# Patient Record
Sex: Female | Born: 1960 | Race: White | Hispanic: No | Marital: Married | Smoking: Former smoker
Health system: Southern US, Community
[De-identification: ages and names within clinical notes are randomized; demographics above are authoritative.]

## PROBLEM LIST (undated history)

## (undated) DIAGNOSIS — R739 Hyperglycemia, unspecified: Secondary | ICD-10-CM

## (undated) DIAGNOSIS — I10 Essential (primary) hypertension: Secondary | ICD-10-CM

## (undated) DIAGNOSIS — I429 Cardiomyopathy, unspecified: Secondary | ICD-10-CM

## (undated) DIAGNOSIS — K631 Perforation of intestine (nontraumatic): Secondary | ICD-10-CM

## (undated) DIAGNOSIS — Z8742 Personal history of other diseases of the female genital tract: Secondary | ICD-10-CM

## (undated) DIAGNOSIS — K219 Gastro-esophageal reflux disease without esophagitis: Secondary | ICD-10-CM

## (undated) DIAGNOSIS — K5909 Other constipation: Secondary | ICD-10-CM

## (undated) DIAGNOSIS — M7989 Other specified soft tissue disorders: Secondary | ICD-10-CM

## (undated) DIAGNOSIS — R200 Anesthesia of skin: Secondary | ICD-10-CM

## (undated) DIAGNOSIS — M317 Microscopic polyangiitis: Secondary | ICD-10-CM

## (undated) DIAGNOSIS — E119 Type 2 diabetes mellitus without complications: Secondary | ICD-10-CM

## (undated) DIAGNOSIS — J9691 Respiratory failure, unspecified with hypoxia: Secondary | ICD-10-CM

## (undated) DIAGNOSIS — I7782 Antineutrophilic cytoplasmic antibody (ANCA) vasculitis: Secondary | ICD-10-CM

## (undated) DIAGNOSIS — E669 Obesity, unspecified: Secondary | ICD-10-CM

## (undated) DIAGNOSIS — R202 Paresthesia of skin: Secondary | ICD-10-CM

## (undated) DIAGNOSIS — G8929 Other chronic pain: Secondary | ICD-10-CM

## (undated) DIAGNOSIS — F418 Other specified anxiety disorders: Secondary | ICD-10-CM

## (undated) DIAGNOSIS — Z9181 History of falling: Secondary | ICD-10-CM

## (undated) DIAGNOSIS — J849 Interstitial pulmonary disease, unspecified: Secondary | ICD-10-CM

## (undated) DIAGNOSIS — K529 Noninfective gastroenteritis and colitis, unspecified: Secondary | ICD-10-CM

## (undated) DIAGNOSIS — K5720 Diverticulitis of large intestine with perforation and abscess without bleeding: Secondary | ICD-10-CM

## (undated) DIAGNOSIS — D72829 Elevated white blood cell count, unspecified: Secondary | ICD-10-CM

## (undated) DIAGNOSIS — R0489 Hemorrhage from other sites in respiratory passages: Secondary | ICD-10-CM

## (undated) DIAGNOSIS — M5441 Lumbago with sciatica, right side: Secondary | ICD-10-CM

## (undated) DIAGNOSIS — R5382 Chronic fatigue, unspecified: Secondary | ICD-10-CM

## (undated) DIAGNOSIS — R5381 Other malaise: Secondary | ICD-10-CM

## (undated) DIAGNOSIS — R042 Hemoptysis: Secondary | ICD-10-CM

## (undated) DIAGNOSIS — E876 Hypokalemia: Secondary | ICD-10-CM

## (undated) DIAGNOSIS — R0600 Dyspnea, unspecified: Secondary | ICD-10-CM

## (undated) DIAGNOSIS — I776 Arteritis, unspecified: Secondary | ICD-10-CM

## (undated) DIAGNOSIS — Z87891 Personal history of nicotine dependence: Secondary | ICD-10-CM

## (undated) DIAGNOSIS — D62 Acute posthemorrhagic anemia: Secondary | ICD-10-CM

## (undated) HISTORY — DX: Respiratory failure, unspecified with hypoxia: J96.91

## (undated) HISTORY — DX: Anesthesia of skin: R20.0

## (undated) HISTORY — DX: Dyspnea, unspecified: R06.00

## (undated) HISTORY — DX: Other constipation: K59.09

## (undated) HISTORY — DX: Hypokalemia: E87.6

## (undated) HISTORY — DX: Paresthesia of skin: R20.2

## (undated) HISTORY — DX: Hyperglycemia, unspecified: R73.9

## (undated) HISTORY — DX: Interstitial pulmonary disease, unspecified: J84.9

## (undated) HISTORY — DX: Perforation of intestine (nontraumatic): K63.1

## (undated) HISTORY — DX: Other specified soft tissue disorders: M79.89

## (undated) HISTORY — DX: Other malaise: R53.81

## (undated) HISTORY — DX: Arteritis, unspecified: I77.6

## (undated) HISTORY — DX: Lumbago with sciatica, right side: M54.41

## (undated) HISTORY — DX: Other chronic pain: G89.29

## (undated) HISTORY — DX: Cardiomyopathy, unspecified: I42.9

## (undated) HISTORY — DX: Personal history of other diseases of the female genital tract: Z87.42

## (undated) HISTORY — DX: Hemoptysis: R04.2

## (undated) HISTORY — DX: Acute posthemorrhagic anemia: D62

## (undated) HISTORY — PX: APPENDECTOMY: SHX54

## (undated) HISTORY — DX: Gastro-esophageal reflux disease without esophagitis: K21.9

## (undated) HISTORY — PX: BLADDER REPAIR: SHX76

## (undated) HISTORY — DX: Chronic fatigue, unspecified: R53.82

## (undated) HISTORY — DX: Antineutrophilic cytoplasmic antibody (ANCA) vasculitis: I77.82

## (undated) HISTORY — PX: TONSILLECTOMY: SUR1361

## (undated) HISTORY — DX: Hemorrhage from other sites in respiratory passages: R04.89

## (undated) HISTORY — DX: Elevated white blood cell count, unspecified: D72.829

---

## 1995-01-10 DIAGNOSIS — Z8742 Personal history of other diseases of the female genital tract: Secondary | ICD-10-CM

## 1995-01-10 HISTORY — PX: LAPAROSCOPY: SHX197

## 1995-01-10 HISTORY — DX: Personal history of other diseases of the female genital tract: Z87.42

## 1997-12-01 ENCOUNTER — Other Ambulatory Visit: Admission: RE | Admit: 1997-12-01 | Discharge: 1997-12-01 | Payer: Self-pay | Admitting: Obstetrics and Gynecology

## 1997-12-11 ENCOUNTER — Emergency Department (HOSPITAL_COMMUNITY): Admission: EM | Admit: 1997-12-11 | Discharge: 1997-12-11 | Payer: Self-pay | Admitting: Emergency Medicine

## 1997-12-12 ENCOUNTER — Encounter: Payer: Self-pay | Admitting: Emergency Medicine

## 1998-04-02 ENCOUNTER — Encounter: Payer: Self-pay | Admitting: *Deleted

## 1998-04-02 ENCOUNTER — Ambulatory Visit (HOSPITAL_COMMUNITY): Admission: RE | Admit: 1998-04-02 | Discharge: 1998-04-02 | Payer: Self-pay | Admitting: *Deleted

## 1998-04-26 ENCOUNTER — Other Ambulatory Visit: Admission: RE | Admit: 1998-04-26 | Discharge: 1998-04-26 | Payer: Self-pay | Admitting: Obstetrics and Gynecology

## 1998-05-11 ENCOUNTER — Ambulatory Visit: Admission: RE | Admit: 1998-05-11 | Discharge: 1998-05-11 | Payer: Self-pay | Admitting: Gynecologic Oncology

## 1998-06-01 ENCOUNTER — Ambulatory Visit: Admission: RE | Admit: 1998-06-01 | Discharge: 1998-06-01 | Payer: Self-pay | Admitting: Gynecologic Oncology

## 1998-07-06 ENCOUNTER — Encounter (INDEPENDENT_AMBULATORY_CARE_PROVIDER_SITE_OTHER): Payer: Self-pay | Admitting: Specialist

## 1998-07-06 ENCOUNTER — Ambulatory Visit (HOSPITAL_COMMUNITY): Admission: RE | Admit: 1998-07-06 | Discharge: 1998-07-06 | Payer: Self-pay | Admitting: Gynecologic Oncology

## 1998-08-17 ENCOUNTER — Ambulatory Visit: Admission: RE | Admit: 1998-08-17 | Discharge: 1998-08-17 | Payer: Self-pay | Admitting: Gynecologic Oncology

## 1998-09-09 ENCOUNTER — Observation Stay (HOSPITAL_COMMUNITY): Admission: RE | Admit: 1998-09-09 | Discharge: 1998-09-10 | Payer: Self-pay | Admitting: Urology

## 1999-10-11 ENCOUNTER — Other Ambulatory Visit: Admission: RE | Admit: 1999-10-11 | Discharge: 1999-10-11 | Payer: Self-pay | Admitting: Obstetrics and Gynecology

## 1999-11-07 ENCOUNTER — Ambulatory Visit (HOSPITAL_COMMUNITY): Admission: RE | Admit: 1999-11-07 | Discharge: 1999-11-07 | Payer: Self-pay | Admitting: Urology

## 2000-04-28 ENCOUNTER — Encounter (INDEPENDENT_AMBULATORY_CARE_PROVIDER_SITE_OTHER): Payer: Self-pay

## 2000-04-28 ENCOUNTER — Ambulatory Visit (HOSPITAL_COMMUNITY): Admission: RE | Admit: 2000-04-28 | Discharge: 2000-04-28 | Payer: Self-pay | Admitting: Obstetrics and Gynecology

## 2000-04-28 HISTORY — PX: LAPAROSCOPY: SHX197

## 2000-07-27 ENCOUNTER — Ambulatory Visit: Admission: RE | Admit: 2000-07-27 | Discharge: 2000-07-27 | Payer: Self-pay | Admitting: Internal Medicine

## 2000-07-27 ENCOUNTER — Encounter (INDEPENDENT_AMBULATORY_CARE_PROVIDER_SITE_OTHER): Payer: Self-pay | Admitting: Specialist

## 2000-12-17 ENCOUNTER — Encounter: Payer: Self-pay | Admitting: Otolaryngology

## 2000-12-17 ENCOUNTER — Ambulatory Visit (HOSPITAL_COMMUNITY): Admission: RE | Admit: 2000-12-17 | Discharge: 2000-12-17 | Payer: Self-pay | Admitting: Otolaryngology

## 2001-01-03 ENCOUNTER — Other Ambulatory Visit: Admission: RE | Admit: 2001-01-03 | Discharge: 2001-01-03 | Payer: Self-pay | Admitting: Obstetrics and Gynecology

## 2008-10-27 ENCOUNTER — Emergency Department (HOSPITAL_COMMUNITY): Admission: EM | Admit: 2008-10-27 | Discharge: 2008-10-27 | Payer: Self-pay | Admitting: Emergency Medicine

## 2010-05-27 NOTE — Op Note (Signed)
[facility]  Patient:    [name], [name]                      MRN: [id number] Proc. Date: 11/07/99 Adm. Date:  [id number] Attending:  Veverly Fells                           Operative Report  PREOPERATIVE DIAGNOSIS:  Bilateral vaginal suture eversion .  POSTOPERATIVE DIAGNOSIS:  Bilateral vaginal suture eversion.  PROCEDURE:  Excision, bilateral vaginal Prolene sutures.  SURGEON:  Sigmund I. Gaynelle Arabian, M.D.  ANESTHESIA:  General (LMA).  PREOPERATIVE PREPARATION:  After appropriate pre-anesthesia, the patient was brought to the operating room and placed on the operating table in a dorsal supine position.  She was then given general anesthesia, placed in the dorsolithotomy position where the pubis was prepped with Betadine solution and draped in the usual fashion.  DESCRIPTION OF PROCEDURE:  The identification of two Prolene sutures were identified, and the two portions of Prolene suture were noted to eject through the previous vaginal incision, approximately 1 cm.  These were grasped, extended, and cut at the base.  There was no evidence of any bleeding or suture.  The right side was cut in the same fashion.  There was minimal bleeding from the right side.  This was washed and was felt to not be worthy of needing to have a suture placed.  The patient was awakened and taken to recovery in good condition. DD:  11/07/99 TD:  11/07/99 Job: [id number]

## 2010-05-27 NOTE — Op Note (Signed)
[facility]  Patient:    [name], [name]                      MRN: [id number] Proc. Date: 07/27/00 Adm. Date:  [id number] Attending:  Arturo Morton CC:         Tammy R. Modena Morrow, M.D.   Operative Report  PROCEDURE:  Fiberoptic bronchoscopy, diagnostic, with lavage.  INDICATIONS FOR PROCEDURE:  [name] is a 50 year old white female smoker with refractory cough that has been unresponsive to outpatient measures. Because of the barking upper airway component to the cough, I thought initially it might be reflux related but she has not responded to high dose PPI therapy as well as heavy suppression. She has not been able, however, to quit smoking. Her chest x-ray and sinus CT scans have failed to reveal a cause for the cough. The patient therefore agreed to the procedure after a full discussion of the risks, benefits, and alternatives ______ in the office this past week. Please see dictated notes for further information.  DESCRIPTION OF PROCEDURE:  The right naris and oropharynx were liberally anesthetized with 1% lidocaine aerosol and the right naris was additionally prepared with 2% lidocaine jelly. Using a standard flexible fiberoptic bronchoscope, the right naris was easily cannulated with good visualization of her entire oropharynx and larynx. The cords moved normally. There was definite abnormality of the vocal cord with mild diffuse swelling and possibility early leukoplakia as well as the anterior portion of the left vocal cord. However, the airway was otherwise unremarkable.  Using an additional 1% lidocaine as needed, the entire tracheobronchial tree was explored bilaterally to the subsegmental level with the following findings:  There were no endobronchial lesions. There was no evidence of significant inflammation nor any excessive secretions.  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 I  suspect that this is related to both coughing and smoking. It is critical therefore that this patient commit to quit smoking before further pulmonary workup of her cough is considered and that she receive ENT follow-up within the next 6 to 12 weeks. The patient tolerated the procedure well. I did turn in samples for cytology to be completed but I did not feel any secretions were purulent and therefore I did not turn in any studies to the micro lab. DD:  07/27/00 TD:  07/27/00 Job: [id number]

## 2010-05-27 NOTE — Op Note (Signed)
[facility]  Patient:    [name], [name]                      MRN: [id number] Proc. Date: 04/28/00 Adm. Date:  [id number] Disc. Date: [id number] Attending:  Eber Hong Ii                           Operative Report  PREOPERATIVE DIAGNOSIS:       Menorrhagia.  Pelvic pain.  POSTOPERATIVE DIAGNOSIS:      Menorrhagia.  Pelvic adhesions.  OPERATION:                    Laparoscopy with lysis of adhesions, hysteroscopy, D&C.  SURGEON:                      Daleen Bo. Lyn Hollingshead, M.D.  ASSISTANT:  ANESTHESIA:                   General with endotracheal intubation, Loren Racer. Charlean Merl, M.D.  ESTIMATED BLOOD LOSS:         50 cc.  SORBITOL:                     I&Os even.  INDICATIONS:                  The patient is a 50 year old married white female, gravida 0, para 0, with increasing pelvic pain and menorrhagia. Details are dictated in the history and physical.  Laparoscopy, hysteroscopy, D&C is discussed with the patient.  Possible risks and complications are discussed including, but not limited to infection, bowel, bladder, or ureteral damage, bleeding requiring transfusion of blood products with possible transfusion reaction, HIV, and hepatitis acquisition, DVT, PE, pneumonia, referred shoulder pain, bruising, vaginal bleeding.  All questions are answered and consent is signed and on the chart.  FINDINGS:                     Upper abdomen appears grossly normal.  There are multiple adhesions of the omentum to the right anterior abdominal wall.  The uterus is normal in size and contour.  The left tube and ovary are normal. There are adhesions of the epiploica to the right ovary.  The right fallopian tube is densely adherent to the right ovary.  There is a single shallow pseudowindow formation medial to the right uterosacral ligament with no obvious endometriosis implants.  The uterine cavity is normal in contour with no abnormal vessels or  structures.  DESCRIPTION OF PROCEDURE:     The patient is taken to the operating room and placed in the dorsal supine position where general anesthesia is induced via endotracheal intubation.  She is then placed in the dorsal lithotomy position where she is prepped abdominally and vaginally.  The bladder is straight catheterized and she is draped in a sterile fashion.  Bivalve speculum was placed in the vagina.  The anterior cervical lip was grasped with a single tooth tenaculum and the cervix was gently and progressively dilated up to a 29 Pratt dilator.  Diagnostic hysteroscope was placed in the cervix and advanced under direct visualization using Sorbitol distending media.  The above findings were noted.  The hysteroscope was removed and sharp curettage is carried out.  The single tooth tenaculum is replaced with the Hulka tenaculum. Attention is then turned to the abdomen.  A  small infraumbilical incision is made.  The abdominal wall is grasped lateral to the umbilicus on either side with towel clips to properly elevate the abdomen.  A #12 disposable long laparoscopic trocar is placed on the first attempt without difficulty. Placement is verified with the laparoscope and no damage to the surrounding structures is noted.  The pneumoperitoneum is induced.  A small suprapubic incision is made and a 5 mm nondisposable trocar sleeve is placed under direct visualization without difficulty.  The above findings are noted.  The adhesions to the right anterior abdominal wall are taken down sharply without difficulty.  Minimal bipolar cautery is used to assure hemostasis on the anterior abdominal wall.  The adhesions to the right ovary are taken down sharply and bluntly without difficulty.  The base of the pseudowindow in the posterior cul-de-sac is ablated with bipolar cautery.  Copious irrigation is carried out and all returns as clear.  Interceed is backloaded through the laparoscope and  placed around the right ovary as well as on the site of adhesion dissection on the right anterior abdominal wall.  These are slightly moistened.  Excess fluid is removed.  The suprapubic trocar sleeve is removed. The pneumoperitoneum is reduced and no bleeding is noted from any site. The pneumoperitoneum is then completely removed and the umbilical trocar sleeve is removed.  The umbilical incision is closed with a 0 Vicryl in the deeper underlying layers with care being taken not to pick up any underlying structures.  The skin incisions are closed with subcuticular 3-0 Vicryl suture.  The incisions are injected with 0.5% plain Marcaine.  Hulka tenaculum is removed and no bleeding is noted.  All counts are correct.  The patient is awakened and taken to the recovery room in stable condition. DD:  04/28/00 TD:  04/30/00 Job: [id number]

## 2010-05-27 NOTE — H&P (Signed)
[facility]  Patient:    [name], [name]                        MRN: [id number] Adm. Date:  04/28/00 Attending:  Daleen Bo. Lyn Hollingshead, M.D.                         History and Physical  CHIEF COMPLAINT:              Heavy vaginal bleeding and pelvic pain.  HISTORY OF PRESENT ILLNESS:   This patient is a 50 year old married white female, G0, P0, with a known history of endometriosis diagnosed on laparoscopy in 1997.  She has progressively worsening pelvic pain and dysmenorrhea.  She also has increasingly heavy irregular vaginal bleeding.  Evaluation has included pelvic ultrasound on March 23, 2000, revealing a normal size uterus. Ovaries are normal as well.  A 6 mm endometrial stripe was noted.  The patients pain is becoming quite severe and is interfering with her work.  She has required narcotics for management.  After a discussion of the options of management, she is being admitted for laparoscopy, hysteroscopy, and D&C.  PAST MEDICAL HISTORY:         1. Recurrent vulvar intraepithelial neoplasia.                               2. Endometriosis.                               3. Depression with anxiety disorder.                               4. History of stress urinary incontinence.                               5. History of cervical spine trauma.                               6. History of constipation.                               7. History of hypertension.  PAST SURGICAL HISTORY:        1. Pubovaginal sling in 2000.                               2. Local excision of anterior vulvar lesion for                                  recurrent vulvar neoplasia in 2000.                               3. Laparoscopy with lysis of adhesions and                                  ablation of endometriosis in 1997.  4. Appendectomy and right ovarian cystectomy in                                  1991.  MEDICATIONS:                   1. Provera 20 mg daily.                               2. Effexor XR 75 mg daily.                               3. Ambien 10 mg q.h.s. p.r.n.  ALLERGIES:                    SULFA DRUGS.  SOCIAL HISTORY:               The patient smokes one to one and one-half packages of cigarettes a day, denies alcohol or drug abuse.  OBSTETRICAL HISTORY:          Negative.  FAMILY HISTORY:               Positive for adult onset diabetes mellitus in father, birth defect in cousin, chronic hypertension in father.  REVIEW OF SYSTEMS:            Negative except as above.  PHYSICAL EXAMINATION:  VITAL SIGNS:                  Height 5 feet 8 inches.  Weight 307 pounds. Blood pressure 136/80.  NECK:                         Without thyromegaly.  LUNGS:                        Clear to auscultation.  HEART:                        Regular rate and rhythm.  BACK:                         Without CVA tenderness.  BREASTS:                      Without mass, retraction, or discharge.  ABDOMEN:                      Obese, soft, nontender, without masses.  PELVIC:                       Vulva, vagina, cervix without lesion.  Uterus normal size.  Adnexa mildly tender without masses bilaterally.  Exam compromised by patient habitus.  EXTREMITIES:                  Grossly within normal limits.  NEUROLOGIC:                   Grossly within normal limits.  ASSESSMENT:                   Endometriosis with pelvic pain and dysmenorrhea and menometrorrhagia.  PLAN:  Hysteroscopy, D&C, and laparoscopy. DD:  04/26/00 TD:  04/26/00 Job: [id number]

## 2012-09-17 ENCOUNTER — Emergency Department (HOSPITAL_COMMUNITY)
Admission: EM | Admit: 2012-09-17 | Discharge: 2012-09-17 | Disposition: A | Discharge status: Home / Self Care | Payer: 59 | Attending: Emergency Medicine | Admitting: Emergency Medicine

## 2012-09-17 ENCOUNTER — Emergency Department (HOSPITAL_COMMUNITY): Payer: 59

## 2012-09-17 ENCOUNTER — Encounter (HOSPITAL_COMMUNITY): Payer: Self-pay | Admitting: Emergency Medicine

## 2012-09-17 DIAGNOSIS — K529 Noninfective gastroenteritis and colitis, unspecified: Secondary | ICD-10-CM

## 2012-09-17 DIAGNOSIS — F411 Generalized anxiety disorder: Secondary | ICD-10-CM | POA: Insufficient documentation

## 2012-09-17 DIAGNOSIS — R61 Generalized hyperhidrosis: Secondary | ICD-10-CM | POA: Insufficient documentation

## 2012-09-17 DIAGNOSIS — E669 Obesity, unspecified: Secondary | ICD-10-CM | POA: Insufficient documentation

## 2012-09-17 DIAGNOSIS — R630 Anorexia: Secondary | ICD-10-CM | POA: Insufficient documentation

## 2012-09-17 DIAGNOSIS — R000 Tachycardia, unspecified: Secondary | ICD-10-CM | POA: Insufficient documentation

## 2012-09-17 DIAGNOSIS — Z79899 Other long term (current) drug therapy: Secondary | ICD-10-CM | POA: Insufficient documentation

## 2012-09-17 DIAGNOSIS — K5289 Other specified noninfective gastroenteritis and colitis: Secondary | ICD-10-CM | POA: Insufficient documentation

## 2012-09-17 DIAGNOSIS — F3289 Other specified depressive episodes: Secondary | ICD-10-CM | POA: Insufficient documentation

## 2012-09-17 DIAGNOSIS — K5900 Constipation, unspecified: Secondary | ICD-10-CM

## 2012-09-17 DIAGNOSIS — F329 Major depressive disorder, single episode, unspecified: Secondary | ICD-10-CM | POA: Insufficient documentation

## 2012-09-17 DIAGNOSIS — R112 Nausea with vomiting, unspecified: Secondary | ICD-10-CM | POA: Insufficient documentation

## 2012-09-17 DIAGNOSIS — R109 Unspecified abdominal pain: Secondary | ICD-10-CM | POA: Insufficient documentation

## 2012-09-17 LAB — CBC WITH DIFFERENTIAL/PLATELET
Eosinophils Absolute: 0.1 10*3/uL (ref 0.0–0.7)
HCT: 41 % (ref 36.0–46.0)
Lymphs Abs: 1.6 10*3/uL (ref 0.7–4.0)
MCHC: 32.9 g/dL (ref 30.0–36.0)
MCV: 78.1 fL (ref 78.0–100.0)
Monocytes Relative: 4 % (ref 3–12)
Platelets: 421 10*3/uL — ABNORMAL HIGH (ref 150–400)
RDW: 14 % (ref 11.5–15.5)
WBC: 19.5 10*3/uL — ABNORMAL HIGH (ref 4.0–10.5)

## 2012-09-17 LAB — COMPREHENSIVE METABOLIC PANEL
Alkaline Phosphatase: 134 U/L — ABNORMAL HIGH (ref 39–117)
BUN: 12 mg/dL (ref 6–23)
CO2: 22 mEq/L (ref 19–32)
Chloride: 94 mEq/L — ABNORMAL LOW (ref 96–112)
GFR calc Af Amer: >90 mL/min (ref >90)
GFR calc non Af Amer: 79 mL/min — ABNORMAL LOW (ref >90)
Glucose, Bld: 123 mg/dL — ABNORMAL HIGH (ref 70–99)
Potassium: 4.3 mEq/L (ref 3.5–5.1)
Total Bilirubin: 0.7 mg/dL (ref 0.3–1.2)
Total Protein: 8.4 g/dL — ABNORMAL HIGH (ref 6.0–8.3)

## 2012-09-17 LAB — LIPASE, BLOOD: Lipase: 11 U/L (ref 11–59)

## 2012-09-17 MED ORDER — IOHEXOL 300 MG/ML  SOLN
125.0000 mL | Freq: Once | INTRAMUSCULAR | Status: AC | PRN
  Administered 2012-09-17: 125 mL via INTRAVENOUS

## 2012-09-17 MED ORDER — LORAZEPAM 2 MG/ML IJ SOLN
1.0000 mg | Freq: Once | INTRAMUSCULAR | Status: AC
  Administered 2012-09-17: 1 mg via INTRAVENOUS
  Filled 2012-09-17: qty 1

## 2012-09-17 MED ORDER — CIPROFLOXACIN HCL 500 MG PO TABS: 500.0000 mg | ORAL_TABLET | Freq: Two times a day (BID) | ORAL | Status: DC

## 2012-09-17 MED ORDER — SODIUM CHLORIDE 0.9 % IV BOLUS (SEPSIS)
1000.0000 mL | Freq: Once | INTRAVENOUS | Status: AC
  Administered 2012-09-17: 1000 mL via INTRAVENOUS

## 2012-09-17 MED ORDER — HYDROMORPHONE HCL PF 1 MG/ML IJ SOLN
1.0000 mg | Freq: Once | INTRAMUSCULAR | Status: AC
  Administered 2012-09-17: 1 mg via INTRAVENOUS
  Filled 2012-09-17: qty 1

## 2012-09-17 MED ORDER — METRONIDAZOLE 500 MG PO TABS: 500.0000 mg | ORAL_TABLET | Freq: Two times a day (BID) | ORAL | Status: DC

## 2012-09-17 MED ORDER — MILK AND MOLASSES ENEMA
Freq: Once | RECTAL | Status: AC
  Administered 2012-09-17: 15:00:00 via RECTAL
  Filled 2012-09-17: qty 250

## 2012-09-17 MED ORDER — IOHEXOL 300 MG/ML  SOLN
50.0000 mL | Freq: Once | INTRAMUSCULAR | Status: AC | PRN
  Administered 2012-09-17: 50 mL via ORAL

## 2012-09-17 NOTE — ED Notes (Signed)
Pt c/o constipation since Aug 29.  Has been taking miralax with no relief.  Gen abd pain.  Pt has very detailed notes explaining her symptoms.  Very.  Detailed.

## 2012-09-17 NOTE — ED Notes (Signed)
Patient is menstruating heavily, urine sample must be collected through straight catheter.

## 2012-09-17 NOTE — ED Notes (Signed)
Patient transported to CT 

## 2012-09-17 NOTE — ED Provider Notes (Signed)
Medical screening examination/treatment/procedure(s) were performed by non-physician practitioner and as supervising physician I was immediately available for consultation/collaboration. Rolland Porter, MD, Abram Sander   Janice Norrie, MD 09/17/12 [id number]

## 2012-09-17 NOTE — Progress Notes (Signed)
Patient confirms her pcp is Dr. [name] of [facility].

## 2012-09-17 NOTE — ED Notes (Signed)
Patient transported to X-ray 

## 2012-09-17 NOTE — ED Notes (Signed)
IV insertion attempted without success. IV therapy paged.

## 2012-09-17 NOTE — ED Provider Notes (Signed)
CSN: [id number]     Arrival date & time 09/17/12  [id number] History   First MD Initiated Contact with Patient 09/17/12 1049     Chief Complaint  Patient presents with  . Constipation   (Consider location/radiation/quality/duration/timing/severity/associated sxs/prior Treatment) HPI Comments: 52 year old female the past history of anxiety and depression presents to the emergency department complaining of constipation times one and half weeks since August 29. She has tried multiple over-the-counter laxatives, stool softeners and enemas without relief. She went to her PCP who suggested laxatives. She's been trying to change her diet to eat more salad. Admits to associated generalized abdominal pain worsening over the past week and half. One week ago she changed her diet to a clear liquid diet, a few days ago became nauseous and began vomiting, has been unable to keep anything down. States she is very anxious and sweating. No recent abdominal surgeries. Also states she's had blood which she believes is from her rectum, however is on her menstrual period and states it is heavy.  Patient is a 52 y.o. female presenting with constipation. The history is provided by the patient.  Constipation Associated symptoms: abdominal pain, nausea and vomiting   Associated symptoms: no back pain and no fever     History reviewed. No pertinent past medical history. History reviewed. No pertinent past surgical history. History reviewed. No pertinent family history. History  Substance Use Topics  . Smoking status: Never Smoker   . Smokeless tobacco: Not on file  . Alcohol Use: No   OB History   Grav Para Term Preterm Abortions TAB SAB Ect Mult Living                 Review of Systems  Constitutional: Positive for diaphoresis and appetite change. Negative for fever and chills.  Gastrointestinal: Positive for nausea, vomiting, abdominal pain, constipation and blood in stool.  Musculoskeletal: Negative for back  pain.  All other systems reviewed and are negative.    Allergies  Sulfa antibiotics  Home Medications   Current Outpatient Rx  Name  Route  Sig  Dispense  Refill  . ALPRAZolam (XANAX) 1 MG tablet   Oral   Take 1 mg by mouth 3 (three) times daily as needed.          . bisacodyl (DULCOLAX) 5 MG EC tablet   Oral   Take 5 mg by mouth daily as needed for constipation.         Marland Kitchen buPROPion (WELLBUTRIN XL) 300 MG 24 hr tablet   Oral   Take 300 mg by mouth daily.          . citalopram (CELEXA) 20 MG tablet   Oral   Take 40 mg by mouth daily.         Marland Kitchen docusate sodium (COLACE) 100 MG capsule   Oral   Take 100 mg by mouth 2 (two) times daily as needed for constipation.         . ORTHO-CYCLEN, 28, 0.25-35 MG-MCG tablet   Oral   Take 1 tablet by mouth daily.          . phentermine 37.5 MG capsule   Oral   Take 37.5 mg by mouth every morning.         . polyethylene glycol (MIRALAX / GLYCOLAX) packet   Oral   Take 17 g by mouth daily as needed (constipation).         . Probiotic Product (ALIGN) 4 MG CAPS   Oral  Take 4 mg by mouth daily as needed (constipation).         . sodium phosphate (FLEET) enema   Rectal   Place 1 enema rectally once. follow package directions          BP 137/69  Pulse 111  Temp(Src) 98.7 F (37.1 C) (Oral)  Resp 16  SpO2 98%  LMP 09/04/2012 Physical Exam  Nursing note and vitals reviewed. Constitutional: She is oriented to person, place, and time. She appears well-developed. No distress.  Obese   HENT:  Head: Normocephalic and atraumatic.  Eyes: Conjunctivae are normal.  Neck: Normal range of motion. Neck supple.  Cardiovascular: Regular rhythm and normal heart sounds.  Tachycardia present.   Pulmonary/Chest: Effort normal and breath sounds normal. No respiratory distress.  Abdominal: Soft. Bowel sounds are normal. There is tenderness. There is guarding. There is no rigidity and no rebound.  Obese. Generalized  tenderness throughout abdomen, worse lower with guarding.  Genitourinary:  Menstrual blood noted around rectum. No gross blood on exam glove from rectal, however specimen contaminated by menstrual blood.  Musculoskeletal: Normal range of motion. She exhibits no edema.  Neurological: She is alert and oriented to person, place, and time.  Skin: Skin is warm. She is not diaphoretic.  Psychiatric: Her mood appears anxious.  Tearful    ED Course  Procedures (including critical care time) Labs Review Labs Reviewed  CBC WITH DIFFERENTIAL - Abnormal; Notable for the following:    WBC 19.5 (*)    RBC 5.25 (*)    MCH 25.7 (*)    Platelets 421 (*)    Neutrophils Relative % 87 (*)    Neutro Abs 17.0 (*)    Lymphocytes Relative 8 (*)    All other components within normal limits  COMPREHENSIVE METABOLIC PANEL - Abnormal; Notable for the following:    Sodium 131 (*)    Chloride 94 (*)    Glucose, Bld 123 (*)    Total Protein 8.4 (*)    Alkaline Phosphatase 134 (*)    GFR calc non Af Amer 79 (*)    All other components within normal limits  LIPASE, BLOOD   Imaging Review Ct Abdomen Pelvis W Contrast  09/17/2012   *RADIOLOGY REPORT*  Clinical Data: Abdominal pain, constipation, dysmenorrhea, prior appendectomy  CT ABDOMEN AND PELVIS WITH CONTRAST  Technique:  Multidetector CT imaging of the abdomen and pelvis was performed following the standard protocol during bolus administration of intravenous contrast.  Contrast: 125 ml Isovue 300 IV  Comparison: None.  Findings: Bases are clear.  Liver, spleen, pancreas, and adrenal glands within normal limits.  Gallbladder is mildly distended but otherwise unremarkable.  No intrahepatic or extrahepatic ductal dilatation.  Kidneys within normal limits.  No hydronephrosis.  No evidence of bowel obstruction.  Prior appendectomy.  Moderate colonic stool burden, predominately in the right colon and sigmoid. Mild mesenteric inflammatory stranding along the sigmoid  colon, worrisome for superimposed colitis.  No drainable fluid collection/abscess.  No free air.  No evidence of abdominal aortic aneurysm.  Small retroperitoneal lymph nodes which do not meet pathologic CT size criteria. Additional small nodes along the sigmoid mesocolon measuring up to 7 mm short axis.  Trace pelvic ascites.  Uterus and bilateral ovaries are unremarkable.  Bladder is within normal limits.  Mild degenerative changes of the visualized thoracolumbar spine.  IMPRESSION: Moderate colonic stool burden, suggesting constipation.  Mild pericolonic inflammatory stranding along the sigmoid colon, worrisome for superimposed colitis.  No drainable fluid  collection/abscess.  No free air.   Original Report Authenticated By: Julian Hy, M.D.   Dg Abd Acute W/chest  09/17/2012   *RADIOLOGY REPORT*  Clinical Data: Abdominal pain, constipation  ACUTE ABDOMEN SERIES (ABDOMEN 2 VIEW & CHEST 1 VIEW)  Comparison: None  Findings: Cardiomediastinal silhouette is unremarkable.  No acute infiltrate or pleural effusion.  No pulmonary edema.  There is nonobstructive bowel gas pattern.  Significant  stool noted in the right colon and transverse colon. Mild gaseous distended small bowel loops left abdomen probable mild ileus. No free abdominal air.  IMPRESSION: No acute disease within chest. No free abdominal air.  Significant stool is noted within the right colon and transverse colon.  Mild gaseous distended bowel loops left abdomen probable mild ileus.   Original Report Authenticated By: Lahoma Crocker, M.D.    MDM   1. Colitis   2. Constipation     Vision presenting with constipation, nausea and abdominal pain. She is afebrile, appears anxious, she presented with a long list of events over the past week. Labs obtained in triage prior to patient being seen, CBC, CMP and lipase. Leukocytosis of 19.5. Alkaline phosphatase 134. Generalized tenderness on exam. Acute abdomen series obtained due to concern for all  traction, significant stool noted, mild gaseous distended bowel loops in the left abdomen with a probable mild ileus. CT scan obtained, results showing moderate colonic stool burden, inflammatory stranding along sigmoid colon, consistent with colitis, no abscess or free air. After receiving pain medication, patient is no longer in distress, very talkative. No longer tachycardic.  Milk of molasses enema given with mild relief of her constipation. Patient is stable for discharge, and reevaluation of her abdomen, abdomen is soft, nontender nondistended. Discharged with Cipro and Flagyl for colitis, advised MiraLax and stool softeners for constipation. High fiber diet discussed. Return precautions discussed. Patient states understanding of plan and is agreeable  Illene Labrador, PA-C 09/17/12 1556

## 2012-09-23 ENCOUNTER — Emergency Department (HOSPITAL_COMMUNITY): Payer: 59

## 2012-09-23 ENCOUNTER — Inpatient Hospital Stay (HOSPITAL_COMMUNITY)
Admission: EM | Admit: 2012-09-23 | Discharge: 2012-10-05 | DRG: 394 | Disposition: A | Discharge status: Home / Self Care | Payer: 59 | Attending: General Surgery | Admitting: General Surgery

## 2012-09-23 ENCOUNTER — Encounter (HOSPITAL_COMMUNITY): Payer: Self-pay

## 2012-09-23 DIAGNOSIS — Z9181 History of falling: Secondary | ICD-10-CM

## 2012-09-23 DIAGNOSIS — Z6841 Body Mass Index (BMI) 40.0 and over, adult: Secondary | ICD-10-CM

## 2012-09-23 DIAGNOSIS — F418 Other specified anxiety disorders: Secondary | ICD-10-CM | POA: Diagnosis present

## 2012-09-23 DIAGNOSIS — R1032 Left lower quadrant pain: Secondary | ICD-10-CM | POA: Diagnosis present

## 2012-09-23 DIAGNOSIS — K219 Gastro-esophageal reflux disease without esophagitis: Secondary | ICD-10-CM | POA: Diagnosis present

## 2012-09-23 DIAGNOSIS — E46 Unspecified protein-calorie malnutrition: Secondary | ICD-10-CM | POA: Diagnosis present

## 2012-09-23 DIAGNOSIS — K631 Perforation of intestine (nontraumatic): Principal | ICD-10-CM | POA: Diagnosis present

## 2012-09-23 DIAGNOSIS — K5900 Constipation, unspecified: Secondary | ICD-10-CM

## 2012-09-23 DIAGNOSIS — F329 Major depressive disorder, single episode, unspecified: Secondary | ICD-10-CM | POA: Diagnosis present

## 2012-09-23 DIAGNOSIS — F411 Generalized anxiety disorder: Secondary | ICD-10-CM | POA: Diagnosis present

## 2012-09-23 DIAGNOSIS — K5909 Other constipation: Secondary | ICD-10-CM | POA: Diagnosis present

## 2012-09-23 DIAGNOSIS — Z8719 Personal history of other diseases of the digestive system: Secondary | ICD-10-CM

## 2012-09-23 DIAGNOSIS — R109 Unspecified abdominal pain: Secondary | ICD-10-CM

## 2012-09-23 DIAGNOSIS — K5732 Diverticulitis of large intestine without perforation or abscess without bleeding: Secondary | ICD-10-CM

## 2012-09-23 DIAGNOSIS — F3289 Other specified depressive episodes: Secondary | ICD-10-CM | POA: Diagnosis present

## 2012-09-23 DIAGNOSIS — E669 Obesity, unspecified: Secondary | ICD-10-CM | POA: Diagnosis present

## 2012-09-23 DIAGNOSIS — K529 Noninfective gastroenteritis and colitis, unspecified: Secondary | ICD-10-CM

## 2012-09-23 DIAGNOSIS — Z87891 Personal history of nicotine dependence: Secondary | ICD-10-CM

## 2012-09-23 DIAGNOSIS — R112 Nausea with vomiting, unspecified: Secondary | ICD-10-CM | POA: Diagnosis present

## 2012-09-23 DIAGNOSIS — K5720 Diverticulitis of large intestine with perforation and abscess without bleeding: Secondary | ICD-10-CM

## 2012-09-23 DIAGNOSIS — Z803 Family history of malignant neoplasm of breast: Secondary | ICD-10-CM

## 2012-09-23 HISTORY — DX: Noninfective gastroenteritis and colitis, unspecified: K52.9

## 2012-09-23 HISTORY — DX: Personal history of nicotine dependence: Z87.891

## 2012-09-23 HISTORY — DX: History of falling: Z91.81

## 2012-09-23 HISTORY — DX: Diverticulitis of large intestine with perforation and abscess without bleeding: K57.20

## 2012-09-23 HISTORY — DX: Obesity, unspecified: E66.9

## 2012-09-23 HISTORY — DX: Other specified anxiety disorders: F41.8

## 2012-09-23 LAB — URINALYSIS, ROUTINE W REFLEX MICROSCOPIC
Glucose, UA: NEGATIVE mg/dL
Protein, ur: NEGATIVE mg/dL
pH: 6 (ref 5.0–8.0)

## 2012-09-23 LAB — CBC WITH DIFFERENTIAL/PLATELET
Basophils Absolute: 0.1 10*3/uL (ref 0.0–0.1)
Basophils Relative: 0 % (ref 0–1)
Eosinophils Absolute: 0.2 10*3/uL (ref 0.0–0.7)
MCHC: 32.6 g/dL (ref 30.0–36.0)
Neutro Abs: 10.3 10*3/uL — ABNORMAL HIGH (ref 1.7–7.7)
Neutrophils Relative %: 78 % — ABNORMAL HIGH (ref 43–77)
Platelets: 419 10*3/uL — ABNORMAL HIGH (ref 150–400)
RDW: 13.7 % (ref 11.5–15.5)

## 2012-09-23 LAB — COMPREHENSIVE METABOLIC PANEL
AST: 15 U/L (ref 0–37)
Albumin: 3 g/dL — ABNORMAL LOW (ref 3.5–5.2)
Alkaline Phosphatase: 111 U/L (ref 39–117)
Chloride: 101 mEq/L (ref 96–112)
Creatinine, Ser: 0.71 mg/dL (ref 0.50–1.10)
Potassium: 4.1 mEq/L (ref 3.5–5.1)
Total Bilirubin: 0.2 mg/dL — ABNORMAL LOW (ref 0.3–1.2)
Total Protein: 7.8 g/dL (ref 6.0–8.3)

## 2012-09-23 MED ORDER — IOHEXOL 300 MG/ML  SOLN
50.0000 mL | Freq: Once | INTRAMUSCULAR | Status: AC | PRN
  Administered 2012-09-23: 50 mL via ORAL

## 2012-09-23 MED ORDER — KCL-LACTATED RINGERS-D5W 20 MEQ/L IV SOLN
INTRAVENOUS | Status: DC
  Administered 2012-09-23 – 2012-09-24 (×2): via INTRAVENOUS
  Administered 2012-09-24: 125 mL via INTRAVENOUS
  Administered 2012-09-25: 100 mL/h via INTRAVENOUS
  Administered 2012-09-25 – 2012-09-27 (×3): via INTRAVENOUS
  Filled 2012-09-23 (×12): qty 1000

## 2012-09-23 MED ORDER — IOHEXOL 300 MG/ML  SOLN
100.0000 mL | Freq: Once | INTRAMUSCULAR | Status: AC | PRN
  Administered 2012-09-23: 100 mL via INTRAVENOUS

## 2012-09-23 MED ORDER — CITALOPRAM HYDROBROMIDE 40 MG PO TABS
40.0000 mg | ORAL_TABLET | Freq: Every day | ORAL | Status: DC
  Administered 2012-09-24 – 2012-10-05 (×12): 40 mg via ORAL
  Filled 2012-09-23 (×13): qty 1

## 2012-09-23 MED ORDER — MORPHINE SULFATE 2 MG/ML IJ SOLN
2.0000 mg | INTRAMUSCULAR | Status: DC | PRN
  Administered 2012-09-23: 2 mg via INTRAVENOUS
  Administered 2012-09-24 – 2012-09-25 (×8): 4 mg via INTRAVENOUS
  Administered 2012-09-25: 2 mg via INTRAVENOUS
  Administered 2012-09-25 – 2012-09-26 (×11): 4 mg via INTRAVENOUS
  Administered 2012-09-27: 2 mg via INTRAVENOUS
  Administered 2012-09-27: 4 mg via INTRAVENOUS
  Administered 2012-09-27: 2 mg via INTRAVENOUS
  Administered 2012-09-27 (×3): 4 mg via INTRAVENOUS
  Administered 2012-09-27: 2 mg via INTRAVENOUS
  Administered 2012-09-27 – 2012-09-29 (×10): 4 mg via INTRAVENOUS
  Administered 2012-10-01: 2 mg via INTRAVENOUS
  Filled 2012-09-23 (×6): qty 2
  Filled 2012-09-23: qty 1
  Filled 2012-09-23 (×3): qty 2
  Filled 2012-09-23: qty 1
  Filled 2012-09-23 (×8): qty 2
  Filled 2012-09-23: qty 1
  Filled 2012-09-23 (×2): qty 2
  Filled 2012-09-23: qty 1
  Filled 2012-09-23 (×2): qty 2
  Filled 2012-09-23: qty 1
  Filled 2012-09-23 (×5): qty 2
  Filled 2012-09-23: qty 1
  Filled 2012-09-23: qty 2
  Filled 2012-09-23: qty 1
  Filled 2012-09-23: qty 2
  Filled 2012-09-23: qty 1
  Filled 2012-09-23 (×5): qty 2

## 2012-09-23 MED ORDER — ONDANSETRON HCL 4 MG/2ML IJ SOLN
4.0000 mg | Freq: Four times a day (QID) | INTRAMUSCULAR | Status: DC | PRN
  Administered 2012-09-24 – 2012-09-27 (×7): 4 mg via INTRAVENOUS
  Filled 2012-09-23 (×10): qty 2

## 2012-09-23 MED ORDER — BUPROPION HCL ER (XL) 300 MG PO TB24
300.0000 mg | ORAL_TABLET | Freq: Every day | ORAL | Status: DC
  Administered 2012-09-24 – 2012-10-05 (×12): 300 mg via ORAL
  Filled 2012-09-23 (×13): qty 1

## 2012-09-23 MED ORDER — PANTOPRAZOLE SODIUM 40 MG IV SOLR
40.0000 mg | Freq: Every day | INTRAVENOUS | Status: DC
  Administered 2012-09-23 – 2012-10-04 (×12): 40 mg via INTRAVENOUS
  Filled 2012-09-23 (×14): qty 40

## 2012-09-23 MED ORDER — SODIUM CHLORIDE 0.9 % IV BOLUS (SEPSIS)
1000.0000 mL | Freq: Once | INTRAVENOUS | Status: AC
  Administered 2012-09-23: 1000 mL via INTRAVENOUS

## 2012-09-23 MED ORDER — FENTANYL CITRATE 0.05 MG/ML IJ SOLN
50.0000 ug | Freq: Once | INTRAMUSCULAR | Status: AC
  Administered 2012-09-23: 50 ug via INTRAVENOUS
  Filled 2012-09-23: qty 2

## 2012-09-23 MED ORDER — ONDANSETRON HCL 4 MG/2ML IJ SOLN
4.0000 mg | Freq: Once | INTRAMUSCULAR | Status: AC
  Administered 2012-09-23: 4 mg via INTRAVENOUS
  Filled 2012-09-23: qty 2

## 2012-09-23 MED ORDER — SODIUM CHLORIDE 0.9 % IV SOLN
1.0000 g | INTRAVENOUS | Status: DC
  Administered 2012-09-23 – 2012-10-02 (×10): 1 g via INTRAVENOUS
  Filled 2012-09-23 (×11): qty 1

## 2012-09-23 MED ORDER — DOCUSATE SODIUM 100 MG PO CAPS
300.0000 mg | ORAL_CAPSULE | Freq: Two times a day (BID) | ORAL | Status: DC
  Administered 2012-09-23 – 2012-09-24 (×2): 300 mg via ORAL
  Filled 2012-09-23 (×3): qty 3

## 2012-09-23 MED ORDER — METRONIDAZOLE IN NACL 5-0.79 MG/ML-% IV SOLN
500.0000 mg | Freq: Once | INTRAVENOUS | Status: AC
  Administered 2012-09-23: 500 mg via INTRAVENOUS
  Filled 2012-09-23: qty 100

## 2012-09-23 MED ORDER — ENOXAPARIN SODIUM 40 MG/0.4ML ~~LOC~~ SOLN
40.0000 mg | SUBCUTANEOUS | Status: DC
  Administered 2012-09-23 – 2012-10-04 (×12): 40 mg via SUBCUTANEOUS
  Filled 2012-09-23 (×13): qty 0.4

## 2012-09-23 MED ORDER — CIPROFLOXACIN IN D5W 400 MG/200ML IV SOLN
400.0000 mg | Freq: Once | INTRAVENOUS | Status: AC
  Administered 2012-09-23: 400 mg via INTRAVENOUS
  Filled 2012-09-23: qty 200

## 2012-09-23 MED ORDER — HYDROMORPHONE HCL PF 1 MG/ML IJ SOLN
1.0000 mg | Freq: Once | INTRAMUSCULAR | Status: AC
  Administered 2012-09-23: 1 mg via INTRAVENOUS
  Filled 2012-09-23: qty 1

## 2012-09-23 MED ORDER — ALPRAZOLAM 1 MG PO TABS
1.0000 mg | ORAL_TABLET | Freq: Three times a day (TID) | ORAL | Status: DC | PRN
  Administered 2012-09-23 – 2012-10-04 (×12): 1 mg via ORAL
  Filled 2012-09-23 (×12): qty 1

## 2012-09-23 NOTE — Progress Notes (Signed)
Utilization Review completed.  Zykera Abella RN CM  

## 2012-09-23 NOTE — H&P (Signed)
Patient seen and examined.  This could be acute diverticulitis with contained microperforation, stercoral ulceration with contained microperforation or a possible partially obstructing neoplasm.   She has a significant colonic stool burden.  Will treat with bowel rest and broad spectrum antibiotics.  If this fails, would likely need a Hartmann procedure.  This has all been explained to her.

## 2012-09-23 NOTE — ED Notes (Signed)
Pt c/o abdominal pain, distention and constipation x "over 2 weeks," dysuria x 4 days and vaginal discharge since last night.  Pain score 10/10.  Pt was seen at [facility] 9/9 for same complaint.  Sts "I did everything they told me to do and I'm not any better.  I still havent had a bowel movement."  Vitals are stable.

## 2012-09-23 NOTE — ED Provider Notes (Signed)
CSN: [id number]     Arrival date & time 09/23/12  1038 History   First MD Initiated Contact with Patient 09/23/12 1259     Chief Complaint  Patient presents with  . Constipation  . Abdominal Pain   (Consider location/radiation/quality/duration/timing/severity/associated sxs/prior Treatment) Patient is a 52 y.o. female presenting with constipation and abdominal pain.  Constipation Associated symptoms: abdominal pain   Abdominal Pain Associated symptoms: constipation    Pt reports about 2 weeks of worsening diffuse abdominal pain and constipation. Has tried numerous OTC stool softeners and enemas with no relief. She was seen in the ED about a week ago for same and had a CT showing R sided stool burden and sigmoid inflammatory changes. She was given oral Abx which she has been trying to take but still having occasional vomiting. Now also having worsening pain, abdominal distention and difficulty urinating. She has spoken to staff at PCP office several times but has not been to his office yet. Denies fever. No blood in vomit. No blood in enema returns.   Past Medical History  Diagnosis Date  . Colitis    Past Surgical History  Procedure Laterality Date  . Appendectomy    . Tonsillectomy    . Bladder repair     History reviewed. No pertinent family history. History  Substance Use Topics  . Smoking status: Never Smoker   . Smokeless tobacco: Not on file  . Alcohol Use: No   OB History   Grav Para Term Preterm Abortions TAB SAB Ect Mult Living                 Review of Systems  Gastrointestinal: Positive for abdominal pain and constipation.   All other systems reviewed and are negative except as noted in HPI.   Allergies  Sulfa antibiotics  Home Medications   Current Outpatient Rx  Name  Route  Sig  Dispense  Refill  . ALPRAZolam (XANAX) 1 MG tablet   Oral   Take 1 mg by mouth 3 (three) times daily as needed for anxiety.          . bisacodyl (DULCOLAX) 5 MG EC  tablet   Oral   Take 5 mg by mouth daily as needed for constipation.         Marland Kitchen buPROPion (WELLBUTRIN XL) 300 MG 24 hr tablet   Oral   Take 300 mg by mouth daily.          . ciprofloxacin (CIPRO) 500 MG tablet   Oral   Take 1 tablet (500 mg total) by mouth 2 (two) times daily. One po bid x 7 days   14 tablet   0   . citalopram (CELEXA) 20 MG tablet   Oral   Take 40 mg by mouth daily.         Marland Kitchen docusate sodium (COLACE) 100 MG capsule   Oral   Take 300 mg by mouth 2 (two) times daily as needed for constipation.          . metroNIDAZOLE (FLAGYL) 500 MG tablet   Oral   Take 1 tablet (500 mg total) by mouth 2 (two) times daily. One po bid x 7 days   14 tablet   0   . ORTHO-CYCLEN, 28, 0.25-35 MG-MCG tablet   Oral   Take 1 tablet by mouth daily.          . phentermine 37.5 MG capsule   Oral   Take 37.5 mg by mouth every  morning.         . polyethylene glycol (MIRALAX / GLYCOLAX) packet   Oral   Take 17 g by mouth 2 (two) times daily as needed (constipation).          . sodium phosphate (FLEET) enema   Rectal   Place 1 enema rectally 2 (two) times daily as needed (for constipation).           BP 146/74  Pulse 104  Temp(Src) 98.4 F (36.9 C) (Oral)  Resp 18  SpO2 97%  LMP 09/17/2012 Physical Exam  Nursing note and vitals reviewed. Constitutional: She is oriented to person, place, and time. She appears well-developed and well-nourished.  Morbidly obese  HENT:  Head: Normocephalic and atraumatic.  Eyes: EOM are normal. Pupils are equal, round, and reactive to light.  Neck: Normal range of motion. Neck supple.  Cardiovascular: Normal rate, normal heart sounds and intact distal pulses.   Pulmonary/Chest: Effort normal and breath sounds normal.  Abdominal: Bowel sounds are normal. She exhibits no distension. There is tenderness (diffuse tenderness, worse in lower abdomen). There is guarding (LLQ). There is no rebound.  Musculoskeletal: Normal range of  motion. She exhibits no edema and no tenderness.  Neurological: She is alert and oriented to person, place, and time. She has normal strength. No cranial nerve deficit or sensory deficit.  Skin: Skin is warm and dry. No rash noted.  Psychiatric: She has a normal mood and affect.    ED Course  Procedures (including critical care time) Labs Review Labs Reviewed  CBC WITH DIFFERENTIAL - Abnormal; Notable for the following:    WBC 13.3 (*)    MCH 25.7 (*)    Platelets 419 (*)    Neutrophils Relative % 78 (*)    Neutro Abs 10.3 (*)    All other components within normal limits  COMPREHENSIVE METABOLIC PANEL - Abnormal; Notable for the following:    Glucose, Bld 143 (*)    Albumin 3.0 (*)    Total Bilirubin 0.2 (*)    All other components within normal limits  URINALYSIS, ROUTINE W REFLEX MICROSCOPIC   Imaging Review Ct Abdomen Pelvis W Contrast  09/23/2012   *RADIOLOGY REPORT*  Clinical Data: Colitis  CT ABDOMEN AND PELVIS WITH CONTRAST  Technique:  Multidetector CT imaging of the abdomen and pelvis was performed following the standard protocol during bolus administration of intravenous contrast.  Contrast: 29mL OMNIPAQUE IOHEXOL 300 MG/ML  SOLN, 168mL OMNIPAQUE IOHEXOL 300 MG/ML  SOLN  Comparison: 09/17/2012  Findings: Inflammatory changes of the sigmoid colon have worsened with wall thickening and stranding in the adjacent fat.  There is now an abnormal extraluminal collection of primarily gas and inflamed tissue posterior to the sigmoid colon measuring 4.8 x 4.3 cm on image 70.  There is no defined fluid collection for drainage.  At the mid to distal sigmoid colon on image 74, the wall is irregular.  Underlying lesion of the colon cannot be excluded.  Stable intra-abdominal organs.  Adenopathy in the mesentery of the sigmoid colon is more pronounced.  IMPRESSION: The inflammatory process has worsened.  There is now a collection of primarily gas and soft tissue density posterior to the inflamed  sigmoid colon measuring 4.3 x 4.8 cm.  This may represent sequelae of perforation or a developing abscess.  Beyond the inflamed colon, in the distal sigmoid colon, the wall is irregular and underlying adenocarcinoma is not excluded.  Correlate clinically as for the need for follow-up colonoscopy.  Original Report Authenticated By: Marybelle Killings, M.D.   Dg Abd Acute W/chest  09/23/2012   *RADIOLOGY REPORT*  Clinical Data: Constipation, abdominal pain and nausea  ACUTE ABDOMEN SERIES (ABDOMEN 2 VIEW & CHEST 1 VIEW)  Comparison: 09/17/2012 CT  Findings: Cardiomediastinal silhouette is within normal limits. The lungs are clear. No pleural effusion.  No pneumothorax.  No acute osseous abnormality.  No free air beneath the diaphragms.  Moderate volume of stool noted throughout nondilated colon with residual contrast from previous exam.  No dilated loop of bowel or abnormal air-fluid level.  No abnormal calcific opacity.  No acute osseous finding.  IMPRESSION: No acute cardiopulmonary process.  Nonobstructive bowel gas pattern.   Original Report Authenticated By: Conchita Paris, M.D.    MDM  No diagnosis found.  Pt with recent colitis causing what sounds like an adynamic ileus has failed outpatient treatment. Will recheck labs and CT to check for abscess formation but plan on admission for IV Abx and pain control.   4:00 PM Reviewed CT images with radiologist and discussed results with Dr. Barry Dienes on call for General Surgery. She will send her PA down to see the patient and decide if she needs surgery or not. Care signed out to Dr. Darl Householder at change of shift pending disposition.   Charles B. Karle Starch, MD 09/23/12 [phone]

## 2012-09-23 NOTE — H&P (Signed)
[name] is an 52 y.o. female.   PCP: DR. [name] Prior GI:   She thinks it was Dr. Velora Heckler,  has not seen for perhaps 20 years.  Chief Complaint: Abdominal pain, constipation since 09/06/12, some nausea and some vomiting.  HPI: Pt has a long history of chronic constipation and will normally go 1-3 days between bowel movements. In August she started having trouble near the end of August.  She reports the last BM on 8/29, she had some abdominal pain then and was concerned with lack of bowel movement.  She contacted Dr. Dois Davenport office.  She was at his office and placed on some Miralax, but another person suggested she try some dulcolax and stool softnesr also.   She has been doing both since 09/06/12.  She was seen in the ER on 09/17/12 with constipation, nausea and some vomiting.  WBC was elevated 19.5, CT scan showed some stranding, possible colitis. She was reportedly treated by the ER with Milk and Molasses enema.She was reported to have relief from her abdominal pain and constipation. She was discharged on Cipro and flagyl for her possible colitis.   Pt says she never got and enema and reports still no BM since 8/29.  She is having episodic pain, nausea and vomiting.  She has not taken her home medicines since 8/29, and has not been able to work since that time.  She eats some on and off, but cannot eat much before she has pain, then some nausea, with some occasional vomiting.  She says she does have some bleeding with constipation, and says she has had some recently without the constipation, she has just completed her period.  She returns today with nausea, some vomiting, abdominal pain, and ongoing constipation.   Labs show her WBC is 13.3, H/H is down slightly since her last on 9/9.  Her CMP is essentially normal aside from her low albumin.  CT scan today shows:  The inflammatory process has worsened. There is now a collection  of primarily gas and soft tissue density posterior to the inflamed  sigmoid colon measuring 4.3 x 4.8 cm. This may represent sequelae of perforation or a developing abscess.  Beyond the inflamed colon, in the distal sigmoid colon, the wall is irregular and underlying adenocarcinoma is not excluded.  We have been ask to see and admit.        Past Medical History  Diagnosis Date  . Colitis   . Obesity (BMI 30-39.9) 09/23/2012  . Anxiety associated with depression  She says she can have 2-4 panic attacks per week depending on stressor. 09/23/2012  . Hx of tobacco use, presenting hazards to health 09/23/2012  . H/O gastroesophageal reflux (GERD) 09/23/2012  . At high risk for falls 09/23/2012    She has had a fractured ankle and tendon tear with falls over the last couple years.  . Diverticulitis of large intestine with perforation 09/23/2012    Past Surgical History  Procedure Laterality Date  . Appendectomy    . Tonsillectomy    . Bladder repair      Family History:   Father with severe obesity, now bed ridden, AODM, Hypertension Mother living with AODM; caring for her husband. . 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 Sulfa Antibiotics  she does not seem  to be sure but said she may have had a hard time swelling.   Prior to Admission medications   Medication Sig Start Date End Date Taking? Authorizing Provider  ALPRAZolam Duanne Moron) 1 MG tablet Take 1 mg by mouth 3 (three) times daily as needed for anxiety.  08/30/12  Yes Historical Provider, MD  bisacodyl (DULCOLAX) 5 MG EC tablet Take 5 mg by mouth daily as needed for constipation.   Yes Historical Provider, MD  buPROPion (WELLBUTRIN XL) 300 MG 24 hr tablet Take 300 mg by mouth daily.  08/30/12  Yes Historical Provider, MD  ciprofloxacin (CIPRO) 500 MG tablet Take 1 tablet (500 mg total) by mouth 2 (two) times daily. One po  bid x 7 days 09/17/12  Yes Robyn Barrie Lyme, PA-C  citalopram (CELEXA) 20 MG tablet Take 40 mg by mouth daily.   Yes Historical Provider, MD  docusate sodium (COLACE) 100 MG capsule Take 300 mg by mouth 2 (two) times daily as needed for constipation.    Yes Historical Provider, MD  metroNIDAZOLE (FLAGYL) 500 MG tablet Take 1 tablet (500 mg total) by mouth 2 (two) times daily. One po bid x 7 days 09/17/12  Yes Robyn Barrie Lyme, PA-C  ORTHO-CYCLEN, 28, 0.25-35 MG-MCG tablet Take 1 tablet by mouth daily.  08/30/12  Yes Historical Provider, MD  phentermine 37.5 MG capsule Take 37.5 mg by mouth every morning.   Yes Historical Provider, MD  polyethylene glycol (MIRALAX / GLYCOLAX) packet Take 17 g by mouth 2 (two) times daily as needed (constipation).    Yes Historical Provider, MD  sodium phosphate (FLEET) enema Place 1 enema rectally 2 (two) times daily as needed (for constipation).     Historical Provider, MD     (Not in a hospital admission)  Results for orders placed during the hospital encounter of 09/23/12 (from the past 48 hour(s))  CBC WITH DIFFERENTIAL     Status: Abnormal   Collection Time    09/23/12  1:45 PM      Result Value Range   WBC 13.3 (*) 4.0 - 10.5 K/uL   RBC 4.70  3.87 - 5.11 MIL/uL   Hemoglobin 12.1  12.0 - 15.0 g/dL   HCT 37.1  36.0 - 46.0 %   MCV 78.9  78.0 - 100.0 fL   MCH 25.7 (*) 26.0 - 34.0 pg   MCHC 32.6  30.0 - 36.0 g/dL   RDW 13.7  11.5 - 15.5 %   Platelets 419 (*) 150 - 400 K/uL   Neutrophils Relative % 78 (*) 43 - 77 %   Neutro Abs 10.3 (*) 1.7 - 7.7 K/uL   Lymphocytes Relative 14  12 - 46 %   Lymphs Abs 1.9  0.7 - 4.0 K/uL   Monocytes Relative 6  3 - 12 %   Monocytes Absolute 0.8  0.1 - 1.0 K/uL   Eosinophils Relative 2  0 - 5 %   Eosinophils Absolute 0.2  0.0 - 0.7 K/uL   Basophils Relative 0  0 - 1 %   Basophils Absolute 0.1  0.0 - 0.1 K/uL  COMPREHENSIVE METABOLIC PANEL     Status: Abnormal   Collection Time    09/23/12  1:45 PM      Result Value Range    Sodium 138  135 - 145 mEq/L   Potassium 4.1  3.5 - 5.1 mEq/L   Chloride 101  96 - 112 mEq/L   CO2 23  19 - 32 mEq/L  Glucose, Bld 143 (*) 70 - 99 mg/dL   BUN 13  6 - 23 mg/dL   Creatinine, Ser 0.71  0.50 - 1.10 mg/dL   Calcium 9.3  8.4 - 10.5 mg/dL   Total Protein 7.8  6.0 - 8.3 g/dL   Albumin 3.0 (*) 3.5 - 5.2 g/dL   AST 15  0 - 37 U/L   ALT 9  0 - 35 U/L   Alkaline Phosphatase 111  39 - 117 U/L   Total Bilirubin 0.2 (*) 0.3 - 1.2 mg/dL   GFR calc non Af Amer >90  >90 mL/min   GFR calc Af Amer >90  >90 mL/min   Comment: (NOTE)     The eGFR has been calculated using the CKD EPI equation.     This calculation has not been validated in all clinical situations.     eGFR's persistently <90 mL/min signify possible Chronic Kidney     Disease.   Ct Abdomen Pelvis W Contrast  09/23/2012   *RADIOLOGY REPORT*  Clinical Data: Colitis  CT ABDOMEN AND PELVIS WITH CONTRAST  Technique:  Multidetector CT imaging of the abdomen and pelvis was performed following the standard protocol during bolus administration of intravenous contrast.  Contrast: 42mL OMNIPAQUE IOHEXOL 300 MG/ML  SOLN, 154mL OMNIPAQUE IOHEXOL 300 MG/ML  SOLN  Comparison: 09/17/2012  Findings: Inflammatory changes of the sigmoid colon have worsened with wall thickening and stranding in the adjacent fat.  There is now an abnormal extraluminal collection of primarily gas and inflamed tissue posterior to the sigmoid colon measuring 4.8 x 4.3 cm on image 70.  There is no defined fluid collection for drainage.  At the mid to distal sigmoid colon on image 74, the wall is irregular.  Underlying lesion of the colon cannot be excluded.  Stable intra-abdominal organs.  Adenopathy in the mesentery of the sigmoid colon is more pronounced.  IMPRESSION: The inflammatory process has worsened.  There is now a collection of primarily gas and soft tissue density posterior to the inflamed sigmoid colon measuring 4.3 x 4.8 cm.  This may represent sequelae of  perforation or a developing abscess.  Beyond the inflamed colon, in the distal sigmoid colon, the wall is irregular and underlying adenocarcinoma is not excluded.  Correlate clinically as for the need for follow-up colonoscopy.   Original Report Authenticated By: Marybelle Killings, M.D.   Dg Abd Acute W/chest  09/23/2012   *RADIOLOGY REPORT*  Clinical Data: Constipation, abdominal pain and nausea  ACUTE ABDOMEN SERIES (ABDOMEN 2 VIEW & CHEST 1 VIEW)  Comparison: 09/17/2012 CT  Findings: Cardiomediastinal silhouette is within normal limits. The lungs are clear. No pleural effusion.  No pneumothorax.  No acute osseous abnormality.  No free air beneath the diaphragms.  Moderate volume of stool noted throughout nondilated colon with residual contrast from previous exam.  No dilated loop of bowel or abnormal air-fluid level.  No abnormal calcific opacity.  No acute osseous finding.  IMPRESSION: No acute cardiopulmonary process.  Nonobstructive bowel gas pattern.   Original Report Authenticated By: Conchita Paris, M.D.    Review of Systems  Constitutional: Positive for chills (she says air conditioning may have just been to high) and weight loss. Negative for fever.  HENT: Negative.   Eyes: Eye discharge: She was over 350 at one time,  she was doing better, the last time she weighted she was up around 330's.       She wears glasses  Respiratory: Negative.   Cardiovascular: Positive for  chest pain (she had chest pain 2 years ago seen at [facility], but it was a panic attack) and leg swelling. Negative for palpitations, orthopnea, claudication and PND.  Gastrointestinal: Positive for heartburn (had allot when she was smoking, but since she quit it is much better.), nausea, abdominal pain (She complains of severe abdominal pain, all over, but worst in LLQ), constipation (chronic issue since she was a child.) and blood in stool (with constipation.  ). Negative for diarrhea. Vomiting: some on and off.       She  tried to eat yesterday, some wings friends brought her, but her abdominal pain got worse. She had some ice cream this AM.  Last BM was 09/06/12.  Genitourinary: Negative for dysuria, urgency, frequency, hematuria and flank pain.       She complains of abdominal pain with voiding now.  She has recently completed her menstrual cycle, and is on BCP to help with periods.  Musculoskeletal:       It chronically hurts to walk she attributes to weight.  She tolerates it.    Skin: Negative.   Neurological: Negative.   Endo/Heme/Allergies: Negative.   Psychiatric/Behavioral: Positive for depression. Hallucinations: .hmed. The patient is nervous/anxious.        She says she has some problems with panic attacks and it varies with her stressors.    Blood pressure 114/66, pulse 84, temperature 98.7 F (37.1 C), temperature source Oral, resp. rate 16, last menstrual period 09/17/2012, SpO2 98.00%. Physical Exam  Constitutional: She is oriented to person, place, and time. No distress.  Extremely anxious and frighten woman, but no acute distress.  Tearful, and already concerned about a colostomy. BMI 48.7 (pt. Reported height and weight) BP 128/70  Pulse 84  Temp(Src) 98.4 F (36.9 C) (Oral)  Resp 16  SpO2 99%  LMP 09/17/2012   HENT:  Head: Normocephalic and atraumatic.  Nose: Nose normal.  Eyes: Conjunctivae and EOM are normal. Pupils are equal, round, and reactive to light. Right eye exhibits no discharge. Left eye exhibits no discharge. No scleral icterus.  Neck: Normal range of motion. Neck supple. No JVD present. No tracheal deviation present. No thyromegaly present.  Cardiovascular: Normal rate, regular rhythm, normal heart sounds and intact distal pulses.  Exam reveals no gallop.   No murmur heard. Respiratory: Effort normal and breath sounds normal. No respiratory distress. She has no wheezes. She has no rales. She exhibits no tenderness.  GI: Soft. Bowel sounds are normal.  Morbidly  obese, she is tender all over, more in the LLQ than any place else. She describes tenderness  all over the abdomen and has some spasms of pain, that come and go, she points to lower abdomen for this.  Genitourinary:  I did a rectal exam, I did not feel any stool in the vault, there was a good deal of tenderness, but no blood.  I obtained and requested guacic.  Nothing on the finger that looked like new or old blood.  Musculoskeletal: She exhibits edema (trace lower legs).  Lymphadenopathy:    She has no cervical adenopathy.  Neurological: She is alert and oriented to person, place, and time. No cranial nerve deficit.  Skin: Skin is warm and dry. No rash noted. She is not diaphoretic. No erythema. No pallor.  Psychiatric:  She is tearful, extremely frighten and chronically anxious.  She is desperately afraid she will have to have a colostomy.   She has a hx of panic attacks, anxiety and depression.  Assessment/Plan 1.  Probable diverticulitis with perforation and possible abscess 2.  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 3. Chronic and ongoing constipation since 09/06/12.  She reports GI work-up and colonoscopy dating back to her teens. 4.  Severe anxiety with depression/panic attacks, off her medicines since 09/06/12. 5.  Long history of obesity, with weight loss in the past 6.  Family history of breast cancer  Plan:  We will admit and start her on antibiotics, hydration and bowel rest.  We will ask GI to see tomorrow, I will call [facility] and see if she was seen there in the past. Tienna Bienkowski 09/23/2012, 6:14 PM

## 2012-09-24 LAB — CBC
MCH: 25.5 pg — ABNORMAL LOW (ref 26.0–34.0)
MCHC: 32 g/dL (ref 30.0–36.0)
MCV: 79.7 fL (ref 78.0–100.0)
Platelets: 362 10*3/uL (ref 150–400)
RDW: 13.9 % (ref 11.5–15.5)

## 2012-09-24 LAB — BASIC METABOLIC PANEL
CO2: 25 mEq/L (ref 19–32)
Calcium: 8.8 mg/dL (ref 8.4–10.5)
Creatinine, Ser: 0.75 mg/dL (ref 0.50–1.10)
Glucose, Bld: 138 mg/dL — ABNORMAL HIGH (ref 70–99)

## 2012-09-24 NOTE — Progress Notes (Signed)
Chaplain provided emotional support with pt's mother.  Mother tearful and anxious about pt's surgery.  Mother also caring for pt's father who has multiple medical issues.   Will continue to follow and make contact with pt for support.   [place], [place]

## 2012-09-24 NOTE — Progress Notes (Signed)
Assisted patient out of bed to bathroom, voided qs, and walked the full length of [place] hallway.  Tolerated ambulation well, returned to room and sat in recliner.

## 2012-09-24 NOTE — Progress Notes (Signed)
Patient extremely stressed and upset regarding the possibility of having surgery tomorrow that will require her to have a colostomy.  She expresses her many concerns regarding the outcome of the surgery and what to expect long term post operative.  Discussed her concerns, attempted to answer her questions, and offered emotional support.

## 2012-09-24 NOTE — Progress Notes (Signed)
Subjective: She remains extremely tender, she has been seen by Dr. Barry Dienes.  She is also very anxious.   Objective: Vital signs in last 24 hours: Temp:  [97.7 F (36.5 C)-99.1 F (37.3 C)] 98.3 F (36.8 C) (09/16 0508) Pulse Rate:  [84-104] 87 (09/16 0508) Resp:  [16-20] 20 (09/16 0508) BP: (107-155)/(66-101) 107/72 mmHg (09/16 0508) SpO2:  [96 %-100 %] 96 % (09/16 0508) Weight:  [148.8 kg (328 lb 0.7 oz)] 148.8 kg (328 lb 0.7 oz) (09/15 2003) Last BM Date: 09/06/12 I/o=? Afebrile, VSS Labs OK this Am  Intake/Output from previous day: 09/15 0701 - 09/16 0700 In: 1337.5 [I.V.:1287.5; IV Piggyback:50] Out: -  Intake/Output this shift:    General appearance: alert, cooperative and very anxious and distressed emotionally.   GI: she continues to complain of tenderness all over, still worse in lower abdomen. I do not see any significant distension.  Lab Results:   Recent Labs  09/23/12 1345 09/24/12 0333  WBC 13.3* 10.5  HGB 12.1 10.7*  HCT 37.1 33.4*  PLT 419* 362    BMET  Recent Labs  09/23/12 1345 09/24/12 0333  NA 138 139  K 4.1 3.5  CL 101 104  CO2 23 25  GLUCOSE 143* 138*  BUN 13 9  CREATININE 0.71 0.75  CALCIUM 9.3 8.8   PT/INR No results found for this basename: LABPROT, INR,  in the last 72 hours   Recent Labs Lab 09/17/12 1011 09/23/12 1345  AST 17 15  ALT 10 9  ALKPHOS 134* 111  BILITOT 0.7 0.2*  PROT 8.4* 7.8  ALBUMIN 3.5 3.0*     Lipase     Component Value Date/Time   LIPASE 11 09/17/2012 1011     Studies/Results: Ct Abdomen Pelvis W Contrast  09/23/2012   *RADIOLOGY REPORT*  Clinical Data: Colitis  CT ABDOMEN AND PELVIS WITH CONTRAST  Technique:  Multidetector CT imaging of the abdomen and pelvis was performed following the standard protocol during bolus administration of intravenous contrast.  Contrast: 21mL OMNIPAQUE IOHEXOL 300 MG/ML  SOLN, 140mL OMNIPAQUE IOHEXOL 300 MG/ML  SOLN  Comparison: 09/17/2012  Findings:  Inflammatory changes of the sigmoid colon have worsened with wall thickening and stranding in the adjacent fat.  There is now an abnormal extraluminal collection of primarily gas and inflamed tissue posterior to the sigmoid colon measuring 4.8 x 4.3 cm on image 70.  There is no defined fluid collection for drainage.  At the mid to distal sigmoid colon on image 74, the wall is irregular.  Underlying lesion of the colon cannot be excluded.  Stable intra-abdominal organs.  Adenopathy in the mesentery of the sigmoid colon is more pronounced.  IMPRESSION: The inflammatory process has worsened.  There is now a collection of primarily gas and soft tissue density posterior to the inflamed sigmoid colon measuring 4.3 x 4.8 cm.  This may represent sequelae of perforation or a developing abscess.  Beyond the inflamed colon, in the distal sigmoid colon, the wall is irregular and underlying adenocarcinoma is not excluded.  Correlate clinically as for the need for follow-up colonoscopy.   Original Report Authenticated By: Marybelle Killings, M.D.   Dg Abd Acute W/chest  09/23/2012   *RADIOLOGY REPORT*  Clinical Data: Constipation, abdominal pain and nausea  ACUTE ABDOMEN SERIES (ABDOMEN 2 VIEW & CHEST 1 VIEW)  Comparison: 09/17/2012 CT  Findings: Cardiomediastinal silhouette is within normal limits. The lungs are clear. No pleural effusion.  No pneumothorax.  No acute osseous abnormality.  No  free air beneath the diaphragms.  Moderate volume of stool noted throughout nondilated colon with residual contrast from previous exam.  No dilated loop of bowel or abnormal air-fluid level.  No abnormal calcific opacity.  No acute osseous finding.  IMPRESSION: No acute cardiopulmonary process.  Nonobstructive bowel gas pattern.   Original Report Authenticated By: Conchita Paris, M.D.    Medications: . buPROPion  300 mg Oral Daily  . citalopram  40 mg Oral Daily  . docusate sodium  300 mg Oral BID  . enoxaparin (LOVENOX) injection  40 mg  Subcutaneous Q24H  . ertapenem (INVANZ) IV  1 g Intravenous Q24H  . pantoprazole (PROTONIX) IV  40 mg Intravenous QHS    Assessment/Plan 1. Diverticulitis with perforation verses stercoral ulceration and possible abscess  2. 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  3. Chronic and ongoing constipation since 09/06/12. She reports GI work-up and colonoscopy dating back to her teens.  4. Severe anxiety with depression/panic attacks, off her medicines since 09/06/12.  5. Long history of obesity, with weight loss in the past  6. Family history of breast cancer   Plan;  Her WBC is better, Dr. Barry Dienes will ask IR to look at her and see if this is something they could drain.  If she becomes more symptomatic or clinically worse she may need surgery. If she improves we may be able to get her thru this infection and work on clearing her constipation with medical management.  [facility] does not have her under the current name, but she is sure she was seen there .  Her Elwin Sleight name is [name].  At this point will hold off on asking GI to see till we see how she is progressing. I will recheck labs in AM.  LOS: 1 day    [name] 09/24/2012

## 2012-09-24 NOTE — Progress Notes (Signed)
INITIAL NUTRITION ASSESSMENT  DOCUMENTATION CODES Per approved criteria  -Morbid Obesity   INTERVENTION: - Diet advancement per MD - Will continue to monitor   NUTRITION DIAGNOSIS: Inadequate oral intake related to inability to eat as evidenced by NPO.   Goal: Advance diet as tolerated to low fiber diet  Monitor:  Weights, labs, diet advancement, BM, surgery  Reason for Assessment: Nutrition risk   52 y.o. female  Admitting Dx: Diverticulitis of large intestine with perforation  ASSESSMENT: Pt with abdominal pain, constipation since 09/06/12, and some nausea/vomiting. This has been going on for the past 2 weeks. Pt seen by surgery today and possible colostomy was discussed which pt was tearful about. Pt reports minimal PO intake for the past 2 weeks, taking in only enough food to take with medications.   Height: 5' 9'' per pt report  Weight: Wt Readings from Last 1 Encounters:  09/23/12 328 lb 0.7 oz (148.8 kg)    Ideal Body Weight: 145 lb   % Ideal Body Weight: 226%  Wt Readings from Last 10 Encounters:  09/23/12 328 lb 0.7 oz (148.8 kg)    Usual Body Weight: 328 lb   % Usual Body Weight: 100%  BMI:  48.5 kg/(m^2) Extreme class III obesity  Estimated Nutritional Needs: Kcal: 1600-1700 Protein: 65-80g Fluid: 1.6-1.7Lda/y  Skin: Intact  Diet Order: NPO  EDUCATION NEEDS: -No education needs identified at this time   Intake/Output Summary (Last 24 hours) at 09/24/12 1205 Last data filed at 09/24/12 0900  Gross per 24 hour  Intake 1337.5 ml  Output      0 ml  Net 1337.5 ml    Last BM: 8/29  Labs:   Recent Labs Lab 09/23/12 1345 09/24/12 0333  NA 138 139  K 4.1 3.5  CL 101 104  CO2 23 25  BUN 13 9  CREATININE 0.71 0.75  CALCIUM 9.3 8.8  GLUCOSE 143* 138*    CBG (last 3)  No results found for this basename: GLUCAP,  in the last 72 hours  Scheduled Meds: . buPROPion  300 mg Oral Daily  . citalopram  40 mg Oral Daily  . enoxaparin  (LOVENOX) injection  40 mg Subcutaneous Q24H  . ertapenem (INVANZ) IV  1 g Intravenous Q24H  . pantoprazole (PROTONIX) IV  40 mg Intravenous QHS    Continuous Infusions: . dextrose 5% lactated ringers with KCl 20 mEq/L 125 mL/hr at 09/24/12 0533    Past Medical History  Diagnosis Date  . Colitis   . Obesity (BMI 30-39.9) 09/23/2012  . Anxiety associated with depression 09/23/2012  . Hx of tobacco use, presenting hazards to health 09/23/2012  . H/O gastroesophageal reflux (GERD) 09/23/2012  . At high risk for falls 09/23/2012    She has had a fractured ankle and tendon tear with falls over the last couple years.  . Diverticulitis of large intestine with perforation 09/23/2012    Past Surgical History  Procedure Laterality Date  . Appendectomy    . Tonsillectomy    . Bladder repair      Mikey College MS, [place], LDN [phone] Pager [phone] After Hours Pager

## 2012-09-25 ENCOUNTER — Inpatient Hospital Stay (HOSPITAL_COMMUNITY): Payer: 59

## 2012-09-25 LAB — TYPE AND SCREEN
ABO/RH(D): O POS
Antibody Screen: NEGATIVE

## 2012-09-25 LAB — CBC
MCH: 25 pg — ABNORMAL LOW (ref 26.0–34.0)
Platelets: 335 10*3/uL (ref 150–400)
RBC: 4.08 MIL/uL (ref 3.87–5.11)
WBC: 9.3 10*3/uL (ref 4.0–10.5)

## 2012-09-25 LAB — BASIC METABOLIC PANEL
Calcium: 9 mg/dL (ref 8.4–10.5)
GFR calc Af Amer: >90 mL/min (ref >90)
GFR calc non Af Amer: >90 mL/min (ref >90)
Glucose, Bld: 127 mg/dL — ABNORMAL HIGH (ref 70–99)
Sodium: 139 mEq/L (ref 135–145)

## 2012-09-25 LAB — PROTIME-INR: INR: 1.09 (ref 0.00–1.49)

## 2012-09-25 NOTE — Progress Notes (Signed)
Chaplain consulted with pt and pt's mother at bedside.  Provided support around possibility of surgery.   Support around anxiety and uncertainty, support with family system.  Will continue to follow as hospitalization progresses.  Please page as needs arise.

## 2012-09-25 NOTE — Progress Notes (Signed)
Seen, examined, agree with above.    Tender, but localized to lower abdomen, and WBCs down.  Will keep npo and recheck exam and WBCs tomorrow.

## 2012-09-25 NOTE — Progress Notes (Signed)
Seen, examined, agree with above. Significant clinical improvement.    Continue NPO today. Follow up exam tomorrow.  Ice chips today ok.

## 2012-09-25 NOTE — Progress Notes (Signed)
Subjective: She seems better, still complains she wants to have BM, but cannot.  Complains pain goes down to her vagina now.  Objective: Vital signs in last 24 hours: Temp:  [98 F (36.7 C)-98.5 F (36.9 C)] 98 F (36.7 C) (09/17 0600) Pulse Rate:  [77-87] 87 (09/17 0600) Resp:  [18-20] 20 (09/17 0600) BP: (134-144)/(66-82) 134/71 mmHg (09/17 0600) SpO2:  [95 %-99 %] 95 % (09/17 0600) Last BM Date: 09/06/12 Afebrile, VSS Labs are fine, glucose up minimally, WBC continues to improve. Film show no SBO or free air, ongoing constipation. Intake/Output from previous day: 09/16 0701 - 09/17 0700 In: 3077.1 [I.V.:3027.1; IV Piggyback:50] Out: -  Intake/Output this shift:    General appearance: alert, cooperative and no distress GI: abdominal exam is about the same, she complains of pain all quadrants, lower more than uppper.  On exam there isn't any localized tenderness.  she thinks she's distended, I see no difference from admit exam on.  she is somewhat less tender today.  Lab Results:   Recent Labs  09/24/12 0333 09/25/12 0445  WBC 10.5 9.3  HGB 10.7* 10.2*  HCT 33.4* 32.8*  PLT 362 335    BMET  Recent Labs  09/24/12 0333 09/25/12 0445  NA 139 139  K 3.5 3.9  CL 104 104  CO2 25 26  GLUCOSE 138* 127*  BUN 9 6  CREATININE 0.75 0.78  CALCIUM 8.8 9.0   PT/INR  Recent Labs  09/25/12 0445  LABPROT 13.9  INR 1.09     Recent Labs Lab 09/23/12 1345  AST 15  ALT 9  ALKPHOS 111  BILITOT 0.2*  PROT 7.8  ALBUMIN 3.0*     Lipase     Component Value Date/Time   LIPASE 11 09/17/2012 1011     Studies/Results: Ct Abdomen Pelvis W Contrast  09/23/2012   *RADIOLOGY REPORT*  Clinical Data: Colitis  CT ABDOMEN AND PELVIS WITH CONTRAST  Technique:  Multidetector CT imaging of the abdomen and pelvis was performed following the standard protocol during bolus administration of intravenous contrast.  Contrast: 32mL OMNIPAQUE IOHEXOL 300 MG/ML  SOLN, 168mL  OMNIPAQUE IOHEXOL 300 MG/ML  SOLN  Comparison: 09/17/2012  Findings: Inflammatory changes of the sigmoid colon have worsened with wall thickening and stranding in the adjacent fat.  There is now an abnormal extraluminal collection of primarily gas and inflamed tissue posterior to the sigmoid colon measuring 4.8 x 4.3 cm on image 70.  There is no defined fluid collection for drainage.  At the mid to distal sigmoid colon on image 74, the wall is irregular.  Underlying lesion of the colon cannot be excluded.  Stable intra-abdominal organs.  Adenopathy in the mesentery of the sigmoid colon is more pronounced.  IMPRESSION: The inflammatory process has worsened.  There is now a collection of primarily gas and soft tissue density posterior to the inflamed sigmoid colon measuring 4.3 x 4.8 cm.  This may represent sequelae of perforation or a developing abscess.  Beyond the inflamed colon, in the distal sigmoid colon, the wall is irregular and underlying adenocarcinoma is not excluded.  Correlate clinically as for the need for follow-up colonoscopy.   Original Report Authenticated By: Marybelle Killings, M.D.   Dg Abd 2 Views  09/25/2012   CLINICAL DATA:  Distended abdomen. Rule out small bowel obstruction  EXAM: ABDOMEN - 2 VIEW  COMPARISON:  CT 09/23/2012  FINDINGS: Retained stool in the colon which is nondilated. Contrast is present in the colon from CT.  Negative for small bowel obstruction. There is gas in the rectum.  Negative for pneumoperitoneum  IMPRESSION: Negative for bowel obstruction or pneumoperitoneum.   Electronically Signed   By: Franchot Gallo M.D.   On: 09/25/2012 08:46   Dg Abd Acute W/chest  09/23/2012   *RADIOLOGY REPORT*  Clinical Data: Constipation, abdominal pain and nausea  ACUTE ABDOMEN SERIES (ABDOMEN 2 VIEW & CHEST 1 VIEW)  Comparison: 09/17/2012 CT  Findings: Cardiomediastinal silhouette is within normal limits. The lungs are clear. No pleural effusion.  No pneumothorax.  No acute osseous  abnormality.  No free air beneath the diaphragms.  Moderate volume of stool noted throughout nondilated colon with residual contrast from previous exam.  No dilated loop of bowel or abnormal air-fluid level.  No abnormal calcific opacity.  No acute osseous finding.  IMPRESSION: No acute cardiopulmonary process.  Nonobstructive bowel gas pattern.   Original Report Authenticated By: Conchita Paris, M.D.    Medications: . buPROPion  300 mg Oral Daily  . citalopram  40 mg Oral Daily  . enoxaparin (LOVENOX) injection  40 mg Subcutaneous Q24H  . ertapenem (INVANZ) IV  1 g Intravenous Q24H  . pantoprazole (PROTONIX) IV  40 mg Intravenous QHS    Assessment/Plan 1. Diverticulitis with perforation verses stercoral ulceration and possible abscess  2. 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  3. Chronic and ongoing constipation since 09/06/12. She reports GI work-up and colonoscopy dating back to her teens.  4. Severe anxiety with depression/panic attacks, off her medicines since 09/06/12.  5. Long history of obesity, with weight loss in the past  6. Family history of breast cancer   Plan:  Continue bowel rest, IV fluids and antibiotics.  Recheck labs tomorrow.  If she continues to do well, start oral bowel prep tomorrow. Start some ice chips for today. She has been seen and examined by Dr. Barry Dienes.  NPO after MN.     LOS: 2 days    Cami Delawder 09/25/2012

## 2012-09-26 ENCOUNTER — Inpatient Hospital Stay (HOSPITAL_COMMUNITY): Payer: 59

## 2012-09-26 ENCOUNTER — Encounter (HOSPITAL_COMMUNITY): Payer: Self-pay | Admitting: Radiology

## 2012-09-26 LAB — CBC
Hemoglobin: 10.5 g/dL — ABNORMAL LOW (ref 12.0–15.0)
MCH: 25.1 pg — ABNORMAL LOW (ref 26.0–34.0)
MCHC: 31.4 g/dL (ref 30.0–36.0)
MCV: 79.7 fL (ref 78.0–100.0)
Platelets: 345 10*3/uL (ref 150–400)
RBC: 4.19 MIL/uL (ref 3.87–5.11)

## 2012-09-26 MED ORDER — IOHEXOL 300 MG/ML  SOLN
25.0000 mL | INTRAMUSCULAR | Status: AC
  Administered 2012-09-26: 25 mL via ORAL

## 2012-09-26 MED ORDER — IOHEXOL 300 MG/ML  SOLN
125.0000 mL | Freq: Once | INTRAMUSCULAR | Status: AC | PRN
  Administered 2012-09-26: 125 mL via INTRAVENOUS

## 2012-09-26 NOTE — Progress Notes (Signed)
  Subjective: Pt refusing pain medication, but complaining of 10/10 pain.  No BM   Objective: Vital signs in last 24 hours: Temp:  [98 F (36.7 C)-98.4 F (36.9 C)] 98.2 F (36.8 C) (09/18 0539) Pulse Rate:  [78-88] 78 (09/18 0539) Resp:  [18-20] 18 (09/18 0539) BP: (130-143)/(70-80) 136/74 mmHg (09/18 0539) SpO2:  [94 %-95 %] 94 % (09/18 0539) Last BM Date: 09/06/12 Afebrile, VSS WBC up some this AM Intake/Output from previous day: 2022-10-06 0701 - 09/18 0700 In: 2303.3 [I.V.:2253.3; IV Piggyback:50] Out: -  Intake/Output this shift:    General appearance: alert, cooperative and mild distress GI: abdomen is soft, but she complains of pain all over, pain voiding also.  Exam by Dr. Barry Dienes  Lab Results:   Recent Labs  10-05-12 0445 09/26/12 0505  WBC 9.3 10.8*  HGB 10.2* 10.5*  HCT 32.8* 33.4*  PLT 335 345    BMET  Recent Labs  09/24/12 0333 10/05/12 0445  NA 139 139  K 3.5 3.9  CL 104 104  CO2 25 26  GLUCOSE 138* 127*  BUN 9 6  CREATININE 0.75 0.78  CALCIUM 8.8 9.0   PT/INR  Recent Labs  October 05, 2012 0445  LABPROT 13.9  INR 1.09     Recent Labs Lab 09/23/12 1345  AST 15  ALT 9  ALKPHOS 111  BILITOT 0.2*  PROT 7.8  ALBUMIN 3.0*     Lipase     Component Value Date/Time   LIPASE 11 09/17/2012 1011     Studies/Results: Dg Abd 2 Views  10-05-2012   CLINICAL DATA:  Distended abdomen. Rule out small bowel obstruction  EXAM: ABDOMEN - 2 VIEW  COMPARISON:  CT 09/23/2012  FINDINGS: Retained stool in the colon which is nondilated. Contrast is present in the colon from CT. Negative for small bowel obstruction. There is gas in the rectum.  Negative for pneumoperitoneum  IMPRESSION: Negative for bowel obstruction or pneumoperitoneum.   Electronically Signed   By: Franchot Gallo M.D.   On: 05-Oct-2012 08:46    Medications: . buPROPion  300 mg Oral Daily  . citalopram  40 mg Oral Daily  . enoxaparin (LOVENOX) injection  40 mg Subcutaneous Q24H  .  ertapenem (INVANZ) IV  1 g Intravenous Q24H  . pantoprazole (PROTONIX) IV  40 mg Intravenous QHS    Assessment/Plan 1. Diverticulitis with perforation verses stercoral ulceration and possible abscess  2. 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  3. Chronic and ongoing constipation since 09/06/12. She reports GI work-up and colonoscopy dating back to her teens.  4. Severe anxiety with depression/panic attacks, off her medicines since 09/06/12.  5. Long history of obesity, with weight loss in the past  6. Family history of breast cancer  Plan:  WBC is up some, so we will repeat the CT scan today.        LOS: 3 days    Nil Bolser 09/26/2012

## 2012-09-26 NOTE — Progress Notes (Signed)
Seen, agree with above. Seems less tender, but still complaining of a lot of pain.    Repeat Ct scan.  See if abscess area improving or larger.

## 2012-09-27 DIAGNOSIS — K631 Perforation of intestine (nontraumatic): Secondary | ICD-10-CM

## 2012-09-27 DIAGNOSIS — E46 Unspecified protein-calorie malnutrition: Secondary | ICD-10-CM

## 2012-09-27 DIAGNOSIS — K5909 Other constipation: Secondary | ICD-10-CM | POA: Diagnosis present

## 2012-09-27 HISTORY — DX: Other constipation: K59.09

## 2012-09-27 HISTORY — DX: Perforation of intestine (nontraumatic): K63.1

## 2012-09-27 LAB — BASIC METABOLIC PANEL
BUN: 6 mg/dL (ref 6–23)
Creatinine, Ser: 0.97 mg/dL (ref 0.50–1.10)
GFR calc Af Amer: 77 mL/min — ABNORMAL LOW (ref >90)
GFR calc non Af Amer: 66 mL/min — ABNORMAL LOW (ref >90)

## 2012-09-27 LAB — CBC
HCT: 33.6 % — ABNORMAL LOW (ref 36.0–46.0)
MCHC: 31 g/dL (ref 30.0–36.0)
MCV: 80.4 fL (ref 78.0–100.0)
RDW: 14 % (ref 11.5–15.5)

## 2012-09-27 LAB — PREALBUMIN: Prealbumin: 10.7 mg/dL — ABNORMAL LOW (ref 17.0–34.0)

## 2012-09-27 MED ORDER — INFLUENZA VAC SPLIT QUAD 0.5 ML IM SUSP
0.5000 mL | INTRAMUSCULAR | Status: AC
  Filled 2012-09-27 (×2): qty 0.5

## 2012-09-27 MED ORDER — KCL-LACTATED RINGERS-D5W 20 MEQ/L IV SOLN
INTRAVENOUS | Status: AC
  Administered 2012-09-28: 03:00:00 via INTRAVENOUS
  Filled 2012-09-27 (×2): qty 1000

## 2012-09-27 MED ORDER — SODIUM CHLORIDE 0.9 % IJ SOLN
10.0000 mL | Freq: Two times a day (BID) | INTRAMUSCULAR | Status: DC
  Administered 2012-09-27 – 2012-10-05 (×5): 10 mL

## 2012-09-27 MED ORDER — TRACE MINERALS CR-CU-F-FE-I-MN-MO-SE-ZN IV SOLN
INTRAVENOUS | Status: AC
  Administered 2012-09-27: 20:00:00 via INTRAVENOUS
  Filled 2012-09-27: qty 1000

## 2012-09-27 MED ORDER — SODIUM CHLORIDE 0.9 % IJ SOLN
10.0000 mL | INTRAMUSCULAR | Status: DC | PRN
  Administered 2012-09-29 – 2012-10-05 (×5): 10 mL

## 2012-09-27 MED ORDER — KCL-LACTATED RINGERS-D5W 20 MEQ/L IV SOLN
INTRAVENOUS | Status: DC
  Filled 2012-09-27: qty 1000

## 2012-09-27 MED ORDER — FAT EMULSION 20 % IV EMUL
250.0000 mL | INTRAVENOUS | Status: AC
  Administered 2012-09-27: 250 mL via INTRAVENOUS
  Filled 2012-09-27: qty 250

## 2012-09-27 MED ORDER — KCL-LACTATED RINGERS-D5W 20 MEQ/L IV SOLN
INTRAVENOUS | Status: DC
  Filled 2012-09-27 (×2): qty 1000

## 2012-09-27 NOTE — Progress Notes (Signed)
Subjective: Still complains of abdominal pain kind of diffuse, pain voiding, no BM.   Objective: Vital signs in last 24 hours: Temp:  [97.9 F (36.6 C)-98.6 F (37 C)] 98 F (36.7 C) (09/19 0527) Pulse Rate:  [72-80] 77 (09/19 0527) Resp:  [16-18] 18 (09/19 0527) BP: (100-108)/(59-72) 101/72 mmHg (09/19 0527) SpO2:  [95 %-100 %] 95 % (09/19 0527) Last BM Date: 09/06/12 Afebrile, VSS Labs OK WBC is back down to 9.7  Intake/Output from previous day: 09/18 0701 - 09/19 0700 In: 2748.3 [I.V.:2698.3; IV Piggyback:50] Out: -  Intake/Output this shift:    General appearance: alert, cooperative and no distress GI: soft she complains of pain, but nothing focal on her exam.    Lab Results:   Recent Labs  09/26/12 0505 09/27/12 0426  WBC 10.8* 9.7  HGB 10.5* 10.4*  HCT 33.4* 33.6*  PLT 345 420*    BMET  Recent Labs  09/25/12 0445 09/27/12 0426  NA 139 139  K 3.9 3.8  CL 104 103  CO2 26 27  GLUCOSE 127* 100*  BUN 6 6  CREATININE 0.78 0.97  CALCIUM 9.0 9.2   PT/INR  Recent Labs  09/25/12 0445  LABPROT 13.9  INR 1.09     Recent Labs Lab 09/23/12 1345  AST 15  ALT 9  ALKPHOS 111  BILITOT 0.2*  PROT 7.8  ALBUMIN 3.0*     Lipase     Component Value Date/Time   LIPASE 11 09/17/2012 1011     Studies/Results: Ct Abdomen Pelvis W Contrast  09/26/2012   CLINICAL DATA:  Abdominal abscess. Constipation. Colitis.  EXAM: CT ABDOMEN AND PELVIS WITH CONTRAST  TECHNIQUE: Multidetector CT imaging of the abdomen and pelvis was performed using the standard protocol following bolus administration of intravenous contrast.  CONTRAST:  1 OMNIPAQUE IOHEXOL 300 MG/ML SOLN, 182mL OMNIPAQUE IOHEXOL 300 MG/ML SOLN  COMPARISON:  09/23/2012  FINDINGS: Linear subsegmental atelectasis noted in the lower lobes. Trace right pleural effusion.  Mildly distended gallbladder. Prominent segmental wall thickening in the sigmoid colon with surrounding edema and sigmoid diverticulosis.   Scattered foci of extraluminal gas noted in the left lower quadrant. My suspicion is that there is a posterior perforated diverticulum from the proximal sigmoid colon in the vicinity of images 72 through 75 of series 2. Thickened segments of sigmoid colon distal to this point are noted. Some of the locules of gas are isolated, without associated fluid density on without a thick marginal wall. The more caudad portions as on image 71 of series 2 appear to have enhancing wall and adjacent phlegmon, compatible with small abscesses. There is also some adjacent free fluid. A small abscess on image 71 of series 2 posteriorly measures approximately 1.8 cm, including the marginal wall.  Formed stool is present in the rectum. Scattered reactive retroperitoneal lymph nodes are present.  Appendix surgically absent.  Urinary bladder unremarkable.  Sclerosis along the iliac side of the sacroiliac joints noted. Uterine and adnexal contours unremarkable.  IMPRESSION: 1. Minimal improvement in the confluent inflammatory process with extraluminal gas in the left lower quadrant. I suspect that there is been perforation of a proximal sigmoid colon posterior diverticulum leading to the extraluminal gas. There is a 1.8 cm abscess in this vicinity along with some loculations of extraluminal gas which otherwise did not demonstrate thick marginal walls or associated fluid. 2. Wall thickening in the sigmoid colon could be due to inflammation or tumor. 3. Trace right pleural effusion. 4. Mildly distended gallbladder.  5. Osteitis condensans ilii.   Electronically Signed   By: Sherryl Barters   On: 09/26/2012 13:49    Medications: . buPROPion  300 mg Oral Daily  . citalopram  40 mg Oral Daily  . enoxaparin (LOVENOX) injection  40 mg Subcutaneous Q24H  . ertapenem (INVANZ) IV  1 g Intravenous Q24H  . [START ON 09/28/2012] influenza vac split quadrivalent PF  0.5 mL Intramuscular Tomorrow-1000  . pantoprazole (PROTONIX) IV  40 mg  Intravenous QHS    Assessment/Plan 1. Diverticulitis with perforation verses stercoral ulceration and possible abscess  2. 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  3. Chronic and ongoing constipation since 09/06/12. She reports GI work-up and colonoscopy dating back to her teens.  4. Severe anxiety with depression/panic attacks, off her medicines since 09/06/12.  5. Long history of obesity, with weight loss in the past  6. Family history of breast cancer   Plan:  Dr. Barry Dienes has seen pt and wants to continue medical management, ice chips and start TNA.  If she shows some improvement with this we would then work on her constipation issue.  If she develops a more acute abdomen she would require surgery.  We plan to place a PICC line and TNA during the interm period.     LOS: 4 days    [name] 09/27/2012

## 2012-09-27 NOTE — Progress Notes (Addendum)
PARENTERAL NUTRITION CONSULT NOTE - INITIAL  Pharmacy Consult for: TNA Indication: Bowel obstruction   Allergies  Allergen Reactions  . Sulfa Antibiotics     Patient Measurements: Height: 5\' 9"  (175.3 cm) Weight: 328 lb 0.7 oz (148.8 kg) IBW/kg (Calculated) : 66.2 Adjusted Body Weight: 99.2 kg Usual Weight: 148 kg  Vital Signs: Temp: 98 F (36.7 C) (09/19 0527) Temp src: Oral (09/19 0527) BP: 101/72 mmHg (09/19 0527) Pulse Rate: 77 (09/19 0527) Intake/Output from previous day: 09/18 0701 - 09/19 0700 In: 2748.3 [I.V.:2698.3; IV Piggyback:50] Out: -  Intake/Output from this shift:    Labs:  Recent Labs  09/25/12 0445 09/26/12 0505 09/27/12 0426  WBC 9.3 10.8* 9.7  HGB 10.2* 10.5* 10.4*  HCT 32.8* 33.4* 33.6*  PLT 335 345 420*  APTT 35  --   --   INR 1.09  --   --      Recent Labs  09/25/12 0445 09/27/12 0426  NA 139 139  K 3.9 3.8  CL 104 103  CO2 26 27  GLUCOSE 127* 100*  BUN 6 6  CREATININE 0.78 0.97  CALCIUM 9.0 9.2   Estimated Creatinine Clearance: 107.5 ml/min (by C-G formula based on Cr of 0.97).   No results found for this basename: GLUCAP,  in the last 72 hours  Medical History: Past Medical History  Diagnosis Date  . Colitis   . Obesity (BMI 30-39.9) 09/23/2012  . Anxiety associated with depression 09/23/2012  . Hx of tobacco use, presenting hazards to health 09/23/2012  . H/O gastroesophageal reflux (GERD) 09/23/2012  . At high risk for falls 09/23/2012    She has had a fractured ankle and tendon tear with falls over the last couple years.  . Diverticulitis of large intestine with perforation 09/23/2012    Medications:  Scheduled:  . buPROPion  300 mg Oral Daily  . citalopram  40 mg Oral Daily  . enoxaparin (LOVENOX) injection  40 mg Subcutaneous Q24H  . ertapenem (INVANZ) IV  1 g Intravenous Q24H  . [START ON 09/28/2012] influenza vac split quadrivalent PF  0.5 mL Intramuscular Tomorrow-1000  . pantoprazole (PROTONIX) IV  40 mg  Intravenous QHS   Infusions:  . dextrose 5% lactated ringers with KCl 20 mEq/L 100 mL/hr at 09/27/12 0528   PRN: ALPRAZolam, morphine injection, ondansetron  Insulin Requirements in the past 24 hours:  0 - No SSI currently ordered.    Current Nutrition:  NPO  Assessment: 52 yo F with long history of chronic constipation and currently reports no BM since 8/29, now with bowel obstruction and starting TNA per pharmacy.  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   Dr Barry Dienes wants to continue medical management with ice chips and start TNA.  PICC line planned for today.    Nutritional Goals:  RD recommendation from 9/16 Kcal: 1600-1700  Protein: 65-80g  Fluid: 1.6-1.7Lda/y  Clinimix E5/15 at a goal rate of 70 ml/hr + IVFE 20% at 10  ml/hr daily will provide: 84 g/day protein, 1672 Kcal/day.   Current Nutrition:   IVF: D5 in LR with KCL 20 mEw/L @ 100 ml/hr    Glucose - No hx DM; CBG's 100-138  Electrolytes -Electrolytes WNL  LFTs - WNL  Renal: Scr is WNL, CrC > 100 ml/min   TGs - ordered   Prealbumin - ordered   Plan:  At 1800 today:  Start Clinimix E 5/15 at 40 ml/hr, advance to goal rate of 70 ml/hr  Fat emulsion at 10 ml/hr daily   Plan to advance as tolerated to the goal rate.  TNA to contain standard multivitamins and trace elements daily   Reduce IVF to 60 ml/hr once TNA starts   Start SSI Q6h  TNA lab panels on Mondays & Thursdays.  Santino Kinsella, Gaye Alken PharmD Pager #: [phone] 1:11 PM 09/27/2012   Antibiotic consideration  Day #5 Ertapenem, started 9/15 - Consider changing IV abx regimen to Ciprofloxacin 400 mg IV and flagyl 500 mg IV q8h in order to reserve ertapenem use.   Marvis Saefong, Gaye Alken PharmD Pager #: [phone] 2:42 PM 09/27/2012

## 2012-09-27 NOTE — Progress Notes (Addendum)
PARENTERAL NUTRITION CONSULT NOTE  - Follow Up  IV Team placed PICC line at ~1800 tonight.  Will start TNA at [place].  See today's note for details.  Thank you.  Hershal Coria, PharmD, BCPS Pager: [phone] 09/27/2012 5:19 PM

## 2012-09-27 NOTE — Progress Notes (Signed)
NUTRITION FOLLOW UP  Intervention:   - TPN per pharmacy - Diet advancement per MD - Will continue to monitor   Nutrition Dx:   Inadequate oral intake related to inability to eat as evidenced by NPO.    Goal:   Advance diet as tolerated to low fiber diet - not met, pt NPO  New goal: TPN to meet >90% of estimated nutritional needs   Monitor:   Weights, labs, TPN, BM, ? surgery  Assessment:   Pt found to have diverticulitis with perforation versus stercoral ulceration and possible abscess. Still constipated. TPN scheduled to start today. Met with pt who reports some improvement in abdominal pain. Answered nutritional questions about TPN.   Goal rate TPN: Clinimix E5/15 at a goal rate of 70 ml/hr + IVFE 20% at 10 ml/hr daily will provide: 84 g/day protein, 1672 Kcal/day - will meet 104% estimated calorie needs, 105% estimated protein needs  Height: Ht Readings from Last 1 Encounters:  09/24/12 5\' 9"  (1.753 m)    Weight Status:   Wt Readings from Last 1 Encounters:  09/23/12 328 lb 0.7 oz (148.8 kg)    Re-estimated needs:  Kcal: 1600-1700 Protein: 80-90g Fluid: 1.6-1.7L/day  Skin: +1 RLE, LLE edema  Diet Order: NPO   Intake/Output Summary (Last 24 hours) at 09/27/12 1559 Last data filed at 09/27/12 0659  Gross per 24 hour  Intake 2748.33 ml  Output      0 ml  Net 2748.33 ml    Last BM: 8/29   Labs:   Recent Labs Lab 09/24/12 0333 09/25/12 0445 09/27/12 0426  NA 139 139 139  K 3.5 3.9 3.8  CL 104 104 103  CO2 25 26 27   BUN 9 6 6   CREATININE 0.75 0.78 0.97  CALCIUM 8.8 9.0 9.2  GLUCOSE 138* 127* 100*    CBG (last 3)  No results found for this basename: GLUCAP,  in the last 72 hours  Scheduled Meds: . buPROPion  300 mg Oral Daily  . citalopram  40 mg Oral Daily  . enoxaparin (LOVENOX) injection  40 mg Subcutaneous Q24H  . ertapenem (INVANZ) IV  1 g Intravenous Q24H  . [START ON 09/28/2012] influenza vac split quadrivalent PF  0.5 mL  Intramuscular Tomorrow-1000  . pantoprazole (PROTONIX) IV  40 mg Intravenous QHS    Continuous Infusions: . dextrose 5% lactated ringers with KCl 20 mEq/L 100 mL/hr at 09/27/12 0528  . dextrose 5% lactated ringers with KCl 20 mEq/L    . Marland KitchenTPN (CLINIMIX-E) Adult     And  . fat emulsion       Mikey College MS, RD, LDN [phone] Pager [phone] After Hours Pager

## 2012-09-27 NOTE — Progress Notes (Signed)
Seen, agree with above.   I think her pain is from the constipation as much as from the area of contained perforation.  Hopefully she will continue to have some clinical improvement and work on clean out from above.   Will start TNA. She would do poorly with colostomy due to her size, so we will try to avoid.  She is at high risk for ostomy retraction and/or necrosis and of parastomal hernia.

## 2012-09-27 NOTE — Progress Notes (Signed)
Peripherally Inserted Central Catheter/Midline Placement  The IV Nurse has discussed with the patient and/or persons authorized to consent for the patient, the purpose of this procedure and the potential benefits and risks involved with this procedure.  The benefits include less needle sticks, lab draws from the catheter and patient may be discharged home with the catheter.  Risks include, but not limited to, infection, bleeding, blood clot (thrombus formation), and puncture of an artery; nerve damage and irregular heat beat.  Alternatives to this procedure were also discussed.  PICC/Midline Placement Documentation  PICC / Midline Double Lumen 09/27/12 PICC Right Cephalic (Active)    Consent explained by Claretha Cooper RN   Gordan Payment 09/27/2012, 6:17 PM

## 2012-09-28 LAB — COMPREHENSIVE METABOLIC PANEL
AST: 17 U/L (ref 0–37)
Alkaline Phosphatase: 104 U/L (ref 39–117)
CO2: 28 mEq/L (ref 19–32)
Chloride: 102 mEq/L (ref 96–112)
Creatinine, Ser: 0.95 mg/dL (ref 0.50–1.10)
GFR calc non Af Amer: 68 mL/min — ABNORMAL LOW (ref >90)
Potassium: 4.4 mEq/L (ref 3.5–5.1)
Total Bilirubin: 0.2 mg/dL — ABNORMAL LOW (ref 0.3–1.2)

## 2012-09-28 LAB — DIFFERENTIAL
Eosinophils Absolute: 0.4 10*3/uL (ref 0.0–0.7)
Eosinophils Relative: 4 % (ref 0–5)
Lymphocytes Relative: 20 % (ref 12–46)
Lymphs Abs: 2.1 10*3/uL (ref 0.7–4.0)
Monocytes Absolute: 0.7 10*3/uL (ref 0.1–1.0)
Monocytes Relative: 7 % (ref 3–12)

## 2012-09-28 LAB — MAGNESIUM: Magnesium: 2.1 mg/dL (ref 1.5–2.5)

## 2012-09-28 LAB — CBC
HCT: 33.2 % — ABNORMAL LOW (ref 36.0–46.0)
Hemoglobin: 10.3 g/dL — ABNORMAL LOW (ref 12.0–15.0)
MCH: 25.1 pg — ABNORMAL LOW (ref 26.0–34.0)
MCV: 80.8 fL (ref 78.0–100.0)
RBC: 4.11 MIL/uL (ref 3.87–5.11)

## 2012-09-28 LAB — GLUCOSE, CAPILLARY
Glucose-Capillary: 106 mg/dL — ABNORMAL HIGH (ref 70–99)
Glucose-Capillary: 110 mg/dL — ABNORMAL HIGH (ref 70–99)

## 2012-09-28 MED ORDER — FAT EMULSION 20 % IV EMUL
250.0000 mL | INTRAVENOUS | Status: AC
  Administered 2012-09-28: 250 mL via INTRAVENOUS
  Filled 2012-09-28: qty 250

## 2012-09-28 MED ORDER — TRACE MINERALS CR-CU-F-FE-I-MN-MO-SE-ZN IV SOLN
INTRAVENOUS | Status: AC
  Administered 2012-09-28: 17:00:00 via INTRAVENOUS
  Filled 2012-09-28: qty 2000

## 2012-09-28 MED ORDER — KCL-LACTATED RINGERS-D5W 20 MEQ/L IV SOLN
INTRAVENOUS | Status: AC
  Administered 2012-09-28: 18:00:00 via INTRAVENOUS
  Filled 2012-09-28: qty 1000

## 2012-09-28 NOTE — Progress Notes (Signed)
  Subjective: Says that she feels a little better.  Still no BM  Objective: Vital signs in last 24 hours: Temp:  [97.1 F (36.2 C)-98.8 F (37.1 C)] 97.1 F (36.2 C) (09/20 0600) Pulse Rate:  [78-81] 78 (09/20 0600) Resp:  [16-18] 18 (09/20 0600) BP: (90-131)/(53-81) 117/81 mmHg (09/20 0600) SpO2:  [91 %-96 %] 91 % (09/20 0600) Last BM Date: 09/06/12  Intake/Output from previous day: 09/19 0701 - 09/20 0700 In: 748.8 [I.V.:592.3; IV Piggyback:50; TPN:106.5] Out: -  Intake/Output this shift:    General appearance: alert, cooperative and no distress GI: soft, moderate LLQ tenderness, ND, no peritoneal signs  Lab Results:   Recent Labs  09/27/12 0426 09/28/12 0545  WBC 9.7 10.5  HGB 10.4* 10.3*  HCT 33.6* 33.2*  PLT 420* 370   BMET  Recent Labs  09/27/12 0426 09/28/12 0545  NA 139 138  K 3.8 4.4  CL 103 102  CO2 27 28  GLUCOSE 100* 114*  BUN 6 7  CREATININE 0.97 0.95  CALCIUM 9.2 9.2   PT/INR No results found for this basename: LABPROT, INR,  in the last 72 hours ABG No results found for this basename: PHART, PCO2, PO2, HCO3,  in the last 72 hours  Studies/Results: No results found.  Anti-infectives: Anti-infectives   Start     Dose/Rate Route Frequency Ordered Stop   09/23/12 2000  ertapenem (INVANZ) 1 g in sodium chloride 0.9 % 50 mL IVPB     1 g 100 mL/hr over 30 Minutes Intravenous Every 24 hours 09/23/12 1836     09/23/12 1500  ciprofloxacin (CIPRO) IVPB 400 mg     400 mg 200 mL/hr over 60 Minutes Intravenous  Once 09/23/12 1445 09/23/12 1759   09/23/12 1500  metroNIDAZOLE (FLAGYL) IVPB 500 mg     500 mg 100 mL/hr over 60 Minutes Intravenous  Once 09/23/12 1445 09/23/12 1635      Assessment/Plan: s/p * No surgery found * focal sigmoid perforation of uncertain etiology.  feels better today.  continue abx and hopefully able to manage nonoperatively.    LOS: 5 days    Madilyn Hook DAVID 09/28/2012

## 2012-09-28 NOTE — Progress Notes (Signed)
PARENTERAL NUTRITION CONSULT NOTE - FOLLOW UP  Pharmacy Consult for TPN Indication: Bowel Obstruction  Allergies  Allergen Reactions  . Sulfa Antibiotics     Patient Measurements: Height: 5\' 9"  (175.3 cm) Weight: 328 lb 0.7 oz (148.8 kg) IBW/kg (Calculated) : 66.2 Adjusted Body Weight: 99.2kg Usual Weight: 148kg  Vital Signs: Temp: 97.1 F (36.2 C) (09/20 0600) Temp src: Oral (09/20 0600) BP: 117/81 mmHg (09/20 0600) Pulse Rate: 78 (09/20 0600) Intake/Output from previous day: 09/19 0701 - 09/20 0700 In: 748.8 [I.V.:592.3; IV Piggyback:50; TPN:106.5] Out: -   Labs:  Recent Labs  09/26/12 0505 09/27/12 0426 09/28/12 0545  WBC 10.8* 9.7 10.5  HGB 10.5* 10.4* 10.3*  HCT 33.4* 33.6* 33.2*  PLT 345 420* 370     Recent Labs  09/27/12 0426 09/27/12 1230 09/28/12 0545  NA 139  --  138  K 3.8  --  4.4  CL 103  --  102  CO2 27  --  28  GLUCOSE 100*  --  114*  BUN 6  --  7  CREATININE 0.97  --  0.95  CALCIUM 9.2  --  9.2  MG  --   --  2.1  PHOS  --   --  4.2  PROT  --   --  7.1  ALBUMIN  --   --  2.8*  AST  --   --  17  ALT  --   --  7  ALKPHOS  --   --  104  BILITOT  --   --  0.2*  PREALBUMIN  --  10.7*  --    Estimated Creatinine Clearance: 109.7 ml/min (by C-G formula based on Cr of 0.95).    Recent Labs  09/28/12 0623  GLUCAP 117*    Medications:  Scheduled:  . buPROPion  300 mg Oral Daily  . citalopram  40 mg Oral Daily  . enoxaparin (LOVENOX) injection  40 mg Subcutaneous Q24H  . ertapenem (INVANZ) IV  1 g Intravenous Q24H  . influenza vac split quadrivalent PF  0.5 mL Intramuscular Tomorrow-1000  . pantoprazole (PROTONIX) IV  40 mg Intravenous QHS  . sodium chloride  10-40 mL Intracatheter Q12H   Infusions:  . dextrose 5% lactated ringers with KCl 20 mEq/L 60 mL/hr at 09/28/12 0314  . Marland KitchenTPN (CLINIMIX-E) Adult 40 mL/hr at 09/27/12 1943   And  . fat emulsion 250 mL (09/27/12 1944)    Nutritional Goals:   RD recommendations (9/16 ):   1600-1700 Kcal, 65-80g Protein, 1.6-1.7 L fluid/day  Clinimix E5/15 at a goal rate of 70 ml/hr + IVFE 20% at 10 ml/hr daily will provide: 84 g/day protein, 1672 Kcal/day.  Current nutrition:   Diet: NPO  IVF: D5LR + 20KCl @ 60 ml/hr  Clinimix @ 12ml/hr + Lipids @ 66ml/hr  CBGs & Insulin requirements past 24 hours:  CBGs: < 150 No insulin currently ordered.  Assessment:  52 yo F with long history of chronic constipation and currently reports no BM since 8/29, now with bowel obstruction and starting TNA per pharmacy. 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 Dr Barry Dienes wants to continue medical management with ice chips and start TNA 9/19.  Labs  Renal,Electrolytes: SCr and electrolytes are wnl.   CrCl > 100 ml/min.  Hepatic function: AST/ALT wnl (9/20)  Pre-Albumin: 10.7 (9/19)  TG:   133 (9/20)  Glucose: <  150  TPN Access: PICC line placed 9/19  TPN day#:  2  Plan:  At 1800 today:  Increase to Clinimix E 5/15 at 60 ml/hr.  20% fat emulsion at 10 ml/hr.  TNA to contain standard multivitamins and trace elements daily.  Reduce IVF to 40 ml/hr.  Continue CBGs q6h.  TNA lab panels on Mondays & Thursdays.  BMET, mag and phos tomorrow AM.  F/u daily.  Gretta Arab PharmD, BCPS Pager [phone] 09/28/2012 10:32 AM

## 2012-09-29 LAB — PHOSPHORUS: Phosphorus: 4.5 mg/dL (ref 2.3–4.6)

## 2012-09-29 LAB — BASIC METABOLIC PANEL
BUN: 9 mg/dL (ref 6–23)
Calcium: 9.3 mg/dL (ref 8.4–10.5)
Chloride: 101 mEq/L (ref 96–112)
Creatinine, Ser: 0.87 mg/dL (ref 0.50–1.10)
GFR calc Af Amer: 88 mL/min — ABNORMAL LOW (ref >90)
GFR calc non Af Amer: 76 mL/min — ABNORMAL LOW (ref >90)
Sodium: 136 mEq/L (ref 135–145)

## 2012-09-29 LAB — GLUCOSE, CAPILLARY
Glucose-Capillary: 113 mg/dL — ABNORMAL HIGH (ref 70–99)
Glucose-Capillary: 119 mg/dL — ABNORMAL HIGH (ref 70–99)

## 2012-09-29 LAB — MAGNESIUM: Magnesium: 2.1 mg/dL (ref 1.5–2.5)

## 2012-09-29 MED ORDER — TRACE MINERALS CR-CU-F-FE-I-MN-MO-SE-ZN IV SOLN
INTRAVENOUS | Status: AC
  Administered 2012-09-29: 17:00:00 via INTRAVENOUS
  Filled 2012-09-29: qty 2000

## 2012-09-29 MED ORDER — MAGNESIUM CITRATE PO SOLN
60.0000 mL | Freq: Two times a day (BID) | ORAL | Status: DC
  Administered 2012-09-29 – 2012-09-30 (×3): 60 mL via ORAL

## 2012-09-29 MED ORDER — FAT EMULSION 20 % IV EMUL
250.0000 mL | INTRAVENOUS | Status: AC
  Administered 2012-09-29: 250 mL via INTRAVENOUS
  Filled 2012-09-29: qty 250

## 2012-09-29 MED ORDER — KCL-LACTATED RINGERS-D5W 20 MEQ/L IV SOLN
INTRAVENOUS | Status: DC
  Administered 2012-09-29 – 2012-10-01 (×2): via INTRAVENOUS
  Filled 2012-09-29 (×3): qty 1000

## 2012-09-29 NOTE — Progress Notes (Signed)
Patient had a small bowel movement.  Durwin Nora RN

## 2012-09-29 NOTE — Progress Notes (Signed)
  Subjective: Her pain continues to improve though still has some LLQ discomfort.  No bowel movements  Objective: Vital signs in last 24 hours: Temp:  [97.5 F (36.4 C)-97.9 F (36.6 C)] 97.9 F (36.6 C) (09/21 0818) Pulse Rate:  [73-85] 75 (09/21 0818) Resp:  [18-20] 20 (09/21 0818) BP: (109-140)/(74-92) 140/92 mmHg (09/21 0818) SpO2:  [95 %-98 %] 96 % (09/21 0818) Last BM Date: 10/07/12  Intake/Output from previous day: 09/20 0701 - 09/21 0700 In: 480 [P.O.:480] Out: -  Intake/Output this shift:    General appearance: alert, cooperative and no distress Resp: nonlabored Cardio: normal rate, regular GI: abdomen is soft and ND, mild LLQ tenderness to deep palpation, no peritoneal signs  Lab Results:   Recent Labs  09/27/12 0426 09/28/12 0545  WBC 9.7 10.5  HGB 10.4* 10.3*  HCT 33.6* 33.2*  PLT 420* 370   BMET  Recent Labs  09/28/12 0545 09/29/12 0615  NA 138 136  K 4.4 3.9  CL 102 101  CO2 28 29  GLUCOSE 114* 111*  BUN 7 9  CREATININE 0.95 0.87  CALCIUM 9.2 9.3   PT/INR No results found for this basename: LABPROT, INR,  in the last 72 hours ABG No results found for this basename: PHART, PCO2, PO2, HCO3,  in the last 72 hours  Studies/Results: No results found.  Anti-infectives: Anti-infectives   Start     Dose/Rate Route Frequency Ordered Stop   09/23/12 2000  ertapenem (INVANZ) 1 g in sodium chloride 0.9 % 50 mL IVPB     1 g 100 mL/hr over 30 Minutes Intravenous Every 24 hours 09/23/12 1836     09/23/12 1500  ciprofloxacin (CIPRO) IVPB 400 mg     400 mg 200 mL/hr over 60 Minutes Intravenous  Once 09/23/12 1445 09/23/12 1759   09/23/12 1500  metroNIDAZOLE (FLAGYL) IVPB 500 mg     500 mg 100 mL/hr over 60 Minutes Intravenous  Once 09/23/12 1445 09/23/12 1635      Assessment/Plan: s/p * No surgery found * she seems to be improving subjectively but still no bowel movements.  consider gastrograffin enema for both dx and therapeutic benefit  vs. bowel regimen from above  LOS: 6 days    [place], [place] 09/29/2012

## 2012-09-29 NOTE — Progress Notes (Signed)
PARENTERAL NUTRITION CONSULT NOTE - FOLLOW UP  Pharmacy Consult for TPN Indication: Bowel Obstruction  Allergies  Allergen Reactions  . Sulfa Antibiotics     Patient Measurements: Height: 5\' 9"  (175.3 cm) Weight: 328 lb 0.7 oz (148.8 kg) IBW/kg (Calculated) : 66.2 Adjusted Body Weight: 99.2kg Usual Weight: 148kg  Vital Signs: Temp: 97.5 F (36.4 C) (09/20 2215) Temp src: Oral (09/20 2215) BP: 129/74 mmHg (09/20 2215) Pulse Rate: 85 (09/20 2215) Intake/Output from previous day: 09/20 0701 - 09/21 0700 In: 480 [P.O.:480] Out: -   Labs:  Recent Labs  09/27/12 0426 09/28/12 0545  WBC 9.7 10.5  HGB 10.4* 10.3*  HCT 33.6* 33.2*  PLT 420* 370     Recent Labs  09/27/12 0426 09/27/12 1230 09/28/12 0545  NA 139  --  138  K 3.8  --  4.4  CL 103  --  102  CO2 27  --  28  GLUCOSE 100*  --  114*  BUN 6  --  7  CREATININE 0.97  --  0.95  CALCIUM 9.2  --  9.2  MG  --   --  2.1  PHOS  --   --  4.2  PROT  --   --  7.1  ALBUMIN  --   --  2.8*  AST  --   --  17  ALT  --   --  7  ALKPHOS  --   --  104  BILITOT  --   --  0.2*  PREALBUMIN  --  10.7* 10.9*  TRIG  --   --  133   Estimated Creatinine Clearance: 109.7 ml/min (by C-G formula based on Cr of 0.95).    Recent Labs  09/28/12 1656 09/29/12 0013 09/29/12 0555  GLUCAP 110* 94 113*    Medications:  Scheduled:  . buPROPion  300 mg Oral Daily  . citalopram  40 mg Oral Daily  . enoxaparin (LOVENOX) injection  40 mg Subcutaneous Q24H  . ertapenem (INVANZ) IV  1 g Intravenous Q24H  . influenza vac split quadrivalent PF  0.5 mL Intramuscular Tomorrow-1000  . pantoprazole (PROTONIX) IV  40 mg Intravenous QHS  . sodium chloride  10-40 mL Intracatheter Q12H   Infusions:  . dextrose 5% lactated ringers with KCl 20 mEq/L 40 mL/hr at 09/28/12 1742  . Marland KitchenTPN (CLINIMIX-E) Adult 60 mL/hr at 09/28/12 1726   And  . fat emulsion 250 mL (09/28/12 1727)    Nutritional Goals:   RD recommendations (9/16 ):   1600-1700 Kcal, 65-80g Protein, 1.6-1.7 L fluid/day  Clinimix E5/15 at a goal rate of 70 ml/hr + IVFE 20% at 10 ml/hr daily will provide: 84 g/day protein, 1672 Kcal/day.  Current nutrition:   Diet: NPO  IVF: D5LR + 20KCl @ 40 ml/hr  Clinimix @ 45ml/hr + Lipids @ 61ml/hr  CBGs & Insulin requirements past 24 hours:  CBGs: < 150 No insulin currently ordered.  Assessment:  52 yo F with long history of chronic constipation and currently reports no BM since 8/29, now with bowel obstruction.  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 Dr Barry Dienes wants to continue medical management with ice chips and start TNA 9/19.  Labs  Renal, Electrolytes: SCr and electrolytes are wnl.   CrCl > 100 ml/min.  Hepatic function: AST/ALT wnl (9/20)  Pre-Albumin: 10.7 (9/19)  TG:   133 (9/20)  Glucose: <150  TPN Access: PICC line placed 9/19  TPN day#:  3  Plan:  At 1800 today:  Increase to Clinimix E 5/15 at 70 ml/hr.  20% fat emulsion at 10 ml/hr.  TNA to contain standard multivitamins and trace elements daily.  Reduce IVF to 30 ml/hr.  Continue CBGs q6h.  TNA lab panels on Mondays & Thursdays.  Gretta Arab PharmD, BCPS Pager [phone] 09/29/2012 7:13 AM

## 2012-09-30 LAB — DIFFERENTIAL
Basophils Absolute: 0 10*3/uL (ref 0.0–0.1)
Eosinophils Absolute: 0.4 10*3/uL (ref 0.0–0.7)
Lymphocytes Relative: 22 % (ref 12–46)
Lymphs Abs: 2.2 10*3/uL (ref 0.7–4.0)
Monocytes Relative: 5 % (ref 3–12)
Neutro Abs: 7 10*3/uL (ref 1.7–7.7)
Neutrophils Relative %: 69 % (ref 43–77)

## 2012-09-30 LAB — COMPREHENSIVE METABOLIC PANEL
AST: 16 U/L (ref 0–37)
Albumin: 2.8 g/dL — ABNORMAL LOW (ref 3.5–5.2)
Alkaline Phosphatase: 104 U/L (ref 39–117)
BUN: 12 mg/dL (ref 6–23)
Calcium: 9.3 mg/dL (ref 8.4–10.5)
GFR calc Af Amer: 88 mL/min — ABNORMAL LOW (ref >90)
Glucose, Bld: 101 mg/dL — ABNORMAL HIGH (ref 70–99)
Potassium: 4.1 mEq/L (ref 3.5–5.1)
Total Protein: 7.5 g/dL (ref 6.0–8.3)

## 2012-09-30 LAB — CBC
Hemoglobin: 10.7 g/dL — ABNORMAL LOW (ref 12.0–15.0)
MCH: 24.8 pg — ABNORMAL LOW (ref 26.0–34.0)
Platelets: 358 10*3/uL (ref 150–400)
RBC: 4.31 MIL/uL (ref 3.87–5.11)
WBC: 10.2 10*3/uL (ref 4.0–10.5)

## 2012-09-30 LAB — MAGNESIUM: Magnesium: 2.5 mg/dL (ref 1.5–2.5)

## 2012-09-30 LAB — PREALBUMIN: Prealbumin: 14.1 mg/dL — ABNORMAL LOW (ref 17.0–34.0)

## 2012-09-30 LAB — GLUCOSE, CAPILLARY

## 2012-09-30 LAB — PHOSPHORUS: Phosphorus: 4.4 mg/dL (ref 2.3–4.6)

## 2012-09-30 LAB — TRIGLYCERIDES: Triglycerides: 159 mg/dL — ABNORMAL HIGH (ref <150)

## 2012-09-30 MED ORDER — TRACE MINERALS CR-CU-F-FE-I-MN-MO-SE-ZN IV SOLN
INTRAVENOUS | Status: AC
  Administered 2012-09-30: 18:00:00 via INTRAVENOUS
  Filled 2012-09-30: qty 2000

## 2012-09-30 MED ORDER — FAT EMULSION 20 % IV EMUL
240.0000 mL | INTRAVENOUS | Status: AC
  Administered 2012-09-30: 240 mL via INTRAVENOUS
  Filled 2012-09-30: qty 250

## 2012-09-30 MED ORDER — GLYCERIN (LAXATIVE) 2.1 G RE SUPP
3.0000 | Freq: Once | RECTAL | Status: AC
  Administered 2012-09-30: 3 via RECTAL
  Filled 2012-09-30: qty 3

## 2012-09-30 NOTE — Progress Notes (Signed)
  Subjective: She was started on some Mag Citrate yesterday and had a large BM just now.  Dr. Hassell Done wanted to try some glycerine suppositories to help with her BM evacuation. She wants to eat and go home.    Objective: Vital signs in last 24 hours: Temp:  [97.9 F (36.6 C)-98.5 F (36.9 C)] 97.9 F (36.6 C) (09/22 0606) Pulse Rate:  [76-86] 86 (09/22 0606) Resp:  [20] 20 (09/22 0606) BP: (103-132)/(72-85) 123/85 mmHg (09/22 0606) SpO2:  [94 %-96 %] 94 % (09/22 0606) Last BM Date: 09/06/12 Couple pellets reported yesterday, as BM, first since 8/29 Afebrile, VSS Labs ok glucose up some NPO except ice chips and TNA Day 8 of Invanz today Last CT on 09/26/12 Intake/Output from previous day: 09/21 0701 - 09/22 0700 In: 10 [I.V.:10] Out: -  Intake/Output this shift:    General appearance: alert, cooperative and no distress GI: soft, non-tender; bowel sounds normal; no masses,  no organomegaly  Lab Results:   Recent Labs  09/28/12 0545 09/30/12 0545  WBC 10.5 10.2  HGB 10.3* 10.7*  HCT 33.2* 34.6*  PLT 370 358    BMET  Recent Labs  09/29/12 0615 09/30/12 0545  NA 136 138  K 3.9 4.1  CL 101 100  CO2 29 29  GLUCOSE 111* 101*  BUN 9 12  CREATININE 0.87 0.87  CALCIUM 9.3 9.3   PT/INR No results found for this basename: LABPROT, INR,  in the last 72 hours   Recent Labs Lab 09/23/12 1345 09/28/12 0545 09/30/12 0545  AST 15 17 16   ALT 9 7 10   ALKPHOS 111 104 104  BILITOT 0.2* 0.2* 0.2*  PROT 7.8 7.1 7.5  ALBUMIN 3.0* 2.8* 2.8*     Lipase     Component Value Date/Time   LIPASE 11 09/17/2012 1011     Studies/Results: No results found.  Medications: . buPROPion  300 mg Oral Daily  . citalopram  40 mg Oral Daily  . enoxaparin (LOVENOX) injection  40 mg Subcutaneous Q24H  . ertapenem (INVANZ) IV  1 g Intravenous Q24H  . Glycerin (Adult)  3 suppository Rectal Once  . magnesium citrate  60 mL Oral BID  . pantoprazole (PROTONIX) IV  40 mg  Intravenous QHS  . sodium chloride  10-40 mL Intracatheter Q12H    Assessment/Plan 1. Diverticulitis with perforation verses stercoral ulceration and possible abscess      Day 8 today of  2. 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  3. Chronic and ongoing constipation since 09/06/12. She reports GI work-up and colonoscopy dating back to her teens.  4. Severe anxiety with depression/panic attacks, off her medicines since 09/06/12.  5. Long history of obesity, with weight loss in the past  6. Family history of breast cancer  Plan: Dr. Lilyan Punt started her on Mag citrate BID yesterday, and we are trying some glycerin suppositories in addition to that.  She feels much better and really wants to get out of here.  I will check on when we are going to repeat CT, last CT was 9/18.   In the meantime she is having some stool evacuation without any further pain or discomfort.    LOS: 7 days    Ethyl Vila 09/30/2012

## 2012-09-30 NOTE — Progress Notes (Signed)
PARENTERAL NUTRITION CONSULT NOTE - FOLLOW UP  Pharmacy Consult for TPN Indication: Bowel Obstruction  Allergies  Allergen Reactions  . Sulfa Antibiotics     Patient Measurements: Height: 5\' 9"  (175.3 cm) Weight: 328 lb 0.7 oz (148.8 kg) IBW/kg (Calculated) : 66.2 Adjusted Body Weight: 99.2kg Usual Weight: 148kg  Vital Signs: Temp: 97.9 [name] (36.6 C) (09/22 0606) Temp src: Oral (09/22 0606) BP: 123/85 mmHg (09/22 0606) Pulse Rate: 86 (09/22 0606) Intake/Output from previous day: 09/21 0701 - 09/22 0700 In: 10 [I.V.:10] Out: -   Labs:  Recent Labs  09/28/12 0545 09/30/12 0545  WBC 10.5 10.2  HGB 10.3* 10.7*  HCT 33.2* 34.6*  PLT 370 358     Recent Labs  09/27/12 1230 09/28/12 0545 09/29/12 0615 09/30/12 0545  NA  --  138 136 138  K  --  4.4 3.9 4.1  CL  --  102 101 100  CO2  --  28 29 29   GLUCOSE  --  114* 111* 101*  BUN  --  7 9 12   CREATININE  --  0.95 0.87 0.87  CALCIUM  --  9.2 9.3 9.3  MG  --  2.1 2.1 2.5  PHOS  --  4.2 4.5 4.4  PROT  --  7.1  --  7.5  ALBUMIN  --  2.8*  --  2.8*  AST  --  17  --  16  ALT  --  7  --  10  ALKPHOS  --  104  --  104  BILITOT  --  0.2*  --  0.2*  PREALBUMIN 10.7* 10.9*  --   --   TRIG  --  133  --  159*   Estimated Creatinine Clearance: 119.8 ml/min (by C-G formula based on Cr of 0.87).    Recent Labs  09/29/12 1743 09/30/12 0053 09/30/12 0601  GLUCAP 119* 112* 116*    Medications:  Scheduled:  . buPROPion  300 mg Oral Daily  . citalopram  40 mg Oral Daily  . enoxaparin (LOVENOX) injection  40 mg Subcutaneous Q24H  . ertapenem (INVANZ) IV  1 g Intravenous Q24H  . magnesium citrate  60 mL Oral BID  . pantoprazole (PROTONIX) IV  40 mg Intravenous QHS  . sodium chloride  10-40 mL Intracatheter Q12H   Infusions:  . dextrose 5% lactated ringers with KCl 20 mEq/L 30 mL/hr at 09/29/12 1753  . Marland KitchenTPN (CLINIMIX-E) Adult 70 mL/hr at 09/29/12 1702   And  . fat emulsion 250 mL (09/29/12 1702)     Nutritional Goals:   RD recommendations (9/16 ):  1600-1700 Kcal, 65-80g Protein, 1.6-1.7 L fluid/day  Clinimix E5/15 at a goal rate of 70 ml/hr + IVFE 20% at 10 ml/hr daily will provide: 84 g/day protein, 1672 Kcal/day.  Current nutrition:   Diet: NPO  IVF: D5LR + 20KCl @ 40 ml/hr  Clinimix @ 32ml/hr + Lipids @ 62ml/hr  CBGs & Insulin requirements past 24 hours:  CBGs: < 150 No insulin currently ordered.  Assessment:  52 yo [name] with long history of chronic constipation and currently reports no BM since 8/29, now with bowel obstruction.  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 Dr Barry Dienes wants to continue medical management with ice chips and start TNA 9/19.  Labs  Renal, Electrolytes: SCr and electrolytes are wnl.   CrCl > 100 ml/min.  Hepatic  function: AST/ALT wnl   Pre-Albumin: 10.7 (9/19), 10.9 (9/20)  TG:   133 (9/20), 159 (9/22)  Glucose: <150  TPN Access: PICC line placed 9/19  TPN day#:  4  Plan:  At 1800 today:  Continue Clinimix E 5/15 at 70 ml/hr.  20% fat emulsion at 10 ml/hr.  TNA to contain standard multivitamins and trace elements daily.  Reduce IVF to 30 ml/hr.  Since CBGs controlled, reduce checks to q8h.    TNA lab panels on Mondays & Thursdays.  BMET in AM.   Hershal Coria, PharmD, BCPS Pager: [phone] 09/30/2012 10:19 AM

## 2012-09-30 NOTE — Progress Notes (Signed)
NUTRITION FOLLOW UP  Intervention:   - TPN per pharmacy - Diet advancement per MD - Will continue to monitor   Nutrition Dx:   Inadequate oral intake related to inability to eat as evidenced by NPO - ongoing   New goal: TPN to meet >90% of estimated nutritional needs - met   Monitor:   Weights, labs, TPN, BM, ? surgery  Assessment:   Me with pt who reports having 2 small BMs yesterday. Remains NPO getting TPN. No new weights. Reports she feels like she is getting stronger, especially when getting up and going to the bathroom.   Current TPN: Clinimix E5/15 at goal rate of 70 ml/hr + IVFE 20% at 10 ml/hr daily will provide: 84 g/day protein, 1672 Kcal/day - meets 104% estimated calorie needs, 105% estimated protein needs  - PALB low - CBGs < 150 mg/dL - GFR low but improving slightly - Triglycerides slightly elevated  Height: Ht Readings from Last 1 Encounters:  09/24/12 5\' 9"  (1.753 m)    Weight Status:   Wt Readings from Last 1 Encounters:  09/23/12 328 lb 0.7 oz (148.8 kg)    Re-estimated needs:  Kcal: 1600-1700 Protein: 80-90g Fluid: 1.6-1.7L/day  Skin: Non-pitting RLE, LLE edema  Diet Order: NPO   Intake/Output Summary (Last 24 hours) at 09/30/12 1231 Last data filed at 09/29/12 2200  Gross per 24 hour  Intake     10 ml  Output      0 ml  Net     10 ml    Last BM: 8/29   Labs:   Recent Labs Lab 09/28/12 0545 09/29/12 0615 09/30/12 0545  NA 138 136 138  K 4.4 3.9 4.1  CL 102 101 100  CO2 28 29 29   BUN 7 9 12   CREATININE 0.95 0.87 0.87  CALCIUM 9.2 9.3 9.3  MG 2.1 2.1 2.5  PHOS 4.2 4.5 4.4  GLUCOSE 114* 111* 101*    CBG (last 3)   Recent Labs  09/29/12 1743 09/30/12 0053 09/30/12 0601  GLUCAP 119* 112* 116*    Scheduled Meds: . buPROPion  300 mg Oral Daily  . citalopram  40 mg Oral Daily  . enoxaparin (LOVENOX) injection  40 mg Subcutaneous Q24H  . ertapenem (INVANZ) IV  1 g Intravenous Q24H  . magnesium citrate  60 mL Oral  BID  . pantoprazole (PROTONIX) IV  40 mg Intravenous QHS  . sodium chloride  10-40 mL Intracatheter Q12H    Continuous Infusions: . dextrose 5% lactated ringers with KCl 20 mEq/L 30 mL/hr at 09/29/12 1753  . Marland KitchenTPN (CLINIMIX-E) Adult     And  . fat emulsion    . Marland KitchenTPN (CLINIMIX-E) Adult 70 mL/hr at 09/29/12 1702   And  . fat emulsion 250 mL (09/29/12 1702)     Mikey College MS, RD, LDN [phone] Pager [phone] After Hours Pager

## 2012-10-01 LAB — BASIC METABOLIC PANEL
BUN: 14 mg/dL (ref 6–23)
CO2: 29 mEq/L (ref 19–32)
Chloride: 99 mEq/L (ref 96–112)
Creatinine, Ser: 0.85 mg/dL (ref 0.50–1.10)
GFR calc Af Amer: >90 mL/min (ref >90)

## 2012-10-01 LAB — GLUCOSE, CAPILLARY
Glucose-Capillary: 101 mg/dL — ABNORMAL HIGH (ref 70–99)
Glucose-Capillary: 105 mg/dL — ABNORMAL HIGH (ref 70–99)
Glucose-Capillary: 106 mg/dL — ABNORMAL HIGH (ref 70–99)

## 2012-10-01 MED ORDER — FLEET ENEMA 7-19 GM/118ML RE ENEM
1.0000 | ENEMA | Freq: Once | RECTAL | Status: AC
  Administered 2012-10-01: 1 via RECTAL
  Filled 2012-10-01: qty 1

## 2012-10-01 MED ORDER — FAT EMULSION 20 % IV EMUL
240.0000 mL | INTRAVENOUS | Status: AC
  Administered 2012-10-01: 240 mL via INTRAVENOUS
  Filled 2012-10-01: qty 250

## 2012-10-01 MED ORDER — KCL-LACTATED RINGERS-D5W 20 MEQ/L IV SOLN
INTRAVENOUS | Status: DC
  Administered 2012-10-02: 20:00:00 via INTRAVENOUS
  Administered 2012-10-04: 20 mL/h via INTRAVENOUS
  Filled 2012-10-01 (×4): qty 1000

## 2012-10-01 MED ORDER — TRACE MINERALS CR-CU-F-FE-I-MN-MO-SE-ZN IV SOLN
INTRAVENOUS | Status: AC
  Administered 2012-10-01: 18:00:00 via INTRAVENOUS
  Filled 2012-10-01: qty 2000

## 2012-10-01 NOTE — Progress Notes (Signed)
PARENTERAL NUTRITION CONSULT NOTE - FOLLOW UP  Pharmacy Consult for TPN Indication: Bowel Obstruction  Allergies  Allergen Reactions  . Sulfa Antibiotics     Patient Measurements: Height: 5\' 9"  (175.3 cm) Weight: 328 lb 0.7 oz (148.8 kg) IBW/kg (Calculated) : 66.2 Adjusted Body Weight: 99.2kg Usual Weight: 148kg  Vital Signs: Temp: 97.7 F (36.5 C) (09/23 0700) Temp src: Oral (09/23 0700) BP: 162/82 mmHg (09/23 0700) Pulse Rate: 76 (09/23 0700) Intake/Output from previous day: 09/22 0701 - 09/23 0700 In: 410 [I.V.:360; IV Piggyback:50] Out: -   Labs:  Recent Labs  09/30/12 0545  WBC 10.2  HGB 10.7*  HCT 34.6*  PLT 358     Recent Labs  09/29/12 0615 09/30/12 0545 10/01/12 0405  NA 136 138 138  K 3.9 4.1 3.5  CL 101 100 99  CO2 29 29 29   GLUCOSE 111* 101* 108*  BUN 9 12 14   CREATININE 0.87 0.87 0.85  CALCIUM 9.3 9.3 9.3  MG 2.1 2.5  --   PHOS 4.5 4.4  --   PROT  --  7.5  --   ALBUMIN  --  2.8*  --   AST  --  16  --   ALT  --  10  --   ALKPHOS  --  104  --   BILITOT  --  0.2*  --   PREALBUMIN  --  14.1*  --   TRIG  --  159*  --    Estimated Creatinine Clearance: 122.6 ml/min (by C-G formula based on Cr of 0.85).    Recent Labs  09/30/12 0601 09/30/12 1822 10/01/12 0058  GLUCAP 116* 115* 105*    Medications:  Scheduled:  . buPROPion  300 mg Oral Daily  . citalopram  40 mg Oral Daily  . enoxaparin (LOVENOX) injection  40 mg Subcutaneous Q24H  . ertapenem (INVANZ) IV  1 g Intravenous Q24H  . pantoprazole (PROTONIX) IV  40 mg Intravenous QHS  . sodium chloride  10-40 mL Intracatheter Q12H  . sodium phosphate  1 enema Rectal Once   Infusions:  . dextrose 5% lactated ringers with KCl 20 mEq/L 30 mL/hr at 10/01/12 0351  . Marland KitchenTPN (CLINIMIX-E) Adult 70 mL/hr at 09/30/12 1741   And  . fat emulsion 240 mL (09/30/12 1741)    Nutritional Goals:   RD recommendations (9/22):  1600-1700 Kcal, 80-90g Protein, 1.6-1.7 L fluid/day  Clinimix  E5/15 at a goal rate of 70 ml/hr + IVFE 20% at 10 ml/hr daily will provide: 84 g/day protein, 1672 Kcal/day.  Current nutrition:   Diet: NPO  IVF: D5LR + 20KCl @ 30 ml/hr  Clinimix @ 37ml/hr + Lipids @ 71ml/hr  CBGs & Insulin requirements past 24 hours:  CBGs: < 150 No insulin currently ordered.  Assessment:  52 yo F with long history of chronic constipation presents with bowel obstruction.  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 Dr Barry Dienes wants to continue medical management with ice chips and start TNA 9/19.  Labs  Renal, Electrolytes: SCr and electrolytes are wnl.   CrCl > 100 ml/min.  Hepatic function: AST/ALT wnl   Pre-Albumin: 10.7 (9/19), 10.9 (9/20)  TG:   133 (9/20), 159 (9/22)  Glucose: <150  TPN Access: PICC line placed 9/19  TPN day#: 5.  Patient remains NPO with possibility of surgery if pain does not improve.  Plan:  At 1800 today:  Continue Clinimix E 5/15 at 70 ml/hr and 20% fat emulsion at 10 ml/hr.  TNA to contain standard multivitamins and trace elements daily.  Reduce IVF to [facility] with TNA at goal rate providing 1920 ml/day.  Continue CBGs q8h.    TNA lab panels on Mondays & Thursdays.  Labs stable, will f/u TNA lab panel on Thursday.  Hershal Coria, PharmD, BCPS Pager: [phone] 10/01/2012 10:54 AM

## 2012-10-01 NOTE — Progress Notes (Signed)
Subjective: Pt had some acute pain last night.  4+BM's yesterday and flatus.  Pending fleet enema today.  Pain well controlled now.  No N/V.  Ambulating OOB.  NPO.  Objective: Vital signs in last 24 hours: Temp:  [97.2 F (36.2 C)-98.4 F (36.9 C)] 97.7 F (36.5 C) (09/23 0700) Pulse Rate:  [76-81] 76 (09/23 0700) Resp:  [18] 18 (09/23 0700) BP: (127-162)/(78-82) 162/82 mmHg (09/23 0700) SpO2:  [93 %-96 %] 95 % (09/23 0700) Last BM Date: 10/01/12  Intake/Output from previous day: 09/22 0701 - 09/23 0700 In: 410 [I.V.:360; IV Piggyback:50] Out: -  Intake/Output this shift:    PE: Gen:  Alert, NAD, pleasant Abd: Soft, mild distension, moderate tenderness in LLQ, +BS, no HSM   Lab Results:   Recent Labs  09/30/12 0545  WBC 10.2  HGB 10.7*  HCT 34.6*  PLT 358   BMET  Recent Labs  09/30/12 0545 10/01/12 0405  NA 138 138  K 4.1 3.5  CL 100 99  CO2 29 29  GLUCOSE 101* 108*  BUN 12 14  CREATININE 0.87 0.85  CALCIUM 9.3 9.3   PT/INR No results found for this basename: LABPROT, INR,  in the last 72 hours CMP     Component Value Date/Time   NA 138 10/01/2012 0405   K 3.5 10/01/2012 0405   CL 99 10/01/2012 0405   CO2 29 10/01/2012 0405   GLUCOSE 108* 10/01/2012 0405   BUN 14 10/01/2012 0405   CREATININE 0.85 10/01/2012 0405   CALCIUM 9.3 10/01/2012 0405   PROT 7.5 09/30/2012 0545   ALBUMIN 2.8* 09/30/2012 0545   AST 16 09/30/2012 0545   ALT 10 09/30/2012 0545   ALKPHOS 104 09/30/2012 0545   BILITOT 0.2* 09/30/2012 0545   GFRNONAA 78* 10/01/2012 0405   GFRAA >90 10/01/2012 0405   Lipase     Component Value Date/Time   LIPASE 11 09/17/2012 1011       Studies/Results: No results found.  Anti-infectives: Anti-infectives   Start     Dose/Rate Route Frequency Ordered Stop   09/23/12 2000  ertapenem (INVANZ) 1 g in sodium chloride 0.9 % 50 mL IVPB     1 g 100 mL/hr over 30 Minutes Intravenous Every 24 hours 09/23/12 1836     09/23/12 1500  ciprofloxacin  (CIPRO) IVPB 400 mg     400 mg 200 mL/hr over 60 Minutes Intravenous  Once 09/23/12 1445 09/23/12 1759   09/23/12 1500  metroNIDAZOLE (FLAGYL) IVPB 500 mg     500 mg 100 mL/hr over 60 Minutes Intravenous  Once 09/23/12 1445 09/23/12 1635       Assessment/Plan LLQ abdominal pain Diverticulitis with perforation verses stercoral ulceration and possible abscess Day 9  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  Chronic and ongoing constipation since 09/06/12. She reports GI work-up and colonoscopy dating back to her teens.  Severe anxiety with depression/panic attacks, off her medicines since 09/06/12.  Long history of obesity, with weight loss in the past  Family history of breast cancer   Plan:   1.  Low volume fleet enema, need to encourage slow prep 2.  Continue conservative treatment for now NPO, IVF, pain control, antiemetics, antibiotics (Invanz) 3.  SCD's and lovenox 4.  Ambulate and IS 5.  Consider repeat CT on Thursday 9/25, last CT was 9/18.  6.  In the meantime she is having some stool evacuation with enema's/suppositories 7.  Discussed need for surgical  intervention if pain doesn't improve and possibility of an ostomy       LOS: 8 days    DORT, [name] 10/01/2012, 8:48 AM Pager: [phone]

## 2012-10-01 NOTE — Progress Notes (Signed)
Will hold more mag citrate for now and make sure she opens up from below

## 2012-10-02 DIAGNOSIS — K56609 Unspecified intestinal obstruction, unspecified as to partial versus complete obstruction: Secondary | ICD-10-CM

## 2012-10-02 DIAGNOSIS — K5900 Constipation, unspecified: Secondary | ICD-10-CM

## 2012-10-02 LAB — GLUCOSE, CAPILLARY
Glucose-Capillary: 119 mg/dL — ABNORMAL HIGH (ref 70–99)
Glucose-Capillary: 128 mg/dL — ABNORMAL HIGH (ref 70–99)
Glucose-Capillary: 116 mg/dL — ABNORMAL HIGH (ref 70–99)

## 2012-10-02 MED ORDER — FAT EMULSION 20 % IV EMUL
240.0000 mL | INTRAVENOUS | Status: DC
  Administered 2012-10-02: 240 mL via INTRAVENOUS
  Filled 2012-10-02: qty 250

## 2012-10-02 MED ORDER — POLYETHYLENE GLYCOL 3350 17 G PO PACK
17.0000 g | PACK | Freq: Two times a day (BID) | ORAL | Status: DC
  Administered 2012-10-02 – 2012-10-05 (×7): 17 g via ORAL
  Filled 2012-10-02 (×8): qty 1

## 2012-10-02 MED ORDER — TRACE MINERALS CR-CU-F-FE-I-MN-MO-SE-ZN IV SOLN
INTRAVENOUS | Status: DC
  Administered 2012-10-02: 17:00:00 via INTRAVENOUS
  Filled 2012-10-02: qty 2000

## 2012-10-02 NOTE — Progress Notes (Signed)
Nutrition Education Note  Received consult for education on obesity, diverticulitis, and significant constipation. Met with pt and provided handouts on these topics. Encouraged gradually increasing fiber intake to help with constipation however discouraged high fiber intake when pt having acute diverticulitis. Healthy diet discussed and healthy sample meal plans reviewed. Teach back method used. Pt expressed understanding and appreciation.   Mikey College MS, [place], [place] Pager [phone] After Hours Pager

## 2012-10-02 NOTE — Progress Notes (Signed)
PARENTERAL NUTRITION CONSULT NOTE - FOLLOW UP  Pharmacy Consult for TPN Indication: Bowel Obstruction  Allergies  Allergen Reactions  . Sulfa Antibiotics     Patient Measurements: Height: 5\' 9"  (175.3 cm) Weight: 328 lb 0.7 oz (148.8 kg) IBW/kg (Calculated) : 66.2 Adjusted Body Weight: 99.2kg Usual Weight: 148kg  Vital Signs: Temp: 97.6 F (36.4 C) (09/24 0527) Temp src: Oral (09/24 0527) BP: 128/73 mmHg (09/24 0527) Pulse Rate: 78 (09/24 0527) Intake/Output from previous day: 09/23 0701 - 09/24 0700 In: 7031.5 [P.O.:60; I.V.:59.7; IV Piggyback:50; [id number] Out: -   Labs:  Recent Labs  09/30/12 0545  WBC 10.2  HGB 10.7*  HCT 34.6*  PLT 358     Recent Labs  09/30/12 0545 10/01/12 0405  NA 138 138  K 4.1 3.5  CL 100 99  CO2 29 29  GLUCOSE 101* 108*  BUN 12 14  CREATININE 0.87 0.85  CALCIUM 9.3 9.3  MG 2.5  --   PHOS 4.4  --   PROT 7.5  --   ALBUMIN 2.8*  --   AST 16  --   ALT 10  --   ALKPHOS 104  --   BILITOT 0.2*  --   PREALBUMIN 14.1*  --   TRIG 159*  --    Estimated Creatinine Clearance: 122.6 ml/min (by C-G formula based on Cr of 0.85).    Recent Labs  10/01/12 0058 10/01/12 1611 10/01/12 2310  GLUCAP 105* 106* 101*    Medications:  Scheduled:  . buPROPion  300 mg Oral Daily  . citalopram  40 mg Oral Daily  . enoxaparin (LOVENOX) injection  40 mg Subcutaneous Q24H  . ertapenem (INVANZ) IV  1 g Intravenous Q24H  . pantoprazole (PROTONIX) IV  40 mg Intravenous QHS  . polyethylene glycol  17 g Oral BID  . sodium chloride  10-40 mL Intracatheter Q12H   Infusions:  . dextrose 5% lactated ringers with KCl 20 mEq/L 20 mL/hr at 10/01/12 1856  . Marland KitchenTPN (CLINIMIX-E) Adult 70 mL/hr at 10/01/12 1749   And  . fat emulsion 240 mL (10/01/12 1749)    Nutritional Goals:   RD recommendations (9/22):  1600-1700 Kcal, 80-90g Protein, 1.6-1.7 L fluid/day  Clinimix E5/15 at a goal rate of 70 ml/hr + IVFE 20% at 10 ml/hr daily will provide:  84 g/day protein, 1672 Kcal/day.  Current nutrition:   Diet: NPO  IVF: D5LR + 20KCl @ KVO  Clinimix @ 45ml/hr + Lipids @ 34ml/hr  CBGs & Insulin requirements past 24 hours:  CBGs: < 150 No insulin currently ordered.  Assessment:  52 yo F with long history of chronic constipation presents with bowel obstruction.  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 TNA was started 9/19.  Labs  Renal, Electrolytes: SCr and electrolytes wnl (9/23).   CrCl > 100 ml/min.  Hepatic function: AST/ALT below ULN (9/22)  Pre-Albumin: 10.7 (9/19), 10.9 (9/20)  TG:   133 (9/20), 159 (9/22)  Glucose: <150 (9/23)  TPN Access: PICC line placed 9/19  TPN day#: 6.  Patient remains NPO with possibility of surgery if pain does not improve.  Plan:  At 1800 today:  Continue Clinimix E 5/15 at 70 ml/hr and 20% fat emulsion at 10 ml/hr.  TNA to contain standard multivitamins and trace elements daily.  Continue CBGs q8h.    TNA lab panels on Mondays & Thursdays.  Clayburn Pert, PharmD, BCPS Pager: [phone] 10/02/2012  7:46 AM

## 2012-10-02 NOTE — Progress Notes (Signed)
Subjective: Pt feels good, more BMs yesterday after fleet enema.  Pt denies any pain, N/V.  Urinating well.  Ambulating some OOB.    Objective: Vital signs in last 24 hours: Temp:  [97.4 F (36.3 C)-98.2 F (36.8 C)] 97.6 F (36.4 C) (09/24 0527) Pulse Rate:  [76-88] 78 (09/24 0527) Resp:  [20] 20 (09/24 0527) BP: (105-130)/(52-84) 128/73 mmHg (09/24 0527) SpO2:  [94 %-99 %] 94 % (09/24 0527) Last BM Date: 10/01/12  Intake/Output from previous day: 09/23 0701 - 09/24 0700 In: 7999.8 [P.O.:120; I.V.:241.3; IV Piggyback:50; TPN:7588.5] Out: -  Intake/Output this shift:    PE: Gen:  Alert, NAD, pleasant Abd: Soft, NT/ND, +BS, no HSM  Lab Results:   Recent Labs  09/30/12 0545  WBC 10.2  HGB 10.7*  HCT 34.6*  PLT 358   BMET  Recent Labs  09/30/12 0545 10/01/12 0405  NA 138 138  K 4.1 3.5  CL 100 99  CO2 29 29  GLUCOSE 101* 108*  BUN 12 14  CREATININE 0.87 0.85  CALCIUM 9.3 9.3   PT/INR No results found for this basename: LABPROT, INR,  in the last 72 hours CMP     Component Value Date/Time   NA 138 10/01/2012 0405   K 3.5 10/01/2012 0405   CL 99 10/01/2012 0405   CO2 29 10/01/2012 0405   GLUCOSE 108* 10/01/2012 0405   BUN 14 10/01/2012 0405   CREATININE 0.85 10/01/2012 0405   CALCIUM 9.3 10/01/2012 0405   PROT 7.5 09/30/2012 0545   ALBUMIN 2.8* 09/30/2012 0545   AST 16 09/30/2012 0545   ALT 10 09/30/2012 0545   ALKPHOS 104 09/30/2012 0545   BILITOT 0.2* 09/30/2012 0545   GFRNONAA 78* 10/01/2012 0405   GFRAA >90 10/01/2012 0405   Lipase     Component Value Date/Time   LIPASE 11 09/17/2012 1011       Studies/Results: No results found.  Anti-infectives: Anti-infectives   Start     Dose/Rate Route Frequency Ordered Stop   09/23/12 2000  ertapenem (INVANZ) 1 g in sodium chloride 0.9 % 50 mL IVPB     1 g 100 mL/hr over 30 Minutes Intravenous Every 24 hours 09/23/12 1836     09/23/12 1500  ciprofloxacin (CIPRO) IVPB 400 mg     400 mg 200 mL/hr over  60 Minutes Intravenous  Once 09/23/12 1445 09/23/12 1759   09/23/12 1500  metroNIDAZOLE (FLAGYL) IVPB 500 mg     500 mg 100 mL/hr over 60 Minutes Intravenous  Once 09/23/12 1445 09/23/12 1635       Assessment/Plan LLQ abdominal pain  Diverticulitis with perforation verses stercoral ulceration and possible abscess Day 10 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  Chronic and ongoing constipation since 09/06/12. She reports GI work-up and colonoscopy dating back to her teens.  Severe anxiety with depression/panic attacks, off her medicines since 09/06/12.  Long history of obesity, with weight loss in the past  Family history of breast cancer   Plan:  1. Fleet enema had good result yesterday, Start Miralax BID, may need golytely tomorrow 2. Continue conservative treatment for now NPO, IVF, pain control, antiemetics, antibiotics (Invanz)  3. SCD's and lovenox  4. Ambulate and IS  5. Consider repeat CT on Thursday or Friday (when sufficiently cleaned out), last CT was 9/18.  6. In the meantime she is having some stool evacuation with enema's/suppositories  7. Discussed need for surgical intervention if pain doesn't improve  and possibility of an ostomy  8. Plan would be to get repeat CT and if no leak, start advancing diet and taper TPN, maybe home by the weekend or Monday 9.  Ordered dietitian consult    LOS: 9 days    DORT, Nadalie Laughner 10/02/2012, 9:13 AM Pager: [phone]

## 2012-10-02 NOTE — Plan of Care (Signed)
Problem: Phase II Progression Outcomes Goal: Progress activity as tolerated unless otherwise ordered Outcome: Completed/Met Date Met:  10/02/12 Ambulated in hallway this pm 10/01/12

## 2012-10-02 NOTE — Plan of Care (Signed)
Problem: Phase II Progression Outcomes Goal: Other Phase II Outcomes/Goals Outcome: Completed/Met Date Met:  10/02/12 Patient reports had another bowel movement tonight 10/01/12

## 2012-10-03 ENCOUNTER — Inpatient Hospital Stay (HOSPITAL_COMMUNITY): Payer: 59

## 2012-10-03 ENCOUNTER — Encounter (HOSPITAL_COMMUNITY): Payer: Self-pay | Admitting: Radiology

## 2012-10-03 LAB — COMPREHENSIVE METABOLIC PANEL
ALT: 24 U/L (ref 0–35)
AST: 25 U/L (ref 0–37)
Albumin: 2.8 g/dL — ABNORMAL LOW (ref 3.5–5.2)
Alkaline Phosphatase: 106 U/L (ref 39–117)
BUN: 17 mg/dL (ref 6–23)
Calcium: 9.3 mg/dL (ref 8.4–10.5)
Chloride: 99 mEq/L (ref 96–112)
Glucose, Bld: 110 mg/dL — ABNORMAL HIGH (ref 70–99)
Potassium: 3.7 mEq/L (ref 3.5–5.1)
Sodium: 137 mEq/L (ref 135–145)
Total Bilirubin: 0.2 mg/dL — ABNORMAL LOW (ref 0.3–1.2)

## 2012-10-03 LAB — CBC
Hemoglobin: 10.7 g/dL — ABNORMAL LOW (ref 12.0–15.0)
MCH: 25.4 pg — ABNORMAL LOW (ref 26.0–34.0)
MCV: 80 fL (ref 78.0–100.0)
Platelets: 316 10*3/uL (ref 150–400)
RBC: 4.21 MIL/uL (ref 3.87–5.11)
WBC: 9.5 10*3/uL (ref 4.0–10.5)

## 2012-10-03 LAB — GLUCOSE, CAPILLARY
Glucose-Capillary: 130 mg/dL — ABNORMAL HIGH (ref 70–99)
Glucose-Capillary: 86 mg/dL (ref 70–99)

## 2012-10-03 LAB — MAGNESIUM: Magnesium: 2.1 mg/dL (ref 1.5–2.5)

## 2012-10-03 MED ORDER — CIPROFLOXACIN HCL 500 MG PO TABS
500.0000 mg | ORAL_TABLET | Freq: Two times a day (BID) | ORAL | Status: DC
  Administered 2012-10-03 – 2012-10-05 (×4): 500 mg via ORAL
  Filled 2012-10-03 (×6): qty 1

## 2012-10-03 MED ORDER — IOHEXOL 300 MG/ML  SOLN
100.0000 mL | Freq: Once | INTRAMUSCULAR | Status: AC | PRN
  Administered 2012-10-03: 100 mL via INTRAVENOUS

## 2012-10-03 MED ORDER — FAT EMULSION 20 % IV EMUL
240.0000 mL | INTRAVENOUS | Status: DC
  Filled 2012-10-03: qty 250

## 2012-10-03 MED ORDER — METRONIDAZOLE 500 MG PO TABS
500.0000 mg | ORAL_TABLET | Freq: Three times a day (TID) | ORAL | Status: DC
  Administered 2012-10-03 – 2012-10-05 (×6): 500 mg via ORAL
  Filled 2012-10-03 (×9): qty 1

## 2012-10-03 MED ORDER — TRACE MINERALS CR-CU-F-FE-I-MN-MO-SE-ZN IV SOLN
INTRAVENOUS | Status: DC
  Filled 2012-10-03: qty 2000

## 2012-10-03 MED ORDER — IOHEXOL 300 MG/ML  SOLN: 25.0000 mL | Freq: Once | INTRAMUSCULAR | Status: AC | PRN

## 2012-10-03 NOTE — Progress Notes (Signed)
CT reviewed and 2 cm abscess still present.  Segmental "colitis" noted.  Certainly not worse.  Will give oral cipro and flagyl and start clear liquids PO.  TNA stopped.  Matt B. Hassell Done, MD, [facility] [phone] beeper [phone]  10/03/2012 2:16 PM

## 2012-10-03 NOTE — Progress Notes (Signed)
PARENTERAL NUTRITION CONSULT NOTE - FOLLOW UP  Pharmacy Consult for TPN Indication: Bowel Obstruction  Allergies  Allergen Reactions  . Sulfa Antibiotics    Patient Measurements: Height: 5\' 9"  (175.3 cm) Weight: 328 lb 0.7 oz (148.8 kg) IBW/kg (Calculated) : 66.2 Adjusted Body Weight: 99.2kg Usual Weight: 148kg  Vital Signs: Temp: 97.5 F (36.4 C) (09/25 0654) Temp src: Oral (09/25 0654) BP: 144/68 mmHg (09/25 0654) Pulse Rate: 77 (09/25 0654) Intake/Output from previous day: 09/24 0701 - 09/25 0700 In: 2300 [I.V.:460; TPN:1840] Out: -   Labs:  Recent Labs  10/03/12 0500  WBC 9.5  HGB 10.7*  HCT 33.7*  PLT 316    Recent Labs  10/01/12 0405 10/03/12 0500  NA 138 137  K 3.5 3.7  CL 99 99  CO2 29 26  GLUCOSE 108* 110*  BUN 14 17  CREATININE 0.85 0.82  CALCIUM 9.3 9.3  MG  --  2.1  PHOS  --  4.6  PROT  --  7.3  ALBUMIN  --  2.8*  AST  --  25  ALT  --  24  ALKPHOS  --  106  BILITOT  --  0.2*   Estimated Creatinine Clearance: 127.1 ml/min (by C-G formula based on Cr of 0.82).   Recent Labs  10/02/12 0400 10/02/12 1136 10/02/12 2253  GLUCAP 119* 128* 116*   Medications:  Scheduled:  . buPROPion  300 mg Oral Daily  . citalopram  40 mg Oral Daily  . enoxaparin (LOVENOX) injection  40 mg Subcutaneous Q24H  . ertapenem (INVANZ) IV  1 g Intravenous Q24H  . pantoprazole (PROTONIX) IV  40 mg Intravenous QHS  . polyethylene glycol  17 g Oral BID  . sodium chloride  10-40 mL Intracatheter Q12H   Infusions:  . dextrose 5% lactated ringers with KCl 20 mEq/L 20 mL/hr at 10/02/12 2029  . Marland KitchenTPN (CLINIMIX-E) Adult 70 mL/hr at 10/02/12 1714   And  . fat emulsion 240 mL (10/02/12 1830)    Nutritional Goals:   RD recommendations (9/22):  1600-1700 Kcal, 80-90g Protein, 1.6-1.7 L fluid/day  Clinimix E5/15 at a goal rate of 70 ml/hr + IVFE 20% at 10 ml/hr daily will provide: 84 g/day protein, 1672 Kcal/day.  Current nutrition:   Diet: NPO  IVF: D5LR  + 20KCl @ KVO  Clinimix E 5/15 @ 80ml/hr + Lipids @ 76ml/hr  CBGs & Insulin requirements past 24 hours:  CBGs: < 130 (avg 110's) No insulin currently ordered.  Assessment:  52 yo F with long history of chronic constipation presents with bowel obstruction.  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 TNA was started 9/19.  Labs  Renal, Electrolytes: SCr and electrolytes wnl (9/25).   CrCl > 100 ml/min.  Hepatic function: AST/ALT below ULN (9/22)  Pre-Albumin: 10.7 (9/19), 10.9 (9/20), 14.1 (9/22)  TG:   133 (9/20), 159 (9/22)  Glucose: <150 (9/23)  TPN Access: PICC line placed 9/19  TPN day#: 7.  Patient remains NPO with possibility of surgery if pain does not improve ( no Morphine since 9/23). Plan rpt CT: if no leak, advance diet, taper TNA  Day 11 of Invanz, UOP not measured  Plan:  At 1800 today:  Continue Clinimix E 5/15 at 70 ml/hr and 20% fat emulsion at 10 ml/hr.  Await repeat CT scan, results, plan.  TNA to contain standard multivitamins and trace  elements daily.  Continue CBGs q8h.    TNA lab panels on Mondays & Thursdays.  Minda Ditto PharmD Pager [phone] 10/03/2012, 8:04 AM

## 2012-10-03 NOTE — Progress Notes (Signed)
Patient ID: [name], female   DOB: 1960/12/12, 52 y.o.   MRN: [id number]    Subjective: Pt feeling much better today.  Pain is essentially gone.  Hungry.  Objective: Vital signs in last 24 hours: Temp:  [97.4 F (36.3 C)-98.5 F (36.9 C)] 98.5 F (36.9 C) (09/25 0824) Pulse Rate:  [72-86] 72 (09/25 0824) Resp:  [18-20] 20 (09/25 0654) BP: (105-144)/(68-93) 124/72 mmHg (09/25 0824) SpO2:  [94 %-98 %] 97 % (09/25 0824) Last BM Date: 10/02/12  Intake/Output from previous day: 09/24 0701 - 09/25 0700 In: 2300 [I.V.:460; TPN:1840] Out: -  Intake/Output this shift:    PE: Abd: soft, essentially NT, Nd, obese, +BS Heart: regular  Lab Results:   Recent Labs  10/03/12 0500  WBC 9.5  HGB 10.7*  HCT 33.7*  PLT 316   BMET  Recent Labs  10/01/12 0405 10/03/12 0500  NA 138 137  K 3.5 3.7  CL 99 99  CO2 29 26  GLUCOSE 108* 110*  BUN 14 17  CREATININE 0.85 0.82  CALCIUM 9.3 9.3   PT/INR No results found for this basename: LABPROT, INR,  in the last 72 hours CMP     Component Value Date/Time   NA 137 10/03/2012 0500   K 3.7 10/03/2012 0500   CL 99 10/03/2012 0500   CO2 26 10/03/2012 0500   GLUCOSE 110* 10/03/2012 0500   BUN 17 10/03/2012 0500   CREATININE 0.82 10/03/2012 0500   CALCIUM 9.3 10/03/2012 0500   PROT 7.3 10/03/2012 0500   ALBUMIN 2.8* 10/03/2012 0500   AST 25 10/03/2012 0500   ALT 24 10/03/2012 0500   ALKPHOS 106 10/03/2012 0500   BILITOT 0.2* 10/03/2012 0500   GFRNONAA 81* 10/03/2012 0500   GFRAA >90 10/03/2012 0500   Lipase     Component Value Date/Time   LIPASE 11 09/17/2012 1011       Studies/Results: No results found.  Anti-infectives: Anti-infectives   Start     Dose/Rate Route Frequency Ordered Stop   09/23/12 2000  ertapenem (INVANZ) 1 g in sodium chloride 0.9 % 50 mL IVPB     1 g 100 mL/hr over 30 Minutes Intravenous Every 24 hours 09/23/12 1836     09/23/12 1500  ciprofloxacin (CIPRO) IVPB 400 mg     400 mg 200 mL/hr over 60  Minutes Intravenous  Once 09/23/12 1445 09/23/12 1759   09/23/12 1500  metroNIDAZOLE (FLAGYL) IVPB 500 mg     500 mg 100 mL/hr over 60 Minutes Intravenous  Once 09/23/12 1445 09/23/12 1635       Assessment/Plan  LLQ abdominal pain  Diverticulitis with perforation verses stercoral ulceration and possible abscess Day 11 Irregular stricture of the distal sigmoid colon on CT scan; uncertain etiology.  Chronic and ongoing constipation  Severe anxiety with depression/panic attacks PCM/TNA  Plan: 1. Patient feels better today.  Will we will repeat a CT scan today and see what type of progress she has made.  If findings are improved, hopefully she can be started on clear liquids. 2. Cont TNA, hopefully if we can advance diet, then we can start weaning TNA 3. Cont IV abx therapy, but suspect this can be converted to oral soon. 4. Will need daily bowel regimen with miralax for chronic constipation.   LOS: 10 days    Lejend Dalby E 10/03/2012, 10:43 AM Pager: [phone]

## 2012-10-04 LAB — GLUCOSE, CAPILLARY
Glucose-Capillary: 99 mg/dL (ref 70–99)
Glucose-Capillary: 117 mg/dL — ABNORMAL HIGH (ref 70–99)

## 2012-10-04 MED ORDER — DOCUSATE SODIUM 100 MG PO CAPS
200.0000 mg | ORAL_CAPSULE | Freq: Two times a day (BID) | ORAL | Status: DC
  Administered 2012-10-04 – 2012-10-05 (×3): 200 mg via ORAL
  Filled 2012-10-04 (×4): qty 2

## 2012-10-04 NOTE — Progress Notes (Signed)
Subjective: She is feeling better and happy to have clears, TNA has been discontinued.  +BM, and less pain.  Worries her abdomen is still "swollen."  Objective: Vital signs in last 24 hours: Temp:  [97.3 F (36.3 C)-97.8 F (36.6 C)] 97.6 F (36.4 C) (09/26 0648) Pulse Rate:  [65-81] 76 (09/26 0648) Resp:  [17-20] 17 (09/26 0648) BP: (113-147)/(59-80) 113/59 mmHg (09/26 0648) SpO2:  [95 %-98 %] 95 % (09/26 0648) Last BM Date: 10/03/12 3 stools yesterday Diet: clears Afebrile, VSS Labs OK, yesterday  Intake/Output from previous day: 09/25 0701 - 09/26 0700 In: 1597 [P.O.:420; I.V.:495.7; TPN:681.3] Out: -  Intake/Output this shift:    General appearance: alert, cooperative and no distress GI: soft, non-tender; bowel sounds normal; no masses,  no organomegaly  Lab Results:   Recent Labs  10/03/12 0500  WBC 9.5  HGB 10.7*  HCT 33.7*  PLT 316    BMET  Recent Labs  10/03/12 0500  NA 137  K 3.7  CL 99  CO2 26  GLUCOSE 110*  BUN 17  CREATININE 0.82  CALCIUM 9.3   PT/INR No results found for this basename: LABPROT, INR,  in the last 72 hours   Recent Labs Lab 09/28/12 0545 09/30/12 0545 10/03/12 0500  AST 17 16 25   ALT 7 10 24   ALKPHOS 104 104 106  BILITOT 0.2* 0.2* 0.2*  PROT 7.1 7.5 7.3  ALBUMIN 2.8* 2.8* 2.8*     Lipase     Component Value Date/Time   LIPASE 11 09/17/2012 1011     Studies/Results: Ct Abdomen Pelvis W Contrast  10/03/2012   CLINICAL DATA:  Intra-abdominal abscess with prior CT suspicious for perforation. Assess for progression.  EXAM: CT ABDOMEN AND PELVIS WITH CONTRAST  TECHNIQUE: Multidetector CT imaging of the abdomen and pelvis was performed using the standard protocol following bolus administration of intravenous contrast.  CONTRAST:  156mL OMNIPAQUE IOHEXOL 300 MG/ML  SOLN  COMPARISON:  September 26, 2012  FINDINGS: The previously suspected extraluminal air surrounding the sigmoid colon persists but is slightly  decreased. There is diffuse bowel wall thickening of the sigmoid colon consistent with colitis. The previously noted abscess in the left pelvis persists and measures 2.3 cm. The small bowel is normal.  The liver, spleen, pancreas, gallbladder, adrenal glands and kidneys are normal. There is no hydronephrosis bilaterally. Aorta is normal. There is no abdominal lymphadenopathy.  Fluid-filled bladder is normal. The uterus is normal. A rectal tube is identified. The visualized lung bases are clear. No acute abnormalities identified within the visualized bones.  IMPRESSION: The previously suspected extraluminal air surrounding the sigmoid colon persists but is slightly decreased. There is diffuse bowel wall thickening of the sigmoid colon consistent with colitis. The previously noted abscess in the left pelvis persists and measures 2.3 cm.   Electronically Signed   By: Abelardo Diesel   On: 10/03/2012 11:09    Medications: . buPROPion  300 mg Oral Daily  . ciprofloxacin  500 mg Oral BID  . citalopram  40 mg Oral Daily  . enoxaparin (LOVENOX) injection  40 mg Subcutaneous Q24H  . metroNIDAZOLE  500 mg Oral Q8H  . pantoprazole (PROTONIX) IV  40 mg Intravenous QHS  . polyethylene glycol  17 g Oral BID  . sodium chloride  10-40 mL Intracatheter Q12H    Assessment/Plan 1. Diverticulitis with perforation verses stercoral ulceration and possible abscess   (2cm abscess still present, with segmental colitis) on CT yesterday 2. Irregular stricture of  the distal sigmoid colon on CT scan; uncertain etiology.  3. Chronic and ongoing constipation since 09/06/12. She reports GI work-up and colonoscopy dating back to her teens.  4. Severe anxiety with depression/panic attacks, off her medicines since 09/06/12.  5. Long history of obesity, with weight loss in the past  6.PCM on TNA   PLan she has had one day of clears, 10 days of invanz, and started on PO flagl and Cipo yesterday.   Continue clears for now, I will  find out when he wants to advance to fulls, add stool softner, continue Miralax, recheck labs in AM     LOS: 11 days    Bryden Darden 10/04/2012

## 2012-10-04 NOTE — Progress Notes (Signed)
NUTRITION FOLLOW UP  Intervention:   - Diet advancement per MD - Will continue to monitor   Nutrition Dx:   Inadequate oral intake related to inability to eat as evidenced by NPO - ongoing but now related to clear liquid diet as evidenced by diet order  Goal: TPN to meet >90% of estimated nutritional needs - not met, TPN d/c  New goal: Advance diet as tolerated to regular diet   Monitor:   Weights, labs, diet advancement  Assessment:   TPN d/c yesterday. Pt doing well on clear liquid diet, requesting diet advancement. Only c/o is bloating at the top of her stomach.    Height: Ht Readings from Last 1 Encounters:  09/24/12 5\' 9"  (1.753 m)    Weight Status:   Wt Readings from Last 1 Encounters:  09/23/12 328 lb 0.7 oz (148.8 kg)    Re-estimated needs:  Kcal: 1600-1700 Protein: 80-90g Fluid: 1.6-1.7L/day  Skin: Non-pitting RLE, LLE edema  Diet Order: Clear Liquid   Intake/Output Summary (Last 24 hours) at 10/04/12 1246 Last data filed at 10/04/12 0647  Gross per 24 hour  Intake   1597 ml  Output      0 ml  Net   1597 ml    Last BM: 9/26   Labs:   Recent Labs Lab 09/29/12 0615 09/30/12 0545 10/01/12 0405 10/03/12 0500  NA 136 138 138 137  K 3.9 4.1 3.5 3.7  CL 101 100 99 99  CO2 29 29 29 26   BUN 9 12 14 17   CREATININE 0.87 0.87 0.85 0.82  CALCIUM 9.3 9.3 9.3 9.3  MG 2.1 2.5  --  2.1  PHOS 4.5 4.4  --  4.6  GLUCOSE 111* 101* 108* 110*    CBG (last 3)   Recent Labs  10/03/12 1653 10/03/12 2033 10/04/12 0739  GLUCAP 130* 86 99    Scheduled Meds: . buPROPion  300 mg Oral Daily  . ciprofloxacin  500 mg Oral BID  . citalopram  40 mg Oral Daily  . docusate sodium  200 mg Oral BID  . enoxaparin (LOVENOX) injection  40 mg Subcutaneous Q24H  . metroNIDAZOLE  500 mg Oral Q8H  . pantoprazole (PROTONIX) IV  40 mg Intravenous QHS  . polyethylene glycol  17 g Oral BID  . sodium chloride  10-40 mL Intracatheter Q12H    Continuous  Infusions: . dextrose 5% lactated ringers with KCl 20 mEq/L 20 mL/hr at 10/02/12 2029     Mikey College [place], [place], [place] Pager [phone] After Hours Pager

## 2012-10-05 LAB — BASIC METABOLIC PANEL
BUN: 10 mg/dL (ref 6–23)
CO2: 24 mEq/L (ref 19–32)
Calcium: 9 mg/dL (ref 8.4–10.5)
Creatinine, Ser: 0.83 mg/dL (ref 0.50–1.10)
GFR calc Af Amer: >90 mL/min (ref >90)
GFR calc non Af Amer: 80 mL/min — ABNORMAL LOW (ref >90)
Sodium: 136 mEq/L (ref 135–145)

## 2012-10-05 LAB — CBC
Hemoglobin: 10.6 g/dL — ABNORMAL LOW (ref 12.0–15.0)
MCH: 24.8 pg — ABNORMAL LOW (ref 26.0–34.0)
MCV: 79.6 fL (ref 78.0–100.0)
Platelets: 281 10*3/uL (ref 150–400)
RBC: 4.27 MIL/uL (ref 3.87–5.11)
RDW: 14.1 % (ref 11.5–15.5)
WBC: 8.3 10*3/uL (ref 4.0–10.5)

## 2012-10-05 MED ORDER — CIPROFLOXACIN HCL 500 MG PO TABS: 500.0000 mg | ORAL_TABLET | Freq: Two times a day (BID) | ORAL | Status: DC

## 2012-10-05 MED ORDER — METRONIDAZOLE 500 MG PO TABS: 500.0000 mg | ORAL_TABLET | Freq: Three times a day (TID) | ORAL | Status: DC

## 2012-10-05 NOTE — Discharge Summary (Signed)
Physician Discharge Summary  Patient ID: LACI PFENDER MRN: [id number] DOB/AGE: [birth year] 52 y.o.  Admit date: 09/23/2012 Discharge date: 10/05/2012  Admission Diagnoses:  Abdominal pain; stercoral perforation of the sigmoid colon  Discharge Diagnoses:  Same; improved without surgery  Principal Problem:   Perforation of sigmoid colon - stercoral Active Problems:   Obesity (BMI 30-39.9)   Anxiety associated with depression   Hx of tobacco use, presenting hazards to health   H/O gastroesophageal reflux (GERD)   At high risk for falls   Constipation, chronic   Surgery:  none  Discharged Condition: improved   Hospital Course:   Admitted and treated with IV antibiotics and TNA.  Pain improved.  Repeat CT still showed small collection but inflammation was decreased.  Clinically better.  Wanting to avoid surgery.  Begun on po Cipro and Flagyl and sent home on liquids to be followed up in the office later this week after repeat CT scan.    Consults: none  Significant Diagnostic Studies: CT scan    Discharge Exam: Blood pressure 120/68, pulse 78, temperature 97.6 F (36.4 C), temperature source Oral, resp. rate 16, height 5\' 9"  (1.753 m), weight 328 lb 0.7 oz (148.8 kg), last menstrual period 09/17/2012, SpO2 99.00%. Obese and less tender to palpation  Disposition: 01-Home or Self Care  Discharge Orders   Future Orders Complete By Expires   Call MD for:  severe uncontrolled pain  As directed    Call MD for:  temperature >100.4  As directed    Discharge instructions  As directed    Comments:     Stay on liquids by mouth-no solid food Take antibiotics as directed Followup CT to be scheduled for later this week   Increase activity slowly  As directed        Medication List    STOP taking these medications       bisacodyl 5 MG EC tablet  Commonly known as:  DULCOLAX     docusate sodium 100 MG capsule  Commonly known as:  COLACE     phentermine 37.5 MG capsule     sodium phosphate enema  Commonly known as:  FLEET      TAKE these medications       ALPRAZolam 1 MG tablet  Commonly known as:  XANAX  Take 1 mg by mouth 3 (three) times daily as needed for anxiety.     buPROPion 300 MG 24 hr tablet  Commonly known as:  WELLBUTRIN XL  Take 300 mg by mouth daily.     ciprofloxacin 500 MG tablet  Commonly known as:  CIPRO  Take 1 tablet (500 mg total) by mouth 2 (two) times daily.     citalopram 20 MG tablet  Commonly known as:  CELEXA  Take 40 mg by mouth daily.     metroNIDAZOLE 500 MG tablet  Commonly known as:  FLAGYL  Take 1 tablet (500 mg total) by mouth every 8 (eight) hours.     ORTHO-CYCLEN (28) 0.25-35 MG-MCG tablet  Generic drug:  norgestimate-ethinyl estradiol  Take 1 tablet by mouth daily.     polyethylene glycol packet  Commonly known as:  MIRALAX / GLYCOLAX  Take 17 g by mouth 2 (two) times daily as needed (constipation).           Follow-up Information   Follow up with [facility], MD In 1 week. (call Jeralyn Ruths to confirm appointment on Monday afternoon)    Specialty:  General Surgery  Contact information:   [address] [place] [address] [phone]       Signed: Pedro Earls 10/05/2012, 9:47 AM

## 2012-10-05 NOTE — Progress Notes (Signed)
Patient discharge home discharge instruction given understanding verbalized no additional questions at this time.

## 2012-10-07 ENCOUNTER — Telehealth (INDEPENDENT_AMBULATORY_CARE_PROVIDER_SITE_OTHER): Payer: Self-pay | Admitting: *Deleted

## 2012-10-07 ENCOUNTER — Other Ambulatory Visit (INDEPENDENT_AMBULATORY_CARE_PROVIDER_SITE_OTHER): Payer: Self-pay | Admitting: Surgery

## 2012-10-07 DIAGNOSIS — K529 Noninfective gastroenteritis and colitis, unspecified: Secondary | ICD-10-CM

## 2012-10-07 NOTE — Telephone Encounter (Signed)
I called pt to inform her of her appt at GI located at [address] on 10/09/12 and to arrive at 3:15.  Pt instructed to pick up contrast by tomorrow (9/29) evening.

## 2012-10-07 NOTE — Telephone Encounter (Signed)
LMOM for pt to return my call.  I was calling to inform her of her CT scan appt at GI at [address] on 10/08/12 with an arrival time of 1:45.  Pt also needs to pick up her contrast this evening.

## 2012-10-08 ENCOUNTER — Other Ambulatory Visit: Payer: 59

## 2012-10-08 ENCOUNTER — Encounter (INDEPENDENT_AMBULATORY_CARE_PROVIDER_SITE_OTHER): Payer: Self-pay

## 2012-10-09 ENCOUNTER — Ambulatory Visit
Admission: RE | Admit: 2012-10-09 | Discharge: 2012-10-09 | Disposition: A | Discharge status: Home / Self Care | Payer: 59 | Source: Ambulatory Visit | Attending: Surgery | Admitting: Surgery

## 2012-10-09 DIAGNOSIS — K529 Noninfective gastroenteritis and colitis, unspecified: Secondary | ICD-10-CM

## 2012-10-09 MED ORDER — IOHEXOL 300 MG/ML  SOLN
150.0000 mL | Freq: Once | INTRAMUSCULAR | Status: AC | PRN
  Administered 2012-10-09: 150 mL via INTRAVENOUS

## 2012-10-10 ENCOUNTER — Encounter (INDEPENDENT_AMBULATORY_CARE_PROVIDER_SITE_OTHER): Payer: Self-pay | Admitting: Surgery

## 2012-10-10 ENCOUNTER — Ambulatory Visit (INDEPENDENT_AMBULATORY_CARE_PROVIDER_SITE_OTHER): Payer: 59 | Admitting: Surgery

## 2012-10-10 VITALS — BP 128/76 | HR 72 | Temp 98.2°F | Resp 14 | Ht 69.0 in | Wt 311.0 lb

## 2012-10-10 DIAGNOSIS — K529 Noninfective gastroenteritis and colitis, unspecified: Secondary | ICD-10-CM

## 2012-10-10 DIAGNOSIS — K5289 Other specified noninfective gastroenteritis and colitis: Secondary | ICD-10-CM

## 2012-10-10 NOTE — Patient Instructions (Addendum)
Low-Fiber Diet Fiber is found in fruits, vegetables, and grains. A low-fiber diet restricts fibrous foods that are not digested in the small intestine. A diet containing about 10 grams of fiber is considered low fiber.  PURPOSE  To prevent blockage of a partially obstructed or narrowed gastrointestinal tract.  To reduce fecal weight and volume.  To slow the movement of feces. WHEN IS THIS DIET USED?  It may be used during the acute phase of Crohn disease, ulcerative colitis, regional enteritis, or diverticulitis.  It may be used if your intestinal or esophageal tubes are narrowing (stenosis).  It may be used as a transitional diet following surgery, injury (trauma), or illness. CHOOSING FOODS Check labels, especially on foods from the starch list. Often times, dietary fiber content is listed on the nutrition facts panel. Please ask your Registered Dietitian if you have questions about specific foods that are related to your condition, especially if the food is not listed on this handout. Breads and Starches  Allowed: White, French, and pita breads, plain rolls, buns, or sweet rolls, doughnuts, waffles, pancakes, bagels. Plain muffins, biscuits, matzoth. Soda, saltine, graham crackers. Pretzels, rusks, melba toast, zwieback. Cooked cereals: cornmeal, farina, or cream cereals. Dry cereals: refined corn, wheat, rice, and oat cereals (check label). Potatoes prepared any way without skins, refined macaroni, spaghetti, noodles, refined rice.  Avoid: Whole-wheat bread, rolls, and crackers. Multigrains, rye, bran seeds, nuts, or coconut. Cereals containing whole grains, multigrains, bran, coconut, nuts, raisins. Cooked or dry oatmeal. Coarse wheat cereals, granola. Cereals advertised as "high fiber." Potato skins. Whole-grain pasta, wild or brown rice. Popcorn. Vegetables  Allowed: Strained tomato and vegetable juices. Fresh lettuce, cucumber, spinach. Well-cooked or canned: asparagus, bean sprouts,  broccoli, cut green beans, cauliflower, pumpkin, beets, mushrooms, olives, yellow squash, tomato, tomato sauce, zucchini, turnips.Keep servings limited to  cup.  Avoid: Fresh, cooked, or canned: artichokes, baked beans, beet greens, Brussels sprouts, corn, kale, legumes, peas, sweet potatoes. Avoid large servings of any vegetables. Fruit  Allowed: All fruit juices except prune juice. Cooked or canned fruits without skin and seeds: apricots, applesauce, cantaloupe, cherries, grapefruit, grapes, kiwi, mandarin oranges, peaches, pears, fruit cocktail, pineapple, plums, watermelon. Fresh without skin: banana, grapes, cantaloupe, avocado, cherries, pineapple, kiwi, nectarines, peaches, blueberries. Keep servings limited to  cup or 1 piece.  Avoid: Fresh: apples with or without skin, apricots, mangoes, pears, raspberries, strawberries. Prune juice and juices with pulp, stewed or dried prunes. Dried fruits, raisins, dates. Avoid large servings of all fresh fruits. Meat and Protein Substitutes  Allowed: Ground or well-cooked tender beef, ham, veal, lamb, pork, poultry. Eggs, plain cheese. Fish, oysters, shrimp, lobster, other seafood. Liver, organ meats. Smooth nut butters.  Avoid: Tough, fibrous meats with gristle. Chunky nut butter.Cheese with seeds, nuts, or other foods not allowed. Nuts, seeds, legumes, dried peas, beans, lentils. Dairy  Allowed: All milk products except those not allowed.  Avoid: Yogurt or cheese that contains nuts, seeds, or added fruit. Soups and Combination Foods  Allowed: Bouillon, broth, or cream soups made from allowed foods. Any strained soup. Casseroles or mixed dishes made with allowed foods.  Avoid: Soups made from vegetables that are not allowed or that contain other foods not allowed. Desserts and Sweets  Allowed:Plain cakes and cookies, pie made with allowed fruit, pudding, custard, cream pie. Gelatin, fruit, ice, sherbet, frozen ice pops. Ice cream, ice milk  without nuts. Plain hard candy, honey, jelly, molasses, syrup, sugar, chocolate syrup, gumdrops, marshmallows.  Avoid: Desserts, cookies, or candies that   contain nuts, peanut butter, dried fruits. Jams, preserves with seeds, marmalade. Fats and Oils  Allowed:Margarine, butter, cream, mayonnaise, salad oils, plain salad dressings made from allowed foods.  Avoid: Seeds, nuts, olives. Beverages  Allowed: All, except those listed to avoid.  Avoid: Fruit juices with high pulp, prune juice. Condiments  Allowed:Ketchup, mustard, horseradish, vinegar, cream sauce, cheese sauce, cocoa powder. Spices in moderation: allspice, basil, bay leaves, celery powder or leaves, cinnamon, cumin powder, curry powder, ginger, mace, marjoram, onion or garlic powder, oregano, paprika, parsley flakes, ground pepper, rosemary, sage, savory, tarragon, thyme, turmeric.  Avoid: Coconut, pickles. SAMPLE MENU Breakfast   cup orange juice.  1 boiled egg.  1 slice white toast.  Margarine.   cup cornflakes.  1 cup milk.  Beverage. Lunch   cup chicken noodle soup.  2 to 3 oz sliced roast beef.  2 slices white bread.  Mayonnaise.   cup tomato juice.  1 small banana.  Beverage. Dinner  3 oz baked chicken.   cup scalloped potatoes.   cup cooked beets.  White dinner roll.  Margarine.   cup canned peaches.  Beverage. Document Released: 06/17/2001 Document Revised: 03/20/2011 Document Reviewed: 01/12/2011 [facility] Patient Information 2014 [place], [place].   Continue another round of the antibiotics (use refill) May return to work tomorrow

## 2012-10-10 NOTE — Progress Notes (Signed)
followup evaluation Mrs. Devaul who was recently [facility] with what appeared to be a perforated sigmoid colon. She has been sick beginning in early September and was hospitalized and placed on antibiotics. Since then she lost about 20 pounds. She was to return to work if [place]. Think it's okay. She'll stay on another round of antibiotics. She is currently not having any pain he clinically is markedly improved. I reviewed her CT scan from yesterday and it is stable. Time now not certain what the collection adjacent sigmoid is a lovely suggested he could have an ovarian component.  We'll continue on a second round of the same antibiotics he'll see her back in 3 weeks. We'll begin a low residue diet.

## 2012-10-25 ENCOUNTER — Encounter (INDEPENDENT_AMBULATORY_CARE_PROVIDER_SITE_OTHER): Payer: 59 | Admitting: Surgery

## 2012-11-20 ENCOUNTER — Encounter (INDEPENDENT_AMBULATORY_CARE_PROVIDER_SITE_OTHER): Payer: 59 | Admitting: Surgery

## 2012-12-03 ENCOUNTER — Encounter (INDEPENDENT_AMBULATORY_CARE_PROVIDER_SITE_OTHER): Payer: 59 | Admitting: Surgery

## 2012-12-04 ENCOUNTER — Encounter (INDEPENDENT_AMBULATORY_CARE_PROVIDER_SITE_OTHER): Payer: Self-pay | Admitting: Surgery

## 2012-12-18 ENCOUNTER — Telehealth (INDEPENDENT_AMBULATORY_CARE_PROVIDER_SITE_OTHER): Payer: Self-pay | Admitting: *Deleted

## 2012-12-18 ENCOUNTER — Other Ambulatory Visit (INDEPENDENT_AMBULATORY_CARE_PROVIDER_SITE_OTHER): Payer: Self-pay | Admitting: *Deleted

## 2012-12-18 MED ORDER — METRONIDAZOLE 500 MG PO TABS: 500.0000 mg | ORAL_TABLET | Freq: Three times a day (TID) | ORAL | Status: AC

## 2012-12-18 MED ORDER — CIPROFLOXACIN HCL 500 MG PO TABS: 500.0000 mg | ORAL_TABLET | Freq: Two times a day (BID) | ORAL | Status: AC

## 2012-12-18 NOTE — Telephone Encounter (Signed)
Received request from [facility] for Flagyl as well.  Dr. Hassell Done reviewed this prescription as well and approved refill as prescribed previously.  Prescription escribed at this time.

## 2012-12-18 NOTE — Telephone Encounter (Signed)
Received a request from [facility] pharmacy asking for refill of Cipro.  Dr. Hassell Done reviewed in office and approved refill of Cipro as he previously ordered.  Escribed prescription at this time.

## 2013-01-09 DIAGNOSIS — M317 Microscopic polyangiitis: Secondary | ICD-10-CM

## 2013-01-09 HISTORY — DX: Microscopic polyangiitis: M31.7

## 2013-01-17 ENCOUNTER — Ambulatory Visit (INDEPENDENT_AMBULATORY_CARE_PROVIDER_SITE_OTHER): Payer: 59 | Admitting: Surgery

## 2013-01-17 ENCOUNTER — Encounter (INDEPENDENT_AMBULATORY_CARE_PROVIDER_SITE_OTHER): Payer: Self-pay | Admitting: Surgery

## 2013-01-17 VITALS — BP 126/72 | HR 80 | Temp 98.0°F | Resp 18 | Ht 69.0 in | Wt 308.0 lb

## 2013-01-17 DIAGNOSIS — K631 Perforation of intestine (nontraumatic): Secondary | ICD-10-CM

## 2013-01-17 NOTE — Progress Notes (Signed)
[name] 53 y.o.  Body mass index is 45.46 kg/(m^2).  Patient Active Problem List   Diagnosis Date Noted  . Perforation of sigmoid colon - stercoral 09/27/2012  . Constipation, chronic 09/27/2012  . Obesity (BMI 30-39.9) 09/23/2012  . Anxiety associated with depression 09/23/2012  . Hx of tobacco use, presenting hazards to health 09/23/2012  . H/O gastroesophageal reflux (GERD) 09/23/2012  . At high risk for falls 09/23/2012    Allergies  Allergen Reactions  . Sulfa Antibiotics     Past Surgical History  Procedure Laterality Date  . Appendectomy    . Tonsillectomy    . Bladder repair     [name], MD No diagnosis found.  Doing well after eventual healing of her stercoral perforation.  She has some intermittent llq pain but this is not sustained.  She is now have daily BMs.  Before her perforation she was having a BM every 3-4 days.    Continue Miralax daily Advance to high fiber diet.   Matt B. Hassell Done, MD, [facility] [phone] beeper [phone]  01/17/2013 2:45 PM

## 2013-01-17 NOTE — Patient Instructions (Signed)

## 2013-06-23 ENCOUNTER — Emergency Department (HOSPITAL_COMMUNITY): Payer: 59

## 2013-06-23 ENCOUNTER — Encounter (HOSPITAL_COMMUNITY): Payer: Self-pay | Admitting: Emergency Medicine

## 2013-06-23 ENCOUNTER — Inpatient Hospital Stay (HOSPITAL_COMMUNITY)
Admission: EM | Admit: 2013-06-23 | Discharge: 2013-07-22 | DRG: 204 | Disposition: A | Discharge status: Skilled Nursing Facility | Payer: 59 | Attending: Internal Medicine | Admitting: Internal Medicine

## 2013-06-23 DIAGNOSIS — K631 Perforation of intestine (nontraumatic): Secondary | ICD-10-CM

## 2013-06-23 DIAGNOSIS — R059 Cough, unspecified: Secondary | ICD-10-CM

## 2013-06-23 DIAGNOSIS — J189 Pneumonia, unspecified organism: Secondary | ICD-10-CM

## 2013-06-23 DIAGNOSIS — K219 Gastro-esophageal reflux disease without esophagitis: Secondary | ICD-10-CM | POA: Diagnosis present

## 2013-06-23 DIAGNOSIS — K5909 Other constipation: Secondary | ICD-10-CM

## 2013-06-23 DIAGNOSIS — D72829 Elevated white blood cell count, unspecified: Secondary | ICD-10-CM | POA: Diagnosis present

## 2013-06-23 DIAGNOSIS — J9691 Respiratory failure, unspecified with hypoxia: Secondary | ICD-10-CM | POA: Diagnosis present

## 2013-06-23 DIAGNOSIS — R0489 Hemorrhage from other sites in respiratory passages: Secondary | ICD-10-CM

## 2013-06-23 DIAGNOSIS — D508 Other iron deficiency anemias: Secondary | ICD-10-CM

## 2013-06-23 DIAGNOSIS — D500 Iron deficiency anemia secondary to blood loss (chronic): Secondary | ICD-10-CM

## 2013-06-23 DIAGNOSIS — Z9181 History of falling: Secondary | ICD-10-CM

## 2013-06-23 DIAGNOSIS — R7309 Other abnormal glucose: Secondary | ICD-10-CM | POA: Diagnosis present

## 2013-06-23 DIAGNOSIS — D62 Acute posthemorrhagic anemia: Secondary | ICD-10-CM | POA: Diagnosis present

## 2013-06-23 DIAGNOSIS — M300 Polyarteritis nodosa: Secondary | ICD-10-CM | POA: Diagnosis present

## 2013-06-23 DIAGNOSIS — J9600 Acute respiratory failure, unspecified whether with hypoxia or hypercapnia: Secondary | ICD-10-CM | POA: Diagnosis present

## 2013-06-23 DIAGNOSIS — Z881 Allergy status to other antibiotic agents status: Secondary | ICD-10-CM

## 2013-06-23 DIAGNOSIS — R042 Hemoptysis: Principal | ICD-10-CM | POA: Diagnosis present

## 2013-06-23 DIAGNOSIS — R918 Other nonspecific abnormal finding of lung field: Secondary | ICD-10-CM | POA: Diagnosis present

## 2013-06-23 DIAGNOSIS — D509 Iron deficiency anemia, unspecified: Secondary | ICD-10-CM

## 2013-06-23 DIAGNOSIS — K5900 Constipation, unspecified: Secondary | ICD-10-CM | POA: Diagnosis present

## 2013-06-23 DIAGNOSIS — I10 Essential (primary) hypertension: Secondary | ICD-10-CM | POA: Diagnosis present

## 2013-06-23 DIAGNOSIS — R319 Hematuria, unspecified: Secondary | ICD-10-CM | POA: Diagnosis present

## 2013-06-23 DIAGNOSIS — I7782 Antineutrophilic cytoplasmic antibody (ANCA) vasculitis: Secondary | ICD-10-CM

## 2013-06-23 DIAGNOSIS — R05 Cough: Secondary | ICD-10-CM

## 2013-06-23 DIAGNOSIS — E876 Hypokalemia: Secondary | ICD-10-CM | POA: Diagnosis not present

## 2013-06-23 DIAGNOSIS — J9601 Acute respiratory failure with hypoxia: Secondary | ICD-10-CM

## 2013-06-23 DIAGNOSIS — Z8719 Personal history of other diseases of the digestive system: Secondary | ICD-10-CM

## 2013-06-23 DIAGNOSIS — D649 Anemia, unspecified: Secondary | ICD-10-CM

## 2013-06-23 DIAGNOSIS — T380X5A Adverse effect of glucocorticoids and synthetic analogues, initial encounter: Secondary | ICD-10-CM | POA: Diagnosis present

## 2013-06-23 DIAGNOSIS — I776 Arteritis, unspecified: Secondary | ICD-10-CM

## 2013-06-23 DIAGNOSIS — R32 Unspecified urinary incontinence: Secondary | ICD-10-CM | POA: Diagnosis not present

## 2013-06-23 DIAGNOSIS — Z6841 Body Mass Index (BMI) 40.0 and over, adult: Secondary | ICD-10-CM

## 2013-06-23 DIAGNOSIS — R0902 Hypoxemia: Secondary | ICD-10-CM

## 2013-06-23 DIAGNOSIS — F341 Dysthymic disorder: Secondary | ICD-10-CM | POA: Diagnosis present

## 2013-06-23 DIAGNOSIS — F418 Other specified anxiety disorders: Secondary | ICD-10-CM | POA: Diagnosis present

## 2013-06-23 DIAGNOSIS — E669 Obesity, unspecified: Secondary | ICD-10-CM | POA: Diagnosis present

## 2013-06-23 DIAGNOSIS — Z87891 Personal history of nicotine dependence: Secondary | ICD-10-CM

## 2013-06-23 HISTORY — DX: Respiratory failure, unspecified with hypoxia: J96.91

## 2013-06-23 HISTORY — DX: Hemoptysis: R04.2

## 2013-06-23 HISTORY — DX: Hemorrhage from other sites in respiratory passages: R04.89

## 2013-06-23 HISTORY — DX: Acute posthemorrhagic anemia: D62

## 2013-06-23 LAB — CBC WITH DIFFERENTIAL/PLATELET
BASOS PCT: 0 % (ref 0–1)
Basophils Absolute: 0 10*3/uL (ref 0.0–0.1)
EOS ABS: 0.1 10*3/uL (ref 0.0–0.7)
Eosinophils Relative: 1 % (ref 0–5)
HCT: 26.1 % — ABNORMAL LOW (ref 36.0–46.0)
Hemoglobin: 8.1 g/dL — ABNORMAL LOW (ref 12.0–15.0)
LYMPHS ABS: 1 10*3/uL (ref 0.7–4.0)
Lymphocytes Relative: 6 % — ABNORMAL LOW (ref 12–46)
MCH: 24 pg — ABNORMAL LOW (ref 26.0–34.0)
MCHC: 31 g/dL (ref 30.0–36.0)
MCV: 77.4 fL — ABNORMAL LOW (ref 78.0–100.0)
Monocytes Absolute: 0.7 10*3/uL (ref 0.1–1.0)
Monocytes Relative: 4 % (ref 3–12)
NEUTROS PCT: 89 % — AB (ref 43–77)
Neutro Abs: 14.2 10*3/uL — ABNORMAL HIGH (ref 1.7–7.7)
PLATELETS: 344 10*3/uL (ref 150–400)
RBC: 3.37 MIL/uL — AB (ref 3.87–5.11)
RDW: 13.8 % (ref 11.5–15.5)
WBC: 16 10*3/uL — ABNORMAL HIGH (ref 4.0–10.5)

## 2013-06-23 LAB — BASIC METABOLIC PANEL
BUN: 16 mg/dL (ref 6–23)
CO2: 26 mEq/L (ref 19–32)
Calcium: 9.2 mg/dL (ref 8.4–10.5)
Chloride: 101 mEq/L (ref 96–112)
Creatinine, Ser: 0.8 mg/dL (ref 0.50–1.10)
GFR calc Af Amer: >90 mL/min (ref >90)
GFR, EST NON AFRICAN AMERICAN: 83 mL/min — AB (ref >90)
Glucose, Bld: 127 mg/dL — ABNORMAL HIGH (ref 70–99)
POTASSIUM: 4 meq/L (ref 3.7–5.3)
SODIUM: 140 meq/L (ref 137–147)

## 2013-06-23 LAB — I-STAT CG4 LACTIC ACID, ED: Lactic Acid, Venous: 1.21 mmol/L (ref 0.5–2.2)

## 2013-06-23 LAB — BLOOD GAS, ARTERIAL
Acid-Base Excess: 1.3 mmol/L (ref 0.0–2.0)
BICARBONATE: 25.1 meq/L — AB (ref 20.0–24.0)
Drawn by: 232811
O2 Content: 4 L/min
O2 SAT: 91.2 %
Patient temperature: 98.4
TCO2: 23.5 mmol/L (ref 0–100)
pCO2 arterial: 38.6 mmHg (ref 35.0–45.0)
pH, Arterial: 7.429 (ref 7.350–7.450)
pO2, Arterial: 62.4 mmHg — ABNORMAL LOW (ref 80.0–100.0)

## 2013-06-23 LAB — PRO B NATRIURETIC PEPTIDE: PRO B NATRI PEPTIDE: 582.7 pg/mL — AB (ref 0–125)

## 2013-06-23 LAB — MRSA PCR SCREENING: MRSA BY PCR: NEGATIVE

## 2013-06-23 MED ORDER — ALPRAZOLAM 1 MG PO TABS
1.0000 mg | ORAL_TABLET | Freq: Three times a day (TID) | ORAL | Status: DC | PRN
  Administered 2013-06-23 – 2013-07-07 (×33): 1 mg via ORAL
  Filled 2013-06-23 (×14): qty 1
  Filled 2013-06-23: qty 2
  Filled 2013-06-23 (×18): qty 1

## 2013-06-23 MED ORDER — CITALOPRAM HYDROBROMIDE 40 MG PO TABS
40.0000 mg | ORAL_TABLET | Freq: Every day | ORAL | Status: DC
  Administered 2013-06-24 – 2013-07-22 (×29): 40 mg via ORAL
  Filled 2013-06-23 (×4): qty 1
  Filled 2013-06-23: qty 2
  Filled 2013-06-23 (×2): qty 1
  Filled 2013-06-23 (×7): qty 2
  Filled 2013-06-23: qty 1
  Filled 2013-06-23 (×2): qty 2
  Filled 2013-06-23: qty 1
  Filled 2013-06-23: qty 2
  Filled 2013-06-23: qty 1
  Filled 2013-06-23: qty 2
  Filled 2013-06-23 (×3): qty 1
  Filled 2013-06-23 (×2): qty 2
  Filled 2013-06-23 (×3): qty 1
  Filled 2013-06-23 (×2): qty 2

## 2013-06-23 MED ORDER — IPRATROPIUM-ALBUTEROL 0.5-2.5 (3) MG/3ML IN SOLN
3.0000 mL | Freq: Once | RESPIRATORY_TRACT | Status: AC
  Administered 2013-06-23: 3 mL via RESPIRATORY_TRACT
  Filled 2013-06-23: qty 3

## 2013-06-23 MED ORDER — IOHEXOL 350 MG/ML SOLN
100.0000 mL | Freq: Once | INTRAVENOUS | Status: AC | PRN
  Administered 2013-06-23: 100 mL via INTRAVENOUS

## 2013-06-23 MED ORDER — LEVOFLOXACIN IN D5W 750 MG/150ML IV SOLN
750.0000 mg | INTRAVENOUS | Status: DC
  Administered 2013-06-24: 750 mg via INTRAVENOUS
  Filled 2013-06-23 (×3): qty 150

## 2013-06-23 MED ORDER — PHENTERMINE HCL 37.5 MG PO CAPS: 37.5000 mg | ORAL_CAPSULE | Freq: Every morning | ORAL | Status: DC

## 2013-06-23 MED ORDER — POLYETHYLENE GLYCOL 3350 17 G PO PACK
17.0000 g | PACK | Freq: Two times a day (BID) | ORAL | Status: DC | PRN
  Administered 2013-06-24 – 2013-07-15 (×22): 17 g via ORAL
  Filled 2013-06-23 (×23): qty 1

## 2013-06-23 MED ORDER — CEFTRIAXONE SODIUM 1 G IJ SOLR
1.0000 g | Freq: Once | INTRAMUSCULAR | Status: AC
  Administered 2013-06-23: 1 g via INTRAVENOUS
  Filled 2013-06-23: qty 10

## 2013-06-23 MED ORDER — SODIUM CHLORIDE 0.9 % IV BOLUS (SEPSIS)
1000.0000 mL | Freq: Once | INTRAVENOUS | Status: AC
  Administered 2013-06-23: 1000 mL via INTRAVENOUS

## 2013-06-23 MED ORDER — BUPROPION HCL ER (XL) 300 MG PO TB24
300.0000 mg | ORAL_TABLET | Freq: Every day | ORAL | Status: DC
  Administered 2013-06-24 – 2013-07-22 (×29): 300 mg via ORAL
  Filled 2013-06-23 (×29): qty 1

## 2013-06-23 MED ORDER — DEXTROSE 5 % IV SOLN
500.0000 mg | Freq: Once | INTRAVENOUS | Status: AC
  Administered 2013-06-23: 500 mg via INTRAVENOUS
  Filled 2013-06-23: qty 500

## 2013-06-23 MED ORDER — HYDROCODONE-ACETAMINOPHEN 5-325 MG PO TABS
1.0000 | ORAL_TABLET | ORAL | Status: DC | PRN
  Administered 2013-06-26 – 2013-07-22 (×69): 1 via ORAL
  Filled 2013-06-23 (×69): qty 1

## 2013-06-23 MED ORDER — FENTANYL CITRATE 0.05 MG/ML IJ SOLN
12.5000 ug | INTRAMUSCULAR | Status: DC | PRN
  Administered 2013-06-23 – 2013-07-14 (×78): 12.5 ug via INTRAVENOUS
  Filled 2013-06-23 (×75): qty 2

## 2013-06-23 NOTE — Consult Note (Signed)
PULMONARY  / CRITICAL CARE MEDICINE  Name: [name] MRN: [id number] DOB: 1960-06-05 PCP [name], MD   ADMISSION DATE:  06/23/2013 LOS 0 days  CONSULTATION DATE:  06/23/2013   REFERRING MD :  Dr Damien Fusi of [facility] PRIMARY SERVICE: [facility]  CHIEF COMPLAINT:  hemoptysis  BRIEF PATIENT DESCRIPTION: Diffuse alveolar hemorrhage  LINES / TUBES: none  CULTURES: Resp virus panel 06/23/2013 Urine leg, Urine strep 06/23/2013 PCT Lactate HIV .Marland KitchenMarland Kitchen ANA, ANCA screen, MPO/PR-3 ab, GBM, ANA, DNA, RF, CCP  ANTIBIOTICS: Anti-infectives   Start     Dose/Rate Route Frequency Ordered Stop   06/23/13 2100  levofloxacin (LEVAQUIN) IVPB 750 mg     750 mg 100 mL/hr over 90 Minutes Intravenous Every 24 hours 06/23/13 2040     06/23/13 1815  cefTRIAXone (ROCEPHIN) 1 g in dextrose 5 % 50 mL IVPB     1 g 100 mL/hr over 30 Minutes Intravenous  Once 06/23/13 1809 06/23/13 2116   06/23/13 1815  azithromycin (ZITHROMAX) 500 mg in dextrose 5 % 250 mL IVPB     500 mg 250 mL/hr over 60 Minutes Intravenous  Once 06/23/13 1809 06/23/13 2038        SIGNIFICANT EVENTS / STUDIES:  06/23/2013    HISTORY OF PRESENT ILLNESS:    53 year old obese female with ltd past history and normal CT abd lung cut Oct 2014 reports abrupt onset of dry cough 5-7 days prior to admission and then 24-48h of bright, small amount of regular hemoptysis since 06/22/13. Upon admission noted to have all findings c/w Diffuse alveolar hemorrhage (DAH) - anemia, GGO with peripheral sparing on CT, hypoxemia, hemoptysis. Denies being ill, flu, meds that cause lupus, prior autoimmune or collagen vascuara disease 0r anti-coagulants  PAST MEDICAL HISTORY :  Past Medical History  Diagnosis Date  . Colitis   . Obesity (BMI 30-39.9) 09/23/2012  . Anxiety associated with depression 09/23/2012  . Hx of tobacco use, presenting hazards to health 09/23/2012  . H/O gastroesophageal reflux (GERD) 09/23/2012  . At high risk for  falls 09/23/2012    She has had a fractured ankle and tendon tear with falls over the last couple years.  . Diverticulitis of large intestine with perforation 09/23/2012  . GERD (gastroesophageal reflux disease)      Family History  Problem Relation Age of Onset  . Cancer Paternal Grandmother     breast     History   Social History  . Marital Status: Married    Spouse Name: N/A    Number of Children: N/A  . Years of Education: N/A   Occupational History  . Not on file.   Social History Main Topics  . Smoking status: Former Smoker    Quit date: 01/09/2001  . Smokeless tobacco: Never Used  . Alcohol Use: No  . Drug Use: No  . Sexual Activity: Not on file   Other Topics Concern  . Not on file   Social History Narrative  . No narrative on file     Allergies  Allergen Reactions  . Sulfa Antibiotics       (Not in an outpatient encounter)     REVIEW OF SYSTEMS:  Dyspnea, cough, hpoxemia  - positive. Rest 11 point ROS is negative. All listed in HPI  SUBJECTIVE:   VITAL SIGNS: Filed Vitals:   06/23/13 2200 06/23/13 2215 06/23/13 2230 06/23/13 2245  BP: 138/55 127/63 135/57 126/68  Pulse: 101 98 97 99  Temp:  TempSrc:      Resp: 20 24 33 27  Height:      Weight:      SpO2: 92% 95% 95% 95%      HEMODYNAMICS:   VENTILATOR SETTINGS: Vent Mode:  [-] PRVC FiO2 (%):  [30 %] 30 % Set Rate:  [28 bmp] 28 bmp Vt Set:  [420 mL] 420 mL PEEP:  [5 cmH20] 5 cmH20 Plateau Pressure:  [4 [id number] cmH20] 20 cmH20 INTAKE / OUTPUT:       PHYSICAL EXAMINATION: General:  Obese, looks some unwell Neuro:  AXOX3. Pleasant. Moves all 4s HEENT:  Nasal cannula o2 on. Neck supple Cardiovascular:  RRR + Lungs:  R > L crackles. No distress Abdomen:  Obese, soft, non tender Musculoskeletal:  No cyanosis, no clubbing, no edema Skin:  intact  LABS: PULMONARY  Recent Labs Lab 06/23/13 2120  PHART 7.429  PCO2ART 38.6  PO2ART 62.4*  HCO3 25.1*  TCO2 23.5   O2SAT 91.2    CBC  Recent Labs Lab 06/23/13 1713  HGB 8.1*  HCT 26.1*  WBC 16.0*  PLT 344    COAGULATION No results found for this basename: INR,  in the last 168 hours  CARDIAC  No results found for this basename: TROPONINI,  in the last 168 hours  Recent Labs Lab 06/23/13 1713  PROBNP 582.7*     CHEMISTRY  Recent Labs Lab 06/23/13 1713  NA 140  K 4.0  CL 101  CO2 26  GLUCOSE 127*  BUN 16  CREATININE 0.80  CALCIUM 9.2   Estimated Creatinine Clearance: 125.6 ml/min (by C-G formula based on Cr of 0.8).   LIVER No results found for this basename: AST, ALT, ALKPHOS, BILITOT, PROT, ALBUMIN, INR,  in the last 168 hours   INFECTIOUS  Recent Labs Lab 06/23/13 1904  LATICACIDVEN 1.21     ENDOCRINE CBG (last 3)  No results found for this basename: GLUCAP,  in the last 72 hours       IMAGING x48h  Ct Angio Chest Pe W/cm &/or Wo Cm  06/23/2013   CLINICAL DATA:  Shortness of breath.  Hemoptysis.  EXAM: CT ANGIOGRAPHY CHEST WITH CONTRAST  TECHNIQUE: Multidetector CT imaging of the chest was performed using the standard protocol during bolus administration of intravenous contrast. Multiplanar CT image reconstructions and MIPs were obtained to evaluate the vascular anatomy.  CONTRAST:  124mL OMNIPAQUE IOHEXOL 350 MG/ML SOLN  COMPARISON:  Chest x-ray earlier today.  FINDINGS: No filling defects in the pulmonary arteries to suggest pulmonary emboli. Heart is normal size. Aorta is normal caliber.  Severe bilateral ground-glass airspace opacities with relative sparing of the lung bases. This presumably represents infection or hemorrhage. No pleural effusions. Scattered small mediastinal lymph nodes, none pathologically enlarged. No hilar or axillary adenopathy.  Chest wall soft tissues are unremarkable. Imaging into the upper abdomen shows no acute findings.  Review of the MIP images confirms the above findings.  IMPRESSION: No evidence of pulmonary embolus.   Severe bilateral ground-glass airspace opacities with relative sparing of the lung bases. This is likely scratch head this presumably represents diffuse infection/pneumonia or hemorrhage.   Electronically Signed   By: Rolm Baptise M.D.   On: 06/23/2013 19:05   Dg Chest Port 1 View  06/23/2013   CLINICAL DATA:  Shortness of breath, cough and hemoptysis.  EXAM: PORTABLE CHEST - 1 VIEW  COMPARISON:  09/23/2012  FINDINGS: The cardiac silhouette, mediastinal and hilar contours are within normal limits and stable.  There is a diffuse airspace process. This could be diffuse infection or inflammation. Noncardiogenic pulmonary edema is also possible but less likely. No pleural effusion. The bony thorax is intact.  IMPRESSION: Diffuse and fairly fulminant bilateral airspace process unlikely noncardiogenic pulmonary edema and more likely infection or severe inflammation.   Electronically Signed   By: Kalman Jewels M.D.   On: 06/23/2013 17:28       ASSESSMENT / PLAN:  PULMONARY A:Acute Hypoxemic Respiratory Failure due to Diffuse Alveolar Hemorrhage. Currently needing 4L [place] P:   Continue o2 for pulse ox goal > 88% Check autoimmune panel - see face sheet Do ID workup: check urine leg, urine strep and resp virus panel Hold off steroids till autoimmune panel back ((unless she worsens fast) Might need surgical lung bx if autpimmune negative v empiric Rx bipap if worse -> intubate if even worse   CARDIOVASCULAR A: No evidence of CHF P:  Check bnp and trop  RENAL  A:  No evidence of pulmonary - renal syndrome P:   Check UA Monitor  GASTROINTESTINAL A:  Nil acute P:   Npo except meds  HEMATOLOGIC A:  Anemia associated with alveolar hge P:  PRBC for hgb < 7gm%  INFECTIOUS A:  Rule out infection P:   Check pct Check lactic Check resp virus Check urine leg and strep Check sputum culture  ENDOCRINE A:  Nil acute P:   Per [facility]  NEUROLOGIC A:  intact P:   monitor  DERM A: No  Rash P Monitor  GLOBAL She will update husband and sister 06/24/13 AM   The patient is critically ill with multiple organ systems failure and requires high complexity decision making for assessment and support, frequent evaluation and titration of therapies, application of advanced monitoring technologies and extensive interpretation of multiple databases.   Critical Care Time devoted to patient care services described in this note is  35  Minutes.   Dr. Brand Males, M.D., [facility].C.P Pulmonary and Critical Care Medicine Staff Physician [facility] Pager: [phone], If no answer or between  15:00h - 7:00h: call [phone] 06/23/2013 10:57 PM

## 2013-06-23 NOTE — ED Notes (Signed)
Patient transported to CT 

## 2013-06-23 NOTE — H&P (Addendum)
History and Physical    [name] [id number] DOB: 22-Mar-1960 DOA: 06/23/2013  Referring physician: Dr. Leonides Schanz PCP: [name], MD  Specialists: Pulmonology, Dr. Joya Gaskins  Chief Complaint: shortness of breath   HPI: [name] is a 53 y.o. female has a past medical history significant for obesity, history of sigmoid colon perforation treated conservatively followed by Dr. Hassell Done, presents to the emergency room with a chief complaint of cough for 2-3 days, and sudden onset hemoptysis 2 days ago. She states that she has been having several teaspoons of bright red blood with a cough today. She denies ever having this problem in the past. She has a smoking history, quit in 2003. She works at [employer] and has no exposures to irritants. At home, she experienced severe dyspnea and called EMS, and on arrival her O2 sats were in the 70s on room air. She was initially placed on a NRB and brought to the ED. she denies any recent fever or chills, denies any upper respiratory illnesses, she denies any diffuse muscle aches or myalgias, and has no sick contacts. She is no GI/GU symptoms. She endorses mild chest pain with inspiration which has started when she started coughing. In the ER, patient is stable breathing on 4L [place], with ongoing subjective shortness of breath and breathing 25-30 times per minute. She had a chest x-ray which showed bilateral airspace processes, and this was followed by CT scan which showed severe bilateral groundglass airspace opacities suspicious for infection/hemorrhage. [facility] asked for admission to SDU.   Review of Systems: As per history of present illness, otherwise negative  Past Medical History  Diagnosis Date  . Colitis   . Obesity (BMI 30-39.9) 09/23/2012  . Anxiety associated with depression 09/23/2012  . Hx of tobacco use, presenting hazards to health 09/23/2012  . H/O gastroesophageal reflux (GERD) 09/23/2012  . At high risk for falls 09/23/2012    She has had  a fractured ankle and tendon tear with falls over the last couple years.  . Diverticulitis of large intestine with perforation 09/23/2012  . GERD (gastroesophageal reflux disease)    Past Surgical History  Procedure Laterality Date  . Appendectomy    . Tonsillectomy    . Bladder repair     Social History:  reports that she quit smoking about 12 years ago. She has never used smokeless tobacco. She reports that she does not drink alcohol or use illicit drugs.  Allergies  Allergen Reactions  . Sulfa Antibiotics     Family History  Problem Relation Age of Onset  . Cancer Paternal Grandmother     breast   Prior to Admission medications   Medication Sig Start Date End Date Taking? Authorizing Provider  ALPRAZolam Duanne Moron) 1 MG tablet Take 1 mg by mouth 3 (three) times daily as needed for anxiety.  08/30/12  Yes Historical Provider, MD  buPROPion (WELLBUTRIN XL) 300 MG 24 hr tablet Take 300 mg by mouth daily.  08/30/12  Yes Historical Provider, MD  citalopram (CELEXA) 40 MG tablet Take 40 mg by mouth daily.   Yes Historical Provider, MD  phentermine 37.5 MG capsule Take 37.5 mg by mouth every morning.   Yes Historical Provider, MD  polyethylene glycol (MIRALAX / GLYCOLAX) packet Take 17 g by mouth 2 (two) times daily as needed (constipation).    Yes Historical Provider, MD   Physical Exam: Filed Vitals:   06/23/13 1606 06/23/13 1611 06/23/13 1625 06/23/13 1800  BP: 87/76 117/66 122/72 106/78  Pulse: 101   95  Temp: 98.2 F (36.8 C)     TempSrc: Oral     Resp: 26   23  Height:   5\' 9"  (1.753 m)   Weight:   142.429 kg (314 lb)   SpO2: 75% 88% 92% 93%     General:  No apparent distress, speaks in full sentences, tachypneic  Eyes: no scleral icterus  ENT: moist oropharynx  Neck: supple, no JVD  Cardiovascular: regular rate without MRG; 2+ peripheral pulses  Respiratory: Decreased breath sounds throughout, bilateral rhonchi present  Abdomen: soft, non tender to palpation,  positive bowel sounds, no guarding, no rebound  Skin: no rashes  Musculoskeletal: no peripheral edema  Psychiatric: normal mood and affect  Neurologic: Nonfocal  Labs on Admission:  Basic Metabolic Panel:  Recent Labs Lab 06/23/13 1713  NA 140  K 4.0  CL 101  CO2 26  GLUCOSE 127*  BUN 16  CREATININE 0.80  CALCIUM 9.2   CBC:  Recent Labs Lab 06/23/13 1713  WBC 16.0*  NEUTROABS 14.2*  HGB 8.1*  HCT 26.1*  MCV 77.4*  PLT 344   BNP (last 3 results)  Recent Labs  06/23/13 1713  PROBNP 582.7*   Radiological Exams on Admission: Ct Angio Chest Pe W/cm &/or Wo Cm  06/23/2013   CLINICAL DATA:  Shortness of breath.  Hemoptysis.  EXAM: CT ANGIOGRAPHY CHEST WITH CONTRAST  TECHNIQUE: Multidetector CT imaging of the chest was performed using the standard protocol during bolus administration of intravenous contrast. Multiplanar CT image reconstructions and MIPs were obtained to evaluate the vascular anatomy.  CONTRAST:  134mL OMNIPAQUE IOHEXOL 350 MG/ML SOLN  COMPARISON:  Chest x-ray earlier today.  FINDINGS: No filling defects in the pulmonary arteries to suggest pulmonary emboli. Heart is normal size. Aorta is normal caliber.  Severe bilateral ground-glass airspace opacities with relative sparing of the lung bases. This presumably represents infection or hemorrhage. No pleural effusions. Scattered small mediastinal lymph nodes, none pathologically enlarged. No hilar or axillary adenopathy.  Chest wall soft tissues are unremarkable. Imaging into the upper abdomen shows no acute findings.  Review of the MIP images confirms the above findings.  IMPRESSION: No evidence of pulmonary embolus.  Severe bilateral ground-glass airspace opacities with relative sparing of the lung bases. This is likely scratch head this presumably represents diffuse infection/pneumonia or hemorrhage.   Electronically Signed   By: Rolm Baptise M.D.   On: 06/23/2013 19:05   Dg Chest Port 1 View  06/23/2013    CLINICAL DATA:  Shortness of breath, cough and hemoptysis.  EXAM: PORTABLE CHEST - 1 VIEW  COMPARISON:  09/23/2012  FINDINGS: The cardiac silhouette, mediastinal and hilar contours are within normal limits and stable. There is a diffuse airspace process. This could be diffuse infection or inflammation. Noncardiogenic pulmonary edema is also possible but less likely. No pleural effusion. The bony thorax is intact.  IMPRESSION: Diffuse and fairly fulminant bilateral airspace process unlikely noncardiogenic pulmonary edema and more likely infection or severe inflammation.   Electronically Signed   By: Kalman Jewels M.D.   On: 06/23/2013 17:28    EKG: Independently reviewed. Sinus rhythm  Assessment/Plan Principal Problem:   Hemoptysis Active Problems:   Obesity (BMI 30-39.9)   Anxiety associated with depression   Anemia   Respiratory failure with hypoxia  Hemoptysis due to ?DAH - concerning CT scan for diffuse alveolar hemorrhage given clinical presentation as well as anemia with a 2 point drop from her baseline. -  consulted PCCM, ?bronch vs other testing - she has no renal failure, will check inflammatory markers first to r/o systemic inflammatory conditions, if positive consider ordering in am Anti-GBM antibodies, ANA, anti-double-stranded DNA, ANCA, complements, anticardiolipin antibodies, RF and so on.  - s/p Ceftriaxone/Azithromycin in the ED, continue antibiotics for infection, discontinue if cultures remain negative.   Hypoxic respiratory failure - we'll obtain an ABG, PCCM consulted. Patient is breathing comfortably for now, however is pretty tachypneic and desaturates to mid 80s when talking. Appreciate pulmonology input.  Anemia - likely due to #1, recheck in am.  Obesity  Anxiety - continue home medications  History of sigmoid perforation - this is stable at this time, has no symptoms, continue MiraLax.  Elevated BNP - check 2D echo    Diet: NPO until evaluated by  Pulm Fluids: NS DVT Prophylaxis: SCDs  Code Status: Full  Family Communication: d/w patient and sister Disposition Plan: inpatient  Time spent: 67  This note has been created with Surveyor, quantity. Any transcriptional errors are unintentional.   Costin M. Cruzita Lederer, MD [facility] Pager [phone]  If 7PM-7AM, please contact night-coverage [email or web address] Password [email or web address] 06/23/2013, 8:00 PM

## 2013-06-23 NOTE — ED Provider Notes (Signed)
TIME SEEN: 4:31 PM  CHIEF COMPLAINT: Shortness of breath, hypoxia  HPI: Patient is a 53 year old female with history of obesity, tobacco use, GERD who presents emergency Department shortness of breath that started last night. She states she's also had hemoptysis and is coughing up teaspoons up blood for the past 2 days. No chest pain. She does not wear oxygen at home. No history of tuberculosis, IV drug use, incarceration, recent travel, sick contacts. No history of PE or DVT. No recent prolonged immobilization, fracture, surgery, trauma. She has had several weeks of right ear redness, swelling and pain and was on antibiotics but she cannot recall the name of this medication.  Upon EMS arrival, patient had oxygen saturation in the 70s.  ROS: See HPI Constitutional: no fever  Eyes: no drainage  ENT: no runny nose   Cardiovascular:  no chest pain  Resp:  SOB  GI: no vomiting GU: no dysuria Integumentary: no rash  Allergy: no hives  Musculoskeletal: no leg swelling  Neurological: no slurred speech ROS otherwise negative  PAST MEDICAL HISTORY/PAST SURGICAL HISTORY:  Past Medical History  Diagnosis Date  . Colitis   . Obesity (BMI 30-39.9) 09/23/2012  . Anxiety associated with depression 09/23/2012  . Hx of tobacco use, presenting hazards to health 09/23/2012  . H/O gastroesophageal reflux (GERD) 09/23/2012  . At high risk for falls 09/23/2012    She has had a fractured ankle and tendon tear with falls over the last couple years.  . Diverticulitis of large intestine with perforation 09/23/2012  . GERD (gastroesophageal reflux disease)     MEDICATIONS:  Prior to Admission medications   Medication Sig Start Date End Date Taking? Authorizing Provider  ALPRAZolam Duanne Moron) 1 MG tablet Take 1 mg by mouth 3 (three) times daily as needed for anxiety.  08/30/12   Historical Provider, MD  buPROPion (WELLBUTRIN XL) 300 MG 24 hr tablet Take 300 mg by mouth daily.  08/30/12   Historical Provider, MD   ciprofloxacin (CIPRO) 500 MG tablet  01/15/13   Historical Provider, MD  citalopram (CELEXA) 20 MG tablet Take 40 mg by mouth daily.    Historical Provider, MD  metroNIDAZOLE (FLAGYL) 500 MG tablet  01/15/13   Historical Provider, MD  ORTHO-CYCLEN, 28, 0.25-35 MG-MCG tablet Take 1 tablet by mouth daily.  08/30/12   Historical Provider, MD  polyethylene glycol (MIRALAX / GLYCOLAX) packet Take 17 g by mouth 2 (two) times daily as needed (constipation).     Historical Provider, MD    ALLERGIES:  Allergies  Allergen Reactions  . Sulfa Antibiotics     SOCIAL HISTORY:  History  Substance Use Topics  . Smoking status: Former Smoker    Quit date: 01/09/2001  . Smokeless tobacco: Never Used  . Alcohol Use: No    FAMILY HISTORY: Family History  Problem Relation Age of Onset  . Cancer Paternal Grandmother     breast    EXAM: BP 122/72  Pulse 101  Temp(Src) 98.2 F (36.8 C) (Oral)  Resp 26  Ht 5\' 9"  (1.753 m)  Wt 314 lb (142.429 kg)  BMI 46.35 kg/m2  SpO2 92% CONSTITUTIONAL: Alert and oriented and responds appropriately to questions. Well-appearing; well-nourished HEAD: Normocephalic EYES: Conjunctivae clear, PERRL ENT: normal nose; no rhinorrhea; moist mucous membranes; pharynx without lesions noted NECK: Supple, no meningismus, no LAD  CARD: RRR; S1 and S2 appreciated; no murmurs, no clicks, no rubs, no gallops RESP: Normal chest excursion without splinting or tachypnea; patient is hypoxic, mild crackles  at left anterior upper lobe; good aeration, no wheezing or rhonchi ABD/GI: Normal bowel sounds; non-distended; soft, non-tender, no rebound, no guarding BACK:  The back appears normal and is non-tender to palpation, there is no CVA tenderness EXT: Normal ROM in all joints; non-tender to palpation; no edema; normal capillary refill; no cyanosis    SKIN: Normal color for age and race; warm NEURO: Moves all extremities equally PSYCH: The patient's mood and manner are appropriate.  Grooming and personal hygiene are appropriate.  MEDICAL DECISION MAKING: Patient here with hypoxia, hemoptysis. Differential diagnosis includes pneumonia, PE, tuberculosis. We'll obtain cardiac labs, chest x-ray but I feel patient will also need a CT of her chest. Her oxygen saturation is improving with oxygen but is still in the low 90s. We'll give breathing present as well given her prior history of tobacco use. She denies a history of asthma or COPD.  ED PROGRESS: EKG shows no new ischemic changes. Troponin negative. BNP is only slightly greater than 500. She does have a leukocytosis of 16 with left shift. Chest x-ray shows diffuse bilateral airspace opacities that is more likely infection versus inflammation. Given her leukocytosis, will treat for community acquired pneumonia. CT of her chest is pending.   7:26 PM  Pt's oxygen saturation is 90-93% on 4 L nasal cannula. CT chest shows severe bilateral groundglass air space opacities that represents diffuse infection versus hemorrhage. She has not had any further hemoptysis in the emergency department and describes her hemoptysis is only "teaspoons". We'll discuss with hospitalist for admission to step down.  PCP is Dr. Redmond Pulling at [facility] in [place].   EKG Interpretation  Date/Time:  Monday June 23 2013 16:13:03 EDT Ventricular Rate:  98 PR Interval:  161 QRS Duration: 85 QT Interval:  333 QTC Calculation: 425 R Axis:   67 Text Interpretation:  Sinus rhythm Consider left atrial enlargement Borderline repolarization abnormality Confirmed by Chanee Henrickson,  DO, Gianno Volner [id number]) on 06/23/2013 4:21:05 PM         [place], DO 06/23/13 1952

## 2013-06-23 NOTE — ED Notes (Signed)
Upon arrival pt 02 sats decreased to 88% RA placed on 2L [place] with no increase. Increased to 4L [place] with increase in 02 sats to 89. RR increased to 26-28 . Pt placed on NRB for support. After 10 minutes NRB removed Pt placed on 4L[place] and 02 stas 92 RR 24-28. Denies CP- BP rechecked  Right side 122/72  Left side 109/90

## 2013-06-23 NOTE — ED Notes (Signed)
MD at bedside. 

## 2013-06-23 NOTE — ED Notes (Signed)
Family at bedside. 

## 2013-06-23 NOTE — ED Notes (Signed)
Bed: [id number] Expected date:  Expected time:  Means of arrival:  Comments: SOB

## 2013-06-23 NOTE — Progress Notes (Signed)
ANTIBIOTIC CONSULT NOTE - INITIAL  Pharmacy Consult for Levaquin Indication: CAP  Allergies  Allergen Reactions  . Sulfa Antibiotics     Patient Measurements: Height: 5\' 9"  (175.3 cm) Weight: 314 lb (142.429 kg) IBW/kg (Calculated) : 66.2 Adjusted Body Weight:   Vital Signs: Temp: 98.2 F (36.8 C) (06/15 1606) Temp src: Oral (06/15 1606) BP: 106/78 mmHg (06/15 1800) Pulse Rate: 95 (06/15 1800) Intake/Output from previous day:   Intake/Output from this shift:    Labs:  Recent Labs  06/23/13 1713  WBC 16.0*  HGB 8.1*  PLT 344  CREATININE 0.80   Estimated Creatinine Clearance: 125.6 ml/min (by C-G formula based on Cr of 0.8). No results found for this basename: VANCOTROUGH, VANCOPEAK, VANCORANDOM, GENTTROUGH, GENTPEAK, GENTRANDOM, TOBRATROUGH, TOBRAPEAK, TOBRARND, AMIKACINPEAK, AMIKACINTROU, AMIKACIN,  in the last 72 hours   Microbiology: No results found for this or any previous visit (from the past 720 hour(s)).  Medical History: Past Medical History  Diagnosis Date  . Colitis   . Obesity (BMI 30-39.9) 09/23/2012  . Anxiety associated with depression 09/23/2012  . Hx of tobacco use, presenting hazards to health 09/23/2012  . H/O gastroesophageal reflux (GERD) 09/23/2012  . At high risk for falls 09/23/2012    She has had a fractured ankle and tendon tear with falls over the last couple years.  . Diverticulitis of large intestine with perforation 09/23/2012  . GERD (gastroesophageal reflux disease)     Medications:  Scheduled:   Infusions:  . cefTRIAXone (ROCEPHIN)  IV 1 g (06/23/13 2037)  . levofloxacin (LEVAQUIN) IV     PRN: fentaNYL, HYDROcodone-acetaminophen Assessment: 53 yo Female with symptoms of coughing and hemoptysis.   Request for pharmacy to dose Levaquin for CAP.   Goal of Therapy:  Eradication of infection  Plan:  1.  Begin Levaquin 750mg  IV q 24 hours 2.  Follow up culture results  Gypsy Decant 06/23/2013,8:47 PM

## 2013-06-23 NOTE — ED Notes (Signed)
Per [facility]- Pt c/o of Shortness of breath sudden onset 1 hour. Coughing up blood x 2 days. Denies CP. Pt hyperinflating on scene. Found inside car with door outside with open and air on. Calmed with encouragement.  15LNRB by FIRE 02 sats 100%. NSR. Pt decreased by EMS to RA 98%. Upon arrival vitals taken see record

## 2013-06-24 DIAGNOSIS — I7782 Antineutrophilic cytoplasmic antibody (ANCA) vasculitis: Secondary | ICD-10-CM

## 2013-06-24 DIAGNOSIS — F341 Dysthymic disorder: Secondary | ICD-10-CM

## 2013-06-24 DIAGNOSIS — I7789 Other specified disorders of arteries and arterioles: Secondary | ICD-10-CM

## 2013-06-24 DIAGNOSIS — I776 Arteritis, unspecified: Secondary | ICD-10-CM | POA: Diagnosis present

## 2013-06-24 DIAGNOSIS — I517 Cardiomegaly: Secondary | ICD-10-CM

## 2013-06-24 DIAGNOSIS — D649 Anemia, unspecified: Secondary | ICD-10-CM

## 2013-06-24 HISTORY — DX: Arteritis, unspecified: I77.6

## 2013-06-24 HISTORY — DX: Antineutrophilic cytoplasmic antibody (ANCA) vasculitis: I77.82

## 2013-06-24 LAB — URINALYSIS, ROUTINE W REFLEX MICROSCOPIC
Bilirubin Urine: NEGATIVE
Glucose, UA: NEGATIVE mg/dL
Ketones, ur: NEGATIVE mg/dL
Nitrite: NEGATIVE
PROTEIN: 100 mg/dL — AB
Specific Gravity, Urine: >1.046 — ABNORMAL HIGH (ref 1.005–1.030)
UROBILINOGEN UA: 0.2 mg/dL (ref 0.0–1.0)
pH: 6 (ref 5.0–8.0)

## 2013-06-24 LAB — URINE MICROSCOPIC-ADD ON

## 2013-06-24 LAB — COMPREHENSIVE METABOLIC PANEL
ALBUMIN: 2.8 g/dL — AB (ref 3.5–5.2)
ALK PHOS: 98 U/L (ref 39–117)
ALT: 10 U/L (ref 0–35)
AST: 10 U/L (ref 0–37)
BUN: 15 mg/dL (ref 6–23)
CALCIUM: 8.7 mg/dL (ref 8.4–10.5)
CO2: 26 mEq/L (ref 19–32)
Chloride: 100 mEq/L (ref 96–112)
Creatinine, Ser: 0.83 mg/dL (ref 0.50–1.10)
GFR calc non Af Amer: 80 mL/min — ABNORMAL LOW (ref >90)
GLUCOSE: 94 mg/dL (ref 70–99)
POTASSIUM: 3.6 meq/L — AB (ref 3.7–5.3)
SODIUM: 139 meq/L (ref 137–147)
TOTAL PROTEIN: 6.7 g/dL (ref 6.0–8.3)
Total Bilirubin: 0.4 mg/dL (ref 0.3–1.2)

## 2013-06-24 LAB — ANTI-SCLERODERMA ANTIBODY: Scleroderma (Scl-70) (ENA) Antibody, IgG: <1

## 2013-06-24 LAB — GLUCOSE, CAPILLARY
Glucose-Capillary: 159 mg/dL — ABNORMAL HIGH (ref 70–99)
Glucose-Capillary: 174 mg/dL — ABNORMAL HIGH (ref 70–99)
Glucose-Capillary: 191 mg/dL — ABNORMAL HIGH (ref 70–99)

## 2013-06-24 LAB — CBC
HCT: 24.1 % — ABNORMAL LOW (ref 36.0–46.0)
HEMATOCRIT: 22.2 % — AB (ref 36.0–46.0)
HEMOGLOBIN: 6.8 g/dL — AB (ref 12.0–15.0)
HEMOGLOBIN: 7.4 g/dL — AB (ref 12.0–15.0)
MCH: 24 pg — ABNORMAL LOW (ref 26.0–34.0)
MCH: 24.4 pg — ABNORMAL LOW (ref 26.0–34.0)
MCHC: 30.6 g/dL (ref 30.0–36.0)
MCHC: 30.7 g/dL (ref 30.0–36.0)
MCV: 78.4 fL (ref 78.0–100.0)
MCV: 79.5 fL (ref 78.0–100.0)
Platelets: 324 10*3/uL (ref 150–400)
Platelets: 298 10*3/uL (ref 150–400)
RBC: 2.83 MIL/uL — ABNORMAL LOW (ref 3.87–5.11)
RBC: 3.03 MIL/uL — ABNORMAL LOW (ref 3.87–5.11)
RDW: 13.9 % (ref 11.5–15.5)
RDW: 14.3 % (ref 11.5–15.5)
WBC: 15.1 10*3/uL — ABNORMAL HIGH (ref 4.0–10.5)
WBC: 14.7 10*3/uL — ABNORMAL HIGH (ref 4.0–10.5)

## 2013-06-24 LAB — RHEUMATOID FACTOR

## 2013-06-24 LAB — PROTIME-INR
INR: 1.22 (ref 0.00–1.49)
Prothrombin Time: 15.1 seconds (ref 11.6–15.2)

## 2013-06-24 LAB — APTT: APTT: 42 s — AB (ref 24–37)

## 2013-06-24 LAB — PREPARE RBC (CROSSMATCH)

## 2013-06-24 LAB — CK TOTAL AND CKMB (NOT AT ARMC)
CK, MB: 2.2 ng/mL (ref 0.3–4.0)
RELATIVE INDEX: INVALID (ref 0.0–2.5)
Total CK: 31 U/L (ref 7–177)

## 2013-06-24 LAB — MAGNESIUM: Magnesium: 1.9 mg/dL (ref 1.5–2.5)

## 2013-06-24 LAB — PROCALCITONIN: Procalcitonin: <0.1 ng/mL

## 2013-06-24 LAB — ANCA SCREEN W REFLEX TITER
ATYPICAL P-ANCA SCREEN: NEGATIVE
c-ANCA Screen: NEGATIVE
p-ANCA Screen: POSITIVE — AB

## 2013-06-24 LAB — SEDIMENTATION RATE: SED RATE: 90 mm/h — AB (ref 0–22)

## 2013-06-24 LAB — PHOSPHORUS: Phosphorus: 3 mg/dL (ref 2.3–4.6)

## 2013-06-24 LAB — TROPONIN I

## 2013-06-24 LAB — LACTIC ACID, PLASMA: LACTIC ACID, VENOUS: 0.8 mmol/L (ref 0.5–2.2)

## 2013-06-24 LAB — CYCLIC CITRUL PEPTIDE ANTIBODY, IGG: Cyclic Citrullin Peptide Ab: <2 U/mL (ref 0.0–5.0)

## 2013-06-24 LAB — PRO B NATRIURETIC PEPTIDE
Pro B Natriuretic peptide (BNP): 522.3 pg/mL — ABNORMAL HIGH (ref 0–125)
Pro B Natriuretic peptide (BNP): 599.2 pg/mL — ABNORMAL HIGH (ref 0–125)

## 2013-06-24 LAB — HIV ANTIBODY (ROUTINE TESTING W REFLEX): HIV 1&2 Ab, 4th Generation: NONREACTIVE

## 2013-06-24 LAB — C-REACTIVE PROTEIN: CRP: 22 mg/dL — ABNORMAL HIGH (ref <0.60)

## 2013-06-24 LAB — ANCA TITERS: P-ANCA: 1:640 {titer} — ABNORMAL HIGH

## 2013-06-24 LAB — ANA: ANA: NEGATIVE

## 2013-06-24 LAB — GLOMERULAR BASEMENT MEMBRANE ANTIBODIES: GBM Ab: <1

## 2013-06-24 LAB — MPO/PR-3 (ANCA) ANTIBODIES: Serine Protease 3: <1

## 2013-06-24 LAB — ANTI-DNA ANTIBODY, DOUBLE-STRANDED: ds DNA Ab: <1 IU/mL

## 2013-06-24 MED ORDER — SODIUM CHLORIDE 0.9 % IV SOLN
500.0000 mg | Freq: Four times a day (QID) | INTRAVENOUS | Status: AC
  Administered 2013-06-24 – 2013-06-28 (×18): 500 mg via INTRAVENOUS
  Filled 2013-06-24 (×20): qty 4

## 2013-06-24 MED ORDER — POTASSIUM CHLORIDE CRYS ER 20 MEQ PO TBCR
40.0000 meq | EXTENDED_RELEASE_TABLET | Freq: Once | ORAL | Status: AC
  Administered 2013-06-24: 40 meq via ORAL
  Filled 2013-06-24: qty 2

## 2013-06-24 MED ORDER — METOCLOPRAMIDE HCL 5 MG/ML IJ SOLN
5.0000 mg | Freq: Three times a day (TID) | INTRAMUSCULAR | Status: DC | PRN
  Administered 2013-07-01: 5 mg via INTRAVENOUS
  Filled 2013-06-24: qty 2

## 2013-06-24 MED ORDER — PANTOPRAZOLE SODIUM 40 MG PO TBEC
40.0000 mg | DELAYED_RELEASE_TABLET | Freq: Every day | ORAL | Status: DC
  Administered 2013-06-24 – 2013-06-25 (×2): 40 mg via ORAL
  Filled 2013-06-24 (×3): qty 1

## 2013-06-24 MED ORDER — ONDANSETRON HCL 4 MG/2ML IJ SOLN
4.0000 mg | Freq: Once | INTRAMUSCULAR | Status: AC
  Administered 2013-06-24: 4 mg via INTRAVENOUS
  Filled 2013-06-24: qty 2

## 2013-06-24 MED ORDER — FUROSEMIDE 10 MG/ML IJ SOLN
20.0000 mg | Freq: Once | INTRAMUSCULAR | Status: AC
  Administered 2013-06-24: 20 mg via INTRAVENOUS
  Filled 2013-06-24: qty 2

## 2013-06-24 MED ORDER — INSULIN ASPART 100 UNIT/ML ~~LOC~~ SOLN
2.0000 [IU] | SUBCUTANEOUS | Status: DC
  Administered 2013-06-24 (×3): 4 [IU] via SUBCUTANEOUS
  Administered 2013-06-25 (×2): 6 [IU] via SUBCUTANEOUS
  Administered 2013-06-25: 4 [IU] via SUBCUTANEOUS
  Administered 2013-06-25 (×3): 2 [IU] via SUBCUTANEOUS
  Administered 2013-06-26 (×4): 4 [IU] via SUBCUTANEOUS
  Administered 2013-06-26: 6 [IU] via SUBCUTANEOUS
  Administered 2013-06-26 – 2013-06-27 (×3): 4 [IU] via SUBCUTANEOUS
  Administered 2013-06-27: 2 [IU] via SUBCUTANEOUS
  Administered 2013-06-27: 6 [IU] via SUBCUTANEOUS
  Administered 2013-06-27 – 2013-06-28 (×2): 4 [IU] via SUBCUTANEOUS
  Administered 2013-06-28: 6 [IU] via SUBCUTANEOUS
  Administered 2013-06-28: 4 [IU] via SUBCUTANEOUS

## 2013-06-24 NOTE — Progress Notes (Signed)
Patient ID: [name], female   DOB: [birth year], 53 y.o.   MRN: [id number] [facility] PROGRESS NOTE  [name] [id number] DOB: 02/19/1960 DOA: 06/23/2013 PCP: [name], MD  Brief narrative: 53 y.o. female with h/o obesity, diverticulitis with sigmoid colon perforation, and GERD who presented to [facility] ED on 6/15 by EMS with c/o hemoptysis that started 2 days prior to this admission, associated with cough. Her O2 sats were in the 70's upon EMS arrival. She was weaned to 4L [place] in the ED. Subsequent CT chest in ED with severe bilateral groundglass airspace opacities suspicious for infection/hemorrhage and admitted to SDU. 6/16 PCCM consulted.   Principal Problem: Acute Hypoxemic Respiratory Failure due to Diffuse Alveolar Hemorrhage.  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, ANCA vasculitis, steroids started per PCCM   - appreciate PCCM input  - no evidence of CHF on 2 D ECHO, EF 55 - 60 % - Cont tele monitoring  - Even to neg fluid balance  GERD - continue PPI  - Start clear liquid diet  - Start Reglan  Anemia associated with alveolar hemorrhage  - Received 1 unit PRBC for hgb < 7gm% - repeat CBC improved  - Hg trend since admission: 8.1 --> 6.8  - repeat CBC again in AM Steroid induced hyperglycemia  - SSI-->High levels of steroids for alveolar hemorrhage Hypokalemia - mild, will supplement and repeat BMP in AM  Consultants:  PCCM Procedures/Studies: CT Angio Chest 06/23/2013  No evidence of pulmonary embolus.  Severe bilateral ground-glass airspace opacities with relative sparing of the lung bases. This is likely scratch head this presumably represents diffuse infection/pneumonia or hemorrhage.    CXR 06/23/2013  Diffuse and fairly fulminant bilateral airspace process unlikely noncardiogenic pulmonary edema and more likely  infection or severe inflammation.   ECHO 6/16: mild basal hypertrophy of septum, EF 55-60%, Right ventricle mildly dilated  Lines Irven Coe:   PIV  Cultures:   Blood cx x2 >>>   Resp virus panel 6/15 >>>   UA 6/15 - moderate leukocytes   Urine leg 6/15 >>>   Re-order urine strep? >>>   HIV non reactive  Autoimmune Panel 6/15:   ANA - negative   CRP - 22   Sed rate - 90   p-ANCA screen - positive   p -ANAC - 1:640   Atypical pANCA - negative   Myeloperoxidase Abs - >8.0   DNA - <1   RF <10   CCP <2.0  Antibiotics:   Levaquin 6/15 >>>   Ceftriaxone 6/15 >>> one dose 6/15   Azithromycin 6/15 >>> one dose 6/15  Code Status: Full Family Communication: Pt and sister at bedside Disposition Plan: Home when medically stable  HPI/Subjective: No events overnight.   Objective: Filed Vitals:   06/24/13 0400 06/24/13 0456 06/24/13 0500 06/24/13 0515  BP: 97/49 116/58 109/44 111/47  Pulse: 93 93 94 94  Temp:  98.8 F (37.1 C)  98.6 F (37 C)  TempSrc:  Oral  Oral  Resp: 35 54 47 31  Height:      Weight:   143.8 kg (317 lb 0.3 oz)   SpO2: 95% 95% 95% 96%    Intake/Output Summary (Last 24 hours) at 06/24/13 0708 Last data filed at 06/24/13 0000  Gross per 24 hour  Intake    120 ml  Output     10 ml  Net    110 ml    Exam:   General:  Pt is alert, follows commands appropriately, not in acute distress  Cardiovascular: Regular rate and rhythm, S1/S2, no murmurs, no rubs, no gallops  Respiratory: Diminished breath sounds at bases with crackles   Abdomen: Soft, non tender, non distended, bowel sounds present, no guarding  Extremities: No edema, pulses DP and PT palpable bilaterally  Neuro: Grossly nonfocal  Data Reviewed: Basic Metabolic Panel:  Recent Labs Lab 06/23/13 1713 06/24/13 0304  NA 140 139  K 4.0 3.6*  CL 101 100  CO2 26 26  GLUCOSE 127* 94  BUN 16 15  CREATININE 0.80 0.83  CALCIUM 9.2 8.7  MG  --  1.9  PHOS  --  3.0    Liver Function Tests:  Recent Labs Lab 06/24/13 0304  AST 10  ALT 10  ALKPHOS 98  BILITOT 0.4  PROT 6.7  ALBUMIN 2.8*   CBC:  Recent Labs Lab 06/23/13 1713 06/24/13 0304  WBC 16.0* 15.1*  NEUTROABS 14.2*  --   HGB 8.1* 6.8*  HCT 26.1* 22.2*  MCV 77.4* 78.4  PLT 344 324   Cardiac Enzymes:  Recent Labs Lab 06/23/13 2345 06/24/13 0304  CKTOTAL 31  --   CKMB 2.2  --   TROPONINI <0.30 <0.30    Recent Results (from the past 240 hour(s))  MRSA PCR SCREENING     Status: None   Collection Time    06/23/13 10:23 PM      Result Value Ref Range Status   MRSA by PCR NEGATIVE  NEGATIVE Final   Comment:            The GeneXpert MRSA Assay (FDA     approved for NASAL specimens     only), is one component of a     comprehensive MRSA colonization     surveillance program. It is not     intended to diagnose MRSA     infection nor to guide or     monitor treatment for     MRSA infections.     Scheduled Meds: . buPROPion  300 mg Oral Daily  . citalopram  40 mg Oral Daily  . levofloxacin (LEVAQUIN) IV  750 mg Intravenous Q24H   Continuous Infusions:    Faye Ramsay, MD  [facility] Pager [phone]  If 7PM-7AM, please contact night-coverage [email or web address] Password [email or web address] 06/24/2013, 7:08 AM   LOS: 1 day

## 2013-06-24 NOTE — Progress Notes (Signed)
  Echocardiogram 2D Echocardiogram has been performed.  [name], [name] 06/24/2013, 1:13 PM

## 2013-06-24 NOTE — Consult Note (Signed)
PULMONARY / CRITICAL CARE MEDICINE   Name: [name] MRN: [id number] DOB: 04-08-1960    ADMISSION DATE:  06/23/2013 CONSULTATION DATE:  06/24/13  REFERRING MD : Doyle Askew PRIMARY SERVICE: Hospitalist  CHIEF COMPLAINT: Shortness of breath, hemoptysis, diffuse alveolar hemorrhage  BRIEF PATIENT DESCRIPTION: 53 y.o. Female with h/o obesity, diverticulitis with sigmoid colon perforation, and GERD who presented to [facility] ED on 6/15 by EMS with c/o hemoptysis 2 days ago and cough for 2-3 days. Her O2 sats were in the 70's upon EMS arrival. She was weaned to 4L [place] in the ED. Subsequent CT chest in ED with severe bilateral groundglass airspace opacities suspicious for infection/hemorrhage and admitted to SDU. 6/16 PCCM consulted.   SIGNIFICANT EVENTS / STUDIES:  6/15 admission, CT chest with severe bilateral groundglass airspace opacities suspicious for infection/hemorrhage 6/16 PCCM consulted  Echo 6/16: mild basal hypertrophy of septum, EF 55-60%, Right ventricle mildly dilated   LINES / TUBES: PIV  CULTURES: Blood cx x2 >>> Resp virus panel 6/15 >>> UA 6/15 - moderate leukocytes Urine leg 6/15 >>> Re-order urine strep? >>>  Autoimmune Panel 6/15: ANA - negative CRP - 22 Sed rate - 90 p-ANCA screen - positive p -ANAC - 1:640 Atypical pANCA - negative Myeloperoxidase Abs -  >8.0 DNA - <1 RF <10 CCP <2.0 HIV - nonreactive   ANTIBIOTICS: Levaquin 6/15 >>> Ceftriaxone 6/15 >>> one dose 6/15 Azithromycin 6/15 >>> one dose 6/15  SUBJECTIVE/OVER NIGHT EVENTS:  Worsening shortness of breath, aggrevated by lying down  VITAL SIGNS: Temp:  [98.2 F (36.8 C)-99.3 F (37.4 C)] 99 F (37.2 C) (06/16 0800) Pulse Rate:  [92-101] 92 (06/16 0615) Resp:  [15-54] 36 (06/16 0615) BP: (87-138)/(44-78) 100/45 mmHg (06/16 0615) SpO2:  [75 %-100 %] 96 % (06/16 0615) FiO2 (%):  [40 %] 40 % (06/16 0515) Weight:  [142.429 kg (314 lb)-143.8 kg (317 lb 0.3 oz)] 143.8 kg (317 lb 0.3 oz)  (06/16 0500)   INTAKE / OUTPUT: Intake/Output     06/15 0701 - 06/16 0700 06/16 0701 - 06/17 0700   P.O. 120    Total Intake(mL/kg) 120 (0.8)    Urine (mL/kg/hr) 110    Total Output 110     Net +10            PHYSICAL EXAMINATION: General: acutely ill appearing obese female Neuro: A/Ox4, MAEs HEENT: 6L [place] Cardiovascular: RRR Lungs: Right > Left crackles Abdomen: Obese, soft, epigastric pain  Musculoskeletal: no cyanosis, no clubbing, no deformity Skin: intact, no edema  LABS:  CBC  Recent Labs Lab 06/23/13 1713 06/24/13 0304 06/24/13 1009  WBC 16.0* 15.1* 14.7*  HGB 8.1* 6.8* 7.4*  HCT 26.1* 22.2* 24.1*  PLT 344 324 298   Coag's  Recent Labs Lab 06/23/13 2345  APTT 42*  INR 1.22   BMET  Recent Labs Lab 06/23/13 1713 06/24/13 0304  NA 140 139  K 4.0 3.6*  CL 101 100  CO2 26 26  BUN 16 15  CREATININE 0.80 0.83  GLUCOSE 127* 94   Electrolytes  Recent Labs Lab 06/23/13 1713 06/24/13 0304  CALCIUM 9.2 8.7  MG  --  1.9  PHOS  --  3.0   Sepsis Markers  Recent Labs Lab 06/23/13 1904 06/23/13 2345 06/24/13 0304  LATICACIDVEN 1.21 0.8  --   PROCALCITON  --  <0.10 <0.10   ABG  Recent Labs Lab 06/23/13 2120  PHART 7.429  PCO2ART 38.6  PO2ART 62.4*   Liver Enzymes  Recent  Labs Lab 06/24/13 0304  AST 10  ALT 10  ALKPHOS 98  BILITOT 0.4  ALBUMIN 2.8*   Cardiac Enzymes  Recent Labs Lab 06/23/13 1713 06/23/13 2345 06/24/13 0304  TROPONINI  --  <0.30 <0.30  PROBNP 582.7* 599.2* 522.3*   Glucose No results found for this basename: GLUCAP,  in the last 168 hours  Imaging Ct Angio Chest Pe W/cm &/or Wo Cm  06/23/2013   CLINICAL DATA:  Shortness of breath.  Hemoptysis.  EXAM: CT ANGIOGRAPHY CHEST WITH CONTRAST  TECHNIQUE: Multidetector CT imaging of the chest was performed using the standard protocol during bolus administration of intravenous contrast. Multiplanar CT image reconstructions and MIPs were obtained to evaluate  the vascular anatomy.  CONTRAST:  168m OMNIPAQUE IOHEXOL 350 MG/ML SOLN  COMPARISON:  Chest x-ray earlier today.  FINDINGS: No filling defects in the pulmonary arteries to suggest pulmonary emboli. Heart is normal size. Aorta is normal caliber.  Severe bilateral ground-glass airspace opacities with relative sparing of the lung bases. This presumably represents infection or hemorrhage. No pleural effusions. Scattered small mediastinal lymph nodes, none pathologically enlarged. No hilar or axillary adenopathy.  Chest wall soft tissues are unremarkable. Imaging into the upper abdomen shows no acute findings.  Review of the MIP images confirms the above findings.  IMPRESSION: No evidence of pulmonary embolus.  Severe bilateral ground-glass airspace opacities with relative sparing of the lung bases. This is likely scratch head this presumably represents diffuse infection/pneumonia or hemorrhage.   Electronically Signed   By: KRolm BaptiseM.D.   On: 06/23/2013 19:05   Dg Chest Port 1 View  06/23/2013   CLINICAL DATA:  Shortness of breath, cough and hemoptysis.  EXAM: PORTABLE CHEST - 1 VIEW  COMPARISON:  09/23/2012  FINDINGS: The cardiac silhouette, mediastinal and hilar contours are within normal limits and stable. There is a diffuse airspace process. This could be diffuse infection or inflammation. Noncardiogenic pulmonary edema is also possible but less likely. No pleural effusion. The bony thorax is intact.  IMPRESSION: Diffuse and fairly fulminant bilateral airspace process unlikely noncardiogenic pulmonary edema and more likely infection or severe inflammation.   Electronically Signed   By: MKalman JewelsM.D.   On: 06/23/2013 17:28    ASSESSMENT / PLAN:  PULMONARY  A:Acute Hypoxemic Respiratory Failure due to Diffuse Alveolar Hemorrhage.  Desaturation over night requiring 6L [place] P:  Start aggressive steroids (Solumedrol 500 Q6h IV) - discussed indication for aggressive steroids with patient and benefit  greater then risk Lasix 298monce F/u CXR in AM Continue o2 for pulse ox goal > 88%  Check autoimmune panel - see face sheet Do ID workup: check urine leg, urine strep and resp virus panel  Might need surgical lung bx if autoimmune negative v empiric Rx  bipap if worse -> intubate if even worse   CARDIOVASCULAR  A: No evidence of CHF  Echo 6/16: mild basal hypertrophy of septum, EF 55-60%, Right ventricle mildly dilated BNP & CEs wnls P: Cont tele monitoring Even to neg fluid balance   RENAL  A:  No evidence of pulmonary - renal syndrome  UA - moderate leukocytes P:  Monitor    GASTROINTESTINAL  A: H/o GERD - epigastric pain Nausea P:  Start PPI Start clear liquid diet  Start Reglan  HEMATOLOGIC  A:  Anemia associated with alveolar hemorrhage Received 1 unit PRBC for hgb < 7gm% - repeat CBC improved P:  F/u CBC in AM  INFECTIOUS  A: Possible pneumonia  on Chest CT 6/15 - more likely alveolar hemorrhage PCT- WNL lactic - WNL P:  D/c levaquin if no clinical indication of infection   ENDOCRINE  A:  Steroid induced hyperglycemia P:  SSI-->High levels of steroids for alveolar hemorrhage   NEUROLOGIC  A:  intact  P:  monitor    TODAY'S SUMMARY: Patient requiring increased amounts of O2 per [place] and required 1 unit PRBCs. Will give high dose IV steroids to treat autoimmune alveolar hemorrhage. Awaiting final results from autoimmune panel. Monitor for hyperglycemia. May need Bipap if hypoxia or possibly intubation.   Lalla Brothers ACNP student Marni Griffon ACNP Pulmonary and [place] Pager: [phone]  06/24/2013, 11:22 AM     STAFF NOTE: I, Dr Ann Lions have personally reviewed patient's available data, including medical history, events of note, physical examination and test results as part of my evaluation. I have discussed with resident/NP and other care providers such as pharmacist, RN and RRT.  In addition,  I  personally evaluated patient and elicited key findings of Acute Hypoxemic Respiratory Failure due to Diffuse Alveolar Hemorrhage. She is worse today with further fall in hgb < 7gm% needng PRBC and worsening hypoxemia. At time of my bedside rounds earlier:  Only ESR was available and given worsening I d/w her and her sister and gave them list of DDx with ANCA vasculitis top and due to worsening she met indication for IV steroids. She has agreed for that to be started. Will do solumedrol 560m IV q6h x 5 days and then lower dose. 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 She will need cytoxan v Rituxan; I favor the latter due to safety profile and ease of administration. I will d/w her on this 06/25/13 (late in day now) and get Rx started.   Rest per NP/medical resident whose note is outlined above and that I agree with  The patient is critically ill with multiple organ systems failure and requires high complexity decision making for assessment and support, frequent evaluation and titration of therapies, application of advanced monitoring technologies and extensive interpretation of multiple databases.   Critical Care Time devoted to patient care services described in this note is  35  Minutes.  Dr. MBrand Males M.D., FFullerton Surgery CenterC.P Pulmonary and Critical Care Medicine Staff Physician CWest LivingstonPulmonary and Critical Care Pager: [phone] If no answer or between  15:00h - 7:00h: call [phone]  06/24/2013 3:54 PM

## 2013-06-25 ENCOUNTER — Inpatient Hospital Stay (HOSPITAL_COMMUNITY): Payer: 59

## 2013-06-25 LAB — RESPIRATORY VIRUS PANEL
ADENOVIRUS: NOT DETECTED
INFLUENZA A H1: NOT DETECTED
INFLUENZA B 1: NOT DETECTED
Influenza A H3: NOT DETECTED
Influenza A: NOT DETECTED
Metapneumovirus: NOT DETECTED
Parainfluenza 1: NOT DETECTED
Parainfluenza 2: NOT DETECTED
Parainfluenza 3: NOT DETECTED
RESPIRATORY SYNCYTIAL VIRUS B: NOT DETECTED
Respiratory Syncytial Virus A: NOT DETECTED
Rhinovirus: NOT DETECTED

## 2013-06-25 LAB — PROCALCITONIN: Procalcitonin: <0.1 ng/mL

## 2013-06-25 LAB — BASIC METABOLIC PANEL
BUN: 16 mg/dL (ref 6–23)
BUN: 20 mg/dL (ref 6–23)
CALCIUM: 9 mg/dL (ref 8.4–10.5)
CHLORIDE: 98 meq/L (ref 96–112)
CO2: 27 meq/L (ref 19–32)
CO2: 26 meq/L (ref 19–32)
CREATININE: 0.82 mg/dL (ref 0.50–1.10)
Calcium: 9.2 mg/dL (ref 8.4–10.5)
Chloride: 100 mEq/L (ref 96–112)
Creatinine, Ser: 0.83 mg/dL (ref 0.50–1.10)
GFR calc Af Amer: >90 mL/min (ref >90)
GFR calc Af Amer: >90 mL/min (ref >90)
GFR calc non Af Amer: 80 mL/min — ABNORMAL LOW (ref >90)
GFR calc non Af Amer: 81 mL/min — ABNORMAL LOW (ref >90)
GLUCOSE: 188 mg/dL — AB (ref 70–99)
Glucose, Bld: 157 mg/dL — ABNORMAL HIGH (ref 70–99)
Potassium: 4 mEq/L (ref 3.7–5.3)
Potassium: 3.6 mEq/L — ABNORMAL LOW (ref 3.7–5.3)
SODIUM: 140 meq/L (ref 137–147)
Sodium: 139 mEq/L (ref 137–147)

## 2013-06-25 LAB — GLUCOSE, CAPILLARY
GLUCOSE-CAPILLARY: 225 mg/dL — AB (ref 70–99)
GLUCOSE-CAPILLARY: 131 mg/dL — AB (ref 70–99)
GLUCOSE-CAPILLARY: 179 mg/dL — AB (ref 70–99)
Glucose-Capillary: 146 mg/dL — ABNORMAL HIGH (ref 70–99)
Glucose-Capillary: 148 mg/dL — ABNORMAL HIGH (ref 70–99)
Glucose-Capillary: 223 mg/dL — ABNORMAL HIGH (ref 70–99)

## 2013-06-25 LAB — TYPE AND SCREEN
ABO/RH(D): O POS
ANTIBODY SCREEN: NEGATIVE
Unit division: 0

## 2013-06-25 LAB — HEPATITIS B CORE ANTIBODY, TOTAL: Hep B Core Total Ab: NONREACTIVE

## 2013-06-25 LAB — CBC
HEMATOCRIT: 25.3 % — AB (ref 36.0–46.0)
HEMOGLOBIN: 7.8 g/dL — AB (ref 12.0–15.0)
MCH: 24.4 pg — AB (ref 26.0–34.0)
MCHC: 30.8 g/dL (ref 30.0–36.0)
MCV: 79.1 fL (ref 78.0–100.0)
Platelets: 324 10*3/uL (ref 150–400)
RBC: 3.2 MIL/uL — ABNORMAL LOW (ref 3.87–5.11)
RDW: 14.1 % (ref 11.5–15.5)
WBC: 12.9 10*3/uL — ABNORMAL HIGH (ref 4.0–10.5)

## 2013-06-25 LAB — HEPATITIS PANEL, ACUTE
HCV Ab: NEGATIVE
Hep A IgM: NONREACTIVE
Hep B C IgM: NONREACTIVE
Hepatitis B Surface Ag: NEGATIVE

## 2013-06-25 LAB — SJOGRENS SYNDROME-B EXTRACTABLE NUCLEAR ANTIBODY: SSB (La) (ENA) Antibody, IgG: <1

## 2013-06-25 LAB — HEPATITIS B SURFACE ANTIBODY,QUALITATIVE: HEP B S AB: NEGATIVE

## 2013-06-25 LAB — LEGIONELLA ANTIGEN, URINE: LEGIONELLA ANTIGEN, URINE: NEGATIVE

## 2013-06-25 LAB — LUPUS ANTICOAGULANT PANEL
DRVVT: 50.7 s — AB (ref <42.9)
Lupus Anticoagulant: DETECTED — AB
PTT LA: 49.2 s — AB (ref 28.0–43.0)
PTTLA 4:1 Mix: 47 secs — ABNORMAL HIGH (ref 28.0–43.0)
PTTLA Confirmation: 24.7 secs — ABNORMAL HIGH (ref <8.0)
dRVVT Incubated 1:1 Mix: 42.4 secs (ref <42.9)

## 2013-06-25 LAB — MAGNESIUM: MAGNESIUM: 2 mg/dL (ref 1.5–2.5)

## 2013-06-25 LAB — HEPATITIS B CORE ANTIBODY, IGM: Hep B C IgM: NONREACTIVE

## 2013-06-25 LAB — SJOGRENS SYNDROME-A EXTRACTABLE NUCLEAR ANTIBODY: SSA (Ro) (ENA) Antibody, IgG: <1

## 2013-06-25 LAB — HEPATITIS B SURFACE ANTIGEN: Hepatitis B Surface Ag: NEGATIVE

## 2013-06-25 LAB — ALDOLASE: Aldolase: 4 U/L (ref <=8.1)

## 2013-06-25 LAB — PHOSPHORUS: Phosphorus: 3 mg/dL (ref 2.3–4.6)

## 2013-06-25 MED ORDER — SODIUM CHLORIDE 0.9 % IJ SOLN
10.0000 mL | Freq: Two times a day (BID) | INTRAMUSCULAR | Status: DC
  Administered 2013-06-25 – 2013-07-01 (×11): 10 mL
  Administered 2013-07-02: 20 mL
  Administered 2013-07-02 – 2013-07-08 (×6): 10 mL
  Administered 2013-07-08: 20 mL
  Administered 2013-07-09 – 2013-07-13 (×7): 10 mL
  Administered 2013-07-13 – 2013-07-14 (×3): 20 mL
  Administered 2013-07-15: 10 mL
  Administered 2013-07-19 – 2013-07-20 (×2): 20 mL
  Administered 2013-07-20: 10 mL

## 2013-06-25 MED ORDER — SODIUM CHLORIDE 0.9 % IJ SOLN
10.0000 mL | INTRAMUSCULAR | Status: DC | PRN
  Administered 2013-07-13: 20 mL
  Administered 2013-07-20: 10 mL

## 2013-06-25 MED ORDER — FUROSEMIDE 10 MG/ML IJ SOLN: 20.0000 mg | Freq: Four times a day (QID) | INTRAMUSCULAR | Status: DC

## 2013-06-25 MED ORDER — ACETAMINOPHEN 325 MG PO TABS
650.0000 mg | ORAL_TABLET | Freq: Once | ORAL | Status: AC
  Administered 2013-06-25: 650 mg via ORAL
  Filled 2013-06-25: qty 2

## 2013-06-25 MED ORDER — DIPHENHYDRAMINE HCL 50 MG/ML IJ SOLN
50.0000 mg | Freq: Once | INTRAMUSCULAR | Status: AC
  Administered 2013-06-25: 50 mg via INTRAVENOUS
  Filled 2013-06-25: qty 1

## 2013-06-25 MED ORDER — SODIUM CHLORIDE 0.9 % IV SOLN
375.0000 mg/m2 | Freq: Once | INTRAVENOUS | Status: AC
  Administered 2013-06-25: 1000 mg via INTRAVENOUS
  Filled 2013-06-25: qty 100

## 2013-06-25 MED ORDER — SODIUM CHLORIDE 0.9 % IV SOLN
INTRAVENOUS | Status: DC
  Administered 2013-06-26 – 2013-06-30 (×2): via INTRAVENOUS
  Administered 2013-07-05 – 2013-07-06 (×2): 500 mL via INTRAVENOUS
  Administered 2013-07-16 – 2013-07-21 (×2): via INTRAVENOUS

## 2013-06-25 NOTE — Progress Notes (Signed)
PULMONARY / CRITICAL CARE MEDICINE   Name: [name] MRN: [id number] DOB: 1960-03-09    ADMISSION DATE:  06/23/2013 CONSULTATION DATE:  6/16  REFERRING MD : Doyle Askew  PRIMARY SERVICE: Hospitalist   CHIEF COMPLAINT: Shortness of breath, hemoptysis, diffuse alveolar hemorrhage   BRIEF PATIENT DESCRIPTION: 53 y.o. Female with h/o obesity, diverticulitis with sigmoid colon perforation, and GERD who presented to [facility] ED on 6/15 by EMS with c/o hemoptysis 2 days ago and cough for 2-3 days. Her O2 sats were in the 70's upon EMS arrival. She was weaned to 4L [place] in the ED. Subsequent CT chest in ED with severe bilateral groundglass airspace opacities suspicious for infection/hemorrhage and admitted to SDU. 6/16 PCCM consulted.   SIGNIFICANT EVENTS / STUDIES:  6/15 admission, CT chest with severe bilateral groundglass airspace opacities suspicious for infection/hemorrhage  CT Angio Chest 6/15: No evidence of pulmonary embolus. Severe bilateral ground-glass airspace opacities with relative sparing of the lung bases. This is likely scratch head this presumably represents diffuse infection/pneumonia or hemorrhage.  CXR 6/15: Diffuse and fairly fulminant bilateral airspace process unlikely noncardiogenic pulmonary edema and more likely infection or severe inflammation.  6/16 PCCM consulted  Echo 6/16: mild basal hypertrophy of septum, EF 55-60%, Right ventricle mildly dilated  6/17 worsening SOB, now on venti mask  LINES / TUBES:  PIV   CULTURES:  Blood cx x2 >>>  Resp virus panel 6/15 >>>  UA 6/15 - moderate leukocytes  And microscopic hematuria Urine leg 6/15 >>>  Re-order urine strep? 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  p -ANAC - 1:640  Atypical pANCA - negative  Myeloperoxidase Abs - >8.0  DNA - <1  RF <10  CCP <2.0  GBM - negative HIV - nonreactive   ANTIBIOTICS:  Levaquin 6/15 >>> 6/17 Ceftriaxone 6/15 >>> one  dose 6/15  Azithromycin 6/15 >>> one dose 6/15   SUBJECTIVE/OVER NIGHT EVENTS: Worsening shortness of breath overnight, now on venti mask  REVIEW OF SYSTEMS: Intermittent pleuritic chest pain - re-produced with palpation, worsening shortness of breath  VITAL SIGNS: Temp:  [97.5 F (36.4 C)-98.6 F (37 C)] 97.5 F (36.4 C) (06/17 0800) Pulse Rate:  [77-102] 79 (06/17 0600) Resp:  [18-40] 34 (06/17 0600) BP: (91-132)/(33-96) 125/65 mmHg (06/17 0600) SpO2:  [88 %-99 %] 88 % (06/17 0600) Weight:  [143.4 kg (316 lb 2.2 oz)] 143.4 kg (316 lb 2.2 oz) (06/17 0400) Venti Mask 50%   INTAKE / OUTPUT: Intake/Output     06/16 0701 - 06/17 0700 06/17 0701 - 06/18 0700   P.O.     Blood 100    IV Piggyback 366    Total Intake(mL/kg) 466 (3.2)    Urine (mL/kg/hr) 650 (0.2)    Stool 1 (0)    Total Output 651     Net -185           PHYSICAL EXAMINATION: General: acutely ill appearing obese female  Neuro: A/Ox4, MAEs  HEENT: venti mask  Cardiovascular: RRR, intermittent pleuritic chest pain reproduced with palpation Lungs: Right > Left crackles  Abdomen: Obese, soft, non-distended, non-tender Musculoskeletal: no cyanosis, no clubbing, no deformity  Skin: intact, no edema  LABS:  CBC  Recent Labs Lab 06/24/13 0304 06/24/13 1009 06/25/13 0318  WBC 15.1* 14.7* 12.9*  HGB 6.8* 7.4* 7.8*  HCT 22.2* 24.1* 25.3*  PLT 324 298 324   Coag's  Recent Labs Lab 06/23/13 2345  APTT 42*  INR 1.22   BMET  Recent Labs Lab 06/23/13 1713 06/24/13 0304 06/25/13 0318  NA 140 139 140  K 4.0 3.6* 4.0  CL 101 100 100  CO2 26 26 27   BUN 16 15 16   CREATININE 0.80 0.83 0.83  GLUCOSE 127* 94 157*   Electrolytes  Recent Labs Lab 06/23/13 1713 06/24/13 0304 06/25/13 0318  CALCIUM 9.2 8.7 9.0  MG  --  1.9 2.0  PHOS  --  3.0 3.0   Sepsis Markers  Recent Labs Lab 06/23/13 1904 06/23/13 2345 06/24/13 0304 06/25/13 0318  LATICACIDVEN 1.21 0.8  --   --   PROCALCITON  --   <0.10 <0.10 <0.10   ABG  Recent Labs Lab 06/23/13 2120  PHART 7.429  PCO2ART 38.6  PO2ART 62.4*   Liver Enzymes  Recent Labs Lab 06/24/13 0304  AST 10  ALT 10  ALKPHOS 98  BILITOT 0.4  ALBUMIN 2.8*   Cardiac Enzymes  Recent Labs Lab 06/23/13 1713 06/23/13 2345 06/24/13 0304  TROPONINI  --  <0.30 <0.30  PROBNP 582.7* 599.2* 522.3*   Glucose  Recent Labs Lab 06/24/13 1659 06/24/13 1926 06/24/13 2334 06/25/13 0324 06/25/13 0757  GLUCAP 174* 159* 191* 146* 148*    Imaging Ct Angio Chest Pe W/cm &/or Wo Cm  06/23/2013   CLINICAL DATA:  Shortness of breath.  Hemoptysis.  EXAM: CT ANGIOGRAPHY CHEST WITH CONTRAST  TECHNIQUE: Multidetector CT imaging of the chest was performed using the standard protocol during bolus administration of intravenous contrast. Multiplanar CT image reconstructions and MIPs were obtained to evaluate the vascular anatomy.  CONTRAST:  148mL OMNIPAQUE IOHEXOL 350 MG/ML SOLN  COMPARISON:  Chest x-ray earlier today.  FINDINGS: No filling defects in the pulmonary arteries to suggest pulmonary emboli. Heart is normal size. Aorta is normal caliber.  Severe bilateral ground-glass airspace opacities with relative sparing of the lung bases. This presumably represents infection or hemorrhage. No pleural effusions. Scattered small mediastinal lymph nodes, none pathologically enlarged. No hilar or axillary adenopathy.  Chest wall soft tissues are unremarkable. Imaging into the upper abdomen shows no acute findings.  Review of the MIP images confirms the above findings.  IMPRESSION: No evidence of pulmonary embolus.  Severe bilateral ground-glass airspace opacities with relative sparing of the lung bases. This is likely scratch head this presumably represents diffuse infection/pneumonia or hemorrhage.   Electronically Signed   By: Rolm Baptise M.D.   On: 06/23/2013 19:05   Dg Chest Port 1 View  06/23/2013   CLINICAL DATA:  Shortness of breath, cough and  hemoptysis.  EXAM: PORTABLE CHEST - 1 VIEW  COMPARISON:  09/23/2012  FINDINGS: The cardiac silhouette, mediastinal and hilar contours are within normal limits and stable. There is a diffuse airspace process. This could be diffuse infection or inflammation. Noncardiogenic pulmonary edema is also possible but less likely. No pleural effusion. The bony thorax is intact.  IMPRESSION: Diffuse and fairly fulminant bilateral airspace process unlikely noncardiogenic pulmonary edema and more likely infection or severe inflammation.   Electronically Signed   By: Kalman Jewels M.D.   On: 06/23/2013 17:28    ASSESSMENT / PLAN:  PULMONARY  A:Acute Hypoxemic Respiratory Failure due to Diffuse Alveolar Hemorrhage due to MICROSCOPIC POLYANGITIS Desaturation over night requiring NRB-Venti mask   P:  Continue aggressive steroids (Solumedrol 500mg   Q6h IV) since 06/24/13 x 5 days  START RITUXAN 37mg /m2 Q week x 4 weeks (await Hep B panel)  but given low risk and normal LFT and urgency of situation go ahead  - hydration with rituxan, dc lasix Check G6PD and if negative, start Bactrim 1DS M, W, F schedule for PCP prophylaxis CONSIDER PLEX if worsens Might need surgical lung bx if autoimmune negative v empiric Rx  bipap if worse -> intubate if even worse   CARDIOVASCULAR  A:  No evidence of CHF  Echo 6/16: mild basal hypertrophy of septum, EF 55-60%, Right ventricle mildly dilated  BNP & CEs wnls  P:  Cont tele monitoring  Even to neg fluid balance   RENAL  A:  No evidence of pulmonary - renal syndrome  UA - moderate leukocytes  P:  F/u BMP Monitor   GASTROINTESTINAL  A:  H/o GERD - epigastric pain  Nausea - improved P:  Ct PPI  Ct clear liquid diet  Ct Reglan   HEMATOLOGIC  A:  Anemia associated with alveolar hemorrhage - stable Received 1 unit PRBC for hgb < 7gm% - repeat CBC improved  P:  F/u CBC    INFECTIOUS  A:  Possible pneumonia on Chest CT 6/15 - more likely alveolar  hemorrhage  PCT- WNL  lactic - WNL  P:  D/c levaquin if no clinical indication of infection   ENDOCRINE  A:  Steroid induced hyperglycemia  P:  SSI-->High levels of steroids for alveolar hemorrhage   NEUROLOGIC  A:  intact  P:  monitor    TODAY'S SUMMARY: Patient with worsening shortness of breath this AM on venti mask 50%. START  RITUXAN    Lalla Brothers NP student [place] NP  Pulmonary and [place] Pager: [phone]  06/25/2013, 10:01 AM   STAFF NOTE: I, Dr Ann Lions have personally reviewed patient's available data, including medical history, events of note, physical examination and test results as part of my evaluation. I have discussed with resident/NP and other care providers such as pharmacist, RN and RRT.  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 She is worse. Will start Induction Rituxan. I extensively updated her and her sister at bedside about dx, prognosis, seveity and Rx decisions. I counseled on side effects of all drugs including life threatening allergic reactions,liver failure. She has agreed to proceed. Place PICC line  Rest per NP/medical resident whose note is outlined above and that I agree with  The patient is critically ill with multiple organ systems failure and requires high complexity decision making for assessment and support, frequent evaluation and titration of therapies, application of advanced monitoring technologies and extensive interpretation of multiple databases.   Critical Care Time devoted to patient care services described in this note is  60  Minutes.  Dr. Brand Males, M.D., [facility].C.P Pulmonary and Critical Care Medicine Staff Physician [place] [facility] Pager: [phone], If no answer or between  15:00h - 7:00h: call [phone]   [phone]  06/25/2013 11:24 AM

## 2013-06-25 NOTE — Progress Notes (Signed)
Peripherally Inserted Central Catheter/Midline Placement  The IV Nurse has discussed with the patient and/or persons authorized to consent for the patient, the purpose of this procedure and the potential benefits and risks involved with this procedure.  The benefits include less needle sticks, lab draws from the catheter and patient may be discharged home with the catheter.  Risks include, but not limited to, infection, bleeding, blood clot (thrombus formation), and puncture of an artery; nerve damage and irregular heat beat.  Alternatives to this procedure were also discussed.  PICC/Midline Placement Documentation        [name] 06/25/2013, 2:30 PM Consent obtained by Jacobo Forest, RN, CRNI

## 2013-06-25 NOTE — Progress Notes (Signed)
Patient ID: [name], female   DOB: 1960/09/30, 53 y.o.   MRN: [id number] [facility] PROGRESS NOTE  [name] [id number] DOB: 1960-02-29 DOA: 06/23/2013 PCP: [name], MD  Brief narrative: 53 y.o. female with h/o obesity, diverticulitis with sigmoid colon perforation, and GERD who presented to [facility] ED on 6/15 by EMS with c/o hemoptysis that started 2 days prior to this admission, associated with cough. Her O2 sats were in the 70's upon EMS arrival. She was weaned to 4L [place] in the ED. Subsequent CT chest in ED with severe bilateral groundglass airspace opacities suspicious for infection/hemorrhage and admitted to SDU. 6/16 PCCM consulted.   Principal Problem:  Acute Hypoxemic Respiratory Failure due to Diffuse Alveolar Hemorrhage.  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  Steroid induced hyperglycemia  - SSI-->High levels of steroids for alveolar hemorrhage    Consultants:  PCCM   Procedures/Studies: CT Angio Chest 06/23/2013  No evidence of pulmonary embolus.  Severe bilateral ground-glass airspace opacities with relative sparing of the lung bases. This is likely scratch head this presumably represents diffuse infection/pneumonia or hemorrhage.    CXR 06/23/2013  Diffuse and fairly fulminant bilateral  airspace process unlikely noncardiogenic pulmonary edema and more likely infection or severe inflammation.   ECHO 6/16: mild basal hypertrophy of septum, EF 55-60%, Right ventricle mildly dilated    Lines Irven Coe:   PIV  Cultures:   Blood cx x2 >>>   Resp virus panel 6/15 >>>   UA 6/15 - moderate leukocytes   Urine leg 6/15 >>>   Re-order urine strep? 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  Objective: Filed Vitals:   06/25/13 1000 06/25/13 1100 06/25/13 1200 06/25/13 1300  BP: 93/56 106/54    Pulse: 85 97 93 90  Temp:   97.3 F (36.3 C)   TempSrc:   Axillary   Resp: 26 23 26  44  Height:      Weight:      SpO2: 86% 97% 95% 95%    Intake/Output Summary (Last 24 hours) at 06/25/13 1515 Last data filed at  06/25/13 1300  Gross per 24 hour  Intake  478.5 ml  Output    351 ml  Net  127.5 ml    Exam:  Physical Exam: Head: Normocephalic, atraumatic.  Eyes: No signs of jaundice, EOMI Nose: Mucous membranes dry.  Throat: Oropharynx nonerythematous, no exudate appreciated.  Neck: supple,No deformities, masses, or tenderness noted. Lungs: decreased breath sounds bilaterally Heart: Regular RR. S1 and S2 normal  Abdomen: BS normoactive. Soft, Nondistended, non-tender.  Extremities: No pretibial edema, no erythema   Data Reviewed: Basic Metabolic Panel:  Recent Labs Lab 06/23/13 1713 06/24/13 0304 06/25/13 0318 06/25/13 1045  NA 140 139 140 139  K 4.0 3.6* 4.0 3.6*  CL 101 100 100 98   CO2 26 26 27 26   GLUCOSE 127* 94 157* 188*  BUN 16 15 16 20   CREATININE 0.80 0.83 0.83 0.82  CALCIUM 9.2 8.7 9.0 9.2  MG  --  1.9 2.0  --   PHOS  --  3.0 3.0  --    Liver Function Tests:  Recent Labs Lab 06/24/13 0304  AST 10  ALT 10  ALKPHOS 98  BILITOT 0.4  PROT 6.7  ALBUMIN 2.8*   CBC:  Recent Labs Lab 06/23/13 1713 06/24/13 0304 06/24/13 1009 06/25/13 0318  WBC 16.0* 15.1* 14.7* 12.9*  NEUTROABS 14.2*  --   --   --   HGB 8.1* 6.8* 7.4* 7.8*  HCT 26.1* 22.2* 24.1* 25.3*  MCV 77.4* 78.4 79.5 79.1  PLT 344 324 298 324   Cardiac Enzymes:  Recent Labs Lab 06/23/13 2345 06/24/13 0304  CKTOTAL 31  --   CKMB 2.2  --   TROPONINI <0.30 <0.30    Recent Results (from the past 240 hour(s))  CULTURE, BLOOD (ROUTINE X 2)     Status: None   Collection Time    06/23/13  6:41 PM      Result Value Ref Range Status   Specimen Description BLOOD LEFT ANTECUBITAL   Final   Special Requests BOTTLES DRAWN AEROBIC AND ANAEROBIC 3ML   Final   Culture  Setup Time     Final   Value: 06/23/2013 23:34     Performed at [facility]   Culture     Final   Value:        BLOOD CULTURE RECEIVED NO GROWTH TO DATE CULTURE WILL BE HELD FOR 5 DAYS BEFORE ISSUING A FINAL NEGATIVE REPORT     Performed at [facility]   Report Status PENDING   Incomplete  CULTURE, BLOOD (ROUTINE X 2)     Status: None   Collection Time    06/23/13  6:41 PM      Result Value Ref Range Status   Specimen Description BLOOD LEFT HAND   Final   Special Requests BOTTLES DRAWN AEROBIC AND ANAEROBIC 3ML   Final   Culture  Setup Time     Final   Value: 06/23/2013 23:34     Performed at [facility]   Culture     Final   Value:        BLOOD CULTURE RECEIVED NO GROWTH TO DATE CULTURE WILL BE HELD FOR 5 DAYS BEFORE ISSUING A FINAL NEGATIVE REPORT     Performed at [facility]   Report Status PENDING   Incomplete  MRSA PCR SCREENING     Status: None   Collection Time    06/23/13  10:23 PM      Result Value Ref Range  Status   MRSA by PCR NEGATIVE  NEGATIVE Final   Comment:            The GeneXpert MRSA Assay (FDA     approved for NASAL specimens     only), is one component of a     comprehensive MRSA colonization     surveillance program. It is not     intended to diagnose MRSA     infection nor to guide or     monitor treatment for     MRSA infections.     Scheduled Meds: . acetaminophen  650 mg Oral Once  . buPROPion  300 mg Oral Daily  . citalopram  40 mg Oral Daily  . diphenhydrAMINE  50 mg Intravenous Once  . insulin aspart  2-6 Units Subcutaneous 6 times per day  . methylPREDNISolone (SOLU-MEDROL) injection  500 mg Intravenous 4 times per day  . pantoprazole  40 mg Oral Daily  . riTUXimab (RITUXAN) IV infusion  375 mg/m2 Intravenous Once  . sodium chloride  10-40 mL Intracatheter Q12H   Continuous Infusions: . sodium chloride 75 mL/hr at 06/25/13 1130     LAMA,GAGAN S, MD  [facility] Pager [phone]  If 7PM-7AM, please contact night-coverage [email or web address] Password [email or web address] 06/25/2013, 3:15 PM   LOS: 2 days

## 2013-06-25 NOTE — Clinical Documentation Improvement (Signed)
06/25/13  1.  Please clarify anemia. Thank you. Possible Clinical Conditions?    Expected Acute Blood Loss Anemia  Acute Blood Loss Anemia  Acute on chronic blood loss anemia  Chronic blood loss anemia  Precipitous drop in Hematocrit  Other Condition________________  Cannot Clinically Determine    Supporting Information: Risk Factors:  Diffuse pulmonary alveolar hemorrhage  Signs and Symptoms  Shortness of breath  Diagnostics: H&H  6/15 = 8.1/26.1 6/16 @0304  = 6.8/22.2 6/16@ 1009 = 7.4/24.1 6/17 = 7.8/25.3  Treatments: Transfusion: 2 units PRBC H&H monitoring  2. Please clarify obesity. Thank you. Possible Clinical conditions  Morbid Obesity W/ BMI  46.34 Other condition___________________ Cannot clinically determine _____________  Risk Factors: History of Obesity (BMI 30-39)  Sign & Symptoms: Height:  5'9"   Weight:  316 lbs   BMI:  46.34  Thank You, Estella Husk ,RN Clinical Documentation Specialist:  [place] Information Management

## 2013-06-26 ENCOUNTER — Inpatient Hospital Stay (HOSPITAL_COMMUNITY): Payer: 59

## 2013-06-26 DIAGNOSIS — R042 Hemoptysis: Secondary | ICD-10-CM

## 2013-06-26 DIAGNOSIS — M300 Polyarteritis nodosa: Secondary | ICD-10-CM

## 2013-06-26 LAB — COMPREHENSIVE METABOLIC PANEL
ALT: 30 U/L (ref 0–35)
AST: 31 U/L (ref 0–37)
Albumin: 2.8 g/dL — ABNORMAL LOW (ref 3.5–5.2)
Alkaline Phosphatase: 126 U/L — ABNORMAL HIGH (ref 39–117)
BUN: 27 mg/dL — AB (ref 6–23)
CO2: 26 meq/L (ref 19–32)
CREATININE: 0.82 mg/dL (ref 0.50–1.10)
Calcium: 8.7 mg/dL (ref 8.4–10.5)
Chloride: 103 mEq/L (ref 96–112)
GFR, EST NON AFRICAN AMERICAN: 81 mL/min — AB (ref >90)
Glucose, Bld: 141 mg/dL — ABNORMAL HIGH (ref 70–99)
Potassium: 4.1 mEq/L (ref 3.7–5.3)
Sodium: 141 mEq/L (ref 137–147)
Total Bilirubin: 0.6 mg/dL (ref 0.3–1.2)
Total Protein: 7.1 g/dL (ref 6.0–8.3)

## 2013-06-26 LAB — GLUCOSE, CAPILLARY
GLUCOSE-CAPILLARY: 159 mg/dL — AB (ref 70–99)
Glucose-Capillary: 161 mg/dL — ABNORMAL HIGH (ref 70–99)
Glucose-Capillary: 155 mg/dL — ABNORMAL HIGH (ref 70–99)
Glucose-Capillary: 248 mg/dL — ABNORMAL HIGH (ref 70–99)
Glucose-Capillary: 172 mg/dL — ABNORMAL HIGH (ref 70–99)

## 2013-06-26 LAB — CBC
HCT: 23.6 % — ABNORMAL LOW (ref 36.0–46.0)
Hemoglobin: 7.2 g/dL — ABNORMAL LOW (ref 12.0–15.0)
MCH: 24.1 pg — ABNORMAL LOW (ref 26.0–34.0)
MCHC: 30.5 g/dL (ref 30.0–36.0)
MCV: 78.9 fL (ref 78.0–100.0)
PLATELETS: 362 10*3/uL (ref 150–400)
RBC: 2.99 MIL/uL — ABNORMAL LOW (ref 3.87–5.11)
RDW: 14.4 % (ref 11.5–15.5)
WBC: 20.7 10*3/uL — AB (ref 4.0–10.5)

## 2013-06-26 LAB — LACTATE DEHYDROGENASE: LDH: 425 U/L — ABNORMAL HIGH (ref 94–250)

## 2013-06-26 LAB — PHOSPHORUS: PHOSPHORUS: 2.7 mg/dL (ref 2.3–4.6)

## 2013-06-26 LAB — CARDIOLIPIN ANTIBODY: Phospholipids: 131 mg/dL — ABNORMAL LOW (ref 151–264)

## 2013-06-26 LAB — MAGNESIUM: MAGNESIUM: 2.2 mg/dL (ref 1.5–2.5)

## 2013-06-26 MED ORDER — PANTOPRAZOLE SODIUM 40 MG PO TBEC
40.0000 mg | DELAYED_RELEASE_TABLET | Freq: Every day | ORAL | Status: DC
  Administered 2013-06-26 – 2013-07-21 (×26): 40 mg via ORAL
  Filled 2013-06-26 (×26): qty 1

## 2013-06-26 NOTE — Progress Notes (Signed)
Patient ID: [name], female   DOB: 20-Apr-1960, 53 y.o.   MRN: [id number] [facility] PROGRESS NOTE  [name] [name] DOB: Jan 01, 1961 DOA: 06/23/2013 PCP: [name], MD  Brief narrative: 53 y.o. female with h/o obesity, diverticulitis with sigmoid colon perforation, and GERD who presented to [facility] ED on 6/15 by EMS with c/o hemoptysis that started 2 days prior to this admission, associated with cough. Her O2 sats were in the 70's upon EMS arrival. She was weaned to 4L [place] in the ED. Subsequent CT chest in ED with severe bilateral groundglass airspace opacities suspicious for infection/hemorrhage and admitted to SDU. 6/16 PCCM consulted.   Principal Problem:  Acute Hypoxemic Respiratory Failure due to Diffuse Alveolar Hemorrhage.  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 - appreciate PCCM input  - no evidence of CHF on 2 D ECHO, EF 55 - 60 % - Cont tele monitoring    GERD - continue PPI  - Start clear liquid diet  - Start Reglan   Anemia associated with alveolar hemorrhage  - Received 1 unit PRBC for hgb < 7gm% - repeat CBC improved  - Hb is 7.2 today, will again transfuse if hb falls less than 7  Steroid induced hyperglycemia  - SSI-->High levels of steroids for alveolar hemorrhage  Leukocytosis - secondary to steroids.  Consultants:  PCCM  Procedures/Studies: CT Angio Chest 06/23/2013  No evidence of pulmonary embolus.  Severe bilateral ground-glass airspace opacities with relative sparing of the lung bases. This is likely scratch head this presumably represents  diffuse infection/pneumonia or hemorrhage.    CXR 06/23/2013  Diffuse and fairly fulminant bilateral airspace process unlikely noncardiogenic pulmonary edema and more likely infection or severe inflammation.   ECHO 6/16: mild basal hypertrophy of septum, EF 55-60%, Right ventricle mildly dilated   Lines /TIrven Coe  PIV  Cultures:   Blood cx x2 >>> NTD  Resp virus panel 6/15 >>> negative  UA 6/15 - moderate leukocytes   Urine leg 6/15 >>> negative  Re-order urine strep? 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  Objective: Filed Vitals:   06/26/13 0730 06/26/13 0800 06/26/13 0813 06/26/13 0900  BP:  119/64  130/80  Pulse: 81 95 89 86  Temp:  98 F (36.7 C)    TempSrc:  Oral    Resp: 23 19 34 24  Height:      Weight:      SpO2: 88% 81%  93% 93%    Intake/Output Summary (Last 24 hours) at 06/26/13 0954 Last data filed at 06/26/13 0900  Gross per 24 hour  Intake   1981 ml  Output    650 ml  Net   1331 ml    Exam:  Physical Exam: Head: Normocephalic, atraumatic.  Eyes: No signs of jaundice, EOMI Nose: Mucous membranes dry.  Throat: Oropharynx nonerythematous, no exudate appreciated.  Neck: supple,No deformities, masses, or tenderness noted. Lungs: Clear to auscultation bilaterally Heart: Regular RR. S1 and S2 normal  Abdomen: BS normoactive. Soft, Nondistended, non-tender.  Extremities: No pretibial edema, no erythema   Data Reviewed: Basic Metabolic  Panel:  Recent Labs Lab 06/23/13 1713 06/24/13 0304 06/25/13 0318 06/25/13 1045 06/26/13 0520  NA 140 139 140 139  --   K 4.0 3.6* 4.0 3.6*  --   CL 101 100 100 98  --   CO2 _0 --   GLUCOSE 127* 94 157* 188*  --   BUN _1 --   CREATININE 0.80 0.83 0.83 0.82  --   CALCIUM 9.2 8.7 9.0 9.2  --   MG  --  1.9 2.0  --  2.2  PHOS  --  3.0 3.0  --  2.7   Liver Function Tests:  Recent Labs Lab 06/24/13 0304  AST 10  ALT 10  ALKPHOS 98  BILITOT 0.4  PROT 6.7  ALBUMIN 2.8*   CBC:  Recent Labs Lab 06/23/13 1713 06/24/13 0304 06/24/13 1009 06/25/13 0318 06/26/13 0520  WBC 16.0* 15.1* 14.7* 12.9* 20.7*  NEUTROABS 14.2*  --   --   --   --   HGB 8.1* 6.8* 7.4* 7.8* 7.2*  HCT 26.1* 22.2* 24.1* 25.3* 23.6*  MCV 77.4* 78.4 79.5 79.1 78.9  PLT 344 324 298 324 362   Cardiac Enzymes:  Recent Labs Lab 06/23/13 2345 06/24/13 0304  CKTOTAL 31  --   CKMB 2.2  --   TROPONINI <0.30 <0.30    Recent Results (from the past 240 hour(s))  CULTURE, BLOOD (ROUTINE X 2)     Status: None   Collection Time    06/23/13  6:41 PM      Result Value Ref Range Status   Specimen Description BLOOD LEFT ANTECUBITAL   Final   Special Requests BOTTLES DRAWN AEROBIC AND ANAEROBIC 3ML   Final   Culture  Setup Time     Final   Value: 06/23/2013 23:34     Performed at [facility]   Culture     Final   Value:        BLOOD CULTURE RECEIVED NO GROWTH TO DATE CULTURE WILL BE HELD FOR 5 DAYS BEFORE ISSUING A FINAL NEGATIVE REPORT     Performed at [facility]   Report Status PENDING   Incomplete  CULTURE, BLOOD (ROUTINE X 2)     Status: None   Collection Time    06/23/13  6:41 PM      Result Value Ref Range Status   Specimen Description BLOOD LEFT HAND   Final   Special Requests BOTTLES DRAWN AEROBIC AND ANAEROBIC 3ML   Final   Culture  Setup Time     Final   Value: 06/23/2013 23:34     Performed at [facility]   Culture     Final   Value:         BLOOD CULTURE RECEIVED NO GROWTH TO DATE CULTURE WILL  BE HELD FOR 5 DAYS BEFORE ISSUING A FINAL NEGATIVE REPORT     Performed at [facility]   Report Status PENDING   Incomplete  MRSA PCR SCREENING     Status: None   Collection Time    06/23/13 10:23 PM      Result Value Ref Range Status   MRSA by PCR NEGATIVE  NEGATIVE Final   Comment:            The GeneXpert MRSA Assay (FDA     approved for NASAL specimens     only), is one component of a     comprehensive MRSA colonization     surveillance program. It is not     intended to diagnose MRSA     infection nor to guide or     monitor treatment for     MRSA infections.  RESPIRATORY VIRUS PANEL     Status: None   Collection Time    06/23/13 10:51 PM      Result Value Ref Range Status   Source - RVPAN NOSE   Final   Respiratory Syncytial Virus A NOT DETECTED   Final   Respiratory Syncytial Virus B NOT DETECTED   Final   Influenza A NOT DETECTED   Final   Influenza B NOT DETECTED   Final   Parainfluenza 1 NOT DETECTED   Final   Parainfluenza 2 NOT DETECTED   Final   Parainfluenza 3 NOT DETECTED   Final   Metapneumovirus NOT DETECTED   Final   Rhinovirus NOT DETECTED   Final   Adenovirus NOT DETECTED   Final   Influenza A H1 NOT DETECTED   Final   Influenza A H3 NOT DETECTED   Final   Comment: (NOTE)           Normal Reference Range for each Analyte: NOT DETECTED     Testing performed using the Luminex xTAG Respiratory Viral Panel test     kit.     This test was developed and its performance characteristics determined     by [facility]. It has not been cleared or approved by the [place]     Food and Drug Administration. This test is used for clinical purposes.     It should not be regarded as investigational or for research. This     laboratory is certified under the [place] (CLIA) as qualified to perform high complexity     clinical laboratory testing.     Performed  at [facility]     Scheduled Meds: . buPROPion  300 mg Oral Daily  . citalopram  40 mg Oral Daily  . insulin aspart  2-6 Units Subcutaneous 6 times per day  . methylPREDNISolone (SOLU-MEDROL) injection  500 mg Intravenous 4 times per day  . pantoprazole  40 mg Oral Daily  . sodium chloride  10-40 mL Intracatheter Q12H   Continuous Infusions: . sodium chloride 75 mL/hr at 06/25/13 1130     LAMA,GAGAN S, MD  [facility] Pager [phone]  If 7PM-7AM, please contact night-coverage [email or web address] Password [email or web address] 06/26/2013, 9:54 AM   LOS: 3 days

## 2013-06-26 NOTE — Progress Notes (Addendum)
PULMONARY / CRITICAL CARE MEDICINE   Name: [name] MRN: [id number] DOB: Jul 31, 1960    ADMISSION DATE:  06/23/2013 CONSULTATION DATE:  6/16  REFERRING MD : Doyle Askew  PRIMARY SERVICE: Hospitalist   CHIEF COMPLAINT: Shortness of breath, hemoptysis, diffuse alveolar hemorrhage   BRIEF PATIENT DESCRIPTION: 53 y.o. Female with h/o obesity, diverticulitis with sigmoid colon perforation, and GERD who presented to [facility] ED on 6/15 by EMS with c/o hemoptysis 2 days ago and cough for 2-3 days. Her O2 sats were in the 70's upon EMS arrival. She was weaned to 4L [place] in the ED. Subsequent CT chest in ED with severe bilateral groundglass airspace opacities suspicious for infection/hemorrhage and admitted to SDU. 6/16 PCCM consulted.    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 Severe bilateral ground-glass airspace opacities with relative sparing of the lung bases. This is likely scratch head this presumably represents diffuse infection/pneumonia or hemorrhage.  CXR 6/15: Diffuse and fairly fulminant bilateral airspace process unlikely noncardiogenic pulmonary edema and more likely infection or severe inflammation.  6/16 PCCM  consulted . START HIGH DOSE IV SOLUMEDROL x 5 days Echo 6/16: mild basal hypertrophy of septum, EF 55-60%, Right ventricle mildly dilated  6/17 worsening SOB, now on venti mask. START ONCE A WEEK RITUXAN    SUBJECTIVE/OVER NIGHT EVENTS: Worsening shortness of breath overnight, now on NRB,did well with Rituxan drip.   REVIEW OF SYSTEMS: Worsening SOB also possible oligura (staff MDnote)  VITAL SIGNS: Temp:  [97.3 F (36.3 C)-98.8 F (37.1 C)] 98.4 F (36.9 C) (06/18 0400) Pulse Rate:  [72-102] 80 (06/18 0700) Resp:  [16-44] 34 (06/18 0700) BP: (93-137)/(47-77) 132/73 mmHg (06/18 0700) SpO2:  [80 %-99 %] 92 % (06/18 0600) FiO2 (%):  [100 %] 100 % (06/17 0800) Venti Mask 50% Vent Mode:  [-]  FiO2 (%):  [100 %] 100 % INTAKE / OUTPUT: Intake/Output     06/17 0701 - 06/18 0700 06/18 0701 - 06/19 0700   I.V. (mL/kg) 1237.5 (8.6)    Blood     Other 250    IV Piggyback 268.5    Total Intake(mL/kg) 1756 (12.2)    Urine (mL/kg/hr) 650 (0.2)    Stool     Total Output 650     Net +1106          Urine Occurrence 1 x    Stool Occurrence 1 x     PHYSICAL EXAMINATION: General: acutely ill appearing obese female  Neuro: A/Ox4, MAEs  HEENT: NRB Cardiovascular: RRR Lungs: Right > Left crackles  Abdomen: Obese, soft, non-distended, non-tender Musculoskeletal: no cyanosis, no clubbing, no deformity  Skin: intact,  no edema  LABS:  CBC  Recent Labs Lab 06/24/13 1009 06/25/13 0318 06/26/13 0520  WBC 14.7* 12.9* 20.7*  HGB 7.4* 7.8* 7.2*  HCT 24.1* 25.3* 23.6*  PLT 298 324 362   Coag's  Recent Labs Lab 06/23/13 2345  APTT 42*  INR 1.22   BMET  Recent Labs Lab 06/24/13 0304 06/25/13 0318 06/25/13 1045  NA 139 140 139  K 3.6* 4.0 3.6*  CL 100 100 98  CO2 26 27 26   BUN 15 16 20   CREATININE 0.83 0.83 0.82  GLUCOSE 94 157* 188*   Electrolytes  Recent Labs Lab 06/24/13 0304 06/25/13 0318 06/25/13 1045 06/26/13 0520  CALCIUM 8.7 9.0 9.2  --   MG 1.9 2.0   --  2.2  PHOS 3.0 3.0  --  2.7   Sepsis Markers  Recent Labs Lab 06/23/13 1904 06/23/13 2345 06/24/13 0304 06/25/13 0318  LATICACIDVEN 1.21 0.8  --   --   PROCALCITON  --  <0.10 <0.10 <0.10   ABG  Recent Labs Lab 06/23/13 2120  PHART 7.429  PCO2ART 38.6  PO2ART 62.4*   Liver Enzymes  Recent Labs Lab 06/24/13 0304  AST 10  ALT 10  ALKPHOS 98  BILITOT 0.4  ALBUMIN 2.8*   Cardiac Enzymes  Recent Labs Lab 06/23/13 1713 06/23/13 2345 06/24/13 0304  TROPONINI  --  <0.30 <0.30  PROBNP 582.7* 599.2* 522.3*   Glucose  Recent Labs Lab 06/25/13 0757 06/25/13 1124 06/25/13 1631 06/25/13 1959 06/25/13 2319 06/26/13 0417  GLUCAP 148* 225* 131* 179* 223* 161*    Imaging Dg Chest Port 1 View  06/25/2013   CLINICAL DATA:  Respiratory distress  EXAM: PORTABLE CHEST - 1 VIEW  COMPARISON:  Portable chest x-ray of May 23, 2013 and chest CT scan of the same day.  FINDINGS: Widespread pulmonary parenchymal opacities are again demonstrated. The age of greater on the right than on the left. Minimal sparing of the periphery of the left lung is again demonstrated. The right heart border is obscured. The left heart border is normal. The pulmonary vascularity is obscured.  IMPRESSION: Widespread alveolar opacities are more conspicuous today. The findings are consistent with pneumonia or pulmonary hemorrhage or other alveolar filling process.   Electronically Signed   By: David  Martinique   On: 06/25/2013 10:55    ASSESSMENT / PLAN:  PULMONARY  A:Acute Hypoxemic Respiratory Failure due to Diffuse Alveolar Hemorrhage due to MICROSCOPIC POLYANGITIS Desaturation over night requiring NRB-Venti mask   P:  Continue aggressive steroids (Solumedrol 500mg   Q6h IV) since 06/24/13 x 5 days  START RITUXAN 37mg /m2 Q week x 4 weeks; first dose given 06/25/13, next 07/02/13  - hydration with rituxan F/u G6PD 6/17 and if negative, start Bactrim 1DS M, W, F schedule for PCP prophylaxis CONSIDER  PLEX if worsens Might need surgical lung bx if autoimmune negative v empiric Rx  bipap if worse -> intubate if even worse   CARDIOVASCULAR  A:  No evidence of CHF  Echo 6/16: mild basal hypertrophy of septum, EF 55-60%, Right ventricle mildly dilated  P:  Cont tele monitoring  Even to neg fluid balance   RENAL  A:  No evidence of pulmonary - renal syndrome but possible oliguria 06/26/13 UA - moderate leukocytes, mild hematuria P:  F/u CMP Monitor  RENAL CONSULT FOR POSSIBLE PLEX INSERT FOLEY  GASTROINTESTINAL  A:  H/o GERD - epigastric pain . Hep B panel negative (Rituxan risk factor) P:  Ct PPI  Ct clear liquid diet - may need to be NPO for bipap Ct Reglan   HEMATOLOGIC  A:  Anemia associated with alveolar hemorrhage - stable Received 1 unit PRBC for hgb < 7gm% - repeat CBC improved  P:  F/u CBC    INFECTIOUS  A:  Possible pneumonia on Chest CT 6/15 - more likely alveolar hemorrhage  Leukocytosis - no fevers or increased sputum production PCT- WNL  lactic - WNL  P:  F/u CBC D/c levaquin   ENDOCRINE  A:  Steroid induced hyperglycemia  P:  SSI-->High levels of steroids for alveolar hemorrhage   NEUROLOGIC  A:  intact  P:  monitor    TODAY'S SUMMARY: Patient with worsening shortness of breath this AM on NRB, may need BiPAP today.      Lalla Brothers NP student [place] NP  Pulmonary and [place] Pager: [phone]  06/26/2013, 7:54 AM   STAFF NOTE: I, Dr Ann Lions have personally reviewed patient's available data, including medical history, events of note, physical examination and test results as part of my evaluation. I have discussed with resident/NP and other care providers such as pharmacist, RN and RRT.  In addition, acute resp failure is unchanged/worse (crackles less, hgb holding) but easy desaturations persist despite d2 steroid and 1d post rituxan. Very concerning also is possible oliguria. Will  check BMET and place foley She might need PLEX if unimprovd over next 24h; could potentially help avoid intubation. Will call renal for consult. I have updated her and her sister in detail. Her course is worrisome. PCCM can be primary if ok with [facility]. Marland Kitchen   Rest per NP/medical resident whose note is outlined above and that I agree with  The patient is critically ill with multiple organ systems failure and requires high complexity decision making for assessment and support, frequent evaluation and titration of therapies, application of advanced monitoring technologies and extensive interpretation of multiple databases.   Critical Care Time devoted to patient care services described in this note is  35  Minutes.  Dr. Brand Males, M.D., [facility].C.P Pulmonary and Critical Care Medicine Staff Physician [place] [facility] Pager: [phone], If no answer or between  15:00h - 7:00h: call [phone]  06/26/2013 7:54 AM

## 2013-06-26 NOTE — Progress Notes (Signed)
Chaplain referral to see.  Patient was extremely anxious.  When I arrived, she was calmer, but her concerns are legion.  She is concerned about what she says is auto-immune.  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  She is a "high-strung person" a sis.  She also voiced concern over understanding the language of the medical community and her doctor.  She is apologetic for any trouble she has caused.  States when she came in her oxygen level was about 60.  It was in the 90's when we spoke.  She voiced concern over why God is allowing this to happen and stated she has been angry with God.  Chaplain told her that such is OK; God can handle it.  Anger with God is OK, especially since God doesn't let us in all the time on what is happening.  Attempted to impress upon her that she is not her job or bills, and that she is important.  Also talked to her that if God took care of her when (referring to her prior trials) God will continue to do so.  I suggest continuing in these two directions, always acknowledging her frustrations.  She will not admit to being afraid outright, butit can be seen in what she says, how she says it and what she describes.  Told her I am available through the night.  Loann Quill, Chaplain Pager: [phone]

## 2013-06-26 NOTE — Plan of Care (Signed)
Problem: Phase I Progression Outcomes Goal: Progress activity as tolerated unless otherwise ordered Outcome: Not Progressing Activity intolerance due to respiratory distress. Goal: Tolerating diet Outcome: Not Progressing NPO except ice chips due to acute respiratory distress.

## 2013-06-26 NOTE — Consult Note (Deleted)
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 She says that she's been having chronic  dry cough for the past 2-3 months and dyspnea on exertion. Hemoptysis started on 06/22/2013. This the first time hemoptysis ever happened to her. She was positive for ANCA vasculitis. Her chest x-ray on admission revealed diffuse and fairly fulminant bilateral airspace process. CT scan of the chest revealed severe bilateral ground-glass airspace opacities with relative  sparing of the lung bases likely represents diffuse infection/pneumonia or hemorrhage.   She was started on high-dose steroids on 06/24/2013 and first dose of Rituxan was initiated yesterday. During the hospital course she was transfused with 1 unit of packed RBCs and her hemoglobin and hematocrit is stable at this time. Her chest x-ray stable. Her oxygen requirement increased. patient says that when compared to this morning, her breathing and chest pain is better.    REVIEW OF SYSTEMS: A detailed 10 point review of systems is been assessed and the pertinent symptoms as mentioned in history of present illness. She denies any liver or chills, blood in the stool or blood in the urine denies any abdominal pain complains of chronic constipation. She says because of lack of insurance in the past  she hasn't gotten  cancer screening test recently  PAST MEDICAL HISTORY: Past Medical History  Diagnosis Date  . Colitis   . Obesity (BMI 30-39.9) 09/23/2012  . Anxiety associated with depression 09/23/2012  . Hx of tobacco use, presenting hazards to health 09/23/2012  . H/O gastroesophageal reflux (GERD) 09/23/2012  . At high risk for falls 09/23/2012    She has had a fractured ankle and tendon tear with falls over the last couple years.  . Diverticulitis of large intestine with perforation 09/23/2012  . GERD (gastroesophageal reflux disease)     PAST SURGICAL HISTORY: Past Surgical History  Procedure Laterality Date  . Appendectomy    . Tonsillectomy    . Bladder repair      FAMILY HISTORY Family History  Problem Relation Age of Onset  . Cancer Paternal Grandmother     breast      SOCIAL HISTORY: She stopped smoking 10 years ago. she denies any alcohol use or any illicit drug use   HEALTH MAINTENANCE: History  Substance Use Topics  . Smoking status: Former Smoker    Quit date: 01/09/2001  . Smokeless tobacco: Never Used  . Alcohol Use: No       Allergies  Allergen Reactions  . Sulfa Antibiotics     Current Facility-Administered Medications  Medication Dose Route Frequency Provider Last Rate Last Dose  . 0.9 %  sodium chloride infusion   Intravenous Continuous Brand Males, MD 75 mL/hr at 06/25/13 1130    . ALPRAZolam Duanne Moron) tablet 1 mg  1 mg Oral TID PRN Caren Griffins, MD   1 mg at 06/26/13 1239  . buPROPion [facility]  XL) 24 hr tablet 300 mg  300 mg Oral Daily Costin Karlyne Greenspan, MD   300 mg at 06/26/13 1057  . citalopram (CELEXA) tablet 40 mg  40 mg Oral Daily Costin Karlyne Greenspan, MD   40 mg at 06/26/13 1058  . fentaNYL (SUBLIMAZE) injection 12.5 mcg  12.5 mcg Intravenous Q2H PRN Caren Griffins, MD   12.5 mcg at 06/26/13 1239  . HYDROcodone-acetaminophen (NORCO/VICODIN) 5-325 MG per tablet 1 tablet  1 tablet Oral Q4H PRN Caren Griffins, MD   1 tablet  at 06/26/13 1804  . insulin aspart (novoLOG) injection 2-6 Units  2-6 Units Subcutaneous 6 times per day Brand Males, MD   6 Units at 06/26/13 1746  . methylPREDNISolone sodium succinate (SOLU-MEDROL) 500 mg in sodium chloride 0.9 % 50 mL IVPB  500 mg Intravenous 4 times per day Brand Males, MD   500 mg at 06/26/13 1747  . metoCLOPramide (REGLAN) injection 5 mg  5 mg Intravenous Q8H PRN Brand Males, MD      . pantoprazole (PROTONIX) EC tablet 40 mg  40 mg Oral QHS Oswald Hillock, MD      . polyethylene glycol (MIRALAX / GLYCOLAX) packet 17 g  17 g Oral BID PRN Caren Griffins, MD   17 g at 06/25/13 2316  . sodium chloride 0.9 % injection 10-40 mL  10-40 mL Intracatheter Q12H Oswald Hillock, MD   10 mL at 06/26/13 1058  . sodium chloride 0.9 % injection 10-40 mL  10-40 mL Intracatheter PRN Oswald Hillock, MD        OBJECTIVE: Patient is obese well-nourished on oxygen mask Filed Vitals:   06/26/13 1900  BP: 106/56  Pulse: 95  Temp:   Resp: 26     Body mass index is 46.66 kg/(m^2).      HEENT PERRLA, Sclerae anicteric, conjunctiva no pallor  Ear-nose-throat: Oropharynx clear, dentition fair Lymphatic: No cervical or supraclavicular adenopathy Lungs:on oxygen mask, clear to auscultation anteriorly  Heart regular rate and rhythm, no murmur appreciated Abd soft, nontender, positive bowel sounds MSK no focal spinal tenderness, no pedal edema, no cyanosis no clubbing Neuro: non-focal, well-oriented, appropriate affect Skin: intact, no skin rash noted Psychological: Normal mood and affect  LAB RESULTS:  CMP     Component Value Date/Time   NA 141 06/26/2013 1225   K 4.1 06/26/2013 1225   CL 103 06/26/2013 1225   CO2 26 06/26/2013 1225   GLUCOSE 141* 06/26/2013 1225   BUN 27* 06/26/2013 1225   CREATININE 0.82 06/26/2013 1225   CALCIUM 8.7 06/26/2013 1225   PROT 7.1 06/26/2013 1225   ALBUMIN 2.8* 06/26/2013 1225   AST 31 06/26/2013 1225   ALT 30 06/26/2013 1225   ALKPHOS 126*  06/26/2013 1225   BILITOT 0.6 06/26/2013 1225   GFRNONAA 81* 06/26/2013 1225   GFRAA >90 06/26/2013 1225    I No results found for this basename: SPEP, UPEP,  kappa and lambda light chains    Lab Results  Component Value Date   WBC 20.7* 06/26/2013   NEUTROABS 14.2* 06/23/2013   HGB 7.2* 06/26/2013   HCT 23.6* 06/26/2013   MCV 78.9 06/26/2013   PLT 362 06/26/2013    @LASTCHEMISTRY @  No results found for this basename: LABCA2    No components found with this basename: LABCA125     Recent Labs Lab 06/23/13 2345  INR 1.22    Urinalysis    Component Value Date/Time   COLORURINE YELLOW 06/23/2013  2359   APPEARANCEUR TURBID* 06/23/2013 2359   LABSPEC >1.046* 06/23/2013 2359   PHURINE 6.0 06/23/2013 2359   GLUCOSEU NEGATIVE 06/23/2013 2359   HGBUR LARGE* 06/23/2013 [place] 06/23/2013 [place] 06/23/2013 2359   PROTEINUR 100* 06/23/2013 2359   UROBILINOGEN 0.2 06/23/2013 2359   NITRITE NEGATIVE 06/23/2013 2359   LEUKOCYTESUR MODERATE* 06/23/2013 2359    STUDIES: Ct Angio Chest Pe W/cm &/or Wo Cm  06/23/2013   CLINICAL DATA:  Shortness of breath.  Hemoptysis.  EXAM: CT ANGIOGRAPHY CHEST WITH CONTRAST  TECHNIQUE: Multidetector CT imaging of the chest was performed using the standard protocol during bolus administration of intravenous contrast. Multiplanar CT image reconstructions and MIPs were obtained to evaluate the vascular anatomy.  CONTRAST:  139mL OMNIPAQUE IOHEXOL 350 MG/ML SOLN  COMPARISON:  Chest x-ray earlier today.  FINDINGS: No filling defects in the pulmonary arteries to suggest pulmonary emboli. Heart is normal size. Aorta is normal caliber.  Severe bilateral ground-glass airspace opacities with relative sparing of the lung bases. This presumably represents infection or hemorrhage. No pleural effusions. Scattered small mediastinal lymph nodes, none pathologically enlarged. No hilar or axillary adenopathy.  Chest wall soft tissues are  unremarkable. Imaging into the upper abdomen shows no acute findings.  Review of the MIP images confirms the above findings.  IMPRESSION: No evidence of pulmonary embolus.  Severe bilateral ground-glass airspace opacities with relative sparing of the lung bases. This is likely scratch head this presumably represents diffuse infection/pneumonia or hemorrhage.   Electronically Signed   By: Rolm Baptise M.D.   On: 06/23/2013 19:05   Dg Chest Port 1 View  06/26/2013   CLINICAL DATA:  Shortness of Breath  EXAM: PORTABLE CHEST - 1 VIEW  COMPARISON:  June 25, 2013  FINDINGS: Widespread alveolar edema is stable. There is no appreciable new opacity. Central catheter tip is at the cavoatrial junction without pneumothorax. Heart is borderline enlarged with pulmonary vascularity within normal limits. No adenopathy is appreciable.  IMPRESSION: Central catheter tip at cavoatrial junction without pneumothorax. Diffuse alveolar opacity remains, probably representing edema. Differential considerations for these opacities include ARDS; congestive heart failure; pneumonia; pulmonary hemorrhage. More than one of these entities may exist concurrently. The overall appearance of the lungs is stable compared to 1 day prior.   Electronically Signed   By: Lowella Grip M.D.   On: 06/26/2013 11:37   Dg Chest Port 1 View  06/25/2013   CLINICAL DATA:  Respiratory distress  EXAM: PORTABLE CHEST - 1 VIEW  COMPARISON:  Portable chest x-ray of May 23, 2013 and chest CT scan of the same day.  FINDINGS: Widespread pulmonary parenchymal opacities are again demonstrated. The age of greater on the right than on the left. Minimal sparing of the periphery of the left lung is again demonstrated. The right heart border is obscured. The left heart border is normal. The pulmonary vascularity is obscured.  IMPRESSION: Widespread alveolar opacities are more conspicuous today. The findings are consistent with pneumonia or pulmonary hemorrhage or other  alveolar filling process.   Electronically Signed   By: David  Martinique   On: 06/25/2013 10:55   Dg Chest Port 1 View  06/23/2013   CLINICAL DATA:  Shortness of breath, cough and hemoptysis.  EXAM: PORTABLE CHEST - 1 VIEW  COMPARISON:  09/23/2012  FINDINGS: The cardiac silhouette, mediastinal and hilar contours are within normal limits and stable. There is a diffuse airspace process. This could be diffuse infection or  inflammation. Noncardiogenic pulmonary edema is also possible but less likely. No pleural effusion. The bony thorax is intact.  IMPRESSION: Diffuse and fairly fulminant bilateral airspace process unlikely noncardiogenic pulmonary edema and more likely infection or severe inflammation.   Electronically Signed   By: Kalman Jewels M.D.   On: 06/23/2013 17:28    IMPRESSION/RECOMMENDATIONS:   53 y.o.  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  She was started on high-dose steroids on 06/24/2013 and first dose of Rituxan was initiated yesterday. During the hospital course she was transfused with 1 unit of packed RBCs and her hemoglobin and hematocrit is stable at this time. Her chest x-ray stable, though her oxygen requirement increased. patient clinically feeling better with improvement in her breathing and chest pain whencompared to this morning. I have reviewed peripheral blood smear and that revealed no schistocytes.   I have discussed in detail patient's condition with Dr. Chase Caller and agreed to plan for plasmapheresis, if patient's condition does not improve. We'll closely monitor the patient's condition.Continue steroids and rituxan as scheduled.   Thank you Dr. Chase Caller for giving an opportunity to participate in this very  pleasant patient care will follow the patient along with you during the hospital course  Wilmon Arms, MD  Hematology and medical oncology   06/26/2013 7:09 PM

## 2013-06-27 ENCOUNTER — Inpatient Hospital Stay (HOSPITAL_COMMUNITY): Payer: 59

## 2013-06-27 DIAGNOSIS — R0902 Hypoxemia: Secondary | ICD-10-CM

## 2013-06-27 DIAGNOSIS — F341 Dysthymic disorder: Secondary | ICD-10-CM

## 2013-06-27 DIAGNOSIS — D649 Anemia, unspecified: Secondary | ICD-10-CM

## 2013-06-27 DIAGNOSIS — I776 Arteritis, unspecified: Secondary | ICD-10-CM

## 2013-06-27 DIAGNOSIS — J9600 Acute respiratory failure, unspecified whether with hypoxia or hypercapnia: Secondary | ICD-10-CM

## 2013-06-27 DIAGNOSIS — R042 Hemoptysis: Secondary | ICD-10-CM

## 2013-06-27 LAB — DIFFERENTIAL
BASOS PCT: 0 % (ref 0–1)
Band Neutrophils: 0 % (ref 0–10)
Basophils Absolute: 0 10*3/uL (ref 0.0–0.1)
Blasts: 0 %
Eosinophils Absolute: 0 10*3/uL (ref 0.0–0.7)
Eosinophils Relative: 0 % (ref 0–5)
Lymphocytes Relative: 2 % — ABNORMAL LOW (ref 12–46)
Lymphs Abs: 0.4 10*3/uL — ABNORMAL LOW (ref 0.7–4.0)
MYELOCYTES: 0 %
Metamyelocytes Relative: 0 %
Monocytes Absolute: 0.6 10*3/uL (ref 0.1–1.0)
Monocytes Relative: 3 % (ref 3–12)
NEUTROS ABS: 19.7 10*3/uL — AB (ref 1.7–7.7)
NEUTROS PCT: 95 % — AB (ref 43–77)
Promyelocytes Absolute: 0 %
WBC MORPHOLOGY: INCREASED
nRBC: 0 /100 WBC

## 2013-06-27 LAB — COMPREHENSIVE METABOLIC PANEL
ALBUMIN: 2.6 g/dL — AB (ref 3.5–5.2)
ALT: 32 U/L (ref 0–35)
AST: 24 U/L (ref 0–37)
Alkaline Phosphatase: 114 U/L (ref 39–117)
BILIRUBIN TOTAL: 0.5 mg/dL (ref 0.3–1.2)
BUN: 30 mg/dL — AB (ref 6–23)
CALCIUM: 8.5 mg/dL (ref 8.4–10.5)
CO2: 26 mEq/L (ref 19–32)
Chloride: 103 mEq/L (ref 96–112)
Creatinine, Ser: 0.86 mg/dL (ref 0.50–1.10)
GFR calc Af Amer: 88 mL/min — ABNORMAL LOW (ref >90)
GFR calc non Af Amer: 76 mL/min — ABNORMAL LOW (ref >90)
Glucose, Bld: 176 mg/dL — ABNORMAL HIGH (ref 70–99)
Potassium: 4.1 mEq/L (ref 3.7–5.3)
Sodium: 140 mEq/L (ref 137–147)
TOTAL PROTEIN: 6.5 g/dL (ref 6.0–8.3)

## 2013-06-27 LAB — CBC
HEMATOCRIT: 23.3 % — AB (ref 36.0–46.0)
Hemoglobin: 7.2 g/dL — ABNORMAL LOW (ref 12.0–15.0)
MCH: 24.6 pg — ABNORMAL LOW (ref 26.0–34.0)
MCHC: 30.9 g/dL (ref 30.0–36.0)
MCV: 79.5 fL (ref 78.0–100.0)
PLATELETS: 325 10*3/uL (ref 150–400)
RBC: 2.93 MIL/uL — AB (ref 3.87–5.11)
RDW: 14.7 % (ref 11.5–15.5)
WBC: 16.1 10*3/uL — ABNORMAL HIGH (ref 4.0–10.5)

## 2013-06-27 LAB — GLUCOSE, CAPILLARY
GLUCOSE-CAPILLARY: 236 mg/dL — AB (ref 70–99)
Glucose-Capillary: 150 mg/dL — ABNORMAL HIGH (ref 70–99)
Glucose-Capillary: 174 mg/dL — ABNORMAL HIGH (ref 70–99)
Glucose-Capillary: 187 mg/dL — ABNORMAL HIGH (ref 70–99)
Glucose-Capillary: 185 mg/dL — ABNORMAL HIGH (ref 70–99)
Glucose-Capillary: 166 mg/dL — ABNORMAL HIGH (ref 70–99)

## 2013-06-27 LAB — RETICULOCYTES
RBC.: 3.09 MIL/uL — ABNORMAL LOW (ref 3.87–5.11)
Retic Count, Absolute: 111.2 10*3/uL (ref 19.0–186.0)
Retic Ct Pct: 3.6 % — ABNORMAL HIGH (ref 0.4–3.1)

## 2013-06-27 LAB — GLUCOSE 6 PHOSPHATE DEHYDROGENASE: G6PDH: 22.8 U/g{Hb} — AB (ref 7.0–20.5)

## 2013-06-27 MED ORDER — PREDNISONE 20 MG PO TABS
60.0000 mg | ORAL_TABLET | Freq: Every day | ORAL | Status: DC
  Administered 2013-06-29 – 2013-06-30 (×2): 60 mg via ORAL
  Filled 2013-06-27 (×2): qty 3

## 2013-06-27 MED ORDER — FOLIC ACID 1 MG PO TABS
5.0000 mg | ORAL_TABLET | Freq: Every day | ORAL | Status: DC
  Administered 2013-06-27 – 2013-07-22 (×25): 5 mg via ORAL
  Filled 2013-06-27 (×28): qty 5

## 2013-06-27 NOTE — Progress Notes (Signed)
[name]   DOB:11/20/1960   WU#:[id number]   GNF#:[id number]  Subjective:  [name] says she feels much better today. She also noticed improvement in her breathing. She is on 6 lit of [place] oxygen.  Objective: Patient is obese and well-nourished Filed Vitals:   06/27/13 1545  BP:   Pulse:   Temp: 98.4 F (36.9 C)  Resp:     Body mass index is 47.38 kg/(m^2).  Intake/Output Summary (Last 24 hours) at 06/27/13 1817 Last data filed at 06/27/13 1700  Gross per 24 hour  Intake    882 ml  Output   1335 ml  Net   -453 ml   HEENT: PERRLA, sclerae anicteric, conjunctiva no pallor               Oropharynx clear  No peripheral adenopathy  Lungs clear -- bibasilar  rales  Heart regular rate and rhythm  Abdomen benign, no masses appreciated  MSK no focal spinal tenderness, no peripheral edema  Neuro nonfocal          Skin: intact      Recent Labs  06/27/13 0918 06/27/13 1225 06/27/13 1531  GLUCAP 174* 236* 185*     Labs:  Lab Results  Component Value Date   WBC 16.1* 06/27/2013   HGB 7.2* 06/27/2013   HCT 23.3* 06/27/2013   MCV 79.5 06/27/2013   PLT 325 06/27/2013   NEUTROABS 19.7* 06/26/2013    '@LASTCHEMISTRY' @  Urine Studies No results found for this basename: UACOL, UAPR, USPG, UPH, UTP, UGL, UKET, UBIL, UHGB, UNIT, UROB, ULEU, UEPI, UWBC, URBC, UBAC, CAST, CRYS, UCOM, BILUA,  in the last 72 hours  Basic Metabolic Panel:  Recent Labs Lab 06/24/13 0304 06/25/13 0318 06/25/13 1045 06/26/13 0520 06/26/13 1225 06/27/13 0600  NA 139 140 139  --  141 140  K 3.6* 4.0 3.6*  --  4.1 4.1  CL 100 100 98  --  103 103  CO2 '26 27 26  ' --  26 26  GLUCOSE 94 157* 188*  --  141* 176*  BUN '15 16 20  ' --  27* 30*  CREATININE 0.83 0.83 0.82  --  0.82 0.86  CALCIUM 8.7 9.0 9.2  --  8.7 8.5  MG 1.9 2.0  --  2.2  --   --   PHOS 3.0 3.0  --  2.7  --   --    GFR Estimated Creatinine Clearance: 118.4 ml/min (by C-G formula based on Cr of 0.86). Liver Function Tests:  Recent  Labs Lab 06/24/13 0304 06/26/13 1225 06/27/13 0600  AST '10 31 24  ' ALT 10 30 32  ALKPHOS 98 126* 114  BILITOT 0.4 0.6 0.5  PROT 6.7 7.1 6.5  ALBUMIN 2.8* 2.8* 2.6*   No results found for this basename: LIPASE, AMYLASE,  in the last 168 hours No results found for this basename: AMMONIA,  in the last 168 hours Coagulation profile  Recent Labs Lab 06/23/13 2345  INR 1.22    CBC:  Recent Labs Lab 06/23/13 1713 06/24/13 0304 06/24/13 1009 06/25/13 0318 06/26/13 0520 06/27/13 0600  WBC 16.0* 15.1* 14.7* 12.9* 20.7* 16.1*  NEUTROABS 14.2*  --   --   --  19.7*  --   HGB 8.1* 6.8* 7.4* 7.8* 7.2* 7.2*  HCT 26.1* 22.2* 24.1* 25.3* 23.6* 23.3*  MCV 77.4* 78.4 79.5 79.1 78.9 79.5  PLT 344 324 298 324 362 325   Cardiac Enzymes:  Recent Labs Lab 06/23/13 2345 06/24/13  0304  CKTOTAL 31  --   CKMB 2.2  --   TROPONINI <0.30 <0.30   BNP: No components found with this basename: POCBNP,  CBG:  Recent Labs Lab 06/26/13 2343 06/27/13 0338 06/27/13 0918 06/27/13 1225 06/27/13 1531  GLUCAP 187* 150* 174* 236* 185*   D-Dimer No results found for this basename: DDIMER,  in the last 72 hours Hgb A1c No results found for this basename: HGBA1C,  in the last 72 hours Lipid Profile No results found for this basename: CHOL, HDL, LDLCALC, TRIG, CHOLHDL, LDLDIRECT,  in the last 72 hours Thyroid function studies No results found for this basename: TSH, T4TOTAL, FREET3, T3FREE, THYROIDAB,  in the last 72 hours Anemia work up No results found for this basename: VITAMINB12, FOLATE, FERRITIN, TIBC, IRON, RETICCTPCT,  in the last 72 hours Microbiology Recent Results (from the past 240 hour(s))  CULTURE, BLOOD (ROUTINE X 2)     Status: None   Collection Time    06/23/13  6:41 PM      Result Value Ref Range Status   Specimen Description BLOOD LEFT ANTECUBITAL   Final   Special Requests BOTTLES DRAWN AEROBIC AND ANAEROBIC 3ML   Final   Culture  Setup Time     Final   Value:  06/23/2013 23:34     Performed at [facility]   Culture     Final   Value:        BLOOD CULTURE RECEIVED NO GROWTH TO DATE CULTURE WILL BE HELD FOR 5 DAYS BEFORE ISSUING A FINAL NEGATIVE REPORT     Performed at [facility]   Report Status PENDING   Incomplete  CULTURE, BLOOD (ROUTINE X 2)     Status: None   Collection Time    06/23/13  6:41 PM      Result Value Ref Range Status   Specimen Description BLOOD LEFT HAND   Final   Special Requests BOTTLES DRAWN AEROBIC AND ANAEROBIC 3ML   Final   Culture  Setup Time     Final   Value: 06/23/2013 23:34     Performed at [facility]   Culture     Final   Value:        BLOOD CULTURE RECEIVED NO GROWTH TO DATE CULTURE WILL BE HELD FOR 5 DAYS BEFORE ISSUING A FINAL NEGATIVE REPORT     Performed at [facility]   Report Status PENDING   Incomplete  MRSA PCR SCREENING     Status: None   Collection Time    06/23/13 10:23 PM      Result Value Ref Range Status   MRSA by PCR NEGATIVE  NEGATIVE Final   Comment:            The GeneXpert MRSA Assay (FDA     approved for NASAL specimens     only), is one component of a     comprehensive MRSA colonization     surveillance program. It is not     intended to diagnose MRSA     infection nor to guide or     monitor treatment for     MRSA infections.  RESPIRATORY VIRUS PANEL     Status: None   Collection Time    06/23/13 10:51 PM      Result Value Ref Range Status   Source - RVPAN NOSE   Final   Respiratory Syncytial Virus A NOT DETECTED   Final   Respiratory Syncytial Virus  B NOT DETECTED   Final   Influenza A NOT DETECTED   Final   Influenza B NOT DETECTED   Final   Parainfluenza 1 NOT DETECTED   Final   Parainfluenza 2 NOT DETECTED   Final   Parainfluenza 3 NOT DETECTED   Final   Metapneumovirus NOT DETECTED   Final   Rhinovirus NOT DETECTED   Final   Adenovirus NOT DETECTED   Final   Influenza A H1 NOT DETECTED   Final   Influenza A H3 NOT DETECTED    Final   Comment: (NOTE)           Normal Reference Range for each Analyte: NOT DETECTED     Testing performed using the Luminex xTAG Respiratory Viral Panel test     kit.     This test was developed and its performance characteristics determined     by [facility]. It has not been cleared or approved by the [place]     Food and Drug Administration. This test is used for clinical purposes.     It should not be regarded as investigational or for research. This     laboratory is certified under the [place] (CLIA) as qualified to perform high complexity     clinical laboratory testing.     Performed at [facility]      Studies:  Dg Chest Port 1 View  06/27/2013   CLINICAL DATA:  Shortness of breath.  Alveolar hemorrhage.  EXAM: PORTABLE CHEST - 1 VIEW  COMPARISON:  June 26, 2013.  FINDINGS: Stable cardiomediastinal silhouette. Right-sided PICC line is noted with distal tip in expected position of the SVC. No pneumothorax or pleural effusion is noted. Stable bilateral diffuse alveolar opacity is noted most consistent with edema or pneumonia. Acute respiratory distress syndrome may also be considered. Bony thorax intact.  IMPRESSION: Stable bilateral diffuse alveolar opacities are noted consistent with edema, pneumonia or possibly acute respiratory distress syndrome.   Electronically Signed   By: Sabino Dick M.D.   On: 06/27/2013 11:26   Dg Chest Port 1 View  06/26/2013   CLINICAL DATA:  Shortness of Breath  EXAM: PORTABLE CHEST - 1 VIEW  COMPARISON:  June 25, 2013  FINDINGS: Widespread alveolar edema is stable. There is no appreciable new opacity. Central catheter tip is at the cavoatrial junction without pneumothorax. Heart is borderline enlarged with pulmonary vascularity within normal limits. No adenopathy is appreciable.  IMPRESSION: Central catheter tip at cavoatrial junction without pneumothorax. Diffuse alveolar opacity remains,  probably representing edema. Differential considerations for these opacities include ARDS; congestive heart failure; pneumonia; pulmonary hemorrhage. More than one of these entities may exist concurrently. The overall appearance of the lungs is stable compared to 1 day prior.   Electronically Signed   By: Lowella Grip M.D.   On: 06/26/2013 11:37    IMPRESSION/RECOMMENDATIONS:  53 y.o. old pleasant female with history of diverticulitis with sigmoid colon perforation, obesity, GERD, chronic constipation was admitted on 06/23/2013 with acute hypoxia with respiratory failure due to diffuse alveolar hemorrhage .She was positive for ANCA vasculitis.  She was started on high-dose steroids on 06/24/2013 and first dose of Rituxan was initiated 06/25/2013. During the hospital course she was transfused with 1 unit of packed RBCs and her hemoglobin and hematocrit is stable at this time. Her chest x-ray stable, and her oxygen requirement is decreasing today. patient clinically feeling better with improvement in  her breathing and chest pain . I have reviewed peripheral blood smear and that revealed no schistocytes.  I have discussed in detail patient's condition with Dr. Beryle Beams today and hematology perspective, no indication for TPE at this time.  I have also discussed the patient's condition  with Dr. Chase Caller, since patient is stable and she clinically improving they were planning to hold off on  Plasmapheresis.Continue steroids and rituxan as scheduled.  Will order anemia workup and start folic acid 5 mg by mouth once daily   Wilmon Arms, MD 06/27/2013  6:17 PM

## 2013-06-27 NOTE — Progress Notes (Signed)
Placed follow-up visit from ystrdy after referring pt to [place]. Pt said she had a good visit ystrdy w/Chaplain and said she is in a good place today. Her co-worker was bedside. Pt said she would have nurse to pg if additional spt is needed. Pt said she had a bad panic attack ystrdy.  Better today. Ernest Haber Chaplain [phone]

## 2013-06-27 NOTE — Progress Notes (Signed)
PULMONARY / CRITICAL CARE MEDICINE   Name: [name] MRN: [id number] DOB: August 26, 1960    ADMISSION DATE:  06/23/2013 CONSULTATION DATE:  6/16  REFERRING MD : Doyle Askew  PRIMARY SERVICE: Hospitalist   CHIEF COMPLAINT: Shortness of breath, hemoptysis, diffuse alveolar hemorrhage   BRIEF PATIENT DESCRIPTION: 53 y.o. Female with h/o obesity, diverticulitis with sigmoid colon perforation, and GERD who presented to [facility] ED on 6/15 by EMS with c/o hemoptysis 2 days ago and cough for 2-3 days. Her O2 sats were in the 70's upon EMS arrival. She was weaned to 4L [place] in the ED. Subsequent CT chest in ED with severe bilateral groundglass airspace opacities suspicious for infection/hemorrhage and admitted to SDU. 6/16 PCCM consulted.    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 Severe bilateral ground-glass airspace opacities with relative sparing of the lung bases. This is likely scratch head this presumably represents diffuse infection/pneumonia or hemorrhage.  CXR 6/15: Diffuse and fairly fulminant bilateral airspace process unlikely noncardiogenic pulmonary edema and more likely infection or severe inflammation.  6/16 PCCM  consulted . Started high dose solumedrol x 5 days Echo 6/16: mild basal hypertrophy of septum, EF 55-60%, Right ventricle mildly dilated  6/17 worsening SOB, now on venti mask. Start weekly, Q Wednesday  Rituxan  X 4 weeks   06/26/13 - still on 100% NRB but hgb holding    [place]:  Looks better. But gets very anxious easily    VITAL SIGNS: Temp:  [97.8 F (36.6 C)-98.6 F (37 C)] 98 F (36.7 C) (06/19 0400) Pulse Rate:  [81-102] 81 (06/19 0800) Resp:  [18-36] 24 (06/19 0800) BP: (106-150)/(51-99) 150/83 mmHg (06/19 0800) SpO2:  [88 %-98 %] 96 % (06/19 0800) Weight:  [145.605 kg (321 lb)] 145.605 kg (321 lb) (06/19 0400) 6 liters w/ intermitted FM     INTAKE / OUTPUT: Intake/Output     06/18 0701 - 06/19 0700 06/19 0701 - 06/20 0700   P.O. 900    I.V. (mL/kg) 525 (3.6)    Other     IV Piggyback 208    Total Intake(mL/kg) 1633 (11.2)    Urine (mL/kg/hr) 1635 (0.5)    Total Output 1635     Net -2           PHYSICAL EXAMINATION: General: anxious, obese female, talking w/ many questions. Not in acute distress but gets anxious very easily  Neuro: A/Ox4, MAEs  HEENT: NRB Cardiovascular: RRR Lungs: Right > Left crackles some discomfort w/ deep breath  Abdomen: Obese, soft, non-distended, non-tender Musculoskeletal: no cyanosis, no clubbing, no deformity  Skin: intact, no edema  LABS:  CBC  Recent Labs Lab 06/25/13 0318 06/26/13 0520 06/27/13 0600  WBC 12.9* 20.7* 16.1*  HGB 7.8* 7.2* 7.2*  HCT 25.3* 23.6* 23.3*  PLT 324 362 325   Coag's  Recent Labs Lab 06/23/13 2345  APTT 42*  INR 1.22   BMET  Recent Labs Lab 06/25/13 1045 06/26/13 1225 06/27/13 0600  NA 139 141 140  K 3.6* 4.1 4.1  CL 98 103 103  CO2 26 26 26   BUN 20 27* 30*  CREATININE 0.82 0.82 0.86  GLUCOSE 188* 141* 176*   Electrolytes  Recent Labs Lab 06/24/13 0304 06/25/13 0318 06/25/13 1045 06/26/13 0520 06/26/13 1225 06/27/13 0600  CALCIUM 8.7 9.0 9.2   --  8.7 8.5  MG 1.9 2.0  --  2.2  --   --   PHOS 3.0 3.0  --  2.7  --   --    Sepsis Markers  Recent Labs Lab 06/23/13 1904 06/23/13 2345 06/24/13 0304 06/25/13 0318  LATICACIDVEN 1.21 0.8  --   --   PROCALCITON  --  <0.10 <0.10 <0.10   ABG  Recent Labs Lab 06/23/13 2120  PHART 7.429  PCO2ART 38.6  PO2ART 62.4*   Liver Enzymes  Recent Labs Lab 06/24/13 0304 06/26/13 1225 06/27/13 0600  AST 10 31 24   ALT 10 30 32  ALKPHOS 98 126* 114  BILITOT 0.4 0.6 0.5  ALBUMIN 2.8* 2.8* 2.6*   Cardiac Enzymes  Recent Labs Lab 06/23/13 1713 06/23/13 2345 06/24/13 0304  TROPONINI  --  <0.30 <0.30  PROBNP 582.7* 599.2* 522.3*   Glucose  Recent Labs Lab 06/26/13 1305 06/26/13 1659 06/26/13 1958 06/26/13 2343 06/27/13 0338 06/27/13 0918  GLUCAP 159* 248* 172* 187* 150* 174*    Imaging Dg Chest Port 1 View  06/27/2013   CLINICAL DATA:  Shortness of breath.  Alveolar hemorrhage.  EXAM: PORTABLE CHEST - 1 VIEW  COMPARISON:  June 26, 2013.  FINDINGS: Stable cardiomediastinal silhouette. Right-sided PICC line is noted with distal tip in expected position of the SVC. No pneumothorax or pleural effusion is noted. Stable bilateral diffuse alveolar opacity is noted most consistent with edema or pneumonia. Acute respiratory distress syndrome may also be considered. Bony thorax intact.  IMPRESSION: Stable bilateral diffuse alveolar opacities are noted consistent with edema, pneumonia or possibly acute respiratory distress syndrome.   Electronically Signed   By: Sabino Dick M.D.   On: 06/27/2013 11:26   Dg Chest Port 1 View  06/26/2013   CLINICAL DATA:  Shortness of Breath  EXAM: PORTABLE CHEST - 1 VIEW  COMPARISON:  June 25, 2013  FINDINGS: Widespread alveolar edema is stable. There is no appreciable new opacity. Central catheter tip is at the cavoatrial junction without pneumothorax. Heart is borderline enlarged with pulmonary vascularity within normal limits. No adenopathy is  appreciable.  IMPRESSION: Central catheter tip at cavoatrial junction without pneumothorax. Diffuse alveolar opacity remains, probably representing edema. Differential considerations for these opacities include ARDS; congestive heart failure; pneumonia; pulmonary hemorrhage. More than one of these entities may exist concurrently. The overall appearance of the lungs is stable compared to 1 day prior.   Electronically Signed   By: Lowella Grip M.D.   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 Looking a little better clinically   P:  Continue aggressive steroids (Solumedrol 500mg   Q6h IV) since 06/24/13 x 5 days (  start pred at 60 mg on 6/211/5) RITUXAN 375mg /m2 Q week x 4 weeks; first dose given 06/25/13, next 07/02/13 (will need to be ordered)  - hydration with rituxan F/u G6PD 6/17 and if negative, start Bactrim 1DS M, W, F schedule for PCP prophylaxis If worse, consider PLEX (renal and heme will not offer technical support) Wean FIO2 Intermittent CXR  CARDIOVASCULAR  A:  No evidence of CHF  Echo 6/16: mild basal hypertrophy of septum, EF 55-60%, Right ventricle mildly dilated  P:  Cont tele monitoring  Even to neg fluid balance   RENAL  A:  No clear cut but possible evidence of pulmonary - renal syndrome  Did have pyruria and hematuria (sterile); so possible  P:  F/u CMP Monitor    GASTROINTESTINAL  A:  H/o GERD - epigastric pain . Hep B panel negative (Rituxan risk factor) P:  Ct PPI  Ct clear liquid diet - may need to be NPO for bipap Ct Reglan   HEMATOLOGIC  A:  Anemia associated with alveolar hemorrhage - stable Received 1 unit PRBC for hgb < 7gm% - repeat CBC improved  P:  F/u CBC   xfuse for hgb < 7gm% only (ICU guidelines)  INFECTIOUS  A:  Possible pneumonia on Chest CT 6/15 - more likely alveolar hemorrhage  Leukocytosis  - no fevers or increased sputum production P:  F/u CBC   ENDOCRINE  A:  Steroid induced hyperglycemia  P:  SSI-->High levels of steroids for alveolar hemorrhage   NEUROLOGIC  A:  intact  P:  monitor    TODAY'S SUMMARY: looking a little better. Think we can avoid plasmapheresis  . OPD fu with Dr Chase Caller when better. Stay on [place] NP  Pulmonary and [place] Pager: [phone]  06/27/2013, 10:52 AM 06/27/2013 10:52 AM   STAFF NOTE: I, Dr Ann Lions have personally reviewed patient's available data, including medical history, events of note, physical examination and test results as part of my evaluation. I have discussed with resident/NP and other care providers such as pharmacist, RN and RRT.  In addition,  I personally evaluated patient and elicited key findings of - acute hypoxemic respiratory failure due to [facility] due to MPA Vasculitis associated with hematuria. She is now d3 of steroids and d2 of Rituxan. She seems to be finally turning the corner: crackles are significantly better, hgb unchanged and down to [facility]. Will let her eat. Continue ICU stay. Still critically ill. Opd fu with Dr Chase Caller.  Rest per NP/medical resident whose note is outlined above and that I agree with  The patient is critically ill with multiple organ systems failure and requires high complexity decision making for assessment and support, frequent evaluation and titration of therapies, application of advanced monitoring technologies and extensive interpretation of multiple databases.   Critical Care Time devoted to patient care services described in this note is  35  Minutes.  Dr. Brand Males, M.D., [facility].C.P Pulmonary and Critical Care Medicine Staff Physician [place] [facility] Pager: [phone], If no answer or between  15:00h - 7:00h: call [phone]  06/27/2013 12:05 PM

## 2013-06-28 ENCOUNTER — Inpatient Hospital Stay (HOSPITAL_COMMUNITY): Payer: 59

## 2013-06-28 DIAGNOSIS — I776 Arteritis, unspecified: Secondary | ICD-10-CM

## 2013-06-28 DIAGNOSIS — R0902 Hypoxemia: Secondary | ICD-10-CM

## 2013-06-28 LAB — COMPREHENSIVE METABOLIC PANEL
ALT: 30 U/L (ref 0–35)
AST: 20 U/L (ref 0–37)
Albumin: 2.7 g/dL — ABNORMAL LOW (ref 3.5–5.2)
Alkaline Phosphatase: 111 U/L (ref 39–117)
BUN: 30 mg/dL — ABNORMAL HIGH (ref 6–23)
CALCIUM: 8.5 mg/dL (ref 8.4–10.5)
CO2: 27 mEq/L (ref 19–32)
Chloride: 102 mEq/L (ref 96–112)
Creatinine, Ser: 0.76 mg/dL (ref 0.50–1.10)
GFR calc Af Amer: >90 mL/min (ref >90)
GFR calc non Af Amer: >90 mL/min (ref >90)
Glucose, Bld: 157 mg/dL — ABNORMAL HIGH (ref 70–99)
POTASSIUM: 4 meq/L (ref 3.7–5.3)
SODIUM: 140 meq/L (ref 137–147)
TOTAL PROTEIN: 6.3 g/dL (ref 6.0–8.3)
Total Bilirubin: 0.6 mg/dL (ref 0.3–1.2)

## 2013-06-28 LAB — IRON AND TIBC
IRON: 30 ug/dL — AB (ref 42–135)
SATURATION RATIOS: 10 % — AB (ref 20–55)
TIBC: 289 ug/dL (ref 250–470)
UIBC: 259 ug/dL (ref 125–400)

## 2013-06-28 LAB — CBC WITH DIFFERENTIAL/PLATELET
BASOS ABS: 0 10*3/uL (ref 0.0–0.1)
BASOS PCT: 0 % (ref 0–1)
Eosinophils Absolute: 0 10*3/uL (ref 0.0–0.7)
Eosinophils Relative: 0 % (ref 0–5)
HCT: 23.7 % — ABNORMAL LOW (ref 36.0–46.0)
Hemoglobin: 7.5 g/dL — ABNORMAL LOW (ref 12.0–15.0)
Lymphocytes Relative: 3 % — ABNORMAL LOW (ref 12–46)
Lymphs Abs: 0.6 10*3/uL — ABNORMAL LOW (ref 0.7–4.0)
MCH: 24.8 pg — ABNORMAL LOW (ref 26.0–34.0)
MCHC: 31.6 g/dL (ref 30.0–36.0)
MCV: 78.5 fL (ref 78.0–100.0)
MONOS PCT: 4 % (ref 3–12)
Monocytes Absolute: 0.7 10*3/uL (ref 0.1–1.0)
Neutro Abs: 15.9 10*3/uL — ABNORMAL HIGH (ref 1.7–7.7)
Neutrophils Relative %: 93 % — ABNORMAL HIGH (ref 43–77)
Platelets: 340 10*3/uL (ref 150–400)
RBC: 3.02 MIL/uL — ABNORMAL LOW (ref 3.87–5.11)
RDW: 14.9 % (ref 11.5–15.5)
WBC: 17.2 10*3/uL — ABNORMAL HIGH (ref 4.0–10.5)

## 2013-06-28 LAB — PHOSPHORUS: PHOSPHORUS: 3 mg/dL (ref 2.3–4.6)

## 2013-06-28 LAB — FOLATE: Folate: 13.3 ng/mL

## 2013-06-28 LAB — GLUCOSE, CAPILLARY
GLUCOSE-CAPILLARY: 153 mg/dL — AB (ref 70–99)
GLUCOSE-CAPILLARY: 262 mg/dL — AB (ref 70–99)
Glucose-Capillary: 214 mg/dL — ABNORMAL HIGH (ref 70–99)
Glucose-Capillary: 157 mg/dL — ABNORMAL HIGH (ref 70–99)

## 2013-06-28 LAB — HAPTOGLOBIN: Haptoglobin: 186 mg/dL (ref 45–215)

## 2013-06-28 LAB — CBC
HCT: 23.8 % — ABNORMAL LOW (ref 36.0–46.0)
HEMOGLOBIN: 7.3 g/dL — AB (ref 12.0–15.0)
MCH: 24.5 pg — ABNORMAL LOW (ref 26.0–34.0)
MCHC: 30.7 g/dL (ref 30.0–36.0)
MCV: 79.9 fL (ref 78.0–100.0)
Platelets: 356 10*3/uL (ref 150–400)
RBC: 2.98 MIL/uL — AB (ref 3.87–5.11)
RDW: 14.9 % (ref 11.5–15.5)
WBC: 17 10*3/uL — ABNORMAL HIGH (ref 4.0–10.5)

## 2013-06-28 LAB — MAGNESIUM: Magnesium: 2.4 mg/dL (ref 1.5–2.5)

## 2013-06-28 LAB — VITAMIN B12: Vitamin B-12: 1049 pg/mL — ABNORMAL HIGH (ref 211–911)

## 2013-06-28 LAB — FERRITIN: FERRITIN: 563 ng/mL — AB (ref 10–291)

## 2013-06-28 MED ORDER — INSULIN ASPART 100 UNIT/ML ~~LOC~~ SOLN
0.0000 [IU] | Freq: Three times a day (TID) | SUBCUTANEOUS | Status: DC
  Administered 2013-06-28 (×2): 11 [IU] via SUBCUTANEOUS
  Administered 2013-06-29 (×2): 4 [IU] via SUBCUTANEOUS
  Administered 2013-06-29: 3 [IU] via SUBCUTANEOUS
  Administered 2013-06-30 – 2013-07-01 (×4): 4 [IU] via SUBCUTANEOUS
  Administered 2013-07-01: 3 [IU] via SUBCUTANEOUS
  Administered 2013-07-02: 4 [IU] via SUBCUTANEOUS
  Administered 2013-07-02 – 2013-07-03 (×2): 3 [IU] via SUBCUTANEOUS
  Administered 2013-07-03: 7 [IU] via SUBCUTANEOUS
  Administered 2013-07-03: 3 [IU] via SUBCUTANEOUS
  Administered 2013-07-04 (×2): 7 [IU] via SUBCUTANEOUS
  Administered 2013-07-04: 3 [IU] via SUBCUTANEOUS
  Administered 2013-07-05: 7 [IU] via SUBCUTANEOUS
  Administered 2013-07-05 (×2): 3 [IU] via SUBCUTANEOUS
  Administered 2013-07-06: 4 [IU] via SUBCUTANEOUS
  Administered 2013-07-06: 3 [IU] via SUBCUTANEOUS
  Administered 2013-07-07 (×2): 7 [IU] via SUBCUTANEOUS
  Administered 2013-07-07 – 2013-07-08 (×2): 4 [IU] via SUBCUTANEOUS
  Administered 2013-07-08 (×2): 3 [IU] via SUBCUTANEOUS
  Administered 2013-07-09: 11 [IU] via SUBCUTANEOUS
  Administered 2013-07-09: 3 [IU] via SUBCUTANEOUS
  Administered 2013-07-09: 11 [IU] via SUBCUTANEOUS
  Administered 2013-07-09: 4 [IU] via SUBCUTANEOUS
  Administered 2013-07-10: 11 [IU] via SUBCUTANEOUS
  Administered 2013-07-10: 4 [IU] via SUBCUTANEOUS
  Administered 2013-07-10: 3 [IU] via SUBCUTANEOUS
  Administered 2013-07-10 – 2013-07-11 (×2): 4 [IU] via SUBCUTANEOUS
  Administered 2013-07-12: 7 [IU] via SUBCUTANEOUS
  Administered 2013-07-12: 4 [IU] via SUBCUTANEOUS
  Administered 2013-07-12 – 2013-07-13 (×2): 3 [IU] via SUBCUTANEOUS
  Administered 2013-07-13 (×2): 11 [IU] via SUBCUTANEOUS
  Administered 2013-07-14 – 2013-07-16 (×8): 4 [IU] via SUBCUTANEOUS
  Administered 2013-07-17: 7 [IU] via SUBCUTANEOUS
  Administered 2013-07-17: 4 [IU] via SUBCUTANEOUS
  Administered 2013-07-18: 7 [IU] via SUBCUTANEOUS
  Administered 2013-07-18: 3 [IU] via SUBCUTANEOUS
  Administered 2013-07-20: 7 [IU] via SUBCUTANEOUS
  Administered 2013-07-21 (×2): 4 [IU] via SUBCUTANEOUS
  Administered 2013-07-21: 3 [IU] via SUBCUTANEOUS

## 2013-06-28 NOTE — Progress Notes (Signed)
PULMONARY / CRITICAL CARE MEDICINE   Name: [name] MRN: [id number] DOB: September 12, 1960    ADMISSION DATE:  06/23/2013 CONSULTATION DATE:  06/23/2013  REFERRING MD : Doyle Askew   CHIEF COMPLAINT: Shortness of breath, hemoptysis, diffuse alveolar hemorrhage   BRIEF PATIENT DESCRIPTION:  53 yo female former smoker presented with cough, hemoptysis for 2 days.  Found to have hypoxia and diffuse pulmonary infiltrates.  PCCM assumed care in ICU.  SIGNIFICANT EVENTS: 6/15 Admit, PCCM consulted  6/16 Start high dose steroids, Transfuse PRBC 6/17 Start weekly Rituxan  6/18 Hematology consulted to assess for plasma exchange  STUDIES: 6/15 CT chest >> diffuse b/l GGO 6/15 P ANCA 1:640, Myeloperoxidase > 8, HIV non reactive, Aldolase 4, CPK 31, Scl 70 < 1, anti GBM < 1, CCP < 2, RF < 10, ds DNA < 1, ANA negative, ESR 90, CRP 22, SSA < 1, SSB < 1 6/15 U/A >> 100 protein, large Hgb, moderate leukocytes 6/16 Echo >> EF 55 to 60% 6/16 Lupus anticoagulant dectected 6/17 Hep B IgM negative, Hep B core Ab negative, Hep B surface Ag negative, Hep B surface Ab negative, Hep C Ab negative 6/17 G6PD >> 22.8  LINES / TUBES:  Rt PICC 6/17 >>   CULTURES:  Respiratory viral panel 6/15 >> negative Legionella 6/15 >> negative Blood 6/15 >>   ANTIBIOTICS:  Levaquin 6/15 >>> 6/17 Ceftriaxone 6/15 >>> one dose 6/15  Azithromycin 6/15 >>> one dose 6/15   SUBJECTIVE:  Desaturates easily.  Trouble taking deep breaths.  Sore in chest.  Denies illicit drug use.  VITAL SIGNS: Temp:  [97.9 F (36.6 C)-98.5 F (36.9 C)] 98.1 F (36.7 C) (06/20 0800) Pulse Rate:  [74-102] 87 (06/20 0800) Resp:  [17-35] 20 (06/20 0800) BP: (118-164)/(55-143) 118/55 mmHg (06/20 0800) SpO2:  [81 %-99 %] 91 % (06/20 0800) INTAKE / OUTPUT: Intake/Output     06/19 0701 - 06/20 0700 06/20 0701 - 06/21 0700   P.O.     I.V. (mL/kg) 120 (0.8) 10 (0.1)   IV Piggyback 216    Total Intake(mL/kg) 336 (2.3) 10 (0.1)   Urine  (mL/kg/hr) 1715 (0.5)    Total Output 1715     Net -1379 +10         PHYSICAL EXAMINATION: General: Anxious, speaks in full sentences, wearing nasal cannula and NRB Neuro: alert, normal strength HEENT: pupils reactive Cardiovascular: regular, no murmur Lungs: diffuse b/l crackles Abdomen: soft, non tender Musculoskeletal: no edema Skin: no rashes  LABS:  CBC  Recent Labs Lab 06/26/13 0520 06/27/13 0600 06/28/13 0400  WBC 20.7* 16.1* 17.0*  HGB 7.2* 7.2* 7.3*  HCT 23.6* 23.3* 23.8*  PLT 362 325 356   Coag's  Recent Labs Lab 06/23/13 2345  APTT 42*  INR 1.22   BMET  Recent Labs Lab 06/26/13 1225 06/27/13 0600 06/28/13 0358  NA 141 140 140  K 4.1 4.1 4.0  CL 103 103 102  CO2 _0 BUN 27* 30* 30*  CREATININE 0.82 0.86 0.76  GLUCOSE 141* 176* 157*   Electrolytes  Recent Labs Lab 06/25/13 0318  06/26/13 0520 06/26/13 1225 06/27/13 0600 06/28/13 0358  CALCIUM 9.0  < >  --  8.7 8.5 8.5  MG 2.0  --  2.2  --   --  2.4  PHOS 3.0  --  2.7  --   --  3.0  < > = values in this interval not displayed.  Sepsis Markers  Recent Labs Lab  06/23/13 1904 06/23/13 2345 06/24/13 0304 06/25/13 0318  LATICACIDVEN 1.21 0.8  --   --   PROCALCITON  --  <0.10 <0.10 <0.10   ABG  Recent Labs Lab 06/23/13 2120  PHART 7.429  PCO2ART 38.6  PO2ART 62.4*   Liver Enzymes  Recent Labs Lab 06/26/13 1225 06/27/13 0600 06/28/13 0358  AST _0 ALT 30 32 30  ALKPHOS 126* 114 111  BILITOT 0.6 0.5 0.6  ALBUMIN 2.8* 2.6* 2.7*   Cardiac Enzymes  Recent Labs Lab 06/23/13 1713 06/23/13 2345 06/24/13 0304  TROPONINI  --  <0.30 <0.30  PROBNP 582.7* 599.2* 522.3*   Glucose  Recent Labs Lab 06/27/13 1225 06/27/13 1531 06/27/13 2012 06/28/13 0002 06/28/13 0357 06/28/13 0732  GLUCAP 236* 185* 166* 214* 153* 157*    Imaging Dg Chest Port 1 View  06/28/2013   CLINICAL DATA:  Followup alveolar hemorrhage, former smoker  EXAM: PORTABLE CHEST -  1 VIEW  COMPARISON:  06/27/2013  FINDINGS: Diffuse bilateral alveolar opacities, slightly more hyper attenuating when compared to the prior study with no significant change in radiographic technique to account for this. This is consistent with mild further interval worsening of the bilateral alveolar pattern. Right PICC line is in unchanged position. Cardiac silhouette largely obscured by the abnormal densities.  IMPRESSION: Slightly worse severe diffuse bilateral alveolar opacification.   Electronically Signed   By: Skipper Cliche M.D.   On: 06/28/2013 07:34   Dg Chest Port 1 View  06/27/2013   CLINICAL DATA:  Shortness of breath.  Alveolar hemorrhage.  EXAM: PORTABLE CHEST - 1 VIEW  COMPARISON:  June 26, 2013.  FINDINGS: Stable cardiomediastinal silhouette. Right-sided PICC line is noted with distal tip in expected position of the SVC. No pneumothorax or pleural effusion is noted. Stable bilateral diffuse alveolar opacity is noted most consistent with edema or pneumonia. Acute respiratory distress syndrome may also be considered. Bony thorax intact.  IMPRESSION: Stable bilateral diffuse alveolar opacities are noted consistent with edema, pneumonia or possibly acute respiratory distress syndrome.   Electronically Signed   By: Sabino Dick M.D.   On: 06/27/2013 11:26   Dg Chest Port 1 View  06/26/2013   CLINICAL DATA:  Shortness of Breath  EXAM: PORTABLE CHEST - 1 VIEW  COMPARISON:  June 25, 2013  FINDINGS: Widespread alveolar edema is stable. There is no appreciable new opacity. Central catheter tip is at the cavoatrial junction without pneumothorax. Heart is borderline enlarged with pulmonary vascularity within normal limits. No adenopathy is appreciable.  IMPRESSION: Central catheter tip at cavoatrial junction without pneumothorax. Diffuse alveolar opacity remains, probably representing edema. Differential considerations for these opacities include ARDS; congestive heart failure; pneumonia; pulmonary  hemorrhage. More than one of these entities may exist concurrently. The overall appearance of the lungs is stable compared to 1 day prior.   Electronically Signed   By: Lowella Grip M.D.   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 Plan:  Continue solumedrol 500 mg q6h from 6/16 thru 6/20 >> transition to prednisone 60 mg daily on 6/21 Started weekly rituxan on 6/17 >> she will get 375 mg/m2 weekly >> maintain hydration while getting rituxan Defer plasma exchange for now >> having some clinical improvement with above therapies Will need PCP prophylaxis F/u CXR Oxygen to keep SpO2 > 92% Defer lung biopsy for now Defer Abx for now Goal  even to negative fluid balance  Protein and Hb in U/A. Plan: Monitor renal f/x  Hx of GERD. Plan:  Carb modified diet while on high dose steroids Protonix  PRN reglan  Anemia associated with alveolar hemorrhage. Leukocytosis >> likely from steroids. Plan:  F/u CBC Transfuse for Hb < 7 SCD for DVT prevention  Steroid induced hyperglycemia  Plan:  SSI    Anxiety. Plan:  Continue bupropion, celexa PRN xanax  CC time 40 minutes  Chesley Mires, MD [place] 06/28/2013, 11:01 AM Pager:  [phone] After 3pm call: [phone]

## 2013-06-29 ENCOUNTER — Inpatient Hospital Stay (HOSPITAL_COMMUNITY): Payer: 59

## 2013-06-29 DIAGNOSIS — Z8719 Personal history of other diseases of the digestive system: Secondary | ICD-10-CM

## 2013-06-29 LAB — GLUCOSE, CAPILLARY
Glucose-Capillary: 189 mg/dL — ABNORMAL HIGH (ref 70–99)
Glucose-Capillary: 156 mg/dL — ABNORMAL HIGH (ref 70–99)
Glucose-Capillary: 146 mg/dL — ABNORMAL HIGH (ref 70–99)
Glucose-Capillary: 190 mg/dL — ABNORMAL HIGH (ref 70–99)
Glucose-Capillary: 180 mg/dL — ABNORMAL HIGH (ref 70–99)

## 2013-06-29 LAB — PHOSPHORUS: Phosphorus: 3.5 mg/dL (ref 2.3–4.6)

## 2013-06-29 LAB — CULTURE, BLOOD (ROUTINE X 2)
CULTURE: NO GROWTH
Culture: NO GROWTH

## 2013-06-29 LAB — MAGNESIUM: Magnesium: 2.4 mg/dL (ref 1.5–2.5)

## 2013-06-29 LAB — COMPREHENSIVE METABOLIC PANEL WITH GFR
ALT: 26 U/L (ref 0–35)
AST: 19 U/L (ref 0–37)
Albumin: 2.6 g/dL — ABNORMAL LOW (ref 3.5–5.2)
Alkaline Phosphatase: 98 U/L (ref 39–117)
BUN: 28 mg/dL — ABNORMAL HIGH (ref 6–23)
CO2: 27 meq/L (ref 19–32)
Calcium: 8.4 mg/dL (ref 8.4–10.5)
Chloride: 101 meq/L (ref 96–112)
Creatinine, Ser: 0.7 mg/dL (ref 0.50–1.10)
GFR calc Af Amer: >90 mL/min
GFR calc non Af Amer: >90 mL/min
Glucose, Bld: 166 mg/dL — ABNORMAL HIGH (ref 70–99)
Potassium: 4 meq/L (ref 3.7–5.3)
Sodium: 139 meq/L (ref 137–147)
Total Bilirubin: 0.6 mg/dL (ref 0.3–1.2)
Total Protein: 5.9 g/dL — ABNORMAL LOW (ref 6.0–8.3)

## 2013-06-29 MED ORDER — ATOVAQUONE 750 MG/5ML PO SUSP
1500.0000 mg | Freq: Every day | ORAL | Status: DC
  Administered 2013-06-29 – 2013-07-22 (×23): 1500 mg via ORAL
  Filled 2013-06-29 (×30): qty 10

## 2013-06-29 NOTE — Progress Notes (Signed)
PULMONARY / CRITICAL CARE MEDICINE   Name: [name] MRN: [id number] DOB: [birth year]    ADMISSION DATE:  06/23/2013 CONSULTATION DATE:  06/23/2013  REFERRING MD : Doyle Askew   CHIEF COMPLAINT: Shortness of breath, hemoptysis, diffuse alveolar hemorrhage   BRIEF PATIENT DESCRIPTION:  53 yo female former smoker presented with cough, hemoptysis for 2 days.  Found to have hypoxia and diffuse pulmonary infiltrates.  PCCM assumed care in ICU.  SIGNIFICANT EVENTS: 6/15 Admit, PCCM consulted  6/16 Start high dose steroids, Transfuse PRBC 6/17 Start weekly Rituxan  6/18 Hematology consulted to assess for plasma exchange >> not indicated at this time 6/21 Transition to prednisone  STUDIES: 6/15 CT chest >> diffuse b/l GGO 6/15 P ANCA 1:640, Myeloperoxidase > 8, HIV non reactive, Aldolase 4, CPK 31, Scl 70 < 1, anti GBM < 1, CCP < 2, RF < 10, ds DNA < 1, ANA negative, ESR 90, CRP 22, SSA < 1, SSB < 1 6/15 U/A >> 100 protein, large Hgb, moderate leukocytes 6/16 Echo >> EF 55 to 60% 6/16 Lupus anticoagulant dectected 6/17 Hep B IgM negative, Hep B core Ab negative, Hep B surface Ag negative, Hep B surface Ab negative, Hep C Ab negative 6/17 G6PD >> 22.8  LINES / TUBES:  Rt PICC 6/17 >>   CULTURES:  Respiratory viral panel 6/15 >> negative Legionella 6/15 >> negative Blood 6/15 >>   ANTIBIOTICS:  Levaquin 6/15 >>> 6/17 Ceftriaxone 6/15 >>> one dose 6/15  Azithromycin 6/15 >>> one dose 6/15   SUBJECTIVE:  Desaturates easily when talking/eating.  Not as much chest pain.  VITAL SIGNS: Temp:  [97.8 F (36.6 C)-99.2 F (37.3 C)] 98 F (36.7 C) (06/21 0800) Pulse Rate:  [78-94] 78 (06/21 0800) Resp:  [19-38] 21 (06/21 0800) BP: (105-161)/(32-97) 125/76 mmHg (06/21 0800) SpO2:  [87 %-100 %] 93 % (06/21 0800) Weight:  [324 lb 1.2 oz (147 kg)] 324 lb 1.2 oz (147 kg) (06/21 0200) INTAKE / OUTPUT: Intake/Output     06/20 0701 - 06/21 0700 06/21 0701 - 06/22 0700   P.O. 720 120    I.V. (mL/kg) 240 (1.6) 10 (0.1)   IV Piggyback     Total Intake(mL/kg) 960 (6.5) 130 (0.9)   Urine (mL/kg/hr) 2780 (0.8)    Total Output 2780     Net -1820 +130        Stool Occurrence 2 x     PHYSICAL EXAMINATION: General: Speaks in full sentences, wearing nasal cannula and NRB Neuro: alert, normal strength HEENT: pupils reactive Cardiovascular: regular, no murmur Lungs: diffuse b/l crackles Abdomen: soft, non tender Musculoskeletal: no edema Skin: no rashes  LABS:  CBC  Recent Labs Lab 06/27/13 0600 06/28/13 0400 06/28/13 1200  WBC 16.1* 17.0* 17.2*  HGB 7.2* 7.3* 7.5*  HCT 23.3* 23.8* 23.7*  PLT 325 356 340   Coag's  Recent Labs Lab 06/23/13 2345  APTT 42*  INR 1.22   BMET  Recent Labs Lab 06/27/13 0600 06/28/13 0358 06/29/13 0520  NA 140 140 139  K 4.1 4.0 4.0  CL 103 102 101  CO2 '26 27 27  ' BUN 30* 30* 28*  CREATININE 0.86 0.76 0.70  GLUCOSE 176* 157* 166*   Electrolytes  Recent Labs Lab 06/26/13 0520  06/27/13 0600 06/28/13 0358 06/29/13 0520  CALCIUM  --   < > 8.5 8.5 8.4  MG 2.2  --   --  2.4 2.4  PHOS 2.7  --   --  3.0 3.5  < > =  values in this interval not displayed.  Sepsis Markers  Recent Labs Lab 06/23/13 1904 06/23/13 2345 06/24/13 0304 06/25/13 0318  LATICACIDVEN 1.21 0.8  --   --   PROCALCITON  --  <0.10 <0.10 <0.10   ABG  Recent Labs Lab 06/23/13 2120  PHART 7.429  PCO2ART 38.6  PO2ART 62.4*   Liver Enzymes  Recent Labs Lab 06/27/13 0600 06/28/13 0358 06/29/13 0520  AST '24 20 19  ' ALT 32 30 26  ALKPHOS 114 111 98  BILITOT 0.5 0.6 0.6  ALBUMIN 2.6* 2.7* 2.6*   Cardiac Enzymes  Recent Labs Lab 06/23/13 1713 06/23/13 2345 06/24/13 0304  TROPONINI  --  <0.30 <0.30  PROBNP 582.7* 599.2* 522.3*   Glucose  Recent Labs Lab 06/28/13 0357 06/28/13 0732 06/28/13 1251 06/28/13 2107 06/29/13 0447 06/29/13 0745  GLUCAP 153* 157* 262* 189* 156* 146*    Imaging Dg Chest Port 1  View  06/29/2013   CLINICAL DATA:  Followup alveolar opacities  EXAM: PORTABLE CHEST - 1 VIEW  COMPARISON:  06/28/2013  FINDINGS: Stable mild cardiac enlargement. Severe bilateral diffuse alveolar opacification, unchanged. No change right central line.  IMPRESSION: Stable severe bilateral airspace disease   Electronically Signed   By: Skipper Cliche M.D.   On: 06/29/2013 08:15   Dg Chest Port 1 View  06/28/2013   CLINICAL DATA:  Followup alveolar hemorrhage, former smoker  EXAM: PORTABLE CHEST - 1 VIEW  COMPARISON:  06/27/2013  FINDINGS: Diffuse bilateral alveolar opacities, slightly more hyper attenuating when compared to the prior study with no significant change in radiographic technique to account for this. This is consistent with mild further interval worsening of the bilateral alveolar pattern. Right PICC line is in unchanged position. Cardiac silhouette largely obscured by the abnormal densities.  IMPRESSION: Slightly worse severe diffuse bilateral alveolar opacification.   Electronically Signed   By: Skipper Cliche M.D.   On: 06/28/2013 07:34   Dg Chest Port 1 View  06/27/2013   CLINICAL DATA:  Shortness of breath.  Alveolar hemorrhage.  EXAM: PORTABLE CHEST - 1 VIEW  COMPARISON:  June 26, 2013.  FINDINGS: Stable cardiomediastinal silhouette. Right-sided PICC line is noted with distal tip in expected position of the SVC. No pneumothorax or pleural effusion is noted. Stable bilateral diffuse alveolar opacity is noted most consistent with edema or pneumonia. Acute respiratory distress syndrome may also be considered. Bony thorax intact.  IMPRESSION: Stable bilateral diffuse alveolar opacities are noted consistent with edema, pneumonia or possibly acute respiratory distress syndrome.   Electronically Signed   By: Sabino Dick M.D.   On: 06/27/2013 11:26    ASSESSMENT / PLAN:   Acute hypoxic respiratory failure with diffuse b/l pulmonary infiltrates >> most likely DAH from Microscopic  polyangiitis. Plan:  Transition to prednisone 60 mg daily on 6/21 Started weekly rituxan on 6/17 >> she will get 375 mg/m2 weekly >> maintain hydration while getting rituxan >> next dose 6/25 Defer plasma exchange for now >> having some clinical improvement with above therapies Add atovaquone 6/21 for PCP prophylaxis (Sulfa allergy) F/u CXR Oxygen to keep SpO2 > 92% Defer lung biopsy for now >> if no further improvement with therapy, then may need lung tissue sampling Defer Abx for now Goal even to negative fluid balance  Protein and Hb in U/A on admission. Plan: Monitor renal f/x  Hx of GERD. Plan:  Carb modified diet while on high dose steroids Protonix  PRN reglan  Anemia associated with alveolar hemorrhage. Leukocytosis >>  likely from steroids. Plan:  F/u CBC Transfuse for Hb < 7 SCD for DVT prevention  Steroid induced hyperglycemia  Plan:  SSI    Anxiety. Plan:  Continue bupropion, celexa PRN xanax  CC time 35 minutes  Chesley Mires, MD [place] 06/29/2013, 9:06 AM Pager:  [phone] After 3pm call: [phone]

## 2013-06-30 ENCOUNTER — Inpatient Hospital Stay (HOSPITAL_COMMUNITY): Payer: 59

## 2013-06-30 LAB — HEPATITIS B DNA, ULTRAQUANTITATIVE, PCR: HEPATITIS B DNA: NOT DETECTED [IU]/mL (ref <20)

## 2013-06-30 LAB — CBC
HEMATOCRIT: 23.4 % — AB (ref 36.0–46.0)
Hemoglobin: 7.3 g/dL — ABNORMAL LOW (ref 12.0–15.0)
MCH: 24.9 pg — AB (ref 26.0–34.0)
MCHC: 31.2 g/dL (ref 30.0–36.0)
MCV: 79.9 fL (ref 78.0–100.0)
Platelets: 302 10*3/uL (ref 150–400)
RBC: 2.93 MIL/uL — ABNORMAL LOW (ref 3.87–5.11)
RDW: 15.3 % (ref 11.5–15.5)
WBC: 18.2 10*3/uL — ABNORMAL HIGH (ref 4.0–10.5)

## 2013-06-30 LAB — GLUCOSE, CAPILLARY
GLUCOSE-CAPILLARY: 153 mg/dL — AB (ref 70–99)
Glucose-Capillary: 142 mg/dL — ABNORMAL HIGH (ref 70–99)
Glucose-Capillary: 116 mg/dL — ABNORMAL HIGH (ref 70–99)
Glucose-Capillary: 193 mg/dL — ABNORMAL HIGH (ref 70–99)
Glucose-Capillary: 219 mg/dL — ABNORMAL HIGH (ref 70–99)

## 2013-06-30 LAB — BASIC METABOLIC PANEL
BUN: 27 mg/dL — AB (ref 6–23)
CALCIUM: 8.1 mg/dL — AB (ref 8.4–10.5)
CO2: 29 mEq/L (ref 19–32)
Chloride: 103 mEq/L (ref 96–112)
Creatinine, Ser: 0.74 mg/dL (ref 0.50–1.10)
GFR calc Af Amer: >90 mL/min (ref >90)
Glucose, Bld: 129 mg/dL — ABNORMAL HIGH (ref 70–99)
Potassium: 3.9 mEq/L (ref 3.7–5.3)
Sodium: 141 mEq/L (ref 137–147)

## 2013-06-30 MED ORDER — METHYLPREDNISOLONE SODIUM SUCC 125 MG IJ SOLR
60.0000 mg | Freq: Two times a day (BID) | INTRAMUSCULAR | Status: DC
  Administered 2013-06-30 – 2013-07-10 (×20): 60 mg via INTRAVENOUS
  Filled 2013-06-30: qty 2
  Filled 2013-06-30: qty 0.96
  Filled 2013-06-30 (×2): qty 2
  Filled 2013-06-30: qty 0.96
  Filled 2013-06-30 (×4): qty 2
  Filled 2013-06-30: qty 0.96
  Filled 2013-06-30: qty 2
  Filled 2013-06-30: qty 0.96
  Filled 2013-06-30 (×8): qty 2
  Filled 2013-06-30 (×3): qty 0.96

## 2013-06-30 NOTE — Progress Notes (Signed)
PULMONARY / CRITICAL CARE MEDICINE   Name: [name] MRN: [id number] DOB: Apr 04, 1960    ADMISSION DATE:  06/23/2013 CONSULTATION DATE:  06/23/2013  REFERRING MD : Doyle Askew   CHIEF COMPLAINT: Shortness of breath, hemoptysis, diffuse alveolar hemorrhage   BRIEF PATIENT DESCRIPTION:  53 yo female former smoker presented with cough, hemoptysis for 2 days.  Found to have hypoxia and diffuse pulmonary infiltrates.  PCCM assumed care in ICU.  SIGNIFICANT EVENTS: 6/15 Admit, PCCM consulted  6/16 Start high dose steroids, Transfuse PRBC 6/17 Start weekly Rituxan  6/18 Hematology consulted to assess for plasma exchange >> not indicated at this time 6/21 Transition to prednisone  STUDIES: 6/15 CT chest >> diffuse b/l GGO 6/15 P ANCA 1:640, Myeloperoxidase > 8, HIV non reactive, Aldolase 4, CPK 31, Scl 70 < 1, anti GBM < 1, CCP < 2, RF < 10, ds DNA < 1, ANA negative, ESR 90, CRP 22, SSA < 1, SSB < 1 6/15 U/A >> 100 protein, large Hgb, moderate leukocytes 6/16 Echo >> EF 55 to 60% 6/16 Lupus anticoagulant dectected 6/17 Hep B IgM negative, Hep B core Ab negative, Hep B surface Ag negative, Hep B surface Ab negative, Hep C Ab negative 6/17 G6PD >> 22.8  LINES / TUBES:  Rt PICC 6/17 >>   CULTURES:  Respiratory viral panel 6/15 >> negative Legionella 6/15 >> negative Blood 6/15 >> ng  ANTIBIOTICS:  Levaquin 6/15 >>> 6/17 Ceftriaxone 6/15 >>> one dose 6/15  Azithromycin 6/15 >>> one dose 6/15   SUBJECTIVE:  Desaturates easily when talking/eating.  Requires intermittent NRB  VITAL SIGNS: Temp:  [98.1 F (36.7 C)-98.8 F (37.1 C)] 98.1 F (36.7 C) (06/22 0800) Pulse Rate:  [78-99] 99 (06/22 0800) Resp:  [19-34] 23 (06/22 0800) BP: (85-151)/(37-93) 113/57 mmHg (06/22 0800) SpO2:  [66 %-100 %] 66 % (06/22 0800) Weight:  [147.3 kg (324 lb 11.8 oz)] 147.3 kg (324 lb 11.8 oz) (06/22 0400) INTAKE / OUTPUT: Intake/Output     06/21 0701 - 06/22 0700 06/22 0701 - 06/23 0700   P.O.  2000    I.V. (mL/kg) 240 (1.6)    Total Intake(mL/kg) 2240 (15.2)    Urine (mL/kg/hr) 3075 (0.9) 300 (0.7)   Total Output 3075 300   Net -835 -300        Stool Occurrence 2 x     PHYSICAL EXAMINATION: General: Speaks in full sentences, wearing nasal cannula and NRB Neuro: alert, normal strength HEENT: pupils reactive Cardiovascular: regular, no murmur Lungs: diffuse b/l crackles Abdomen: soft, non tender Musculoskeletal: no edema Skin: no rashes  LABS:  CBC  Recent Labs Lab 06/28/13 0400 06/28/13 1200 06/30/13 0515  WBC 17.0* 17.2* 18.2*  HGB 7.3* 7.5* 7.3*  HCT 23.8* 23.7* 23.4*  PLT 356 340 302   Coag's  Recent Labs Lab 06/23/13 2345  APTT 42*  INR 1.22   BMET  Recent Labs Lab 06/28/13 0358 06/29/13 0520 06/30/13 0515  NA 140 139 141  K 4.0 4.0 3.9  CL 102 101 103  CO2 '27 27 29  ' BUN 30* 28* 27*  CREATININE 0.76 0.70 0.74  GLUCOSE 157* 166* 129*   Electrolytes  Recent Labs Lab 06/26/13 0520  06/28/13 0358 06/29/13 0520 06/30/13 0515  CALCIUM  --   < > 8.5 8.4 8.1*  MG 2.2  --  2.4 2.4  --   PHOS 2.7  --  3.0 3.5  --   < > = values in this interval not displayed.  Sepsis Markers  Recent Labs Lab 06/23/13 1904 06/23/13 2345 06/24/13 0304 06/25/13 0318  LATICACIDVEN 1.21 0.8  --   --   PROCALCITON  --  <0.10 <0.10 <0.10   ABG  Recent Labs Lab 06/23/13 2120  PHART 7.429  PCO2ART 38.6  PO2ART 62.4*   Liver Enzymes  Recent Labs Lab 06/27/13 0600 06/28/13 0358 06/29/13 0520  AST '24 20 19  ' ALT 32 30 26  ALKPHOS 114 111 98  BILITOT 0.5 0.6 0.6  ALBUMIN 2.6* 2.7* 2.6*   Cardiac Enzymes  Recent Labs Lab 06/23/13 1713 06/23/13 2345 06/24/13 0304  TROPONINI  --  <0.30 <0.30  PROBNP 582.7* 599.2* 522.3*   Glucose  Recent Labs Lab 06/29/13 0447 06/29/13 0745 06/29/13 1151 06/29/13 1558 06/29/13 2253 06/30/13 0748  GLUCAP 156* 146* 190* 180* 142* 116*    Imaging Dg Chest Port 1 View  06/30/2013   CLINICAL  DATA:  Pulmonary infiltrates.  EXAM: PORTABLE CHEST - 1 VIEW  COMPARISON:  June 29, 2013.  FINDINGS: Stable cardiomediastinal silhouette. Right-sided PICC line is again noted and unchanged in position with tip in expected position of the SVC. No pneumothorax or large pleural effusion is noted. Stable severe bilateral diffuse airspace opacities are noted which are unchanged compared to prior exam. Cardiomediastinal silhouette appears normal.  IMPRESSION: Stable severe diffuse airspace opacities are noted concerning for edema or pneumonia.   Electronically Signed   By: Sabino Dick M.D.   On: 06/30/2013 07:02   Dg Chest Port 1 View  06/29/2013   CLINICAL DATA:  Followup alveolar opacities  EXAM: PORTABLE CHEST - 1 VIEW  COMPARISON:  06/28/2013  FINDINGS: Stable mild cardiac enlargement. Severe bilateral diffuse alveolar opacification, unchanged. No change right central line.  IMPRESSION: Stable severe bilateral airspace disease   Electronically Signed   By: Skipper Cliche M.D.   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 Plan:  Back to solumedrol 60 q 12h (68m/kg) Started weekly rituxan on 6/17 >> she got375 mg/m2 weekly >> >> next dose 6/25 >> Spoke to Rheum -Myrtie HawkDefer plasma exchange for now >> having some clinical improvement with above therapies Add atovaquone 6/21 for PCP prophylaxis (Sulfa allergy) F/u CXR Oxygen to keep SpO2 > 92% Defer lung biopsy for now >> if no further improvement with therapy, then may need lung tissue sampling Defer Abx for now Goal even to negative fluid balance  Protein and Hb in U/A on admission. Plan: Monitor renal f/x  Hx of GERD. Plan:  Carb modified diet while on high dose steroids Protonix  PRN reglan  Anemia associated with alveolar hemorrhage. Leukocytosis >> likely from steroids. Plan:  F/u CBC Transfuse for Hb <  7 SCD for DVT prevention  Steroid induced hyperglycemia  Plan:  SSI    Anxiety. Plan:  Continue bupropion, celexa PRN xanax  Updated sister & mother at bedside CC time 368minutes   RKara MeadMD. FCCP. [place] Pager [phone] no response call [phone]   06/30/2013, 9:55 AM

## 2013-06-30 NOTE — Progress Notes (Signed)
CARE MANAGEMENT NOTE 06/30/2013  Patient:  [name], [name]   Account Number:  [id number]  Date Initiated:  06/24/2013  Documentation initiated by:  DAVIS,RHONDA  Subjective/Objective Assessment:   pt with resp distress intubated on [id number] extubate to 40%fi02 on [id number]     Action/Plan:   TBD based on progress and progression of resp status.   Anticipated DC Date:  06/30/2013   Anticipated DC Plan:  HOME/SELF CARE  In-house referral  NA      DC Planning Services  NA      [facility] Choice  NA   Choice offered to / List presented to:  NA   DME arranged  NA      DME agency  NA     [place] arranged  NA      [place] agency  NA   Status of service:  In process, will continue to follow Medicare Important Message given?  NA - LOS <3 / Initial given by admissions (If response is "NO", the following Medicare IM given date fields will be blank) Date Medicare IM given:   Date Additional Medicare IM given:    Discharge Disposition:    Per UR Regulation:  Reviewed for med. necessity/level of care/duration of stay  If discussed at [place] of Stay Meetings, dates discussed:    Comments:  [id number]/Rhonda Eldridge Dace, [place], [place] [phone] Chart Reviewed for discharge and hospital needs. Discharge needs at time of review: None present will follow for needs. Review of patient progress due on [id number].  [id number] Rosana Hoes, RN, BSN, CCM: CHART REVIEWED AND UPDATED.  Next chart review due on [id number]. NO DISCHARGE NEEDS PRESENT AT THIS TIME WILL CONTINUE TO FOLLOW.  o2 at 6l/min,iv steroids and bronchodilators. CASE MANAGEMENT [phone]   [id number] Eldridge Dace, BSN, [place] [phone] Chart Reviewed for discharge and hospital needs. Discharge needs at time of review: None present will follow for needs. Review of patient progress due on [id number].

## 2013-07-01 DIAGNOSIS — D509 Iron deficiency anemia, unspecified: Secondary | ICD-10-CM

## 2013-07-01 LAB — GLUCOSE, CAPILLARY
Glucose-Capillary: 137 mg/dL — ABNORMAL HIGH (ref 70–99)
Glucose-Capillary: 165 mg/dL — ABNORMAL HIGH (ref 70–99)
Glucose-Capillary: 166 mg/dL — ABNORMAL HIGH (ref 70–99)
Glucose-Capillary: 255 mg/dL — ABNORMAL HIGH (ref 70–99)

## 2013-07-01 MED ORDER — METOCLOPRAMIDE HCL 5 MG/ML IJ SOLN
10.0000 mg | Freq: Three times a day (TID) | INTRAMUSCULAR | Status: DC | PRN
  Administered 2013-07-01 – 2013-07-02 (×2): 10 mg via INTRAVENOUS
  Filled 2013-07-01 (×3): qty 2

## 2013-07-01 MED ORDER — ACETAMINOPHEN 325 MG PO TABS
650.0000 mg | ORAL_TABLET | Freq: Once | ORAL | Status: AC
  Administered 2013-07-01: 650 mg via ORAL
  Filled 2013-07-01: qty 2

## 2013-07-01 MED ORDER — SODIUM CHLORIDE 0.9 % IV SOLN
125.0000 mg | Freq: Once | INTRAVENOUS | Status: AC
  Administered 2013-07-02: 125 mg via INTRAVENOUS
  Filled 2013-07-01: qty 10

## 2013-07-01 MED ORDER — DIPHENHYDRAMINE HCL 50 MG/ML IJ SOLN
50.0000 mg | Freq: Once | INTRAMUSCULAR | Status: AC
  Administered 2013-07-01: 50 mg via INTRAVENOUS
  Filled 2013-07-01: qty 1

## 2013-07-01 MED ORDER — SODIUM CHLORIDE 0.9 % IV SOLN
375.0000 mg/m2 | Freq: Once | INTRAVENOUS | Status: AC
  Administered 2013-07-01: 1000 mg via INTRAVENOUS
  Filled 2013-07-01: qty 100

## 2013-07-01 NOTE — Progress Notes (Signed)
Pt was having pain crisis when I arrived; told staff "50" out of 10. Provided calming presence, supportive words until staff arrived w/pain meds. Pt looked at her numbers; nurse assured her numbers were ok. Pt's sister was bedside and said pt was mad at her. Sister continued to provided presence and care to pt. After pain meds were administered, pt was calmer and appreciative of pastoral care. Ernest Haber Chaplain  07/01/13 1300  Clinical Encounter Type  Visited With Patient and family together

## 2013-07-01 NOTE — Progress Notes (Signed)
[place] Progress Note Patient Name: [name] DOB: [birth year] MRN: [id number]  Date of Service  07/01/2013   HPI/Events of Note  Nausea despite reglan 5mg  IV   eICU Interventions  Try 10mg  reglan IV q8h prn   Intervention Category Intermediate Interventions: Other:  RAMASWAMY,MURALI 07/01/2013, 8:56 PM

## 2013-07-01 NOTE — Progress Notes (Signed)
Stopped by for follow-up. Pt said she was feeling btr and expecting treatment later. She apologized (again) for earlier outburst. I tried to assure her she was fine. Pt's sister was reclining bedside.  Pt was very thankful for visit. Ernest Haber Chaplain  07/01/13 1400  Clinical Encounter Type  Visited With Patient

## 2013-07-01 NOTE — Progress Notes (Signed)
PULMONARY / CRITICAL CARE MEDICINE   Name: [name] MRN: [id number] DOB: 21-Jun-1960    ADMISSION DATE:  06/23/2013 CONSULTATION DATE:  06/23/2013  REFERRING MD : Doyle Askew   CHIEF COMPLAINT: Shortness of breath, hemoptysis, diffuse alveolar hemorrhage   BRIEF PATIENT DESCRIPTION:  53 yo female former smoker presented with cough, hemoptysis for 2 days.  Found to have hypoxia and diffuse pulmonary infiltrates.  PCCM assumed care in ICU.  SIGNIFICANT EVENTS: 6/15 Admit, PCCM consulted  6/16 Start high dose steroids, Transfuse PRBC 6/17 Start weekly Rituxan  6/18 Hematology consulted to assess for plasma exchange >> not indicated at this time 6/21 Transition to prednisone  STUDIES: 6/15 CT chest >> diffuse b/l GGO 6/15 P ANCA 1:640, Myeloperoxidase > 8, HIV non reactive, Aldolase 4, CPK 31, Scl 70 < 1, anti GBM < 1, CCP < 2, RF < 10, ds DNA < 1, ANA negative, ESR 90, CRP 22, SSA < 1, SSB < 1 6/15 U/A >> 100 protein, large Hgb, moderate leukocytes 6/16 Echo >> EF 55 to 60% 6/16 Lupus anticoagulant dectected 6/17 Hep B IgM negative, Hep B core Ab negative, Hep B surface Ag negative, Hep B surface Ab negative, Hep C Ab negative 6/17 G6PD >> 22.8 6/22 desatn off NRB  LINES / TUBES:  Rt PICC 6/17 >>   CULTURES:  Respiratory viral panel 6/15 >> negative Legionella 6/15 >> negative Blood 6/15 >> ng  ANTIBIOTICS:  Levaquin 6/15 >>> 6/17 Ceftriaxone 6/15 >>> one dose 6/15  Azithromycin 6/15 >>> one dose 6/15   SUBJECTIVE:  Continues to Desat easily when off  NRB High level of anxiety Afebrile Good UO  VITAL SIGNS: Temp:  [97.7 F (36.5 C)-99 F (37.2 C)] 97.7 F (36.5 C) (06/23 0800) Pulse Rate:  [74-98] 74 (06/23 0605) Resp:  [18-32] 22 (06/23 0605) BP: (91-192)/(54-164) 152/84 mmHg (06/23 0600) SpO2:  [68 %-99 %] 97 % (06/23 0605) Weight:  [148.7 kg (327 lb 13.2 oz)] 148.7 kg (327 lb 13.2 oz) (06/23 0400) INTAKE / OUTPUT: Intake/Output     06/22 0701 - 06/23 0700  06/23 0701 - 06/24 0700   P.O. 1920    I.V. (mL/kg) 209 (1.4)    Total Intake(mL/kg) 2129 (14.3)    Urine (mL/kg/hr) 4600 (1.3)    Total Output 4600     Net -2471           PHYSICAL EXAMINATION: General: Speaks in full sentences, wearing nasal cannula and NRB Neuro: alert, normal strength HEENT: pupils reactive Cardiovascular: regular, no murmur Lungs: diffuse b/l crackles Abdomen: soft, non tender Musculoskeletal: no edema Skin: no rashes  LABS:  CBC  Recent Labs Lab 06/28/13 0400 06/28/13 1200 06/30/13 0515  WBC 17.0* 17.2* 18.2*  HGB 7.3* 7.5* 7.3*  HCT 23.8* 23.7* 23.4*  PLT 356 340 302   Coag's No results found for this basename: APTT, INR,  in the last 168 hours BMET  Recent Labs Lab 06/28/13 0358 06/29/13 0520 06/30/13 0515  NA 140 139 141  K 4.0 4.0 3.9  CL 102 101 103  CO2 _0 BUN 30* 28* 27*  CREATININE 0.76 0.70 0.74  GLUCOSE 157* 166* 129*   Electrolytes  Recent Labs Lab 06/26/13 0520  06/28/13 0358 06/29/13 0520 06/30/13 0515  CALCIUM  --   < > 8.5 8.4 8.1*  MG 2.2  --  2.4 2.4  --   PHOS 2.7  --  3.0 3.5  --   < > = values  in this interval not displayed.  Sepsis Markers  Recent Labs Lab 06/25/13 0318  PROCALCITON <0.10   ABG No results found for this basename: PHART, PCO2ART, PO2ART,  in the last 168 hours Liver Enzymes  Recent Labs Lab 06/27/13 0600 06/28/13 0358 06/29/13 0520  AST _0 ALT 32 30 26  ALKPHOS 114 111 98  BILITOT 0.5 0.6 0.6  ALBUMIN 2.6* 2.7* 2.6*   Cardiac Enzymes No results found for this basename: TROPONINI, PROBNP,  in the last 168 hours Glucose  Recent Labs Lab 06/29/13 2253 06/30/13 0748 06/30/13 1203 06/30/13 1612 06/30/13 2117 07/01/13 0815  GLUCAP 142* 116* 153* 193* 219* 137*    Imaging Dg Chest Port 1 View  06/30/2013   CLINICAL DATA:  Pulmonary infiltrates.  EXAM: PORTABLE CHEST - 1 VIEW  COMPARISON:  June 29, 2013.  FINDINGS: Stable cardiomediastinal silhouette.  Right-sided PICC line is again noted and unchanged in position with tip in expected position of the SVC. No pneumothorax or large pleural effusion is noted. Stable severe bilateral diffuse airspace opacities are noted which are unchanged compared to prior exam. Cardiomediastinal silhouette appears normal.  IMPRESSION: Stable severe diffuse airspace opacities are noted concerning for edema or pneumonia.   Electronically Signed   By: Sabino Dick M.D.   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 Plan:  ct solumedrol 60 q 12h (55m/kg) Give second dose of  weekly rituxan on 6/17 >> she got375 mg/m2 weekly >> > Spoke to Rheum -[name] 6/22 Defer plasma exchange unless clinical deterioration Add atovaquone 6/21 for PCP prophylaxis (Sulfa allergy) F/u CXR intermittent Oxygen to keep SpO2 > 92% Defer lung biopsy for now  Goal even to negative fluid balance  Protein and Hb in U/A on admission. Plan: Monitor renal f/x  Hx of GERD. Plan:  Carb modified diet while on high dose steroids Protonix  PRN reglan  Anemia associated with alveolar hemorrhage. Leukocytosis >> likely from steroids. Plan:  F/u CBC Transfuse for Hb < 7, OK to dose rituxan inspite of anemia  SCD for DVT prevention  Steroid induced hyperglycemia  Plan:  SSI    Anxiety. Plan:  Continue bupropion, celexa PRN xanax  Updated patient  Care during the described time interval was provided by me and/or other providers on the critical care team.  I have reviewed this patient's available data, including medical history, events of note, physical examination and test results as part of my evaluation  CC time x 35 m   RKara MeadMD. FShade [facility] Pager [phone] no response call [phone]   07/01/2013, 10:02 AM

## 2013-07-01 NOTE — Progress Notes (Signed)
[name]   DOB:[birth year]   SU#:[id number]   KFE#:[id number]  Subjective:  [name] continue to desat easily when she gets anxiety.  She says her breathing is unchanged from yesterday She was planned to have Rituxan today  Objective: Patient is obese and well-nourished Filed Vitals:   07/01/13 2000  BP: 144/76  Pulse: 83  Temp:   Resp: 23    Body mass index is 48.39 kg/(m^2).  Intake/Output Summary (Last 24 hours) at 07/01/13 2107 Last data filed at 07/01/13 2000  Gross per 24 hour  Intake 2120.31 ml  Output   4200 ml  Net -2079.69 ml   HEENT: PERRLA, sclerae anicteric, conjunctiva no pallor               Oropharynx clear  No peripheral adenopathy  Lungs clear -- bibasilar  rales  Heart regular rate and rhythm  Abdomen benign, no masses appreciated  MSK no focal spinal tenderness, no peripheral edema  Neuro nonfocal          Skin: intact    Recent Labs  07/01/13 0815 07/01/13 1140 07/01/13 1702  GLUCAP 137* 165* 166*     Labs:  Lab Results  Component Value Date   WBC 18.2* 06/30/2013   HGB 7.3* 06/30/2013   HCT 23.4* 06/30/2013   MCV 79.9 06/30/2013   PLT 302 06/30/2013   NEUTROABS 15.9* 06/28/2013    '@LASTCHEMISTRY' @  Urine Studies No results found for this basename: UACOL, UAPR, USPG, UPH, UTP, UGL, UKET, UBIL, UHGB, UNIT, UROB, ULEU, UEPI, UWBC, URBC, UBAC, CAST, CRYS, UCOM, BILUA,  in the last 72 hours  Basic Metabolic Panel:  Recent Labs Lab 06/25/13 0318  06/26/13 0520 06/26/13 1225 06/27/13 0600 06/28/13 0358 06/29/13 0520 06/30/13 0515  NA 140  < >  --  141 140 140 139 141  K 4.0  < >  --  4.1 4.1 4.0 4.0 3.9  CL 100  < >  --  103 103 102 101 103  CO2 27  < >  --  '26 26 27 27 29  ' GLUCOSE 157*  < >  --  141* 176* 157* 166* 129*  BUN 16  < >  --  27* 30* 30* 28* 27*  CREATININE 0.83  < >  --  0.82 0.86 0.76 0.70 0.74  CALCIUM 9.0  < >  --  8.7 8.5 8.5 8.4 8.1*  MG 2.0  --  2.2  --   --  2.4 2.4  --   PHOS 3.0  --  2.7  --   --  3.0  3.5  --   < > = values in this interval not displayed. GFR Estimated Creatinine Clearance: 128.8 ml/min (by C-G formula based on Cr of 0.74). Liver Function Tests:  Recent Labs Lab 06/26/13 1225 06/27/13 0600 06/28/13 0358 06/29/13 0520  AST '31 24 20 19  ' ALT 30 32 30 26  ALKPHOS 126* 114 111 98  BILITOT 0.6 0.5 0.6 0.6  PROT 7.1 6.5 6.3 5.9*  ALBUMIN 2.8* 2.6* 2.7* 2.6*   No results found for this basename: LIPASE, AMYLASE,  in the last 168 hours No results found for this basename: AMMONIA,  in the last 168 hours Coagulation profile No results found for this basename: INR, PROTIME,  in the last 168 hours  CBC:  Recent Labs Lab 06/26/13 0520 06/27/13 0600 06/28/13 0400 06/28/13 1200 06/30/13 0515  WBC 20.7* 16.1* 17.0* 17.2* 18.2*  NEUTROABS 19.7*  --   --  15.9*  --   HGB 7.2* 7.2* 7.3* 7.5* 7.3*  HCT 23.6* 23.3* 23.8* 23.7* 23.4*  MCV 78.9 79.5 79.9 78.5 79.9  PLT 362 325 356 340 302   Cardiac Enzymes: No results found for this basename: CKTOTAL, CKMB, CKMBINDEX, TROPONINI,  in the last 168 hours BNP: No components found with this basename: POCBNP,  CBG:  Recent Labs Lab 06/30/13 1612 06/30/13 2117 07/01/13 0815 07/01/13 1140 07/01/13 1702  GLUCAP 193* 219* 137* 165* 166*   D-Dimer No results found for this basename: DDIMER,  in the last 72 hours Hgb A1c No results found for this basename: HGBA1C,  in the last 72 hours Lipid Profile No results found for this basename: CHOL, HDL, LDLCALC, TRIG, CHOLHDL, LDLDIRECT,  in the last 72 hours Thyroid function studies No results found for this basename: TSH, T4TOTAL, FREET3, T3FREE, THYROIDAB,  in the last 72 hours Anemia work up No results found for this basename: VITAMINB12, FOLATE, FERRITIN, TIBC, IRON, RETICCTPCT,  in the last 72 hours Microbiology Recent Results (from the past 240 hour(s))  CULTURE, BLOOD (ROUTINE X 2)     Status: None   Collection Time    06/23/13  6:41 PM      Result Value Ref  Range Status   Specimen Description BLOOD LEFT ANTECUBITAL   Final   Special Requests BOTTLES DRAWN AEROBIC AND ANAEROBIC 3ML   Final   Culture  Setup Time     Final   Value: 06/23/2013 23:34     Performed at [place]     Final   Value: NO GROWTH 5 DAYS     Performed at [facility]   Report Status 06/29/2013 FINAL   Final  CULTURE, BLOOD (ROUTINE X 2)     Status: None   Collection Time    06/23/13  6:41 PM      Result Value Ref Range Status   Specimen Description BLOOD LEFT HAND   Final   Special Requests BOTTLES DRAWN AEROBIC AND ANAEROBIC 3ML   Final   Culture  Setup Time     Final   Value: 06/23/2013 23:34     Performed at [facility]   Culture     Final   Value: NO GROWTH 5 DAYS     Performed at [facility]   Report Status 06/29/2013 FINAL   Final  MRSA PCR SCREENING     Status: None   Collection Time    06/23/13 10:23 PM      Result Value Ref Range Status   MRSA by PCR NEGATIVE  NEGATIVE Final   Comment:            The GeneXpert MRSA Assay (FDA     approved for NASAL specimens     only), is one component of a     comprehensive MRSA colonization     surveillance program. It is not     intended to diagnose MRSA     infection nor to guide or     monitor treatment for     MRSA infections.  RESPIRATORY VIRUS PANEL     Status: None   Collection Time    06/23/13 10:51 PM      Result Value Ref Range Status   Source - RVPAN NOSE   Final   Respiratory Syncytial Virus A NOT DETECTED   Final   Respiratory Syncytial Virus B NOT DETECTED   Final   Influenza A NOT  DETECTED   Final   Influenza B NOT DETECTED   Final   Parainfluenza 1 NOT DETECTED   Final   Parainfluenza 2 NOT DETECTED   Final   Parainfluenza 3 NOT DETECTED   Final   Metapneumovirus NOT DETECTED   Final   Rhinovirus NOT DETECTED   Final   Adenovirus NOT DETECTED   Final   Influenza A H1 NOT DETECTED   Final   Influenza A H3 NOT DETECTED   Final   Comment:  (NOTE)           Normal Reference Range for each Analyte: NOT DETECTED     Testing performed using the Luminex xTAG Respiratory Viral Panel test     kit.     This test was developed and its performance characteristics determined     by [facility]. It has not been cleared or approved by the [place]     Food and Drug Administration. This test is used for clinical purposes.     It should not be regarded as investigational or for research. This     laboratory is certified under the [place] (CLIA) as qualified to perform high complexity     clinical laboratory testing.     Performed at [facility]      Studies:  Dg Chest Port 1 View  06/30/2013   CLINICAL DATA:  Pulmonary infiltrates.  EXAM: PORTABLE CHEST - 1 VIEW  COMPARISON:  June 29, 2013.  FINDINGS: Stable cardiomediastinal silhouette. Right-sided PICC line is again noted and unchanged in position with tip in expected position of the SVC. No pneumothorax or large pleural effusion is noted. Stable severe bilateral diffuse airspace opacities are noted which are unchanged compared to prior exam. Cardiomediastinal silhouette appears normal.  IMPRESSION: Stable severe diffuse airspace opacities are noted concerning for edema or pneumonia.   Electronically Signed   By: Sabino Dick M.D.   On: 06/30/2013 07:02    IMPRESSION/RECOMMENDATIONS:  53 y.o. old pleasant female with history of diverticulitis with sigmoid colon perforation, obesity, GERD, chronic constipation was admitted on 06/23/2013 with acute hypoxia with respiratory failure due to diffuse alveolar hemorrhage .She was positive for ANCA vasculitis.  She was started on high-dose steroids on 06/24/2013 and first dose of Rituxan was initiated 06/25/2013. During the hospital course she was transfused with 1 unit of packed RBCs and her hemoglobin and hematocrit is stable at this time. Her chest x-ray stable.  I have discussed in  detail patient's condition with Dr. Beryle Beams on 06/27/2013 and hematology perspective, no indication for TPE at this time. Continue steroids and rituxan as scheduled as per ICU team.   Her anemia workup revealed mild iron deficiency. Spoke with ICU team and will initiate IV venofer 200 mg x one dose in AM. Continue folic acid 5 mg by mouth once daily. Her hemoglobin and hematocrit is stable at this time.  Please call us with any questions or concerns at Royal, Percy, MD Medical oncology 07/01/2013  9:07 PM

## 2013-07-02 ENCOUNTER — Inpatient Hospital Stay (HOSPITAL_COMMUNITY): Payer: 59

## 2013-07-02 LAB — GLUCOSE, CAPILLARY
Glucose-Capillary: 119 mg/dL — ABNORMAL HIGH (ref 70–99)
Glucose-Capillary: 186 mg/dL — ABNORMAL HIGH (ref 70–99)
Glucose-Capillary: 132 mg/dL — ABNORMAL HIGH (ref 70–99)

## 2013-07-02 LAB — BASIC METABOLIC PANEL
BUN: 22 mg/dL (ref 6–23)
CALCIUM: 8.2 mg/dL — AB (ref 8.4–10.5)
CO2: 29 mEq/L (ref 19–32)
Chloride: 99 mEq/L (ref 96–112)
Creatinine, Ser: 0.69 mg/dL (ref 0.50–1.10)
GFR calc Af Amer: >90 mL/min (ref >90)
GFR calc non Af Amer: >90 mL/min (ref >90)
Glucose, Bld: 156 mg/dL — ABNORMAL HIGH (ref 70–99)
Potassium: 4.4 mEq/L (ref 3.7–5.3)
SODIUM: 139 meq/L (ref 137–147)

## 2013-07-02 LAB — CBC
HCT: 23.2 % — ABNORMAL LOW (ref 36.0–46.0)
HEMOGLOBIN: 7.1 g/dL — AB (ref 12.0–15.0)
MCH: 24.4 pg — AB (ref 26.0–34.0)
MCHC: 30.6 g/dL (ref 30.0–36.0)
MCV: 79.7 fL (ref 78.0–100.0)
PLATELETS: 341 10*3/uL (ref 150–400)
RBC: 2.91 MIL/uL — AB (ref 3.87–5.11)
RDW: 15.9 % — ABNORMAL HIGH (ref 11.5–15.5)
WBC: 24.6 10*3/uL — ABNORMAL HIGH (ref 4.0–10.5)

## 2013-07-02 MED ORDER — ONDANSETRON HCL 4 MG/2ML IJ SOLN
4.0000 mg | Freq: Four times a day (QID) | INTRAMUSCULAR | Status: DC | PRN
  Administered 2013-07-06 – 2013-07-21 (×5): 4 mg via INTRAVENOUS
  Filled 2013-07-02 (×6): qty 2

## 2013-07-02 MED ORDER — BIOTENE DRY MOUTH MT LIQD
15.0000 mL | Freq: Two times a day (BID) | OROMUCOSAL | Status: DC
  Administered 2013-07-02: 15 mL via OROMUCOSAL

## 2013-07-02 MED ORDER — ONDANSETRON HCL 4 MG/2ML IJ SOLN: 4.0000 mg | Freq: Four times a day (QID) | INTRAMUSCULAR | Status: DC | PRN

## 2013-07-02 NOTE — Progress Notes (Signed)
PULMONARY / CRITICAL CARE MEDICINE   Name: [name] MRN: [id number] DOB: 07/11/1960    ADMISSION DATE:  06/23/2013 CONSULTATION DATE:  06/23/2013  REFERRING MD : Myers   CHIEF COMPLAINT: Shortness of breath, hemoptysis, diffuse alveolar hemorrhage   BRIEF PATIENT DESCRIPTION:  53 yo female former smoker presented with cough, hemoptysis for 2 days.  Found to have hypoxia and diffuse pulmonary infiltrates.  Serology showed P ANCA 1:640, Myeloperoxidase > 8 . PCCM assumed care in ICU.  SIGNIFICANT EVENTS: 6/15 Admit, PCCM consulted  6/16 Start high dose steroids, Transfuse PRBC 6/17 Start weekly Rituxan  6/18 Hematology consulted to assess for plasma exchange >> not indicated at this time 6/21 Transition to prednisone 6/23 second dose of  weekly rituxan   STUDIES: 6/15 CT chest >> diffuse b/l GGO 6/15 P ANCA 1:640, Myeloperoxidase > 8, HIV non reactive, Aldolase 4, CPK 31, Scl 70 < 1, anti GBM < 1, CCP < 2, RF < 10, ds DNA < 1, ANA negative, ESR 90, CRP 22, SSA < 1, SSB < 1 6/15 U/A >> 100 protein, large Hgb, moderate leukocytes 6/16 Echo >> EF 55 to 60% 6/16 Lupus anticoagulant dectected 6/17 Hep B IgM negative, Hep B core Ab negative, Hep B surface Ag negative, Hep B surface Ab negative, Hep C Ab negative 6/17 G6PD >> 22.8 6/22 desatn off NRB  LINES / TUBES:  Rt PICC 6/17 >>   CULTURES:  Respiratory viral panel 6/15 >> negative Legionella 6/15 >> negative Blood 6/15 >> ng  ANTIBIOTICS:  Levaquin 6/15 >>> 6/17 Ceftriaxone 6/15 >>> one dose 6/15  Azithromycin 6/15 >>> one dose 6/15   SUBJECTIVE:  Continues to Desat easily when off  NRB High level of anxiety continues Afebrile Good UO Sugars high  VITAL SIGNS: Temp:  [97.8 F (36.6 C)-98.7 F (37.1 C)] 97.8 F (36.6 C) (06/24 0800) Pulse Rate:  [76-92] 92 (06/24 0800) Resp:  [20-24] 24 (06/24 0800) BP: (99-151)/(55-103) 99/65 mmHg (06/24 0800) SpO2:  [96 %-100 %] 96 % (06/24 0800) FiO2 (%):  [100 %] 100  % (06/24 0800) Weight:  [144.2 kg (317 lb 14.5 oz)] 144.2 kg (317 lb 14.5 oz) (06/24 0400) INTAKE / OUTPUT: Intake/Output     06/23 0701 - 06/24 0700 06/24 0701 - 06/25 0700   P.O. 1440    I.V. (mL/kg) 240 (1.7)    IV Piggyback 87.5    Total Intake(mL/kg) 1767.5 (12.3)    Urine (mL/kg/hr) 3875 (1.1)    Total Output 3875     Net -2107.5          Stool Occurrence 2 x     PHYSICAL EXAMINATION: General: Speaks in full sentences, wearing nasal cannula and NRB Neuro: alert, normal strength HEENT: pupils reactive Cardiovascular: regular, no murmur Lungs: diffuse b/l crackles Abdomen: soft, non tender Musculoskeletal: no edema Skin: no rashes  LABS:  CBC  Recent Labs Lab 06/28/13 1200 06/30/13 0515 07/02/13 0404  WBC 17.2* 18.2* 24.6*  HGB 7.5* 7.3* 7.1*  HCT 23.7* 23.4* 23.2*  PLT 340 302 341   Coag's No results found for this basename: APTT, INR,  in the last 168 hours BMET  Recent Labs Lab 06/29/13 0520 06/30/13 0515 07/02/13 0404  NA 139 141 139  K 4.0 3.9 4.4  CL 101 103 99  CO2 27 29 29  BUN 28* 27* 22  CREATININE 0.70 0.74 0.69  GLUCOSE 166* 129* 156*   Electrolytes  Recent Labs Lab 06/26/13 0520  06/28/13 0358   06/29/13 0520 06/30/13 0515 07/02/13 0404  CALCIUM  --   < > 8.5 8.4 8.1* 8.2*  MG 2.2  --  2.4 2.4  --   --   PHOS 2.7  --  3.0 3.5  --   --   < > = values in this interval not displayed.  Sepsis Markers No results found for this basename: LATICACIDVEN, PROCALCITON, O2SATVEN,  in the last 168 hours ABG No results found for this basename: PHART, PCO2ART, PO2ART,  in the last 168 hours Liver Enzymes  Recent Labs Lab 06/27/13 0600 06/28/13 0358 06/29/13 0520  AST 24 20 19  ALT 32 30 26  ALKPHOS 114 111 98  BILITOT 0.5 0.6 0.6  ALBUMIN 2.6* 2.7* 2.6*   Cardiac Enzymes No results found for this basename: TROPONINI, PROBNP,  in the last 168 hours Glucose  Recent Labs Lab 06/30/13 2117 07/01/13 0815 07/01/13 1140  07/01/13 1702 07/01/13 2118 07/02/13 0735  GLUCAP 219* 137* 165* 166* 255* 119*    Imaging Dg Chest Port 1 View  07/02/2013   CLINICAL DATA:  Pneumonitis  EXAM: PORTABLE CHEST - 1 VIEW  COMPARISON:  06/30/2013  FINDINGS: Severe bilateral airspace disease shows mild interval improvement. No significant effusion. PICC tip in the SVC.  IMPRESSION: Mild improvement in diffuse bilateral airspace opacities.   Electronically Signed   By: Charles  Clark M.D.   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 Plan:  ct solumedrol 60 q 12h (1mg/kg) S/p 2 doses rituxan  >> she got375 mg/m2 weekly >> > Spoke to Rheum -Andersen on 6/22 Defer plasma exchange unless clinical deterioration ct atovaquone 6/21 for PCP prophylaxis (Sulfa allergy) F/u CXR intermittent Oxygen to keep SpO2 > 92% Would not tolerate lung biopsy  Goal even to negative fluid balance  Protein and Hb in U/A on admission. Plan: Monitor renal f/x  Hx of GERD. Plan:  Carb modified diet while on high dose steroids Protonix  PRN reglan  Anemia associated with alveolar hemorrhage. Leukocytosis >> likely from steroids. Plan:  F/u CBC Transfuse for Hb < 7 SCD for DVT prevention  Steroid induced hyperglycemia  Plan:  SSI    Anxiety. Plan:  Continue bupropion, celexa PRN xanax  Updated patient, attempt to mobilise today  Care during the described time interval was provided by me and/or other providers on the critical care team.  I have reviewed this patient's available data, including medical history, events of note, physical examination and test results as part of my evaluation  CC time x 31 m   Rakesh Alva MD. FCCP. [place] Pager [phone] If no response call [phone]   07/02/2013, 9:23 AM             

## 2013-07-02 NOTE — Progress Notes (Signed)
Patient up to the bedside commode. Tolerated well.  O2 sats remained above 90%.

## 2013-07-03 LAB — CBC
HCT: 24.3 % — ABNORMAL LOW (ref 36.0–46.0)
Hemoglobin: 7.6 g/dL — ABNORMAL LOW (ref 12.0–15.0)
MCH: 25.4 pg — AB (ref 26.0–34.0)
MCHC: 31.3 g/dL (ref 30.0–36.0)
MCV: 81.3 fL (ref 78.0–100.0)
Platelets: 342 10*3/uL (ref 150–400)
RBC: 2.99 MIL/uL — ABNORMAL LOW (ref 3.87–5.11)
RDW: 16 % — AB (ref 11.5–15.5)
WBC: 25.8 10*3/uL — ABNORMAL HIGH (ref 4.0–10.5)

## 2013-07-03 LAB — BASIC METABOLIC PANEL
BUN: 22 mg/dL (ref 6–23)
CALCIUM: 8.7 mg/dL (ref 8.4–10.5)
CO2: 32 meq/L (ref 19–32)
CREATININE: 0.66 mg/dL (ref 0.50–1.10)
Chloride: 98 mEq/L (ref 96–112)
GFR calc Af Amer: >90 mL/min (ref >90)
GFR calc non Af Amer: >90 mL/min (ref >90)
GLUCOSE: 112 mg/dL — AB (ref 70–99)
Potassium: 4.3 mEq/L (ref 3.7–5.3)
Sodium: 139 mEq/L (ref 137–147)

## 2013-07-03 LAB — GLUCOSE, CAPILLARY
GLUCOSE-CAPILLARY: 181 mg/dL — AB (ref 70–99)
GLUCOSE-CAPILLARY: 144 mg/dL — AB (ref 70–99)
Glucose-Capillary: 122 mg/dL — ABNORMAL HIGH (ref 70–99)
Glucose-Capillary: 203 mg/dL — ABNORMAL HIGH (ref 70–99)

## 2013-07-03 MED ORDER — BIOTENE DRY MOUTH MT LIQD
15.0000 mL | Freq: Two times a day (BID) | OROMUCOSAL | Status: DC
  Administered 2013-07-03 – 2013-07-04 (×3): 15 mL via OROMUCOSAL

## 2013-07-03 MED ORDER — CHLORHEXIDINE GLUCONATE 0.12 % MT SOLN
15.0000 mL | Freq: Two times a day (BID) | OROMUCOSAL | Status: DC
  Administered 2013-07-03 – 2013-07-05 (×2): 15 mL via OROMUCOSAL
  Filled 2013-07-03 (×6): qty 15

## 2013-07-03 MED ORDER — ALTEPLASE 2 MG IJ SOLR
2.0000 mg | Freq: Once | INTRAMUSCULAR | Status: AC
  Administered 2013-07-03: 2 mg
  Filled 2013-07-03: qty 2

## 2013-07-03 NOTE — Evaluation (Signed)
Physical Therapy Evaluation Patient Details Name: [name] MRN: [id number] DOB: 1960/09/02 Today's Date: 07/03/2013   History of Present Illness  53 yo female admited 06/23/13 withShortness of breath, hemoptysis, diffuse alveolar hemorrhage . Pt has  been in respiratory distress.   Clinical Impression  Pt is very anxious, requires freequent cues to concentrate on breathing.Pt maintained  sats > 88% on NRB mask during moblity. Pt will benefit from PT to address problems listed in note below.     Follow Up Recommendations SNF;Supervision/Assistance - 24 hour    Equipment Recommendations  None recommended by PT    Recommendations for Other Services OT consult     Precautions / Restrictions Precautions Precaution Comments: chemo, monitor sats, on NRB      Mobility  Bed Mobility Overal bed mobility: Needs Assistance;+ 2 for safety/equipment Bed Mobility: Supine to Sit     Supine to sit: +2 for physical assistance;+2 for safety/equipment;Mod assist;HOB elevated     General bed mobility comments: support  of trunk to get to upright. Extra time for rest breaks  Transfers Overall transfer level: Needs assistance Equipment used: Rolling walker (2 wheeled) Transfers: Sit to/from [facility] Sit to Stand: Mod assist;+2 physical assistance;+2 safety/equipment Stand pivot transfers: Mod assist;+2 physical assistance;+2 safety/equipment       General transfer comment: extra time and extra tries to stand from be. . Once up able to take small pivot steps to recliner.  On NRB mask  Ambulation/Gait                Stairs            Wheelchair Mobility    Modified Rankin (Stroke Patients Only)       Balance Overall balance assessment: Needs assistance Sitting-balance support: No upper extremity supported;Feet supported Sitting balance-Leahy Scale: Fair                                       Pertinent Vitals/Pain NRB sats  >88%, RR, 30's, HR 110    Home Living Family/patient expects to be discharged to:: Private residence Living Arrangements: Spouse/significant other Available Help at Discharge: Family Type of Home: Mobile home Home Access: Stairs to enter   Technical brewer of Steps: 4-5 Home Layout: One level Home Equipment: Environmental consultant - 2 wheels      Prior Function Level of Independence: Independent               Hand Dominance        Extremity/Trunk Assessment   Upper Extremity Assessment: Overall WFL for tasks assessed           Lower Extremity Assessment: Generalized weakness      Cervical / Trunk Assessment: Normal  Communication   Communication:  (frequent rest breaks  for Dyspnea)  Cognition Arousal/Alertness: Awake/alert Behavior During Therapy: Anxious;Restless Overall Cognitive Status: Within Functional Limits for tasks assessed                      General Comments      Exercises        Assessment/Plan    PT Assessment Patient needs continued PT services  PT Diagnosis Difficulty walking;Generalized weakness (DOE)   PT Problem List Decreased strength;Decreased activity tolerance;Decreased mobility;Decreased knowledge of precautions;Decreased knowledge of use of DME;Decreased safety awareness;Cardiopulmonary status limiting activity  PT Treatment Interventions DME instruction;Gait training;Functional mobility training;Therapeutic activities;Therapeutic exercise;Stair training  PT Goals (Current goals can be found in the Care Plan section) Acute Rehab PT Goals Patient Stated Goal: I want to go home. not use walker. PT Goal Formulation: With patient Time For Goal Achievement: 07/17/13 Potential to Achieve Goals: Good    Frequency Min 3X/week   Barriers to discharge        Co-evaluation               End of Session   Activity Tolerance: Treatment limited secondary to medical complications (Comment) (respiratory status/ DOE) Patient  left: in chair;with call bell/phone within reach Nurse Communication: Mobility status         Time: 1315-1336 PT Time Calculation (min): 21 min   Charges:   PT Evaluation $Initial PT Evaluation Tier I: 1 Procedure PT Treatments $Therapeutic Activity: 8-22 mins   PT G Codes:          Claretha Cooper 07/03/2013, 3:36 PM Tresa Endo PT [phone]

## 2013-07-03 NOTE — Progress Notes (Signed)
PULMONARY / CRITICAL CARE MEDICINE   Name: [name] MRN: [id number] DOB: 10/21/1960    ADMISSION DATE:  06/23/2013 CONSULTATION DATE:  06/23/2013  REFERRING MD : Doyle Askew   CHIEF COMPLAINT: Shortness of breath, hemoptysis, diffuse alveolar hemorrhage   BRIEF PATIENT DESCRIPTION:  53 yo female former smoker presented with cough, hemoptysis for 2 days.  Found to have hypoxia and diffuse pulmonary infiltrates.  Serology showed P ANCA 1:640, Myeloperoxidase > 8 . PCCM assumed care in ICU.  SIGNIFICANT EVENTS: 6/15 Admit, PCCM consulted  6/16 Start high dose steroids, Transfuse PRBC 6/17 Start weekly Rituxan  6/18 Hematology consulted to assess for plasma exchange >> not indicated at this time 6/21 Transition to prednisone 6/23 second dose of  weekly rituxan   STUDIES: 6/15 CT chest >> diffuse b/l GGO 6/15 P ANCA 1:640, Myeloperoxidase > 8, HIV non reactive, Aldolase 4, CPK 31, Scl 70 < 1, anti GBM < 1, CCP < 2, RF < 10, ds DNA < 1, ANA negative, ESR 90, CRP 22, SSA < 1, SSB < 1 6/15 U/A >> 100 protein, large Hgb, moderate leukocytes 6/16 Echo >> EF 55 to 60% 6/16 Lupus anticoagulant dectected 6/17 Hep B IgM negative, Hep B core Ab negative, Hep B surface Ag negative, Hep B surface Ab negative, Hep C Ab negative 6/17 G6PD >> 22.8 6/22 desatn off NRB  LINES / TUBES:  Rt PICC 6/17 >>   CULTURES:  Respiratory viral panel 6/15 >> negative Legionella 6/15 >> negative Blood 6/15 >> ng  ANTIBIOTICS:  Levaquin 6/15 >>> 6/17 Ceftriaxone 6/15 >>> one dose 6/15  Azithromycin 6/15 >>> one dose 6/15   SUBJECTIVE:  On NRB, flaps open High level of anxiety continues Afebrile Good UO Sugars better  VITAL SIGNS: Temp:  [97.8 F (36.6 C)-98.9 F (37.2 C)] 97.8 F (36.6 C) (06/25 0750) Pulse Rate:  [69-94] 70 (06/25 0800) Resp:  [16-30] 27 (06/25 0800) BP: (101-160)/(31-89) 143/83 mmHg (06/25 0800) SpO2:  [81 %-100 %] 99 % (06/25 0800) FiO2 (%):  [100 %] 100 % (06/25  0800) INTAKE / OUTPUT: Intake/Output     06/24 0701 - 06/25 0700 06/25 0701 - 06/26 0700   P.O. 1200    I.V. (mL/kg) 40 (0.3)    IV Piggyback 100    Total Intake(mL/kg) 1340 (9.3)    Urine (mL/kg/hr) 4975 (1.4) 125 (0.3)   Total Output 4975 125   Net -3635 -125        Stool Occurrence 2 x     PHYSICAL EXAMINATION: General: Speaks in full sentences,on NRB, anxious affect, pressured speech Neuro: alert, normal strength HEENT: pupils reactive Cardiovascular: regular, no murmur Lungs: diffuse b/l crackles esp basal Abdomen: soft, non tender Musculoskeletal: no edema Skin: no rashes  LABS:  CBC  Recent Labs Lab 06/30/13 0515 07/02/13 0404 07/03/13 0500  WBC 18.2* 24.6* 25.8*  HGB 7.3* 7.1* 7.6*  HCT 23.4* 23.2* 24.3*  PLT 302 341 342   Coag's No results found for this basename: APTT, INR,  in the last 168 hours BMET  Recent Labs Lab 06/30/13 0515 07/02/13 0404 07/03/13 0500  NA 141 139 139  K 3.9 4.4 4.3  CL 103 99 98  CO2 29 29 32  BUN 27* 22 22  CREATININE 0.74 0.69 0.66  GLUCOSE 129* 156* 112*   Electrolytes  Recent Labs Lab 06/28/13 0358 06/29/13 0520 06/30/13 0515 07/02/13 0404 07/03/13 0500  CALCIUM 8.5 8.4 8.1* 8.2* 8.7  MG 2.4 2.4  --   --   --  PHOS 3.0 3.5  --   --   --     Sepsis Markers No results found for this basename: LATICACIDVEN, PROCALCITON, O2SATVEN,  in the last 168 hours ABG No results found for this basename: PHART, PCO2ART, PO2ART,  in the last 168 hours Liver Enzymes  Recent Labs Lab 06/27/13 0600 06/28/13 0358 06/29/13 0520  AST 24 20 19  ALT 32 30 26  ALKPHOS 114 111 98  BILITOT 0.5 0.6 0.6  ALBUMIN 2.6* 2.7* 2.6*   Cardiac Enzymes No results found for this basename: TROPONINI, PROBNP,  in the last 168 hours Glucose  Recent Labs Lab 07/01/13 2118 07/02/13 0735 07/02/13 1214 07/02/13 1653 07/02/13 2140 07/03/13 0723  GLUCAP 255* 119* 186* 132* 181* 122*    Imaging Dg Chest Port 1  View  07/02/2013   CLINICAL DATA:  Pneumonitis  EXAM: PORTABLE CHEST - 1 VIEW  COMPARISON:  06/30/2013  FINDINGS: Severe bilateral airspace disease shows mild interval improvement. No significant effusion. PICC tip in the SVC.  IMPRESSION: Mild improvement in diffuse bilateral airspace opacities.   Electronically Signed   By: Charles  Clark M.D.   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 Plan:  ct solumedrol 60 q 12h (1mg/kg) S/p 2 doses rituxan  >> she got375 mg/m2 weekly >> > Spoke to Rheum -Andersen on 6/22 Defer plasma exchange unless clinical deterioration ct atovaquone 6/21 for PCP prophylaxis (Sulfa allergy) F/u CXR intermittent Oxygen to keep SpO2 > 92% Would not tolerate lung biopsy  Goal  negative fluid balance  Protein and Hb in U/A on admission. Plan: Monitor renal f/x  Hx of GERD. Plan:  Carb modified diet while on high dose steroids Protonix  PRN reglan  Anemia associated with alveolar hemorrhage - acute blood loss, now stable. Leukocytosis >> likely from steroids. Plan:  F/u CBC Transfuse for Hb < 7 SCD for DVT prevention  Steroid induced hyperglycemia  Plan:  SSI    Anxiety. Plan:  Continue bupropion, celexa PRN xanax  Updated patient & sister in detail, PT consult - attempt to mobilise today  Care during the described time interval was provided by me and/or other providers on the critical care team.  I have reviewed this patient's available data, including medical history, events of note, physical examination and test results as part of my evaluation  CC time x 31 m   Rakesh Alva MD. FCCP. [facility] Pager [phone] If no response call [phone]   07/03/2013, 9:34 AM             

## 2013-07-03 NOTE — Progress Notes (Signed)
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 Gave pt Bible, prayer shawls and Guiding Posts. Had prayer w/pt and she said it was very comforting. 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 She said she has been at [employer] 10 yrs. We talked about the importance of her job while here to focus on getting btr. Pt was complimentary of staff here.  Ernest Haber Chaplain  07/03/13 1100  Clinical Encounter Type  Visited With Patient

## 2013-07-04 ENCOUNTER — Inpatient Hospital Stay (HOSPITAL_COMMUNITY): Payer: 59

## 2013-07-04 DIAGNOSIS — M3130 Wegener's granulomatosis without renal involvement: Secondary | ICD-10-CM

## 2013-07-04 LAB — BASIC METABOLIC PANEL
BUN: 24 mg/dL — ABNORMAL HIGH (ref 6–23)
CHLORIDE: 98 meq/L (ref 96–112)
CO2: 31 mEq/L (ref 19–32)
Calcium: 8.6 mg/dL (ref 8.4–10.5)
Creatinine, Ser: 0.7 mg/dL (ref 0.50–1.10)
GFR calc non Af Amer: >90 mL/min (ref >90)
Glucose, Bld: 137 mg/dL — ABNORMAL HIGH (ref 70–99)
POTASSIUM: 4.6 meq/L (ref 3.7–5.3)
SODIUM: 138 meq/L (ref 137–147)

## 2013-07-04 LAB — GLUCOSE, CAPILLARY
GLUCOSE-CAPILLARY: 129 mg/dL — AB (ref 70–99)
Glucose-Capillary: 162 mg/dL — ABNORMAL HIGH (ref 70–99)
Glucose-Capillary: 219 mg/dL — ABNORMAL HIGH (ref 70–99)
Glucose-Capillary: 240 mg/dL — ABNORMAL HIGH (ref 70–99)
Glucose-Capillary: 237 mg/dL — ABNORMAL HIGH (ref 70–99)

## 2013-07-04 LAB — CBC
HEMATOCRIT: 24.2 % — AB (ref 36.0–46.0)
HEMOGLOBIN: 7.4 g/dL — AB (ref 12.0–15.0)
MCH: 24.7 pg — ABNORMAL LOW (ref 26.0–34.0)
MCHC: 30.6 g/dL (ref 30.0–36.0)
MCV: 80.9 fL (ref 78.0–100.0)
Platelets: 335 10*3/uL (ref 150–400)
RBC: 2.99 MIL/uL — AB (ref 3.87–5.11)
RDW: 16.5 % — ABNORMAL HIGH (ref 11.5–15.5)
WBC: 25.7 10*3/uL — ABNORMAL HIGH (ref 4.0–10.5)

## 2013-07-04 MED ORDER — SODIUM CHLORIDE 0.9 % IV SOLN
125.0000 mg | INTRAVENOUS | Status: AC
  Administered 2013-07-04 – 2013-07-08 (×3): 125 mg via INTRAVENOUS
  Filled 2013-07-04 (×4): qty 10

## 2013-07-04 NOTE — Progress Notes (Signed)
Follow-up w/pt b4 leaving. She said she was ok and thanked me for coming by to check on her before leaving. Pls call as additional emotional spt is needed. [place]  07/04/13 1600  Clinical Encounter Type  Visited With Patient

## 2013-07-04 NOTE — Progress Notes (Signed)
Pt was sitting up in bed when I arrived. She said she was frustrated w/being in hospital; didn't understand why her lungs had changed so quickly that she needs oxygen. Pt called nurse and together we tried to explain the importance of relaxing and using her oxygen. Pt said she was ready to get the ___ out. Pt's nurse identified how upset she was and gave her meds. Pt was thankful for my visit and for nurse's care - although she still articulated her frustration w/being in the hospital. I compassionately listened as she vented and provided support and encouraged her to focus on getting better. Ernest Haber Chaplain  07/04/13 1500  Clinical Encounter Type  Visited With Patient

## 2013-07-04 NOTE — Progress Notes (Signed)
PULMONARY / CRITICAL CARE MEDICINE   Name: [name] MRN: [id number] DOB: 1960/03/28    ADMISSION DATE:  06/23/2013 CONSULTATION DATE:  06/23/2013  REFERRING MD : Doyle Askew   CHIEF COMPLAINT: Shortness of breath, hemoptysis, diffuse alveolar hemorrhage   BRIEF PATIENT DESCRIPTION:  53 yo female former smoker presented with cough, hemoptysis for 2 days.  Found to have hypoxia and diffuse pulmonary infiltrates.  Serology showed P ANCA 1:640, Myeloperoxidase > 8 . PCCM assumed care in ICU.  SIGNIFICANT EVENTS: 6/15 Admit, PCCM consulted  6/16 Start high dose steroids, Transfuse PRBC 6/17 Start weekly Rituxan  6/18 Hematology consulted to assess for plasma exchange >> not indicated at this time 6/21 Transition to prednisone 6/23 second dose of  weekly rituxan   STUDIES: 6/15 CT chest >> diffuse b/l GGO 6/15 P ANCA 1:640, Myeloperoxidase > 8, HIV non reactive, Aldolase 4, CPK 31, Scl 70 < 1, anti GBM < 1, CCP < 2, RF < 10, ds DNA < 1, ANA negative, ESR 90, CRP 22, SSA < 1, SSB < 1 6/15 U/A >> 100 protein, large Hgb, moderate leukocytes 6/16 Echo >> EF 55 to 60% 6/16 Lupus anticoagulant dectected 6/17 Hep B IgM negative, Hep B core Ab negative, Hep B surface Ag negative, Hep B surface Ab negative, Hep C Ab negative 6/17 G6PD >> 22.8 6/22 desatn off NRB  LINES / TUBES:  Rt PICC 6/17 >>   CULTURES:  Respiratory viral panel 6/15 >> negative Legionella 6/15 >> negative Blood 6/15 >> ng  ANTIBIOTICS:  Levaquin 6/15 >>> 6/17 Ceftriaxone 6/15 >>> one dose 6/15  Azithromycin 6/15 >>> one dose 6/15   SUBJECTIVE:  On NRB, flaps open, desatn while eating High level of anxiety continues Afebrile Good UO   VITAL SIGNS: Temp:  [97.9 F (36.6 C)-100.1 F (37.8 C)] 97.9 F (36.6 C) (06/26 0800) Pulse Rate:  [73-99] 84 (06/26 0900) Resp:  [19-26] 26 (06/26 0900) BP: (94-145)/(48-107) 145/73 mmHg (06/26 0900) SpO2:  [79 %-100 %] 90 % (06/26 0900) FiO2 (%):  [100 %] 100 % (06/26  0800) Weight:  [144.5 kg (318 lb 9 oz)] 144.5 kg (318 lb 9 oz) (06/26 0400) INTAKE / OUTPUT: Intake/Output     06/25 0701 - 06/26 0700 06/26 0701 - 06/27 0700   P.O. 1080    I.V. (mL/kg)     IV Piggyback     Total Intake(mL/kg) 1080 (7.5)    Urine (mL/kg/hr) 4550 (1.3) 225 (0.6)   Total Output 4550 225   Net -3470 -225        Stool Occurrence 1 x     PHYSICAL EXAMINATION: General: Speaks in full sentences,on NRB, anxious affect Neuro: alert, normal strength HEENT: pupils reactive Cardiovascular: regular, no murmur Lungs: diffuse b/l crackles esp basal Abdomen: soft, non tender Musculoskeletal: no edema Skin: no rashes  LABS:  CBC  Recent Labs Lab 07/02/13 0404 07/03/13 0500 07/04/13 0410  WBC 24.6* 25.8* 25.7*  HGB 7.1* 7.6* 7.4*  HCT 23.2* 24.3* 24.2*  PLT 341 342 335   Coag's No results found for this basename: APTT, INR,  in the last 168 hours BMET  Recent Labs Lab 07/02/13 0404 07/03/13 0500 07/04/13 0410  NA 139 139 138  K 4.4 4.3 4.6  CL 99 98 98  CO2 29 32 31  BUN 22 22 24*  CREATININE 0.69 0.66 0.70  GLUCOSE 156* 112* 137*   Electrolytes  Recent Labs Lab 06/28/13 0358 06/29/13 0520  07/02/13 0404 07/03/13 0500  07/04/13 0410  CALCIUM 8.5 8.4  < > 8.2* 8.7 8.6  MG 2.4 2.4  --   --   --   --   PHOS 3.0 3.5  --   --   --   --   < > = values in this interval not displayed.  Sepsis Markers No results found for this basename: LATICACIDVEN, PROCALCITON, O2SATVEN,  in the last 168 hours ABG No results found for this basename: PHART, PCO2ART, PO2ART,  in the last 168 hours Liver Enzymes  Recent Labs Lab 06/28/13 0358 06/29/13 0520  AST 20 19  ALT 30 26  ALKPHOS 111 98  BILITOT 0.6 0.6  ALBUMIN 2.7* 2.6*   Cardiac Enzymes No results found for this basename: TROPONINI, PROBNP,  in the last 168 hours Glucose  Recent Labs Lab 07/02/13 1653 07/02/13 2140 07/03/13 0723 07/03/13 1104 07/03/13 1609 07/03/13 2120  GLUCAP 132* 181*  122* 203* 144* 162*    Imaging Dg Chest Port 1 View  07/04/2013   CLINICAL DATA:  Pneumonia.  EXAM: PORTABLE CHEST - 1 VIEW  COMPARISON:  07/02/2013.  FINDINGS: PICC line in good stable anatomic position . Heart size stable. Diffuse severe airspace disease is noted bilaterally. This is unchanged. No pleural effusion or pneumothorax.  IMPRESSION: 1. PICC line in stable position. 2. Diffuse unchanged bilateral airspace disease.   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 Plan:  ct solumedrol 60 q 12h (4m/kg) S/p 2 doses rituxan  >> she got375 mg/m2 weekly >> > Spoke to Rheum -[name] 6/22 Defer plasma exchange unless clinical deterioration ct atovaquone 6/21 for PCP prophylaxis (Sulfa allergy) F/u CXR intermittent Oxygen to keep SpO2 > 92% Would not tolerate lung biopsy  ct negative fluid balance  Protein and Hb in U/A on admission. Plan: Monitor renal f/x  Hx of GERD. Plan:  Carb modified diet while on high dose steroids Protonix  PRN reglan  Anemia associated with alveolar hemorrhage - acute blood loss, now stable. Leukocytosis >> likely from steroids. Plan:  F/u CBC Transfuse for Hb < 7 SCD for DVT prevention  Steroid induced hyperglycemia  Plan:  SSI    Anxiety. Plan:  Continue bupropion, celexa PRN xanax  Updated patient & sister in detail, PT consult - ct to mobilise as tolerated  Care during the described time interval was provided by me and/or other providers on the critical care team.  I have reviewed this patient's available data, including medical history, events of note, physical examination and test results as part of my evaluation    RKara MeadMD. FCCP. [place] [place] [facility] Pager [phone] no response call [phone]   07/04/2013, 9:35  AM

## 2013-07-04 NOTE — Progress Notes (Signed)
[name]   DOB:1960/08/03   [id number]   [id number]  Subjective:  [name] continue to desat easily when she gets anxiety.  She says her breathing improved from yesterday She received her second dose of Rituxan on 07/01/2013  She also received IV iron infusion on 07/02/2013 and tolerated well  Objective: Patient is obese and well-nourished Filed Vitals:   07/04/13 1600  BP: 96/33  Pulse: 79  Temp: 97.8 F (36.6 C)  Resp: 26    Body mass index is 47.02 kg/(m^2).  Intake/Output Summary (Last 24 hours) at 07/04/13 1801 Last data filed at 07/04/13 1754  Gross per 24 hour  Intake      0 ml  Output   3875 ml  Net  -3875 ml   HEENT: PERRLA, sclerae anicteric, conjunctiva no pallor               Oropharynx clear  No peripheral adenopathy  Lungs clear -- bibasilar  rales  Heart regular rate and rhythm  Abdomen obese, no masses appreciated  MSK no focal spinal tenderness, no peripheral edema  Neuro nonfocal          Skin: intact    Recent Labs  07/04/13 0737 07/04/13 1241 07/04/13 1608  GLUCAP 129* 219* 240*     Labs:  Lab Results  Component Value Date   WBC 25.7* 07/04/2013   HGB 7.4* 07/04/2013   HCT 24.2* 07/04/2013   MCV 80.9 07/04/2013   PLT 335 07/04/2013   NEUTROABS 15.9* 06/28/2013    @LASTCHEMISTRY @  Urine Studies No results found for this basename: UACOL, UAPR, USPG, UPH, UTP, UGL, UKET, UBIL, UHGB, UNIT, UROB, ULEU, UEPI, UWBC, URBC, UBAC, CAST, CRYS, UCOM, BILUA,  in the last 72 hours  Basic Metabolic Panel:  Recent Labs Lab 06/28/13 0358 06/29/13 0520 06/30/13 0515 07/02/13 0404 07/03/13 0500 07/04/13 0410  NA 140 139 141 139 139 138  K 4.0 4.0 3.9 4.4 4.3 4.6  CL 102 101 103 99 98 98  CO2 27 27 29 29  32 31  GLUCOSE 157* 166* 129* 156* 112* 137*  BUN 30* 28* 27* 22 22 24*  CREATININE 0.76 0.70 0.74 0.69 0.66 0.70  CALCIUM 8.5 8.4 8.1* 8.2* 8.7 8.6  MG 2.4 2.4  --   --   --   --   PHOS 3.0 3.5  --   --   --   --     GFR Estimated Creatinine Clearance: 126.6 ml/min (by C-G formula based on Cr of 0.7). Liver Function Tests:  Recent Labs Lab 06/28/13 0358 06/29/13 0520  AST 20 19  ALT 30 26  ALKPHOS 111 98  BILITOT 0.6 0.6  PROT 6.3 5.9*  ALBUMIN 2.7* 2.6*   No results found for this basename: LIPASE, AMYLASE,  in the last 168 hours No results found for this basename: AMMONIA,  in the last 168 hours Coagulation profile No results found for this basename: INR, PROTIME,  in the last 168 hours  CBC:  Recent Labs Lab 06/28/13 1200 06/30/13 0515 07/02/13 0404 07/03/13 0500 07/04/13 0410  WBC 17.2* 18.2* 24.6* 25.8* 25.7*  NEUTROABS 15.9*  --   --   --   --   HGB 7.5* 7.3* 7.1* 7.6* 7.4*  HCT 23.7* 23.4* 23.2* 24.3* 24.2*  MCV 78.5 79.9 79.7 81.3 80.9  PLT 340 302 341 342 335   Cardiac Enzymes: No results found for this basename: CKTOTAL, CKMB, CKMBINDEX, TROPONINI,  in the last 168 hours BNP:  No components found with this basename: POCBNP,  CBG:  Recent Labs Lab 07/03/13 1609 07/03/13 2120 07/04/13 0737 07/04/13 1241 07/04/13 1608  GLUCAP 144* 162* 129* 219* 240*   D-Dimer No results found for this basename: DDIMER,  in the last 72 hours Hgb A1c No results found for this basename: HGBA1C,  in the last 72 hours Lipid Profile No results found for this basename: CHOL, HDL, LDLCALC, TRIG, CHOLHDL, LDLDIRECT,  in the last 72 hours Thyroid function studies No results found for this basename: TSH, T4TOTAL, FREET3, T3FREE, THYROIDAB,  in the last 72 hours Anemia work up No results found for this basename: VITAMINB12, FOLATE, FERRITIN, TIBC, IRON, RETICCTPCT,  in the last 72 hours Microbiology No results found for this or any previous visit (from the past 240 hour(s)).    Studies:  Dg Chest Port 1 View  07/04/2013   CLINICAL DATA:  Pneumonia.  EXAM: PORTABLE CHEST - 1 VIEW  COMPARISON:  07/02/2013.  FINDINGS: PICC line in good stable anatomic position . Heart size stable.  Diffuse severe airspace disease is noted bilaterally. This is unchanged. No pleural effusion or pneumothorax.  IMPRESSION: 1. PICC line in stable position. 2. Diffuse unchanged bilateral airspace disease.   Electronically Signed   By: Marcello Moores  Register   On: 07/04/2013 07:45    IMPRESSION/RECOMMENDATIONS:  53 y.o. old pleasant female with history of diverticulitis with sigmoid colon perforation, obesity, GERD, chronic constipation was admitted on 06/23/2013 with acute hypoxia with respiratory failure due to diffuse alveolar hemorrhage .She was positive for ANCA vasculitis.   She was started on high-dose steroids on 06/24/2013 and first dose of Rituxan was initiated 06/25/2013. During the hospital course she was transfused with 1 unit of packed RBCs.   I have discussed in detail patient's condition with Dr. Beryle Beams on 06/27/2013 and hematology perspective, no indication for TPE at this time. Continue steroids and rituxan as scheduled as per ICU team.   Her anemia workup revealed mild iron deficiency. Patient hemoglobin slightly improved after one dose of IV Ferric gluconate. I have ordered ferric gluconate 125 mg IV to be given every other day for 3 more doses.Continue folic acid 5 mg by mouth once daily. Her hemoglobin and hematocrit is stable at this time.  If her anemia does not improve with IV iron supplementation, once patient is stable recommend bone marrow aspiration and biopsy to rule out underlying bone marrow pathology.   I also recommend serum and urine protein electrophoresis, serum and urine immunofixation and quantification  of immunoglobulins  I will sign off from now on ,Please call us with any questions or concerns  Wilmon Arms, MD Hematology/Medical oncology 07/04/2013  6:01 PM

## 2013-07-05 LAB — GLUCOSE, CAPILLARY
Glucose-Capillary: 125 mg/dL — ABNORMAL HIGH (ref 70–99)
Glucose-Capillary: 146 mg/dL — ABNORMAL HIGH (ref 70–99)
Glucose-Capillary: 237 mg/dL — ABNORMAL HIGH (ref 70–99)
Glucose-Capillary: 263 mg/dL — ABNORMAL HIGH (ref 70–99)

## 2013-07-05 NOTE — Progress Notes (Signed)
PULMONARY / CRITICAL CARE MEDICINE   Name: [name] MRN: [id number] DOB: 1960-06-04    ADMISSION DATE:  06/23/2013 CONSULTATION DATE:  06/23/2013  REFERRING MD : Doyle Askew   CHIEF COMPLAINT: Shortness of breath, hemoptysis, diffuse alveolar hemorrhage   BRIEF PATIENT DESCRIPTION:  53 yo female former smoker presented with cough, hemoptysis for 2 days.  Found to have hypoxia and diffuse pulmonary infiltrates.  Serology showed P ANCA 1:640, Myeloperoxidase > 8 . PCCM assumed care in ICU.  SIGNIFICANT EVENTS: 6/15 Admit, PCCM consulted  6/16 Start high dose steroids, Transfuse PRBC 6/17 Start weekly Rituxan  6/18 Hematology consulted to assess for plasma exchange >> not indicated at this time 6/21 Transition to prednisone 6/23 second dose of  weekly rituxan   STUDIES: 6/15 CT chest >> diffuse b/l GGO 6/15 P ANCA 1:640, Myeloperoxidase > 8, HIV non reactive, Aldolase 4, CPK 31, Scl 70 < 1, anti GBM < 1, CCP < 2, RF < 10, ds DNA < 1, ANA negative, ESR 90, CRP 22, SSA < 1, SSB < 1 6/15 U/A >> 100 protein, large Hgb, moderate leukocytes 6/16 Echo >> EF 55 to 60% 6/16 Lupus anticoagulant dectected 6/17 Hep B IgM negative, Hep B core Ab negative, Hep B surface Ag negative, Hep B surface Ab negative, Hep C Ab negative 6/17 G6PD >> 22.8 6/22 desatn off NRB  LINES / TUBES:  Rt PICC 6/17 >>   CULTURES:  Respiratory viral panel 6/15 >> negative Legionella 6/15 >> negative Blood 6/15 >> ng  ANTIBIOTICS:  Levaquin 6/15 >>> 6/17 Ceftriaxone 6/15 >>> one dose 6/15  Azithromycin 6/15 >>> one dose 6/15   SUBJECTIVE:  On NRB High level of anxiety continues Afebrile Good UO Sugars high   VITAL SIGNS: Temp:  [97.8 F (36.6 C)-99.2 F (37.3 C)] 97.9 F (36.6 C) (06/27 0800) Pulse Rate:  [72-88] 72 (06/27 0400) Resp:  [20-28] 20 (06/27 0400) BP: (96-120)/(33-90) 120/68 mmHg (06/27 0400) SpO2:  [98 %-100 %] 99 % (06/27 0400) FiO2 (%):  [100 %] 100 % (06/26 2000) Weight:   [143.6 kg (316 lb 9.3 oz)] 143.6 kg (316 lb 9.3 oz) (06/27 0400) INTAKE / OUTPUT: Intake/Output     06/26 0701 - 06/27 0700 06/27 0701 - 06/28 0700   P.O.     IV Piggyback 110    Total Intake(mL/kg) 110 (0.8)    Urine (mL/kg/hr) 3425 (1)    Total Output 3425     Net -3315           PHYSICAL EXAMINATION: General: Speaks in full sentences,on NRB, anxious affect Neuro: alert, normal strength HEENT: pupils reactive Cardiovascular: regular, no murmur Lungs: diffuse b/l crackles esp basal Abdomen: soft, non tender Musculoskeletal: no edema Skin: no rashes  LABS:  CBC  Recent Labs Lab 07/02/13 0404 07/03/13 0500 07/04/13 0410  WBC 24.6* 25.8* 25.7*  HGB 7.1* 7.6* 7.4*  HCT 23.2* 24.3* 24.2*  PLT 341 342 335   Coag's No results found for this basename: APTT, INR,  in the last 168 hours BMET  Recent Labs Lab 07/02/13 0404 07/03/13 0500 07/04/13 0410  NA 139 139 138  K 4.4 4.3 4.6  CL 99 98 98  CO2 29 32 31  BUN 22 22 24*  CREATININE 0.69 0.66 0.70  GLUCOSE 156* 112* 137*   Electrolytes  Recent Labs Lab 06/29/13 0520  07/02/13 0404 07/03/13 0500 07/04/13 0410  CALCIUM 8.4  < > 8.2* 8.7 8.6  MG 2.4  --   --   --   --  PHOS 3.5  --   --   --   --   < > = values in this interval not displayed.  Sepsis Markers No results found for this basename: LATICACIDVEN, PROCALCITON, O2SATVEN,  in the last 168 hours ABG No results found for this basename: PHART, PCO2ART, PO2ART,  in the last 168 hours Liver Enzymes  Recent Labs Lab 06/29/13 0520  AST 19  ALT 26  ALKPHOS 98  BILITOT 0.6  ALBUMIN 2.6*   Cardiac Enzymes No results found for this basename: TROPONINI, PROBNP,  in the last 168 hours Glucose  Recent Labs Lab 07/03/13 2120 07/04/13 0737 07/04/13 1241 07/04/13 1608 07/04/13 2211 07/05/13 0738  GLUCAP 162* 129* 219* 240* 237* 125*    Imaging Dg Chest Port 1 View  07/04/2013   CLINICAL DATA:  Pneumonia.  EXAM: PORTABLE CHEST - 1 VIEW   COMPARISON:  07/02/2013.  FINDINGS: PICC line in good stable anatomic position . Heart size stable. Diffuse severe airspace disease is noted bilaterally. This is unchanged. No pleural effusion or pneumothorax.  IMPRESSION: 1. PICC line in stable position. 2. Diffuse unchanged bilateral airspace disease.   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 Plan:  ct solumedrol 60 q 12h (29m/kg) S/p 2 doses rituxan  >> she got375 mg/m2 weekly >> > Spoke to Rheum -[name] 6/22 Defer plasma exchange unless clinical deterioration ct atovaquone 6/21 for PCP prophylaxis (Sulfa allergy) F/u CXR intermittent Oxygen to keep SpO2 > 92% Would not tolerate lung biopsy  ct negative fluid balance  Protein and Hb in U/A on admission. Plan: Monitor renal f/x  Hx of GERD. Plan:  Carb modified diet while on high dose steroids Protonix  PRN reglan  Anemia associated with alveolar hemorrhage - acute blood loss, now stable. Leukocytosis >> likely from steroids. Plan:  F/u CBC Transfuse for Hb < 7 SCD for DVT prevention  Steroid induced hyperglycemia  Plan:  SSI    Anxiety. Plan:  Continue bupropion, celexa PRN xanax  Updated patient in detail, no change in plan  -may have to consider plasmapheresis next week  Care during the described time interval was provided by me and/or other providers on the critical care team.  I have reviewed this patient's available data, including medical history, events of note, physical examination and test results as part of my evaluation    RKara MeadMD. FCCP. [facility] Pager [phone] no response call [phone]   07/05/2013, 9:50 AM

## 2013-07-06 ENCOUNTER — Inpatient Hospital Stay (HOSPITAL_COMMUNITY): Payer: 59

## 2013-07-06 LAB — GLUCOSE, CAPILLARY
GLUCOSE-CAPILLARY: 113 mg/dL — AB (ref 70–99)
Glucose-Capillary: 195 mg/dL — ABNORMAL HIGH (ref 70–99)
Glucose-Capillary: 125 mg/dL — ABNORMAL HIGH (ref 70–99)

## 2013-07-06 MED ORDER — BIOTENE DRY MOUTH MT LIQD
15.0000 mL | Freq: Two times a day (BID) | OROMUCOSAL | Status: DC
  Administered 2013-07-07 – 2013-07-21 (×22): 15 mL via OROMUCOSAL

## 2013-07-06 NOTE — Progress Notes (Addendum)
[name] / CRITICAL CARE MEDICINE   Name: [name] MRN: [id number] DOB: 1960/06/21    ADMISSION DATE:  06/23/2013 CONSULTATION DATE:  06/23/2013  REFERRING MD : Doyle Askew   CHIEF COMPLAINT: Shortness of breath, hemoptysis, diffuse alveolar hemorrhage   BRIEF PATIENT DESCRIPTION:  53 yo female former smoker presented with cough, hemoptysis for 2 days.  Found to have hypoxia and diffuse [name] infiltrates.  Serology showed P ANCA 1:640, Myeloperoxidase > 8 . PCCM assumed care in ICU.  SIGNIFICANT EVENTS: 6/15 Admit, PCCM consulted  6/16 Start high dose steroids, Transfuse PRBC 6/17 Start weekly Rituxan  6/18 Hematology consulted to assess for plasma exchange >> not indicated at this time 6/21 Transition to prednisone 6/23 second dose of  weekly rituxan   STUDIES: 6/15 CT chest >> diffuse b/l GGO 6/15 P ANCA 1:640, Myeloperoxidase > 8, HIV non reactive, Aldolase 4, CPK 31, Scl 70 < 1, anti GBM < 1, CCP < 2, RF < 10, ds DNA < 1, ANA negative, ESR 90, CRP 22, SSA < 1, SSB < 1 6/15 U/A >> 100 protein, large Hgb, moderate leukocytes 6/16 Echo >> EF 55 to 60% 6/16 Lupus anticoagulant dectected 6/17 Hep B IgM negative, Hep B core Ab negative, Hep B surface Ag negative, Hep B surface Ab negative, Hep C Ab negative 6/17 G6PD >> 22.8 6/22 desatn off NRB  LINES / TUBES:  Rt PICC 6/17 >>   CULTURES:  Respiratory viral panel 6/15 >> negative Legionella 6/15 >> negative Blood 6/15 >> ng  ANTIBIOTICS:  Levaquin 6/15 >>> 6/17 Ceftriaxone 6/15 >>> one dose 6/15  Azithromycin 6/15 >>> one dose 6/15   SUBJECTIVE:  Remains On NRB High level of anxiety continues Afebrile Good UO Sugars controlled   VITAL SIGNS: Temp:  [97.7 F (36.5 C)-98.8 F (37.1 C)] 98.8 F (37.1 C) (06/28 0800) Pulse Rate:  [69-100] 100 (06/28 1100) Resp:  [16-28] 25 (06/28 1100) BP: (75-155)/(53-101) 144/94 mmHg (06/28 1100) SpO2:  [86 %-100 %] 96 % (06/28 1100) INTAKE / OUTPUT: Intake/Output   06/27 0701 - 06/28 0700 06/28 0701 - 06/29 0700   P.O. 720    I.V. (mL/kg) 460 (3.2)    IV Piggyback     Total Intake(mL/kg) 1180 (8.2)    Urine (mL/kg/hr) 4000 (1.2)    Total Output 4000     Net -2820          Stool Occurrence 2 x     PHYSICAL EXAMINATION: General: Speaks in full sentences,on NRB, anxious affect Neuro: alert, normal strength HEENT: pupils reactive Cardiovascular: regular, no murmur Lungs: diffuse b/l crackles esp basal Abdomen: soft, non tender Musculoskeletal: no edema Skin: no rashes  LABS:  CBC  Recent Labs Lab 07/02/13 0404 07/03/13 0500 07/04/13 0410  WBC 24.6* 25.8* 25.7*  HGB 7.1* 7.6* 7.4*  HCT 23.2* 24.3* 24.2*  PLT 341 342 335   Coag's No results found for this basename: APTT, INR,  in the last 168 hours BMET  Recent Labs Lab 07/02/13 0404 07/03/13 0500 07/04/13 0410  NA 139 139 138  K 4.4 4.3 4.6  CL 99 98 98  CO2 29 32 31  BUN 22 22 24*  CREATININE 0.69 0.66 0.70  GLUCOSE 156* 112* 137*   Electrolytes  Recent Labs Lab 07/02/13 0404 07/03/13 0500 07/04/13 0410  CALCIUM 8.2* 8.7 8.6    Sepsis Markers No results found for this basename: LATICACIDVEN, PROCALCITON, O2SATVEN,  in the last 168 hours ABG No results found for this  basename: PHART, PCO2ART, PO2ART,  in the last 168 hours Liver Enzymes No results found for this basename: AST, ALT, ALKPHOS, BILITOT, ALBUMIN,  in the last 168 hours Cardiac Enzymes No results found for this basename: TROPONINI, PROBNP,  in the last 168 hours Glucose  Recent Labs Lab 07/04/13 2211 07/05/13 0738 07/05/13 1233 07/05/13 1657 07/05/13 2136 07/06/13 0734  GLUCAP 237* 125* 146* 237* 263* 113*    Imaging No results found.  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 Plan: Repeat CT chest -high res, no contrast ? Fibrosis now ct solumedrol 60 q 12h (109m/kg) S/p 2 doses  rituxan  >> she got375 mg/m2 weekly >> > Spoke to Rheum -[name] 6/22 Consider 3rd dose rituxan no 6/30 vs cytoxan vs plasma exchange if clinical deterioration ct atovaquone 6/21 for PCP prophylaxis (Sulfa allergy) F/u CXR intermittent Oxygen to keep SpO2 > 92% Would not tolerate lung biopsy  ct negative fluid balance  Protein and Hb in U/A on admission. Plan: Monitor renal f/x  Hx of GERD. Plan:  Carb modified diet while on high dose steroids Protonix  PRN reglan  Anemia associated with alveolar hemorrhage - acute blood loss, now stable. Leukocytosis >> likely from steroids. Plan:  F/u CBC Transfuse for Hb < 7 SCD for DVT prevention  Steroid induced hyperglycemia  Plan:  SSI    Anxiety. Plan:  Continue bupropion, celexa PRN xanax  Updated patient in detail, no change in plan  -may have to consider plasmapheresis next week -hypoxia has not responded to rituxan + steroids, no alternative diagnosis available  Care during the described time interval was provided by me and/or other providers on the critical care team.  I have reviewed this patient's available data, including medical history, events of note, physical examination and test results as part of my evaluation  Cc time x 31 m  [name]. [name] [name] Pager [phone] no response call [phone]   07/06/2013, 11:32 AM

## 2013-07-07 LAB — GLUCOSE, CAPILLARY
GLUCOSE-CAPILLARY: 160 mg/dL — AB (ref 70–99)
GLUCOSE-CAPILLARY: 216 mg/dL — AB (ref 70–99)
GLUCOSE-CAPILLARY: 154 mg/dL — AB (ref 70–99)
Glucose-Capillary: 216 mg/dL — ABNORMAL HIGH (ref 70–99)
Glucose-Capillary: 151 mg/dL — ABNORMAL HIGH (ref 70–99)

## 2013-07-07 LAB — CBC
HCT: 25.3 % — ABNORMAL LOW (ref 36.0–46.0)
Hemoglobin: 7.7 g/dL — ABNORMAL LOW (ref 12.0–15.0)
MCH: 25.2 pg — ABNORMAL LOW (ref 26.0–34.0)
MCHC: 30.4 g/dL (ref 30.0–36.0)
MCV: 83 fL (ref 78.0–100.0)
PLATELETS: 332 10*3/uL (ref 150–400)
RBC: 3.05 MIL/uL — AB (ref 3.87–5.11)
RDW: 17.9 % — ABNORMAL HIGH (ref 11.5–15.5)
WBC: 25.1 10*3/uL — ABNORMAL HIGH (ref 4.0–10.5)

## 2013-07-07 LAB — URINALYSIS, ROUTINE W REFLEX MICROSCOPIC
BILIRUBIN URINE: NEGATIVE
Glucose, UA: NEGATIVE mg/dL
Ketones, ur: NEGATIVE mg/dL
Nitrite: NEGATIVE
Protein, ur: NEGATIVE mg/dL
SPECIFIC GRAVITY, URINE: 1.01 (ref 1.005–1.030)
Urobilinogen, UA: 1 mg/dL (ref 0.0–1.0)
pH: 6.5 (ref 5.0–8.0)

## 2013-07-07 LAB — BASIC METABOLIC PANEL
BUN: 23 mg/dL (ref 6–23)
CO2: 32 mEq/L (ref 19–32)
CREATININE: 0.68 mg/dL (ref 0.50–1.10)
Calcium: 8.6 mg/dL (ref 8.4–10.5)
Chloride: 96 mEq/L (ref 96–112)
GFR calc Af Amer: >90 mL/min (ref >90)
GFR calc non Af Amer: >90 mL/min (ref >90)
GLUCOSE: 177 mg/dL — AB (ref 70–99)
Potassium: 4.5 mEq/L (ref 3.7–5.3)
SODIUM: 138 meq/L (ref 137–147)

## 2013-07-07 LAB — URINE MICROSCOPIC-ADD ON

## 2013-07-07 MED ORDER — FENTANYL CITRATE 0.05 MG/ML IJ SOLN
INTRAMUSCULAR | Status: AC
  Filled 2013-07-07: qty 2

## 2013-07-07 MED ORDER — HEPARIN SODIUM (PORCINE) 1000 UNIT/ML DIALYSIS
1000.0000 [IU] | INTRAMUSCULAR | Status: DC | PRN
  Administered 2013-07-08 (×2): 1200 [IU] via INTRAVENOUS_CENTRAL
  Filled 2013-07-07: qty 3

## 2013-07-07 MED ORDER — FENTANYL CITRATE 0.05 MG/ML IJ SOLN
25.0000 ug | Freq: Once | INTRAMUSCULAR | Status: AC
  Administered 2013-07-07: 25 ug via INTRAVENOUS

## 2013-07-07 MED ORDER — ALPRAZOLAM 0.5 MG PO TABS
1.0000 mg | ORAL_TABLET | Freq: Four times a day (QID) | ORAL | Status: DC | PRN
  Administered 2013-07-07 – 2013-07-09 (×6): 1 mg via ORAL
  Filled 2013-07-07 (×2): qty 2
  Filled 2013-07-07: qty 1
  Filled 2013-07-07 (×3): qty 2

## 2013-07-07 NOTE — Progress Notes (Signed)
PT Cancellation Note  Patient Details Name: ANNALEESE KINKADE MRN: [id number] DOB: 04-11-1960   Cancelled Treatment:    Reason Eval/Treat Not Completed: Fatigue/lethargy limiting ability to participate. Pt just returned to bed from [facility] and is SOB. She requested PT try later. Will follow.    Blondell Reveal Kistler 07/07/2013, 1:07 PM [phone]

## 2013-07-07 NOTE — Progress Notes (Signed)
Pt transferring to 48M at [facility]; report called to receiving RN, awaiting [facility] for transport at this time

## 2013-07-07 NOTE — Consult Note (Signed)
Renal Service Consult Note [facility]  [name] 07/07/2013 [name] Requesting Physician:  Dr [name]  Reason for Consult:  Need plasmapheresis for pulmonary [facility] patient HPI: The patient is a 53 y.o. year-old with hx of obesity, GERD, tobacco use and anxiety who presented on 06/23/13 to ED with SOB sudden onset x 1 hr, associated with hemoptysis for 2 days.  Was in distress initially. She had had an ear infection and rec'd abx several weeks ago.  SaO2 was in the 70's on arrival. CXR and chest CT showed diffuse infiltrates and hemorrhage was suspected. She was admitted and given IV abx and O2 support. ANCA titer came back + at 1:640 (P-ANCA) with normal c-ANCA and anti=GBM Ab.  MPO Abs were up at > 8.0 and pr-3 Ab's were negative. She was treated with high dose steroids. On 6/16 she rec'd Rituxan and then had another Rituxan dose on 6/23.  Steroids were tapered down to prednisone.  On 6/29 (today) her pulm infiltrates and resp status have stabilized but are not significantly improving.  Pulm has asked renal svc to do plasmapheresis on this patient.   ROS  no HA, blurred vision  Hemoptysis is better  still sob  no CP  no abd pain , n/v/d  no swelling  no hx of auto-immune disease  Past Medical History  Past Medical History  Diagnosis Date  . Colitis   . Obesity (BMI 30-39.9) 09/23/2012  . Anxiety associated with depression 09/23/2012  . Hx of tobacco use, presenting hazards to health 09/23/2012  . H/O gastroesophageal reflux (GERD) 09/23/2012  . At high risk for falls 09/23/2012    She has had a fractured ankle and tendon tear with falls over the last couple years.  . Diverticulitis of large intestine with perforation 09/23/2012  . GERD (gastroesophageal reflux disease)    Past Surgical History  Past Surgical History  Procedure Laterality Date  . Appendectomy    . Tonsillectomy    . Bladder repair     Family History  Family History  Problem  Relation Age of Onset  . Cancer Paternal Grandmother     breast   Social History  reports that she quit smoking about 12 years ago. She has never used smokeless tobacco. She reports that she does not drink alcohol or use illicit drugs. Allergies  Allergies  Allergen Reactions  . Sulfa Antibiotics    Home medications Prior to Admission medications   Medication Sig Start Date End Date Taking? Authorizing Provider  ALPRAZolam Duanne Moron) 1 MG tablet Take 1 mg by mouth 3 (three) times daily as needed for anxiety.  08/30/12  Yes Historical Provider, MD  buPROPion (WELLBUTRIN XL) 300 MG 24 hr tablet Take 300 mg by mouth daily.  08/30/12  Yes Historical Provider, MD  citalopram (CELEXA) 40 MG tablet Take 40 mg by mouth daily.   Yes Historical Provider, MD  phentermine 37.5 MG capsule Take 37.5 mg by mouth every morning.   Yes Historical Provider, MD  polyethylene glycol (MIRALAX / GLYCOLAX) packet Take 17 g by mouth 2 (two) times daily as needed (constipation).    Yes Historical Provider, MD   Liver Function Tests No results found for this basename: AST, ALT, ALKPHOS, BILITOT, PROT, ALBUMIN,  in the last 168 hours No results found for this basename: LIPASE, AMYLASE,  in the last 168 hours CBC  Recent Labs Lab 07/03/13 0500 07/04/13 0410 07/07/13 0600  WBC 25.8* 25.7* 25.1*  HGB 7.6* 7.4* 7.7*  HCT 24.3* 24.2* 25.3*  MCV 81.3 80.9 83.0  PLT 342 335 [id number]   Basic Metabolic Panel  Recent Labs Lab 07/02/13 0404 07/03/13 0500 07/04/13 0410 07/07/13 0600  NA 139 139 138 138  K 4.4 4.3 4.6 4.5  CL 99 98 98 96  CO2 29 32 31 32  GLUCOSE 156* 112* 137* 177*  BUN 22 22 24* 23  CREATININE 0.69 0.66 0.70 0.68  CALCIUM 8.2* 8.7 8.6 8.6    Filed Vitals:   07/07/13 0700 07/07/13 0800 07/07/13 0900 07/07/13 1200  BP:   129/61   Pulse: 89  88   Temp:  97.7 F (36.5 C)  98.1 F (36.7 C)  TempSrc:  Oral  Oral  Resp: 24  20   Height:      Weight:      SpO2: 100%  98%    Exam Moribdly  obese, pleasant WF, on NRB mask, some tachypnea w speaking No rash, cyanosis or gangrene Sclera anicteric, throat clear No JVD Chest bilat diffuse soft insp rales RRR no MRG Abd obese, soft, NTND Trace ankle edema bilat Neuro is nf, Ox3  UA- neg protein, 3-6 wbc, 7-10 rbc Creat 0.68, BUN 23, K 4.5, CO2 32 CXR diffuse dense bilat infiltrates CT chest - same as CXR   Assessment: 1 Resp failure / pulm infiltrates / hemoptysis / +c-ANCA-- 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  She does not have any clearcut renal disease. Resp status remains guarded after 2 weeks of Rx and plasmapheresis has been requested.  Will arrange for plasmapheresis w albumin replacement to be started tomorrow AM.  Plan 7 treatments over a 2 week period starting tomorrow am with 1.0 plasma volume removal each Rx.  Have d/w primary and with patient and her sister.  2 Obesity 3 Anxiety / depression    Plan- as above. Will follow.   Kelly Splinter MD (pgr) [phone]    [phone] 07/07/2013, 3:59 PM

## 2013-07-07 NOTE — Progress Notes (Addendum)
PULMONARY / CRITICAL CARE MEDICINE   Name: [name] MRN: [id number] DOB: 09/20/1960    ADMISSION DATE:  06/23/2013 CONSULTATION DATE:  06/23/2013  REFERRING MD : [name]   CHIEF COMPLAINT: Shortness of breath, hemoptysis, diffuse alveolar hemorrhage   BRIEF PATIENT DESCRIPTION:  53 yo female former smoker presented with cough, hemoptysis for 2 days.  Found to have hypoxia and diffuse pulmonary infiltrates.  Serology showed P ANCA 1:640, Myeloperoxidase > 8 . PCCM assumed care in ICU.  SIGNIFICANT EVENTS: 6/15 Admit, PCCM consulted  6/16 Start high dose steroids, Transfuse PRBC 6/17 Start weekly Rituxan  6/18 Hematology consulted to assess for plasma exchange >> not indicated at this time 6/21 Transition to prednisone 6/23 second dose of  weekly rituxan  6/29 Patient continues to need NRB  STUDIES: 6/15 CT chest >> diffuse b/l GGO 6/15 P ANCA 1:640, Myeloperoxidase > 8, HIV non reactive, Aldolase 4, CPK 31, Scl 70 < 1, anti GBM < 1, CCP < 2, RF < 10, ds DNA < 1, ANA negative, ESR 90, CRP 22, SSA < 1, SSB < 1 6/15 U/A >> 100 protein, large Hgb, moderate leukocytes 6/16 Echo >> EF 55 to 60% 6/16 Lupus anticoagulant dectected 6/17 Hep B IgM negative, Hep B core Ab negative, Hep B surface Ag negative, Hep B surface Ab negative, Hep C Ab negative 6/17 G6PD >> 22.8 6/22 desatn off NRB  LINES / TUBES:  Rt PICC 6/17 >>   CULTURES:  Respiratory viral panel 6/15 >> negative Legionella 6/15 >> negative Blood 6/15 >> neg  ANTIBIOTICS:  Levaquin 6/15 >>> 6/17 Ceftriaxone 6/15 >>> one dose 6/15  Azithromycin 6/15 >>> one dose 6/15 Atovaquone 6/21 >>>   SUBJECTIVE:  Remains On NRB High level of anxiety continues Afebrile Incontinent of urine, possible bladder spasms -> UA Sugars controlled   VITAL SIGNS: Temp:  [97.7 F (36.5 C)-98.6 F (37 C)] 97.7 F (36.5 C) (06/29 0500) Pulse Rate:  [72-102] 89 (06/29 0700) Resp:  [15-34] 24 (06/29 0700) BP: (75-167)/(48-146)  146/69 mmHg (06/29 0500) SpO2:  [89 %-100 %] 100 % (06/29 0700) Weight:  [144.6 kg (318 lb 12.6 oz)] 144.6 kg (318 lb 12.6 oz) (06/29 0500) NRB 15 L INTAKE / OUTPUT: Intake/Output     06/28 0701 - 06/29 0700 06/29 0701 - 06/30 0700   P.O. 180    I.V. (mL/kg) 460 (3.2)    IV Piggyback 110    Total Intake(mL/kg) 750 (5.2)    Urine (mL/kg/hr) 2550 (0.7)    Total Output 2550     Net -1800          Urine Occurrence 8 x     PHYSICAL EXAMINATION: General: Speaks in full sentences,on NRB, anxious affect Neuro: alert, normal strength HEENT: pupils reactive Cardiovascular: regular, no murmur Lungs: Improving RUL crackles  Abdomen: soft, non tender Musculoskeletal: no edema Skin: pale, cap refill <3 seconds, no rashes  LABS:  CBC  Recent Labs Lab 07/03/13 0500 07/04/13 0410 07/07/13 0600  WBC 25.8* 25.7* 25.1*  HGB 7.6* 7.4* 7.7*  HCT 24.3* 24.2* 25.3*  PLT 342 335 332   Coag's No results found for this basename: APTT, INR,  in the last 168 hours BMET  Recent Labs Lab 07/03/13 0500 07/04/13 0410 07/07/13 0600  NA 139 138 138  K 4.3 4.6 4.5  CL 98 98 96  CO2 32 31 32  BUN 22 24* 23  CREATININE 0.66 0.70 0.68  GLUCOSE 112* 137* 177*   Electrolytes  Recent Labs Lab 07/03/13 0500 07/04/13 0410 07/07/13 0600  CALCIUM 8.7 8.6 8.6    Sepsis Markers No results found for this basename: LATICACIDVEN, PROCALCITON, O2SATVEN,  in the last 168 hours ABG No results found for this basename: PHART, PCO2ART, PO2ART,  in the last 168 hours Liver Enzymes No results found for this basename: AST, ALT, ALKPHOS, BILITOT, ALBUMIN,  in the last 168 hours Cardiac Enzymes No results found for this basename: TROPONINI, PROBNP,  in the last 168 hours Glucose  Recent Labs Lab 07/05/13 1657 07/05/13 2136 07/06/13 0734 07/06/13 1142 07/06/13 1634 07/06/13 2305  GLUCAP 237* 263* 113* 195* 125* 160*    Imaging No results found.  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 Plan: F/u Repeat CT chest  ct solumedrol 60 q 12h (68m/kg) S/p 2 doses rituxan  >> she got375 mg/m2 weekly >> > Spoke to Rheum -[name] 6/22 Consider 3rd dose rituxan no 6/30 vs cytoxan vs plasma exchange if clinical deterioration ct atovaquone 6/21 for PCP prophylaxis (Sulfa allergy) F/u CXR intermittent Oxygen to keep SpO2 > 92% Would not tolerate lung biopsy  ct negative fluid balance  Protein and Hb in U/A on admission. 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 Received IV iron on 6/24 Plan:  Heme rec 6/26:   - recommend serum and urine protein electrophoresis, serum and urine immunofixation and quantification of  immunoglobulins  - ferric gluconate 125 mg IV to be given every other day for 3 more doses  - If no improvement in anemia, and once stable Heme recommends bone marrow aspiration and biopsy to rule out underlying bone marrow pathology Ct Atovaquone for PCP prophylaxis F/u CBC Transfuse for Hb < 7 SCD for DVT prevention  Steroid induced hyperglycemia  Plan:  SSI    Anxiety. Plan:  Continue bupropion, celexa PRN xanax  Patient with c/o urinary incontinence and possible bladder spasm, will send UA. Updated patient at length at bedside.  Care during the described time interval was provided by me and/or other providers on the critical care team.  I have reviewed this patient's available data, including medical history, events of note, physical examination and test results as part of my evaluation  Attending:  I have seen and examined the patient with nurse practitioner/resident and agree with the note above.   It appears that she  has not responded to therapy.  Her CT scan from yesterday appears worse and her oxygenation has not significantly improved.    We will need to start plasma exchange in the next 24 hours.  I will ask hematology to see her again to help u[place]ordinate this.  CC time by me 45 minutes  BRoselie Awkward MD LTroutdalePCCM Pager: [phone]: [phone] no response, call [phone]

## 2013-07-08 ENCOUNTER — Inpatient Hospital Stay (HOSPITAL_COMMUNITY): Payer: 59

## 2013-07-08 LAB — CBC
HEMATOCRIT: 26.4 % — AB (ref 36.0–46.0)
HEMOGLOBIN: 7.8 g/dL — AB (ref 12.0–15.0)
MCH: 25.3 pg — ABNORMAL LOW (ref 26.0–34.0)
MCHC: 29.5 g/dL — AB (ref 30.0–36.0)
MCV: 85.7 fL (ref 78.0–100.0)
Platelets: 295 10*3/uL (ref 150–400)
RBC: 3.08 MIL/uL — ABNORMAL LOW (ref 3.87–5.11)
RDW: 18.5 % — ABNORMAL HIGH (ref 11.5–15.5)
WBC: 20.3 10*3/uL — ABNORMAL HIGH (ref 4.0–10.5)

## 2013-07-08 LAB — GLUCOSE, CAPILLARY
GLUCOSE-CAPILLARY: 123 mg/dL — AB (ref 70–99)
GLUCOSE-CAPILLARY: 195 mg/dL — AB (ref 70–99)
GLUCOSE-CAPILLARY: 180 mg/dL — AB (ref 70–99)
Glucose-Capillary: 128 mg/dL — ABNORMAL HIGH (ref 70–99)
Glucose-Capillary: 149 mg/dL — ABNORMAL HIGH (ref 70–99)
Glucose-Capillary: 150 mg/dL — ABNORMAL HIGH (ref 70–99)

## 2013-07-08 LAB — COMPREHENSIVE METABOLIC PANEL WITH GFR
ALT: 35 U/L (ref 0–35)
AST: 14 U/L (ref 0–37)
Albumin: 2.8 g/dL — ABNORMAL LOW (ref 3.5–5.2)
Alkaline Phosphatase: 108 U/L (ref 39–117)
BUN: 22 mg/dL (ref 6–23)
CO2: 32 meq/L (ref 19–32)
Calcium: 8.8 mg/dL (ref 8.4–10.5)
Chloride: 97 meq/L (ref 96–112)
Creatinine, Ser: 0.64 mg/dL (ref 0.50–1.10)
GFR calc Af Amer: >90 mL/min
GFR calc non Af Amer: >90 mL/min
Glucose, Bld: 116 mg/dL — ABNORMAL HIGH (ref 70–99)
Potassium: 4.4 meq/L (ref 3.7–5.3)
Sodium: 140 meq/L (ref 137–147)
Total Bilirubin: 0.9 mg/dL (ref 0.3–1.2)
Total Protein: 6.1 g/dL (ref 6.0–8.3)

## 2013-07-08 MED ORDER — DIPHENHYDRAMINE HCL 25 MG PO CAPS
25.0000 mg | ORAL_CAPSULE | Freq: Four times a day (QID) | ORAL | Status: DC | PRN
  Filled 2013-07-08: qty 1

## 2013-07-08 MED ORDER — ACD FORMULA A 0.73-2.45-2.2 GM/100ML VI SOLN
500.0000 mL | Status: DC
  Administered 2013-07-12: 500 mL via INTRAVENOUS
  Filled 2013-07-08 (×2): qty 500

## 2013-07-08 MED ORDER — HEPARIN SODIUM (PORCINE) 1000 UNIT/ML IJ SOLN: 1000.0000 [IU] | Freq: Once | INTRAMUSCULAR | Status: DC

## 2013-07-08 MED ORDER — CALCIUM CARBONATE ANTACID 500 MG PO CHEW
2.0000 | CHEWABLE_TABLET | ORAL | Status: AC
  Administered 2013-07-08 (×2): 400 mg via ORAL
  Filled 2013-07-08 (×2): qty 2

## 2013-07-08 MED ORDER — SODIUM CHLORIDE 0.9 % IV SOLN
4.0000 g | Freq: Once | INTRAVENOUS | Status: AC
  Administered 2013-07-08: 4 g via INTRAVENOUS
  Filled 2013-07-08: qty 40

## 2013-07-08 MED ORDER — SODIUM CHLORIDE 0.9 % IV SOLN
INTRAVENOUS | Status: AC
  Administered 2013-07-08 (×5): via INTRAVENOUS_CENTRAL
  Filled 2013-07-08 (×4): qty 200

## 2013-07-08 MED ORDER — ACETAMINOPHEN 325 MG PO TABS: 650.0000 mg | ORAL_TABLET | ORAL | Status: DC | PRN

## 2013-07-08 MED ORDER — ACD FORMULA A 0.73-2.45-2.2 GM/100ML VI SOLN
Status: AC
  Administered 2013-07-08: 10:00:00
  Filled 2013-07-08: qty 500

## 2013-07-08 NOTE — Progress Notes (Signed)
  [name] Progress Note   Subjective: No new complaints, has got new temp HD cath and was moved to [facility] ICU overnight  Filed Vitals:   07/08/13 0400 07/08/13 0500 07/08/13 0600 07/08/13 0700  BP: 140/91 134/88 132/104   Pulse: 77 74 78 70  Temp:      TempSrc:      Resp: 15 15 14 15   Height:      Weight:   143.6 kg (316 lb 9.3 oz)   SpO2: 100% 100% 100% 100%   Exam: NRB mask, not in severe distress, alert and responsive No JVD  Chest bilat soft insp rales 1/2 up bilat RRR no MRG  Abd obese, soft, NTND  Trace ankle edema bilat  Neuro is nf, Ox3  UA- neg protein, 3-6 wbc, 7-10 rbc  CXR diffuse dense bilat infiltrates  CT chest - same as CXR   Assessment:  1 Resp failure / diffuse pulm infiltrates / hemoptysis / +c-ANCA-- provisional dx of MPA (microscopic polyangiitis) lung disease.  Plan plasmapheresis with 7 treatments over 2 weeks, 1.0 plasma volume w albumin replacement 2 Obesity  3 Anxiety / depression  Plan- Plasmapheresis today    Kelly Splinter MD  pager [phone]    cell [phone]  07/08/2013, 8:25 AM     Recent Labs Lab 07/04/13 0410 07/07/13 0600 07/08/13 0611  NA 138 138 140  K 4.6 4.5 4.4  CL 98 96 97  CO2 31 32 32  GLUCOSE 137* 177* 116*  BUN 24* 23 22  CREATININE 0.70 0.68 0.64  CALCIUM 8.6 8.6 8.8    Recent Labs Lab 07/08/13 0611  AST 14  ALT 35  ALKPHOS 108  BILITOT 0.9  PROT 6.1  ALBUMIN 2.8*    Recent Labs Lab 07/04/13 0410 07/07/13 0600 07/08/13 0611  WBC 25.7* 25.1* 20.3*  HGB 7.4* 7.7* 7.8*  HCT 24.2* 25.3* 26.4*  MCV 80.9 83.0 85.7  PLT 335 332 295   . antiseptic oral rinse  15 mL Mouth Rinse BID  . atovaquone  1,500 mg Oral Q breakfast  . buPROPion  300 mg Oral Daily  . citalopram  40 mg Oral Daily  . ferric gluconate (FERRLECIT/NULECIT) IV  125 mg Intravenous QODAY  . folic acid  5 mg Oral Daily  . insulin aspart  0-20 Units Subcutaneous TID WC  . methylPREDNISolone (SOLU-MEDROL) injection  60  mg Intravenous Q12H  . pantoprazole  40 mg Oral QHS  . sodium chloride  10-40 mL Intracatheter Q12H   . sodium chloride 10 mL/hr at 07/08/13 0000   ALPRAZolam, fentaNYL, heparin, HYDROcodone-acetaminophen, metoCLOPramide (REGLAN) injection, ondansetron (ZOFRAN) IV, polyethylene glycol, sodium chloride

## 2013-07-08 NOTE — Progress Notes (Signed)
PT Cancellation Note  Patient Details Name: [name] MRN: [id number] DOB: [birth year]   Cancelled Treatment:    Reason Eval/Treat Not Completed: Medical issues which prohibited therapy.   MD: PT has been cancelled last two sessions due to medical complications. Spoke with nursing who feels PT can resume tomorrow once initial plasmaphoresis complete today. Please advise if you do not agree. Thanks.     INGOLD,DAWN 07/08/2013, 10:38 AM [facility] [phone] (pager)

## 2013-07-08 NOTE — Progress Notes (Signed)
PULMONARY / CRITICAL CARE MEDICINE   Name: SHAREECE BULTMAN MRN: [id number] DOB: 1960-09-24    ADMISSION DATE:  06/23/2013 CONSULTATION DATE:  06/23/2013  REFERRING MD : Doyle Askew   CHIEF COMPLAINT: Shortness of breath, hemoptysis, diffuse alveolar hemorrhage   BRIEF PATIENT DESCRIPTION:  53 yo female former smoker presented with cough, hemoptysis for 2 days.  Found to have hypoxia and diffuse pulmonary infiltrates.  Serology showed P ANCA 1:640, Myeloperoxidase > 8 . PCCM assumed care in ICU.  SIGNIFICANT EVENTS: 6/15 Admit, PCCM consulted  6/16 Start high dose steroids, Transfuse PRBC 6/17 Start weekly Rituxan  6/18 Hematology consulted to assess for plasma exchange >> not indicated at this time 6/21 Transition to prednisone 6/23 second dose of  weekly rituxan  6/29 Patient continues to need NRB  STUDIES: 6/15 CT chest >> diffuse b/l GGO 6/15 P ANCA 1:640, Myeloperoxidase > 8, HIV non reactive, Aldolase 4, CPK 31, Scl 70 < 1, anti GBM < 1, CCP < 2, RF < 10, ds DNA < 1, ANA negative, ESR 90, CRP 22, SSA < 1, SSB < 1 6/15 U/A >> 100 protein, large Hgb, moderate leukocytes 6/16 Echo >> EF 55 to 60% 6/16 Lupus anticoagulant dectected 6/17 Hep B IgM negative, Hep B core Ab negative, Hep B surface Ag negative, Hep B surface Ab negative, Hep C Ab negative 6/17 G6PD >> 22.8 6/22 desatn off NRB  LINES / TUBES:  Rt PICC 6/17 >>> HD cath 6/29>>>  CULTURES:  Respiratory viral panel 6/15 >> negative Legionella 6/15 >> negative Blood 6/15 >> neg  ANTIBIOTICS:  Levaquin 6/15 >>> 6/17 Ceftriaxone 6/15 >>> one dose 6/15  Azithromycin 6/15 >>> one dose 6/15 Atovaquone 6/21 >>>   SUBJECTIVE:  Remains On NRB High level of anxiety continues Afebrile Incontinent of urine, possible bladder spasms -> UA Sugars controlled   VITAL SIGNS: Temp:  [94.6 F (34.8 C)-98.5 F (36.9 C)] 94.6 F (34.8 C) (06/30 0926) Pulse Rate:  [39-99] 70 (06/30 0700) Resp:  [13-26] 15 (06/30 0700) BP:  (99-161)/(44-104) 132/104 mmHg (06/30 0600) SpO2:  [90 %-100 %] 100 % (06/30 0700) Weight:  [143.6 kg (316 lb 9.3 oz)] 143.6 kg (316 lb 9.3 oz) (06/30 0600) NRB 15 L INTAKE / OUTPUT: Intake/Output     06/29 0701 - 06/30 0700 06/30 0701 - 07/01 0700   P.O. 720    I.V. (mL/kg) 350 (2.4)    IV Piggyback     Total Intake(mL/kg) 1070 (7.5)    Urine (mL/kg/hr) 2950 (0.9)    Total Output 2950     Net -1880          Stool Occurrence 1 x     PHYSICAL EXAMINATION: General: Speaks in full sentences, on NRB, very anxious affect. Upset at the moment. Neuro: Awake, alert, oriented. Answering questions appropriately, no speech abnormalities. Some generalized weakness. HEENT: PERRL, no scleral icterus. Oral mucosa pink and moist. Cardiovascular: RRR, no m/g/r. PICC line and HD cath in place. Lungs: Slightly tachypneic, upset. Lungs with occasional crackles. Abdomen: Soft, non-distended. BS + Musculoskeletal: No peripheral edema Skin: pale, cap refill <3 seconds, no rashes  LABS:  CBC  Recent Labs Lab 07/04/13 0410 07/07/13 0600 07/08/13 0611  WBC 25.7* 25.1* 20.3*  HGB 7.4* 7.7* 7.8*  HCT 24.2* 25.3* 26.4*  PLT 335 332 295   Coag's No results found for this basename: APTT, INR,  in the last 168 hours BMET  Recent Labs Lab 07/04/13 0410 07/07/13 0600 07/08/13 0611  NA  138 138 140  K 4.6 4.5 4.4  CL 98 96 97  CO2 31 32 32  BUN 24* 23 22  CREATININE 0.70 0.68 0.64  GLUCOSE 137* 177* 116*   Electrolytes  Recent Labs Lab 07/04/13 0410 07/07/13 0600 07/08/13 0611  CALCIUM 8.6 8.6 8.8    Sepsis Markers No results found for this basename: LATICACIDVEN, PROCALCITON, O2SATVEN,  in the last 168 hours ABG No results found for this basename: PHART, PCO2ART, PO2ART,  in the last 168 hours Liver Enzymes  Recent Labs Lab 07/08/13 0611  AST 14  ALT 35  ALKPHOS 108  BILITOT 0.9  ALBUMIN 2.8*   Cardiac Enzymes No results found for this basename: TROPONINI, PROBNP,  in  the last 168 hours Glucose  Recent Labs Lab 07/07/13 1212 07/07/13 1707 07/07/13 2021 07/08/13 0048 07/08/13 0519 07/08/13 0852  GLUCAP 216* 151* 154* 150* 123* 128*    Imaging Ct Chest Wo Contrast  07/07/2013   CLINICAL DATA:  Shortness of breath. Cough and hemoptysis for the past 2 days. Hypoxia.  EXAM: CT CHEST WITHOUT CONTRAST  TECHNIQUE: Multidetector CT imaging of the chest was performed following the standard protocol without IV contrast.  COMPARISON:  Chest CT 06/23/2013.  FINDINGS: Mediastinum: Heart size is borderline enlarged. There is no significant pericardial fluid, thickening or pericardial calcification. Multiple borderline enlarged mediastinal and hilar lymph nodes. Right upper extremity PICC with tip terminating in the right atrium. Esophagus is unremarkable in appearance.  Lungs/Pleura: Assessment of the lung parenchyma is limited by a large amount of respiratory motion. With these limitations in mind, there appears to be diffuse interstitial and patchy airspace opacities with widespread thickening of the peribronchovascular interstitium. These findings extend all the way to the pleura on today's examination (in comparison to the prior study, where there was relative subpleural sparing). High-resolution images are essentially nondiagnostic secondary to motion. Small bilateral pleural effusions layering dependently.  Upper Abdomen: The spleen is incompletely imaged, but appears mildly enlarged measuring up to 13.4 x 8.8 cm.  Musculoskeletal: There are no aggressive appearing lytic or blastic lesions noted in the visualized portions of the skeleton.  IMPRESSION: 1. Severely limited examination secondary to patient respiratory motion. With these limitations in mind, the widespread interstitial and airspace disease throughout the lungs bilaterally may reflect diffuse alveolar hemorrhage, acute respiratory distress syndrome (ARDS), and/or multilobar pneumonia. 2. Small bilateral pleural  effusions. 3. Mild splenomegaly.   Electronically Signed   By: Vinnie Langton M.D.   On: 07/07/2013 08:22   Dg Chest Port 1 View  07/08/2013   CLINICAL DATA:  Hemodialysis catheter placement.  EXAM: PORTABLE CHEST - 1 VIEW  COMPARISON:  07/04/2013  FINDINGS: New right IJ catheter, tip at the lower SVC level. Right upper extremity PICC is in similar position, tip at the level of the upper right atrium. There is no evidence of complicating pneumothorax or hemothorax. Borderline cardiomegaly for technique. Unchanged upper mediastinal contours. Diffuse interstitial and airspace opacity is likely stable when accounting for differences in technique.  IMPRESSION: 1. New right IJ catheter is in good position.  No pneumothorax. 2. Stable right upper extremity PICC. 3. Diffuse interstitial and airspace disease.   Electronically Signed   By: Jorje Guild M.D.   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 Plan: - CT Chest 6/28 reviewed - Continue solumedrol 60 Q12 (42m/kg), would NOT reduce this -  S/p 2 doses Rituxan, consider 3rd dose - Plasma exchange w/ albumin replacement to begin today (6/30) per renal, 7 tx over 2 wk, 1.0 PV removed - Ct atovaquone 6/21 for PCP prophylaxis (sulfa allergy) - F/u CXR intermittent - Oxygen to keep SpO2 > 92%, if desats , can add 6 liters under neath 100% -consider high flow nasal cannular reservoir O2 delivery  - Would not tolerate lung biopsy  At this stage, however if O2 needs remain high without improvement, may consider to have tissue dx to drive treatment further pcxr in am  - Continue negative fluid balance  Protein and Hb in U/A on admission. Plan: - Monitor renal f/x  Hx of GERD Hx constipation Plan:  - Carb-modified diet while on high dose steroids - Protonix  - Miralax daily - PRN Reglan and Zofran  Anemia associated with  alveolar hemorrhage - mild iron deficiency  Leukocytosis >> likely from steroids. Received IV iron on 6/24 Plan:  - Heme rec 6/26:      - ordered serum and urine protein electrophoresis, serum and urine immunofixation and quantification Immunoglobulins     - Ferric gluconate 125 mg IV given every other day for 3 more doses (last dose 6/28), have concerns for this agent in icu, worsen outcome?, limit this     - If no improvement in anemia,once stable Heme recommends bone marrow aspiration and biopsy to rule out underlying bone marrow pathology - Ct Atovaquone for PCP prophylaxis - F/u CBC - Transfuse for Hgb < 7 - SCD for DVT prevention  Steroid-induced hyperglycemia  Plan:  - SSI    Anxiety. Plan:  - Continue Bupropion, Celexa - PRN Xanax  Hypertension - Not on home antihypertensives, consider addition  Summary: Patient is stable at this time still with very anxious affect. Scheduled for plasmapheresis today per renal.  Lowella Dell. Reese, PA-S  Ccm time 30 min  I have fully examined this patient and agree with above findings.      Lavon Paganini. Titus Mould, MD, [place] Pgr: [facility]

## 2013-07-08 NOTE — Progress Notes (Signed)
Pt had 1st apheresis tx without problems, no s/s hypocalcemia noted

## 2013-07-08 NOTE — Progress Notes (Signed)
Pt placed on high flow nasal cannula with 97% O2 at 30 LPM.  Pt currently tolerating well at this time, vitals are WNL.  RT to monitor and assess as needed.

## 2013-07-08 NOTE — Procedures (Signed)
Hemodialysis Catheter Insertion Procedure Note [name] [id number] [birth year]  Procedure: Insertion of Central Venous Catheter Indications: Hemodialysis  Procedure Details Consent: Risks of procedure as well as the alternatives and risks of each were explained to the (patient/caregiver).  Consent for procedure obtained. Time Out: Verified patient identification, verified procedure, site/side was marked, verified correct patient position, special equipment/implants available, medications/allergies/relevent history reviewed, required imaging and test results available.  Performed  Maximum sterile technique was used including antiseptics, cap, gloves, gown, hand hygiene, mask and sheet. Skin prep: Chlorhexidine; local anesthetic administered A antimicrobial bonded/coated triple lumen catheter was placed in the right internal jugular vein using the Seldinger technique.  Evaluation Blood flow good Complications: No apparent complications Patient did tolerate procedure well. Chest X-ray ordered to verify placement.  CXR: pending.   [name], ACNP [facility] Pulmonology/Critical Care Pager [phone] or [phone]

## 2013-07-09 ENCOUNTER — Inpatient Hospital Stay (HOSPITAL_COMMUNITY): Payer: 59

## 2013-07-09 LAB — URINALYSIS, ROUTINE W REFLEX MICROSCOPIC
Bilirubin Urine: NEGATIVE
GLUCOSE, UA: NEGATIVE mg/dL
Ketones, ur: NEGATIVE mg/dL
Leukocytes, UA: NEGATIVE
Nitrite: NEGATIVE
PH: 6.5 (ref 5.0–8.0)
Protein, ur: NEGATIVE mg/dL
Specific Gravity, Urine: 1.009 (ref 1.005–1.030)
Urobilinogen, UA: 0.2 mg/dL (ref 0.0–1.0)

## 2013-07-09 LAB — UIFE/LIGHT CHAINS/TP QN, 24-HR UR
ALPHA 1 UR: DETECTED — AB
Albumin, U: DETECTED
Alpha 2, Urine: DETECTED — AB
Beta, Urine: DETECTED — AB
FREE KAPPA/LAMBDA RATIO: 4.62 ratio (ref 2.04–10.37)
FREE LAMBDA LT CHAINS, UR: 1.12 mg/dL — AB (ref 0.02–0.67)
Free Kappa Lt Chains,Ur: 5.17 mg/dL — ABNORMAL HIGH (ref 0.14–2.42)
Gamma Globulin, Urine: DETECTED — AB
TOTAL PROTEIN, URINE-UPE24: 9.7 mg/dL

## 2013-07-09 LAB — BASIC METABOLIC PANEL
BUN: 23 mg/dL (ref 6–23)
CO2: 29 mEq/L (ref 19–32)
Calcium: 8.6 mg/dL (ref 8.4–10.5)
Chloride: 101 mEq/L (ref 96–112)
Creatinine, Ser: 0.74 mg/dL (ref 0.50–1.10)
GLUCOSE: 144 mg/dL — AB (ref 70–99)
POTASSIUM: 4 meq/L (ref 3.7–5.3)
Sodium: 142 mEq/L (ref 137–147)

## 2013-07-09 LAB — GLUCOSE, CAPILLARY
GLUCOSE-CAPILLARY: 168 mg/dL — AB (ref 70–99)
GLUCOSE-CAPILLARY: 146 mg/dL — AB (ref 70–99)
Glucose-Capillary: 174 mg/dL — ABNORMAL HIGH (ref 70–99)
Glucose-Capillary: 216 mg/dL — ABNORMAL HIGH (ref 70–99)
Glucose-Capillary: 271 mg/dL — ABNORMAL HIGH (ref 70–99)
Glucose-Capillary: 275 mg/dL — ABNORMAL HIGH (ref 70–99)
Glucose-Capillary: 291 mg/dL — ABNORMAL HIGH (ref 70–99)

## 2013-07-09 LAB — PROTEIN ELECTROPHORESIS, SERUM
Albumin ELP: 50.4 % — ABNORMAL LOW (ref 55.8–66.1)
Alpha-1-Globulin: 12.9 % — ABNORMAL HIGH (ref 2.9–4.9)
Alpha-2-Globulin: 11.3 % (ref 7.1–11.8)
Beta 2: 4.2 % (ref 3.2–6.5)
Beta Globulin: 7.5 % — ABNORMAL HIGH (ref 4.7–7.2)
GAMMA GLOBULIN: 13.7 % (ref 11.1–18.8)
M-Spike, %: NOT DETECTED g/dL
Total Protein ELP: 5.3 g/dL — ABNORMAL LOW (ref 6.0–8.3)

## 2013-07-09 LAB — CBC WITH DIFFERENTIAL/PLATELET
Basophils Absolute: 0 10*3/uL (ref 0.0–0.1)
Basophils Relative: 0 % (ref 0–1)
EOS PCT: 0 % (ref 0–5)
Eosinophils Absolute: 0 10*3/uL (ref 0.0–0.7)
HCT: 25.3 % — ABNORMAL LOW (ref 36.0–46.0)
Hemoglobin: 7.5 g/dL — ABNORMAL LOW (ref 12.0–15.0)
LYMPHS PCT: 4 % — AB (ref 12–46)
Lymphs Abs: 1 10*3/uL (ref 0.7–4.0)
MCH: 25.3 pg — AB (ref 26.0–34.0)
MCHC: 29.6 g/dL — ABNORMAL LOW (ref 30.0–36.0)
MCV: 85.2 fL (ref 78.0–100.0)
Monocytes Absolute: 1.3 10*3/uL — ABNORMAL HIGH (ref 0.1–1.0)
Monocytes Relative: 5 % (ref 3–12)
NEUTROS PCT: 91 % — AB (ref 43–77)
Neutro Abs: 23.4 10*3/uL — ABNORMAL HIGH (ref 1.7–7.7)
Platelets: 247 10*3/uL (ref 150–400)
RBC: 2.97 MIL/uL — AB (ref 3.87–5.11)
RDW: 19.1 % — ABNORMAL HIGH (ref 11.5–15.5)
WBC: 25.7 10*3/uL — AB (ref 4.0–10.5)

## 2013-07-09 LAB — URINE MICROSCOPIC-ADD ON

## 2013-07-09 MED ORDER — LORAZEPAM 0.5 MG PO TABS
1.0000 mg | ORAL_TABLET | Freq: Two times a day (BID) | ORAL | Status: DC
  Administered 2013-07-09 – 2013-07-12 (×8): 1 mg via ORAL
  Filled 2013-07-09: qty 2
  Filled 2013-07-09: qty 1
  Filled 2013-07-09: qty 2
  Filled 2013-07-09: qty 1
  Filled 2013-07-09: qty 2
  Filled 2013-07-09: qty 1
  Filled 2013-07-09 (×3): qty 2

## 2013-07-09 MED ORDER — FUROSEMIDE 10 MG/ML IJ SOLN
20.0000 mg | Freq: Two times a day (BID) | INTRAMUSCULAR | Status: AC
  Administered 2013-07-09 – 2013-07-10 (×4): 20 mg via INTRAVENOUS
  Filled 2013-07-09 (×4): qty 2

## 2013-07-09 MED ORDER — ALPRAZOLAM 0.5 MG PO TABS
1.0000 mg | ORAL_TABLET | Freq: Three times a day (TID) | ORAL | Status: DC | PRN
  Administered 2013-07-09 – 2013-07-13 (×10): 1 mg via ORAL
  Filled 2013-07-09 (×13): qty 2

## 2013-07-09 NOTE — Progress Notes (Addendum)
PULMONARY / CRITICAL CARE MEDICINE   Name: [name] MRN: [id number] DOB: 23-Dec-1960    ADMISSION DATE:  06/23/2013 CONSULTATION DATE:  06/23/2013  REFERRING MD : Doyle Askew   CHIEF COMPLAINT: Shortness of breath, hemoptysis, diffuse alveolar hemorrhage   BRIEF PATIENT DESCRIPTION:  53 yo female former smoker presented with cough, hemoptysis for 2 days.  Found to have hypoxia and diffuse pulmonary infiltrates.  Serology showed P ANCA 1:640, Myeloperoxidase > 8 .  SIGNIFICANT EVENTS: 6/15 Admit, PCCM consulted  6/16 Start high dose steroids, Transfuse PRBC 6/17 Start weekly Rituxan  6/18 Hematology consulted to assess for plasma exchange >> not indicated at this time 6/21 Transition to prednisone 6/23 second dose of  weekly rituxan  6/29 Patient continues to need NRB 6/30 Started plasma exchange, weaned to HFNC  STUDIES: 6/15 CT chest >> diffuse b/l GGO 6/15 P ANCA 1:640, Myeloperoxidase > 8, HIV non reactive, Aldolase 4, CPK 31, Scl 70 < 1, anti GBM < 1, CCP < 2, RF < 10, ds DNA < 1, ANA negative, ESR 90, CRP 22, SSA < 1, SSB < 1 6/15 U/A >> 100 protein, large Hgb, moderate leukocytes 6/16 Echo >> EF 55 to 60% 6/16 Lupus anticoagulant dectected 6/17 Hep B IgM negative, Hep B core Ab negative, Hep B surface Ag negative, Hep B surface Ab negative, Hep C Ab negative 6/17 G6PD >> 22.8 6/22 desatn off NRB  LINES / TUBES:  Rt PICC 6/17 >>> HD cath 6/29>>>  CULTURES:  Respiratory viral panel 6/15 >> negative Legionella 6/15 >> negative Blood 6/15 >> neg  ANTIBIOTICS:  Levaquin 6/15 >>> 6/17 Ceftriaxone 6/15 >>> one dose 6/15  Azithromycin 6/15 >>> one dose 6/15 Atovaquone 6/21 >>>   SUBJECTIVE:  Tolerating HFNC with moderate SOB but O2 sat stable in high 90s High level of anxiety continues Afebrile Sugars poorly controlled  VITAL SIGNS: Temp:  [94.6 F (34.8 C)-98.4 F (36.9 C)] 97.4 F (36.3 C) (07/01 0406) Pulse Rate:  [55-111] 97 (07/01 0700) Resp:  [14-29] 23  (07/01 0700) BP: (108-180)/(50-163) 121/85 mmHg (07/01 0700) SpO2:  [81 %-100 %] 93 % (07/01 0700) FiO2 (%):  [86 %] 86 % (07/01 0046) Weight:  [316 lb 9.3 oz (143.6 kg)-317 lb 7.4 oz (144 kg)] 317 lb 7.4 oz (144 kg) (07/01 0500) HFNC 86% 30L  INTAKE / OUTPUT: Intake/Output     06/30 0701 - 07/01 0700 07/01 0701 - 07/02 0700   P.O. 2070    I.V. (mL/kg) 20 (0.1)    Total Intake(mL/kg) 2090 (14.5)    Urine (mL/kg/hr)     Total Output       Net +2090          Urine Occurrence 7 x    Stool Occurrence 1 x     PHYSICAL EXAMINATION: General: Speaks in full sentences, on HFNC, very anxious affect.  Neuro: Awake, alert, oriented. Answering questions appropriately, no speech abnormalities. Some generalized weakness. HEENT: PERRL, no scleral icterus. Oral mucosa pink and moist. Cardiovascular: RRR, no m/g/r. PICC line and HD cath in place. Lungs: Slightly tachypneic. Lungs clear bilaterally Abdomen: Soft, non-distended. BS + Musculoskeletal: No peripheral edema Skin: pale, cap refill <3 seconds, no rashes  LABS:  CBC  Recent Labs Lab 07/07/13 0600 07/08/13 0611 07/09/13 0500  WBC 25.1* 20.3* 25.7*  HGB 7.7* 7.8* 7.5*  HCT 25.3* 26.4* 25.3*  PLT 332 295 247   Coag's No results found for this basename: APTT, INR,  in the last 168  hours BMET  Recent Labs Lab 07/07/13 0600 07/08/13 0611 07/09/13 0500  NA 138 140 142  K 4.5 4.4 4.0  CL 96 97 101  CO2 32 32 29  BUN '23 22 23  ' CREATININE 0.68 0.64 0.74  GLUCOSE 177* 116* 144*   Electrolytes  Recent Labs Lab 07/07/13 0600 07/08/13 0611 07/09/13 0500  CALCIUM 8.6 8.8 8.6    Sepsis Markers No results found for this basename: LATICACIDVEN, PROCALCITON, O2SATVEN,  in the last 168 hours ABG No results found for this basename: PHART, PCO2ART, PO2ART,  in the last 168 hours Liver Enzymes  Recent Labs Lab 07/08/13 0611  AST 14  ALT 35  ALKPHOS 108  BILITOT 0.9  ALBUMIN 2.8*   Cardiac Enzymes No results found  for this basename: TROPONINI, PROBNP,  in the last 168 hours Glucose  Recent Labs Lab 07/08/13 0852 07/08/13 1217 07/08/13 1619 07/08/13 2016 07/08/13 2351 07/09/13 0406  GLUCAP 128* 195* 149* 180* 271* 174*    Imaging Dg Chest Port 1 View  07/09/2013   CLINICAL DATA:  Shortness of breath.  EXAM: PORTABLE CHEST - 1 VIEW  COMPARISON:  Multiple recent previous exams.  FINDINGS: 0510 hrs. No interval change in the bilateral diffuse interstitial and alveolar opacity. The right PICC line tip projects at the level of the right atrium. Right IJ central line tip overlies the proximal SVC level. Cardiopericardial silhouette is stable. Telemetry leads overlie the chest.  IMPRESSION: Persistent diffuse bilateral airspace opacity. No appreciable interval change.   Electronically Signed   By: Misty Stanley M.D.   On: 07/09/2013 07:17   Dg Chest Port 1 View  07/08/2013   CLINICAL DATA:  Hemodialysis catheter placement.  EXAM: PORTABLE CHEST - 1 VIEW  COMPARISON:  07/04/2013  FINDINGS: New right IJ catheter, tip at the lower SVC level. Right upper extremity PICC is in similar position, tip at the level of the upper right atrium. There is no evidence of complicating pneumothorax or hemothorax. Borderline cardiomegaly for technique. Unchanged upper mediastinal contours. Diffuse interstitial and airspace opacity is likely stable when accounting for differences in technique.  IMPRESSION: 1. New right IJ catheter is in good position.  No pneumothorax. 2. Stable right upper extremity PICC. 3. Diffuse interstitial and airspace disease.   Electronically Signed   By: Jorje Guild M.D.   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 CXR stable bilateral opacities  Plan: - Continue solumedrol 60 Q12 (52m/kg) - S/p 2 doses Rituxan, consider 3rd dose, pending response to plsma  xchange - Plasma exchange w/ albumin replacement started 6/30, 7 tx over 2 wk, 1.0 PV removed - Ct atovaquone 6/21 for PCP prophylaxis (sulfa allergy) - F/u CXR intermittent - HFNC, wean as tolerated - if not responding to plasma xchange I would favor open lung bx - Continue negative fluid balance, lasix addition  RENAL: Protein and Hb in U/A on admission. Plan: - Monitor renal f/x -lasix start  GI: Hx of GERD Hx constipation Plan:  - Carb-modified diet while on high dose steroids - Protonix  - Miralax daily - PRN Reglan and Zofran  HEME: Anemia associated with alveolar hemorrhage - mild iron deficiency  Leukocytosis >> likely from steroids.  Plan:      - ordered serum and urine protein electrophoresis, serum and urine immunofixation and quantification Immunoglobulins - pending - Ct Atovaquone for PCP prophylaxis -  F/u CBC, high risk pancytopenia - Transfuse for Hgb < 7 - SCD for DVT prevention  ENDO: Steroid-induced hyperglycemia  Plan:  - SSI - carb mod diet    Psych: anxiety Plan:  - Continue Bupropion, Celexa - PRN Xanax, failing this -add longer acting ativan  Summary: Continue plasma exchange with albumin replacement and wean O2 as tolerated. Transfer to step down, lasix start> i would favor open lung bx if not improved with 3-5 doses plasmax chnage  I have fully examined this patient and agree with above findings.     Lavon Paganini. Titus Mould, MD, [place] Pgr: [facility]

## 2013-07-09 NOTE — Progress Notes (Signed)
Chaplain from [facility] requested that pt. Receive a visit from a chaplain. Pt had been a pt a [facility] and was transferred. Pt was very stressful when chaplain visited. She and her visitor(sister) had an argument and pt was upset and her breathing was very labored. Chaplain offered a listening ear, assisted pt with calming down and prayer. Possible follow up.  Charyl Dancer, Chaplain  07/09/13 0800  Clinical Encounter Type  Visited With Patient  Visit Type Spiritual support;Social support  Referral From Chaplain  Consult/Referral To Chaplain  Spiritual Encounters  Spiritual Needs Emotional;Prayer  Stress Factors  Patient Stress Factors Family relationships;Major life changes  Family Stress Factors None identified

## 2013-07-09 NOTE — Progress Notes (Signed)
Physical Therapy Treatment Patient Details Name: [name] MRN: [id number] DOB: Jul 31, 1960 Today's Date: 07/09/2013    History of Present Illness 53 yo female admited 06/23/13 withShortness of breath, hemoptysis, diffuse alveolar hemorrhage . Pt has  been in respiratory distress. Pt highly anxious.      PT Comments    Pt admitted with above. Pt currently with functional limitations due to anxiety and endurance deficits.  Pt will benefit from skilled PT to increase their independence and safety with mobility to allow discharge to the venue listed below.    Follow Up Recommendations  SNF;Supervision/Assistance - 24 hour     Equipment Recommendations  None recommended by PT    Recommendations for Other Services OT consult     Precautions / Restrictions Precautions Precautions: Fall Precaution Comments: chemo, monitor sats, plasmaphoresis, poor safety due to anxiety Restrictions Weight Bearing Restrictions: No    Mobility  Bed Mobility Overal bed mobility: Needs Assistance;+ 2 for safety/equipment Bed Mobility: Supine to Sit;Sit to Supine     Supine to sit: Mod assist;HOB elevated Sit to supine: Total assist   General bed mobility comments: Pt needs guard assist throughout.  Transfers Overall transfer level: Needs assistance Equipment used: None Transfers: Sit to/from Stand Sit to Stand: Mod assist         General transfer comment: Pt attempted to stand with assist from bed to get to 3N1.  Pt could not safely stand and pivot to 3N1.  Pt with high anxiety.  Pt attempted x 2 but PT felt it unsafe as pt was not fully upright, anxiety increased and sats began to dip in high 70's.  Instructed in breathing techniques to get pt to relax to return O2 sats to high 80's.  With max encouragement and time, pt finally focused enough to lie down so she could relax and get sats >90%.  Took incr time to get pt slid up on bed due to pt anxiety.    Ambulation/Gait              General Gait Details: Unable due to O2 requirement, decr sats and pt anxiety.   Stairs            Wheelchair Mobility    Modified Rankin (Stroke Patients Only)       Balance Overall balance assessment: Needs assistance;History of Falls Sitting-balance support: Bilateral upper extremity supported;Feet supported Sitting balance-Leahy Scale: Fair Sitting balance - Comments: Sat EOB x 30 min as pt so anxious needed incr time to calm down to try mobility.  Unsuccessful largely due to anxiety.     Standing balance support: Bilateral upper extremity supported;During functional activity Standing balance-Leahy Scale: Poor Standing balance comment: Could not stand fully.                      Cognition Arousal/Alertness: Awake/alert Behavior During Therapy: Anxious;Restless Overall Cognitive Status: Impaired/Different from baseline Area of Impairment: Safety/judgement;Problem solving;Following commands       Following Commands: Follows one step commands with increased time;Follows one step commands inconsistently Safety/Judgement: Decreased awareness of safety;Decreased awareness of deficits   Problem Solving: Requires verbal cues;Difficulty sequencing General Comments: Pt anxiety limits mobility.      Exercises General Exercises - Lower Extremity Ankle Circles/Pumps: AROM;Both;10 reps;Supine Long Arc Quad: AROM;Both;10 reps;Seated    General Comments        Pertinent Vitals/Pain Desat to high 70's with attempts to 3N1.  Improved with relaxation techniques to high 80's.  O2 sats >  90% once positioned back in bed.  Appears to desat largely due to anxiety.  Other VSS, No pain.    Home Living Family/patient expects to be discharged to:: Private residence Living Arrangements: Spouse/significant other Available Help at Discharge: Family;Available PRN/intermittently Type of Home: Mobile home Home Access: Stairs to enter   Home Layout: One level Home Equipment:  Environmental consultant - 2 wheels      Prior Function            PT Goals (current goals can now be found in the care plan section) Progress towards PT goals: Progressing toward goals    Frequency  Min 3X/week    PT Plan Current plan remains appropriate    Co-evaluation             End of Session Equipment Utilized During Treatment: Gait belt;Oxygen Activity Tolerance: Patient limited by fatigue (also greatly limited by anxiety) Patient left: in bed;with call bell/phone within reach;with bed alarm set     Time: [id number] PT Time Calculation (min): 62 min  Charges:  $Therapeutic Exercise: 8-22 mins $Therapeutic Activity: 23-37 mins $Self Care/Home Management: 8-22                    G Codes:      INGOLD,[name] 07-25-2013, 10:31 AM [facility] [phone] (pager)

## 2013-07-09 NOTE — Progress Notes (Signed)
Patient reported that she needed to use restroom at beginning of shift. This RN assisted her to [facility]. Upon trying to get back in bed patient felt as if her oxygen was not flow, became extremely anxious and proceeded to sit on the floor. Attempted to stand patient up with the assistance of 2 other staff members and patient was not able to assist with her legs due to fatigue. Sky lift used to move patient back to bed with the assistance of 2 other staff members. Patient has no complaints of pain or injury. Vital signs stable. Patient in no acute distress.

## 2013-07-09 NOTE — Progress Notes (Addendum)
  [facility] Progress Note   Subjective: No pheresis yesterday, 2nd pheresis done this am without complication. Pt says she is feeling better, nothing specific though   Filed Vitals:   07/09/13 0922 07/09/13 1000 07/09/13 1100 07/09/13 1239  BP: 121/56  86/66   Pulse: 100 113 97 94  Temp:      TempSrc:      Resp: 22 29 19 28   Height:      Weight:      SpO2: 95% 88% 95% 97%   Exam: A little calmer perhaps, no distress, [place] O2 No JVD  Chest bilat post rales most of lung fields RRR no MRG  Abd obese, soft, NTND  Trace ankle edema bilat  Neuro is nf, Ox3  UA- neg protein, 3-6 wbc, 7-10 rb Creat 0.7- 0.8 CXR diffuse dense bilat infiltrates  CT chest - same as CXR   Assessment:  1 Resp failure / hemoptysis / pulm infiltrates- 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, for 2 wk course of plasmapheresis qod 2 Obesity  3 Anxiety / depression  Plan- as above    Kelly Splinter MD  pager [phone]    cell [phone]  07/09/2013, 12:44 PM     Recent Labs Lab 07/07/13 0600 07/08/13 0611 07/09/13 0500  NA 138 140 142  K 4.5 4.4 4.0  CL 96 97 101  CO2 32 32 29  GLUCOSE 177* 116* 144*  BUN 23 22 23   CREATININE 0.68 0.64 0.74  CALCIUM 8.6 8.8 8.6    Recent Labs Lab 07/08/13 0611  AST 14  ALT 35  ALKPHOS 108  BILITOT 0.9  PROT 6.1  ALBUMIN 2.8*    Recent Labs Lab 07/07/13 0600 07/08/13 0611 07/09/13 0500  WBC 25.1* 20.3* 25.7*  NEUTROABS  --   --  23.4*  HGB 7.7* 7.8* 7.5*  HCT 25.3* 26.4* 25.3*  MCV 83.0 85.7 85.2  PLT 332 295 247   . antiseptic oral rinse  15 mL Mouth Rinse BID  . atovaquone  1,500 mg Oral Q breakfast  . buPROPion  300 mg Oral Daily  . citalopram  40 mg Oral Daily  . folic acid  5 mg Oral Daily  . furosemide  20 mg Intravenous Q12H  . heparin  1,000 Units Intracatheter Once  . insulin aspart  0-20 Units Subcutaneous TID WC  . LORazepam  1 mg Oral BID  . methylPREDNISolone (SOLU-MEDROL) injection  60 mg Intravenous Q12H   . pantoprazole  40 mg Oral QHS  . sodium chloride  10-40 mL Intracatheter Q12H   . sodium chloride 10 mL/hr at 07/09/13 0700  . citrate dextrose     acetaminophen, ALPRAZolam, diphenhydrAMINE, fentaNYL, heparin, HYDROcodone-acetaminophen, metoCLOPramide (REGLAN) injection, ondansetron (ZOFRAN) IV, polyethylene glycol, sodium chloride

## 2013-07-10 ENCOUNTER — Inpatient Hospital Stay (HOSPITAL_COMMUNITY): Payer: 59

## 2013-07-10 LAB — BASIC METABOLIC PANEL
ANION GAP: 13 (ref 5–15)
BUN: 23 mg/dL (ref 6–23)
CALCIUM: 8.5 mg/dL (ref 8.4–10.5)
CO2: 33 mEq/L — ABNORMAL HIGH (ref 19–32)
Chloride: 95 mEq/L — ABNORMAL LOW (ref 96–112)
Creatinine, Ser: 0.72 mg/dL (ref 0.50–1.10)
GFR calc Af Amer: >90 mL/min (ref >90)
Glucose, Bld: 176 mg/dL — ABNORMAL HIGH (ref 70–99)
Potassium: 3.8 mEq/L (ref 3.7–5.3)
SODIUM: 141 meq/L (ref 137–147)

## 2013-07-10 LAB — CBC
HCT: 25 % — ABNORMAL LOW (ref 36.0–46.0)
HEMOGLOBIN: 7.4 g/dL — AB (ref 12.0–15.0)
MCH: 25.2 pg — ABNORMAL LOW (ref 26.0–34.0)
MCHC: 29.6 g/dL — AB (ref 30.0–36.0)
MCV: 85 fL (ref 78.0–100.0)
Platelets: 192 10*3/uL (ref 150–400)
RBC: 2.94 MIL/uL — AB (ref 3.87–5.11)
RDW: 19.5 % — ABNORMAL HIGH (ref 11.5–15.5)
WBC: 17.4 10*3/uL — ABNORMAL HIGH (ref 4.0–10.5)

## 2013-07-10 LAB — MAGNESIUM: Magnesium: 1.9 mg/dL (ref 1.5–2.5)

## 2013-07-10 LAB — GLUCOSE, CAPILLARY
GLUCOSE-CAPILLARY: 238 mg/dL — AB (ref 70–99)
GLUCOSE-CAPILLARY: 184 mg/dL — AB (ref 70–99)
Glucose-Capillary: 132 mg/dL — ABNORMAL HIGH (ref 70–99)
Glucose-Capillary: 173 mg/dL — ABNORMAL HIGH (ref 70–99)
Glucose-Capillary: 221 mg/dL — ABNORMAL HIGH (ref 70–99)

## 2013-07-10 LAB — PHOSPHORUS: PHOSPHORUS: 3.5 mg/dL (ref 2.3–4.6)

## 2013-07-10 MED ORDER — SODIUM CHLORIDE 0.9 % IV SOLN
4.0000 g | Freq: Once | INTRAVENOUS | Status: AC
  Administered 2013-07-10: 4 g via INTRAVENOUS
  Filled 2013-07-10: qty 40

## 2013-07-10 MED ORDER — SODIUM CHLORIDE 0.9 % IV SOLN
INTRAVENOUS | Status: AC
  Administered 2013-07-10 (×4): via INTRAVENOUS_CENTRAL
  Filled 2013-07-10 (×4): qty 200

## 2013-07-10 MED ORDER — HYDRALAZINE HCL 20 MG/ML IJ SOLN: 10.0000 mg | INTRAMUSCULAR | Status: DC | PRN

## 2013-07-10 MED ORDER — CALCIUM CARBONATE ANTACID 500 MG PO CHEW
2.0000 | CHEWABLE_TABLET | ORAL | Status: AC
  Administered 2013-07-10 (×2): 400 mg via ORAL
  Filled 2013-07-10 (×2): qty 2

## 2013-07-10 MED ORDER — ACD FORMULA A 0.73-2.45-2.2 GM/100ML VI SOLN
Status: AC
  Administered 2013-07-10: 500 mL via INTRAVENOUS
  Filled 2013-07-10: qty 500

## 2013-07-10 MED ORDER — DIPHENHYDRAMINE HCL 25 MG PO CAPS
25.0000 mg | ORAL_CAPSULE | Freq: Four times a day (QID) | ORAL | Status: DC | PRN
  Administered 2013-07-10: 25 mg via ORAL

## 2013-07-10 MED ORDER — CALCIUM CARBONATE ANTACID 500 MG PO CHEW
CHEWABLE_TABLET | ORAL | Status: AC
  Administered 2013-07-10: 400 mg via ORAL
  Filled 2013-07-10: qty 2

## 2013-07-10 MED ORDER — ACETAMINOPHEN 325 MG PO TABS: 650.0000 mg | ORAL_TABLET | ORAL | Status: DC | PRN

## 2013-07-10 MED ORDER — METHYLPREDNISOLONE SODIUM SUCC 125 MG IJ SOLR
50.0000 mg | Freq: Two times a day (BID) | INTRAMUSCULAR | Status: DC
  Administered 2013-07-10 – 2013-07-13 (×6): 50 mg via INTRAVENOUS
  Filled 2013-07-10 (×7): qty 0.8
  Filled 2013-07-10: qty 2
  Filled 2013-07-10: qty 0.8

## 2013-07-10 MED ORDER — MAGNESIUM SULFATE 40 MG/ML IJ SOLN
2.0000 g | Freq: Once | INTRAMUSCULAR | Status: AC
  Administered 2013-07-10: 2 g via INTRAVENOUS
  Filled 2013-07-10: qty 50

## 2013-07-10 MED ORDER — ACD FORMULA A 0.73-2.45-2.2 GM/100ML VI SOLN
500.0000 mL | Status: DC
  Administered 2013-07-10: 500 mL via INTRAVENOUS
  Filled 2013-07-10: qty 500

## 2013-07-10 MED ORDER — HEPARIN SODIUM (PORCINE) 1000 UNIT/ML IJ SOLN: 1000.0000 [IU] | Freq: Once | INTRAMUSCULAR | Status: DC

## 2013-07-10 MED ORDER — POTASSIUM CHLORIDE CRYS ER 20 MEQ PO TBCR
20.0000 meq | EXTENDED_RELEASE_TABLET | Freq: Once | ORAL | Status: AC
  Administered 2013-07-10: 20 meq via ORAL
  Filled 2013-07-10: qty 1

## 2013-07-10 MED ORDER — FUROSEMIDE 10 MG/ML IJ SOLN
INTRAMUSCULAR | Status: AC
  Filled 2013-07-10: qty 4

## 2013-07-10 NOTE — Progress Notes (Signed)
PULMONARY / CRITICAL CARE MEDICINE   Name: [name] MRN: [id number] DOB: 01-14-1960    ADMISSION DATE:  06/23/2013 CONSULTATION DATE:  06/23/2013  REFERRING MD : Doyle Askew   CHIEF COMPLAINT: Shortness of breath, hemoptysis, diffuse alveolar hemorrhage   BRIEF PATIENT DESCRIPTION:  53 yo female former smoker presented with cough, hemoptysis for 2 days.  Found to have hypoxia and diffuse pulmonary infiltrates.  Serology showed P ANCA 1:640, Myeloperoxidase > 8 .  SIGNIFICANT EVENTS: 6/15 Admit, PCCM consulted  6/16 Start high dose steroids, Transfuse PRBC 6/17 Start weekly Rituxan  6/18 Hematology consulted to assess for plasma exchange >> not indicated at this time 6/21 Transition to prednisone 6/23 second dose of  weekly rituxan  6/29 Patient continues to need NRB 6/30 Started plasma exchange, weaned to HFNC 7/2- neg 5 liters, plasma xch #2, same O2 needs 30 liters  STUDIES: 6/15 CT chest >> diffuse b/l GGO 6/15 P ANCA 1:640, Myeloperoxidase > 8, HIV non reactive, Aldolase 4, CPK 31, Scl 70 < 1, anti GBM < 1, CCP < 2, RF < 10, ds DNA < 1, ANA negative, ESR 90, CRP 22, SSA < 1, SSB < 1 6/15 U/A >> 100 protein, large Hgb, moderate leukocytes 6/16 Echo >> EF 55 to 60% 6/16 Lupus anticoagulant dectected 6/17 Hep B IgM negative, Hep B core Ab negative, Hep B surface Ag negative, Hep B surface Ab negative, Hep C Ab negative 6/17 G6PD >> 22.8 6/22 desatn off NRB  LINES / TUBES:  Rt PICC 6/17 >>> HD cath 6/29>>>  CULTURES:  Respiratory viral panel 6/15 >> negative Legionella 6/15 >> negative Blood 6/15 >> neg  ANTIBIOTICS:  Levaquin 6/15 >>> 6/17 Ceftriaxone 6/15 >>> one dose 6/15  Azithromycin 6/15 >>> one dose 6/15 Atovaquone 6/21 >>>   SUBJECTIVE:  Tolerating HFNC with moderate SOB but O2 sat stable in high 90s High level of anxiety continues Afebrile Sugars poorly controlled  VITAL SIGNS: Temp:  [97.4 F (36.3 C)-99 F (37.2 C)] 97.4 F (36.3 C) (07/02  0402) Pulse Rate:  [80-117] 80 (07/02 0600) Resp:  [12-31] 21 (07/02 0600) BP: (86-160)/(53-115) 115/76 mmHg (07/02 0600) SpO2:  [88 %-100 %] 96 % (07/02 0600) FiO2 (%):  [79.8 %-85 %] 79.8 % (07/01 1239) Weight:  [313 lb 0.9 oz (142 kg)] 313 lb 0.9 oz (142 kg) (07/02 0405) HFNC 86% 30L  INTAKE / OUTPUT: Intake/Output     07/01 0701 - 07/02 0700 07/02 0701 - 07/03 0700   P.O.     I.V. (mL/kg) 230 (1.6)    Total Intake(mL/kg) 230 (1.6)    Urine (mL/kg/hr) 5175 (1.5)    Total Output 5175     Net -4945          Urine Occurrence 2 x     PHYSICAL EXAMINATION: General: Speaks in full sentences, on HFNC, very anxious affect.  Neuro: Awake, alert, oriented. Answering questions appropriately, no speech abnormalities. Some generalized weakness. HEENT: PERRL, no scleral icterus. Oral mucosa pink and moist. Cardiovascular: RRR, no m/g/r. PICC line and HD cath in place. Lungs: Slightly tachypneic. Lungs clear bilaterally Abdomen: Soft, non-distended. BS + Musculoskeletal: No peripheral edema  LABS:  CBC  Recent Labs Lab 07/07/13 0600 07/08/13 0611 07/09/13 0500  WBC 25.1* 20.3* 25.7*  HGB 7.7* 7.8* 7.5*  HCT 25.3* 26.4* 25.3*  PLT 332 295 247   Coag's No results found for this basename: APTT, INR,  in the last 168 hours BMET  Recent Labs Lab 07/08/13  3220 07/09/13 0500 07/10/13 0500  NA 140 142 141  K 4.4 4.0 3.8  CL 97 101 95*  CO2 32 29 33*  BUN _0 CREATININE 0.64 0.74 0.72  GLUCOSE 116* 144* 176*   Electrolytes  Recent Labs Lab 07/08/13 0611 07/09/13 0500 07/10/13 0500  CALCIUM 8.8 8.6 8.5  MG  --   --  1.9  PHOS  --   --  3.5    Sepsis Markers No results found for this basename: LATICACIDVEN, PROCALCITON, O2SATVEN,  in the last 168 hours ABG No results found for this basename: PHART, PCO2ART, PO2ART,  in the last 168 hours Liver Enzymes  Recent Labs Lab 07/08/13 0611  AST 14  ALT 35  ALKPHOS 108  BILITOT 0.9  ALBUMIN 2.8*   Cardiac  Enzymes No results found for this basename: TROPONINI, PROBNP,  in the last 168 hours Glucose  Recent Labs Lab 07/09/13 0822 07/09/13 1158 07/09/13 1524 07/09/13 1907 07/09/13 2337 07/10/13 0401  GLUCAP 275* 168* 146* 216* 291* 132*    Imaging Dg Chest Port 1 View  07/10/2013   CLINICAL DATA:  Assess infiltrates.  Shortness of breath  EXAM: PORTABLE CHEST - 1 VIEW  COMPARISON:  07/09/2013  FINDINGS: A right IJ dialysis catheter remains in good position. A right upper extremity PICC is also stable, tip at the upper right atrial level.  Unchanged cardiomegaly.  Unchanged widening of the vascular pedicle.  When accounting for differences in penetration, there is likely no change in diffuse interstitial and airspace disease. Lung volumes remain generally low. No pneumothorax.  IMPRESSION: 1. No change in diffuse interstitial and airspace disease. 2. Central venous access is in stable position.   Electronically Signed   By: Jorje Guild M.D.   On: 07/10/2013 06:52   Dg Chest Port 1 View  07/09/2013   CLINICAL DATA:  Shortness of breath.  EXAM: PORTABLE CHEST - 1 VIEW  COMPARISON:  Multiple recent previous exams.  FINDINGS: 0510 hrs. No interval change in the bilateral diffuse interstitial and alveolar opacity. The right PICC line tip projects at the level of the right atrium. Right IJ central line tip overlies the proximal SVC level. Cardiopericardial silhouette is stable. Telemetry leads overlie the chest.  IMPRESSION: Persistent diffuse bilateral airspace opacity. No appreciable interval change.   Electronically Signed   By: Misty Stanley M.D.   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 CXR stable bilateral opacities  Plan: - Continue solumedrol 60 Q12 (60m/kg), not much improvement noted, reduce slowly - S/p 2 doses Rituxan, consider 3rd dose, pending  response to plsma xchange - Plasma exchange w/ albumin replacement started 6/30, 7 tx over 2 wk, 1.0 PV removed - Ct atovaquone 6/21 for PCP prophylaxis (sulfa allergy) - F/u CXR intermittent - HFNC, wean as tolerated - if not responding to plasma xchange I would favor open lung bx, re assess afer 3rd treatment - Continue negative fluid balance, lasix made 5 liters neg and she tolerated well, but O2 needs not better yet -consider cytoxan   RENAL: Protein and Hb in U/A on admission. Plan: - Monitor renal f/x - lasix maintain -k, mag supp  GI: Hx of GERD Hx constipation Plan:  - Carb-modified diet while on high dose steroids - Protonix  - Miralax daily - PRN Reglan and Zofran  HEME: Anemia associated with alveolar hemorrhage -  mild iron deficiency  Leukocytosis >> likely from steroids.  Plan:      - ordered serum and urine protein electrophoresis, serum and urine immunofixation and quantification Immunoglobulins - pending - Ct Atovaquone for PCP prophylaxis - F/u CBC, high risk pancytopenia - Transfuse for Hgb < 7 - SCD for DVT prevention  ENDO: Steroid-induced hyperglycemia  Plan:  - SSI - carb mod diet    Psych: anxiety Plan:  - Continue Bupropion, Celexa - PRN Xanax - ativan 1 mg bid, may need to increase  Summary: plasmax change today, lasix continued, ativan  Beverlyn Roux, MD, MPH [facility] PGY-2 07/10/2013 7:31 AM  I have fully examined this patient and agree with above findings.     Lavon Paganini. Titus Mould, MD, [place] Pgr: [facility]

## 2013-07-10 NOTE — Progress Notes (Signed)
Water bag changed

## 2013-07-10 NOTE — Procedures (Signed)
I was present at this pheresis session, have reviewed the session itself and made  appropriate changes  Kelly Splinter MD (pgr) [phone]    ([phone] 07/10/2013, 1:21 PM

## 2013-07-10 NOTE — Procedures (Signed)
Supervised by me  Rigoberto Noel MD

## 2013-07-11 DIAGNOSIS — D508 Other iron deficiency anemias: Secondary | ICD-10-CM

## 2013-07-11 LAB — BASIC METABOLIC PANEL
ANION GAP: 12 (ref 5–15)
BUN: 25 mg/dL — AB (ref 6–23)
CO2: 34 mEq/L — ABNORMAL HIGH (ref 19–32)
Calcium: 8.9 mg/dL (ref 8.4–10.5)
Chloride: 96 mEq/L (ref 96–112)
Creatinine, Ser: 0.76 mg/dL (ref 0.50–1.10)
Glucose, Bld: 132 mg/dL — ABNORMAL HIGH (ref 70–99)
POTASSIUM: 4.2 meq/L (ref 3.7–5.3)
Sodium: 142 mEq/L (ref 137–147)

## 2013-07-11 LAB — MAGNESIUM: MAGNESIUM: 2.3 mg/dL (ref 1.5–2.5)

## 2013-07-11 LAB — CBC
HEMATOCRIT: 27.4 % — AB (ref 36.0–46.0)
Hemoglobin: 8.1 g/dL — ABNORMAL LOW (ref 12.0–15.0)
MCH: 25.3 pg — ABNORMAL LOW (ref 26.0–34.0)
MCHC: 29.6 g/dL — AB (ref 30.0–36.0)
MCV: 85.6 fL (ref 78.0–100.0)
PLATELETS: 210 10*3/uL (ref 150–400)
RBC: 3.2 MIL/uL — ABNORMAL LOW (ref 3.87–5.11)
RDW: 20.6 % — AB (ref 11.5–15.5)
WBC: 19.8 10*3/uL — ABNORMAL HIGH (ref 4.0–10.5)

## 2013-07-11 LAB — PHOSPHORUS: Phosphorus: 4.2 mg/dL (ref 2.3–4.6)

## 2013-07-11 LAB — GLUCOSE, CAPILLARY
Glucose-Capillary: 104 mg/dL — ABNORMAL HIGH (ref 70–99)
Glucose-Capillary: 121 mg/dL — ABNORMAL HIGH (ref 70–99)
Glucose-Capillary: 158 mg/dL — ABNORMAL HIGH (ref 70–99)
Glucose-Capillary: 250 mg/dL — ABNORMAL HIGH (ref 70–99)

## 2013-07-11 MED ORDER — INSULIN ASPART 100 UNIT/ML ~~LOC~~ SOLN
7.0000 [IU] | Freq: Once | SUBCUTANEOUS | Status: AC
Start: 2013-07-11 — End: 2013-07-11
  Administered 2013-07-11: 7 [IU] via SUBCUTANEOUS

## 2013-07-11 NOTE — Progress Notes (Signed)
[facility] Progress Note Patient Name: [name] DOB: Feb 17, 1960 MRN: [id number]  Date of Service  07/11/2013   HPI/Events of Note  Hyperglycemia with blood sugar greater than 250.  On prednisone and AC/HS insulin coverage.   eICU Interventions  Plan: One time dose of 7 units of novolog subq   Intervention Category Intermediate Interventions: Hyperglycemia - evaluation and treatment  Willa Brocks 07/11/2013, 10:48 PM

## 2013-07-11 NOTE — Progress Notes (Signed)
PT Cancellation Note  Patient Details Name: [name] MRN: [id number] DOB: April 15, 1960   Cancelled Treatment:    Reason Eval/Treat Not Completed: Fatigue/lethargy limiting ability to participate. RN reported pt was given Ativan recently. Pt sleeping soundly (snoring) and not arousing. Will re-attempt as schedule permits.   [name] 07/11/2013, 12:12 PM Pager [phone]

## 2013-07-11 NOTE — Progress Notes (Signed)
  [facility] Progress Note   Subjective: Feeling a little better today, thinks her breathing is better overall compared to a few days or a week ago  Filed Vitals:   07/11/13 0340 07/11/13 0400 07/11/13 0825 07/11/13 0853  BP: 137/77 136/73 134/77   Pulse: 84 83 84   Temp:  97.9 F (36.6 C) 97.7 F (36.5 C)   TempSrc:  Oral Oral   Resp: 12 12 22    Height:      Weight:  141.1 kg (311 lb 1.1 oz)    SpO2: 100% 99% 94% 98%   Exam: A little calmer perhaps, no distress, [place] O2 No JVD  Chest bilat post rales most of lung fields RRR no MRG  Abd obese, soft, NTND  Trace ankle edema bilat  Neuro is nf, Ox3  UA- neg protein, 3-6 wbc, 7-10 rb Creat 0.7- 0.8 CXR diffuse bilat infiltrates look improved compared to 6/26   Assessment:  1 Resp failure / hemoptysis / pulm infiltrates- 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, for 2 wk course of plasmapheresis qod 2 Obesity  3 Anxiety / depression  Plan- next pheresis tomorrow    Kelly Splinter MD  pager [phone]    cell [phone]  07/11/2013, 10:03 AM     Recent Labs Lab 07/09/13 0500 07/10/13 0500 07/11/13 0530  NA 142 141 142  K 4.0 3.8 4.2  CL 101 95* 96  CO2 29 33* 34*  GLUCOSE 144* 176* 132*  BUN 23 23 25*  CREATININE 0.74 0.72 0.76  CALCIUM 8.6 8.5 8.9  PHOS  --  3.5 4.2    Recent Labs Lab 07/08/13 0611  AST 14  ALT 35  ALKPHOS 108  BILITOT 0.9  PROT 6.1  ALBUMIN 2.8*    Recent Labs Lab 07/09/13 0500 07/10/13 0800 07/11/13 0530  WBC 25.7* 17.4* 19.8*  NEUTROABS 23.4*  --   --   HGB 7.5* 7.4* 8.1*  HCT 25.3* 25.0* 27.4*  MCV 85.2 85.0 85.6  PLT 247 192 210   . antiseptic oral rinse  15 mL Mouth Rinse BID  . atovaquone  1,500 mg Oral Q breakfast  . buPROPion  300 mg Oral Daily  . citalopram  40 mg Oral Daily  . folic acid  5 mg Oral Daily  . heparin  1,000 Units Intracatheter Once  . heparin  1,000 Units Intracatheter Once  . insulin aspart  0-20 Units Subcutaneous TID WC  .  LORazepam  1 mg Oral BID  . methylPREDNISolone (SOLU-MEDROL) injection  50 mg Intravenous Q12H  . pantoprazole  40 mg Oral QHS  . sodium chloride  10-40 mL Intracatheter Q12H   . sodium chloride 10 mL/hr at 07/09/13 0700  . citrate dextrose    . citrate dextrose     acetaminophen, acetaminophen, ALPRAZolam, diphenhydrAMINE, diphenhydrAMINE, fentaNYL, heparin, hydrALAZINE, HYDROcodone-acetaminophen, metoCLOPramide (REGLAN) injection, ondansetron (ZOFRAN) IV, polyethylene glycol, sodium chloride

## 2013-07-11 NOTE — Progress Notes (Signed)
PULMONARY / CRITICAL CARE MEDICINE   Name: [name] MRN: [id number] DOB: September 30, 1960    ADMISSION DATE:  06/23/2013 CONSULTATION DATE:  06/23/2013  REFERRING MD : Doyle Askew   CHIEF COMPLAINT: Shortness of breath, hemoptysis, diffuse alveolar hemorrhage   BRIEF PATIENT DESCRIPTION:  53 yo female former smoker presented with cough, hemoptysis for 2 days.  Found to have hypoxia and diffuse pulmonary infiltrates.  Serology showed P ANCA 1:640, Myeloperoxidase > 8 .  SIGNIFICANT EVENTS: 6/15 Admit, PCCM consulted  6/16 Start high dose steroids, Transfuse PRBC 6/17 Start weekly Rituxan  6/18 Hematology consulted to assess for plasma exchange >> not indicated at this time 6/21 Transition to prednisone 6/23 second dose of  weekly rituxan  6/29 Patient continues to need NRB 6/30 Started plasma exchange, weaned to HFNC 7/2- neg 5 liters, plasma xch #2, same O2 needs 30 liters  STUDIES: 6/15 CT chest >> diffuse b/l GGO 6/15 P ANCA 1:640, Myeloperoxidase > 8, HIV non reactive, Aldolase 4, CPK 31, Scl 70 < 1, anti GBM < 1, CCP < 2, RF < 10, ds DNA < 1, ANA negative, ESR 90, CRP 22, SSA < 1, SSB < 1 6/15 U/A >> 100 protein, large Hgb, moderate leukocytes 6/16 Echo >> EF 55 to 60% 6/16 Lupus anticoagulant dectected 6/17 Hep B IgM negative, Hep B core Ab negative, Hep B surface Ag negative, Hep B surface Ab negative, Hep C Ab negative 6/17 G6PD >> 22.8 6/22 desatn off NRB  LINES / TUBES:  Rt PICC 6/17 >>> HD cath 6/29>>>  CULTURES:  Respiratory viral panel 6/15 >> negative Legionella 6/15 >> negative Blood 6/15 >> neg  ANTIBIOTICS:  Levaquin 6/15 >>> 6/17 Ceftriaxone 6/15 >>> one dose 6/15  Azithromycin 6/15 >>> one dose 6/15 Atovaquone 6/21 >>>   SUBJECTIVE:  Husband here Tolerating HFNC with improved O2 ,sat stable in high 90s Woke with sore throat High level of anxiety continues Afebrile Sugars poorly controlled  VITAL SIGNS: Temp:  [97.7 F (36.5 C)-98.1 F (36.7 C)]  97.7 F (36.5 C) (07/03 0825) Pulse Rate:  [82-122] 84 (07/03 0825) Resp:  [12-35] 22 (07/03 0825) BP: (102-151)/(49-95) 134/77 mmHg (07/03 0825) SpO2:  [77 %-100 %] 98 % (07/03 0853) FiO2 (%):  [60 %-80 %] 60 % (07/03 0853) Weight:  [140.8 kg (310 lb 6.5 oz)-141.1 kg (311 lb 1.1 oz)] 141.1 kg (311 lb 1.1 oz) (07/03 0400) HFNC 86% 30L  INTAKE / OUTPUT: Intake/Output     07/02 0701 - 07/03 0700 07/03 0701 - 07/04 0700   P.O. 120    I.V. (mL/kg) 230 (1.6)    IV Piggyback 300    Total Intake(mL/kg) 650 (4.6)    Urine (mL/kg/hr) 5925 (1.7)    Total Output 5925     Net -5275           PHYSICAL EXAMINATION: General: Asleep, woke easily, Speaks in full sentences, on HFNC,   Neuro: Awake, alert, oriented. Answering questions appropriately, no speech abnormalities. Some generalized weakness. HEENT: PERRL, no scleral icterus. Mucosa clear Cardiovascular: RRR, no m/g/r. PICC line and HD cath in place. Lungs: Slightly tachypneic, shallow. Lungs clear bilaterally Abdomen: Soft, non-distended. BS + Musculoskeletal: No peripheral edema  LABS:  CBC  Recent Labs Lab 07/09/13 0500 07/10/13 0800 07/11/13 0530  WBC 25.7* 17.4* 19.8*  HGB 7.5* 7.4* 8.1*  HCT 25.3* 25.0* 27.4*  PLT 247 192 210   Coag's No results found for this basename: APTT, INR,  in the last 168  hours BMET  Recent Labs Lab 07/09/13 0500 07/10/13 0500 07/11/13 0530  NA 142 141 142  K 4.0 3.8 4.2  CL 101 95* 96  CO2 29 33* 34*  BUN 23 23 25*  CREATININE 0.74 0.72 0.76  GLUCOSE 144* 176* 132*   Electrolytes  Recent Labs Lab 07/09/13 0500 07/10/13 0500 07/11/13 0530  CALCIUM 8.6 8.5 8.9  MG  --  1.9 2.3  PHOS  --  3.5 4.2    Sepsis Markers No results found for this basename: LATICACIDVEN, PROCALCITON, O2SATVEN,  in the last 168 hours ABG No results found for this basename: PHART, PCO2ART, PO2ART,  in the last 168 hours Liver Enzymes  Recent Labs Lab 07/08/13 0611  AST 14  ALT 35  ALKPHOS  108  BILITOT 0.9  ALBUMIN 2.8*   Cardiac Enzymes No results found for this basename: TROPONINI, PROBNP,  in the last 168 hours Glucose  Recent Labs Lab 07/10/13 0401 07/10/13 0835 07/10/13 1159 07/10/13 1448 07/10/13 2213 07/11/13 0823  GLUCAP 132* 238* 184* 173* 221* 104*    Imaging Dg Chest Port 1 View  07/10/2013   CLINICAL DATA:  Assess infiltrates.  Shortness of breath  EXAM: PORTABLE CHEST - 1 VIEW  COMPARISON:  07/09/2013  FINDINGS: A right IJ dialysis catheter remains in good position. A right upper extremity PICC is also stable, tip at the upper right atrial level.  Unchanged cardiomegaly.  Unchanged widening of the vascular pedicle.  When accounting for differences in penetration, there is likely no change in diffuse interstitial and airspace disease. Lung volumes remain generally low. No pneumothorax.  IMPRESSION: 1. No change in diffuse interstitial and airspace disease. 2. Central venous access is in stable position.   Electronically Signed   By: Jorje Guild M.D.   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 CXR stable bilateral opacities  Plan: - Continue solumedrol 60 Q12 ($Remov'1mg'NqaUVw$ /kg), not much improvement noted, reduce slowly - S/p 2 doses Rituxan, consider 3rd dose, pending response to plsma xchange - Plasma exchange w/ albumin replacement started 6/30, 7 tx over 2 wk, 1.0 PV removed - Ct atovaquone 6/21 for PCP prophylaxis (sulfa allergy) - F/u CXR intermittent - HFNC, wean as tolerated - if not responding to plasma xchange I would favor open lung bx, re assess afer 3rd treatment (7/4) - Continue negative fluid balance, lasix made 5 liters neg and she tolerated well, but O2 improving slowly -consider cytoxan  -CXR 7/4  RENAL: Protein and Hb in U/A on admission. Plan: - Monitor renal f/x - lasix maintain -k, mag  supp  GI: Hx of GERD Hx constipation Plan:  - Carb-modified diet while on high dose steroids - Protonix  - Miralax daily - PRN Reglan and Zofran  HEME: Anemia associated with alveolar hemorrhage - mild iron deficiency  Leukocytosis >> likely from steroids. No overt blood loss reported Plan:      - ordered serum and urine protein electrophoresis, serum and urine immunofixation and quantification Immunoglobulins - done 07/07/13 - Ct Atovaquone for PCP prophylaxis - F/u CBC, high risk pancytopenia - Transfuse for Hgb < 7 - SCD for DVT prevention  ENDO: Steroid-induced hyperglycemia  Plan:  - SSI - carb mod diet    Psych: anxiety Plan:  - Continue Bupropion, Celexa - PRN Xanax - ativan 1 mg bid, may need to increase  Global- No therapy  change 7/3. Effectiveness of plasmapheresis to be reconsidered after 3rd Rx this weekend.  CXR for 7/4      CD Annamaria Boots, MD  M: [phone]   Pgr:[phone] [facility]

## 2013-07-12 ENCOUNTER — Inpatient Hospital Stay (HOSPITAL_COMMUNITY): Payer: 59

## 2013-07-12 LAB — POCT I-STAT, CHEM 8
BUN: 22 mg/dL (ref 6–23)
BUN: 17 mg/dL (ref 6–23)
CHLORIDE: 95 meq/L — AB (ref 96–112)
CREATININE: 0.8 mg/dL (ref 0.50–1.10)
Calcium, Ion: 1.16 mmol/L (ref 1.12–1.23)
Calcium, Ion: 1.16 mmol/L (ref 1.12–1.23)
Chloride: 88 mEq/L — ABNORMAL LOW (ref 96–112)
Creatinine, Ser: 0.7 mg/dL (ref 0.50–1.10)
Glucose, Bld: 164 mg/dL — ABNORMAL HIGH (ref 70–99)
Glucose, Bld: 295 mg/dL — ABNORMAL HIGH (ref 70–99)
HCT: 28 % — ABNORMAL LOW (ref 36.0–46.0)
HEMATOCRIT: 26 % — AB (ref 36.0–46.0)
Hemoglobin: 9.5 g/dL — ABNORMAL LOW (ref 12.0–15.0)
Hemoglobin: 8.8 g/dL — ABNORMAL LOW (ref 12.0–15.0)
POTASSIUM: 3.7 meq/L (ref 3.7–5.3)
POTASSIUM: 3.7 meq/L (ref 3.7–5.3)
SODIUM: 138 meq/L (ref 137–147)
Sodium: 138 mEq/L (ref 137–147)
TCO2: 33 mmol/L (ref 0–100)
TCO2: 31 mmol/L (ref 0–100)

## 2013-07-12 LAB — GLUCOSE, CAPILLARY
GLUCOSE-CAPILLARY: 228 mg/dL — AB (ref 70–99)
GLUCOSE-CAPILLARY: 186 mg/dL — AB (ref 70–99)
GLUCOSE-CAPILLARY: 164 mg/dL — AB (ref 70–99)
Glucose-Capillary: 126 mg/dL — ABNORMAL HIGH (ref 70–99)

## 2013-07-12 MED ORDER — HEPARIN SODIUM (PORCINE) 1000 UNIT/ML IJ SOLN
1000.0000 [IU] | Freq: Once | INTRAMUSCULAR | Status: DC
  Filled 2013-07-12: qty 1

## 2013-07-12 MED ORDER — ACETAMINOPHEN 325 MG PO TABS: 650.0000 mg | ORAL_TABLET | ORAL | Status: DC | PRN

## 2013-07-12 MED ORDER — ANTICOAGULANT SODIUM CITRATE 4% (200MG/5ML) IV SOLN
5.0000 mL | Freq: Once | Status: DC
  Filled 2013-07-12: qty 250

## 2013-07-12 MED ORDER — DIPHENHYDRAMINE HCL 25 MG PO CAPS: 25.0000 mg | ORAL_CAPSULE | Freq: Four times a day (QID) | ORAL | Status: DC | PRN

## 2013-07-12 MED ORDER — SODIUM CHLORIDE 0.9 % IV SOLN
4.0000 g | Freq: Once | INTRAVENOUS | Status: DC
  Filled 2013-07-12: qty 40

## 2013-07-12 MED ORDER — SODIUM CHLORIDE 0.9 % IV SOLN
4.0000 g | Freq: Once | INTRAVENOUS | Status: AC
  Administered 2013-07-12: 4 g via INTRAVENOUS
  Filled 2013-07-12: qty 40

## 2013-07-12 MED ORDER — ACD FORMULA A 0.73-2.45-2.2 GM/100ML VI SOLN
500.0000 mL | Status: DC
  Filled 2013-07-12: qty 500

## 2013-07-12 MED ORDER — CALCIUM CARBONATE ANTACID 500 MG PO CHEW
2.0000 | CHEWABLE_TABLET | ORAL | Status: DC
  Filled 2013-07-12 (×2): qty 2

## 2013-07-12 MED ORDER — CALCIUM CARBONATE ANTACID 500 MG PO CHEW
2.0000 | CHEWABLE_TABLET | ORAL | Status: AC
  Administered 2013-07-12 (×2): 400 mg via ORAL
  Filled 2013-07-12 (×2): qty 2

## 2013-07-12 MED ORDER — ACD FORMULA A 0.73-2.45-2.2 GM/100ML VI SOLN
Status: AC
  Administered 2013-07-12: 500 mL via INTRAVENOUS
  Filled 2013-07-12: qty 500

## 2013-07-12 MED ORDER — SODIUM CHLORIDE 0.9 % IV SOLN
INTRAVENOUS | Status: DC
  Filled 2013-07-12 (×4): qty 200

## 2013-07-12 MED ORDER — ALBUMIN HUMAN 25 % IV SOLN
Freq: Once | INTRAVENOUS | Status: DC
  Filled 2013-07-12: qty 200

## 2013-07-12 MED ORDER — SODIUM CHLORIDE 0.9 % IV SOLN
INTRAVENOUS | Status: AC
  Administered 2013-07-12 (×4): via INTRAVENOUS_CENTRAL
  Filled 2013-07-12 (×4): qty 200

## 2013-07-12 MED ORDER — ACD FORMULA A 0.73-2.45-2.2 GM/100ML VI SOLN
500.0000 mL | Status: DC
  Administered 2013-07-16: 500 mL via INTRAVENOUS
  Filled 2013-07-12 (×2): qty 500

## 2013-07-12 NOTE — Progress Notes (Signed)
PULMONARY / CRITICAL CARE MEDICINE   Name: [name] MRN: [id number] DOB: [birth year]    ADMISSION DATE:  06/23/2013 CONSULTATION DATE:  06/23/2013  REFERRING MD : Doyle Askew   CHIEF COMPLAINT: Shortness of breath, hemoptysis, diffuse alveolar hemorrhage   BRIEF PATIENT DESCRIPTION:  53 yo female former smoker presented with cough, hemoptysis for 2 days.  Found to have hypoxia and diffuse pulmonary infiltrates.  Serology showed P ANCA 1:640, Myeloperoxidase > 8 .  SIGNIFICANT EVENTS: 6/15 Admit, PCCM consulted  6/16 Start high dose steroids, Transfuse PRBC 6/17 Start weekly Rituxan  6/18 Hematology consulted to assess for plasma exchange >> not indicated at this time 6/21 Transition to prednisone 6/23 second dose of  weekly rituxan  6/29 Patient continues to need NRB 6/30 Started plasma exchange, weaned to HFNC 7/2- neg 5 liters, plasma xch #2, same O2 needs 30 liters  STUDIES: 6/15 CT chest >> diffuse b/l GGO 6/15 P ANCA 1:640, Myeloperoxidase > 8, HIV non reactive, Aldolase 4, CPK 31, Scl 70 < 1, anti GBM < 1, CCP < 2, RF < 10, ds DNA < 1, ANA negative, ESR 90, CRP 22, SSA < 1, SSB < 1 6/15 U/A >> 100 protein, large Hgb, moderate leukocytes 6/16 Echo >> EF 55 to 60% 6/16 Lupus anticoagulant dectected 6/17 Hep B IgM negative, Hep B core Ab negative, Hep B surface Ag negative, Hep B surface Ab negative, Hep C Ab negative 6/17 G6PD >> 22.8 6/22 desatn off NRB  LINES / TUBES:  Rt PICC 6/17 >>> HD cath 6/29>>>  CULTURES:  Respiratory viral panel 6/15 >> negative Legionella 6/15 >> negative Blood 6/15 >> neg  ANTIBIOTICS:  Levaquin 6/15 >>> 6/17 Ceftriaxone 6/15 >>> one dose 6/15  Azithromycin 6/15 >>> one dose 6/15 Atovaquone 6/21 >>>   SUBJECTIVE:  Husband here Tolerating HFNC with improved O2 ,sat stable in high 90s Remains anxious 3rd plas exchange today  VITAL SIGNS: Temp:  [97.5 F (36.4 C)-98 F (36.7 C)] 97.5 F (36.4 C) (07/04 0801) Pulse Rate:   [89-115] 94 (07/04 0801) Resp:  [18-26] 21 (07/04 0801) BP: (90-146)/(36-80) 130/64 mmHg (07/04 0801) SpO2:  [94 %-100 %] 97 % (07/04 0801) FiO2 (%):  [50 %-70 %] 60 % (07/04 0749) Weight:  [143.1 kg (315 lb 7.7 oz)] 143.1 kg (315 lb 7.7 oz) (07/04 0551) HFNC 86% 30L  INTAKE / OUTPUT: Intake/Output     07/03 0701 - 07/04 0700 07/04 0701 - 07/05 0700   P.O. 510    I.V. (mL/kg) 150 (1)    Other 1625    IV Piggyback     Total Intake(mL/kg) 2285 (16)    Urine (mL/kg/hr) 1550 (0.5)    Stool 2 (0)    Total Output 1552     Net +733          Urine Occurrence  1 x   Stool Occurrence  1 x    PHYSICAL EXAMINATION: General: Asleep, woke easily, Speaks in full sentences, on HFNC,   Neuro: Awake, alert, oriented. Answering questions appropriately, no speech abnormalities. Some generalized weakness. HEENT: PERRL, no scleral icterus. Mucosa clear Cardiovascular: RRR, no m/g/r. PICC line and HD cath in place. Lungs: Slightly tachypneic, shallow. Lungs clear bilaterally Abdomen: Soft, non-distended. BS + Musculoskeletal: No peripheral edema  LABS:  CBC  Recent Labs Lab 07/09/13 0500 07/10/13 0800 07/10/13 0802 07/11/13 0530  WBC 25.7* 17.4*  --  19.8*  HGB 7.5* 7.4* 9.5* 8.1*  HCT 25.3* 25.0* 28.0* 27.4*  PLT 247 192  --  210   Coag's No results found for this basename: APTT, INR,  in the last 168 hours BMET  Recent Labs Lab 07/09/13 0500 07/10/13 0500 07/10/13 0802 07/11/13 0530  NA 142 141 138 142  K 4.0 3.8 3.7 4.2  CL 101 95* 88* 96  CO2 29 33*  --  34*  BUN _0 25*  CREATININE 0.74 0.72 0.80 0.76  GLUCOSE 144* 176* 164* 132*   Electrolytes  Recent Labs Lab 07/09/13 0500 07/10/13 0500 07/11/13 0530  CALCIUM 8.6 8.5 8.9  MG  --  1.9 2.3  PHOS  --  3.5 4.2    Sepsis Markers No results found for this basename: LATICACIDVEN, PROCALCITON, O2SATVEN,  in the last 168 hours ABG No results found for this basename: PHART, PCO2ART, PO2ART,  in the last 168  hours Liver Enzymes  Recent Labs Lab 07/08/13 0611  AST 14  ALT 35  ALKPHOS 108  BILITOT 0.9  ALBUMIN 2.8*   Cardiac Enzymes No results found for this basename: TROPONINI, PROBNP,  in the last 168 hours Glucose  Recent Labs Lab 07/10/13 2213 07/11/13 0823 07/11/13 1249 07/11/13 1659 07/11/13 2149 07/12/13 0800  GLUCAP 221* 104* 121* 158* 250* 126*    Imaging Dg Chest Port 1 View  07/12/2013   CLINICAL DATA:  Cough and shortness of breath. Alveolar hemorrhage. Smoker.  EXAM: PORTABLE CHEST - 1 VIEW  COMPARISON:  07/10/2013.  FINDINGS: Stable right jugular catheter and right PICC. Stable enlarged cardiac silhouette and diffusely prominent interstitial markings. Unremarkable bones.  IMPRESSION: Stable cardiomegaly and marked diffuse interstitial lung disease.   Electronically Signed   By: Enrique Sack M.D.   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 CXR stable bilateral opacities  Plan: - Continue solumedrol 60 Q12 (67m/kg), not much improvement noted,   - S/p 2 doses Rituxan, consider 3rd dose, pending response to plsma xchange - Plasma exchange w/ albumin replacement started 6/30, 7 tx over 2 wk, 1.0 PV removed  - Ct atovaquone 6/21 for PCP prophylaxis (sulfa allergy) - F/u CXR intermittent - HFNC, wean as tolerated - if not responding to plasma xchange I would favor open lung bx, re assess afer 3rd treatment (7/4) - Continue negative fluid balance, lasix made 5 liters neg and she tolerated well, but O2 improving slowly -consider cytoxan  -CXR 7/5  RENAL: Protein and Hb in U/A on admission. Plan: - Monitor renal f/x - lasix maintain -k, mag supp  GI: Hx of GERD Hx constipation Plan:  - Carb-modified diet while on high dose steroids - Protonix  - Miralax daily - PRN Reglan and Zofran  HEME: Anemia associated with  alveolar hemorrhage - mild iron deficiency  Leukocytosis >> likely from steroids. No overt blood loss reported Plan:      - ordered serum and urine protein electrophoresis, serum and urine immunofixation and quantification Immunoglobulins - done 07/07/13 - Ct Atovaquone for PCP prophylaxis - F/u CBC, high risk pancytopenia - Transfuse for Hgb < 7 - SCD for DVT prevention  ENDO: Steroid-induced hyperglycemia  Plan:  - SSI - carb mod diet    Psych: anxiety Plan:  - Continue Bupropion, Celexa - PRN Xanax - ativan 1 mg bid, may need to increase  Global- No therapy change 7/4. Effectiveness of plasmapheresis to be reconsidered after 3rd Rx this weekend.  CXR for 7/5      Mariel Sleet Beeper  [phone]  Cell  [phone]  If no response or cell goes to voicemail, call beeper [phone]  07/12/2013 10:02 AM

## 2013-07-12 NOTE — Progress Notes (Addendum)
  [facility] Progress Note   Subjective: no new complaints  Filed Vitals:   07/12/13 0300 07/12/13 0551 07/12/13 0749 07/12/13 0801  BP: 90/36 132/77  130/64  Pulse: 105 94  94  Temp:  97.9 F (36.6 C)  97.5 F (36.4 C)  TempSrc:  Oral  Oral  Resp: 18 25  21   Height:      Weight:  143.1 kg (315 lb 7.7 oz)    SpO2: 99% 98% 100% 97%   Exam: Sleeping, on high-flow [place] O2, no distress No JVD  Chest rales at bases only now RRR no MRG  Abd obese, soft, NTND  No LE edema Neuro is nf, Ox3  UA- neg protein, 3-6 wbc, 7-10 rb Creat 0.7- 0.8 CXR diffuse bilat infiltrates look improved compared to 6/26   Assessment:  1 Resp failure / hemoptysis / pulm infiltrates- 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  2 Obesity  3 Anxiety / depression  Plan- pheresis today then Monday    Kelly Splinter MD  pager [phone]    cell [phone]  07/12/2013, 11:34 AM     Recent Labs Lab 07/09/13 0500 07/10/13 0500 07/10/13 0802 07/11/13 0530  NA 142 141 138 142  K 4.0 3.8 3.7 4.2  CL 101 95* 88* 96  CO2 29 33*  --  34*  GLUCOSE 144* 176* 164* 132*  BUN 23 23 22  25*  CREATININE 0.74 0.72 0.80 0.76  CALCIUM 8.6 8.5  --  8.9  PHOS  --  3.5  --  4.2    Recent Labs Lab 07/08/13 0611  AST 14  ALT 35  ALKPHOS 108  BILITOT 0.9  PROT 6.1  ALBUMIN 2.8*    Recent Labs Lab 07/09/13 0500 07/10/13 0800 07/10/13 0802 07/11/13 0530  WBC 25.7* 17.4*  --  19.8*  NEUTROABS 23.4*  --   --   --   HGB 7.5* 7.4* 9.5* 8.1*  HCT 25.3* 25.0* 28.0* 27.4*  MCV 85.2 85.0  --  85.6  PLT 247 192  --  210   . therapeutic plasma exchange solution   Dialysis Q1 Hr x 4  . anticoagulant sodium citrate  5 mL Intracatheter Once  . antiseptic oral rinse  15 mL Mouth Rinse BID  . atovaquone  1,500 mg Oral Q breakfast   . buPROPion  300 mg Oral Daily  . calcium gluconate IVPB  4 g Intravenous Once  . citalopram  40 mg Oral Daily  . folic acid  5 mg Oral Daily  . insulin aspart  0-20 Units Subcutaneous TID WC  . LORazepam  1 mg Oral BID  . methylPREDNISolone (SOLU-MEDROL) injection  50 mg Intravenous Q12H  . pantoprazole  40 mg Oral QHS  . sodium chloride  10-40 mL Intracatheter Q12H   . sodium chloride 10 mL/hr at 07/09/13 0700   ALPRAZolam, fentaNYL, heparin, hydrALAZINE, HYDROcodone-acetaminophen, metoCLOPramide (REGLAN) injection, ondansetron (ZOFRAN) IV, polyethylene glycol, sodium chloride

## 2013-07-13 DIAGNOSIS — Z9181 History of falling: Secondary | ICD-10-CM

## 2013-07-13 LAB — GLUCOSE, CAPILLARY
GLUCOSE-CAPILLARY: 284 mg/dL — AB (ref 70–99)
GLUCOSE-CAPILLARY: 136 mg/dL — AB (ref 70–99)
Glucose-Capillary: 278 mg/dL — ABNORMAL HIGH (ref 70–99)
Glucose-Capillary: 233 mg/dL — ABNORMAL HIGH (ref 70–99)

## 2013-07-13 MED ORDER — LORAZEPAM 0.5 MG PO TABS: 2.0000 mg | ORAL_TABLET | Freq: Two times a day (BID) | ORAL | Status: DC

## 2013-07-13 MED ORDER — METHYLPREDNISOLONE SODIUM SUCC 40 MG IJ SOLR
40.0000 mg | Freq: Two times a day (BID) | INTRAMUSCULAR | Status: DC
  Administered 2013-07-13 – 2013-07-16 (×6): 40 mg via INTRAVENOUS
  Filled 2013-07-13 (×8): qty 1

## 2013-07-13 MED ORDER — QUETIAPINE FUMARATE 100 MG PO TABS
100.0000 mg | ORAL_TABLET | Freq: Every day | ORAL | Status: DC
  Administered 2013-07-13 – 2013-07-21 (×7): 100 mg via ORAL
  Filled 2013-07-13 (×10): qty 1

## 2013-07-13 MED ORDER — ALPRAZOLAM 0.5 MG PO TABS
1.0000 mg | ORAL_TABLET | Freq: Three times a day (TID) | ORAL | Status: DC
  Administered 2013-07-13 – 2013-07-22 (×26): 1 mg via ORAL
  Filled 2013-07-13 (×26): qty 2

## 2013-07-13 NOTE — Procedures (Signed)
I was present at the pheresis session, have reviewed the session itself and made  appropriate changes  Kelly Splinter MD (pgr) [phone]    ([phone] 07/12/2013, 1:38 PM

## 2013-07-13 NOTE — Progress Notes (Signed)
  [facility] Progress Note   Subjective: no new complaints  Filed Vitals:   07/13/13 0747 07/13/13 1132 07/13/13 1210 07/13/13 1513  BP: 148/78 132/71    Pulse: 99 108 107 104  Temp: 97.6 F (36.4 C) 97.7 F (36.5 C)    TempSrc: Oral Oral    Resp: 18 19 19 22   Height:      Weight:      SpO2: 100% 93% 95% 96%   Exam: Sleeping, on high-flow [place] O2, no distress No JVD  Chest rales at bases only now RRR no MRG  Abd obese, soft, NTND  No LE edema Neuro is nf, Ox3  UA- neg protein, 3-6 wbc, 7-10 rb Creat 0.7- 0.8 CXR diffuse bilat infiltrates look improved compared to 6/26   Assessment:  1 Resp failure / hemoptysis / pulm infiltrates- 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 So far has had 3 sessions on 6/30, 7/2 and 7/4.    2 Obesity  3 Anxiety / depression  Plan- pheresis Monday    Kelly Splinter MD  pager [phone]    cell [phone]  07/13/2013, 4:01 PM     Recent Labs Lab 07/09/13 0500 07/10/13 0500 07/10/13 0802 07/11/13 0530 07/12/13 1323  NA 142 141 138 142 138  K 4.0 3.8 3.7 4.2 3.7  CL 101 95* 88* 96 95*  CO2 29 33*  --  34*  --   GLUCOSE 144* 176* 164* 132* 295*  BUN 23 23 22  25* 17  CREATININE 0.74 0.72 0.80 0.76 0.70  CALCIUM 8.6 8.5  --  8.9  --   PHOS  --  3.5  --  4.2  --     Recent Labs Lab 07/08/13 0611  AST 14  ALT 35  ALKPHOS 108  BILITOT 0.9  PROT 6.1  ALBUMIN 2.8*    Recent Labs Lab 07/09/13 0500 07/10/13 0800 07/10/13 0802 07/11/13 0530 07/12/13 1323  WBC 25.7* 17.4*  --  19.8*  --   NEUTROABS 23.4*  --   --   --   --   HGB 7.5* 7.4* 9.5* 8.1* 8.8*  HCT 25.3* 25.0* 28.0* 27.4* 26.0*  MCV 85.2 85.0  --  85.6  --   PLT 247 192  --  210  --    . ALPRAZolam  1 mg Oral TID  . anticoagulant sodium citrate  5 mL Intracatheter Once  . antiseptic oral rinse  15 mL Mouth Rinse BID  . atovaquone  1,500 mg Oral Q breakfast  . buPROPion  300 mg Oral Daily  .  citalopram  40 mg Oral Daily  . folic acid  5 mg Oral Daily  . insulin aspart  0-20 Units Subcutaneous TID WC  . methylPREDNISolone (SOLU-MEDROL) injection  40 mg Intravenous Q12H  . pantoprazole  40 mg Oral QHS  . QUEtiapine  100 mg Oral QHS  . sodium chloride  10-40 mL Intracatheter Q12H   . sodium chloride 10 mL/hr at 07/09/13 0700  . citrate dextrose     acetaminophen, diphenhydrAMINE, fentaNYL, heparin, hydrALAZINE, HYDROcodone-acetaminophen, metoCLOPramide (REGLAN) injection, ondansetron (ZOFRAN) IV, polyethylene glycol, sodium chloride

## 2013-07-13 NOTE — Progress Notes (Addendum)
PULMONARY / CRITICAL CARE MEDICINE   Name: [name] MRN: [id number] DOB: 1960-03-25    ADMISSION DATE:  06/23/2013 CONSULTATION DATE:  06/23/2013  REFERRING MD : Doyle Askew   CHIEF COMPLAINT: Shortness of breath, hemoptysis, diffuse alveolar hemorrhage   BRIEF PATIENT DESCRIPTION:  53 yo female former smoker presented with cough, hemoptysis for 2 days.  Found to have hypoxia and diffuse pulmonary infiltrates.  Serology showed P ANCA 1:640, Myeloperoxidase > 8 .  SIGNIFICANT EVENTS: 6/15 Admit, PCCM consulted  6/16 Start high dose steroids, Transfuse PRBC 6/17 Start weekly Rituxan  6/18 Hematology consulted to assess for plasma exchange >> not indicated at this time 6/21 Transition to prednisone 6/23 second dose of  weekly rituxan  6/29 Patient continues to need NRB 6/30 Started plasma exchange, weaned to HFNC 7/2- neg 5 liters, plasma xch #2, same O2 needs 30 liters  STUDIES: 6/15 CT chest >> diffuse b/l GGO 6/15 P ANCA 1:640, Myeloperoxidase > 8, HIV non reactive, Aldolase 4, CPK 31, Scl 70 < 1, anti GBM < 1, CCP < 2, RF < 10, ds DNA < 1, ANA negative, ESR 90, CRP 22, SSA < 1, SSB < 1 6/15 U/A >> 100 protein, large Hgb, moderate leukocytes 6/16 Echo >> EF 55 to 60% 6/16 Lupus anticoagulant dectected 6/17 Hep B IgM negative, Hep B core Ab negative, Hep B surface Ag negative, Hep B surface Ab negative, Hep C Ab negative 6/17 G6PD >> 22.8 6/22 desatn off NRB  LINES / TUBES:  Rt PICC 6/17 >>> HD cath 6/29>>>  CULTURES:  Respiratory viral panel 6/15 >> negative Legionella 6/15 >> negative Blood 6/15 >> neg  ANTIBIOTICS:  Levaquin 6/15 >>> 6/17 Ceftriaxone 6/15 >>> one dose 6/15  Azithromycin 6/15 >>> one dose 6/15 Atovaquone 6/21 >>>   SUBJECTIVE:  Pt found kneeling on side of bed with staff in room.  Pt had been on bedside commode and then slid onto bottom.  Pt lifted back in bed with hoyer.  Pt very anxious this am.  sats were doing well on 5L  VITAL SIGNS: Temp:   [97.5 F (36.4 C)-98.6 F (37 C)] 97.6 F (36.4 C) (07/05 0747) Pulse Rate:  [92-113] 99 (07/05 0747) Resp:  [17-31] 18 (07/05 0747) BP: (91-166)/(56-103) 148/78 mmHg (07/05 0747) SpO2:  [88 %-100 %] 100 % (07/05 0747) Weight:  [142.3 kg (313 lb 11.4 oz)] 142.3 kg (313 lb 11.4 oz) (07/05 0423) sats 95% on 5L  INTAKE / OUTPUT: Intake/Output     07/04 0701 - 07/05 0700 07/05 0701 - 07/06 0700   P.O. 830    I.V. (mL/kg) 190 (1.3)    Other     IV Piggyback 290    Total Intake(mL/kg) 1310 (9.2)    Urine (mL/kg/hr) 3950 (1.2) 125 (0.3)   Stool     Total Output 3950 125   Net -2640 -125        Urine Occurrence 1 x    Stool Occurrence 1 x     PHYSICAL EXAMINATION: General: very agitated on [place] kneeling on side of bed, hoyer placed pt into the bed   Neuro: Awake, alert, oriented. Answering questions appropriately, no speech abnormalities. Some generalized weakness. HEENT: PERRL, no scleral icterus. Mucosa clear Cardiovascular: RRR, no m/g/r. PICC line and HD cath in place. Lungs:very tachypneic Abdomen: Soft, non-distended. BS + Musculoskeletal: No peripheral edema  LABS:  CBC  Recent Labs Lab 07/09/13 0500 07/10/13 0800 07/10/13 0802 07/11/13 0530 07/12/13 1323  WBC 25.7*  17.4*  --  19.8*  --   HGB 7.5* 7.4* 9.5* 8.1* 8.8*  HCT 25.3* 25.0* 28.0* 27.4* 26.0*  PLT 247 192  --  210  --    Coag's No results found for this basename: APTT, INR,  in the last 168 hours BMET  Recent Labs Lab 07/09/13 0500 07/10/13 0500 07/10/13 0802 07/11/13 0530 07/12/13 1323  NA 142 141 138 142 138  K 4.0 3.8 3.7 4.2 3.7  CL 101 95* 88* 96 95*  CO2 29 33*  --  34*  --   BUN '23 23 22 ' 25* 17  CREATININE 0.74 0.72 0.80 0.76 0.70  GLUCOSE 144* 176* 164* 132* 295*   Electrolytes  Recent Labs Lab 07/09/13 0500 07/10/13 0500 07/11/13 0530  CALCIUM 8.6 8.5 8.9  MG  --  1.9 2.3  PHOS  --  3.5 4.2    Sepsis Markers No results found for this basename: LATICACIDVEN,  PROCALCITON, O2SATVEN,  in the last 168 hours ABG No results found for this basename: PHART, PCO2ART, PO2ART,  in the last 168 hours Liver Enzymes  Recent Labs Lab 07/08/13 0611  AST 14  ALT 35  ALKPHOS 108  BILITOT 0.9  ALBUMIN 2.8*   Cardiac Enzymes No results found for this basename: TROPONINI, PROBNP,  in the last 168 hours Glucose  Recent Labs Lab 07/11/13 2149 07/12/13 0800 07/12/13 1212 07/12/13 1543 07/12/13 2147 07/13/13 0910  GLUCAP 250* 126* 228* 186* 164* 278*    Imaging Dg Chest Port 1 View  07/12/2013   CLINICAL DATA:  Cough and shortness of breath. Alveolar hemorrhage. Smoker.  EXAM: PORTABLE CHEST - 1 VIEW  COMPARISON:  07/10/2013.  FINDINGS: Stable right jugular catheter and right PICC. Stable enlarged cardiac silhouette and diffusely prominent interstitial markings. Unremarkable bones.  IMPRESSION: Stable cardiomegaly and marked diffuse interstitial lung disease.   Electronically Signed   By: Enrique Sack M.D.   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   Plan: - Continue solumedrol 60 Q12 (61m/kg), not much improvement noted,   - S/p 2 doses Rituxan, consider 3rd dose, pending response to plsma xchange - Plasma exchange w/ albumin replacement started 6/30, 7 tx over 2 wk, 1.0 PV removed   - Ct atovaquone 6/21 for PCP prophylaxis (sulfa allergy) - F/u CXR intermittent - wean oxygen as able No lasix 7/5 Note pt improving so doubt will need OLBx -taper steroids with agitation   RENAL: Protein and Hb in U/A on admission. Plan: - Monitor renal f/x No lasix 7/5   GI: Hx of GERD Hx constipation Plan:  - Carb-modified diet while on high dose steroids - Protonix  - Miralax daily - PRN Reglan and Zofran  HEME: Anemia associated with alveolar hemorrhage - mild iron deficiency  Leukocytosis >> likely from  steroids. No overt blood loss reported Plan:      - ordered serum and urine protein electrophoresis, serum and urine immunofixation and quantification Immunoglobulins - done 07/07/13 - Ct Atovaquone for PCP prophylaxis - F/u CBC, high risk pancytopenia - Transfuse for Hgb < 7 - SCD for DVT prevention  ENDO: Steroid-induced hyperglycemia  Plan:  - SSI - carb mod diet    Psych: anxiety Plan:  - Continue Bupropion, Celexa -Xanax on schedule -add seroquel [id number] - d/c ativan, may be making pt worse   Global- doubt needs OLBx.  Taper steroids d/t anxiety issues. Consider another rituxan dose this week     Mariel Sleet Beeper  [phone]  Cell  [phone]  If no response or cell goes to voicemail, call beeper [phone]  07/13/2013 9:58 AM

## 2013-07-13 NOTE — Progress Notes (Signed)
Patient up at bedside with [name], NT, during linen change. Pt suddenly stated that her "knees were going to give" and was lowered to floor by [name], NT, and self. Writer was at computer during this time and then assisted [name], [name], and patient. Reassessment completed by writer, which was found to be unchanged, and vital signs obtained. No injuries noted and patient stated feeling "just fine".   Dr Joya Gaskins at bedside and aware of event. Physical therapy evaluation ordered and medication regimine adjusted. Spouse, [name], at bedside and aware of event as well. Patient educated on high fall risk and interventions taken to prevent future falls; patient verbalized understanding. Writer notified Sam, RN, charge nurse of event. Please see flowsheets for additional details. 1:21 PM 07/13/2013 0950

## 2013-07-13 NOTE — Progress Notes (Signed)
Patient extremely anxious moving around in bed oxygen saturations dropped to 84% on 5 lnc. Patient requesting to go back on  non rebreather mask. Marland KitchenEcouraged patient to take slow deep breaths.breath in through nose and out through mouth.Patient still begging to go on non rebreather  mask.Placed  Patient back on non rebreather for approximately 10 minutes to get her to slow her breathing down .And then placed back on oxygen at 5 lnc. Oxygen saturations maintaing 93-95%. Will continue to monitor.

## 2013-07-14 LAB — POCT I-STAT, CHEM 8
BUN: 23 mg/dL (ref 6–23)
Calcium, Ion: 1.22 mmol/L (ref 1.12–1.23)
Chloride: 97 mEq/L (ref 96–112)
Creatinine, Ser: 0.7 mg/dL (ref 0.50–1.10)
Glucose, Bld: 180 mg/dL — ABNORMAL HIGH (ref 70–99)
HCT: 26 % — ABNORMAL LOW (ref 36.0–46.0)
Hemoglobin: 8.8 g/dL — ABNORMAL LOW (ref 12.0–15.0)
Potassium: 3.6 mEq/L — ABNORMAL LOW (ref 3.7–5.3)
SODIUM: 140 meq/L (ref 137–147)
TCO2: 28 mmol/L (ref 0–100)

## 2013-07-14 LAB — GLUCOSE, CAPILLARY
GLUCOSE-CAPILLARY: 158 mg/dL — AB (ref 70–99)
Glucose-Capillary: 153 mg/dL — ABNORMAL HIGH (ref 70–99)
Glucose-Capillary: 155 mg/dL — ABNORMAL HIGH (ref 70–99)
Glucose-Capillary: 179 mg/dL — ABNORMAL HIGH (ref 70–99)
Glucose-Capillary: 289 mg/dL — ABNORMAL HIGH (ref 70–99)

## 2013-07-14 MED ORDER — ACETAMINOPHEN 325 MG PO TABS: 650.0000 mg | ORAL_TABLET | ORAL | Status: DC | PRN

## 2013-07-14 MED ORDER — SODIUM CHLORIDE 0.9 % IV SOLN
4.0000 g | Freq: Once | INTRAVENOUS | Status: AC
  Administered 2013-07-14: 4 g via INTRAVENOUS
  Filled 2013-07-14 (×2): qty 40

## 2013-07-14 MED ORDER — CALCIUM CARBONATE ANTACID 500 MG PO CHEW
CHEWABLE_TABLET | ORAL | Status: AC
  Administered 2013-07-14: 400 mg
  Filled 2013-07-14: qty 4

## 2013-07-14 MED ORDER — ACD FORMULA A 0.73-2.45-2.2 GM/100ML VI SOLN
Status: AC
  Filled 2013-07-14: qty 500

## 2013-07-14 MED ORDER — SODIUM CHLORIDE 0.9 % IV SOLN
INTRAVENOUS | Status: AC
  Administered 2013-07-14 (×3): via INTRAVENOUS_CENTRAL
  Filled 2013-07-14 (×4): qty 200

## 2013-07-14 MED ORDER — FENTANYL CITRATE 0.05 MG/ML IJ SOLN
25.0000 ug | INTRAMUSCULAR | Status: DC | PRN
  Administered 2013-07-14 – 2013-07-19 (×15): 25 ug via INTRAVENOUS
  Administered 2013-07-20: 09:00:00 via INTRAVENOUS
  Administered 2013-07-20 – 2013-07-21 (×3): 25 ug via INTRAVENOUS
  Filled 2013-07-14 (×16): qty 2

## 2013-07-14 MED ORDER — ACD FORMULA A 0.73-2.45-2.2 GM/100ML VI SOLN: 500.0000 mL | Status: DC

## 2013-07-14 MED ORDER — CALCIUM CARBONATE ANTACID 500 MG PO CHEW
400.0000 mg | CHEWABLE_TABLET | Freq: Once | ORAL | Status: AC
  Administered 2013-07-14: 400 mg via ORAL

## 2013-07-14 MED ORDER — DIPHENHYDRAMINE HCL 25 MG PO CAPS: 25.0000 mg | ORAL_CAPSULE | Freq: Four times a day (QID) | ORAL | Status: DC | PRN

## 2013-07-14 MED ORDER — ANTICOAGULANT SODIUM CITRATE 4% (200MG/5ML) IV SOLN
5.0000 mL | Freq: Once | Status: DC
  Filled 2013-07-14: qty 250

## 2013-07-14 MED ORDER — FUROSEMIDE 10 MG/ML IJ SOLN
20.0000 mg | Freq: Two times a day (BID) | INTRAMUSCULAR | Status: DC
  Administered 2013-07-15 – 2013-07-16 (×3): 20 mg via INTRAVENOUS
  Filled 2013-07-14 (×3): qty 2

## 2013-07-14 NOTE — Progress Notes (Signed)
Physical Therapy Treatment Patient Details Name: [name] MRN: [id number] DOB: 1960-07-09 Today's Date: 07/14/2013    History of Present Illness 53 yo female admited 06/23/13 withShortness of breath, hemoptysis, diffuse alveolar hemorrhage . Pt has  been in respiratory distress. Pt highly anxious.      PT Comments    Pt admitted with above. Pt currently with functional limitations due to balance and endurance deficits.  Pt will benefit from skilled PT to increase their independence and safety with mobility to allow discharge to the venue listed below.   Follow Up Recommendations  SNF;Supervision/Assistance - 24 hour     Equipment Recommendations  None recommended by PT    Recommendations for Other Services OT consult     Precautions / Restrictions Precautions Precautions: Fall Precaution Comments: chemo, monitor sats, plasmaphoresis, poor safety due to anxiety Restrictions Weight Bearing Restrictions: No    Mobility  Bed Mobility Overal bed mobility: Needs Assistance;+ 2 for safety/equipment Bed Mobility: Supine to Sit;Sit to Supine     Supine to sit: Min assist;+2 for physical assistance;HOB elevated Sit to supine: Mod assist;+2 for physical assistance   General bed mobility comments: Pt needs min to min guard assist throughout for safety.  high anxiety affects safety.  Transfers Overall transfer level: Needs assistance Equipment used: Ambulation equipment used [name] plus) Transfers: Sit to/from Stand Sit to Stand: Min assist;Mod assist;+2 safety/equipment;From elevated surface         General transfer comment: Took 3 attempts at [employer] and pt was not standing.  4th attempt PT used pad to assist with power up.  Pt finally stood fully upright.  She then was able to stand for 10 min in [place].  Pt rested a few min and then stood 2 more times for 2 min and then 5 min.  Nursing came in to see how well pt did with [name] Plus and to wash her bottom while standing  on last stand attempt.  Pt is self limiting.  Refused to get to chair but did at least work on standing today.    Ambulation/Gait                 Stairs            Wheelchair Mobility    Modified Rankin (Stroke Patients Only)       Balance Overall balance assessment: Needs assistance;History of Falls Sitting-balance support: Bilateral upper extremity supported;Feet supported Sitting balance-Leahy Scale: Fair     Standing balance support: Bilateral upper extremity supported;During functional activity Standing balance-Leahy Scale: Poor Standing balance comment: Requires Sara Plus and bil UE support to stand as well as knee support in [place] Plus.                      Cognition Arousal/Alertness: Awake/alert Behavior During Therapy: Anxious;Restless Overall Cognitive Status: Impaired/Different from baseline Area of Impairment: Safety/judgement;Problem solving;Following commands       Following Commands: Follows one step commands with increased time;Follows one step commands inconsistently Safety/Judgement: Decreased awareness of safety;Decreased awareness of deficits   Problem Solving: Requires verbal cues;Difficulty sequencing General Comments: Pt anxiety limits mobility.      Exercises General Exercises - Lower Extremity Ankle Circles/Pumps: AROM;Both;10 reps;Supine Long Arc Quad: AROM;Both;10 reps;Seated    General Comments        Pertinent Vitals/Pain VSS, Sats 86-95% on 4LO2 with activity.  No pain.     Home Living  Prior Function            PT Goals (current goals can now be found in the care plan section) Progress towards PT goals: Progressing toward goals    Frequency  Min 3X/week    PT Plan Current plan remains appropriate    Co-evaluation             End of Session Equipment Utilized During Treatment: Gait belt;Oxygen Activity Tolerance: Patient limited by fatigue (limited by  anxiety) Patient left: in bed;with call bell/phone within reach;with bed alarm set     Time: [id number] PT Time Calculation (min): 40 min  Charges:  $Therapeutic Exercise: 8-22 mins $Therapeutic Activity: 23-37 mins                    G Codes:      INGOLD,[name] 07-24-13, 9:54 AM [facility] [phone] (pager)

## 2013-07-14 NOTE — Progress Notes (Signed)
PULMONARY / CRITICAL CARE MEDICINE   Name: [name] MRN: [id number] DOB: 03/13/1960    ADMISSION DATE:  06/23/2013 CONSULTATION DATE:  06/23/2013  REFERRING MD : Myers   CHIEF COMPLAINT: Shortness of breath, hemoptysis, diffuse alveolar hemorrhage   BRIEF PATIENT DESCRIPTION:  53-year-old female former smoker presented with cough, hemoptysis for 2 days.  Found to have hypoxia and diffuse pulmonary infiltrates.  Serology showed P ANCA 1:640, Myeloperoxidase > 8 .  SIGNIFICANT EVENTS: 6/15 Admit, PCCM consulted  6/16 Start high dose steroids, Transfuse PRBC 6/17 Start weekly Rituxan  6/18 Hematology consulted to assess for plasma exchange >> not indicated at this time 6/21 Transition to prednisone 6/23 Second dose of  weekly rituxan  6/29 Patient continues to need NRB 6/30 Started plasma exchange, weaned to HFNC 7/02 Neg 5 liters, plasma xch #2, same O2 needs 30 liters 7/04 Plasma exchange tx #3 7/06 Plasma exchange tx #4, pt on 5L [place] for 2 days, sats stable  STUDIES: 6/15 CT chest >> diffuse b/l GGO 6/15 P ANCA 1:640, Myeloperoxidase > 8, HIV non reactive, Aldolase 4, CPK 31, Scl 70 < 1, anti GBM < 1, CCP < 2, RF < 10, ds DNA < 1, ANA negative, ESR 90, CRP 22, SSA < 1, SSB < 1 6/15 U/A >> 100 protein, large Hgb, moderate leukocytes 6/16 Echo >> EF 55 to 60% 6/16 Lupus anticoagulant dectected 6/17 Hep B IgM negative, Hep B core Ab negative, Hep B surface Ag negative, Hep B surface Ab negative, Hep C Ab negative 6/17 G6PD >> 22.8  LINES / TUBES:  Rt PICC 6/17 >>> HD cath 6/29 >>>  CULTURES:  Respiratory viral panel 6/15 >> negative Legionella 6/15 >> negative Blood 6/15 >> neg  ANTIBIOTICS:  Levaquin 6/15 >>> 6/17 Ceftriaxone 6/15 >>> one dose 6/15  Azithromycin 6/15 >>> one dose 6/15 Atovaquone 6/21 >>>   SUBJECTIVE:  Per RN, patient had another episode of kneeling on the ground because she "couldn't catch her breath." Demanded NRB mask though sats were in 90s  per RN. Currently on 5L [place], satting well. Expresses frustration with not being given NRB when she requests it.  Explained to patient that the goal here is to reduce her O2 needs.   VITAL SIGNS: Temp:  [96.9 F (36.1 C)-99 F (37.2 C)] 97 F (36.1 C) (07/06 1305) Pulse Rate:  [69-117] 96 (07/06 1248) Resp:  [12-24] 24 (07/06 1305) BP: (103-138)/(50-97) 112/60 mmHg (07/06 1248) SpO2:  [93 %-100 %] 96 % (07/06 1305) Weight:  [143.1 kg (315 lb 7.7 oz)-143.11 kg (315 lb 8 oz)] 143.1 kg (315 lb 7.7 oz) (07/06 1115) sats 95% on 5L  INTAKE / OUTPUT: Intake/Output     07/05 0701 - 07/06 0700 07/06 0701 - 07/07 0700   P.O. 760    I.V. (mL/kg)  20 (0.1)   IV Piggyback     Total Intake(mL/kg) 760 (5.3) 20 (0.1)   Urine (mL/kg/hr) 3850 (1.1) 1175 (1.3)   Total Output 3850 1175   Net -3090 -1155        Stool Occurrence 2 x     PHYSICAL EXAMINATION: General: Obese white female, anxious affect, NAD. Neuro: Awake, alert, oriented. Currently eating. Answering questions appropriately, no speech abnormalities. Some generalized weakness. Very anxious. HEENT: PERRL, no scleral icterus. Oral mucosa pink and moist. [place] in place. Cardiovascular: RRR, no m/g/r. PICC line and HD cath in place. Lungs: Resps even and slightly labored.  Breath sounds slightly diminished. No w/r/r. Abdomen:   Soft, non-distended. BS + Musculoskeletal: No peripheral edema noted.  LABS:  CBC  Recent Labs Lab 07/09/13 0500 07/10/13 0800 07/10/13 0802 07/11/13 0530 07/12/13 1323  WBC 25.7* 17.4*  --  19.8*  --   HGB 7.5* 7.4* 9.5* 8.1* 8.8*  HCT 25.3* 25.0* 28.0* 27.4* 26.0*  PLT 247 192  --  210  --    Coag's No results found for this basename: APTT, INR,  in the last 168 hours BMET  Recent Labs Lab 07/09/13 0500 07/10/13 0500 07/10/13 0802 07/11/13 0530 07/12/13 1323  NA 142 141 138 142 138  K 4.0 3.8 3.7 4.2 3.7  CL 101 95* 88* 96 95*  CO2 29 33*  --  34*  --   BUN 23 23 22 25* 17  CREATININE 0.74  0.72 0.80 0.76 0.70  GLUCOSE 144* 176* 164* 132* 295*   Electrolytes  Recent Labs Lab 07/09/13 0500 07/10/13 0500 07/11/13 0530  CALCIUM 8.6 8.5 8.9  MG  --  1.9 2.3  PHOS  --  3.5 4.2    Sepsis Markers No results found for this basename: LATICACIDVEN, PROCALCITON, O2SATVEN,  in the last 168 hours ABG No results found for this basename: PHART, PCO2ART, PO2ART,  in the last 168 hours Liver Enzymes  Recent Labs Lab 07/08/13 0611  AST 14  ALT 35  ALKPHOS 108  BILITOT 0.9  ALBUMIN 2.8*   Cardiac Enzymes No results found for this basename: TROPONINI, PROBNP,  in the last 168 hours Glucose  Recent Labs Lab 07/13/13 1136 07/13/13 1625 07/13/13 2138 07/14/13 0437 07/14/13 0734 07/14/13 1158  GLUCAP 284* 136* 233* 153* 158* 155*    Imaging No results found.  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 Major improvement with plasma-xchange and lasix Plan: - Wean O2 as able - Continue solumedrol 40 Q12 (1mg/kg), consider continued taper - Received 4th plasma exchange tx 7/6 - Plasma exchange w/ albumin replacement started 6/30, 7 tx over 2 wk, 1.0 PV removed   - Ct atovaquone 6/21 for PCP prophylaxis (sulfa allergy) - F/u CXR intermittent for am  -re add lasix, see renal  RENAL:  Protein and Hb in U/A on admission. Plan: - Monitor renal f/x -lasix 20 q12h -chem in am with mag, phos  GI:  Hx of GERD Hx constipation Plan:  - Carb-modified diet while on high dose steroids, consider taper roids over next 4 days to 1 mg./kg - Protonix  - Miralax daily - PRN Reglan and Zofran  HEME:  Anemia associated with alveolar hemorrhage - mild iron deficiency  Leukocytosis >> likely from steroids. No overt blood loss reported Plan:  - Ct Atovaquone for PCP prophylaxis - F/u CBC, high risk pancytopenia - Transfuse for Hgb < 7 - SCD for DVT  prevention -limit phlebotomy  ENDO:  Steroid-induced hyperglycemia  Plan:  - SSI - Carb mod diet    PSYCH/NEURO: Anxiety Plan:  - Continue Bupropion, Celexa - Xanax on schedule - Seroquel 100mg QHS added - Pt complaining of pain, uncertain if this is solely anxiety related, fent increase  Stephanie M. Reese, PA-S 07/14/2013 1:27 PM   I have fully examined this patient and agree with above findings.      Daniel J. Feinstein, MD, FACP Pgr: [phone] [facility]  

## 2013-07-14 NOTE — Progress Notes (Signed)
Patient ID: [name], female   DOB: September 09, 1960, 53 y.o.   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 Started on plasmapheresis. So far has had 3 sessions on 6/30, 7/2 and 7/4 and planned for her 4th out of 7 pheresis treatment today. Also on corticosteroids. 2. Hypertension: Marginally elevated blood pressures, continue to monitor 3. Obesity: Reports of a ?sore on her back---high risk for pressure sores 4. Anxiety / depression: Restarted back on her SSRIs  Subjective:   Reports issues overnight with not getting oxygen via NRB-mask when she requested for it. Overall, remains in the anxious disposition    Objective:   BP 131/80  Pulse 83  Temp(Src) 97.9 F (36.6 C) (Oral)  Resp 12  Ht 5\' 9"  (1.753 m)  Wt 143.11 kg (315 lb 8 oz)  BMI 46.57 kg/m2  SpO2 100%  Intake/Output Summary (Last 24 hours) at 07/14/13 0804 Last data filed at 07/14/13 0754  Gross per 24 hour  Intake    760 ml  Output   3850 ml  Net  -3090 ml   Weight change: 0.81 kg (1 lb 12.6 oz)  Physical Exam: Gen: Appears to be comfortable resting in bed, nurse by bedside CVS: Pulse regular in rate and rhythm, S1 and S2 normal Resp: Coarse breath sounds bilaterally-no rales/rhonchi Abd: Soft, nontender, bowel sounds normal Ext: Trace lower extremity edema  Imaging: Dg Chest Port 1 View  07/12/2013   CLINICAL DATA:  Cough and shortness of breath. Alveolar hemorrhage. Smoker.  EXAM: PORTABLE CHEST - 1 VIEW  COMPARISON:  07/10/2013.  FINDINGS: Stable right jugular catheter and right PICC. Stable enlarged cardiac silhouette and diffusely prominent interstitial markings. Unremarkable bones.  IMPRESSION: Stable cardiomegaly and marked diffuse interstitial lung disease.   Electronically Signed   By: Enrique Sack M.D.    On: 07/12/2013 09:22    Labs: BMET  Recent Labs Lab 07/08/13 0611 07/09/13 0500 07/10/13 0500 07/10/13 0802 07/11/13 0530 07/12/13 1323  NA 140 142 141 138 142 138  K 4.4 4.0 3.8 3.7 4.2 3.7  CL 97 101 95* 88* 96 95*  CO2 32 29 33*  --  34*  --   GLUCOSE 116* 144* 176* 164* 132* 295*  BUN 22 23 23 22  25* 17  CREATININE 0.64 0.74 0.72 0.80 0.76 0.70  CALCIUM 8.8 8.6 8.5  --  8.9  --   PHOS  --   --  3.5  --  4.2  --    CBC  Recent Labs Lab 07/08/13 0611 07/09/13 0500 07/10/13 0800 07/10/13 0802 07/11/13 0530 07/12/13 1323  WBC 20.3* 25.7* 17.4*  --  19.8*  --   NEUTROABS  --  23.4*  --   --   --   --   HGB 7.8* 7.5* 7.4* 9.5* 8.1* 8.8*  HCT 26.4* 25.3* 25.0* 28.0* 27.4* 26.0*  MCV 85.7 85.2 85.0  --  85.6  --   PLT 295 247 192  --  210  --     Medications:    . ALPRAZolam  1 mg Oral TID  . anticoagulant sodium citrate  5 mL Intracatheter Once  . antiseptic oral rinse  15 mL Mouth Rinse BID  . atovaquone  1,500 mg Oral Q breakfast  . buPROPion  300 mg Oral Daily  . citalopram  40 mg Oral Daily  .  folic acid  5 mg Oral Daily  . insulin aspart  0-20 Units Subcutaneous TID WC  . methylPREDNISolone (SOLU-MEDROL) injection  40 mg Intravenous Q12H  . pantoprazole  40 mg Oral QHS  . QUEtiapine  100 mg Oral QHS  . sodium chloride  10-40 mL Intracatheter Q12H   Elmarie Shiley, MD 07/14/2013, 8:04 AM

## 2013-07-15 ENCOUNTER — Inpatient Hospital Stay (HOSPITAL_COMMUNITY): Payer: 59

## 2013-07-15 DIAGNOSIS — J811 Chronic pulmonary edema: Secondary | ICD-10-CM

## 2013-07-15 DIAGNOSIS — J9600 Acute respiratory failure, unspecified whether with hypoxia or hypercapnia: Secondary | ICD-10-CM

## 2013-07-15 LAB — BASIC METABOLIC PANEL
Anion gap: 14 (ref 5–15)
BUN: 23 mg/dL (ref 6–23)
CO2: 27 meq/L (ref 19–32)
Calcium: 8.5 mg/dL (ref 8.4–10.5)
Chloride: 101 mEq/L (ref 96–112)
Creatinine, Ser: 0.67 mg/dL (ref 0.50–1.10)
GFR calc Af Amer: >90 mL/min (ref >90)
GFR calc non Af Amer: >90 mL/min (ref >90)
Glucose, Bld: 186 mg/dL — ABNORMAL HIGH (ref 70–99)
Potassium: 3.8 mEq/L (ref 3.7–5.3)
SODIUM: 142 meq/L (ref 137–147)

## 2013-07-15 LAB — GLUCOSE, CAPILLARY
GLUCOSE-CAPILLARY: 154 mg/dL — AB (ref 70–99)
Glucose-Capillary: 200 mg/dL — ABNORMAL HIGH (ref 70–99)
Glucose-Capillary: 199 mg/dL — ABNORMAL HIGH (ref 70–99)

## 2013-07-15 LAB — MAGNESIUM: MAGNESIUM: 2 mg/dL (ref 1.5–2.5)

## 2013-07-15 LAB — PHOSPHORUS: PHOSPHORUS: 3.4 mg/dL (ref 2.3–4.6)

## 2013-07-15 NOTE — Progress Notes (Signed)
Patient ID: [name], female   DOB: Nov 30, 1960, 53 y.o.   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 Started on plasmapheresis/ corticosteroids. So far has had 4 sessions and planned for her 5th out of 7 pheresis treatment tomorrow. Recommend rechecking ANCA/MPO titers on Thursday-after fifth treatment.  2. Hypertension: Marginally elevated blood pressures, continue to monitor  3. Obesity: Reports of a ?sore on her back---high risk for pressure sores  4. Anxiety / depression: Restarted back on her SSRIs  Subjective:   Reports to be feeling better-happier today with regards to nursing care    Objective:   BP 112/80  Pulse 92  Temp(Src) 97.8 F (36.6 C) (Oral)  Resp 23  Ht 5\' 9"  (1.753 m)  Wt 143 kg (315 lb 4.1 oz)  BMI 46.53 kg/m2  SpO2 100%  Intake/Output Summary (Last 24 hours) at 07/15/13 0810 Last data filed at 07/15/13 0750  Gross per 24 hour  Intake    977 ml  Output   5850 ml  Net  -4873 ml   Weight change: -0.01 kg (-0.4 oz)  Physical Exam: Gen: Comfortably resting in bed CVS: Pulse regular in rate and rhythm, S1 and S2 normal Resp: Coarse breath sounds bilaterally-no rales. Intermittent expiratory wheeze Abd: Soft, obese, nontender and bowel sounds are normal Ext: Trace to 1+ lower extremity edema  Imaging: Dg Chest Port 1 View  07/15/2013   CLINICAL DATA:  Pulmonary edema  EXAM: PORTABLE CHEST - 1 VIEW  COMPARISON:  Portable chest x-ray of July 12, 2013  FINDINGS: The lungs are borderline hypoinflated. The interstitial markings remain increased but have slightly improved. The cardiopericardial silhouette remains enlarged. The pulmonary vascularity is more distinct today. The right internal jugular venous catheter tip lies in the proximal SVC. The PICC line tip lies in the mid to distal  SVC.  IMPRESSION: There has been slight interval improvement in the pulmonary interstitium consistent with resolving interstitial edema.   Electronically Signed   By: David  Martinique   On: 07/15/2013 07:53    Labs: BMET  Recent Labs Lab 07/09/13 0500 07/10/13 0500 07/10/13 0802 07/11/13 0530 07/12/13 1323 07/14/13 1129 07/15/13 0330  NA 142 141 138 142 138 140 142  K 4.0 3.8 3.7 4.2 3.7 3.6* 3.8  CL 101 95* 88* 96 95* 97 101  CO2 29 33*  --  34*  --   --  27  GLUCOSE 144* 176* 164* 132* 295* 180* 186*  BUN 23 23 22  25* 17 23 23   CREATININE 0.74 0.72 0.80 0.76 0.70 0.70 0.67  CALCIUM 8.6 8.5  --  8.9  --   --  8.5  PHOS  --  3.5  --  4.2  --   --  3.4   CBC  Recent Labs Lab 07/09/13 0500 07/10/13 0800 07/10/13 0802 07/11/13 0530 07/12/13 1323 07/14/13 1129  WBC 25.7* 17.4*  --  19.8*  --   --   NEUTROABS 23.4*  --   --   --   --   --   HGB 7.5* 7.4* 9.5* 8.1* 8.8* 8.8*  HCT 25.3* 25.0* 28.0* 27.4* 26.0* 26.0*  MCV 85.2 85.0  --  85.6  --   --   PLT 247 192  --  210  --   --     Medications:    . ALPRAZolam  1 mg Oral TID  . anticoagulant sodium citrate  5 mL Intracatheter Once  . anticoagulant sodium citrate  5 mL Intracatheter Once  . antiseptic oral rinse  15 mL Mouth Rinse BID  . atovaquone  1,500 mg Oral Q breakfast  . buPROPion  300 mg Oral Daily  . citalopram  40 mg Oral Daily  . folic acid  5 mg Oral Daily  . furosemide  20 mg Intravenous Q12H  . insulin aspart  0-20 Units Subcutaneous TID WC  . methylPREDNISolone (SOLU-MEDROL) injection  40 mg Intravenous Q12H  . pantoprazole  40 mg Oral QHS  . QUEtiapine  100 mg Oral QHS  . sodium chloride  10-40 mL Intracatheter Q12H   Elmarie Shiley, MD 07/15/2013, 8:10 AM

## 2013-07-15 NOTE — Progress Notes (Signed)
PULMONARY / CRITICAL CARE MEDICINE   Name: [name] MRN: [id number] DOB: Nov 15, 1960    ADMISSION DATE:  06/23/2013 CONSULTATION DATE:  06/23/2013  REFERRING MD: Doyle Askew   CHIEF COMPLAINT: Shortness of breath, hemoptysis, diffuse alveolar hemorrhage   BRIEF PATIENT DESCRIPTION:  53 year old female former smoker presented with cough, hemoptysis for 2 days.  Found to have hypoxia and diffuse pulmonary infiltrates.  Serology showed P ANCA 1:640, Myeloperoxidase > 8 .  SIGNIFICANT EVENTS: 6/15 Admit, PCCM consulted  6/16 Start high dose steroids, Transfuse PRBC 6/17 Start weekly Rituxan  6/18 Hematology consulted to assess for plasma exchange >> not indicated at this time 6/21 Transition to prednisone 6/23 Second dose of  weekly rituxan  6/29 Patient continues to need NRB 6/30 Started plasma exchange, weaned to [facility] 7/02 Neg 5 liters, plasma xch #2, same O2 needs 30 liters 7/04 Plasma exchange tx #3 7/06 Plasma exchange tx #4, pt on 5L [place] for 2 days, sats stable 7/7- sob resolving even with exertion  STUDIES: 6/15 CT chest >> diffuse b/l GGO 6/15 P ANCA 1:640, Myeloperoxidase > 8, HIV non reactive, Aldolase 4, CPK 31, Scl 70 < 1, anti GBM < 1, CCP < 2, RF < 10, ds DNA < 1, ANA negative, ESR 90, CRP 22, SSA < 1, SSB < 1 6/15 U/A >> 100 protein, large Hgb, moderate leukocytes 6/16 Echo >> EF 55 to 60% 6/16 Lupus anticoagulant dectected 6/17 Hep B IgM negative, Hep B core Ab negative, Hep B surface Ag negative, Hep B surface Ab negative, Hep C Ab negative 6/17 G6PD >> 22.8  LINES / TUBES:  Rt PICC 6/17 >>> HD cath 6/29 >>>  CULTURES:  Respiratory viral panel 6/15 >> negative Legionella 6/15 >> negative Blood 6/15 >> neg  ANTIBIOTICS:  Levaquin 6/15 >>> 6/17 Ceftriaxone 6/15 >>> one dose 6/15  Azithromycin 6/15 >>> one dose 6/15 Atovaquone 6/21 >>>   SUBJECTIVE: Patient states she is feeling better today, she is able to sit up in bed without assistance. Up to a chair  today with PT, did well.  Continues to improve with diuresis and plasma exchange. 5th plasma treatment scheduled for 7/8 AM.  VITAL SIGNS: Temp:  [97.8 F (36.6 C)-98.1 F (36.7 C)] 98.1 F (36.7 C) (07/07 1141) Pulse Rate:  [92-120] 104 (07/07 1141) Resp:  [12-28] 25 (07/07 1141) BP: (104-161)/(41-102) 114/41 mmHg (07/07 1141) SpO2:  [94 %-100 %] 96 % (07/07 1141) Weight:  [143 kg (315 lb 4.1 oz)] 143 kg (315 lb 4.1 oz) (07/07 0437) sats 95% on 5L  INTAKE / OUTPUT: Intake/Output     07/06 0701 - 07/07 0700 07/07 0701 - 07/08 0700   P.O. 957 360   I.V. (mL/kg) 20 (0.1)    Total Intake(mL/kg) 977 (6.8) 360 (2.5)   Urine (mL/kg/hr) 3875 (1.1) 3400 (3.8)   Stool  1 (0)   Total Output 3875 3401   Net -2898 -3041        Stool Occurrence 2 x     PHYSICAL EXAMINATION: General: Obese white female, NAD. Neuro: Awake, alert, oriented x 4. Answering questions appropriately, no speech abnormalities. Some generalized weakness. Less anxious. Up to chair with PT today, strength improving. HEENT: PERRL, no scleral icterus. Oral mucosa pink and moist. [place] in place. Cardiovascular: RRR, no m/g/r. PICC line and HD cath in place. Lungs: Resps even and slightly labored.  Breath sounds slightly diminished. No w/r/r. Abdomen: Soft, non-distended. BS + Musculoskeletal: No peripheral edema noted.  LABS:  CBC  Recent Labs Lab 07/09/13 0500 07/10/13 0800  07/11/13 0530 07/12/13 1323 07/14/13 1129  WBC 25.7* 17.4*  --  19.8*  --   --   HGB 7.5* 7.4*  < > 8.1* 8.8* 8.8*  HCT 25.3* 25.0*  < > 27.4* 26.0* 26.0*  PLT 247 192  --  210  --   --   < > = values in this interval not displayed. Coag's No results found for this basename: APTT, INR,  in the last 168 hours BMET  Recent Labs Lab 07/10/13 0500  07/11/13 0530 07/12/13 1323 07/14/13 1129 07/15/13 0330  NA 141  < > 142 138 140 142  K 3.8  < > 4.2 3.7 3.6* 3.8  CL 95*  < > 96 95* 97 101  CO2 33*  --  34*  --   --  27  BUN 23  < >  25* _0 CREATININE 0.72  < > 0.76 0.70 0.70 0.67  GLUCOSE 176*  < > 132* 295* 180* 186*  < > = values in this interval not displayed. Electrolytes  Recent Labs Lab 07/10/13 0500 07/11/13 0530 07/15/13 0330  CALCIUM 8.5 8.9 8.5  MG 1.9 2.3 2.0  PHOS 3.5 4.2 3.4    Sepsis Markers No results found for this basename: LATICACIDVEN, PROCALCITON, O2SATVEN,  in the last 168 hours ABG No results found for this basename: PHART, PCO2ART, PO2ART,  in the last 168 hours Liver Enzymes No results found for this basename: AST, ALT, ALKPHOS, BILITOT, ALBUMIN,  in the last 168 hours Cardiac Enzymes No results found for this basename: TROPONINI, PROBNP,  in the last 168 hours Glucose  Recent Labs Lab 07/14/13 0734 07/14/13 1158 07/14/13 1552 07/14/13 2106 07/15/13 0747 07/15/13 1134  GLUCAP 158* 155* 179* 289* 154* 200*    Imaging Dg Chest Port 1 View  07/15/2013   CLINICAL DATA:  Pulmonary edema  EXAM: PORTABLE CHEST - 1 VIEW  COMPARISON:  Portable chest x-ray of July 12, 2013  FINDINGS: The lungs are borderline hypoinflated. The interstitial markings remain increased but have slightly improved. The cardiopericardial silhouette remains enlarged. The pulmonary vascularity is more distinct today. The right internal jugular venous catheter tip lies in the proximal SVC. The PICC line tip lies in the mid to distal SVC.  IMPRESSION: There has been slight interval improvement in the pulmonary interstitium consistent with resolving interstitial edema.   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 Major improvement with plasma-xchange and lasix Plan: - Wean O2 as able - Continue solumedrol 40 Q12 (17m/kg), consider  continued taper in am to 1 daily dose 1 mg/kg - Received 4th plasma exchange tx 7/6 - Plasma exchange w/ albumin replacement started 6/30, 7 tx over 2 wk, 1.0 PV removed   - Ct atovaquone 6/21 for PCP prophylaxis (sulfa allergy) - Improvement of pulm interstitium on CXR 7/7 - Continue Lasix, has MADE MAJOR improvements  RENAL:  Protein and Hb in U/A on admission. Plan: - Monitor renal f/x, BUN/Cr stable (23, 0.67) - Continue Lasix 20 Q12h - Mag, phos WNL  GI:  Hx of GERD Hx constipation Plan:  - Carb-modified diet while on high dose steroids, consider taper roids over next 4 days  to 1 mg/kg - Protonix  - Miralax daily - PRN Reglan and Zofran  HEME:  Anemia associated with alveolar hemorrhage - mild iron deficiency  Leukocytosis >> likely from steroids. No overt blood loss reported Plan:  - Continue Atovaquone for PCP prophylaxis - F/u CBC, high risk pancytopenia - Transfuse for Hgb < 7 - SCD for DVT prevention - Limit phlebotomy  ENDO:  Steroid-induced hyperglycemia  Plan:  - SSI - Carb mod diet    PSYCH/NEURO: Anxiety Plan:  - Continue Bupropion, Celexa - Xanax on schedule - Seroquel 138m QHS added - Pt complaining of pain, uncertain if this is solely anxiety related, fent increased  Pulmonary/interstitial edema continues to improve with diuresis/plasma exchange.  To floor, tele, much better  SLowella Dell Reese, PA-S 07/15/2013  I have fully examined this patient and agree with above findings.     DLavon Paganini FTitus Mould MD, FCherryvalePgr: 3Chain LakePulmonary & Critical Care  1:16 PM

## 2013-07-15 NOTE — Progress Notes (Signed)
Physical Therapy Treatment Patient Details Name: [name] [name] [name] MRN: [id number] DOB: 09-Feb-1960 Today's Date: 07/15/2013    History of Present Illness 53 yo female admited 06/23/13 withShortness of breath, hemoptysis, diffuse alveolar hemorrhage . Pt has  been in respiratory distress. Pt highly anxious.      PT Comments    Pt admitted with above. Pt currently with functional limitations due to balance and endurance deficits.  Pt will benefit from skilled PT to increase their independence and safety with mobility to allow discharge to the venue listed below.   Follow Up Recommendations  SNF;Supervision/Assistance - 24 hour     Equipment Recommendations  None recommended by PT    Recommendations for Other Services OT consult     Precautions / Restrictions Precautions Precautions: Fall Precaution Comments: chemo, monitor sats, plasmaphoresis, poor safety due to anxiety Restrictions Weight Bearing Restrictions: No    Mobility  Bed Mobility Overal bed mobility: Needs Assistance;+ 2 for safety/equipment Bed Mobility: Supine to Sit     Supine to sit: HOB elevated;Min guard     General bed mobility comments: Pt needs min to min guard assist throughout for safety.  high anxiety affects safety.  Transfers Overall transfer level: Needs assistance Equipment used: Ambulation equipment used [name] plus) Transfers: Sit to/from Stand Sit to Stand: Min assist;Mod assist;+2 safety/equipment;From elevated surface Stand pivot transfers: +2 safety/equipment (use of SaraPlus with pt standing in [place] for transfer)       General transfer comment: Took 5 attempts at [employer] and pt was not standing.  5th attempt PT used pad to assist with power up.  Pt finally stood fully upright.  She then was able to stand for 2 min in [place] to be moved to [employer].  Pt had BM and then was able to stand from 3N1 without buttock assist and stood to be cleaned by NT for 5 min.  Then [name] moved to in  front of chair and pt stood another 4 min for increasing standing tolerance.  Pt then lowered to recliner. Pt continues to be self limiting. Anxiety from husband being in room made pt unable to stand until she asked pt to leave room. Husband infromed of progress upon return to room.   Ambulation/Gait                 Stairs            Wheelchair Mobility    Modified Rankin (Stroke Patients Only)       Balance Overall balance assessment: Needs assistance;History of Falls Sitting-balance support: No upper extremity supported;Feet supported Sitting balance-Leahy Scale: Fair     Standing balance support: Bilateral upper extremity supported;During functional activity Standing balance-Leahy Scale: Poor Standing balance comment: Requires Sara plus and UE support for standing.                      Cognition Arousal/Alertness: Awake/alert Behavior During Therapy: Anxious;Restless Overall Cognitive Status: Impaired/Different from baseline Area of Impairment: Safety/judgement;Problem solving;Following commands       Following Commands: Follows one step commands with increased time;Follows one step commands inconsistently Safety/Judgement: Decreased awareness of safety;Decreased awareness of deficits   Problem Solving: Requires verbal cues;Difficulty sequencing General Comments: Pt anxiety limits mobility.      Exercises General Exercises - Lower Extremity Ankle Circles/Pumps: AROM;Both;10 reps;Supine Long Arc Quad: AROM;Both;10 reps;Seated    General Comments        Pertinent Vitals/Pain VSS on 6LO2.  No pain  Home Living                      Prior Function            PT Goals (current goals can now be found in the care plan section) Progress towards PT goals: Progressing toward goals    Frequency  Min 3X/week    PT Plan Current plan remains appropriate    Co-evaluation             End of Session Equipment Utilized During  Treatment: Gait belt;Oxygen (O2 at 6L) Activity Tolerance: Patient limited by fatigue (limited by anxiety) Patient left: in chair;with call bell/phone within reach;with family/visitor present     Time: 1046-1130 PT Time Calculation (min): 44 min  Charges:  $Therapeutic Activity: 23-37 mins $Self Care/Home Management: 8-22                    G Codes:      [name],[name] [name] [name] [name] 07-23-2013, 11:56 AM [facility] [phone] (pager)

## 2013-07-16 DIAGNOSIS — Z87891 Personal history of nicotine dependence: Secondary | ICD-10-CM

## 2013-07-16 LAB — POCT I-STAT, CHEM 8
BUN: 22 mg/dL (ref 6–23)
CALCIUM ION: 1.14 mmol/L (ref 1.12–1.23)
CHLORIDE: 88 meq/L — AB (ref 96–112)
Creatinine, Ser: 0.8 mg/dL (ref 0.50–1.10)
GLUCOSE: 215 mg/dL — AB (ref 70–99)
HEMATOCRIT: 31 % — AB (ref 36.0–46.0)
Hemoglobin: 10.5 g/dL — ABNORMAL LOW (ref 12.0–15.0)
Potassium: 3.8 mEq/L (ref 3.7–5.3)
Sodium: 138 mEq/L (ref 137–147)
TCO2: 32 mmol/L (ref 0–100)

## 2013-07-16 LAB — GLUCOSE, CAPILLARY
GLUCOSE-CAPILLARY: 154 mg/dL — AB (ref 70–99)
GLUCOSE-CAPILLARY: 161 mg/dL — AB (ref 70–99)
Glucose-Capillary: 165 mg/dL — ABNORMAL HIGH (ref 70–99)
Glucose-Capillary: 113 mg/dL — ABNORMAL HIGH (ref 70–99)

## 2013-07-16 MED ORDER — FUROSEMIDE 10 MG/ML IJ SOLN
20.0000 mg | Freq: Two times a day (BID) | INTRAMUSCULAR | Status: AC
  Administered 2013-07-16 (×2): 20 mg via INTRAVENOUS
  Filled 2013-07-16 (×2): qty 2

## 2013-07-16 MED ORDER — FUROSEMIDE 10 MG/ML IJ SOLN
INTRAMUSCULAR | Status: AC
  Administered 2013-07-16: 20 mg via INTRAVENOUS
  Filled 2013-07-16: qty 4

## 2013-07-16 MED ORDER — ACD FORMULA A 0.73-2.45-2.2 GM/100ML VI SOLN
500.0000 mL | Status: DC
  Filled 2013-07-16: qty 500

## 2013-07-16 MED ORDER — CALCIUM GLUCONATE 10 % IV SOLN
4.0000 g | Freq: Once | INTRAVENOUS | Status: AC
  Administered 2013-07-16: 4 g via INTRAVENOUS
  Filled 2013-07-16: qty 40

## 2013-07-16 MED ORDER — ACD FORMULA A 0.73-2.45-2.2 GM/100ML VI SOLN
Status: AC
  Administered 2013-07-16: 500 mL via INTRAVENOUS
  Filled 2013-07-16: qty 500

## 2013-07-16 MED ORDER — HYDROCODONE-ACETAMINOPHEN 5-325 MG PO TABS
ORAL_TABLET | ORAL | Status: AC
  Administered 2013-07-16: 1 via ORAL
  Filled 2013-07-16: qty 1

## 2013-07-16 MED ORDER — HEPARIN SODIUM (PORCINE) 1000 UNIT/ML IJ SOLN
1000.0000 [IU] | Freq: Once | INTRAMUSCULAR | Status: AC
  Administered 2013-07-16: 1000 [IU]

## 2013-07-16 MED ORDER — DIPHENHYDRAMINE HCL 25 MG PO CAPS: 25.0000 mg | ORAL_CAPSULE | Freq: Four times a day (QID) | ORAL | Status: DC | PRN

## 2013-07-16 MED ORDER — ACETAMINOPHEN 325 MG PO TABS: 650.0000 mg | ORAL_TABLET | ORAL | Status: DC | PRN

## 2013-07-16 MED ORDER — SODIUM CHLORIDE 0.9 % IV SOLN
INTRAVENOUS | Status: AC
  Administered 2013-07-16 (×4): via INTRAVENOUS_CENTRAL
  Filled 2013-07-16 (×4): qty 200

## 2013-07-16 MED ORDER — CALCIUM CARBONATE ANTACID 500 MG PO CHEW: 2.0000 | CHEWABLE_TABLET | ORAL | Status: AC

## 2013-07-16 MED ORDER — METHYLPREDNISOLONE SODIUM SUCC 40 MG IJ SOLR
40.0000 mg | Freq: Every day | INTRAMUSCULAR | Status: AC
  Administered 2013-07-17: 40 mg via INTRAVENOUS
  Filled 2013-07-16: qty 1

## 2013-07-16 NOTE — Progress Notes (Signed)
PULMONARY / CRITICAL CARE MEDICINE   Name: [name] MRN: [id number] DOB: 1960-02-26    ADMISSION DATE:  06/23/2013 CONSULTATION DATE:  06/23/2013  REFERRING MD: Doyle Askew   CHIEF COMPLAINT: Shortness of breath, hemoptysis, diffuse alveolar hemorrhage   BRIEF PATIENT DESCRIPTION:  53 year old female former smoker presented with cough, hemoptysis for 2 days.  Found to have hypoxia and diffuse pulmonary infiltrates.  Serology showed P ANCA 1:640, Myeloperoxidase > 8 .  SIGNIFICANT EVENTS: 6/15 Admit, PCCM consulted  6/16 Start high dose steroids, Transfuse PRBC 6/17 Start weekly Rituxan  6/18 Hematology consulted to assess for plasma exchange >> not indicated at this time 6/21 Transition to prednisone 6/23 Second dose of  weekly rituxan  6/29 Patient continues to need NRB 6/30 Started plasma exchange, weaned to [facility] 7/02 Neg 5 liters, plasma xch #2, same O2 needs 30 liters 7/04 Plasma exchange tx #3 7/06 Plasma exchange tx #4, pt on 5L [place] for 2 days, sats stable 7/7- sob resolving even with exertion 7/7 tx to floor 7/8 Plasma exchange #5/7 STUDIES: 6/15 CT chest >> diffuse b/l GGO 6/15 P ANCA 1:640, Myeloperoxidase > 8, HIV non reactive, Aldolase 4, CPK 31, Scl 70 < 1, anti GBM < 1, CCP < 2, RF < 10, ds DNA < 1, ANA negative, ESR 90, CRP 22, SSA < 1, SSB < 1 6/15 U/A >> 100 protein, large Hgb, moderate leukocytes 6/16 Echo >> EF 55 to 60% 6/16 Lupus anticoagulant dectected 6/17 Hep B IgM negative, Hep B core Ab negative, Hep B surface Ag negative, Hep B surface Ab negative, Hep C Ab negative 6/17 G6PD >> 22.8  LINES / TUBES:  Rt PICC 6/17 >>> HD cath 6/29 >>>  CULTURES:  Respiratory viral panel 6/15 >> negative Legionella 6/15 >> negative Blood 6/15 >> neg  ANTIBIOTICS:  Levaquin 6/15 >>> 6/17 Ceftriaxone 6/15 >>> one dose 6/15  Azithromycin 6/15 >>> one dose 6/15 Atovaquone 6/21 >>>   SUBJECTIVE:  In HD for exchange, asleep with very obvious periods of  apnea.  VITAL SIGNS: Temp:  [97.6 F (36.4 C)-98.9 F (37.2 C)] 98.3 F (36.8 C) (07/08 0928) Pulse Rate:  [93-114] 101 (07/08 0928) Resp:  [19-30] 20 (07/08 0915) BP: (102-159)/(41-97) 145/97 mmHg (07/08 0928) SpO2:  [96 %-99 %] 99 % (07/08 0928) Weight:  [312 lb 9.8 oz (141.8 kg)-313 lb (141.976 kg)] 312 lb 9.8 oz (141.8 kg) (07/08 0900) sats 95% on 5L  INTAKE / OUTPUT: Intake/Output     07/07 0701 - 07/08 0700 07/08 0701 - 07/09 0700   P.O. 1080 480   I.V. (mL/kg) 10 (0.1)    Total Intake(mL/kg) 1090 (7.7) 480 (3.4)   Urine (mL/kg/hr) 10000 (2.9)    Stool 1 (0)    Total Output 10001     Net -8911 +480         PHYSICAL EXAMINATION: General: Obese white female, NAD. Neuro: Sleeping but arousable HEENT: PERRL, no scleral icterus. Oral mucosa pink and moist. [place] in place. Cardiovascular: RRR, no m/g/r. PICC line and HD cath in place. Lungs: periods of apnea and loud snoring while asleep Abdomen: Soft, non-distended. BS +, Obese Musculoskeletal: No peripheral edema noted.  LABS:  CBC  Recent Labs Lab 07/10/13 0800  07/11/13 0530 07/12/13 1323 07/14/13 1129  WBC 17.4*  --  19.8*  --   --   HGB 7.4*  < > 8.1* 8.8* 8.8*  HCT 25.0*  < > 27.4* 26.0* 26.0*  PLT 192  --  210  --   --   < > = values in this interval not displayed. Coag's No results found for this basename: APTT, INR,  in the last 168 hours BMET  Recent Labs Lab 07/10/13 0500  07/11/13 0530 07/12/13 1323 07/14/13 1129 07/15/13 0330  NA 141  < > 142 138 140 142  K 3.8  < > 4.2 3.7 3.6* 3.8  CL 95*  < > 96 95* 97 101  CO2 33*  --  34*  --   --  27  BUN 23  < > 25* _0 CREATININE 0.72  < > 0.76 0.70 0.70 0.67  GLUCOSE 176*  < > 132* 295* 180* 186*  < > = values in this interval not displayed. Electrolytes  Recent Labs Lab 07/10/13 0500 07/11/13 0530 07/15/13 0330  CALCIUM 8.5 8.9 8.5  MG 1.9 2.3 2.0  PHOS 3.5 4.2 3.4    Sepsis Markers No results found for this basename:  LATICACIDVEN, PROCALCITON, O2SATVEN,  in the last 168 hours ABG No results found for this basename: PHART, PCO2ART, PO2ART,  in the last 168 hours Liver Enzymes No results found for this basename: AST, ALT, ALKPHOS, BILITOT, ALBUMIN,  in the last 168 hours Cardiac Enzymes No results found for this basename: TROPONINI, PROBNP,  in the last 168 hours Glucose  Recent Labs Lab 07/14/13 1552 07/14/13 2106 07/15/13 0747 07/15/13 1134 07/15/13 1604 07/16/13 0729  GLUCAP 179* 289* 154* 200* 199* 154*    Imaging Dg Chest Port 1 View  07/15/2013   CLINICAL DATA:  Pulmonary edema  EXAM: PORTABLE CHEST - 1 VIEW  COMPARISON:  Portable chest x-ray of July 12, 2013  FINDINGS: The lungs are borderline hypoinflated. The interstitial markings remain increased but have slightly improved. The cardiopericardial silhouette remains enlarged. The pulmonary vascularity is more distinct today. The right internal jugular venous catheter tip lies in the proximal SVC. The PICC line tip lies in the mid to distal SVC.  IMPRESSION: There has been slight interval improvement in the pulmonary interstitium consistent with resolving interstitial edema.   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 Major improvement with plasma-xchange and lasix Plan: - Wean O2 as able - Continue solumedrol 40 Q12 (12m/kg), continued taper to 467mdaily on 7/8 - Received 5th plasma exchange tx 7/8 - Plasma exchange w/ albumin replacement started 6/30, 7 tx over 2 wk, 1.0 PV removed   - Ct atovaquone 6/21 for PCP prophylaxis (sulfa allergy) - Improvement of pulm interstitium on CXR 7/8 - Continue Lasix, has made improvements with negative i/o  RENAL:  Lab Results   Component Value Date   CREATININE 0.67 07/15/2013   CREATININE 0.70 07/14/2013   CREATININE 0.70 07/12/2013   Bun 23->23 Protein and Hb in U/A on admission. Plan: - Monitor renal f/x, BUN/Cr stable  - Continue Lasix 20 Q12h - Mag, phos WNL  GI:  Hx of GERD Hx constipation Plan:  - Carb-modified diet while on high dose steroids, consider taper roids over next 4 days to 1 mg/kg - Protonix  - Miralax daily - PRN Reglan and Zofran  HEME:   Recent Labs  07/14/13 1129  HGB 8.8*    Anemia associated with alveolar hemorrhage -  mild iron deficiency  Leukocytosis >> likely from steroids. 17->19 No overt blood loss reported Plan:  - Continue Atovaquone for PCP prophylaxis - F/u CBC, high risk pancytopenia - Transfuse for Hgb < 7 - SCD for DVT prevention - Limit phlebotomy  ENDO:  CBG (last 3)   Recent Labs  07/15/13 1134 07/15/13 1604 07/16/13 0729  GLUCAP 200* 199* 154*     Steroid-induced hyperglycemia (better) Plan:  - SSI - Carb mod diet    PSYCH/NEURO: Anxiety Plan:  - Continue Bupropion, Celexa - Xanax on schedule - Seroquel 160m QHS added - Pt complaining of pain, uncertain if this is solely anxiety related, fent increased  Pulmonary/interstitial edema continues to improve with diuresis/plasma exchange.  SRichardson LandryMinor ACNP LMaryanna ShapePCCM Pager [phone] 3 pm If no answer page [phone]/08/2013, 9:42 AM  Attending:  I have seen and examined the patient with nurse practitioner/resident and agree with the note above.   Wean methylprednisolone PLEX through Monday Rituxan on Tuesday, home afterwards  BRoselie Awkward MD LFultonvillePCCM Pager: [phone]: [phone] no response, call [phone]

## 2013-07-16 NOTE — Procedures (Signed)
Patient seen on plasma pheresis for diffuse alveolar hemorrhage-microscopic polyangiitis. Today's treatment #5 of 7 Blood pressure 158/97, patient tolerated procedure without problems. Treatment adjusted as needed.  Elmarie Shiley MD [facility]. Office # [phone] Pager # [phone] 9:14 AM

## 2013-07-16 NOTE — Progress Notes (Signed)
Patient ID: [name], female   DOB: April 26, 1960, 53 y.o.   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 Improving well on plasmapheresis/ corticosteroids. Today she is undergoing her 5/7 plasmapheresis treatment. We'll check ANCA titers tomorrow-after fifth treatment.  2. Hypertension: Marginally elevated blood pressures, continue to monitor  3. Obesity: Reports of a ?sore on her back---high risk for pressure sores  4. Anxiety / depression: Restarted back on her SSRIs  Subjective:   Reports to have had a fair night with decreased shortness of breath-hemoptysis better    Objective:   BP 138/76  Pulse 99  Temp(Src) 98.8 F (37.1 C) (Oral)  Resp 19  Ht 5\' 9"  (1.753 m)  Wt 141.8 kg (312 lb 9.8 oz)  BMI 46.14 kg/m2  SpO2 98%  Intake/Output Summary (Last 24 hours) at 07/16/13 0912 Last data filed at 07/16/13 [id number]  Gross per 24 hour  Intake   1570 ml  Output   7901 ml  Net  -6331 ml   Weight change: -1.124 kg (-2 lb 7.7 oz)  Physical Exam: Gen: Comfortably resting on plasmapheresis CVS: Pulse regular tachycardia, S1 and S2 with ESM Resp: Fine rales left base otherwise clear to auscultation Abd: Soft, obese, nontender Ext: Trace to 1+ lower extremity edema  Imaging: Dg Chest Port 1 View  07/15/2013   CLINICAL DATA:  Pulmonary edema  EXAM: PORTABLE CHEST - 1 VIEW  COMPARISON:  Portable chest x-ray of July 12, 2013  FINDINGS: The lungs are borderline hypoinflated. The interstitial markings remain increased but have slightly improved. The cardiopericardial silhouette remains enlarged. The pulmonary vascularity is more distinct today. The right internal jugular venous catheter tip lies in the proximal SVC. The PICC line tip lies in the mid to distal SVC.  IMPRESSION: There has been slight interval improvement in  the pulmonary interstitium consistent with resolving interstitial edema.   Electronically Signed   By: David  Martinique   On: 07/15/2013 07:53    Labs: BMET  Recent Labs Lab 07/10/13 0500 07/10/13 0802 07/11/13 0530 07/12/13 1323 07/14/13 1129 07/15/13 0330  NA 141 138 142 138 140 142  K 3.8 3.7 4.2 3.7 3.6* 3.8  CL 95* 88* 96 95* 97 101  CO2 33*  --  34*  --   --  27  GLUCOSE 176* 164* 132* 295* 180* 186*  BUN 23 22 25* 17 23 23   CREATININE 0.72 0.80 0.76 0.70 0.70 0.67  CALCIUM 8.5  --  8.9  --   --  8.5  PHOS 3.5  --  4.2  --   --  3.4   CBC  Recent Labs Lab 07/10/13 0800 07/10/13 0802 07/11/13 0530 07/12/13 1323 07/14/13 1129  WBC 17.4*  --  19.8*  --   --   HGB 7.4* 9.5* 8.1* 8.8* 8.8*  HCT 25.0* 28.0* 27.4* 26.0* 26.0*  MCV 85.0  --  85.6  --   --   PLT 192  --  210  --   --     Medications:    . therapeutic plasma exchange solution   Dialysis Q1 Hr x 4  . ALPRAZolam  1 mg Oral TID  . anticoagulant sodium citrate  5 mL Intracatheter Once  . antiseptic oral rinse  15 mL Mouth Rinse BID  . atovaquone  1,500 mg Oral Q breakfast  . buPROPion  300 mg Oral Daily  . calcium gluconate IVPB  4 g Intravenous Once  . citalopram  40 mg Oral Daily  . citrate dextrose      . folic acid  5 mg Oral Daily  . furosemide  20 mg Intravenous Q12H  . HYDROcodone-acetaminophen      . insulin aspart  0-20 Units Subcutaneous TID WC  . methylPREDNISolone (SOLU-MEDROL) injection  40 mg Intravenous Q12H  . pantoprazole  40 mg Oral QHS  . QUEtiapine  100 mg Oral QHS  . sodium chloride  10-40 mL Intracatheter Q12H   Elmarie Shiley, MD 07/16/2013, 9:12 AM

## 2013-07-17 DIAGNOSIS — R05 Cough: Secondary | ICD-10-CM

## 2013-07-17 DIAGNOSIS — R059 Cough, unspecified: Secondary | ICD-10-CM

## 2013-07-17 LAB — BASIC METABOLIC PANEL
Anion gap: 16 — ABNORMAL HIGH (ref 5–15)
BUN: 28 mg/dL — AB (ref 6–23)
CALCIUM: 9.2 mg/dL (ref 8.4–10.5)
CO2: 31 meq/L (ref 19–32)
CREATININE: 0.8 mg/dL (ref 0.50–1.10)
Chloride: 93 mEq/L — ABNORMAL LOW (ref 96–112)
GFR calc Af Amer: >90 mL/min (ref >90)
GFR calc non Af Amer: 83 mL/min — ABNORMAL LOW (ref >90)
Glucose, Bld: 136 mg/dL — ABNORMAL HIGH (ref 70–99)
Potassium: 3.6 mEq/L — ABNORMAL LOW (ref 3.7–5.3)
Sodium: 140 mEq/L (ref 137–147)

## 2013-07-17 LAB — CBC
HEMATOCRIT: 30.4 % — AB (ref 36.0–46.0)
Hemoglobin: 9.1 g/dL — ABNORMAL LOW (ref 12.0–15.0)
MCH: 26.5 pg (ref 26.0–34.0)
MCHC: 29.9 g/dL — ABNORMAL LOW (ref 30.0–36.0)
MCV: 88.4 fL (ref 78.0–100.0)
Platelets: 154 10*3/uL (ref 150–400)
RBC: 3.44 MIL/uL — ABNORMAL LOW (ref 3.87–5.11)
RDW: 21.1 % — ABNORMAL HIGH (ref 11.5–15.5)
WBC: 11 10*3/uL — AB (ref 4.0–10.5)

## 2013-07-17 LAB — GLUCOSE, CAPILLARY
GLUCOSE-CAPILLARY: 115 mg/dL — AB (ref 70–99)
GLUCOSE-CAPILLARY: 210 mg/dL — AB (ref 70–99)
Glucose-Capillary: 184 mg/dL — ABNORMAL HIGH (ref 70–99)
Glucose-Capillary: 213 mg/dL — ABNORMAL HIGH (ref 70–99)

## 2013-07-17 MED ORDER — FUROSEMIDE 40 MG PO TABS
40.0000 mg | ORAL_TABLET | Freq: Every day | ORAL | Status: DC
  Administered 2013-07-17 – 2013-07-20 (×4): 40 mg via ORAL
  Filled 2013-07-17 (×5): qty 1

## 2013-07-17 MED ORDER — HYDROCOD POLST-CHLORPHEN POLST 10-8 MG/5ML PO LQCR
5.0000 mL | Freq: Two times a day (BID) | ORAL | Status: DC | PRN
  Administered 2013-07-17 – 2013-07-21 (×5): 5 mL via ORAL
  Filled 2013-07-17 (×5): qty 5

## 2013-07-17 MED ORDER — PREDNISONE 20 MG PO TABS
40.0000 mg | ORAL_TABLET | Freq: Every day | ORAL | Status: DC
  Administered 2013-07-18 – 2013-07-22 (×5): 40 mg via ORAL
  Filled 2013-07-17 (×6): qty 2

## 2013-07-17 NOTE — Progress Notes (Addendum)
Physical Therapy Treatment Patient Pt.  Name: [name] MRN: [id number] DOB: 05-03-1960 Today's Date: 07/17/2013    History of Present Illness 53 yo female admited 06/23/13 withShortness of breath, hemoptysis, diffuse alveolar hemorrhage . Pt has  been in respiratory distress. Pt highly anxious.      PT Comments    Pt. In recliner, highly anxious and difficult to get her to focus on task at hand.  She did complete a few LE exercises but wanted to get the [place] to assist her back to bed.  I informed Dominica that pt. Wanted to go back to bed and she was to assist pt. With second person helping.  Pt. Did not want to participate in PT beyond doing the leg exercises .  Follow Up Recommendations  SNF;Supervision/Assistance - 24 hour     Equipment Recommendations  None recommended by PT    Recommendations for Other Services OT consult     Precautions / Restrictions Precautions Precautions: Fall Precaution Comments: chemo, monitor sats, plasmaphoresis, poor safety due to anxiety Restrictions Weight Bearing Restrictions: No    Mobility  Bed Mobility Overal bed mobility:  (not tested, pt. in recliner)                Transfers Overall transfer level:  (not tested)                  Ambulation/Gait                 Stairs            Wheelchair Mobility    Modified Rankin (Stroke Patients Only)       Balance                                    Cognition Arousal/Alertness: Awake/alert Behavior During Therapy: Restless;Anxious Overall Cognitive Status: Impaired/Different from baseline Area of Impairment: Safety/judgement;Problem solving;Following commands       Following Commands: Follows one step commands with increased time;Follows one step commands inconsistently Safety/Judgement: Decreased awareness of safety;Decreased awareness of deficits   Problem Solving: Requires verbal cues;Difficulty sequencing General  Comments: Pt. highly anxious and lacks focus.  Needs frequent redirection for task completion.  Anxiety limits mobility    Exercises General Exercises - Lower Extremity Ankle Circles/Pumps: AROM;Both;10 reps;Seated Long Arc Quad: AROM;Both;10 reps;Seated Hip Flexion/Marching: AROM;Both;10 reps;Seated    General Comments        Pertinent Vitals/Pain See vitals tab No specific pain but numerous generalized complaints    Home Living                      Prior Function            PT Goals (current goals can now be found in the care plan section) Progress towards PT goals: Progressing toward goals    Frequency  Min 3X/week    PT Plan Current plan remains appropriate    Co-evaluation             End of Session   Activity Tolerance: Other (comment) (limited by anxiousness and lack of focus) Patient left: in chair;with call bell/phone within reach     Time: 1515-1525 PT Time Calculation (min): 10 min  Charges:  $Therapeutic Exercise: 8-22 mins                    G Codes:  Ladona Ridgel 07/17/2013, 4:00 PM [facility] [phone] [place]

## 2013-07-17 NOTE — Progress Notes (Signed)
Patient ID: [name], female   DOB: August 03, 1960, 53 y.o.   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 Improving well on plasmapheresis/ corticosteroids. Plasmapheresis tomorrow 6 of 7. ANCA antibodies checked today 2. Hypertension: Marginally elevated blood pressures, continue to monitor  3. Obesity: advised on ambulation to avoid pressure sores 4. Anxiety / depression: Restarted back on her SSRIs Subjective:   Reports to be feeling better-denies any hemoptysis    Objective:   BP 130/85  Pulse 120  Temp(Src) 97.9 F (36.6 C) (Oral)  Resp 22  Ht 5\' 9"  (1.753 m)  Wt 140.706 kg (310 lb 3.2 oz)  BMI 45.79 kg/m2  SpO2 96%  Intake/Output Summary (Last 24 hours) at 07/17/13 1138 Last data filed at 07/17/13 0400  Gross per 24 hour  Intake    600 ml  Output   4350 ml  Net  -3750 ml   Weight change: -0.176 kg (-6.2 oz)  Physical Exam: Gen: Comfortably resting up in bed, husband by bedside CVS: Pulse regular tachycardia, S1 and S2 with ESM Resp: Intermittent expiratory wheeze Abd: Soft obese, nontender Ext: 1-2+ lower extremity edema around ankles  Imaging: No results found.  Labs: BMET  Recent Labs Lab 07/11/13 0530 07/12/13 1323 07/14/13 1129 07/15/13 0330 07/16/13 0858 07/17/13 0350  NA 142 138 140 142 138 140  K 4.2 3.7 3.6* 3.8 3.8 3.6*  CL 96 95* 97 101 88* 93*  CO2 34*  --   --  27  --  31  GLUCOSE 132* 295* 180* 186* 215* 136*  BUN 25* 17 23 23 22  28*  CREATININE 0.76 0.70 0.70 0.67 0.80 0.80  CALCIUM 8.9  --   --  8.5  --  9.2  PHOS 4.2  --   --  3.4  --   --    CBC  Recent Labs Lab 07/11/13 0530 07/12/13 1323 07/14/13 1129 07/16/13 0858 07/17/13 0350  WBC 19.8*  --   --   --  11.0*  HGB 8.1* 8.8* 8.8* 10.5* 9.1*  HCT 27.4* 26.0* 26.0* 31.0* 30.4*  MCV 85.6  --   --    --  88.4  PLT 210  --   --   --  154   Medications:    . ALPRAZolam  1 mg Oral TID  . anticoagulant sodium citrate  5 mL Intracatheter Once  . antiseptic oral rinse  15 mL Mouth Rinse BID  . atovaquone  1,500 mg Oral Q breakfast  . buPROPion  300 mg Oral Daily  . citalopram  40 mg Oral Daily  . folic acid  5 mg Oral Daily  . furosemide  40 mg Oral Daily  . insulin aspart  0-20 Units Subcutaneous TID WC  . pantoprazole  40 mg Oral QHS  . [START ON 07/18/2013] predniSONE  40 mg Oral Q breakfast  . QUEtiapine  100 mg Oral QHS  . sodium chloride  10-40 mL Intracatheter Q12H   Elmarie Shiley, MD 07/17/2013, 11:38 AM

## 2013-07-17 NOTE — Progress Notes (Signed)
PULMONARY / CRITICAL CARE MEDICINE   Name: [name] MRN: [id number] DOB: 07/18/1960    ADMISSION DATE:  06/23/2013 CONSULTATION DATE:  06/23/2013  REFERRING MD: Myers   CHIEF COMPLAINT: Shortness of breath, hemoptysis, diffuse alveolar hemorrhage   BRIEF PATIENT DESCRIPTION:  52-year-old female former smoker presented with cough, hemoptysis for 2 days.  Found to have hypoxia and diffuse pulmonary infiltrates.  Serology showed P ANCA 1:640, Myeloperoxidase > 8 .  SIGNIFICANT EVENTS: 6/15 Admit, PCCM consulted  6/16 Start high dose steroids, Transfuse PRBC 6/17 Start weekly Rituxan  6/18 Hematology consulted to assess for plasma exchange >> not indicated at this time 6/21 Transition to prednisone 6/23 Second dose of  weekly rituxan  6/29 Patient continues to need NRB 6/30 Started plasma exchange, weaned to HFNC 7/02 Neg 5 liters, plasma xch #2, same O2 needs 30 liters 7/04 Plasma exchange tx #3 7/06 Plasma exchange tx #4, pt on 5L [place] for 2 days, sats stable 7/7- sob resolving even with exertion 7/7 tx to floor 7/8 Plasma exchange #5/7  STUDIES: 6/15 CT chest >> diffuse b/l GGO 6/15 P ANCA 1:640, Myeloperoxidase > 8, HIV non reactive, Aldolase 4, CPK 31, Scl 70 < 1, anti GBM < 1, CCP < 2, RF < 10, ds DNA < 1, ANA negative, ESR 90, CRP 22, SSA < 1, SSB < 1 6/15 U/A >> 100 protein, large Hgb, moderate leukocytes 6/16 Echo >> EF 55 to 60% 6/16 Lupus anticoagulant dectected 6/17 Hep B IgM negative, Hep B core Ab negative, Hep B surface Ag negative, Hep B surface Ab negative, Hep C Ab negative 6/17 G6PD >> 22.8  LINES / TUBES:  Rt PICC 6/17 >>> HD cath 6/29 >>>  CULTURES:  Respiratory viral panel 6/15 >> negative Legionella 6/15 >> negative Blood 6/15 >> neg  ANTIBIOTICS:  Levaquin 6/15 >>> 6/17 Ceftriaxone 6/15 >>> one dose 6/15  Azithromycin 6/15 >>> one dose 6/15 Atovaquone 6/21 >>>   SUBJECTIVE:  Doing well, feels OK, coughing at night  VITAL SIGNS: Temp:   [97.9 F (36.6 C)-98.8 F (37.1 C)] 97.9 F (36.6 C) (07/09 0418) Pulse Rate:  [89-120] 97 (07/09 0418) Resp:  [14-21] 20 (07/09 0418) BP: (121-159)/(71-97) 124/71 mmHg (07/09 0418) SpO2:  [95 %-100 %] 96 % (07/09 0418) Weight:  [140.706 kg (310 lb 3.2 oz)-141.8 kg (312 lb 9.8 oz)] 140.706 kg (310 lb 3.2 oz) (07/09 0500) sats 95% on 5L  INTAKE / OUTPUT: Intake/Output     07/08 0701 - 07/09 0700 07/09 0701 - 07/10 0700   P.O. 1080    I.V. (mL/kg)     Total Intake(mL/kg) 1080 (7.7)    Urine (mL/kg/hr) 5500 (1.6)    Stool     Total Output 5500     Net -4420          Stool Occurrence 2 x     PHYSICAL EXAMINATION: General: Obese white female, NAD. Neuro: awake, alert, following commands HEENT: PERRL, NCAT, EOMi Cardiovascular: RRR, no m/g/r. PICC line and HD cath in place. Lungs: Crackles in bases Abdomen: Soft, non-distended. BS +, Obese Musculoskeletal: No peripheral edema noted.  LABS:  CBC  Recent Labs Lab 07/11/13 0530  07/14/13 1129 07/16/13 0858 07/17/13 0350  WBC 19.8*  --   --   --  11.0*  HGB 8.1*  < > 8.8* 10.5* 9.1*  HCT 27.4*  < > 26.0* 31.0* 30.4*  PLT 210  --   --   --  154  < > =   values in this interval not displayed. Coag's No results found for this basename: APTT, INR,  in the last 168 hours BMET  Recent Labs Lab 07/11/13 0530  07/15/13 0330 07/16/13 0858 07/17/13 0350  NA 142  < > 142 138 140  K 4.2  < > 3.8 3.8 3.6*  CL 96  < > 101 88* 93*  CO2 34*  --  27  --  31  BUN 25*  < > 23 22 28*  CREATININE 0.76  < > 0.67 0.80 0.80  GLUCOSE 132*  < > 186* 215* 136*  < > = values in this interval not displayed. Electrolytes  Recent Labs Lab 07/11/13 0530 07/15/13 0330 07/17/13 0350  CALCIUM 8.9 8.5 9.2  MG 2.3 2.0  --   PHOS 4.2 3.4  --     Sepsis Markers No results found for this basename: LATICACIDVEN, PROCALCITON, O2SATVEN,  in the last 168 hours ABG No results found for this basename: PHART, PCO2ART, PO2ART,  in the last 168  hours Liver Enzymes No results found for this basename: AST, ALT, ALKPHOS, BILITOT, ALBUMIN,  in the last 168 hours Cardiac Enzymes No results found for this basename: TROPONINI, PROBNP,  in the last 168 hours Glucose  Recent Labs Lab 07/15/13 1604 07/16/13 0729 07/16/13 1134 07/16/13 1706 07/16/13 2101 07/17/13 0724  GLUCAP 199* 154* 161* 165* 113* 115*    Imaging No results found. CXR: 7/7 Slight interval improvement in pulm interstitium c/w resolving interstitial edema  ASSESSMENT / PLAN:   RESPIRATORY:  Acute hypoxic respiratory failure due to [facility] from Microscopic polyangiitis > improving Chronic Cough > likely GERD, post nasal drip related Plan: - Wean O2 as able, may need low dose O2 at discharge - repeat CXR - tussionex at night - transition to prednisone 46m daily on 7/10 - Plan two more sessions Plasmapheresis - Plan Rituxan got375 mg/m2 after 7th Plasmapheresis session - Ct atovaquone 6/21 for PCP prophylaxis (sulfa allergy)_0 - [facility] will give  RENAL:  Lab Results  Component Value Date   CREATININE 0.80 07/17/2013   CREATININE 0.80 07/16/2013   CREATININE 0.67 07/15/2013   Bun 23->23 Protein and Hb in U/A on admission. 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  Plan:  - Continue Atovaquone for PCP prophylaxis - F/u CBC, high risk pancytopenia - Transfuse for Hgb < 7 - SCD for DVT prevention - Limit phlebotomy  ENDO:  CBG (last 3)   Recent Labs  07/16/13 1706 07/16/13 2101 07/17/13 0724  GLUCAP 165* 113* 115*     Steroid-induced hyperglycemia  improved Plan:  - SSI - Carb mod diet    PSYCH/NEURO: Anxiety Plan:  - Continue Bupropion, Celexa - Xanax on schedule - Seroquel 1037mQHS added - Pt complaining of pain, uncertain if this is solely anxiety related, fent increased    BrRoselie AwkwardMD LeLewistonCCM Pager: [phone]: [phone] no response, call [phone]

## 2013-07-18 LAB — BASIC METABOLIC PANEL
ANION GAP: 12 (ref 5–15)
BUN: 27 mg/dL — ABNORMAL HIGH (ref 6–23)
CHLORIDE: 96 meq/L (ref 96–112)
CO2: 34 meq/L — AB (ref 19–32)
Calcium: 9.2 mg/dL (ref 8.4–10.5)
Creatinine, Ser: 0.71 mg/dL (ref 0.50–1.10)
GFR calc non Af Amer: >90 mL/min (ref >90)
Glucose, Bld: 110 mg/dL — ABNORMAL HIGH (ref 70–99)
POTASSIUM: 3.6 meq/L — AB (ref 3.7–5.3)
SODIUM: 142 meq/L (ref 137–147)

## 2013-07-18 LAB — CBC WITH DIFFERENTIAL/PLATELET
BASOS PCT: 0 % (ref 0–1)
Basophils Absolute: 0 10*3/uL (ref 0.0–0.1)
EOS ABS: 0.2 10*3/uL (ref 0.0–0.7)
Eosinophils Relative: 3 % (ref 0–5)
HCT: 29.1 % — ABNORMAL LOW (ref 36.0–46.0)
Hemoglobin: 8.6 g/dL — ABNORMAL LOW (ref 12.0–15.0)
Lymphocytes Relative: 13 % (ref 12–46)
Lymphs Abs: 1.2 10*3/uL (ref 0.7–4.0)
MCH: 25.7 pg — ABNORMAL LOW (ref 26.0–34.0)
MCHC: 29.6 g/dL — ABNORMAL LOW (ref 30.0–36.0)
MCV: 86.9 fL (ref 78.0–100.0)
MONO ABS: 0.6 10*3/uL (ref 0.1–1.0)
Monocytes Relative: 7 % (ref 3–12)
NEUTROS ABS: 7.1 10*3/uL (ref 1.7–7.7)
Neutrophils Relative %: 77 % (ref 43–77)
Platelets: 161 10*3/uL (ref 150–400)
RBC: 3.35 MIL/uL — ABNORMAL LOW (ref 3.87–5.11)
RDW: 20.8 % — AB (ref 11.5–15.5)
WBC: 9.1 10*3/uL (ref 4.0–10.5)

## 2013-07-18 LAB — GLUCOSE, CAPILLARY
GLUCOSE-CAPILLARY: 147 mg/dL — AB (ref 70–99)
GLUCOSE-CAPILLARY: 152 mg/dL — AB (ref 70–99)
GLUCOSE-CAPILLARY: 180 mg/dL — AB (ref 70–99)
Glucose-Capillary: 233 mg/dL — ABNORMAL HIGH (ref 70–99)

## 2013-07-18 LAB — POCT I-STAT, CHEM 8
BUN: 25 mg/dL — AB (ref 6–23)
CALCIUM ION: 1.17 mmol/L (ref 1.12–1.23)
Chloride: 92 mEq/L — ABNORMAL LOW (ref 96–112)
Creatinine, Ser: 0.9 mg/dL (ref 0.50–1.10)
GLUCOSE: 174 mg/dL — AB (ref 70–99)
HCT: 30 % — ABNORMAL LOW (ref 36.0–46.0)
Hemoglobin: 10.2 g/dL — ABNORMAL LOW (ref 12.0–15.0)
Potassium: 3 mEq/L — ABNORMAL LOW (ref 3.7–5.3)
Sodium: 137 mEq/L (ref 137–147)
TCO2: 35 mmol/L (ref 0–100)

## 2013-07-18 LAB — MPO/PR-3 (ANCA) ANTIBODIES
Myeloperoxidase Abs: 4.5 — ABNORMAL HIGH
Serine Protease 3: <1

## 2013-07-18 MED ORDER — CALCIUM CARBONATE ANTACID 500 MG PO CHEW
2.0000 | CHEWABLE_TABLET | ORAL | Status: DC
  Administered 2013-07-18: 400 mg via ORAL

## 2013-07-18 MED ORDER — SODIUM CHLORIDE 0.9 % IV SOLN
INTRAVENOUS | Status: AC
  Administered 2013-07-18 (×4): via INTRAVENOUS_CENTRAL
  Filled 2013-07-18 (×5): qty 200

## 2013-07-18 MED ORDER — FENTANYL CITRATE 0.05 MG/ML IJ SOLN
INTRAMUSCULAR | Status: AC
  Filled 2013-07-18: qty 2

## 2013-07-18 MED ORDER — ACD FORMULA A 0.73-2.45-2.2 GM/100ML VI SOLN
Status: AC
  Administered 2013-07-18: 10:00:00
  Filled 2013-07-18: qty 500

## 2013-07-18 MED ORDER — DIPHENHYDRAMINE HCL 25 MG PO CAPS: 25.0000 mg | ORAL_CAPSULE | Freq: Four times a day (QID) | ORAL | Status: DC | PRN

## 2013-07-18 MED ORDER — ACD FORMULA A 0.73-2.45-2.2 GM/100ML VI SOLN
500.0000 mL | Status: DC
  Administered 2013-07-18: 500 mL via INTRAVENOUS
  Filled 2013-07-18: qty 500

## 2013-07-18 MED ORDER — ACD FORMULA A 0.73-2.45-2.2 GM/100ML VI SOLN
Status: AC
  Filled 2013-07-18: qty 500

## 2013-07-18 MED ORDER — SODIUM CHLORIDE 0.9 % IV SOLN
375.0000 mg/m2 | Freq: Once | INTRAVENOUS | Status: AC
  Administered 2013-07-20: 1000 mg via INTRAVENOUS
  Filled 2013-07-18 (×2): qty 100

## 2013-07-18 MED ORDER — CALCIUM GLUCONATE 10 % IV SOLN
4.0000 g | Freq: Once | INTRAVENOUS | Status: AC
  Administered 2013-07-18: 4 g via INTRAVENOUS
  Filled 2013-07-18 (×2): qty 40

## 2013-07-18 MED ORDER — HEPARIN SODIUM (PORCINE) 1000 UNIT/ML IJ SOLN: 1000.0000 [IU] | Freq: Once | INTRAMUSCULAR | Status: DC

## 2013-07-18 MED ORDER — CALCIUM CARBONATE ANTACID 500 MG PO CHEW
CHEWABLE_TABLET | ORAL | Status: AC
  Filled 2013-07-18: qty 2

## 2013-07-18 MED ORDER — ACETAMINOPHEN 325 MG PO TABS: 650.0000 mg | ORAL_TABLET | ORAL | Status: DC | PRN

## 2013-07-18 MED ORDER — SODIUM CHLORIDE 0.9 % IV SOLN: 375.0000 mg/m2 | Freq: Once | INTRAVENOUS | Status: DC

## 2013-07-18 NOTE — Procedures (Signed)
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 Tolerating treatment fairly-getting anxiolytic medication (chronic therapy) BP 148/70  Pulse 95  Temp(Src) 97.9 F (36.6 C) (Oral)  Resp 15  Ht 5\' 9"  (1.753 m)  Wt 139.8 kg (308 lb 3.3 oz)  BMI 45.49 kg/m2  SpO2 97%   Elmarie Shiley MD [facility]. Office # [phone] Pager # [phone] 9:38 AM

## 2013-07-18 NOTE — Progress Notes (Signed)
Physical Therapy Treatment Patient Details Name: [name] MRN: [id number] DOB: 07-Mar-1960 Today's Date: 07/18/2013    History of Present Illness 53 yo female admited 06/23/13 withShortness of breath, hemoptysis, diffuse alveolar hemorrhage . Pt has  been in respiratory distress. Pt highly anxious.      PT Comments    **Assisted pt to pivot from bed to bedside commode with min assist. With pivot, her SaO2 dropped to 83% on 4L [place], HR up to 130, she had 3/4 dyspnea. Gait deferred due to drop in O2 sats and dyspnea. Pt was encouraged to consider ST-SNF, however she adamantly refused and stated she plans to DC home with assist from her family. She has a WC and ramp, will likely need home O2. HHPT recommended. *  Follow Up Recommendations  Supervision/Assistance - 24 hour;Home health PT (pt adamantly refusing ST-SNF, stated her sister and mother can care for her at home, husband drives a truck and is gone 3 weeks at a time)     Equipment Recommendations  None recommended by PT    Recommendations for Other Services OT consult     Precautions / Restrictions      Mobility  Bed Mobility Overal bed mobility: Needs Assistance;+ 2 for safety/equipment Bed Mobility: Supine to Sit     Supine to sit: HOB elevated;Supervision;Modified independent (Device/Increase time)     General bed mobility comments: HOB elevated, pt pulled up using bedrails  Transfers Overall transfer level: Needs assistance Equipment used: Ambulation equipment used [name] plus) Transfers: Risk manager Sit to Stand: [employer] safety/equipment;From elevated surface Stand pivot transfers: +2 safety/equipment;Min assist (use of [employer] with pt standing in [place] for transfer)       General transfer comment: stand pivot transfer bed to [facility] holding onto armrest with min A to rise and pivot from elevated bed,  Then stood with [name] Plus for 1 minute for pericare and pivoted back to bed due to SOB, SaO2  dropping to 83% on 4 L O2, HR up to 130.   Ambulation/Gait             General Gait Details: Unable due to decr sats and pt anxiety.   Stairs            Wheelchair Mobility    Modified Rankin (Stroke Patients Only)       Balance Overall balance assessment: History of Falls;Needs assistance   Sitting balance-Leahy Scale: Good Sitting balance - Comments: Pt sat on EOB x 20 min, unsupported.      Standing balance-Leahy Scale: Poor                      Cognition Arousal/Alertness: Awake/alert Behavior During Therapy: Restless;Anxious                   General Comments: Pt. highly anxious and lacks focus.  Needs frequent redirection for task completion.      Exercises General Exercises - Lower Extremity Ankle Circles/Pumps: AROM;Both;Seated;15 reps Long Arc Quad: AROM;Both;Seated;15 reps Hip Flexion/Marching: AROM;Both;10 reps;Seated    General Comments        Pertinent Vitals/Pain **SaO2 83% on 4L O2 [place] with activity, HR 130, dyspnea 3/4 At rest SaO2 94% on 4L O2 No c/o pain*    Home Living                      Prior Function            PT  Goals (current goals can now be found in the care plan section) Acute Rehab PT Goals Patient Stated Goal: I want to go home. not use walker. PT Goal Formulation: With patient Time For Goal Achievement: 08/01/13 Potential to Achieve Goals: Good Progress towards PT goals: Progressing toward goals;Goals downgraded-see care plan    Frequency  Min 3X/week    PT Plan Current plan remains appropriate    Co-evaluation             End of Session   Activity Tolerance: Other (comment);Treatment limited secondary to medical complications (Comment) (limited by anxiousness and lack of focus, SaO2 dropped to 83% on 4L with activity) Patient left: with call bell/phone within reach;in bed     Time: 1325-1403 PT Time Calculation (min): 38 min  Charges:  $Therapeutic Exercise: 8-22  mins $Therapeutic Activity: 23-37 mins                    G Codes:      Philomena Doheny 07/18/2013, 2:20 PM [phone]

## 2013-07-18 NOTE — Progress Notes (Signed)
Patient ID: [name], female   DOB: [birth year], 53 y.o.   MRN: [id number]  [facility] Progress Note    Assessment/ Plan:   1 Resp failure / hemoptysis /DAH-with positive p-ANCA/myeloperoxidase indicative of microscopic polyangiitis without renal involvement. Improving well on plasmapheresis/ corticosteroids. She is currently getting plasmapheresis and is scheduled for her treatment #7 of 7 tomorrow in lieu of Sunday. Clinically, she continues to show improvement. Repeat ANCA antibody titer pending. 2. Hypertension: Marginally elevated blood pressures, continue to monitor  3. Obesity: advised on ambulation to avoid pressure sores  4. Anxiety / depression: Restarted back on her SSRIs  Subjective:   Reports to be feeling anxious-she did not take her empty anxiety medications prior to starting plasmapheresis and is requesting for it now.    Objective:   BP 148/70  Pulse 95  Temp(Src) 97.9 F (36.6 C) (Oral)  Resp 15  Ht 5\' 9"  (1.753 m)  Wt 139.8 kg (308 lb 3.3 oz)  BMI 45.49 kg/m2  SpO2 97%  Intake/Output Summary (Last 24 hours) at 07/18/13 0934 Last data filed at 07/18/13 0510  Gross per 24 hour  Intake      0 ml  Output   1750 ml  Net  -1750 ml   Weight change: -2.001 kg (-4 lb 6.6 oz)  Physical Exam: Gen: Restless on plasma pheresis-reports she is anxious CVS: Pulse regular in rate and rhythm, S1 and S2 normal Resp: Clear to auscultation bilaterally-no rales Abd: Soft, obese, nontender Ext: Trace ankle edema  Imaging: No results found.  Labs: BMET  Recent Labs Lab 07/12/13 1323 07/14/13 1129 07/15/13 0330 07/16/13 0858 07/17/13 0350 07/18/13 0552 07/18/13 0931  NA 138 140 142 138 140 142 137  K 3.7 3.6* 3.8 3.8 3.6* 3.6* 3.0*  CL 95* 97 101 88* 93* 96 92*  CO2  --   --  27  --  31 34*  --   GLUCOSE 295* 180* 186* 215* 136* 110* 174*  BUN 17 23 23 22  28* 27* 25*  CREATININE 0.70 0.70 0.67 0.80 0.80 0.71 0.90  CALCIUM  --   --  8.5  --   9.2 9.2  --   PHOS  --   --  3.4  --   --   --   --    CBC  Recent Labs Lab 07/16/13 0858 07/17/13 0350 07/18/13 0552 07/18/13 0931  WBC  --  11.0* 9.1  --   NEUTROABS  --   --  7.1  --   HGB 10.5* 9.1* 8.6* 10.2*  HCT 31.0* 30.4* 29.1* 30.0*  MCV  --  88.4 86.9  --   PLT  --  154 161  --    Medications:    . therapeutic plasma exchange solution   Dialysis Q1 Hr x 4  . ALPRAZolam  1 mg Oral TID  . anticoagulant sodium citrate  5 mL Intracatheter Once  . antiseptic oral rinse  15 mL Mouth Rinse BID  . atovaquone  1,500 mg Oral Q breakfast  . buPROPion  300 mg Oral Daily  . calcium carbonate  2 tablet Oral Q3H  . calcium gluconate IVPB  4 g Intravenous Once  . citalopram  40 mg Oral Daily  . folic acid  5 mg Oral Daily  . furosemide  40 mg Oral Daily  . heparin  1,000 Units Intracatheter Once  . insulin aspart  0-20 Units Subcutaneous TID WC  . pantoprazole  40 mg Oral  QHS  . predniSONE  40 mg Oral Q breakfast  . QUEtiapine  100 mg Oral QHS  . sodium chloride  10-40 mL Intracatheter Q12H   Elmarie Shiley, MD 07/18/2013, 9:34 AM

## 2013-07-18 NOTE — Progress Notes (Signed)
PULMONARY / CRITICAL CARE MEDICINE   Name: [name] MRN: [id number] DOB: Mar 28, 1960    ADMISSION DATE:  06/23/2013 CONSULTATION DATE:  06/23/2013  REFERRING MD: Doyle Askew   CHIEF COMPLAINT: Shortness of breath, hemoptysis, diffuse alveolar hemorrhage   BRIEF PATIENT DESCRIPTION:  53 year old female former smoker presented with cough, hemoptysis for 2 days.  Found to have hypoxia and diffuse pulmonary infiltrates.  Serology showed P ANCA 1:640, Myeloperoxidase > 8 .  SIGNIFICANT EVENTS: 6/15 Admit, PCCM consulted  6/16 Start high dose steroids, Transfuse PRBC 6/17 Start weekly Rituxan  6/18 Hematology consulted to assess for plasma exchange >> not indicated at this time 6/21 Transition to prednisone 6/23 Second dose of  weekly rituxan  6/29 Patient continues to need NRB 6/30 Started plasma exchange, weaned to [facility] 7/02 Neg 5 liters, plasma xch #2, same O2 needs 30 liters 7/04 Plasma exchange tx #3 7/06 Plasma exchange tx #4, pt on 5L [place] for 2 days, sats stable 7/7- sob resolving even with exertion 7/7 tx to floor 7/8 Plasma exchange #5/7 7/10 plasma exchange 6/7  STUDIES: 6/15 CT chest >> diffuse b/l GGO 6/15 P ANCA 1:640, Myeloperoxidase > 8, HIV non reactive, Aldolase 4, CPK 31, Scl 70 < 1, anti GBM < 1, CCP < 2, RF < 10, ds DNA < 1, ANA negative, ESR 90, CRP 22, SSA < 1, SSB < 1 6/15 U/A >> 100 protein, large Hgb, moderate leukocytes 6/16 Echo >> EF 55 to 60% 6/16 Lupus anticoagulant dectected 6/17 Hep B IgM negative, Hep B core Ab negative, Hep B surface Ag negative, Hep B surface Ab negative, Hep C Ab negative 6/17 G6PD >> 22.8  LINES / TUBES:  Rt PICC 6/17 >>> HD cath 6/29 >>>  CULTURES:  Respiratory viral panel 6/15 >> negative Legionella 6/15 >> negative Blood 6/15 >> neg  ANTIBIOTICS:  Levaquin 6/15 >>> 6/17 Ceftriaxone 6/15 >>> one dose 6/15  Azithromycin 6/15 >>> one dose 6/15 Atovaquone 6/21 >>>   SUBJECTIVE:  Anxious, wants to go home  VITAL  SIGNS: Temp:  [97.3 F (36.3 C)-98.8 F (37.1 C)] 98 F (36.7 C) (07/10 1131) Pulse Rate:  [89-113] 96 (07/10 1131) Resp:  [12-23] 16 (07/10 1131) BP: (108-149)/(58-80) 113/58 mmHg (07/10 1131) SpO2:  [94 %-100 %] 95 % (07/10 1131) Weight:  [308 lb 3.2 oz (139.799 kg)-310 lb 13.6 oz (141 kg)] 310 lb 13.6 oz (141 kg) (07/10 0915) sats 95% on 5L  INTAKE / OUTPUT: Intake/Output     07/09 0701 - 07/10 0700 07/10 0701 - 07/11 0700   P.O.     Total Intake(mL/kg)     Urine (mL/kg/hr) 1750 (0.5)    Total Output 1750     Net -1750           PHYSICAL EXAMINATION: General: Obese white female, NAD. Anxi[employer]os Neuro: awake, alert, following commands HEENT: PERRL, NCAT, EOMi Cardiovascular: RRR, no m/g/r. PICC line and HD cath in place. Lungs: diminished in bases  Abdomen: Soft, non-distended. BS +, Obese Musculoskeletal: No peripheral edema noted.  LABS:  CBC  Recent Labs Lab 07/17/13 0350 07/18/13 0552 07/18/13 0931  WBC 11.0* 9.1  --   HGB 9.1* 8.6* 10.2*  HCT 30.4* 29.1* 30.0*  PLT 154 161  --    Coag's No results found for this basename: APTT, INR,  in the last 168 hours BMET  Recent Labs Lab 07/15/13 0330  07/17/13 0350 07/18/13 0552 07/18/13 0931  NA 142  < > 140 142 137  K 3.8  < > 3.6* 3.6* 3.0*  CL 101  < > 93* 96 92*  CO2 27  --  31 34*  --   BUN 23  < > 28* 27* 25*  CREATININE 0.67  < > 0.80 0.71 0.90  GLUCOSE 186*  < > 136* 110* 174*  < > = values in this interval not displayed. Electrolytes  Recent Labs Lab 07/15/13 0330 07/17/13 0350 07/18/13 0552  CALCIUM 8.5 9.2 9.2  MG 2.0  --   --   PHOS 3.4  --   --     Sepsis Markers No results found for this basename: LATICACIDVEN, PROCALCITON, O2SATVEN,  in the last 168 hours ABG No results found for this basename: PHART, PCO2ART, PO2ART,  in the last 168 hours Liver Enzymes No results found for this basename: AST, ALT, ALKPHOS, BILITOT, ALBUMIN,  in the last 168 hours Cardiac Enzymes No results  found for this basename: TROPONINI, PROBNP,  in the last 168 hours Glucose  Recent Labs Lab 07/16/13 2101 07/17/13 0724 07/17/13 1145 07/17/13 1714 07/17/13 2033 07/18/13 0750  GLUCAP 113* 115* 210* 184* 213* 147*    Imaging No results found. CXR: 7/7 Slight interval improvement in pulm interstitium c/w resolving interstitial edema  ASSESSMENT / PLAN:   RESPIRATORY:  Acute hypoxic respiratory failure due to [facility] from Microscopic polyangiitis > improving Chronic Cough > likely GERD, post nasal drip related Plan: - Wean O2 as able, may need low dose O2 at discharge - repeat CXR as needed - tussionex at night - maintain prednisone 31m daily through discharge - Plasmapheresis session #7 (final) on 7/11 - Plan Rituxan got375 mg/m2 on 7/12 assuming - Ct atovaquone 6/21 for PCP prophylaxis (sulfa allergy)\ - [facility] (per Dr. AOuida Sillswe need to call on day of discharge to arrange f/u for next day)  RENAL:  Lab Results  Component Value Date   CREATININE 0.90 07/18/2013   CREATININE 0.71 07/18/2013   CREATININE 0.80 07/17/2013   Bun 23->23 Protein and Hb in U/A on admission. 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  Plan:  - Continue Atovaquone for PCP prophylaxis - F/u CBC, high risk pancytopenia - Transfuse for Hgb < 7 - SCD for DVT prevention - Limit phlebotomy  ENDO:  CBG (last 3)   Recent Labs  07/17/13 1714 07/17/13 2033 07/18/13 0750  GLUCAP 184* 213* 147*     Steroid-induced hyperglycemia improved Plan:  - SSI - Carb mod diet    PSYCH/NEURO: Anxiety Plan:   - Continue Bupropion, Celexa - Xanax on schedule - Seroquel 1078mQHS added   StRichardson Landryinor ACNP LeMaryanna ShapeCCM Pager [phone] 3 pm If no answer page [phone]/10/2013, 11:52 AM   Attending:  I have seen and examined the patient with nurse practitioner/resident and agree with the note above.   Plasma exchange #7 tomorrow  Likely discharge Monday  PT to ambulate if possible, they recommend SNF, patient prefers to go home; will ask them if home PT possible  BrRoselie AwkwardMD LeHazlehurstCCM Pager:  [phone] Cell: [phone] If no response, call [phone]

## 2013-07-18 NOTE — Progress Notes (Signed)
PT Cancellation NOte  Pt not available for treatment, she was at plasmaphoresis. Will follow.   Blondell Reveal Kistler PT 07/18/2013  [phone]

## 2013-07-18 NOTE — Progress Notes (Addendum)
Inpatient Diabetes Program Recommendations  AACE/ADA: New Consensus Statement on Inpatient Glycemic Control (2013)  Target Ranges:  Prepandial:   less than 140 mg/dL      Peak postprandial:   less than 180 mg/dL (1-2 hours)      Critically ill patients:  140 - 180 mg/dL     Results for BRELAND, KILCREASE (MRN [id number]) as of 07/18/2013 07:11  Ref. Range 07/17/2013 07:24 07/17/2013 11:45 07/17/2013 17:14 07/17/2013 20:33  Glucose-Capillary Latest Range: 70-99 mg/dL 115 (H) 210 (H) 184 (H) 213 (H)    Patient getting Prednisone 40 mg daily.  Having some postprandial glucose excursions.   MD- If PO intake decent, may want to consider adding Novolog Meal Coverage- Novolog 3 units tid with meals    Will follow Wyn Quaker RN, MSN, CDE Diabetes Coordinator Inpatient Diabetes Program Team Pager: [phone] (8a-10p)

## 2013-07-19 LAB — POCT I-STAT, CHEM 8
BUN: 22 mg/dL (ref 6–23)
CALCIUM ION: 1.2 mmol/L (ref 1.12–1.23)
Chloride: 96 mEq/L (ref 96–112)
Creatinine, Ser: 0.9 mg/dL (ref 0.50–1.10)
GLUCOSE: 138 mg/dL — AB (ref 70–99)
HEMATOCRIT: 30 % — AB (ref 36.0–46.0)
HEMOGLOBIN: 10.2 g/dL — AB (ref 12.0–15.0)
Potassium: 3 mEq/L — ABNORMAL LOW (ref 3.7–5.3)
Sodium: 140 mEq/L (ref 137–147)
TCO2: 32 mmol/L (ref 0–100)

## 2013-07-19 LAB — GLUCOSE, CAPILLARY
GLUCOSE-CAPILLARY: 104 mg/dL — AB (ref 70–99)
GLUCOSE-CAPILLARY: 194 mg/dL — AB (ref 70–99)
Glucose-Capillary: 136 mg/dL — ABNORMAL HIGH (ref 70–99)
Glucose-Capillary: 210 mg/dL — ABNORMAL HIGH (ref 70–99)

## 2013-07-19 MED ORDER — SODIUM CHLORIDE 0.9 % IV SOLN
4.0000 g | Freq: Once | INTRAVENOUS | Status: AC
  Administered 2013-07-19: 4 g via INTRAVENOUS
  Filled 2013-07-19: qty 40

## 2013-07-19 MED ORDER — ACD FORMULA A 0.73-2.45-2.2 GM/100ML VI SOLN
Status: AC
  Administered 2013-07-19: 10:00:00
  Filled 2013-07-19: qty 500

## 2013-07-19 MED ORDER — CALCIUM CARBONATE ANTACID 500 MG PO CHEW
2.0000 | CHEWABLE_TABLET | ORAL | Status: DC
  Administered 2013-07-19: 400 mg via ORAL

## 2013-07-19 MED ORDER — DIPHENHYDRAMINE HCL 25 MG PO CAPS: 25.0000 mg | ORAL_CAPSULE | Freq: Four times a day (QID) | ORAL | Status: DC | PRN

## 2013-07-19 MED ORDER — SODIUM CHLORIDE 0.9 % IV SOLN
INTRAVENOUS | Status: AC
  Administered 2013-07-19 (×4): via INTRAVENOUS_CENTRAL
  Filled 2013-07-19 (×4): qty 200

## 2013-07-19 MED ORDER — ACETAMINOPHEN 325 MG PO TABS: 650.0000 mg | ORAL_TABLET | ORAL | Status: DC | PRN

## 2013-07-19 MED ORDER — ACD FORMULA A 0.73-2.45-2.2 GM/100ML VI SOLN
500.0000 mL | Status: DC
  Administered 2013-07-19: 500 mL via INTRAVENOUS
  Filled 2013-07-19: qty 500

## 2013-07-19 MED ORDER — FENTANYL CITRATE 0.05 MG/ML IJ SOLN
INTRAMUSCULAR | Status: AC
  Filled 2013-07-19: qty 2

## 2013-07-19 MED ORDER — CALCIUM CARBONATE ANTACID 500 MG PO CHEW
CHEWABLE_TABLET | ORAL | Status: AC
  Filled 2013-07-19: qty 2

## 2013-07-19 MED ORDER — HEPARIN SODIUM (PORCINE) 1000 UNIT/ML IJ SOLN: 1000.0000 [IU] | Freq: Once | INTRAMUSCULAR | Status: DC

## 2013-07-19 MED ORDER — ACD FORMULA A 0.73-2.45-2.2 GM/100ML VI SOLN
Status: AC
  Filled 2013-07-19: qty 500

## 2013-07-19 NOTE — Progress Notes (Signed)
PULMONARY / CRITICAL CARE MEDICINE   Name: [name] MRN: [id number] DOB: [birth year]    ADMISSION DATE:  06/23/2013 CONSULTATION DATE:  06/23/2013  REFERRING MD: Doyle Askew   CHIEF COMPLAINT: Shortness of breath, hemoptysis, diffuse alveolar hemorrhage   BRIEF PATIENT DESCRIPTION:  53 year old female former smoker presented with cough, hemoptysis for 2 days.  Found to have hypoxia and diffuse pulmonary infiltrates.  Serology showed P ANCA 1:640, Myeloperoxidase > 8 .  SIGNIFICANT EVENTS: 6/15 Admit, PCCM consulted  6/16 Start high dose steroids, Transfuse PRBC 6/17 Start weekly Rituxan  6/18 Hematology consulted to assess for plasma exchange >> not indicated at this time 6/21 Transition to prednisone 6/23 Second dose of  weekly rituxan  6/29 Patient continues to need NRB 6/30 Started plasma exchange, weaned to [facility] 7/02 Neg 5 liters, plasma xch #2, same O2 needs 30 liters 7/04 Plasma exchange tx #3 7/06 Plasma exchange tx #4, pt on 5L [place] for 2 days, sats stable 7/7- sob resolving even with exertion 7/7 tx to floor 7/8 Plasma exchange  #5/7 7/10 plasma exchange #6/7 7/10 Plasma exchange # 7/7  STUDIES: 6/15 CT chest >> diffuse b/l GGO 6/15 P ANCA 1:640, Myeloperoxidase > 8, HIV non reactive, Aldolase 4, CPK 31, Scl 70 < 1, anti GBM < 1, CCP < 2, RF < 10, ds DNA < 1, ANA negative, ESR 90, CRP 22, SSA < 1, SSB < 1 6/15 U/A >> 100 protein, large Hgb, moderate leukocytes 6/16 Echo >> EF 55 to 60% 6/16 Lupus anticoagulant dectected 6/17 Hep B IgM negative, Hep B core Ab negative, Hep B surface Ag negative, Hep B surface Ab negative, Hep C Ab negative 6/17 G6PD >> 22.8  LINES / TUBES:  Rt PICC 6/17 >>> HD cath 6/29 >>>  CULTURES:  Respiratory viral panel 6/15 >> negative Legionella 6/15 >> negative Blood 6/15 >> neg  ANTIBIOTICS:  Levaquin 6/15 >>> 6/17 Ceftriaxone 6/15 >>> one dose 6/15  Azithromycin 6/15 >>> one dose 6/15 Atovaquone 6/21 >>>   SUBJECTIVE:  nad on  plasmaphersis    VITAL SIGNS: Temp:  [96.8 F (36 C)-98.7 F (37.1 C)] 96.9 F (36.1 C) (07/11 1027) Pulse Rate:  [91-130] 92 (07/11 1027) Resp:  [10-18] 12 (07/11 1027) BP: (105-146)/(43-82) 105/56 mmHg (07/11 1027) SpO2:  [83 %-100 %] 100 % (07/11 1027) Weight:  [310 lb 13.6 oz (141.001 kg)-312 lb 2.7 oz (141.6 kg)] 312 lb 2.7 oz (141.6 kg) (07/11 0500) FI02  :  4lpm NP  INTAKE / OUTPUT: Intake/Output     07/10 0701 - 07/11 0700 07/11 0701 - 07/12 0700   P.O. 1800    Total Intake(mL/kg) 1800 (12.7)    Urine (mL/kg/hr) 6550 (1.9)    Stool 1 (0)    Total Output 6551     Net -4751          Urine Occurrence     Stool Occurrence      PHYSICAL EXAMINATION: General: Obese white female, NAD.   Neuro: awake, alert, following commands HEENT: PERRL, NCAT, EOMi Cardiovascular: RRR, no m/g/r. PICC line and HD cath in place. Lungs: diminished in bases  Abdomen: Soft, non-distended. BS +, Obese Musculoskeletal: No peripheral edema noted.  LABS:  CBC  Recent Labs Lab 07/17/13 0350 07/18/13 0552 07/18/13 0931 07/19/13 0909  WBC 11.0* 9.1  --   --   HGB 9.1* 8.6* 10.2* 10.2*  HCT 30.4* 29.1* 30.0* 30.0*  PLT 154 161  --   --  Coag's No results found for this basename: APTT, INR,  in the last 168 hours BMET  Recent Labs Lab 07/15/13 0330  07/17/13 0350 07/18/13 0552 07/18/13 0931 07/19/13 0909  NA 142  < > 140 142 137 140  K 3.8  < > 3.6* 3.6* 3.0* 3.0*  CL 101  < > 93* 96 92* 96  CO2 27  --  31 34*  --   --   BUN 23  < > 28* 27* 25* 22  CREATININE 0.67  < > 0.80 0.71 0.90 0.90  GLUCOSE 186*  < > 136* 110* 174* 138*  < > = values in this interval not displayed. Electrolytes  Recent Labs Lab 07/15/13 0330 07/17/13 0350 07/18/13 0552  CALCIUM 8.5 9.2 9.2  MG 2.0  --   --   PHOS 3.4  --   --     Sepsis Markers No results found for this basename: LATICACIDVEN, PROCALCITON, O2SATVEN,  in the last 168 hours ABG No results found for this basename:  PHART, PCO2ART, PO2ART,  in the last 168 hours Liver Enzymes No results found for this basename: AST, ALT, ALKPHOS, BILITOT, ALBUMIN,  in the last 168 hours Cardiac Enzymes No results found for this basename: TROPONINI, PROBNP,  in the last 168 hours Glucose  Recent Labs Lab 07/17/13 2033 07/18/13 0750 07/18/13 1150 07/18/13 1654 07/18/13 2133 07/19/13 0718  GLUCAP 213* 147* 152* 233* 180* 104*    Imaging No results found. CXR: 7/7 Slight interval improvement in pulm interstitium c/w resolving interstitial edema  ASSESSMENT / PLAN:   RESPIRATORY:  Acute hypoxic respiratory failure due to [facility] from Microscopic polyangiitis > improving Chronic Cough > likely GERD, post nasal drip related Plan: - Wean O2 as able, may need low dose O2 at discharge - repeat CXR as needed - tussionex at night - maintain prednisone 73m daily through discharge - Plasmapheresis session #7 (final) copmpleted 7/11 - Plan Rituxan got375 mg/m2 on 7/12   - Ct atovaquone 6/21 for PCP prophylaxis (sulfa allergy)\ - [facility] (per Dr. AOuida Sillswe need to call on day of discharge to arrange f/u for next day)  RENAL:  Lab Results  Component Value Date   CREATININE 0.90 07/19/2013   CREATININE 0.90 07/18/2013   CREATININE 0.71 07/18/2013   Bun 23->23 Protein and Hb in U/A on admission. 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  Plan:  - Continue Atovaquone for PCP prophylaxis - F/u CBC, high risk pancytopenia - Transfuse for Hgb < 7 - SCD for DVT prevention - Limit phlebotomy  ENDO:  CBG (last 3)    Recent Labs  07/18/13 1654 07/18/13 2133 07/19/13 0718  GLUCAP 233* 180* 104*     Steroid-induced hyperglycemia improved Plan:  - SSI - Carb mod diet    PSYCH/NEURO: Anxiety Plan:  - Continue Bupropion, Celexa - Xanax on schedule - Seroquel 1043mQHS added    Likely discharge Monday  PT to ambulate if possible, they recommend SNF, patient prefers to go home; will ask them if home PT possible  Christinia Gully, MD Pulmonary and [place] [phone] After 5:30 PM or weekends, call [phone]

## 2013-07-19 NOTE — Procedures (Signed)
Patient seen on plasma pheresis treatment #7/7 for microscopic polyangiitis (ANCA associated) with DAH Blood pressure 119/43 Treatment adjusted as needed.  Elmarie Shiley MD [facility] Office # [phone] Pager # [phone] 10:00 AM

## 2013-07-19 NOTE — Progress Notes (Signed)
Patient ID: [name], female   DOB: 18-Mar-1960, 53 y.o.   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 Improving well on plasmapheresis/ corticosteroids. She is currently getting plasmapheresis  treatment #7 of 7 . Clinically, she continues to show improvement. Repeat ANCA antibody titer shows decreasing myeloperoxidase antibody.  2. Hypertension: Marginally elevated blood pressures, continue to monitor  3. Obesity: advised on ambulation to avoid pressure sores  4. 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  Subjective:   Reports to be feeling fair-trying to alleviate anxiety on plasmapheresis by sleeping    Objective:   BP 140/74  Pulse 91  Temp(Src) 96.9 F (36.1 C) (Oral)  Resp 11  Ht 5\' 9"  (1.753 m)  Wt 141.6 kg (312 lb 2.7 oz)  BMI 46.08 kg/m2  SpO2 98%  Intake/Output Summary (Last 24 hours) at 07/19/13 0957 Last data filed at 07/19/13 [id number]  Gross per 24 hour  Intake   1560 ml  Output   6151 ml  Net  -4591 ml   Weight change: 1.201 kg (2 lb 10.4 oz)  Physical Exam: Gen: Resting comfortably on plasma pheresis [id number] regular in rate and rhythm, S1 and S2 normal Resp: fine rales left base and intermittent expiratory wheeze Abd: Soft, obese, nontender Ext: Trace to 1+ lower extremity edema  Imaging: No results found.  Labs: BMET  Recent Labs Lab 07/14/13 1129 07/15/13 0330 07/16/13 0858 07/17/13 0350 07/18/13 0552 07/18/13 0931 07/19/13 0909  NA 140 142 138 140 142 137 140  K 3.6* 3.8 3.8 3.6* 3.6* 3.0* 3.0*  CL 97 101 88* 93* 96 92* 96  CO2  --  27  --  31 34*  --   --   GLUCOSE 180* 186* 215*  136* 110* 174* 138*  BUN 23 23 22  28* 27* 25* 22  CREATININE 0.70 0.67 0.80 0.80 0.71 0.90 0.90  CALCIUM  --  8.5  --  9.2 9.2  --   --   PHOS  --  3.4  --   --   --   --   --    CBC  Recent Labs Lab 07/17/13 0350 07/18/13 0552 07/18/13 0931 07/19/13 0909  WBC 11.0* 9.1  --   --   NEUTROABS  --  7.1  --   --   HGB 9.1* 8.6* 10.2* 10.2*  HCT 30.4* 29.1* 30.0* 30.0*  MCV 88.4 86.9  --   --   PLT 154 161  --   --     Medications:    . therapeutic plasma exchange solution   Dialysis Q1 Hr x 4  . ALPRAZolam  1 mg Oral TID  . anticoagulant sodium citrate  5 mL Intracatheter Once  . antiseptic oral rinse  15 mL Mouth Rinse BID  . atovaquone  1,500 mg Oral Q breakfast  . buPROPion  300 mg Oral Daily  . calcium carbonate  2 tablet Oral Q3H  . calcium gluconate IVPB  4 g Intravenous Once  . citalopram  40 mg Oral Daily  . fentaNYL      . folic acid  5 mg Oral Daily  . furosemide  40 mg Oral Daily  . heparin  1,000 Units  Intracatheter Once  . insulin aspart  0-20 Units Subcutaneous TID WC  . pantoprazole  40 mg Oral QHS  . predniSONE  40 mg Oral Q breakfast  . QUEtiapine  100 mg Oral QHS  . [START ON 07/20/2013] riTUXimab (RITUXAN) IV infusion  375 mg/m2 Intravenous Once  . sodium chloride  10-40 mL Intracatheter Q12H   Elmarie Shiley, MD 07/19/2013, 9:57 AM

## 2013-07-20 DIAGNOSIS — E876 Hypokalemia: Secondary | ICD-10-CM | POA: Diagnosis not present

## 2013-07-20 HISTORY — DX: Hypokalemia: E87.6

## 2013-07-20 LAB — GLUCOSE, CAPILLARY
GLUCOSE-CAPILLARY: 105 mg/dL — AB (ref 70–99)
GLUCOSE-CAPILLARY: 212 mg/dL — AB (ref 70–99)
GLUCOSE-CAPILLARY: 209 mg/dL — AB (ref 70–99)
Glucose-Capillary: 245 mg/dL — ABNORMAL HIGH (ref 70–99)

## 2013-07-20 MED ORDER — POTASSIUM CHLORIDE CRYS ER 20 MEQ PO TBCR
40.0000 meq | EXTENDED_RELEASE_TABLET | Freq: Every day | ORAL | Status: DC
  Administered 2013-07-20: 40 meq via ORAL
  Filled 2013-07-20 (×2): qty 2

## 2013-07-20 NOTE — Clinical Social Work Psychosocial (Signed)
Clinical Social Work Department BRIEF PSYCHOSOCIAL ASSESSMENT 07/20/2013  Patient:  [name],[name]     Account Number:  [id number]     Admit date:  06/23/2013  Clinical Social Worker:  Patrick-jefferson,, LCSWA  Date/Time:  07/20/2013 05:15 PM  Referred by:  Physician  Date Referred:  07/20/2013 Referred for  SNF Placement   Other Referral:   Interview type:  Patient Other interview type:    PSYCHOSOCIAL DATA Living Status:  HUSBAND Admitted from facility:   Level of care:   Primary support name:  [name] ([phone]) Primary support relationship to patient:  SPOUSE Degree of support available:   Good    CURRENT CONCERNS Current Concerns  Post-Acute Placement   Other Concerns:    SOCIAL WORK ASSESSMENT / PLAN CSW met with patient who was alert and oriented x4. CSW introduced self and explained role. CSW discussed SNF process and d/c plan with patient. Per patient, she resides at home with her husband. Patient stated does not want to go anywhere else where she is "told what to do, what to eat." Patient stated she knows her husband cannot assist her at home at the level she needs, but she is tired of being in the hospital and just really wants to go home. CSW discussed supervision/assistance at home. Patient stated she doesn't have anyone to be available for 24 hours a day. CSW discussed benefits associated with SNF to include increased strength and mobility. Patient began to cry and stated she knows she needs some sort of help. Per patient, her mother, sister, and husband all feel as if she needs some rehab at a SNF. Patient is agreeable to SNF referral and listed [facility] as preference as it is close to family.   Assessment/plan status:  Information/Referral to Community Resources Other assessment/ plan:   CSW will complete FL2 for SNF placement.   Information/referral to community resources:   CSW provided patient with community SNF list.     PATIENT'S/FAMILY'S RESPONSE TO PLAN OF CARE: Patient was cooperative and engaging. Patient  is agreeable to SNF placement, and will refer list to narrow down options.    Patrick-Jefferson, LCSWA Weekend Clinical Social Worker [phone]     

## 2013-07-20 NOTE — Progress Notes (Signed)
Patient discussing options for discharge with Probation officer.  States she wants to go home with family and that there would be 24/7 care.  Family in favor of re - hab, however, patient is not.  She is not mobile at this time.  Refusing to get up to chair or to participate in leg exercises.  States we are "setting her up".  Has multiple complaints, including feeling as if she has a "thicker neck".  Easily upset and provokes herself into anxiety attacks.  States she feels as though she is being "pushed out of the door."   Visits with patient should be brief and focused.  She is able to ambulate in room, however de - sats to 80's off of oxygen.  She states she wants PT at home.  Refuses to have foley catheter removed until day of discharge.  States, "I will report anyone who tries".  She feels that her family is trying to "force" her into making the decision to have re-hab.  States she feels she could do equally as good at home.  However, she continually states she cannot do basic tasks on her own or mobilize without assist and wants family to care for her.

## 2013-07-20 NOTE — Progress Notes (Signed)
PULMONARY / CRITICAL CARE MEDICINE   Name: [name] MRN: [id number] DOB: 01-Mar-1960    ADMISSION DATE:  06/23/2013 CONSULTATION DATE:  06/23/2013  REFERRING MD: Doyle Askew   CHIEF COMPLAINT: Shortness of breath, hemoptysis, diffuse alveolar hemorrhage   BRIEF PATIENT DESCRIPTION:  53 year old female former smoker presented with cough, hemoptysis for 2 days.  Found to have hypoxia and diffuse pulmonary infiltrates.  Serology showed P ANCA 1:640, Myeloperoxidase > 8 .  SIGNIFICANT EVENTS: 6/15 Admit, PCCM consulted  6/16 Start high dose steroids, Transfuse PRBC 6/17 Start weekly Rituxan  6/18 Hematology consulted to assess for plasma exchange >> not indicated at this time 6/21 Transition to prednisone 6/23 Second dose of  weekly rituxan  6/29 Patient continues to need NRB 6/30 Started plasma exchange, weaned to [facility] 7/02 Neg 5 liters, plasma xch #2, same O2 needs 30 liters 7/04 Plasma exchange tx #3 7/06 Plasma exchange tx #4, pt on 5L [place] for 2 days, sats stable 7/7- sob resolving even with exertion 7/7 tx to floor 7/8 Plasma exchange  #5/7 7/10 plasma exchange #6/7 7/10 Plasma exchange # 7/7  STUDIES: 6/15 CT chest >> diffuse b/l GGO 6/15 P ANCA 1:640, Myeloperoxidase > 8, HIV non reactive, Aldolase 4, CPK 31, Scl 70 < 1, anti GBM < 1, CCP < 2, RF < 10, ds DNA < 1, ANA negative, ESR 90, CRP 22, SSA < 1, SSB < 1 6/15 U/A >> 100 protein, large Hgb, moderate leukocytes 6/16 Echo >> EF 55 to 60% 6/16 Lupus anticoagulant dectected 6/17 Hep B IgM negative, Hep B core Ab negative, Hep B surface Ag negative, Hep B surface Ab negative, Hep C Ab negative 6/17 G6PD >> 22.8  LINES / TUBES:  Rt PICC 6/17 >>> HD cath 6/29 >>>  CULTURES:  Respiratory viral panel 6/15 >> negative Legionella 6/15 >> negative Blood 6/15 >> neg  ANTIBIOTICS:  Levaquin 6/15 >>> 6/17 Ceftriaxone 6/15 >>> one dose 6/15  Azithromycin 6/15 >>> one dose 6/15 Atovaquone 6/21 >>>   SUBJECTIVE:  nad at  rest on 02, easily agitated re discharge planning (see nursing note by Wannetta Sender today)   VITAL SIGNS: Temp:  [97.8 F (36.6 C)-98.1 F (36.7 C)] 97.8 F (36.6 C) (07/12 0454) Pulse Rate:  [60-83] 83 (07/12 0454) Resp:  [18-20] 18 (07/12 0454) BP: (101-157)/(69-74) 157/74 mmHg (07/12 0454) SpO2:  [96 %-99 %] 97 % (07/12 0454) Weight:  [311 lb 11.7 oz (141.4 kg)-312 lb 2.7 oz (141.599 kg)] 311 lb 11.7 oz (141.4 kg) (07/12 0500) FI02  :  4lpm NP  INTAKE / OUTPUT: Intake/Output     07/11 0701 - 07/12 0700 07/12 0701 - 07/13 0700   P.O. 1500 360   I.V. (mL/kg) 17.5 (0.1)    Total Intake(mL/kg) 1517.5 (10.7) 360 (2.5)   Urine (mL/kg/hr) 8250 (2.4)    Stool     Total Output 8250     Net -6732.5 +360        Stool Occurrence 1 x     PHYSICAL EXAMINATION: General: Obese white female, NAD.   Neuro: awake, alert, following commands HEENT: PERRL, NCAT, EOMi Cardiovascular: RRR, no m/g/r. PICC line and HD cath in place. Lungs: diminished in bases  Abdomen: Soft, non-distended. BS +, Obese Musculoskeletal: No peripheral edema noted.  LABS:  CBC  Recent Labs Lab 07/17/13 0350 07/18/13 0552 07/18/13 0931 07/19/13 0909  WBC 11.0* 9.1  --   --   HGB 9.1* 8.6* 10.2* 10.2*  HCT 30.4* 29.1*  30.0* 30.0*  PLT 154 161  --   --    Coag's No results found for this basename: APTT, INR,  in the last 168 hours BMET  Recent Labs Lab 07/15/13 0330  07/17/13 0350 07/18/13 0552 07/18/13 0931 07/19/13 0909  NA 142  < > 140 142 137 140  K 3.8  < > 3.6* 3.6* 3.0* 3.0*  CL 101  < > 93* 96 92* 96  CO2 27  --  31 34*  --   --   BUN 23  < > 28* 27* 25* 22  CREATININE 0.67  < > 0.80 0.71 0.90 0.90  GLUCOSE 186*  < > 136* 110* 174* 138*  < > = values in this interval not displayed. Electrolytes  Recent Labs Lab 07/15/13 0330 07/17/13 0350 07/18/13 0552  CALCIUM 8.5 9.2 9.2  MG 2.0  --   --   PHOS 3.4  --   --     Sepsis Markers No results found for this basename:  LATICACIDVEN, PROCALCITON, O2SATVEN,  in the last 168 hours ABG No results found for this basename: PHART, PCO2ART, PO2ART,  in the last 168 hours Liver Enzymes No results found for this basename: AST, ALT, ALKPHOS, BILITOT, ALBUMIN,  in the last 168 hours Cardiac Enzymes No results found for this basename: TROPONINI, PROBNP,  in the last 168 hours Glucose  Recent Labs Lab 07/18/13 2133 07/19/13 0718 07/19/13 1151 07/19/13 1644 07/19/13 2116 07/20/13 0748  GLUCAP 180* 104* 136* 194* 210* 105*    Imaging No results found. 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 Shane Anderson at [facility] after discharge (per Dr. Anderson we need to call on day of discharge to arrange f/u for next day)  RENAL:  Lab Results  Component Value Date   CREATININE 0.90 07/19/2013   CREATININE 0.90 07/18/2013   CREATININE 0.71 07/18/2013   Protein and Hb in U/A on admission. 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      Michael Wert, MD Pulmonary and Critical Care Medicine [facility] Cell [phone] After 5:30 PM or weekends, call [phone]     

## 2013-07-20 NOTE — Clinical Social Work Placement (Signed)
Clinical Social Work Department CLINICAL SOCIAL WORK PLACEMENT NOTE 07/20/2013  Patient:  [name], [name]  Account Number:  [id number] Admit date:  06/23/2013  Clinical Social Worker:  Carrington Clamp, LCSWA  Date/time:  07/20/2013 05:47 PM  Clinical Social Work is seeking post-discharge placement for this patient at the following level of care:   [place]   (*CSW will update this form in Epic as items are completed)   07/20/2013  Patient/family provided with [place] Department of Clinical Social Work's list of facilities offering this level of care within the geographic area requested by the patient (or if unable, by the patient's family).  07/20/2013  Patient/family informed of their freedom to choose among providers that offer the needed level of care, that participate in Medicare, Medicaid or managed care program needed by the patient, have an available bed and are willing to accept the patient.  07/20/2013  Patient/family informed of [facility]' ownership interest in [facility], as well as of the fact that they are under no obligation to receive care at this facility.  PASARR submitted to EDS on 07/20/2013 PASARR number received on   FL2 transmitted to all facilities in geographic area requested by pt/family on  07/20/2013 FL2 transmitted to all facilities within larger geographic area on   Patient informed that his/her managed care company has contracts with or will negotiate with  certain facilities, including the following:     Patient/family informed of bed offers received:   Patient chooses bed at  Physician recommends and patient chooses bed at    Patient to be transferred to  on   Patient to be transferred to facility by  Patient and family notified of transfer on  Name of family member notified:    The following physician request were entered in Epic:   Additional Comments:  [place], [place] Weekend Clinical  Social Worker [phone]

## 2013-07-21 ENCOUNTER — Encounter (HOSPITAL_COMMUNITY): Payer: Self-pay | Admitting: Pulmonary Disease

## 2013-07-21 ENCOUNTER — Inpatient Hospital Stay (HOSPITAL_COMMUNITY): Payer: 59

## 2013-07-21 DIAGNOSIS — E876 Hypokalemia: Secondary | ICD-10-CM

## 2013-07-21 DIAGNOSIS — D500 Iron deficiency anemia secondary to blood loss (chronic): Secondary | ICD-10-CM

## 2013-07-21 LAB — BASIC METABOLIC PANEL
ANION GAP: 14 (ref 5–15)
BUN: 22 mg/dL (ref 6–23)
CHLORIDE: 95 meq/L — AB (ref 96–112)
CO2: 31 meq/L (ref 19–32)
Calcium: 8.8 mg/dL (ref 8.4–10.5)
Creatinine, Ser: 0.73 mg/dL (ref 0.50–1.10)
GFR calc non Af Amer: >90 mL/min (ref >90)
Glucose, Bld: 148 mg/dL — ABNORMAL HIGH (ref 70–99)
POTASSIUM: 3.3 meq/L — AB (ref 3.7–5.3)
Sodium: 140 mEq/L (ref 137–147)

## 2013-07-21 LAB — CBC
HCT: 28.5 % — ABNORMAL LOW (ref 36.0–46.0)
HEMOGLOBIN: 8.5 g/dL — AB (ref 12.0–15.0)
MCH: 26.2 pg (ref 26.0–34.0)
MCHC: 29.8 g/dL — ABNORMAL LOW (ref 30.0–36.0)
MCV: 88 fL (ref 78.0–100.0)
Platelets: 176 10*3/uL (ref 150–400)
RBC: 3.24 MIL/uL — ABNORMAL LOW (ref 3.87–5.11)
RDW: 20 % — ABNORMAL HIGH (ref 11.5–15.5)
WBC: 10.5 10*3/uL (ref 4.0–10.5)

## 2013-07-21 LAB — GLUCOSE, CAPILLARY
GLUCOSE-CAPILLARY: 122 mg/dL — AB (ref 70–99)
GLUCOSE-CAPILLARY: 183 mg/dL — AB (ref 70–99)
Glucose-Capillary: 187 mg/dL — ABNORMAL HIGH (ref 70–99)
Glucose-Capillary: 213 mg/dL — ABNORMAL HIGH (ref 70–99)

## 2013-07-21 MED ORDER — PREDNISONE 20 MG PO TABS: 40.0000 mg | ORAL_TABLET | Freq: Every day | ORAL | Status: DC

## 2013-07-21 MED ORDER — FERROUS SULFATE 325 (65 FE) MG PO TABS: 325.0000 mg | ORAL_TABLET | Freq: Two times a day (BID) | ORAL | Status: DC

## 2013-07-21 MED ORDER — INSULIN REGULAR HUMAN 100 UNIT/ML IJ SOLN: INTRAMUSCULAR | Status: DC

## 2013-07-21 MED ORDER — FOLIC ACID 1 MG PO TABS: 5.0000 mg | ORAL_TABLET | Freq: Every day | ORAL | Status: DC

## 2013-07-21 MED ORDER — PANTOPRAZOLE SODIUM 40 MG PO TBEC: 40.0000 mg | DELAYED_RELEASE_TABLET | Freq: Every day | ORAL | Status: DC

## 2013-07-21 MED ORDER — FERROUS SULFATE 325 (65 FE) MG PO TABS
325.0000 mg | ORAL_TABLET | Freq: Two times a day (BID) | ORAL | Status: DC
  Administered 2013-07-21 – 2013-07-22 (×3): 325 mg via ORAL
  Filled 2013-07-21 (×5): qty 1

## 2013-07-21 MED ORDER — ATOVAQUONE 750 MG/5ML PO SUSP: 1500.0000 mg | Freq: Every day | ORAL | Status: DC

## 2013-07-21 MED ORDER — HYDROCODONE-ACETAMINOPHEN 5-325 MG PO TABS: 1.0000 | ORAL_TABLET | ORAL | Status: DC | PRN

## 2013-07-21 MED ORDER — HYDROCOD POLST-CHLORPHEN POLST 10-8 MG/5ML PO LQCR: 5.0000 mL | Freq: Two times a day (BID) | ORAL | Status: DC | PRN

## 2013-07-21 MED ORDER — ALPRAZOLAM 1 MG PO TABS: 1.0000 mg | ORAL_TABLET | Freq: Three times a day (TID) | ORAL | Status: DC | PRN

## 2013-07-21 MED ORDER — INSULIN REGULAR HUMAN 100 UNIT/ML IJ SOLN: 3.0000 [IU] | Freq: Three times a day (TID) | INTRAMUSCULAR | Status: DC

## 2013-07-21 MED ORDER — INSULIN ASPART 100 UNIT/ML ~~LOC~~ SOLN
3.0000 [IU] | Freq: Three times a day (TID) | SUBCUTANEOUS | Status: DC
  Administered 2013-07-21 (×2): 3 [IU] via SUBCUTANEOUS

## 2013-07-21 MED ORDER — POTASSIUM CHLORIDE CRYS ER 20 MEQ PO TBCR
40.0000 meq | EXTENDED_RELEASE_TABLET | Freq: Two times a day (BID) | ORAL | Status: AC
  Administered 2013-07-21 (×2): 40 meq via ORAL
  Filled 2013-07-21: qty 2

## 2013-07-21 MED ORDER — QUETIAPINE FUMARATE 100 MG PO TABS: 100.0000 mg | ORAL_TABLET | Freq: Every day | ORAL | Status: DC

## 2013-07-21 NOTE — Progress Notes (Addendum)
Patient has accepted a SNF bed at [facility]. CSW spoke to patient, her sister Olin Hauser and husband Jenny Reichmann.  She agrees to short term stay only. CSW received call from Admissions at [facility]. Must have the d/c summary by 3 pm or they will not be able to accept until tomorrow. MD notified.  DC summary is currently in progress.  Lorie Phenix. Pauline Good, [place]

## 2013-07-21 NOTE — Progress Notes (Signed)
Physical Therapy Treatment Patient Details Name: [name] MRN: [id number] DOB: 1960/04/17 Today's Date: 07/21/2013    History of Present Illness 53 yo female admited 06/23/13 withShortness of breath, hemoptysis, diffuse alveolar hemorrhage . Pt has  been in respiratory distress. Pt highly anxious.      PT Comments    Very anxious and requires near constant cues for focused breathing; able to progress amb today; Continue to recommend SNF for post-acute therapy needs to maximize independence and safety with mobility    Follow Up Recommendations  SNF     Equipment Recommendations  Rolling walker with 5" wheels    Recommendations for Other Services       Precautions / Restrictions Precautions Precautions: Fall Precaution Comments: chemo, monitor sats, plasmaphoresis, poor safety due to anxiety    Mobility  Bed Mobility Overal bed mobility: Needs Assistance Bed Mobility: Supine to Sit     Supine to sit: Supervision     General bed mobility comments: HOB elevated, pt pulled up using bedrails; supervision mostly fo rmanagement of lines and leads  Transfers Overall transfer level: Needs assistance Equipment used: Rolling walker (2 wheeled) Transfers: Sit to/from Stand Sit to Stand: Min assist;Mod assist         General transfer comment: Min assist to stand form elevated bed; cues for safety and hand placement (though pt did not follow suggestions for hand placement); Required heavy mod assist to stand from toilet with notedquite heavy dependence on momentum  Ambulation/Gait Ambulation/Gait assistance: +2 safety/equipment;Min assist Ambulation Distance (Feet): 25 Feet Assistive device: Rolling walker (2 wheeled) Gait Pattern/deviations: Step-through pattern;Decreased stride length     General Gait Details: Near constant cues to self-monitor for activity tolerance, decr talking, focus on breathing   Stairs            Wheelchair Mobility    Modified  [place] (Stroke Patients Only)       Balance     Sitting balance-Leahy Scale: Good       Standing balance-Leahy Scale: Fair                      Cognition Arousal/Alertness: Awake/alert Behavior During Therapy: Restless;Anxious Overall Cognitive Status: Impaired/Different from baseline Area of Impairment: Safety/judgement;Problem solving;Attention   Current Attention Level:  (EXTREMELY distracible)     Safety/Judgement: Decreased awareness of safety;Decreased awareness of deficits   Problem Solving: Requires verbal cues;Difficulty sequencing General Comments: Pt. highly anxious and lacks focus.  Needs frequent redirection for task completion.      Exercises      General Comments        Pertinent Vitals/Pain O2 sats decr with amb on 3 L O2 to 84% observed lowest Rebounds pretty quickly with seated rest    Home Living                      Prior Function            PT Goals (current goals can now be found in the care plan section) Acute Rehab PT Goals Patient Stated Goal: wants to walk to be able to go home PT Goal Formulation: With patient Time For Goal Achievement: 08/01/13 Potential to Achieve Goals: Good Progress towards PT goals: Progressing toward goals    Frequency  Min 3X/week    PT Plan Current plan remains appropriate    Co-evaluation             End of Session Equipment Utilized During Treatment:  Gait belt;Oxygen Activity Tolerance: Patient tolerated treatment well Patient left: with call bell/phone within reach;in chair     Time: [phone] (with approx 10 minutes on commode) PT Time Calculation (min): 60 min  Charges:  $Gait Training: 23-37 mins $Therapeutic Activity: 8-22 mins                    G Codes:      Quin Hoop 07/21/2013, 1:42 PM  Roney Marion, [place] Pager [phone] Office [phone]

## 2013-07-21 NOTE — Progress Notes (Signed)
Inpatient Diabetes Program Recommendations  AACE/ADA: New Consensus Statement on Inpatient Glycemic Control (2013)  Target Ranges:  Prepandial:   less than 140 mg/dL      Peak postprandial:   less than 180 mg/dL (1-2 hours)      Critically ill patients:  140 - 180 mg/dL     Results for GENEL, KUCERA (MRN [id number]) as of 07/21/2013 10:43  Ref. Range 07/20/2013 07:48 07/20/2013 12:21 07/20/2013 16:41 07/20/2013 22:08  Glucose-Capillary Latest Range: 70-99 mg/dL 105 (H) 212 (H) 245 (H) 209 (H)     Patient getting Prednisone 40 mg daily. Having postprandial glucose excursions.   PO intake 100% of meals.   MD- Please add Novolog Meal Coverage- Novolog 3 units tid with meals   Will follow Wyn Quaker RN, MSN, CDE Diabetes Coordinator Inpatient Diabetes Program Team Pager: [phone] (8a-10p)

## 2013-07-21 NOTE — Care Management Note (Signed)
CARE MANAGEMENT NOTE 07/21/2013  Patient:  [name], [name]   Account Number:  [id number]  Date Initiated:  06/24/2013  Documentation initiated by:  DAVIS,RHONDA  Subjective/Objective Assessment:   pt with resp distress intubated on [id number] extubate to 40%fi02 on [id number]     Action/Plan:   TBD based on progress and progression of resp status.  07/21/2013 Pt now wishes to d/c to SNF for short term rehab. CSW working with pt.   Anticipated DC Date:  07/22/2013   Anticipated DC Plan:  SKILLED NURSING FACILITY  In-house referral  NA      DC Planning Services  NA      [facility] Choice  NA   Choice offered to / List presented to:  NA   DME arranged  NA      DME agency  NA     [place] arranged  NA      [place] agency  NA   Status of service:  In process, will continue to follow Medicare Important Message given?   (If response is "NO", the following Medicare IM given date fields will be blank) Date Medicare IM given:   Medicare IM given by:   Date Additional Medicare IM given:   Additional Medicare IM given by:    Discharge Disposition:    Per UR Regulation:  Reviewed for med. necessity/level of care/duration of stay  If discussed at [place] of Stay Meetings, dates discussed:   07/01/2013  07/03/2013  07/08/2013  07/10/2013  07/15/2013  07/17/2013    Comments:  Contact:  Thomas,[name] Sister [place]                 [place],John Spouse [phone]   07-08-13 [place], RNBSN - [id number] Tx from [place] for plasmaphoresis today.  [place], RN, BSN, CCM [phone] Chart Reviewed for discharge and hospital needs. Discharge needs at time of review: None present will follow for needs. Review of patient progress due on [id number].   [id number] Rosana Hoes RN, BSN, [place] [phone] Chart Reviewed for discharge and hospital needs. Discharge needs at time of review: None present will follow for needs. Review of patient progress due  on [id number].   [place], RN, BSN, [place] [phone] Chart Reviewed for discharge and hospital needs. Discharge needs at time of review: None present will follow for needs. Review of patient progress due on [id number].  [id number] Rosana Hoes, RN, BSN, CCM: CHART REVIEWED AND UPDATED.  Next chart review due on [id number]. NO DISCHARGE NEEDS PRESENT AT THIS TIME WILL CONTINUE TO FOLLOW.  o2 at 6l/min,iv steroids and bronchodilators. CASE MANAGEMENT [phone]   [id number] Eldridge Dace, BSN, [place] [phone] Chart Reviewed for discharge and hospital needs. Discharge needs at time of review: None present will follow for needs. Review of patient progress due on [id number].

## 2013-07-21 NOTE — Progress Notes (Signed)
Chaplain was making rounds and stopped in to see Mrs. Medlen. She and her husband were present. No Chaplain support needed, she was happy to be discharged.  [name]

## 2013-07-21 NOTE — Progress Notes (Signed)
PULMONARY / CRITICAL CARE MEDICINE   Name: [name] MRN: [id number] DOB: 18-Aug-1960    ADMISSION DATE:  06/23/2013 CONSULTATION DATE:  06/23/2013  REFERRING MD: Doyle Askew   CHIEF COMPLAINT: Shortness of breath, hemoptysis, diffuse alveolar hemorrhage   BRIEF PATIENT DESCRIPTION:  53 year old female former smoker presented with cough, hemoptysis for 2 days.  Found to have hypoxia and diffuse pulmonary infiltrates.  Serology showed P ANCA 1:640, Myeloperoxidase > 8 .  SIGNIFICANT EVENTS: 6/15 Admit, PCCM consulted  6/16 Start high dose steroids, Transfuse PRBC 6/17 Start weekly Rituxan  6/18 Hematology consulted to assess for plasma exchange >> not indicated at this time 6/21 Transition to prednisone 6/23 Second dose of  weekly rituxan  6/29 Patient continues to need NRB 6/30 Started plasma exchange, weaned to [facility] 7/02 Neg 5 liters, plasma xch #2, same O2 needs 30 liters 7/04 Plasma exchange tx #3 7/06 Plasma exchange tx #4, pt on 5L [place] for 2 days, sats stable 7/7- sob resolving even with exertion 7/7 tx to floor 7/8 Plasma exchange  #5/7 7/10 plasma exchange #6/7 7/10 Plasma exchange # 7/7  STUDIES: 6/15 CT chest >> diffuse b/l GGO 6/15 P ANCA 1:640, Myeloperoxidase > 8, HIV non reactive, Aldolase 4, CPK 31, Scl 70 < 1, anti GBM < 1, CCP < 2, RF < 10, ds DNA < 1, ANA negative, ESR 90, CRP 22, SSA < 1, SSB < 1 6/15 U/A >> 100 protein, large Hgb, moderate leukocytes 6/16 Echo >> EF 55 to 60% 6/16 Lupus anticoagulant dectected 6/17 Hep B IgM negative, Hep B core Ab negative, Hep B surface Ag negative, Hep B surface Ab negative, Hep C Ab negative 6/17 G6PD >> 22.8  LINES / TUBES:  Rt PICC 6/17 >>> HD cath 6/29 >>>7/13  CULTURES:  Respiratory viral panel 6/15 >> negative Legionella 6/15 >> negative Blood 6/15 >> neg  ANTIBIOTICS:  Levaquin 6/15 >>> 6/17 Ceftriaxone 6/15 >>> one dose 6/15  Azithromycin 6/15 >>> one dose 6/15 Atovaquone 6/21 >>>   SUBJECTIVE:   Improved, agreeable to snf placement   VITAL SIGNS: Temp:  [97.2 F (36.2 C)-98.2 F (36.8 C)] 98.2 F (36.8 C) (07/13 0554) Pulse Rate:  [85-92] 92 (07/13 0554) Resp:  [18] 18 (07/13 0554) BP: (134-161)/(77-80) 134/79 mmHg (07/13 0554) SpO2:  [96 %-98 %] 96 % (07/13 0554) Weight:  [143.1 kg (315 lb 7.7 oz)] 143.1 kg (315 lb 7.7 oz) (07/13 0310) FI02  :  3 lpm NP  INTAKE / OUTPUT: Intake/Output     07/12 0701 - 07/13 0700 07/13 0701 - 07/14 0700   P.O. 1800 240   I.V. (mL/kg)     Total Intake(mL/kg) 1800 (12.6) 240 (1.7)   Urine (mL/kg/hr) 2600 (0.8)    Total Output 2600     Net -800 +240         PHYSICAL EXAMINATION: General: Obese white female, NAD.   Neuro: awake, alert, following commands, less anxious HEENT: PERRL, NCAT, EOMi Cardiovascular: RRR, no m/g/r. PICC line and HD cath in place. Lungs: diminished in bases  Abdomen: Soft, non-distended. BS +, Obese Musculoskeletal: No peripheral edema noted.  LABS:  CBC  Recent Labs Lab 07/17/13 0350 07/18/13 0552 07/18/13 0931 07/19/13 0909 07/21/13 0500  WBC 11.0* 9.1  --   --  10.5  HGB 9.1* 8.6* 10.2* 10.2* 8.5*  HCT 30.4* 29.1* 30.0* 30.0* 28.5*  PLT 154 161  --   --  176   Coag's No results found for this basename:  APTT, INR,  in the last 168 hours BMET  Recent Labs Lab 07/17/13 0350 07/18/13 0552 07/18/13 0931 07/19/13 0909 07/21/13 0500  NA 140 142 137 140 140  K 3.6* 3.6* 3.0* 3.0* 3.3*  CL 93* 96 92* 96 95*  CO2 31 34*  --   --  31  BUN 28* 27* 25* 22 22  CREATININE 0.80 0.71 0.90 0.90 0.73  GLUCOSE 136* 110* 174* 138* 148*   Electrolytes  Recent Labs Lab 07/15/13 0330 07/17/13 0350 07/18/13 0552 07/21/13 0500  CALCIUM 8.5 9.2 9.2 8.8  MG 2.0  --   --   --   PHOS 3.4  --   --   --     Sepsis Markers No results found for this basename: LATICACIDVEN, PROCALCITON, O2SATVEN,  in the last 168 hours ABG No results found for this basename: PHART, PCO2ART, PO2ART,  in the last 168  hours Liver Enzymes No results found for this basename: AST, ALT, ALKPHOS, BILITOT, ALBUMIN,  in the last 168 hours Cardiac Enzymes No results found for this basename: TROPONINI, PROBNP,  in the last 168 hours Glucose  Recent Labs Lab 07/19/13 1644 07/19/13 2116 07/20/13 0748 07/20/13 1221 07/20/13 1641 07/20/13 2208  GLUCAP 194* 210* 105* 212* 245* 209*    Imaging  CXR: 7/13 Slight interval improvement in pulm interstitium c/w resolving interstitial edema  ASSESSMENT / PLAN:   RESPIRATORY:  Acute hypoxic respiratory failure due to [facility] from Microscopic polyangiitis > improving Chronic Cough > likely GERD, post nasal drip related Plan: - Wean O2 as able, may need low dose O2 at discharge - tussionex at night - maintain prednisone 69m daily through discharge - Plasmapheresis session #7 (final) copmpleted 7/11 -d/c HD catheter - Ct atovaquone 6/21 for PCP prophylaxis (sulfa allergy)\ - [facility] (per Dr. AOuida Sillswe need to call on day of discharge to arrange f/u for next day)  RENAL:  Lab Results  Component Value Date   CREATININE 0.73 07/21/2013   CREATININE 0.90 07/19/2013   CREATININE 0.90 07/18/2013   Protein and Hb in U/A on admission. 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 Pt in agreement. Sister in room when I visited this am.     PaMariel Sleeteeper  [phone]  [phone]  If no response or cell goes to voicemail, call beeper [phone] AM 07/21/2013

## 2013-07-21 NOTE — Discharge Summary (Signed)
Physician Discharge Summary  Patient ID: [name] MRN: [id number] DOB/AGE: [birth year] 53 y.o.  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    GERD Constipation   Discharge Plan: Cont protonix  Daily miralax  PCP f/u   [facility] Plan: Cont iron supplement  F/u cbc - high risk pancytopenia  Continue Atovaquone for PCP prophylaxis  outpt f/u - PCP, rheum  Steroid Induced Hyperglycemia   Discharge Plan: Cont SSI and novolog meal coverage  Carb modified diet  As steroids are reduced, monitor glucose for insulin needs  Anxiety  Severe Deconditioning   Discharge Plan: SNF placement for short term rehab  Continue bupropion, celexa, seroquel, xanax                   DISCHARGE SUMMARY   [name] GUIRGUISis a 53y.o. y/o female, former smoker,  with a PMH of morbid obesity, GERD, Diverticulitis of large intestine with perforation, anxiety & depression [facility] onset short[facility] of [facility] & cough with associated hemoptysis  (approximately teaspoons at at time for 2 days).  On arrival, she was noted to be hypoxic with saturations in the 70's.  Hypoxemia improved with 4L per [place] O2.  CT of the chest was evaluated and noted diffuse bilateral airspace opacities.  She was admitted per [facility] for acute hypoxemic respiratory failure.  Pulmonary & Critical Care Medicine was consulted for evaluation.  Serology showed a P ANCA of 1:640, Myeloperoxidase greater than 8.  [facility]. It was felt that she would not tolerate lung bx given high O2 demands and she was treated with empiric abx, high dose steroids, weekly rituxan and plasmapheresis x 7 with slow improvement. 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 She continues on atovaquone for PCP prophylaxis. Bacterial / Viral cultures to date are negative.  Course was complicated by anemia in setting alveolar hemorrhage, now improved on iron supplementation. She needs close outpt f/u with rheumatology (d/w with Dr. SCeline Manswho should see her the day after d/c as below). She remains very deconditioned and [facility]             SIGNIFICANT EVENTS:  6/15 - Admit, PCCM consulted  6/15 - P ANCA 1:640, Myeloperoxidase > 8  6/16 - Start high dose steroids, Transfuse PRBC  6/17 - Start weekly Rituxan  6/18 - Hematology consulted to assess for plasma exchange >> not indicated at this time  6/21 - Transition to prednisone  6/23 - Second dose of weekly rituxan  6/29 - Patient continues to need NRB  6/30 - Started plasma exchange, weaned to [facility]  7/02 - Neg 5 liters, plasma xch #2, same O2 needs 30 liters  7/04 - Plasma exchange tx #3  7/06 -Plasma exchange tx #4, pt on 5L [place] for 2 days, sats stable  7/07- sob resolving even with exertion  7/07 - tx to floor  7/08 - Plasma exchange #5/7   7/10 - plasma exchange #6/7  7/10 - Plasma exchange # 7/7   STUDIES:  6/15 - CT chest >> diffuse b/l GGO  6/15 - P ANCA 1:640, Myeloperoxidase > 8, HIV non reactive, Aldolase 4, CPK 31, Scl 70 < 1, anti GBM < 1, CCP < 2, RF < 10, ds DNA < 1, ANA negative, ESR 90, CRP 22, SSA < 1, SSB < 1  6/15 -  U/A >> 100 protein, large Hgb, moderate leukocytes  6/16 - Echo >> EF 55 to 60%  6/16 - Lupus anticoagulant dectected  6/17-  Hep B IgM negative, Hep B core Ab negative, Hep B surface Ag negative, Hep B surface Ab negative, Hep C Ab negative  6/17 - G6PD >> 22.8   LINES / TUBES:  Rt PICC 6/17 >>> 7/13 HD cath 6/29 >>>7/13   CULTURES:  Respiratory viral panel 6/15 >> negative  Legionella 6/15 >> negative  Blood 6/15 >> neg   ANTIBIOTICS:  Levaquin 6/15 >>> 6/17  Ceftriaxone 6/15 >>> one dose 6/15  Azithromycin 6/15 >>> one dose 6/15  Atovaquone 6/21 >>>     Discharge Exam: General: Obese white female, NAD.  Neuro: awake, alert, following commands, less anxious  HEENT: PERRL, NCAT, EOMi  Cardiovascular: RRR, no m/g/r.  Lungs: diminished in bases  Abdomen: Soft, non-distended. BS +, Obese  Musculoskeletal: No peripheral edema noted.   Filed Vitals:   07/21/13 1800 07/21/13 2121 07/22/13 0500 07/22/13 0605  BP: 128/76 139/82  116/50  Pulse: 98 116  100  Temp: 97.6 F (36.4 C) 98 F (36.7 C)  98.8 F (37.1 C)  TempSrc: Oral Oral  Oral  Resp: '21 20  18  ' Height:      Weight:  311 lb 1.1 oz (141.1 kg) 311 lb 4.6 oz (141.2 kg)   SpO2: 95% 94%  96%     Discharge Labs  BMET  Recent Labs Lab 07/17/13 0350 07/18/13 0552 07/18/13 0931 07/19/13 0909 07/21/13 0500 07/22/13 0500  NA 140 142 137 140 140 142  K 3.6* 3.6* 3.0* 3.0* 3.3* 3.8  CL 93* 96 92* 96 95* 99  CO2 31 34*  --   --  31 30  GLUCOSE 136* 110* 174* 138* 148* 137*  BUN 28* 27* 25* '22 22 23  ' CREATININE 0.80 0.71 0.90 0.90 0.73 0.82  CALCIUM 9.2 9.2  --   --  8.8 8.9    CBC  Recent Labs Lab  07/18/13 0552  07/19/13 0909 07/21/13 0500 07/22/13 0500  HGB 8.6*  < > 10.2* 8.5* 7.8*  HCT 29.1*  < > 30.0* 28.5* 26.6*  WBC 9.1  --   --  10.5 11.6*  PLT 161  --   --  176 185  < > = values in this interval not displayed.   Discharge Instructions   Call MD for:  difficulty breathing, headache or visual disturbances    Complete by:  As directed      Call MD for:  extreme fatigue    Complete by:  As directed      Call MD for:  hives    Complete by:  As directed      Call MD for:  persistant dizziness or light-headedness    Complete by:  As directed      Call MD for:  persistant nausea and vomiting    Complete by:  As directed      Call MD for:  redness, tenderness, or signs of infection (pain, swelling, redness, odor or green/yellow discharge around incision site)    Complete by:  As directed   Assess site of old HD catheter (removed prior to discharge).     Call MD for:  severe uncontrolled pain    Complete by:  As directed      Call MD for:  temperature >100.4    Complete by:  As directed      Diet - low sodium heart healthy    Complete by:  As directed      Diet Carb Modified    Complete by:  As directed      Discharge instructions    Complete by:  As directed   1. Stay on 40 mg prednisone until seen by Rheumatology on 7/15 2. Review your medications carefully as they have changed.  3. Repeat BMP & CBC on 7/15     Increase activity slowly    Complete by:  As directed                 Follow-up Information   Follow up with PARRETT,TAMMY, NP On 08/05/2013. (9:30am - with Dr. Golden Pop Nurse Practitioner )    Specialty:  Nurse Practitioner   Contact information:   [address] [place] [address] [phone]       Follow up with Tobie Lords, MD On 07/23/2013. (Appt at 1:00 PM.  Please arrive 12:15)    Specialty:  Rheumatology   Contact information:   [address], [place] [address] [phone]       Follow up with  [name], MD. (As needed)    Specialty:  Family Medicine   Contact information:   [address] [place] [address] [phone]          Medication List    STOP taking these medications       phentermine 37.5 MG capsule      TAKE these medications       ALPRAZolam 1 MG tablet  Commonly known as:  XANAX  Take 1 tablet (1 mg total) by mouth 3 (three) times daily as needed for anxiety.     atovaquone 750 MG/5ML suspension  Commonly known as:  MEPRON  Take 10 mLs (1,500 mg total) by mouth daily with breakfast.     buPROPion 300 MG 24 hr tablet  Commonly known as:  WELLBUTRIN XL  Take 300 mg by mouth daily.     chlorpheniramine-HYDROcodone 10-8 MG/5ML Lqcr  Commonly known as:  TUSSIONEX  Take 5 mLs by mouth every 12 (twelve) hours as needed for cough.     citalopram 40 MG tablet  Commonly known as:  CELEXA  Take 40 mg by mouth daily.  ferrous sulfate 325 (65 FE) MG tablet  Take 1 tablet (325 mg total) by mouth 2 (two) times daily with a meal.     folic acid 1 MG tablet  Commonly known as:  FOLVITE  Take 5 tablets (5 mg total) by mouth daily.     HYDROcodone-acetaminophen 5-325 MG per tablet  Commonly known as:  NORCO/VICODIN  Take 1 tablet by mouth every 4 (four) hours as needed for moderate pain.     insulin regular 100 units/mL injection  Commonly known as:  NOVOLIN R RELION  Inject 0.03 mLs (3 Units total) into the skin 3 (three) times daily before meals.     insulin regular 100 units/mL injection  Commonly known as:  NOVOLIN R RELION  - Correction coverage: Resistant (obese, steroids)  - CBG < 70: implement hypoglycemia protocol - orange juice, glucose tabs or dextrose  -   - CBG 70 - 120: 0 units  - CBG 121 - 150: 3 units  - CBG 151 - 200: 4 units  - CBG 201 - 250: 7 units  - CBG 251 - 300: 11 units  - CBG 301 - 350: 15 units  - CBG 351 - 400: 20 units  - CBG > 400: call MD     pantoprazole 40 MG tablet  Commonly known  as:  PROTONIX  Take 1 tablet (40 mg total) by mouth at bedtime.     polyethylene glycol packet  Commonly known as:  MIRALAX / GLYCOLAX  Take 17 g by mouth 2 (two) times daily as needed (constipation).     predniSONE 20 MG tablet  Commonly known as:  DELTASONE  Take 2 tablets (40 mg total) by mouth daily with breakfast. Remain on 40 mg QD until seen by Rheumatology.     QUEtiapine 100 MG tablet  Commonly known as:  SEROQUEL  Take 1 tablet (100 mg total) by mouth at bedtime.         Disposition: SNF for rehab.  PT recommends rolling walker with 5 " wheels.    Discharged Condition: [name] has met maximum benefit of inpatient care and is medically stable and cleared for discharge.  Patient is pending follow up as above.      Time spent on disposition:  Greater than 35 minutes.   Signed: Noe Gens, NP-C [facility] Pgr: [phone] Office: [phone]

## 2013-07-21 NOTE — Progress Notes (Addendum)
Patient's DC delayed today as CSW was unable to secure a PASARR number.  [facility] MUST requested additional information due to patient's psych history; this information was provided but has not been fully processed. Unable to send patient to facility without the PASARR number. Will notify MD, patient and nursing.CSW discussed with Admissions at [facility].   Plan d/c in a.m once PASARR number is in place.  MD: please update d/c summary in a.m to reflect change in d/c date.  Sorry for the d/c delay.  Many thanks! Lorie Phenix. Pauline Good, [place]

## 2013-07-21 NOTE — Clinical Social Work Placement (Addendum)
     Clinical Social Work Department CLINICAL SOCIAL WORK PLACEMENT NOTE 07/22/2013  Patient:  [name], [name]  Account Number:  [id number] Admit date:  06/23/2013  Clinical Social Worker:  Carrington Clamp, LCSWA  Date/time:  07/20/2013 05:47 PM  Clinical Social Work is seeking post-discharge placement for this patient at the following level of care:   [place]   (*CSW will update this form in Epic as items are completed)   07/20/2013  Patient/family provided with [place] Department of Clinical Social Works list of facilities offering this level of care within the geographic area requested by the patient (or if unable, by the patients family).  07/20/2013  Patient/family informed of their freedom to choose among providers that offer the needed level of care, that participate in Medicare, Medicaid or managed care program needed by the patient, have an available bed and are willing to accept the patient.  07/20/2013  Patient/family informed of [facility] ownership interest in [facility], as well as of the fact that they are under no obligation to receive care at this facility.  PASARR submitted to EDS on 07/20/2013 PASARR number received on   FL2 transmitted to all facilities in geographic area requested by pt/family on  07/20/2013 FL2 transmitted to all facilities within larger geographic area on   Patient informed that his/her managed care company has contracts with or will negotiate with  certain facilities, including the following:   [facility]     Patient/family informed of bed offers received:  07/21/2013 Patient chooses bed at [facility] Physician recommends and patient chooses bed at    Patient to be transferred to [place] on  07/22/2013 Patient to be transferred to facility by [facility]) Patient and family notified of transfer on 07/22/2013 Name of family member notified:   Sister Olin Hauser and husband Jenny Reichmann  The following physician request were entered in Epic: Physician Request  Please sign FL2.  Please prepare priority discharge summary and prescriptions.    Additional Comments: 07/21/13  Per MD- Patient is medically stable for d/c today to SNF. CSW prepared patient for d/c but PASARR number was not in place.  Was unable to secure the PASARR number prior to 5 pm SNF deadline.  Plan d/c in the a.m. to SNF. Nursing and patient/husband notified. Attempted to reach MD X's 3; nursing will attempt to notify.  Lorie Phenix. Murrell Redden [id number] 07/22/13  OK per MD for d/c today. PASARR number in place; notified [facility] and ok for patient to come to facility today. Nursing notified to call report. Patient stated that she was happy to be leaving the hospital today and "wants to get this over with so she can return home."  CSW signing off.

## 2013-07-21 NOTE — Progress Notes (Signed)
R IJ Cath D/C per MD order. Vaseline pressure dsg applied/pressure x57min/no bleeding noted/floor RN aware. Lorri Frederick, RN

## 2013-07-22 ENCOUNTER — Other Ambulatory Visit: Payer: Self-pay | Admitting: *Deleted

## 2013-07-22 DIAGNOSIS — J9600 Acute respiratory failure, unspecified whether with hypoxia or hypercapnia: Secondary | ICD-10-CM

## 2013-07-22 LAB — BASIC METABOLIC PANEL WITH GFR
Anion gap: 13 (ref 5–15)
BUN: 23 mg/dL (ref 6–23)
CO2: 30 meq/L (ref 19–32)
Calcium: 8.9 mg/dL (ref 8.4–10.5)
Chloride: 99 meq/L (ref 96–112)
Creatinine, Ser: 0.82 mg/dL (ref 0.50–1.10)
GFR calc Af Amer: >90 mL/min
GFR calc non Af Amer: 81 mL/min — ABNORMAL LOW
Glucose, Bld: 137 mg/dL — ABNORMAL HIGH (ref 70–99)
Potassium: 3.8 meq/L (ref 3.7–5.3)
Sodium: 142 meq/L (ref 137–147)

## 2013-07-22 LAB — CBC
HCT: 26.6 % — ABNORMAL LOW (ref 36.0–46.0)
Hemoglobin: 7.8 g/dL — ABNORMAL LOW (ref 12.0–15.0)
MCH: 25.7 pg — ABNORMAL LOW (ref 26.0–34.0)
MCHC: 29.3 g/dL — ABNORMAL LOW (ref 30.0–36.0)
MCV: 87.5 fL (ref 78.0–100.0)
Platelets: 185 K/uL (ref 150–400)
RBC: 3.04 MIL/uL — ABNORMAL LOW (ref 3.87–5.11)
RDW: 20.2 % — ABNORMAL HIGH (ref 11.5–15.5)
WBC: 11.6 K/uL — ABNORMAL HIGH (ref 4.0–10.5)

## 2013-07-22 LAB — IRON AND TIBC
Iron: 49 ug/dL (ref 42–135)
Saturation Ratios: 21 % (ref 20–55)
TIBC: 230 ug/dL — ABNORMAL LOW (ref 250–470)
UIBC: 181 ug/dL (ref 125–400)

## 2013-07-22 LAB — GLUCOSE, CAPILLARY: Glucose-Capillary: 100 mg/dL — ABNORMAL HIGH (ref 70–99)

## 2013-07-22 MED ORDER — HYDROCOD POLST-CHLORPHEN POLST 10-8 MG/5ML PO LQCR: 5.0000 mL | Freq: Two times a day (BID) | ORAL | Status: DC | PRN

## 2013-07-22 MED ORDER — ALPRAZOLAM 1 MG PO TABS: 1.0000 mg | ORAL_TABLET | Freq: Three times a day (TID) | ORAL | Status: DC | PRN

## 2013-07-22 MED ORDER — HYDROCODONE-ACETAMINOPHEN 5-325 MG PO TABS: 1.0000 | ORAL_TABLET | ORAL | Status: DC | PRN

## 2013-07-22 NOTE — Progress Notes (Signed)
Report was called to [facility] and pt was transported by ambulance along with all of her belongings and paperwork. Family was at the bedside and left with the patient.

## 2013-07-22 NOTE — Telephone Encounter (Signed)
[facility] 

## 2013-07-22 NOTE — Discharge Summary (Signed)
I agree with this discharge plan [name]

## 2013-07-24 ENCOUNTER — Encounter: Payer: Self-pay | Admitting: Internal Medicine

## 2013-07-24 ENCOUNTER — Non-Acute Institutional Stay (SKILLED_NURSING_FACILITY): Payer: 59 | Admitting: Internal Medicine

## 2013-07-24 DIAGNOSIS — F418 Other specified anxiety disorders: Secondary | ICD-10-CM

## 2013-07-24 DIAGNOSIS — F341 Dysthymic disorder: Secondary | ICD-10-CM

## 2013-07-24 DIAGNOSIS — R739 Hyperglycemia, unspecified: Secondary | ICD-10-CM | POA: Insufficient documentation

## 2013-07-24 DIAGNOSIS — R042 Hemoptysis: Secondary | ICD-10-CM

## 2013-07-24 DIAGNOSIS — J9600 Acute respiratory failure, unspecified whether with hypoxia or hypercapnia: Secondary | ICD-10-CM

## 2013-07-24 DIAGNOSIS — I7782 Antineutrophilic cytoplasmic antibody (ANCA) vasculitis: Secondary | ICD-10-CM

## 2013-07-24 DIAGNOSIS — K219 Gastro-esophageal reflux disease without esophagitis: Secondary | ICD-10-CM | POA: Insufficient documentation

## 2013-07-24 DIAGNOSIS — R7309 Other abnormal glucose: Secondary | ICD-10-CM

## 2013-07-24 DIAGNOSIS — J9601 Acute respiratory failure with hypoxia: Secondary | ICD-10-CM

## 2013-07-24 DIAGNOSIS — R0489 Hemorrhage from other sites in respiratory passages: Secondary | ICD-10-CM

## 2013-07-24 DIAGNOSIS — I776 Arteritis, unspecified: Secondary | ICD-10-CM

## 2013-07-24 DIAGNOSIS — E669 Obesity, unspecified: Secondary | ICD-10-CM

## 2013-07-24 HISTORY — DX: Hyperglycemia, unspecified: R73.9

## 2013-07-24 NOTE — Assessment & Plan Note (Signed)
Cont SSI and novolog meal coverage  Carb modified diet  As steroids are reduced, monitor glucose for insulin needs

## 2013-07-24 NOTE — Assessment & Plan Note (Signed)
Cont iron supplement  F/u cbc - high risk pancytopenia  Continue Atovaquone for PCP prophylaxis  outpt f/u - PCP, rheum

## 2013-07-24 NOTE — Assessment & Plan Note (Signed)
Cont protonix  Daily miralax

## 2013-07-24 NOTE — Assessment & Plan Note (Signed)
Outpatient pulmonary follow up arranged (see below)  Maintain prednisone 40mg  daily until seen by Rheumatology  S/p 7/7 plasmapheresis  Continue atovaquone for PCP prophylaxis (sulfa allergy)  Needs to see rheumatology as outpt ONE DAY post d/c - appt arranged (see below)  Continue Oxygen at 4L per [place] for discharge

## 2013-07-24 NOTE — Assessment & Plan Note (Signed)
Noted;diet and OT/PT

## 2013-07-24 NOTE — Assessment & Plan Note (Signed)
As above; hemorrhage as result of vasculitis

## 2013-07-24 NOTE — Assessment & Plan Note (Signed)
2/2 alveolar hemmorhage

## 2013-07-24 NOTE — Assessment & Plan Note (Signed)
Continue bupropion, celexa, seroquel, xanax

## 2013-07-24 NOTE — Progress Notes (Signed)
MRN: [id number] Name: QUERIDA STUKEY  Sex: female Age: 53 y.o. DOB: 16-Dec-1960  [place] #: [facility] Facility/Room: 115 Level Of Care: SNF Provider: Inocencio Homes D Emergency Contacts: Extended Emergency Contact Information Primary Emergency Contact: Kathyrn Sheriff [place] Phone: [phone] Mobile Phone: [phone] Relation: Sister Secondary Emergency Contact: Arteaga,John Address: [address]          [place] [address] Johnnette Litter of [place] Phone: [phone] Mobile Phone: [phone] Relation: Spouse  Code Status: FULL  Allergies: Sulfa antibiotics  Chief Complaint  Patient presents with  . nursing home admission    HPI: Patient is 53 y.o. female who has generalized weakness after an alveolar hemorrhage 2/2 to vasculitis admitted for OT/PT.  Past Medical History  Diagnosis Date  . Colitis   . Obesity (BMI 30-39.9) 09/23/2012  . Anxiety associated with depression 09/23/2012  . Hx of tobacco use, presenting hazards to health 09/23/2012  . At high risk for falls 09/23/2012    She has had a fractured ankle and tendon tear with falls over the last couple years.  . Diverticulitis of large intestine with perforation 09/23/2012  . GERD (gastroesophageal reflux disease)     Past Surgical History  Procedure Laterality Date  . Appendectomy    . Tonsillectomy    . Bladder repair        Medication List       This list is accurate as of: 07/24/13 11:59 PM.  Always use your most recent med list.               ALPRAZolam 1 MG tablet  Commonly known as:  XANAX  Take 1 tablet (1 mg total) by mouth 3 (three) times daily as needed for anxiety.     atovaquone 750 MG/5ML suspension  Commonly known as:  MEPRON  Take 10 mLs (1,500 mg total) by mouth daily with breakfast.     buPROPion 300 MG 24 hr tablet  Commonly known as:  WELLBUTRIN XL  Take 300 mg by mouth daily.     chlorpheniramine-HYDROcodone 10-8 MG/5ML Lqcr  Commonly known as:   TUSSIONEX  Take 5 mLs by mouth every 12 (twelve) hours as needed for cough.     citalopram 40 MG tablet  Commonly known as:  CELEXA  Take 40 mg by mouth daily.     ferrous sulfate 325 (65 FE) MG tablet  Take 1 tablet (325 mg total) by mouth 2 (two) times daily with a meal.     folic acid 1 MG tablet  Commonly known as:  FOLVITE  Take 5 tablets (5 mg total) by mouth daily.     HYDROcodone-acetaminophen 5-325 MG per tablet  Commonly known as:  NORCO/VICODIN  Take 1 tablet by mouth every 4 (four) hours as needed for moderate pain.     insulin regular 100 units/mL injection  Commonly known as:  NOVOLIN R RELION  Inject 0.03 mLs (3 Units total) into the skin 3 (three) times daily before meals.     insulin regular 100 units/mL injection  Commonly known as:  NOVOLIN R RELION  - Correction coverage: Resistant (obese, steroids)  - CBG < 70: implement hypoglycemia protocol - orange juice, glucose tabs or dextrose  -   - CBG 70 - 120: 0 units  - CBG 121 - 150: 3 units  - CBG 151 - 200: 4 units  - CBG 201 - 250: 7 units  - CBG 251 - 300: 11 units  -  CBG 301 - 350: 15 units  - CBG 351 - 400: 20 units  - CBG > 400: call MD     pantoprazole 40 MG tablet  Commonly known as:  PROTONIX  Take 1 tablet (40 mg total) by mouth at bedtime.     polyethylene glycol packet  Commonly known as:  MIRALAX / GLYCOLAX  Take 17 g by mouth 2 (two) times daily as needed (constipation).     predniSONE 20 MG tablet  Commonly known as:  DELTASONE  Take 2 tablets (40 mg total) by mouth daily with breakfast. Remain on 40 mg QD until seen by Rheumatology.     QUEtiapine 100 MG tablet  Commonly known as:  SEROQUEL  Take 1 tablet (100 mg total) by mouth at bedtime.        No orders of the defined types were placed in this encounter.    There is no immunization history for the selected administration types on file for this patient.  History  Substance Use Topics  . Smoking status: Former  Smoker    Quit date: 01/09/2001  . Smokeless tobacco: Never Used  . Alcohol Use: No    Family history is noncontributory    Review of Systems  DATA OBTAINED: from patient GENERAL: Feels well no fevers, fatigue, appetite changes SKIN: No itching, rash or wounds EYES: No eye pain, redness, discharge EARS: No earache, tinnitus, change in hearing NOSE: No congestion, drainage or bleeding  MOUTH/THROAT: No mouth or tooth pain, No sore throat  RESPIRATORY: No cough, wheezing, SOB CARDIAC: No chest pain, palpitations, lower extremity edema  GI: No abdominal pain, No N/V/D or constipation, No heartburn or reflux  GU: No dysuria, frequency or urgency, or incontinence  MUSCULOSKELETAL: No unrelieved bone/joint pain NEUROLOGIC: No headache, dizziness or focal weakness PSYCHIATRIC: pt had multiple concerns that we DISCUSSED AT LENGTH.   Filed Vitals:   07/27/13 1203  BP: 132/77  Pulse: 113  Temp: 97.8 F (36.6 C)  Resp: 21    Physical Exam  GENERAL APPEARANCE: Alert, conversant. Appropriately groomed. No acute distress.  SKIN: No diaphoresis rash HEAD: Normocephalic, atraumatic  EYES: Conjunctiva/lids clear. Pupils round, reactive. EOMs intact.  EARS: External exam WNL, canals clear. Hearing grossly normal.  NOSE: No deformity or discharge.  MOUTH/THROAT: Lips w/o lesions.  RESPIRATORY: Breathing is even, unlabored. Lung sounds are clear   CARDIOVASCULAR: Heart RRR no murmurs, rubs or gallops. No peripheral edema.  GASTROINTESTINAL: Abdomen is soft, non-tender, not distended w/ normal bowel sounds GENITOURINARY: Bladder non tender, not distended  MUSCULOSKELETAL: No abnormal joints or musculature NEUROLOGIC: Oriented X3. Cranial nerves 2-12 grossly intact. Moves all extremities no tremor. PSYCHIATRIC: Mood and affect appropriate to situation, no behavioral issues  Patient Active Problem List   Diagnosis Date Noted  . Hyperglycemia 07/24/2013  . GERD (gastroesophageal reflux  disease) 07/24/2013  . Hypokalemia 07/20/2013  . 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 Hemoptysis 06/23/2013  . Acute blood loss anemia 06/23/2013  . Respiratory failure with hypoxia 06/23/2013  . 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 Perforation of sigmoid colon - stercoral 09/27/2012  . Constipation, chronic 09/27/2012  . Obesity (BMI 30-39.9) 09/23/2012  . Anxiety associated with depression 09/23/2012  . Hx of tobacco use, presenting hazards to health 09/23/2012  . H/O gastroesophageal reflux (GERD) 09/23/2012  . At high risk for falls 09/23/2012    CBC    Component Value Date/Time   WBC 11.6* 07/22/2013 0500   RBC 3.04* 07/22/2013 0500  RBC 3.09* 06/27/2013 2000   HGB 7.8* 07/22/2013 0500   HCT 26.6* 07/22/2013 0500   PLT 185 07/22/2013 0500   MCV 87.5 07/22/2013 0500   LYMPHSABS 1.2 07/18/2013 0552   MONOABS 0.6 07/18/2013 0552   EOSABS 0.2 07/18/2013 0552   BASOSABS 0.0 07/18/2013 0552    CMP     Component Value Date/Time   NA 142 07/22/2013 0500   K 3.8 07/22/2013 0500   CL 99 07/22/2013 0500   CO2 30 07/22/2013 0500   GLUCOSE 137* 07/22/2013 0500   BUN 23 07/22/2013 0500   CREATININE 0.82 07/22/2013 0500   CALCIUM 8.9 07/22/2013 0500   PROT 6.1 07/08/2013 0611   ALBUMIN 2.8* 07/08/2013 0611   AST 14 07/08/2013 0611   ALT 35 07/08/2013 0611   ALKPHOS 108 07/08/2013 0611   BILITOT 0.9 07/08/2013 0611   GFRNONAA 81* 07/22/2013 0500   GFRAA >90 07/22/2013 0500    Assessment and Plan  Diffuse pulmonary alveolar hemorrhage Outpatient pulmonary follow up arranged (see below)  Maintain prednisone 40mg  daily until seen by Rheumatology  S/p 7/7 plasmapheresis  Continue atovaquone for PCP prophylaxis (sulfa allergy)  Needs to see rheumatology as outpt ONE DAY post d/c - appt arranged (see below)  Continue Oxygen at 4L per [place] for discharge   ANCA-positive vasculitis As above; hemorrhage as result of vasculitis  Respiratory failure with hypoxia 2/2 alveolar  hemmorhage  Acute blood loss anemia Cont iron supplement  F/u cbc - high risk pancytopenia  Continue Atovaquone for PCP prophylaxis  outpt f/u - PCP, rheum   Hyperglycemia Cont SSI and novolog meal coverage  Carb modified diet  As steroids are reduced, monitor glucose for insulin needs   GERD (gastroesophageal reflux disease) Cont protonix  Daily miralax    Anxiety associated with depression Continue bupropion, celexa, seroquel, xanax    Obesity (BMI 30-39.9) Noted;diet and OT/PT    Hennie Duos, MD

## 2013-07-27 ENCOUNTER — Encounter: Payer: Self-pay | Admitting: Internal Medicine

## 2013-07-31 ENCOUNTER — Encounter (HOSPITAL_COMMUNITY): Payer: Self-pay | Admitting: Emergency Medicine

## 2013-07-31 ENCOUNTER — Emergency Department (HOSPITAL_COMMUNITY): Payer: 59

## 2013-07-31 ENCOUNTER — Emergency Department (HOSPITAL_COMMUNITY)
Admission: EM | Admit: 2013-07-31 | Discharge: 2013-07-31 | Disposition: A | Discharge status: Home / Self Care | Payer: 59 | Attending: Emergency Medicine | Admitting: Emergency Medicine

## 2013-07-31 DIAGNOSIS — E669 Obesity, unspecified: Secondary | ICD-10-CM | POA: Insufficient documentation

## 2013-07-31 DIAGNOSIS — F341 Dysthymic disorder: Secondary | ICD-10-CM | POA: Insufficient documentation

## 2013-07-31 DIAGNOSIS — T148XXA Other injury of unspecified body region, initial encounter: Secondary | ICD-10-CM

## 2013-07-31 DIAGNOSIS — Z79899 Other long term (current) drug therapy: Secondary | ICD-10-CM | POA: Insufficient documentation

## 2013-07-31 DIAGNOSIS — Z794 Long term (current) use of insulin: Secondary | ICD-10-CM | POA: Insufficient documentation

## 2013-07-31 DIAGNOSIS — S060X0A Concussion without loss of consciousness, initial encounter: Secondary | ICD-10-CM

## 2013-07-31 DIAGNOSIS — Y9389 Activity, other specified: Secondary | ICD-10-CM | POA: Insufficient documentation

## 2013-07-31 DIAGNOSIS — Z87891 Personal history of nicotine dependence: Secondary | ICD-10-CM | POA: Insufficient documentation

## 2013-07-31 DIAGNOSIS — S0083XA Contusion of other part of head, initial encounter: Secondary | ICD-10-CM | POA: Insufficient documentation

## 2013-07-31 DIAGNOSIS — S1093XA Contusion of unspecified part of neck, initial encounter: Secondary | ICD-10-CM

## 2013-07-31 DIAGNOSIS — IMO0002 Reserved for concepts with insufficient information to code with codable children: Secondary | ICD-10-CM | POA: Insufficient documentation

## 2013-07-31 DIAGNOSIS — W1809XA Striking against other object with subsequent fall, initial encounter: Secondary | ICD-10-CM | POA: Insufficient documentation

## 2013-07-31 DIAGNOSIS — W19XXXA Unspecified fall, initial encounter: Secondary | ICD-10-CM

## 2013-07-31 DIAGNOSIS — S0003XA Contusion of scalp, initial encounter: Secondary | ICD-10-CM | POA: Insufficient documentation

## 2013-07-31 DIAGNOSIS — K219 Gastro-esophageal reflux disease without esophagitis: Secondary | ICD-10-CM | POA: Insufficient documentation

## 2013-07-31 DIAGNOSIS — S0990XA Unspecified injury of head, initial encounter: Secondary | ICD-10-CM | POA: Insufficient documentation

## 2013-07-31 DIAGNOSIS — Y9210 Unspecified residential institution as the place of occurrence of the external cause: Secondary | ICD-10-CM | POA: Insufficient documentation

## 2013-07-31 DIAGNOSIS — S6990XA Unspecified injury of unspecified wrist, hand and finger(s), initial encounter: Secondary | ICD-10-CM | POA: Insufficient documentation

## 2013-07-31 MED ORDER — TETANUS-DIPHTH-ACELL PERTUSSIS 5-2.5-18.5 LF-MCG/0.5 IM SUSP: 0.5000 mL | Freq: Once | INTRAMUSCULAR | Status: DC

## 2013-07-31 MED ORDER — HYDROCODONE-ACETAMINOPHEN 5-325 MG PO TABS
1.0000 | ORAL_TABLET | Freq: Once | ORAL | Status: AC
  Administered 2013-07-31: 1 via ORAL
  Filled 2013-07-31: qty 1

## 2013-07-31 NOTE — ED Provider Notes (Signed)
CSN: [id number]     Arrival date & time 07/31/13  2015 History   First MD Initiated Contact with Patient 07/31/13 2136     Chief Complaint  Patient presents with  . Fall    HPI  History provided by the patient. Patient is a 53 year old female with history of anxiety, depression, obesity and recent acute respiratory failure who presents after a fall. Patient has been at a assisted rehabilitation center and after her physical therapy today and was returned to her room and trying to get out of her wheelchair when she was tangled with her O2 cord causing her to lose balance and fall backwards. She attempted to break her fall by grabbing the dresser and has a small skin tear to her right forearm. She reports landing flat on her back and hitting the back of her head without LOC. The fall occurred around 4:30 PM. She does remember being very tired after the fall but generally thought that she was feeling well. Later she was developing a very bad headache. She also states that the med tech had a very difficult time waking her up which was unusual. She denies any other symptoms. Denies any weakness or numbness in extremities. No confusion. No speech change.    Past Medical History  Diagnosis Date  . Colitis   . Obesity (BMI 30-39.9) 09/23/2012  . Anxiety associated with depression 09/23/2012  . Hx of tobacco use, presenting hazards to health 09/23/2012  . At high risk for falls 09/23/2012    She has had a fractured ankle and tendon tear with falls over the last couple years.  . Diverticulitis of large intestine with perforation 09/23/2012  . GERD (gastroesophageal reflux disease)    Past Surgical History  Procedure Laterality Date  . Appendectomy    . Tonsillectomy    . Bladder repair     Family History  Problem Relation Age of Onset  . Cancer Paternal Grandmother     breast   History  Substance Use Topics  . Smoking status: Former Smoker    Quit date: 01/09/2001  . Smokeless tobacco: Never  Used  . Alcohol Use: No   OB History   Grav Para Term Preterm Abortions TAB SAB Ect Mult Living                 Review of Systems  Gastrointestinal: Negative for nausea and vomiting.  Neurological: Positive for headaches. Negative for weakness and numbness.  Psychiatric/Behavioral: Negative for confusion.  All other systems reviewed and are negative.     Allergies  Sulfa antibiotics  Home Medications   Prior to Admission medications   Medication Sig Start Date End Date Taking? Authorizing Provider  ALPRAZolam Duanne Moron) 1 MG tablet Take 1 tablet (1 mg total) by mouth 3 (three) times daily as needed for anxiety. 07/22/13  Yes Estill Dooms, MD  atovaquone [facility]) 750 MG/5ML suspension Take 10 mLs (1,500 mg total) by mouth daily with breakfast. 07/21/13  Yes Donita Brooks, NP  azaTHIOprine (IMURAN) 50 MG tablet Take 100 mg by mouth daily.   Yes Historical Provider, MD  buPROPion (WELLBUTRIN XL) 300 MG 24 hr tablet Take 300 mg by mouth daily.  08/30/12  Yes Historical Provider, MD  chlorpheniramine-HYDROcodone (TUSSIONEX) 10-8 MG/5ML LQCR Take 5 mLs by mouth every 12 (twelve) hours as needed for cough. 07/22/13  Yes Estill Dooms, MD  citalopram (CELEXA) 40 MG tablet Take 40 mg by mouth daily.   Yes Historical Provider, MD  ferrous sulfate 325 (65 FE) MG tablet Take 1 tablet (325 mg total) by mouth 2 (two) times daily with a meal. 07/21/13  Yes Donita Brooks, NP  folic acid (FOLVITE) 1 MG tablet Take 5 tablets (5 mg total) by mouth daily. 07/21/13  Yes Donita Brooks, NP  HYDROcodone-acetaminophen (NORCO/VICODIN) 5-325 MG per tablet Take 1 tablet by mouth every 4 (four) hours as needed for moderate pain. 07/22/13  Yes Estill Dooms, MD  insulin regular (NOVOLIN R RELION) 100 units/mL injection Inject 0.03 mLs (3 Units total) into the skin 3 (three) times daily before meals. 07/21/13  Yes Donita Brooks, NP  insulin regular (NOVOLIN R,HUMULIN R) 100 units/mL injection Inject 0-20 Units into  the skin See admin instructions. Correction coverage: Resistant (obese, steroids) CBG < 70: implement hypoglycemia protocol - orange juice, glucose tabs or dextrose  CBG 70 - 120: 0 units CBG 121 - 150: 3 units CBG 151 - 200: 4 units CBG 201 - 250: 7 units CBG 251 - 300: 11 units CBG 301 - 350: 15 units CBG 351 - 400: 20 units CBG > 400: call MD, Informant: Self, Last Dose: 07/31/2013 at Unknown time   Yes Historical Provider, MD  omeprazole (PRILOSEC) 20 MG capsule Take 20 mg by mouth daily.   Yes Historical Provider, MD  polyethylene glycol (MIRALAX / GLYCOLAX) packet Take 17 g by mouth 2 (two) times daily as needed (constipation).    Yes Historical Provider, MD  predniSONE (DELTASONE) 20 MG tablet Take 2 tablets (40 mg total) by mouth daily with breakfast. Remain on 40 mg QD until seen by Rheumatology. 07/21/13  Yes Donita Brooks, NP  QUEtiapine (SEROQUEL) 100 MG tablet Take 1 tablet (100 mg total) by mouth at bedtime. 07/21/13  Yes Donita Brooks, NP   BP 145/64  Pulse 105  Temp(Src) 98.7 F (37.1 C) (Oral)  Resp 16  SpO2 99% Physical Exam  Nursing note and vitals reviewed. Constitutional: She is oriented to person, place, and time. She appears well-developed and well-nourished. No distress.  HENT:  Head: Normocephalic.  Slight hematoma and tenderness to the posterior scalp. No break of the skin or bleeding. No depressed skull fracture. No Battle sign or raccoon eyes.  Neck: Normal range of motion. Neck supple.  No cervical midline tenderness.  Cardiovascular: Normal rate and regular rhythm.   Pulmonary/Chest: Effort normal and breath sounds normal. No respiratory distress. She has no wheezes.  Abdominal: Soft.  Neurological: She is alert and oriented to person, place, and time. She has normal strength. No cranial nerve deficit or sensory deficit.  Skin: Skin is warm and dry. No rash noted.  Psychiatric: She has a normal mood and affect. Her behavior is normal.    ED Course   Procedures   COORDINATION OF CARE:  Nursing notes reviewed. Vital signs reviewed. Initial pt interview and examination performed.   Filed Vitals:   07/31/13 2024 07/31/13 2143  BP: 127/70 145/64  Pulse: 108 105  Temp: 98.7 F (37.1 C)   TempSrc: Oral   Resp: 16   SpO2: 97% 99%    10:09 PM-patient seen and evaluated. She appears well no acute distress. Normal nonfocal neuro exam. Slight hematoma to the posterior scalp with tenderness. No bleeding. No depressed skull fracture. No Battle sign recognized. Normal nonfocal neuro exam.  CT report negative. This time suspect symptoms of concussion. Patient may return home to the skilled nursing facility.   Treatment plan initiated: Medications  HYDROcodone-acetaminophen (  NORCO/VICODIN) 5-325 MG per tablet 1 tablet (1 tablet Oral Given 07/31/13 2223)     Imaging Review Ct Head Wo Contrast  07/31/2013   CLINICAL DATA:  Status post fall.  Hit back of head.  Dizziness.  EXAM: CT HEAD WITHOUT CONTRAST  TECHNIQUE: Contiguous axial images were obtained from the base of the skull through the vertex without intravenous contrast.  COMPARISON:  None.  FINDINGS: There is no evidence of acute infarction, mass lesion, or intra- or extra-axial hemorrhage on CT.  The posterior fossa, including the cerebellum, brainstem and fourth ventricle, is within normal limits. The third and lateral ventricles, and basal ganglia are unremarkable in appearance. The cerebral hemispheres are symmetric in appearance, with normal gray-white differentiation. No mass effect or midline shift is seen.  There is no evidence of fracture; visualized osseous structures are unremarkable in appearance. Mild bilateral proptosis is noted. The paranasal sinuses and mastoid air cells are well-aerated. No significant soft tissue abnormalities are seen.  IMPRESSION: 1. No acute intracranial pathology seen on CT. 2. Mild bilateral proptosis noted.   Electronically Signed   By: Garald Balding  M.D.   On: 07/31/2013 23:00     MDM   Final diagnoses:  Fall, initial encounter  Concussion, without loss of consciousness, initial encounter  Skin abrasion       Martie Lee, PA-C 07/31/13 2354

## 2013-07-31 NOTE — ED Notes (Signed)
Pt. lost her fell backward at nursing home this afternoon after her physical therapy , reports headache with skin tear at right forearm dressed at nursing home . Alert and oriented / respirations unlabored .

## 2013-07-31 NOTE — Discharge Instructions (Signed)
Your CAT scan did not show any concerning injuries from your fall and head injury. At this time your providers feel that you have symptoms of a concussion. Please follow up with your primary care provider for continued evaluation and treatment.    Concussion A concussion is a brain injury. It is caused by:  A hit to the head.  A quick and sudden movement (jolt) of the head or neck. A concussion is usually not life threatening. Even so, it can cause serious problems. If you had a concussion before, you may have concussion-like problems after a hit to your head. HOME CARE General Instructions  Follow your doctor's directions carefully.  Take medicines only as told by your doctor.  Only take medicines your doctor says are safe.  Do not drink alcohol until your doctor says it is okay. Alcohol and some drugs can slow down healing. They can also put you at risk for further injury.  If you are having trouble remembering things, write them down.  Try to do one thing at a time if you get distracted easily. For example, do not watch TV while making dinner.  Talk to your family members or close friends when making important decisions.  Follow up with your doctor as told.  Watch your symptoms. Tell others to do the same. Serious problems can sometimes happen after a concussion. Older adults are more likely to have these problems.  Tell your teachers, school nurse, school counselor, coach, Product/process development scientist, or work Freight forwarder about your concussion. Tell them about what you can or cannot do. They should watch to see if:  It gets even harder for you to pay attention or concentrate.  It gets even harder for you to remember things or learn new things.  You need more time than normal to finish things.  You become annoyed (irritable) more than before.  You are not able to deal with stress as well.  You have more problems than before.  Rest. Make sure you:  Get plenty of sleep at  night.  Go to sleep early.  Go to bed at the same time every day. Try to wake up at the same time.  Rest during the day.  Take naps when you feel tired.  Limit activities where you have to think a lot or concentrate. These include:  Doing homework.  Doing work related to a job.  Watching TV.  Using the computer. Returning To Your Regular Activities Return to your normal activities slowly, not all at once. You must give your body and brain enough time to heal.   Do not play sports or do other athletic activities until your doctor says it is okay.  Ask your doctor when you can drive, ride a bicycle, or work other vehicles or machines. Never do these things if you feel dizzy.  Ask your doctor about when you can return to work or school. Preventing Another Concussion It is very important to avoid another brain injury, especially before you have healed. In rare cases, another injury can lead to permanent brain damage, brain swelling, or death. The risk of this is greatest during the first 7-10 days after your injury. Avoid injuries by:   Wearing a seat belt when riding in a car.  Not drinking too much alcohol.  Avoiding activities that could lead to a second concussion (such as contact sports).  Wearing a helmet when doing activities like:  Biking.  Skiing.  Skateboarding.  Skating.  Making your home safer  by:  Removing things from the floor or stairways that could make you trip.  Using grab bars in bathrooms and handrails by stairs.  Placing non-slip mats on floors and in bathtubs.  Improve lighting in dark areas. GET HELP IF:  It gets even harder for you to pay attention or concentrate.  It gets even harder for you to remember things or learn new things.  You need more time than normal to finish things.  You become annoyed (irritable) more than before.  You are not able to deal with stress as well.  You have more problems than before.  You have  problems keeping your balance.  You are not able to react quickly when you should. Get help if you have any of these problems for more than 2 weeks:   Lasting (chronic) headaches.  Dizziness or trouble balancing.  Feeling sick to your stomach (nausea).  Seeing (vision) problems.  Being affected by noises or light more than normal.  Feeling sad, low, down in the dumps, blue, gloomy, or empty (depressed).  Mood changes (mood swings).  Feeling of fear or nervousness about what may happen (anxiety).  Feeling annoyed.  Memory problems.  Problems concentrating or paying attention.  Sleep problems.  Feeling tired all the time. GET HELP RIGHT AWAY IF:   You have bad headaches or your headaches get worse.  You have weakness (even if it is in one hand, leg, or part of the face).  You have loss of feeling (numbness).  You feel off balance.  You keep throwing up (vomiting).  You feel tired.  One black center of your eye (pupil) is larger than the other.  You twitch or shake violently (convulse).  Your speech is not clear (slurred).  You are more confused, easily angered (agitated), or annoyed than before.  You have more trouble resting than before.  You are unable to recognize people or places.  You have neck pain.  It is difficult to wake you up.  You have unusual behavior changes.  You pass out (lose consciousness). MAKE SURE YOU:   Understand these instructions.  Will watch your condition.  Will get help right away if you are not doing well or get worse. Document Released: 12/14/2008 Document Revised: 05/12/2013 Document Reviewed: 07/18/2012 [facility] Patient Information 2015 [place], [place]. This information is not intended to replace advice given to you by your health care provider. Make sure you discuss any questions you have with your health care provider.    Abrasions An abrasion is a cut or scrape of the skin. Abrasions do not go through all  layers of the skin. HOME CARE  If a bandage (dressing) was put on your wound, change it as told by your doctor. If the bandage sticks, soak it off with warm.  Wash the area with water and soap 2 times a day. Rinse off the soap. Pat the area dry with a clean towel.  Put on medicated cream (ointment) as told by your doctor.  Change your bandage right away if it gets wet or dirty.  Only take medicine as told by your doctor.  See your doctor within 24-48 hours to get your wound checked.  Check your wound for redness, puffiness (swelling), or yellowish-white fluid (pus). GET HELP RIGHT AWAY IF:   You have more pain in the wound.  You have redness, swelling, or tenderness around the wound.  You have pus coming from the wound.  You have a fever or lasting symptoms for  more than 2-3 days.  You have a fever and your symptoms suddenly get worse.  You have a bad smell coming from the wound or bandage. MAKE SURE YOU:   Understand these instructions.  Will watch your condition.  Will get help right away if you are not doing well or get worse. Document Released: 06/14/2007 Document Revised: 09/20/2011 Document Reviewed: 11/29/2010 [facility] Patient Information 2015 [place], [place]. This information is not intended to replace advice given to you by your health care provider. Make sure you discuss any questions you have with your health care provider.

## 2013-07-31 NOTE — ED Notes (Signed)
Pt. Fell at rehab center ~ 1630. Pt. Fell backwards - hit head on floor. Pt. Feel winded. Home oxygen 4L. No loc. Pt. Does have a h/a.

## 2013-08-01 NOTE — ED Provider Notes (Signed)
Medical screening examination/treatment/procedure(s) were performed by non-physician practitioner and as supervising physician I was immediately available for consultation/collaboration.   EKG Interpretation None        Ephraim Hamburger, MD 08/01/13 2210

## 2013-08-04 ENCOUNTER — Non-Acute Institutional Stay (SKILLED_NURSING_FACILITY): Payer: 59 | Admitting: Internal Medicine

## 2013-08-04 ENCOUNTER — Encounter: Payer: Self-pay | Admitting: Internal Medicine

## 2013-08-04 DIAGNOSIS — D62 Acute posthemorrhagic anemia: Secondary | ICD-10-CM

## 2013-08-04 DIAGNOSIS — F341 Dysthymic disorder: Secondary | ICD-10-CM

## 2013-08-04 DIAGNOSIS — R7309 Other abnormal glucose: Secondary | ICD-10-CM

## 2013-08-04 DIAGNOSIS — E669 Obesity, unspecified: Secondary | ICD-10-CM

## 2013-08-04 DIAGNOSIS — I776 Arteritis, unspecified: Secondary | ICD-10-CM

## 2013-08-04 DIAGNOSIS — R739 Hyperglycemia, unspecified: Secondary | ICD-10-CM

## 2013-08-04 DIAGNOSIS — F418 Other specified anxiety disorders: Secondary | ICD-10-CM

## 2013-08-04 DIAGNOSIS — R0489 Hemorrhage from other sites in respiratory passages: Secondary | ICD-10-CM

## 2013-08-04 DIAGNOSIS — K219 Gastro-esophageal reflux disease without esophagitis: Secondary | ICD-10-CM

## 2013-08-04 DIAGNOSIS — R042 Hemoptysis: Secondary | ICD-10-CM

## 2013-08-04 DIAGNOSIS — I7782 Antineutrophilic cytoplasmic antibody (ANCA) vasculitis: Secondary | ICD-10-CM

## 2013-08-04 NOTE — Progress Notes (Signed)
MRN: [id number] Name: [name]  Sex: female Age: 53 y.o. DOB: 1960/04/12  [place] #: Helene Kelp Facility/Room: 115 Level Of Care: SNF Provider: Inocencio Homes D Emergency Contacts: Extended Emergency Contact Information Primary Emergency Contact: Kathyrn Sheriff [place] Phone: [phone] Mobile Phone: [phone] Relation: Sister Secondary Emergency Contact: Sorlie,John Address: [address]          [place] [address] Johnnette Litter of [place] Phone: [phone] Mobile Phone: [phone] Relation: Spouse   Allergies: Sulfa antibiotics  Chief Complaint  Patient presents with  . Discharge Note    HPI: Patient is 53 y.o. female who was admitted to SNF after a pulmonary hemorrhage 2/2 vasculitis who is ready to go home.  Past Medical History  Diagnosis Date  . Colitis   . Obesity (BMI 30-39.9) 09/23/2012  . Anxiety associated with depression 09/23/2012  . Hx of tobacco use, presenting hazards to health 09/23/2012  . At high risk for falls 09/23/2012    She has had a fractured ankle and tendon tear with falls over the last couple years.  . Diverticulitis of large intestine with perforation 09/23/2012  . GERD (gastroesophageal reflux disease)     Past Surgical History  Procedure Laterality Date  . Appendectomy    . Tonsillectomy    . Bladder repair        Medication List       This list is accurate as of: 08/04/13  9:27 PM.  Always use your most recent med list.               ALPRAZolam 1 MG tablet  Commonly known as:  XANAX  Take 1 tablet (1 mg total) by mouth 3 (three) times daily as needed for anxiety.     atovaquone 750 MG/5ML suspension  Commonly known as:  MEPRON  Take 10 mLs (1,500 mg total) by mouth daily with breakfast.     azaTHIOprine 50 MG tablet  Commonly known as:  IMURAN  Take 100 mg by mouth daily.     buPROPion 300 MG 24 hr tablet  Commonly known as:  WELLBUTRIN XL  Take 300 mg by mouth daily.      chlorpheniramine-HYDROcodone 10-8 MG/5ML Lqcr  Commonly known as:  TUSSIONEX  Take 5 mLs by mouth every 12 (twelve) hours as needed for cough.     citalopram 40 MG tablet  Commonly known as:  CELEXA  Take 40 mg by mouth daily.     ferrous sulfate 325 (65 FE) MG tablet  Take 1 tablet (325 mg total) by mouth 2 (two) times daily with a meal.     folic acid 1 MG tablet  Commonly known as:  FOLVITE  Take 5 tablets (5 mg total) by mouth daily.     HYDROcodone-acetaminophen 5-325 MG per tablet  Commonly known as:  NORCO/VICODIN  Take 1 tablet by mouth every 4 (four) hours as needed for moderate pain.     insulin regular 100 units/mL injection  Commonly known as:  NOVOLIN R,HUMULIN R  - Inject 0-20 Units into the skin See admin instructions. Correction coverage: Resistant (obese, steroids)  - CBG < 70: implement hypoglycemia protocol - orange juice, glucose tabs or dextrose  -   - CBG 70 - 120: 0 units  - CBG 121 - 150: 3 units  - CBG 151 - 200: 4 units  - CBG 201 - 250: 7 units  - CBG 251 - 300: 11 units  - CBG 301 - 350:  15 units  - CBG 351 - 400: 20 units  - CBG > 400: call MD, Informant: Self, Last Dose: 07/31/2013 at Unknown time     insulin regular 100 units/mL injection  Commonly known as:  NOVOLIN R RELION  Inject 0.03 mLs (3 Units total) into the skin 3 (three) times daily before meals.     omeprazole 20 MG capsule  Commonly known as:  PRILOSEC  Take 20 mg by mouth daily.     polyethylene glycol packet  Commonly known as:  MIRALAX / GLYCOLAX  Take 17 g by mouth 2 (two) times daily as needed (constipation).     predniSONE 20 MG tablet  Commonly known as:  DELTASONE  Take 2 tablets (40 mg total) by mouth daily with breakfast. Remain on 40 mg QD until seen by Rheumatology.     QUEtiapine 100 MG tablet  Commonly known as:  SEROQUEL  Take 1 tablet (100 mg total) by mouth at bedtime.        No orders of the defined types were placed in this encounter.     There is no immunization history for the selected administration types on file for this patient.  History  Substance Use Topics  . Smoking status: Former Smoker    Quit date: 01/09/2001  . Smokeless tobacco: Never Used  . Alcohol Use: No    Filed Vitals:   08/04/13 1656  BP: 132/73  Pulse: 113  Temp: 97 F (36.1 C)  Resp: 14    Physical Exam  GENERAL APPEARANCE: Alert, conversant. Appropriately groomed. No acute distress.  HEENT: Unremarkable. RESPIRATORY: Breathing is even, unlabored. Lung sounds are clear ;using O2.  CARDIOVASCULAR: Heart RRR no murmurs, rubs or gallops. No peripheral edema.  GASTROINTESTINAL: Abdomen is soft, non-tender, not distended w/ normal bowel sounds.  NEUROLOGIC: Cranial nerves 2-12 grossly intact. Moves all extremities no tremor.  Patient Active Problem List   Diagnosis Date Noted  . Hyperglycemia 07/24/2013  . GERD (gastroesophageal reflux disease) 07/24/2013  . Hypokalemia 07/20/2013  . 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 Hemoptysis 06/23/2013  . Acute blood loss anemia 06/23/2013  . Respiratory failure with hypoxia 06/23/2013  . 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 Perforation of sigmoid colon - stercoral 09/27/2012  . Constipation, chronic 09/27/2012  . Obesity (BMI 30-39.9) 09/23/2012  . Anxiety associated with depression 09/23/2012  . Hx of tobacco use, presenting hazards to health 09/23/2012  . H/O gastroesophageal reflux (GERD) 09/23/2012  . At high risk for falls 09/23/2012    CBC    Component Value Date/Time   WBC 11.6* 07/22/2013 0500   RBC 3.04* 07/22/2013 0500   RBC 3.09* 06/27/2013 2000   HGB 7.8* 07/22/2013 0500   HCT 26.6* 07/22/2013 0500   PLT 185 07/22/2013 0500   MCV 87.5 07/22/2013 0500   LYMPHSABS 1.2 07/18/2013 0552   MONOABS 0.6 07/18/2013 0552   EOSABS 0.2 07/18/2013 0552   BASOSABS 0.0 07/18/2013 0552    CMP     Component Value Date/Time   NA 142 07/22/2013 0500   K 3.8 07/22/2013 0500    CL 99 07/22/2013 0500   CO2 30 07/22/2013 0500   GLUCOSE 137* 07/22/2013 0500   BUN 23 07/22/2013 0500   CREATININE 0.82 07/22/2013 0500   CALCIUM 8.9 07/22/2013 0500   PROT 6.1 07/08/2013 0611   ALBUMIN 2.8* 07/08/2013 0611   AST 14 07/08/2013 0611   ALT 35 07/08/2013 0611   ALKPHOS 108 07/08/2013 0611   BILITOT 0.9  07/08/2013 0611   GFRNONAA 81* 07/22/2013 0500   GFRAA >90 07/22/2013 0500    Assessment and Plan  A very motivated patient! She is able to do ADL's but appears she will need O2 at home. She is following up with Dr R tomorrow and he will make that decision.  Hennie Duos, MD

## 2013-08-05 ENCOUNTER — Other Ambulatory Visit: Payer: Self-pay | Admitting: Adult Health

## 2013-08-05 ENCOUNTER — Ambulatory Visit (INDEPENDENT_AMBULATORY_CARE_PROVIDER_SITE_OTHER)
Admission: RE | Admit: 2013-08-05 | Discharge: 2013-08-05 | Disposition: A | Discharge status: Home / Self Care | Payer: 59 | Source: Ambulatory Visit | Attending: Adult Health | Admitting: Adult Health

## 2013-08-05 ENCOUNTER — Encounter: Payer: Self-pay | Admitting: Adult Health

## 2013-08-05 ENCOUNTER — Other Ambulatory Visit (INDEPENDENT_AMBULATORY_CARE_PROVIDER_SITE_OTHER): Payer: 59

## 2013-08-05 ENCOUNTER — Ambulatory Visit (INDEPENDENT_AMBULATORY_CARE_PROVIDER_SITE_OTHER): Payer: 59 | Admitting: Adult Health

## 2013-08-05 VITALS — BP 116/64 | HR 101 | Temp 98.3°F | Ht 69.0 in | Wt 323.5 lb

## 2013-08-05 DIAGNOSIS — R042 Hemoptysis: Secondary | ICD-10-CM

## 2013-08-05 DIAGNOSIS — J9611 Chronic respiratory failure with hypoxia: Secondary | ICD-10-CM

## 2013-08-05 DIAGNOSIS — R0489 Hemorrhage from other sites in respiratory passages: Secondary | ICD-10-CM

## 2013-08-05 DIAGNOSIS — J9610 Chronic respiratory failure, unspecified whether with hypoxia or hypercapnia: Secondary | ICD-10-CM

## 2013-08-05 DIAGNOSIS — R0902 Hypoxemia: Secondary | ICD-10-CM

## 2013-08-05 DIAGNOSIS — M300 Polyarteritis nodosa: Secondary | ICD-10-CM

## 2013-08-05 DIAGNOSIS — M317 Microscopic polyangiitis: Secondary | ICD-10-CM

## 2013-08-05 LAB — CBC WITH DIFFERENTIAL/PLATELET
Basophils Absolute: 0 10*3/uL (ref 0.0–0.1)
Basophils Relative: 0 % (ref 0.0–3.0)
Eosinophils Absolute: 0.1 10*3/uL (ref 0.0–0.7)
Eosinophils Relative: 0.6 % (ref 0.0–5.0)
HCT: 31.2 % — ABNORMAL LOW (ref 36.0–46.0)
Hemoglobin: 10 g/dL — ABNORMAL LOW (ref 12.0–15.0)
Lymphocytes Relative: 3.9 % — ABNORMAL LOW (ref 12.0–46.0)
Lymphs Abs: 0.7 10*3/uL (ref 0.7–4.0)
MCHC: 32 g/dL (ref 30.0–36.0)
MCV: 84.3 fl (ref 78.0–100.0)
Monocytes Absolute: 0.5 10*3/uL (ref 0.1–1.0)
Monocytes Relative: 2.5 % — ABNORMAL LOW (ref 3.0–12.0)
Neutro Abs: 17.5 10*3/uL — ABNORMAL HIGH (ref 1.4–7.7)
Neutrophils Relative %: 93 % — ABNORMAL HIGH (ref 43.0–77.0)
Platelets: 278 10*3/uL (ref 150.0–400.0)
RBC: 3.7 Mil/uL — ABNORMAL LOW (ref 3.87–5.11)
RDW: 21.5 % — ABNORMAL HIGH (ref 11.5–15.5)
WBC: 18.8 10*3/uL (ref 4.0–10.5)

## 2013-08-05 LAB — BASIC METABOLIC PANEL
BUN: 22 mg/dL (ref 6–23)
CO2: 30 mEq/L (ref 19–32)
Calcium: 9 mg/dL (ref 8.4–10.5)
Chloride: 98 mEq/L (ref 96–112)
Creatinine, Ser: 0.8 mg/dL (ref 0.4–1.2)
GFR: 81.03 mL/min (ref >60.00)
Glucose, Bld: 177 mg/dL — ABNORMAL HIGH (ref 70–99)
Potassium: 4.2 mEq/L (ref 3.5–5.1)
Sodium: 137 mEq/L (ref 135–145)

## 2013-08-05 NOTE — Patient Instructions (Signed)
Try to get a Rolling walker with seat and basket.  Continue Oxygen 2l/m at rest and 4l/m with walking.  Follow up Dr. Chase Caller in 4 weeks and As needed   Labs and chest xray .  Continue follow up with Dr. Ouida Sills -Rheumatology  As planned .  Please contact office for sooner follow up if symptoms do not improve or worsen or seek emergency care

## 2013-08-05 NOTE — Progress Notes (Signed)
Subjective:    Patient ID: [name], female    DOB: 11/08/1960, 53 y.o.   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 PCCM initial consult during hospitalization   08/05/2013 [facility] follow up  pt returns for a post hospital follow up .  She had a prolonged hospital admission .  Presented  with sudden onset shortness of breath & cough with associated hemoptysis and significant hypoxia.  CT of the chest w/  diffuse bilateral airspace opacities.  Serology showed a P ANCA of 1:640, Myeloperoxidase greater than 8. Acute respiratory failure thought related to [facility] from microscopic polyangiitis. It was felt that she would not tolerate lung bx given high O2 demands and she was treated with empiric abx, high dose steroids, weekly rituxan and plasmapheresis x 7 with slow improvement. 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 She was tx w/  atovaquone for PCP prophylaxis. Bacterial / Viral cultures negative. Course was complicated by anemia in setting alveolar hemorrhage, improved on iron supplementation. She was seen by Rheumatology w/ OP follow up set up.  She was very deconditioned and discharged to rehab .  Since discharge she remains very weak. Says she is able to walk with walker but gets very tired.  She says she was seen by Rheumatology last week and   Started on Imuran on 07/31/13 .  Remains on prednisone 40mg  daily  Records requested.  Reports will be discharged from [place] on 7/30.   No further hemoptysis.     Review of Systems Constitutional:   No  weight loss, night sweats,  Fevers, chills, fatigue, or  lassitude.  HEENT:   No headaches,  Difficulty swallowing,  Tooth/dental problems, or  Sore throat,                No  sneezing, itching, ear ache, nasal congestion, post nasal drip,   CV:  No chest pain,  Orthopnea, PND, swelling in lower extremities, anasarca, dizziness, palpitations, syncope.   GI  No heartburn, indigestion, abdominal pain, nausea, vomiting, diarrhea, change in bowel habits, loss of appetite, bloody stools.   Resp: No shortness of breath with exertion or at rest.  No excess mucus, no productive cough,  No non-productive cough,  No coughing up of blood.  No change in color of mucus.  No wheezing.  No chest wall deformity  Skin: no rash or lesions.  GU: no dysuria, change in color of urine, no urgency or frequency.  No flank pain, no hematuria   MS:  No joint pain or swelling.  No decreased range of motion.  No back pain.  Psych:  No change in mood or affect. No depression or anxiety.  No memory loss.         Objective:   Physical Exam GEN: A/Ox3; pleasant , NAD, in wheelchair   HEENT:  [place]/AT,  EACs-clear, TMs-wnl, NOSE-clear, THROAT-clear, no lesions, no postnasal drip or exudate noted.   NECK:  Supple w/ fair ROM; no JVD; normal carotid impulses w/o bruits; no thyromegaly or nodules palpated; no lymphadenopathy.  RESP  Dimiinshed BS in bases  no accessory muscle use, no dullness to percussion  CARD:  RRR, no m/r/g  ,tr  peripheral edema, pulses intact, no cyanosis or clubbing.  GI:  Soft & nt; nml bowel sounds; no organomegaly or masses detected.  Musco: Warm bil, no deformities or joint swelling noted.   Neuro: alert, no focal deficits noted.    Skin: Warm, no lesions or rashes   CXR 08/05/13 Diffuse hazy lung opacities are again  noted bilaterally. Similar to previous exam. No focal airspace  consolidation.        Assessment & Plan:

## 2013-08-07 ENCOUNTER — Telehealth: Payer: Self-pay | Admitting: *Deleted

## 2013-08-07 NOTE — Telephone Encounter (Signed)
Per TP: we do not treat her hyperglycemia and did not prescribe the insulin.  She will need to contact her PCP for this issue.  Called spoke with patient and discussed the above with her.  Pt okay with this recommendation, voiced her understanding and stated she will call her PCP for appt to discuss her blood glucose and medications.  Nothing further needed; will sign off.

## 2013-08-07 NOTE — Telephone Encounter (Signed)
Called and spoke with pt and she stated that she was released from rehab today and she stated that they send her home with rx for insulin and she has no idea how to use this medication.  She stated that they also sent her home with other rx that she has not been on before.  Pt was very tearful and upset during the conversation and is worried about taking these medications.  She stated that she have never been dx as a diabetic and stated that she does not feel that she needs the insulin.  She stated that her insurance is not going to cover the meter that she needs to check her BS>  Pt is asking what she needs to do about these new meds.  TP please advise. thanks

## 2013-08-08 DIAGNOSIS — M317 Microscopic polyangiitis: Secondary | ICD-10-CM | POA: Insufficient documentation

## 2013-08-08 NOTE — Assessment & Plan Note (Signed)
Secondary to microscopic polyangitis  CXR today  Cont O2 to keep sats >90%  Cont follow up with Rheum.   Plan  Try to get a Rolling walker with seat and basket.  Continue Oxygen 2l/m at rest and 4l/m with walking.  Follow up Dr. Chase Caller in 4 weeks and As needed   Labs and chest xray .  Continue follow up with Dr. Ouida Sills -Rheumatology  As planned .  Please contact office for sooner follow up if symptoms do not improve or worsen or seek emergency care

## 2013-08-08 NOTE — Assessment & Plan Note (Signed)
Continue on steroids per Rheumatology .  Continue Oxygen 2l/m at rest and 4l/m with walking.  Follow up Dr. Chase Caller in 4 weeks and As needed   Labs and chest xray .  Continue follow up with Dr. Ouida Sills -Rheumatology  As planned .  Please contact office for sooner follow up if symptoms do not improve or worsen or seek emergency care

## 2013-08-21 ENCOUNTER — Other Ambulatory Visit: Payer: Self-pay | Admitting: Internal Medicine

## 2013-08-22 ENCOUNTER — Other Ambulatory Visit: Payer: Self-pay | Admitting: *Deleted

## 2013-08-22 MED ORDER — ALPRAZOLAM 1 MG PO TABS: 1.0000 mg | ORAL_TABLET | Freq: Three times a day (TID) | ORAL | Status: DC | PRN

## 2013-08-22 NOTE — Telephone Encounter (Signed)
rx faxed to [phone].

## 2013-09-02 ENCOUNTER — Encounter: Payer: Self-pay | Admitting: Internal Medicine

## 2013-09-02 ENCOUNTER — Ambulatory Visit (INDEPENDENT_AMBULATORY_CARE_PROVIDER_SITE_OTHER): Payer: 59 | Admitting: Internal Medicine

## 2013-09-02 ENCOUNTER — Other Ambulatory Visit (INDEPENDENT_AMBULATORY_CARE_PROVIDER_SITE_OTHER): Payer: 59

## 2013-09-02 ENCOUNTER — Ambulatory Visit (INDEPENDENT_AMBULATORY_CARE_PROVIDER_SITE_OTHER)
Admission: RE | Admit: 2013-09-02 | Discharge: 2013-09-02 | Disposition: A | Discharge status: Home / Self Care | Payer: 59 | Source: Ambulatory Visit | Attending: Internal Medicine | Admitting: Internal Medicine

## 2013-09-02 VITALS — BP 148/88 | HR 117 | Ht 69.0 in | Wt 365.0 lb

## 2013-09-02 DIAGNOSIS — R0489 Hemorrhage from other sites in respiratory passages: Secondary | ICD-10-CM

## 2013-09-02 DIAGNOSIS — I776 Arteritis, unspecified: Secondary | ICD-10-CM

## 2013-09-02 DIAGNOSIS — I7782 Antineutrophilic cytoplasmic antibody (ANCA) vasculitis: Secondary | ICD-10-CM

## 2013-09-02 DIAGNOSIS — R042 Hemoptysis: Secondary | ICD-10-CM

## 2013-09-02 LAB — BASIC METABOLIC PANEL
BUN: 27 mg/dL — AB (ref 6–23)
CHLORIDE: 97 meq/L (ref 96–112)
CO2: 28 mEq/L (ref 19–32)
Calcium: 9 mg/dL (ref 8.4–10.5)
Creatinine, Ser: 0.9 mg/dL (ref 0.4–1.2)
GFR: 68.81 mL/min (ref >60.00)
GLUCOSE: 183 mg/dL — AB (ref 70–99)
POTASSIUM: 3.7 meq/L (ref 3.5–5.1)
SODIUM: 140 meq/L (ref 135–145)

## 2013-09-02 NOTE — Patient Instructions (Addendum)
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

## 2013-09-02 NOTE — Progress Notes (Signed)
Subjective:    Patient ID: [name], female    DOB: 1960-02-14, 53 y.o.   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 PCCM initial consult during hospitalization   08/05/2013 [facility] follow up  pt returns for a post hospital follow up .  She had a prolonged hospital admission .  Presented  with sudden onset shortness of breath & cough with associated hemoptysis and significant hypoxia.  CT of the chest w/  diffuse bilateral airspace opacities.  Serology showed a P ANCA of 1:640, Myeloperoxidase greater than 8. Acute respiratory failure thought related to [facility] from microscopic polyangiitis. It was felt that she would not tolerate lung bx given high O2 demands and she was treated with empiric abx, high dose steroids, weekly rituxan and plasmapheresis x 7 with slow improvement. 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 She was tx w/  atovaquone for PCP prophylaxis. Bacterial / Viral cultures negative. Course was complicated by anemia in setting alveolar hemorrhage, improved on iron supplementation. She was seen by Rheumatology w/ OP follow up set up.  She was very deconditioned and discharged to rehab .  Since discharge she remains very weak. Says she is able to walk with walker but gets very tired.  She says she was seen by Rheumatology last week and   Started on Imuran on 07/31/13 .  Remains on prednisone 40mg  daily  Records requested.  Reports will be discharged from [place] on 7/30.   No further hemoptysis.    OV 09/02/2013  Chief Complaint  Patient presents with  . Follow-up    Pt c/o fatigue and SOB. Pt c/o cough and unable to bring mucous up. Pt states when she blows her nose it is brown in color and thick mucous.     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 She is being maintained on immuran. All cytotoxics through Dr Tobie Lords of rheumatology. She reports continued PT at home. SHe is not using walker as much. Reporting less dyspnea; able to do more without o2. She is NOT monitoring her o2 with pulse oximeter. She only decides on it on subjective basis but is mostly compliant with 4L [place]. She is frustrated by weight gain  CXR end jyly 2015: still with infiltrates but obesity obscuring picture  Labs end July 2015: creat normal but still anemiac at 10gm%.    Walking tes on Room air - after being on room air for 10 minutes - desaturated to 88% at 185 feet x 1 lap and got dyspneic as well   No results found for this basename: HGB, HCT, WBC, PLT,  in the last 168 hours   Recent Labs Lab 09/02/13 1554  NA 140  K 3.7  CL 97  CO2 28  GLUCOSE 183*  BUN 27*  CREATININE 0.9  CALCIUM 9.0    Dg Chest 2 View  09/02/2013   CLINICAL DATA:  Shortness of breath. Substernal pressure. Pulmonary alveolar hemorrhage.  EXAM: CHEST  2 VIEW  COMPARISON:  Chest x-rays dated 08/05/2013, 07/10/2013, 07/04/2013 and 06/30/2013 and chest CT dated 07/06/2013  FINDINGS: Heart size and pulmonary vascularity are are normal. Azygos vein remains prominent. Left lung has  cleared. There is minimal residual haziness in the right lung base. No appreciable effusions.  IMPRESSION: Complete clearing of the left lung. Minimal residual haziness at the right base.   Electronically Signed   By: Rozetta Nunnery M.D.   On: 09/02/2013 18:00     Review of Systems  Constitutional: Negative for fever and unexpected weight change.  HENT: Negative for congestion, dental problem, ear pain, nosebleeds, postnasal drip, rhinorrhea, sinus pressure, sneezing, sore throat and trouble swallowing.   Eyes: Negative for redness and itching.  Respiratory: Positive for cough and  shortness of breath. Negative for chest tightness and wheezing.   Cardiovascular: Positive for leg swelling. Negative for palpitations.  Gastrointestinal: Negative for nausea and vomiting.  Genitourinary: Negative for dysuria.  Musculoskeletal: Negative for joint swelling.  Skin: Negative for rash.  Neurological: Negative for headaches.  Hematological: Does not bruise/bleed easily.  Psychiatric/Behavioral: Negative for dysphoric mood. The patient is not nervous/anxious.    Current outpatient prescriptions:ALPRAZolam (XANAX) 1 MG tablet, Take 1 tablet (1 mg total) by mouth 3 (three) times daily as needed for anxiety., Disp: 90 tablet, Rfl: 0;  azaTHIOprine (IMURAN) 50 MG tablet, Take 100 mg by mouth daily., Disp: , Rfl: ;  buPROPion (WELLBUTRIN XL) 300 MG 24 hr tablet, Take 300 mg by mouth daily. , Disp: , Rfl:  chlorpheniramine-HYDROcodone (TUSSIONEX) 10-8 MG/5ML LQCR, Take 5 mLs by mouth every 12 (twelve) hours as needed for cough., Disp: 115 mL, Rfl: 0;  citalopram (CELEXA) 40 MG tablet, Take 40 mg by mouth daily., Disp: , Rfl: ;  ferrous sulfate 325 (65 FE) MG tablet, Take 1 tablet (325 mg total) by mouth 2 (two) times daily with a meal., Disp: 60 tablet, Rfl: 0 folic acid (FOLVITE) 1 MG tablet, Take 5 tablets (5 mg total) by mouth daily., Disp: 30 tablet, Rfl: 0;  furosemide (LASIX) 40 MG tablet, Take 1 tablet by mouth daily., Disp: , Rfl: ;  HYDROcodone-acetaminophen (NORCO/VICODIN) 5-325 MG per tablet, Take 1 tablet by mouth every 6 (six) hours as needed for moderate pain., Disp: , Rfl: ;  omeprazole (PRILOSEC) 20 MG capsule, Take 20 mg by mouth daily., Disp: , Rfl:  polyethylene glycol (MIRALAX / GLYCOLAX) packet, Take 17 g by mouth 2 (two) times daily as needed (constipation). , Disp: , Rfl: ;  predniSONE (DELTASONE) 20 MG tablet, Take 2 tablets (40 mg total) by mouth daily with breakfast. Remain on 40 mg QD until seen by Rheumatology., Disp: 60 tablet, Rfl: 0;  QUEtiapine (SEROQUEL) 100 MG  tablet, Take 1 tablet (100 mg total) by mouth at bedtime., Disp: 30 tablet, Rfl: 0 insulin aspart (NOVOLOG) 100 UNIT/ML injection, Inject 3 Units into the skin 3 (three) times daily before meals., Disp: , Rfl: ;  insulin regular (NOVOLIN R RELION) 100 units/mL injection, Inject 0.03 mLs (3 Units total) into the skin 3 (three) times daily before meals., Disp: 10 mL, Rfl: 11 insulin regular (NOVOLIN R,HUMULIN R) 100 units/mL injection, Inject 0-20 Units into the skin See admin instructions. Correction coverage: Resistant (obese, steroids) CBG < 70: implement hypoglycemia protocol - orange juice, glucose tabs or dextrose  CBG 70 - 120: 0 units CBG 121 - 150: 3 units CBG 151 - 200: 4 units CBG 201 - 250: 7 units CBG 251 - 300: 11 units CBG 301 - 350: 15 units CBG 351 - 400: 20 units CBG > 400: call MD, Informant: Self, Last Dose: 07/31/2013 at Unknown time, Disp: , Rfl:       Objective:  Physical Exam  Vitals reviewed. Constitutional: She is oriented to person, place, and time. She appears well-developed and well-nourished. No distress.  Body mass index is 53.88 kg/(m^2). Looks better but still deconditioned  HENT:  Head: Normocephalic and atraumatic.  Right Ear: External ear normal.  Left Ear: External ear normal.  Mouth/Throat: Oropharynx is clear and moist. No oropharyngeal exudate.  Eyes: Conjunctivae and EOM are normal. Pupils are equal, round, and reactive to light. Right eye exhibits no discharge. Left eye exhibits no discharge. No scleral icterus.  Neck: Normal range of motion. Neck supple. No JVD present. No tracheal deviation present. No thyromegaly present.  Cardiovascular: Normal rate, regular rhythm, normal heart sounds and intact distal pulses.  Exam reveals no gallop and no friction rub.   No murmur heard. Pulmonary/Chest: Effort normal and breath sounds normal. No respiratory distress. She has no wheezes. She has no rales. She exhibits no tenderness.  Abdominal: Soft. Bowel sounds  are normal. She exhibits no distension and no mass. There is no tenderness. There is no rebound and no guarding.  Musculoskeletal: Normal range of motion. She exhibits no edema and no tenderness.  Antalgic gait  Lymphadenopathy:    She has no cervical adenopathy.  Neurological: She is alert and oriented to person, place, and time. She has normal reflexes. No cranial nerve deficit. She exhibits normal muscle tone. Coordination normal.  Skin: Skin is warm and dry. No rash noted. She is not diaphoretic. No erythema. No pallor.  Psychiatric: She has a normal mood and affect. Her behavior is normal. Judgment and thought content normal.   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

## 2013-09-03 DIAGNOSIS — R0489 Hemorrhage from other sites in respiratory passages: Secondary | ICD-10-CM | POA: Insufficient documentation

## 2013-09-03 DIAGNOSIS — I776 Arteritis, unspecified: Secondary | ICD-10-CM | POA: Insufficient documentation

## 2013-09-03 DIAGNOSIS — I7782 Antineutrophilic cytoplasmic antibody (ANCA) vasculitis: Secondary | ICD-10-CM | POA: Insufficient documentation

## 2013-09-03 HISTORY — DX: Hemorrhage from other sites in respiratory passages: R04.89

## 2013-09-03 NOTE — Assessment & Plan Note (Signed)
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  I think you are better significantly; stil needing o2 when walking > 60 yards but you have come a long way forward Buy a pulse oximeter and ensure oxygen levels above 88% / 90% at all times and [place] oxygen accordingly Do CXR 2 view and Do blood test; will call you with results -> update: near total resolution of infiltrates Continue home PT Conitnue immuran through Dr Ouida Sills Will recommend Dr Ouida Sills taper your prednisone slowly to get to 10mg  per da Will send lab results to Dr Ouida Sills  Followup  2 months or sooner if needed; reassess going to back to work only at followup  > 50% of this > 25 min visit spent in face to face counseling (15 min visit converted to 25 min)

## 2013-09-11 NOTE — Progress Notes (Signed)
Quick Note:  lmtcb ______ 

## 2013-09-11 NOTE — Progress Notes (Signed)
Quick Note:  Called and spoke to pt. Informed pt of results and recs per MR. Pt verbalized understanding and denied any further questions or concerns at this time. ______ 

## 2013-09-19 ENCOUNTER — Telehealth: Payer: Self-pay | Admitting: Internal Medicine

## 2013-09-19 NOTE — Telephone Encounter (Signed)
Pt was calling for lab results done on 09/02/13.  BMET is in epic but CBC was not done. I spoke with lab and they states that the order was not released and that is why is was not drawn.  Please advise.  Pt was already informed of cxr results.

## 2013-09-19 NOTE — Telephone Encounter (Signed)
bmet is normal. Please send those results to Dr Tobie Lords too  Thanks  Dr. Brand Males, M.D., [facility].C.P Pulmonary and Critical Care Medicine Staff Physician [place] [facility] Pager: [phone], If no answer or between  15:00h - 7:00h: call [phone]  09/19/2013 3:52 PM

## 2013-09-19 NOTE — Telephone Encounter (Signed)
lmomtcb x1 

## 2013-09-22 NOTE — Telephone Encounter (Signed)
Called and spoke with pt and she is aware of lab results per MR.  Pt is aware and these results have been faxed to Dr. Tobie Lords.

## 2013-09-27 ENCOUNTER — Other Ambulatory Visit: Payer: Self-pay | Admitting: Internal Medicine

## 2013-09-30 ENCOUNTER — Other Ambulatory Visit: Payer: Self-pay | Admitting: Internal Medicine

## 2013-11-04 ENCOUNTER — Ambulatory Visit (INDEPENDENT_AMBULATORY_CARE_PROVIDER_SITE_OTHER): Payer: 59 | Admitting: Internal Medicine

## 2013-11-04 ENCOUNTER — Encounter: Payer: Self-pay | Admitting: Internal Medicine

## 2013-11-04 VITALS — BP 118/70 | HR 98 | Ht 69.0 in | Wt 384.0 lb

## 2013-11-04 DIAGNOSIS — R0489 Hemorrhage from other sites in respiratory passages: Secondary | ICD-10-CM

## 2013-11-04 DIAGNOSIS — J9611 Chronic respiratory failure with hypoxia: Secondary | ICD-10-CM | POA: Insufficient documentation

## 2013-11-04 DIAGNOSIS — M317 Microscopic polyangiitis: Secondary | ICD-10-CM

## 2013-11-04 DIAGNOSIS — I776 Arteritis, unspecified: Secondary | ICD-10-CM

## 2013-11-04 DIAGNOSIS — I7782 Antineutrophilic cytoplasmic antibody (ANCA) vasculitis: Secondary | ICD-10-CM

## 2013-11-04 NOTE — Addendum Note (Signed)
Addended by: Maurice March on: 11/04/2013 04:37 PM   Modules accepted: Orders

## 2013-11-04 NOTE — Patient Instructions (Addendum)
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  #URI  - keep any eye  - if worse, call us and we can call in some antibiotics  #Weight management  - take low glycemic diet sheet: eat only foods in left lane  #FOllowup  1- 2 weeks after CT chest to see Tammy Parrett my NP  repeat walk test at followup YOu can discuss with Tammy PArrett about temporary return to work based on results of repeat CT chest and walk test

## 2013-11-04 NOTE — Progress Notes (Signed)
Subjective:    Patient ID: [name], female    DOB: 29-Nov-1960, 53 y.o.   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 PCCM initial consult during hospitalization   08/05/2013 [facility] follow up  pt returns for a post hospital follow up .  She had a prolonged hospital admission .  Presented  with sudden onset shortness of breath & cough with associated hemoptysis and significant hypoxia.  CT of the chest w/  diffuse bilateral airspace opacities.  Serology showed a P ANCA of 1:640, Myeloperoxidase greater than 8. Acute respiratory failure thought related to [facility] from microscopic polyangiitis. It was felt that she would not tolerate lung bx given high O2 demands and she was treated with empiric abx, high dose steroids, weekly rituxan and plasmapheresis x 7 with slow improvement. 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 She was tx w/  atovaquone for PCP prophylaxis. Bacterial / Viral cultures negative. Course was complicated by anemia in setting alveolar hemorrhage, improved on iron supplementation. She was seen by Rheumatology w/ OP follow up set up.  She was very deconditioned and discharged to rehab .  Since discharge she remains very weak. Says she is able to walk with walker but gets very tired.  She says she was seen by Rheumatology last week and   Started on Imuran on 07/31/13 .  Remains on prednisone 40mg  daily  Records requested.  Reports will be discharged from [place] on 7/30.   No further hemoptysis.    OV 09/02/2013  Chief Complaint  Patient presents with  . Follow-up    Pt c/o fatigue and SOB. Pt c/o cough and unable to bring mucous up. Pt states when she blows her nose it is brown in color and thick mucous.      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 She is being maintained on immuran. All cytotoxics through Dr Tobie Lords of rheumatology. She reports continued PT at home. SHe is not using walker as much. Reporting less dyspnea; able to do more without o2. She is NOT monitoring her o2 with pulse oximeter. She only decides on it on subjective basis but is mostly compliant with 4L [place]. She is frustrated by weight gain  CXR end jyly 2015: still with infiltrates but obesity obscuring picture  Labs end July 2015: creat normal but still anemiac at 10gm%.    Walking tes on Room air - after being on room air for 10 minutes - desaturated to 88% at 185 feet x 1 lap and got dyspneic as well   No results found for this basename: HGB, HCT, WBC, PLT,  in the last 168 hours   Recent Labs Lab 09/02/13 1554  NA 140  K 3.7  CL 97  CO2 28  GLUCOSE 183*  BUN 27*  CREATININE 0.9  CALCIUM 9.0    Dg Chest 2 View  09/02/2013   CLINICAL DATA:  Shortness of breath. Substernal pressure. Pulmonary alveolar hemorrhage.  EXAM: CHEST  2 VIEW  COMPARISON:  Chest x-rays dated 08/05/2013, 07/10/2013, 07/04/2013 and 06/30/2013 and chest CT dated 07/06/2013  FINDINGS: Heart size and pulmonary vascularity are are normal. Azygos vein remains prominent. Left  lung has cleared. There is minimal residual haziness in the right lung base. No appreciable effusions.  IMPRESSION: Complete clearing of the left lung. Minimal residual haziness at the right base.   Electronically Signed   By: Rozetta Nunnery M.D.   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 Follow-up    Pt states her breathing has improved since last OV. Pt c/o DOE but states she is able to be at RA at rest. Pt states she has prod cough with brown mucus and chest heaviness with active and lying down.    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 She continues to have exertional hypoxemia according to her history. She says that when she exerts and does anything beyond basic activities of daily living her chest gets tight and "r heart is ready to jump out" and she gets dyspneic and hypoxemic with pulse ox dropping to 82%. She is frustrated. She feels that by now hypoxemia should have resolved. In terms of her vasculitis she continues with prednisone and Imuran. She is also frustrated due to the immense weight gain. Currently at 384 pounds although a few weeks ago she was as high as 395 pounds. Current body mass index is 56 and was 53 at last visit. In addition she continues to have several somatic complaints including a general feeling of unwellness for the last week along with some brown sputum and some chest tightness. She thinks she might be coming down with a cold. Hwoever, she does NOT want antibiotics  In addition, she isStressed about social financial situation. On one hand she wants to go back to work too on an income. However the work is at [employer] and involves a lot of walking. Currently her vasculitis and obesity prevent her from doing that. On the other hand she does qualify for disability in my opinion but she is afraid to file for it because that'll not overly involved permanent disability but also maintained reduced income. In addition she is fearful that her disability application might be rejected  Walking desaturation test on room air 185 feet 3  laps: DID NOT DESATURATE . THIS IS AN IMPROVEMENT    Review of Systems  Constitutional: Negative for fever and unexpected weight change.  HENT: Negative for congestion, dental problem, ear pain, nosebleeds, postnasal drip, rhinorrhea, sinus pressure, sneezing, sore throat and trouble swallowing.   Eyes: Negative for redness and itching.  Respiratory: Positive for cough and shortness of breath. Negative for chest tightness and wheezing.   Cardiovascular: Negative for palpitations and leg swelling.  Gastrointestinal: Negative for nausea and vomiting.  Genitourinary: Negative for dysuria.  Musculoskeletal: Negative for joint swelling.  Skin: Negative for rash.  Neurological: Negative for headaches.  Hematological: Does not bruise/bleed easily.  Psychiatric/Behavioral: Negative for dysphoric mood. The patient is not nervous/anxious.        Objective:   Physical Exam   Filed Vitals:   11/04/13 1540  BP: 118/70  Pulse: 98  Height: 5\' 9"  (1.753 m)  Weight: 384 lb (174.181 kg)  SpO2: 98%    itals reviewed. Constitutional: She is oriented to person, place, and time. She appears well-developed and well-nourished. No distress.   Body mass index is 56.68 kg/(m^2).  Looks better but still deconditioned  HENT:  Head: Normocephalic and atraumatic.  Right Ear: External ear normal.  Left Ear: External ear normal.  Mouth/Throat: Oropharynx is clear and moist. No oropharyngeal exudate.  Eyes: Conjunctivae and EOM are normal. Pupils are equal, round, and reactive to light. Right eye exhibits no discharge. Left eye exhibits no discharge. No scleral icterus.  Neck: Normal range of motion. Neck supple. No JVD present. No tracheal deviation present. No thyromegaly present.  Cardiovascular: Normal rate, regular rhythm, normal heart sounds and intact distal pulses.  Exam reveals no gallop and no friction rub.   No murmur heard. Pulmonary/Chest: Effort normal and breath sounds normal. No  respiratory distress. She has no wheezes. She has no rales. She exhibits no tenderness.  Abdominal: Soft. Bowel sounds are normal. She exhibits no distension and no mass. There is no tenderness. There is no rebound and no guarding.  Musculoskeletal: Normal range of motion. She exhibits no edema and no tenderness.  Antalgic gait  Lymphadenopathy:    She has no cervical adenopathy.  Neurological: She is alert and oriented to person, place, and time. She has normal reflexes. No cranial nerve deficit. She exhibits normal muscle tone. Coordination normal.  Skin: Skin is warm and dry. No rash noted. She is not diaphoretic. No erythema. No pallor.  Psychiatric: She has a normal mood and affect. Her behavior is normal. Judgment and thought content normal.     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  #URI  - keep any eye  - if worse, call us and we can call in some antibiotics  #Weight management  - take low glycemic diet sheet: eat only foods in left lane  #FOllowup  1- 2 weeks after CT chest to see Tammy Parrett my NP  repeat walk test at followup YOu can discuss with Tammy PArrett about temporary return to work based on results of repeat CT  chest and walk test   > 50% of this > 25 min visit spent in face to face counseling (15 min visit converted to 25 min)    Dr. Brand Males, M.D., [facility].C.P Pulmonary and Critical Care Medicine Staff Physician [facility] Pager: [phone], If no answer or between  15:00h - 7:00h: call [phone]  11/04/2013 4:32 PM

## 2013-11-13 ENCOUNTER — Inpatient Hospital Stay: Admission: RE | Admit: 2013-11-13 | Payer: 59 | Source: Ambulatory Visit

## 2013-11-14 ENCOUNTER — Inpatient Hospital Stay: Admission: RE | Admit: 2013-11-14 | Payer: 59 | Source: Ambulatory Visit

## 2013-11-18 ENCOUNTER — Ambulatory Visit (INDEPENDENT_AMBULATORY_CARE_PROVIDER_SITE_OTHER)
Admission: RE | Admit: 2013-11-18 | Discharge: 2013-11-18 | Disposition: A | Discharge status: Home / Self Care | Payer: 59 | Source: Ambulatory Visit | Attending: Internal Medicine | Admitting: Internal Medicine

## 2013-11-18 DIAGNOSIS — M317 Microscopic polyangiitis: Secondary | ICD-10-CM

## 2013-11-18 DIAGNOSIS — J9611 Chronic respiratory failure with hypoxia: Secondary | ICD-10-CM

## 2013-11-18 DIAGNOSIS — I776 Arteritis, unspecified: Secondary | ICD-10-CM

## 2013-11-20 ENCOUNTER — Ambulatory Visit: Payer: 59 | Admitting: Adult Health

## 2013-11-27 ENCOUNTER — Ambulatory Visit (INDEPENDENT_AMBULATORY_CARE_PROVIDER_SITE_OTHER): Payer: 59 | Admitting: Adult Health

## 2013-11-27 ENCOUNTER — Encounter: Payer: Self-pay | Admitting: Adult Health

## 2013-11-27 VITALS — BP 134/74 | HR 90 | Ht 69.0 in | Wt 386.4 lb

## 2013-11-27 DIAGNOSIS — R042 Hemoptysis: Secondary | ICD-10-CM

## 2013-11-27 DIAGNOSIS — R0489 Hemorrhage from other sites in respiratory passages: Secondary | ICD-10-CM

## 2013-11-27 DIAGNOSIS — J9611 Chronic respiratory failure with hypoxia: Secondary | ICD-10-CM

## 2013-11-27 MED ORDER — HYDROCOD POLST-CHLORPHEN POLST 10-8 MG/5ML PO LQCR
5.0000 mL | Freq: Two times a day (BID) | ORAL | Status: DC | PRN
Start: 2013-11-27 — End: 2014-01-08

## 2013-11-27 NOTE — Progress Notes (Signed)
Subjective:    Patient ID: [name], female    DOB: 1960-11-11, 53 y.o.   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 PCCM initial consult during hospitalization   08/05/2013 [facility] follow up  pt returns for a post hospital follow up .  She had a prolonged hospital admission .  Presented  with sudden onset shortness of breath & cough with associated hemoptysis and significant hypoxia.  CT of the chest w/  diffuse bilateral airspace opacities.  Serology showed a P ANCA of 1:640, Myeloperoxidase greater than 8. Acute respiratory failure thought related to [facility] from microscopic polyangiitis. It was felt that she would not tolerate lung bx given high O2 demands and she was treated with empiric abx, high dose steroids, weekly rituxan and plasmapheresis x 7 with slow improvement. 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 She was tx w/  atovaquone for PCP prophylaxis. Bacterial / Viral cultures negative. Course was complicated by anemia in setting alveolar hemorrhage, improved on iron supplementation. She was seen by Rheumatology w/ OP follow up set up.  She was very deconditioned and discharged to rehab .  Since discharge she remains very weak. Says she is able to walk with walker but gets very tired.  She says she was seen by Rheumatology last week and   Started on Imuran on 07/31/13 .  Remains on prednisone 40mg  daily  Records requested.  Reports will be discharged from [place] on 7/30.   No further hemoptysis.    OV 09/02/2013  Chief Complaint  Patient presents with  . Follow-up    Pt c/o fatigue and SOB. Pt c/o cough and unable to bring mucous up. Pt states when she blows her nose it is brown in color and thick mucous.     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 She is being maintained on immuran. All cytotoxics through Dr Tobie Lords of rheumatology. She reports continued PT at home. SHe is not using walker as much. Reporting less dyspnea; able to do more without o2. She is NOT monitoring her o2 with pulse oximeter. She only decides on it on subjective basis but is mostly compliant with 4L [place]. She is frustrated by weight gain  CXR end jyly 2015: still with infiltrates but obesity obscuring picture  Labs end July 2015: creat normal but still anemiac at 10gm%.    Walking tes on Room air - after being on room air for 10 minutes - desaturated to 88% at 185 feet x 1 lap and got dyspneic as well   No results found for this basename: HGB, HCT, WBC, PLT,  in the last 168 hours   Recent Labs Lab 09/02/13 1554  NA 140  K 3.7  CL 97  CO2 28  GLUCOSE 183*  BUN 27*  CREATININE 0.9  CALCIUM 9.0    Dg Chest 2 View  09/02/2013   CLINICAL DATA:  Shortness of breath. Substernal pressure. Pulmonary alveolar hemorrhage.  EXAM: CHEST  2 VIEW  COMPARISON:  Chest x-rays dated 08/05/2013, 07/10/2013, 07/04/2013 and 06/30/2013 and chest CT dated 07/06/2013  FINDINGS: Heart size and pulmonary vascularity are are normal. Azygos vein remains prominent. Left lung has  cleared. There is minimal residual haziness in the right lung base. No appreciable effusions.  IMPRESSION: Complete clearing of the left lung. Minimal residual haziness at the right base.   Electronically Signed   By: Rozetta Nunnery M.D.   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 Follow-up    Pt states her breathing has improved since last OV. Pt c/o DOE but states she is able to be at RA at rest. Pt states she has prod cough with brown mucus and chest heaviness with active and lying down.    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 She continues to have exertional hypoxemia according to her history. She says that when she exerts and does anything beyond basic activities of daily living her chest gets tight and "r heart is ready to jump out" and she gets dyspneic and hypoxemic with pulse ox dropping to 82%. She is frustrated. She feels that by now hypoxemia should have resolved. In terms of her vasculitis she continues with prednisone and Imuran. She is also frustrated due to the immense weight gain. Currently at 384 pounds although a few weeks ago she was as high as 395 pounds. Current body mass index is 56 and was 53 at last visit. In addition she continues to have several somatic complaints including a general feeling of unwellness for the last week along with some brown sputum and some chest tightness. She thinks she might be coming down with a cold. Hwoever, she does NOT want antibiotics  In addition, she isStressed about social financial situation. On one hand she wants to go back to work too on an income. However the work is at [employer] and involves a lot of walking. Currently her vasculitis and obesity prevent her from doing that. On the other hand she does qualify for disability in my opinion but she is afraid to file for it because that'll not overly involved permanent disability but also maintained reduced income. In addition she is fearful that her disability application might be rejected  Walking desaturation test on room air 185 feet 3 laps: DID  NOT DESATURATE . THIS IS AN IMPROVEMENT   11/27/2013 Follow up  Chronic respiratory failure due to ANCA  vasculitis Returns for  2 week follow up.  Patient underwent a CT chest high resolution that showed significant improvement. With only mild parenchymal scarring , felt secondary to previously seen diffuse pulmonary groundglass and consolidation in June 2015 CT scan. Remains on Imuran and prednisone 10mg  per Ouida Sills.   Does complain of more dyspnea and tightness over last week . Increased after exposed exposed to fumes at home 10days ago that has irritated breathing.   No fever, hemoptysis , chest pain,orthopnea, or edema.   Does not feel she is able to go back to work . Gets to out of breath easily with minimal activity . Works part time . No benefits or payment. Has insurance with husband.  Walk test today in office with no desats but unable to walk 3 laps due to fatigue and dizziness. HR was ok, no sign elevation.  Wt is up 60 lbs over last 6 months .   Wants refill of cough med for nighttime cough.     Review of Systems  Constitutional: Negative for fever and unexpected weight change.  HENT: Negative for congestion, dental problem, ear pain, nosebleeds, postnasal drip, rhinorrhea, sinus pressure, sneezing, sore throat and trouble swallowing.   Eyes: Negative for redness and itching.  Respiratory: Positive for cough and shortness of breath. Negative for chest tightness and wheezing.   Cardiovascular: Negative for palpitations and leg swelling.  Gastrointestinal: Negative for nausea and vomiting.  Genitourinary: Negative for dysuria.  Musculoskeletal: Negative for joint swelling.  Skin: Negative for rash.  Neurological: Negative for headaches.  Hematological: Does not bruise/bleed easily.  Psychiatric/Behavioral: Negative for dysphoric mood. The patient is not nervous/anxious.        Objective:   Physical Exam GEN: A/Ox3; pleasant , NAD, obese   HEENT:  /AT,   EACs-clear, TMs-wnl, NOSE-clear, THROAT-clear, no lesions, no postnasal drip or exudate noted.   NECK:  Supple w/ fair ROM; no JVD; normal carotid impulses w/o bruits; no thyromegaly or nodules palpated; no lymphadenopathy.  RESP  Clear  P & A; w/o, wheezes/ rales/ or rhonchi.no accessory muscle use, no dullness to percussion  CARD:  RRR, no m/r/g  , no peripheral edema, pulses intact, no cyanosis or clubbing.  GI:   Soft & nt; nml bowel sounds; no organomegaly or masses detected.  Musco: Warm bil, no deformities or joint swelling noted.   Neuro: alert, no focal deficits noted.    Skin: Warm, no lesions or rashes

## 2013-11-27 NOTE — Assessment & Plan Note (Addendum)
No desats w/ walking.  Check ONO  Dyspnea persists- will need PFT  I  suspect dyspnea is multifactoral with morbid obesity and significant wt gain (>60 lbs over last 6 mnths ) suspect deconditioning is contributing.

## 2013-11-27 NOTE — Patient Instructions (Signed)
May stop Oxygen with activity  We will set you up for a overnight oximetry test on room air.  follow up Dr. Chase Caller in 6- 8  weeks with PFT

## 2013-11-27 NOTE — Assessment & Plan Note (Addendum)
Repeat CT-HR is much improved  No desats with ambulation  May d/c O2 with act  Check ONO  Will need PFT w/ persistent PFT

## 2013-11-28 NOTE — Progress Notes (Signed)
Quick Note:  Pt had appt with TP on 11/19. Results were discussed. Nothing further needed. ______

## 2013-12-10 ENCOUNTER — Telehealth: Payer: Self-pay | Admitting: Adult Health

## 2013-12-10 DIAGNOSIS — J9611 Chronic respiratory failure with hypoxia: Secondary | ICD-10-CM

## 2013-12-10 NOTE — Telephone Encounter (Signed)
  Patient Instructions     May stop Oxygen with activity  We will set you up for a overnight oximetry test on room air.  follow up Dr. Chase Caller in 6- 8 weeks with PFT     I have placed order for ONO-unsure why this was not done. Requested PCC's to take care of this first thing in AM. Will go through [facility].

## 2013-12-10 NOTE — Telephone Encounter (Signed)
Pt is aware.  

## 2013-12-30 ENCOUNTER — Telehealth: Payer: Self-pay | Admitting: Adult Health

## 2013-12-30 NOTE — Telephone Encounter (Signed)
Pt returned call Advised of ONO results Pt reports she is sick now w/ chest congestion, prod cough, wheezing and increased SOB x2 days - denies f/c/s, n/v/d, hemoptysis Pt does not believe that she can go without her O2 as she reports her sats drop at home to the 70s (did not desat w/ activity here in the office) Offered appt this week with MW but pt declined d/t previous obligations Appt scheduled with TP 01/08/14 @ 11am - pt advised to take mucinex dm BID prn for cough/congestion, fluids/rest.  She is aware to call back sooner if needed for worsening symptoms

## 2013-12-30 NOTE — Telephone Encounter (Signed)
ONO results received to see if pt still needs oxygen at bedtime Per TP: neg ONO, may d/c O2  LMOM TCB x1.  ONO sent for scan

## 2013-12-31 NOTE — Telephone Encounter (Signed)
Patient calling to speak to Janett Billow stating it is very important.  No other info given.  Patient sounded upset.  [phone]

## 2013-12-31 NOTE — Telephone Encounter (Signed)
Pt returning call.[name] ° °

## 2013-12-31 NOTE — Telephone Encounter (Signed)
Pt states that she does not feel like she can wait until 01/08/2014 to be seen. Pt is very anxious and upset on the phone. Requests OV with TP or MW 12/28-advised pt that TP is off that day and MW is 100% booked. Offered appt today and tomorrow, pt refused. Advised pt that if symptoms have worsened and she feels that she is in any distress, she needs to seek help at ED. Pt expressed understanding but states that she still wants to talk to Janett Billow and see if she can help her out. I advised the patient that I would pass this message off to Janett Billow to call her.

## 2013-12-31 NOTE — Telephone Encounter (Signed)
lmtcb for pt.  

## 2013-12-31 NOTE — Telephone Encounter (Signed)
LMOM TCB x1 Pt has limited options with the holiday, as discussed with her yesterday.  MW does not have any openings now until 12/29 but [facility] has openings tomorrow.  Otherwise she will need to go to the ER as discussed earlier today if her symptoms worsen.

## 2014-01-08 ENCOUNTER — Ambulatory Visit (INDEPENDENT_AMBULATORY_CARE_PROVIDER_SITE_OTHER): Payer: 59 | Admitting: Adult Health

## 2014-01-08 ENCOUNTER — Ambulatory Visit (INDEPENDENT_AMBULATORY_CARE_PROVIDER_SITE_OTHER)
Admission: RE | Admit: 2014-01-08 | Discharge: 2014-01-08 | Disposition: A | Discharge status: Home / Self Care | Payer: 59 | Source: Ambulatory Visit | Attending: Adult Health | Admitting: Adult Health

## 2014-01-08 ENCOUNTER — Encounter: Payer: Self-pay | Admitting: Adult Health

## 2014-01-08 VITALS — BP 124/84 | HR 95 | Temp 97.8°F | Ht 66.0 in | Wt 378.8 lb

## 2014-01-08 DIAGNOSIS — J9611 Chronic respiratory failure with hypoxia: Secondary | ICD-10-CM

## 2014-01-08 DIAGNOSIS — J209 Acute bronchitis, unspecified: Secondary | ICD-10-CM

## 2014-01-08 MED ORDER — HYDROCOD POLST-CHLORPHEN POLST 10-8 MG/5ML PO LQCR: 5.0000 mL | Freq: Two times a day (BID) | ORAL | Status: DC | PRN

## 2014-01-08 MED ORDER — PREDNISONE 10 MG PO TABS: ORAL_TABLET | ORAL | Status: DC

## 2014-01-08 MED ORDER — AMOXICILLIN-POT CLAVULANATE 875-125 MG PO TABS: 1.0000 | ORAL_TABLET | Freq: Two times a day (BID) | ORAL | Status: AC

## 2014-01-08 NOTE — Assessment & Plan Note (Signed)
Flare with probable sinusitis  Check cxr  Restart o2 during flare    Plan  Augmentin 875mg  Twice daily  For 10 days  Prednisone taper over next week-taper back to 10mg  daily -hold at this dose.  Saline nasal rinses As needed   Wear Oxygen 3 l/m with activity and 2l/m at rest  Follow up Dr. Chase Caller in 2 weeks as planned and As needed   Please contact office for sooner follow up if symptoms do not improve or worsen or seek emergency care

## 2014-01-08 NOTE — Patient Instructions (Addendum)
Augmentin 875mg  Twice daily  For 10 days  Prednisone taper over next week-taper back to 10mg  daily -hold at this dose.  Saline nasal rinses As needed   Wear Oxygen 3 l/m with activity and 2l/m at rest  Follow up Dr. Chase Caller in 2 weeks as planned and As needed   Please contact office for sooner follow up if symptoms do not improve or worsen or seek emergency care

## 2014-01-08 NOTE — Progress Notes (Signed)
Subjective:    Patient ID: [name], female    DOB: [birth year], 53 y.o.   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 PCCM initial consult during hospitalization   08/05/2013 [facility] follow up  pt returns for a post hospital follow up .  She had a prolonged hospital admission .  Presented  with sudden onset shortness of breath & cough with associated hemoptysis and significant hypoxia.  CT of the chest w/  diffuse bilateral airspace opacities.  Serology showed a P ANCA of 1:640, Myeloperoxidase greater than 8. Acute respiratory failure thought related to [facility] from microscopic polyangiitis. It was felt that she would not tolerate lung bx given high O2 demands and she was treated with empiric abx, high dose steroids, weekly rituxan and plasmapheresis x 7 with slow improvement. 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 She was tx w/  atovaquone for PCP prophylaxis. Bacterial / Viral cultures negative. Course was complicated by anemia in setting alveolar hemorrhage, improved on iron supplementation. She was seen by Rheumatology w/ OP follow up set up.  She was very deconditioned and discharged to rehab .  Since discharge she remains very weak. Says she is able to walk with walker but gets very tired.  She says she was seen by Rheumatology last week and   Started on Imuran on 07/31/13 .  Remains on prednisone 40mg  daily  Records requested.  Reports will be discharged from [place] on 7/30.   No further hemoptysis.    OV 09/02/2013  Chief Complaint  Patient presents with  . Follow-up    Pt c/o fatigue and SOB. Pt c/o cough and unable to bring mucous up. Pt states when she blows her nose it is brown in color and thick mucous.     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 She is being maintained on immuran. All cytotoxics through Dr Tobie Lords of rheumatology. She reports continued PT at home. SHe is not using walker as much. Reporting less dyspnea; able to do more without o2. She is NOT monitoring her o2 with pulse oximeter. She only decides on it on subjective basis but is mostly compliant with 4L [place]. She is frustrated by weight gain  CXR end jyly 2015: still with infiltrates but obesity obscuring picture  Labs end July 2015: creat normal but still anemiac at 10gm%.    Walking tes on Room air - after being on room air for 10 minutes - desaturated to 88% at 185 feet x 1 lap and got dyspneic as well   No results found for this basename: HGB, HCT, WBC, PLT,  in the last 168 hours   Recent Labs Lab 09/02/13 1554  NA 140  K 3.7  CL 97  CO2 28  GLUCOSE 183*  BUN 27*  CREATININE 0.9  CALCIUM 9.0    Dg Chest 2 View  09/02/2013   CLINICAL DATA:  Shortness of breath. Substernal pressure. Pulmonary alveolar hemorrhage.  EXAM: CHEST  2 VIEW  COMPARISON:  Chest x-rays dated 08/05/2013, 07/10/2013, 07/04/2013 and 06/30/2013 and chest CT dated 07/06/2013  FINDINGS: Heart size and pulmonary vascularity are are normal. Azygos vein remains prominent. Left lung has  cleared. There is minimal residual haziness in the right lung base. No appreciable effusions.  IMPRESSION: Complete clearing of the left lung. Minimal residual haziness at the right base.   Electronically Signed   By: Rozetta Nunnery M.D.   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 Follow-up    Pt states her breathing has improved since last OV. Pt c/o DOE but states she is able to be at RA at rest. Pt states she has prod cough with brown mucus and chest heaviness with active and lying down.    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 She continues to have exertional hypoxemia according to her history. She says that when she exerts and does anything beyond basic activities of daily living her chest gets tight and "r heart is ready to jump out" and she gets dyspneic and hypoxemic with pulse ox dropping to 82%. She is frustrated. She feels that by now hypoxemia should have resolved. In terms of her vasculitis she continues with prednisone and Imuran. She is also frustrated due to the immense weight gain. Currently at 384 pounds although a few weeks ago she was as high as 395 pounds. Current body mass index is 56 and was 53 at last visit. In addition she continues to have several somatic complaints including a general feeling of unwellness for the last week along with some brown sputum and some chest tightness. She thinks she might be coming down with a cold. Hwoever, she does NOT want antibiotics  In addition, she isStressed about social financial situation. On one hand she wants to go back to work too on an income. However the work is at [employer] and involves a lot of walking. Currently her vasculitis and obesity prevent her from doing that. On the other hand she does qualify for disability in my opinion but she is afraid to file for it because that'll not overly involved permanent disability but also maintained reduced income. In addition she is fearful that her disability application might be rejected  Walking desaturation test on room air 185 feet 3 laps: DID  NOT DESATURATE . THIS IS AN IMPROVEMENT   11/27/13 Follow up  Chronic respiratory failure due to ANCA  vasculitis Returns for  2 week follow up.  Patient underwent a CT chest high resolution that showed significant improvement. With only mild parenchymal scarring , felt secondary to previously seen diffuse pulmonary groundglass and consolidation in June 2015 CT scan. Remains on Imuran and prednisone 10mg  per Ouida Sills.   Does complain of more dyspnea and tightness over last week . Increased after exposed exposed to fumes at home 10days ago that has irritated breathing.   No fever, hemoptysis , chest pain,orthopnea, or edema.   Does not feel she is able to go back to work . Gets to out of breath easily with minimal activity . Works part time . No benefits or payment. Has insurance with husband.  Walk test today in office with no desats but unable to walk 3 laps due to fatigue and dizziness. HR was ok, no sign elevation.  Wt is up 60 lbs over last 6 months .   Wants refill of cough med for nighttime cough.  >>ONO set up   01/08/2014 Acute OV  Complains of sinus pressure, drainage, chest congestion, more dyspnea, cough and wheezing for 1 week.  Tried to  take Mucinex made her vomit.  Recent ONO showed no sign desats.  Last ov no ambulatory desats.  Has been wearing O2 for last week which helps.  Seems to have gotten worse after broken tooth , required tooth extraction and root canal. No abx given.  She denies any hemoptysis, chest pain, orthopnea, PND, or increased leg swelling.    Review of Systems  Constitutional: Negative for fever and unexpected weight change.  HENT: Negative for congestion, dental problem, ear pain, nosebleeds,  +postnasal drip, rhinorrhea, sinus pressure, sneezing, sore throat and trouble swallowing.   Eyes: Negative for redness and itching.  Respiratory: Positive for cough and shortness of breath. Negative for chest tightness and wheezing.   Cardiovascular:  Negative for palpitations and leg swelling.  Gastrointestinal: Negative for nausea and vomiting.  Genitourinary: Negative for dysuria.  Musculoskeletal: Negative for joint swelling.  Skin: Negative for rash.  Neurological: Negative for headaches.  Hematological: Does not bruise/bleed easily.  Psychiatric/Behavioral: Negative for dysphoric mood. The patient is not nervous/anxious.        Objective:   Physical Exam GEN: A/Ox3; pleasant , NAD, obese   HEENT:  [place]/AT,  EACs-clear, TMs-wnl, NOSE-clear drainage, max tenderness  THROAT-clear, no lesions, no postnasal drip or exudate noted.   NECK:  Supple w/ fair ROM; no JVD; normal carotid impulses w/o bruits; no thyromegaly or nodules palpated; no lymphadenopathy.  RESP  Few tr wheezes no accessory muscle use, no dullness to percussion  CARD:  RRR, no m/r/g  , no peripheral edema, pulses intact, no cyanosis or clubbing.  GI:   Soft & nt; nml bowel sounds; no organomegaly or masses detected.  Musco: Warm bil, no deformities or joint swelling noted.   Neuro: alert, no focal deficits noted.    Skin: Warm, no lesions or rashes

## 2014-01-12 NOTE — Telephone Encounter (Signed)
Pt seen 12/31 w/ TP Pt has upcoming appt w/ MR and PFT 01/22/14  Will sign off

## 2014-01-21 ENCOUNTER — Other Ambulatory Visit: Payer: Self-pay | Admitting: Internal Medicine

## 2014-01-21 DIAGNOSIS — R0600 Dyspnea, unspecified: Secondary | ICD-10-CM

## 2014-01-22 ENCOUNTER — Ambulatory Visit (INDEPENDENT_AMBULATORY_CARE_PROVIDER_SITE_OTHER): Payer: 59 | Admitting: Internal Medicine

## 2014-01-22 ENCOUNTER — Encounter: Payer: Self-pay | Admitting: Internal Medicine

## 2014-01-22 VITALS — BP 148/82 | HR 97 | Temp 99.2°F | Ht 68.0 in | Wt 378.0 lb

## 2014-01-22 DIAGNOSIS — J849 Interstitial pulmonary disease, unspecified: Secondary | ICD-10-CM | POA: Diagnosis not present

## 2014-01-22 DIAGNOSIS — J9611 Chronic respiratory failure with hypoxia: Secondary | ICD-10-CM

## 2014-01-22 DIAGNOSIS — R0600 Dyspnea, unspecified: Secondary | ICD-10-CM

## 2014-01-22 DIAGNOSIS — I776 Arteritis, unspecified: Secondary | ICD-10-CM | POA: Diagnosis not present

## 2014-01-22 DIAGNOSIS — I7782 Antineutrophilic cytoplasmic antibody (ANCA) vasculitis: Secondary | ICD-10-CM

## 2014-01-22 HISTORY — DX: Interstitial pulmonary disease, unspecified: J84.9

## 2014-01-22 LAB — PULMONARY FUNCTION TEST
DL/VA % PRED: 96 %
DL/VA: 5.06 ml/min/mmHg/L
DLCO UNC: 19.07 ml/min/mmHg
DLCO unc % pred: 64 %
FEF 25-75 POST: 1.89 L/s
FEF 25-75 Pre: 2.25 L/sec
FEF2575-%CHANGE-POST: -16 %
FEF2575-%PRED-PRE: 78 %
FEF2575-%Pred-Post: 65 %
FEV1-%Change-Post: -5 %
FEV1-%PRED-PRE: 58 %
FEV1-%Pred-Post: 55 %
FEV1-POST: 1.73 L
FEV1-Pre: 1.82 L
FEV1FVC-%Change-Post: 5 %
FEV1FVC-%Pred-Pre: 107 %
FEV6-%Change-Post: -9 %
FEV6-%PRED-PRE: 55 %
FEV6-%Pred-Post: 49 %
FEV6-Post: 1.91 L
FEV6-Pre: 2.12 L
FEV6FVC-%PRED-POST: 103 %
FEV6FVC-%PRED-PRE: 103 %
FVC-%Change-Post: -9 %
FVC-%Pred-Post: 48 %
FVC-%Pred-Pre: 53 %
FVC-POST: 1.91 L
FVC-Pre: 2.12 L
POST FEV1/FVC RATIO: 91 %
Post FEV6/FVC ratio: 100 %
Pre FEV1/FVC ratio: 86 %
Pre FEV6/FVC Ratio: 100 %

## 2014-01-22 NOTE — Progress Notes (Signed)
PFT done today. 

## 2014-01-22 NOTE — Patient Instructions (Addendum)
ICD-9-CM ICD-10-CM   1. Chronic respiratory failure with hypoxia 518.83 J96.11    799.02    2. 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 Morbid obesity 278.01 E66.01   4. ILD (interstitial lung disease) 515 J84.9     IT appears you are as stable as you can be from a lung standpoint Coontinue, immuran and prednisone per Dr Ouida Sills Continue o2 for now - ensure you have pulse ox monitor at home and keep pulse ox > 88%  Continue to lose weight REfer pulmonary rehab  FOllowup HRCT chest in 3 months  Walk test in office on Room Air at followup in 3 months DR Chase Caller in 3 months

## 2014-01-22 NOTE — Progress Notes (Signed)
Subjective:    Patient ID: [name], female    DOB: Sep 01, 1960, 54 y.o.   MRN: [id number]  HPI  OV 01/22/2014  Chief Complaint  Patient presents with  . Follow-up    Pt c/o increase in fatigue, DOE. Pt stated her cough has decreased since last OV.     Follow-up anca vasculitis, Pulmonary hge, ILD, chronic resp failure   Last seen by myself in October 2015. At that time she was subjectively still dyspneic and requiring oxygen but when we walked her 185 feet 3 laps on room air she did not desaturate. Subsequently she did follow up with my nurse practitioner and was instructed to stop her oxygen. Was end of the year 2015 to she did have some sinus infection and saw nurse practitioner. She says that she is unable to come off the oxygen and she feels that it helps with her dyspnea which is class III on exertion relieved by rest. She also reports pulmonary congestion all the time. She did have a chest x-ray December 2015 that is reported officially has interstitial pulmonary edema but it could easily be her obesity clouding pulmonary findings. She insists that she needs the oxygen and she cannot live without it. She continues with immunomodulators Imuran and prednisone with Dr. Tobie Lords   Pulmonaryfunction test today 01/22/2014 shows an FVC of 1.9 L/48%, FEV1 of 1.7 L/55%, ratio of 91/113%. Total lung capacity is 3.5 L/60%. DLCO is 19/64%. Overall restriction with low diffusion capacity in the context of Body mass index is 57.49 kg/(m^2).   CT scan of the chest 11/18/2013: Show significant improvement  CXR dec 2015 - pulm congestion v obesity  Review of Systems  Constitutional: Negative for fever and unexpected weight change.  HENT: Negative for congestion, dental problem, ear pain, nosebleeds, postnasal drip, rhinorrhea, sinus pressure, sneezing, sore throat and trouble swallowing.   Eyes: Negative for redness and itching.  Respiratory: Positive for cough and shortness of  breath. Negative for chest tightness and wheezing.   Cardiovascular: Negative for palpitations and leg swelling.  Gastrointestinal: Negative for nausea and vomiting.  Genitourinary: Negative for dysuria.  Musculoskeletal: Negative for joint swelling.  Skin: Negative for rash.  Neurological: Negative for headaches.  Hematological: Does not bruise/bleed easily.  Psychiatric/Behavioral: Negative for dysphoric mood. The patient is not nervous/anxious.     Current outpatient prescriptions:  .  ALPRAZolam (XANAX) 1 MG tablet, Take 1 tablet (1 mg total) by mouth 3 (three) times daily as needed for anxiety., Disp: 90 tablet, Rfl: 0 .  azaTHIOprine (IMURAN) 50 MG tablet, Take 150 mg by mouth daily. , Disp: , Rfl:  .  buPROPion (WELLBUTRIN XL) 300 MG 24 hr tablet, Take 300 mg by mouth daily. , Disp: , Rfl:  .  chlorpheniramine-HYDROcodone (TUSSIONEX) 10-8 MG/5ML LQCR, Take 5 mLs by mouth every 12 (twelve) hours as needed for cough., Disp: 120 mL, Rfl: 0 .  citalopram (CELEXA) 40 MG tablet, Take 40 mg by mouth daily., Disp: , Rfl:  .  dextromethorphan (DELSYM) 30 MG/5ML liquid, Take by mouth as needed for cough., Disp: , Rfl:  .  furosemide (LASIX) 40 MG tablet, Take 1 tablet by mouth daily., Disp: , Rfl:  .  omeprazole (PRILOSEC) 20 MG capsule, Take 20 mg by mouth daily., Disp: , Rfl:  .  polyethylene glycol (MIRALAX / GLYCOLAX) packet, Take 17 g by mouth at bedtime. , Disp: , Rfl:  .  predniSONE (DELTASONE) 5 MG tablet, Take 10 mg by  mouth daily with breakfast., Disp: , Rfl:       Objective:   Physical Exam   Filed Vitals:   01/22/14 1617  BP: 148/82  Pulse: 97  Temp: 99.2 F (37.3 C)  TempSrc: Oral  Height: 5\' 8"  (1.727 m)  Weight: 378 lb (171.46 kg)  SpO2: 98%        Assessment & Plan:  Chronic respiratory failure with hypoxia - Plan: AMB referral to Rehabilitation, CT Chest High Resolution  ANCA-associated vasculitis  Morbid obesity  ILD (interstitial lung disease) - Plan:  AMB referral to Rehabilitation, CT Chest High Resolution  I actually think that her interstitial lung disease and chronic respiratory failure due to ankle vasculitis is probably under remission. The oxygen use currently is likely subjective. The pulmonary congestion seen on chest x-ray could easily be explained by her obesity. Upon return function test today shows 60% lung function and this could be explained by obesity. Patient somewhat does not seem to accept this possibility as being high. Certainly we cannot exclude some mild amount of interstitial lung disease being present or diastolic dysfunction being present. At this point in time she is reluctant to even test herself without the oxygen  I recommended that she continue Imuran and prednisone per day Dr. Michael Litter her pulmonary findings in 3 months with a CT scan of the chest and walk test on room air Refer her to pulmonary rehabilitation for dyspnea which should help immensely Recommended continued weight loss   > 50% of this > 25 min visit spent in face to face counseling or coordination of care (15 min visit converted to 25 min)   Dr. Brand Males, M.D., [facility].C.P Pulmonary and Critical Care Medicine Staff Physician [facility] Pager: [phone], If no answer or between  15:00h - 7:00h: call [phone]  01/22/2014 5:07 PM

## 2014-02-02 ENCOUNTER — Telehealth (HOSPITAL_COMMUNITY): Payer: Self-pay

## 2014-02-02 NOTE — Telephone Encounter (Signed)
I have called and left a message with Bernadene to inquire about participation in Pulmonary Rehab. Will send letter in mail and follow up.

## 2014-02-09 ENCOUNTER — Telehealth (HOSPITAL_COMMUNITY): Payer: Self-pay

## 2014-02-09 NOTE — Telephone Encounter (Signed)
Called patient regarding entrance to Pulmonary Rehab.  Patient states that they are interested in attending the program.  [name] is going to verify insurance coverage and follow up.

## 2014-02-09 NOTE — Telephone Encounter (Signed)
I have called and left a second message with Zitlally to inquire about participation in Pulmonary Rehab.

## 2014-02-16 ENCOUNTER — Telehealth (HOSPITAL_COMMUNITY): Payer: Self-pay

## 2014-02-16 NOTE — Telephone Encounter (Signed)
Patient states she is interested in Pulmonary Rehab. Patient can not afford program but is interested in our maintenance program.  Patient states she will call me back later today to make orientation appointment.

## 2014-03-13 ENCOUNTER — Ambulatory Visit (HOSPITAL_COMMUNITY): Payer: 59

## 2014-03-20 ENCOUNTER — Ambulatory Visit (HOSPITAL_COMMUNITY): Payer: 59

## 2014-03-27 ENCOUNTER — Encounter (HOSPITAL_COMMUNITY): Payer: Self-pay

## 2014-03-27 ENCOUNTER — Encounter (HOSPITAL_COMMUNITY)
Admission: RE | Admit: 2014-03-27 | Discharge: 2014-03-27 | Disposition: A | Discharge status: Home / Self Care | Payer: 59 | Source: Ambulatory Visit | Attending: Internal Medicine | Admitting: Internal Medicine

## 2014-03-27 VITALS — BP 137/76 | HR 105 | Resp 18 | Ht 68.25 in | Wt 381.8 lb

## 2014-03-27 DIAGNOSIS — Z6841 Body Mass Index (BMI) 40.0 and over, adult: Secondary | ICD-10-CM | POA: Diagnosis not present

## 2014-03-27 DIAGNOSIS — J849 Interstitial pulmonary disease, unspecified: Secondary | ICD-10-CM | POA: Insufficient documentation

## 2014-03-27 DIAGNOSIS — I2781 Cor pulmonale (chronic): Secondary | ICD-10-CM | POA: Diagnosis not present

## 2014-03-27 DIAGNOSIS — Z79899 Other long term (current) drug therapy: Secondary | ICD-10-CM | POA: Insufficient documentation

## 2014-03-30 ENCOUNTER — Encounter: Payer: Self-pay | Admitting: Adult Health

## 2014-03-30 NOTE — Progress Notes (Signed)
[name] 54 y.o. female Pulmonary Rehab Orientation Note Patient arrived in Cardiac and Pulmonary Rehab for orientation to Pulmonary Rehab. She was transported from [employer] via wheel chair. She does carry portable oxygen. Per pt, she uses oxygen intermittently throughout the day. Color good, skin warm and dry. Patient is oriented to time and place. Patient's medical history and medications reviewed. Heart rate is tachycardic, breath sounds are diminished throughout related to her morbid obesity. Her BMI is 58. Grip strength equal, strong. Distal pulses palpable but faint. Patient reports she does take all medications as prescribed except her Lasix. She states she doesn't feel it works therefore she does not take it. Discussed with patient importance of diuretic and taking medications as prescribed by MD. Patient verbalized understanding and stated she would begin to take her lasix as prescribed. Patient states she follows a Regular diet. Patient states she has been prescribed phentermine for weight loss but took two doses and developed chest discomfort. She has not taken a dose since, but did not let her MD know about the chest discomfort. Patient instructed to not resume medication and to notify MD of side effects. Patient's weight will be monitored closely. Demonstration and practice of PLB using pulse oximeter. Patient able to return demonstration satisfactorily. Safety and hand hygiene in the exercise area reviewed with patient. Patient voices understanding of the information reviewed. Department expectations discussed with patient and achievable goals were set. The patient shows enthusiasm about attending the program and we look forward to working with this nice lady. The patient is scheduled for a 6 min walk test on Tuesday 3/22 and to begin exercise on Thursday 3/24 at 1330.   45 minutes was spent on a variety of activities such as assessment of the patient, obtaining baseline data including  height, weight, BMI, and grip strength, verifying medical history, allergies, and current medications, and teaching patient strategies for performing tasks with less respiratory effort with emphasis on pursed lip breathing.

## 2014-03-31 ENCOUNTER — Encounter (HOSPITAL_COMMUNITY)
Admission: RE | Admit: 2014-03-31 | Discharge: 2014-03-31 | Disposition: A | Discharge status: Home / Self Care | Payer: 59 | Source: Ambulatory Visit | Attending: Internal Medicine | Admitting: Internal Medicine

## 2014-03-31 DIAGNOSIS — J849 Interstitial pulmonary disease, unspecified: Secondary | ICD-10-CM | POA: Diagnosis not present

## 2014-03-31 NOTE — Progress Notes (Signed)
Levy completed a Six-Minute Walk Test on 03/31/14 . Endiya walked 753 feet with 1 break lasting 25 seconds.  The patient's lowest oxygen saturation was 9%5 , highest heart rate was 130bpm , and highest blood pressure was 120/72. The patient was on 3 liters of oxygen with a nasal cannula. [name] stated that over heating hindered her walk test.

## 2014-04-02 ENCOUNTER — Encounter (HOSPITAL_COMMUNITY): Payer: 59

## 2014-04-07 ENCOUNTER — Encounter (HOSPITAL_COMMUNITY)
Admission: RE | Admit: 2014-04-07 | Discharge: 2014-04-07 | Disposition: A | Discharge status: Home / Self Care | Payer: 59 | Source: Ambulatory Visit | Attending: Internal Medicine | Admitting: Internal Medicine

## 2014-04-07 DIAGNOSIS — J849 Interstitial pulmonary disease, unspecified: Secondary | ICD-10-CM | POA: Diagnosis not present

## 2014-04-07 NOTE — Progress Notes (Signed)
Today, [name] exercised at [employer]. [facility]. Service time was from 1330 to 1500.  The patient exercised by performing aerobic, strengthening, and stretching exercises. Oxygen saturation, heart rate, blood pressure, rate of perceived exertion, and shortness of breath were all monitored before, during, and after exercise. [name] presented with no problems at today's exercise session.  The patient did not have an increase in workload intensity during today's exercise session.  Pre-exercise vitals: . Weight kg: 173.4 . Liters of O2: 3 . SpO2: 98 . HR: 103 . BP: 158/90 . CBG: na  Exercise vitals: . Highest heartrate:  120 . Lowest oxygen saturation: 97 . Highest blood pressure: 138/86 . Liters of 02: 3  Post-exercise vitals: . SpO2: 99 . HR: 93 . BP: 126/60 . Liters of O2: 3 . CBG: na Dr. Brand Males, Medical Director Dr. Sheran Fava is immediately available during today's Pulmonary Rehab session for [name] on 04/07/2014 at 1330 class time.  Marland Kitchen

## 2014-04-09 ENCOUNTER — Encounter (HOSPITAL_COMMUNITY): Payer: 59

## 2014-04-14 ENCOUNTER — Encounter (HOSPITAL_COMMUNITY)
Admission: RE | Admit: 2014-04-14 | Discharge: 2014-04-14 | Disposition: A | Discharge status: Home / Self Care | Payer: 59 | Source: Ambulatory Visit | Attending: Internal Medicine | Admitting: Internal Medicine

## 2014-04-14 DIAGNOSIS — Z6841 Body Mass Index (BMI) 40.0 and over, adult: Secondary | ICD-10-CM | POA: Insufficient documentation

## 2014-04-14 DIAGNOSIS — I2781 Cor pulmonale (chronic): Secondary | ICD-10-CM | POA: Diagnosis not present

## 2014-04-14 DIAGNOSIS — J849 Interstitial pulmonary disease, unspecified: Secondary | ICD-10-CM | POA: Diagnosis not present

## 2014-04-14 DIAGNOSIS — Z79899 Other long term (current) drug therapy: Secondary | ICD-10-CM | POA: Diagnosis not present

## 2014-04-14 NOTE — Progress Notes (Signed)
Today, [name] exercised at [employer]. [facility]. Service time was from 1330 to 1500.  The patient exercised by performing aerobic, strengthening, and stretching exercises. Oxygen saturation, heart rate, blood pressure, rate of perceived exertion, and shortness of breath were all monitored before, during, and after exercise. Kineta presented with no problems at today's exercise session.  The patient did  have an increase in workload intensity during today's exercise session.  Pre-exercise vitals: . Weight kg: 174.4 . Liters of O2: 3L . SpO2: 98 . HR: 99 . BP: 112/72 . CBG: na  Exercise vitals: . Highest heartrate:  94 . Lowest oxygen saturation: 96 . Highest blood pressure: 136/80 . Liters of 02: 3L  Post-exercise vitals: . SpO2: 98 . HR: 88 . BP: 110/78 . Liters of O2: 3L . CBG: na  Dr. Brand Males, Medical Director Dr. Clementeen Graham is immediately available during today's Pulmonary Rehab session for [name] on 04/14/2014 at 1330 class time.

## 2014-04-16 ENCOUNTER — Inpatient Hospital Stay: Admission: RE | Admit: 2014-04-16 | Payer: 59 | Source: Ambulatory Visit

## 2014-04-16 ENCOUNTER — Encounter (HOSPITAL_COMMUNITY): Admission: RE | Admit: 2014-04-16 | Payer: 59 | Source: Ambulatory Visit

## 2014-04-21 ENCOUNTER — Telehealth (HOSPITAL_COMMUNITY): Payer: Self-pay | Admitting: *Deleted

## 2014-04-21 ENCOUNTER — Inpatient Hospital Stay: Admission: RE | Admit: 2014-04-21 | Payer: 59 | Source: Ambulatory Visit

## 2014-04-21 ENCOUNTER — Encounter (HOSPITAL_COMMUNITY): Admission: RE | Admit: 2014-04-21 | Payer: 59 | Source: Ambulatory Visit

## 2014-04-21 ENCOUNTER — Telehealth: Payer: Self-pay | Admitting: Internal Medicine

## 2014-04-21 NOTE — Telephone Encounter (Signed)
Noted  

## 2014-04-23 ENCOUNTER — Encounter (HOSPITAL_COMMUNITY)
Admission: RE | Admit: 2014-04-23 | Discharge: 2014-04-23 | Disposition: A | Discharge status: Home / Self Care | Payer: 59 | Source: Ambulatory Visit | Attending: Internal Medicine | Admitting: Internal Medicine

## 2014-04-23 DIAGNOSIS — J849 Interstitial pulmonary disease, unspecified: Secondary | ICD-10-CM | POA: Diagnosis not present

## 2014-04-23 NOTE — Progress Notes (Signed)
Today, Ronnesha exercised at [employer]. [facility]. Service time was from 1330 to 1515.  The patient exercised by performing aerobic, strengthening, and stretching exercises. Oxygen saturation, heart rate, blood pressure, rate of perceived exertion, and shortness of breath were all monitored before, during, and after exercise. Twilla presented with no problems at today's exercise session. Melah also attended an education session with respiratory therapy.  The patient did have an increase in workload intensity during today's exercise session.  Pre-exercise vitals: . Weight kg: 172.7 . Liters of O2: 3L . SpO2: 99 . HR: 108 . BP: 138/84 . CBG: na  Exercise vitals: . Highest heartrate:  95 . Lowest oxygen saturation: 96 . Highest blood pressure: 130/80 . Liters of 02: 3L  Post-exercise vitals: . SpO2: 98 . HR: 88 . BP: 116/76 . Liters of O2: 3L . CBG: na  Dr. Brand Males, Medical Director Dr. Wyline Copas is immediately available during today's Pulmonary Rehab session for MAESYN MOLLOY on 04/23/2014 at 1330 class time.

## 2014-04-24 ENCOUNTER — Ambulatory Visit: Payer: 59 | Admitting: Internal Medicine

## 2014-04-27 ENCOUNTER — Ambulatory Visit (INDEPENDENT_AMBULATORY_CARE_PROVIDER_SITE_OTHER)
Admission: RE | Admit: 2014-04-27 | Discharge: 2014-04-27 | Disposition: A | Discharge status: Home / Self Care | Payer: 59 | Source: Ambulatory Visit | Attending: Internal Medicine | Admitting: Internal Medicine

## 2014-04-27 DIAGNOSIS — J9611 Chronic respiratory failure with hypoxia: Secondary | ICD-10-CM

## 2014-04-27 DIAGNOSIS — J849 Interstitial pulmonary disease, unspecified: Secondary | ICD-10-CM

## 2014-04-28 ENCOUNTER — Encounter (HOSPITAL_COMMUNITY)
Admission: RE | Admit: 2014-04-28 | Discharge: 2014-04-28 | Disposition: A | Discharge status: Home / Self Care | Payer: 59 | Source: Ambulatory Visit | Attending: Internal Medicine | Admitting: Internal Medicine

## 2014-04-28 DIAGNOSIS — J849 Interstitial pulmonary disease, unspecified: Secondary | ICD-10-CM | POA: Diagnosis not present

## 2014-04-28 NOTE — Progress Notes (Signed)
Today, Darby exercised at [employer]. [facility]. Service time was from 1330 to 1515.  The patient exercised by performing aerobic, strengthening, and stretching exercises. Oxygen saturation, heart rate, blood pressure, rate of perceived exertion, and shortness of breath were all monitored before, during, and after exercise. Avalyn presented with no problems at today's exercise session.  The patient did  have an increase in workload intensity during today's exercise session.  Pre-exercise vitals: . Weight kg: 175.1 . Liters of O2: 3 . SpO2: 100 . HR: 103 . BP: 122/70 . CBG: na  Exercise vitals: . Highest heartrate:  107 . Lowest oxygen saturation: 98 . Highest blood pressure: 140/80 . Liters of 02: 3  Post-exercise vitals: . SpO2: 98 . HR: 98 . BP: 116/72 . Liters of O2: 3 . CBG: na Dr. Brand Males, Medical Director Dr. Wyline Copas is immediately available during today's Pulmonary Rehab session for [name] on 04/28/2014 at 1330 class time.  Marland Kitchen

## 2014-04-30 ENCOUNTER — Encounter (HOSPITAL_COMMUNITY): Payer: 59

## 2014-05-04 ENCOUNTER — Ambulatory Visit: Payer: 59 | Admitting: Internal Medicine

## 2014-05-05 ENCOUNTER — Encounter (HOSPITAL_COMMUNITY): Payer: 59

## 2014-05-07 ENCOUNTER — Encounter (HOSPITAL_COMMUNITY): Admission: RE | Admit: 2014-05-07 | Payer: 59 | Source: Ambulatory Visit

## 2014-05-11 ENCOUNTER — Ambulatory Visit (INDEPENDENT_AMBULATORY_CARE_PROVIDER_SITE_OTHER): Payer: 59 | Admitting: Internal Medicine

## 2014-05-11 ENCOUNTER — Encounter: Payer: Self-pay | Admitting: Internal Medicine

## 2014-05-11 ENCOUNTER — Other Ambulatory Visit (INDEPENDENT_AMBULATORY_CARE_PROVIDER_SITE_OTHER): Payer: 59

## 2014-05-11 VITALS — BP 118/80 | HR 93 | Ht 68.0 in | Wt 383.0 lb

## 2014-05-11 DIAGNOSIS — R5382 Chronic fatigue, unspecified: Secondary | ICD-10-CM

## 2014-05-11 DIAGNOSIS — R0600 Dyspnea, unspecified: Secondary | ICD-10-CM

## 2014-05-11 DIAGNOSIS — J9611 Chronic respiratory failure with hypoxia: Secondary | ICD-10-CM | POA: Diagnosis not present

## 2014-05-11 DIAGNOSIS — I7782 Antineutrophilic cytoplasmic antibody (ANCA) vasculitis: Secondary | ICD-10-CM

## 2014-05-11 DIAGNOSIS — D72829 Elevated white blood cell count, unspecified: Secondary | ICD-10-CM

## 2014-05-11 DIAGNOSIS — I776 Arteritis, unspecified: Secondary | ICD-10-CM

## 2014-05-11 HISTORY — DX: Dyspnea, unspecified: R06.00

## 2014-05-11 HISTORY — DX: Elevated white blood cell count, unspecified: D72.829

## 2014-05-11 HISTORY — DX: Chronic fatigue, unspecified: R53.82

## 2014-05-11 LAB — CBC
HCT: 38.8 % (ref 36.0–46.0)
HEMOGLOBIN: 12.7 g/dL (ref 12.0–15.0)
MCHC: 32.6 g/dL (ref 30.0–36.0)
MCV: 76 fl — ABNORMAL LOW (ref 78.0–100.0)
PLATELETS: 377 10*3/uL (ref 150.0–400.0)
RBC: 5.1 Mil/uL (ref 3.87–5.11)
RDW: 16.9 % — ABNORMAL HIGH (ref 11.5–15.5)
WBC: 18 10*3/uL (ref 4.0–10.5)

## 2014-05-11 LAB — BASIC METABOLIC PANEL
BUN: 19 mg/dL (ref 6–23)
CO2: 33 mEq/L — ABNORMAL HIGH (ref 19–32)
CREATININE: 0.86 mg/dL (ref 0.40–1.20)
Calcium: 9.7 mg/dL (ref 8.4–10.5)
Chloride: 98 mEq/L (ref 96–112)
GFR: 73.25 mL/min (ref >60.00)
GLUCOSE: 116 mg/dL — AB (ref 70–99)
Potassium: 4.5 mEq/L (ref 3.5–5.1)
SODIUM: 138 meq/L (ref 135–145)

## 2014-05-11 NOTE — Patient Instructions (Addendum)
ICD-9-CM ICD-10-CM   1. 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 Chronic respiratory failure with hypoxia 518.83 J96.11    799.02    3. Morbid obesity 278.01 E66.01   4. Dyspnea 786.09 R06.00   5. Leukocytosis 288.60 D72.829   6. Chronic fatigue 780.79 R53.82     IT appears you are as stable as you can be from a lung standpoint Coontinue, immuran and prednisone per Dr Helane Gunther do not need day time exertional O2  - please advise rehab to exercise you without O2 and monitor  - - ensure you have pulse ox monitor at home and keep pulse ox > 88%  Continue to lose weight Restart pulmonary rehab and stay compliant; only thing that will help with fatigue Do CBC, bmet blood work today - will call with results  FOllowup Walk test in office on Room Air at followup in 3 months DR Chase Caller in 3 months

## 2014-05-11 NOTE — Progress Notes (Signed)
Subjective:    Patient ID: [name], female    DOB: [birth year], 54 y.o.   MRN: [id number]  HPI    OV 01/22/2014  Chief Complaint  Patient presents with  . Follow-up    Pt c/o increase in fatigue, DOE. Pt stated her cough has decreased since last OV.     Follow-up anca vasculitis, Pulmonary hge, ILD, chronic resp failure   Last seen by myself in October 2015. At that time she was subjectively still dyspneic and requiring oxygen but when we walked her 185 feet 3 laps on room air she did not desaturate. Subsequently she did follow up with my nurse practitioner and was instructed to stop her oxygen. Was end of the year 2015 to she did have some sinus infection and saw nurse practitioner. She says that she is unable to come off the oxygen and she feels that it helps with her dyspnea which is class III on exertion relieved by rest. She also reports pulmonary congestion all the time. She did have a chest x-ray December 2015 that is reported officially has interstitial pulmonary edema but it could easily be her obesity clouding pulmonary findings. She insists that she needs the oxygen and she cannot live without it. She continues with immunomodulators Imuran and prednisone with Dr. Tobie Lords   Pulmonaryfunction test today 01/22/2014 shows an FVC of 1.9 L/48%, FEV1 of 1.7 L/55%, ratio of 91/113%. Total lung capacity is 3.5 L/60%. DLCO is 19/64%. Overall restriction with low diffusion capacity in the context of Body mass index is 57.49 kg/(m^2).   CT scan of the chest 11/18/2013: Show significant improvement  CXR dec 2015 - pulm congestion v obesity   OV 05/11/2014  Chief Complaint  Patient presents with  . Follow-up    Pt stated she has a lack of energy, c/o seasonal allergies-watery eyes, sinus pressure, PND, prod cough with yellow, and chest tightness.      Follow-up anca vasculitis, Pulmonary hge, ILD, chronic resp failure    Overall stable. She has been attending  pulmonary rehabilitation but she has been irregular with a visits. She tells me that she subjectively still needs oxygen. Objectively this was never tested on room air at rehabilitation which she tells me that at home she desaturates. Although walking desaturation test 185 feet 3 laps at last visit in gender 2016 did not show any desaturation and repeated again today - she completed only 2 laps. Rested x 2: never desaturated below 95%    ., ____, CT scan of the chest 04/26/2004 and personally reviewed shows largely clear lung fields and resolved interstitial lung disease  As always she has multitude of complaints 1. She's worried that Dr. Ouida Sills her rheumatologist is retiring in July 2016. I've advised her to follow's recommendation and see either Dr. Amil Amen or Dr. Gavin Pound within the practice  2. She also is worried about her chronically persistent high WBC count and is wondering whether she should get plasma pheresis. I've cautioned against this but will repeat her lab work  3. She complains of persistent fatigue  4. She's worried that her permanent disability application might not get approved. I recommended for this disease permanent disability  CT chest 04/27/14  - IMPRESSION: 1. Resolving post infectious or inflammatory scarring in the lingula. 2. No definitive findings to suggest interstitial lung disease on today's examination. 3. Mild air trapping, indicative of mild small airways disease.   Electronically Signed  By: Vinnie Langton M.D.  On: 04/28/2014  09:40     has a past medical history of Colitis; Obesity (BMI 30-39.9) (09/23/2012); Anxiety associated with depression (09/23/2012); tobacco use, presenting hazards to health (09/23/2012); At high risk for falls (09/23/2012); Diverticulitis of large intestine with perforation (09/23/2012); and GERD (gastroesophageal reflux disease).   reports that she quit smoking about 13 years ago. She has never used smokeless  tobacco.  Past Surgical History  Procedure Laterality Date  . Appendectomy    . Tonsillectomy    . Bladder repair      Allergies  Allergen Reactions  . Mucinex [Guaifenesin Er] Nausea And Vomiting  . Sulfa Antibiotics     There is no immunization history for the selected administration types on file for this patient.  Family History  Problem Relation Age of Onset  . Cancer Paternal Grandmother     breast     Current outpatient prescriptions:  .  ALPRAZolam (XANAX) 1 MG tablet, Take 1 tablet (1 mg total) by mouth 3 (three) times daily as needed for anxiety., Disp: 90 tablet, Rfl: 0 .  azaTHIOprine (IMURAN) 50 MG tablet, Take 150 mg by mouth daily. , Disp: , Rfl:  .  buPROPion (WELLBUTRIN XL) 300 MG 24 hr tablet, Take 300 mg by mouth daily. , Disp: , Rfl:  .  citalopram (CELEXA) 40 MG tablet, Take 40 mg by mouth daily., Disp: , Rfl:  .  dextromethorphan (DELSYM) 30 MG/5ML liquid, Take by mouth as needed for cough., Disp: , Rfl:  .  esomeprazole (NEXIUM) 40 MG capsule, Take 40 mg by mouth daily at 12 noon., Disp: , Rfl:  .  furosemide (LASIX) 40 MG tablet, Take 1 tablet by mouth daily., Disp: , Rfl:  .  polyethylene glycol (MIRALAX / GLYCOLAX) packet, Take 17 g by mouth at bedtime. , Disp: , Rfl:  .  predniSONE (DELTASONE) 5 MG tablet, Take 10 mg by mouth daily with breakfast., Disp: , Rfl:  .  phentermine 37.5 MG capsule, Take 37.5 mg by mouth every morning. Take 1/2 tablet BID to suppress appitite, Disp: , Rfl:     Review of Systems  Constitutional: Negative for fever and unexpected weight change.  HENT: Negative for congestion, dental problem, ear pain, nosebleeds, postnasal drip, rhinorrhea, sinus pressure, sneezing, sore throat and trouble swallowing.   Eyes: Negative for redness and itching.  Respiratory: Positive for cough, chest tightness and shortness of breath. Negative for wheezing.   Cardiovascular: Negative for palpitations and leg swelling.  Gastrointestinal:  Negative for nausea and vomiting.  Genitourinary: Negative for dysuria.  Musculoskeletal: Negative for joint swelling.  Skin: Negative for rash.  Neurological: Negative for headaches.  Hematological: Does not bruise/bleed easily.  Psychiatric/Behavioral: Negative for dysphoric mood. The patient is not nervous/anxious.        Objective:   Physical Exam  Constitutional: She is oriented to person, place, and time. She appears well-developed and well-nourished. No distress.  Body mass index is 58.25 kg/(m^2).   HENT:  Head: Normocephalic and atraumatic.  Right Ear: External ear normal.  Left Ear: External ear normal.  Mouth/Throat: Oropharynx is clear and moist. No oropharyngeal exudate.  hirsute  Eyes: Conjunctivae and EOM are normal. Pupils are equal, round, and reactive to light. Right eye exhibits no discharge. Left eye exhibits no discharge. No scleral icterus.  Neck: Normal range of motion. Neck supple. No JVD present. No tracheal deviation present. No thyromegaly present.  Cardiovascular: Normal rate, regular rhythm, normal heart sounds and intact distal pulses.  Exam reveals no gallop  and no friction rub.   No murmur heard. Pulmonary/Chest: Effort normal and breath sounds normal. No respiratory distress. She has no wheezes. She has no rales. She exhibits no tenderness.  Abdominal: Soft. Bowel sounds are normal. She exhibits no distension and no mass. There is no tenderness. There is no rebound and no guarding.  Musculoskeletal: Normal range of motion. She exhibits no edema or tenderness.  Lymphadenopathy:    She has no cervical adenopathy.  Neurological: She is alert and oriented to person, place, and time. She has normal reflexes. No cranial nerve deficit. She exhibits normal muscle tone. Coordination normal.  Skin: Skin is warm and dry. No rash noted. She is not diaphoretic. No erythema. No pallor.  Psychiatric:   poor historian Rambled speech  Vitals reviewed.   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 Chronic respiratory failure with hypoxia 518.83 J96.11 CBC   [id number]  Basic Metabolic Panel (BMET)  3. Morbid obesity 278.01 E66.01   4. Dyspnea 786.09 R06.00   5. Leukocytosis 288.60 D72.829 CBC  6. Chronic fatigue 780.79 R53.82 CBC     Basic Metabolic Panel (BMET)   #ANCA and resp failure  IT appears you are as stable as you can be from a lung standpoint Coontinue, immuran and prednisone per Dr Helane Gunther do not need day time exertional O2  - please advise rehab to exercise you without O2 and monitor  - - ensure you have pulse ox monitor at home and keep pulse ox > 88%   #obesity Continue to lose weight  #Fatigue and dyspnea Due to obesity, deconditioning and priopr critical illness Restart pulmonary rehab and stay compliant; only thing that will help with fatigue  #High wibc  Do CBC, bmet blood work today - will call with results Unclear why high. If still high might referr  heme  FOllowup Walk test in office on Room Air at followup in 3 months DR Chase Caller in 3 months   Dr. Brand Males, M.D., [facility].C.P Pulmonary and Critical Care Medicine Staff Physician [place] [facility] Pager: [phone], If no answer or between  15:00h - 7:00h: call [phone]  05/14/2014 10:54 AM

## 2014-05-12 ENCOUNTER — Encounter (HOSPITAL_COMMUNITY): Admission: RE | Admit: 2014-05-12 | Payer: 59 | Source: Ambulatory Visit

## 2014-05-13 ENCOUNTER — Telehealth: Payer: Self-pay | Admitting: Internal Medicine

## 2014-05-13 DIAGNOSIS — D72829 Elevated white blood cell count, unspecified: Secondary | ICD-10-CM

## 2014-05-13 NOTE — Telephone Encounter (Signed)
Called and spoke to pt. Informed pt of the results and recs per MR. Referral order placed. Pt verbalized understanding and denied any further questions or concerns at this time.

## 2014-05-13 NOTE — Telephone Encounter (Signed)
  Recent Labs Lab 05/11/14 1447  HGB 12.7  HCT 38.8  WBC 18.0 Repeated and verified X2.*  PLT 377.0    Wbc continues to be high.   P referr hematology/oncology for leukocytosis  Dr. Brand Males, M.D., [facility].C.P Pulmonary and Critical Care Medicine Staff Physician [place] [facility] Pager: [phone], If no answer or between  15:00h - 7:00h: call [phone]  05/13/2014 4:20 PM

## 2014-05-14 ENCOUNTER — Encounter (HOSPITAL_COMMUNITY): Admission: RE | Admit: 2014-05-14 | Payer: 59 | Source: Ambulatory Visit

## 2014-05-19 ENCOUNTER — Encounter (HOSPITAL_COMMUNITY)
Admission: RE | Admit: 2014-05-19 | Discharge: 2014-05-19 | Disposition: A | Discharge status: Home / Self Care | Payer: 59 | Source: Ambulatory Visit | Attending: Internal Medicine | Admitting: Internal Medicine

## 2014-05-19 DIAGNOSIS — Z79899 Other long term (current) drug therapy: Secondary | ICD-10-CM | POA: Diagnosis not present

## 2014-05-19 DIAGNOSIS — Z6841 Body Mass Index (BMI) 40.0 and over, adult: Secondary | ICD-10-CM | POA: Insufficient documentation

## 2014-05-19 DIAGNOSIS — J849 Interstitial pulmonary disease, unspecified: Secondary | ICD-10-CM | POA: Insufficient documentation

## 2014-05-19 DIAGNOSIS — I2781 Cor pulmonale (chronic): Secondary | ICD-10-CM | POA: Insufficient documentation

## 2014-05-19 NOTE — Progress Notes (Signed)
Today, [name] exercised at [employer]. [facility]. Service time was from 1330 to 1535.  The patient exercised by performing aerobic, strengthening, and stretching exercises. Oxygen saturation, heart rate, blood pressure, rate of perceived exertion, and shortness of breath were all monitored before, during, and after exercise. [name] presented with no problems at today's exercise session. [name] also attended an education session on anatomy and physiology of the pulmonary system and intimacy.  The patient did not have an increase in workload intensity during today's exercise session.  Pre-exercise vitals: . Weight kg: 173.7 . Liters of O2: ra . SpO2: 96 . HR: 94 . BP: 140/80 . CBG: na  Exercise vitals: . Highest heartrate:  124 . Lowest oxygen saturation: 94 . Highest blood pressure: 140/82 . Liters of 02: ra  Post-exercise vitals: . SpO2: 96 . HR: 90 . BP: 114/70 . Liters of O2: ra . CBG: na  Dr. Brand Males, Medical Director Dr. Algis Liming is immediately available during today's Pulmonary Rehab session for [name] on 05/19/2014 at 1330 class time.

## 2014-05-21 ENCOUNTER — Encounter (HOSPITAL_COMMUNITY): Payer: 59

## 2014-05-26 ENCOUNTER — Encounter (HOSPITAL_COMMUNITY): Admission: RE | Admit: 2014-05-26 | Payer: 59 | Source: Ambulatory Visit

## 2014-05-26 ENCOUNTER — Telehealth (HOSPITAL_COMMUNITY): Payer: Self-pay | Admitting: *Deleted

## 2014-05-28 ENCOUNTER — Encounter (HOSPITAL_COMMUNITY): Admission: RE | Admit: 2014-05-28 | Payer: 59 | Source: Ambulatory Visit

## 2014-06-02 ENCOUNTER — Encounter (HOSPITAL_COMMUNITY): Admission: RE | Admit: 2014-06-02 | Payer: 59 | Source: Ambulatory Visit

## 2014-06-03 ENCOUNTER — Telehealth: Payer: Self-pay | Admitting: Hematology & Oncology

## 2014-06-03 ENCOUNTER — Other Ambulatory Visit: Payer: 59

## 2014-06-03 ENCOUNTER — Other Ambulatory Visit: Payer: Self-pay | Admitting: Family

## 2014-06-03 ENCOUNTER — Ambulatory Visit: Payer: 59 | Admitting: Family

## 2014-06-03 ENCOUNTER — Ambulatory Visit: Payer: 59

## 2014-06-03 ENCOUNTER — Telehealth: Payer: Self-pay | Admitting: Family

## 2014-06-03 DIAGNOSIS — D72829 Elevated white blood cell count, unspecified: Secondary | ICD-10-CM

## 2014-06-03 NOTE — Telephone Encounter (Signed)
Contacted pt regarding rescheduling appt and Pt requested 6/8.  I informed pt that I would contact pt with confirmed appt the following day.

## 2014-06-03 NOTE — Telephone Encounter (Signed)
New pt fell and will not be able to make appt today.

## 2014-06-04 ENCOUNTER — Encounter (HOSPITAL_COMMUNITY): Admission: RE | Admit: 2014-06-04 | Payer: 59 | Source: Ambulatory Visit

## 2014-06-09 ENCOUNTER — Encounter (HOSPITAL_COMMUNITY): Payer: 59

## 2014-06-09 ENCOUNTER — Telehealth (HOSPITAL_COMMUNITY): Payer: Self-pay | Admitting: *Deleted

## 2014-06-10 ENCOUNTER — Telehealth: Payer: Self-pay | Admitting: Hematology & Oncology

## 2014-06-10 NOTE — Telephone Encounter (Signed)
Patient is aware of New Patient appt.  She knows the date/time and address.

## 2014-06-11 ENCOUNTER — Encounter (HOSPITAL_COMMUNITY): Admission: RE | Admit: 2014-06-11 | Payer: 59 | Source: Ambulatory Visit

## 2014-06-11 ENCOUNTER — Telehealth (HOSPITAL_COMMUNITY): Payer: Self-pay | Admitting: *Deleted

## 2014-06-15 ENCOUNTER — Telehealth: Payer: Self-pay | Admitting: Hematology & Oncology

## 2014-06-15 NOTE — Progress Notes (Signed)
Discharge Note from Pulmonary Rehab  [name] has been discharged from Pulmonary Rehab due to poor attendance.  She attended 5 exercise sessions between 04/07/2014 and 05/19/2014.  She did not meet any of her program goals which were weight loss and to increase her stamina, strength, and decrease her shortness of breath. She was aware that she needed this program, but was not able to attended due to multiple excuses.

## 2014-06-15 NOTE — Addendum Note (Signed)
Encounter addended by: Lance Morin, RN on: 06/15/2014 11:10 AM<BR>     Documentation filed: Notes Section

## 2014-06-15 NOTE — Telephone Encounter (Signed)
I spoke w NEW PATIENT today to remind them of their appointment with Dr. Ennever. Also, advised them to bring all medication bottles and insurance card information. ° °

## 2014-06-16 ENCOUNTER — Encounter (HOSPITAL_COMMUNITY): Payer: 59

## 2014-06-17 ENCOUNTER — Other Ambulatory Visit: Payer: Self-pay | Admitting: Family

## 2014-06-17 ENCOUNTER — Other Ambulatory Visit: Payer: 59

## 2014-06-17 ENCOUNTER — Telehealth: Payer: Self-pay | Admitting: Hematology & Oncology

## 2014-06-17 ENCOUNTER — Ambulatory Visit: Payer: 59

## 2014-06-17 ENCOUNTER — Ambulatory Visit: Payer: 59 | Admitting: Family

## 2014-06-17 DIAGNOSIS — D72829 Elevated white blood cell count, unspecified: Secondary | ICD-10-CM

## 2014-06-17 NOTE — Telephone Encounter (Signed)
Patient called and cx New Pt appt today due to car problems.  Rn was notified and she will call back to resch

## 2014-06-18 ENCOUNTER — Encounter (HOSPITAL_COMMUNITY): Payer: 59

## 2014-06-23 ENCOUNTER — Encounter (HOSPITAL_COMMUNITY): Payer: 59

## 2014-06-25 ENCOUNTER — Encounter (HOSPITAL_COMMUNITY): Payer: 59

## 2014-06-30 ENCOUNTER — Encounter (HOSPITAL_COMMUNITY): Payer: 59

## 2014-07-02 ENCOUNTER — Encounter (HOSPITAL_COMMUNITY): Payer: 59

## 2014-07-07 ENCOUNTER — Encounter (HOSPITAL_COMMUNITY): Payer: 59

## 2014-07-08 ENCOUNTER — Other Ambulatory Visit (HOSPITAL_BASED_OUTPATIENT_CLINIC_OR_DEPARTMENT_OTHER): Payer: 59

## 2014-07-08 ENCOUNTER — Ambulatory Visit (HOSPITAL_BASED_OUTPATIENT_CLINIC_OR_DEPARTMENT_OTHER): Payer: 59 | Admitting: Family

## 2014-07-08 ENCOUNTER — Encounter: Payer: Self-pay | Admitting: Family

## 2014-07-08 ENCOUNTER — Ambulatory Visit: Payer: 59

## 2014-07-08 VITALS — BP 144/70 | HR 85 | Temp 98.3°F | Resp 18 | Ht 69.0 in | Wt 382.0 lb

## 2014-07-08 DIAGNOSIS — D72829 Elevated white blood cell count, unspecified: Secondary | ICD-10-CM

## 2014-07-08 LAB — CBC WITH DIFFERENTIAL (CANCER CENTER ONLY)
BASO#: 0.1 10*3/uL (ref 0.0–0.2)
BASO%: 0.3 % (ref 0.0–2.0)
EOS%: 1.4 % (ref 0.0–7.0)
Eosinophils Absolute: 0.2 10*3/uL (ref 0.0–0.5)
HCT: 39.8 % (ref 34.8–46.6)
HGB: 12.8 g/dL (ref 11.6–15.9)
LYMPH#: 1.7 10*3/uL (ref 0.9–3.3)
LYMPH%: 11.2 % — ABNORMAL LOW (ref 14.0–48.0)
MCH: 26.1 pg (ref 26.0–34.0)
MCHC: 32.2 g/dL (ref 32.0–36.0)
MCV: 81 fL (ref 81–101)
MONO#: 0.9 10*3/uL (ref 0.1–0.9)
MONO%: 5.6 % (ref 0.0–13.0)
NEUT%: 81.5 % — ABNORMAL HIGH (ref 39.6–80.0)
NEUTROS ABS: 12.6 10*3/uL — AB (ref 1.5–6.5)
PLATELETS: 313 10*3/uL (ref 145–400)
RBC: 4.91 10*6/uL (ref 3.70–5.32)
RDW: 16 % — ABNORMAL HIGH (ref 11.1–15.7)
WBC: 15.5 10*3/uL — ABNORMAL HIGH (ref 3.9–10.0)

## 2014-07-08 LAB — CHCC SATELLITE - SMEAR

## 2014-07-08 NOTE — Progress Notes (Signed)
Hematology/Oncology Consultation   Name: [name]      MRN: [id number]    Location: Room/bed info not found  Date: 07/08/2014 Time:10:22 PM   REFERRING PHYSICIAN: Dennie Fetters, MD  REASON FOR CONSULT: Leukocytosis   DIAGNOSIS: Leukocytosis  HISTORY OF PRESENT ILLNESS: [name] is a very pleasant 54 yo white female with a year long history of elevated WBC counts. Her white cell count today is 15.5.  She was hospitalized in the ICU last year for SOB, coughing up blood and required O2. Her bloody sputum was found to be caused by ruptured alveoli. She was eventually diagnosed with ANCA associated vasculitis and is now followed closely by pulmonology and rheumatology. She has been on Prednisone for over a year now. She states that she has "60%" of her lung function and that it will not get any better.  She does not have a very good support system at home. Her husband is a Administrator and is gone for months at a time. Her truck breaks down often and since having to quit her job because of her health sh doesn't have much gas money. She has quit pulmonary rehab.  She used to work for [employer] but is now on disability.  She is also discouraged about her weight and would like to try to start losing again.  She is trying to eat better and is staying hydrated.  She has had no fever, chills, n/v, rash, dizziness, chest pain, palpitations, abdominal pan, diarrhea, blood in urine or stool. She has constipation at times that is relieved with stool softeners or juice.  She has had no bruising and no more episodes of bleeding. She has a lot of anxiety and depression. She has frequent panic attacks and is on several medications to help with these issues.   She quit smoking over 15 years ago. She does not drink ETOH.  She does not have any children and has never been pregnant. No family history of elevated WBC counts. No personal or familial history of cancer.  She has no tenderness, numbness or  tingling in her extremities.   ROS: All other 10 point review of systems is negative.   PAST MEDICAL HISTORY:   Past Medical History  Diagnosis Date  . Colitis   . Obesity (BMI 30-39.9) 09/23/2012  . Anxiety associated with depression 09/23/2012  . Hx of tobacco use, presenting hazards to health 09/23/2012  . At high risk for falls 09/23/2012    She has had a fractured ankle and tendon tear with falls over the last couple years.  . Diverticulitis of large intestine with perforation 09/23/2012  . GERD (gastroesophageal reflux disease)     ALLERGIES: Allergies  Allergen Reactions  . Mucinex [Guaifenesin Er] Nausea And Vomiting  . Sulfa Antibiotics       MEDICATIONS:  Current Outpatient Prescriptions on File Prior to Visit  Medication Sig Dispense Refill  . ALPRAZolam (XANAX) 1 MG tablet Take 1 tablet (1 mg total) by mouth 3 (three) times daily as needed for anxiety. 90 tablet 0  . azaTHIOprine (IMURAN) 50 MG tablet Take 150 mg by mouth daily.     Marland Kitchen buPROPion (WELLBUTRIN XL) 300 MG 24 hr tablet Take 450 mg by mouth daily.     . citalopram (CELEXA) 40 MG tablet Take 40 mg by mouth daily.    Marland Kitchen dextromethorphan (DELSYM) 30 MG/5ML liquid Take by mouth as needed for cough.    . esomeprazole (NEXIUM) 40 MG capsule Take  40 mg by mouth daily at 12 noon.    . furosemide (LASIX) 40 MG tablet Take 1 tablet by mouth daily.    . phentermine 37.5 MG capsule Take 37.5 mg by mouth every morning. Take 1/2 tablet BID to suppress appitite    . polyethylene glycol (MIRALAX / GLYCOLAX) packet Take 17 g by mouth at bedtime.     . predniSONE (DELTASONE) 5 MG tablet Take 10 mg by mouth daily with breakfast.     No current facility-administered medications on file prior to visit.     PAST SURGICAL HISTORY Past Surgical History  Procedure Laterality Date  . Appendectomy    . Tonsillectomy    . Bladder repair      FAMILY HISTORY: Family History  Problem Relation Age of Onset  . Cancer Paternal  Grandmother     breast    SOCIAL HISTORY:  reports that she quit smoking about 13 years ago. She has never used smokeless tobacco. She reports that she does not drink alcohol or use illicit drugs.  PERFORMANCE STATUS: The patient's performance status is 0 - Asymptomatic  PHYSICAL EXAM: Most Recent Vital Signs: Blood pressure 144/70, pulse 85, temperature 98.3 F (36.8 C), temperature source Oral, resp. rate 18, height 5\' 9"  (1.753 m), weight 382 lb (173.274 kg). BP 144/70 mmHg  Pulse 85  Temp(Src) 98.3 F (36.8 C) (Oral)  Resp 18  Ht 5\' 9"  (1.753 m)  Wt 382 lb (173.274 kg)  BMI 56.39 kg/m2  General Appearance:    Alert, cooperative, no distress, appears stated age  Head:    Normocephalic, without obvious abnormality, atraumatic  Eyes:    PERRL, conjunctiva/corneas clear, EOM's intact, fundi    benign, both eyes        Throat:   Lips, mucosa, and tongue normal; teeth and gums normal  Neck:   Supple, symmetrical, trachea midline, no adenopathy;    thyroid:  no enlargement/tenderness/nodules; no carotid   bruit or JVD  Back:     Symmetric, no curvature, ROM normal, no CVA tenderness  Lungs:     Clear to auscultation bilaterally, respirations unlabored  Chest Wall:    No tenderness or deformity   Heart:    Regular rate and rhythm, S1 and S2 normal, no murmur, rub   or gallop  Breast Exam:    No tenderness, masses, or nipple abnormality  Abdomen:     Soft, non-tender, bowel sounds active all four quadrants,    no masses, no organomegaly        Extremities:   Extremities normal, atraumatic, no cyanosis or edema  Pulses:   2+ and symmetric all extremities  Skin:   Skin color, texture, turgor normal, no rashes or lesions  Lymph nodes:   Cervical, supraclavicular, and axillary nodes normal  Neurologic:   CNII-XII intact, normal strength, sensation and reflexes    throughout   LABORATORY DATA:  Results for orders placed or performed in visit on 07/08/14 (from the past 48  hour(s))  CBC with Differential [facility])     Status: Abnormal   Collection Time: 07/08/14  2:33 PM  Result Value Ref Range   WBC 15.5 (H) 3.9 - 10.0 10e3/uL   RBC 4.91 3.70 - 5.32 10e6/uL   HGB 12.8 11.6 - 15.9 g/dL   HCT 39.8 34.8 - 46.6 %   MCV 81 81 - 101 fL   MCH 26.1 26.0 - 34.0 pg   MCHC 32.2 32.0 - 36.0 g/dL   RDW  16.0 (H) 11.1 - 15.7 %   Platelets 313 145 - 400 10e3/uL   NEUT# 12.6 (H) 1.5 - 6.5 10e3/uL   LYMPH# 1.7 0.9 - 3.3 10e3/uL   MONO# 0.9 0.1 - 0.9 10e3/uL   Eosinophils Absolute 0.2 0.0 - 0.5 10e3/uL   BASO# 0.1 0.0 - 0.2 10e3/uL   NEUT% 81.5 (H) 39.6 - 80.0 %   LYMPH% 11.2 (L) 14.0 - 48.0 %   MONO% 5.6 0.0 - 13.0 %   EOS% 1.4 0.0 - 7.0 %   BASO% 0.3 0.0 - 2.0 %  [facility] - Smear     Status: None   Collection Time: 07/08/14  2:33 PM  Result Value Ref Range   Smear Result Smear Available       RADIOGRAPHY: No results found.     PATHOLOGY: None  ASSESSMENT/PLAN: [name] is a very pleasant 54 yo white female with a year long history of elevated WBC counts. She has chronic lung issues and has been on Prednisone for over a year. These could certainly attribute to her elevated WBC count.  She is asymptomatic with her white count of 15.5 at this time. The rest of her differential looks ok.  Dr. Marin Olp viewed her blood smear and did not see any immature white cells or other evidence of malignancy.  We will not need to follow-up with her in our office at this time.  All questions were answered. She  knows to contact us with any related questions or concerns. We can certainly see her for any hem/onc issues in the future.   She was discussed with and also seen by Dr. Marin Olp and he is in agreement with the aforementioned.   [facility]     Addendum:  I saw and examined the patient with Kris Burd. We looked at her blood smear. She had minimal increase in white blood cells. The white cells appeared mature. I do not see any hypersegmented polys.  There were no atypical lymphocytes. She had no immature myeloid or lymphoid forms. Red cells appear normal in morphology. She had no nucleated red cells. Platelets appeared adequate.  I don't see anything on her exam that is significant.I think the leukocytosis is reactive. She does smoke.. She is on quite a few medications that might also be implicated. The fact that she is on prednisone and Imuran actually might have Her white cell count from being more elevated.  She does not need a bone marrow test. I cannot think of any x-ray studies that need  To be done. I really don't think that we have to see her back in the clinic. I does don't see that there is any hematologic issue that we would have to see her for.   She is very nice. It was nice talking to her. She is pretty funny.  We spent about 35 minutes with her. We answered all of her questions. Mostly, we gave her peace of mind that there was no problems with her blood.   Laurey Arrow

## 2014-07-09 ENCOUNTER — Encounter (HOSPITAL_COMMUNITY): Payer: 59

## 2014-07-14 ENCOUNTER — Encounter (HOSPITAL_COMMUNITY): Payer: 59

## 2014-07-16 ENCOUNTER — Encounter (HOSPITAL_COMMUNITY): Payer: 59

## 2014-07-21 ENCOUNTER — Encounter (HOSPITAL_COMMUNITY): Payer: 59

## 2014-09-15 ENCOUNTER — Ambulatory Visit: Payer: 59 | Admitting: Internal Medicine

## 2014-10-13 ENCOUNTER — Ambulatory Visit: Payer: 59 | Admitting: Internal Medicine

## 2014-11-20 ENCOUNTER — Ambulatory Visit: Payer: 59 | Admitting: Internal Medicine

## 2015-04-30 ENCOUNTER — Ambulatory Visit (INDEPENDENT_AMBULATORY_CARE_PROVIDER_SITE_OTHER): Payer: 59 | Admitting: Internal Medicine

## 2015-04-30 ENCOUNTER — Encounter: Payer: Self-pay | Admitting: Internal Medicine

## 2015-04-30 VITALS — BP 148/90 | HR 97 | Temp 98.0°F | Ht 69.0 in | Wt 379.8 lb

## 2015-04-30 DIAGNOSIS — R5381 Other malaise: Secondary | ICD-10-CM | POA: Diagnosis not present

## 2015-04-30 DIAGNOSIS — I776 Arteritis, unspecified: Secondary | ICD-10-CM

## 2015-04-30 DIAGNOSIS — J849 Interstitial pulmonary disease, unspecified: Secondary | ICD-10-CM

## 2015-04-30 DIAGNOSIS — I7782 Antineutrophilic cytoplasmic antibody (ANCA) vasculitis: Secondary | ICD-10-CM

## 2015-04-30 HISTORY — DX: Other malaise: R53.81

## 2015-04-30 NOTE — Addendum Note (Signed)
Addended by: Collier Salina on: 04/30/2015 02:46 PM   Modules accepted: Orders

## 2015-04-30 NOTE — Progress Notes (Signed)
Subjective:     Patient ID: [name], female   DOB: 1960-09-29, 55 y.o.   MRN: [id number]  HPI    OV 01/22/2014  Chief Complaint  Patient presents with  . Follow-up    Pt c/o increase in fatigue, DOE. Pt stated her cough has decreased since last OV.     Follow-up anca vasculitis, Pulmonary hge, ILD, chronic resp failure   Last seen by myself in October 2015. At that time she was subjectively still dyspneic and requiring oxygen but when we walked her 185 feet 3 laps on room air she did not desaturate. Subsequently she did follow up with my nurse practitioner and was instructed to stop her oxygen. Was end of the year 2015 to she did have some sinus infection and saw nurse practitioner. She says that she is unable to come off the oxygen and she feels that it helps with her dyspnea which is class III on exertion relieved by rest. She also reports pulmonary congestion all the time. She did have a chest x-ray December 2015 that is reported officially has interstitial pulmonary edema but it could easily be her obesity clouding pulmonary findings. She insists that she needs the oxygen and she cannot live without it. She continues with immunomodulators Imuran and prednisone with Dr. Tobie Lords   Pulmonaryfunction test today 01/22/2014 shows an FVC of 1.9 L/48%, FEV1 of 1.7 L/55%, ratio of 91/113%. Total lung capacity is 3.5 L/60%. DLCO is 19/64%. Overall restriction with low diffusion capacity in the context of Body mass index is 57.49 kg/(m^2).   CT scan of the chest 11/18/2013: Show significant improvement  CXR dec 2015 - pulm congestion v obesity   OV 05/11/2014  Chief Complaint  Patient presents with  . Follow-up    Pt stated she has a lack of energy, c/o seasonal allergies-watery eyes, sinus pressure, PND, prod cough with yellow, and chest tightness.      Follow-up anca vasculitis, Pulmonary hge, ILD, chronic resp failure    Overall stable. She has been attending  pulmonary rehabilitation but she has been irregular with a visits. She tells me that she subjectively still needs oxygen. Objectively this was never tested on room air at rehabilitation which she tells me that at home she desaturates. Although walking desaturation test 185 feet 3 laps at last visit in gender 2016 did not show any desaturation and repeated again today - she completed only 2 laps. Rested x 2: never desaturated below 95%    ., ____, CT scan of the chest 04/26/2004 and personally reviewed shows largely clear lung fields and resolved interstitial lung disease  As always she has multitude of complaints 1. She's worried that Dr. Ouida Sills her rheumatologist is retiring in July 2016. I've advised her to follow's recommendation and see either Dr. Amil Amen or Dr. Gavin Pound within the practice  2. She also is worried about her chronically persistent high WBC count and is wondering whether she should get plasma pheresis. I've cautioned against this but will repeat her lab work  3. She complains of persistent fatigue  4. She's worried that her permanent disability application might not get approved. I recommended for this disease permanent disability  CT chest 04/27/14  - IMPRESSION: 1. Resolving post infectious or inflammatory scarring in the lingula. 2. No definitive findings to suggest interstitial lung disease on today's examination. 3. Mild air trapping, indicative of mild small airways disease.   Electronically Signed  By: Vinnie Langton M.D.   On: 04/28/2014  09:40   OV 04/30/2015  Chief Complaint  Patient presents with  . Follow-up    coughing x 3 months, SOB, allergies have been bothering her.  Stopped Prednisone in January.  Stopped Pulmonary Therapy.  chest pain, swelling in feet, rapid heart rate.     Follow-up acute hypoxemic respiratory failure from vasculitis, pulmonary hemorrhage, interstitial lung disease in the June 2015. I personally am not seen her in  almost a year. She has followed up with Dr. Gavin Pound in [facility] rheumatology. She tells me that for the last 1 year she has not attended pulmonary rehabilitation. She continues blindly on Imuran. Earlier this year in 2017 Dr. Trudie Reed discontinued her prednisone according to history. She remained stable but she still has dyspnea. She believes that the dyspnea is slightly worse because she started pulmonary rehabilitation. Nevertheless she thinks she does not need oxygen.  She is dealing with social issues. She says that relationship with her husband is difficult.  Review of chart shows that last CT scan of the chest was one year ago  Walking desaturation test 185 feet 3 laps on room air: Walked only 2 laps and then stopped due to dyspnea. Peak heart rate was 127. Did not desaturate.      has a past medical history of Colitis; Obesity (BMI 30-39.9) (09/23/2012); Anxiety associated with depression (09/23/2012); tobacco use, presenting hazards to health (09/23/2012); At high risk for falls (09/23/2012); Diverticulitis of large intestine with perforation (09/23/2012); and GERD (gastroesophageal reflux disease).   reports that she quit smoking about 14 years ago. She has never used smokeless tobacco.  Past Surgical History  Procedure Laterality Date  . Appendectomy    . Tonsillectomy    . Bladder repair      Allergies  Allergen Reactions  . Mucinex [Guaifenesin Er] Nausea And Vomiting  . Sulfa Antibiotics     There is no immunization history for the selected administration types on file for this patient.  Family History  Problem Relation Age of Onset  . Cancer Paternal Grandmother     breast     Current outpatient prescriptions:  .  ALPRAZolam (XANAX) 1 MG tablet, Take 1 tablet (1 mg total) by mouth 3 (three) times daily as needed for anxiety., Disp: 90 tablet, Rfl: 0 .  azaTHIOprine (IMURAN) 50 MG tablet, Take 150 mg by mouth daily. , Disp: , Rfl:  .  buPROPion (WELLBUTRIN XL)  300 MG 24 hr tablet, Take 450 mg by mouth daily. , Disp: , Rfl:  .  citalopram (CELEXA) 40 MG tablet, Take 40 mg by mouth daily., Disp: , Rfl:  .  dextromethorphan (DELSYM) 30 MG/5ML liquid, Take by mouth as needed for cough., Disp: , Rfl:  .  esomeprazole (NEXIUM) 40 MG capsule, Take 40 mg by mouth daily at 12 noon., Disp: , Rfl:  .  Multiple Vitamin (MULTIVITAMIN) tablet, Take 1 tablet by mouth daily., Disp: , Rfl:  .  polyethylene glycol (MIRALAX / GLYCOLAX) packet, Take 17 g by mouth at bedtime. , Disp: , Rfl:      Review of Systems Per hpi    Objective:   Physical Exam  Constitutional: She is oriented to person, place, and time. She appears well-developed and well-nourished. No distress.  Morbidly obese  HENT:  Head: Normocephalic and atraumatic.  Right Ear: External ear normal.  Left Ear: External ear normal.  Mouth/Throat: Oropharynx is clear and moist. No oropharyngeal exudate.  Mallampati class III  Eyes: Conjunctivae and EOM are normal. Pupils  are equal, round, and reactive to light. Right eye exhibits no discharge. Left eye exhibits no discharge. No scleral icterus.  Neck: Normal range of motion. Neck supple. No JVD present. No tracheal deviation present. No thyromegaly present.  Cardiovascular: Normal rate, regular rhythm, normal heart sounds and intact distal pulses.  Exam reveals no gallop and no friction rub.   No murmur heard. Pulmonary/Chest: Effort normal and breath sounds normal. No respiratory distress. She has no wheezes. She has no rales. She exhibits no tenderness.  Abdominal: Soft. Bowel sounds are normal. She exhibits no distension and no mass. There is no tenderness. There is no rebound and no guarding.  Musculoskeletal: Normal range of motion. She exhibits no edema or tenderness.  Lymphadenopathy:    She has no cervical adenopathy.  Neurological: She is alert and oriented to person, place, and time. She has normal reflexes. No cranial nerve deficit. She  exhibits normal muscle tone. Coordination normal.  Skin: Skin is warm and dry. No rash noted. She is not diaphoretic. No erythema. No pallor.  Psychiatric: She has a normal mood and affect. Her behavior is normal. Judgment and thought content normal.  Talks a lot  Vitals reviewed.  Filed Vitals:   04/30/15 1412  BP: 148/90  Pulse: 97  Temp: 98 F (36.7 C)  TempSrc: Oral  Height: 5\' 9"  (1.753 m)  Weight: 379 lb 12.8 oz (172.276 kg)  SpO2: 98%  \Estimated body mass index is 56.06 kg/(m^2) as calculated from the following:   Height as of this encounter: 5\' 9"  (1.753 m).   Weight as of this encounter: 379 lb 12.8 oz (172.276 kg).       Assessment:       ICD-9-CM ICD-10-CM   1. 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 ILD (interstitial lung disease) ([place]) 515 J84.9   3. Physical deconditioning 799.3 R53.81    refer pulmonary rehabilitation Clinically disease appears in remission but we will need a CT scan of the chest. In addition I think she is physically deconditioned    Plan:     Refer pulmonary rehabilitation  Do high resolution CT scan of the chest - we will call you with the results - if this CT scan was ordered and we can follow her clinically with x-rays or history  Follow-up 6-9 months or sooner if needed -    (> 50% of this 15 min visit spent in face to face counseling or/and coordination of care)     Dr. Brand Males, M.D., [facility].C.P Pulmonary and Critical Care Medicine Staff Physician [place] [facility] Pager: [phone], If no answer or between  15:00h - 7:00h: call [phone]  04/30/2015 2:43 PM

## 2015-04-30 NOTE — Patient Instructions (Addendum)
ICD-9-CM ICD-10-CM   1. 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 ILD (interstitial lung disease) ([place]) 515 J84.9   3. Physical deconditioning 799.3 R53.81    referr pulm rehab  Do high resolution CT scan of the chest - we will call you with the results  Follow-up 6-9 months or sooner if needed -

## 2015-05-05 ENCOUNTER — Inpatient Hospital Stay: Admission: RE | Admit: 2015-05-05 | Payer: 59 | Source: Ambulatory Visit

## 2015-05-11 ENCOUNTER — Inpatient Hospital Stay: Admission: RE | Admit: 2015-05-11 | Payer: 59 | Source: Ambulatory Visit

## 2015-05-14 ENCOUNTER — Ambulatory Visit (INDEPENDENT_AMBULATORY_CARE_PROVIDER_SITE_OTHER)
Admission: RE | Admit: 2015-05-14 | Discharge: 2015-05-14 | Disposition: A | Discharge status: Home / Self Care | Payer: 59 | Source: Ambulatory Visit | Attending: Internal Medicine | Admitting: Internal Medicine

## 2015-05-14 DIAGNOSIS — J849 Interstitial pulmonary disease, unspecified: Secondary | ICD-10-CM

## 2015-05-14 DIAGNOSIS — I776 Arteritis, unspecified: Secondary | ICD-10-CM

## 2015-05-14 DIAGNOSIS — I7782 Antineutrophilic cytoplasmic antibody (ANCA) vasculitis: Secondary | ICD-10-CM

## 2015-05-21 ENCOUNTER — Telehealth: Payer: Self-pay | Admitting: Internal Medicine

## 2015-05-21 NOTE — Telephone Encounter (Signed)
Spoke with pt. She would like the results from her CT scan on 05/14/15.  MR - please advise. Thanks.

## 2015-05-21 NOTE — Telephone Encounter (Signed)
LM for pt

## 2015-05-21 NOTE — Telephone Encounter (Signed)
Patient called back regarding results. She can be reached at [phone]

## 2015-05-23 NOTE — Telephone Encounter (Signed)
unchangd from a year ago - just low level vessel inflammation [place] to year ago. Followup plans without change   IMPRESSION: 1. 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 No new focal regions of ground-glass opacity or airspace consolidation to suggest alveolar hemorrhage. 2. No evidence of development of fibrotic interstitial lung disease. 3. Mild diffuse hepatic steatosis.   Electronically Signed  By: Ilona Sorrel M.D.  On: 05/14/2015 17:07

## 2015-05-24 NOTE — Telephone Encounter (Signed)
Results have been explained to patient, pt expressed understanding. Nothing further needed.  

## 2015-05-25 ENCOUNTER — Telehealth: Payer: Self-pay | Admitting: Internal Medicine

## 2015-05-25 NOTE — Telephone Encounter (Signed)
Spoke with pt and she states that she was asleep when called with results. She just wanted to review them and make sure that she understood. Results and recommendations reviewed with pt. She voiced understanding and had no further questions or concerns. Nothing further needed.

## 2015-06-11 ENCOUNTER — Ambulatory Visit (HOSPITAL_COMMUNITY): Payer: 59

## 2015-07-02 ENCOUNTER — Telehealth: Payer: Self-pay | Admitting: Internal Medicine

## 2015-07-02 NOTE — Telephone Encounter (Signed)
Definitely see cards  Give acute visit next week to see an APP

## 2015-07-02 NOTE — Telephone Encounter (Signed)
Patient says that for the last month, she has been sitting up to sleep because when she lays down she gets SOB. Patient checks her oxygen level often and it fluctuates back and forth between 90-95.  Oxygen drops below 90 with heavy exertion.  She starts pulmonary therapy soon.  She is worried that this is going to put more pressure on her lungs.  She is worried also about it affecting her heart. Patient has oxygen tank at home that she uses "as needed basis".  Patient states that she is always out of breath. She said that she is going to discuss heart problems with Dr. Redmond Pulling.  She said that she gets sharp needle-like pains that go into back and into her neck.  She said she takes Aleve for that. Patient states she is off prednisone now.  She said it was causing her to gain a lot of weight.  She thinks that when she starts pulmonary therapy again it will help her lose her weight.  Patient says that when she walks around her heart rate gets really high (140's, 150's).    Dr. Chase Caller, please advise.

## 2015-07-02 NOTE — Telephone Encounter (Signed)
Spoke with pt and notified of recs per MR  Appt was scheduled for 07/06/15 at 2 pm

## 2015-07-05 ENCOUNTER — Encounter (HOSPITAL_COMMUNITY)
Admission: RE | Admit: 2015-07-05 | Discharge: 2015-07-05 | Disposition: A | Discharge status: Home / Self Care | Payer: 59 | Source: Ambulatory Visit | Attending: Internal Medicine | Admitting: Internal Medicine

## 2015-07-05 ENCOUNTER — Encounter (HOSPITAL_COMMUNITY): Payer: Self-pay

## 2015-07-05 VITALS — BP 127/103 | HR 94 | Resp 20 | Ht 67.5 in | Wt 375.7 lb

## 2015-07-05 DIAGNOSIS — I776 Arteritis, unspecified: Secondary | ICD-10-CM

## 2015-07-05 DIAGNOSIS — J849 Interstitial pulmonary disease, unspecified: Secondary | ICD-10-CM | POA: Insufficient documentation

## 2015-07-05 DIAGNOSIS — I7782 Antineutrophilic cytoplasmic antibody (ANCA) vasculitis: Secondary | ICD-10-CM

## 2015-07-05 NOTE — Progress Notes (Signed)
[name] 55 y.o. female Pulmonary Rehab Orientation Note Patient arrived today in Cardiac and Pulmonary Rehab for orientation to Pulmonary Rehab. She was transported from [employer] via wheel chair. She does not carry portable oxygen. Per pt, she uses oxygen rarely. She checks her oxygen saturations with exertion and shortness of breath and if her saturations drop below 88%, she will use her oxygen to recover. Color good, skin warm and dry. Patient is oriented to time and place. Patient's medical history, psychosocial health, and medications reviewed. Psychosocial assessment reveals pt lives with their spouse. However he is a long distance truck driver and he is only home 3 days a month. Pt is currently unemployed. She helps care for her father that is morbidly obese and bed bound. She states she has no hobbies or social activities. Pt reports her stress level is high. Areas of stress/anxiety include her health, the health and care of her father, her mothers health, finances, and her relationship with her husband..  Pt exhibits signs of depression such as sleeplessness, worry, and panic attacks. She also does not participate in any social activities and states she stays home or at her parents most of the time. She is very verbal about NOT being depressed. PHQ2/9 score 0/na. Pt shows poor coping skills with negative outlook. She was offered emotional support and reassurance and encouraged to speak with her physician about her antidepressant medication dosages and symptoms of depression. Will continue to monitor and evaluate progress toward psychosocial goal. Physical assessment reveals heart rate is normal, S1S2 present. Breath sounds are diminished due to body habitus. Grip strength equal, strong. Distal pulses palpable. Patient reports she does take medications as prescribed. Patient states she follows a Regular diet. The patient reports no specific efforts to gain or lose weight. Patient's weight will  be monitored closely. Demonstration and practice of PLB using pulse oximeter. Patient able to return demonstration satisfactorily. Safety and hand hygiene in the exercise area reviewed with patient. Patient voices understanding of the information reviewed. Department expectations discussed with patient and achievable goals were set. The patient shows enthusiasm about attending the program and we look forward to working with this nice lady. She will not be scheduled for a 6 min walk test or her daily exercise session until she is cleared by md. She states she has been having chest pressure with exertion and will see MD tomorrow to discuss. Will follow [place] with patient after appointment.  45 minutes was spent on a variety of activities such as assessment of the patient, obtaining baseline data including height, weight, BMI, and grip strength, verifying medical history, allergies, and current medications, and teaching patient strategies for performing tasks with less respiratory effort with emphasis on pursed lip breathing.

## 2015-07-06 ENCOUNTER — Encounter: Payer: Self-pay | Admitting: Pulmonary Disease

## 2015-07-06 ENCOUNTER — Ambulatory Visit (INDEPENDENT_AMBULATORY_CARE_PROVIDER_SITE_OTHER)
Admission: RE | Admit: 2015-07-06 | Discharge: 2015-07-06 | Disposition: A | Discharge status: Home / Self Care | Payer: 59 | Source: Ambulatory Visit | Attending: Pulmonary Disease | Admitting: Pulmonary Disease

## 2015-07-06 ENCOUNTER — Ambulatory Visit (INDEPENDENT_AMBULATORY_CARE_PROVIDER_SITE_OTHER): Payer: 59 | Admitting: Pulmonary Disease

## 2015-07-06 VITALS — BP 130/96 | HR 102 | Ht 68.0 in | Wt 377.0 lb

## 2015-07-06 DIAGNOSIS — R0600 Dyspnea, unspecified: Secondary | ICD-10-CM

## 2015-07-06 DIAGNOSIS — R079 Chest pain, unspecified: Secondary | ICD-10-CM

## 2015-07-06 MED ORDER — IOPAMIDOL (ISOVUE-370) INJECTION 76%
80.0000 mL | Freq: Once | INTRAVENOUS | Status: AC | PRN
  Administered 2015-07-06: 80 mL via INTRAVENOUS

## 2015-07-06 NOTE — Patient Instructions (Signed)
We will get a CT of the chest to evaluate for PE and revaluate your ANCA vasculitis  We will refer you to cardiology  Please follow up with Dr. Chase Caller

## 2015-07-06 NOTE — Progress Notes (Signed)
Subjective:    Patient ID: [name], female    DOB: [birth year], 55 y.o.   MRN: [id number]  HPI Acute visit for chest tightness, dyspnea.  [name] is a 55 year old with past medical history of ANCA vasculitis with lung involvement. She was previously on prednisone but this has since been tapered off since January 2017 after initiation of azathioprine. She is followed by Dr. Trudie Reed in Rheumatology and Dr. Chase Caller in Pulmonary clinic. She's had a follow-up CT scan on 05/14/15 which shows minimal groundglass opacities and no evidence of ILD.  She is here with complains of 1.5 months of chest pain, heaviness, tightness associated with increasing dyspnea, cough, chest congestion, wheezing. She is concerned that her lung problems may have recurred. She is also recently started cardiac rehabilitation and wants to know if it safe for her to carry on with these complaints.  DATA: CT high res 05/14/15 Faint round glass opacities bilaterally. No gross change from previous. Images reviewed.  PFTs 01/22/14 FVC 2.12 [52%] FEV1 1.8 (58%) F/F 86 TLC 60% DLCO 64%. Moderate restriction with DLCO impairement   Past Medical History  Diagnosis Date  . Colitis   . Obesity (BMI 30-39.9) 09/23/2012  . Anxiety associated with depression 09/23/2012  . Hx of tobacco use, presenting hazards to health 09/23/2012  . At high risk for falls 09/23/2012    She has had a fractured ankle and tendon tear with falls over the last couple years.  . Diverticulitis of large intestine with perforation 09/23/2012  . GERD (gastroesophageal reflux disease)     Current outpatient prescriptions:  .  ALPRAZolam (XANAX) 1 MG tablet, Take 1 tablet (1 mg total) by mouth 3 (three) times daily as needed for anxiety., Disp: 90 tablet, Rfl: 0 .  azaTHIOprine (IMURAN) 50 MG tablet, Take 150 mg by mouth daily. , Disp: , Rfl:  .  buPROPion (WELLBUTRIN XL) 300 MG 24 hr tablet, Take 450 mg by mouth daily. , Disp: , Rfl:  .   citalopram (CELEXA) 40 MG tablet, Take 40 mg by mouth daily., Disp: , Rfl:  .  dextromethorphan (DELSYM) 30 MG/5ML liquid, Take by mouth as needed for cough., Disp: , Rfl:  .  esomeprazole (NEXIUM) 40 MG capsule, Take 40 mg by mouth daily at 12 noon., Disp: , Rfl:  .  Multiple Vitamin (MULTIVITAMIN) tablet, Take 1 tablet by mouth daily., Disp: , Rfl:  .  polyethylene glycol (MIRALAX / GLYCOLAX) packet, Take 17 g by mouth at bedtime. , Disp: , Rfl:   Review of Systems Chest heaviness, chest pain, dyspnea, cough. No palpitation. No fevers, chills, hemoptysis. No nausea, vomiting, diarrhea, constipation. All other review of systems negative.    Objective:   Physical Exam Blood pressure 130/96, pulse 102, height 5\' 8"  (1.727 m), weight 377 lb (171.006 kg), SpO2 96 %. Gen: Morbidly obese, No apparent distress Neuro: No gross focal deficits. HEENT: No JVD, lymphadenopathy, thyromegaly. RS: Clear, No wheeze or crackles CVS: S1-S2 heard, no murmurs rubs gallops. Abdomen: Soft, positive bowel sounds. Musculoskeletal: No edema.    Assessment & Plan:  H/O ANCA vascilitis associated ILD Morbid obesity  Presents with acute new complaints of chest pain, tightness associated with dyspnea. I suspect her symptoms are secondary to her obesity, deconditioning. She is nervous in the office today and is worried about recurrence of her vasculitis and wants to know if she has to go back on the prednisone. She also does not want to "drop dead" on proceeding with  her exercise program.  She will be referred to cardiology for further evaluation of chest pain/pressure. To reassure her with regard to her lung issues I will get a CTA to both rule out a pulmonary embolus [low suspicion] and evaluate for recurrence of ILD. She was encouraged to continue with the rehab program and weight loss program.  Plan: - Cardiology referral - CTA of chest. - Weight loss and exercise.   Marshell Garfinkel MD  Pulmonary  and Critical Care Pager [phone] If no answer or after 3pm call: [phone] 07/07/2015, 2:31 PM

## 2015-07-07 ENCOUNTER — Other Ambulatory Visit: Payer: 59

## 2015-07-14 ENCOUNTER — Telehealth: Payer: Self-pay | Admitting: Internal Medicine

## 2015-07-14 DIAGNOSIS — R079 Chest pain, unspecified: Secondary | ICD-10-CM

## 2015-07-14 NOTE — Telephone Encounter (Signed)
Spoke with pt and she states that no one called her on 07/12/15. She did not have a voicemail or any other message or missed call. Advised pt that per her chart we did attempt to contact her. She had questions about her CT results. Reviewed results and PM's LOV rec's with her. She does not want to start Rehab until she has seen cards as she is concerned about her increased heart rate. Per chart there does not appear to have been a referral to cards placed. Referral placed today and pt advised that someone from cards will contact her to schedule. Nothing further needed.

## 2015-07-19 ENCOUNTER — Telehealth (HOSPITAL_COMMUNITY): Payer: Self-pay | Admitting: *Deleted

## 2015-08-17 ENCOUNTER — Ambulatory Visit: Payer: 59 | Admitting: Cardiovascular Disease

## 2015-08-18 ENCOUNTER — Ambulatory Visit: Payer: 59 | Admitting: Cardiovascular Disease

## 2015-08-18 NOTE — Progress Notes (Deleted)
Cardiology Office Note   Date:  08/18/2015   ID:  [name], DOB 08-27-1960, MRN [id number]  PCP:  [name], MD  Cardiologist:   Jenkins Rouge, MD   No chief complaint on file.     History of Present Illness: [name] is a 55 y.o. female who presents for evaluation of chest pain and dyspnea Seen by pulmonary for vasculitis   [name] is a 55 year old with past medical history of ANCA vasculitis with lung involvement. She was previously on prednisone but this has since been tapered off since January 2017 after initiation of azathioprine. She is followed by Dr. Trudie Reed in Rheumatology and Dr. Chase Caller in Pulmonary clinic. She's had a follow-up CT scan on 05/14/15 which shows minimal groundglass opacities and no evidence of ILD.  She is here with complains of 1.5 months of chest pain, heaviness, tightness associated with increasing dyspnea, cough, chest congestion, wheezing. She is concerned that her lung problems may have recurred. She is also recently started cardiac rehabilitation and wants to know if it safe for her to carry on with these complaints.  07/05/25  CTA no PE  05/14/15 High Res CT stable ILD/vasculitis   IMPRESSION: 1. 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 No new focal regions of ground-glass opacity or airspace consolidation to suggest alveolar hemorrhage. 2. No evidence of development of fibrotic interstitial lung disease. 3. Mild diffuse hepatic steatosis.  Echo 06/24/13 ok Study Conclusions  - Left ventricle: The cavity size was normal. There was mild focal basal hypertrophy of the septum. Systolic function was normal. The estimated ejection fraction was in the range of 55% to 60%. Wall motion was normal; there were no regional wall  motion abnormalities. - Right ventricle: The cavity size was mildly dilated. Wall thickness was normal.  Currently not on prednisone   Past Medical History:  Diagnosis Date  . Anxiety associated with depression 09/23/2012  . At high risk for falls 09/23/2012   She has had a fractured ankle and tendon tear with falls over the last couple years.  . Colitis   . Diverticulitis of large intestine with perforation 09/23/2012  . GERD (gastroesophageal reflux disease)   . Hx of tobacco use, presenting hazards to health 09/23/2012  . Obesity (BMI 30-39.9) 09/23/2012    Past Surgical History:  Procedure Laterality Date  . APPENDECTOMY    . BLADDER REPAIR    . TONSILLECTOMY       Current Outpatient Prescriptions  Medication Sig Dispense Refill  . ALPRAZolam (XANAX) 1 MG tablet Take 1 tablet (1 mg total) by mouth 3 (three) times daily as needed for anxiety. 90 tablet 0  . azaTHIOprine (IMURAN) 50 MG tablet Take 150 mg by mouth daily.     Marland Kitchen buPROPion (WELLBUTRIN XL) 300 MG 24 hr tablet Take 450 mg by mouth daily.     . citalopram (CELEXA) 40 MG tablet Take 40 mg by mouth daily.    Marland Kitchen dextromethorphan (DELSYM) 30 MG/5ML liquid Take by mouth as needed for cough.    . esomeprazole (NEXIUM) 40 MG capsule Take 40 mg by mouth daily at 12 noon.    . Multiple Vitamin (MULTIVITAMIN) tablet Take 1 tablet by mouth daily.    . polyethylene glycol (MIRALAX / GLYCOLAX) packet Take 17 g by mouth at bedtime.      No current facility-administered  medications for this visit.     Allergies:   Mucinex [guaifenesin er] and Sulfa antibiotics    Social History:  The patient  reports that she quit smoking about 14 years ago. She has never used smokeless tobacco. She reports that she does not drink alcohol or use drugs.   Family History:  The patient's family history includes Cancer in her paternal grandmother.    ROS:  Please see the history of present illness.   Otherwise, review of systems are positive  for {NONE DEFAULTED:18576::"none"}.   All other systems are reviewed and negative.    PHYSICAL EXAM: VS:  There were no vitals taken for this visit. , BMI There is no height or weight on file to calculate BMI. Affect appropriate Healthy:  appears stated age 22: normal Neck supple with no adenopathy JVP normal no bruits no thyromegaly Lungs clear with no wheezing and good diaphragmatic motion Heart:  S1/S2 no murmur, no rub, gallop or click PMI normal Abdomen: benighn, BS positve, no tenderness, no AAA no bruit.  No HSM or HJR Distal pulses intact with no bruits No edema Neuro non-focal Skin warm and dry No muscular weakness    EKG:  06/24/13 SR rate 95 nonspecific ST changes    Recent Labs: No results found for requested labs within last 8760 hours.    Lipid Panel    Component Value Date/Time   TRIG 159 (H) 09/30/2012 0545      Wt Readings from Last 3 Encounters:  07/06/15 (!) 377 lb (171 kg)  07/05/15 (!) 375 lb 10.6 oz (170.4 kg)  04/30/15 (!) 379 lb 12.8 oz (172.3 kg)      Other studies Reviewed: Additional studies/ records that were reviewed today include: ***.    ASSESSMENT AND PLAN:  1.  ***   Current medicines are reviewed at length with the patient today.  The patient {ACTIONS; HAS/DOES NOT HAVE:19233} concerns regarding medicines.  The following changes have been made:  {PLAN; NO CHANGE:13088:s}  Labs/ tests ordered today include: *** No orders of the defined types were placed in this encounter.    Disposition:   FU with ***     Signed, Jenkins Rouge, MD  08/18/2015 8:56 AM    [facility] [place], [place], [place]  [address] Phone: [phone]; Fax: [phone]

## 2015-09-16 ENCOUNTER — Telehealth (HOSPITAL_COMMUNITY): Payer: Self-pay | Admitting: *Deleted

## 2015-09-17 NOTE — Progress Notes (Deleted)
Cardiology Office Note   Date:  09/17/2015   ID:  [name], DOB [birth year], MRN [id number]  PCP:  [name] Seller, MD  Cardiologist:   Jenkins Rouge, MD   No chief complaint on file.     History of Present Illness: [name] is a 55 y.o. female who presents for evaluation of chest pain.  Complained to Dr [name] of sharp needle like pains into back and neck.  Aleve helps. Also complains of tachycardia With exertion.  Dyspnea when she lays down. Has oxygen at home she uses PRN>  Chest pain last 8 weeks with cough and congestion and wheezing   She has ANCA vasculitis with lung involvement.  No prednisone since 01/2015.  Able to wean off after Starting azathioprine .    Dx stable by CT on 07/06/15 stable with no ILD no PE   FEV1 1.8 DLCO 64% moderate restriction   Anxious trying to due "pulmonary rehab"  Symptoms thought due to anxiety and obesity  No previous history of cardiac disease Last echo 06/24/13 reviewed and EF normal with mild RVE No evidence of pulmonary hypertension   Past Medical History:  Diagnosis Date  . Anxiety associated with depression 09/23/2012  . At high risk for falls 09/23/2012   She has had a fractured ankle and tendon tear with falls over the last couple years.  . Colitis   . Diverticulitis of large intestine with perforation 09/23/2012  . GERD (gastroesophageal reflux disease)   . Hx of tobacco use, presenting hazards to health 09/23/2012  . Obesity (BMI 30-39.9) 09/23/2012    Past Surgical History:  Procedure Laterality Date  . APPENDECTOMY    . BLADDER REPAIR    . TONSILLECTOMY       Current Outpatient Prescriptions  Medication Sig Dispense Refill  . ALPRAZolam (XANAX) 1 MG tablet Take 1 tablet (1 mg total) by mouth 3 (three) times daily as needed for anxiety. 90 tablet 0  . azaTHIOprine (IMURAN) 50 MG tablet Take 150 mg by mouth daily.     Marland Kitchen buPROPion (WELLBUTRIN XL) 300 MG 24 hr tablet Take 450 mg by mouth daily.     .  citalopram (CELEXA) 40 MG tablet Take 40 mg by mouth daily.    Marland Kitchen dextromethorphan (DELSYM) 30 MG/5ML liquid Take by mouth as needed for cough.    . esomeprazole (NEXIUM) 40 MG capsule Take 40 mg by mouth daily at 12 noon.    . Multiple Vitamin (MULTIVITAMIN) tablet Take 1 tablet by mouth daily.    . polyethylene glycol (MIRALAX / GLYCOLAX) packet Take 17 g by mouth at bedtime.      No current facility-administered medications for this visit.     Allergies:   Mucinex [guaifenesin er] and Sulfa antibiotics    Social History:  The patient  reports that she quit smoking about 14 years ago. She has never used smokeless tobacco. She reports that she does not drink alcohol or use drugs.   Family History:  The patient's family history includes Cancer in her paternal grandmother.    ROS:  Please see the history of present illness.   Otherwise, review of systems are positive for none.   All other systems are reviewed and negative.    PHYSICAL EXAM: VS:  There were no vitals taken for this visit. , BMI There is no height or weight on file to calculate BMI. Affect appropriate Obese female  HEENT: normal Neck supple with no adenopathy JVP normal  no bruits no thyromegaly Lungs clear with no wheezing and good diaphragmatic motion Heart:  S1/S2 no murmur, no rub, gallop or click PMI normal Abdomen: benighn, BS positve, no tenderness, no AAA no bruit.  No HSM or HJR Distal pulses intact with no bruits No edema Neuro non-focal Skin warm and dry No muscular weakness    EKG:  06/24/13  SR rate 98 nonspecific ST changes LAE    Recent Labs: No results found for requested labs within last 8760 hours.    Lipid Panel    Component Value Date/Time   TRIG 159 (H) 09/30/2012 0545      Wt Readings from Last 3 Encounters:  07/06/15 (!) 171 kg (377 lb)  07/05/15 (!) 170.4 kg (375 lb 10.6 oz)  04/30/15 (!) 172.3 kg (379 lb 12.8 oz)      Other studies Reviewed: Additional studies/ records  that were reviewed today include: Notes pulmonary CTA/CXR ECG;s .    ASSESSMENT AND PLAN:  1.  Chest Pain 2. Dyspnea 3. Anxiety  4. Vasculitis    Current medicines are reviewed at length with the patient today.  The patient does not have concerns regarding medicines.  The following changes have been made:  no change  Labs/ tests ordered today include: Echo and lexiscan myovue  No orders of the defined types were placed in this encounter.    Disposition:   FU with [place] PRN      Signed, Jenkins Rouge, MD  09/17/2015 1:20 PM    [facility] [place], [place],   [address] Phone: [phone]; Fax: [phone]

## 2015-09-20 ENCOUNTER — Ambulatory Visit: Payer: 59 | Admitting: Cardiovascular Disease

## 2015-09-30 ENCOUNTER — Telehealth: Payer: Self-pay | Admitting: Internal Medicine

## 2015-09-30 DIAGNOSIS — R079 Chest pain, unspecified: Secondary | ICD-10-CM

## 2015-09-30 NOTE — Telephone Encounter (Signed)
Call from portia in rehab. Patient having exertyional chest pain relieved by rest. Truddie Crumble says cards clearance needed but patient no showed for cards appt saying "I do not want to know what is wrong with my heart". Please call [name] and let her know I think she needs to see cards and please make a referral. Rehab decision only after that  Dr. Brand Males, M.D., [facility].C.P Pulmonary and Critical Care Medicine Staff Physician [place] [facility] Pager: [phone], If no answer or between  15:00h - 7:00h: call [phone]  09/30/2015 10:40 AM

## 2015-09-30 NOTE — Telephone Encounter (Signed)
lmtcb for pt.  

## 2015-10-01 NOTE — Telephone Encounter (Signed)
Called and spoke to pt. Informed her of the recs per MR. Order placed. Pt verbalized understanding and denied any further questions or concerns at this time.   

## 2015-10-05 NOTE — Addendum Note (Signed)
Encounter addended by: Lance Morin, RN on: 10/05/2015  9:57 AM<BR>    Actions taken: Sign clinical note, Episode resolved

## 2015-10-05 NOTE — Progress Notes (Signed)
[name] attended orientation for pulmonary rehab on 07/05/2015 and voiced that she has occasional episodes of chest pain at home.  Her walk test was postponed until she had a cardiology consult.  She cancelled 2-3 appointments with a cardiologist for varied reasons with the last one being that she was afraid to find out what was wrong with her heart.  She continued to stress she wanted to attend pulmonary rehab and that her chest pain was from stress or her lungs.  Her pulmonologist, Dr. Chase Caller was consulted and he agreed that she cannot attend the program until she has been cleared by cardiology.  This process started 07/05/2015 and as of 10/05/2015 she still has not seen a cardiologist.  She has been discharged from pulmonary rehab.

## 2015-10-08 ENCOUNTER — Telehealth: Payer: Self-pay | Admitting: Internal Medicine

## 2015-10-08 NOTE — Telephone Encounter (Signed)
Referral was put in for pt to see cardiologist.  Serita Grammes at [facility] has documented in referral -   pt cancel appt in  8/8, 8/9, 9/11, 10/2  gesila   I have closed the referral.

## 2015-10-08 NOTE — Telephone Encounter (Signed)
Fine; she wont  Be able to do rehab then and she knows that through rehab staff. Please close mssage and I am closing message  Dr. Brand Males, M.D., [facility].C.P Pulmonary and Critical Care Medicine Staff Physician [place] [facility] Pager: [phone], If no answer or between  15:00h - 7:00h: call [phone]  10/08/2015 12:03 PM   .

## 2015-10-11 ENCOUNTER — Ambulatory Visit: Payer: 59 | Admitting: Cardiovascular Disease

## 2015-11-03 ENCOUNTER — Ambulatory Visit: Payer: 59 | Admitting: Internal Medicine

## 2015-11-16 ENCOUNTER — Ambulatory Visit: Payer: 59 | Admitting: Internal Medicine

## 2015-12-09 ENCOUNTER — Encounter: Payer: Self-pay | Admitting: Internal Medicine

## 2015-12-09 ENCOUNTER — Ambulatory Visit (INDEPENDENT_AMBULATORY_CARE_PROVIDER_SITE_OTHER): Payer: 59 | Admitting: Internal Medicine

## 2015-12-09 VITALS — BP 144/92 | HR 106 | Ht 68.0 in | Wt 368.0 lb

## 2015-12-09 DIAGNOSIS — R0789 Other chest pain: Secondary | ICD-10-CM | POA: Diagnosis not present

## 2015-12-09 DIAGNOSIS — R0602 Shortness of breath: Secondary | ICD-10-CM | POA: Diagnosis not present

## 2015-12-09 DIAGNOSIS — I7782 Antineutrophilic cytoplasmic antibody (ANCA) vasculitis: Secondary | ICD-10-CM

## 2015-12-09 DIAGNOSIS — I776 Arteritis, unspecified: Secondary | ICD-10-CM

## 2015-12-09 LAB — NITRIC OXIDE: Nitric Oxide: 7

## 2015-12-09 NOTE — Addendum Note (Signed)
Addended by: Maryanna Shape A on: 12/09/2015 03:55 PM   Modules accepted: Orders

## 2015-12-09 NOTE — Patient Instructions (Addendum)
ICD-9-CM ICD-10-CM   1. Atypical chest pain 786.59 R07.89   2. SOB (shortness of breath) 786.05 R06.02 Nitric oxide  3. 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HCC) 447.6 I77.6     Vasculitis appears in remission Unclear cause of chest pains - could be artritis of rib joints but need to rule out heart issues   Plan -refer [facility] cardiology  Followup - 6 months or sooner if needed

## 2015-12-09 NOTE — Progress Notes (Signed)
Subjective:     Patient ID: [name], female   DOB: October 23, 1960, 55 y.o.   MRN: [id number]  HPI    OV 12/09/2015  Chief Complaint  Patient presents with  . Follow-up    Pt states her SOB has worsened x 1.5 weeks. Pt c/o emesis x 4 days, c/o prod cough - pt unsure of color. Pt c/o constipation x 2 weeks. Pt c/o chest pressure when coughing.     Anka vasculitis patient in remission. Now complicated by obesity and dyspnea. In summer 2017 saw my colleague for atypical chest pains. CT angiogram ruled out pulmonary embolism. Lung fields are clear. She was then referred to cardiology but she did not make it to the cardiology visit. Because she felt her chest pains are atypical and not consistent with myocardial symptoms. This is what she tells me now. In addition for the last 10 days she thinks that dyspnea is worse. Last 5 days or so she's been constipated for the last 2 days associated had increased nausea and vomiting. The only thing she's been able to regain today is crackers and some liquids. This no fever or hemoptysis or chills or rigors  Exhaled nitric oxide test today in the office 12/09/2015: feno 7ppb and normal  Course complicated by obsity, deconditioning and inability to do rehab    has a past medical history of Anxiety associated with depression (09/23/2012); At high risk for falls (09/23/2012); Colitis; Diverticulitis of large intestine with perforation (09/23/2012); GERD (gastroesophageal reflux disease); tobacco use, presenting hazards to health (09/23/2012); and Obesity (BMI 30-39.9) (09/23/2012).   reports that she quit smoking about 14 years ago. She has a 27.00 pack-year smoking history. She has never used smokeless tobacco.  Past Surgical History:  Procedure Laterality Date  . APPENDECTOMY    . BLADDER REPAIR    . TONSILLECTOMY      Allergies  Allergen Reactions  . Mucinex [Guaifenesin Er] Nausea And Vomiting  . Sulfa Antibiotics     Immunization History   Administered Date(s) Administered  . Influenza,inj,Quad PF,36+ Mos 11/03/2015    Family History  Problem Relation Age of Onset  . Cancer Paternal Grandmother     breast     Current Outpatient Prescriptions:  .  ALPRAZolam (XANAX) 1 MG tablet, Take 1 tablet (1 mg total) by mouth 3 (three) times daily as needed for anxiety., Disp: 90 tablet, Rfl: 0 .  azaTHIOprine (IMURAN) 50 MG tablet, Take 150 mg by mouth daily. , Disp: , Rfl:  .  buPROPion (WELLBUTRIN XL) 300 MG 24 hr tablet, Take 450 mg by mouth daily. , Disp: , Rfl:  .  citalopram (CELEXA) 40 MG tablet, Take 40 mg by mouth daily., Disp: , Rfl:  .  dextromethorphan (DELSYM) 30 MG/5ML liquid, Take by mouth as needed for cough., Disp: , Rfl:  .  esomeprazole (NEXIUM) 40 MG capsule, Take 40 mg by mouth daily at 12 noon., Disp: , Rfl:  .  Multiple Vitamin (MULTIVITAMIN) tablet, Take 1 tablet by mouth daily., Disp: , Rfl:  .  Naproxen Sodium (ALEVE PO), Take by mouth as needed., Disp: , Rfl:  .  polyethylene glycol (MIRALAX / GLYCOLAX) packet, Take 17 g by mouth at bedtime. , Disp: , Rfl:     Review of Systems     Objective:   Physical Exam  Constitutional: She is oriented to person, place, and time. She appears well-developed and well-nourished. No distress.  Morbidly obese  HENT:  Head: Normocephalic and atraumatic.  Right Ear: External ear normal.  Left Ear: External ear normal.  Mouth/Throat: Oropharynx is clear and moist. No oropharyngeal exudate.  Eyes: Conjunctivae and EOM are normal. Pupils are equal, round, and reactive to light. Right eye exhibits no discharge. Left eye exhibits no discharge. No scleral icterus.  Neck: Normal range of motion. Neck supple. No JVD present. No tracheal deviation present. No thyromegaly present.  Cardiovascular: Normal rate, regular rhythm, normal heart sounds and intact distal pulses.  Exam reveals no gallop and no friction rub.   No murmur heard. Pulmonary/Chest: Effort normal and  breath sounds normal. No respiratory distress. She has no wheezes. She has no rales. She exhibits no tenderness.  Abdominal: Soft. Bowel sounds are normal. She exhibits no distension and no mass. There is no tenderness. There is no rebound and no guarding.  Abdomen slightly soft  Musculoskeletal: Normal range of motion. She exhibits no edema or tenderness.  Lymphadenopathy:    She has no cervical adenopathy.  Neurological: She is alert and oriented to person, place, and time. She has normal reflexes. No cranial nerve deficit. She exhibits normal muscle tone. Coordination normal.  Skin: Skin is warm and dry. No rash noted. She is not diaphoretic. No erythema. No pallor.  Psychiatric: She has a normal mood and affect. Her behavior is normal. Judgment and thought content normal.  Extremely talkative  Vitals reviewed.   Vitals:   12/09/15 1511  BP: (!) 144/92  Pulse: (!) 106  SpO2: 97%  Weight: (!) 368 lb (166.9 kg)  Height: 5\' 8"  (1.727 m)   Estimated body mass index is 55.95 kg/m as calculated from the following:   Height as of this encounter: 5\' 8"  (1.727 m).   Weight as of this encounter: 368 lb (166.9 kg).       Assessment:       ICD-9-CM ICD-10-CM   1. Atypical chest pain 786.59 R07.89   2. SOB (shortness of breath) 786.05 R06.02 Nitric oxide  3. 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HCC) 447.6 I77.6        Plan:      Vasculitis appears in remission Unclear cause of chest pains - could be artritis of rib joints but need to rule out heart issues   Plan -refer [facility] cardiology  Followup - 6 months or sooner if needed  (> 50% of this 15 min visit spent in face to face counseling or/and coordination of care)    Dr. Brand Males, M.D., [facility].C.P Pulmonary and Critical Care Medicine Staff Physician [place] [facility] Pager: [phone], If no answer or between  15:00h - 7:00h: call [phone]  12/09/2015 3:53 PM

## 2016-01-13 NOTE — Progress Notes (Deleted)
Cardiology Office Note   Date:  01/13/2016   ID:  [name], DOB 07/13/1960, MRN [id number]  PCP:  [name], MD  Cardiologist:   Jenkins Rouge, MD   No chief complaint on file.     History of Present Illness: [name] is a 56 y.o. female who presents for evaluation of chest pain Seen by pulmonary 12/09/15 complained of more dyspnea with nause and vomiting. Had atypical chest pain last summer CTA was negative for PE.Quit smoking 14 years ago.She is morbidly obese at over 360 lbs.    Anca vasculitis in remission not on prednisone since 01/2015 on azathioprine.  Minimal ground glass on CT done 05/14/15 no ILd  To my review of CT very minimal coronary calcification for age With minimal plaque seen in mid LAD  Echo done 06/14/13 for hemoptysis and respiratory failure showed Mild RV dilatation normal PA pressure EF 55-60% focal basal LVH No other valve disease  Past Medical History:  Diagnosis Date  . Anxiety associated with depression 09/23/2012  . At high risk for falls 09/23/2012   She has had a fractured ankle and tendon tear with falls over the last couple years.  . Colitis   . Diverticulitis of large intestine with perforation 09/23/2012  . GERD (gastroesophageal reflux disease)   . Hx of tobacco use, presenting hazards to health 09/23/2012  . Obesity (BMI 30-39.9) 09/23/2012    Past Surgical History:  Procedure Laterality Date  . APPENDECTOMY    . BLADDER REPAIR    . TONSILLECTOMY       Current Outpatient Prescriptions  Medication Sig Dispense Refill  . ALPRAZolam (XANAX) 1 MG tablet Take 1 tablet (1 mg total) by mouth 3 (three) times daily as needed for anxiety. 90 tablet 0  . azaTHIOprine (IMURAN) 50 MG tablet Take 150 mg by mouth daily.     Marland Kitchen buPROPion (WELLBUTRIN XL) 300 MG 24 hr tablet Take 450 mg by mouth daily.     . citalopram (CELEXA) 40 MG tablet Take 40 mg by mouth daily.    Marland Kitchen dextromethorphan (DELSYM) 30 MG/5ML liquid Take by mouth as  needed for cough.    . esomeprazole (NEXIUM) 40 MG capsule Take 40 mg by mouth daily at 12 noon.    . Multiple Vitamin (MULTIVITAMIN) tablet Take 1 tablet by mouth daily.    . Naproxen Sodium (ALEVE PO) Take by mouth as needed.    . polyethylene glycol (MIRALAX / GLYCOLAX) packet Take 17 g by mouth at bedtime.      No current facility-administered medications for this visit.     Allergies:   Mucinex [guaifenesin er] and Sulfa antibiotics    Social History:  The patient  reports that she quit smoking about 15 years ago. She has a 27.00 pack-year smoking history. She has never used smokeless tobacco. She reports that she does not drink alcohol or use drugs.   Family History:  The patient's family history includes Cancer in her paternal grandmother.    ROS:  Please see the history of present illness.   Otherwise, review of systems are positive for {NONE DEFAULTED:18576::"none"}.   All other systems are reviewed and negative.    PHYSICAL EXAM: VS:  There were no vitals taken for this visit. , BMI There is no height or weight on file to calculate BMI. Affect appropriate Healthy:  appears stated age 69: normal Neck supple with no adenopathy JVP normal no bruits no thyromegaly Lungs clear with no  wheezing and good diaphragmatic motion Heart:  S1/S2 no murmur, no rub, gallop or click PMI normal Abdomen: benighn, BS positve, no tenderness, no AAA no bruit.  No HSM or HJR Distal pulses intact with no bruits No edema Neuro non-focal Skin warm and dry No muscular weakness    EKG:  ***   Recent Labs: No results found for requested labs within last 8760 hours.    Lipid Panel    Component Value Date/Time   TRIG 159 (H) 09/30/2012 0545      Wt Readings from Last 3 Encounters:  12/09/15 (!) 368 lb (166.9 kg)  07/06/15 (!) 377 lb (171 kg)  07/05/15 (!) 375 lb 10.6 oz (170.4 kg)      Other studies Reviewed: Additional studies/ records that were reviewed today include:  ***.    ASSESSMENT AND PLAN:  1.  Chest Pain 2. PUlmonary 3. Obesity 4. Anxiety/Depression   Current medicines are reviewed at length with the patient today.  The patient {ACTIONS; HAS/DOES NOT HAVE:19233} concerns regarding medicines.  The following changes have been made:  {PLAN; NO CHANGE:13088:s}  Labs/ tests ordered today include: *** No orders of the defined types were placed in this encounter.    Disposition:   FU with ***     Signed, Jenkins Rouge, MD  01/13/2016 9:28 PM    [facility] [place], [place], [place]  [address] Phone: [phone]; Fax: [phone]

## 2016-01-17 ENCOUNTER — Ambulatory Visit: Payer: 59 | Admitting: Cardiovascular Disease

## 2016-02-16 NOTE — Progress Notes (Deleted)
Cardiology Office Note   Date:  02/16/2016   ID:  [name], DOB 1960/07/26, MRN [id number]  PCP:  [name], MD  Cardiologist:   Jenkins Rouge, MD   No chief complaint on file.     History of Present Illness: [name] is a 56 y.o. female who presents for atypical chest pain Seen by pulmonary on 12/09/15 complained of nausea and vomiting Increased dyspnea with Exertion and chest pain. Had similar pains last summer and CTA negative for PE Referred to cards but did not see.  ANCA associated vasculitis  in remission Quit smoking 14 years / ago   Echo 2015 reviewed.  EF 55-60%  Mild RVE   Past Medical History:  Diagnosis Date  . Anxiety associated with depression 09/23/2012  . At high risk for falls 09/23/2012   She has had a fractured ankle and tendon tear with falls over the last couple years.  . Colitis   . Diverticulitis of large intestine with perforation 09/23/2012  . GERD (gastroesophageal reflux disease)   . Hx of tobacco use, presenting hazards to health 09/23/2012  . Obesity (BMI 30-39.9) 09/23/2012    Past Surgical History:  Procedure Laterality Date  . APPENDECTOMY    . BLADDER REPAIR    . TONSILLECTOMY       Current Outpatient Prescriptions  Medication Sig Dispense Refill  . ALPRAZolam (XANAX) 1 MG tablet Take 1 tablet (1 mg total) by mouth 3 (three) times daily as needed for anxiety. 90 tablet 0  . azaTHIOprine (IMURAN) 50 MG tablet Take 150 mg by mouth daily.     Marland Kitchen buPROPion (WELLBUTRIN XL) 300 MG 24 hr tablet Take 450 mg by mouth daily.     . citalopram (CELEXA) 40 MG tablet Take 40 mg by mouth daily.    Marland Kitchen dextromethorphan (DELSYM) 30 MG/5ML liquid Take by mouth as needed for cough.    . esomeprazole (NEXIUM) 40 MG capsule Take 40 mg by mouth daily at 12 noon.    . Multiple Vitamin (MULTIVITAMIN) tablet Take 1 tablet by mouth daily.    . Naproxen Sodium (ALEVE PO) Take by mouth as needed.    . polyethylene glycol (MIRALAX / GLYCOLAX)  packet Take 17 g by mouth at bedtime.      No current facility-administered medications for this visit.     Allergies:   Mucinex [guaifenesin er] and Sulfa antibiotics    Social History:  The patient  reports that she quit smoking about 15 years ago. She has a 27.00 pack-year smoking history. She has never used smokeless tobacco. She reports that she does not drink alcohol or use drugs.   Family History:  The patient's family history includes Cancer in her paternal grandmother.    ROS:  Please see the history of present illness.   Otherwise, review of systems are positive for none.   All other systems are reviewed and negative.    PHYSICAL EXAM: VS:  There were no vitals taken for this visit. , BMI There is no height or weight on file to calculate BMI. Affect appropriate Healthy:  appears stated age 17: normal Neck supple with no adenopathy JVP normal no bruits no thyromegaly Lungs clear with no wheezing and good diaphragmatic motion Heart:  S1/S2 no murmur, no rub, gallop or click PMI normal Abdomen: benighn, BS positve, no tenderness, no AAA no bruit.  No HSM or HJR Distal pulses intact with no bruits No edema Neuro non-focal Skin warm and  dry No muscular weakness    EKG:  2015 SR rate 91 borderline repolarization abnormality    Recent Labs: No results found for requested labs within last 8760 hours.    Lipid Panel    Component Value Date/Time   TRIG 159 (H) 09/30/2012 0545      Wt Readings from Last 3 Encounters:  12/09/15 (!) 368 lb (166.9 kg)  07/06/15 (!) 377 lb (171 kg)  07/05/15 (!) 375 lb 10.6 oz (170.4 kg)      Other studies Reviewed: Additional studies/ records that were reviewed today include: Notes pulmonary DR Chase Caller old ECG CT and labs .    ASSESSMENT AND PLAN:  1. Chest Pain 2. Pulmonary 3. Obesity  4. ANCA Vasculitis 5. Depression    Current medicines are reviewed at length with the patient today.  The patient does not have  concerns regarding medicines.  The following changes have been made:  no change  Labs/ tests ordered today include: *** No orders of the defined types were placed in this encounter.    Disposition:   FU with ***     Signed, Jenkins Rouge, MD  02/16/2016 2:44 PM    [facility] [address], [place], [place]  [address] Phone: [phone]; Fax: [phone]

## 2016-02-17 ENCOUNTER — Encounter: Payer: Self-pay | Admitting: *Deleted

## 2016-02-17 ENCOUNTER — Ambulatory Visit: Payer: 59 | Admitting: Cardiovascular Disease

## 2016-02-23 ENCOUNTER — Encounter: Payer: Self-pay | Admitting: Cardiovascular Disease

## 2016-02-23 IMAGING — CR DG CHEST 1V PORT
1 series · 1 of 1 positions shown · non-contrast
Comparison: Portable chest x-ray May 23, 2013 and chest CT scan
of the same day.

CLINICAL DATA: Respiratory distress

EXAM:
PORTABLE CHEST - 1 VIEW

[AP]
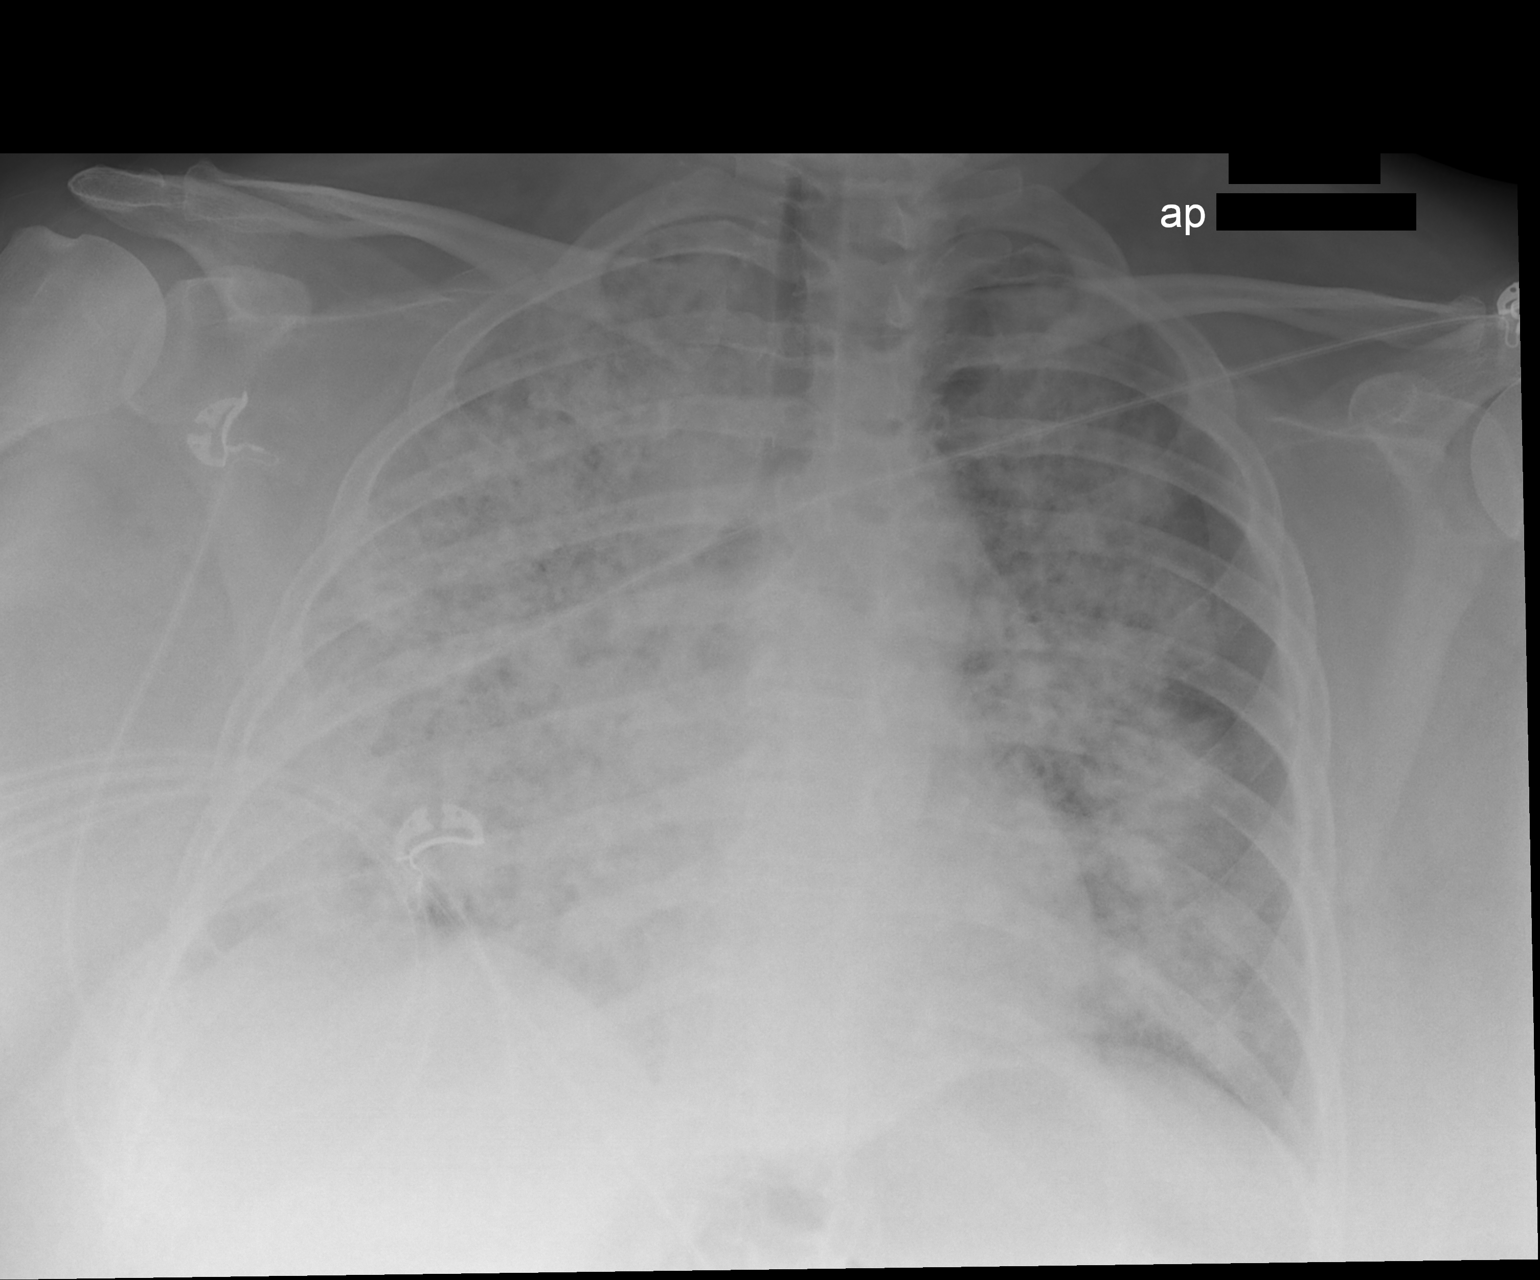

[1 of 1 positions shown; findings below may reference images not displayed]

FINDINGS: Widespread pulmonary parenchymal opacities are again demonstrated.
The age of greater on the right than on the left. Minimal sparing of
the periphery of the left lung is again demonstrated. The right
heart border is obscured. The left heart border is normal. The
pulmonary vascularity is obscured.
IMPRESSION: Widespread alveolar opacities are more conspicuous today. The
findings are consistent with pneumonia or pulmonary hemorrhage or
other alveolar filling process.

## 2016-05-02 IMAGING — CR DG CHEST 2V
2 series · 2 of 2 positions shown · non-contrast
Comparison: Chest x-rays dated 08/05/2013, 07/10/2013, 07/04/2013
and 06/30/2013 and chest CT dated 07/06/2013

CLINICAL DATA: Shortness of breath. Substernal pressure. Pulmonary
alveolar hemorrhage.

EXAM:
CHEST  2 VIEW

[view not recorded (1 of 2)]
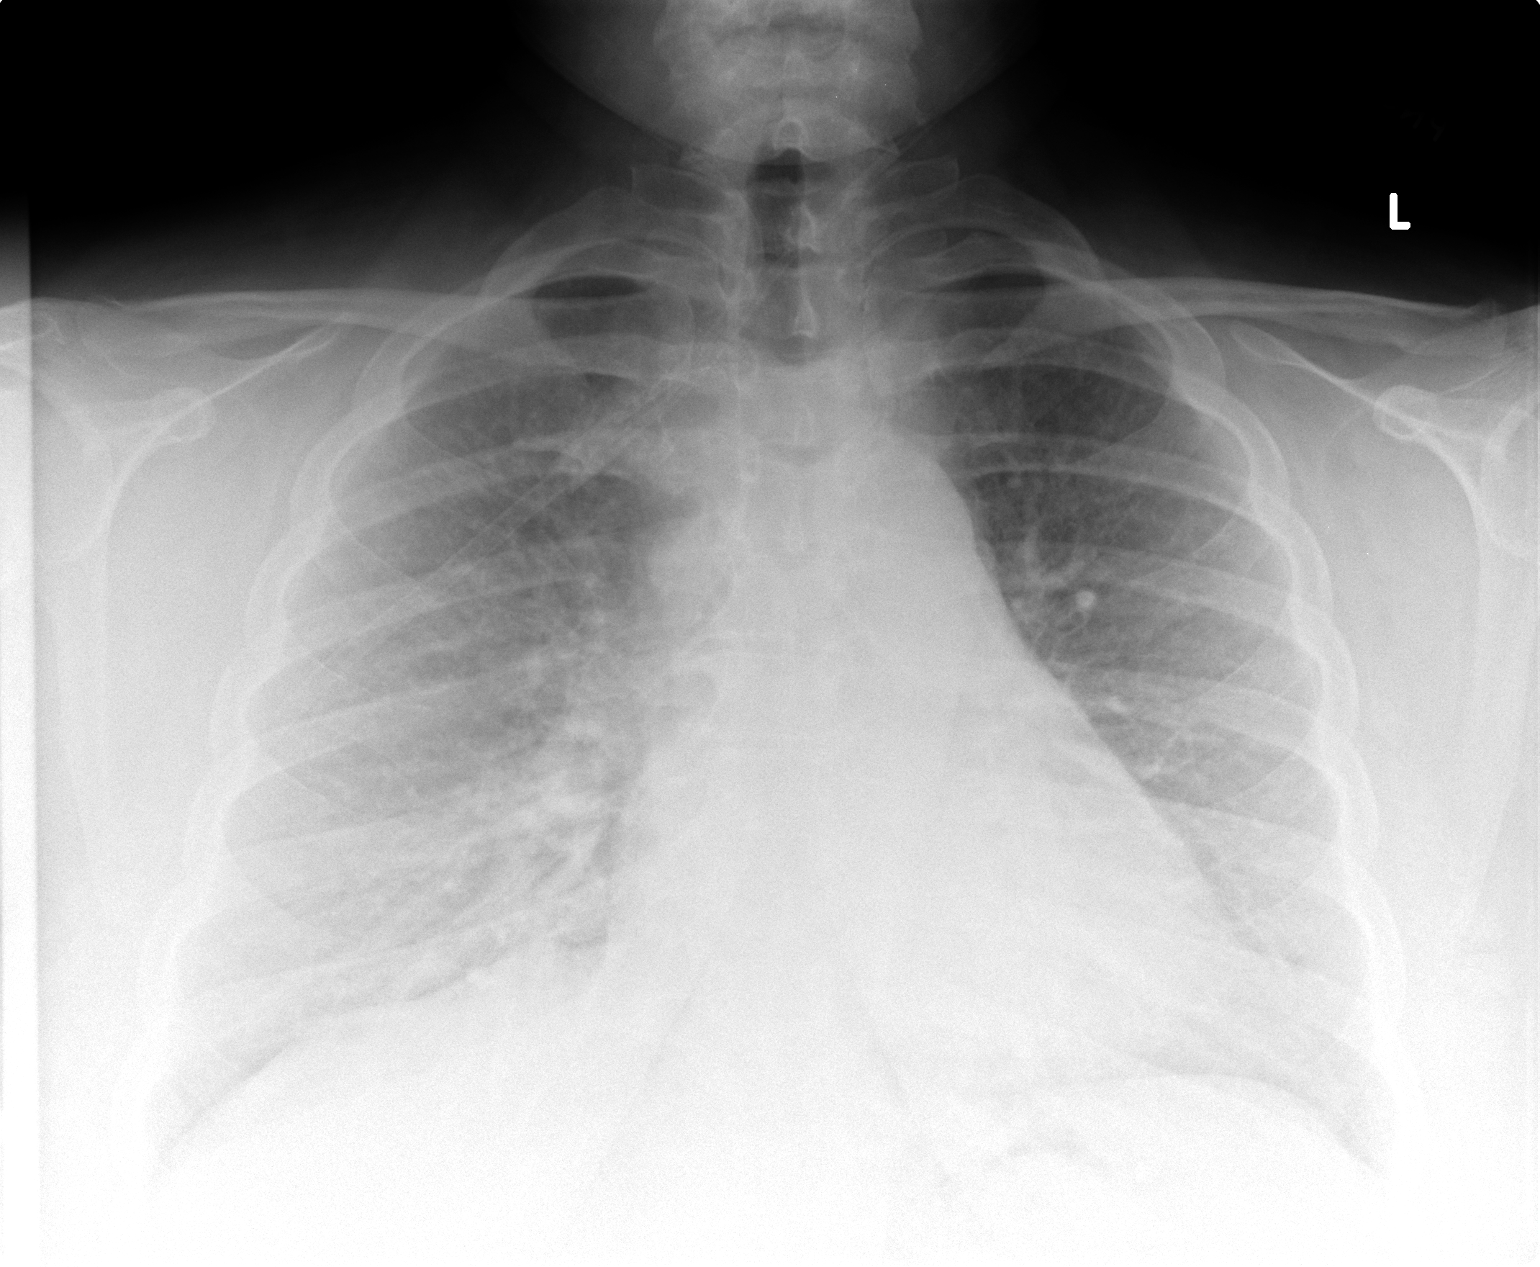

[view not recorded (2 of 2)]
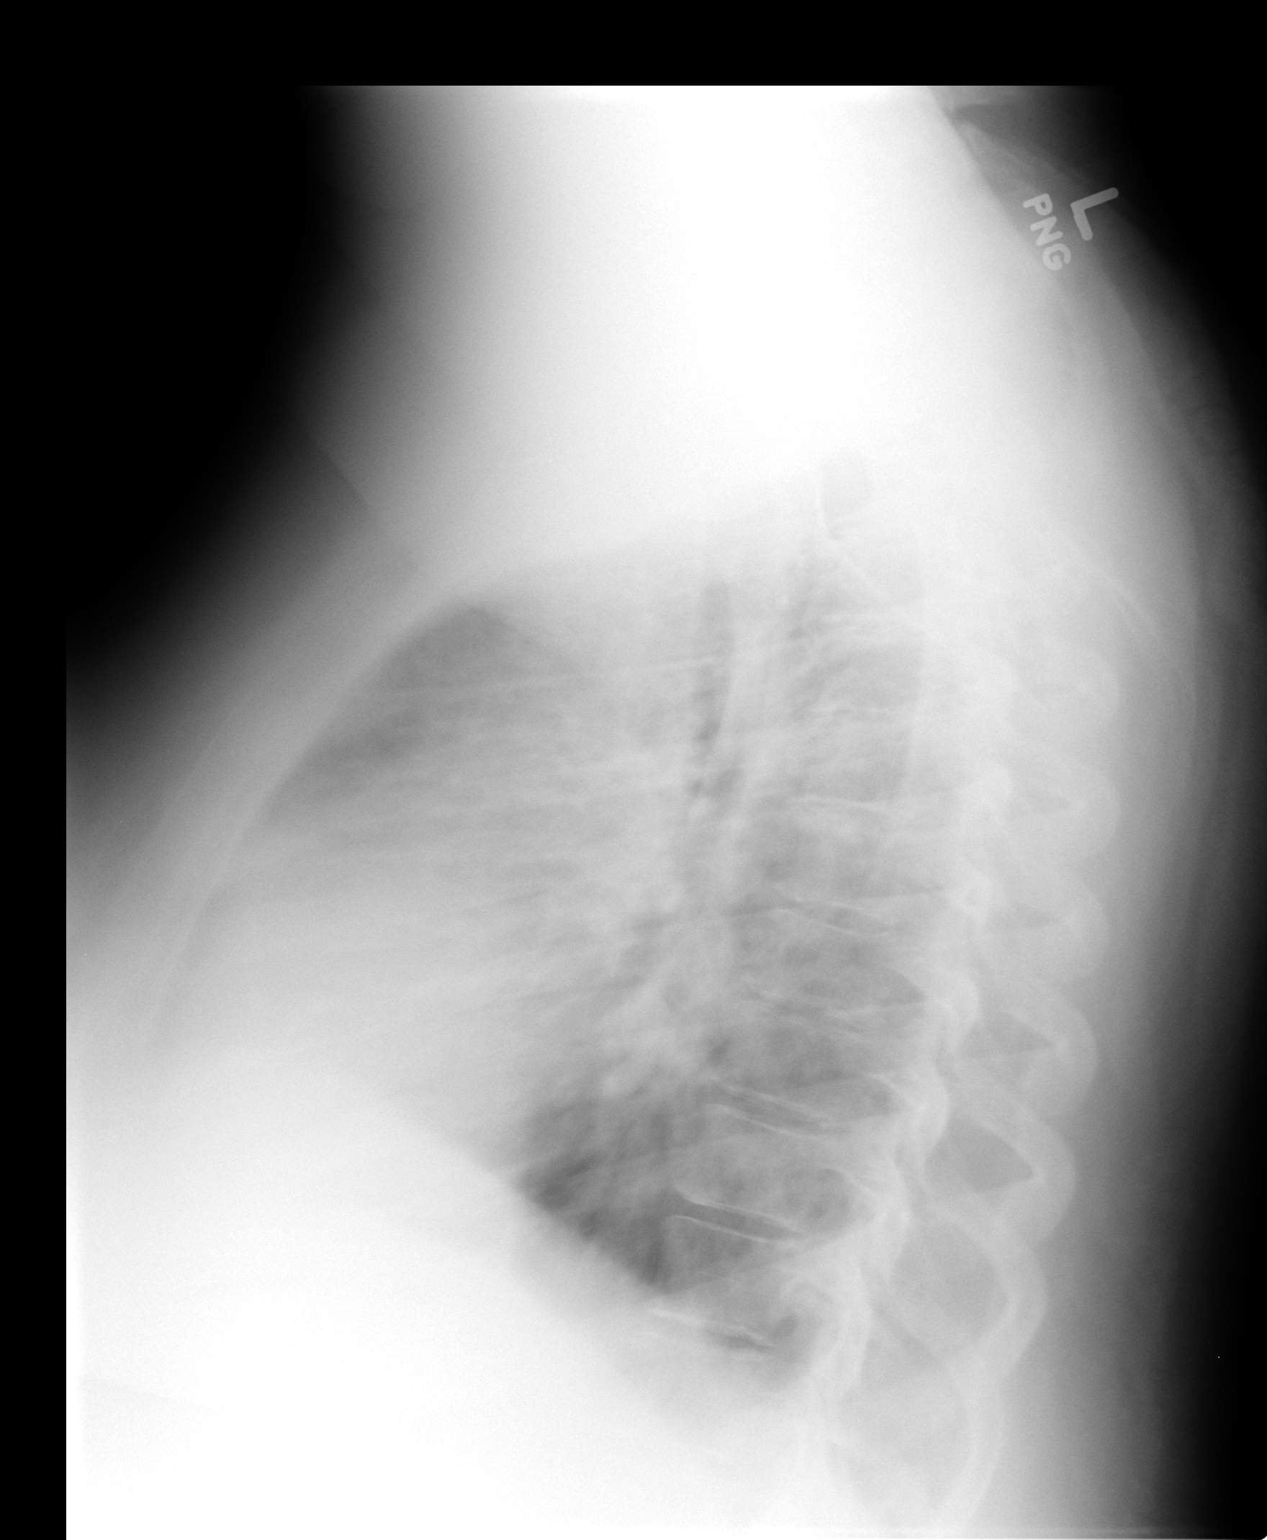

[2 of 2 positions shown; findings below may reference images not displayed]

FINDINGS: Heart size and pulmonary vascularity are are normal. Azygos vein
remains prominent. Left lung has cleared. There is minimal residual
haziness in the right lung base. No appreciable effusions.
IMPRESSION: Complete clearing of the left lung. Minimal residual haziness at the
right base.

## 2016-06-08 ENCOUNTER — Ambulatory Visit: Payer: 59 | Admitting: Internal Medicine

## 2016-06-16 ENCOUNTER — Ambulatory Visit: Payer: 59 | Admitting: Internal Medicine

## 2016-07-02 ENCOUNTER — Emergency Department (HOSPITAL_COMMUNITY)
Admission: EM | Admit: 2016-07-02 | Discharge: 2016-07-02 | Disposition: A | Discharge status: Home / Self Care | Payer: 59 | Attending: Emergency Medicine | Admitting: Emergency Medicine

## 2016-07-02 ENCOUNTER — Encounter (HOSPITAL_COMMUNITY): Payer: Self-pay | Admitting: Emergency Medicine

## 2016-07-02 ENCOUNTER — Emergency Department (HOSPITAL_COMMUNITY): Payer: 59

## 2016-07-02 DIAGNOSIS — Y999 Unspecified external cause status: Secondary | ICD-10-CM | POA: Diagnosis not present

## 2016-07-02 DIAGNOSIS — Z87891 Personal history of nicotine dependence: Secondary | ICD-10-CM | POA: Insufficient documentation

## 2016-07-02 DIAGNOSIS — R220 Localized swelling, mass and lump, head: Secondary | ICD-10-CM | POA: Diagnosis not present

## 2016-07-02 DIAGNOSIS — S5012XA Contusion of left forearm, initial encounter: Secondary | ICD-10-CM | POA: Insufficient documentation

## 2016-07-02 DIAGNOSIS — W19XXXA Unspecified fall, initial encounter: Secondary | ICD-10-CM

## 2016-07-02 DIAGNOSIS — Y92009 Unspecified place in unspecified non-institutional (private) residence as the place of occurrence of the external cause: Secondary | ICD-10-CM | POA: Diagnosis not present

## 2016-07-02 DIAGNOSIS — S92502A Displaced unspecified fracture of left lesser toe(s), initial encounter for closed fracture: Secondary | ICD-10-CM

## 2016-07-02 DIAGNOSIS — Y939 Activity, unspecified: Secondary | ICD-10-CM | POA: Insufficient documentation

## 2016-07-02 DIAGNOSIS — Z79899 Other long term (current) drug therapy: Secondary | ICD-10-CM | POA: Insufficient documentation

## 2016-07-02 DIAGNOSIS — S99922A Unspecified injury of left foot, initial encounter: Secondary | ICD-10-CM | POA: Diagnosis present

## 2016-07-02 DIAGNOSIS — W010XXA Fall on same level from slipping, tripping and stumbling without subsequent striking against object, initial encounter: Secondary | ICD-10-CM | POA: Diagnosis not present

## 2016-07-02 DIAGNOSIS — S92512A Displaced fracture of proximal phalanx of left lesser toe(s), initial encounter for closed fracture: Secondary | ICD-10-CM | POA: Insufficient documentation

## 2016-07-02 HISTORY — DX: Microscopic polyangiitis: M31.7

## 2016-07-02 MED ORDER — HYDROMORPHONE HCL 1 MG/ML IJ SOLN
1.0000 mg | Freq: Once | INTRAMUSCULAR | Status: AC
  Administered 2016-07-02: 1 mg via INTRAVENOUS
  Filled 2016-07-02: qty 1

## 2016-07-02 MED ORDER — LIDOCAINE HCL (PF) 2 % IJ SOLN
10.0000 mL | Freq: Once | INTRAMUSCULAR | Status: AC
  Administered 2016-07-02: 10 mL via INTRADERMAL
  Filled 2016-07-02: qty 10

## 2016-07-02 MED ORDER — OXYCODONE-ACETAMINOPHEN 5-325 MG PO TABS: 1.0000 | ORAL_TABLET | ORAL | 0 refills | Status: DC | PRN

## 2016-07-02 MED ORDER — ALPRAZOLAM 0.25 MG PO TABS
1.0000 mg | ORAL_TABLET | Freq: Once | ORAL | Status: AC
  Administered 2016-07-02: 1 mg via ORAL
  Filled 2016-07-02: qty 4

## 2016-07-02 NOTE — ED Triage Notes (Signed)
Pt arrives to ED via [facility] following mechanical fall at home. Pt states that she has a messy house (would not let EMS inside) and was stepping over something and felt herself about to trip/fall. She reached for the wall, but instead grabbed the 65" TV, which fell on top of her. Pt states that she woke up and the TV was on top of her. Pt initially denied LOC, but reports now that she has a headache and her R ankle and L elbow are "killing her." Slight swelling and hematoma, as well as a small abrasion, noted to R ankle/top of foot. L elbow appears swollen with a knot and hematoma as well. Pt states that when she woke up, her L arm was bent backwards. Pulses moderate bilaterally, grip strength strong and equal. Pt is hysterical, going on and on about how she received awful treatment from EMS. Pt was ambulatory outside of her home and managed to get TV off and go outside, where she met EMS. Pt tearful and hard to redirect regarding events, but answers questions appropriately, follows commands, and is A&O x 4. Sister arrived to ED and is helping keep the patient calm. Sister reports that pt has been off of her medications for a while (Wellbutrin, Xanax, and something else?). 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 EMS was unable to obtain BP d/t patient's inability to keep her arm still, HR 113, and SpO2 99% RA. Ice packs placed on injuries.

## 2016-07-02 NOTE — ED Provider Notes (Signed)
[facility] DEPT Provider Note   CSN: [id number] Arrival date & time: 07/02/16  0151  By signing my name below, I, Ny'Kea Lewis, attest that this documentation has been prepared under the direction and in the presence of Sherwood Gambler, MD. Electronically Signed: Lise Auer, ED Scribe. 07/02/16. 3:08 AM.  History   Chief Complaint Chief Complaint  Patient presents with  . Fall   The history is provided by the patient. No language interpreter was used.   HPI Comments: [name] is a 56 y.o. female with a history of anxiety, GERD, MPA, and obesity brought in by ambulance, who presents to the Emergency Department complaining of left forearm and right ankle s/p mechanical fall that occurred PTA. Pt notes associated headache and neck pain. Pt reports she was in her living room when she slipped and tried to use the wall to catch her self when she grabbed the 65" inch tv instead and it landed on her foot. Pt notes she loss consciousness for a brief period of time. At this time she reports she is unable get up due to her right ankle.   Past Medical History:  Diagnosis Date  . Anxiety associated with depression 09/23/2012  . At high risk for falls 09/23/2012   She has had a fractured ankle and tendon tear with falls over the last couple years.  . Colitis   . Diverticulitis of large intestine with perforation 09/23/2012  . GERD (gastroesophageal reflux disease)   . History of endometriosis 1997  . Hx of tobacco use, presenting hazards to health 09/23/2012  . MPA (microscopic polyangiitis) ([place]) 2015  . Obesity (BMI 30-39.9) 09/23/2012    Patient Active Problem List   Diagnosis Date Noted  . Physical deconditioning 04/30/2015  . Dyspnea 05/11/2014  . Leukocytosis 05/11/2014  . Chronic fatigue 05/11/2014  . Morbid obesity ([place]) 01/22/2014  . ILD (interstitial lung disease) ([place]) 01/22/2014  . Acute bronchitis 01/08/2014  . Chronic respiratory failure with hypoxia ([place]) 11/04/2013    . 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  . Microscopic polyangiitis ([place]) 08/08/2013  . Hyperglycemia 07/24/2013  . GERD (gastroesophageal reflux disease) 07/24/2013  . Hypokalemia 07/20/2013  . 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 Hemoptysis 06/23/2013  . Acute blood loss anemia 06/23/2013  . Respiratory failure with hypoxia ([place]) 06/23/2013  . 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 Perforation of sigmoid colon - stercoral 09/27/2012  . Constipation, chronic 09/27/2012  . Obesity (BMI 30-39.9) 09/23/2012  . Anxiety associated with depression 09/23/2012  . Hx of tobacco use, presenting hazards to health 09/23/2012  . H/O gastroesophageal reflux (GERD) 09/23/2012  . At high risk for falls 09/23/2012    Past Surgical History:  Procedure Laterality Date  . APPENDECTOMY    . BLADDER REPAIR    . LAPAROSCOPY  1997   dx of endometriosis  . LAPAROSCOPY  04/28/2000    Laparoscopy with lysis of adhesions, hysteroscopy, D&C.  Marland Kitchen TONSILLECTOMY      OB History    No data available     Home Medications    Prior to Admission medications   Medication Sig Start Date End Date Taking? Authorizing Provider  ALPRAZolam Duanne Moron) 1 MG tablet Take 1 tablet (1 mg total) by mouth 3 (three) times daily as needed for anxiety. 08/22/13   Blanchie Serve, MD  azaTHIOprine (IMURAN) 50 MG tablet Take 150 mg by mouth daily.     [provider]  buPROPion (WELLBUTRIN XL) 300 MG  24 hr tablet Take 450 mg by mouth daily.  08/30/12   [provider]  citalopram (CELEXA) 40 MG tablet Take 40 mg by mouth daily.    [provider]  dextromethorphan (DELSYM) 30 MG/5ML liquid Take by mouth as needed for cough.    [provider]  esomeprazole (NEXIUM) 40 MG capsule Take 40 mg by mouth daily at 12 noon.    [provider]  Multiple Vitamin (MULTIVITAMIN) tablet Take 1 tablet by mouth daily.    [provider]  Naproxen Sodium (ALEVE PO) Take by mouth as needed.    [provider]  oxyCODONE-acetaminophen (PERCOCET) 5-325 MG tablet Take 1-2 tablets by mouth every 4 (four) hours as needed for severe pain. 07/02/16   Sherwood Gambler, MD  polyethylene glycol (MIRALAX / Floria Raveling) packet Take 17 g by mouth at bedtime.     [provider]    Family History Family History  Problem Relation Age of Onset  . Diabetes Mother        AODM  . Diabetes Father        AODM  . Hypertension Father   . Obesity Father   . Breast cancer Paternal Grandmother   . Birth defects Cousin   . CAD Other        [facility]    Social History Social History  Substance Use Topics  . Smoking status: Former Smoker    Packs/day: 1.00    Years: 27.00    Quit date: 01/09/2001  . Smokeless tobacco: Never Used  . Alcohol use No     Comment: last ETOH 3-4 months ago   Allergies   Mucinex [guaifenesin er] and Sulfa antibiotics  Review of Systems Review of Systems  Musculoskeletal: Positive for arthralgias, joint swelling and neck pain.  Skin: Positive for color change.  Neurological: Positive for headaches. Negative for weakness.  All other systems reviewed and are negative.   Physical Exam Updated Vital Signs BP (!) 107/93   Pulse (!) 136   Temp 98 F (36.7 C) (Oral)   Resp 18   LMP 12/22/2012 (Approximate)   SpO2 (!) 85%   Physical Exam  Constitutional: She is oriented to person, place, and time. She appears well-developed and well-nourished.  HENT:  Head: Normocephalic.  Right Ear: External ear normal.  Left Ear: External ear normal.  Nose: Nose normal.  Right occipital swelling and tenderness.  Eyes: Right eye exhibits no discharge. Left eye exhibits no discharge.  Neck: Normal range of motion.  Mild right neck tenderness.   Cardiovascular: Normal rate, regular rhythm and normal heart sounds.   Pulmonary/Chest: Effort normal and breath sounds normal.  Abdominal: Soft. There  is no tenderness.  Musculoskeletal: She exhibits tenderness.  Left shoulder tenderness, normal ROM. Mild ecchymosis to proximal left forearm with mildly decreased ROM. Normal wrist and hand.  Mild distal tibia tenderness on the right. Lateral right ankle swelling and tenderness. No significant tenderness to the right foot.   Neurological: She is alert and oriented to person, place, and time.  Skin: Skin is warm and dry.  Nursing note and vitals reviewed.  ED Treatments / Results  DIAGNOSTIC STUDIES: Oxygen Saturation is 100% on RA, normal by my interpretation.   COORDINATION OF CARE: 2:47 AM-Discussed next steps with pt. Pt verbalized understanding and is agreeable with the plan.   Labs (all labs ordered are listed, but only abnormal results are displayed) Labs Reviewed - No data to display  EKG  EKG  Interpretation None       Radiology Dg Elbow Complete Left  Result Date: 07/02/2016 CLINICAL DATA:  Trip and fall at home, television fell on patient. EXAM: LEFT FOREARM - 2 VIEW; LEFT ELBOW - COMPLETE 3+ VIEW COMPARISON:  None. FINDINGS: LEFT elbow: No acute fracture deformity or dislocation. No destructive bony lesions. No advanced degenerative change for age. Soft tissue planes are not suspicious. LEFT forearm: No acute fracture deformity or dislocation. No destructive bony lesions. Soft tissue planes are not suspicious. IMPRESSION: Negative. Electronically Signed   By: Elon Alas M.D.   On: 07/02/2016 04:03   Dg Forearm Left  Result Date: 07/02/2016 CLINICAL DATA:  Trip and fall at home, television fell on patient. EXAM: LEFT FOREARM - 2 VIEW; LEFT ELBOW - COMPLETE 3+ VIEW COMPARISON:  None. FINDINGS: LEFT elbow: No acute fracture deformity or dislocation. No destructive bony lesions. No advanced degenerative change for age. Soft tissue planes are not suspicious. LEFT forearm: No acute fracture deformity or dislocation. No destructive bony lesions. Soft tissue planes are  not suspicious. IMPRESSION: Negative. Electronically Signed   By: Elon Alas M.D.   On: 07/02/2016 04:03   Dg Tibia/fibula Right  Result Date: 07/02/2016 CLINICAL DATA:  Trip and fall at home, television fell on patient. EXAM: RIGHT TIBIA AND FIBULA - 2 VIEW; RIGHT FOOT COMPLETE - 3+ VIEW; RIGHT ANKLE - COMPLETE 3+ VIEW COMPARISON:  None. FINDINGS: RIGHT tibia and fibula: No acute fracture deformity or dislocation. Moderate tricompartmental osteoarthrosis of the knee, incompletely characterized. No destructive bony lesions. Soft tissue planes are not suspicious. RIGHT ankle: No fracture deformity nor dislocation. The ankle mortise appears congruent and the tibiofibular syndesmosis intact. No destructive bony lesions. Soft tissue planes are non-suspicious. RIGHT foot: Acute comminuted transverse fracture through distal aspect fifth proximal phalanx without intra-articular extension. Lateral angulation of distal bony fragments. No dislocation. Moderate plantar calcaneal spur. No destructive bony lesions. Soft tissue planes are not suspicious. IMPRESSION: Acute displaced fifth proximal phalanx fracture.  No dislocation. Moderate knee osteoarthrosis, incompletely characterized. Electronically Signed   By: Elon Alas M.D.   On: 07/02/2016 04:07   Dg Ankle Complete Right  Result Date: 07/02/2016 CLINICAL DATA:  Trip and fall at home, television fell on patient. EXAM: RIGHT TIBIA AND FIBULA - 2 VIEW; RIGHT FOOT COMPLETE - 3+ VIEW; RIGHT ANKLE - COMPLETE 3+ VIEW COMPARISON:  None. FINDINGS: RIGHT tibia and fibula: No acute fracture deformity or dislocation. Moderate tricompartmental osteoarthrosis of the knee, incompletely characterized. No destructive bony lesions. Soft tissue planes are not suspicious. RIGHT ankle: No fracture deformity nor dislocation. The ankle mortise appears congruent and the tibiofibular syndesmosis intact. No destructive bony lesions. Soft tissue planes are non-suspicious.  RIGHT foot: Acute comminuted transverse fracture through distal aspect fifth proximal phalanx without intra-articular extension. Lateral angulation of distal bony fragments. No dislocation. Moderate plantar calcaneal spur. No destructive bony lesions. Soft tissue planes are not suspicious. IMPRESSION: Acute displaced fifth proximal phalanx fracture.  No dislocation. Moderate knee osteoarthrosis, incompletely characterized. Electronically Signed   By: Elon Alas M.D.   On: 07/02/2016 04:07   Ct Head Wo Contrast  Result Date: 07/02/2016 CLINICAL DATA:  56 year old female with fall. EXAM: CT HEAD WITHOUT CONTRAST CT CERVICAL SPINE WITHOUT CONTRAST TECHNIQUE: Multidetector CT imaging of the head and cervical spine was performed following the standard protocol without intravenous contrast. Multiplanar CT image reconstructions of the cervical spine were also generated. COMPARISON:  Head CT dated 07/31/2013 FINDINGS: CT HEAD FINDINGS Brain: No  evidence of acute infarction, hemorrhage, hydrocephalus, extra-axial collection or mass lesion/mass effect. Vascular: No hyperdense vessel or unexpected calcification. Skull: Normal. Negative for fracture or focal lesion. Sinuses/Orbits: No acute finding. Other: None CT CERVICAL SPINE FINDINGS Alignment: Normal. Skull base and vertebrae: No acute fracture. Soft tissues and spinal canal: No prevertebral fluid or swelling. No visible canal hematoma. Disc levels: Mild degenerative changes primarily at C5-C6 and C6-C7. Upper chest: Negative. Other: None IMPRESSION: 1. No acute intracranial pathology. 2. No acute/traumatic cervical spine pathology. Electronically Signed   By: Anner Crete M.D.   On: 07/02/2016 03:46   Ct Cervical Spine Wo Contrast  Result Date: 07/02/2016 CLINICAL DATA:  56 year old female with fall. EXAM: CT HEAD WITHOUT CONTRAST CT CERVICAL SPINE WITHOUT CONTRAST TECHNIQUE: Multidetector CT imaging of the head and cervical spine was performed  following the standard protocol without intravenous contrast. Multiplanar CT image reconstructions of the cervical spine were also generated. COMPARISON:  Head CT dated 07/31/2013 FINDINGS: CT HEAD FINDINGS Brain: No evidence of acute infarction, hemorrhage, hydrocephalus, extra-axial collection or mass lesion/mass effect. Vascular: No hyperdense vessel or unexpected calcification. Skull: Normal. Negative for fracture or focal lesion. Sinuses/Orbits: No acute finding. Other: None CT CERVICAL SPINE FINDINGS Alignment: Normal. Skull base and vertebrae: No acute fracture. Soft tissues and spinal canal: No prevertebral fluid or swelling. No visible canal hematoma. Disc levels: Mild degenerative changes primarily at C5-C6 and C6-C7. Upper chest: Negative. Other: None IMPRESSION: 1. No acute intracranial pathology. 2. No acute/traumatic cervical spine pathology. Electronically Signed   By: Anner Crete M.D.   On: 07/02/2016 03:46   Dg Shoulder Left  Result Date: 07/02/2016 CLINICAL DATA:  Trip and fall at home, television fell on patient. EXAM: LEFT SHOULDER - 2+ VIEW COMPARISON:  None. FINDINGS: Habitus limited examination. The humeral head is well-formed and located. The subacromial, glenohumeral and acromioclavicular joint spaces are intact. No destructive bony lesions. Soft tissue planes are non-suspicious. IMPRESSION: Negative habitus limited LEFT shoulder radiograph. Electronically Signed   By: Elon Alas M.D.   On: 07/02/2016 03:59   Dg Foot 2 Views Right  Result Date: 07/02/2016 CLINICAL DATA:  Right little toe fracture. EXAM: RIGHT FOOT - 2 VIEW COMPARISON:  Radiographs 2 hours prior FINDINGS: Unchanged alignment of acute displaced fifth toe proximal phalanx fracture. No definite intra-articular extension. No additional acute fracture. No other change from prior. Plantar calcaneal spur. IMPRESSION: Unchanged alignment of displaced fifth toe proximal phalanx fracture. Electronically Signed   By:  Jeb Levering M.D.   On: 07/02/2016 05:50   Dg Foot Complete Right  Result Date: 07/02/2016 CLINICAL DATA:  Trip and fall at home, television fell on patient. EXAM: RIGHT TIBIA AND FIBULA - 2 VIEW; RIGHT FOOT COMPLETE - 3+ VIEW; RIGHT ANKLE - COMPLETE 3+ VIEW COMPARISON:  None. FINDINGS: RIGHT tibia and fibula: No acute fracture deformity or dislocation. Moderate tricompartmental osteoarthrosis of the knee, incompletely characterized. No destructive bony lesions. Soft tissue planes are not suspicious. RIGHT ankle: No fracture deformity nor dislocation. The ankle mortise appears congruent and the tibiofibular syndesmosis intact. No destructive bony lesions. Soft tissue planes are non-suspicious. RIGHT foot: Acute comminuted transverse fracture through distal aspect fifth proximal phalanx without intra-articular extension. Lateral angulation of distal bony fragments. No dislocation. Moderate plantar calcaneal spur. No destructive bony lesions. Soft tissue planes are not suspicious. IMPRESSION: Acute displaced fifth proximal phalanx fracture.  No dislocation. Moderate knee osteoarthrosis, incompletely characterized. Electronically Signed   By: Elon Alas M.D.   On: 07/02/2016  04:07    Procedures Procedures (including critical care time)  NERVE BLOCK Performed by: Sherwood Gambler T Consent: Verbal consent obtained. Required items: required blood products, implants, devices, and special equipment available Time out: Immediately prior to procedure a "time out" was called to verify the correct patient, procedure, equipment, support staff and site/side marked as required.  Indication: reduction of fracture Nerve block body site: toe digital block  Preparation: Patient was prepped and draped in the usual sterile fashion. Needle gauge: 25 G Location technique: anatomical landmarks  Local anesthetic: lidocaine 2% w/o epi  Anesthetic total: 3 ml  Outcome: pain improved Patient tolerance:  Patient tolerated the procedure well with no immediate complications.  Reduction of dislocation Date/Time: 7:53 AM Performed by: Ephraim Hamburger Authorized by: Sherwood Gambler T Consent: Verbal consent obtained. Risks and benefits: risks, benefits and alternatives were discussed Consent given by: patient Required items: required blood products, implants, devices, and special equipment available Time out: Immediately prior to procedure a "time out" was called to verify the correct patient, procedure, equipment, support staff and site/side marked as required.  Patient sedated: no, digital block  Vitals: Vital signs were monitored during sedation. Patient tolerance: Patient tolerated the procedure well with no immediate complications. Joint: left fifth phalanx, foot Reduction technique: traction   Medications Ordered in ED Medications  HYDROmorphone (DILAUDID) injection 1 mg (1 mg Intravenous Given 07/02/16 0254)  ALPRAZolam (XANAX) tablet 1 mg (1 mg Oral Given 07/02/16 0254)  lidocaine (XYLOCAINE) 2 % injection 10 mL (10 mLs Intradermal Given 07/02/16 0452)  HYDROmorphone (DILAUDID) injection 1 mg (1 mg Intravenous Given 07/02/16 0452)     Initial Impression / Assessment and Plan / ED Course  I have reviewed the triage vital signs and the nursing notes.  Pertinent labs & imaging results that were available during my care of the patient were reviewed by me and considered in my medical decision making (see chart for details).     Patient with a mechanical fall at home and injury to multiple areas. Mild bruise over her left forearm and appears to have a dislocated fracture of her right fifth toe. I attempted to do a nerve block which worked well but was unable to significantly reduce the fracture. I was able to better align it and tried to buddy tape it to the fourth digit. However with poor reduction, she will differently need orthopedic referral for possible fracture repair in the  future. Her pain is significantly improved. Otherwise she was placed in a [place]. Her ankle swelling appears to be from sprain. Discussed follow-up with orthopedics and return precautions.  Final Clinical Impressions(s) / ED Diagnoses   Final diagnoses:  Closed fracture of phalanx of left fifth toe, initial encounter  Fall, initial encounter  Contusion of left forearm, initial encounter    New Prescriptions Discharge Medication List as of 07/02/2016  6:04 AM    START taking these medications   Details  oxyCODONE-acetaminophen (PERCOCET) 5-325 MG tablet Take 1-2 tablets by mouth every 4 (four) hours as needed for severe pain., Starting Sun 07/02/2016, Print       I personally performed the services described in this documentation, which was scribed in my presence. The recorded information has been reviewed and is accurate.     Sherwood Gambler, MD 07/02/16 [phone]

## 2016-07-02 NOTE — ED Notes (Signed)
Patient transported to CT 

## 2016-07-02 NOTE — ED Notes (Signed)
Provided patient with 2 new ice packs, repositioned in bed and given pillows for support. Pt states her sciatic nerve is now acting up. MD aware. Pt slowly becoming more calm and cooperative.

## 2016-07-02 NOTE — ED Notes (Signed)
Pt understood dc material. NAD noted. Scripts given at dc 

## 2016-07-05 ENCOUNTER — Telehealth: Payer: Self-pay | Admitting: Internal Medicine

## 2016-07-05 NOTE — Telephone Encounter (Signed)
Spoke with pt and informed her that it would be best for her to call Medical Records and go through the process of them sending all the records she needs, this would include any blood work or imaging or testing that may have been done. Pt was not at a good place to stop and take phone number. She states she will call us back

## 2016-07-06 NOTE — Telephone Encounter (Signed)
Waiting for a call back from the pt.

## 2016-07-07 NOTE — Telephone Encounter (Signed)
Called and spoke with pt and I advised her to have her new rhuematologist to send over a fax to medical records to get her last several OV notes. She will call and have this done.

## 2016-08-04 ENCOUNTER — Ambulatory Visit (INDEPENDENT_AMBULATORY_CARE_PROVIDER_SITE_OTHER): Payer: 59 | Admitting: Internal Medicine

## 2016-08-04 ENCOUNTER — Encounter: Payer: Self-pay | Admitting: Internal Medicine

## 2016-08-04 VITALS — BP 130/82 | HR 95 | Ht 68.0 in | Wt 369.0 lb

## 2016-08-04 DIAGNOSIS — I7782 Antineutrophilic cytoplasmic antibody (ANCA) vasculitis: Secondary | ICD-10-CM

## 2016-08-04 DIAGNOSIS — R5381 Other malaise: Secondary | ICD-10-CM | POA: Diagnosis not present

## 2016-08-04 DIAGNOSIS — I776 Arteritis, unspecified: Secondary | ICD-10-CM | POA: Diagnosis not present

## 2016-08-04 NOTE — Progress Notes (Signed)
Subjective:     Patient ID: [name], female   DOB: 04/01/1960, 56 y.o.   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 PCCM initial consult during hospitalization   08/05/2013 [facility] follow up  pt returns for a post hospital follow up .  She had a prolonged hospital admission .  Presented  with sudden onset shortness of breath & cough with associated hemoptysis and significant hypoxia.  CT of the chest w/  diffuse bilateral airspace opacities.  Serology showed a P ANCA of 1:640, Myeloperoxidase greater than 8. Acute respiratory failure thought related to [facility] from microscopic polyangiitis. It was felt that she would not tolerate lung bx given high O2 demands and she was treated with empiric abx, high dose steroids, weekly rituxan and plasmapheresis x 7 with slow improvement. 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 She was tx w/  atovaquone for PCP prophylaxis. Bacterial / Viral cultures negative. Course was complicated by anemia in setting alveolar hemorrhage, improved on iron supplementation. She was seen by Rheumatology w/ OP follow up set up.  She was very deconditioned and discharged to rehab .  Since discharge she remains very weak. Says she is able to walk with walker but gets very tired.  She says she was seen by Rheumatology last week and   Started on Imuran on 07/31/13 .  Remains on prednisone 40mg  daily  Records requested.  Reports will be discharged from [facility] on 7/30.   No further hemoptysis.    OV 09/02/2013  Chief Complaint  Patient presents with  . Follow-up    Pt c/o fatigue and SOB. Pt c/o cough and unable to bring mucous up. Pt states when she blows her nose it is brown in color and thick mucous.     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 She is being maintained on immuran. All cytotoxics through Dr Tobie Lords of rheumatology. She reports continued PT at home. SHe is not using walker as much. Reporting less dyspnea; able to do more without o2. She is NOT monitoring her o2 with pulse oximeter. She only decides on it on subjective basis but is mostly compliant with 4L [place]. She is frustrated by weight gain  CXR end jyly 2015: still with infiltrates but obesity obscuring picture  Labs end July 2015: creat normal but still anemiac at 10gm%.    Walking tes on Room air - after being on room air for 10 minutes - desaturated to 88% at 185 feet x 1 lap and got dyspneic as well   No results found for this basename: HGB, HCT, WBC, PLT,  in the last 168 hours    OV 12/09/2015  Chief Complaint  Patient presents with  . Follow-up    Pt states her SOB has worsened x 1.5 weeks. Pt c/o emesis x 4 days, c/o prod cough - pt unsure of color. Pt c/o constipation x 2 weeks. Pt c/o chest pressure when coughing.     Anca vasculitis patient in remission. Now complicated by obesity and dyspnea. In summer 2017 saw my colleague for atypical chest pains. CT angiogram ruled out pulmonary embolism. Lung fields are clear. She was then referred to  cardiology but she did not make it to the cardiology visit. Because she felt her chest pains are atypical and not consistent with myocardial symptoms. This is what she tells me now. In addition for the last 10 days she thinks that dyspnea is worse. Last 5 days or so she's been constipated for the last 2 days associated had increased nausea and vomiting. The only thing she's been able to regain today is crackers and some liquids. This no fever or hemoptysis or chills or rigors  Exhaled nitric oxide test today in the office 12/09/2015: feno 7ppb and normal  Course complicated by obsity, deconditioning  and inability to do rehab   OV 08/04/2016  Chief Complaint  Patient presents with  . Follow-up    Pt states her SOB is unchanged since last OV. Pt c/o dry cough - this has improved.    Follow-up Anka vasculitis - life-threatening disease in summer 2015.  There is a routine follow-up. She is doing well. She has changed her rheumatologist from Dr. Gavin Pound at [facility] to Dr Joellyn Rued at [facility]. She has been on Imuran for prevention of recurrent recurrence since 2015. She has had a two-month hiatus with that but is now back on it for the last 1 month. Overall this no hemoptysis or dysuria or any skin rash. Shortness of breath is stable and is because of obesity and physical deconditioning. She recently fractured her toe or ankle and has a boot on it. This no ankle swelling or calf swelling or hemoptysis or orthopnea paroxysmal nocturnal dyspnea. She now feels that she can continue to follow-up with rheumatology and instead of pulmonary as well.     has a past medical history of Anxiety associated with depression (09/23/2012); At high risk for falls (09/23/2012); Colitis; Diverticulitis of large intestine with perforation (09/23/2012); GERD (gastroesophageal reflux disease); History of endometriosis (1997); tobacco use, presenting hazards to health (09/23/2012); MPA (microscopic polyangiitis) ([place]) (2015); and Obesity (BMI 30-39.9) (09/23/2012).   reports that she quit smoking about 15 years ago. She has a 27.00 pack-year smoking history. She has never used smokeless tobacco.  Past Surgical History:  Procedure Laterality Date  . APPENDECTOMY    . BLADDER REPAIR    . LAPAROSCOPY  1997   dx of endometriosis  . LAPAROSCOPY  04/28/2000    Laparoscopy with lysis of adhesions, hysteroscopy, D&C.  Marland Kitchen TONSILLECTOMY      Allergies  Allergen Reactions  . Mucinex [Guaifenesin Er] Nausea And Vomiting  . Sulfa Antibiotics     Immunization History   Administered Date(s) Administered  . Influenza,inj,Quad PF,36+ Mos 11/03/2015  . Tdap 03/29/2012    Family History  Problem Relation Age of Onset  . Diabetes Mother        AODM  . Diabetes Father        AODM  . Hypertension Father   . Obesity Father   . Breast cancer Paternal Grandmother   . Birth defects Cousin   . CAD Other        [facility]     Current Outpatient Prescriptions:  .  ALPRAZolam (XANAX) 1 MG tablet, Take 1 tablet (1 mg total) by mouth 3 (three) times daily as needed for anxiety., Disp: 90 tablet, Rfl: 0 .  azaTHIOprine (IMURAN) 50 MG tablet, Take 150 mg by mouth daily. , Disp: , Rfl:  .  buPROPion (WELLBUTRIN XL) 300 MG 24 hr tablet, Take 450 mg by mouth daily. , Disp: , Rfl:  .  citalopram (CELEXA) 40 MG tablet, Take 40 mg by mouth daily., Disp: , Rfl:  .  dextromethorphan (DELSYM) 30 MG/5ML liquid, Take by mouth as needed for cough., Disp: , Rfl:  .  esomeprazole (NEXIUM) 40 MG capsule, Take 40 mg by mouth daily at 12 noon., Disp: , Rfl:  .  Multiple Vitamin (MULTIVITAMIN) tablet, Take 1 tablet by mouth daily., Disp: , Rfl:  .  Naproxen Sodium (ALEVE PO), Take by mouth as needed., Disp: , Rfl:  .  polyethylene glycol (MIRALAX / GLYCOLAX) packet, Take 17 g by mouth at bedtime. , Disp: , Rfl:    Review of Systems     Objective:   Physical Exam  Constitutional: She is oriented to person, place, and time. She appears well-developed and well-nourished. No distress.  obese  HENT:  Head: Normocephalic and atraumatic.  Right Ear: External ear normal.  Left Ear: External ear normal.  Mouth/Throat: Oropharynx is clear and moist. No oropharyngeal exudate.  Eyes: Pupils are equal, round, and reactive to light. Conjunctivae and EOM are normal. Right eye exhibits no discharge. Left eye exhibits no discharge. No scleral icterus.  Neck: Normal range of motion. Neck supple. No JVD present. No tracheal deviation present. No thyromegaly present.  Cardiovascular: Normal rate,  regular rhythm, normal heart sounds and intact distal pulses.  Exam reveals no gallop and no friction rub.   No murmur heard. Pulmonary/Chest: Effort normal and breath sounds normal. No respiratory distress. She has no wheezes. She has no rales. She exhibits no tenderness.  Abdominal: Soft. Bowel sounds are normal. She exhibits no distension and no mass. There is no tenderness. There is no rebound and no guarding.  Musculoskeletal: Normal range of motion. She exhibits no edema or tenderness.  Boot on right foot No calf tenderness  Lymphadenopathy:    She has no cervical adenopathy.  Neurological: She is alert and oriented to person, place, and time. She has normal reflexes. No cranial nerve deficit. She exhibits normal muscle tone. Coordination normal.  Skin: Skin is warm and dry. No rash noted. She is not diaphoretic. No erythema. No pallor.  Psychiatric: She has a normal mood and affect. Her behavior is normal. Judgment and thought content normal.  Vitals reviewed.  Vitals:   08/04/16 0905  BP: 130/82  Pulse: 95  SpO2: 98%  Weight: (!) 369 lb (167.4 kg)  Height: 5\' 8"  (1.727 m)    Estimated body mass index is 56.11 kg/m as calculated from the following:   Height as of this encounter: 5\' 8"  (1.727 m).   Weight as of this encounter: 369 lb (167.4 kg).     Assessment:       ICD-10-CM   1. 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 Physical deconditioning R53.81        Plan:      Appears in remission - anca vasculitis but will let Dr Lahoma Rocker at [facility] manage Otherwise weight reduction needed for overall health  No further followup at pulmonary but return as needed   Dr. Brand Males, M.D., [facility].C.P Pulmonary and Critical Care Medicine Staff Physician [facility] Pager: [phone], If no answer or between  15:00h - 7:00h: call [phone]  08/04/2016 9:21 AM

## 2016-08-04 NOTE — Patient Instructions (Addendum)
ICD-10-CM   1. 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 Physical deconditioning R53.81    Appears in remission - anca vasculitis but will let Dr Lahoma Rocker at [facility] manage Otherwise weight reduction needed for overall health  No further followup at pulmonary but return as needed

## 2016-08-31 ENCOUNTER — Ambulatory Visit: Payer: 59 | Admitting: Internal Medicine

## 2016-12-26 ENCOUNTER — Ambulatory Visit: Payer: 59 | Admitting: Neurology

## 2016-12-26 ENCOUNTER — Encounter: Payer: Self-pay | Admitting: Neurology

## 2016-12-26 VITALS — BP 162/87 | HR 100 | Ht 68.0 in | Wt 370.5 lb

## 2016-12-26 DIAGNOSIS — M5441 Lumbago with sciatica, right side: Secondary | ICD-10-CM | POA: Diagnosis not present

## 2016-12-26 DIAGNOSIS — M7989 Other specified soft tissue disorders: Secondary | ICD-10-CM | POA: Insufficient documentation

## 2016-12-26 DIAGNOSIS — G8929 Other chronic pain: Secondary | ICD-10-CM | POA: Diagnosis not present

## 2016-12-26 DIAGNOSIS — R202 Paresthesia of skin: Secondary | ICD-10-CM

## 2016-12-26 HISTORY — DX: Other specified soft tissue disorders: M79.89

## 2016-12-26 HISTORY — DX: Other chronic pain: G89.29

## 2016-12-26 HISTORY — DX: Paresthesia of skin: R20.2

## 2016-12-26 MED ORDER — GABAPENTIN 300 MG PO CAPS: 300.0000 mg | ORAL_CAPSULE | Freq: Three times a day (TID) | ORAL | 11 refills | Status: DC

## 2016-12-26 NOTE — Progress Notes (Signed)
PATIENT: [name] DOB: [birth year]  Chief Complaint  Patient presents with  . Numbness    Reports right foot numbness for the last several months. Symptoms worsened after having a right 5th toe fracture and wearing a boot.  She has history of ANCA vasculitis.  . Rheumatology    Lahoma Rocker, MD - referring MD  . PCP    [name] Sacramento, MD     HISTORICAL  [name] is a 56 year old female, seen in refer by her primary care doctor [name] for evaluation of numbness, initial evaluation was on December 26, 2016,  I reviewed and summarized the referring note, she had a history of depression, microscopic polyangiitis  She presented with sudden onset of breathing difficulty, cough with associated hemoptysis in June 2015, was admitted in ICU for extended period of time with diffuse alveolar hemorrhage, acute hypoxic respiratory failure secondary to microscopic polyangiitis, without kidney involvement, anemia secondary to diffuse alveolar hemorrhage, severe deconditioning, require prolonged rehabilitation.  She was treated with IV steroid, rituximab, plasmapheresis, Imuran was used as steroid sparing agent, prednisone was tapered of in 2017, repeat CT in 2017 was clear, still on Imuran 50 mg 3 tablets daily  since that event, she noticed numbness tingling in her feet 2015, gradually getting worse, she used to walk retailer store, but she could not stand for any extended period of time,  She also broke her right fifth toe in June 2018 after TV fell on her right foot, since then, she has constant swallowing of right foot, increased numbness to right ankle level,  She has chronic low back pain, radiating pain to right lower extremity, occasionally left foot paresthesia, but much less, she denies bilateral upper extremity paresthesia,   REVIEW OF SYSTEMS: Full 14 system review of systems performed and notable only for weight gain, fatigue, sweating legs, shortness of breath,  cough, snoring, incontinence, constipation, feeling hot, allergy, numbness, weakness, insomnia, sleepiness, depression, anxiety, not enough sleep, decreased energy  ALLERGIES: Allergies  Allergen Reactions  . Mucinex [Guaifenesin Er] Nausea And Vomiting  . Sulfa Antibiotics Nausea And Vomiting    HOME MEDICATIONS: Current Outpatient Medications  Medication Sig Dispense Refill  . ALPRAZolam (XANAX) 1 MG tablet Take 1 tablet (1 mg total) by mouth 3 (three) times daily as needed for anxiety. 90 tablet 0  . azaTHIOprine (IMURAN) 50 MG tablet Take 150 mg by mouth daily.     Marland Kitchen buPROPion (WELLBUTRIN XL) 300 MG 24 hr tablet Take 450 mg by mouth daily.     . citalopram (CELEXA) 40 MG tablet Take 40 mg by mouth daily.    Marland Kitchen dextromethorphan (DELSYM) 30 MG/5ML liquid Take by mouth as needed for cough.    . esomeprazole (NEXIUM) 40 MG capsule Take 40 mg by mouth daily at 12 noon.    . Multiple Vitamin (MULTIVITAMIN) tablet Take 1 tablet by mouth daily.    . Naproxen Sodium (ALEVE PO) Take by mouth as needed.    . polyethylene glycol (MIRALAX / GLYCOLAX) packet Take 17 g by mouth at bedtime.      No current facility-administered medications for this visit.     PAST MEDICAL HISTORY: Past Medical History:  Diagnosis Date  . 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[place])   . Anxiety associated with depression 09/23/2012  . At high risk for falls 09/23/2012   She has had a fractured ankle and tendon tear with falls over the last couple years.  . Colitis   . Diverticulitis  of large intestine with perforation 09/23/2012  . GERD (gastroesophageal reflux disease)   . History of endometriosis 1997  . Hx of tobacco use, presenting hazards to health 09/23/2012  . MPA (microscopic polyangiitis) ([place]) 2015  . Numbness   . Obesity (BMI 30-39.9) 09/23/2012    PAST SURGICAL HISTORY: Past Surgical History:  Procedure Laterality Date  . APPENDECTOMY    . BLADDER REPAIR    . LAPAROSCOPY  1997   dx of endometriosis  .  LAPAROSCOPY  04/28/2000    Laparoscopy with lysis of adhesions, hysteroscopy, D&C.  Marland Kitchen TONSILLECTOMY      FAMILY HISTORY: Family History  Problem Relation Age of Onset  . Diabetes Mother        AODM  . Diabetes Father        AODM  . Hypertension Father   . Obesity Father   . Breast cancer Paternal Grandmother   . Birth defects Cousin   . CAD Other        [facility]    SOCIAL HISTORY:  Social History   Socioeconomic History  . Marital status: Married    Spouse name: Not on file  . Number of children: 0  . Years of education: 80  . Highest education level: High school graduate  Social Needs  . Financial resource strain: Not on file  . Food insecurity - worry: Not on file  . Food insecurity - inability: Not on file  . Transportation needs - medical: Not on file  . Transportation needs - non-medical: Not on file  Occupational History  . Occupation: Disabled  Tobacco Use  . Smoking status: Former Smoker    Packs/day: 1.00    Years: 27.00    Pack years: 27.00    Last attempt to quit: 01/09/2001    Years since quitting: 15.9  . Smokeless tobacco: Never Used  Substance and Sexual Activity  . Alcohol use: No    Frequency: Never  . Drug use: No  . Sexual activity: Not on file  Other Topics Concern  . Not on file  Social History Narrative   Lives at home with her husband.   Right-handed.   1 cup caffeine per day, occasionally more.     PHYSICAL EXAM   Vitals:   12/26/16 1443  BP: (!) 162/87  Pulse: 100  Weight: (!) 370 lb 8 oz (168.1 kg)  Height: 5\' 8"  (1.727 m)    Not recorded      Body mass index is 56.33 kg/m.  PHYSICAL EXAMNIATION:  Gen: NAD, conversant, well nourised, obese, well groomed                     Cardiovascular: Regular rate rhythm, no peripheral edema, warm, nontender. Eyes: Conjunctivae clear without exudates or hemorrhage Neck: Supple, no carotid bruits. Pulmonary: Clear to auscultation bilaterally   NEUROLOGICAL EXAM:  MENTAL  STATUS: Speech:    Speech is normal; fluent and spontaneous with normal comprehension.  Cognition:     Orientation to time, place and person     Normal recent and remote memory     Normal Attention span and concentration     Normal Language, naming, repeating,spontaneous speech     Fund of knowledge   CRANIAL NERVES: CN II: Visual fields are full to confrontation. Fundoscopic exam is normal with sharp discs and no vascular changes. Pupils are round equal and briskly reactive to light. CN III, IV, VI: extraocular movement are normal. No ptosis. CN V: Facial  sensation is intact to pinprick in all 3 divisions bilaterally. Corneal responses are intact.  CN VII: Face is symmetric with normal eye closure and smile. CN VIII: Hearing is normal to rubbing fingers CN IX, X: Palate elevates symmetrically. Phonation is normal. CN XI: Head turning and shoulder shrug are intact CN XII: Tongue is midline with normal movements and no atrophy.  MOTOR: Her right foot looks swelling, erythematous, tender upon deep palpation, felt no significant bilateral lower extremity proximal and distal weakness  REFLEXES: Reflexes are 2+ and symmetric at the biceps, triceps, knees, and absent at ankles. Plantar responses are flexor.  SENSORY: Length dependent decrease to light touch, pinprick and vibratory sensation at the toes  COORDINATION: Rapid alternating movements and fine finger movements are intact. There is no dysmetria on finger-to-nose and heel-knee-shin.    GAIT/STANCE: Need to push up to get up from seated position, cautious, antalgic gait, also limited by her big body habitus  DIAGNOSTIC DATA (LABS, IMAGING, TESTING) - I reviewed patient records, labs, notes, testing and imaging myself where available.   ASSESSMENT AND PLAN  JOEL MERICLE is a 56 y.o. female   Persistent right foot swelling, paresthesia following right fifth toe fracture in June 2018 History of microscopic polyangiitis  June 2015 Long history of chronic low back pain, radiating pain to right lower extremity  Right lower extremity swelling discoloration following right fifth toe fracture, history of microscopic polyangiitis  Doppler study of lower extremity to rule out DVT, vascular insufficiency  Differentiation diagnoses also include peripheral neuropathy, right lumbosacral radiculopathy  EMG nerve conduction study  MRI of lumbar  Continue gabapentin 300 mg 3 times a day    Marcial Pacas, M.D. Ph.D.  [facility] [address] [place],  [address] Ph: [phone] Fax: [phone]  CC: [name] Sacramento, MD

## 2017-01-01 ENCOUNTER — Encounter (HOSPITAL_COMMUNITY): Payer: 59

## 2017-01-03 ENCOUNTER — Telehealth: Payer: Self-pay | Admitting: *Deleted

## 2017-01-03 ENCOUNTER — Ambulatory Visit (HOSPITAL_COMMUNITY)
Admission: RE | Admit: 2017-01-03 | Discharge: 2017-01-03 | Disposition: A | Discharge status: Home / Self Care | Payer: 59 | Source: Ambulatory Visit | Attending: Neurology | Admitting: Neurology

## 2017-01-03 DIAGNOSIS — G8929 Other chronic pain: Secondary | ICD-10-CM

## 2017-01-03 DIAGNOSIS — M5441 Lumbago with sciatica, right side: Secondary | ICD-10-CM | POA: Diagnosis present

## 2017-01-03 DIAGNOSIS — R202 Paresthesia of skin: Secondary | ICD-10-CM | POA: Diagnosis not present

## 2017-01-03 DIAGNOSIS — M7989 Other specified soft tissue disorders: Secondary | ICD-10-CM | POA: Diagnosis not present

## 2017-01-03 NOTE — Progress Notes (Signed)
RLE venous duplex prelim: no obvious DVT noted in the RLE. Landry Mellow, RDMS, RVT  Called results to [facility].

## 2017-01-03 NOTE — Telephone Encounter (Signed)
Sharee Pimple from [facility] calling with results - right leg negative for DVT.  She has also informed the patient of results.

## 2017-01-04 ENCOUNTER — Encounter (HOSPITAL_COMMUNITY): Payer: 59

## 2017-01-07 ENCOUNTER — Other Ambulatory Visit: Payer: 59

## 2017-01-19 ENCOUNTER — Other Ambulatory Visit: Payer: 59

## 2017-01-28 ENCOUNTER — Ambulatory Visit
Admission: RE | Admit: 2017-01-28 | Discharge: 2017-01-28 | Disposition: A | Discharge status: Home / Self Care | Payer: 59 | Source: Ambulatory Visit | Attending: Neurology | Admitting: Neurology

## 2017-01-28 DIAGNOSIS — G8929 Other chronic pain: Secondary | ICD-10-CM

## 2017-01-28 DIAGNOSIS — M7989 Other specified soft tissue disorders: Secondary | ICD-10-CM

## 2017-01-28 DIAGNOSIS — R202 Paresthesia of skin: Secondary | ICD-10-CM

## 2017-01-28 DIAGNOSIS — M5441 Lumbago with sciatica, right side: Principal | ICD-10-CM

## 2017-01-29 ENCOUNTER — Telehealth: Payer: Self-pay | Admitting: Neurology

## 2017-01-29 NOTE — Telephone Encounter (Signed)
  Please call patient, MRI of lumbar spine showed degenerative changes, most severe at L2-3, with potential impingement of right L2 nerve roots.  And potential impingement of right L5 nerve roots,  I will review MRIs with her at next follow-up visit February 02, 2017   IMPRESSION: Small right foraminal disc protrusion L2-3 with impingement of the right L2 nerve root.  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 Subarticular stenosis on the left.  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 Mild spinal stenosis.

## 2017-01-29 NOTE — Telephone Encounter (Signed)
Spoke to patient - she is aware of results and will keep her pending appt on 02/02/17 for NCV/EMG.  She is still having painful burning and tingling in her right leg but has only been using gabapentin sparingly.  It has been given to take 300mg , one capsule TID.  Instructed her to take it, as prescribed, to see if it will provide her benefit.

## 2017-02-02 ENCOUNTER — Ambulatory Visit (INDEPENDENT_AMBULATORY_CARE_PROVIDER_SITE_OTHER): Payer: Commercial Managed Care - PPO | Admitting: Neurology

## 2017-02-02 ENCOUNTER — Ambulatory Visit: Payer: Commercial Managed Care - PPO | Admitting: Neurology

## 2017-02-02 DIAGNOSIS — G8929 Other chronic pain: Secondary | ICD-10-CM

## 2017-02-02 DIAGNOSIS — M7989 Other specified soft tissue disorders: Secondary | ICD-10-CM | POA: Diagnosis not present

## 2017-02-02 DIAGNOSIS — R202 Paresthesia of skin: Secondary | ICD-10-CM

## 2017-02-02 DIAGNOSIS — M5441 Lumbago with sciatica, right side: Secondary | ICD-10-CM

## 2017-02-02 NOTE — Progress Notes (Signed)
PATIENT: [name] DOB: April 05, 1960  No chief complaint on file.    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  She was treated with IV steroid, rituximab, plasmapheresis, Imuran was used as steroid sparing agent, prednisone was tapered of in 2017, repeat CT in 2017 was clear, still on Imuran 50 mg 3 tablets daily  since that event, she noticed numbness tingling in her feet 2015, gradually getting worse, she used to walk retailer store, but she could not stand for any extended period of time,  She also broke her right fifth toe in June 2018 after TV fell on her right foot, since then, she has constant swallowing of right foot, increased numbness to right ankle level,  She has chronic low back pain, radiating pain to right lower extremity, occasionally left foot paresthesia, but much less, she denies bilateral upper extremity paresthesia,  UPDATE Feb 02 2017: Electrodiagnostic study today is normal, there is no evidence of peripheral neuropathy, no evidence of active lumbosacral radiculopathy.  We have personally reviewed MRI of lumbar spine in January 2019, small right foraminal disc protrusion at L2-3 with impingement of right L2 nerve roots, right side disc protrusion at L4-5 with impingement of right L5 nerve roots.  Patient complains of significant  right side low back pain, radiating pain from right back to right lateral thigh and, calf, right lateral 3 toes,  Doppler study of right lower extremity was negative Gabapentin 300 mg 3 times a day was helpful  REVIEW OF SYSTEMS: Full 14 system review of systems performed and notable only for weight gain, fatigue, sweating legs, shortness of breath, cough, snoring, incontinence, constipation, feeling hot, allergy, numbness, weakness, insomnia, sleepiness, depression, anxiety, not enough sleep, decreased energy  ALLERGIES: Allergies  Allergen Reactions  . Mucinex [Guaifenesin Er] Nausea And Vomiting  . Sulfa Antibiotics Nausea And Vomiting    HOME MEDICATIONS: Current Outpatient Medications  Medication Sig Dispense Refill  . ALPRAZolam (XANAX) 1 MG tablet Take 1 tablet (1 mg total) by mouth 3 (three) times daily as needed for anxiety. 90 tablet 0  . azaTHIOprine (IMURAN) 50 MG tablet Take 150 mg by mouth daily.     Marland Kitchen buPROPion (WELLBUTRIN XL) 300 MG 24 hr tablet Take 450 mg by mouth daily.     . citalopram (CELEXA) 40 MG tablet Take 40 mg by mouth daily.    Marland Kitchen dextromethorphan (DELSYM) 30 MG/5ML liquid Take by mouth as needed for cough.    . esomeprazole (NEXIUM) 40 MG capsule Take 40 mg by mouth daily at 12 noon.    . gabapentin (NEURONTIN) 300 MG capsule Take 1 capsule (300 mg total) by mouth 3 (three) times daily. 90 capsule 11  . Multiple Vitamin (MULTIVITAMIN) tablet Take 1 tablet by mouth daily.    . Naproxen Sodium (ALEVE PO) Take by mouth as needed.    . polyethylene glycol (MIRALAX / GLYCOLAX) packet Take 17 g by mouth at bedtime.  No current facility-administered medications for this visit.     PAST MEDICAL HISTORY: Past Medical History:  Diagnosis Date  . 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 Anxiety associated with depression 09/23/2012  . At high risk for falls 09/23/2012   She has had a fractured ankle and tendon tear with falls over the last couple years.  .  Colitis   . Diverticulitis of large intestine with perforation 09/23/2012  . GERD (gastroesophageal reflux disease)   . History of endometriosis 1997  . Hx of tobacco use, presenting hazards to health 09/23/2012  . MPA (microscopic polyangiitis) ([place]) 2015  . Numbness   . Obesity (BMI 30-39.9) 09/23/2012    PAST SURGICAL HISTORY: Past Surgical History:  Procedure Laterality Date  . APPENDECTOMY    . BLADDER REPAIR    . LAPAROSCOPY  1997   dx of endometriosis  . LAPAROSCOPY  04/28/2000    Laparoscopy with lysis of adhesions, hysteroscopy, D&C.  Marland Kitchen TONSILLECTOMY      FAMILY HISTORY: Family History  Problem Relation Age of Onset  . Diabetes Mother        AODM  . Diabetes Father        AODM  . Hypertension Father   . Obesity Father   . Breast cancer Paternal Grandmother   . Birth defects Cousin   . CAD Other        [facility]    SOCIAL HISTORY:  Social History   Socioeconomic History  . Marital status: Married    Spouse name: Not on file  . Number of children: 0  . Years of education: 64  . Highest education level: High school graduate  Social Needs  . Financial resource strain: Not on file  . Food insecurity - worry: Not on file  . Food insecurity - inability: Not on file  . Transportation needs - medical: Not on file  . Transportation needs - non-medical: Not on file  Occupational History  . Occupation: Disabled  Tobacco Use  . Smoking status: Former Smoker    Packs/day: 1.00    Years: 27.00    Pack years: 27.00    Last attempt to quit: 01/09/2001    Years since quitting: 16.0  . Smokeless tobacco: Never Used  Substance and Sexual Activity  . Alcohol use: No    Frequency: Never  . Drug use: No  . Sexual activity: Not on file  Other Topics Concern  . Not on file  Social History Narrative   Lives at home with her husband.   Right-handed.   1 cup caffeine per day, occasionally more.     PHYSICAL EXAM   There were no vitals filed for this visit.  Not  recorded      There is no height or weight on file to calculate BMI.  PHYSICAL EXAMNIATION:  Gen: NAD, conversant, well nourised, obese, well groomed                     Cardiovascular: Regular rate rhythm, no peripheral edema, warm, nontender. Eyes: Conjunctivae clear without exudates or hemorrhage Neck: Supple, no carotid bruits. Pulmonary: Clear to auscultation bilaterally   NEUROLOGICAL EXAM:  MENTAL STATUS: Speech:    Speech is normal; fluent and spontaneous with normal comprehension.  Cognition:     Orientation to time, place and person     Normal recent and remote memory     Normal Attention span and concentration     Normal Language, naming, repeating,spontaneous speech  Fund of knowledge   CRANIAL NERVES: CN II: Visual fields are full to confrontation. Fundoscopic exam is normal with sharp discs and no vascular changes. Pupils are round equal and briskly reactive to light. CN III, IV, VI: extraocular movement are normal. No ptosis. CN V: Facial sensation is intact to pinprick in all 3 divisions bilaterally. Corneal responses are intact.  CN VII: Face is symmetric with normal eye closure and smile. CN VIII: Hearing is normal to rubbing fingers CN IX, X: Palate elevates symmetrically. Phonation is normal. CN XI: Head turning and shoulder shrug are intact CN XII: Tongue is midline with normal movements and no atrophy.  MOTOR: Her right foot looks swelling, erythematous,  there was no significant bilateral lower extremity proximal distal muscle weakness, but there examination was limited due to her big body habitus.  REFLEXES: Reflexes are 2+ and symmetric at the biceps, triceps, knees, and trace at ankles. Plantar responses are flexor.  SENSORY: Length dependent decrease to light touch, pinprick and vibratory sensation at the toes  COORDINATION: Rapid alternating movements and fine finger movements are intact. There is no dysmetria on finger-to-nose and  heel-knee-shin.    GAIT/STANCE: Need to push up to get up from seated position, cautious, antalgic gait, also limited by her big body habitus  DIAGNOSTIC DATA (LABS, IMAGING, TESTING) - I reviewed patient records, labs, notes, testing and imaging myself where available.   ASSESSMENT AND PLAN  ANNIE SAEPHAN is a 57 y.o. female   Persistent right foot swelling, paresthesia following right fifth toe fracture in June 2018 History of microscopic polyangiitis in June 2015 Long history of chronic low back pain, radiating pain to right lower extremity  MRI of lumbar showed evidence of right foraminal disc protrusion at L2-3 with impingement of right L2 nerve roots, right side disc protrusion at L4-5 with impingement of right L5 nerve roots.  EMG nerve conduction study today showed no evidence of peripheral neuropathy, or active lumbosacral radiculopathy.  She is to continue gabapentin 300 mg 3 times a day, NSAIDs,  Referral to pain management, potential epidural injection,  Patient is also under the evaluation of rheumatologist Dr.Aryal, [place], for abnormal serum markers, there is no evidence of vasculitic peripheral neuropathy based on history, and electrodiagnostic study.   Marcial Pacas, M.D. Ph.D.  [facility] [address] [place],  [address] Ph: [phone] Fax: [phone]  CC: Christain Sacramento, MD

## 2017-02-02 NOTE — Procedures (Signed)
        Full Name: [name] Gender: Female MRN #: [id number] Date of Birth: [birth year]    Visit Date: 02/02/2017 10:53 Age: 57 Years 1 Months Old Examining Physician: Marcial Pacas, MD  Referring Physician: Krista Blue, MD History: 57 year old female, with history of chronic low back pain, now with worsening right-sided low back pain radiating pain to right lateral thigh and, calf, lateral 3 toes,  Summary of the tests: Nerve conduction study: Right sural, superficial peroneal sensory responses were normal.  Right peroneal to EDB and tibial motor responses were normal.   Electromyography: Selective needle examination of right lower extremity muscles and right lumbosacral paraspinals were normal.   Conclusion:  This is a normal study.  There is no evidence of large fiber peripheral neuropathy or active right lumbosacral radiculopathy.   ------------------------------- Marcial Pacas, M.D.  [facility] [place], [place] [address] Tel: [phone] Fax: [phone]        [facility]    Nerve / Sites Muscle Latency Ref. Amplitude Ref. Rel Amp Segments Distance Velocity Ref. Area    ms ms mV mV %  cm m/s m/s mVms  R Peroneal - EDB     Ankle EDB 6.0 ?6.5 3.3 ?2.0 100 Ankle - EDB 9   9.7     Fib head EDB 13.3  3.1  93.1 Fib head - Ankle 32 44 ?44 10.1     Pop fossa EDB 15.0  3.5  112 Pop fossa - Fib head 8 48 ?44 10.9         Pop fossa - Ankle      R Tibial - AH     Ankle AH 4.8 ?5.8 6.8 ?4.0 100 Ankle - AH 9   24.0     Pop fossa AH 13.6  6.0  87.6 Pop fossa - Ankle 36 41 ?41 21.5         SNC    Nerve / Sites Rec. Site Peak Lat Ref.  Amp Ref. Segments Distance    ms ms V V  cm  R Sural - Ankle (Calf)     Calf Ankle 3.1 ?4.4 6 ?6 Calf - Ankle 14  R Superficial peroneal - Ankle     Lat leg Ankle 3.5 ?4.4 7 ?6 Lat leg - Ankle 14         F  Wave    Nerve F Lat Ref.   ms ms  R Tibial - AH 54.9 ?56.0       EMG full       EMG Summary Table    Spontaneous MUAP Recruitment  Muscle IA Fib PSW Fasc Other Amp Dur. Poly Pattern  R. Tibialis anterior Normal None None None _______ Normal Normal Normal Normal  R. Tibialis posterior Normal None None None _______ Normal Normal Normal Normal  R. Peroneus longus Normal None None None _______ Normal Normal Normal Normal  R. Vastus lateralis Normal None None None _______ Normal Normal Normal Normal  R. Gastrocnemius (Medial head) Normal None None None _______ Normal Normal Normal Normal  R. Biceps femoris (long head) Normal None None None _______ Normal Normal Normal Normal  R. Gluteus medius Normal None None None _______ Normal Normal Normal Normal  R. Lumbar paraspinals (mid) Normal None None None _______ Normal Normal Normal Normal  R. Lumbar paraspinals (low) Normal None None None _______ Normal Normal Normal Normal

## 2017-02-05 MED ORDER — TRAMADOL HCL 50 MG PO TABS: 50.0000 mg | ORAL_TABLET | Freq: Three times a day (TID) | ORAL | 0 refills | Status: DC | PRN

## 2017-02-05 NOTE — Addendum Note (Signed)
Addended by: Desmond Lope on: 02/05/2017 04:47 PM   Modules accepted: Orders

## 2017-02-05 NOTE — Telephone Encounter (Signed)
Left message requesting a return call.

## 2017-02-05 NOTE — Telephone Encounter (Signed)
Per vo by Dr. Krista Blue, provide Tramadol 50mg , 1 tab q8h prn - #60 x 0.  Also, attempt to expedite her pain management referral.

## 2017-02-05 NOTE — Telephone Encounter (Signed)
She is agreeable to Tramadol and it has been faxed to the pharmacy.

## 2017-02-05 NOTE — Telephone Encounter (Signed)
Pt called she is having difficulty being able to feel her foot, it is numb and swelling also. Pt said the pain in the back is severe. Pt said since leaving the office on Friday the 3,4,5th digit is throbbing. Pt is taking gabapentin as directed. Please call to advise on the intermittent numbness in the foot at [phone].

## 2017-02-08 ENCOUNTER — Telehealth: Payer: Self-pay | Admitting: Neurology

## 2017-02-08 NOTE — Telephone Encounter (Signed)
Pt is wanting to discuss the referral, she said it was not sent to where it was discussed. Pt did not want to go into detail.

## 2017-02-12 NOTE — Telephone Encounter (Signed)
I spent 20 minutes telling patient to please call Dr. Mina Marble office back to schedule apt. Dr. Mina Marble office has been trying to schedule her an apt. Patient stated she would call them back and give them a chance.

## 2017-03-01 ENCOUNTER — Encounter (INDEPENDENT_AMBULATORY_CARE_PROVIDER_SITE_OTHER): Payer: Self-pay

## 2017-03-31 ENCOUNTER — Emergency Department (HOSPITAL_COMMUNITY): Payer: Commercial Managed Care - PPO

## 2017-03-31 ENCOUNTER — Encounter (HOSPITAL_COMMUNITY): Payer: Self-pay

## 2017-03-31 ENCOUNTER — Other Ambulatory Visit: Payer: Self-pay

## 2017-03-31 ENCOUNTER — Emergency Department (HOSPITAL_COMMUNITY)
Admission: EM | Admit: 2017-03-31 | Discharge: 2017-03-31 | Disposition: A | Discharge status: Home / Self Care | Payer: Commercial Managed Care - PPO | Attending: Emergency Medicine | Admitting: Emergency Medicine

## 2017-03-31 DIAGNOSIS — J0100 Acute maxillary sinusitis, unspecified: Secondary | ICD-10-CM | POA: Diagnosis not present

## 2017-03-31 DIAGNOSIS — Z87891 Personal history of nicotine dependence: Secondary | ICD-10-CM | POA: Insufficient documentation

## 2017-03-31 DIAGNOSIS — L03213 Periorbital cellulitis: Secondary | ICD-10-CM | POA: Diagnosis not present

## 2017-03-31 DIAGNOSIS — Z79899 Other long term (current) drug therapy: Secondary | ICD-10-CM | POA: Diagnosis not present

## 2017-03-31 DIAGNOSIS — H5711 Ocular pain, right eye: Secondary | ICD-10-CM | POA: Diagnosis present

## 2017-03-31 DIAGNOSIS — H109 Unspecified conjunctivitis: Secondary | ICD-10-CM

## 2017-03-31 LAB — CBC WITH DIFFERENTIAL/PLATELET
BASOS ABS: 0 10*3/uL (ref 0.0–0.1)
Basophils Relative: 0 %
EOS ABS: 0.3 10*3/uL (ref 0.0–0.7)
EOS PCT: 2 %
HCT: 40.2 % (ref 36.0–46.0)
Hemoglobin: 12.6 g/dL (ref 12.0–15.0)
Lymphocytes Relative: 11 %
Lymphs Abs: 1.8 10*3/uL (ref 0.7–4.0)
MCH: 26.4 pg (ref 26.0–34.0)
MCHC: 31.3 g/dL (ref 30.0–36.0)
MCV: 84.3 fL (ref 78.0–100.0)
MONO ABS: 0.6 10*3/uL (ref 0.1–1.0)
Monocytes Relative: 4 %
Neutro Abs: 13.5 10*3/uL — ABNORMAL HIGH (ref 1.7–7.7)
Neutrophils Relative %: 83 %
PLATELETS: 303 10*3/uL (ref 150–400)
RBC: 4.77 MIL/uL (ref 3.87–5.11)
RDW: 14.1 % (ref 11.5–15.5)
WBC: 16.2 10*3/uL — ABNORMAL HIGH (ref 4.0–10.5)

## 2017-03-31 LAB — BASIC METABOLIC PANEL
Anion gap: 11 (ref 5–15)
BUN: 12 mg/dL (ref 6–20)
CALCIUM: 8.6 mg/dL — AB (ref 8.9–10.3)
CO2: 24 mmol/L (ref 22–32)
Chloride: 100 mmol/L — ABNORMAL LOW (ref 101–111)
Creatinine, Ser: 1.06 mg/dL — ABNORMAL HIGH (ref 0.44–1.00)
GFR calc Af Amer: >60 mL/min (ref >60)
GFR, EST NON AFRICAN AMERICAN: 58 mL/min — AB (ref >60)
GLUCOSE: 141 mg/dL — AB (ref 65–99)
POTASSIUM: 3.6 mmol/L (ref 3.5–5.1)
SODIUM: 135 mmol/L (ref 135–145)

## 2017-03-31 MED ORDER — AMOXICILLIN-POT CLAVULANATE 875-125 MG PO TABS: 1.0000 | ORAL_TABLET | Freq: Two times a day (BID) | ORAL | 0 refills | Status: DC

## 2017-03-31 MED ORDER — CLINDAMYCIN PHOSPHATE 600 MG/50ML IV SOLN
600.0000 mg | Freq: Once | INTRAVENOUS | Status: AC
  Administered 2017-03-31: 600 mg via INTRAVENOUS
  Filled 2017-03-31: qty 50

## 2017-03-31 MED ORDER — FLUORESCEIN SODIUM 1 MG OP STRP
1.0000 | ORAL_STRIP | Freq: Once | OPHTHALMIC | Status: AC
  Administered 2017-03-31: 1 via OPHTHALMIC
  Filled 2017-03-31: qty 1

## 2017-03-31 MED ORDER — OXYCODONE-ACETAMINOPHEN 5-325 MG PO TABS: 1.0000 | ORAL_TABLET | ORAL | 0 refills | Status: DC | PRN

## 2017-03-31 MED ORDER — IOPAMIDOL (ISOVUE-300) INJECTION 61%
INTRAVENOUS | Status: AC
Start: 2017-03-31 — End: 2017-03-31
  Administered 2017-03-31: 75 mL via INTRAVENOUS
  Filled 2017-03-31: qty 75

## 2017-03-31 MED ORDER — HYDROCODONE-HOMATROPINE 5-1.5 MG/5ML PO SYRP
5.0000 mL | ORAL_SOLUTION | Freq: Once | ORAL | Status: AC
  Administered 2017-03-31: 5 mL via ORAL
  Filled 2017-03-31: qty 5

## 2017-03-31 MED ORDER — POLYMYXIN B-TRIMETHOPRIM 10000-0.1 UNIT/ML-% OP SOLN: 1.0000 [drp] | OPHTHALMIC | 0 refills | Status: DC

## 2017-03-31 MED ORDER — TETRACAINE HCL 0.5 % OP SOLN
2.0000 [drp] | Freq: Once | OPHTHALMIC | Status: AC
  Administered 2017-03-31: 2 [drp] via OPHTHALMIC
  Filled 2017-03-31: qty 4

## 2017-03-31 MED ORDER — FENTANYL CITRATE (PF) 100 MCG/2ML IJ SOLN
50.0000 ug | Freq: Once | INTRAMUSCULAR | Status: AC
  Administered 2017-03-31: 50 ug via INTRAVENOUS
  Filled 2017-03-31: qty 2

## 2017-03-31 MED ORDER — HYDROCODONE-HOMATROPINE 5-1.5 MG/5ML PO SYRP: 5.0000 mL | ORAL_SOLUTION | Freq: Four times a day (QID) | ORAL | 0 refills | Status: DC | PRN

## 2017-03-31 NOTE — ED Notes (Signed)
Pt. States that she cannot hold her eyes open, to be able to see visual acuity. Slit lamp is at bedside, and inquired about redoing CT with contrast. She was not able to sit still for exam and didn't have IV for contrast at the time of scan.

## 2017-03-31 NOTE — Discharge Instructions (Addendum)
Dr. Dareen Piano suspects your eye infection is from a sinus infection.   We will treat your sinus infection and eye infection with oral antibiotics and topical/eye drop antibiotics. For pain take 1 percocet daily. Bacterial infections of the eye are very contagious, hand washing is the only way to prevent spread.   Hycodan syrup for cough control, follow up with your primary care doctor for continued management of cough.   Follow up with Dr. Dareen Piano on Monday. You need evaluation with Ear, Nose and Throat for evaluation of sinusitis to ensure symptoms are improving.

## 2017-03-31 NOTE — ED Triage Notes (Signed)
Pt presents to the ed with complaints of redness and swelling present to the right eye. Pt states she has been getting over a cold and laryngitis. She was given an inhaler that she had an allergic reaction to 5 days ago that made her tongue swell. Her tongue swelling was going down and then her eye started swelling yesterday. Drainage present, tender to touch, pt unable to open eye. Reports vision loss in her eye as well.

## 2017-03-31 NOTE — ED Notes (Signed)
Pt and family states she has been forgotten about because its been hours waiting on someone to come in, explained that there was a critical patient and that she was not forgotten about but unfortunately we couldn't get in sooner, pa entered the room pt requesting pain meds.

## 2017-03-31 NOTE — ED Notes (Signed)
[place] lamp and [employer] at bedside, CT is in room for return scan.

## 2017-03-31 NOTE — ED Provider Notes (Signed)
[facility] EMERGENCY DEPARTMENT Provider Note   CSN: [id number] Arrival date & time: 03/31/17  1754     History   Chief Complaint No chief complaint on file.   HPI [name] is a 57 y.o. female is here for evaluation of right eye pain, redness, swelling and drainage onset 2 days ago.Associated with decreased vision to the right eye, states that she can see flashes of light in her vision only. Typically only uses readers. Noticed crusty, "boogers" along her lash line this morning and she was unable to open her eye. She has been using warm rag compresses without relief. She has pain described as throbbing and pressure along the eyeball, eyelids, worse with palpation, movements of her eye, coughing and bending forward. States the week before her symptoms started she had a lot of sinus drainage and postnasal drip, cough, and green rhinorrhea. Her voice is hoarse and she attributes this to the heavy coughing over the last week. No contact lens wear.   HPI  Past Medical History:  Diagnosis Date  . 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)   . Anxiety associated with depression 09/23/2012  . At high risk for falls 09/23/2012   She has had a fractured ankle and tendon tear with falls over the last couple years.  . Colitis   . Diverticulitis of large intestine with perforation 09/23/2012  . GERD (gastroesophageal reflux disease)   . History of endometriosis 1997  . Hx of tobacco use, presenting hazards to health 09/23/2012  . MPA (microscopic polyangiitis) ([place]) 2015  . Numbness   . Obesity (BMI 30-39.9) 09/23/2012    Patient Active Problem List   Diagnosis Date Noted  . Chronic right-sided low back pain with right-sided sciatica 12/26/2016  . Paresthesia 12/26/2016  . Right leg swelling 12/26/2016  . Physical deconditioning 04/30/2015  . Dyspnea 05/11/2014  . Leukocytosis 05/11/2014  . Chronic fatigue 05/11/2014  . Morbid obesity ([place]) 01/22/2014  . ILD (interstitial  lung disease) ([place]) 01/22/2014  . Acute bronchitis 01/08/2014  . Chronic respiratory failure with hypoxia ([place]) 11/04/2013  . 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  . Microscopic polyangiitis ([place]) 08/08/2013  . Hyperglycemia 07/24/2013  . GERD (gastroesophageal reflux disease) 07/24/2013  . Hypokalemia 07/20/2013  . 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[place]) 06/24/2013  . Hemoptysis 06/23/2013  . Acute blood loss anemia 06/23/2013  . Respiratory failure with hypoxia ([place]) 06/23/2013  . 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 Perforation of sigmoid colon - stercoral 09/27/2012  . Constipation, chronic 09/27/2012  . Obesity (BMI 30-39.9) 09/23/2012  . Anxiety associated with depression 09/23/2012  . Hx of tobacco use, presenting hazards to health 09/23/2012  . H/O gastroesophageal reflux (GERD) 09/23/2012  . At high risk for falls 09/23/2012    Past Surgical History:  Procedure Laterality Date  . APPENDECTOMY    . BLADDER REPAIR    . LAPAROSCOPY  1997   dx of endometriosis  . LAPAROSCOPY  04/28/2000    Laparoscopy with lysis of adhesions, hysteroscopy, D&C.  [facility] TONSILLECTOMY       OB History   None      Home Medications    Prior to Admission medications   Medication Sig Start Date End Date Taking? Authorizing Provider  ALPRAZolam Duanne Moron) 1 MG tablet Take 1 tablet (1 mg total) by mouth 3 (three) times daily as needed for anxiety. 08/22/13   Blanchie Serve, MD  amoxicillin-clavulanate (AUGMENTIN) 875-125 MG tablet Take 1 tablet by mouth every 12 (  twelve) hours. 03/31/17   Kinnie Feil, PA-C  azaTHIOprine (IMURAN) 50 MG tablet Take 150 mg by mouth daily.     [provider]  buPROPion (WELLBUTRIN XL) 300 MG 24 hr tablet Take 450 mg by mouth daily.  08/30/12   [provider]  citalopram (CELEXA) 40 MG tablet Take 40 mg by mouth daily.    [provider]  dextromethorphan (DELSYM) 30 MG/5ML  liquid Take by mouth as needed for cough.    [provider]  esomeprazole (NEXIUM) 40 MG capsule Take 40 mg by mouth daily at 12 noon.    [provider]  gabapentin (NEURONTIN) 300 MG capsule Take 1 capsule (300 mg total) by mouth 3 (three) times daily. 12/26/16   Marcial Pacas, MD  HYDROcodone-homatropine [facility]) 5-1.5 MG/5ML syrup Take 5 mLs by mouth every 6 (six) hours as needed for cough. FOR DISRUPTIVE NIGHT TIME COUGH AND BODY ACHES 03/31/17   Kinnie Feil, PA-C  Multiple Vitamin (MULTIVITAMIN) tablet Take 1 tablet by mouth daily.    [provider]  Naproxen Sodium (ALEVE PO) Take by mouth as needed.    [provider]  oxyCODONE-acetaminophen (PERCOCET/ROXICET) 5-325 MG tablet Take 1 tablet by mouth every 4 (four) hours as needed for severe pain. 03/31/17   Kinnie Feil, PA-C  polyethylene glycol (MIRALAX / GLYCOLAX) packet Take 17 g by mouth at bedtime.     [provider]  traMADol (ULTRAM) 50 MG tablet Take 1 tablet (50 mg total) by mouth every 8 (eight) hours as needed. 02/05/17   Marcial Pacas, MD  trimethoprim-polymyxin b (POLYTRIM) ophthalmic solution Place 1 drop into both eyes every 4 (four) hours. 03/31/17   Kinnie Feil, PA-C    Family History Family History  Problem Relation Age of Onset  . Diabetes Mother        AODM  . Diabetes Father        AODM  . Hypertension Father   . Obesity Father   . Breast cancer Paternal Grandmother   . Birth defects Cousin   . CAD Other        [facility]    Social History Social History   Tobacco Use  . Smoking status: Former Smoker    Packs/day: 1.00    Years: 27.00    Pack years: 27.00    Last attempt to quit: 01/09/2001    Years since quitting: 16.2  . Smokeless tobacco: Never Used  Substance Use Topics  . Alcohol use: No    Frequency: Never  . Drug use: No     Allergies   Mucinex [guaifenesin er] and Sulfa antibiotics   Review of Systems Review of Systems  HENT:  Positive for postnasal drip and rhinorrhea (improving).   Eyes: Positive for pain, discharge, redness and visual disturbance.  Respiratory: Positive for cough.   All other systems reviewed and are negative.    Physical Exam Updated Vital Signs BP 116/89   Pulse 96   Temp 99.4 F (37.4 C) (Oral)   Resp 15   Ht 5\' 8"  (1.727 m)   Wt (!) 172.8 kg (381 lb)   SpO2 100%   BMI 57.93 kg/m   Physical Exam  Constitutional: She is oriented to person, place, and time. She appears well-developed and well-nourished. No distress.  Keeps eyes closed during exam, appears anxious.   HENT:  Head: Normocephalic and atraumatic.  Right Ear: External ear normal.  Left Ear: External ear normal.  Nose: Nose  normal.  Eyes:  Mild edema to right eye lids, tenderness along eyelids and globe even with light touch. Patient is anxious during exam. Eyelashes are matted shut. Yellow thin drainage noted to inner corner of right eye. Diffuse conjunctival/scleral injection to right eye. Pain with right EOMs otherwise PERRL and EOMs intact.   Neck: Normal range of motion. Neck supple.  Cardiovascular: Normal rate, regular rhythm and normal heart sounds.  No murmur heard. Pulmonary/Chest: Effort normal and breath sounds normal. She has no wheezes.  Musculoskeletal: Normal range of motion. She exhibits no deformity.  Neurological: She is alert and oriented to person, place, and time.  Skin: Skin is warm and dry. Capillary refill takes less than 2 seconds.  Psychiatric: She has a normal mood and affect. Her behavior is normal. Judgment and thought content normal.  Nursing note and vitals reviewed.        ED Treatments / Results  Labs (all labs ordered are listed, but only abnormal results are displayed) Labs Reviewed  CBC WITH DIFFERENTIAL/PLATELET - Abnormal; Notable for the following components:      Result Value   WBC 16.2 (*)    Neutro Abs 13.5 (*)    All other components within normal limits  BASIC  METABOLIC PANEL - Abnormal; Notable for the following components:   Chloride 100 (*)    Glucose, Bld 141 (*)    Creatinine, Ser 1.06 (*)    Calcium 8.6 (*)    GFR calc non Af Amer 58 (*)    All other components within normal limits    EKG None  Radiology Ct Maxillofacial W Contrast  Result Date: 03/31/2017 CLINICAL DATA:  57 y/o  F; right eye is swollen. EXAM: CT MAXILLOFACIAL WITH CONTRAST TECHNIQUE: Multidetector CT imaging of the maxillofacial structures was performed with intravenous contrast. Multiplanar CT image reconstructions were also generated. CONTRAST:  39mL ISOVUE-300 IOPAMIDOL (ISOVUE-300) INJECTION 61% COMPARISON:  07/02/2016 CT head. FINDINGS: Osseous: No fracture or mandibular dislocation. No destructive process. Orbits: Mild right periorbital soft tissue edema. No orbital inflammatory or traumatic process. No orbital mass identified. Sinuses: Moderate diffuse paranasal sinus mucosal thickening. Normal aeration of mastoid air cells. Soft tissues: Mildly enlarged upper posterior cervical lymph nodes bilaterally. Limited intracranial: No significant or unexpected finding. IMPRESSION: 1. Mild right periorbital soft tissue swelling. No abscess or orbital inflammatory process. 2. Moderate diffuse paranasal sinus disease. 3. Mildly enlarged upper posterior cervical lymph nodes, likely reactive. Electronically Signed   By: Kristine Garbe M.D.   On: 03/31/2017 20:56    Procedures Procedures (including critical care time)  Medications Ordered in ED Medications  clindamycin (CLEOCIN) IVPB 600 mg (600 mg Intravenous New Bag/Given 03/31/17 2307)  fentaNYL (SUBLIMAZE) injection 50 mcg (50 mcg Intravenous Given 03/31/17 1939)  fluorescein ophthalmic strip 1 strip (1 strip Both Eyes Given 03/31/17 1939)  tetracaine (PONTOCAINE) 0.5 % ophthalmic solution 2 drop (2 drops Both Eyes Given 03/31/17 1940)  iopamidol (ISOVUE-300) 61 % injection (75 mLs Intravenous Contrast Given 03/31/17  2027)  HYDROcodone-homatropine (HYCODAN) 5-1.5 MG/5ML syrup 5 mL (5 mLs Oral Given 03/31/17 2018)     Initial Impression / Assessment and Plan / ED Course  I have reviewed the triage vital signs and the nursing notes.  Pertinent labs & imaging results that were available during my care of the patient were reviewed by me and considered in my medical decision making (see chart for details).  Clinical Course as of Apr 01 2331  Sat Mar 31, 2017  2108  WBC(!): 16.2 [CG]  2108 Creatinine(!): 1.06 [CG]  2108 IMPRESSION: 1. Mild right periorbital soft tissue swelling. No abscess or orbital inflammatory process. 2. Moderate diffuse paranasal sinus disease. 3. Mildly enlarged upper posterior cervical lymph nodes, likely reactive.  CT Maxillofacial W Contrast [CG]    Clinical Course User Index [CG] Kinnie Feil, PA-C   Ddx include bacterial conjunctivitis, peri/post orbital cellulitis. Will obtain screening labs, perform eye exam, analgesia, and CT.   2000: CT with mild periorbital soft tissue swelling and moderate paranasal sinus disease. IOP R 22, IOP L 24. No fluorescein uptake. Acuity L 20/60, R 20/160, bilateral 20/80.   2254: Pt shared with Dr. Tamera Punt. Will consult ophthalmology. Clindamycin IV started.   Final Clinical Impressions(s) / ED Diagnoses   2330: Spoke to Dr. Dareen Piano (ophthalmology) who recommends topical abx for conjunctivitis and systemic abx for sinusitis, likely the culprit.  He will see patient in office on Monday. Also recommending ENT f/u. Discussed plan with pt who is in agreement. Pt discussed with Dr. Tamera Punt.  Final diagnoses:  Bacterial conjunctivitis  Acute non-recurrent maxillary sinusitis  Periorbital cellulitis of right eye    ED Discharge Orders        Ordered    trimethoprim-polymyxin b (POLYTRIM) ophthalmic solution  Every 4 hours     03/31/17 2323    amoxicillin-clavulanate (AUGMENTIN) 875-125 MG tablet  Every 12 hours     03/31/17 2323     oxyCODONE-acetaminophen (PERCOCET/ROXICET) 5-325 MG tablet  Every 4 hours PRN     03/31/17 2323    HYDROcodone-homatropine (HYCODAN) 5-1.5 MG/5ML syrup  Every 6 hours PRN     03/31/17 2323       Kinnie Feil, PA-C 03/31/17 2333    Malvin Johns, MD 03/31/17 [phone]

## 2017-03-31 NOTE — ED Notes (Addendum)
Pt. Used mostly the left eye when reading for bilateral acuity. Pt. Stated right eye is very blurry, the right eye is able to slightly open by self, but needed assistance with reading acuity scale.  FYI

## 2017-03-31 NOTE — ED Notes (Signed)
Pt not in room.

## 2017-04-12 ENCOUNTER — Ambulatory Visit: Payer: 59 | Admitting: Internal Medicine

## 2017-04-12 ENCOUNTER — Encounter: Payer: Self-pay | Admitting: Internal Medicine

## 2017-04-12 VITALS — BP 122/82 | HR 94 | Ht 68.5 in | Wt 377.0 lb

## 2017-04-12 DIAGNOSIS — I776 Arteritis, unspecified: Secondary | ICD-10-CM

## 2017-04-12 DIAGNOSIS — R0602 Shortness of breath: Secondary | ICD-10-CM

## 2017-04-12 DIAGNOSIS — I7782 Antineutrophilic cytoplasmic antibody (ANCA) vasculitis: Secondary | ICD-10-CM

## 2017-04-12 DIAGNOSIS — R490 Dysphonia: Secondary | ICD-10-CM

## 2017-04-12 NOTE — Progress Notes (Signed)
Subjective:     Patient ID: [name], female   DOB: 07-03-1960, 57 y.o.   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 PCCM initial consult during hospitalization   08/05/2013 [facility] follow up  pt returns for a post hospital follow up .  She had a prolonged hospital admission .  Presented  with sudden onset shortness of breath & cough with associated hemoptysis and significant hypoxia.  CT of the chest w/  diffuse bilateral airspace opacities.  Serology showed a P ANCA of 1:640, Myeloperoxidase greater than 8. Acute respiratory failure thought related to [facility] from microscopic polyangiitis. It was felt that she would not tolerate lung bx given high O2 demands and she was treated with empiric abx, high dose steroids, weekly rituxan and plasmapheresis x 7 with slow improvement. 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 She was tx w/  atovaquone for PCP prophylaxis. Bacterial / Viral cultures negative. Course was complicated by anemia in setting alveolar hemorrhage, improved on iron supplementation. She was seen by Rheumatology w/ OP follow up set up.  She was very deconditioned and discharged to rehab .  Since discharge she remains very weak. Says she is able to walk with walker but gets very tired.  She says she was seen by Rheumatology last week and   Started on Imuran on 07/31/13 .  Remains on prednisone 40mg  daily  Records requested.  Reports will be discharged from [place] on 7/30.   No further hemoptysis.    OV 09/02/2013  Chief Complaint  Patient presents with  . Follow-up    Pt c/o fatigue and SOB. Pt c/o cough and unable to bring mucous up. Pt states when she blows her nose it is brown in color and thick mucous.     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 She is being maintained on immuran. All cytotoxics through Dr Tobie Lords of rheumatology. She reports continued PT at home. SHe is not using walker as much. Reporting less dyspnea; able to do more without o2. She is NOT monitoring her o2 with pulse oximeter. She only decides on it on subjective basis but is mostly compliant with 4L [place]. She is frustrated by weight gain  CXR end jyly 2015: still with infiltrates but obesity obscuring picture  Labs end July 2015: creat normal but still anemiac at 10gm%.    Walking tes on Room air - after being on room air for 10 minutes - desaturated to 88% at 185 feet x 1 lap and got dyspneic as well   No results found for this basename: HGB, HCT, WBC, PLT,  in the last 168 hours    OV 12/09/2015  Chief Complaint  Patient presents with  . Follow-up    Pt states her SOB has worsened x 1.5 weeks. Pt c/o emesis x 4 days, c/o prod cough - pt unsure of color. Pt c/o constipation x 2 weeks. Pt c/o chest pressure when coughing.     Anca vasculitis patient in remission. Now complicated by obesity and dyspnea. In summer 2017 saw my colleague for atypical chest pains. CT angiogram ruled out pulmonary embolism. Lung fields are clear. She was then referred  to cardiology but she did not make it to the cardiology visit. Because she felt her chest pains are atypical and not consistent with myocardial symptoms. This is what she tells me now. In addition for the last 10 days she thinks that dyspnea is worse. Last 5 days or so she's been constipated for the last 2 days associated had increased nausea and vomiting. The only thing she's been able to regain today is crackers and some liquids. This no fever or hemoptysis or chills or rigors  Exhaled nitric oxide test today in the office 12/09/2015: feno 7ppb and normal  Course complicated by obsity, deconditioning  and inability to do rehab   OV 08/04/2016  Chief Complaint  Patient presents with  . Follow-up    Pt states her SOB is unchanged since last OV. Pt c/o dry cough - this has improved.    Follow-up Anka vasculitis - life-threatening disease in summer 2015.  There is a routine follow-up. She is doing well. She has changed her rheumatologist from Dr. Gavin Pound at [facility] to Dr Joellyn Rued at [facility]. She has been on Imuran for prevention of recurrent recurrence since 2015. She has had a two-month hiatus with that but is now back on it for the last 1 month. Overall this no hemoptysis or dysuria or any skin rash. Shortness of breath is stable and is because of obesity and physical deconditioning. She recently fractured her toe or ankle and has a boot on it. This no ankle swelling or calf swelling or hemoptysis or orthopnea paroxysmal nocturnal dyspnea. She now feels that she can continue to follow-up with rheumatology and instead of pulmonary as well.   OV 04/12/2017  Chief Complaint  Patient presents with  . Follow-up    hoarseness, increased SOB, cough occasionally producing with green mucus, nasal discharge same color with BRB, PND x2 months, recent ED vsisit 73/22    57 year old morbidly obese female with previous history of life-threatening Anka associated vasculitis.  She was discharged from pulmonary follow-up because her shortness of breath was largely due to obesity.  She was following with [facility] Dr. Kathlene November for this.  She has been placed on maintenance Imuran.  Recently she developed a right red eye.  Ended up in the ER.  Subsequently saw specialist Dr. Armanda Heritage and diagnosed with peripheral ulcerative keratitis.  As best as I can gather from patient's history there is concerned that her vasculitis is affecting her eye.  She is upset that she has had this problem before in the left eye.  She is now worried that the  vasculitis affecting her lungs and therefore she is here.  She is also having hoarseness of voice and cough for the last 3 months.  She wants this evaluated.  She is asking for cough medications but is willing to see ENT before that.  She did indicate there is consideration of Rituxan being started by her rheumatologist.  Review of the rheumatologist notes indicate that even as recently as February her MPO antibody continues to be positive and is incorporated in my note below.    CT sinus 03/31/17  IMPRESSION: 1. Mild right periorbital soft tissue swelling. No abscess or orbital inflammatory process. 2. Moderate diffuse paranasal sinus disease. 3. Mildly enlarged upper posterior cervical lymph nodes, likely reactive. Electronically Signed   By: Kristine Garbe M.D.     Results for MYRNA, VONSEGGERN (MRN [id number]) as of 04/12/2017 11:54  Ref. Range 03/31/2017  19:41  Eosinophils Absolute Latest Ref Range: 0.0 - 0.7 K/uL 0.3     On: 03/31/2017 20:56 Results for TYTIANNA, GREENLEY (MRN [id number]) as of 04/12/2017 11:54  Ref. Range 03/31/2017 19:41  Creatinine Latest Ref Range: 0.44 - 1.00 mg/dL 1.06 (H)  Results for MAZELL, AYLESWORTH (MRN [id number]) as of 04/12/2017 11:54  Ref. Range 03/31/2017 19:41  Hemoglobin Latest Ref Range: 12.0 - 15.0 g/dL 12.6    Results for BEYONCE, SAWATZKY (MRN [id number]) as of 04/12/2017 11:54  Ref. Range 06/23/2013 23:45 06/24/2013 03:04 07/17/2013 03:50 03/08/17 at [facility]  Myeloperoxidase Abs Latest Ref Range: <1.0 AI  >8.0 (H)  4.5 (H) 50.2  Serine Protease 3 Latest Ref Range: <1.0 AI  <1.0  <1.0 < 3.5    RA Latex Turbid. Latest Ref Range: <=14 IU/mL <10     c-ANCA Screen Latest Ref Range: NEGATIVE  NEGATIVE   <1:20  Cytoplasmic (C-ANCA) Latest Ref Range: <0:10  NOT APPLICABLE     p-ANCA Screen Latest Ref Range: NEGATIVE  POSITIVE (A)   1:40     has a past medical history of 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[place]), Anxiety associated with depression (09/23/2012), At  high risk for falls (09/23/2012), Colitis, Diverticulitis of large intestine with perforation (09/23/2012), GERD (gastroesophageal reflux disease), History of endometriosis (1997), tobacco use, presenting hazards to health (09/23/2012), MPA (microscopic polyangiitis) ([place]) (2015), Numbness, and Obesity (BMI 30-39.9) (09/23/2012).   reports that she quit smoking about 16 years ago. She has a 27.00 pack-year smoking history. She has never used smokeless tobacco.  Past Surgical History:  Procedure Laterality Date  . APPENDECTOMY    . BLADDER REPAIR    . LAPAROSCOPY  1997   dx of endometriosis  . LAPAROSCOPY  04/28/2000    Laparoscopy with lysis of adhesions, hysteroscopy, D&C.  [facility] Kitchen TONSILLECTOMY      Allergies  Allergen Reactions  . Mucinex [Guaifenesin Er] Nausea And Vomiting  . Sulfa Antibiotics Nausea And Vomiting    Immunization History  Administered Date(s) Administered  . Influenza Split 10/09/2016  . Influenza,inj,Quad PF,6+ Mos 11/03/2015  . Tdap 03/29/2012    Family History  Problem Relation Age of Onset  . Diabetes Mother        AODM  . Diabetes Father        AODM  . Hypertension Father   . Obesity Father   . Breast cancer Paternal Grandmother   . Birth defects Cousin   . CAD Other        [facility]     Current Outpatient Medications:  .  ALPRAZolam (XANAX) 1 MG tablet, Take 1 tablet (1 mg total) by mouth 3 (three) times daily as needed for anxiety., Disp: 90 tablet, Rfl: 0 .  azaTHIOprine (IMURAN) 50 MG tablet, Take 150 mg by mouth daily. , Disp: , Rfl:  .  buPROPion (WELLBUTRIN XL) 300 MG 24 hr tablet, Take 450 mg by mouth daily. , Disp: , Rfl:  .  citalopram (CELEXA) 40 MG tablet, Take 40 mg by mouth daily., Disp: , Rfl:  .  dextromethorphan (DELSYM) 30 MG/5ML liquid, Take by mouth as needed for cough., Disp: , Rfl:  .  esomeprazole (NEXIUM) 40 MG capsule, Take 40 mg by mouth daily at 12 noon., Disp: , Rfl:  .  Fluticasone-Salmeterol (ADVAIR) 100-50 MCG/DOSE AEPB,  Inhale 1 puff into the lungs 2 (two) times daily., Disp: , Rfl:  .  gabapentin (NEURONTIN) 300 MG capsule, Take 1 capsule (300 mg total) by mouth 3 (three) times  daily., Disp: 90 capsule, Rfl: 11 .  HYDROcodone-homatropine (HYCODAN) 5-1.5 MG/5ML syrup, Take 5 mLs by mouth every 6 (six) hours as needed for cough. FOR DISRUPTIVE NIGHT TIME COUGH AND BODY ACHES, Disp: 120 mL, Rfl: 0 .  Multiple Vitamin (MULTIVITAMIN) tablet, Take 1 tablet by mouth daily., Disp: , Rfl:  .  Naproxen Sodium (ALEVE PO), Take by mouth as needed., Disp: , Rfl:  .  polyethylene glycol (MIRALAX / GLYCOLAX) packet, Take 17 g by mouth at bedtime. , Disp: , Rfl:    Review of Systems     Objective:   Physical Exam     Assessment:       ICD-10-CM   1. 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 SOB (shortness of breath) R06.02   3. Hoarseness of voice R49.0     General Appearance:    obese  Head:    Normocephalic, without obvious abnormality, atraumatic  Eyes:    PERRL - YES, conjunctiva/corneas - Rt eye red      Ears:    Normal external ear canals, both ears  Nose:   NG tube - no  Throat:  ETT TUBE - no , OG tube - no but VOICE HOARSE +  Neck:   Supple,  No enlargement/tenderness/nodules     Lungs:     Clear to auscultation bilaterally,   Chest wall:    No deformity  Heart:    S1 and S2 normal, no murmur, CVP - no.  Pressors - no  Abdomen:     Soft, no masses, no organomegaly. OBESE  Genitalia:    Not done  Rectal:   not done  Extremities:   Extremities- intact     Skin:   Intact in exposed areas .      Neurologic:   Sedation - none -> RASS - na . Moves all 4s - yes. CAM-ICU - neg . Orientation - x3+        Plan:        ICD-10-CM   1. 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 SOB (shortness of breath) R06.02   3. Hoarseness of voice R49.0      Need to see if vasculitis is affecting lungs or vocal cords  Do HRCT REfer eNT for hoarseness   Plan Return to see after above  If determined  that vasculitis is what is causing right eye keratitis or lung issues, rituxan by Dr Kathlene November should be a consideraton    > 50% of this > 25 min visit spent in face to face counseling or coordination of care    Dr. Brand Males, M.D., [facility].C.P Pulmonary and Critical Care Medicine Staff Physician, [place] [facility] Director - Interstitial Lung Disease  Program  Pulmonary [place] at [place], [place], [address]  Pager: [phone], If no answer or between  15:00h - 7:00h: call [phone] Telephone: [phone]

## 2017-04-12 NOTE — Patient Instructions (Signed)
ICD-10-CM   1. 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 SOB (shortness of breath) R06.02   3. Hoarseness of voice R49.0    Need to see if vasculitis is affecting lungs or vocal cords  Do HRCT REfer eNT for hoarseness   Plan Return to see after above  If determined that vasculitis is what is causing right eye keratitis or lung issues, rituxan by Dr Kathlene November should be a consideraton

## 2017-04-16 ENCOUNTER — Telehealth: Payer: Self-pay | Admitting: Internal Medicine

## 2017-04-16 DIAGNOSIS — R0602 Shortness of breath: Secondary | ICD-10-CM

## 2017-04-16 DIAGNOSIS — I2510 Atherosclerotic heart disease of native coronary artery without angina pectoris: Secondary | ICD-10-CM

## 2017-04-16 DIAGNOSIS — I2584 Coronary atherosclerosis due to calcified coronary lesion: Secondary | ICD-10-CM

## 2017-04-16 NOTE — Telephone Encounter (Signed)
Called and spoke with Dr. Patrice Paradise and she stated that she just wanted to pass some information on to MR. If MR needed to call her he could.  She wanted MR to know that due to patients vasculitis it is causing her eye to melt away. She has patient on 50 mg of oral prednisone. She is wanting the results from the CT scan once it come back. Routing this to MR for [facility]

## 2017-04-17 ENCOUNTER — Inpatient Hospital Stay: Admission: RE | Admit: 2017-04-17 | Payer: Commercial Managed Care - PPO | Source: Ambulatory Visit

## 2017-04-25 NOTE — Telephone Encounter (Signed)
MR please advise if there is anything we need to specify to Dr. Patrice Paradise about this patient.

## 2017-04-26 ENCOUNTER — Ambulatory Visit (INDEPENDENT_AMBULATORY_CARE_PROVIDER_SITE_OTHER)
Admission: RE | Admit: 2017-04-26 | Discharge: 2017-04-26 | Disposition: A | Discharge status: Home / Self Care | Payer: Commercial Managed Care - PPO | Source: Ambulatory Visit | Attending: Internal Medicine | Admitting: Internal Medicine

## 2017-04-26 DIAGNOSIS — I776 Arteritis, unspecified: Secondary | ICD-10-CM | POA: Diagnosis not present

## 2017-04-26 DIAGNOSIS — I7782 Antineutrophilic cytoplasmic antibody (ANCA) vasculitis: Secondary | ICD-10-CM

## 2017-04-30 NOTE — Telephone Encounter (Signed)
Let Dr Patrice Paradise know that CT chest without evidence of lung vasculitis  Please send report to Dr Patrice Paradise (eye) and Dr Dossie Der (rheum) and PCP Christain [place], MD   Please let patient know that ct lung is clean without evidence of vasculitis. However, there is  does have coronary artery calcification and if no normal cardiac stress test past few years; please refer to cardiologist - [facility] or Dr Einar Gip, first available    Thanks  Dr. Brand Males, M.D., [facility].C.P Pulmonary and Critical Care Medicine Staff Physician, [facility] Director - Interstitial Lung Disease  Program  Pulmonary [place] at [place], [place], [address]  Pager: [phone], If no answer or between  15:00h - 7:00h: call [phone] Telephone: [phone]     FINDINGS: Cardiovascular: Normal heart size. No significant pericardial fluid/thickening. Left anterior descending coronary atherosclerosis. Normal course and caliber of the thoracic aorta and pulmonary arteries.  Mediastinum/Nodes: No discrete thyroid nodules. Unremarkable esophagus. No pathologically enlarged axillary, mediastinal or gross hilar lymph nodes, noting limited sensitivity for the detection of hilar adenopathy on this noncontrast study.  Lungs/Pleura: No pneumothorax. No pleural effusion. No acute consolidative airspace disease, lung masses or significant pulmonary nodules. No significant regions of subpleural reticulation, ground-glass attenuation, parenchymal banding, traction bronchiectasis, architectural distortion or frank honeycombing. No significant air trapping on the expiration sequence. Previously described faint patchy ground-glass centrilobular nodularity in the lungs is not discretely apparent on today's scan.  Upper abdomen: No acute abnormality.  Musculoskeletal: No aggressive appearing focal osseous lesions. Mild thoracic spondylosis.  IMPRESSION: 1. No evidence  of interstitial lung disease. No active pulmonary disease, see comments. 2. One vessel coronary atherosclerosis.   Electronically Signed By: Ilona Sorrel M.D. On: 04/27/2017 17:27

## 2017-04-30 NOTE — Telephone Encounter (Signed)
Called [place] and spoke with Dr Patrice Paradise >> read to her pat HRCT results as stated by MR.  Dr Patrice Paradise also asked about patient's hoarseness.  Advised Dr Patrice Paradise that MR addressed this at last visit on 4.4.19 with a referral to ENT.  Per Care Everywhere, pt saw Dr Izora Gala on 4.16.19.  Dr Patrice Paradise was not aware of this.  Dr Patrice Paradise also stated that the vasculitis in patient's eye is "quiet" and right now she is "okay with the eye" - pt is on a "really slow taper" off the prednisone.  Called spoke with patient and discussed at length her HRCT results and recommendations as stated by MR.  Pt voiced her understanding.  Pt would like to establish with [facility] - referral placed.    CT faxed to Dr Patrice Paradise via Epic Will sign and route to MR as FYI that Dr Patrice Paradise and patient are aware of results

## 2017-05-01 ENCOUNTER — Telehealth: Payer: Self-pay | Admitting: Internal Medicine

## 2017-05-01 NOTE — Telephone Encounter (Signed)
Spoke with pt,  She is  wanting to discussget the results of her CT and asked about her heart. I explained she has some atherosclerosis. I explained the results and advised her that she has a referral to [facility] to evaluate. Pt understood and nothing further is needed.

## 2017-05-16 ENCOUNTER — Telehealth: Payer: Self-pay | Admitting: Internal Medicine

## 2017-05-16 ENCOUNTER — Ambulatory Visit: Payer: Commercial Managed Care - PPO | Admitting: Internal Medicine

## 2017-05-16 NOTE — Telephone Encounter (Signed)
Attempted to call the pt. I did not receive an answer. I have left a message for pt to return our call.  

## 2017-05-17 NOTE — Telephone Encounter (Signed)
Called and spoke to pt. Pt questioning what was seen on the CT Chest from 04/2017. I reiterated the results per MR (see phone note from 4.8.2019). Advised pt she has a cardiology appt coming up due to the CAD. Pt verbalized understanding and denied any further questions or concerns at this time.   Results from CT chest are below:  Let Dr Patrice Paradise know that CT chest without evidence of lung vasculitis  Please send report to Dr Patrice Paradise (eye) and Dr Dossie Der (rheum) and PCP Christain Sacramento, MD   Please let patient know that ct lung is clean without evidence of vasculitis. However, there is  does have coronary artery calcification and if no normal cardiac stress test past few years; please refer to cardiologist - [facility] or Dr Einar Gip, first available    Thanks  Dr. Brand Males, M.D., [facility].C.P Pulmonary and Critical Care Medicine Staff Physician, [place] Director - Interstitial Lung Disease  Program  Pulmonary [place] at [place], [place], [address]

## 2017-05-25 ENCOUNTER — Telehealth: Payer: Self-pay | Admitting: Neurology

## 2017-05-25 NOTE — Telephone Encounter (Signed)
Reviewed ophthalmology evaluation from [facility] by Dr. Jalene Mullet dated May 07, 2017, visual acuity OD 20/50, OS 20/70, sleep examination was remarkable for peripheral ulcerative keratitis OU, she has been under treatment improved markedly on oral corticosteroid therapy, revealed bilateral optic disc edema consistent with intracranial hypertension,

## 2017-05-28 ENCOUNTER — Ambulatory Visit: Payer: Commercial Managed Care - PPO | Admitting: Adult Health

## 2017-05-29 ENCOUNTER — Ambulatory Visit: Payer: Commercial Managed Care - PPO | Admitting: Adult Health

## 2017-06-07 ENCOUNTER — Ambulatory Visit: Payer: Commercial Managed Care - PPO | Admitting: Neurology

## 2017-06-26 ENCOUNTER — Ambulatory Visit: Payer: Commercial Managed Care - PPO | Admitting: Cardiology

## 2017-07-20 ENCOUNTER — Telehealth: Payer: Self-pay | Admitting: *Deleted

## 2017-07-20 NOTE — Telephone Encounter (Signed)
error 

## 2017-08-02 ENCOUNTER — Ambulatory Visit: Payer: Commercial Managed Care - PPO | Admitting: Neurology

## 2017-09-10 NOTE — Progress Notes (Deleted)
Cardiology Office Note   Date:  09/10/2017   ID:  [name], DOB [birth year], MRN [id number]  PCP:  Christain Sacramento, MD  Cardiologist:   Jenkins Rouge, MD   No chief complaint on file.     History of Present Illness: [name] is a 57 y.o. female who presents for consultation regarding dyspnea referred by Dr [name] from pulmonary. 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 Vasculitis in eye more cough and hoarseness Sees Dr [name] November ? Need for fituxan Rx  TTE done 06/24/13 with EF 55-60% mild RVE CT chest non contrast no ILD noted only mild calcification single vessel LAD      Past Medical History:  Diagnosis Date  . Acute blood loss anemia 06/23/2013  . 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 Anxiety associated with depression 09/23/2012  . At high risk for falls 09/23/2012   She has had a fractured ankle and tendon tear with falls over the last couple years.  . Chronic fatigue 05/11/2014  . Chronic right-sided low back pain with right-sided sciatica 12/26/2016  . Colitis   . Constipation, chronic 09/27/2012  . 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 Diverticulitis of large intestine with perforation 09/23/2012  . Dyspnea 05/11/2014  . GERD (gastroesophageal reflux disease)   . Hemoptysis 06/23/2013  . History of endometriosis 1997  . Hx of tobacco use, presenting hazards to health 09/23/2012  . Hyperglycemia 07/24/2013  . Hypokalemia 07/20/2013  . ILD (interstitial lung disease) ([place]) 01/22/2014  . Leukocytosis 05/11/2014  . MPA (microscopic polyangiitis) ([place]) 2015  . Numbness   . Obesity (BMI 30-39.9) 09/23/2012  . Paresthesia 12/26/2016  . Perforation  of sigmoid colon - stercoral 09/27/2012  . Physical deconditioning 04/30/2015  . 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  . Respiratory failure with hypoxia ([place]) 06/23/2013  . Right leg swelling 12/26/2016    Past Surgical History:  Procedure Laterality Date  . APPENDECTOMY    . BLADDER REPAIR    . LAPAROSCOPY  1997   dx of endometriosis  . LAPAROSCOPY  04/28/2000    Laparoscopy with lysis of adhesions, hysteroscopy, D&C.  Marland Kitchen TONSILLECTOMY       Current Outpatient Medications  Medication Sig Dispense Refill  . ALPRAZolam (XANAX) 1 MG tablet Take 1 tablet (1 mg total) by mouth 3 (three) times daily as needed for anxiety. 90 tablet 0  . azaTHIOprine (IMURAN) 50 MG tablet Take 150 mg by mouth daily.     Marland Kitchen buPROPion (WELLBUTRIN XL) 300 MG 24 hr tablet Take 450 mg by mouth daily.     . citalopram (CELEXA) 40 MG tablet Take 40 mg by mouth daily.    Marland Kitchen dextromethorphan (DELSYM) 30 MG/5ML liquid Take by mouth as needed for cough.    . esomeprazole (NEXIUM) 40 MG capsule Take 40 mg by mouth daily at 12 noon.    . Fluticasone-Salmeterol (ADVAIR) 100-50 MCG/DOSE AEPB Inhale 1 puff into the lungs 2 (two) times daily.    Marland Kitchen gabapentin (NEURONTIN) 300 MG capsule Take 1 capsule (300 mg total) by mouth 3 (three) times daily. 90 capsule 11  . HYDROcodone-homatropine (HYCODAN) 5-1.5 MG/5ML syrup Take 5 mLs by mouth every 6 (six) hours  as needed for cough. FOR DISRUPTIVE NIGHT TIME COUGH AND BODY ACHES 120 mL 0  . Multiple Vitamin (MULTIVITAMIN) tablet Take 1 tablet by mouth daily.    . Naproxen Sodium (ALEVE PO) Take by mouth as needed.    . polyethylene glycol (MIRALAX / GLYCOLAX) packet Take 17 g by mouth at bedtime.      No current facility-administered medications for this visit.     Allergies:   Mucinex [guaifenesin er] and Sulfa antibiotics    Social History:  The patient  reports that she quit smoking about 16 years ago. She has a 27.00 pack-year smoking history. She has never used smokeless  tobacco. She reports that she does not drink alcohol or use drugs.   Family History:  The patient's family history includes Birth defects in her cousin; Breast cancer in her paternal grandmother; CAD in her other; Diabetes in her father and mother; Hypertension in her father; Obesity in her father.    ROS:  Please see the history of present illness.   Otherwise, review of systems are positive for none.   All other systems are reviewed and negative.    PHYSICAL EXAM: VS:  There were no vitals taken for this visit. , BMI There is no height or weight on file to calculate BMI. Affect appropriate Chronically ill obese female  HEENT: normal Neck supple with no adenopathy JVP normal no bruits no thyromegaly Lungs clear with no wheezing and good diaphragmatic motion Heart:  S1/S2 no murmur, no rub, gallop or click PMI normal Abdomen: benighn, BS positve, no tenderness, no AAA no bruit.  No HSM or HJR Distal pulses intact with no bruits No edema Neuro non-focal Skin warm and dry No muscular weakness    EKG:  2015 SR rate 98 nonspecific ST/T Wave changes    Recent Labs: 03/31/2017: BUN 12; Creatinine, Ser 1.06; Hemoglobin 12.6; Platelets 303; Potassium 3.6; Sodium 135    Lipid Panel    Component Value Date/Time   TRIG 159 (H) 09/30/2012 0545      Wt Readings from Last 3 Encounters:  04/12/17 (!) 377 lb (171 kg)  03/31/17 (!) 381 lb (172.8 kg)  12/26/16 (!) 370 lb 8 oz (168.1 kg)      Other studies Reviewed: Additional studies/ records that were reviewed today include: Notes from rheumatology and pulmonary TTE 2015 ECG CT chest non contrast labs .    ASSESSMENT AND PLAN:  1.  Dyspne:  *** 2. Coronary Calcium: noted on CT for lung disease 04/26/17 single vessel mild LAD *** 3. 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  The patient does not have  concerns regarding medicines.  The following changes have been made:  no change  Labs/ tests ordered today include: TTE *** No orders of the defined types were placed in this encounter.    Disposition:   FU with cardiology PRN      Signed, Jenkins Rouge, MD  09/10/2017 2:14 PM    [facility] [address], [place], [place]  [address] Phone: [phone]; Fax: [phone]

## 2017-09-13 ENCOUNTER — Ambulatory Visit: Payer: Self-pay | Admitting: Cardiovascular Disease

## 2017-10-15 ENCOUNTER — Encounter: Payer: Self-pay | Admitting: Neurology

## 2017-10-15 ENCOUNTER — Encounter

## 2017-10-15 ENCOUNTER — Ambulatory Visit: Payer: Commercial Managed Care - PPO | Admitting: Neurology

## 2017-10-15 VITALS — BP 135/85 | HR 101 | Ht 68.5 in | Wt 373.0 lb

## 2017-10-15 DIAGNOSIS — G932 Benign intracranial hypertension: Secondary | ICD-10-CM | POA: Diagnosis not present

## 2017-10-15 DIAGNOSIS — M545 Low back pain: Secondary | ICD-10-CM

## 2017-10-15 DIAGNOSIS — G8929 Other chronic pain: Secondary | ICD-10-CM | POA: Diagnosis not present

## 2017-10-15 MED ORDER — RIZATRIPTAN BENZOATE 5 MG PO TBDP: 5.0000 mg | ORAL_TABLET | ORAL | 12 refills | Status: DC | PRN

## 2017-10-15 MED ORDER — BUTALBITAL-APAP-CAFFEINE 50-325-40 MG PO TABS: 1.0000 | ORAL_TABLET | Freq: Four times a day (QID) | ORAL | 5 refills | Status: DC | PRN

## 2017-10-15 MED ORDER — TOPIRAMATE 100 MG PO TABS: 100.0000 mg | ORAL_TABLET | Freq: Two times a day (BID) | ORAL | 11 refills | Status: DC

## 2017-10-15 NOTE — Progress Notes (Signed)
PATIENT: [name] DOB: [birth year]  Chief Complaint  Patient presents with  . Numbness    She is currently taking gabapentin 300mg , 2-3 capsules per day.   Marland Kitchen Optic Neuritis    She is following up from an ED visit in March 2019.   Marland Kitchen Rheumatology     HISTORICAL  YOUSRA [name] is a 57 year old female, seen in refer by her primary care doctor [name] for evaluation of numbness, initial evaluation was on December 26, 2016,  I reviewed and summarized the referring note, she had a history of depression, microscopic polyangiitis  She presented with sudden onset of breathing difficulty, cough with associated hemoptysis in June 2015, was admitted in ICU for extended period of time with diffuse alveolar hemorrhage, acute hypoxic respiratory failure secondary to microscopic polyangiitis, without kidney involvement, anemia secondary to diffuse alveolar hemorrhage, severe deconditioning, require prolonged rehabilitation.  She was treated with IV steroid, rituximab, plasmapheresis, Imuran was used as steroid sparing agent, prednisone was tapered of in 2017, repeat CT in 2017 was clear, still on Imuran 50 mg 3 tablets daily  since that event, she noticed numbness tingling in her feet 2015, gradually getting worse, she used to walk retailer store, but she could not stand for any extended period of time,  She also broke her right fifth toe in June 2018 after TV fell on her right foot, since then, she has constant swallowing of right foot, increased numbness to right ankle level,  She has chronic low back pain, radiating pain to right lower extremity, occasionally left foot paresthesia, but much less, she denies bilateral upper extremity paresthesia,  UPDATE Feb 02 2017:  Electrodiagnostic study today is normal, there is no evidence of peripheral neuropathy, no evidence of active lumbosacral radiculopathy.  We have personally reviewed MRI of lumbar spine in January 2019, small right  foraminal disc protrusion at L2-3 with impingement of right L2 nerve roots, right side disc protrusion at L4-5 with impingement of right L5 nerve roots.  Patient complains of significant right side low back pain, radiating pain from right back to right lateral thigh and, calf, right lateral 3 toes,  Doppler study of right lower extremity was negative Gabapentin 300 mg 3 times a day was helpful  UPDATE Oct 15 2017: She has lost her insurance shortly after her previous visit in December 2018, she did receive epidural injection for her low back pain by [name] in March 2019, which did help her low back pain.  In April 2019, she developed right eyes swollen, yellow discharge, difficulty opening her eye, was seen by ED, later by ophthalmologist [name], was diagnosed with peripheral ulcerative keratitis OU, she was treated by steroid eyedrop and a p.o. 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   I Reviewed ophthalmology evaluation by [name] from [name] dated May 07, 2017, visual acuity OD 20/50, OS 20/70, sleep examination was remarkable for peripheral ulcerative Keratitis OU, she has been under treatment improved markedly on oral corticosteroid therapy, revealed bilateral optic disc edema consistent with intracranial hypertension,  CT chest without contrast in April 2019 showed no evidence of interstitial lung disease, no active pulmonary disease, 1 vessel coronary arthrosclerosis, involving left anterior descending coronary artery.  I also reviewed most recent [name] eye  [name] evaluation by [name] on May 30, 2017, October 10, 2017, October 08, 2017, diagnosed with bilateral cataracts, left peripheral corneal scar, peripheral ulcerative  keratitis OU, related to her vasculitis, she was started on CellCept 500 mg twice daily, rotation, she was also treated by [name] medical [name] with rituximab, supposed to get IV infusion every 6 months   Her prednisone was tapered off since July 2019, she was recently diagnosed with diabetes, reported A1c was 7.4 in September 2019, is now taking Metformin,  She continue complains of poor vision at both eye, worsening on the left side, she is referred for continued management of bilateral papillary edema, possible intracranial hypertension,   REVIEW OF SYSTEMS: Full 14 system review of systems performed and notable only for rapid weight gain, eye discharge, itching, redness, light sensitivity, loss of vision, eye pain, cough, wheezing, shortness of breath, choking, chest tightness, leg swelling, heat intolerance, frequent awakening, incontinence of bladder, joint pain, swelling, back pain, headache, numbness, weakness, depression, anxiety  All rest of review systems were negative. ALLERGIES: Allergies  Allergen Reactions  . Mucinex [Guaifenesin Er] Nausea And Vomiting  . Sulfa Antibiotics Nausea And Vomiting    HOME MEDICATIONS: Current Outpatient Medications  Medication Sig Dispense Refill  . ALPRAZolam (XANAX) 1 MG tablet Take 1 tablet (1 mg total) by mouth 3 (three) times daily as needed for anxiety. 90 tablet 0  . buPROPion (WELLBUTRIN XL) 150 MG 24 hr tablet Take 450 mg by mouth daily.    . citalopram (CELEXA) 40 MG tablet Take 40 mg by mouth daily.    Marland Kitchen dextromethorphan (DELSYM) 30 MG/5ML liquid Take by mouth as needed for cough.    . esomeprazole (NEXIUM) 40 MG capsule Take 40 mg by mouth daily at 12 noon.    . Fluticasone-Salmeterol (ADVAIR) 100-50 MCG/DOSE AEPB Inhale 1 puff into the lungs 2 (two) times daily.    Marland Kitchen gabapentin (NEURONTIN) 300 MG capsule Take 1 capsule (300 mg total) by mouth 3 (three) times daily. 90 capsule 11  . metFORMIN (GLUCOPHAGE-XR) 500  MG 24 hr tablet Take by mouth.    . Multiple Vitamin (MULTIVITAMIN) tablet Take 1 tablet by mouth daily.    . mycophenolate (CELLCEPT) 500 MG tablet TAKE 1 TABLET BY MOUTH DAILY X1WEEK, THEN TAKE 2 TABLETS TWICE A DAY    . Naproxen Sodium (ALEVE PO) Take by mouth as needed.    . polyethylene glycol (MIRALAX / GLYCOLAX) packet Take 17 g by mouth at bedtime.      No current facility-administered medications for this visit.     PAST MEDICAL HISTORY: Past Medical History:  Diagnosis Date  . Acute blood loss anemia 06/23/2013  . 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 Anxiety associated with depression 09/23/2012  . At high risk for falls 09/23/2012   She has had a fractured ankle and tendon tear with falls over the last couple years.  . Chronic fatigue 05/11/2014  . Chronic right-sided low back pain with right-sided sciatica 12/26/2016  . Colitis   . Constipation, chronic 09/27/2012  . 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 Diverticulitis of large intestine with perforation 09/23/2012  . Dyspnea 05/11/2014  . GERD (gastroesophageal reflux disease)   . Hemoptysis 06/23/2013  . History of endometriosis 1997  . Hx of tobacco use, presenting hazards to health 09/23/2012  . Hyperglycemia 07/24/2013  . Hypokalemia 07/20/2013  . ILD (interstitial lung disease) ([place]) 01/22/2014  . Leukocytosis 05/11/2014  .  MPA (microscopic polyangiitis) ([place]) 2015  . Numbness   . Obesity (BMI 30-39.9) 09/23/2012  . Paresthesia 12/26/2016  . Perforation of sigmoid colon - stercoral 09/27/2012  . Physical deconditioning 04/30/2015  . 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  . Respiratory failure with hypoxia ([place]) 06/23/2013  . Right leg swelling 12/26/2016    PAST SURGICAL HISTORY: Past Surgical History:  Procedure Laterality Date  . APPENDECTOMY    . BLADDER REPAIR    . LAPAROSCOPY  1997   dx of endometriosis  . LAPAROSCOPY  04/28/2000    Laparoscopy with  lysis of adhesions, hysteroscopy, D&C.  Marland Kitchen TONSILLECTOMY      FAMILY HISTORY: Family History  Problem Relation Age of Onset  . Diabetes Mother        AODM  . Diabetes Father        AODM  . Hypertension Father   . Obesity Father   . Breast cancer Paternal Grandmother   . Birth defects Cousin   . CAD Other        [facility]    SOCIAL HISTORY:  Social History   Socioeconomic History  . Marital status: Married    Spouse name: Not on file  . Number of children: 0  . Years of education: 59  . Highest education level: High school graduate  Occupational History  . Occupation: Disabled  Social Needs  . Financial resource strain: Not on file  . Food insecurity:    Worry: Not on file    Inability: Not on file  . Transportation needs:    Medical: Not on file    Non-medical: Not on file  Tobacco Use  . Smoking status: Former Smoker    Packs/day: 1.00    Years: 27.00    Pack years: 27.00    Last attempt to quit: 01/09/2001    Years since quitting: 16.7  . Smokeless tobacco: Never Used  Substance and Sexual Activity  . Alcohol use: No    Frequency: Never  . Drug use: No  . Sexual activity: Not on file  Lifestyle  . Physical activity:    Days per week: Not on file    Minutes per session: Not on file  . Stress: Not on file  Relationships  . Social connections:    Talks on phone: Not on file    Gets together: Not on file    Attends religious service: Not on file    Active member of club or organization: Not on file    Attends meetings of clubs or organizations: Not on file    Relationship status: Not on file  . Intimate partner violence:    Fear of current or ex partner: Not on file    Emotionally abused: Not on file    Physically abused: Not on file    Forced sexual activity: Not on file  Other Topics Concern  . Not on file  Social History Narrative   Lives at home with her husband.   Right-handed.   1 cup caffeine per day, occasionally more.     PHYSICAL EXAM     Vitals:   10/15/17 1301  BP: 135/85  Pulse: (!) 101  Weight: (!) 373 lb (169.2 kg)  Height: 5' 8.5" (1.74 m)    Not recorded      Body mass index is 55.89 kg/m.  PHYSICAL EXAMNIATION:  Gen: NAD, conversant, well nourised, obese, well groomed  Cardiovascular: Regular rate rhythm, no peripheral edema, warm, nontender. Eyes: Conjunctivae clear without exudates or hemorrhage Neck: Supple, no carotid bruits. Pulmonary: Clear to auscultation bilaterally   NEUROLOGICAL EXAM:  MENTAL STATUS: Speech:    Speech is normal; fluent and spontaneous with normal comprehension.  Cognition:     Orientation to time, place and person     Normal recent and remote memory     Normal Attention span and concentration     Normal Language, naming, repeating,spontaneous speech     Fund of knowledge   CRANIAL NERVES: CN II: Visual fields are full to confrontation, I was not able to visualize optic head, pupils are round equal and briskly reactive to light. CN III, IV, VI: extraocular movement are normal. No ptosis. CN V: Facial sensation is intact to pinprick in all 3 divisions bilaterally. Corneal responses are intact.  CN VII: Face is symmetric with normal eye closure and smile. CN VIII: Hearing is normal to rubbing fingers CN IX, X: Palate elevates symmetrically. Phonation is normal. CN XI: Head turning and shoulder shrug are intact CN XII: Tongue is midline with normal movements and no atrophy.  MOTOR: Her right foot looks swelling, erythematous,  there was no significant bilateral lower extremity proximal distal muscle weakness, but there examination was limited due to her big body habitus.  REFLEXES: Reflexes are 2+ and symmetric at the biceps, triceps, knees, and trace at ankles. Plantar responses are flexor.  SENSORY: Length dependent decrease to light touch, pinprick and vibratory sensation at the toes  COORDINATION: Rapid alternating movements and fine finger  movements are intact. There is no dysmetria on finger-to-nose and heel-knee-shin.    GAIT/STANCE: Need to push up to get up from seated position, cautious, antalgic gait, also limited by her big body habitus  DIAGNOSTIC DATA (LABS, IMAGING, TESTING) - I reviewed patient records, labs, notes, testing and imaging myself where available.   ASSESSMENT AND PLAN  [name] is a 57 y.o. female   History of microscopic polyangiitis in June 2015 Peripheral ulcerative keratitis  Is treated with prednisone, tapered off since July 2019, normal CellCept 500 mg twice a day, also on rituximab IV infusion every 6 months,  Possible intracranial hypertension  Based on previous ophthalmology evaluation  MRI of brain  If there is no structural lesion will refer her to fluoroscopy guided lumbar puncture for opening pressure  She did have rapid weight gain, high dose of steroid treatment over the past few years,  Long history of chronic low back pain, radiating pain to right lower extremity  MRI of lumbar showed evidence of right foraminal disc protrusion at L2-3 with impingement of right L2 nerve roots, right side disc protrusion at L4-5 with impingement of right L5 nerve roots.  EMG nerve conduction study today showed no evidence of peripheral neuropathy, or active lumbosacral radiculopathy.  She is to continue gabapentin 300 mg 3 times a day, NSAIDs,  Her low back pain has much improved with epidural injection,  Patient is also under the evaluation of rheumatologist MicheleAryal, [place], for abnormal serum markers, there is no evidence of vasculitic peripheral neuropathy based on history, and electrodiagnostic study.   Marcial Pacas, M.D. Ph.D.  [name] [address] [place], [place] [address] Ph: [phone] Fax: [phone]  CC: Christain Sacramento, MD

## 2017-10-16 DIAGNOSIS — M545 Low back pain, unspecified: Secondary | ICD-10-CM | POA: Insufficient documentation

## 2017-10-16 DIAGNOSIS — G8929 Other chronic pain: Secondary | ICD-10-CM | POA: Insufficient documentation

## 2017-10-17 ENCOUNTER — Telehealth: Payer: Self-pay | Admitting: Neurology

## 2017-10-17 NOTE — Telephone Encounter (Signed)
lvm for pt to call back about schedule mri  [facility] Auth: [id number] (exp. 10/17/17 to 11/15/17)

## 2017-10-22 ENCOUNTER — Telehealth: Payer: Self-pay | Admitting: Neurology

## 2017-10-22 NOTE — Telephone Encounter (Signed)
Spoke to the patient she is scheduled at 10/24/17 at [facility].

## 2017-10-22 NOTE — Telephone Encounter (Signed)
Patient returning call regarding MRI.

## 2017-10-22 NOTE — Telephone Encounter (Signed)
Spoke to the patient she is scheduled at 10/24/17 at [facility].

## 2017-10-24 ENCOUNTER — Ambulatory Visit: Payer: Commercial Managed Care - PPO

## 2017-10-24 DIAGNOSIS — G932 Benign intracranial hypertension: Secondary | ICD-10-CM

## 2017-10-25 ENCOUNTER — Ambulatory Visit: Payer: Commercial Managed Care - PPO | Admitting: Cardiovascular Disease

## 2017-10-25 ENCOUNTER — Encounter

## 2017-10-25 ENCOUNTER — Encounter: Payer: Self-pay | Admitting: Cardiovascular Disease

## 2017-10-25 VITALS — BP 152/88 | HR 97 | Ht 68.5 in | Wt 379.0 lb

## 2017-10-25 DIAGNOSIS — R0602 Shortness of breath: Secondary | ICD-10-CM

## 2017-10-25 NOTE — Patient Instructions (Addendum)
Medication Instructions:  Your physician recommends that you continue on your current medications as directed. Please refer to the Current Medication list given to you today.  Labwork: NONE  Testing/Procedures: Your physician has requested that you have an echocardiogram. Echocardiography is a painless test that uses sound waves to create images of your heart. It provides your doctor with information about the size and shape of your heart and how well your heart's chambers and valves are working. This procedure takes approximately one hour. There are no restrictions for this procedure.  Follow-Up: Your physician wants you to follow-up as needed with Dr. Nishan.    If you need a refill on your cardiac medications before your next appointment, please call your pharmacy.    

## 2017-10-25 NOTE — Progress Notes (Signed)
Cardiology Office Note   Date:  10/25/2017   ID:  [name], DOB 1960-10-24, MRN [id number]  PCP:  Christain Sacramento, MD  Cardiologist:   Jenkins Rouge, MD   No chief complaint on file.     History of Present Illness: [name] is a 57 y.o. female who presents for consultation regarding dyspnea.  Referred by Dr Redmond Pulling. She has depression, polyangitis  History of dyspnea and hemoptysis in June 2015 with diffuse alveolar hemorrhage She had to be Rx with rituximab, plasmapheresis , and imuran as well as long prednisone taper Chronic low back pain limits activity has had steroid injections and epidural More recent issues with ulcerative keratitis  F/U CT with healing of lungs 04/2017 and minor calcification of LAD  Had TTE done 06/24/13 with ASH normal EF no significant valve disease or pulmonary HTN  She is a difficult historian and is clearly bipolar with pressured speech. No real cardiac complaints other than dyspnea       Past Medical History:  Diagnosis Date  . Acute blood loss anemia 06/23/2013  . 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 Anxiety associated with depression 09/23/2012  . At high risk for falls 09/23/2012   She has had a fractured ankle and tendon tear with falls over the last couple years.  . Chronic fatigue 05/11/2014  . Chronic right-sided low back pain with right-sided sciatica 12/26/2016  . Colitis   . Constipation, chronic 09/27/2012  . 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 Diverticulitis of large intestine with perforation 09/23/2012  . Dyspnea 05/11/2014  . GERD (gastroesophageal reflux disease)   . Hemoptysis 06/23/2013  . History of endometriosis 1997  . Hx of tobacco use, presenting hazards to health 09/23/2012  . Hyperglycemia 07/24/2013  . Hypokalemia 07/20/2013  . ILD (interstitial lung disease) ([place]) 01/22/2014  . Leukocytosis 05/11/2014  . MPA (microscopic polyangiitis) ([place]) 2015  .  Numbness   . Obesity (BMI 30-39.9) 09/23/2012  . Paresthesia 12/26/2016  . Perforation of sigmoid colon - stercoral 09/27/2012  . Physical deconditioning 04/30/2015  . 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  . Respiratory failure with hypoxia ([place]) 06/23/2013  . Right leg swelling 12/26/2016    Past Surgical History:  Procedure Laterality Date  . APPENDECTOMY    . BLADDER REPAIR    . LAPAROSCOPY  1997   dx of endometriosis  . LAPAROSCOPY  04/28/2000    Laparoscopy with lysis of adhesions, hysteroscopy, D&C.  Marland Kitchen TONSILLECTOMY       Current Outpatient Medications  Medication Sig Dispense Refill  . ALPRAZolam (XANAX) 1 MG tablet Take 1 tablet (1 mg total) by mouth 3 (three) times daily as needed for anxiety. 90 tablet 0  . buPROPion (WELLBUTRIN XL) 150 MG 24 hr tablet Take 450 mg by mouth daily.    . butalbital-acetaminophen-caffeine (FIORICET, ESGIC) 50-325-40 MG tablet Take 1 tablet by mouth every 6 (six) hours as needed for headache. Cancel previous order for Maxalt  Refill Rx in less than 30 days 12 tablet 5  . citalopram (CELEXA) 40 MG tablet Take 40 mg by mouth daily.    Marland Kitchen dextromethorphan (DELSYM) 30 MG/5ML liquid Take by mouth as needed for cough.    . esomeprazole (NEXIUM) 40 MG capsule Take 40 mg by mouth daily at 12 noon.    . Fluticasone-Salmeterol (ADVAIR) 100-50 MCG/DOSE AEPB Inhale 1 puff into the lungs 2 (two) times daily.    Marland Kitchen gabapentin (NEURONTIN) 300  MG capsule Take 1 capsule (300 mg total) by mouth 3 (three) times daily. 90 capsule 11  . metFORMIN (GLUCOPHAGE-XR) 500 MG 24 hr tablet Take by mouth.    . Multiple Vitamin (MULTIVITAMIN) tablet Take 1 tablet by mouth daily.    . mycophenolate (CELLCEPT) 500 MG tablet TAKE 1 TABLET BY MOUTH DAILY X1WEEK, THEN TAKE 2 TABLETS TWICE A DAY    . Naproxen Sodium (ALEVE PO) Take by mouth as needed.    . polyethylene glycol (MIRALAX / GLYCOLAX) packet Take 17 g by mouth at bedtime.     . topiramate (TOPAMAX) 100 MG tablet Take  1 tablet (100 mg total) by mouth 2 (two) times daily. 60 tablet 11   No current facility-administered medications for this visit.     Allergies:   Mucinex [guaifenesin er] and Sulfa antibiotics    Social History:  The patient  reports that she quit smoking about 16 years ago. She has a 27.00 pack-year smoking history. She has never used smokeless tobacco. She reports that she does not drink alcohol or use drugs.   Family History:  The patient's family history includes Birth defects in her cousin; Breast cancer in her paternal grandmother; CAD in her other; Diabetes in her father and mother; Hypertension in her father; Obesity in her father.    ROS:  Please see the history of present illness.   Otherwise, review of systems are positive for none.   All other systems are reviewed and negative.    PHYSICAL EXAM: VS:  BP (!) 152/88   Pulse 97   Ht 5' 8.5" (1.74 m)   Wt (!) 379 lb (171.9 kg)   SpO2 97%   BMI 56.79 kg/m  , BMI Body mass index is 56.79 kg/m. Affect appropriate Obese cushingoid white female  HEENT: normal Neck supple with no adenopathy JVP normal no bruits no thyromegaly Lungs clear with no wheezing and good diaphragmatic motion Heart:  S1/S2 no murmur, no rub, gallop or click PMI normal Abdomen: benighn, BS positve, no tenderness, no AAA no bruit.  No HSM or HJR Distal pulses intact with no bruits Plus one bilateral edema Neuro non-focal Skin warm and dry No muscular weakness    EKG:  SR rate 97 nonspecific ST changes    Recent Labs: 03/31/2017: BUN 12; Creatinine, Ser 1.06; Hemoglobin 12.6; Platelets 303; Potassium 3.6; Sodium 135    Lipid Panel    Component Value Date/Time   TRIG 159 (H) 09/30/2012 0545      Wt Readings from Last 3 Encounters:  10/25/17 (!) 379 lb (171.9 kg)  10/15/17 (!) 373 lb (169.2 kg)  04/12/17 (!) 377 lb (171 kg)      Other studies Reviewed: Additional studies/ records that were reviewed today include: Notes from  pulmonary and neurology hospitalization at [facility] CT scan TTE 2015 labs .    ASSESSMENT AND PLAN:  1.  CAD:  Minor coronary calcium seen on chest CT no chest pain no indication for stress testing 2. Dyspne: related to obesity weight gain and previous lung injury. F/u TTE to assess RV and LV function  3. Rheumatology:  Continue Celcept may need pulse steroids serial ESR 4. DM:  Discussed low carb diet.  Target hemoglobin A1c is 6.5 or less.  Continue current medications. 5. Neuro:  MRI pending patient appears to be bipolar and should be considered for manic like meds Abilify and not just antidepressants    Current medicines are reviewed at length with the patient  today.  The patient does not have concerns regarding medicines.  The following changes have been made:  no change  Labs/ tests ordered today include: TTE   Orders Placed This Encounter  Procedures  . EKG 12-Lead  . ECHOCARDIOGRAM COMPLETE     Disposition:   FU with cardiology PRN      Signed, Jenkins Rouge, MD  10/25/2017 3:21 PM    [facility] [address], [place], [place]  [address] Phone: [phone]; Fax: [phone]

## 2017-10-26 ENCOUNTER — Telehealth: Payer: Self-pay | Admitting: *Deleted

## 2017-10-26 NOTE — Telephone Encounter (Signed)
-----   Message from Marcial Pacas, MD sent at 10/26/2017 11:36 AM EDT ----- Please call patient for normal MRI of brain

## 2017-10-26 NOTE — Telephone Encounter (Signed)
Spoke to patient - she is aware of her normal results.

## 2017-11-05 ENCOUNTER — Ambulatory Visit (HOSPITAL_COMMUNITY): Payer: Commercial Managed Care - PPO

## 2017-11-06 ENCOUNTER — Ambulatory Visit (HOSPITAL_COMMUNITY): Payer: Commercial Managed Care - PPO | Attending: Cardiovascular Disease

## 2017-11-06 ENCOUNTER — Other Ambulatory Visit: Payer: Self-pay

## 2017-11-06 DIAGNOSIS — R0602 Shortness of breath: Secondary | ICD-10-CM

## 2017-11-06 MED ORDER — PERFLUTREN LIPID MICROSPHERE
1.0000 mL | INTRAVENOUS | Status: AC | PRN
  Administered 2017-11-06: 3 mL via INTRAVENOUS

## 2017-11-07 ENCOUNTER — Telehealth: Payer: Self-pay

## 2017-11-07 DIAGNOSIS — Z79899 Other long term (current) drug therapy: Secondary | ICD-10-CM

## 2017-11-07 DIAGNOSIS — R9430 Abnormal result of cardiovascular function study, unspecified: Secondary | ICD-10-CM

## 2017-11-07 DIAGNOSIS — R0602 Shortness of breath: Secondary | ICD-10-CM

## 2017-11-07 MED ORDER — CARVEDILOL 3.125 MG PO TABS: 3.1250 mg | ORAL_TABLET | Freq: Two times a day (BID) | ORAL | 3 refills | Status: DC

## 2017-11-07 MED ORDER — SACUBITRIL-VALSARTAN 24-26 MG PO TABS: 1.0000 | ORAL_TABLET | Freq: Two times a day (BID) | ORAL | 11 refills | Status: DC

## 2017-11-07 NOTE — Telephone Encounter (Signed)
Called patient back. Informed patient of echo results. Per Dr. Johnsie Cancel, EF is down start coreg 3.125 bid and entresto f/u with Pharm D to titrate Needs lexiscan myovue to r/o CAD F/U with PA and me in 3 months. Sent medication to pharmacy of choice. Patient will follow up with pharm D on 11/26/17. Patient has follow up appointment with Dr. Johnsie Cancel in 3 months. Ordered [employer], will send message to [facility] to call and schedule. Will go over instructions once myoview is scheduled.

## 2017-11-07 NOTE — Telephone Encounter (Signed)
-----   Message from Josue Hector, MD sent at 11/07/2017  8:30 AM EDT ----- EF is down start coreg 3.125 bid and entresto f/u with Pharm D to titrate Needs lexiscan myovue to r/o CAD F/U with PA and me in 3 months

## 2017-11-07 NOTE — Telephone Encounter (Signed)
Left message for patient to call back  

## 2017-11-07 NOTE — Telephone Encounter (Signed)
Follow Up:      Returning your call from today. 

## 2017-11-08 NOTE — Telephone Encounter (Signed)
Left message for patient to call back  

## 2017-11-08 NOTE — Telephone Encounter (Signed)
Follow Up:    Patient returning call from yesterday about some results.

## 2017-11-09 NOTE — Telephone Encounter (Signed)
Patient called back and had a few questions for Dr. Johnsie Cancel.  1- What has caused her EF to be low? Does it have anything to do with her autoimmune disease?   2- Dr. Kathlene November her Rheumatologist [phone]) has patient doing infusions of Rituxan. Patient stated this is to help her autoimmune disease and her next dose  is 11/19/17. Patient wants to know if this medication is going to affect her heart or make it worse? And should she stop having the Rituxan infusions?  Will forward to Dr. Johnsie Cancel for advisement.

## 2017-11-09 NOTE — Telephone Encounter (Signed)
Called patient back. Informed her of Dr. Kyla Balzarine recommendations. Will put in referral for HF clinic. Patient is going to call her rheumatologist and cancel her appt to have the Rituxan. Informed patient that Dr. Johnsie Cancel has the number to her rheumatologist and we will be in contact with him.

## 2017-11-09 NOTE — Telephone Encounter (Signed)
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

## 2017-11-12 ENCOUNTER — Telehealth: Payer: Self-pay | Admitting: Cardiovascular Disease

## 2017-11-12 NOTE — Telephone Encounter (Signed)
New Message        Patient called today to let us know that her sister Ellwood Dense will be picking up Rx cards/package. Patient would like information about her diagnosis put in the package as well.

## 2017-11-12 NOTE — Telephone Encounter (Signed)
Will place information in package.

## 2017-11-13 MED ORDER — SACUBITRIL-VALSARTAN 24-26 MG PO TABS: 1.0000 | ORAL_TABLET | Freq: Two times a day (BID) | ORAL | 11 refills | Status: DC

## 2017-11-13 NOTE — Addendum Note (Signed)
Addended by: Derl Barrow on: 11/13/2017 01:09 PM   Modules accepted: Orders

## 2017-11-22 ENCOUNTER — Telehealth (HOSPITAL_COMMUNITY): Payer: Self-pay | Admitting: Cardiology

## 2017-11-22 NOTE — Telephone Encounter (Signed)
NP referral from DR [facility] for cardio/onc NICM, will need [facility]  appt made 01/14/18 @ 1140  Left message for patient

## 2017-11-23 NOTE — Telephone Encounter (Signed)
Patient aware of appt date/time NP packet mailed

## 2017-11-25 NOTE — Progress Notes (Signed)
Patient ID: [name]                 DOB: 01/25/1960                      MRN: [id number]     HPI: [name] is a 57 y.o. female referred by Dr. Johnsie Cancel to pharmacy clinic for heart failure medication optimization. PMH is significant for anxiety, fatigue, constipation, dyspnea, GERD, and CAD. She recently obtained an echo which revealed newly reduced EF 35-40%, normal EF on echo in 2015. She was started on carvedilol 3.125 mg BID and entresto 24-26 mg BID. Concerns for rituximab-induced cardiomyopathy. She was referred to pharmacy clinic for medication optimization prior to appointment with heart failure specialist and possible right/left heart cath.   She reports to clinic for follow up. She reports since before starting these medications, she would fall over from dizziness from pushing herself too far activity wise. In the past two weeks, she reports she's fallen 4 times from standing up too quickly and getting dizzy. She has started to try to change position more slowly, she reports less dizziness upon standing. Patient reports that when she misses doses, she reports she can't breathe as well as when she does take it. She reports that she can push herself further than what she did before starting the medications. She reports her average HR before taking carvedilol was 100-110, now with carvedilol her HR 70 bpm. She reports that she cannot lay flat without getting short of breath/coughing, she can now bend over without getting dizzy. She reports compliance with carvedilol and Entresto. She reports taking Aleve whenever she falls and for sciatic nerve pain. She does not check her blood pressure at home and does not have a meter.   Current HF meds: carvedilol 3.125 mg BID, entresto 24-26 mg BID BP goal: <130/80 mmHg  Family History: The patient's family history includes Birth defects in her cousin; Breast cancer in her paternal grandmother; CAD inv  her other; Diabetes in her father and  mother; Hypertension in her father; Obesity in her father.  Social History: (-) tobacco; (-) alcohol  Diet: She tries to avoid eating when she doesn't feel hungry. She has stopped snacking at night. She admits to stress eating, but she is trying to avoid it. She has tried to eat out less and avoid sweets and chocolate.  Exercise: She has joined [employer], but hasn't gone yet. She reports getting short of breath with minimal activity and that she tries to push herself until she gets too short of breath and sits down.   Home BP readings: Does not check blood pressure at home. Does not have blood pressure reader.  Wt Readings from Last 3 Encounters:  10/25/17 (!) 379 lb (171.9 kg)  10/15/17 (!) 373 lb (169.2 kg)  04/12/17 (!) 377 lb (171 kg)   BP Readings from Last 3 Encounters:  10/25/17 (!) 152/88  10/15/17 135/85  04/12/17 122/82   Pulse Readings from Last 3 Encounters:  10/25/17 97  10/15/17 (!) 101  04/12/17 94    Renal function: CrCl cannot be calculated (Patient's most recent lab result is older than the maximum 21 days allowed.).  Past Medical History:  Diagnosis Date  . Acute blood loss anemia 06/23/2013  . 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 Anxiety associated with depression 09/23/2012  . At high risk for falls 09/23/2012   She has had  a fractured ankle and tendon tear with falls over the last couple years.  . Chronic fatigue 05/11/2014  . Chronic right-sided low back pain with right-sided sciatica 12/26/2016  . Colitis   . Constipation, chronic 09/27/2012  . 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 Diverticulitis of large intestine with perforation 09/23/2012  . Dyspnea 05/11/2014  . GERD (gastroesophageal reflux disease)   . Hemoptysis 06/23/2013  . History of endometriosis 1997  . Hx of tobacco use, presenting hazards to health 09/23/2012  . Hyperglycemia 07/24/2013  . Hypokalemia 07/20/2013  . ILD  (interstitial lung disease) ([place]) 01/22/2014  . Leukocytosis 05/11/2014  . MPA (microscopic polyangiitis) ([place]) 2015  . Numbness   . Obesity (BMI 30-39.9) 09/23/2012  . Paresthesia 12/26/2016  . Perforation of sigmoid colon - stercoral 09/27/2012  . Physical deconditioning 04/30/2015  . 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  . Respiratory failure with hypoxia ([place]) 06/23/2013  . Right leg swelling 12/26/2016    Current Outpatient Medications on File Prior to Visit  Medication Sig Dispense Refill  . ALPRAZolam (XANAX) 1 MG tablet Take 1 tablet (1 mg total) by mouth 3 (three) times daily as needed for anxiety. 90 tablet 0  . buPROPion (WELLBUTRIN XL) 150 MG 24 hr tablet Take 450 mg by mouth daily.    . butalbital-acetaminophen-caffeine (FIORICET, ESGIC) 50-325-40 MG tablet Take 1 tablet by mouth every 6 (six) hours as needed for headache. Cancel previous order for Maxalt  Refill Rx in less than 30 days 12 tablet 5  . carvedilol (COREG) 3.125 MG tablet Take 1 tablet (3.125 mg total) by mouth 2 (two) times daily. 180 tablet 3  . citalopram (CELEXA) 40 MG tablet Take 40 mg by mouth daily.    Marland Kitchen dextromethorphan (DELSYM) 30 MG/5ML liquid Take by mouth as needed for cough.    . esomeprazole (NEXIUM) 40 MG capsule Take 40 mg by mouth daily at 12 noon.    . Fluticasone-Salmeterol (ADVAIR) 100-50 MCG/DOSE AEPB Inhale 1 puff into the lungs 2 (two) times daily.    Marland Kitchen gabapentin (NEURONTIN) 300 MG capsule Take 1 capsule (300 mg total) by mouth 3 (three) times daily. 90 capsule 11  . metFORMIN (GLUCOPHAGE-XR) 500 MG 24 hr tablet Take by mouth.    . Multiple Vitamin (MULTIVITAMIN) tablet Take 1 tablet by mouth daily.    . mycophenolate (CELLCEPT) 500 MG tablet TAKE 1 TABLET BY MOUTH DAILY X1WEEK, THEN TAKE 2 TABLETS TWICE A DAY    . Naproxen Sodium (ALEVE PO) Take by mouth as needed.    . polyethylene glycol (MIRALAX / GLYCOLAX) packet Take 17 g by mouth at bedtime.     . sacubitril-valsartan (ENTRESTO)  24-26 MG Take 1 tablet by mouth 2 (two) times daily. 60 tablet 11  . topiramate (TOPAMAX) 100 MG tablet Take 1 tablet (100 mg total) by mouth 2 (two) times daily. 60 tablet 11   No current facility-administered medications on file prior to visit.     Allergies  Allergen Reactions  . Mucinex [Guaifenesin Er] Nausea And Vomiting  . Sulfa Antibiotics Nausea And Vomiting     Assessment/Plan:  1. Heart Failure - BP is near goal <130/80, much decreased from prior to starting carvedilol and Entresto. She reports compliance with carvedilol 3.125 mg twice daily and Entresto 24-26 mg twice daily. She reports getting short of breath with minimal activity, excess weight and deconditioning likely contributing to dyspnea. No lower extremity edema present but reporting orthopnea.  Will increase carvedilol to 6.25 mg twice  daily. Will check BMET today to see if patient is able to start spironolactone. Hopefully spironolactone and possible Entresto titration will help alleviate dyspnea. Follow up in pharmacy clinic in 3 weeks.  Claiborne Billings, PharmD PGY2 Cardiology Pharmacy Resident 11/26/2017 4:36 PM

## 2017-11-26 ENCOUNTER — Ambulatory Visit (INDEPENDENT_AMBULATORY_CARE_PROVIDER_SITE_OTHER): Payer: Commercial Managed Care - PPO | Admitting: Pharmacist

## 2017-11-26 DIAGNOSIS — Z79899 Other long term (current) drug therapy: Secondary | ICD-10-CM

## 2017-11-26 DIAGNOSIS — R0602 Shortness of breath: Secondary | ICD-10-CM | POA: Diagnosis not present

## 2017-11-26 DIAGNOSIS — R9430 Abnormal result of cardiovascular function study, unspecified: Secondary | ICD-10-CM | POA: Diagnosis not present

## 2017-11-26 MED ORDER — CARVEDILOL 6.25 MG PO TABS: 6.2500 mg | ORAL_TABLET | Freq: Two times a day (BID) | ORAL | 11 refills | Status: DC

## 2017-11-26 NOTE — Patient Instructions (Addendum)
It was nice to meet you today.   Continue taking Entresto 24-26 mg twice daily, about 12 hours apart.   Increase your carvedilol 6.25 mg twice daily, about 12 hours apart.   We will check your labs today. If everything looks okay, we will start a new medication, spironolactone, half of a tablet every day.   Please start taking tylenol (acetaminophen) instead of Aleve (ibuprofen).   Please get blood pressure monitoring, and start checking your blood pressure at least 2 hours after you take your carvedilol and Entresto.

## 2017-11-27 LAB — BASIC METABOLIC PANEL
BUN/Creatinine Ratio: 19 (ref 9–23)
BUN: 16 mg/dL (ref 6–24)
CALCIUM: 9.9 mg/dL (ref 8.7–10.2)
CHLORIDE: 98 mmol/L (ref 96–106)
CO2: 21 mmol/L (ref 20–29)
Creatinine, Ser: 0.85 mg/dL (ref 0.57–1.00)
GFR calc Af Amer: 89 mL/min/{1.73_m2} (ref >59)
GFR calc non Af Amer: 77 mL/min/{1.73_m2} (ref >59)
GLUCOSE: 127 mg/dL — AB (ref 65–99)
Potassium: 4.9 mmol/L (ref 3.5–5.2)
Sodium: 138 mmol/L (ref 134–144)

## 2017-11-28 ENCOUNTER — Encounter: Payer: Self-pay | Admitting: Neurology

## 2017-11-28 ENCOUNTER — Ambulatory Visit: Payer: Commercial Managed Care - PPO | Admitting: Neurology

## 2017-11-28 ENCOUNTER — Telehealth: Payer: Self-pay | Admitting: Pharmacist

## 2017-11-28 ENCOUNTER — Encounter

## 2017-11-28 VITALS — BP 154/88 | HR 88 | Ht 68.5 in | Wt 372.5 lb

## 2017-11-28 DIAGNOSIS — R51 Headache: Secondary | ICD-10-CM | POA: Diagnosis not present

## 2017-11-28 DIAGNOSIS — R519 Headache, unspecified: Secondary | ICD-10-CM

## 2017-11-28 DIAGNOSIS — M5441 Lumbago with sciatica, right side: Secondary | ICD-10-CM | POA: Diagnosis not present

## 2017-11-28 DIAGNOSIS — G8929 Other chronic pain: Secondary | ICD-10-CM | POA: Diagnosis not present

## 2017-11-28 MED ORDER — MELOXICAM 15 MG PO TABS: 15.0000 mg | ORAL_TABLET | Freq: Every day | ORAL | 6 refills | Status: DC

## 2017-11-28 MED ORDER — SUMATRIPTAN SUCCINATE 25 MG PO TABS: 25.0000 mg | ORAL_TABLET | ORAL | 6 refills | Status: DC | PRN

## 2017-11-28 MED ORDER — FUROSEMIDE 20 MG PO TABS: 20.0000 mg | ORAL_TABLET | Freq: Every day | ORAL | 3 refills | Status: DC

## 2017-11-28 NOTE — Telephone Encounter (Signed)
Patient requesting Dr. Johnsie Cancel to call Dr. Kathlene November about Retuxin- [place] medicine.   Dr. Lahoma Rocker phone: [phone]

## 2017-11-28 NOTE — Telephone Encounter (Signed)
Pt returned call about BMET - advised of results. Discussed plan to defer initiation of spironolactone and start lasix for fluid. Will send RX for lasix 20mg  daily - advised pt to take in AM. She has taking furosemide in the past she believes.   She will call with any concerns and return for follow up on 12/24/17 for BP check and BMET.

## 2017-11-28 NOTE — Progress Notes (Signed)
PATIENT: [name] DOB: [birth year]  Chief Complaint  Patient presents with  . Pseudotumor Cerebri    She would like to review her brain MRI.  She stopped taking topiramate three weeks ago.  Fioricet was not helpful for her pain.  Marland Kitchen Heart Issues    She has just been diagnosed with cardiomyopathy.     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  She was treated with IV steroid, rituximab, plasmapheresis, Imuran was used as steroid sparing agent, prednisone was tapered of in 2017, repeat CT in 2017 was clear, still on Imuran 50 mg 3 tablets daily  since that event, she noticed numbness tingling in her feet 2015, gradually getting worse, she used to walk retailer store, but she could not stand for any extended period of time,  She also broke her right fifth toe in June 2018 after TV fell on her right foot, since then, she has constant swallowing of right foot, increased numbness to right ankle level,  She has chronic low back pain, radiating pain to right lower extremity, occasionally left foot paresthesia, but much less, she denies bilateral upper extremity paresthesia,  UPDATE Feb 02 2017: Electrodiagnostic study today is normal, there is no evidence of peripheral neuropathy, no evidence of active lumbosacral radiculopathy.  We have personally reviewed  MRI of lumbar spine in January 2019, small right foraminal disc protrusion at L2-3 with impingement of right L2 nerve roots, right side disc protrusion at L4-5 with impingement of right L5 nerve roots.  Patient complains of significant right side low back pain, radiating pain from right back to right lateral thigh and, calf, right lateral 3 toes,  Doppler study of right lower extremity was negative Gabapentin 300 mg 3 times a day was helpful  UPDATE Oct 15 2017: She has lost her insurance shortly after her previous visit in December 2018, she did receive epidural injection for her low back pain by Dr. Jacelyn Grip in March 2019, which did help her low back pain.  In April 2019, she developed right eyes swollen, yellow discharge, difficulty opening her eye, was seen by ED, later by ophthalmologist Dr. Posey Pronto, was diagnosed with peripheral ulcerative keratitis OU, she was treated by steroid eyedrop and a p.o. 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   I Reviewed ophthalmology evaluation by Dr. Eric Form from [place] dated May 07, 2017, visual acuity OD 20/50, OS 20/70, sleep examination was remarkable for peripheral ulcerative Keratitis OU, she has been under treatment improved markedly on oral corticosteroid therapy, revealed bilateral optic disc edema consistent with intracranial hypertension,  CT chest without contrast in April 2019 showed no evidence of interstitial lung disease, no active pulmonary disease, 1 vessel coronary arthrosclerosis, involving left anterior descending coronary artery.  I also  reviewed most recent [facility] evaluation by Dr. Patrice Paradise on May 30, 2017, October 10, 2017, October 08, 2017, diagnosed with bilateral cataracts,  left peripheral corneal scar, peripheral ulcerative keratitis OU, related to her vasculitis, she was started on CellCept 500 mg twice daily, rotation, she was also treated [facility] with rituximab, supposed to get IV infusion every 6 months   Her prednisone was tapered off since July 2019, she was recently diagnosed with diabetes, reported A1c was 7.4 in September 2019, is now taking Metformin,  She continue complains of poor vision at both eye, worsening on the left side, she is referred for continued management of bilateral papillary edema, possible intracranial hypertension,  UPDATE Nov 28 2017: ECHO on Nov 06 2017: EF 35 to 40%, mild to moderate LVH.  She was taking Rituixmab for autoimmune disease, along with CellCept, symptoms are under good control, however her cardiologist Dr.Nishan raised the possibility that rituximab could cause potential nonischemic cardiomyopathy, she has to cancel her planned infusion in November 2019, she was started on Coreg,Scubitril-valsartan, complains of dizziness.  She also stopped Topamax, now complains of almost constant right parietal region headaches, eyestrain, light noise sensitivity, nauseous,  We personally reviewed MRI brain without contrast on October 24, 2017 that was normal.   REVIEW OF SYSTEMS: Full 14 system review of systems performed and notable only for activity change, fatigue, excessive sweating, eye discharge, itching, redness, light sensitivity, loss of vision, eye pain, blurred vision, cough, wheezing, shortness of breath, choking, chest tightness, chest pain, leg swelling, insomnia, frequent awakening, incontinence of bladder, joint pain, swelling, back pain, aching muscles, muscle cramps, dizziness, headaches, numbness, weakness, depression, anxiety All rest of review systems were negative. ALLERGIES: Allergies  Allergen Reactions  . Mucinex [Guaifenesin Er] Nausea And Vomiting  . Sulfa Antibiotics  Nausea And Vomiting    HOME MEDICATIONS: Current Outpatient Medications  Medication Sig Dispense Refill  . Acetaminophen (TYLENOL PO) Take by mouth as needed.    . ALPRAZolam (XANAX) 1 MG tablet Take 1 tablet (1 mg total) by mouth 3 (three) times daily as needed for anxiety. 90 tablet 0  . buPROPion (WELLBUTRIN XL) 150 MG 24 hr tablet Take 450 mg by mouth daily.    . carvedilol (COREG) 6.25 MG tablet Take 1 tablet (6.25 mg total) by mouth 2 (two) times daily. 60 tablet 11  . citalopram (CELEXA) 40 MG tablet Take 40 mg by mouth daily.    Marland Kitchen dextromethorphan (DELSYM) 30 MG/5ML liquid Take by mouth as needed for cough.    . esomeprazole (NEXIUM) 40 MG capsule Take 40 mg by mouth daily at 12 noon.    . Fluticasone-Salmeterol (ADVAIR) 100-50 MCG/DOSE AEPB Inhale 1 puff into the lungs 2 (two) times daily.    . furosemide (LASIX) 20 MG tablet Take 1 tablet (20 mg total) by mouth daily. 90 tablet 3  . gabapentin (NEURONTIN) 300 MG capsule Take 1 capsule (300 mg total) by mouth 3 (three) times daily. 90 capsule 11  . metFORMIN (GLUCOPHAGE-XR) 500 MG 24 hr tablet Take 500 mg by mouth 2 (two) times daily.     . Multiple Vitamin (MULTIVITAMIN) tablet Take 1 tablet by mouth daily.    . mycophenolate (CELLCEPT) 500 MG tablet Take 1,000 mg by mouth 2 (two) times daily.     . polyethylene glycol (MIRALAX / GLYCOLAX) packet Take 17 g by mouth at bedtime.     . sacubitril-valsartan (ENTRESTO) 24-26 MG Take 1 tablet by mouth  2 (two) times daily. 60 tablet 11  . topiramate (TOPAMAX) 100 MG tablet Take 1 tablet (100 mg total) by mouth 2 (two) times daily. 60 tablet 11   No current facility-administered medications for this visit.     PAST MEDICAL HISTORY: Past Medical History:  Diagnosis Date  . Acute blood loss anemia 06/23/2013  . 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 Anxiety associated with depression 09/23/2012  . At high risk for falls 09/23/2012   She has had  a fractured ankle and tendon tear with falls over the last couple years.  . Cardiomyopathy ([place])   . Chronic fatigue 05/11/2014  . Chronic right-sided low back pain with right-sided sciatica 12/26/2016  . Colitis   . Constipation, chronic 09/27/2012  . 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 Diverticulitis of large intestine with perforation 09/23/2012  . Dyspnea 05/11/2014  . GERD (gastroesophageal reflux disease)   . Hemoptysis 06/23/2013  . History of endometriosis 1997  . Hx of tobacco use, presenting hazards to health 09/23/2012  . Hyperglycemia 07/24/2013  . Hypokalemia 07/20/2013  . ILD (interstitial lung disease) ([place]) 01/22/2014  . Leukocytosis 05/11/2014  . MPA (microscopic polyangiitis) ([place]) 2015  . Numbness   . Obesity (BMI 30-39.9) 09/23/2012  . Paresthesia 12/26/2016  . Perforation of sigmoid colon - stercoral 09/27/2012  . Physical deconditioning 04/30/2015  . 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  . Respiratory failure with hypoxia ([place]) 06/23/2013  . Right leg swelling 12/26/2016    PAST SURGICAL HISTORY: Past Surgical History:  Procedure Laterality Date  . APPENDECTOMY    . BLADDER REPAIR    . LAPAROSCOPY  1997   dx of endometriosis  . LAPAROSCOPY  04/28/2000    Laparoscopy with lysis of adhesions, hysteroscopy, D&C.  Marland Kitchen TONSILLECTOMY      FAMILY HISTORY: Family History  Problem Relation Age of Onset  . Diabetes Mother        AODM  . Diabetes Father        AODM  . Hypertension Father   . Obesity Father   . Breast cancer Paternal Grandmother   . Birth defects Cousin   . CAD Other        [facility]    SOCIAL HISTORY:  Social History   Socioeconomic History  . Marital status: Married    Spouse name: Not on file  . Number of children: 0  . Years of education: 62  . Highest education level: High school graduate  Occupational History  . Occupation: Disabled  Social Needs  . Financial resource strain: Not on file  . Food insecurity:    Worry: Not  on file    Inability: Not on file  . Transportation needs:    Medical: Not on file    Non-medical: Not on file  Tobacco Use  . Smoking status: Former Smoker    Packs/day: 1.00    Years: 27.00    Pack years: 27.00    Last attempt to quit: 01/09/2001    Years since quitting: 16.8  . Smokeless tobacco: Never Used  Substance and Sexual Activity  . Alcohol use: No    Frequency: Never  . Drug use: No  . Sexual activity: Not on file  Lifestyle  . Physical activity:    Days per week: Not on file    Minutes per session: Not on file  . Stress: Not on file  Relationships  . Social connections:    Talks on phone: Not on  file    Gets together: Not on file    Attends religious service: Not on file    Active member of club or organization: Not on file    Attends meetings of clubs or organizations: Not on file    Relationship status: Not on file  . Intimate partner violence:    Fear of current or ex partner: Not on file    Emotionally abused: Not on file    Physically abused: Not on file    Forced sexual activity: Not on file  Other Topics Concern  . Not on file  Social History Narrative   Lives at home with her husband.   Right-handed.   1 cup caffeine per day, occasionally more.     PHYSICAL EXAM   Vitals:   11/28/17 1308  BP: (!) 154/88  Pulse: 88  Weight: (!) 372 lb 8 oz (169 kg)  Height: 5' 8.5" (1.74 m)    Not recorded      Body mass index is 55.81 kg/m.  PHYSICAL EXAMNIATION:  Gen: NAD, conversant, well nourised, obese, well groomed                     Cardiovascular: Regular rate rhythm, no peripheral edema, warm, nontender. Eyes: Conjunctivae clear without exudates or hemorrhage Neck: Supple, no carotid bruits. Pulmonary: Clear to auscultation bilaterally   NEUROLOGICAL EXAM:  MENTAL STATUS: Speech:    Speech is normal; fluent and spontaneous with normal comprehension.  Cognition:     Orientation to time, place and person     Normal recent and remote  memory     Normal Attention span and concentration     Normal Language, naming, repeating,spontaneous speech     Fund of knowledge   CRANIAL NERVES: CN II: Visual fields are full to confrontation, I was not able to visualize optic head, pupils are round equal and briskly reactive to light. CN III, IV, VI: extraocular movement are normal. No ptosis. CN V: Facial sensation is intact to pinprick in all 3 divisions bilaterally. Corneal responses are intact.  CN VII: Face is symmetric with normal eye closure and smile. CN VIII: Hearing is normal to rubbing fingers CN IX, X: Palate elevates symmetrically. Phonation is normal. CN XI: Head turning and shoulder shrug are intact CN XII: Tongue is midline with normal movements and no atrophy.  MOTOR: Her right foot looks swelling, erythematous,  there was no significant bilateral lower extremity proximal distal muscle weakness, but there examination was limited due to her big body habitus.  REFLEXES: Reflexes are 2+ and symmetric at the biceps, triceps, knees, and trace at ankles. Plantar responses are flexor.  SENSORY: Length dependent decrease to light touch, pinprick and vibratory sensation at the toes  COORDINATION: Rapid alternating movements and fine finger movements are intact. There is no dysmetria on finger-to-nose and heel-knee-shin.    GAIT/STANCE: Need to push up to get up from seated position, cautious, antalgic gait, also limited by her big body habitus  DIAGNOSTIC DATA (LABS, IMAGING, TESTING) - I reviewed patient records, labs, notes, testing and imaging myself where available.   ASSESSMENT AND PLAN  ANALAYAH BROOKE is a 57 y.o. 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, she is going through cardiac evaluation by Dr.Nishan  Possible intracranial hypertension  Based on previous ophthalmology evaluation  MRI of brain was normal  Refer her to fluoroscopy guided lumbar puncture for opening pressure  She did have rapid weight gain, high dose of steroid treatment over the past few years,  Continue Topamax 100 mg twice a day  Fioricet as needed did not help her headache significantly,  Will try low-dose Imitrex 25 mg as needed, may mix together with Zofran, Aleve, limit the use to less than twice each week  Long history of chronic low back pain, radiating pain to right lower extremity  MRI of lumbar in January 28, 2017 showed evidence of right foraminal disc protrusion at L2-3 with impingement of right L2 nerve roots, right side disc protrusion at L4-5 with impingement of right L5 nerve roots.  EMG nerve conduction study today showed no evidence of peripheral neuropathy, or active lumbosacral radiculopathy.  She is to continue gabapentin 300 mg 3 times a day, NSAIDs,  Her low back pain has much improved with epidural injection,  Patient is also under the evaluation of rheumatologist Dr.Aryal, [place], for abnormal serum markers, there is no evidence of vasculitic peripheral neuropathy based on history, and electrodiagnostic study.   Marcial Pacas, M.D. Ph.D.  [facility] [address] [place], [place] [address] Ph: [phone] Fax: [phone]  CC: Christain Sacramento, MD

## 2017-11-29 DIAGNOSIS — R51 Headache: Secondary | ICD-10-CM

## 2017-11-29 DIAGNOSIS — R519 Headache, unspecified: Secondary | ICD-10-CM | POA: Insufficient documentation

## 2017-12-03 ENCOUNTER — Telehealth (HOSPITAL_COMMUNITY): Payer: Self-pay | Admitting: *Deleted

## 2017-12-03 NOTE — Telephone Encounter (Signed)
Left message on voicemail per DPR in reference to upcoming appointment scheduled on 12/10/17 with detailed instructions given per Myocardial Perfusion Study Information Sheet for the test. LM to arrive 15 minutes early, and that it is imperative to arrive on time for appointment to keep from having the test rescheduled. If you need to cancel or reschedule your appointment, please call the office within 24 hours of your appointment. Failure to do so may result in a cancellation of your appointment, and a $50 no show fee. Phone number given for call back for any questions. Kirstie Peri

## 2017-12-10 ENCOUNTER — Ambulatory Visit (HOSPITAL_COMMUNITY): Payer: Commercial Managed Care - PPO

## 2017-12-11 ENCOUNTER — Ambulatory Visit (HOSPITAL_COMMUNITY): Payer: Commercial Managed Care - PPO

## 2017-12-14 ENCOUNTER — Ambulatory Visit
Admission: RE | Admit: 2017-12-14 | Discharge: 2017-12-14 | Disposition: A | Discharge status: Home / Self Care | Payer: Commercial Managed Care - PPO | Source: Ambulatory Visit | Attending: Neurology | Admitting: Neurology

## 2017-12-14 ENCOUNTER — Telehealth: Payer: Self-pay | Admitting: Neurology

## 2017-12-14 VITALS — BP 112/63 | HR 88 | Wt 370.0 lb

## 2017-12-14 DIAGNOSIS — R51 Headache: Secondary | ICD-10-CM

## 2017-12-14 DIAGNOSIS — R519 Headache, unspecified: Secondary | ICD-10-CM

## 2017-12-14 DIAGNOSIS — G932 Benign intracranial hypertension: Secondary | ICD-10-CM

## 2017-12-14 NOTE — Discharge Instructions (Signed)

## 2017-12-14 NOTE — Telephone Encounter (Signed)
Open pressure of 22 cm H2O on LP

## 2017-12-17 ENCOUNTER — Ambulatory Visit: Payer: Commercial Managed Care - PPO

## 2017-12-17 ENCOUNTER — Telehealth: Payer: Self-pay | Admitting: Neurology

## 2017-12-17 NOTE — Telephone Encounter (Signed)
Rn call patient about her CSF study. Rn stated per Dr. Krista Blue it did not show any abnormalities with exceptions of mildly  elevated glucose 85 which is correlated with elevated serum glucose. Pt stated she is on metformin, and should she stop it. Rn explain to pt she should not stop taking metformin at any point.Pt stated she was seeing the eye md today. Rn advised pt to have the eye doctor send a copy of the eye visit. Pt also recommend a copy of the LP being fax to her primary doctor. PT verbalized understanding.

## 2017-12-17 NOTE — Telephone Encounter (Signed)
Please call patient, CSF study showed no significant abnormalities, with exception of mildly elevated glucose 85, which is correlated with elevated serum glucose.

## 2017-12-18 NOTE — Telephone Encounter (Signed)
LP report fax ti  Dr. [name] per pts request. Fax receive and confirmed.

## 2017-12-20 ENCOUNTER — Encounter: Payer: Self-pay | Admitting: *Deleted

## 2017-12-20 ENCOUNTER — Telehealth: Payer: Self-pay | Admitting: Neurology

## 2017-12-20 MED ORDER — ACETAZOLAMIDE 250 MG PO TABS: 250.0000 mg | ORAL_TABLET | Freq: Three times a day (TID) | ORAL | 3 refills | Status: DC

## 2017-12-20 NOTE — Telephone Encounter (Signed)
Pt has called stating that her eye Dr  Dr Earlean Polka is supposed to be sending over all pt's information for Dr Krista Blue to view.  Pt wants it stressed that she is been unable to drive for 9 months and is very much ready to start driving again.  Pt stated that she was told there may be a pill Dr Krista Blue can prescribe for the eliminate the fluid which has caused her vision to go from 20/70 to 20/50.  Pt is asking that as soon as the RN for Dr Krista Blue is available that she be given a call.

## 2017-12-20 NOTE — Addendum Note (Signed)
Addended by: Noberto Retort C on: 12/20/2017 04:57 PM   Modules accepted: Orders

## 2017-12-20 NOTE — Telephone Encounter (Signed)
Spoke to patient - she is still having significant vision issues and headaches.  She is taking topiramate 100mg , two tablets at bedtime.  Her recent LP revealed an opening pressure of 22.  She would like to try an alternate medication.  Per vo by Dr. Krista Blue, taper off topiramate 100mg  by taking one tablet at bedtime for three days then stopping.  She would like the patient to start Diamox 250mg , one tablet BID after she is off the topiramate.  The patient is agreeable to this plan and the new medication has been sent to the pharmacy.  She will keep her pending appt with Dr. Krista Blue on 02/27/2018.

## 2017-12-24 ENCOUNTER — Ambulatory Visit (INDEPENDENT_AMBULATORY_CARE_PROVIDER_SITE_OTHER): Payer: Commercial Managed Care - PPO | Admitting: Pharmacist

## 2017-12-24 VITALS — BP 138/92 | HR 93 | Wt 365.0 lb

## 2017-12-24 DIAGNOSIS — I10 Essential (primary) hypertension: Secondary | ICD-10-CM

## 2017-12-24 NOTE — Progress Notes (Signed)
Patient ID: [name]                 DOB: [birth year]                      MRN: [id number]     HPI: [name] is a 57 y.o. female referred by Dr. Johnsie Cancel to pharmacy clinic for heart failure medication optimization. PMH is significant for anxiety, fatigue, constipation, dyspnea, GERD, and CAD. She recently obtained an echo which revealed newly reduced EF 35-40%, normal EF on echo in 2015. She was started on carvedilol 3.125 mg BID and entresto 24-26 mg BID. Concerns for rituximab-induced cardiomyopathy. She was referred to pharmacy clinic for medication optimization prior to appointment with heart failure specialist and possible right/left heart cath. At her most recent OV her carvedilol was increased to 6.25mg  BID and her entresto was continued at current dose due to elevated potassium.   She reports to clinic for follow up. She is extremely short of breath today as she walks back to the clinic, but she states this is better than before. She states that she was recently prescribed new medications from her neurologist that she absolutely will not take (azetazolamide) as it has a sulfa in it. Discussed this medication and that likely would help with fluid, but she declines to take it.   Pt dose not want to start any new medications and states that she needs to get off some of these pills.   She also brings a new blood pressure cuff that she wants to check on today as well.   Current HF meds: carvedilol 6.25 mg BID, entresto 24-26 mg BID BP goal: <130/80 mmHg  Family History: The patient's family history includes Birth defects in her cousin; Breast cancer in her paternal grandmother; CAD inv  her other; Diabetes in her father and mother; Hypertension in her father; Obesity in her father.  Social History: (-) tobacco; (-) alcohol  Diet: She tries to avoid eating when she doesn't feel hungry. She has stopped snacking at night. She admits to stress eating, but she is trying to avoid it. She  has tried to eat out less and avoid sweets and chocolate.  Exercise: She has joined [employer], but hasn't gone yet. She reports getting short of breath with minimal activity and that she tries to push herself until she gets too short of breath and sits down.   Home BP readings: has not been checking at home as just purchased cuff  Wt Readings from Last 3 Encounters:  12/14/17 (!) 370 lb (167.8 kg)  11/28/17 (!) 372 lb 8 oz (169 kg)  11/26/17 (!) 374 lb (169.6 kg)   BP Readings from Last 3 Encounters:  12/14/17 112/63  11/28/17 (!) 154/88  11/26/17 132/82   Pulse Readings from Last 3 Encounters:  12/14/17 88  11/28/17 88  11/26/17 91    Renal function: CrCl cannot be calculated (Patient's most recent lab result is older than the maximum 21 days allowed.).  Past Medical History:  Diagnosis Date  . Acute blood loss anemia 06/23/2013  . 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 Anxiety associated with depression 09/23/2012  . At high risk for falls 09/23/2012   She has had a fractured ankle and tendon tear with falls over the last couple years.  . Cardiomyopathy ([place])   . Chronic fatigue 05/11/2014  . Chronic right-sided low back pain with right-sided sciatica 12/26/2016  .  Colitis   . Constipation, chronic 09/27/2012  . 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 Diverticulitis of large intestine with perforation 09/23/2012  . Dyspnea 05/11/2014  . GERD (gastroesophageal reflux disease)   . Hemoptysis 06/23/2013  . History of endometriosis 1997  . Hx of tobacco use, presenting hazards to health 09/23/2012  . Hyperglycemia 07/24/2013  . Hypokalemia 07/20/2013  . ILD (interstitial lung disease) ([place]) 01/22/2014  . Leukocytosis 05/11/2014  . MPA (microscopic polyangiitis) ([place]) 2015  . Numbness   . Obesity (BMI 30-39.9) 09/23/2012  . Paresthesia 12/26/2016  . Perforation of sigmoid colon - stercoral 09/27/2012  . Physical deconditioning  04/30/2015  . 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  . Respiratory failure with hypoxia ([place]) 06/23/2013  . Right leg swelling 12/26/2016    Current Outpatient Medications on File Prior to Visit  Medication Sig Dispense Refill  . Acetaminophen (TYLENOL PO) Take by mouth as needed.    Marland Kitchen acetaZOLAMIDE (DIAMOX) 250 MG tablet Take 1 tablet (250 mg total) by mouth 3 (three) times daily. 60 tablet 3  . ALPRAZolam (XANAX) 1 MG tablet Take 1 tablet (1 mg total) by mouth 3 (three) times daily as needed for anxiety. 90 tablet 0  . buPROPion (WELLBUTRIN XL) 150 MG 24 hr tablet Take 450 mg by mouth daily.    . carvedilol (COREG) 6.25 MG tablet Take 1 tablet (6.25 mg total) by mouth 2 (two) times daily. 60 tablet 11  . citalopram (CELEXA) 40 MG tablet Take 40 mg by mouth daily.    Marland Kitchen dextromethorphan (DELSYM) 30 MG/5ML liquid Take by mouth as needed for cough.    . esomeprazole (NEXIUM) 40 MG capsule Take 40 mg by mouth daily at 12 noon.    . Fluticasone-Salmeterol (ADVAIR) 100-50 MCG/DOSE AEPB Inhale 1 puff into the lungs 2 (two) times daily.    . furosemide (LASIX) 20 MG tablet Take 1 tablet (20 mg total) by mouth daily. 90 tablet 3  . gabapentin (NEURONTIN) 300 MG capsule Take 1 capsule (300 mg total) by mouth 3 (three) times daily. 90 capsule 11  . meloxicam (MOBIC) 15 MG tablet Take 1 tablet (15 mg total) by mouth daily. 30 tablet 6  . metFORMIN (GLUCOPHAGE-XR) 500 MG 24 hr tablet Take 500 mg by mouth 2 (two) times daily.     . Multiple Vitamin (MULTIVITAMIN) tablet Take 1 tablet by mouth daily.    . mycophenolate (CELLCEPT) 500 MG tablet Take 1,000 mg by mouth 2 (two) times daily.     . polyethylene glycol (MIRALAX / GLYCOLAX) packet Take 17 g by mouth at bedtime.     . sacubitril-valsartan (ENTRESTO) 24-26 MG Take 1 tablet by mouth 2 (two) times daily. 60 tablet 11  . SUMAtriptan (IMITREX) 25 MG tablet Take 1 tablet (25 mg total) by mouth every 2 (two) hours as needed for migraine. May repeat in 2  hours if headache persists or recurs. 10 tablet 6  . topiramate (TOPAMAX) 100 MG tablet Take 1 tablet (100 mg total) by mouth 2 (two) times daily. 60 tablet 11   No current facility-administered medications on file prior to visit.     Allergies  Allergen Reactions  . Mucinex [Guaifenesin Er] Nausea And Vomiting  . Sulfa Antibiotics Nausea And Vomiting     Assessment/Plan:  1. Heart Failure - BP today slightly above goal. HR on high side Await BMET results. Will increase carvedilol to 12.5mg  BID and increase Entresto to midstrength 49/51mg  BID. Advised that she continue to monitor pressures  and follow up with Dr. Johnsie Cancel as scheduled. Will defer initiation of spironolactone for now given reluctance to take medications and wanting to decrease number of medications.   Called patient to discuss above. She is aware of results and to proceed as above.  Thank you, Lelan Pons. [facility], [place]  [address] [place], [place] [address]  Phone: [phone]; Fax: [phone] 12/24/2017 7:24 AM

## 2017-12-24 NOTE — Patient Instructions (Addendum)
Return for a follow up appointment in 4 weeks  Go to the lab today  Check your blood pressure at home daily (if able) and keep record of the readings.  Take your BP meds as follows: CONTINUE carvedilol 6.25mg  TWICE daily, furosemide 20mg  once daily, Entreso 24/26mg  twice daily  We will call you with the results of the lab work.   Bring all of your meds, your BP cuff and your record of home blood pressures to your next appointment.  Exercise as you're able, try to walk approximately 30 minutes per day.  Keep salt intake to a minimum, especially watch canned and prepared boxed foods.  Eat more fresh fruits and vegetables and fewer canned items.  Avoid eating in fast food restaurants.    HOW TO TAKE YOUR BLOOD PRESSURE: . Rest 5 minutes before taking your blood pressure. .  Don't smoke or drink caffeinated beverages for at least 30 minutes before. . Take your blood pressure before (not after) you eat. . Sit comfortably with your back supported and both feet on the floor (don't cross your legs). . Elevate your arm to heart level on a table or a desk. . Use the proper sized cuff. It should fit smoothly and snugly around your bare upper arm. There should be enough room to slip a fingertip under the cuff. The bottom edge of the cuff should be 1 inch above the crease of the elbow. . Ideally, take 3 measurements at one sitting and record the average.

## 2017-12-25 ENCOUNTER — Telehealth: Payer: Self-pay

## 2017-12-25 DIAGNOSIS — I10 Essential (primary) hypertension: Secondary | ICD-10-CM | POA: Insufficient documentation

## 2017-12-25 LAB — BASIC METABOLIC PANEL
BUN/Creatinine Ratio: 17 (ref 9–23)
BUN: 15 mg/dL (ref 6–24)
CHLORIDE: 102 mmol/L (ref 96–106)
CO2: 22 mmol/L (ref 20–29)
CREATININE: 0.86 mg/dL (ref 0.57–1.00)
Calcium: 9.3 mg/dL (ref 8.7–10.2)
GFR calc Af Amer: 87 mL/min/{1.73_m2} (ref >59)
GFR calc non Af Amer: 75 mL/min/{1.73_m2} (ref >59)
GLUCOSE: 110 mg/dL — AB (ref 65–99)
POTASSIUM: 4.3 mmol/L (ref 3.5–5.2)
SODIUM: 139 mmol/L (ref 134–144)

## 2017-12-25 MED ORDER — CARVEDILOL 12.5 MG PO TABS: 12.5000 mg | ORAL_TABLET | Freq: Two times a day (BID) | ORAL | 1 refills | Status: DC

## 2017-12-25 MED ORDER — SACUBITRIL-VALSARTAN 49-51 MG PO TABS: 1.0000 | ORAL_TABLET | Freq: Two times a day (BID) | ORAL | 1 refills | Status: DC

## 2017-12-25 NOTE — Telephone Encounter (Signed)
Called wanting to speak with [facility] regarding results

## 2017-12-27 NOTE — Telephone Encounter (Signed)
See OV from 12/16 for phone details.

## 2018-01-11 LAB — FUNGUS CULTURE W SMEAR
MICRO NUMBER:: 91463470
SMEAR:: NONE SEEN
SPECIMEN QUALITY: ADEQUATE

## 2018-01-11 LAB — GRAM STAIN
MICRO NUMBER:: 91463469
SPECIMEN QUALITY:: ADEQUATE

## 2018-01-11 LAB — GLUCOSE, CSF: Glucose, CSF: 85 mg/dL — ABNORMAL HIGH (ref 40–80)

## 2018-01-11 LAB — PROTEIN, CSF: Total Protein, CSF: 38 mg/dL (ref 15–45)

## 2018-01-11 LAB — CSF CELL COUNT WITH DIFFERENTIAL
RBC Count, CSF: 1 cells/uL (ref 0–10)
WBC, CSF: 1 cells/uL (ref 0–5)

## 2018-01-11 LAB — VDRL, CSF: SYPHILIS VDRL QUANT CSF: NONREACTIVE

## 2018-01-14 ENCOUNTER — Inpatient Hospital Stay (HOSPITAL_COMMUNITY)
Admission: RE | Admit: 2018-01-14 | Payer: Commercial Managed Care - PPO | Source: Ambulatory Visit | Admitting: Internal Medicine

## 2018-01-28 NOTE — Progress Notes (Deleted)
Cardiology Office Note   Date:  01/28/2018   ID:  [name], DOB 1960/05/24, MRN [id number]  PCP:  Christain Sacramento, MD  Cardiologist:   Jenkins Rouge, MD   No chief complaint on file.     History of Present Illness:  58 y.o. f/u for CHF. History of depression, polyangitis, alveolar hemorrhage  RX with rituximab and plasmapheresis as well as imuran and prednisone taper. Activity limited by back pain. She appears bipolar on interview TTE done 11/06/17 with EF  35-40% no valve disease. Riotuximab stopped. Started on coreg and entresto with doses titrated  But patient hesitant to start any other meds. Myovue was ordered October but patient did not want done   ***      Past Medical History:  Diagnosis Date  . Acute blood loss anemia 06/23/2013  . 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 Anxiety associated with depression 09/23/2012  . At high risk for falls 09/23/2012   She has had a fractured ankle and tendon tear with falls over the last couple years.  . Cardiomyopathy ([place])   . Chronic fatigue 05/11/2014  . Chronic right-sided low back pain with right-sided sciatica 12/26/2016  . Colitis   . Constipation, chronic 09/27/2012  . 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 Diverticulitis of large intestine with perforation 09/23/2012  . Dyspnea 05/11/2014  . GERD (gastroesophageal reflux disease)   . Hemoptysis 06/23/2013  . History of endometriosis 1997  . Hx of tobacco use, presenting hazards to health 09/23/2012  . Hyperglycemia 07/24/2013  . Hypokalemia 07/20/2013  . ILD (interstitial lung disease) ([place]) 01/22/2014  . Leukocytosis 05/11/2014  . MPA (microscopic polyangiitis) ([place]) 2015  . Numbness   . Obesity (BMI 30-39.9) 09/23/2012  . Paresthesia 12/26/2016  . Perforation of sigmoid colon - stercoral 09/27/2012  . Physical deconditioning 04/30/2015  . 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  . Respiratory failure with  hypoxia ([place]) 06/23/2013  . Right leg swelling 12/26/2016    Past Surgical History:  Procedure Laterality Date  . APPENDECTOMY    . BLADDER REPAIR    . LAPAROSCOPY  1997   dx of endometriosis  . LAPAROSCOPY  04/28/2000    Laparoscopy with lysis of adhesions, hysteroscopy, D&C.  Marland Kitchen TONSILLECTOMY       Current Outpatient Medications  Medication Sig Dispense Refill  . Acetaminophen (TYLENOL PO) Take by mouth as needed.    Marland Kitchen acetaZOLAMIDE (DIAMOX) 250 MG tablet Take 1 tablet (250 mg total) by mouth 3 (three) times daily. 60 tablet 3  . ALPRAZolam (XANAX) 1 MG tablet Take 1 tablet (1 mg total) by mouth 3 (three) times daily as needed for anxiety. 90 tablet 0  . buPROPion (WELLBUTRIN XL) 150 MG 24 hr tablet Take 450 mg by mouth daily.    . carvedilol (COREG) 12.5 MG tablet Take 1 tablet (12.5 mg total) by mouth 2 (two) times daily. 60 tablet 1  . citalopram (CELEXA) 40 MG tablet Take 40 mg by mouth daily.    Marland Kitchen dextromethorphan (DELSYM) 30 MG/5ML liquid Take by mouth as needed for cough.    . esomeprazole (NEXIUM) 40 MG capsule Take 40 mg by mouth daily at 12 noon.    . Fluticasone-Salmeterol (ADVAIR) 100-50 MCG/DOSE AEPB Inhale 1 puff into the lungs 2 (two) times daily.    . furosemide (LASIX) 20 MG tablet Take 1 tablet (20 mg total) by mouth daily. 90 tablet 3  . gabapentin (NEURONTIN) 300 MG  capsule Take 1 capsule (300 mg total) by mouth 3 (three) times daily. 90 capsule 11  . meloxicam (MOBIC) 15 MG tablet Take 1 tablet (15 mg total) by mouth daily. 30 tablet 6  . metFORMIN (GLUCOPHAGE-XR) 500 MG 24 hr tablet Take 500 mg by mouth 2 (two) times daily.     . Multiple Vitamin (MULTIVITAMIN) tablet Take 1 tablet by mouth daily.    . mycophenolate (CELLCEPT) 500 MG tablet Take 1,000 mg by mouth 2 (two) times daily.     . polyethylene glycol (MIRALAX / GLYCOLAX) packet Take 17 g by mouth at bedtime.     . sacubitril-valsartan (ENTRESTO) 49-51 MG Take 1 tablet by mouth 2 (two) times daily. 60  tablet 1  . SUMAtriptan (IMITREX) 25 MG tablet Take 1 tablet (25 mg total) by mouth every 2 (two) hours as needed for migraine. May repeat in 2 hours if headache persists or recurs. 10 tablet 6  . topiramate (TOPAMAX) 100 MG tablet Take 1 tablet (100 mg total) by mouth 2 (two) times daily. 60 tablet 11   No current facility-administered medications for this visit.     Allergies:   Mucinex [guaifenesin er] and Sulfa antibiotics    Social History:  The patient  reports that she quit smoking about 17 years ago. She has a 27.00 pack-year smoking history. She has never used smokeless tobacco. She reports that she does not drink alcohol or use drugs.   Family History:  The patient's family history includes Birth defects in her cousin; Breast cancer in her paternal grandmother; CAD in an other family member; Diabetes in her father and mother; Hypertension in her father; Obesity in her father.    ROS:  Please see the history of present illness.   Otherwise, review of systems are positive for none.   All other systems are reviewed and negative.    PHYSICAL EXAM: VS:  There were no vitals taken for this visit. , BMI There is no height or weight on file to calculate BMI. Affect appropriate Chronically ill Obese / Cushingoid  HEENT: normal Neck supple with no adenopathy JVP normal no bruits no thyromegaly Lungs clear with no wheezing and good diaphragmatic motion Heart:  S1/S2 no murmur, no rub, gallop or click PMI normal Abdomen: benighn, BS positve, no tenderness, no AAA no bruit.  No HSM or HJR Distal pulses intact with no bruits No edema Neuro non-focal Skin warm and dry No muscular weakness     EKG:  SR rate 97 nonspecific ST changes    Recent Labs: 03/31/2017: Hemoglobin 12.6; Platelets 303 12/24/2017: BUN 15; Creatinine, Ser 0.86; Potassium 4.3; Sodium 139    Lipid Panel    Component Value Date/Time   TRIG 159 (H) 09/30/2012 0545      Wt Readings from Last 3  Encounters:  12/24/17 (!) 365 lb (165.6 kg)  12/14/17 (!) 370 lb (167.8 kg)  11/28/17 (!) 372 lb 8 oz (169 kg)      Other studies Reviewed: Additional studies/ records that were reviewed today include: Notes from pulmonary and neurology hospitalization at [facility] CT scan TTE 2015 labs .    ASSESSMENT AND PLAN:  1.  CAD:  Minor coronary calcium seen on chest CT no chest pain discussed need for right and left heart  Cath in setting of dyspnea and low EF *** 2. Dyspnea: multifactorial with previous lung hemorrhage and obesity CHF improved with Rx 3. Rheumatology:  Continue Celcept may need pulse steroids serial ESR 4.  DM:  Discussed low carb diet.  Target hemoglobin A1c is 6.5 or less.  Continue current medications. 5. Neuro:  MRI negative  patient appears to be bipolar and should be considered for manic like meds Abilify and not just antidepressants    Current medicines are reviewed at length with the patient today.  The patient does not have concerns regarding medicines.  The following changes have been made:  no change  Labs/ tests ordered today include: ***  No orders of the defined types were placed in this encounter.    Disposition:   FU with cardiology in 6 months    Signed, Jenkins Rouge, MD  01/28/2018 12:57 PM    [facility] [place], [place], [place]  [address] Phone: [phone]; Fax: [phone]

## 2018-01-30 ENCOUNTER — Ambulatory Visit: Payer: Commercial Managed Care - PPO | Admitting: Cardiovascular Disease

## 2018-01-30 DIAGNOSIS — R0989 Other specified symptoms and signs involving the circulatory and respiratory systems: Secondary | ICD-10-CM

## 2018-01-31 ENCOUNTER — Encounter: Payer: Self-pay | Admitting: Cardiovascular Disease

## 2018-02-13 NOTE — Progress Notes (Signed)
Cardiology Office Note   Date:  02/18/2018   ID:  [name], DOB Feb 23, 1960, MRN [id number]  PCP:  Christain Sacramento, MD  Cardiologist:   Jenkins Rouge, MD   No chief complaint on file.     History of Present Illness:  58 y.o. f/u for CHF. History of depression, polyangitis, alveolar hemorrhage  RX with rituximab and plasmapheresis as well as imuran and prednisone taper. Activity limited by back pain. She appears bipolar on interview TTE done 11/06/17 with EF  35-40% no valve disease. Riotuximab stopped. Started on coreg and entresto with doses titrated  But patient hesitant to start any other meds. Myovue was ordered October but patient did not want done   She continues to be manic appearing with focus on her immune issues.  Discussed needing to r/o CAD as source of her cardiomyopathy and why she Never showed up for her stress test       Past Medical History:  Diagnosis Date  . Acute blood loss anemia 06/23/2013  . 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 Anxiety associated with depression 09/23/2012  . At high risk for falls 09/23/2012   She has had a fractured ankle and tendon tear with falls over the last couple years.  . Cardiomyopathy ([place])   . Chronic fatigue 05/11/2014  . Chronic right-sided low back pain with right-sided sciatica 12/26/2016  . Colitis   . Constipation, chronic 09/27/2012  . 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 Diverticulitis of large intestine with perforation 09/23/2012  . Dyspnea 05/11/2014  . GERD (gastroesophageal reflux disease)   . Hemoptysis 06/23/2013  . History of endometriosis 1997  . Hx of tobacco use, presenting hazards to health 09/23/2012  . Hyperglycemia 07/24/2013  . Hypokalemia 07/20/2013  . ILD (interstitial lung disease) ([place]) 01/22/2014  . Leukocytosis 05/11/2014  . MPA (microscopic polyangiitis) ([place]) 2015  . Numbness   . Obesity (BMI 30-39.9) 09/23/2012  . Paresthesia  12/26/2016  . Perforation of sigmoid colon - stercoral 09/27/2012  . Physical deconditioning 04/30/2015  . 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  . Respiratory failure with hypoxia ([place]) 06/23/2013  . Right leg swelling 12/26/2016    Past Surgical History:  Procedure Laterality Date  . APPENDECTOMY    . BLADDER REPAIR    . LAPAROSCOPY  1997   dx of endometriosis  . LAPAROSCOPY  04/28/2000    Laparoscopy with lysis of adhesions, hysteroscopy, D&C.  Marland Kitchen TONSILLECTOMY       Current Outpatient Medications  Medication Sig Dispense Refill  . Acetaminophen (TYLENOL PO) Take by mouth as needed.    Marland Kitchen acetaZOLAMIDE (DIAMOX) 250 MG tablet Take 1 tablet (250 mg total) by mouth 3 (three) times daily. 60 tablet 3  . ALPRAZolam (XANAX) 1 MG tablet Take 1 tablet (1 mg total) by mouth 3 (three) times daily as needed for anxiety. 90 tablet 0  . buPROPion (WELLBUTRIN XL) 150 MG 24 hr tablet Take 450 mg by mouth daily.    . carvedilol (COREG) 12.5 MG tablet Take 1 tablet (12.5 mg total) by mouth 2 (two) times daily. 180 tablet 3  . citalopram (CELEXA) 40 MG tablet Take 40 mg by mouth daily.    Marland Kitchen dextromethorphan (DELSYM) 30 MG/5ML liquid Take by mouth as needed for cough.    . esomeprazole (NEXIUM) 40 MG capsule Take 40 mg by mouth daily at 12 noon.    . Fluticasone-Salmeterol (ADVAIR) 100-50 MCG/DOSE AEPB Inhale 1 puff into  the lungs 2 (two) times daily.    . furosemide (LASIX) 20 MG tablet Take 1 tablet (20 mg total) by mouth daily. 90 tablet 3  . gabapentin (NEURONTIN) 300 MG capsule Take 1 capsule (300 mg total) by mouth 3 (three) times daily. 90 capsule 11  . meloxicam (MOBIC) 15 MG tablet Take 1 tablet (15 mg total) by mouth daily. 30 tablet 6  . metFORMIN (GLUCOPHAGE-XR) 500 MG 24 hr tablet Take 500 mg by mouth as needed.     . Multiple Vitamin (MULTIVITAMIN) tablet Take 1 tablet by mouth daily.    . mycophenolate (CELLCEPT) 500 MG tablet Take 1,000 mg by mouth 2 (two) times daily.     .  polyethylene glycol (MIRALAX / GLYCOLAX) packet Take 17 g by mouth at bedtime.     . sacubitril-valsartan (ENTRESTO) 49-51 MG Take 1 tablet by mouth 2 (two) times daily. 180 tablet 3  . SUMAtriptan (IMITREX) 25 MG tablet Take 1 tablet (25 mg total) by mouth every 2 (two) hours as needed for migraine. May repeat in 2 hours if headache persists or recurs. 10 tablet 6   No current facility-administered medications for this visit.     Allergies:   Mucinex [guaifenesin er] and Sulfa antibiotics    Social History:  The patient  reports that she quit smoking about 17 years ago. She has a 27.00 pack-year smoking history. She has never used smokeless tobacco. She reports that she does not drink alcohol or use drugs.   Family History:  The patient's family history includes Birth defects in her cousin; Breast cancer in her paternal grandmother; CAD in an other family member; Diabetes in her father and mother; Hypertension in her father; Obesity in her father.    ROS:  Please see the history of present illness.   Otherwise, review of systems are positive for none.   All other systems are reviewed and negative.    PHYSICAL EXAM: VS:  BP 132/84   Pulse 100   Ht 5' 8.5" (1.74 m)   Wt (!) 367 lb (166.5 kg)   BMI 54.99 kg/m  , BMI Body mass index is 54.99 kg/m. Affect appropriate Chronically ill Obese / Cushingoid  HEENT: normal Neck supple with no adenopathy JVP normal no bruits no thyromegaly Lungs clear with no wheezing and good diaphragmatic motion Heart:  S1/S2 no murmur, no rub, gallop or click PMI normal Abdomen: benighn, BS positve, no tenderness, no AAA no bruit.  No HSM or HJR Distal pulses intact with no bruits No edema Neuro non-focal Skin warm and dry No muscular weakness     EKG:  SR rate 97 nonspecific ST changes    Recent Labs: 03/31/2017: Hemoglobin 12.6; Platelets 303 12/24/2017: BUN 15; Creatinine, Ser 0.86; Potassium 4.3; Sodium 139    Lipid Panel      Component Value Date/Time   TRIG 159 (H) 09/30/2012 0545      Wt Readings from Last 3 Encounters:  02/18/18 (!) 367 lb (166.5 kg)  12/24/17 (!) 365 lb (165.6 kg)  12/14/17 (!) 370 lb (167.8 kg)      Other studies Reviewed: Additional studies/ records that were reviewed today include: Notes from pulmonary and neurology hospitalization at [facility] CT scan TTE 2015 labs .    ASSESSMENT AND PLAN:  1.  CAD:  Minor coronary calcium seen on chest CT no chest pain myovue to r/o ischemic DMD  2. Dyspnea: multifactorial with previous lung hemorrhage and obesity CHF improved with Rx 3.  Rheumatology:  Continue Celcept may need pulse steroids serial ESR 4. DM:  Discussed low carb diet.  Target hemoglobin A1c is 6.5 or less.  Continue current medications. 5. Neuro:  MRI negative  patient appears to be bipolar and should be considered for manic like meds Abilify and not just antidepressants    Current medicines are reviewed at length with the patient today.  The patient does not have concerns regarding medicines.  The following changes have been made:  no change  Labs/ tests ordered today include: Lexi Myovue   Orders Placed This Encounter  Procedures  . MYOCARDIAL PERFUSION IMAGING  . ECHOCARDIOGRAM COMPLETE     Disposition:   FU with cardiology in 6 months    Signed, Jenkins Rouge, MD  02/18/2018 5:11 PM    [facility] [address] [place], [place], [place]  [address] Phone: [phone]; Fax: [phone]

## 2018-02-18 ENCOUNTER — Encounter (INDEPENDENT_AMBULATORY_CARE_PROVIDER_SITE_OTHER): Payer: Self-pay

## 2018-02-18 ENCOUNTER — Ambulatory Visit: Payer: Commercial Managed Care - PPO | Admitting: Cardiovascular Disease

## 2018-02-18 VITALS — BP 132/84 | HR 100 | Ht 68.5 in | Wt 367.0 lb

## 2018-02-18 DIAGNOSIS — I429 Cardiomyopathy, unspecified: Secondary | ICD-10-CM | POA: Diagnosis not present

## 2018-02-18 MED ORDER — SACUBITRIL-VALSARTAN 49-51 MG PO TABS: 1.0000 | ORAL_TABLET | Freq: Two times a day (BID) | ORAL | 3 refills | Status: DC

## 2018-02-18 MED ORDER — CARVEDILOL 12.5 MG PO TABS: 12.5000 mg | ORAL_TABLET | Freq: Two times a day (BID) | ORAL | 3 refills | Status: DC

## 2018-02-18 NOTE — Patient Instructions (Addendum)
Medication Instructions:   If you need a refill on your cardiac medications before your next appointment, please call your pharmacy.   Lab work:  If you have labs (blood work) drawn today and your tests are completely normal, you will receive your results only by: Marland Kitchen MyChart Message (if you have MyChart) OR . A paper copy in the mail If you have any lab test that is abnormal or we need to change your treatment, we will call you to review the results.  Testing/Procedures: Your physician has requested that you have an echocardiogram in 6 months. Echocardiography is a painless test that uses sound waves to create images of your heart. It provides your doctor with information about the size and shape of your heart and how well your heart's chambers and valves are working. This procedure takes approximately one hour. There are no restrictions for this procedure.  Your physician has requested that you have a lexiscan myoview. For further information please visit [email or web address]. Please follow instruction sheet, as given.  Follow-Up: At [facility], you and your health needs are our priority.  As part of our continuing mission to provide you with exceptional heart care, we have created designated Provider Care Teams.  These Care Teams include your primary Cardiologist (physician) and Advanced Practice Providers (APPs -  Physician Assistants and Nurse Practitioners) who all work together to provide you with the care you need, when you need it. You will need a follow up appointment in 6 months.  Please call our office 2 months in advance to schedule this appointment.  You may see Jenkins Rouge, MD or one of the following Advanced Practice Providers on your designated Care Team:   Truitt Merle, NP Cecilie Kicks, NP . Kathyrn Drown, NP

## 2018-02-27 ENCOUNTER — Ambulatory Visit: Payer: Commercial Managed Care - PPO | Admitting: Neurology

## 2018-02-27 ENCOUNTER — Telehealth (HOSPITAL_COMMUNITY): Payer: Self-pay | Admitting: *Deleted

## 2018-02-27 ENCOUNTER — Telehealth: Payer: Self-pay | Admitting: *Deleted

## 2018-02-27 NOTE — Telephone Encounter (Signed)
No showed follow up appointment. 

## 2018-02-27 NOTE — Telephone Encounter (Signed)
Left message on voicemail per DPR in reference to upcoming appointment scheduled on 03/04/18 at 1315 with detailed instructions given per Myocardial Perfusion Study Information Sheet for the test. LM to arrive 15 minutes early, and that it is imperative to arrive on time for appointment to keep from having the test rescheduled. If you need to cancel or reschedule your appointment, please call the office within 24 hours of your appointment. Failure to do so may result in a cancellation of your appointment, and a $50 no show fee. Phone number given for call back for any questions. Editha Bridgeforth, Ranae Palms  Mychart app pending

## 2018-03-01 ENCOUNTER — Telehealth (HOSPITAL_COMMUNITY): Payer: Self-pay | Admitting: Radiology

## 2018-03-01 NOTE — Telephone Encounter (Signed)
Patient given detailed instructions per Myocardial Perfusion Study Information Sheet for the test on 03/04/2018 at 1:15. Patient notified to arrive 15 minutes early and that it is imperative to arrive on time for appointment to keep from having the test rescheduled.  If you need to cancel or reschedule your appointment, please call the office within 24 hours of your appointment. . Patient verbalized understanding.EHK

## 2018-03-04 ENCOUNTER — Encounter: Payer: Self-pay | Admitting: Neurology

## 2018-03-04 ENCOUNTER — Ambulatory Visit (HOSPITAL_COMMUNITY): Payer: Commercial Managed Care - PPO | Attending: Cardiology

## 2018-03-04 ENCOUNTER — Ambulatory Visit (HOSPITAL_COMMUNITY): Payer: Commercial Managed Care - PPO

## 2018-03-04 DIAGNOSIS — I429 Cardiomyopathy, unspecified: Secondary | ICD-10-CM | POA: Diagnosis not present

## 2018-03-04 MED ORDER — REGADENOSON 0.4 MG/5ML IV SOLN
0.4000 mg | Freq: Once | INTRAVENOUS | Status: AC
  Administered 2018-03-04: 0.4 mg via INTRAVENOUS

## 2018-03-04 MED ORDER — TECHNETIUM TC 99M TETROFOSMIN IV KIT
33.0000 | PACK | Freq: Once | INTRAVENOUS | Status: AC | PRN
  Administered 2018-03-04: 33 via INTRAVENOUS
  Filled 2018-03-04: qty 33

## 2018-03-05 ENCOUNTER — Ambulatory Visit (HOSPITAL_COMMUNITY): Payer: Commercial Managed Care - PPO | Attending: Cardiovascular Disease

## 2018-03-05 ENCOUNTER — Ambulatory Visit (HOSPITAL_COMMUNITY): Payer: Commercial Managed Care - PPO

## 2018-03-05 LAB — MYOCARDIAL PERFUSION IMAGING
LV sys vol: 21 mL
LVDIAVOL: 65 mL (ref 46–106)
Peak HR: 123 {beats}/min
Rest HR: 105 {beats}/min
SDS: 3
SRS: 0
SSS: 3
TID: 1

## 2018-03-05 MED ORDER — TECHNETIUM TC 99M TETROFOSMIN IV KIT
32.3000 | PACK | Freq: Once | INTRAVENOUS | Status: AC | PRN
  Administered 2018-03-05: 32.3 via INTRAVENOUS
  Filled 2018-03-05: qty 33

## 2018-03-06 ENCOUNTER — Telehealth: Payer: Self-pay | Admitting: Cardiovascular Disease

## 2018-03-06 NOTE — Telephone Encounter (Signed)
Patient aware of results.

## 2018-03-06 NOTE — Telephone Encounter (Signed)
Pt thought she got a call about results from her two part test, but there was no message on her machine. She was just calling to have someone give her those results

## 2018-03-11 DIAGNOSIS — Z0289 Encounter for other administrative examinations: Secondary | ICD-10-CM

## 2018-03-13 ENCOUNTER — Inpatient Hospital Stay (HOSPITAL_COMMUNITY)
Admission: RE | Admit: 2018-03-13 | Payer: Commercial Managed Care - PPO | Source: Ambulatory Visit | Admitting: Internal Medicine

## 2018-03-13 ENCOUNTER — Telehealth (HOSPITAL_COMMUNITY): Payer: Self-pay | Admitting: Cardiology
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
## 2018-03-13 NOTE — Telephone Encounter (Signed)
Patient returned my call and confirmed appt for 03/15/18.

## 2018-03-15 ENCOUNTER — Ambulatory Visit (HOSPITAL_COMMUNITY)
Admission: RE | Admit: 2018-03-15 | Discharge: 2018-03-15 | Disposition: A | Discharge status: Home / Self Care | Payer: Commercial Managed Care - PPO | Source: Ambulatory Visit | Attending: Internal Medicine | Admitting: Internal Medicine

## 2018-03-15 VITALS — BP 116/68 | HR 81 | Wt 372.8 lb

## 2018-03-15 DIAGNOSIS — Z8249 Family history of ischemic heart disease and other diseases of the circulatory system: Secondary | ICD-10-CM | POA: Diagnosis not present

## 2018-03-15 DIAGNOSIS — I429 Cardiomyopathy, unspecified: Secondary | ICD-10-CM

## 2018-03-15 DIAGNOSIS — W19XXXD Unspecified fall, subsequent encounter: Secondary | ICD-10-CM | POA: Insufficient documentation

## 2018-03-15 DIAGNOSIS — Z791 Long term (current) use of non-steroidal anti-inflammatories (NSAID): Secondary | ICD-10-CM | POA: Diagnosis not present

## 2018-03-15 DIAGNOSIS — R0602 Shortness of breath: Secondary | ICD-10-CM

## 2018-03-15 DIAGNOSIS — R0601 Orthopnea: Secondary | ICD-10-CM | POA: Diagnosis not present

## 2018-03-15 DIAGNOSIS — Z79899 Other long term (current) drug therapy: Secondary | ICD-10-CM | POA: Insufficient documentation

## 2018-03-15 DIAGNOSIS — E669 Obesity, unspecified: Secondary | ICD-10-CM | POA: Insufficient documentation

## 2018-03-15 DIAGNOSIS — M317 Microscopic polyangiitis: Secondary | ICD-10-CM | POA: Diagnosis not present

## 2018-03-15 DIAGNOSIS — Z6841 Body Mass Index (BMI) 40.0 and over, adult: Secondary | ICD-10-CM | POA: Diagnosis not present

## 2018-03-15 DIAGNOSIS — Z87891 Personal history of nicotine dependence: Secondary | ICD-10-CM | POA: Insufficient documentation

## 2018-03-15 DIAGNOSIS — R0600 Dyspnea, unspecified: Secondary | ICD-10-CM | POA: Insufficient documentation

## 2018-03-15 DIAGNOSIS — I5022 Chronic systolic (congestive) heart failure: Secondary | ICD-10-CM | POA: Diagnosis present

## 2018-03-15 DIAGNOSIS — R9430 Abnormal result of cardiovascular function study, unspecified: Secondary | ICD-10-CM | POA: Diagnosis not present

## 2018-03-15 DIAGNOSIS — F319 Bipolar disorder, unspecified: Secondary | ICD-10-CM | POA: Insufficient documentation

## 2018-03-15 DIAGNOSIS — Z7984 Long term (current) use of oral hypoglycemic drugs: Secondary | ICD-10-CM | POA: Insufficient documentation

## 2018-03-15 LAB — BASIC METABOLIC PANEL
Anion gap: 8 (ref 5–15)
BUN: 22 mg/dL — AB (ref 6–20)
CO2: 23 mmol/L (ref 22–32)
Calcium: 8.7 mg/dL — ABNORMAL LOW (ref 8.9–10.3)
Chloride: 106 mmol/L (ref 98–111)
Creatinine, Ser: 0.76 mg/dL (ref 0.44–1.00)
GFR calc Af Amer: >60 mL/min (ref >60)
GFR calc non Af Amer: >60 mL/min (ref >60)
Glucose, Bld: 150 mg/dL — ABNORMAL HIGH (ref 70–99)
Potassium: 4.2 mmol/L (ref 3.5–5.1)
Sodium: 137 mmol/L (ref 135–145)

## 2018-03-15 LAB — BRAIN NATRIURETIC PEPTIDE: B Natriuretic Peptide: 44.6 pg/mL (ref 0.0–100.0)

## 2018-03-15 NOTE — Patient Instructions (Addendum)
Labs today We will only contact you if something comes back abnormal or we need to make some changes. Otherwise no news is good news!  Your physician recommends that you schedule a follow-up appointment in: 1 month with NP/PA clinic  Your physician has requested that you have an echocardiogram. Echocardiography is a painless test that uses sound waves to create images of your heart. It provides your doctor with information about the size and shape of your heart and how well your heart's chambers and valves are working. This procedure takes approximately one hour. There are no restrictions for this procedure.  Radiology will call you to schedule this test.

## 2018-03-17 NOTE — Progress Notes (Signed)
PCP: Christain Sacramento, MD Rheumatology: Dr. Domingo Sep Cardiology: Dr. Johnsie Cancel HF Cardiology: Dr. Aundra Dubin  58 yo with history of depression/?bipolar, microscopic polyangiitis, and intracranial hypertension presents for evaluation of CHF.  Patient had no known cardiac disease prior to the fall of 2019.  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  Due to exertional dyspnea, an echo was done in 10/19.  This showed EF 35-40% with mild-moderate LVH.  Prior echo in 2015 was normal. It was recommended that she stop rituximab.  She is now on mycophenolate.    Today, she has a multitude of complaints, most of which appear fairly chronic.  She has chronic significant exertional dyspnea.  No recent change, but it appears that she gets short of breath walking more than 100-200 feet. She has had long-term orthopnea and sleeps with multiple pillows.  She has eye pain that is being worked up by her ophthalmologist and has headaches that seem to have been attributed to intracranial hypertension of as yet undiscovered etiology.  She is on acetazolamide for the intracranial hypertension.   With fall in EF, she had a Cardiolite done in 2/20 after she had been off rituximab for several months.  This showed EF 68%, no ischemia or infarction.   ECG (10/19, personally reviewed): NSR, nonspecific ST changes.   Labs (12/19): K 4.3, creatinine 0.86  PMH: 1.  Depession, ?bipolar disorder.  2. H/o diverticulitis.  3. Microscopic polyangiitis: ANCA+ vasculitis.  No apparent renal involvement.   - H/o of diffuse alveolar hemorrhage.  - High resolution CT chest (4/19): no interstitial lung disease.  4. Intracranial hypertension: Followed by neurology.  5. 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  Echo in 2015 had been "normal."  - Cardiolite (2/20): EF 68%, no ischemia or infarction.   Family History  Problem Relation Age of Onset  . Diabetes Mother        AODM  . Diabetes  Father        AODM  . Hypertension Father   . Obesity Father   . Breast cancer Paternal Grandmother   . Birth defects Cousin   . CAD Other        [facility]   Social History   Socioeconomic History  . Marital status: Married    Spouse name: Not on file  . Number of children: 0  . Years of education: 75  . Highest education level: High school graduate  Occupational History  . Occupation: Disabled  Social Needs  . Financial resource strain: Not on file  . Food insecurity:    Worry: Not on file    Inability: Not on file  . Transportation needs:    Medical: Not on file    Non-medical: Not on file  Tobacco Use  . Smoking status: Former Smoker    Packs/day: 1.00    Years: 27.00    Pack years: 27.00    Last attempt to quit: 01/09/2001    Years since quitting: 17.1  . Smokeless tobacco: Never Used  Substance and Sexual Activity  . Alcohol use: No    Frequency: Never  . Drug use: No  . Sexual activity: Not on file  Lifestyle  . Physical activity:    Days per week: Not on file    Minutes per session: Not on file  . Stress: Not on file  Relationships  . Social connections:    Talks on phone: Not on file    Gets together: Not  on file    Attends religious service: Not on file    Active member of club or organization: Not on file    Attends meetings of clubs or organizations: Not on file    Relationship status: Not on file  . Intimate partner violence:    Fear of current or ex partner: Not on file    Emotionally abused: Not on file    Physically abused: Not on file    Forced sexual activity: Not on file  Other Topics Concern  . Not on file  Social History Narrative   Lives at home with her husband.   Right-handed.   1 cup caffeine per day, occasionally more.   ROS: All systems reviewed and negative except as per HPI.   Current Outpatient Medications  Medication Sig Dispense Refill  . Acetaminophen (TYLENOL PO) Take by mouth as needed.    Marland Kitchen acetaZOLAMIDE (DIAMOX) 250 MG  tablet Take 1 tablet (250 mg total) by mouth 3 (three) times daily. 60 tablet 3  . ALPRAZolam (XANAX) 1 MG tablet Take 1 tablet (1 mg total) by mouth 3 (three) times daily as needed for anxiety. 90 tablet 0  . buPROPion (WELLBUTRIN XL) 150 MG 24 hr tablet Take 450 mg by mouth daily.    . carvedilol (COREG) 12.5 MG tablet Take 1 tablet (12.5 mg total) by mouth 2 (two) times daily. 180 tablet 3  . citalopram (CELEXA) 40 MG tablet Take 40 mg by mouth daily.    Marland Kitchen dextromethorphan (DELSYM) 30 MG/5ML liquid Take by mouth as needed for cough.    . esomeprazole (NEXIUM) 40 MG capsule Take 40 mg by mouth daily at 12 noon.    . Fluticasone-Salmeterol (ADVAIR) 100-50 MCG/DOSE AEPB Inhale 1 puff into the lungs 2 (two) times daily.    Marland Kitchen gabapentin (NEURONTIN) 300 MG capsule Take 1 capsule (300 mg total) by mouth 3 (three) times daily. 90 capsule 11  . meloxicam (MOBIC) 15 MG tablet Take 1 tablet (15 mg total) by mouth daily. 30 tablet 6  . metFORMIN (GLUCOPHAGE-XR) 500 MG 24 hr tablet Take 500 mg by mouth as needed.     . Multiple Vitamin (MULTIVITAMIN) tablet Take 1 tablet by mouth daily.    . mycophenolate (CELLCEPT) 500 MG tablet Take 1,000 mg by mouth 2 (two) times daily.     . polyethylene glycol (MIRALAX / GLYCOLAX) packet Take 17 g by mouth at bedtime.     . sacubitril-valsartan (ENTRESTO) 49-51 MG Take 1 tablet by mouth 2 (two) times daily. 180 tablet 3  . SUMAtriptan (IMITREX) 25 MG tablet Take 1 tablet (25 mg total) by mouth every 2 (two) hours as needed for migraine. May repeat in 2 hours if headache persists or recurs. 10 tablet 6   No current facility-administered medications for this encounter.    BP 116/68   Pulse 81   Wt (!) 169.1 kg (372 lb 12.8 oz)   SpO2 98%   BMI 55.86 kg/m  General: NAD, obese.  Neck: No JVD, no thyromegaly or thyroid nodule.  Lungs: Clear to auscultation bilaterally with normal respiratory effort. CV: Nondisplaced PMI.  Heart regular S1/S2, no S3/S4, no murmur.   No peripheral edema.  No carotid bruit.  Normal pedal pulses.  Abdomen: Soft, nontender, no hepatosplenomegaly, no distention.  Skin: Intact without lesions or rashes.  Neurologic: Alert and oriented x 3.  Psych: Normal affect. Extremities: No clubbing or cyanosis.  HEENT: Normal.   Assessment/Plan: 1. Chronic systolic CHF: Patient  had a reportedly normal echo in 2015.  In 10/19, echo showed EF down to 35-40%.  Rituximab was stopped.  Cardiolite was done in 2/20, showing EF 68% with no evidence for ischemia or infarction.  Cardiomyopathy is not a common side effect of Rituximab, but there have been case reports.  NYHA class III symptoms, but I do not think dyspnea is caused by volume overload. I think obesity and deconditioning play a big role.   - I will get a repeat echo.  If echo confirms improvement in EF, it is certainly possible that Rituximab was the culprit for fall in EF.  Based on Cardiolite, doubt ischemia/infarction.  She will stay off Rituximab.  - Continue Coreg 12.5 mg bid and Entresto 49/51 bid.  - BMET/BNP today.  2. Microscopic polyangiitis: She is now on mycophenolate.  3. Depression/?bipolar: Pressured speech, seems somewhat manic. Think she needs mental health followup.   Followup in 1 month with APP after echo.  Loralie Champagne 03/17/2018

## 2018-03-26 ENCOUNTER — Ambulatory Visit (HOSPITAL_COMMUNITY): Payer: Commercial Managed Care - PPO | Attending: Cardiovascular Disease

## 2018-03-26 ENCOUNTER — Other Ambulatory Visit: Payer: Self-pay

## 2018-03-26 ENCOUNTER — Encounter (INDEPENDENT_AMBULATORY_CARE_PROVIDER_SITE_OTHER): Payer: Self-pay

## 2018-03-26 DIAGNOSIS — I429 Cardiomyopathy, unspecified: Secondary | ICD-10-CM

## 2018-03-26 MED ORDER — PERFLUTREN LIPID MICROSPHERE
1.0000 mL | INTRAVENOUS | Status: AC | PRN
  Administered 2018-03-26: 3 mL via INTRAVENOUS

## 2018-04-17 NOTE — Progress Notes (Signed)
Heart Failure TeleHealth Note  Due to national recommendations of social distancing due to [place] 19, telehealth visit is felt to be most appropriate for this patient at this time.  I discussed the limitations, risks, security and privacy concerns of performing an evaluation and management service by telephone and the availability of in person appointments. I also discussed with the patient that there may be a patient responsible charge related to this service. The patient expressed understanding and agreed to proceed.   ID:  [name], DOB 03-04-1960, MRN [id number]  Location: Home  Provider location: [address] [place] Type of Visit: Established patient   PCP:  Christain Sacramento, MD  Cardiologist:  Jenkins Rouge, MD Primary HF: Dr Aundra Dubin  Chief Complaint: chronic systolic HF   History of Present Illness: [name] is a 58 y.o. female with a history of depression/?bipolar, microscopic polyangiitis, and intracranial hypertension.  Patient had no known cardiac disease prior to the fall of 2019.  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  Due to exertional dyspnea, an echo was done in 10/19.  This showed EF 35-40% with mild-moderate LVH.  Prior echo in 2015 was normal. It was recommended that she stop rituximab.  She is now on mycophenolate.    Seen in Advanced HF clinic for the first time 03/15/18 and was set up for repeat echo, which showed normal EF >65%.  She presents via Engineer, civil (consulting) for a telehealth visit today. She has a lot of complaints today. Recently seen by eye doctor for an ulcer and is now on oral prednisone. She has more energy. She is SOB chronically. Wears O2 PRN and qHS, which helps with her breathing. No edema. +orthopnea chronically. No fever. Feels like her heart is pumping good. She is not weighing, but says her clothes are looser. Taking all medications. Only leaving the house for doctor's appointments.   O2: 92-93% HR: 70-80 generally, occasionally up to 100  Pt denies symptoms of cough, fevers, chills, or new SOB worrisome for COVID 19.   Past Medical History:  Diagnosis Date  . Acute blood loss anemia 06/23/2013  . 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[place]) 06/24/2013  . Anxiety associated with depression 09/23/2012  . At high risk for falls 09/23/2012   She has had a fractured ankle and tendon tear with falls over the last couple years.  . Cardiomyopathy ([place])   . Chronic fatigue 05/11/2014  . Chronic right-sided low back pain with right-sided sciatica 12/26/2016  . Colitis   . Constipation, chronic 09/27/2012  . 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 Diverticulitis of large intestine with perforation 09/23/2012  . Dyspnea 05/11/2014  . GERD (gastroesophageal reflux disease)   . Hemoptysis 06/23/2013  . History of endometriosis 1997  . Hx of tobacco use, presenting hazards to health 09/23/2012  . Hyperglycemia 07/24/2013  . Hypokalemia 07/20/2013  . ILD (interstitial lung disease) ([place]) 01/22/2014  . Leukocytosis 05/11/2014  . MPA (microscopic polyangiitis) ([place]) 2015  . Numbness   . Obesity (BMI 30-39.9) 09/23/2012  . Paresthesia 12/26/2016  . Perforation of sigmoid colon - stercoral 09/27/2012  . Physical deconditioning 04/30/2015  . 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  . Respiratory failure with hypoxia ([place]) 06/23/2013  . Right leg swelling 12/26/2016   Past Surgical History:  Procedure Laterality Date  . APPENDECTOMY    . BLADDER REPAIR    . LAPAROSCOPY  1997  dx of endometriosis  . LAPAROSCOPY  04/28/2000    Laparoscopy with lysis of adhesions, hysteroscopy, D&C.  Marland Kitchen TONSILLECTOMY       Current Outpatient Medications  Medication Sig Dispense Refill  . acetaZOLAMIDE (DIAMOX) 250 MG tablet Take 1 tablet (250 mg total) by mouth 3 (three) times daily. 60 tablet 3  . carvedilol (COREG) 12.5 MG tablet Take 1 tablet (12.5 mg total) by mouth  2 (two) times daily. 180 tablet 3  . predniSONE (DELTASONE) 20 MG tablet Take 60 mg by mouth daily with breakfast. On taper for ulcer in eyes    . sacubitril-valsartan (ENTRESTO) 49-51 MG Take 1 tablet by mouth 2 (two) times daily. 180 tablet 3  . Acetaminophen (TYLENOL PO) Take by mouth as needed.    . ALPRAZolam (XANAX) 1 MG tablet Take 1 tablet (1 mg total) by mouth 3 (three) times daily as needed for anxiety. 90 tablet 0  . buPROPion (WELLBUTRIN XL) 150 MG 24 hr tablet Take 450 mg by mouth daily.    . citalopram (CELEXA) 40 MG tablet Take 40 mg by mouth daily.    Marland Kitchen dextromethorphan (DELSYM) 30 MG/5ML liquid Take by mouth as needed for cough.    . esomeprazole (NEXIUM) 40 MG capsule Take 40 mg by mouth daily at 12 noon.    . Fluticasone-Salmeterol (ADVAIR) 100-50 MCG/DOSE AEPB Inhale 1 puff into the lungs 2 (two) times daily.    Marland Kitchen gabapentin (NEURONTIN) 300 MG capsule Take 1 capsule (300 mg total) by mouth 3 (three) times daily. 90 capsule 11  . meloxicam (MOBIC) 15 MG tablet Take 1 tablet (15 mg total) by mouth daily. 30 tablet 6  . metFORMIN (GLUCOPHAGE-XR) 500 MG 24 hr tablet Take 500 mg by mouth as needed.     . Multiple Vitamin (MULTIVITAMIN) tablet Take 1 tablet by mouth daily.    . mycophenolate (CELLCEPT) 500 MG tablet Take 1,000 mg by mouth 2 (two) times daily.     . polyethylene glycol (MIRALAX / GLYCOLAX) packet Take 17 g by mouth at bedtime.     . SUMAtriptan (IMITREX) 25 MG tablet Take 1 tablet (25 mg total) by mouth every 2 (two) hours as needed for migraine. May repeat in 2 hours if headache persists or recurs. 10 tablet 6   No current facility-administered medications for this encounter.     Allergies:   Mucinex [guaifenesin er] and Sulfa antibiotics   Social History:  The patient  reports that she quit smoking about 17 years ago. She has a 27.00 pack-year smoking history. She has never used smokeless tobacco. She reports that she does not drink alcohol or use drugs.    Family History:  The patient's family history includes Birth defects in her cousin; Breast cancer in her paternal grandmother; CAD in an other family member; Diabetes in her father and mother; Hypertension in her father; Obesity in her father.   ROS:  Please see the history of present illness.   All other systems are personally reviewed and negative.   Exam:  [facility] Call) General: Well appearing. No resp difficulty. Obese HEENT: Normal Abdomen: denies tenderness Extremities: R and LLE no edema.  Neuro: Alert & orientedx3, seems manic Lungs: Normal respiratory effort with conversation.    Recent Labs: 03/15/2018: B Natriuretic Peptide 44.6; BUN 22; Creatinine, Ser 0.76; Potassium 4.2; Sodium 137  Personally reviewed   Wt Readings from Last 3 Encounters:  03/15/18 (!) 169.1 kg (372 lb 12.8 oz)  03/04/18 (!) 166.5 kg (  367 lb)  02/18/18 (!) 166.5 kg (367 lb)      ASSESSMENT AND PLAN:  1. Chronic systolic CHF:  In 63/89, echo showed EF down to 35-40%.  Rituximab was stopped.  Cardiolite was done in 2/20, showing EF 68% with no evidence for ischemia or infarction.  Cardiomyopathy is not a common side effect of Rituximab, but there have been case reports. With improvement in EF, it is certainly possible that Rituximab was the culprit for fall in EF.  Based on Cardiolite, doubt ischemia/infarction.  She will stay off Rituximab.   - Echo 03/26/18 with normal EF >65%.  - NYHA class III symptoms, but do not think dyspnea is caused by volume overload. Obesity and deconditioning play a big role.  Volume sounds stable, but difficult to determine since she does not weigh. Does not require lasix.  - Continue Coreg 12.5 mg bid  - Entresto 49/51 bid. Creatinine stable 0.76 on 3/6.  - Will not adjust HF medications further with improvement in EF. We discussed that she should stay on HF medications even though her heart has recovered. Advised that she should not take rituximab in the future.   2.  Microscopic polyangiitis: She is now on mycophenolate.  - Follows with rheumatology  3. Depression/?bipolar:  - Seems manic today. Says that depression has been worse lately.  - Advised her to follow up with PCP for further management.   4. Snores - Has not had a sleep study. Not interested due to cost.  - Wears O2 PRN.   COVID screen The patient does not have any symptoms that suggest any further testing/ screening at this time.  Social distancing reinforced today.  Patient Risk: After full review of this patients clinical status, I feel that they are at moderate risk for cardiac decompensation at this time.  Orders/Follow up: She has follow up with Dr Johnsie Cancel in July (on recall list). She can be discharged from advanced HF clinic with recovery of EF. We will send a copy of this note to her PCP for continuity per patient request.    Signed, Georgiana Shore, NP  04/18/2018 3:15 PM  Today, I have spent 40 minutes with the patient with telehealth technology discussing the above issues.    [facility] [address] [place] [id number] [phone] (office) [phone] (fax)

## 2018-04-18 ENCOUNTER — Ambulatory Visit (HOSPITAL_COMMUNITY)
Admission: RE | Admit: 2018-04-18 | Discharge: 2018-04-18 | Disposition: A | Discharge status: Home / Self Care | Payer: Commercial Managed Care - PPO | Source: Ambulatory Visit | Attending: Internal Medicine | Admitting: Internal Medicine

## 2018-04-18 ENCOUNTER — Other Ambulatory Visit: Payer: Self-pay

## 2018-04-18 DIAGNOSIS — I5022 Chronic systolic (congestive) heart failure: Secondary | ICD-10-CM | POA: Diagnosis not present

## 2018-04-18 DIAGNOSIS — F329 Major depressive disorder, single episode, unspecified: Secondary | ICD-10-CM

## 2018-04-18 DIAGNOSIS — R0602 Shortness of breath: Secondary | ICD-10-CM

## 2018-04-18 DIAGNOSIS — F32A Depression, unspecified: Secondary | ICD-10-CM

## 2018-04-18 DIAGNOSIS — R0683 Snoring: Secondary | ICD-10-CM

## 2018-04-19 NOTE — Patient Instructions (Addendum)
Continue all your current medications  CONGRATULATIONS!!! You have graduated from the [facility], please follow up with Dr Johnsie Cancel in [place] can call his office at [phone] to schedule that appointment.

## 2018-04-19 NOTE — Addendum Note (Signed)
Encounter addended by: Scarlette Calico, RN on: 04/19/2018 8:21 AM  Actions taken: Clinical Note Signed

## 2018-04-19 NOTE — Progress Notes (Signed)
OV note faxed to Dr Redmond Pulling, AVS mailed to pt.

## 2018-06-10 ENCOUNTER — Other Ambulatory Visit: Payer: Self-pay

## 2018-06-10 ENCOUNTER — Telehealth: Payer: Self-pay | Admitting: *Deleted

## 2018-06-10 ENCOUNTER — Ambulatory Visit: Payer: Commercial Managed Care - PPO | Admitting: Neurology

## 2018-06-10 NOTE — Telephone Encounter (Signed)
No showed follow up appointment. 

## 2018-07-09 ENCOUNTER — Other Ambulatory Visit: Payer: Self-pay | Admitting: Rheumatology

## 2018-07-09 DIAGNOSIS — M317 Microscopic polyangiitis: Secondary | ICD-10-CM

## 2018-07-18 ENCOUNTER — Encounter (HOSPITAL_COMMUNITY): Payer: Self-pay | Admitting: Cardiology

## 2018-07-22 ENCOUNTER — Ambulatory Visit: Payer: Commercial Managed Care - PPO | Admitting: Neurology

## 2018-07-22 ENCOUNTER — Other Ambulatory Visit: Payer: Commercial Managed Care - PPO

## 2018-07-23 ENCOUNTER — Telehealth: Payer: Self-pay | Admitting: *Deleted

## 2018-07-23 ENCOUNTER — Encounter: Payer: Self-pay | Admitting: Neurology

## 2018-07-23 NOTE — Telephone Encounter (Signed)
No showed follow up appointment on 07/22/2018.

## 2018-07-24 ENCOUNTER — Ambulatory Visit
Admission: RE | Admit: 2018-07-24 | Discharge: 2018-07-24 | Disposition: A | Discharge status: Home / Self Care | Payer: Commercial Managed Care - PPO | Source: Ambulatory Visit | Attending: Rheumatology | Admitting: Rheumatology

## 2018-07-24 DIAGNOSIS — M317 Microscopic polyangiitis: Secondary | ICD-10-CM

## 2018-07-31 ENCOUNTER — Telehealth (HOSPITAL_COMMUNITY): Payer: Self-pay | Admitting: Radiology

## 2018-07-31 NOTE — Telephone Encounter (Signed)
Left message to call office-Patient needs to schedule an echocardiogram.  

## 2018-08-10 ENCOUNTER — Other Ambulatory Visit: Payer: Self-pay | Admitting: Neurology

## 2018-08-13 NOTE — Telephone Encounter (Signed)
Pt called and wanted to explain that she missed the appt due to her dad coming home from the hospital with hospice. She did not want provider to think she brushed off the appt, she just forgot about it dealing with her father's situation. Pt is appreciative of all the help she received but had a lot on her plate around the appt date that she missed.

## 2018-08-13 NOTE — Telephone Encounter (Signed)
Noted, the pt had 3 no shows since feb. Per the office policy patient was dismissed

## 2018-08-14 ENCOUNTER — Telehealth: Payer: Self-pay | Admitting: *Deleted

## 2018-08-14 NOTE — Telephone Encounter (Signed)
Lt voicemail unable to reach pt.

## 2018-08-20 ENCOUNTER — Telehealth (HOSPITAL_COMMUNITY): Payer: Self-pay

## 2018-08-20 NOTE — Telephone Encounter (Signed)
New messages  Just an FYI. We have made several attempts to contact this patient including sending a letter to schedule or reschedule their echocardiogram. We will be removing the patient from the echo WQ.   Thank you  echo appt on  8/12 patient cancel appt in March - Jesten Cappuccio  7.9.20 mail reminder letter Jari Sportsman  05/30/18 tried calling pt to sch, phone rings and then cuts off.[email or web address]

## 2018-08-21 ENCOUNTER — Other Ambulatory Visit (HOSPITAL_COMMUNITY): Payer: Commercial Managed Care - PPO

## 2018-08-24 ENCOUNTER — Other Ambulatory Visit: Payer: Self-pay | Admitting: Neurology

## 2018-09-18 NOTE — Progress Notes (Signed)
PCP: Christain Sacramento, MD Rheumatology: Dr. Domingo Sep Cardiology: Dr. Johnsie Cancel HF Cardiology: Dr. Aundra Dubin  58 yo with history of depression/?bipolar, microscopic polyangiitis, and intracranial hypertension presents for f/U CHF.  Patient had no known cardiac disease prior to the fall of 2019.  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  Due to exertional dyspnea, an echo was done in 10/19.  This showed EF 35-40% with mild-moderate LVH.  Prior echo in 2015 was normal. It was recommended that she stop rituximab.  She is now on mycophenolate.  Chronic dyspnea from obesity and deconditioning   She is on acetazolamide for the intracranial hypertension. Cardiolite done in 2/20 after she had been off rituximab for several months.  This showed EF 68%, no ischemia or infarction. F/U echo off rituximab 03/26/18 EF [id number] normal diastolic parameters   She was a bit manic today with rushed speech Under a lot of stress Father died in 29-Sep-2022, Mom had surgery for diabetic ulcer on foot with amputation and is a lot of work. She is seeing Dr Jenetta Downer Rheumatology for her polyangitis. She is getting methotrexate shots weekly.  High dose cellcept wasn't effective More LE edema Only other option would by Cytoxan  ECG (10/19, personally reviewed): NSR, nonspecific ST changes.   Labs (12/19): K 4.3, creatinine 0.86  PMH: 1.  Depession, ?bipolar disorder.  2. H/o diverticulitis.  3. Microscopic polyangiitis: ANCA+ vasculitis.  No apparent renal involvement.   - H/o of diffuse alveolar hemorrhage.  - High resolution CT chest (4/19): no interstitial lung disease.  4. Intracranial hypertension: Followed by neurology.  5. 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  Echo in 2015 had been "normal."  - Cardiolite (2/20): EF 68%, no ischemia or infarction.   Family History  Problem Relation Age of Onset  . Diabetes Mother        AODM  . Diabetes Father    AODM  . Hypertension Father   . Obesity Father   . Breast cancer Paternal Grandmother   . Birth defects Cousin   . CAD Other        [facility]   Social History   Socioeconomic History  . Marital status: Married    Spouse name: Not on file  . Number of children: 0  . Years of education: 51  . Highest education level: High school graduate  Occupational History  . Occupation: Disabled  Social Needs  . Financial resource strain: Not on file  . Food insecurity    Worry: Not on file    Inability: Not on file  . Transportation needs    Medical: Not on file    Non-medical: Not on file  Tobacco Use  . Smoking status: Former Smoker    Packs/day: 1.00    Years: 27.00    Pack years: 27.00    Quit date: 01/09/2001    Years since quitting: 17.7  . Smokeless tobacco: Never Used  Substance and Sexual Activity  . Alcohol use: No    Frequency: Never  . Drug use: No  . Sexual activity: Not on file  Lifestyle  . Physical activity    Days per week: Not on file    Minutes per session: Not on file  . Stress: Not on file  Relationships  . Social Herbalist on phone: Not on file    Gets together: Not on file    Attends religious service: Not on file  Active member of club or organization: Not on file    Attends meetings of clubs or organizations: Not on file    Relationship status: Not on file  . Intimate partner violence    Fear of current or ex partner: Not on file    Emotionally abused: Not on file    Physically abused: Not on file    Forced sexual activity: Not on file  Other Topics Concern  . Not on file  Social History Narrative   Lives at home with her husband.   Right-handed.   1 cup caffeine per day, occasionally more.   ROS: All systems reviewed and negative except as per HPI.   Current Outpatient Medications  Medication Sig Dispense Refill  . acetaZOLAMIDE (DIAMOX) 250 MG tablet Take 1 tablet (250 mg total) by mouth 3 (three) times daily. 60 tablet 3  .  ALPRAZolam (XANAX) 1 MG tablet Take 1 tablet (1 mg total) by mouth 3 (three) times daily as needed for anxiety. 90 tablet 0  . buPROPion (WELLBUTRIN XL) 150 MG 24 hr tablet Take 450 mg by mouth daily.    . carvedilol (COREG) 12.5 MG tablet Take 1 tablet (12.5 mg total) by mouth 2 (two) times daily. 180 tablet 3  . citalopram (CELEXA) 40 MG tablet Take 40 mg by mouth daily.    Marland Kitchen dextromethorphan (DELSYM) 30 MG/5ML liquid Take by mouth as needed for cough.    . esomeprazole (NEXIUM) 40 MG capsule Take 40 mg by mouth daily at 12 noon.    . Fluticasone-Salmeterol (ADVAIR) 100-50 MCG/DOSE AEPB Inhale 1 puff into the lungs 2 (two) times daily.    . metFORMIN (GLUCOPHAGE-XR) 500 MG 24 hr tablet Take 500 mg by mouth daily with breakfast.     . methotrexate (50 MG/ML) 1 g injection Inject into the vein once.    . Multiple Vitamin (MULTIVITAMIN) tablet Take 1 tablet by mouth daily.    . mycophenolate (CELLCEPT) 500 MG tablet Take 500 mg by mouth 3 (three) times daily.     . phentermine (ADIPEX-P) 37.5 MG tablet Take 37.5 mg by mouth daily.    . polyethylene glycol (MIRALAX / GLYCOLAX) packet Take 17 g by mouth at bedtime.     . prednisoLONE Sodium Phosphate (PREDNISOL OP) Apply 1 drop to eye 2 (two) times daily.    . sacubitril-valsartan (ENTRESTO) 49-51 MG Take 1 tablet by mouth 2 (two) times daily. 180 tablet 3   No current facility-administered medications for this visit.    BP 132/66   Pulse 98   Ht 5' 8.5" (1.74 m)   Wt (!) 378 lb (171.5 kg)   SpO2 97%   BMI 56.64 kg/m   Bipolar  Obese female  HEENT: normal Neck supple with no adenopathy JVP normal no bruits no thyromegaly Lungs clear with no wheezing and good diaphragmatic motion Heart:  S1/S2 no murmur, no rub, gallop or click PMI normal Abdomen: benighn, BS positve, no tenderness, no AAA no bruit.  No HSM or HJR Distal pulses intact with no bruits Plus one bilateral edema Neuro non-focal Skin warm and dry No muscular weakness    Assessment/Plan: 1. Systolic CHF: Patient had a reportedly normal echo in 2015.  In 10/19, echo showed EF down to 35-40%.  Rituximab was stopped.  Cardiolite was done in 2/20, showing EF 68% with no evidence for ischemia or infarction.  Echo March 2020 confirms normal EF > 65% ? Toxic effect from rituximab d/c Continue medical RX Given  edema will recheck echo make sure improvement has persisted  2. Microscopic polyangiitis: She is now on mycophenolate. And methotrexate shots. Drives a lot of her pain F/u [facility] may need Cytoxan Rx 3. Depression/?bipolar: Pressured speech, seems somewhat manic. Think she needs mental health followup.   Followup in a year   Jenkins Rouge 09/26/2018

## 2018-09-26 ENCOUNTER — Ambulatory Visit: Payer: Commercial Managed Care - PPO | Admitting: Cardiovascular Disease

## 2018-09-26 ENCOUNTER — Encounter: Payer: Self-pay | Admitting: Cardiovascular Disease

## 2018-09-26 ENCOUNTER — Other Ambulatory Visit: Payer: Self-pay

## 2018-09-26 VITALS — BP 132/66 | HR 98 | Ht 68.5 in | Wt 378.0 lb

## 2018-09-26 DIAGNOSIS — I5020 Unspecified systolic (congestive) heart failure: Secondary | ICD-10-CM | POA: Diagnosis not present

## 2018-09-26 DIAGNOSIS — I428 Other cardiomyopathies: Secondary | ICD-10-CM | POA: Diagnosis not present

## 2018-09-26 MED ORDER — FUROSEMIDE 20 MG PO TABS: 20.0000 mg | ORAL_TABLET | Freq: Every day | ORAL | 3 refills | Status: DC | PRN

## 2018-09-26 NOTE — Patient Instructions (Addendum)
Medication Instructions:  Your physician has recommended you make the following change in your medication:  1-Furosamide (Lasix) 20 mg by mouth daily as needed for swelling.   If you need a refill on your cardiac medications before your next appointment, please call your pharmacy.   Lab work:  If you have labs (blood work) drawn today and your tests are completely normal, you will receive your results only by: Marland Kitchen MyChart Message (if you have MyChart) OR . A paper copy in the mail If you have any lab test that is abnormal or we need to change your treatment, we will call you to review the results.  Testing/Procedures: Your physician has requested that you have an echocardiogram. Echocardiography is a painless test that uses sound waves to create images of your heart. It provides your doctor with information about the size and shape of your heart and how well your heart's chambers and valves are working. This procedure takes approximately one hour. There are no restrictions for this procedure.  Follow-Up: At [facility], you and your health needs are our priority.  As part of our continuing mission to provide you with exceptional heart care, we have created designated Provider Care Teams.  These Care Teams include your primary Cardiologist (physician) and Advanced Practice Providers (APPs -  Physician Assistants and Nurse Practitioners) who all work together to provide you with the care you need, when you need it. You will need a follow up appointment in 12 months.  Please call our office 2 months in advance to schedule this appointment.  You may see Jenkins Rouge, MD or one of the following Advanced Practice Providers on your designated Care Team:   Truitt Merle, NP Cecilie Kicks, NP . Kathyrn Drown, NP

## 2018-10-11 ENCOUNTER — Other Ambulatory Visit: Payer: Self-pay

## 2018-10-11 ENCOUNTER — Ambulatory Visit (HOSPITAL_COMMUNITY): Payer: Commercial Managed Care - PPO | Attending: Cardiovascular Disease

## 2018-10-11 DIAGNOSIS — I428 Other cardiomyopathies: Secondary | ICD-10-CM | POA: Diagnosis present

## 2018-10-11 MED ORDER — PERFLUTREN LIPID MICROSPHERE
1.0000 mL | INTRAVENOUS | Status: AC | PRN
  Administered 2018-10-11: 2 mL via INTRAVENOUS

## 2018-10-15 ENCOUNTER — Telehealth: Payer: Self-pay | Admitting: Cardiovascular Disease

## 2018-10-15 NOTE — Telephone Encounter (Signed)
New message   Patient states that she is returning call for echo results. Please call.

## 2018-10-15 NOTE — Telephone Encounter (Signed)
Patient aware of results. Per Dr. Johnsie Cancel, EF normal no significant valve disease. Patient verbalized understanding.

## 2018-11-04 ENCOUNTER — Other Ambulatory Visit: Payer: Self-pay | Admitting: Cardiovascular Disease

## 2018-11-04 DIAGNOSIS — R0602 Shortness of breath: Secondary | ICD-10-CM

## 2018-11-04 DIAGNOSIS — R9430 Abnormal result of cardiovascular function study, unspecified: Secondary | ICD-10-CM

## 2019-02-26 ENCOUNTER — Other Ambulatory Visit: Payer: Self-pay | Admitting: Cardiovascular Disease

## 2019-05-14 ENCOUNTER — Ambulatory Visit: Payer: Commercial Managed Care - PPO | Admitting: Internal Medicine

## 2019-05-15 ENCOUNTER — Telehealth: Payer: Self-pay

## 2019-05-15 NOTE — Telephone Encounter (Signed)
NOTES ON FILE FROM [facility] [phone], SENT REFERRAL TO SCHEDULING

## 2019-05-21 ENCOUNTER — Telehealth: Payer: Self-pay | Admitting: *Deleted

## 2019-05-21 ENCOUNTER — Other Ambulatory Visit: Payer: Self-pay

## 2019-05-21 ENCOUNTER — Ambulatory Visit: Payer: Commercial Managed Care - PPO | Admitting: Physician Assistant

## 2019-05-21 ENCOUNTER — Encounter: Payer: Self-pay | Admitting: Physician Assistant

## 2019-05-21 VITALS — BP 108/66 | HR 73 | Ht 68.5 in | Wt 385.0 lb

## 2019-05-21 DIAGNOSIS — I5022 Chronic systolic (congestive) heart failure: Secondary | ICD-10-CM

## 2019-05-21 DIAGNOSIS — I5020 Unspecified systolic (congestive) heart failure: Secondary | ICD-10-CM

## 2019-05-21 DIAGNOSIS — Z79899 Other long term (current) drug therapy: Secondary | ICD-10-CM

## 2019-05-21 DIAGNOSIS — I428 Other cardiomyopathies: Secondary | ICD-10-CM

## 2019-05-21 DIAGNOSIS — R0602 Shortness of breath: Secondary | ICD-10-CM

## 2019-05-21 NOTE — Telephone Encounter (Signed)
   [facility] Pre-operative Risk Assessment    Request for surgical clearance:  1. What type of surgery is being performed?  HYSTEROSCOPY / DNC   2. When is this surgery scheduled? 06/03/2019   3. What type of clearance is required (medical clearance vs. Pharmacy clearance to hold med vs. Both)?  MEDICAL  4. Are there any medications that need to be held prior to surgery and how long? N/A    5. Practice name and name of physician performing surgery?  [facility] // DR. Gaetano Net   6. What is your office phone number [phone]    7.   What is your office fax number [phone]   8.   Anesthesia type (None, local, MAC, general) ?  IV / LOCAL / PROPOFOL    [name] 05/21/2019, 11:56 AM  _________________________________________________________________   (provider comments below)

## 2019-05-21 NOTE — Progress Notes (Signed)
Cardiology Office Note    Date:  05/21/2019   ID:  [name], DOB [birth year], MRN [id number]  PCP:  Christain Sacramento, MD  Cardiologist:  Dr. Johnsie Cancel   Chief Complaint: Surgical clearance for D&C by Dr. Gaetano Net  History of Present Illness:   [name] is a 59 y.o. female with history of chemo induced cardiomyopathy, microscopic polyangiitis, morbid obesity, DM, anxiety and depression presents for surgical clearance.  Patient had reportedly normal echocardiogram in 2015. Patient had no known cardiac disease prior to the fall of 2019. 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 Due to exertional dyspnea, an echo was done in October 2019 which showed EF function down to 35 to 40%.  Her Rituximab stopped and mycophenolate started. Stress test 02/2018 without ischemia or infraction with EF of 68%. Echo 03/2018 showed LVEF > 65%. Seen by Dr. Johnsie Cancel 09/2018. She was doing well. Repeat echo 10/2018 showed LVEF of 65-70%.   She is on acetazolamide for the intracranial hypertension. Has chronic dyspnea 2nd to obesity and deconditioning. Microscopic polyangiitis followed at [facility].   Here today for follow-up.  She is trying to lose weight and do exercise.  She is walking.  Planning to join gym.  Denies chest pain, dizziness, orthopnea, PND, syncope, lower extremity edema or melena.  She reports chronic shortness of breath with activity.    Past Medical History:  Diagnosis Date  . Acute blood loss anemia 06/23/2013  . 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[place])   . 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[place]) 06/24/2013  . Anxiety associated with depression 09/23/2012  . At high risk for falls 09/23/2012   She has had a fractured ankle and tendon tear with falls over the last couple years.  . Cardiomyopathy ([place])   . Chronic fatigue 05/11/2014  . Chronic right-sided low back pain with right-sided sciatica 12/26/2016  . Colitis   . Constipation, chronic 09/27/2012  . 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 Diverticulitis of large intestine with perforation 09/23/2012  . Dyspnea 05/11/2014  . GERD (gastroesophageal reflux disease)   . Hemoptysis 06/23/2013  . History of endometriosis 1997  . Hx of tobacco use, presenting hazards to health 09/23/2012  . Hyperglycemia 07/24/2013  . Hypokalemia 07/20/2013  . ILD (interstitial lung disease) ([place]) 01/22/2014  . Leukocytosis 05/11/2014  . MPA (microscopic polyangiitis) ([place]) 2015  . Numbness   . Obesity (BMI 30-39.9) 09/23/2012  . Paresthesia 12/26/2016  . Perforation of sigmoid colon - stercoral 09/27/2012  . Physical deconditioning 04/30/2015  . 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  . Respiratory failure with hypoxia ([place]) 06/23/2013  . Right leg swelling 12/26/2016    Past Surgical History:  Procedure Laterality Date  . APPENDECTOMY    . BLADDER REPAIR    . LAPAROSCOPY  1997   dx of endometriosis  . LAPAROSCOPY  04/28/2000    Laparoscopy with lysis of adhesions, hysteroscopy, D&C.  Marland Kitchen TONSILLECTOMY      Current Medications: Prior to Admission medications   Medication Sig Start Date End Date Taking? Authorizing Provider  acetaZOLAMIDE (DIAMOX) 250 MG tablet Take 1 tablet (250 mg total) by mouth 3 (three) times daily. 12/20/17   Marcial Pacas, MD  ALPRAZolam Duanne Moron) 1 MG tablet Take 1 tablet (1 mg total) by mouth 3 (three) times daily as needed for anxiety. 08/22/13   Blanchie Serve, MD  buPROPion (WELLBUTRIN XL) 150 MG 24 hr tablet Take 450 mg by mouth daily.    [provider]  carvedilol (  COREG) 12.5 MG tablet TAKE 1 TABLET BY MOUTH TWICE A DAY 02/26/19   Josue Hector, MD  citalopram (CELEXA) 40 MG tablet Take 40 mg by mouth daily.    [provider]  dextromethorphan (DELSYM) 30 MG/5ML liquid Take by mouth as needed for cough.    [provider]  ENTRESTO 49-51 MG TAKE 1 TABLET BY MOUTH TWICE A DAY 02/26/19   Josue Hector, MD  esomeprazole (NEXIUM) 40 MG capsule Take 40 mg by  mouth daily at 12 noon.    [provider]  Fluticasone-Salmeterol (ADVAIR) 100-50 MCG/DOSE AEPB Inhale 1 puff into the lungs 2 (two) times daily.    [provider]  furosemide (LASIX) 20 MG tablet Take 1 tablet (20 mg total) by mouth daily as needed for edema. 09/26/18   Josue Hector, MD  metFORMIN (GLUCOPHAGE-XR) 500 MG 24 hr tablet Take 500 mg by mouth daily with breakfast.  10/03/17   [provider]  methotrexate (50 MG/ML) 1 g injection Inject into the vein once.    [provider]  Multiple Vitamin (MULTIVITAMIN) tablet Take 1 tablet by mouth daily.    [provider]  mycophenolate (CELLCEPT) 500 MG tablet Take 500 mg by mouth 3 (three) times daily.  06/05/17   [provider]  phentermine (ADIPEX-P) 37.5 MG tablet Take 37.5 mg by mouth daily. 04/06/18   [provider]  polyethylene glycol (MIRALAX / GLYCOLAX) packet Take 17 g by mouth at bedtime.     [provider]  prednisoLONE Sodium Phosphate (PREDNISOL OP) Apply 1 drop to eye 2 (two) times daily.    [provider]    Allergies:   Guaifenesin, Mucinex [guaifenesin er], Sulfa antibiotics, and Sulfamethoxazole   Social History   Socioeconomic History  . Marital status: Married    Spouse name: Not on file  . Number of children: 0  . Years of education: 38  . Highest education level: High school graduate  Occupational History  . Occupation: Disabled  Tobacco Use  . Smoking status: Former Smoker    Packs/day: 1.00    Years: 27.00    Pack years: 27.00    Quit date: 01/09/2001    Years since quitting: 18.3  . Smokeless tobacco: Never Used  Substance and Sexual Activity  . Alcohol use: No  . Drug use: No  . Sexual activity: Not on file  Other Topics Concern  . Not on file  Social History Narrative   Lives at home with her husband.   Right-handed.   1 cup caffeine per day, occasionally more.   Social Determinants of Health   Financial  Resource Strain:   . Difficulty of Paying Living Expenses:   Food Insecurity:   . Worried About Charity fundraiser in the Last Year:   . Arboriculturist in the Last Year:   Transportation Needs:   . Film/video editor (Medical):   Marland Kitchen Lack of Transportation (Non-Medical):   Physical Activity:   . Days of Exercise per Week:   . Minutes of Exercise per Session:   Stress:   . Feeling of Stress :   Social Connections:   . Frequency of Communication with Friends and Family:   . Frequency of Social Gatherings with Friends and Family:   . Attends Religious Services:   . Active Member of Clubs or Organizations:   . Attends Archivist Meetings:   Marland Kitchen Marital Status:  Family History:  The patient's family history includes Birth defects in her cousin; Breast cancer in her paternal grandmother; CAD in an other family member; Diabetes in her father and mother; Hypertension in her father; Obesity in her father.   ROS:   Please see the history of present illness.    ROS All other systems reviewed and are negative.   PHYSICAL EXAM:   VS:  BP 108/66   Pulse 73   Ht 5' 8.5" (1.74 m)   Wt (!) 385 lb (174.6 kg)   SpO2 97%   BMI 57.69 kg/m    GEN: Obese female in no acute distress  HEENT: normal  Neck: no JVD, carotid bruits, or masses Cardiac: RRR; no murmurs, rubs, or gallops,no edema  Respiratory:  clear to auscultation bilaterally, normal work of breathing GI: soft, nontender, nondistended, + BS MS: no deformity or atrophy  Skin: warm and dry, no rash Neuro:  Alert and Oriented x 3, Strength and sensation are intact Psych: euthymic mood, full affect  Wt Readings from Last 3 Encounters:  05/21/19 (!) 385 lb (174.6 kg)  09/26/18 (!) 378 lb (171.5 kg)  03/15/18 (!) 372 lb 12.8 oz (169.1 kg)      Studies/Labs Reviewed:   EKG:  EKG is ordered today.  The ekg ordered today demonstrates normal sinus rhythm at rate of 73 bpm  Recent Labs: No results found for  requested labs within last 8760 hours.   Lipid Panel    Component Value Date/Time   TRIG 159 (H) 09/30/2012 0545    Additional studies/ records that were reviewed today include:   Echocardiogram: 10/2018 1. Left ventricular ejection fraction, by visual estimation, is 65 to  70%. The left ventricle has normal function. Normal left ventricular size.  There is no left ventricular hypertrophy.  2. Definity contrast agent was given IV to delineate the left ventricular  endocardial borders.  3. Global right ventricle has normal systolic function.The right  ventricular size is normal. No increase in right ventricular wall  thickness.  4. Left atrial size was normal.  5. Right atrial size was normal.  6. The mitral valve is normal in structure. Trace mitral valve  regurgitation.  7. The tricuspid valve is normal in structure. Tricuspid valve  regurgitation was not visualized by color flow Doppler.  8. The aortic valve is normal in structure. Aortic valve regurgitation  was not visualized by color flow Doppler.  9. The pulmonic valve was normal in structure. Pulmonic valve  regurgitation is not visualized by color flow Doppler.  10. The inferior vena cava is normal in size with <50% respiratory  variability, suggesting right atrial pressure of 8 mmHg.  11. The left ventricular function has unchanged.    Stress test 02/2018  Nuclear stress EF: 68%.  There was no ST segment deviation noted during stress.  The study is normal.  This is a low risk study.  The left ventricular ejection fraction is hyperdynamic (>65%).   Normal stress nuclear study with no ischemia or infarction.  Gated ejection fraction 68% with normal wall motion.   ASSESSMENT & PLAN:   1.  Chemo induced cardiomyopathy/NICM -Heart LV function normalized after discontinuation Rituximab.  -Euvolemic - Low risk stress test 02/2018 - Last echo 09/2018 with LVEF of 65-70% - Continue BB and Entresto -  Continue Lasix  2. Surgical clearance - she is chronic DOE due to obesity. Her Dyspnea has been improving gradually since trying to be more active. Able to achieve  5.74 METs of activity. Given past medical history and time since last visit, based on ACC/AHA guidelines, [name] would be at acceptable risk for the planned procedure without further cardiovascular testing.   I will route this recommendation to the requesting party via Epic fax function and remove from pre-op pool.  Please call with questions.  [place], [place] 05/21/2019, 2:45 PM  3. DM - Per PCP  4. Anxiety - Has baseline anxiety   5. Microscopic polyangiitis - Followed at [facility]   Medication Adjustments/Labs and Tests Ordered: Current medicines are reviewed at length with the patient today.  Concerns regarding medicines are outlined above.  Medication changes, Labs and Tests ordered today are listed in the Patient Instructions below. Patient Instructions  Medication Instructions:  Your physician recommends that you continue on your current medications as directed. Please refer to the Current Medication list given to you today.  *If you need a refill on your cardiac medications before your next appointment, please call your pharmacy*   Lab Work: None ordered  If you have labs (blood work) drawn today and your tests are completely normal, you will receive your results only by: Marland Kitchen MyChart Message (if you have MyChart) OR . A paper copy in the mail If you have any lab test that is abnormal or we need to change your treatment, we will call you to review the results.   Testing/Procedures: None ordered   Follow-Up: At [facility], you and your health needs are our priority.  As part of our continuing mission to provide you with exceptional heart care, we have created designated Provider Care Teams.  These Care Teams include your primary Cardiologist (physician) and Advanced Practice Providers (APPs -   Physician Assistants and Nurse Practitioners) who all work together to provide you with the care you need, when you need it.  We recommend signing up for the patient portal called "MyChart".  Sign up information is provided on this After Visit Summary.  MyChart is used to connect with patients for Virtual Visits (Telemedicine).  Patients are able to view lab/test results, encounter notes, upcoming appointments, etc.  Non-urgent messages can be sent to your provider as well.   To learn more about what you can do with MyChart, go to [email or web address].    Your next appointment:   12 month(s)  The format for your next appointment:   In Person  Provider:   You may see Jenkins Rouge, MD or one of the following Advanced Practice Providers on your designated Care Team:    Truitt Merle, NP  Cecilie Kicks, NP  Kathyrn Drown, NP    Other Instructions      Signed, Leanor Kail, [place]  05/21/2019 2:45 PM    [facility] [address], [place], [place]  [address] Phone: [phone]; Fax: [phone]

## 2019-05-21 NOTE — Patient Instructions (Addendum)
Medication Instructions:  Your physician recommends that you continue on your current medications as directed. Please refer to the Current Medication list given to you today.  *If you need a refill on your cardiac medications before your next appointment, please call your pharmacy*   Lab Work: None ordered  If you have labs (blood work) drawn today and your tests are completely normal, you will receive your results only by: Marland Kitchen MyChart Message (if you have MyChart) OR . A paper copy in the mail If you have any lab test that is abnormal or we need to change your treatment, we will call you to review the results.   Testing/Procedures: None ordered   Follow-Up: At [facility], you and your health needs are our priority.  As part of our continuing mission to provide you with exceptional heart care, we have created designated Provider Care Teams.  These Care Teams include your primary Cardiologist (physician) and Advanced Practice Providers (APPs -  Physician Assistants and Nurse Practitioners) who all work together to provide you with the care you need, when you need it.  We recommend signing up for the patient portal called "MyChart".  Sign up information is provided on this After Visit Summary.  MyChart is used to connect with patients for Virtual Visits (Telemedicine).  Patients are able to view lab/test results, encounter notes, upcoming appointments, etc.  Non-urgent messages can be sent to your provider as well.   To learn more about what you can do with MyChart, go to [email or web address].    Your next appointment:   12 month(s)  The format for your next appointment:   In Person  Provider:   You may see Jenkins Rouge, MD or one of the following Advanced Practice Providers on your designated Care Team:    Truitt Merle, NP  Cecilie Kicks, NP  Kathyrn Drown, NP    Other Instructions

## 2019-05-23 ENCOUNTER — Ambulatory Visit: Payer: Commercial Managed Care - PPO | Admitting: Adult Health

## 2019-05-23 ENCOUNTER — Encounter: Payer: Self-pay | Admitting: Adult Health

## 2019-05-23 ENCOUNTER — Ambulatory Visit (INDEPENDENT_AMBULATORY_CARE_PROVIDER_SITE_OTHER)
Admission: RE | Admit: 2019-05-23 | Discharge: 2019-05-23 | Disposition: A | Discharge status: Home / Self Care | Payer: Commercial Managed Care - PPO | Source: Ambulatory Visit | Attending: Adult Health | Admitting: Adult Health

## 2019-05-23 ENCOUNTER — Other Ambulatory Visit: Payer: Self-pay

## 2019-05-23 VITALS — BP 108/64 | HR 70 | Temp 98.0°F | Ht 68.5 in | Wt 384.6 lb

## 2019-05-23 DIAGNOSIS — R0600 Dyspnea, unspecified: Secondary | ICD-10-CM

## 2019-05-23 DIAGNOSIS — I7782 Antineutrophilic cytoplasmic antibody (ANCA) vasculitis: Secondary | ICD-10-CM

## 2019-05-23 DIAGNOSIS — J849 Interstitial pulmonary disease, unspecified: Secondary | ICD-10-CM

## 2019-05-23 DIAGNOSIS — I776 Arteritis, unspecified: Secondary | ICD-10-CM

## 2019-05-23 DIAGNOSIS — J9691 Respiratory failure, unspecified with hypoxia: Secondary | ICD-10-CM

## 2019-05-23 DIAGNOSIS — Z01818 Encounter for other preprocedural examination: Secondary | ICD-10-CM | POA: Insufficient documentation

## 2019-05-23 DIAGNOSIS — E669 Obesity, unspecified: Secondary | ICD-10-CM

## 2019-05-23 NOTE — Assessment & Plan Note (Signed)
No evidence on high-resolution CT chest 2019 or 2020 check spirometry with DLCO on return

## 2019-05-23 NOTE — Patient Instructions (Addendum)
Chest xray today .  Activity as tolerated .  Work on healthy weight loss.  Emmit Alexanders with upcoming surgery.  Follow up with Dr. Hazle Coca NP - 3-4 months with PFT -Spirometry with DLCO  Please contact office for sooner follow up if symptoms do not improve or worsen or seek emergency care

## 2019-05-23 NOTE — Progress Notes (Signed)
@Patient  ID: [name], female    DOB: 1960-09-01, 59 y.o.   MRN: [id number]  Chief Complaint  Patient presents with  . 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 Posey Pronto Followed by Dr. Kathlene November in rheumatology Followed by [facility] pulmonology Dr. Pricilla Larsson.  TEST/EVENTS :  2015 CT chest diffuse bilateral airspace opacities Serology P ANCA 1: 640, mild low peroxidase greater than 8 Bacterial and viral cultures negative Tx w/  Imuran July 2015 High-resolution CT chest April 2019 showed no evidence of interstitial lung disease CT chest July 2020 no evidence of fibrotic interstitial lung disease   05/23/2019 Follow up : ANCA associated vasculitis Patient returns for a follow-up visit.  She was last seen in 2019.  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  She was seen by rheumatology initially started on Rituxan and steroids.  Unfortunately patient had cardiomyopathy associated to her Rituxan and this was stopped in 2019.  She has been seen at [facility] rheumatology and pulmonology.  She is now on CellCept 1500 mg twice daily and methotrexate.  She is followed by cardiology and is on Coreg and Entresto.  Last 2D echo showed EF 65 to 70% normal right ventricle systolic function, left and right atrial normal size.  She says overall her breathing is doing okay she does get short of breath with  activity and has low activity tolerance.  She is on Advair twice daily denies any wheezing.  Does have a dry intermittent cough but describes it as mild. She is on Advair twice daily Stable pulmonary function testing 2020FEV1 63%, ratio 77, FVC 61%, DLCO 92% (BMI 57) She says that she is active and independent.  She is able to drive.  Does her light housework.  She is no longer using oxygen for several years.  She has received both of her Covid vaccines. She denies any chest pain hemoptysis abdominal pain nausea vomiting diarrhea or increased leg swelling.  She has been followed by gynecology for dysfunctional bleeding with postmenopausal bleeding.  She is requiring a D&C procedure to evaluate this bleeding.  She is requesting a pulmonary preop clearance today.  Patient has no formal diagnosis of sleep apnea.  She has never had a sleep study.  She says she has some teeth grinding and is reported some mild snoring in the past.  She is not on oxygen at nighttime.  Walk test in the office shows O2 saturations at 97% walking on room air.  O2 saturation at rest is 100%.   Allergies  Allergen Reactions  . Guaifenesin Nausea And Vomiting and Other (See Comments)    un  . Mucinex [Guaifenesin Er] Nausea And Vomiting  . Sulfa Antibiotics Nausea And Vomiting    Immunization History  Administered Date(s) Administered  . Influenza Split 10/09/2016  . Influenza,inj,Quad PF,6+ Mos 11/03/2015  . PFIZER SARS-COV-2 Vaccination 03/27/2019, 04/17/2019  . Tdap 03/29/2012    Past Medical History:  Diagnosis Date  . Acute blood loss anemia 06/23/2013  .  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[place])   . 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[place]) 06/24/2013  . Anxiety associated with depression 09/23/2012  . At high risk for falls 09/23/2012   She has had a fractured ankle and tendon tear with falls over the last couple years.  . Cardiomyopathy ([place])   . Chronic fatigue 05/11/2014  . Chronic right-sided low back pain with  right-sided sciatica 12/26/2016  . Colitis   . Constipation, chronic 09/27/2012  . 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 Diverticulitis of large intestine with perforation 09/23/2012  . Dyspnea 05/11/2014  . GERD (gastroesophageal reflux disease)   . Hemoptysis 06/23/2013  . History of endometriosis 1997  . Hx of tobacco use, presenting hazards to health 09/23/2012  . Hyperglycemia 07/24/2013  . Hypokalemia 07/20/2013  . ILD (interstitial lung disease) ([place]) 01/22/2014  . Leukocytosis 05/11/2014  . MPA (microscopic polyangiitis) ([place]) 2015  . Numbness   . Obesity (BMI 30-39.9) 09/23/2012  . Paresthesia 12/26/2016  . Perforation of sigmoid colon - stercoral 09/27/2012  . Physical deconditioning 04/30/2015  . 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  . Respiratory failure with hypoxia ([place]) 06/23/2013  . Right leg swelling 12/26/2016    Tobacco History: Social History   Tobacco Use  Smoking Status Former Smoker  . Packs/day: 1.00  . Years: 27.00  . Pack years: 27.00  . Quit date: 01/09/2001  . Years since quitting: 18.3  Smokeless Tobacco Never Used   Counseling given: Not Answered   Outpatient Medications Prior to Visit  Medication Sig Dispense Refill  . ALPRAZolam (XANAX) 1 MG tablet Take 1 tablet (1 mg total) by mouth 3 (three) times daily as needed for anxiety. 90 tablet 0  . buPROPion (WELLBUTRIN XL) 150 MG 24 hr tablet Take 450 mg by mouth daily.    . carvedilol (COREG) 12.5 MG tablet TAKE 1 TABLET BY MOUTH TWICE A DAY (Patient taking differently: Take 12.5 mg by mouth 2 (two) times daily with a meal. ) 180 tablet 1  . citalopram (CELEXA) 40 MG tablet Take 40 mg by mouth daily.    . clobetasol cream (TEMOVATE) 3.15 % Apply 1 application topically 2 (two) times daily as needed (paronychia).     Marland Kitchen dextromethorphan (DELSYM) 30 MG/5ML liquid Take 15 mg by mouth 2 (two) times daily as needed for cough.     Marland Kitchen ENTRESTO 49-51 MG TAKE 1 TABLET BY MOUTH TWICE A DAY (Patient  taking differently: Take 1 tablet by mouth 2 (two) times daily. ) 180 tablet 1  . esomeprazole (NEXIUM) 20 MG capsule Take 40 mg by mouth daily.     . Fluticasone-Salmeterol (ADVAIR) 100-50 MCG/DOSE AEPB Inhale 1 puff into the lungs 2 (two) times daily as needed (asthma).     . folic acid (FOLVITE) 1 MG tablet Take 1 mg by mouth daily.     Marland Kitchen gabapentin (NEURONTIN) 400 MG capsule Take 400 mg by mouth at bedtime as needed (pain).     . meloxicam (MOBIC) 15 MG tablet Take 15 mg by mouth daily.     . metFORMIN (GLUCOPHAGE-XR) 500 MG 24 hr tablet Take 500 mg by mouth daily with breakfast.     . methotrexate (50 MG/ML) 1 g injection Inject into the vein once a week.     . Multiple Vitamin (MULTIVITAMIN) tablet Take 2 tablets by mouth daily.     . mycophenolate (CELLCEPT) 500 MG tablet Take 1,500 mg by mouth 2 (two) times daily.     . phentermine (ADIPEX-P) 37.5 MG tablet Take 37.5  mg by mouth daily.    . polyethylene glycol (MIRALAX / GLYCOLAX) packet Take 17 g by mouth daily as needed for moderate constipation (constipation).      No facility-administered medications prior to visit.     Review of Systems:   Constitutional:   No  weight loss, night sweats,  Fevers, chills, + fatigue, or  lassitude.  HEENT:   No headaches,  Difficulty swallowing,  Tooth/dental problems, or  Sore throat,                No sneezing, itching, ear ache, nasal congestion, post nasal drip,   CV:  No chest pain,  Orthopnea, PND, +swelling in lower extremities, no anasarca, dizziness, palpitations, syncope.   GI  No heartburn, indigestion, abdominal pain, nausea, vomiting, diarrhea, change in bowel habits, loss of appetite, bloody stools.   Resp:   No chest wall deformity  Skin: no rash or lesions.  GU: no dysuria, change in color of urine, no urgency or frequency.  No flank pain, no hematuria   MS:  No joint pain or swelling.  No decreased range of motion.  No back pain.    Physical Exam  BP 108/64 (Cuff  Size: Large)   Pulse 70   Temp 98 F (36.7 C) (Temporal)   Ht 5' 8.5" (1.74 m)   Wt (!) 384 lb 9.6 oz (174.5 kg)   SpO2 97%   BMI 57.63 kg/m   GEN: A/Ox3; pleasant , NAD, BMI 57   HEENT:  [place]/AT,   NOSE-clear, THROAT-clear, no lesions, no postnasal drip or exudate noted.   NECK:  Supple w/ fair ROM; no JVD; normal carotid impulses w/o bruits; no thyromegaly or nodules palpated; no lymphadenopathy.    RESP  Clear  P & A; w/o, wheezes/ rales/ or rhonchi. no accessory muscle use, no dullness to percussion  CARD:  RRR, no m/r/g, tr peripheral edema, pulses intact, no cyanosis or clubbing.  GI:   Soft & nt; nml bowel sounds; no organomegaly or masses detected.   Musco: Warm bil, no deformities or joint swelling noted.   Neuro: alert, no focal deficits noted.    Skin: Warm, no lesions or rashes    Lab Results:  CBC  BMET  BNP  Imaging: No results found.    PFT Results Latest Ref Rng & Units 01/22/2014  FVC-Pre L 2.12  FVC-Predicted Pre % 53  FVC-Post L 1.91  FVC-Predicted Post % 48  Pre FEV1/FVC % % 86  Post FEV1/FCV % % 91  FEV1-Pre L 1.82  FEV1-Predicted Pre % 58  FEV1-Post L 1.73  DLCO UNC% % 64  DLCO COR %Predicted % 96    Lab Results  Component Value Date   NITRICOXIDE 7 12/09/2015        Assessment & Plan:   ANCA-associated vasculitis Appears to be currently stable on present regimen.  She is continue follow-up with rheumatology. Pulmonary function testing done 2020 showed stable lung function and normal DLCO.  Suspect her restrictive lung disease could be secondary to her morbid obesity with a BMI at 75.  Have recommended that later this year she return for spirometry with DLCO.  High-resolution CT chest in 2019 and 2020 shows no evidence of interstitial lung disease initial bilateral opacities that occurred in 2015 have resolved.  She does have some very mild minimal bibasilar scarring/atelectasis other than that no evidence of interstitial lung  disease.   Respiratory failure with hypoxia Has resolved.  She is no longer  on oxygen.  Walk test in the office shows no desaturations and good O2 saturations at rest and with ambulation.  Check chest x-ray today for preop clearance screening  ILD (interstitial lung disease) No evidence on high-resolution CT chest 2019 or 2020 check spirometry with DLCO on return  Obesity (BMI 30-39.9) Patient has severe morbid obesity with a BMI at 57.  Patient does have risk factors for possible underlying sleep apnea she has never had a formal sleep evaluation she does have some mild teeth grinding and snoring.  With her multiple comorbidities have discussed with her on return visit that we can look at this in more detail and decide if she needs sleep study  Preoperative clearance Preop pulmonary surgical clearance for D&C by GYN. From pulmonary standpoint patient is a moderate to high surgical risk mainly secondary to her multiple comorbidities, BMI 57, chronic medications with immunosuppression.  However she would not be excluded from proceeding with surgery as she has significant postmenopausal bleeding that will need further investigation.  From a pulmonary standpoint she is independent and not on oxygen CT chest has not shown any involvement with interstitial lung disease from her vasculitis.  On CTs in 2019 and 2020.  As above she is no longer oxygen dependent.  And functional abilities are normal. With her multiple comorbidities would complete procedure in a tertiary hospital with full capabilities for pulmonary cardiology and multi specialty providers if needed    Major Pulmonary risks identified in the multifactorial risk analysis are but not limited to a) pneumonia; b) recurrent intubation risk; c) prolonged or recurrent acute respiratory failure needing mechanical ventilation; d) prolonged hospitalization; e) DVT/Pulmonary embolism; f) Acute Pulmonary edema  Recommend 1. Short duration of surgery  as much as possible and avoid paralytic if possible 2. Recovery in step down or ICU with Pulmonary consultation if indicated 3. DVT prophylaxis if indicated 4. Aggressive pulmonary toilet with o2, bronchodilatation, and incentive spirometry and early ambulation Mobility is key.  She does not have any formal diagnosis of sleep apnea however with her obesity could have a component of hypoventilation or even obstructive sleep apnea so if difficulties in the postop setting would consider CPAP or BiPAP if indicated.        [name], NP 05/23/2019

## 2019-05-23 NOTE — Assessment & Plan Note (Signed)
Appears to be currently stable on present regimen.  She is continue follow-up with rheumatology. Pulmonary function testing done 2020 showed stable lung function and normal DLCO.  Suspect her restrictive lung disease could be secondary to her morbid obesity with a BMI at 30.  Have recommended that later this year she return for spirometry with DLCO.  High-resolution CT chest in 2019 and 2020 shows no evidence of interstitial lung disease initial bilateral opacities that occurred in 2015 have resolved.  She does have some very mild minimal bibasilar scarring/atelectasis other than that no evidence of interstitial lung disease.

## 2019-05-23 NOTE — Assessment & Plan Note (Signed)
Patient has severe morbid obesity with a BMI at 57.  Patient does have risk factors for possible underlying sleep apnea she has never had a formal sleep evaluation she does have some mild teeth grinding and snoring.  With her multiple comorbidities have discussed with her on return visit that we can look at this in more detail and decide if she needs sleep study

## 2019-05-23 NOTE — Assessment & Plan Note (Signed)
Preop pulmonary surgical clearance for D&C by GYN. From pulmonary standpoint patient is a moderate to high surgical risk mainly secondary to her multiple comorbidities, BMI 57, chronic medications with immunosuppression.  However she would not be excluded from proceeding with surgery as she has significant postmenopausal bleeding that will need further investigation.  From a pulmonary standpoint she is independent and not on oxygen CT chest has not shown any involvement with interstitial lung disease from her vasculitis.  On CTs in 2019 and 2020.  As above she is no longer oxygen dependent.  And functional abilities are normal. With her multiple comorbidities would complete procedure in a tertiary hospital with full capabilities for pulmonary cardiology and multi specialty providers if needed    Major Pulmonary risks identified in the multifactorial risk analysis are but not limited to a) pneumonia; b) recurrent intubation risk; c) prolonged or recurrent acute respiratory failure needing mechanical ventilation; d) prolonged hospitalization; e) DVT/Pulmonary embolism; f) Acute Pulmonary edema  Recommend 1. Short duration of surgery as much as possible and avoid paralytic if possible 2. Recovery in step down or ICU with Pulmonary consultation if indicated 3. DVT prophylaxis if indicated 4. Aggressive pulmonary toilet with o2, bronchodilatation, and incentive spirometry and early ambulation Mobility is key.  She does not have any formal diagnosis of sleep apnea however with her obesity could have a component of hypoventilation or even obstructive sleep apnea so if difficulties in the postop setting would consider CPAP or BiPAP if indicated.

## 2019-05-23 NOTE — Assessment & Plan Note (Signed)
Has resolved.  She is no longer on oxygen.  Walk test in the office shows no desaturations and good O2 saturations at rest and with ambulation.  Check chest x-ray today for preop clearance screening

## 2019-05-29 ENCOUNTER — Encounter (HOSPITAL_COMMUNITY): Payer: Self-pay

## 2019-05-29 NOTE — Progress Notes (Addendum)
[id number] - [place], [place] - [address] [place] [address] Phone: [phone] Fax: [phone]      Your procedure is scheduled on Jun 03, 2019.  Report to [facility] Main Entrance "A" at 05:30 A.M., and check in at the Admitting office.  Call this number if you have problems the morning of surgery:  [phone]  Call [phone] if you have any questions prior to your surgery date Monday-Friday 8am-4pm    Remember:  Do not eat after midnight the night before your surgery  You may drink clear liquids until 04:15am the morning of your surgery.   Clear liquids allowed are: Water, Non-Citrus Juices (without pulp), Carbonated Beverages, Clear Tea, Black Coffee Only, and Gatorade    Take these medicines the morning of surgery with A SIP OF WATER : Alprazolam (Xanax) if needed Bupropion (Wellbutrin XL) Carvedilol (Coreg) Citalopram (Celexa) Esomeprazole (Nexium) Fluticasone-Salmeterol (Advair) if needed  As of today, STOP taking any Aspirin (unless otherwise instructed by your surgeon) and Aspirin containing products, Phentermine, Meloxicam (Mobic), Aleve, Naproxen, Ibuprofen, Motrin, Advil, Goody's, BC's, all herbal medications, fish oil, and all vitamins.   WHAT DO I DO ABOUT MY DIABETES MEDICATION?   Marland Kitchen Do not take oral diabetes medicines (pills) the morning of surgery. DO NOT take Metformin (Glucophage-XR) the morning of surgery.   HOW TO MANAGE YOUR DIABETES BEFORE AND AFTER SURGERY  Why is it important to control my blood sugar before and after surgery? . Improving blood sugar levels before and after surgery helps healing and can limit problems. . A way of improving blood sugar control is eating a healthy diet by: o  Eating less sugar and carbohydrates o  Increasing activity/exercise o  Talking with your doctor about reaching your blood sugar goals . High blood sugars (greater than 180  mg/dL) can raise your risk of infections and slow your recovery, so you will need to focus on controlling your diabetes during the weeks before surgery. . Make sure that the doctor who takes care of your diabetes knows about your planned surgery including the date and location.  How do I manage my blood sugar before surgery? . Check your blood sugar at least 4 times a day, starting 2 days before surgery, to make sure that the level is not too high or low. . Check your blood sugar the morning of your surgery when you wake up and every 2 hours until you get to the Short Stay unit. o If your blood sugar is less than 70 mg/dL, you will need to treat for low blood sugar: - Do not take insulin. - Treat a low blood sugar (less than 70 mg/dL) with  cup of clear juice (cranberry or apple), 4 glucose tablets, OR glucose gel. - Recheck blood sugar in 15 minutes after treatment (to make sure it is greater than 70 mg/dL). If your blood sugar is not greater than 70 mg/dL on recheck, call [phone] for further instructions. . Report your blood sugar to the short stay nurse when you get to Short Stay.  . If you are admitted to the hospital after surgery: o Your blood sugar will be checked by the staff and you will probably be given insulin after surgery (instead of oral diabetes medicines) to make sure you have good blood sugar levels. o The goal for blood sugar control after surgery is 80-180 mg/dL.  Do not wear jewelry, make up, or nail polish            Do not wear lotions, powders, perfumes/colognes, or deodorant.            Do not shave 48 hours prior to surgery.              Do not bring valuables to the hospital.            [facility] is not responsible for any belongings or valuables.  Do NOT Smoke (Tobacco/Vapping) or drink Alcohol 24 hours prior to your procedure If you use a CPAP at night, you may bring all equipment for your overnight stay.   Contacts, glasses,  dentures or bridgework may not be worn into surgery.      For patients admitted to the hospital, discharge time will be determined by your treatment team.   Patients discharged the day of surgery will not be allowed to drive home, and someone needs to stay with them for 24 hours.    Special instructions:   [facility]- Preparing For Surgery  Before surgery, you can play an important role. Because skin is not sterile, your skin needs to be as free of germs as possible. You can reduce the number of germs on your skin by washing with CHG (chlorahexidine gluconate) Soap before surgery.  CHG is an antiseptic cleaner which kills germs and bonds with the skin to continue killing germs even after washing.    Oral Hygiene is also important to reduce your risk of infection.  Remember - BRUSH YOUR TEETH THE MORNING OF SURGERY WITH YOUR REGULAR TOOTHPASTE  Please do not use if you have an allergy to CHG or antibacterial soaps. If your skin becomes reddened/irritated stop using the CHG.  Do not shave (including legs and underarms) for at least 48 hours prior to first CHG shower. It is OK to shave your face.  Please follow these instructions carefully.   1. Shower the NIGHT BEFORE SURGERY and the MORNING OF SURGERY with CHG Soap.   2. If you chose to wash your hair, wash your hair first as usual with your normal shampoo.  3. After you shampoo, rinse your hair and body thoroughly to remove the shampoo.  4. Use CHG as you would any other liquid soap. You can apply CHG directly to the skin and wash gently with a scrungie or a clean washcloth.   5. Apply the CHG Soap to your body ONLY FROM THE NECK DOWN.  Do not use on open wounds or open sores. Avoid contact with your eyes, ears, mouth and genitals (private parts). Wash Face and genitals (private parts)  with your normal soap.   6. Wash thoroughly, paying special attention to the area where your surgery will be performed.  7. Thoroughly rinse your  body with warm water from the neck down.  8. DO NOT shower/wash with your normal soap after using and rinsing off the CHG Soap.  9. Pat yourself dry with a CLEAN TOWEL.  10. Wear CLEAN PAJAMAS to bed the night before surgery, wear comfortable clothes the morning of surgery  11. Place CLEAN SHEETS on your bed the night of your first shower and DO NOT SLEEP WITH PETS.   Day of Surgery:   Do not apply any deodorants/lotions.  Please wear clean clothes to the hospital/surgery center.   Remember to brush your teeth WITH YOUR REGULAR TOOTHPASTE.   Please read over the following fact sheets  that you were given.

## 2019-05-30 ENCOUNTER — Other Ambulatory Visit (HOSPITAL_COMMUNITY)
Admission: RE | Admit: 2019-05-30 | Discharge: 2019-05-30 | Disposition: A | Discharge status: Home / Self Care | Payer: Commercial Managed Care - PPO | Source: Ambulatory Visit | Attending: Obstetrics and Gynecology | Admitting: Obstetrics and Gynecology

## 2019-05-30 ENCOUNTER — Other Ambulatory Visit: Payer: Self-pay

## 2019-05-30 ENCOUNTER — Encounter (HOSPITAL_COMMUNITY)
Admission: RE | Admit: 2019-05-30 | Discharge: 2019-05-30 | Disposition: A | Discharge status: Home / Self Care | Payer: Commercial Managed Care - PPO | Source: Ambulatory Visit | Attending: Obstetrics and Gynecology | Admitting: Obstetrics and Gynecology

## 2019-05-30 ENCOUNTER — Encounter (HOSPITAL_COMMUNITY): Payer: Self-pay

## 2019-05-30 DIAGNOSIS — Z01812 Encounter for preprocedural laboratory examination: Secondary | ICD-10-CM | POA: Diagnosis not present

## 2019-05-30 DIAGNOSIS — Z20822 Contact with and (suspected) exposure to covid-19: Secondary | ICD-10-CM | POA: Diagnosis not present

## 2019-05-30 HISTORY — DX: Type 2 diabetes mellitus without complications: E11.9

## 2019-05-30 HISTORY — DX: Essential (primary) hypertension: I10

## 2019-05-30 LAB — COMPREHENSIVE METABOLIC PANEL
ALT: 17 U/L (ref 0–44)
AST: 14 U/L — ABNORMAL LOW (ref 15–41)
Albumin: 3.6 g/dL (ref 3.5–5.0)
Alkaline Phosphatase: 100 U/L (ref 38–126)
Anion gap: 11 (ref 5–15)
BUN: 24 mg/dL — ABNORMAL HIGH (ref 6–20)
CO2: 23 mmol/L (ref 22–32)
Calcium: 9 mg/dL (ref 8.9–10.3)
Chloride: 107 mmol/L (ref 98–111)
Creatinine, Ser: 0.89 mg/dL (ref 0.44–1.00)
GFR calc Af Amer: >60 mL/min (ref >60)
GFR calc non Af Amer: >60 mL/min (ref >60)
Glucose, Bld: 148 mg/dL — ABNORMAL HIGH (ref 70–99)
Potassium: 4.7 mmol/L (ref 3.5–5.1)
Sodium: 141 mmol/L (ref 135–145)
Total Bilirubin: 0.7 mg/dL (ref 0.3–1.2)
Total Protein: 6.6 g/dL (ref 6.5–8.1)

## 2019-05-30 LAB — ABO/RH: ABO/RH(D): O POS

## 2019-05-30 LAB — TYPE AND SCREEN
ABO/RH(D): O POS
Antibody Screen: NEGATIVE

## 2019-05-30 LAB — CBC
HCT: 40 % (ref 36.0–46.0)
Hemoglobin: 12.3 g/dL (ref 12.0–15.0)
MCH: 26.5 pg (ref 26.0–34.0)
MCHC: 30.8 g/dL (ref 30.0–36.0)
MCV: 86.2 fL (ref 80.0–100.0)
Platelets: 288 10*3/uL (ref 150–400)
RBC: 4.64 MIL/uL (ref 3.87–5.11)
RDW: 14.5 % (ref 11.5–15.5)
WBC: 12.5 10*3/uL — ABNORMAL HIGH (ref 4.0–10.5)
nRBC: 0 % (ref 0.0–0.2)

## 2019-05-30 LAB — HEMOGLOBIN A1C
Hgb A1c MFr Bld: 7 % — ABNORMAL HIGH (ref 4.8–5.6)
Mean Plasma Glucose: 154.2 mg/dL

## 2019-05-30 LAB — SARS CORONAVIRUS 2 (TAT 6-24 HRS): SARS Coronavirus 2: NEGATIVE

## 2019-05-30 LAB — GLUCOSE, CAPILLARY: Glucose-Capillary: 136 mg/dL — ABNORMAL HIGH (ref 70–99)

## 2019-05-30 NOTE — Progress Notes (Signed)
PCP - Dr. [name] Cardiologist - Dr. Jenkins Rouge Rheumatology: Dr. Lahoma Rocker Pulmonology: Dr. Brand Males  PPM/ICD - Denies  Chest x-ray - 05/23/19 EKG - 05/21/19 Stress Test - 02/2018 ECHO - 10/2018 Cardiac Cath - Denies  Sleep Study -  Denies  Pt has hx of diabetes, but does not check her blood sugars.  Blood Thinner Instructions: N/A Aspirin Instructions: N/A  ERAS Protcol - Yes PRE-SURGERY Ensure or G2- No  COVID TEST- 05/30/19   Coronavirus Screening  Have you experienced the following symptoms:  Cough yes/no: No Fever (>100.72F)  yes/no: No Runny nose yes/no: No Sore throat yes/no: No Difficulty breathing/shortness of breath  yes/no: No  Have you or a family member traveled in the last 14 days and where? yes/no: No   If the patient indicates "YES" to the above questions, their PAT will be rescheduled to limit the exposure to others and, the surgeon will be notified. THE PATIENT WILL NEED TO BE ASYMPTOMATIC FOR 14 DAYS.   If the patient is not experiencing any of these symptoms, the PAT nurse will instruct them to NOT bring anyone with them to their appointment since they may have these symptoms or traveled as well.   Please remind your patients and families that hospital visitation restrictions are in effect and the importance of the restrictions.     Anesthesia review: Yes  Patient denies shortness of breath, fever, cough and chest pain at PAT appointment   All instructions explained to the patient, with a verbal understanding of the material. Patient agrees to go over the instructions while at home for a better understanding. Patient also instructed to self quarantine after being tested for COVID-19. The opportunity to ask questions was provided.

## 2019-06-02 MED ORDER — DEXTROSE 5 % IV SOLN
3.0000 g | INTRAVENOUS | Status: AC
  Administered 2019-06-03: 3 g via INTRAVENOUS
  Filled 2019-06-02: qty 3000
  Filled 2019-06-02: qty 3

## 2019-06-02 NOTE — Anesthesia Preprocedure Evaluation (Addendum)
Anesthesia Evaluation  Patient identified by MRN, date of birth, ID band Patient awake    Reviewed: Allergy & Precautions, NPO status , Patient's Chart, lab work & pertinent test results  History of Anesthesia Complications Negative for: history of anesthetic complications  Airway Mallampati: II  TM Distance: >3 FB Neck ROM: Full    Dental  (+) Poor Dentition, Missing, Chipped   Pulmonary shortness of breath, former smoker,  Interstitial lung disease   Pulmonary exam normal        Cardiovascular hypertension, Pt. on medications and Pt. on home beta blockers +CHF (NICM 2/2 rituximab; EF recovered)  Normal cardiovascular exam     Neuro/Psych Anxiety Depression negative neurological ROS     GI/Hepatic Neg liver ROS, GERD  ,  Endo/Other  diabetes, Oral Hypoglycemic AgentsMorbid obesity  Renal/GU negative Renal ROS  negative genitourinary   Musculoskeletal negative musculoskeletal ROS (+)   Abdominal (+) + obese,   Peds  Hematology negative hematology ROS (+)   Anesthesia Other Findings  ANCA vasculitis  [name] Edison, NP with pulmonology cleared for surgery 05/23/19 at moderate to high risk due to comorbidities, BMI, and immunosuppressive meds. Recommends procedure be done in a tertiary setting with pulmonary cardiology and multispecialty providers available.  [name] notes: "Major Pulmonary risks identified in the multifactorial risk analysis are but not limited to a) pneumonia; b) recurrent intubation risk; c) prolonged or recurrent acute respiratory failure needing mechanical ventilation; d) prolonged hospitalization; e) DVT/Pulmonary embolism; f) Acute Pulmonary edema  Recommend 1. Short duration of surgery as much as possible and avoid paralytic if possible 2. Recovery in step down or ICU with Pulmonary consultationif indicated 3. DVT prophylaxisif indicated 4. Aggressive pulmonary toilet with o2,  bronchodilatation, and incentive spirometry and early ambulation Mobility is key. She does not have any formal diagnosis of sleep apnea however with her obesity could have a component of hypoventilation or even obstructive sleep apnea so if difficulties in the postop setting would consider CPAP or BiPAP if indicated."  Echo 10/11/18: EF 65-70%, normal RV function, normal valves Nuclear stress test 03/05/18:  - Normal stress nuclear study with no ischemia or infarction. Gated ejection fraction 68% with normal wall motion.  Reproductive/Obstetrics                           Anesthesia Physical Anesthesia Plan  ASA: IV  Anesthesia Plan: General   Post-op Pain Management:    Induction: Intravenous  PONV Risk Score and Plan: 3 and Ondansetron, Dexamethasone, Midazolam and Treatment may vary due to age or medical condition  Airway Management Planned: LMA  Additional Equipment: None  Intra-op Plan:   Post-operative Plan: Extubation in OR  Informed Consent: I have reviewed the patients History and Physical, chart, labs and discussed the procedure including the risks, benefits and alternatives for the proposed anesthesia with the patient or authorized representative who has indicated his/her understanding and acceptance.     Dental advisory given  Plan Discussed with:   Anesthesia Plan Comments: (See APP note by Durel Salts, FNP. Pt at mod-high pulmonary risk for surgery. )      Anesthesia Quick Evaluation

## 2019-06-02 NOTE — H&P (Signed)
[name] is an 59 y.o. female with a last normal menstrual period in 2014. She had menstrual bleeding through much of February through May. Office U/S noted uterus 8.0 x 3.8 x 4.7 cm.Ovaries not visualized. SHG > 3.3 cm thick density of anterior wall of cavity. EM biopsy>benign.  Pertinent Gynecological History: Menses: post-menopausal Bleeding: post menopausal bleeding Contraception: none DES exposure: denies Blood transfusions: none Sexually transmitted diseases: no past history Previous GYN Procedures: Pelvic sling  Last mammogram: unknown Date: unknown Last pap: unknown Date: N/A OB History: G0, P0   Menstrual History: Menarche age: unknown No LMP recorded. (Menstrual status: Perimenopausal).    Past Medical History:  Diagnosis Date  . Acute blood loss anemia 06/23/2013  . 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 Anxiety associated with depression 09/23/2012  . At high risk for falls 09/23/2012   She has had a fractured ankle and tendon tear with falls over the last couple years.  . Cardiomyopathy ([place])   . Chronic fatigue 05/11/2014  . Chronic right-sided low back pain with right-sided sciatica 12/26/2016  . Colitis   . Constipation, chronic 09/27/2012  . Diabetes mellitus without complication ([place])   . 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 Diverticulitis of large intestine with perforation 09/23/2012  . Dyspnea 05/11/2014  . GERD (gastroesophageal reflux disease)   . Hemoptysis 06/23/2013  . History of endometriosis 1997  . Hx of tobacco use, presenting hazards to health 09/23/2012  . Hyperglycemia 07/24/2013  . Hypertension   . Hypokalemia 07/20/2013  . ILD (interstitial lung disease) ([place]) 01/22/2014  . Leukocytosis 05/11/2014  . MPA (microscopic polyangiitis) ([place]) 2015  . Numbness   . Obesity (BMI 30-39.9) 09/23/2012  . Paresthesia 12/26/2016  . Perforation of sigmoid colon - stercoral 09/27/2012  . Physical  deconditioning 04/30/2015  . 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  . Respiratory failure with hypoxia ([place]) 06/23/2013  . Right leg swelling 12/26/2016    Past Surgical History:  Procedure Laterality Date  . APPENDECTOMY    . BLADDER REPAIR    . LAPAROSCOPY  1997   dx of endometriosis  . LAPAROSCOPY  04/28/2000    Laparoscopy with lysis of adhesions, hysteroscopy, D&C.  Marland Kitchen TONSILLECTOMY      Family History  Problem Relation Age of Onset  . Diabetes Mother        AODM  . Diabetes Father        AODM  . Hypertension Father   . Obesity Father   . Breast cancer Paternal Grandmother   . Birth defects Cousin   . CAD Other        [facility]    Social History:  reports that she quit smoking about 18 years ago. She has a 27.00 pack-year smoking history. She has never used smokeless tobacco. She reports that she does not drink alcohol or use drugs.  Allergies:  Allergies  Allergen Reactions  . Guaifenesin Nausea And Vomiting and Other (See Comments)    un  . Mucinex [Guaifenesin Er] Nausea And Vomiting  . Sulfa Antibiotics Nausea And Vomiting    No medications prior to admission.    Review of Systems  Constitutional: Negative for fever.    There were no vitals taken for this visit. Physical Exam  Cardiovascular: Normal rate.  Respiratory: Effort normal.  GI: Soft.    No results found for this or any previous visit (from the past 24 hour(s)).  No results found.  Assessment/Plan: 59 yo G0P0  with PMB and significant thickening of EM on U/S H/S, D&C and possible Myosure resection discussed. Risks reviewed including but not limited to infection, uterine perforation, organ damage, bleeding/transfusion-HIV/Hep, DVT/PE, pneumonia. Medical clearance obtained.    Shon Millet II 06/02/2019, 6:01 PM

## 2019-06-02 NOTE — Progress Notes (Signed)
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   Pulmonology clearance: [name], NP with pulmonology cleared for surgery 05/23/19 at moderate to high risk due to comorbidities, BMI, and immunosuppressive meds. Recommends procedure be done in a tertiary setting with pulmonary cardiology and multispecialty providers available.   [name] notes: "Major Pulmonary risks identified in the multifactorial risk analysis are but not limited to a) pneumonia; b) recurrent intubation risk; c) prolonged or recurrent acute respiratory failure needing mechanical ventilation; d) prolonged hospitalization; e) DVT/Pulmonary embolism; f) Acute Pulmonary edema  Recommend 1. Short duration of surgery as much as possible and avoid paralytic if possible 2. Recovery in step down or ICU with Pulmonary consultation if indicated 3. DVT prophylaxis if indicated 4. Aggressive pulmonary toilet with o2, bronchodilatation, and incentive spirometry and early ambulation Mobility is key.  She does not have any formal diagnosis of sleep apnea however with her obesity could have a component of hypoventilation or even obstructive sleep apnea so if difficulties in the postop setting would consider CPAP or BiPAP if indicated."   Cardiology clearance:  Leanor Kail, PA cleared pt for surgery 05/21/19 at acceptable cardiac risk.    VS: Pulse 79   Temp 36.8 C (Oral)   Resp 20   Wt (!)  174.4 kg   SpO2 99%   BMI 57.60 kg/m    PROVIDERS: - PCP is Christain Sacramento, MD (notes in care everywhere)   - Cardiologist is Jenkins Rouge, MD. Last office visit 05/21/19 with Leanor Kail, PA who cleared pt for surgery at acceptable risk.   - Pulmonologist is Brand Males, MD. Last office visit 05/23/19 with [name], NP who cleared patient for surgery at moderate to high risk due to comorbidities, BMI, and immunosuppressive meds. Recommends procedure be done in a tertiary setting with pulmonary cardiology and multispecialty providers available.   - Rheumatologist is Lahoma Rocker, MD   LABS: Labs reviewed: Acceptable for surgery. (all labs ordered are listed, but only abnormal results are displayed)  Labs Reviewed  GLUCOSE, CAPILLARY - Abnormal; Notable for the following components:      Result Value   Glucose-Capillary 136 (*)    All other components within normal limits  CBC - Abnormal; Notable for the following components:   WBC 12.5 (*)    All other components within normal limits  COMPREHENSIVE METABOLIC PANEL - Abnormal; Notable for the following components:   Glucose, Bld 148 (*)    BUN 24 (*)    AST 14 (*)    All other components within normal limits  HEMOGLOBIN A1C - Abnormal; Notable for the following components:   Hgb A1c MFr Bld 7.0 (*)    All other components within normal limits  TYPE AND SCREEN     IMAGES: CT chest hi res 07/24/18:  1. No acute abnormality of the lungs. No evidence of fibrotic interstitial lung disease. 2.  Coronary artery disease.   PFT 05/31/18 (results in care everywhere):  FEV1 63%, ratio 77, FVC 61%, DLCO 92% (BMI 57)   EKG 05/21/19:  NSR   CV: Echo 10/11/18:  1. LV EF, by visual estimation, is 65 to 70%. LV normal function. Normal LV size. There is no LV hypertrophy.  2. Definity contrast agent was given IV to delineate the LV endocardial borders.  3. Global RV has normal systolic function.RV size is normal.  No increase in RV wall thickness.  4. Left atrial size was normal.  5. Right atrial size was normal.  6. The mitral valve is normal in structure. Trace mitral valve regurgitation.  7. The tricuspid valve is normal in structure. Tricuspid valve regurgitation was not visualized by color flow Doppler.  8. The aortic valve is normal in structure. Aortic valve regurgitation was not visualized by color flow Doppler.  9. The pulmonic valve was normal in structure. Pulmonic valve regurgitation is not visualized by color flow Doppler.  10. The inferior vena cava is normal in size with <50% respiratory variability, suggesting right atrial pressure of 8 mmHg.  11. The left ventricular function has unchanged.  - In comparison to the previous echocardiogram(s): The left ventricular  function is unchanged. 03/26/18 EF 65%.    Nuclear stress test 03/05/18:  - Normal stress nuclear study with no ischemia or infarction.  Gated ejection fraction 68% with normal wall motion.    Past Medical History:  Diagnosis Date  . Acute blood loss anemia 06/23/2013  . 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 Anxiety associated with depression 09/23/2012  . At high risk for falls 09/23/2012   She has had a fractured ankle and tendon tear with falls over the last couple years.  . Cardiomyopathy ([place])   . Chronic fatigue 05/11/2014  . Chronic right-sided low back pain with right-sided sciatica 12/26/2016  . Colitis   . Constipation, chronic 09/27/2012  . Diabetes mellitus without complication ([place])   . 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 Diverticulitis of large intestine with perforation 09/23/2012  . Dyspnea 05/11/2014  . GERD (gastroesophageal reflux disease)   . Hemoptysis 06/23/2013  . History of endometriosis 1997  . Hx of tobacco use, presenting hazards to health 09/23/2012  . Hyperglycemia 07/24/2013  . Hypertension   . Hypokalemia 07/20/2013  . ILD  (interstitial lung disease) ([place]) 01/22/2014  . Leukocytosis 05/11/2014  . MPA (microscopic polyangiitis) ([place]) 2015  . Numbness   . Obesity (BMI 30-39.9) 09/23/2012  . Paresthesia 12/26/2016  . Perforation of sigmoid colon - stercoral 09/27/2012  . Physical deconditioning 04/30/2015  . 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  . Respiratory failure with hypoxia ([place]) 06/23/2013  . Right leg swelling 12/26/2016    Past Surgical History:  Procedure Laterality Date  . APPENDECTOMY    . BLADDER REPAIR    . LAPAROSCOPY  1997   dx of endometriosis  . LAPAROSCOPY  04/28/2000    Laparoscopy with lysis of adhesions, hysteroscopy, D&C.  Marland Kitchen TONSILLECTOMY      MEDICATIONS: . ALPRAZolam (XANAX) 1 MG tablet  . buPROPion (WELLBUTRIN XL) 150 MG 24 hr tablet  . carvedilol (COREG) 12.5 MG tablet  . citalopram (CELEXA) 40 MG tablet  . clobetasol cream (TEMOVATE) 0.05 %  . dextromethorphan (DELSYM) 30 MG/5ML liquid  . ENTRESTO 49-51 MG  . esomeprazole (NEXIUM) 20 MG capsule  . Fluticasone-Salmeterol (ADVAIR) 100-50 MCG/DOSE AEPB  . folic acid (FOLVITE) 1 MG tablet  . gabapentin (NEURONTIN) 400 MG capsule  . meloxicam (MOBIC) 15 MG tablet  .  metFORMIN (GLUCOPHAGE-XR) 500 MG 24 hr tablet  . methotrexate (50 MG/ML) 1 g injection  . Multiple Vitamin (MULTIVITAMIN) tablet  . mycophenolate (CELLCEPT) 500 MG tablet  . phentermine (ADIPEX-P) 37.5 MG tablet  . polyethylene glycol (MIRALAX / GLYCOLAX) packet   No current facility-administered medications for this encounter.   Derrill Memo ON 06/03/2019] ceFAZolin (ANCEF) 3 g in dextrose 5 % 50 mL IVPB   - Pt instructed to stop phentermine 05/30/19   If no changes, I anticipate pt can proceed with surgery as scheduled.   Willeen Cass, FNP-BC [facility] Stay [facility]/Anesthesiology Phone: [phone] 06/02/2019 10:13 AM

## 2019-06-03 ENCOUNTER — Ambulatory Visit (HOSPITAL_COMMUNITY): Payer: Commercial Managed Care - PPO | Admitting: Emergency Medicine

## 2019-06-03 ENCOUNTER — Encounter (HOSPITAL_COMMUNITY)
Admission: RE | Disposition: A | Discharge status: Home / Self Care | Payer: Self-pay | Source: Home / Self Care | Attending: Obstetrics and Gynecology

## 2019-06-03 ENCOUNTER — Ambulatory Visit (HOSPITAL_COMMUNITY): Payer: Commercial Managed Care - PPO | Admitting: Certified Registered Nurse Anesthetist

## 2019-06-03 ENCOUNTER — Other Ambulatory Visit: Payer: Self-pay

## 2019-06-03 ENCOUNTER — Ambulatory Visit (HOSPITAL_COMMUNITY)
Admission: RE | Admit: 2019-06-03 | Discharge: 2019-06-03 | Disposition: A | Discharge status: Home / Self Care | Payer: Commercial Managed Care - PPO | Attending: Obstetrics and Gynecology | Admitting: Obstetrics and Gynecology

## 2019-06-03 DIAGNOSIS — I10 Essential (primary) hypertension: Secondary | ICD-10-CM | POA: Diagnosis not present

## 2019-06-03 DIAGNOSIS — E119 Type 2 diabetes mellitus without complications: Secondary | ICD-10-CM | POA: Diagnosis not present

## 2019-06-03 DIAGNOSIS — F329 Major depressive disorder, single episode, unspecified: Secondary | ICD-10-CM | POA: Insufficient documentation

## 2019-06-03 DIAGNOSIS — Z6841 Body Mass Index (BMI) 40.0 and over, adult: Secondary | ICD-10-CM | POA: Insufficient documentation

## 2019-06-03 DIAGNOSIS — J849 Interstitial pulmonary disease, unspecified: Secondary | ICD-10-CM | POA: Insufficient documentation

## 2019-06-03 DIAGNOSIS — K219 Gastro-esophageal reflux disease without esophagitis: Secondary | ICD-10-CM | POA: Diagnosis not present

## 2019-06-03 DIAGNOSIS — I110 Hypertensive heart disease with heart failure: Secondary | ICD-10-CM | POA: Diagnosis not present

## 2019-06-03 DIAGNOSIS — N950 Postmenopausal bleeding: Secondary | ICD-10-CM | POA: Diagnosis not present

## 2019-06-03 DIAGNOSIS — Z833 Family history of diabetes mellitus: Secondary | ICD-10-CM | POA: Diagnosis not present

## 2019-06-03 DIAGNOSIS — R0600 Dyspnea, unspecified: Secondary | ICD-10-CM | POA: Insufficient documentation

## 2019-06-03 DIAGNOSIS — I509 Heart failure, unspecified: Secondary | ICD-10-CM | POA: Insufficient documentation

## 2019-06-03 DIAGNOSIS — Z8349 Family history of other endocrine, nutritional and metabolic diseases: Secondary | ICD-10-CM | POA: Insufficient documentation

## 2019-06-03 DIAGNOSIS — F419 Anxiety disorder, unspecified: Secondary | ICD-10-CM | POA: Insufficient documentation

## 2019-06-03 DIAGNOSIS — Z882 Allergy status to sulfonamides status: Secondary | ICD-10-CM | POA: Diagnosis not present

## 2019-06-03 DIAGNOSIS — Z87891 Personal history of nicotine dependence: Secondary | ICD-10-CM | POA: Insufficient documentation

## 2019-06-03 DIAGNOSIS — N840 Polyp of corpus uteri: Secondary | ICD-10-CM | POA: Insufficient documentation

## 2019-06-03 DIAGNOSIS — Z7984 Long term (current) use of oral hypoglycemic drugs: Secondary | ICD-10-CM | POA: Insufficient documentation

## 2019-06-03 DIAGNOSIS — Z8489 Family history of other specified conditions: Secondary | ICD-10-CM | POA: Diagnosis not present

## 2019-06-03 DIAGNOSIS — Z8249 Family history of ischemic heart disease and other diseases of the circulatory system: Secondary | ICD-10-CM | POA: Diagnosis not present

## 2019-06-03 DIAGNOSIS — Z888 Allergy status to other drugs, medicaments and biological substances status: Secondary | ICD-10-CM | POA: Insufficient documentation

## 2019-06-03 DIAGNOSIS — R0602 Shortness of breath: Secondary | ICD-10-CM | POA: Diagnosis not present

## 2019-06-03 DIAGNOSIS — Z803 Family history of malignant neoplasm of breast: Secondary | ICD-10-CM | POA: Insufficient documentation

## 2019-06-03 DIAGNOSIS — I429 Cardiomyopathy, unspecified: Secondary | ICD-10-CM | POA: Diagnosis not present

## 2019-06-03 HISTORY — PX: DILATATION & CURETTAGE/HYSTEROSCOPY WITH MYOSURE: SHX6511

## 2019-06-03 LAB — GLUCOSE, CAPILLARY
Glucose-Capillary: 135 mg/dL — ABNORMAL HIGH (ref 70–99)
Glucose-Capillary: 115 mg/dL — ABNORMAL HIGH (ref 70–99)

## 2019-06-03 SURGERY — DILATATION & CURETTAGE/HYSTEROSCOPY WITH MYOSURE
Anesthesia: General | Site: Vagina

## 2019-06-03 MED ORDER — ACETAMINOPHEN 10 MG/ML IV SOLN
INTRAVENOUS | Status: DC | PRN
  Administered 2019-06-03: 1000 mg via INTRAVENOUS

## 2019-06-03 MED ORDER — LIDOCAINE HCL 1 % IJ SOLN
INTRAMUSCULAR | Status: DC | PRN
  Administered 2019-06-03: 10 mL

## 2019-06-03 MED ORDER — ONDANSETRON HCL 4 MG/2ML IJ SOLN
INTRAMUSCULAR | Status: AC
  Filled 2019-06-03: qty 2

## 2019-06-03 MED ORDER — OXYCODONE HCL 5 MG/5ML PO SOLN: 5.0000 mg | Freq: Once | ORAL | Status: DC | PRN

## 2019-06-03 MED ORDER — OXYCODONE HCL 5 MG PO TABS: 5.0000 mg | ORAL_TABLET | Freq: Once | ORAL | Status: DC | PRN

## 2019-06-03 MED ORDER — DEXAMETHASONE SODIUM PHOSPHATE 10 MG/ML IJ SOLN
INTRAMUSCULAR | Status: DC | PRN
  Administered 2019-06-03: 5 mg via INTRAVENOUS

## 2019-06-03 MED ORDER — SODIUM CHLORIDE 0.9 % IR SOLN
Status: DC | PRN
  Administered 2019-06-03: 3000 mL

## 2019-06-03 MED ORDER — PROPOFOL 10 MG/ML IV BOLUS
INTRAVENOUS | Status: DC | PRN
  Administered 2019-06-03: 200 mg via INTRAVENOUS

## 2019-06-03 MED ORDER — LACTATED RINGERS IV SOLN: INTRAVENOUS | Status: DC | PRN

## 2019-06-03 MED ORDER — CHLORHEXIDINE GLUCONATE 0.12 % MT SOLN
15.0000 mL | Freq: Once | OROMUCOSAL | Status: AC
  Administered 2019-06-03: 15 mL via OROMUCOSAL
  Filled 2019-06-03: qty 15

## 2019-06-03 MED ORDER — LIDOCAINE 2% (20 MG/ML) 5 ML SYRINGE
INTRAMUSCULAR | Status: DC | PRN
  Administered 2019-06-03: 100 mg via INTRAVENOUS

## 2019-06-03 MED ORDER — DEXAMETHASONE SODIUM PHOSPHATE 10 MG/ML IJ SOLN
INTRAMUSCULAR | Status: AC
  Filled 2019-06-03: qty 1

## 2019-06-03 MED ORDER — KETOROLAC TROMETHAMINE 30 MG/ML IJ SOLN
INTRAMUSCULAR | Status: AC
  Filled 2019-06-03: qty 1

## 2019-06-03 MED ORDER — LIDOCAINE 2% (20 MG/ML) 5 ML SYRINGE
INTRAMUSCULAR | Status: AC
  Filled 2019-06-03: qty 5

## 2019-06-03 MED ORDER — FENTANYL CITRATE (PF) 100 MCG/2ML IJ SOLN
INTRAMUSCULAR | Status: AC
  Filled 2019-06-03: qty 2

## 2019-06-03 MED ORDER — ONDANSETRON HCL 4 MG/2ML IJ SOLN
INTRAMUSCULAR | Status: DC | PRN
  Administered 2019-06-03: 4 mg via INTRAVENOUS

## 2019-06-03 MED ORDER — ONDANSETRON HCL 4 MG/2ML IJ SOLN: 4.0000 mg | Freq: Once | INTRAMUSCULAR | Status: DC | PRN

## 2019-06-03 MED ORDER — ORAL CARE MOUTH RINSE: 15.0000 mL | Freq: Once | OROMUCOSAL | Status: AC

## 2019-06-03 MED ORDER — PROPOFOL 10 MG/ML IV BOLUS
INTRAVENOUS | Status: AC
  Filled 2019-06-03: qty 40

## 2019-06-03 MED ORDER — SOD CITRATE-CITRIC ACID 500-334 MG/5ML PO SOLN
30.0000 mL | ORAL | Status: AC
  Administered 2019-06-03: 30 mL via ORAL
  Filled 2019-06-03: qty 30

## 2019-06-03 MED ORDER — LACTATED RINGERS IV SOLN: INTRAVENOUS | Status: DC

## 2019-06-03 MED ORDER — MIDAZOLAM HCL 5 MG/5ML IJ SOLN
INTRAMUSCULAR | Status: DC | PRN
  Administered 2019-06-03: 1 mg via INTRAVENOUS
  Administered 2019-06-03: .5 mg via INTRAVENOUS

## 2019-06-03 MED ORDER — KETOROLAC TROMETHAMINE 30 MG/ML IJ SOLN
INTRAMUSCULAR | Status: DC | PRN
  Administered 2019-06-03: 15 mg via INTRAVENOUS

## 2019-06-03 MED ORDER — FENTANYL CITRATE (PF) 250 MCG/5ML IJ SOLN
INTRAMUSCULAR | Status: AC
  Filled 2019-06-03: qty 5

## 2019-06-03 MED ORDER — FENTANYL CITRATE (PF) 100 MCG/2ML IJ SOLN
25.0000 ug | INTRAMUSCULAR | Status: DC | PRN
  Administered 2019-06-03: 25 ug via INTRAVENOUS

## 2019-06-03 MED ORDER — MIDAZOLAM HCL 2 MG/2ML IJ SOLN
INTRAMUSCULAR | Status: AC
  Filled 2019-06-03: qty 2

## 2019-06-03 MED ORDER — ACETAMINOPHEN 10 MG/ML IV SOLN
INTRAVENOUS | Status: AC
  Filled 2019-06-03: qty 100

## 2019-06-03 MED ORDER — ROCURONIUM BROMIDE 10 MG/ML (PF) SYRINGE
PREFILLED_SYRINGE | INTRAVENOUS | Status: AC
  Filled 2019-06-03: qty 10

## 2019-06-03 MED ORDER — LIDOCAINE HCL (PF) 1 % IJ SOLN
INTRAMUSCULAR | Status: AC
  Filled 2019-06-03: qty 30

## 2019-06-03 SURGICAL SUPPLY — 15 items
CATH ROBINSON RED A/P 16FR (CATHETERS) ×2 IMPLANT
DEVICE MYOSURE LITE (MISCELLANEOUS) IMPLANT
DEVICE MYOSURE REACH (MISCELLANEOUS) ×1 IMPLANT
GLOVE BIO SURGEON STRL SZ7.5 (GLOVE) ×4 IMPLANT
GLOVE BIOGEL PI IND STRL 7.0 (GLOVE) ×1 IMPLANT
GLOVE BIOGEL PI INDICATOR 7.0 (GLOVE) ×1
GOWN STRL REUS W/ TWL LRG LVL3 (GOWN DISPOSABLE) ×2 IMPLANT
GOWN STRL REUS W/TWL LRG LVL3 (GOWN DISPOSABLE) ×4
KIT PROCEDURE FLUENT (KITS) ×2 IMPLANT
KIT TURNOVER KIT B (KITS) ×2 IMPLANT
PACK VAGINAL MINOR WOMEN LF (CUSTOM PROCEDURE TRAY) ×2 IMPLANT
PAD OB MATERNITY 4.3X12.25 (PERSONAL CARE ITEMS) ×2 IMPLANT
SEAL ROD LENS SCOPE MYOSURE (ABLATOR) ×2 IMPLANT
TOWEL GREEN STERILE FF (TOWEL DISPOSABLE) ×4 IMPLANT
UNDERPAD 30X36 HEAVY ABSORB (UNDERPADS AND DIAPERS) ×2 IMPLANT

## 2019-06-03 NOTE — Progress Notes (Signed)
No changes to H&P per patient history Reviewed with patient procedure-H/S, D&C, possible Myosure Post op instructions reviewed Patient states she understands and agrees

## 2019-06-03 NOTE — Progress Notes (Signed)
06/03/2019  7:46 AM  PATIENT:  [name]  59 y.o. female  PRE-OPERATIVE DIAGNOSIS:  Post menopausal bleeding  POST-OPERATIVE DIAGNOSIS:  post menopausal bleeding  PROCEDURE:  Procedure(s): DILATATION & CURETTAGE/HYSTEROSCOPY WITH MYOSURE (N/A)  SURGEON:  Surgeon(s) and Role:    * Everlene Farrier, MD - Primary  PHYSICIAN ASSISTANT:   ASSISTANTS: none   ANESTHESIA:   MAC  EBL:  20 mL   BLOOD ADMINISTERED:none  DRAINS: none   LOCAL MEDICATIONS USED:  LIDOCAINE  and Amount: 10 ml  SPECIMEN:  Source of Specimen:  endometrial resection, endometrial currettings  DISPOSITION OF SPECIMEN:  PATHOLOGY  COUNTS:  YES  TOURNIQUET:  * No tourniquets in log *  DICTATION: .Other Dictation: Dictation Number [id number]  PLAN OF CARE: Discharge to home after PACU  PATIENT DISPOSITION:  PACU - hemodynamically stable.   Delay start of Pharmacological VTE agent (>24hrs) due to surgical blood loss or risk of bleeding: not applicable

## 2019-06-03 NOTE — Transfer of Care (Signed)
Immediate Anesthesia Transfer of Care Note  Patient: [name]  Procedure(s) Performed: DILATATION & CURETTAGE/HYSTEROSCOPY WITH MYOSURE (N/A Vagina )  Patient Location: PACU  Anesthesia Type:General  Level of Consciousness: awake  Airway & Oxygen Therapy: Patient Spontanous Breathing and Patient connected to face mask oxygen  Post-op Assessment: Report given to RN and Post -op Vital signs reviewed and stable  Post vital signs: Reviewed  Last Vitals:  Vitals Value Taken Time  BP 116/67 06/03/19 0759  Temp    Pulse 76 06/03/19 0800  Resp 19 06/03/19 0800  SpO2 100 % 06/03/19 0800  Vitals shown include unvalidated device data.  Last Pain:  Vitals:   06/03/19 0651  PainSc: 4          Complications: No apparent anesthesia complications

## 2019-06-03 NOTE — Anesthesia Postprocedure Evaluation (Signed)
Anesthesia Post Note  Patient: [name]  Procedure(s) Performed: DILATATION & CURETTAGE/HYSTEROSCOPY WITH MYOSURE (N/A Vagina )     Patient location during evaluation: PACU Anesthesia Type: General Level of consciousness: awake and alert Pain management: pain level controlled Vital Signs Assessment: post-procedure vital signs reviewed and stable Respiratory status: spontaneous breathing, nonlabored ventilation and respiratory function stable Cardiovascular status: blood pressure returned to baseline and stable Postop Assessment: no apparent nausea or vomiting Anesthetic complications: no    Last Vitals:  Vitals:   06/03/19 0917 06/03/19 0918  BP:  132/62  Pulse: 65 66  Resp: 13 12  Temp:  36.8 C  SpO2: 94% 94%    Last Pain:  Vitals:   06/03/19 0830  PainSc: 10-Worst pain ever                 Lidia Collum

## 2019-06-03 NOTE — Anesthesia Procedure Notes (Signed)
Procedure Name: LMA Insertion Date/Time: 06/03/2019 7:26 AM Performed by: Janene Harvey, CRNA Pre-anesthesia Checklist: Patient identified, Emergency Drugs available, Suction available and Patient being monitored Patient Re-evaluated:Patient Re-evaluated prior to induction Oxygen Delivery Method: Circle system utilized Preoxygenation: Pre-oxygenation with 100% oxygen Induction Type: IV induction LMA: LMA inserted LMA Size: 5.0 Placement Confirmation: positive ETCO2 Dental Injury: Teeth and Oropharynx as per pre-operative assessment

## 2019-06-04 LAB — SURGICAL PATHOLOGY

## 2019-06-04 NOTE — Telephone Encounter (Signed)
Surgical clearance was faxed with office note.  See o/v.

## 2019-06-04 NOTE — Op Note (Signed)
NAME: [name], [name] MEDICAL RECORD [id number] ACCOUNT [id number] DATE OF BIRTH:07/15/1960 FACILITY: [facility] LOCATION: [place], [place]  OPERATIVE REPORT  DATE OF PROCEDURE:  06/03/2019  PREOPERATIVE DIAGNOSIS:  Postmenopausal bleeding.  POSTOPERATIVE DIAGNOSIS:  Postmenopausal bleeding.  PROCEDURE:  Hysteroscopy, dilation and curettage, and MyoSure resection of endometrial mass.  SURGEON:  Everlene Farrier, MD   ANESTHESIA:  Sedation with LMA.  ESTIMATED BLOOD LOSS:  Less than 50 mL.  INPUTS AND OUTPUTS OF DISTENDING MEDIA:  50 mL deficit.  SPECIMENS:  Endometrial curettings and endometrial resection to pathology.  INDICATIONS AND CONSENT:  This patient is a 59 year old G0 P0 with postmenopausal bleeding.  Details are dictated in history and physical.  Hysteroscopy, dilation and curettage, possible MyoSure resection has been discussed preoperatively.  Potential  risks and complications are reviewed preoperatively including but not limited to infection, organ damage, bleeding requiring transfusion of blood products with HIV and hepatitis acquisition, pneumonia, DVT and PE.  The patient states she understands and  agrees.  Consent was signed on the chart.  FINDINGS:  Both fallopian tube ostia are identified.  There is an approximately 3 cm pedunculated polypoid mass arising in the superior portion of the endometrial cavity.  The remainder of the cavity appears atrophic.  DESCRIPTION OF PROCEDURE:  The patient was taken to the operating room where she was identified, placed in the dorsal supine position and sedation was administered and LMA was placed.  She was placed in the dorsal lithotomy position.  She was prepped,  bladder straight catheterized, and draped in sterile fashion.  Timeout was undertaken.  Bivalve speculum was placed in the vagina and the anterior cervical lip was injected with 1% plain lidocaine and grasped with a single tooth tenaculum.   Paracervical  block was placed at the 2, 4, 5, 7, 8, and 10 o'clock positions with approximately 10 mL of the same solution.  Cervix was gently progressively dilated.  A hysteroscope was placed in the endocervical canal and advanced under direct visualization using  distending media.  The above findings noted.  MyoSure was then used to resect the endometrial mass.  Good hemostasis was noted.  The hysteroscope was removed and gentle sharp curettage was done for scant tissue.  Inspection reveals good hemostasis.   Instruments were removed.  All counts were correct.  The patient was awakened and taken to recovery room in stable condition.  CN/NUANCE  D:06/03/2019 T:06/04/2019 JOB:[id number]

## 2019-06-05 ENCOUNTER — Ambulatory Visit: Payer: Commercial Managed Care - PPO | Admitting: Cardiology

## 2019-06-11 ENCOUNTER — Emergency Department (HOSPITAL_COMMUNITY): Payer: Commercial Managed Care - PPO

## 2019-06-11 ENCOUNTER — Ambulatory Visit: Payer: Commercial Managed Care - PPO | Admitting: Internal Medicine

## 2019-06-11 ENCOUNTER — Inpatient Hospital Stay (HOSPITAL_COMMUNITY)
Admission: EM | Admit: 2019-06-11 | Discharge: 2019-06-30 | DRG: 871 | Disposition: A | Discharge status: Skilled Nursing Facility | Payer: Commercial Managed Care - PPO | Attending: Internal Medicine | Admitting: Internal Medicine

## 2019-06-11 DIAGNOSIS — N170 Acute kidney failure with tubular necrosis: Secondary | ICD-10-CM | POA: Diagnosis not present

## 2019-06-11 DIAGNOSIS — Z888 Allergy status to other drugs, medicaments and biological substances status: Secondary | ICD-10-CM | POA: Diagnosis not present

## 2019-06-11 DIAGNOSIS — T508X5A Adverse effect of diagnostic agents, initial encounter: Secondary | ICD-10-CM | POA: Diagnosis not present

## 2019-06-11 DIAGNOSIS — Z6841 Body Mass Index (BMI) 40.0 and over, adult: Secondary | ICD-10-CM

## 2019-06-11 DIAGNOSIS — N141 Nephropathy induced by other drugs, medicaments and biological substances: Secondary | ICD-10-CM | POA: Diagnosis not present

## 2019-06-11 DIAGNOSIS — I10 Essential (primary) hypertension: Secondary | ICD-10-CM | POA: Diagnosis present

## 2019-06-11 DIAGNOSIS — I7789 Other specified disorders of arteries and arterioles: Secondary | ICD-10-CM | POA: Diagnosis present

## 2019-06-11 DIAGNOSIS — K5732 Diverticulitis of large intestine without perforation or abscess without bleeding: Secondary | ICD-10-CM | POA: Diagnosis not present

## 2019-06-11 DIAGNOSIS — E669 Obesity, unspecified: Secondary | ICD-10-CM | POA: Diagnosis not present

## 2019-06-11 DIAGNOSIS — I429 Cardiomyopathy, unspecified: Secondary | ICD-10-CM | POA: Diagnosis present

## 2019-06-11 DIAGNOSIS — Z8249 Family history of ischemic heart disease and other diseases of the circulatory system: Secondary | ICD-10-CM

## 2019-06-11 DIAGNOSIS — Z791 Long term (current) use of non-steroidal anti-inflammatories (NSAID): Secondary | ICD-10-CM

## 2019-06-11 DIAGNOSIS — J849 Interstitial pulmonary disease, unspecified: Secondary | ICD-10-CM | POA: Diagnosis present

## 2019-06-11 DIAGNOSIS — R188 Other ascites: Secondary | ICD-10-CM | POA: Diagnosis not present

## 2019-06-11 DIAGNOSIS — F329 Major depressive disorder, single episode, unspecified: Secondary | ICD-10-CM | POA: Diagnosis present

## 2019-06-11 DIAGNOSIS — B372 Candidiasis of skin and nail: Secondary | ICD-10-CM

## 2019-06-11 DIAGNOSIS — R339 Retention of urine, unspecified: Secondary | ICD-10-CM | POA: Diagnosis present

## 2019-06-11 DIAGNOSIS — L89322 Pressure ulcer of left buttock, stage 2: Secondary | ICD-10-CM | POA: Diagnosis present

## 2019-06-11 DIAGNOSIS — E876 Hypokalemia: Secondary | ICD-10-CM | POA: Diagnosis present

## 2019-06-11 DIAGNOSIS — A414 Sepsis due to anaerobes: Principal | ICD-10-CM | POA: Diagnosis present

## 2019-06-11 DIAGNOSIS — I951 Orthostatic hypotension: Secondary | ICD-10-CM | POA: Diagnosis present

## 2019-06-11 DIAGNOSIS — M5441 Lumbago with sciatica, right side: Secondary | ICD-10-CM | POA: Diagnosis present

## 2019-06-11 DIAGNOSIS — Z7951 Long term (current) use of inhaled steroids: Secondary | ICD-10-CM

## 2019-06-11 DIAGNOSIS — K5792 Diverticulitis of intestine, part unspecified, without perforation or abscess without bleeding: Secondary | ICD-10-CM | POA: Diagnosis present

## 2019-06-11 DIAGNOSIS — D84821 Immunodeficiency due to drugs: Secondary | ICD-10-CM | POA: Diagnosis present

## 2019-06-11 DIAGNOSIS — N809 Endometriosis, unspecified: Secondary | ICD-10-CM | POA: Diagnosis present

## 2019-06-11 DIAGNOSIS — Z882 Allergy status to sulfonamides status: Secondary | ICD-10-CM | POA: Diagnosis not present

## 2019-06-11 DIAGNOSIS — M317 Microscopic polyangiitis: Secondary | ICD-10-CM | POA: Diagnosis present

## 2019-06-11 DIAGNOSIS — R7881 Bacteremia: Secondary | ICD-10-CM | POA: Diagnosis not present

## 2019-06-11 DIAGNOSIS — Z79899 Other long term (current) drug therapy: Secondary | ICD-10-CM

## 2019-06-11 DIAGNOSIS — Z87891 Personal history of nicotine dependence: Secondary | ICD-10-CM

## 2019-06-11 DIAGNOSIS — Z20822 Contact with and (suspected) exposure to covid-19: Secondary | ICD-10-CM | POA: Diagnosis present

## 2019-06-11 DIAGNOSIS — N179 Acute kidney failure, unspecified: Secondary | ICD-10-CM

## 2019-06-11 DIAGNOSIS — E872 Acidosis: Secondary | ICD-10-CM | POA: Diagnosis not present

## 2019-06-11 DIAGNOSIS — K5900 Constipation, unspecified: Secondary | ICD-10-CM | POA: Diagnosis present

## 2019-06-11 DIAGNOSIS — K219 Gastro-esophageal reflux disease without esophagitis: Secondary | ICD-10-CM | POA: Diagnosis present

## 2019-06-11 DIAGNOSIS — L8990 Pressure ulcer of unspecified site, unspecified stage: Secondary | ICD-10-CM | POA: Insufficient documentation

## 2019-06-11 DIAGNOSIS — Z833 Family history of diabetes mellitus: Secondary | ICD-10-CM | POA: Diagnosis not present

## 2019-06-11 DIAGNOSIS — K760 Fatty (change of) liver, not elsewhere classified: Secondary | ICD-10-CM | POA: Diagnosis present

## 2019-06-11 DIAGNOSIS — R34 Anuria and oliguria: Secondary | ICD-10-CM | POA: Diagnosis not present

## 2019-06-11 DIAGNOSIS — F419 Anxiety disorder, unspecified: Secondary | ICD-10-CM | POA: Diagnosis present

## 2019-06-11 DIAGNOSIS — E119 Type 2 diabetes mellitus without complications: Secondary | ICD-10-CM | POA: Diagnosis present

## 2019-06-11 DIAGNOSIS — D649 Anemia, unspecified: Secondary | ICD-10-CM | POA: Diagnosis present

## 2019-06-11 DIAGNOSIS — Z7984 Long term (current) use of oral hypoglycemic drugs: Secondary | ICD-10-CM

## 2019-06-11 DIAGNOSIS — Z992 Dependence on renal dialysis: Secondary | ICD-10-CM

## 2019-06-11 DIAGNOSIS — I776 Arteritis, unspecified: Secondary | ICD-10-CM | POA: Diagnosis not present

## 2019-06-11 DIAGNOSIS — G8929 Other chronic pain: Secondary | ICD-10-CM | POA: Diagnosis present

## 2019-06-11 DIAGNOSIS — K5720 Diverticulitis of large intestine with perforation and abscess without bleeding: Secondary | ICD-10-CM | POA: Diagnosis present

## 2019-06-11 DIAGNOSIS — E8770 Fluid overload, unspecified: Secondary | ICD-10-CM | POA: Diagnosis not present

## 2019-06-11 DIAGNOSIS — N840 Polyp of corpus uteri: Secondary | ICD-10-CM | POA: Diagnosis present

## 2019-06-11 DIAGNOSIS — N950 Postmenopausal bleeding: Secondary | ICD-10-CM | POA: Diagnosis present

## 2019-06-11 DIAGNOSIS — Z803 Family history of malignant neoplasm of breast: Secondary | ICD-10-CM

## 2019-06-11 DIAGNOSIS — D849 Immunodeficiency, unspecified: Secondary | ICD-10-CM | POA: Diagnosis not present

## 2019-06-11 LAB — CBC WITH DIFFERENTIAL/PLATELET
Abs Immature Granulocytes: 0.07 10*3/uL (ref 0.00–0.07)
Basophils Absolute: 0 10*3/uL (ref 0.0–0.1)
Basophils Relative: 0 %
Eosinophils Absolute: 0.1 10*3/uL (ref 0.0–0.5)
Eosinophils Relative: 1 %
HCT: 43 % (ref 36.0–46.0)
Hemoglobin: 13.1 g/dL (ref 12.0–15.0)
Immature Granulocytes: 1 %
Lymphocytes Relative: 6 %
Lymphs Abs: 0.9 10*3/uL (ref 0.7–4.0)
MCH: 26.6 pg (ref 26.0–34.0)
MCHC: 30.5 g/dL (ref 30.0–36.0)
MCV: 87.2 fL (ref 80.0–100.0)
Monocytes Absolute: 0.7 10*3/uL (ref 0.1–1.0)
Monocytes Relative: 5 %
Neutro Abs: 13.4 10*3/uL — ABNORMAL HIGH (ref 1.7–7.7)
Neutrophils Relative %: 87 %
Platelets: 333 10*3/uL (ref 150–400)
RBC: 4.93 MIL/uL (ref 3.87–5.11)
RDW: 14.2 % (ref 11.5–15.5)
WBC: 15.3 10*3/uL — ABNORMAL HIGH (ref 4.0–10.5)
nRBC: 0 % (ref 0.0–0.2)

## 2019-06-11 LAB — CBC
HCT: 41.9 % (ref 36.0–46.0)
Hemoglobin: 12.7 g/dL (ref 12.0–15.0)
MCH: 26.5 pg (ref 26.0–34.0)
MCHC: 30.3 g/dL (ref 30.0–36.0)
MCV: 87.3 fL (ref 80.0–100.0)
Platelets: 308 10*3/uL (ref 150–400)
RBC: 4.8 MIL/uL (ref 3.87–5.11)
RDW: 14.2 % (ref 11.5–15.5)
WBC: 21.5 10*3/uL — ABNORMAL HIGH (ref 4.0–10.5)
nRBC: 0 % (ref 0.0–0.2)

## 2019-06-11 LAB — URINALYSIS, ROUTINE W REFLEX MICROSCOPIC
Bacteria, UA: NONE SEEN
Bilirubin Urine: NEGATIVE
Glucose, UA: NEGATIVE mg/dL
Ketones, ur: NEGATIVE mg/dL
Leukocytes,Ua: NEGATIVE
Nitrite: NEGATIVE
Protein, ur: NEGATIVE mg/dL
Specific Gravity, Urine: >1.046 — ABNORMAL HIGH (ref 1.005–1.030)
pH: 5 (ref 5.0–8.0)

## 2019-06-11 LAB — HIV ANTIBODY (ROUTINE TESTING W REFLEX): HIV Screen 4th Generation wRfx: NONREACTIVE

## 2019-06-11 LAB — COMPREHENSIVE METABOLIC PANEL
ALT: 15 U/L (ref 0–44)
AST: 14 U/L — ABNORMAL LOW (ref 15–41)
Albumin: 3.5 g/dL (ref 3.5–5.0)
Alkaline Phosphatase: 107 U/L (ref 38–126)
Anion gap: 12 (ref 5–15)
BUN: 19 mg/dL (ref 6–20)
CO2: 25 mmol/L (ref 22–32)
Calcium: 8.9 mg/dL (ref 8.9–10.3)
Chloride: 102 mmol/L (ref 98–111)
Creatinine, Ser: 1.03 mg/dL — ABNORMAL HIGH (ref 0.44–1.00)
GFR calc Af Amer: >60 mL/min (ref >60)
GFR calc non Af Amer: 60 mL/min — ABNORMAL LOW (ref >60)
Glucose, Bld: 159 mg/dL — ABNORMAL HIGH (ref 70–99)
Potassium: 4.4 mmol/L (ref 3.5–5.1)
Sodium: 139 mmol/L (ref 135–145)
Total Bilirubin: 0.8 mg/dL (ref 0.3–1.2)
Total Protein: 6.9 g/dL (ref 6.5–8.1)

## 2019-06-11 LAB — CREATININE, SERUM
Creatinine, Ser: 0.98 mg/dL (ref 0.44–1.00)
GFR calc Af Amer: >60 mL/min (ref >60)
GFR calc non Af Amer: >60 mL/min (ref >60)

## 2019-06-11 LAB — CBG MONITORING, ED: Glucose-Capillary: 165 mg/dL — ABNORMAL HIGH (ref 70–99)

## 2019-06-11 LAB — LIPASE, BLOOD: Lipase: 15 U/L (ref 11–51)

## 2019-06-11 LAB — SARS CORONAVIRUS 2 BY RT PCR (HOSPITAL ORDER, PERFORMED IN ~~LOC~~ HOSPITAL LAB): SARS Coronavirus 2: NEGATIVE

## 2019-06-11 LAB — GLUCOSE, CAPILLARY: Glucose-Capillary: 181 mg/dL — ABNORMAL HIGH (ref 70–99)

## 2019-06-11 MED ORDER — PIPERACILLIN-TAZOBACTAM 3.375 G IVPB
3.3750 g | Freq: Three times a day (TID) | INTRAVENOUS | Status: DC
  Administered 2019-06-11 – 2019-06-13 (×5): 3.375 g via INTRAVENOUS
  Filled 2019-06-11 (×5): qty 50

## 2019-06-11 MED ORDER — CARVEDILOL 12.5 MG PO TABS
12.5000 mg | ORAL_TABLET | Freq: Two times a day (BID) | ORAL | Status: DC
  Administered 2019-06-11: 12.5 mg via ORAL
  Filled 2019-06-11 (×2): qty 1

## 2019-06-11 MED ORDER — ONDANSETRON HCL 4 MG/2ML IJ SOLN
4.0000 mg | Freq: Once | INTRAMUSCULAR | Status: AC
  Administered 2019-06-11: 4 mg via INTRAVENOUS
  Filled 2019-06-11: qty 2

## 2019-06-11 MED ORDER — MORPHINE SULFATE (PF) 2 MG/ML IV SOLN
2.0000 mg | Freq: Once | INTRAVENOUS | Status: AC
  Administered 2019-06-11: 2 mg via INTRAVENOUS
  Filled 2019-06-11: qty 1

## 2019-06-11 MED ORDER — IOHEXOL 300 MG/ML  SOLN
100.0000 mL | Freq: Once | INTRAMUSCULAR | Status: AC | PRN
  Administered 2019-06-11: 100 mL via INTRAVENOUS

## 2019-06-11 MED ORDER — ONDANSETRON HCL 4 MG/2ML IJ SOLN
4.0000 mg | Freq: Four times a day (QID) | INTRAMUSCULAR | Status: DC | PRN
  Administered 2019-06-12 – 2019-06-25 (×6): 4 mg via INTRAVENOUS
  Filled 2019-06-11 (×7): qty 2

## 2019-06-11 MED ORDER — CITALOPRAM HYDROBROMIDE 40 MG PO TABS
40.0000 mg | ORAL_TABLET | Freq: Every day | ORAL | Status: DC
  Administered 2019-06-12 – 2019-06-30 (×19): 40 mg via ORAL
  Filled 2019-06-11 (×15): qty 1
  Filled 2019-06-11: qty 4
  Filled 2019-06-11 (×4): qty 1

## 2019-06-11 MED ORDER — SODIUM CHLORIDE 0.9 % IV BOLUS
1000.0000 mL | Freq: Once | INTRAVENOUS | Status: AC
  Administered 2019-06-11: 1000 mL via INTRAVENOUS

## 2019-06-11 MED ORDER — PIPERACILLIN-TAZOBACTAM 3.375 G IVPB 30 MIN
3.3750 g | Freq: Once | INTRAVENOUS | Status: AC
  Administered 2019-06-11: 3.375 g via INTRAVENOUS
  Filled 2019-06-11: qty 50

## 2019-06-11 MED ORDER — ENOXAPARIN SODIUM 80 MG/0.8ML ~~LOC~~ SOLN
80.0000 mg | SUBCUTANEOUS | Status: DC
  Administered 2019-06-11 – 2019-06-12 (×2): 80 mg via SUBCUTANEOUS
  Filled 2019-06-11 (×3): qty 0.8

## 2019-06-11 MED ORDER — ONDANSETRON HCL 4 MG PO TABS
4.0000 mg | ORAL_TABLET | Freq: Four times a day (QID) | ORAL | Status: DC | PRN
  Administered 2019-06-27: 4 mg via ORAL
  Filled 2019-06-11: qty 1

## 2019-06-11 MED ORDER — SACUBITRIL-VALSARTAN 49-51 MG PO TABS
1.0000 | ORAL_TABLET | Freq: Two times a day (BID) | ORAL | Status: DC
  Administered 2019-06-11: 1 via ORAL
  Filled 2019-06-11 (×3): qty 1

## 2019-06-11 MED ORDER — LACTATED RINGERS IV SOLN: INTRAVENOUS | Status: DC

## 2019-06-11 MED ORDER — HYDROMORPHONE HCL 1 MG/ML IJ SOLN
0.5000 mg | INTRAMUSCULAR | Status: DC | PRN
  Administered 2019-06-11 – 2019-06-13 (×6): 1 mg via INTRAVENOUS
  Administered 2019-06-14: 0.5 mg via INTRAVENOUS
  Administered 2019-06-14 – 2019-06-15 (×2): 1 mg via INTRAVENOUS
  Administered 2019-06-15: 0.5 mg via INTRAVENOUS
  Administered 2019-06-16 – 2019-06-19 (×12): 1 mg via INTRAVENOUS
  Administered 2019-06-20: 0.5 mg via INTRAVENOUS
  Administered 2019-06-20 – 2019-06-26 (×7): 1 mg via INTRAVENOUS
  Filled 2019-06-11: qty 1
  Filled 2019-06-11: qty 0.5
  Filled 2019-06-11 (×16): qty 1
  Filled 2019-06-11: qty 0.5
  Filled 2019-06-11 (×12): qty 1

## 2019-06-11 MED ORDER — ALPRAZOLAM 0.5 MG PO TABS: 1.0000 mg | ORAL_TABLET | Freq: Three times a day (TID) | ORAL | Status: DC | PRN

## 2019-06-11 MED ORDER — ACETAMINOPHEN 650 MG RE SUPP: 650.0000 mg | Freq: Four times a day (QID) | RECTAL | Status: DC | PRN

## 2019-06-11 MED ORDER — ACETAMINOPHEN 325 MG PO TABS
650.0000 mg | ORAL_TABLET | Freq: Four times a day (QID) | ORAL | Status: DC | PRN
  Administered 2019-06-11 – 2019-06-19 (×4): 650 mg via ORAL
  Filled 2019-06-11 (×4): qty 2

## 2019-06-11 MED ORDER — INSULIN ASPART 100 UNIT/ML ~~LOC~~ SOLN
0.0000 [IU] | SUBCUTANEOUS | Status: DC
  Administered 2019-06-11: 2 [IU] via SUBCUTANEOUS
  Administered 2019-06-12 (×3): 1 [IU] via SUBCUTANEOUS
  Administered 2019-06-12: 2 [IU] via SUBCUTANEOUS
  Administered 2019-06-13 – 2019-06-17 (×5): 1 [IU] via SUBCUTANEOUS
  Administered 2019-06-18 – 2019-06-19 (×2): 2 [IU] via SUBCUTANEOUS
  Administered 2019-06-20 – 2019-06-21 (×3): 1 [IU] via SUBCUTANEOUS
  Administered 2019-06-21 – 2019-06-22 (×2): 2 [IU] via SUBCUTANEOUS
  Administered 2019-06-22 (×2): 1 [IU] via SUBCUTANEOUS
  Administered 2019-06-23: 2 [IU] via SUBCUTANEOUS
  Administered 2019-06-24: 3 [IU] via SUBCUTANEOUS
  Administered 2019-06-25 (×2): 1 [IU] via SUBCUTANEOUS
  Administered 2019-06-25: 2 [IU] via SUBCUTANEOUS
  Administered 2019-06-25: 1 [IU] via SUBCUTANEOUS
  Administered 2019-06-26: 2 [IU] via SUBCUTANEOUS
  Administered 2019-06-26 – 2019-06-27 (×5): 1 [IU] via SUBCUTANEOUS
  Administered 2019-06-27: 2 [IU] via SUBCUTANEOUS
  Administered 2019-06-27: 1 [IU] via SUBCUTANEOUS
  Administered 2019-06-28 (×2): 2 [IU] via SUBCUTANEOUS
  Administered 2019-06-29 – 2019-06-30 (×4): 1 [IU] via SUBCUTANEOUS

## 2019-06-11 MED ORDER — GABAPENTIN 400 MG PO CAPS
400.0000 mg | ORAL_CAPSULE | Freq: Every evening | ORAL | Status: DC | PRN
  Administered 2019-06-11 – 2019-06-15 (×4): 400 mg via ORAL
  Filled 2019-06-11 (×6): qty 1

## 2019-06-11 MED ORDER — BUPROPION HCL ER (XL) 150 MG PO TB24
450.0000 mg | ORAL_TABLET | Freq: Every day | ORAL | Status: DC
  Administered 2019-06-12 – 2019-06-30 (×19): 450 mg via ORAL
  Filled 2019-06-11 (×20): qty 3

## 2019-06-11 NOTE — ED Triage Notes (Signed)
Pt arrives with [facility] from mother's house c/o increased abdominal cramping, and heaviness into pelvis. Pt had D&C procedure on 5/25. Pt reports passing 2 small BMs with the last one on 5/29. Pt was given 100 mcg fentanyl by EMS en route to ED. Pt has GI hx including a complete bowel obs in 2014 per pt.

## 2019-06-11 NOTE — H&P (Signed)
History and Physical    [name]:[id number] DOB: Aug 17, 1960 DOA: 06/11/2019  PCP: Christain Sacramento, MD  Patient coming from: Home  Chief Complaint: Abd pain  HPI: [name] is a 59 y.o. 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 Pt reportedly underwent D/C procedure on 5/25. Very shortly after the procedure, pt recalled abdominal "swelling" with subsequent abd pain. Pt believed herself to be constipated, thus was prescribed miralax. Pt had several BM without resolution of symptoms. Symptoms progressed and worsened, prompting visit to ED  ED Course: In the ED, pt was found to have WBC of 15k with HR of 110's with RR in the 20's. CT abd/pelvis was performed, which was notable for sigmoid diverticulitis. Pt was started on zosyn. Hospitalist consulted for consideration for admission.  Review of Systems:  Review of Systems  Constitutional: Negative for fever, malaise/fatigue and weight loss.  HENT: Negative for congestion and ear discharge.   Eyes: Negative for double vision and pain.  Respiratory: Negative for hemoptysis, sputum production and shortness of breath.   Cardiovascular: Negative for palpitations and orthopnea.  Gastrointestinal: Positive for abdominal pain and constipation.  Genitourinary: Negative for frequency and hematuria.  Musculoskeletal: Negative for back pain, joint pain and neck pain.  Neurological: Negative for tremors, sensory change, seizures and loss of consciousness.  Psychiatric/Behavioral: Negative for hallucinations and memory loss. The patient does not have insomnia.     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   Diverticulitis of large intestine with perforation 09/23/2012   Dyspnea 05/11/2014   GERD (gastroesophageal reflux disease)    Hemoptysis 06/23/2013   History of endometriosis 1997   Hx of tobacco use, presenting hazards to health 09/23/2012   Hyperglycemia 07/24/2013   Hypertension    Hypokalemia 07/20/2013   ILD (interstitial lung disease) ([place]) 01/22/2014   Leukocytosis 05/11/2014   MPA (microscopic polyangiitis) ([place]) 2015   Numbness    Obesity (BMI 30-39.9) 09/23/2012   Paresthesia 12/26/2016   Perforation of sigmoid colon - stercoral 09/27/2012   Physical deconditioning 04/30/2015   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   Respiratory failure with hypoxia ([place]) 06/23/2013   Right leg swelling 12/26/2016    Past Surgical History:  Procedure Laterality Date   APPENDECTOMY     BLADDER REPAIR     DILATATION & CURETTAGE/HYSTEROSCOPY WITH MYOSURE N/A 06/03/2019   Procedure: DILATATION & CURETTAGE/HYSTEROSCOPY WITH MYOSURE;  Surgeon: Everlene Farrier, MD;  Location: [facility];  Service: Gynecology;  Laterality: N/A;   LAPAROSCOPY  1997   dx of endometriosis   LAPAROSCOPY  04/28/2000    Laparoscopy with lysis of adhesions, hysteroscopy, D&C.   TONSILLECTOMY       reports that she quit smoking about 18 years ago.  She has a 27.00 pack-year smoking history. She has never used smokeless tobacco. She reports that she does not drink alcohol or use drugs.  Allergies  Allergen Reactions   Guaifenesin  Nausea And Vomiting and Other (See Comments)    un   Mucinex [Guaifenesin Er] Nausea And Vomiting   Sulfa Antibiotics Nausea And Vomiting    Family History  Problem Relation Age of Onset   Diabetes Mother        AODM   Diabetes Father        AODM   Hypertension Father    Obesity Father    Breast cancer Paternal Grandmother    Birth defects Cousin    CAD Other        [facility]    Prior to Admission medications   Medication Sig Start Date End Date Taking? Authorizing Provider  ALPRAZolam Duanne Moron) 1 MG tablet Take 1 tablet (1 mg total) by mouth 3 (three) times daily as needed for anxiety. 08/22/13   Blanchie Serve, MD  buPROPion (WELLBUTRIN XL) 150 MG 24 hr tablet Take 450 mg by mouth daily.    [provider]  carvedilol (COREG) 12.5 MG tablet TAKE 1 TABLET BY MOUTH TWICE A DAY Patient taking differently: Take 12.5 mg by mouth 2 (two) times daily with a meal.  02/26/19   Josue Hector, MD  citalopram (CELEXA) 40 MG tablet Take 40 mg by mouth daily.    [provider]  clobetasol cream (TEMOVATE) 1.91 % Apply 1 application topically 2 (two) times daily as needed (paronychia).  04/08/19   [provider]  dextromethorphan (DELSYM) 30 MG/5ML liquid Take 15 mg by mouth 2 (two) times daily as needed for cough.     [provider]  ENTRESTO 49-51 MG TAKE 1 TABLET BY MOUTH TWICE A DAY Patient taking differently: Take 1 tablet by mouth 2 (two) times daily.  02/26/19   Josue Hector, MD  esomeprazole (NEXIUM) 20 MG capsule Take 40 mg by mouth daily.     [provider]  Fluticasone-Salmeterol (ADVAIR) 100-50 MCG/DOSE AEPB Inhale 1 puff into the lungs 2 (two) times daily as needed (asthma).     [provider]  folic acid (FOLVITE) 1 MG tablet Take 1 mg by mouth daily.     [provider]  gabapentin (NEURONTIN) 400 MG capsule Take 400 mg by mouth at bedtime as needed (pain).  04/08/19   [provider]  meloxicam (MOBIC)  15 MG tablet Take 15 mg by mouth daily.  04/08/19   [provider]  metFORMIN (GLUCOPHAGE-XR) 500 MG 24 hr tablet Take 500 mg by mouth daily with breakfast.  10/03/17   [provider]  methotrexate (50 MG/ML) 1 g injection Inject into the vein once a week.     [provider]  Multiple Vitamin (MULTIVITAMIN) tablet Take 2 tablets by mouth daily.     [provider]  mycophenolate (CELLCEPT) 500 MG tablet Take 1,500 mg by mouth 2 (two) times daily.  06/05/17   [provider]  phentermine (ADIPEX-P) 37.5 MG tablet Take 37.5 mg by mouth daily. 04/06/18   [provider]  polyethylene glycol (MIRALAX / GLYCOLAX) packet Take 17 g by mouth daily as needed for moderate constipation (constipation).     [provider]    Physical Exam: Vitals:   06/11/19 1600 06/11/19 1615 06/11/19 1706 06/11/19 1730  BP: (!) 145/95 (!) 151/99 (!) 142/116   Pulse: (!) 109 (!) 110 Marland Kitchen)  112 (!) 114  Resp: (!) 22 (!) 26  (!) 23  Temp:      TempSrc:      SpO2: 94% 93% 98% 96%  Weight:      Height:        Constitutional: NAD, calm, comfortable Vitals:   06/11/19 1600 06/11/19 1615 06/11/19 1706 06/11/19 1730  BP: (!) 145/95 (!) 151/99 (!) 142/116   Pulse: (!) 109 (!) 110 (!) 112 (!) 114  Resp: (!) 22 (!) 26  (!) 23  Temp:      TempSrc:      SpO2: 94% 93% 98% 96%  Weight:      Height:       Eyes: PERRL, lids and conjunctivae normal ENMT: Mucous membranes are moist. Posterior pharynx clear of any exudate or lesions.Normal dentition.  Neck: normal, supple, no masses, no thyromegaly Respiratory: clear to auscultation bilaterally, no wheezing, no crackles. Normal respiratory effort. No accessory muscle use.  Cardiovascular: Regular rate and rhythm, no murmurs / rubs / gallops. No extremity edema. 2+ pedal pulses. No carotid bruits.  Abdomen: obese, markedly tender throughout Musculoskeletal: no clubbing / cyanosis. No joint deformity upper and lower  extremities. Good ROM, no contractures. Normal muscle tone.  Skin: no rashes, lesions, ulcers. No induration Neurologic: CN 2-12 grossly intact. Sensation intact, DTR normal. Strength 5/5 in all 4.  Psychiatric: Normal judgment and insight. Alert and oriented x 3. Normal mood.    Labs on Admission: I have personally reviewed following labs and imaging studies  CBC: Recent Labs  Lab 06/11/19 1243  WBC 15.3*  NEUTROABS 13.4*  HGB 13.1  HCT 43.0  MCV 87.2  PLT 924   Basic Metabolic Panel: Recent Labs  Lab 06/11/19 1243  NA 139  K 4.4  CL 102  CO2 25  GLUCOSE 159*  BUN 19  CREATININE 1.03*  CALCIUM 8.9   GFR: Estimated Creatinine Clearance: 102.2 mL/min (A) (by C-G formula based on SCr of 1.03 mg/dL (H)). Liver Function Tests: Recent Labs  Lab 06/11/19 1243  AST 14*  ALT 15  ALKPHOS 107  BILITOT 0.8  PROT 6.9  ALBUMIN 3.5   Recent Labs  Lab 06/11/19 1243  LIPASE 15   No results for input(s): AMMONIA in the last 168 hours. Coagulation Profile: No results for input(s): INR, PROTIME in the last 168 hours. Cardiac Enzymes: No results for input(s): CKTOTAL, CKMB, CKMBINDEX, TROPONINI in the last 168 hours. BNP (last 3 results) No results for input(s): PROBNP in the last 8760 hours. HbA1C: No results for input(s): HGBA1C in the last 72 hours. CBG: No results for input(s): GLUCAP in the last 168 hours. Lipid Profile: No results for input(s): CHOL, HDL, LDLCALC, TRIG, CHOLHDL, LDLDIRECT in the last 72 hours. Thyroid Function Tests: No results for input(s): TSH, T4TOTAL, FREET4, T3FREE, THYROIDAB in the last 72 hours. Anemia Panel: No results for input(s): VITAMINB12, FOLATE, FERRITIN, TIBC, IRON, RETICCTPCT in the last 72 hours. Urine analysis:    Component Value Date/Time   COLORURINE YELLOW 07/09/2013 1220   APPEARANCEUR CLEAR 07/09/2013 1220   LABSPEC 1.009 07/09/2013 1220   PHURINE 6.5 07/09/2013 1220   GLUCOSEU NEGATIVE 07/09/2013 1220   HGBUR  MODERATE (A) 07/09/2013 1220   BILIRUBINUR NEGATIVE 07/09/2013 1220   KETONESUR NEGATIVE 07/09/2013 1220   PROTEINUR NEGATIVE 07/09/2013 1220   UROBILINOGEN 0.2 07/09/2013 1220   NITRITE NEGATIVE 07/09/2013 1220   LEUKOCYTESUR NEGATIVE 07/09/2013 1220   Sepsis Labs: !!!!!!!!!!!!!!!!!!!!!!!!!!!!!!!!!!!!!!!!!!!! @LABRCNTIP (procalcitonin:4,lacticidven:4) )No results found for this or  any previous visit (from the past 240 hour(s)).   Radiological Exams on Admission: DG Chest 1 View  Result Date: 06/11/2019 CLINICAL DATA:  59 year old female with history of shortness of breath. Abdominal cramping. EXAM: CHEST  1 VIEW COMPARISON:  Chest x-ray 05/23/2019. FINDINGS: Lung volumes are normal. No consolidative airspace disease. No pleural effusions. No pneumothorax. No pulmonary nodule or mass noted. Pulmonary vasculature and the cardiomediastinal silhouette are within normal limits. IMPRESSION: No radiographic evidence of acute cardiopulmonary disease. Electronically Signed   By: Vinnie Langton M.D.   On: 06/11/2019 17:25   CT ABDOMEN PELVIS W CONTRAST  Result Date: 06/11/2019 CLINICAL DATA:  Abdominal pain EXAM: CT ABDOMEN AND PELVIS WITH CONTRAST TECHNIQUE: Multidetector CT imaging of the abdomen and pelvis was performed using the standard protocol following bolus administration of intravenous contrast. CONTRAST:  154mL OMNIPAQUE IOHEXOL 300 MG/ML  SOLN COMPARISON:  2014 FINDINGS: Lower chest: No acute abnormality. Hepatobiliary: Possible hepatic steatosis. Gallbladder is unremarkable Pancreas: Unremarkable. Spleen: Unremarkable. Adrenals/Urinary Tract: Adrenals, kidneys, and bladder are unremarkable. Stomach/Bowel: Stomach is within normal limits. Bowel is normal in caliber. There are inflammatory changes about the sigmoid colon. Sigmoid diverticulosis is present. Appendix is not definitely visualized. Vascular/Lymphatic: No significant vascular findings are present. No enlarged abdominal or pelvic lymph  nodes. Reproductive: Uterus and bilateral adnexa are unremarkable. Other: Small volume free fluid.  Abdominal wall is unremarkable. Musculoskeletal: Lumbar spine degenerative changes. There is no acute osseous abnormality. IMPRESSION: Acute sigmoid diverticulitis. Small volume free fluid no evidence of abscess. Electronically Signed   By: Macy Mis M.D.   On: 06/11/2019 17:05    EKG: Independently reviewed. Sinus QTc of 427  Assessment/Plan Principal Problem:   Acute diverticulitis Active Problems:   Obesity (BMI 30-39.9)   ILD (interstitial lung disease) ([place])   Hypertension   1. Acute sigmoid diverticulitis with sepsis present on admit 1. Pt with leukocytosis, tachycardia, tachypnea 2. CT abd personally reviewed. Findings of acute sigmoid diverticulitis 3. Pain is very poorly controlled. Will continue on PRN dilaudid as tolerated 4. Will keep NPO with sips with meds to allow bowel rest 5. Zosyn started in the ED, will continue 2. DM2 1. Random glucose in the 150's 2. Recent a1c of 7.0 3. Will hold oral hyperglycemic regimen while in hospital 4. Continue on SSI coverage as needed 3. Morbid obesity 1. Recommend diet/lifestyle modification 4. HTN 1. Cont home meds as tolerated 2. BP stable at present 3. Will cont home meds as tolerated 5. 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 Had been on cellcept. Given acute infection, will hold 2. Stable at present 3. Currently on minimal O2 support 6. Hx microscopic polyangiitis 1. Followed by Rheum at [facility] 2. Historically required high dosed steroids, rituximab, plasmapheresis, and ultimately cellcept 3. Given acute infection, will hold cellcept at this time  DVT prophylaxis: Lovenox subq  Code Status: Full Family Communication: Pt in room, family at bedside  Disposition Plan:  Uncertain at this time Consults called:  Admission status: Inpatient as pt will require IVF and IV antibiotic to treat acute diverticulitis    Marylu Lund MD [facility] Pager On Amion  If 7PM-7AM, please contact night-coverage  06/11/2019, 5:54 PM

## 2019-06-11 NOTE — ED Provider Notes (Signed)
[facility] EMERGENCY DEPARTMENT Provider Note   CSN: [id number] Arrival date & time: 06/11/19  1116     History No chief complaint on file.   [name] is a 59 y.o. female.  HPI 59 year old female with extensive medical history including GERD, DM type II, obesity, diverticulitis with perforation 2014, endometriosis, hypertension presents abdominal pain, cramping, nausea.  Patient reports that she had a D&C done on 06/03/2019, states that she has had a history of constipation and intermittent cramping, which got worse after the procedure.  She states she was given 2 days worth of opioid medication which she took.  She states she has had 2 small painful bowel movements in the last week.  She has been taking MiraLAX with little relief.  She states this morning she had a very sudden onset of sharp pain which started in her right lower quadrant but now travels across her pelvis and up her right side of the abdomen.  She states she has never experienced pain like this before.  States that she is having difficulty catching a breath because of the pain.  She denies any fevers, chills, vomiting, chest pain, back pain, vaginal pain, vaginal bleeding, dysuria, hematuria, hematochezia.    Past Medical History:  Diagnosis Date  . Acute blood loss anemia 06/23/2013  . 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 Anxiety associated with depression 09/23/2012  . At high risk for falls 09/23/2012   She has had a fractured ankle and tendon tear with falls over the last couple years.  . Cardiomyopathy ([place])   . Chronic fatigue 05/11/2014  . Chronic right-sided low back pain with right-sided sciatica 12/26/2016  . Colitis   . Constipation, chronic 09/27/2012  . Diabetes mellitus without complication ([place])   . 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 Diverticulitis of large intestine with perforation 09/23/2012  . Dyspnea 05/11/2014  . GERD  (gastroesophageal reflux disease)   . Hemoptysis 06/23/2013  . History of endometriosis 1997  . Hx of tobacco use, presenting hazards to health 09/23/2012  . Hyperglycemia 07/24/2013  . Hypertension   . Hypokalemia 07/20/2013  . ILD (interstitial lung disease) ([place]) 01/22/2014  . Leukocytosis 05/11/2014  . MPA (microscopic polyangiitis) ([place]) 2015  . Numbness   . Obesity (BMI 30-39.9) 09/23/2012  . Paresthesia 12/26/2016  . Perforation of sigmoid colon - stercoral 09/27/2012  . Physical deconditioning 04/30/2015  . 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  . Respiratory failure with hypoxia ([place]) 06/23/2013  . Right leg swelling 12/26/2016    Patient Active Problem List   Diagnosis Date Noted  . Acute diverticulitis 06/11/2019  . Preoperative clearance 05/23/2019  . Hypertension 12/25/2017  . Nonintractable headache 11/29/2017  . Cardiac LV ejection fraction of 35-39% 11/26/2017  . Medication management 11/26/2017  . Chronic low back pain 10/16/2017  . Pseudotumor cerebri 10/15/2017  . Chronic right-sided low back pain with right-sided sciatica 12/26/2016  . Paresthesia 12/26/2016  . Right leg swelling 12/26/2016  . Physical deconditioning 04/30/2015  . Dyspnea 05/11/2014  . Leukocytosis 05/11/2014  . Chronic fatigue 05/11/2014  . Morbid obesity ([place]) 01/22/2014  . ILD (interstitial lung disease) ([place]) 01/22/2014  . Acute bronchitis 01/08/2014  . Chronic respiratory failure with hypoxia ([place]) 11/04/2013  . 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  . Microscopic polyangiitis ([place]) 08/08/2013  . Hyperglycemia 07/24/2013  . GERD (gastroesophageal reflux disease) 07/24/2013  . Hypokalemia 07/20/2013  . 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  Hemoptysis 06/23/2013  . Acute blood loss anemia 06/23/2013  . Respiratory failure with hypoxia ([place]) 06/23/2013  . 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 Perforation of sigmoid  colon - stercoral 09/27/2012  . Constipation, chronic 09/27/2012  . Obesity (BMI 30-39.9) 09/23/2012  . Anxiety associated with depression 09/23/2012  . Hx of tobacco use, presenting hazards to health 09/23/2012  . H/O gastroesophageal reflux (GERD) 09/23/2012  . At high risk for falls 09/23/2012    Past Surgical History:  Procedure Laterality Date  . APPENDECTOMY    . BLADDER REPAIR    . DILATATION & CURETTAGE/HYSTEROSCOPY WITH MYOSURE N/A 06/03/2019   Procedure: DILATATION & CURETTAGE/HYSTEROSCOPY WITH MYOSURE;  Surgeon: Everlene Farrier, MD;  Location: [facility];  Service: Gynecology;  Laterality: N/A;  . LAPAROSCOPY  1997   dx of endometriosis  . LAPAROSCOPY  04/28/2000    Laparoscopy with lysis of adhesions, hysteroscopy, D&C.  Marland Kitchen TONSILLECTOMY       OB History   No obstetric history on file.     Family History  Problem Relation Age of Onset  . Diabetes Mother        AODM  . Diabetes Father        AODM  . Hypertension Father   . Obesity Father   . Breast cancer Paternal Grandmother   . Birth defects Cousin   . CAD Other        [facility]    Social History   Tobacco Use  . Smoking status: Former Smoker    Packs/day: 1.00    Years: 27.00    Pack years: 27.00    Quit date: 01/09/2001    Years since quitting: 18.4  . Smokeless tobacco: Never Used  Substance Use Topics  . Alcohol use: No  . Drug use: No    Home Medications Prior to Admission medications   Medication Sig Start Date End Date Taking? Authorizing Provider  ALPRAZolam Duanne Moron) 1 MG tablet Take 1 tablet (1 mg total) by mouth 3 (three) times daily as needed for anxiety. 08/22/13   Blanchie Serve, MD  buPROPion (WELLBUTRIN XL) 150 MG 24 hr tablet Take 450 mg by mouth daily.    [provider]  carvedilol (COREG) 12.5 MG tablet TAKE 1 TABLET BY MOUTH TWICE A DAY Patient taking differently: Take 12.5 mg by mouth 2 (two) times daily with a meal.  02/26/19   Josue Hector, MD  citalopram (CELEXA) 40 MG  tablet Take 40 mg by mouth daily.    [provider]  clobetasol cream (TEMOVATE) 9.62 % Apply 1 application topically 2 (two) times daily as needed (paronychia).  04/08/19   [provider]  dextromethorphan (DELSYM) 30 MG/5ML liquid Take 15 mg by mouth 2 (two) times daily as needed for cough.     [provider]  ENTRESTO 49-51 MG TAKE 1 TABLET BY MOUTH TWICE A DAY Patient taking differently: Take 1 tablet by mouth 2 (two) times daily.  02/26/19   Josue Hector, MD  esomeprazole (NEXIUM) 20 MG capsule Take 40 mg by mouth daily.     [provider]  Fluticasone-Salmeterol (ADVAIR) 100-50 MCG/DOSE AEPB Inhale 1 puff into the lungs 2 (two) times daily as needed (asthma).     [provider]  folic acid (FOLVITE) 1 MG tablet Take 1 mg by mouth daily.     [provider]  gabapentin (NEURONTIN) 400 MG capsule Take 400 mg by mouth at bedtime as needed (pain).  04/08/19  [provider]  meloxicam (MOBIC) 15 MG tablet Take 15 mg by mouth daily.  04/08/19   [provider]  metFORMIN (GLUCOPHAGE-XR) 500 MG 24 hr tablet Take 500 mg by mouth daily with breakfast.  10/03/17   [provider]  methotrexate (50 MG/ML) 1 g injection Inject into the vein once a week.     [provider]  Multiple Vitamin (MULTIVITAMIN) tablet Take 2 tablets by mouth daily.     [provider]  mycophenolate (CELLCEPT) 500 MG tablet Take 1,500 mg by mouth 2 (two) times daily.  06/05/17   [provider]  phentermine (ADIPEX-P) 37.5 MG tablet Take 37.5 mg by mouth daily. 04/06/18   [provider]  polyethylene glycol (MIRALAX / GLYCOLAX) packet Take 17 g by mouth daily as needed for moderate constipation (constipation).     [provider]    Allergies    Guaifenesin, Mucinex [guaifenesin er], and Sulfa antibiotics  Review of Systems   Review of Systems  Constitutional: Negative for chills and fever.    HENT: Negative for ear pain and sore throat.   Eyes: Negative for pain and visual disturbance.  Respiratory: Positive for shortness of breath. Negative for cough.   Cardiovascular: Negative for chest pain and palpitations.  Gastrointestinal: Positive for abdominal pain and constipation. Negative for vomiting.  Genitourinary: Negative for dysuria, hematuria, pelvic pain, vaginal bleeding, vaginal discharge and vaginal pain.  Musculoskeletal: Negative for arthralgias and back pain.  Skin: Negative for color change and rash.  Neurological: Negative for dizziness, seizures, syncope, weakness and headaches.  Psychiatric/Behavioral: Negative for confusion.  All other systems reviewed and are negative.   Physical Exam Updated Vital Signs BP (!) 142/116   Pulse (!) 114   Temp 97.7 F (36.5 C) (Oral)   Resp (!) 23   Ht 5' 8.5" (1.74 m)   Wt (!) 174.2 kg   SpO2 96%   BMI 57.54 kg/m   Physical Exam Vitals and nursing note reviewed.  Constitutional:      General: She is not in acute distress.    Appearance: She is well-developed.  HENT:     Head: Normocephalic and atraumatic.  Eyes:     Conjunctiva/sclera: Conjunctivae normal.  Cardiovascular:     Rate and Rhythm: Normal rate and regular rhythm.     Heart sounds: No murmur.  Pulmonary:     Effort: Pulmonary effort is normal. No respiratory distress.     Breath sounds: Normal breath sounds.  Abdominal:     General: Abdomen is flat. Bowel sounds are normal.     Palpations: Abdomen is soft.     Tenderness: There is generalized abdominal tenderness and tenderness in the right upper quadrant, right lower quadrant and left lower quadrant. There is no right CVA tenderness or left CVA tenderness. Negative signs include Murphy's sign and McBurney's sign.     Hernia: No hernia is present.  Musculoskeletal:     Cervical back: Neck supple.  Skin:    General: Skin is warm and dry.  Neurological:     Mental Status: She is alert.     ED  Results / Procedures / Treatments   Labs (all labs ordered are listed, but only abnormal results are displayed) Labs Reviewed  CBC WITH DIFFERENTIAL/PLATELET - Abnormal; Notable for the following components:      Result Value   WBC 15.3 (*)    Neutro Abs 13.4 (*)    All other components within normal limits  COMPREHENSIVE METABOLIC PANEL - Abnormal; Notable for the following components:   Glucose, Bld 159 (*)    Creatinine, Ser 1.03 (*)    AST 14 (*)    GFR calc non Af Amer 60 (*)    All other components within normal limits  SARS CORONAVIRUS 2 BY RT PCR (HOSPITAL ORDER, [facility])  LIPASE, BLOOD  URINALYSIS, ROUTINE W REFLEX MICROSCOPIC    EKG None  Radiology DG Chest 1 View  Result Date: 06/11/2019 CLINICAL DATA:  59 year old female with history of shortness of breath. Abdominal cramping. EXAM: CHEST  1 VIEW COMPARISON:  Chest x-ray 05/23/2019. FINDINGS: Lung volumes are normal. No consolidative airspace disease. No pleural effusions. No pneumothorax. No pulmonary nodule or mass noted. Pulmonary vasculature and the cardiomediastinal silhouette are within normal limits. IMPRESSION: No radiographic evidence of acute cardiopulmonary disease. Electronically Signed   By: Vinnie Langton M.D.   On: 06/11/2019 17:25   CT ABDOMEN PELVIS W CONTRAST  Result Date: 06/11/2019 CLINICAL DATA:  Abdominal pain EXAM: CT ABDOMEN AND PELVIS WITH CONTRAST TECHNIQUE: Multidetector CT imaging of the abdomen and pelvis was performed using the standard protocol following bolus administration of intravenous contrast. CONTRAST:  146mL OMNIPAQUE IOHEXOL 300 MG/ML  SOLN COMPARISON:  2014 FINDINGS: Lower chest: No acute abnormality. Hepatobiliary: Possible hepatic steatosis. Gallbladder is unremarkable Pancreas: Unremarkable. Spleen: Unremarkable. Adrenals/Urinary Tract: Adrenals, kidneys, and bladder are unremarkable. Stomach/Bowel: Stomach is within normal limits. Bowel is normal in  caliber. There are inflammatory changes about the sigmoid colon. Sigmoid diverticulosis is present. Appendix is not definitely visualized. Vascular/Lymphatic: No significant vascular findings are present. No enlarged abdominal or pelvic lymph nodes. Reproductive: Uterus and bilateral adnexa are unremarkable. Other: Small volume free fluid.  Abdominal wall is unremarkable. Musculoskeletal: Lumbar spine degenerative changes. There is no acute osseous abnormality. IMPRESSION: Acute sigmoid diverticulitis. Small volume free fluid no evidence of abscess. Electronically Signed   By: Macy Mis M.D.   On: 06/11/2019 17:05    Procedures Procedures (including critical care time)  Medications Ordered in ED Medications  piperacillin-tazobactam (ZOSYN) IVPB 3.375 g (3.375 g Intravenous New Bag/Given 06/11/19 1740)  morphine 2 MG/ML injection 2 mg (2 mg Intravenous Given 06/11/19 1304)  ondansetron (ZOFRAN) injection 4 mg (4 mg Intravenous Given 06/11/19 1302)  sodium chloride 0.9 % bolus 1,000 mL (1,000 mLs Intravenous New Bag/Given 06/11/19 1305)  iohexol (OMNIPAQUE) 300 MG/ML solution 100 mL (100 mLs Intravenous Contrast Given 06/11/19 1630)  morphine 2 MG/ML injection 2 mg (2 mg Intravenous Given 06/11/19 1707)    ED Course  I have reviewed the triage vital signs and the nursing notes.  Pertinent labs & imaging results that were available during my care of the patient were reviewed by me and considered in my medical decision making (see chart for details).    MDM Rules/Calculators/A&P                     59 year old female with abdominal cramping and pain after D&C on 06/03/2019, worse today.   On presentation to the ER, the patient is moaning and anxious, states that she needs something for pain this incident.  Vitals are overall reassuring, she is afebrile, though mildly hypertensive.  Physical exam generalized abdominal tenderness.  She denies any vaginal bleeding, pain.  We will start with basic lab work,  CT of the abdomen to rule out small bowel obstruction.  Depending on the results of the CT, we may need to do a  pelvic exam.  We will treat with fluids, Zofran and pain medication.  5:24 PM: CT of the abdomen consistent with acute sigmoid diverticulitis.  Patient has had 4 mg of morphine, still continues to complain of severe pain.  I have started Zosyn, but given her complex medical history, white count above 15 and poorly controlled pain, will consult hospitalist team for admission.  6:05 PM: Consult with Dr. Sherrian Divers with the hospitalist team, she will be admitted for further pain management and treatment of her diverticulitis.  Final Clinical Impression(s) / ED Diagnoses Final diagnoses:  Sigmoid diverticulitis    Rx / DC Orders ED Discharge Orders    None       Garald Balding, PA-C 06/11/19 1854    Sherwood Gambler, MD 06/14/19 [id number]

## 2019-06-11 NOTE — Progress Notes (Signed)
   06/11/19 2104  Assess: MEWS Score  Temp (!) 101.2 F (38.4 C) (rn notified)  BP 138/80  Pulse Rate (!) 115 (rn notified)  Resp 20  SpO2 93 %  O2 Device Room Air  Assess: MEWS Score  MEWS Temp 1  MEWS Systolic 0  MEWS Pulse 2  MEWS RR 0  MEWS LOC 0  MEWS Score 3  MEWS Score Color Yellow  Assess: if the MEWS score is Yellow or Red  Were vital signs taken at a resting state? Yes  Focused Assessment Documented focused assessment  Early Detection of Sepsis Score *See Row Information* Medium  MEWS guidelines implemented *See Row Information* Yes  Treat  MEWS Interventions Administered prn meds/treatments  Take Vital Signs  Increase Vital Sign Frequency  Yellow: Q 2hr X 2 then Q 4hr X 2, if remains yellow, continue Q 4hrs  Escalate  MEWS: Escalate Yellow: discuss with charge nurse/RN and consider discussing with provider and RRT  Notify: Charge Nurse/RN  Name of Charge Nurse/RN Notified Josephine  Date Charge Nurse/RN Notified 06/11/19  Time Charge Nurse/RN Notified 2115  Notify: Provider  Provider Name/Title M. Sharlet Salina, NP  Date Provider Notified 06/11/19  Time Provider Notified 2135  Notification Type Page  Notification Reason Change in status  Response No new orders

## 2019-06-11 NOTE — ED Notes (Signed)
Pt transported to CT ?

## 2019-06-12 ENCOUNTER — Encounter (HOSPITAL_COMMUNITY): Payer: Self-pay | Admitting: Internal Medicine

## 2019-06-12 ENCOUNTER — Inpatient Hospital Stay (HOSPITAL_COMMUNITY): Payer: Commercial Managed Care - PPO

## 2019-06-12 DIAGNOSIS — E669 Obesity, unspecified: Secondary | ICD-10-CM

## 2019-06-12 DIAGNOSIS — R188 Other ascites: Secondary | ICD-10-CM

## 2019-06-12 LAB — GLUCOSE, CAPILLARY
Glucose-Capillary: 107 mg/dL — ABNORMAL HIGH (ref 70–99)
Glucose-Capillary: 111 mg/dL — ABNORMAL HIGH (ref 70–99)
Glucose-Capillary: 120 mg/dL — ABNORMAL HIGH (ref 70–99)
Glucose-Capillary: 132 mg/dL — ABNORMAL HIGH (ref 70–99)
Glucose-Capillary: 135 mg/dL — ABNORMAL HIGH (ref 70–99)
Glucose-Capillary: 147 mg/dL — ABNORMAL HIGH (ref 70–99)
Glucose-Capillary: 153 mg/dL — ABNORMAL HIGH (ref 70–99)

## 2019-06-12 LAB — COMPREHENSIVE METABOLIC PANEL
ALT: 12 U/L (ref 0–44)
AST: 14 U/L — ABNORMAL LOW (ref 15–41)
Albumin: 2.9 g/dL — ABNORMAL LOW (ref 3.5–5.0)
Alkaline Phosphatase: 89 U/L (ref 38–126)
Anion gap: 10 (ref 5–15)
BUN: 19 mg/dL (ref 6–20)
CO2: 23 mmol/L (ref 22–32)
Calcium: 8.2 mg/dL — ABNORMAL LOW (ref 8.9–10.3)
Chloride: 107 mmol/L (ref 98–111)
Creatinine, Ser: 1.66 mg/dL — ABNORMAL HIGH (ref 0.44–1.00)
GFR calc Af Amer: 39 mL/min — ABNORMAL LOW (ref >60)
GFR calc non Af Amer: 34 mL/min — ABNORMAL LOW (ref >60)
Glucose, Bld: 145 mg/dL — ABNORMAL HIGH (ref 70–99)
Potassium: 4.2 mmol/L (ref 3.5–5.1)
Sodium: 140 mmol/L (ref 135–145)
Total Bilirubin: 1.4 mg/dL — ABNORMAL HIGH (ref 0.3–1.2)
Total Protein: 5.9 g/dL — ABNORMAL LOW (ref 6.5–8.1)

## 2019-06-12 LAB — CBC
HCT: 38.6 % (ref 36.0–46.0)
Hemoglobin: 11.6 g/dL — ABNORMAL LOW (ref 12.0–15.0)
MCH: 26.4 pg (ref 26.0–34.0)
MCHC: 30.1 g/dL (ref 30.0–36.0)
MCV: 87.9 fL (ref 80.0–100.0)
Platelets: 333 10*3/uL (ref 150–400)
RBC: 4.39 MIL/uL (ref 3.87–5.11)
RDW: 14.4 % (ref 11.5–15.5)
WBC: 24.4 10*3/uL — ABNORMAL HIGH (ref 4.0–10.5)
nRBC: 0 % (ref 0.0–0.2)

## 2019-06-12 LAB — SEDIMENTATION RATE: Sed Rate: 55 mm/hr — ABNORMAL HIGH (ref 0–22)

## 2019-06-12 MED ORDER — METOPROLOL TARTRATE 5 MG/5ML IV SOLN
2.5000 mg | Freq: Four times a day (QID) | INTRAVENOUS | Status: DC
  Administered 2019-06-12 – 2019-06-13 (×4): 2.5 mg via INTRAVENOUS
  Filled 2019-06-12 (×4): qty 5

## 2019-06-12 MED ORDER — SODIUM CHLORIDE 0.9 % IV BOLUS: 500.0000 mL | Freq: Once | INTRAVENOUS | Status: DC

## 2019-06-12 MED ORDER — CARVEDILOL 3.125 MG PO TABS
3.1250 mg | ORAL_TABLET | Freq: Two times a day (BID) | ORAL | Status: DC
  Filled 2019-06-12: qty 1

## 2019-06-12 MED ORDER — LORAZEPAM 2 MG/ML IJ SOLN
0.5000 mg | Freq: Four times a day (QID) | INTRAMUSCULAR | Status: DC | PRN
  Administered 2019-06-18 – 2019-06-29 (×7): 0.5 mg via INTRAVENOUS
  Filled 2019-06-12 (×7): qty 1

## 2019-06-12 MED ORDER — PANTOPRAZOLE SODIUM 40 MG IV SOLR
40.0000 mg | INTRAVENOUS | Status: DC
  Administered 2019-06-12 – 2019-06-19 (×8): 40 mg via INTRAVENOUS
  Filled 2019-06-12 (×8): qty 40

## 2019-06-12 MED ORDER — DOCUSATE SODIUM 100 MG PO CAPS: 100.0000 mg | ORAL_CAPSULE | Freq: Two times a day (BID) | ORAL | Status: DC | PRN

## 2019-06-12 MED ORDER — ALBUMIN HUMAN 5 % IV SOLN
12.5000 g | Freq: Once | INTRAVENOUS | Status: AC
  Administered 2019-06-12: 12.5 g via INTRAVENOUS
  Filled 2019-06-12: qty 250

## 2019-06-12 NOTE — Progress Notes (Signed)
PROGRESS NOTE    [name]  YPP:[id number] DOB: August 08, 1960 DOA: 06/11/2019 PCP: Christain Sacramento, MD    Brief Narrative:  59 y.o. 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 Pt reportedly underwent D/C procedure on 5/25. Very shortly after the procedure, pt recalled abdominal "swelling" with subsequent abd pain. Pt believed herself to be constipated, thus was prescribed miralax. Pt had several BM without resolution of symptoms. Symptoms progressed and worsened, prompting visit to ED  ED Course: In the ED, pt was found to have WBC of 15k with HR of 110's with RR in the 20's. CT abd/pelvis was performed, which was notable for sigmoid diverticulitis. Pt was started on zosyn. Hospitalist consulted for consideration for admission.  Assessment & Plan:   Principal Problem:   Acute diverticulitis Active Problems:   Obesity (BMI 30-39.9)   ILD (interstitial lung disease) ([place])   Hypertension  1. Acute sigmoid diverticulitis with sepsis present on admit 1. Pt with leukocytosis, tachycardia, tachypnea 2. Initial CT abd personally reviewed. Findings of acute sigmoid diverticulitis without perforation 3. Pain remained poorly controlled with rising WBC 4. Given immunosuppressed state, have asked ID to follow 5. Given worsening symptoms, repeat CT obtained and reviewed with Radiologist. Findings worrisome for new perforation without abscess collection 6. Consulted General Surgery who recommends IR consult for possible drain placement 7. Repeat CBC in AM 2. DM2 1. Random glucose in the 150's 2. Recent a1c of 7.0 3. Holding oral hyperglycemic regimen while in hospital 4. Continue on SSI coverage as needed 3. Morbid obesity 1. Recommend diet/lifestyle  modification 4. HTN 1. Noted to be mildly hypotensive overnight 2. Stopped entresto 3. Transitioned to low dose IV lopressor with hold parameters 5. 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 Had been on cellcept. Given acute infection, will continue to hold 2. Seems to be stable at present 3. Currently on minimal O2 support 6. Hx microscopic polyangiitis 1. Followed by Rheum at [facility] 2. Historically required high dosed steroids, rituximab, plasmapheresis in the past and ultimately is continued on cellcept 3. Given acute infection, will hold cellcept at this time  DVT prophylaxis: Lovenox subq Code Status: Full Family Communication: Pt in room, family at bedside  Status is: Inpatient  Remains inpatient appropriate because:Unsafe d/c plan, IV treatments appropriate due to intensity of illness or inability to take PO and Inpatient level of care appropriate due to severity of illness   Dispo: The patient is from: Home              Anticipated d/c is to: Home              Anticipated d/c date is: > 3 days              Patient currently is not medically stable to d/c.       Consultants:   ID  General Surgery  Procedures:     Antimicrobials: Anti-infectives (From admission, onward)   Start     Dose/Rate Route Frequency Ordered Stop   06/11/19 2330  piperacillin-tazobactam (ZOSYN) IVPB 3.375 g     3.375 g 12.5 mL/hr over 240 Minutes Intravenous Every 8 hours 06/11/19 1852     06/11/19 1715  piperacillin-tazobactam (ZOSYN) IVPB 3.375 g     3.375 g 100 mL/hr over  30 Minutes Intravenous  Once 06/11/19 1714 06/11/19 1942       Subjective: Complaining of worse abd pain and distension. Complaining of feeling constipated  Objective: Vitals:   06/12/19 0123 06/12/19 0159 06/12/19 0452 06/12/19 0832  BP: (!) 79/58 (!) 91/55 98/69 94/84   Pulse: 76 77 81 78  Resp: 18  18 20   Temp: 98.1 F (36.7 C)  98.5 F (36.9 C) 97.9 F (36.6 C)  TempSrc:   Oral Oral  SpO2:  95%  95% 97%  Weight:      Height:        Intake/Output Summary (Last 24 hours) at 06/12/2019 1627 Last data filed at 06/12/2019 7902 Gross per 24 hour  Intake 1927.39 ml  Output 350 ml  Net 1577.39 ml   Filed Weights   06/11/19 1128  Weight: (!) 174.2 kg    Examination: General exam: Awake, laying in bed, in nad Respiratory system: Normal respiratory effort, no wheezing Cardiovascular system: regular rate, s1, s2 Gastrointestinal system: mildly distended, obese, generally tender in all quadrants Central nervous system: CN2-12 grossly intact, strength intact Extremities: Perfused, no clubbing Skin: Normal skin turgor, no notable skin lesions seen Psychiatry: Mood normal // no visual hallucinations   Data Reviewed: I have personally reviewed following labs and imaging studies  CBC: Recent Labs  Lab 06/11/19 1243 06/11/19 2020 06/12/19 0209  WBC 15.3* 21.5* 24.4*  NEUTROABS 13.4*  --   --   HGB 13.1 12.7 11.6*  HCT 43.0 41.9 38.6  MCV 87.2 87.3 87.9  PLT 333 308 409   Basic Metabolic Panel: Recent Labs  Lab 06/11/19 1243 06/11/19 2020 06/12/19 0209  NA 139  --  140  K 4.4  --  4.2  CL 102  --  107  CO2 25  --  23  GLUCOSE 159*  --  145*  BUN 19  --  19  CREATININE 1.03* 0.98 1.66*  CALCIUM 8.9  --  8.2*   GFR: Estimated Creatinine Clearance: 63 mL/min (A) (by C-G formula based on SCr of 1.66 mg/dL (H)). Liver Function Tests: Recent Labs  Lab 06/11/19 1243 06/12/19 0209  AST 14* 14*  ALT 15 12  ALKPHOS 107 89  BILITOT 0.8 1.4*  PROT 6.9 5.9*  ALBUMIN 3.5 2.9*   Recent Labs  Lab 06/11/19 1243  LIPASE 15   No results for input(s): AMMONIA in the last 168 hours. Coagulation Profile: No results for input(s): INR, PROTIME in the last 168 hours. Cardiac Enzymes: No results for input(s): CKTOTAL, CKMB, CKMBINDEX, TROPONINI in the last 168 hours. BNP (last 3 results) No results for input(s): PROBNP in the last 8760 hours. HbA1C: No results for  input(s): HGBA1C in the last 72 hours. CBG: Recent Labs  Lab 06/11/19 2103 06/12/19 0011 06/12/19 0446 06/12/19 0822 06/12/19 1241  GLUCAP 181* 153* 147* 135* 132*   Lipid Profile: No results for input(s): CHOL, HDL, LDLCALC, TRIG, CHOLHDL, LDLDIRECT in the last 72 hours. Thyroid Function Tests: No results for input(s): TSH, T4TOTAL, FREET4, T3FREE, THYROIDAB in the last 72 hours. Anemia Panel: No results for input(s): VITAMINB12, FOLATE, FERRITIN, TIBC, IRON, RETICCTPCT in the last 72 hours. Sepsis Labs: No results for input(s): PROCALCITON, LATICACIDVEN in the last 168 hours.  Recent Results (from the past 240 hour(s))  SARS Coronavirus 2 by RT PCR (hospital order, performed in [facility] hospital lab) Nasopharyngeal Nasopharyngeal Swab     Status: None   Collection Time: 06/11/19  6:16 PM   Specimen: Nasopharyngeal  Swab  Result Value Ref Range Status   SARS Coronavirus 2 NEGATIVE NEGATIVE Final    Comment: (NOTE) SARS-CoV-2 target nucleic acids are NOT DETECTED. The SARS-CoV-2 RNA is generally detectable in upper and lower respiratory specimens during the acute phase of infection. The lowest concentration of SARS-CoV-2 viral copies this assay can detect is 250 copies / mL. A negative result does not preclude SARS-CoV-2 infection and should not be used as the sole basis for treatment or other patient management decisions.  A negative result may occur with improper specimen collection / handling, submission of specimen other than nasopharyngeal swab, presence of viral mutation(s) within the areas targeted by this assay, and inadequate number of viral copies (<250 copies / mL). A negative result must be combined with clinical observations, patient history, and epidemiological information. Fact Sheet for Patients:   StrictlyIdeas.no Fact Sheet for Healthcare Providers: BankingDealers.co.za This test is not yet approved or  cleared  by the [place] FDA and has been authorized for detection and/or diagnosis of SARS-CoV-2 by FDA under an Emergency Use Authorization (EUA).  This EUA will remain in effect (meaning this test can be used) for the duration of the COVID-19 declaration under Section 564(b)(1) of the Act, 21 U.S.C. section 360bbb-3(b)(1), unless the authorization is terminated or revoked sooner. Performed at [facility], [place] [address] [place], [place] [address]   Culture, blood (Routine X 2) w Reflex to ID Panel     Status: None (Preliminary result)   Collection Time: 06/11/19  8:20 PM   Specimen: BLOOD  Result Value Ref Range Status   Specimen Description BLOOD BLOOD LEFT FOREARM  Final   Special Requests   Final    BOTTLES DRAWN AEROBIC AND ANAEROBIC Blood Culture results may not be optimal due to an inadequate volume of blood received in culture bottles   Culture   Final    NO GROWTH < 12 HOURS Performed at [facility], [place] [address], [place], [place] [address]    Report Status PENDING  Incomplete  Culture, blood (Routine X 2) w Reflex to ID Panel     Status: None (Preliminary result)   Collection Time: 06/11/19 10:00 PM   Specimen: BLOOD  Result Value Ref Range Status   Specimen Description BLOOD RIGHT HAND  Final   Special Requests   Final    BOTTLES DRAWN AEROBIC ONLY Blood Culture results may not be optimal due to an inadequate volume of blood received in culture bottles   Culture   Final    NO GROWTH < 12 HOURS Performed at [facility], [place] [address] [place], [place] [address]    Report Status PENDING  Incomplete     Radiology Studies: CT ABDOMEN PELVIS WO CONTRAST  Result Date: 06/12/2019 CLINICAL DATA:  Worsening abdominal pain, elevated white blood cell count, history of acute sigmoid diverticulitis EXAM: CT ABDOMEN AND PELVIS WITHOUT CONTRAST TECHNIQUE: Multidetector CT imaging of the abdomen and pelvis was performed following the standard protocol  without IV contrast. COMPARISON:  06/11/2019 FINDINGS: Lower chest: No acute pleural or parenchymal lung disease. Hepatobiliary: No focal liver abnormality is seen. No gallstones, gallbladder wall thickening, or biliary dilatation. Respiratory motion limits evaluation. Pancreas: Unremarkable. No pancreatic ductal dilatation or surrounding inflammatory changes. Respiratory motion limits evaluation. Spleen: Normal in size without focal abnormality. Adrenals/Urinary Tract: Adrenal glands are unremarkable. Kidneys are normal, without renal calculi, focal lesion, or hydronephrosis. Bladder is unremarkable. Stomach/Bowel: There is continued wall thickening of  the mid sigmoid colon, with significant surrounding mesenteric inflammatory change. Punctate foci gas are seen adjacent to the inflamed sigmoid colon. There is increasing fluid in the right lower quadrant with superimposed gas fluid level compatible with perforation. No organized fluid collection, rim enhancement, or abscess. Vascular/Lymphatic: No significant vascular findings are present. No enlarged abdominal or pelvic lymph nodes. Reproductive: Uterus and bilateral adnexa are unremarkable. Other: Free fluid in the pelvis with gas fluid level consistent with perforation. No organized fluid collection or abscess. No abdominal wall hernia. Musculoskeletal: No acute or destructive bony lesions. Reconstructed images demonstrate no additional findings. IMPRESSION: 1. Progression of acute sigmoid diverticulitis, now with evidence of perforation. Increasing fluid in the right lower quadrant with gas fluid level. No rim enhancement to suggest organized fluid collection or abscess at this time. These results were called by telephone at the time of interpretation on 06/12/2019 at 3:31 pm to provider [facility] , who verbally acknowledged these results. Electronically Signed   By: Randa Ngo M.D.   On: 06/12/2019 15:41   DG Chest 1 View  Result Date:  06/11/2019 CLINICAL DATA:  59 year old female with history of shortness of breath. Abdominal cramping. EXAM: CHEST  1 VIEW COMPARISON:  Chest x-ray 05/23/2019. FINDINGS: Lung volumes are normal. No consolidative airspace disease. No pleural effusions. No pneumothorax. No pulmonary nodule or mass noted. Pulmonary vasculature and the cardiomediastinal silhouette are within normal limits. IMPRESSION: No radiographic evidence of acute cardiopulmonary disease. Electronically Signed   By: Vinnie Langton M.D.   On: 06/11/2019 17:25   CT ABDOMEN PELVIS W CONTRAST  Result Date: 06/11/2019 CLINICAL DATA:  Abdominal pain EXAM: CT ABDOMEN AND PELVIS WITH CONTRAST TECHNIQUE: Multidetector CT imaging of the abdomen and pelvis was performed using the standard protocol following bolus administration of intravenous contrast. CONTRAST:  123mL OMNIPAQUE IOHEXOL 300 MG/ML  SOLN COMPARISON:  2014 FINDINGS: Lower chest: No acute abnormality. Hepatobiliary: Possible hepatic steatosis. Gallbladder is unremarkable Pancreas: Unremarkable. Spleen: Unremarkable. Adrenals/Urinary Tract: Adrenals, kidneys, and bladder are unremarkable. Stomach/Bowel: Stomach is within normal limits. Bowel is normal in caliber. There are inflammatory changes about the sigmoid colon. Sigmoid diverticulosis is present. Appendix is not definitely visualized. Vascular/Lymphatic: No significant vascular findings are present. No enlarged abdominal or pelvic lymph nodes. Reproductive: Uterus and bilateral adnexa are unremarkable. Other: Small volume free fluid.  Abdominal wall is unremarkable. Musculoskeletal: Lumbar spine degenerative changes. There is no acute osseous abnormality. IMPRESSION: Acute sigmoid diverticulitis. Small volume free fluid no evidence of abscess. Electronically Signed   By: Macy Mis M.D.   On: 06/11/2019 17:05    Scheduled Meds: . buPROPion  450 mg Oral Daily  . carvedilol  3.125 mg Oral BID  . citalopram  40 mg Oral Daily  .  enoxaparin (LOVENOX) injection  80 mg Subcutaneous Q24H  . insulin aspart  0-9 Units Subcutaneous Q4H  . pantoprazole (PROTONIX) IV  40 mg Intravenous Q24H   Continuous Infusions: . lactated ringers 75 mL/hr at 06/11/19 2333  . piperacillin-tazobactam (ZOSYN)  IV 3.375 g (06/12/19 0949)  . sodium chloride Stopped (06/12/19 0949)     LOS: 1 day   Marylu Lund, MD [facility] Pager On Amion  If 7PM-7AM, please contact night-coverage 06/12/2019, 4:27 PM

## 2019-06-12 NOTE — Consult Note (Signed)
[facility]  Total days of antibiotics 1piptazo               Reason for Consult: diverticulitis   Referring Physician: chiu  Principal Problem:   Acute diverticulitis Active Problems:   Obesity (BMI 30-39.9)   ILD (interstitial lung disease) ([place])   Hypertension    HPI: [name] is a 59 y.o. female with anca-positive vasculitis, on remicade and cellcept who recently underwent D x C for post partum bleeding associated with endometrial polyp. She states that she had some discomfort initially post procedure on may 25th but it improved, then over started to have worsening abd pain and high fever which brought her to the ED. She has hx of diverticulitis. She had elevated WBC of 20K on admit, with CT that showed acute sigmoid diverticulitis with small amout of free fluid, no visible abscess. Over the course of her stay, she has become increasing distended ,tender in the lower quadrants, mild guarding. She is to have repeat CT scan looking for perforation or abscess. Her repeat scan now shows evidence of free air, with perforation, increasing fluid. Surgery evaluated patient who felt too high risk for surgery at this time, may make sense to have IR place drain and see how patient responds to medical management.  Past Medical History:  Diagnosis Date  . Acute blood loss anemia 06/23/2013  . 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 Anxiety associated with depression 09/23/2012  . At high risk for falls 09/23/2012   She has had a fractured ankle and tendon tear with falls over the last couple years.  . Cardiomyopathy ([place])   . Chronic fatigue 05/11/2014  . Chronic right-sided low back pain with right-sided sciatica 12/26/2016  . Colitis   . Constipation, chronic 09/27/2012  . Diabetes mellitus without complication ([place])   . 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 Diverticulitis of large intestine with  perforation 09/23/2012  . Dyspnea 05/11/2014  . GERD (gastroesophageal reflux disease)   . Hemoptysis 06/23/2013  . History of endometriosis 1997  . Hx of tobacco use, presenting hazards to health 09/23/2012  . Hyperglycemia 07/24/2013  . Hypertension   . Hypokalemia 07/20/2013  . ILD (interstitial lung disease) ([place]) 01/22/2014  . Leukocytosis 05/11/2014  . MPA (microscopic polyangiitis) ([place]) 2015  . Numbness   . Obesity (BMI 30-39.9) 09/23/2012  . Paresthesia 12/26/2016  . Perforation of sigmoid colon - stercoral 09/27/2012  . Physical deconditioning 04/30/2015  . 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  . Respiratory failure with hypoxia ([place]) 06/23/2013  . Right leg swelling 12/26/2016    Allergies:  Allergies  Allergen Reactions  . Guaifenesin Nausea And Vomiting and Other (See Comments)    un  . Mucinex [Guaifenesin Er] Nausea And Vomiting  . Sulfa Antibiotics Nausea And Vomiting    MEDICATIONS: . buPROPion  450 mg Oral Daily  . citalopram  40 mg Oral Daily  . enoxaparin (LOVENOX) injection  80 mg Subcutaneous Q24H  . insulin aspart  0-9 Units Subcutaneous Q4H  . metoprolol tartrate  2.5 mg Intravenous Q6H  . pantoprazole (PROTONIX) IV  40 mg Intravenous Q24H    Social History   Tobacco Use  . Smoking status: Former Smoker    Packs/day: 1.00    Years: 27.00    Pack years: 27.00    Quit date: 01/09/2001    Years since quitting: 18.4  . Smokeless tobacco: Never Used  Substance Use  Topics  . Alcohol use: No  . Drug use: No    Family History  Problem Relation Age of Onset  . Diabetes Mother        AODM  . Diabetes Father        AODM  . Hypertension Father   . Obesity Father   . Breast cancer Paternal Grandmother   . Birth defects Cousin   . CAD Other        [facility]     Review of Systems  Constitutional: Negative for fever, chills, diaphoresis, activity change, appetite change, fatigue and unexpected weight change.  HENT: Negative for congestion, sore throat,  rhinorrhea, sneezing, trouble swallowing and sinus pressure.  Eyes: Negative for photophobia and visual disturbance.  Respiratory: Negative for cough, chest tightness, shortness of breath, wheezing and stridor.  Cardiovascular: Negative for chest pain, palpitations and leg swelling.  Gastrointestinal: Negative for nausea, vomiting, abdominal pain, diarrhea, constipation, blood in stool, abdominal distention and anal bleeding.  Genitourinary: Negative for dysuria, hematuria, flank pain and difficulty urinating.  Musculoskeletal: Negative for myalgias, back pain, joint swelling, arthralgias and gait problem.  Skin: Negative for color change, pallor, rash and wound.  Neurological: Negative for dizziness, tremors, weakness and light-headedness.  Hematological: Negative for adenopathy. Does not bruise/bleed easily.  Psychiatric/Behavioral: Negative for behavioral problems, confusion, sleep disturbance, dysphoric mood, decreased concentration and agitation.     OBJECTIVE: Temp:  [97.9 F (36.6 C)-101.2 F (38.4 C)] 98.2 F (36.8 C) (06/03 1627) Pulse Rate:  [76-115] 82 (06/03 1627) Resp:  [17-28] 20 (06/03 1627) BP: (79-147)/(48-116) 147/84 (06/03 1627) SpO2:  [92 %-98 %] 93 % (06/03 1627) Physical Exam  Constitutional:  oriented to person, place, and time. appears well-developed and well-nourished. No distress.  HENT: [place]/AT, PERRLA, no scleral icterus Mouth/Throat: Oropharynx is clear and moist. No oropharyngeal exudate.  Cardiovascular: Normal rate, regular rhythm and normal heart sounds. Exam reveals no gallop and no friction rub.  No murmur heard.  Pulmonary/Chest: Effort normal and breath sounds normal. No respiratory distress.  has no wheezes.  Neck = supple, no nuchal rigidity Abdominal: Soft. Bowel sounds are decreased. Exhibits distension. Lower quadrant tenderness. No induration or erythema Lymphadenopathy: no cervical adenopathy. No axillary adenopathy Neurological: alert and  oriented to person, place, and time.  Skin: Skin is warm and dry. No rash noted. No erythema.  Psychiatric: a normal mood and affect.  behavior is normal.    LABS: Results for orders placed or performed during the hospital encounter of 06/11/19 (from the past 48 hour(s))  CBC with Differential     Status: Abnormal   Collection Time: 06/11/19 12:43 PM  Result Value Ref Range   WBC 15.3 (H) 4.0 - 10.5 K/uL   RBC 4.93 3.87 - 5.11 MIL/uL   Hemoglobin 13.1 12.0 - 15.0 g/dL   HCT 43.0 36.0 - 46.0 %   MCV 87.2 80.0 - 100.0 fL   MCH 26.6 26.0 - 34.0 pg   MCHC 30.5 30.0 - 36.0 g/dL   RDW 14.2 11.5 - 15.5 %   Platelets 333 150 - 400 K/uL   nRBC 0.0 0.0 - 0.2 %   Neutrophils Relative % 87 %   Neutro Abs 13.4 (H) 1.7 - 7.7 K/uL   Lymphocytes Relative 6 %   Lymphs Abs 0.9 0.7 - 4.0 K/uL   Monocytes Relative 5 %   Monocytes Absolute 0.7 0.1 - 1.0 K/uL   Eosinophils Relative 1 %   Eosinophils Absolute 0.1 0.0 - 0.5 K/uL  Basophils Relative 0 %   Basophils Absolute 0.0 0.0 - 0.1 K/uL   Immature Granulocytes 1 %   Abs Immature Granulocytes 0.07 0.00 - 0.07 K/uL    Comment: Performed at [facility], [place] [address] [place], [place] [address]  Comprehensive metabolic panel     Status: Abnormal   Collection Time: 06/11/19 12:43 PM  Result Value Ref Range   Sodium 139 135 - 145 mmol/L   Potassium 4.4 3.5 - 5.1 mmol/L   Chloride 102 98 - 111 mmol/L   CO2 25 22 - 32 mmol/L   Glucose, Bld 159 (H) 70 - 99 mg/dL    Comment: Glucose reference range applies only to samples taken after fasting for at least 8 hours.   BUN 19 6 - 20 mg/dL   Creatinine, Ser 1.03 (H) 0.44 - 1.00 mg/dL   Calcium 8.9 8.9 - 10.3 mg/dL   Total Protein 6.9 6.5 - 8.1 g/dL   Albumin 3.5 3.5 - 5.0 g/dL   AST 14 (L) 15 - 41 U/L   ALT 15 0 - 44 U/L   Alkaline Phosphatase 107 38 - 126 U/L   Total Bilirubin 0.8 0.3 - 1.2 mg/dL   GFR calc non Af Amer 60 (L) >60 mL/min   GFR calc Af Amer >60 >60 mL/min   Anion gap 12 5  - 15    Comment: Performed at [facility] [address] [place], [place] [address]  Lipase, blood     Status: None   Collection Time: 06/11/19 12:43 PM  Result Value Ref Range   Lipase 15 11 - 51 U/L    Comment: Performed at [facility] [address], [place], [place] [address]  Urinalysis, Routine w reflex microscopic     Status: Abnormal   Collection Time: 06/11/19  6:15 PM  Result Value Ref Range   Color, Urine YELLOW YELLOW   APPearance CLEAR CLEAR   Specific Gravity, Urine >1.046 (H) 1.005 - 1.030   pH 5.0 5.0 - 8.0   Glucose, UA NEGATIVE NEGATIVE mg/dL   Hgb urine dipstick SMALL (A) NEGATIVE   Bilirubin Urine NEGATIVE NEGATIVE   Ketones, ur NEGATIVE NEGATIVE mg/dL   Protein, ur NEGATIVE NEGATIVE mg/dL   Nitrite NEGATIVE NEGATIVE   Leukocytes,Ua NEGATIVE NEGATIVE   RBC / HPF 0-5 0 - 5 RBC/hpf   WBC, UA 0-5 0 - 5 WBC/hpf   Bacteria, UA NONE SEEN NONE SEEN   Squamous Epithelial / LPF 0-5 0 - 5   Mucus PRESENT     Comment: Performed at [facility], [place] [address], [place], [place] [address]  SARS Coronavirus 2 by RT PCR (hospital order, performed in [facility] hospital lab) Nasopharyngeal Nasopharyngeal Swab     Status: None   Collection Time: 06/11/19  6:16 PM   Specimen: Nasopharyngeal Swab  Result Value Ref Range   SARS Coronavirus 2 NEGATIVE NEGATIVE    Comment: (NOTE) SARS-CoV-2 target nucleic acids are NOT DETECTED. The SARS-CoV-2 RNA is generally detectable in upper and lower respiratory specimens during the acute phase of infection. The lowest concentration of SARS-CoV-2 viral copies this assay can detect is 250 copies / mL. A negative result does not preclude SARS-CoV-2 infection and should not be used as the sole basis for treatment or other patient management decisions.  A negative result may occur with improper specimen collection / handling, submission of specimen other than nasopharyngeal swab, presence of viral mutation(s) within  the areas targeted by this assay, and inadequate number of viral copies (<250 copies / mL). A negative result must be combined with clinical observations, patient history, and epidemiological information. Fact Sheet for Patients:   StrictlyIdeas.no Fact Sheet for Healthcare Providers: BankingDealers.co.za This test is not yet approved or cleared  by the [place] FDA and has been authorized for detection and/or diagnosis of SARS-CoV-2 by FDA under an Emergency Use Authorization (EUA).  This EUA will remain in effect (meaning this test can be used) for the duration of the COVID-19 declaration under Section 564(b)(1) of the Act, 21 U.S.C. section 360bbb-3(b)(1), unless the authorization is terminated or revoked sooner. Performed at [facility], [place] [address], [place], [place] [address]   Culture, blood (Routine X 2) w Reflex to ID Panel     Status: None (Preliminary result)   Collection Time: 06/11/19  8:20 PM   Specimen: BLOOD  Result Value Ref Range   Specimen Description BLOOD BLOOD LEFT FOREARM    Special Requests      BOTTLES DRAWN AEROBIC AND ANAEROBIC Blood Culture results may not be optimal due to an inadequate volume of blood received in culture bottles   Culture      NO GROWTH < 12 HOURS Performed at [facility] [address], [place], [place] [address]    Report Status PENDING   HIV Antibody (routine testing w rflx)     Status: None   Collection Time: 06/11/19  8:20 PM  Result Value Ref Range   HIV Screen 4th Generation wRfx Non Reactive Non Reactive    Comment: Performed at [facility], [place] [address] [place], [place] [address]  CBC     Status: Abnormal   Collection Time: 06/11/19  8:20 PM  Result Value Ref Range   WBC 21.5 (H) 4.0 - 10.5 K/uL   RBC 4.80 3.87 - 5.11 MIL/uL   Hemoglobin 12.7 12.0 - 15.0 g/dL   HCT 41.9 36.0 - 46.0 %   MCV 87.3 80.0 - 100.0 fL   MCH 26.5 26.0 - 34.0 pg    MCHC 30.3 30.0 - 36.0 g/dL   RDW 14.2 11.5 - 15.5 %   Platelets 308 150 - 400 K/uL   nRBC 0.0 0.0 - 0.2 %    Comment: Performed at [facility], [place] [address] [place], [place] [address]  Creatinine, serum     Status: None   Collection Time: 06/11/19  8:20 PM  Result Value Ref Range   Creatinine, Ser 0.98 0.44 - 1.00 mg/dL   GFR calc non Af Amer >60 >60 mL/min   GFR calc Af Amer >60 >60 mL/min    Comment: Performed at [facility] [address], [place], [place] [address]  CBG monitoring, ED     Status: Abnormal   Collection Time: 06/11/19  8:20 PM  Result Value Ref Range   Glucose-Capillary 165 (H) 70 - 99 mg/dL    Comment: Glucose reference range applies only to samples taken after fasting for at least 8 hours.  Glucose, capillary     Status: Abnormal   Collection Time: 06/11/19  9:03 PM  Result Value Ref Range   Glucose-Capillary 181 (H) 70 - 99 mg/dL    Comment: Glucose reference range applies only to samples taken after fasting for at least 8 hours.  Culture, blood (Routine X 2) w Reflex to ID Panel     Status: None (Preliminary result)  Collection Time: 06/11/19 10:00 PM   Specimen: BLOOD  Result Value Ref Range   Specimen Description BLOOD RIGHT HAND    Special Requests      BOTTLES DRAWN AEROBIC ONLY Blood Culture results may not be optimal due to an inadequate volume of blood received in culture bottles   Culture      NO GROWTH < 12 HOURS Performed at [facility] [address] [place], [place] [address]    Report Status PENDING   Glucose, capillary     Status: Abnormal   Collection Time: 06/12/19 12:11 AM  Result Value Ref Range   Glucose-Capillary 153 (H) 70 - 99 mg/dL    Comment: Glucose reference range applies only to samples taken after fasting for at least 8 hours.  Comprehensive metabolic panel     Status: Abnormal   Collection Time: 06/12/19  2:09 AM  Result Value Ref Range   Sodium 140 135 - 145 mmol/L   Potassium 4.2 3.5 - 5.1  mmol/L   Chloride 107 98 - 111 mmol/L   CO2 23 22 - 32 mmol/L   Glucose, Bld 145 (H) 70 - 99 mg/dL    Comment: Glucose reference range applies only to samples taken after fasting for at least 8 hours.   BUN 19 6 - 20 mg/dL   Creatinine, Ser 1.66 (H) 0.44 - 1.00 mg/dL   Calcium 8.2 (L) 8.9 - 10.3 mg/dL   Total Protein 5.9 (L) 6.5 - 8.1 g/dL   Albumin 2.9 (L) 3.5 - 5.0 g/dL   AST 14 (L) 15 - 41 U/L   ALT 12 0 - 44 U/L   Alkaline Phosphatase 89 38 - 126 U/L   Total Bilirubin 1.4 (H) 0.3 - 1.2 mg/dL   GFR calc non Af Amer 34 (L) >60 mL/min   GFR calc Af Amer 39 (L) >60 mL/min   Anion gap 10 5 - 15    Comment: Performed at [facility] [address], [place], [place] [address]  CBC     Status: Abnormal   Collection Time: 06/12/19  2:09 AM  Result Value Ref Range   WBC 24.4 (H) 4.0 - 10.5 K/uL   RBC 4.39 3.87 - 5.11 MIL/uL   Hemoglobin 11.6 (L) 12.0 - 15.0 g/dL   HCT 38.6 36.0 - 46.0 %   MCV 87.9 80.0 - 100.0 fL   MCH 26.4 26.0 - 34.0 pg   MCHC 30.1 30.0 - 36.0 g/dL   RDW 14.4 11.5 - 15.5 %   Platelets 333 150 - 400 K/uL   nRBC 0.0 0.0 - 0.2 %    Comment: Performed at [facility], [place] [address] [place], [place] [address]  Glucose, capillary     Status: Abnormal   Collection Time: 06/12/19  4:46 AM  Result Value Ref Range   Glucose-Capillary 147 (H) 70 - 99 mg/dL    Comment: Glucose reference range applies only to samples taken after fasting for at least 8 hours.  Glucose, capillary     Status: Abnormal   Collection Time: 06/12/19  8:22 AM  Result Value Ref Range   Glucose-Capillary 135 (H) 70 - 99 mg/dL    Comment: Glucose reference range applies only to samples taken after fasting for at least 8 hours.  Sedimentation rate     Status: Abnormal   Collection Time: 06/12/19 10:57 AM  Result Value Ref Range   Sed Rate 55 (H) 0 - 22 mm/hr  Comment: Performed at [facility], [place] [address], [place], [place] [address]  Glucose, capillary     Status:  Abnormal   Collection Time: 06/12/19 12:41 PM  Result Value Ref Range   Glucose-Capillary 132 (H) 70 - 99 mg/dL    Comment: Glucose reference range applies only to samples taken after fasting for at least 8 hours.    MICRO: 6/2 blood cx ngtd IMAGING: CT ABDOMEN PELVIS WO CONTRAST  Result Date: 06/12/2019 CLINICAL DATA:  Worsening abdominal pain, elevated white blood cell count, history of acute sigmoid diverticulitis EXAM: CT ABDOMEN AND PELVIS WITHOUT CONTRAST TECHNIQUE: Multidetector CT imaging of the abdomen and pelvis was performed following the standard protocol without IV contrast. COMPARISON:  06/11/2019 FINDINGS: Lower chest: No acute pleural or parenchymal lung disease. Hepatobiliary: No focal liver abnormality is seen. No gallstones, gallbladder wall thickening, or biliary dilatation. Respiratory motion limits evaluation. Pancreas: Unremarkable. No pancreatic ductal dilatation or surrounding inflammatory changes. Respiratory motion limits evaluation. Spleen: Normal in size without focal abnormality. Adrenals/Urinary Tract: Adrenal glands are unremarkable. Kidneys are normal, without renal calculi, focal lesion, or hydronephrosis. Bladder is unremarkable. Stomach/Bowel: There is continued wall thickening of the mid sigmoid colon, with significant surrounding mesenteric inflammatory change. Punctate foci gas are seen adjacent to the inflamed sigmoid colon. There is increasing fluid in the right lower quadrant with superimposed gas fluid level compatible with perforation. No organized fluid collection, rim enhancement, or abscess. Vascular/Lymphatic: No significant vascular findings are present. No enlarged abdominal or pelvic lymph nodes. Reproductive: Uterus and bilateral adnexa are unremarkable. Other: Free fluid in the pelvis with gas fluid level consistent with perforation. No organized fluid collection or abscess. No abdominal wall hernia. Musculoskeletal: No acute or destructive bony  lesions. Reconstructed images demonstrate no additional findings. IMPRESSION: 1. Progression of acute sigmoid diverticulitis, now with evidence of perforation. Increasing fluid in the right lower quadrant with gas fluid level. No rim enhancement to suggest organized fluid collection or abscess at this time. These results were called by telephone at the time of interpretation on 06/12/2019 at 3:31 pm to provider [facility] , who verbally acknowledged these results. Electronically Signed   By: Randa Ngo M.D.   On: 06/12/2019 15:41   DG Chest 1 View  Result Date: 06/11/2019 CLINICAL DATA:  59 year old female with history of shortness of breath. Abdominal cramping. EXAM: CHEST  1 VIEW COMPARISON:  Chest x-ray 05/23/2019. FINDINGS: Lung volumes are normal. No consolidative airspace disease. No pleural effusions. No pneumothorax. No pulmonary nodule or mass noted. Pulmonary vasculature and the cardiomediastinal silhouette are within normal limits. IMPRESSION: No radiographic evidence of acute cardiopulmonary disease. Electronically Signed   By: Vinnie Langton M.D.   On: 06/11/2019 17:25   CT ABDOMEN PELVIS W CONTRAST  Result Date: 06/11/2019 CLINICAL DATA:  Abdominal pain EXAM: CT ABDOMEN AND PELVIS WITH CONTRAST TECHNIQUE: Multidetector CT imaging of the abdomen and pelvis was performed using the standard protocol following bolus administration of intravenous contrast. CONTRAST:  177mL OMNIPAQUE IOHEXOL 300 MG/ML  SOLN COMPARISON:  2014 FINDINGS: Lower chest: No acute abnormality. Hepatobiliary: Possible hepatic steatosis. Gallbladder is unremarkable Pancreas: Unremarkable. Spleen: Unremarkable. Adrenals/Urinary Tract: Adrenals, kidneys, and bladder are unremarkable. Stomach/Bowel: Stomach is within normal limits. Bowel is normal in caliber. There are inflammatory changes about the sigmoid colon. Sigmoid diverticulosis is present. Appendix is not definitely visualized. Vascular/Lymphatic: No significant  vascular findings are present. No enlarged abdominal or pelvic lymph nodes. Reproductive: Uterus and bilateral adnexa are unremarkable. Other: Small  volume free fluid.  Abdominal wall is unremarkable. Musculoskeletal: Lumbar spine degenerative changes. There is no acute osseous abnormality. IMPRESSION: Acute sigmoid diverticulitis. Small volume free fluid no evidence of abscess. Electronically Signed   By: Macy Mis M.D.   On: 06/11/2019 17:05    HISTORICAL MICRO/IMAGING  Assessment/Plan:  Perforated diverticulitis  - continue with piptazo - agree with surgery patient is high risk of complications from colon resection given her immune suppression, recommend IR to place drain - please send out fluid for aerobic/anaerobic/fungal culture then can adjust cultures based on findings  Leukocytosis = suspect it will continue to rise until drain is placed. Continue to monitor   Yosiel Thieme B. [facility] [phone]

## 2019-06-12 NOTE — Plan of Care (Signed)
  Problem: Health Behavior/Discharge Planning: Goal: Ability to manage health-related needs will improve Outcome: Progressing   Problem: Education: Goal: Knowledge of General Education information will improve Description: Including pain rating scale, medication(s)/side effects and non-pharmacologic comfort measures Outcome: Progressing   

## 2019-06-12 NOTE — Plan of Care (Signed)
  Problem: Education: Goal: Knowledge of General Education information will improve Description Including pain rating scale, medication(s)/side effects and non-pharmacologic comfort measures Outcome: Progressing   

## 2019-06-12 NOTE — Consult Note (Addendum)
[name] [birth year]  [id number].    Requesting MD: Dr. 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  She was treated nonoperatively at that time and improved without surgical intervention.  Last week she underwent a D&C by GYN for an endometrial polyp causing bleeding.  Shortly after this procedure the patient developed some abdominal pain.  She thought she was constipated and took some Miralax.  She had several BMs but this did not seem to resolve her symptoms or pain so she came to the ED. ED work up included CT scan which showed acute sigmoid diverticulitis with small volume free fluid no evidence of abscess. WBC 15.3. Patient was admitted to the hospitalist service for IV zosyn and bowel rest. Today she had a worsening leukocytosis WBC 24.4 and worsening abdominal pain so a CT scan was repeated. This showed progression of acute sigmoid diverticulitis now with evidence of perforation, increasing fluid in the right lower quadrant with gas fluid level, no rim enhancement to suggest organized fluid collection or abscess at this time. General surgery asked to see.  Lives at home with her husband Not currently employed Nonsmoker Last colonoscopy she thinks was >10 years ago Prior abdominal surgeries: appendectomy, diagnostic laparoscopy by OB/GYN 1997, laparoscopy with LOA by OB/GYN 2002  ROS: Review of Systems  Constitutional: Negative.   Respiratory: Negative.   Cardiovascular: Negative.   Gastrointestinal: Positive for abdominal pain and nausea.  Genitourinary: Negative.   Musculoskeletal: Positive for back pain and joint pain.    Family History  Problem Relation Age of Onset  . Diabetes Mother        AODM  . Diabetes Father        AODM  . Hypertension Father   . Obesity Father   . Breast cancer Paternal Grandmother   . Birth defects Cousin   . CAD Other        [facility]    Past Medical History:  Diagnosis Date  . Acute blood loss anemia 06/23/2013  . 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[place])   . 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[place]) 06/24/2013  . Anxiety associated with depression 09/23/2012  . At high risk for falls 09/23/2012   She has had a fractured ankle and tendon tear with falls over the last couple years.  . Cardiomyopathy ([place])   . Chronic fatigue 05/11/2014  . Chronic right-sided low back pain with right-sided sciatica 12/26/2016  . Colitis   . Constipation, chronic 09/27/2012  . Diabetes mellitus without complication ([place])   . 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 Diverticulitis of large intestine with perforation 09/23/2012  . Dyspnea 05/11/2014  . GERD (gastroesophageal reflux disease)   . Hemoptysis 06/23/2013  . History of endometriosis 1997  . Hx of tobacco use, presenting hazards to health 09/23/2012  . Hyperglycemia 07/24/2013  . Hypertension   . Hypokalemia 07/20/2013  . ILD (interstitial lung disease) ([place]) 01/22/2014  . Leukocytosis 05/11/2014  . MPA (microscopic polyangiitis) ([place]) 2015  . Numbness   . Obesity (BMI 30-39.9) 09/23/2012  . Paresthesia 12/26/2016  . Perforation of sigmoid colon - stercoral 09/27/2012  . Physical deconditioning 04/30/2015  . 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  . Respiratory failure with hypoxia ([place]) 06/23/2013  . Right leg swelling 12/26/2016    Past Surgical History:  Procedure Laterality Date  . APPENDECTOMY    . BLADDER REPAIR    . DILATATION & CURETTAGE/HYSTEROSCOPY WITH MYOSURE N/A 06/03/2019   Procedure: DILATATION & CURETTAGE/HYSTEROSCOPY WITH MYOSURE;  Surgeon: Everlene Farrier, MD;  Location: [facility];  Service: Gynecology;  Laterality: N/A;  . LAPAROSCOPY  1997   dx of endometriosis  . LAPAROSCOPY  04/28/2000     Laparoscopy with lysis of adhesions, hysteroscopy, D&C.  Marland Kitchen TONSILLECTOMY      Social History:  reports that she quit smoking about 18 years ago. She has a 27.00 pack-year smoking history. She has never used smokeless tobacco. She reports that she does not drink alcohol or use drugs.  Allergies:  Allergies  Allergen Reactions  . Guaifenesin Nausea And Vomiting and Other (See Comments)    un  . Mucinex [Guaifenesin Er] Nausea And Vomiting  . Sulfa Antibiotics Nausea And Vomiting    Medications Prior to Admission  Medication Sig Dispense Refill  . ALPRAZolam (XANAX) 1 MG tablet Take 1 tablet (1 mg total) by mouth 3 (three) times daily as needed for anxiety. 90 tablet 0  . buPROPion (WELLBUTRIN XL) 150 MG 24 hr tablet Take 450 mg by mouth daily.    . carvedilol (COREG) 12.5 MG tablet TAKE 1 TABLET BY MOUTH TWICE A DAY (Patient taking differently: Take 12.5 mg by mouth 2 (two) times daily with a meal. ) 180 tablet 1  . citalopram (CELEXA) 40 MG tablet Take 40 mg by mouth daily.    . clobetasol cream (TEMOVATE) 4.31 % Apply 1 application topically 2 (two) times daily as needed (paronychia).     Marland Kitchen ENTRESTO 49-51 MG TAKE 1 TABLET BY MOUTH TWICE A DAY (Patient taking differently: Take 1 tablet by mouth 2 (two) times daily. ) 180 tablet 1  . esomeprazole (NEXIUM) 20 MG capsule Take 40 mg by mouth daily.     . folic acid (FOLVITE) 1 MG tablet Take 1 mg by mouth daily.     Marland Kitchen gabapentin (NEURONTIN) 400 MG capsule Take 400 mg by mouth at bedtime as needed (pain).     . meloxicam (MOBIC) 15 MG tablet Take 15 mg by mouth daily.     . metFORMIN (GLUCOPHAGE-XR) 500 MG 24 hr tablet Take 500 mg by mouth daily with breakfast.     . methotrexate (50 MG/ML) 1 g injection Inject into the vein once a week.     . Multiple Vitamin (MULTIVITAMIN) tablet Take 2 tablets by mouth daily.     . mycophenolate (CELLCEPT) 500 MG tablet Take 1,500 mg by mouth 2 (two) times daily.     . polyethylene glycol (MIRALAX /  GLYCOLAX) packet Take 17 g by mouth daily as needed for moderate constipation (constipation).     Marland Kitchen dextromethorphan (DELSYM) 30 MG/5ML liquid Take 15 mg by mouth 2 (two) times daily as needed for cough.     . Fluticasone-Salmeterol (ADVAIR) 100-50 MCG/DOSE AEPB Inhale 1 puff into the lungs 2 (two) times daily as needed (asthma).     . oxyCODONE (OXY IR/ROXICODONE) 5 MG immediate release tablet Take 5 mg by mouth every 6 (six) hours as needed for pain.    . phentermine (ADIPEX-P) 37.5 MG tablet Take 37.5 mg by mouth daily.    . progesterone (PROMETRIUM) 200 MG capsule Take 200 mg by mouth at bedtime.       Physical Exam: Blood pressure 94/84, pulse 78, temperature 97.9 F (36.6 C), temperature source Oral, resp. rate 20, height 5\' 8"  (1.727  m), weight (!) 174.2 kg, SpO2 97 %.  General: white female who is laying in bed in NAD HEENT: head is normocephalic, atraumatic.  Sclera are noninjected.  PERRL.  Ears and nose without any masses or lesions.  Mouth is pink and moist. Dentition fair Heart: regular, rate, and rhythm.  Normal s1,s2. No obvious murmurs, gallops, or rubs noted.  Palpable pedal pulses bilaterally  Lungs: CTAB, no wheezes, rhonchi, or rales noted.  Respiratory effort nonlabored Abd: obese, soft/ not rigid, diffuse tenderness most significant in the lower right abdomen, no rebound or guarding, +BS, no masses, hernias, or organomegaly MS: no BUE/BLE edema, calves soft and nontender Skin: warm and dry with no masses, lesions, or rashes Psych: A&Ox4 with an appropriate affect Neuro: cranial nerves grossly intact, equal strength in BUE/BLE bilaterally, normal speech, thought process intact  Results for orders placed or performed during the hospital encounter of 06/11/19 (from the past 48 hour(s))  CBC with Differential     Status: Abnormal   Collection Time: 06/11/19 12:43 PM  Result Value Ref Range   WBC 15.3 (H) 4.0 - 10.5 K/uL   RBC 4.93 3.87 - 5.11 MIL/uL   Hemoglobin 13.1  12.0 - 15.0 g/dL   HCT 43.0 36.0 - 46.0 %   MCV 87.2 80.0 - 100.0 fL   MCH 26.6 26.0 - 34.0 pg   MCHC 30.5 30.0 - 36.0 g/dL   RDW 14.2 11.5 - 15.5 %   Platelets 333 150 - 400 K/uL   nRBC 0.0 0.0 - 0.2 %   Neutrophils Relative % 87 %   Neutro Abs 13.4 (H) 1.7 - 7.7 K/uL   Lymphocytes Relative 6 %   Lymphs Abs 0.9 0.7 - 4.0 K/uL   Monocytes Relative 5 %   Monocytes Absolute 0.7 0.1 - 1.0 K/uL   Eosinophils Relative 1 %   Eosinophils Absolute 0.1 0.0 - 0.5 K/uL   Basophils Relative 0 %   Basophils Absolute 0.0 0.0 - 0.1 K/uL   Immature Granulocytes 1 %   Abs Immature Granulocytes 0.07 0.00 - 0.07 K/uL    Comment: Performed at [facility], [address] [place], [place] [address]  Comprehensive metabolic panel     Status: Abnormal   Collection Time: 06/11/19 12:43 PM  Result Value Ref Range   Sodium 139 135 - 145 mmol/L   Potassium 4.4 3.5 - 5.1 mmol/L   Chloride 102 98 - 111 mmol/L   CO2 25 22 - 32 mmol/L   Glucose, Bld 159 (H) 70 - 99 mg/dL    Comment: Glucose reference range applies only to samples taken after fasting for at least 8 hours.   BUN 19 6 - 20 mg/dL   Creatinine, Ser 1.03 (H) 0.44 - 1.00 mg/dL   Calcium 8.9 8.9 - 10.3 mg/dL   Total Protein 6.9 6.5 - 8.1 g/dL   Albumin 3.5 3.5 - 5.0 g/dL   AST 14 (L) 15 - 41 U/L   ALT 15 0 - 44 U/L   Alkaline Phosphatase 107 38 - 126 U/L   Total Bilirubin 0.8 0.3 - 1.2 mg/dL   GFR calc non Af Amer 60 (L) >60 mL/min   GFR calc Af Amer >60 >60 mL/min   Anion gap 12 5 - 15    Comment: Performed at [facility] [address], [place], [place] [address]  Lipase, blood     Status: None   Collection Time: 06/11/19 12:43 PM  Result Value  Ref Range   Lipase 15 11 - 51 U/L    Comment: Performed at [facility] [address] [place], [place] [address]  Urinalysis, Routine w reflex microscopic     Status: Abnormal   Collection Time: 06/11/19  6:15 PM  Result Value Ref Range   Color, Urine YELLOW YELLOW    APPearance CLEAR CLEAR   Specific Gravity, Urine >1.046 (H) 1.005 - 1.030   pH 5.0 5.0 - 8.0   Glucose, UA NEGATIVE NEGATIVE mg/dL   Hgb urine dipstick SMALL (A) NEGATIVE   Bilirubin Urine NEGATIVE NEGATIVE   Ketones, ur NEGATIVE NEGATIVE mg/dL   Protein, ur NEGATIVE NEGATIVE mg/dL   Nitrite NEGATIVE NEGATIVE   Leukocytes,Ua NEGATIVE NEGATIVE   RBC / HPF 0-5 0 - 5 RBC/hpf   WBC, UA 0-5 0 - 5 WBC/hpf   Bacteria, UA NONE SEEN NONE SEEN   Squamous Epithelial / LPF 0-5 0 - 5   Mucus PRESENT     Comment: Performed at [facility], [place] [address] [place], [place] [address]  SARS Coronavirus 2 by RT PCR (hospital order, performed in [facility] hospital lab) Nasopharyngeal Nasopharyngeal Swab     Status: None   Collection Time: 06/11/19  6:16 PM   Specimen: Nasopharyngeal Swab  Result Value Ref Range   SARS Coronavirus 2 NEGATIVE NEGATIVE    Comment: (NOTE) SARS-CoV-2 target nucleic acids are NOT DETECTED. The SARS-CoV-2 RNA is generally detectable in upper and lower respiratory specimens during the acute phase of infection. The lowest concentration of SARS-CoV-2 viral copies this assay can detect is 250 copies / mL. A negative result does not preclude SARS-CoV-2 infection and should not be used as the sole basis for treatment or other patient management decisions.  A negative result may occur with improper specimen collection / handling, submission of specimen other than nasopharyngeal swab, presence of viral mutation(s) within the areas targeted by this assay, and inadequate number of viral copies (<250 copies / mL). A negative result must be combined with clinical observations, patient history, and epidemiological information. Fact Sheet for Patients:   StrictlyIdeas.no Fact Sheet for Healthcare Providers: BankingDealers.co.za This test is not yet approved or cleared  by the [place] FDA and has been authorized for detection  and/or diagnosis of SARS-CoV-2 by FDA under an Emergency Use Authorization (EUA).  This EUA will remain in effect (meaning this test can be used) for the duration of the COVID-19 declaration under Section 564(b)(1) of the Act, 21 U.S.C. section 360bbb-3(b)(1), unless the authorization is terminated or revoked sooner. Performed at [facility], [place] [address], [place], [place] [address]   Culture, blood (Routine X 2) w Reflex to ID Panel     Status: None (Preliminary result)   Collection Time: 06/11/19  8:20 PM   Specimen: BLOOD  Result Value Ref Range   Specimen Description BLOOD BLOOD LEFT FOREARM    Special Requests      BOTTLES DRAWN AEROBIC AND ANAEROBIC Blood Culture results may not be optimal due to an inadequate volume of blood received in culture bottles   Culture      NO GROWTH < 12 HOURS Performed at [facility] [address], [place], [place] [address]    Report Status PENDING   HIV Antibody (routine testing w rflx)     Status: None   Collection Time: 06/11/19  8:20 PM  Result Value Ref Range   HIV Screen 4th Generation wRfx Non  Reactive Non Reactive    Comment: Performed at [facility], [place] [address] [place], [place] [address]  CBC     Status: Abnormal   Collection Time: 06/11/19  8:20 PM  Result Value Ref Range   WBC 21.5 (H) 4.0 - 10.5 K/uL   RBC 4.80 3.87 - 5.11 MIL/uL   Hemoglobin 12.7 12.0 - 15.0 g/dL   HCT 41.9 36.0 - 46.0 %   MCV 87.3 80.0 - 100.0 fL   MCH 26.5 26.0 - 34.0 pg   MCHC 30.3 30.0 - 36.0 g/dL   RDW 14.2 11.5 - 15.5 %   Platelets 308 150 - 400 K/uL   nRBC 0.0 0.0 - 0.2 %    Comment: Performed at [facility], [place] [address], [place], [place] [address]  Creatinine, serum     Status: None   Collection Time: 06/11/19  8:20 PM  Result Value Ref Range   Creatinine, Ser 0.98 0.44 - 1.00 mg/dL   GFR calc non Af Amer >60 >60 mL/min   GFR calc Af Amer >60 >60 mL/min    Comment: Performed at [facility] [address] [place], [place] [address]  CBG monitoring, ED     Status: Abnormal   Collection Time: 06/11/19  8:20 PM  Result Value Ref Range   Glucose-Capillary 165 (H) 70 - 99 mg/dL    Comment: Glucose reference range applies only to samples taken after fasting for at least 8 hours.  Glucose, capillary     Status: Abnormal   Collection Time: 06/11/19  9:03 PM  Result Value Ref Range   Glucose-Capillary 181 (H) 70 - 99 mg/dL    Comment: Glucose reference range applies only to samples taken after fasting for at least 8 hours.  Culture, blood (Routine X 2) w Reflex to ID Panel     Status: None (Preliminary result)   Collection Time: 06/11/19 10:00 PM   Specimen: BLOOD  Result Value Ref Range   Specimen Description BLOOD RIGHT HAND    Special Requests      BOTTLES DRAWN AEROBIC ONLY Blood Culture results may not be optimal due to an inadequate volume of blood received in culture bottles   Culture      NO GROWTH < 12 HOURS Performed at [facility] [address], [place], [place] [address]    Report Status PENDING   Glucose, capillary     Status: Abnormal   Collection Time: 06/12/19 12:11 AM  Result Value Ref Range   Glucose-Capillary 153 (H) 70 - 99 mg/dL    Comment: Glucose reference range applies only to samples taken after fasting for at least 8 hours.  Comprehensive metabolic panel     Status: Abnormal   Collection Time: 06/12/19  2:09 AM  Result Value Ref Range   Sodium 140 135 - 145 mmol/L   Potassium 4.2 3.5 - 5.1 mmol/L   Chloride 107 98 - 111 mmol/L   CO2 23 22 - 32 mmol/L   Glucose, Bld 145 (H) 70 - 99 mg/dL    Comment: Glucose reference range applies only to samples taken after fasting for at least 8 hours.   BUN 19 6 - 20 mg/dL   Creatinine, Ser 1.66 (H) 0.44 - 1.00 mg/dL   Calcium 8.2 (L) 8.9 - 10.3 mg/dL   Total Protein 5.9 (L) 6.5 - 8.1 g/dL   Albumin 2.9 (L) 3.5 - 5.0 g/dL   AST 14 (L) 15 -  41 U/L   ALT 12 0 - 44 U/L   Alkaline Phosphatase 89 38 - 126 U/L    Total Bilirubin 1.4 (H) 0.3 - 1.2 mg/dL   GFR calc non Af Amer 34 (L) >60 mL/min   GFR calc Af Amer 39 (L) >60 mL/min   Anion gap 10 5 - 15    Comment: Performed at [facility] [address], [place], [place] [address]  CBC     Status: Abnormal   Collection Time: 06/12/19  2:09 AM  Result Value Ref Range   WBC 24.4 (H) 4.0 - 10.5 K/uL   RBC 4.39 3.87 - 5.11 MIL/uL   Hemoglobin 11.6 (L) 12.0 - 15.0 g/dL   HCT 38.6 36.0 - 46.0 %   MCV 87.9 80.0 - 100.0 fL   MCH 26.4 26.0 - 34.0 pg   MCHC 30.1 30.0 - 36.0 g/dL   RDW 14.4 11.5 - 15.5 %   Platelets 333 150 - 400 K/uL   nRBC 0.0 0.0 - 0.2 %    Comment: Performed at [facility], [place] [address], [place], [place] [address]  Glucose, capillary     Status: Abnormal   Collection Time: 06/12/19  4:46 AM  Result Value Ref Range   Glucose-Capillary 147 (H) 70 - 99 mg/dL    Comment: Glucose reference range applies only to samples taken after fasting for at least 8 hours.  Glucose, capillary     Status: Abnormal   Collection Time: 06/12/19  8:22 AM  Result Value Ref Range   Glucose-Capillary 135 (H) 70 - 99 mg/dL    Comment: Glucose reference range applies only to samples taken after fasting for at least 8 hours.  Sedimentation rate     Status: Abnormal   Collection Time: 06/12/19 10:57 AM  Result Value Ref Range   Sed Rate 55 (H) 0 - 22 mm/hr    Comment: Performed at [facility] [address] [place],  [address]  Glucose, capillary     Status: Abnormal   Collection Time: 06/12/19 12:41 PM  Result Value Ref Range   Glucose-Capillary 132 (H) 70 - 99 mg/dL    Comment: Glucose reference range applies only to samples taken after fasting for at least 8 hours.   CT ABDOMEN PELVIS WO CONTRAST  Result Date: 06/12/2019 CLINICAL DATA:  Worsening abdominal pain, elevated white blood cell count, history of acute sigmoid diverticulitis EXAM: CT ABDOMEN AND PELVIS WITHOUT CONTRAST TECHNIQUE: Multidetector CT imaging of the  abdomen and pelvis was performed following the standard protocol without IV contrast. COMPARISON:  06/11/2019 FINDINGS: Lower chest: No acute pleural or parenchymal lung disease. Hepatobiliary: No focal liver abnormality is seen. No gallstones, gallbladder wall thickening, or biliary dilatation. Respiratory motion limits evaluation. Pancreas: Unremarkable. No pancreatic ductal dilatation or surrounding inflammatory changes. Respiratory motion limits evaluation. Spleen: Normal in size without focal abnormality. Adrenals/Urinary Tract: Adrenal glands are unremarkable. Kidneys are normal, without renal calculi, focal lesion, or hydronephrosis. Bladder is unremarkable. Stomach/Bowel: There is continued wall thickening of the mid sigmoid colon, with significant surrounding mesenteric inflammatory change. Punctate foci gas are seen adjacent to the inflamed sigmoid colon. There is increasing fluid in the right lower quadrant with superimposed gas fluid level compatible with perforation. No organized fluid collection, rim enhancement, or abscess. Vascular/Lymphatic: No significant vascular findings are present. No enlarged abdominal or pelvic lymph nodes. Reproductive: Uterus and bilateral adnexa are unremarkable. Other: Free fluid in the pelvis with gas  fluid level consistent with perforation. No organized fluid collection or abscess. No abdominal wall hernia. Musculoskeletal: No acute or destructive bony lesions. Reconstructed images demonstrate no additional findings. IMPRESSION: 1. Progression of acute sigmoid diverticulitis, now with evidence of perforation. Increasing fluid in the right lower quadrant with gas fluid level. No rim enhancement to suggest organized fluid collection or abscess at this time. These results were called by telephone at the time of interpretation on 06/12/2019 at 3:31 pm to provider [facility] , who verbally acknowledged these results. Electronically Signed   By: Randa Ngo M.D.   On:  06/12/2019 15:41   DG Chest 1 View  Result Date: 06/11/2019 CLINICAL DATA:  59 year old female with history of shortness of breath. Abdominal cramping. EXAM: CHEST  1 VIEW COMPARISON:  Chest x-ray 05/23/2019. FINDINGS: Lung volumes are normal. No consolidative airspace disease. No pleural effusions. No pneumothorax. No pulmonary nodule or mass noted. Pulmonary vasculature and the cardiomediastinal silhouette are within normal limits. IMPRESSION: No radiographic evidence of acute cardiopulmonary disease. Electronically Signed   By: Vinnie Langton M.D.   On: 06/11/2019 17:25   CT ABDOMEN PELVIS W CONTRAST  Result Date: 06/11/2019 CLINICAL DATA:  Abdominal pain EXAM: CT ABDOMEN AND PELVIS WITH CONTRAST TECHNIQUE: Multidetector CT imaging of the abdomen and pelvis was performed using the standard protocol following bolus administration of intravenous contrast. CONTRAST:  190mL OMNIPAQUE IOHEXOL 300 MG/ML  SOLN COMPARISON:  2014 FINDINGS: Lower chest: No acute abnormality. Hepatobiliary: Possible hepatic steatosis. Gallbladder is unremarkable Pancreas: Unremarkable. Spleen: Unremarkable. Adrenals/Urinary Tract: Adrenals, kidneys, and bladder are unremarkable. Stomach/Bowel: Stomach is within normal limits. Bowel is normal in caliber. There are inflammatory changes about the sigmoid colon. Sigmoid diverticulosis is present. Appendix is not definitely visualized. Vascular/Lymphatic: No significant vascular findings are present. No enlarged abdominal or pelvic lymph nodes. Reproductive: Uterus and bilateral adnexa are unremarkable. Other: Small volume free fluid.  Abdominal wall is unremarkable. Musculoskeletal: Lumbar spine degenerative changes. There is no acute osseous abnormality. IMPRESSION: Acute sigmoid diverticulitis. Small volume free fluid no evidence of abscess. Electronically Signed   By: Macy Mis M.D.   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 Will ask IR to evaluate and see if there is a drainable fluid collection. Agree with bowel rest and IV zosyn.  If she is able to get over this bout without surgery we would recommend colonoscopy in 6-8 weeks, and she could follow up  with one of our colorectal surgeons to discuss elective colectomy.  Wellington Hampshire, [facility] 06/12/2019, 3:50 PM Please see Am[facility] for pager number during day hours 7:00am-4:30pm or 7:00am -11:30am on weekends

## 2019-06-13 ENCOUNTER — Inpatient Hospital Stay (HOSPITAL_COMMUNITY): Payer: Commercial Managed Care - PPO

## 2019-06-13 LAB — COMPREHENSIVE METABOLIC PANEL
ALT: 15 U/L (ref 0–44)
AST: 21 U/L (ref 15–41)
Albumin: 2.9 g/dL — ABNORMAL LOW (ref 3.5–5.0)
Alkaline Phosphatase: 94 U/L (ref 38–126)
Anion gap: 14 (ref 5–15)
BUN: 36 mg/dL — ABNORMAL HIGH (ref 6–20)
CO2: 20 mmol/L — ABNORMAL LOW (ref 22–32)
Calcium: 7.9 mg/dL — ABNORMAL LOW (ref 8.9–10.3)
Chloride: 104 mmol/L (ref 98–111)
Creatinine, Ser: 4.37 mg/dL — ABNORMAL HIGH (ref 0.44–1.00)
GFR calc Af Amer: 12 mL/min — ABNORMAL LOW (ref >60)
GFR calc non Af Amer: 10 mL/min — ABNORMAL LOW (ref >60)
Glucose, Bld: 138 mg/dL — ABNORMAL HIGH (ref 70–99)
Potassium: 4.3 mmol/L (ref 3.5–5.1)
Sodium: 138 mmol/L (ref 135–145)
Total Bilirubin: 1.1 mg/dL (ref 0.3–1.2)
Total Protein: 6.4 g/dL — ABNORMAL LOW (ref 6.5–8.1)

## 2019-06-13 LAB — GLUCOSE, CAPILLARY
Glucose-Capillary: 126 mg/dL — ABNORMAL HIGH (ref 70–99)
Glucose-Capillary: 131 mg/dL — ABNORMAL HIGH (ref 70–99)
Glucose-Capillary: 110 mg/dL — ABNORMAL HIGH (ref 70–99)
Glucose-Capillary: 104 mg/dL — ABNORMAL HIGH (ref 70–99)
Glucose-Capillary: 111 mg/dL — ABNORMAL HIGH (ref 70–99)

## 2019-06-13 LAB — CBC
HCT: 34.8 % — ABNORMAL LOW (ref 36.0–46.0)
Hemoglobin: 10.3 g/dL — ABNORMAL LOW (ref 12.0–15.0)
MCH: 26.4 pg (ref 26.0–34.0)
MCHC: 29.6 g/dL — ABNORMAL LOW (ref 30.0–36.0)
MCV: 89.2 fL (ref 80.0–100.0)
Platelets: 287 10*3/uL (ref 150–400)
RBC: 3.9 MIL/uL (ref 3.87–5.11)
RDW: 14.9 % (ref 11.5–15.5)
WBC: 26.1 10*3/uL — ABNORMAL HIGH (ref 4.0–10.5)
nRBC: 0 % (ref 0.0–0.2)

## 2019-06-13 LAB — PROTIME-INR
INR: 1.3 — ABNORMAL HIGH (ref 0.8–1.2)
Prothrombin Time: 15.8 seconds — ABNORMAL HIGH (ref 11.4–15.2)

## 2019-06-13 MED ORDER — SODIUM CHLORIDE 0.9 % IV SOLN: INTRAVENOUS | Status: DC

## 2019-06-13 MED ORDER — MIDAZOLAM HCL 2 MG/2ML IJ SOLN
INTRAMUSCULAR | Status: AC | PRN
  Administered 2019-06-13: 1 mg via INTRAVENOUS

## 2019-06-13 MED ORDER — CHLORHEXIDINE GLUCONATE CLOTH 2 % EX PADS
6.0000 | MEDICATED_PAD | Freq: Every day | CUTANEOUS | Status: DC
  Administered 2019-06-13 – 2019-06-28 (×15): 6 via TOPICAL

## 2019-06-13 MED ORDER — PIPERACILLIN-TAZOBACTAM 3.375 G IVPB
3.3750 g | Freq: Two times a day (BID) | INTRAVENOUS | Status: DC
  Administered 2019-06-13 – 2019-06-14 (×2): 3.375 g via INTRAVENOUS
  Filled 2019-06-13 (×2): qty 50

## 2019-06-13 MED ORDER — SODIUM CHLORIDE 0.9% FLUSH
5.0000 mL | Freq: Three times a day (TID) | INTRAVENOUS | Status: DC
  Administered 2019-06-13 – 2019-06-30 (×43): 5 mL

## 2019-06-13 MED ORDER — FENTANYL CITRATE (PF) 100 MCG/2ML IJ SOLN
INTRAMUSCULAR | Status: AC | PRN
  Administered 2019-06-13: 50 ug via INTRAVENOUS

## 2019-06-13 MED ORDER — ENOXAPARIN SODIUM 40 MG/0.4ML ~~LOC~~ SOLN
40.0000 mg | SUBCUTANEOUS | Status: DC
  Administered 2019-06-13: 40 mg via SUBCUTANEOUS
  Filled 2019-06-13: qty 0.4

## 2019-06-13 MED ORDER — MIDAZOLAM HCL 2 MG/2ML IJ SOLN
INTRAMUSCULAR | Status: AC
  Filled 2019-06-13: qty 2

## 2019-06-13 MED ORDER — FENTANYL CITRATE (PF) 100 MCG/2ML IJ SOLN
INTRAMUSCULAR | Status: AC
  Filled 2019-06-13: qty 2

## 2019-06-13 NOTE — Procedures (Signed)
  Procedure: CT RLQ abscess drain placement 56ml purulent out EBL:   minimal Complications:  none immediate  See full dictation in [facility].  Dillard Cannon MD Main # [phone] Pager  [phone]

## 2019-06-13 NOTE — Progress Notes (Signed)
Pt left the unit for IR drain placement.

## 2019-06-13 NOTE — Consult Note (Addendum)
[facility]rology Consultation Note  Requesting MD: Dr Marylu Lund Reason for consult: AKI  HPI:  [name] is a 59 y.o. 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  She had D&C procedure done on 5/25.  After the procedures she started having swelling of her abdomen and constipation.  In ED she had WBC count of 15, tachycardic, tachypneic.  CT abdomen pelvis with contrast showed sigmoid diverticulitis.  She was a started on Zosyn and admitted for further evaluation.  The serum creatinine level was 0.98 on admission.  She started having worsening leukocytosis to WBC 26.1 associated with worsening abdominal pain.  The repeat CT scan was done which showed acute sigmoid diverticulitis with evidence of perforation.  General surgery was consulted thought to be extremely high risk for surgical intervention therefore IR was consulted.  IR placed right lower quadrant drain on 6/4. She was hypotensive to systolic BP 16X, febrile to 101.  Today the blood pressure dropped to systolic 09U. The repeat labs showed elevated to 4.37, potassium level 4.3.  The urinalysis was bland.  It is only 50 cc.  She received 500 cc of IV fluid.  Currently on LR 75 cc/hour. She received Entresto on admission which was discontinued when she become hypotensive.  She was also on meloxicam at home which is on hold. Kidneys looking normal without hydronephrosis in the CT scan. Patient denied headache, dizziness, nausea, vomiting, chest pain, shortness of breath.  She was talking but really not able to focus on question.  Creatinine, Ser  Date/Time Value Ref Range Status   06/13/2019 03:21 AM 4.37 (H) 0.44 - 1.00 mg/dL Final    Comment:    DELTA CHECK NOTED  06/12/2019 02:09 AM 1.66 (H) 0.44 - 1.00 mg/dL Final  06/11/2019 08:20 PM 0.98 0.44 - 1.00 mg/dL Final  06/11/2019 12:43 PM 1.03 (H) 0.44 - 1.00 mg/dL Final    PMHx:   Past Medical History:  Diagnosis Date  . Acute blood loss anemia 06/23/2013  . 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[place])   . 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[place]) 06/24/2013  . Anxiety associated with depression 09/23/2012  . At high risk for falls 09/23/2012   She has had a fractured ankle and tendon tear with falls over the last couple years.  . Cardiomyopathy ([place])   . Chronic fatigue 05/11/2014  . Chronic right-sided low back pain with right-sided sciatica 12/26/2016  . Colitis   . Constipation, chronic 09/27/2012  . Diabetes mellitus without complication ([place])   . 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 Diverticulitis of large intestine with perforation 09/23/2012  . Dyspnea 05/11/2014  . GERD (gastroesophageal reflux disease)   . Hemoptysis 06/23/2013  . History of endometriosis 1997  . Hx of tobacco use, presenting hazards to health 09/23/2012  . Hyperglycemia 07/24/2013  . Hypertension   . Hypokalemia 07/20/2013  . ILD (interstitial lung disease) ([place]) 01/22/2014  . Leukocytosis 05/11/2014  . MPA (microscopic polyangiitis) ([place]) 2015  . Numbness   . Obesity (BMI 30-39.9) 09/23/2012  . Paresthesia 12/26/2016  . Perforation of sigmoid colon - stercoral 09/27/2012  . Physical deconditioning 04/30/2015  . 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  . Respiratory failure with hypoxia ([place]) 06/23/2013  . Right leg swelling 12/26/2016  Past Surgical History:  Procedure Laterality Date  . APPENDECTOMY    . BLADDER REPAIR    . DILATATION & CURETTAGE/HYSTEROSCOPY WITH MYOSURE N/A 06/03/2019   Procedure: DILATATION & CURETTAGE/HYSTEROSCOPY WITH MYOSURE;  Surgeon: Everlene Farrier, MD;  Location: [facility];  Service: Gynecology;  Laterality: N/A;  .  LAPAROSCOPY  1997   dx of endometriosis  . LAPAROSCOPY  04/28/2000    Laparoscopy with lysis of adhesions, hysteroscopy, D&C.  Marland Kitchen TONSILLECTOMY      Family Hx:  Family History  Problem Relation Age of Onset  . Diabetes Mother        AODM  . Diabetes Father        AODM  . Hypertension Father   . Obesity Father   . Breast cancer Paternal Grandmother   . Birth defects Cousin   . CAD Other        [facility]    Social History:  reports that she quit smoking about 18 years ago. She has a 27.00 pack-year smoking history. She has never used smokeless tobacco. She reports that she does not drink alcohol or use drugs.  Allergies:  Allergies  Allergen Reactions  . Guaifenesin Nausea And Vomiting and Other (See Comments)    un  . Mucinex [Guaifenesin Er] Nausea And Vomiting  . Sulfa Antibiotics Nausea And Vomiting    Medications: Prior to Admission medications   Medication Sig Start Date End Date Taking? Authorizing Provider  ALPRAZolam Duanne Moron) 1 MG tablet Take 1 tablet (1 mg total) by mouth 3 (three) times daily as needed for anxiety. 08/22/13  Yes Blanchie Serve, MD  buPROPion (WELLBUTRIN XL) 150 MG 24 hr tablet Take 450 mg by mouth daily.   Yes [provider]  carvedilol (COREG) 12.5 MG tablet TAKE 1 TABLET BY MOUTH TWICE A DAY Patient taking differently: Take 12.5 mg by mouth 2 (two) times daily with a meal.  02/26/19  Yes Josue Hector, MD  citalopram (CELEXA) 40 MG tablet Take 40 mg by mouth daily.   Yes [provider]  clobetasol cream (TEMOVATE) 6.43 % Apply 1 application topically 2 (two) times daily as needed (paronychia).  04/08/19  Yes [provider]  ENTRESTO 49-51 MG TAKE 1 TABLET BY MOUTH TWICE A DAY Patient taking differently: Take 1 tablet by mouth 2 (two) times daily.  02/26/19  Yes Josue Hector, MD  esomeprazole (NEXIUM) 20 MG capsule Take 40 mg by mouth daily.    Yes [provider]  folic acid (FOLVITE) 1 MG tablet Take 1 mg by  mouth daily.    Yes [provider]  gabapentin (NEURONTIN) 400 MG capsule Take 400 mg by mouth at bedtime as needed (pain).  04/08/19  Yes [provider]  meloxicam (MOBIC) 15 MG tablet Take 15 mg by mouth daily.  04/08/19  Yes [provider]  metFORMIN (GLUCOPHAGE-XR) 500 MG 24 hr tablet Take 500 mg by mouth daily with breakfast.  10/03/17  Yes [provider]  methotrexate (50 MG/ML) 1 g injection Inject into the vein once a week.    Yes [provider]  Multiple Vitamin (MULTIVITAMIN) tablet Take 2 tablets by mouth daily.    Yes [provider]  mycophenolate (CELLCEPT) 500 MG tablet Take 1,500 mg by mouth 2 (two) times daily.  06/05/17  Yes [provider]  polyethylene glycol (MIRALAX / GLYCOLAX) packet Take 17 g by mouth daily as needed for moderate constipation (constipation).    Yes [provider]  dextromethorphan (DELSYM) 30 MG/5ML liquid Take 15 mg by mouth 2 (two) times daily as needed for cough.     [provider]  Fluticasone-Salmeterol (ADVAIR) 100-50 MCG/DOSE AEPB Inhale 1 puff into the lungs 2 (two) times daily as needed (asthma).     [provider]  oxyCODONE (OXY IR/ROXICODONE) 5 MG immediate release tablet Take 5 mg by mouth every 6 (six) hours as needed for pain. 05/28/19   [provider]  phentermine (ADIPEX-P) 37.5 MG tablet Take 37.5 mg by mouth daily. 04/06/18   [provider]  progesterone (PROMETRIUM) 200 MG capsule Take 200 mg by mouth at bedtime. 05/28/19   [provider]    I have reviewed the patient's current medications.  Labs:  Results for orders placed or performed during the hospital encounter of 06/11/19 (from the past 48 hour(s))  Urinalysis, Routine w reflex microscopic     Status: Abnormal   Collection Time: 06/11/19  6:15 PM  Result Value Ref Range   Color, Urine YELLOW YELLOW   APPearance CLEAR CLEAR   Specific Gravity, Urine >1.046  (H) 1.005 - 1.030   pH 5.0 5.0 - 8.0   Glucose, UA NEGATIVE NEGATIVE mg/dL   Hgb urine dipstick SMALL (A) NEGATIVE   Bilirubin Urine NEGATIVE NEGATIVE   Ketones, ur NEGATIVE NEGATIVE mg/dL   Protein, ur NEGATIVE NEGATIVE mg/dL   Nitrite NEGATIVE NEGATIVE   Leukocytes,Ua NEGATIVE NEGATIVE   RBC / HPF 0-5 0 - 5 RBC/hpf   WBC, UA 0-5 0 - 5 WBC/hpf   Bacteria, UA NONE SEEN NONE SEEN   Squamous Epithelial / LPF 0-5 0 - 5   Mucus PRESENT     Comment: Performed at [facility], [place] [address] [place], [place] [address]  SARS Coronavirus 2 by RT PCR (hospital order, performed in [facility] hospital lab) Nasopharyngeal Nasopharyngeal Swab     Status: None   Collection Time: 06/11/19  6:16 PM   Specimen: Nasopharyngeal Swab  Result Value Ref Range   SARS Coronavirus 2 NEGATIVE NEGATIVE    Comment: (NOTE) SARS-CoV-2 target nucleic acids are NOT DETECTED. The SARS-CoV-2 RNA is generally detectable in upper and lower respiratory specimens during the acute phase of infection. The lowest concentration of SARS-CoV-2 viral copies this assay can detect is 250 copies / mL. A negative result does not preclude SARS-CoV-2 infection and should not be used as the sole basis for treatment or other patient management decisions.  A negative result may occur with improper specimen collection / handling, submission of specimen other than nasopharyngeal swab, presence of viral mutation(s) within the areas targeted by this assay, and inadequate number of viral copies (<250 copies / mL). A negative result must be combined with clinical observations, patient history, and epidemiological information. Fact Sheet for Patients:   StrictlyIdeas.no Fact Sheet for Healthcare Providers: BankingDealers.co.za This test is not yet approved or cleared  by the [place] FDA and has been authorized for detection and/or diagnosis of SARS-CoV-2 by FDA under an  Emergency Use Authorization (EUA).  This EUA will remain in effect (meaning this test can be used) for the duration of the COVID-19 declaration under Section 564(b)(1) of the Act, 21 U.S.C. section 360bbb-3(b)(1), unless the authorization is terminated or revoked sooner. Performed at [facility], [place] [address], [place], [place] [address]   Culture, blood (Routine X 2) w Reflex to ID Panel     Status: None (Preliminary result)   Collection Time: 06/11/19  8:20 PM   Specimen: BLOOD  Result Value Ref Range   Specimen Description BLOOD BLOOD LEFT FOREARM    Special Requests      BOTTLES DRAWN AEROBIC AND ANAEROBIC Blood Culture results may not be optimal due to an inadequate volume of blood received in culture bottles   Culture      NO GROWTH 2 DAYS Performed at [facility] [address], [place], [place] [address]    Report Status PENDING   HIV Antibody (routine testing w rflx)     Status: None   Collection Time: 06/11/19  8:20 PM  Result Value Ref Range   HIV Screen 4th Generation wRfx Non Reactive Non Reactive    Comment: Performed at [facility], [place] [address] [place], [place] [address]  CBC     Status: Abnormal   Collection Time: 06/11/19  8:20 PM  Result Value Ref Range   WBC 21.5 (H) 4.0 - 10.5 K/uL   RBC 4.80 3.87 - 5.11 MIL/uL   Hemoglobin 12.7 12.0 - 15.0 g/dL   HCT 41.9 36.0 - 46.0 %   MCV 87.3 80.0 - 100.0 fL   MCH 26.5 26.0 - 34.0 pg   MCHC 30.3 30.0 - 36.0 g/dL   RDW 14.2 11.5 - 15.5 %   Platelets 308 150 - 400 K/uL   nRBC 0.0 0.0 - 0.2 %    Comment: Performed at [facility], [place] [address] [place], [place] [address]  Creatinine, serum     Status: None   Collection Time: 06/11/19  8:20 PM  Result Value Ref Range   Creatinine, Ser 0.98 0.44 - 1.00 mg/dL   GFR calc non Af Amer >60 >60 mL/min   GFR calc Af Amer >60 >60 mL/min    Comment: Performed at [facility] [address] [place],  [address]  CBG monitoring,  ED     Status: Abnormal   Collection Time: 06/11/19  8:20 PM  Result Value Ref Range   Glucose-Capillary 165 (H) 70 - 99 mg/dL    Comment: Glucose reference range applies only to samples taken after fasting for at least 8 hours.  Glucose, capillary     Status: Abnormal   Collection Time: 06/11/19  9:03 PM  Result Value Ref Range   Glucose-Capillary 181 (H) 70 - 99 mg/dL    Comment: Glucose reference range applies only to samples taken after fasting for at least 8 hours.  Culture, blood (Routine X 2) w Reflex to ID Panel     Status: None (Preliminary result)   Collection Time: 06/11/19 10:00 PM   Specimen: BLOOD  Result Value Ref Range   Specimen Description BLOOD RIGHT HAND    Special Requests      BOTTLES DRAWN AEROBIC ONLY Blood Culture results may not be optimal due to an inadequate volume of blood received in culture bottles   Culture      NO GROWTH 2 DAYS Performed at [facility] [address], [place],  [address]    Report Status PENDING   Glucose, capillary     Status: Abnormal   Collection Time: 06/12/19 12:11 AM  Result Value Ref Range   Glucose-Capillary 153 (H) 70 - 99 mg/dL    Comment: Glucose reference range applies only to samples taken after fasting for at least 8 hours.  Comprehensive metabolic panel     Status: Abnormal   Collection Time: 06/12/19  2:09 AM  Result Value Ref Range   Sodium 140 135 - 145 mmol/L   Potassium 4.2 3.5 - 5.1 mmol/L   Chloride 107 98 - 111 mmol/L   CO2 23 22 - 32 mmol/L   Glucose, Bld 145 (H) 70 - 99 mg/dL    Comment: Glucose reference range applies only to samples taken after fasting for at least 8 hours.   BUN 19 6 - 20 mg/dL   Creatinine, Ser 1.66 (H) 0.44 - 1.00 mg/dL   Calcium 8.2 (L) 8.9 - 10.3 mg/dL   Total Protein 5.9 (L) 6.5 - 8.1 g/dL   Albumin 2.9 (L) 3.5 - 5.0 g/dL   AST 14 (L) 15 - 41 U/L   ALT 12 0 - 44 U/L   Alkaline Phosphatase 89 38 - 126 U/L   Total Bilirubin 1.4 (H) 0.3 - 1.2 mg/dL   GFR calc  non Af Amer 34 (L) >60 mL/min   GFR calc Af Amer 39 (L) >60 mL/min   Anion gap 10 5 - 15    Comment: Performed at [facility] [address] [place], [place] [address]  CBC     Status: Abnormal   Collection Time: 06/12/19  2:09 AM  Result Value Ref Range   WBC 24.4 (H) 4.0 - 10.5 K/uL   RBC 4.39 3.87 - 5.11 MIL/uL   Hemoglobin 11.6 (L) 12.0 - 15.0 g/dL   HCT 38.6 36.0 - 46.0 %   MCV 87.9 80.0 - 100.0 fL   MCH 26.4 26.0 - 34.0 pg   MCHC 30.1 30.0 - 36.0 g/dL   RDW 14.4 11.5 - 15.5 %   Platelets 333 150 - 400 K/uL   nRBC 0.0 0.0 - 0.2 %    Comment: Performed at [facility], [place] [address], [place], [place] [address]  Glucose, capillary     Status: Abnormal   Collection Time: 06/12/19  4:46 AM  Result Value Ref Range   Glucose-Capillary 147 (H) 70 - 99 mg/dL    Comment: Glucose reference range applies only to samples taken after fasting for at least 8 hours.  Glucose, capillary     Status: Abnormal   Collection Time: 06/12/19  8:22 AM  Result Value Ref Range   Glucose-Capillary 135 (H) 70 - 99 mg/dL    Comment: Glucose reference range applies only to samples taken after fasting for at least 8 hours.  Sedimentation rate     Status: Abnormal   Collection Time: 06/12/19 10:57 AM  Result Value Ref Range   Sed Rate 55 (H) 0 - 22 mm/hr    Comment: Performed at [facility] [address], [place], [place] [address]  Glucose, capillary     Status: Abnormal   Collection Time: 06/12/19 12:41 PM  Result Value Ref Range   Glucose-Capillary 132 (H) 70 - 99 mg/dL    Comment: Glucose reference range applies only to samples taken after fasting for at least 8 hours.  Glucose, capillary     Status: Abnormal   Collection Time: 06/12/19  6:23 PM  Result Value Ref Range   Glucose-Capillary 111 (H) 70 - 99 mg/dL    Comment: Glucose reference range applies only to samples taken after fasting for at least 8 hours.  Glucose, capillary     Status: Abnormal   Collection Time:  06/12/19  8:05 PM  Result Value Ref Range   Glucose-Capillary 107 (H) 70 - 99 mg/dL    Comment: Glucose reference range  applies only to samples taken after fasting for at least 8 hours.  Glucose, capillary     Status: Abnormal   Collection Time: 06/12/19 11:58 PM  Result Value Ref Range   Glucose-Capillary 120 (H) 70 - 99 mg/dL    Comment: Glucose reference range applies only to samples taken after fasting for at least 8 hours.  Comprehensive metabolic panel     Status: Abnormal   Collection Time: 06/13/19  3:21 AM  Result Value Ref Range   Sodium 138 135 - 145 mmol/L   Potassium 4.3 3.5 - 5.1 mmol/L   Chloride 104 98 - 111 mmol/L   CO2 20 (L) 22 - 32 mmol/L   Glucose, Bld 138 (H) 70 - 99 mg/dL    Comment: Glucose reference range applies only to samples taken after fasting for at least 8 hours.   BUN 36 (H) 6 - 20 mg/dL   Creatinine, Ser 4.37 (H) 0.44 - 1.00 mg/dL    Comment: DELTA CHECK NOTED   Calcium 7.9 (L) 8.9 - 10.3 mg/dL   Total Protein 6.4 (L) 6.5 - 8.1 g/dL   Albumin 2.9 (L) 3.5 - 5.0 g/dL   AST 21 15 - 41 U/L   ALT 15 0 - 44 U/L   Alkaline Phosphatase 94 38 - 126 U/L   Total Bilirubin 1.1 0.3 - 1.2 mg/dL   GFR calc non Af Amer 10 (L) >60 mL/min   GFR calc Af Amer 12 (L) >60 mL/min   Anion gap 14 5 - 15    Comment: Performed at [facility] [address], [place], [place] [address]  CBC     Status: Abnormal   Collection Time: 06/13/19  3:21 AM  Result Value Ref Range   WBC 26.1 (H) 4.0 - 10.5 K/uL   RBC 3.90 3.87 - 5.11 MIL/uL   Hemoglobin 10.3 (L) 12.0 - 15.0 g/dL   HCT 34.8 (L) 36.0 - 46.0 %   MCV 89.2 80.0 - 100.0 fL   MCH 26.4 26.0 - 34.0 pg   MCHC 29.6 (L) 30.0 - 36.0 g/dL   RDW 14.9 11.5 - 15.5 %   Platelets 287 150 - 400 K/uL   nRBC 0.0 0.0 - 0.2 %    Comment: Performed at [facility], [place] [address], [place], [place] [address]  Glucose, capillary     Status: Abnormal   Collection Time: 06/13/19  4:17 AM  Result Value Ref Range    Glucose-Capillary 126 (H) 70 - 99 mg/dL    Comment: Glucose reference range applies only to samples taken after fasting for at least 8 hours.  Glucose, capillary     Status: Abnormal   Collection Time: 06/13/19  7:39 AM  Result Value Ref Range   Glucose-Capillary 131 (H) 70 - 99 mg/dL    Comment: Glucose reference range applies only to samples taken after fasting for at least 8 hours.  Protime-INR     Status: Abnormal   Collection Time: 06/13/19 10:15 AM  Result Value Ref Range   Prothrombin Time 15.8 (H) 11.4 - 15.2 seconds   INR 1.3 (H) 0.8 - 1.2    Comment: (NOTE) INR goal varies based on device and disease states. Performed at [facility], [place] [address] [place], [place] [address]   Glucose, capillary     Status: Abnormal   Collection Time: 06/13/19 11:51 AM  Result Value Ref Range   Glucose-Capillary 110 (H) 70 - 99 mg/dL  Comment: Glucose reference range applies only to samples taken after fasting for at least 8 hours.     ROS:  Pertinent items noted in HPI and remainder of comprehensive ROS otherwise negative.  Physical Exam: Vitals:   06/13/19 1350 06/13/19 1413  BP: 101/77 (!) 98/52  Pulse: 99 98  Resp: 18 19  Temp:  98.8 F (37.1 C)  SpO2: 97% 94%     General exam: Appears calm and comfortable  Respiratory system: Clear to auscultation. Respiratory effort normal. No wheezing or crackle Cardiovascular system: S1 & S2 heard, RRR.  No pedal edema. Gastrointestinal system: Abdomen is soft, right lower quadrant drain present.   Central nervous system: Alert and oriented. No focal neurological deficits. Extremities: Symmetric 5 x 5 power. Skin: No rashes, lesions or ulcers Psychiatry:  Mood & affect appropriate.  Unable to focus/concentrate on question.  Assessment/Plan:  #Acute kidney injury likely due to hemodynamically mediated due to hypotension, entresto and contrast nephropathy.  She was on meloxicam at home, currently on hold. The urinalysis and  serum creatinine level was normal on admission therefore no GN (h/o ANCA vasculitis).  CT scan ruled out obstruction. Order bladder scan.  Continue Foley catheter, strict ins and out.  Monitor BMP. Discontinue LR and change to NS at 125 cc/hour. Avoid hypotensive episode. Expect that the creatinine level will plateau in next couple of days.  #Sepsis due to perforated diverticulitis/intra-abdominal infection: Seen by general surgery thought to be very high risk for surgical intervention.  Status post right lower quadrant drain placed by IR on 6/4.  Currently on IV Zosyn.  #History of ANCA vasculitis/microscopic polyangiitis: Reportedly she was treated with steroids, plasmapheresis, rituximab in the past.  She is currently on CellCept and methotrexate which is on hold because of sepsis and AKI.  She follows with rheumatology.  #Hypertension: Became hypotensive and held [place].  Continue IV fluid.  Monitor blood pressure.  Discussed with the primary team. Thank you for the consult.  We will follow with you.  [name] Tanna Furry 06/13/2019, 4:02 PM  [facility].

## 2019-06-13 NOTE — Consult Note (Signed)
Chief Complaint: Patient was seen in consultation today for perforated diverticulitis  Referring Physician(s): Margie Billet, PA-C  Supervising Physician: Arne Cleveland  Patient Status: [facility] - In-pt  History of Present Illness: [name] is a 59 y.o. female with an extensive past medical history significant for anxiety, depression, chronic pain, ANCA vasculitis, anemia, cardiomyopathy, HTN, microscopic polyangiitis, GERD, ILD, endometriosis, DM and diverticulitis with previous perforation who presented to the ED on 6/2 with complaints of acute onset of right sided/pelvic abdominal pain. She underwent a D&C on 5/25 and noticed worsening cramping, constipation and abdominal pain following the procedure which she thought was related to constipation, however after several BMs her symptoms did not resolve. Initial workup in the ED notable for tachycardia, tachypnea, leukocytosis. CT A/P w/contrast showed acute sigmoid diverticulitis and small volume free fluid without evidence of abscess. She was admitted for further evaluation and management and was started on broad spectrum IV abx and bowel rest. Yesterday she was noted to have worsening leukocytosis and a CT A/P w/o contrast was performed which showed progression of acute sigmoid diverticulitis now with evidence of perforation and increasing RLQ fluid level. General surgery was consulted who deemed patient to be very high risk for surgical intervention at this time and IR has been asked to evaluate for possible percutaneous drain placement.   [name] reports severe abdominal pain to her lower abdomen, RLQ and RUQ - she is upset that her pain is now located in her RUQ and doesn't understand why that would happen. She is frustrated that she has not been able to eat anything and has only had ice chips since admission. She asks several times if the drain will ever be removed and when she will have surgery. We discussed indication for drain, why  surgery is on hold currently and that removal of drain will depend on output/imaging. She states understanding and wishes to proceed.  Past Medical History:  Diagnosis Date  . Acute blood loss anemia 06/23/2013  . 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 Anxiety associated with depression 09/23/2012  . At high risk for falls 09/23/2012   She has had a fractured ankle and tendon tear with falls over the last couple years.  . Cardiomyopathy ([place])   . Chronic fatigue 05/11/2014  . Chronic right-sided low back pain with right-sided sciatica 12/26/2016  . Colitis   . Constipation, chronic 09/27/2012  . Diabetes mellitus without complication ([place])   . 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 Diverticulitis of large intestine with perforation 09/23/2012  . Dyspnea 05/11/2014  . GERD (gastroesophageal reflux disease)   . Hemoptysis 06/23/2013  . History of endometriosis 1997  . Hx of tobacco use, presenting hazards to health 09/23/2012  . Hyperglycemia 07/24/2013  . Hypertension   . Hypokalemia 07/20/2013  . ILD (interstitial lung disease) ([place]) 01/22/2014  . Leukocytosis 05/11/2014  . MPA (microscopic polyangiitis) ([place]) 2015  . Numbness   . Obesity (BMI 30-39.9) 09/23/2012  . Paresthesia 12/26/2016  . Perforation of sigmoid colon - stercoral 09/27/2012  . Physical deconditioning 04/30/2015  . 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  . Respiratory failure with hypoxia ([place]) 06/23/2013  . Right leg swelling 12/26/2016    Past Surgical History:  Procedure Laterality Date  . APPENDECTOMY    . BLADDER REPAIR    . DILATATION & CURETTAGE/HYSTEROSCOPY WITH MYOSURE N/A 06/03/2019   Procedure: DILATATION & CURETTAGE/HYSTEROSCOPY WITH MYOSURE;  Surgeon: Everlene Farrier, MD;  Location: [facility];  Service: Gynecology;  Laterality: N/A;  . LAPAROSCOPY  1997   dx of endometriosis  . LAPAROSCOPY  04/28/2000    Laparoscopy with lysis of adhesions, hysteroscopy, D&C.    Marland Kitchen TONSILLECTOMY      Allergies: Guaifenesin, Mucinex [guaifenesin er], and Sulfa antibiotics  Medications: Prior to Admission medications   Medication Sig Start Date End Date Taking? Authorizing Provider  ALPRAZolam Duanne Moron) 1 MG tablet Take 1 tablet (1 mg total) by mouth 3 (three) times daily as needed for anxiety. 08/22/13  Yes Blanchie Serve, MD  buPROPion (WELLBUTRIN XL) 150 MG 24 hr tablet Take 450 mg by mouth daily.   Yes [provider]  carvedilol (COREG) 12.5 MG tablet TAKE 1 TABLET BY MOUTH TWICE A DAY Patient taking differently: Take 12.5 mg by mouth 2 (two) times daily with a meal.  02/26/19  Yes Josue Hector, MD  citalopram (CELEXA) 40 MG tablet Take 40 mg by mouth daily.   Yes [provider]  clobetasol cream (TEMOVATE) 6.76 % Apply 1 application topically 2 (two) times daily as needed (paronychia).  04/08/19  Yes [provider]  ENTRESTO 49-51 MG TAKE 1 TABLET BY MOUTH TWICE A DAY Patient taking differently: Take 1 tablet by mouth 2 (two) times daily.  02/26/19  Yes Josue Hector, MD  esomeprazole (NEXIUM) 20 MG capsule Take 40 mg by mouth daily.    Yes [provider]  folic acid (FOLVITE) 1 MG tablet Take 1 mg by mouth daily.    Yes [provider]  gabapentin (NEURONTIN) 400 MG capsule Take 400 mg by mouth at bedtime as needed (pain).  04/08/19  Yes [provider]  meloxicam (MOBIC) 15 MG tablet Take 15 mg by mouth daily.  04/08/19  Yes [provider]  metFORMIN (GLUCOPHAGE-XR) 500 MG 24 hr tablet Take 500 mg by mouth daily with breakfast.  10/03/17  Yes [provider]  methotrexate (50 MG/ML) 1 g injection Inject into the vein once a week.    Yes [provider]  Multiple Vitamin (MULTIVITAMIN) tablet Take 2 tablets by mouth daily.    Yes [provider]  mycophenolate (CELLCEPT) 500 MG tablet Take 1,500 mg by mouth 2 (two) times daily.  06/05/17  Yes [provider]   polyethylene glycol (MIRALAX / GLYCOLAX) packet Take 17 g by mouth daily as needed for moderate constipation (constipation).    Yes [provider]  dextromethorphan (DELSYM) 30 MG/5ML liquid Take 15 mg by mouth 2 (two) times daily as needed for cough.     [provider]  Fluticasone-Salmeterol (ADVAIR) 100-50 MCG/DOSE AEPB Inhale 1 puff into the lungs 2 (two) times daily as needed (asthma).     [provider]  oxyCODONE (OXY IR/ROXICODONE) 5 MG immediate release tablet Take 5 mg by mouth every 6 (six) hours as needed for pain. 05/28/19   [provider]  phentermine (ADIPEX-P) 37.5 MG tablet Take 37.5 mg by mouth daily. 04/06/18   [provider]  progesterone (PROMETRIUM) 200 MG capsule Take 200 mg by mouth at bedtime. 05/28/19   [provider]     Family History  Problem Relation Age of Onset  . Diabetes Mother        AODM  . Diabetes Father        AODM  . Hypertension Father   . Obesity Father   . Breast cancer Paternal Grandmother   . Birth defects Cousin   . CAD Other  [facility]    Social History   Socioeconomic History  . Marital status: Married    Spouse name: Not on file  . Number of children: 0  . Years of education: 93  . Highest education level: High school graduate  Occupational History  . Occupation: Disabled  Tobacco Use  . Smoking status: Former Smoker    Packs/day: 1.00    Years: 27.00    Pack years: 27.00    Quit date: 01/09/2001    Years since quitting: 18.4  . Smokeless tobacco: Never Used  Substance and Sexual Activity  . Alcohol use: No  . Drug use: No  . Sexual activity: Not on file  Other Topics Concern  . Not on file  Social History Narrative   Lives at home with her husband.   Right-handed.   1 cup caffeine per day, occasionally more.   Social Determinants of Health   Financial Resource Strain:   . Difficulty of Paying Living Expenses:   Food Insecurity:   . Worried About Paediatric nurse in the Last Year:   . Arboriculturist in the Last Year:   Transportation Needs:   . Film/video editor (Medical):   Marland Kitchen Lack of Transportation (Non-Medical):   Physical Activity:   . Days of Exercise per Week:   . Minutes of Exercise per Session:   Stress:   . Feeling of Stress :   Social Connections:   . Frequency of Communication with Friends and Family:   . Frequency of Social Gatherings with Friends and Family:   . Attends Religious Services:   . Active Member of Clubs or Organizations:   . Attends Archivist Meetings:   Marland Kitchen Marital Status:      Review of Systems: A 12 point ROS discussed and pertinent positives are indicated in the HPI above.  All other systems are negative.  Review of Systems  Constitutional: Positive for appetite change and fatigue. Negative for fever.  Respiratory: Positive for chest tightness and shortness of breath. Negative for cough.   Cardiovascular: Positive for chest pain and leg swelling.  Gastrointestinal: Positive for abdominal pain, constipation and nausea. Negative for blood in stool and vomiting.  Genitourinary: Negative for hematuria.  Musculoskeletal: Positive for back pain.  Neurological: Positive for headaches. Negative for dizziness.    Vital Signs: BP (!) 126/109 (BP Location: Right Arm)   Pulse 93   Temp 100.2 F (37.9 C) (Oral)   Resp 17   Ht 5\' 8"  (1.727 m)   Wt (!) 384 lb (174.2 kg)   SpO2 90%   BMI 58.39 kg/m   Physical Exam Vitals and nursing note reviewed.  Constitutional:      General: She is not in acute distress.    Appearance: She is obese. She is ill-appearing.  HENT:     Head: Normocephalic.     Mouth/Throat:     Mouth: Mucous membranes are moist.     Pharynx: Oropharynx is clear. No oropharyngeal exudate or posterior oropharyngeal erythema.  Cardiovascular:     Rate and Rhythm: Normal rate and regular rhythm.  Pulmonary:     Effort: Pulmonary effort is normal.     Breath sounds:  Normal breath sounds.  Abdominal:     General: There is no distension.     Palpations: Abdomen is soft.     Tenderness: There is abdominal tenderness (RLQ, RUQ, pelvis).  Skin:    General: Skin is warm and dry.  Neurological:  Mental Status: She is alert and oriented to person, place, and time.  Psychiatric:        Mood and Affect: Mood normal.        Behavior: Behavior normal.        Thought Content: Thought content normal.        Judgment: Judgment normal.      MD Evaluation Airway: WNL Heart: WNL Abdomen: WNL Chest/ Lungs: WNL ASA  Classification: 3 Mallampati/Airway Score: Two   Imaging: CT ABDOMEN PELVIS WO CONTRAST  Result Date: 06/12/2019 CLINICAL DATA:  Worsening abdominal pain, elevated white blood cell count, history of acute sigmoid diverticulitis EXAM: CT ABDOMEN AND PELVIS WITHOUT CONTRAST TECHNIQUE: Multidetector CT imaging of the abdomen and pelvis was performed following the standard protocol without IV contrast. COMPARISON:  06/11/2019 FINDINGS: Lower chest: No acute pleural or parenchymal lung disease. Hepatobiliary: No focal liver abnormality is seen. No gallstones, gallbladder wall thickening, or biliary dilatation. Respiratory motion limits evaluation. Pancreas: Unremarkable. No pancreatic ductal dilatation or surrounding inflammatory changes. Respiratory motion limits evaluation. Spleen: Normal in size without focal abnormality. Adrenals/Urinary Tract: Adrenal glands are unremarkable. Kidneys are normal, without renal calculi, focal lesion, or hydronephrosis. Bladder is unremarkable. Stomach/Bowel: There is continued wall thickening of the mid sigmoid colon, with significant surrounding mesenteric inflammatory change. Punctate foci gas are seen adjacent to the inflamed sigmoid colon. There is increasing fluid in the right lower quadrant with superimposed gas fluid level compatible with perforation. No organized fluid collection, rim enhancement, or abscess.  Vascular/Lymphatic: No significant vascular findings are present. No enlarged abdominal or pelvic lymph nodes. Reproductive: Uterus and bilateral adnexa are unremarkable. Other: Free fluid in the pelvis with gas fluid level consistent with perforation. No organized fluid collection or abscess. No abdominal wall hernia. Musculoskeletal: No acute or destructive bony lesions. Reconstructed images demonstrate no additional findings. IMPRESSION: 1. Progression of acute sigmoid diverticulitis, now with evidence of perforation. Increasing fluid in the right lower quadrant with gas fluid level. No rim enhancement to suggest organized fluid collection or abscess at this time. These results were called by telephone at the time of interpretation on 06/12/2019 at 3:31 pm to provider [facility] , who verbally acknowledged these results. Electronically Signed   By: Randa Ngo M.D.   On: 06/12/2019 15:41   DG Chest 1 View  Result Date: 06/11/2019 CLINICAL DATA:  59 year old female with history of shortness of breath. Abdominal cramping. EXAM: CHEST  1 VIEW COMPARISON:  Chest x-ray 05/23/2019. FINDINGS: Lung volumes are normal. No consolidative airspace disease. No pleural effusions. No pneumothorax. No pulmonary nodule or mass noted. Pulmonary vasculature and the cardiomediastinal silhouette are within normal limits. IMPRESSION: No radiographic evidence of acute cardiopulmonary disease. Electronically Signed   By: Vinnie Langton M.D.   On: 06/11/2019 17:25   DG Chest 2 View  Result Date: 05/23/2019 CLINICAL DATA:  Surgical clearance endometrial of lesion history of smoking EXAM: CHEST - 2 VIEW COMPARISON:  01/08/2014 FINDINGS: Mild chronic bronchitic changes. Normal cardiomediastinal silhouette. No focal opacity or pleural effusion. IMPRESSION: No active cardiopulmonary disease.  Mild chronic bronchitic changes Electronically Signed   By: Donavan Foil M.D.   On: 05/23/2019 23:23   CT ABDOMEN PELVIS W  CONTRAST  Result Date: 06/11/2019 CLINICAL DATA:  Abdominal pain EXAM: CT ABDOMEN AND PELVIS WITH CONTRAST TECHNIQUE: Multidetector CT imaging of the abdomen and pelvis was performed using the standard protocol following bolus administration of intravenous contrast. CONTRAST:  163mL OMNIPAQUE IOHEXOL 300 MG/ML  SOLN COMPARISON:  2014 FINDINGS: Lower chest: No acute abnormality. Hepatobiliary: Possible hepatic steatosis. Gallbladder is unremarkable Pancreas: Unremarkable. Spleen: Unremarkable. Adrenals/Urinary Tract: Adrenals, kidneys, and bladder are unremarkable. Stomach/Bowel: Stomach is within normal limits. Bowel is normal in caliber. There are inflammatory changes about the sigmoid colon. Sigmoid diverticulosis is present. Appendix is not definitely visualized. Vascular/Lymphatic: No significant vascular findings are present. No enlarged abdominal or pelvic lymph nodes. Reproductive: Uterus and bilateral adnexa are unremarkable. Other: Small volume free fluid.  Abdominal wall is unremarkable. Musculoskeletal: Lumbar spine degenerative changes. There is no acute osseous abnormality. IMPRESSION: Acute sigmoid diverticulitis. Small volume free fluid no evidence of abscess. Electronically Signed   By: Macy Mis M.D.   On: 06/11/2019 17:05    Labs:  CBC: Recent Labs    06/11/19 1243 06/11/19 2020 06/12/19 0209 06/13/19 0321  WBC 15.3* 21.5* 24.4* 26.1*  HGB 13.1 12.7 11.6* 10.3*  HCT 43.0 41.9 38.6 34.8*  PLT 333 308 333 287    COAGS: No results for input(s): INR, APTT in the last 8760 hours.  BMP: Recent Labs    05/30/19 0900 05/30/19 0900 06/11/19 1243 06/11/19 2020 06/12/19 0209 06/13/19 0321  NA 141  --  139  --  140 138  K 4.7  --  4.4  --  4.2 4.3  CL 107  --  102  --  107 104  CO2 23  --  25  --  23 20*  GLUCOSE 148*  --  159*  --  145* 138*  BUN 24*  --  19  --  19 36*  CALCIUM 9.0  --  8.9  --  8.2* 7.9*  CREATININE 0.89   < > 1.03* 0.98 1.66* 4.37*  GFRNONAA >60    < > 60* >60 34* 10*  GFRAA >60   < > >60 >60 39* 12*   < > = values in this interval not displayed.    LIVER FUNCTION TESTS: Recent Labs    05/30/19 0900 06/11/19 1243 06/12/19 0209 06/13/19 0321  BILITOT 0.7 0.8 1.4* 1.1  AST 14* 14* 14* 21  ALT 17 15 12 15   ALKPHOS 100 107 89 94  PROT 6.6 6.9 5.9* 6.4*  ALBUMIN 3.6 3.5 2.9* 2.9*    TUMOR MARKERS: No results for input(s): AFPTM, CEA, CA199, CHROMGRNA in the last 8760 hours.  Assessment and Plan:  59 y/o F with history as per HPI notable for previous diverticulitis with perforation who presented to the ED on 6/2 with acute abdominal pain following D&C on 5/25 - imaging initially showed sigmoid diverticulitis without perforation however repeat imaging yesterday showed progression of acute sigmoid diverticulitis with perforation and increasing RLQ fluid collection gas fluid level. General surgery has been following and has deemed patient to be a high surgical risk at this time and IR has been asked to evaluate for possible percutaneous drain placement. Patient imaging and history reviewed by Dr. Vernard Gambles who approves patient for drain placement tentatively planned for later today.  Patient has been NPO since arrival besides several ice chips this morning - she was instructed to hold further ice chips until post procedure. Last dose of Lovenox 6/3 @ 2024. Tmax 100.2, WBC 26.1, hgb 10.3, plt 287, INR pending. Currently receiving Zosyn per primary team.  Risks and benefits discussed with the patient including bleeding, infection, damage to adjacent structures, bowel perforation/fistula connection, and sepsis.  All of the patient's questions were answered, patient is agreeable to proceed.  Consent signed and in chart.  Thank you for this interesting consult.  I greatly enjoyed meeting [name] and look forward to participating in their care.  A copy of this report was sent to the requesting provider on this date.  Electronically  Signed: Joaquim Nam, PA-C 06/13/2019, 9:28 AM   I spent a total of 40 Minutes  in face to face in clinical consultation, greater than 50% of which was counseling/coordinating care for diverticular abscess drain placement.

## 2019-06-13 NOTE — Progress Notes (Signed)
ID PROGRESS NOTE   59yo F with acute perforated sigmoid diverticulitis s/p IR drain placement currently on piptazo.   24hr event: IR placed drain and labs show that she had AKI, possibly due to contrast  A/P: continue on pip/tazo, dose adjust based on her renal function. Follow up on cx from IR aspiration to see if can narrow abtx.  Dr Megan Salon to see over the weekend.  [facility] [phone]

## 2019-06-13 NOTE — Progress Notes (Signed)
Text paged on call NP for no urine output,waiting for reply.

## 2019-06-13 NOTE — Progress Notes (Signed)
Attempted bladder scan x4 but unable to find bladder due to anatomy. In and out cath done with only 10cc of clear amber urine obtained. Placed 16 french foley catheter with no urine output noted. Will leave foley in place and continue to monitor.

## 2019-06-13 NOTE — Progress Notes (Addendum)
Subjective: CC: Abdominal pain Patient upset this morning. Notes that her pain is in her lower abdomen, worse suprapubic and about the same in severity as yesterday. Some nausea overnight. No emesis. She is not passing flatus. No BM. Developed urinary retention overnight and had a foley placed this am. We had a long discussion, including the use of diagrams/pictures bout what diverticulitis is and the current plan for her hospital course.   Objective: Vital signs in last 24 hours: Temp:  [98.2 F (36.8 C)-100.2 F (37.9 C)] 100.2 F (37.9 C) (06/04 0421) Pulse Rate:  [82-97] 93 (06/04 0421) Resp:  [17-20] 17 (06/04 0421) BP: (104-155)/(41-137) 126/109 (06/04 0421) SpO2:  [90 %-95 %] 90 % (06/04 0421) Last BM Date: 06/08/19  Intake/Output from previous day: 06/03 0701 - 06/04 0700 In: 0  Out: 50 [Urine:50] Intake/Output this shift: No intake/output data recorded.  PE: Gen: Awake and alert, NAD Lungs: Normal rate and effort  Abd: obese, soft/ not rigid, diffuse tenderness most significant in the lower right abdomen and suprapubic abdomen, no rebound or guarding, +BS, no masses, hernias, or organomegaly Msk: No edema  Lab Results:  Recent Labs    06/12/19 0209 06/13/19 0321  WBC 24.4* 26.1*  HGB 11.6* 10.3*  HCT 38.6 34.8*  PLT 333 287   BMET Recent Labs    06/12/19 0209 06/13/19 0321  NA 140 138  K 4.2 4.3  CL 107 104  CO2 23 20*  GLUCOSE 145* 138*  BUN 19 36*  CREATININE 1.66* 4.37*  CALCIUM 8.2* 7.9*   PT/INR No results for input(s): LABPROT, INR in the last 72 hours. CMP     Component Value Date/Time   NA 138 06/13/2019 0321   NA 139 12/24/2017 1641   K 4.3 06/13/2019 0321   CL 104 06/13/2019 0321   CO2 20 (L) 06/13/2019 0321   GLUCOSE 138 (H) 06/13/2019 0321   BUN 36 (H) 06/13/2019 0321   BUN 15 12/24/2017 1641   CREATININE 4.37 (H) 06/13/2019 0321   CALCIUM 7.9 (L) 06/13/2019 0321   PROT 6.4 (L) 06/13/2019 0321   ALBUMIN 2.9 (L)  06/13/2019 0321   AST 21 06/13/2019 0321   ALT 15 06/13/2019 0321   ALKPHOS 94 06/13/2019 0321   BILITOT 1.1 06/13/2019 0321   GFRNONAA 10 (L) 06/13/2019 0321   GFRAA 12 (L) 06/13/2019 0321   Lipase     Component Value Date/Time   LIPASE 15 06/11/2019 1243       Studies/Results: CT ABDOMEN PELVIS WO CONTRAST  Result Date: 06/12/2019 CLINICAL DATA:  Worsening abdominal pain, elevated white blood cell count, history of acute sigmoid diverticulitis EXAM: CT ABDOMEN AND PELVIS WITHOUT CONTRAST TECHNIQUE: Multidetector CT imaging of the abdomen and pelvis was performed following the standard protocol without IV contrast. COMPARISON:  06/11/2019 FINDINGS: Lower chest: No acute pleural or parenchymal lung disease. Hepatobiliary: No focal liver abnormality is seen. No gallstones, gallbladder wall thickening, or biliary dilatation. Respiratory motion limits evaluation. Pancreas: Unremarkable. No pancreatic ductal dilatation or surrounding inflammatory changes. Respiratory motion limits evaluation. Spleen: Normal in size without focal abnormality. Adrenals/Urinary Tract: Adrenal glands are unremarkable. Kidneys are normal, without renal calculi, focal lesion, or hydronephrosis. Bladder is unremarkable. Stomach/Bowel: There is continued wall thickening of the mid sigmoid colon, with significant surrounding mesenteric inflammatory change. Punctate foci gas are seen adjacent to the inflamed sigmoid colon. There is increasing fluid in the right lower quadrant with superimposed gas fluid level compatible with  perforation. No organized fluid collection, rim enhancement, or abscess. Vascular/Lymphatic: No significant vascular findings are present. No enlarged abdominal or pelvic lymph nodes. Reproductive: Uterus and bilateral adnexa are unremarkable. Other: Free fluid in the pelvis with gas fluid level consistent with perforation. No organized fluid collection or abscess. No abdominal wall hernia. Musculoskeletal:  No acute or destructive bony lesions. Reconstructed images demonstrate no additional findings. IMPRESSION: 1. Progression of acute sigmoid diverticulitis, now with evidence of perforation. Increasing fluid in the right lower quadrant with gas fluid level. No rim enhancement to suggest organized fluid collection or abscess at this time. These results were called by telephone at the time of interpretation on 06/12/2019 at 3:31 pm to provider [facility] , who verbally acknowledged these results. Electronically Signed   By: Randa Ngo M.D.   On: 06/12/2019 15:41   DG Chest 1 View  Result Date: 06/11/2019 CLINICAL DATA:  59 year old female with history of shortness of breath. Abdominal cramping. EXAM: CHEST  1 VIEW COMPARISON:  Chest x-ray 05/23/2019. FINDINGS: Lung volumes are normal. No consolidative airspace disease. No pleural effusions. No pneumothorax. No pulmonary nodule or mass noted. Pulmonary vasculature and the cardiomediastinal silhouette are within normal limits. IMPRESSION: No radiographic evidence of acute cardiopulmonary disease. Electronically Signed   By: Vinnie Langton M.D.   On: 06/11/2019 17:25   CT ABDOMEN PELVIS W CONTRAST  Result Date: 06/11/2019 CLINICAL DATA:  Abdominal pain EXAM: CT ABDOMEN AND PELVIS WITH CONTRAST TECHNIQUE: Multidetector CT imaging of the abdomen and pelvis was performed using the standard protocol following bolus administration of intravenous contrast. CONTRAST:  112mL OMNIPAQUE IOHEXOL 300 MG/ML  SOLN COMPARISON:  2014 FINDINGS: Lower chest: No acute abnormality. Hepatobiliary: Possible hepatic steatosis. Gallbladder is unremarkable Pancreas: Unremarkable. Spleen: Unremarkable. Adrenals/Urinary Tract: Adrenals, kidneys, and bladder are unremarkable. Stomach/Bowel: Stomach is within normal limits. Bowel is normal in caliber. There are inflammatory changes about the sigmoid colon. Sigmoid diverticulosis is present. Appendix is not definitely visualized.  Vascular/Lymphatic: No significant vascular findings are present. No enlarged abdominal or pelvic lymph nodes. Reproductive: Uterus and bilateral adnexa are unremarkable. Other: Small volume free fluid.  Abdominal wall is unremarkable. Musculoskeletal: Lumbar spine degenerative changes. There is no acute osseous abnormality. IMPRESSION: Acute sigmoid diverticulitis. Small volume free fluid no evidence of abscess. Electronically Signed   By: Macy Mis M.D.   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 DM HTN Cardiomyopathy Morbid obesity BMI 58.39 AKI - Cr 4.37 (Cr 1.66 on 6/3) Urinary Retention - Foley placed 6/4  Acute sigmoid diverticulitis with microperforation - 2nd bout, history of diverticulitis in 2014 - last colonoscopy >10 years ago - CT scan 6/3 shows progression of acute sigmoid diverticulitis now with evidence of perforation, increasing fluid in the right lower quadrant with gas fluid level, no rim enhancement to suggest organized fluid collection or abscess at this time - IR consult for possible drain placement  - Continue IV abx  - Continue bowel rest/NPO - Patient is  extremely high risk for complications if surgery (which would be sigmoid colectomy with colostomy and no guarantee of future colostomy reversal) is needed; including bleeding, infection, pain, scarring, ileus, wound healing problems/ fascial dehiscence/ hernia,  DVT/PE, pneumonia, acute cardiac event, death, etc.  - If she is able  to get over this bout without surgery we would recommend colonoscopy in 6-8 weeks, and she could follow up with one of our colorectal surgeons to discuss elective colectomy.  - Will continue to follow closely   ID - zosyn 6/2>>day#3, WBC 26.1 FEN - IVF, NPO VTE - SCDs, lovenox Foley - In place 6/4 for retention  Follow up - TBD   LOS: 2 days    Jillyn Ledger , [facility] Surgery 06/13/2019, 8:53 AM Please see Amion for pager number during day hours 7:00am-4:30pm

## 2019-06-13 NOTE — Progress Notes (Signed)
PHARMACY NOTE:  ANTIMICROBIAL RENAL DOSAGE ADJUSTMENT  Current antimicrobial regimen includes a mismatch between antimicrobial dosage and estimated renal function.  As per policy approved by the Pharmacy & Therapeutics and Medical Executive Committees, the antimicrobial dosage will be adjusted accordingly.  Current antimicrobial dosage:  Zosyn 3.375g IV every 8 hours  Indication: Diverticulitis  Renal Function: *Normalized CrCl ~18 mL/min  Estimated Creatinine Clearance: 23.9 mL/min (A) (by C-G formula based on SCr of 4.37 mg/dL (H)). []      On intermittent HD, scheduled: []      On CRRT    Antimicrobial dosage has been changed to:  Zosyn 3.375g IV every 12 hours.   Additional comments: Rechecking SCR in AM   Thank you for allowing pharmacy to be a part of this patient's care.  Sloan Leiter, PharmD, BCPS, BCCCP Clinical Pharmacist Please refer to [facility] for [place] numbers 06/13/2019 11:14 AM

## 2019-06-13 NOTE — Progress Notes (Addendum)
PROGRESS NOTE    [name]  OIN:[id number] DOB: [birth year] DOA: 06/11/2019 PCP: Christain Sacramento, MD    Brief Narrative:  59 y.o. 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 Pt reportedly underwent D/C procedure on 5/25. Very shortly after the procedure, pt recalled abdominal "swelling" with subsequent abd pain. Pt believed herself to be constipated, thus was prescribed miralax. Pt had several BM without resolution of symptoms. Symptoms progressed and worsened, prompting visit to ED  ED Course: In the ED, pt was found to have WBC of 15k with HR of 110's with RR in the 20's. CT abd/pelvis was performed, which was notable for sigmoid diverticulitis. Pt was started on zosyn. Hospitalist consulted for consideration for admission.  Assessment & Plan:   Principal Problem:   Acute diverticulitis Active Problems:   Obesity (BMI 30-39.9)   ILD (interstitial lung disease) ([place])   Hypertension  1. Acute sigmoid diverticulitis with sepsis present on admit 1. Pt with leukocytosis, tachycardia, tachypnea 2. Initial CT abd personally reviewed. Findings of acute sigmoid diverticulitis without perforation 3. Pain remained poorly controlled with rising WBC 4. Given immunosuppressed state, have asked ID to follow 5. Given worsening symptoms, repeat CT obtained and reviewed with Radiologist. Findings worrisome for new perforation without abscess collection 6. Consulted General Surgery who recommended IR consult for drain placement.  7. Drain placed on 6/4 with purulent material expressed 8. Repeat CBC in AM 2. DM2 1. Recent a1c of 7.0 2. Holding oral hyperglycemic regimen while in hospital 3. Continue on SSI coverage as needed 4. Glucose trends remain  stable 3. Morbid obesity 1. Recommend diet/lifestyle modification 4. HTN 1. Noted to be mildly hypotensive recently 2. Stopped entresto 3. Transitioned to low dose IV lopressor with hold parameters 4. BP stable thus far 5. 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 Had been on cellcept and methotrexate. Given acute infection, will continue to hold 2. Seems to be stable at present 3. Currently on minimal O2 support 6. Hx microscopic polyangiitis 1. Followed by Rheum at [facility] 2. Historically required high dosed steroids, rituximab, plasmapheresis in the past and ultimately is continued on cellcept and methotrexate 3. Given acute infection, continue to hold immunomodulators at this time 7. Acute renal failure, oliguiric 1. Worsening renal function with Cr up to 4.3 today, decreasing urine output 2. No signs of uremia, electrolytes unremarkable 3. UA on presentation without blood or protein 4. Consulted Nephrology for assistance, appreciate input 5. Continued on hydration 6. Cont to follow renal panel trends  DVT prophylaxis: Lovenox subq Code Status: Full Family Communication: Pt in room, family at bedside  Status is: Inpatient  Remains inpatient appropriate because:Persistent severe electrolyte disturbances, Unsafe d/c plan, IV treatments appropriate due to intensity of illness or inability to take PO and Inpatient level of care appropriate due to severity of illness   Dispo: The patient is from: Home              Anticipated d/c is to: Home              Anticipated d/c date is: > 3 days              Patient currently is not medically stable to d/c.   Consultants:   ID  General Surgery  IR  Nephrology  Procedures:   IR guided drain placement 6/4  Antimicrobials: Anti-infectives (From admission, onward)   Start     Dose/Rate Route Frequency Ordered Stop   06/13/19 2200  piperacillin-tazobactam (ZOSYN) IVPB 3.375 g     3.375 g 12.5 mL/hr over 240 Minutes  Intravenous Every 12 hours 06/13/19 1111     06/11/19 2330  piperacillin-tazobactam (ZOSYN) IVPB 3.375 g  Status:  Discontinued     3.375 g 12.5 mL/hr over 240 Minutes Intravenous Every 8 hours 06/11/19 1852 06/13/19 1111   06/11/19 1715  piperacillin-tazobactam (ZOSYN) IVPB 3.375 g     3.375 g 100 mL/hr over 30 Minutes Intravenous  Once 06/11/19 1714 06/11/19 1942      Subjective: Still complaining of abd pain, feeling of being constipated. Asking for something to drink  Objective: Vitals:   06/13/19 1340 06/13/19 1345 06/13/19 1350 06/13/19 1413  BP: (!) 78/50 93/69 101/77 (!) 98/52  Pulse: 98 99 99 98  Resp: 16 18 18 19   Temp:    98.8 F (37.1 C)  TempSrc:    Oral  SpO2: 96% 98% 97% 94%  Weight:      Height:        Intake/Output Summary (Last 24 hours) at 06/13/2019 1641 Last data filed at 06/13/2019 1000 Gross per 24 hour  Intake 0 ml  Output 0 ml  Net 0 ml   Filed Weights   06/11/19 1128  Weight: (!) 174.2 kg    Examination: General exam: Conversant, in no acute distress Respiratory system: normal chest rise, clear, no audible wheezing Cardiovascular system: regular rhythm, s1-s2 Gastrointestinal system: obese, generally distended, tender throughout Central nervous system: No seizures, no tremors Extremities: No cyanosis, no joint deformities Skin: No rashes, no pallor Psychiatry: Affect normal // no auditory hallucinations   Data Reviewed: I have personally reviewed following labs and imaging studies  CBC: Recent Labs  Lab 06/11/19 1243 06/11/19 2020 06/12/19 0209 06/13/19 0321  WBC 15.3* 21.5* 24.4* 26.1*  NEUTROABS 13.4*  --   --   --   HGB 13.1 12.7 11.6* 10.3*  HCT 43.0 41.9 38.6 34.8*  MCV 87.2 87.3 87.9 89.2  PLT 333 308 333 093   Basic Metabolic Panel: Recent Labs  Lab 06/11/19 1243 06/11/19 2020 06/12/19 0209 06/13/19 0321  NA 139  --  140 138  K 4.4  --  4.2 4.3  CL 102  --  107 104  CO2 25  --  23 20*  GLUCOSE 159*  --  145* 138*   BUN 19  --  19 36*  CREATININE 1.03* 0.98 1.66* 4.37*  CALCIUM 8.9  --  8.2* 7.9*   GFR: Estimated Creatinine Clearance: 23.9 mL/min (A) (by C-G formula based on SCr of 4.37 mg/dL (H)). Liver Function Tests: Recent Labs  Lab 06/11/19 1243 06/12/19 0209 06/13/19 0321  AST 14* 14* 21  ALT 15 12 15   ALKPHOS 107 89 94  BILITOT 0.8 1.4* 1.1  PROT 6.9 5.9* 6.4*  ALBUMIN 3.5 2.9* 2.9*   Recent Labs  Lab 06/11/19 1243  LIPASE 15   No results for input(s): AMMONIA in the last 168 hours. Coagulation Profile: Recent Labs  Lab 06/13/19 1015  INR 1.3*   Cardiac Enzymes: No results for input(s): CKTOTAL, CKMB, CKMBINDEX, TROPONINI in the last 168 hours. BNP (last 3 results) No results for input(s): PROBNP in the last 8760 hours. HbA1C: No results for input(s): HGBA1C in the last 72 hours. CBG: Recent Labs  Lab  06/12/19 2358 06/13/19 0417 06/13/19 0739 06/13/19 1151 06/13/19 1608  GLUCAP 120* 126* 131* 110* 104*   Lipid Profile: No results for input(s): CHOL, HDL, LDLCALC, TRIG, CHOLHDL, LDLDIRECT in the last 72 hours. Thyroid Function Tests: No results for input(s): TSH, T4TOTAL, FREET4, T3FREE, THYROIDAB in the last 72 hours. Anemia Panel: No results for input(s): VITAMINB12, FOLATE, FERRITIN, TIBC, IRON, RETICCTPCT in the last 72 hours. Sepsis Labs: No results for input(s): PROCALCITON, LATICACIDVEN in the last 168 hours.  Recent Results (from the past 240 hour(s))  SARS Coronavirus 2 by RT PCR (hospital order, performed in [facility] hospital lab) Nasopharyngeal Nasopharyngeal Swab     Status: None   Collection Time: 06/11/19  6:16 PM   Specimen: Nasopharyngeal Swab  Result Value Ref Range Status   SARS Coronavirus 2 NEGATIVE NEGATIVE Final    Comment: (NOTE) SARS-CoV-2 target nucleic acids are NOT DETECTED. The SARS-CoV-2 RNA is generally detectable in upper and lower respiratory specimens during the acute phase of infection. The lowest concentration of  SARS-CoV-2 viral copies this assay can detect is 250 copies / mL. A negative result does not preclude SARS-CoV-2 infection and should not be used as the sole basis for treatment or other patient management decisions.  A negative result may occur with improper specimen collection / handling, submission of specimen other than nasopharyngeal swab, presence of viral mutation(s) within the areas targeted by this assay, and inadequate number of viral copies (<250 copies / mL). A negative result must be combined with clinical observations, patient history, and epidemiological information. Fact Sheet for Patients:   StrictlyIdeas.no Fact Sheet for Healthcare Providers: BankingDealers.co.za This test is not yet approved or cleared  by the [place] FDA and has been authorized for detection and/or diagnosis of SARS-CoV-2 by FDA under an Emergency Use Authorization (EUA).  This EUA will remain in effect (meaning this test can be used) for the duration of the COVID-19 declaration under Section 564(b)(1) of the Act, 21 U.S.C. section 360bbb-3(b)(1), unless the authorization is terminated or revoked sooner. Performed at [facility], [place] [address], [place], [place] [address]   Culture, blood (Routine X 2) w Reflex to ID Panel     Status: None (Preliminary result)   Collection Time: 06/11/19  8:20 PM   Specimen: BLOOD  Result Value Ref Range Status   Specimen Description BLOOD BLOOD LEFT FOREARM  Final   Special Requests   Final    BOTTLES DRAWN AEROBIC AND ANAEROBIC Blood Culture results may not be optimal due to an inadequate volume of blood received in culture bottles   Culture   Final    NO GROWTH 2 DAYS Performed at [facility], [place] [address], [place], [place] [address]    Report Status PENDING  Incomplete  Culture, blood (Routine X 2) w Reflex to ID Panel     Status: None (Preliminary result)   Collection Time: 06/11/19 10:00  PM   Specimen: BLOOD  Result Value Ref Range Status   Specimen Description BLOOD RIGHT HAND  Final   Special Requests   Final    BOTTLES DRAWN AEROBIC ONLY Blood Culture results may not be optimal due to an inadequate volume of blood received in culture bottles   Culture   Final    NO GROWTH 2 DAYS Performed at [facility], [place] [address] [place], [place] [address]    Report Status PENDING  Incomplete     Radiology Studies: CT ABDOMEN PELVIS WO  CONTRAST  Result Date: 06/12/2019 CLINICAL DATA:  Worsening abdominal pain, elevated white blood cell count, history of acute sigmoid diverticulitis EXAM: CT ABDOMEN AND PELVIS WITHOUT CONTRAST TECHNIQUE: Multidetector CT imaging of the abdomen and pelvis was performed following the standard protocol without IV contrast. COMPARISON:  06/11/2019 FINDINGS: Lower chest: No acute pleural or parenchymal lung disease. Hepatobiliary: No focal liver abnormality is seen. No gallstones, gallbladder wall thickening, or biliary dilatation. Respiratory motion limits evaluation. Pancreas: Unremarkable. No pancreatic ductal dilatation or surrounding inflammatory changes. Respiratory motion limits evaluation. Spleen: Normal in size without focal abnormality. Adrenals/Urinary Tract: Adrenal glands are unremarkable. Kidneys are normal, without renal calculi, focal lesion, or hydronephrosis. Bladder is unremarkable. Stomach/Bowel: There is continued wall thickening of the mid sigmoid colon, with significant surrounding mesenteric inflammatory change. Punctate foci gas are seen adjacent to the inflamed sigmoid colon. There is increasing fluid in the right lower quadrant with superimposed gas fluid level compatible with perforation. No organized fluid collection, rim enhancement, or abscess. Vascular/Lymphatic: No significant vascular findings are present. No enlarged abdominal or pelvic lymph nodes. Reproductive: Uterus and bilateral adnexa are unremarkable. Other: Free  fluid in the pelvis with gas fluid level consistent with perforation. No organized fluid collection or abscess. No abdominal wall hernia. Musculoskeletal: No acute or destructive bony lesions. Reconstructed images demonstrate no additional findings. IMPRESSION: 1. Progression of acute sigmoid diverticulitis, now with evidence of perforation. Increasing fluid in the right lower quadrant with gas fluid level. No rim enhancement to suggest organized fluid collection or abscess at this time. These results were called by telephone at the time of interpretation on 06/12/2019 at 3:31 pm to provider [facility] , who verbally acknowledged these results. Electronically Signed   By: Randa Ngo M.D.   On: 06/12/2019 15:41   DG Chest 1 View  Result Date: 06/11/2019 CLINICAL DATA:  59 year old female with history of shortness of breath. Abdominal cramping. EXAM: CHEST  1 VIEW COMPARISON:  Chest x-ray 05/23/2019. FINDINGS: Lung volumes are normal. No consolidative airspace disease. No pleural effusions. No pneumothorax. No pulmonary nodule or mass noted. Pulmonary vasculature and the cardiomediastinal silhouette are within normal limits. IMPRESSION: No radiographic evidence of acute cardiopulmonary disease. Electronically Signed   By: Vinnie Langton M.D.   On: 06/11/2019 17:25   CT ABDOMEN PELVIS W CONTRAST  Result Date: 06/11/2019 CLINICAL DATA:  Abdominal pain EXAM: CT ABDOMEN AND PELVIS WITH CONTRAST TECHNIQUE: Multidetector CT imaging of the abdomen and pelvis was performed using the standard protocol following bolus administration of intravenous contrast. CONTRAST:  121mL OMNIPAQUE IOHEXOL 300 MG/ML  SOLN COMPARISON:  2014 FINDINGS: Lower chest: No acute abnormality. Hepatobiliary: Possible hepatic steatosis. Gallbladder is unremarkable Pancreas: Unremarkable. Spleen: Unremarkable. Adrenals/Urinary Tract: Adrenals, kidneys, and bladder are unremarkable. Stomach/Bowel: Stomach is within normal limits. Bowel is  normal in caliber. There are inflammatory changes about the sigmoid colon. Sigmoid diverticulosis is present. Appendix is not definitely visualized. Vascular/Lymphatic: No significant vascular findings are present. No enlarged abdominal or pelvic lymph nodes. Reproductive: Uterus and bilateral adnexa are unremarkable. Other: Small volume free fluid.  Abdominal wall is unremarkable. Musculoskeletal: Lumbar spine degenerative changes. There is no acute osseous abnormality. IMPRESSION: Acute sigmoid diverticulitis. Small volume free fluid no evidence of abscess. Electronically Signed   By: Macy Mis M.D.   On: 06/11/2019 17:05   CT IMAGE GUIDED DRAINAGE BY PERCUTANEOUS CATHETER  Result Date: 06/13/2019 CLINICAL DATA:  Worsening diverticular abscess. EXAM: CT GUIDED DRAINAGE OF PELVIC ABSCESS ANESTHESIA/SEDATION: Intravenous Fentanyl 35mcg and Versed  1mg  were administered as conscious sedation during continuous monitoring of the patient's level of consciousness and physiological / cardiorespiratory status by the radiology RN, with a total moderate sedation time of 28 minutes. PROCEDURE: The procedure, risks, benefits, and alternatives were explained to the patient. Questions regarding the procedure were encouraged and answered. The patient understands and consents to the procedure. Select axial scans through the pelvis were obtained. The right pelvic fluid collection was localized and an appropriate skin entry site was determined and marked. The operative field was prepped with chlorhexidinein a sterile fashion, and a sterile drape was applied covering the operative field. A sterile gown and sterile gloves were used for the procedure. Local anesthesia was provided with 1% Lidocaine. Under CT fluoroscopic and helical CT guidance, 18 gauge trocar needle advanced into the collection. Thin cloudy fluid could be aspirated. Amplatz guidewire advanced easily within the collection, position confirmed on CT. Tract  dilated to facilitate placement of a 12 French pigtail drain catheter, formed centrally within the collection. Position confirmed on CT. 22 mL cloudy yellow aspirate sent for Gram stain and culture. Catheter secured externally with 0 Prolene suture and StatLock and placed to gravity drain bag. The patient tolerated the procedure well. COMPLICATIONS: None immediate FINDINGS: Right pelvic gas and fluid collection was localized corresponding to see previous CT findings. 12 French pigtail drain catheter placed as above. 22 mL cloudy yellow aspirate sent for Gram stain and culture. IMPRESSION: Technically successful CT-guided pelvic abscess drain catheter placement . Electronically Signed   By: Lucrezia Europe M.D.   On: 06/13/2019 16:18    Scheduled Meds: . buPROPion  450 mg Oral Daily  . Chlorhexidine Gluconate Cloth  6 each Topical Daily  . citalopram  40 mg Oral Daily  . enoxaparin (LOVENOX) injection  40 mg Subcutaneous Q24H  . fentaNYL      . insulin aspart  0-9 Units Subcutaneous Q4H  . metoprolol tartrate  2.5 mg Intravenous Q6H  . midazolam      . pantoprazole (PROTONIX) IV  40 mg Intravenous Q24H   Continuous Infusions: . sodium chloride 125 mL/hr at 06/13/19 1631  . piperacillin-tazobactam (ZOSYN)  IV    . sodium chloride Stopped (06/12/19 0949)     LOS: 2 days   Marylu Lund, MD [facility] Pager On Amion  If 7PM-7AM, please contact night-coverage 06/13/2019, 4:41 PM

## 2019-06-14 DIAGNOSIS — I10 Essential (primary) hypertension: Secondary | ICD-10-CM

## 2019-06-14 DIAGNOSIS — N170 Acute kidney failure with tubular necrosis: Secondary | ICD-10-CM | POA: Diagnosis not present

## 2019-06-14 LAB — RENAL FUNCTION PANEL
Albumin: 2.5 g/dL — ABNORMAL LOW (ref 3.5–5.0)
Anion gap: 15 (ref 5–15)
BUN: 53 mg/dL — ABNORMAL HIGH (ref 6–20)
CO2: 18 mmol/L — ABNORMAL LOW (ref 22–32)
Calcium: 7.4 mg/dL — ABNORMAL LOW (ref 8.9–10.3)
Chloride: 102 mmol/L (ref 98–111)
Creatinine, Ser: 5.75 mg/dL — ABNORMAL HIGH (ref 0.44–1.00)
GFR calc Af Amer: 9 mL/min — ABNORMAL LOW (ref >60)
GFR calc non Af Amer: 7 mL/min — ABNORMAL LOW (ref >60)
Glucose, Bld: 101 mg/dL — ABNORMAL HIGH (ref 70–99)
Phosphorus: 4.8 mg/dL — ABNORMAL HIGH (ref 2.5–4.6)
Potassium: 4.1 mmol/L (ref 3.5–5.1)
Sodium: 135 mmol/L (ref 135–145)

## 2019-06-14 LAB — CBC
HCT: 30.7 % — ABNORMAL LOW (ref 36.0–46.0)
Hemoglobin: 9.3 g/dL — ABNORMAL LOW (ref 12.0–15.0)
MCH: 26.6 pg (ref 26.0–34.0)
MCHC: 30.3 g/dL (ref 30.0–36.0)
MCV: 87.7 fL (ref 80.0–100.0)
Platelets: 277 10*3/uL (ref 150–400)
RBC: 3.5 MIL/uL — ABNORMAL LOW (ref 3.87–5.11)
RDW: 14.9 % (ref 11.5–15.5)
WBC: 20 10*3/uL — ABNORMAL HIGH (ref 4.0–10.5)
nRBC: 0 % (ref 0.0–0.2)

## 2019-06-14 LAB — GLUCOSE, CAPILLARY
Glucose-Capillary: 103 mg/dL — ABNORMAL HIGH (ref 70–99)
Glucose-Capillary: 109 mg/dL — ABNORMAL HIGH (ref 70–99)
Glucose-Capillary: 73 mg/dL (ref 70–99)
Glucose-Capillary: 83 mg/dL (ref 70–99)
Glucose-Capillary: 91 mg/dL (ref 70–99)
Glucose-Capillary: 98 mg/dL (ref 70–99)

## 2019-06-14 MED ORDER — SODIUM CHLORIDE 0.9 % IV SOLN
2.0000 g | INTRAVENOUS | Status: DC
  Administered 2019-06-14 – 2019-06-27 (×14): 2 g via INTRAVENOUS
  Filled 2019-06-14 (×6): qty 20
  Filled 2019-06-14: qty 2
  Filled 2019-06-14 (×7): qty 20

## 2019-06-14 MED ORDER — METRONIDAZOLE IN NACL 5-0.79 MG/ML-% IV SOLN
500.0000 mg | Freq: Three times a day (TID) | INTRAVENOUS | Status: DC
  Administered 2019-06-14 – 2019-06-20 (×18): 500 mg via INTRAVENOUS
  Filled 2019-06-14 (×18): qty 100

## 2019-06-14 MED ORDER — ENOXAPARIN SODIUM 30 MG/0.3ML ~~LOC~~ SOLN
30.0000 mg | SUBCUTANEOUS | Status: DC
  Administered 2019-06-14 – 2019-06-23 (×9): 30 mg via SUBCUTANEOUS
  Filled 2019-06-14 (×10): qty 0.3

## 2019-06-14 NOTE — Progress Notes (Signed)
[name] NEPHROLOGY PROGRESS NOTE  Assessment/ Plan: Pt is a 59 y.o. 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  #Acute kidney injury likely due to hemodynamically mediated due to hypotension, entresto, infection and contrast nephropathy.  Now probably progressed to ATN. She was on meloxicam at home, currently on hold. The urinalysis and serum creatinine level was normal on admission therefore no GN (h/o ANCA vasculitis).  CT scan ruled out obstruction. Remains anuric and serum creatinine level continue to rise to 5.75.  I will order bladder scan although she has Foley catheter.  Continue NS at 125 cc an hour.  Electrolytes are acceptable and she has no uremic feature therefore plan to continue to monitor.  Hopefully, in next 1 to 2 days she will start passing urine and improve creatinine level. I have discussed with the patient that if she does not pass urine and or renal function worsen then she will need dialysis.  Patient agreed for dialysis when needed. Avoid hypotensive episode.  #Sepsis due to perforated diverticulitis/intra-abdominal infection: Seen by general surgery thought to be very high risk for surgical intervention.  Status post right lower quadrant drain placed by IR on 6/4.  Currently on IV Zosyn.  #History of ANCA vasculitis/microscopic polyangiitis: Reportedly she was treated with steroids, plasmapheresis, rituximab in the past.  She is currently on CellCept and methotrexate which is on hold because of sepsis and AKI.  She follows with rheumatology.  #Hypertension: Became hypotensive and held [place].  Continue IV fluid.  Monitor  blood pressure  Discussed with the primary team and the nurse.  Subjective: Seen and examined at bedside.  Denies nausea vomiting chest pain shortness of breath.  No urine output recorded.  Complaining of some abdominal discomfort. Objective Vital signs in last 24 hours: Vitals:   06/14/19 0203 06/14/19 0418 06/14/19 0555 06/14/19 0951  BP: 100/68 101/72 100/65 106/61  Pulse: 95 96 95 91  Resp: 17 18 18 18   Temp: 98.6 F (37 C) 98.8 F (37.1 C) 98.7 F (37.1 C) 98.9 F (37.2 C)  TempSrc: Oral Oral Oral Oral  SpO2: 93% 95% 92% 91%  Weight:      Height:       Weight change:   Intake/Output Summary (Last 24 hours) at 06/14/2019 1042 Last data filed at 06/14/2019 0900 Gross per 24 hour  Intake 2322.19 ml  Output 180 ml  Net 2142.19 ml       Labs: Basic Metabolic Panel: Recent Labs  Lab 06/12/19 0209 06/13/19 0321 06/14/19 0121  NA 140 138 135  K 4.2 4.3 4.1  CL 107 104 102  CO2 23 20* 18*  GLUCOSE 145* 138* 101*  BUN 19 36* 53*  CREATININE 1.66* 4.37* 5.75*  CALCIUM 8.2* 7.9* 7.4*  PHOS  --   --  4.8*   Liver Function Tests: Recent Labs  Lab 06/11/19 1243 06/11/19 1243 06/12/19 0209 06/13/19 0321 06/14/19 0121  AST 14*  --  14* 21  --   ALT 15  --  12 15  --   ALKPHOS 107  --  89 94  --   BILITOT 0.8  --  1.4* 1.1  --   PROT 6.9  --  5.9* 6.4*  --  ALBUMIN 3.5   < > 2.9* 2.9* 2.5*   < > = values in this interval not displayed.   Recent Labs  Lab 06/11/19 1243  LIPASE 15   No results for input(s): AMMONIA in the last 168 hours. CBC: Recent Labs  Lab 06/11/19 1243 06/11/19 1243 06/11/19 2020 06/11/19 2020 06/12/19 0209 06/13/19 0321 06/14/19 0121  WBC 15.3*   < > 21.5*   < > 24.4* 26.1* 20.0*  NEUTROABS 13.4*  --   --   --   --   --   --   HGB 13.1   < > 12.7   < > 11.6* 10.3* 9.3*  HCT 43.0   < > 41.9   < > 38.6 34.8* 30.7*  MCV 87.2  --  87.3  --  87.9 89.2 87.7  PLT 333   < > 308   < > 333 287 277   < > = values in this interval not  displayed.   Cardiac Enzymes: No results for input(s): CKTOTAL, CKMB, CKMBINDEX, TROPONINI in the last 168 hours. CBG: Recent Labs  Lab 06/13/19 1608 06/13/19 2006 06/14/19 0002 06/14/19 0414 06/14/19 0819  GLUCAP 104* 111* 109* 103* 98    Iron Studies: No results for input(s): IRON, TIBC, TRANSFERRIN, FERRITIN in the last 72 hours. Studies/Results: CT ABDOMEN PELVIS WO CONTRAST  Result Date: 06/12/2019 CLINICAL DATA:  Worsening abdominal pain, elevated white blood cell count, history of acute sigmoid diverticulitis EXAM: CT ABDOMEN AND PELVIS WITHOUT CONTRAST TECHNIQUE: Multidetector CT imaging of the abdomen and pelvis was performed following the standard protocol without IV contrast. COMPARISON:  06/11/2019 FINDINGS: Lower chest: No acute pleural or parenchymal lung disease. Hepatobiliary: No focal liver abnormality is seen. No gallstones, gallbladder wall thickening, or biliary dilatation. Respiratory motion limits evaluation. Pancreas: Unremarkable. No pancreatic ductal dilatation or surrounding inflammatory changes. Respiratory motion limits evaluation. Spleen: Normal in size without focal abnormality. Adrenals/Urinary Tract: Adrenal glands are unremarkable. Kidneys are normal, without renal calculi, focal lesion, or hydronephrosis. Bladder is unremarkable. Stomach/Bowel: There is continued wall thickening of the mid sigmoid colon, with significant surrounding mesenteric inflammatory change. Punctate foci gas are seen adjacent to the inflamed sigmoid colon. There is increasing fluid in the right lower quadrant with superimposed gas fluid level compatible with perforation. No organized fluid collection, rim enhancement, or abscess. Vascular/Lymphatic: No significant vascular findings are present. No enlarged abdominal or pelvic lymph nodes. Reproductive: Uterus and bilateral adnexa are unremarkable. Other: Free fluid in the pelvis with gas fluid level consistent with perforation. No organized  fluid collection or abscess. No abdominal wall hernia. Musculoskeletal: No acute or destructive bony lesions. Reconstructed images demonstrate no additional findings. IMPRESSION: 1. Progression of acute sigmoid diverticulitis, now with evidence of perforation. Increasing fluid in the right lower quadrant with gas fluid level. No rim enhancement to suggest organized fluid collection or abscess at this time. These results were called by telephone at the time of interpretation on 06/12/2019 at 3:31 pm to provider [facility] , who verbally acknowledged these results. Electronically Signed   By: Randa Ngo M.D.   On: 06/12/2019 15:41   CT IMAGE GUIDED DRAINAGE BY PERCUTANEOUS CATHETER  Result Date: 06/13/2019 CLINICAL DATA:  Worsening diverticular abscess. EXAM: CT GUIDED DRAINAGE OF PELVIC ABSCESS ANESTHESIA/SEDATION: Intravenous Fentanyl 74mcg and Versed 1mg  were administered as conscious sedation during continuous monitoring of the patient's level of consciousness and physiological / cardiorespiratory status by the radiology RN, with a total moderate sedation time of 28 minutes.  PROCEDURE: The procedure, risks, benefits, and alternatives were explained to the patient. Questions regarding the procedure were encouraged and answered. The patient understands and consents to the procedure. Select axial scans through the pelvis were obtained. The right pelvic fluid collection was localized and an appropriate skin entry site was determined and marked. The operative field was prepped with chlorhexidinein a sterile fashion, and a sterile drape was applied covering the operative field. A sterile gown and sterile gloves were used for the procedure. Local anesthesia was provided with 1% Lidocaine. Under CT fluoroscopic and helical CT guidance, 18 gauge trocar needle advanced into the collection. Thin cloudy fluid could be aspirated. Amplatz guidewire advanced easily within the collection, position confirmed on CT. Tract  dilated to facilitate placement of a 12 French pigtail drain catheter, formed centrally within the collection. Position confirmed on CT. 22 mL cloudy yellow aspirate sent for Gram stain and culture. Catheter secured externally with 0 Prolene suture and StatLock and placed to gravity drain bag. The patient tolerated the procedure well. COMPLICATIONS: None immediate FINDINGS: Right pelvic gas and fluid collection was localized corresponding to see previous CT findings. 12 French pigtail drain catheter placed as above. 22 mL cloudy yellow aspirate sent for Gram stain and culture. IMPRESSION: Technically successful CT-guided pelvic abscess drain catheter placement . Electronically Signed   By: Lucrezia Europe M.D.   On: 06/13/2019 16:18    Medications: Infusions: . sodium chloride 125 mL/hr at 06/14/19 0911  . piperacillin-tazobactam (ZOSYN)  IV 3.375 g (06/14/19 0912)  . sodium chloride Stopped (06/12/19 0949)    Scheduled Medications: . buPROPion  450 mg Oral Daily  . Chlorhexidine Gluconate Cloth  6 each Topical Daily  . citalopram  40 mg Oral Daily  . enoxaparin (LOVENOX) injection  40 mg Subcutaneous Q24H  . insulin aspart  0-9 Units Subcutaneous Q4H  . metoprolol tartrate  2.5 mg Intravenous Q6H  . pantoprazole (PROTONIX) IV  40 mg Intravenous Q24H  . sodium chloride flush  5 mL Intracatheter Q8H    have reviewed scheduled and prn medications.  Physical Exam: General:NAD, comfortable Heart:RRR, s1s2 nl, no rubs Lungs:clear b/l, no crackle Abdomen:soft, non-distended, right lower quadrant drain Extremities:No LE edema Neurology: Alert awake and following commands, no asterixis  [name] 06/14/2019,10:42 AM  LOS: 3 days  Pager: [phone]

## 2019-06-14 NOTE — Significant Event (Signed)
Rapid Response Event Note  Overview: Time Called: 1346 Arrival Time: 1355 Event Type: Other (Comment)(Second set of eyes) Pt had acute hypotensive episode (89/46 at 1301), HR NSR-88, afebrile, no change in mentation, no distress.  Initial Focused Assessment: Pt lying in bed. Awake, alert, oriented x4. Pt is able to follow commands and moves extremities equally. Lung sounds are clear, diminished. Breathing is regular, unlabored. Abdomen is soft, bowel sounds are faint. Pt states her abdomen is more round than it usually is. Brown, purulent drainage noted in RLQ drain. Pt has had minimal urine output and urine is tea colored. 1+ edema noted in bilateral lower extremities.   VS at 1409: T 97.7, BP 100/52, HR 81, RR 18, SpO2 94% on room air  Interventions: No intervention from RR RN.  Plan of Care (if not transferred): -New order placed by MD for transfer to PCU.   -Bladder scan -Monitor BP per unit protocol -Record intake and output  Call rapid response RN for additional needs  Event Summary: Name of Physician Notified: Dr. Wyline Copas at 1301(Notified by primary RN) Outcome: Transferred (Comment)(transfer written for PCU) Event End Time: [place]

## 2019-06-14 NOTE — Progress Notes (Signed)
[facility] Office:  [phone] General Surgery Progress Note   LOS: 3 days  POD -     Assessment and Plan: 1.  Diverticulitis with perforation  Perc drain - 06/13/2019  WBC - 20,000 - 06/14/2019 (better)  Zosyn  Continue NPO for now  2.  AKI -   Cr - 5.75 - 06/14/2019  3.  ANCA - associated vasculitis 4.  ILD for which she is on chronic immunosuppression 5.  DM 6.  HTN 7.  Cardiomyopathy 8.  Morbid obesity - BMI 58.39 9.  Anemia  Hgb - 9.3 - 6/5/201 9.  DVT prophylaxis - Lovenox   Principal Problem:   Acute diverticulitis Active Problems:   Obesity (BMI 30-39.9)   ILD (interstitial lung disease) ([place])   Hypertension  Subjective:  Wants something to drink.  Pain questionably better.  Her husband is a Pharmacist, [place] the road right [place].  Objective:   Vitals:   06/14/19 0418 06/14/19 0555  BP: 101/72 100/65  Pulse: 96 95  Resp: 18 18  Temp: 98.8 F (37.1 C) 98.7 F (37.1 C)  SpO2: 95% 92%     Intake/Output from previous day:  06/04 0701 - 06/05 0700 In: 2262.2 [I.V.:2252.2] Out: 180 [Drains:180]  Intake/Output this shift:  No intake/output data recorded.   Physical Exam:   General: Morbidly obese WF who is alert and oriented.    HEENT: Normal. Pupils equal. .   Lungs: Clear   Abdomen: Rare BS,  Drain in RLQ   Lab Results:    Recent Labs    06/13/19 0321 06/14/19 0121  WBC 26.1* 20.0*  HGB 10.3* 9.3*  HCT 34.8* 30.7*  PLT 287 277    BMET   Recent Labs    06/13/19 0321 06/14/19 0121  NA 138 135  K 4.3 4.1  CL 104 102  CO2 20* 18*  GLUCOSE 138* 101*  BUN 36* 53*  CREATININE 4.37* 5.75*  CALCIUM 7.9* 7.4*    PT/INR   Recent Labs    06/13/19 1015  LABPROT 15.8*  INR 1.3*    ABG  No results for input(s): PHART, HCO3 in the last 72 hours.  Invalid input(s): PCO2, PO2   Studies/Results:  CT ABDOMEN PELVIS WO CONTRAST  Result Date: 06/12/2019 CLINICAL DATA:  Worsening abdominal pain, elevated white blood cell count,  history of acute sigmoid diverticulitis EXAM: CT ABDOMEN AND PELVIS WITHOUT CONTRAST TECHNIQUE: Multidetector CT imaging of the abdomen and pelvis was performed following the standard protocol without IV contrast. COMPARISON:  06/11/2019 FINDINGS: Lower chest: No acute pleural or parenchymal lung disease. Hepatobiliary: No focal liver abnormality is seen. No gallstones, gallbladder wall thickening, or biliary dilatation. Respiratory motion limits evaluation. Pancreas: Unremarkable. No pancreatic ductal dilatation or surrounding inflammatory changes. Respiratory motion limits evaluation. Spleen: Normal in size without focal abnormality. Adrenals/Urinary Tract: Adrenal glands are unremarkable. Kidneys are normal, without renal calculi, focal lesion, or hydronephrosis. Bladder is unremarkable. Stomach/Bowel: There is continued wall thickening of the mid sigmoid colon, with significant surrounding mesenteric inflammatory change. Punctate foci gas are seen adjacent to the inflamed sigmoid colon. There is increasing fluid in the right lower quadrant with superimposed gas fluid level compatible with perforation. No organized fluid collection, rim enhancement, or abscess. Vascular/Lymphatic: No significant vascular findings are present. No enlarged abdominal or pelvic lymph nodes. Reproductive: Uterus and bilateral adnexa are unremarkable. Other: Free fluid in the pelvis with gas fluid level consistent with perforation. No organized fluid collection or abscess. No abdominal wall  hernia. Musculoskeletal: No acute or destructive bony lesions. Reconstructed images demonstrate no additional findings. IMPRESSION: 1. Progression of acute sigmoid diverticulitis, now with evidence of perforation. Increasing fluid in the right lower quadrant with gas fluid level. No rim enhancement to suggest organized fluid collection or abscess at this time. These results were called by telephone at the time of interpretation on 06/12/2019 at 3:31  pm to provider [facility] , who verbally acknowledged these results. Electronically Signed   By: Randa Ngo M.D.   On: 06/12/2019 15:41   CT IMAGE GUIDED DRAINAGE BY PERCUTANEOUS CATHETER  Result Date: 06/13/2019 CLINICAL DATA:  Worsening diverticular abscess. EXAM: CT GUIDED DRAINAGE OF PELVIC ABSCESS ANESTHESIA/SEDATION: Intravenous Fentanyl 38mcg and Versed 1mg  were administered as conscious sedation during continuous monitoring of the patient's level of consciousness and physiological / cardiorespiratory status by the radiology RN, with a total moderate sedation time of 28 minutes. PROCEDURE: The procedure, risks, benefits, and alternatives were explained to the patient. Questions regarding the procedure were encouraged and answered. The patient understands and consents to the procedure. Select axial scans through the pelvis were obtained. The right pelvic fluid collection was localized and an appropriate skin entry site was determined and marked. The operative field was prepped with chlorhexidinein a sterile fashion, and a sterile drape was applied covering the operative field. A sterile gown and sterile gloves were used for the procedure. Local anesthesia was provided with 1% Lidocaine. Under CT fluoroscopic and helical CT guidance, 18 gauge trocar needle advanced into the collection. Thin cloudy fluid could be aspirated. Amplatz guidewire advanced easily within the collection, position confirmed on CT. Tract dilated to facilitate placement of a 12 French pigtail drain catheter, formed centrally within the collection. Position confirmed on CT. 22 mL cloudy yellow aspirate sent for Gram stain and culture. Catheter secured externally with 0 Prolene suture and StatLock and placed to gravity drain bag. The patient tolerated the procedure well. COMPLICATIONS: None immediate FINDINGS: Right pelvic gas and fluid collection was localized corresponding to see previous CT findings. 12 French pigtail drain  catheter placed as above. 22 mL cloudy yellow aspirate sent for Gram stain and culture. IMPRESSION: Technically successful CT-guided pelvic abscess drain catheter placement . Electronically Signed   By: Lucrezia Europe M.D.   On: 06/13/2019 16:18     Anti-infectives:   Anti-infectives (From admission, onward)   Start     Dose/Rate Route Frequency Ordered Stop   06/13/19 2200  piperacillin-tazobactam (ZOSYN) IVPB 3.375 g     3.375 g 12.5 mL/hr over 240 Minutes Intravenous Every 12 hours 06/13/19 1111     06/11/19 2330  piperacillin-tazobactam (ZOSYN) IVPB 3.375 g  Status:  Discontinued     3.375 g 12.5 mL/hr over 240 Minutes Intravenous Every 8 hours 06/11/19 1852 06/13/19 1111   06/11/19 1715  piperacillin-tazobactam (ZOSYN) IVPB 3.375 g     3.375 g 100 mL/hr over 30 Minutes Intravenous  Once 06/11/19 1714 06/11/19 1942      Alphonsa Overall, MD, [facility] Surgery Office: [phone] 06/14/2019

## 2019-06-14 NOTE — Progress Notes (Signed)
Patient admitted to [facility]. Alert and oriented x 4. Oriented to the unit, call bell, & bed alarm. Skin assessed.

## 2019-06-14 NOTE — Progress Notes (Signed)
PROGRESS NOTE    [name]  GHW:[id number] DOB: [birth year] DOA: 06/11/2019 PCP: Christain [place], MD    Brief Narrative:  59 y.o. 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 Pt reportedly underwent D/C procedure on 5/25. Very shortly after the procedure, pt recalled abdominal "swelling" with subsequent abd pain. Pt believed herself to be constipated, thus was prescribed miralax. Pt had several BM without resolution of symptoms. Symptoms progressed and worsened, prompting visit to ED  ED Course: In the ED, pt was found to have WBC of 15k with HR of 110's with RR in the 20's. CT abd/pelvis was performed, which was notable for sigmoid diverticulitis. Pt was started on zosyn. Hospitalist consulted for consideration for admission.  Assessment & Plan:   Principal Problem:   Acute diverticulitis Active Problems:   Obesity (BMI 30-39.9)   ILD (interstitial lung disease) ([place])   Hypertension   Acute kidney injury (AKI) with acute tubular necrosis (ATN) ([place])  1. Acute sigmoid diverticulitis with sepsis present on admit 1. Pt with leukocytosis, tachycardia, tachypnea 2. Initial CT abd personally reviewed. Findings of acute sigmoid diverticulitis without perforation 3. Given immunosuppressed state, have asked ID to follow 4. Given worsening symptoms, repeat CT obtained and reviewed with Radiologist. Findings worrisome for new perforation without abscess collection 5. Consulted General Surgery who recommended IR consult for drain placement.  6. Drain placed on 6/4 with purulent material expressed 7. WBC improving 8. Had been continued on zosyn, however now changed to rocephin with flagyl given below renal failure 2. DM2 1. Recent a1c of  7.0 2. Holding oral hyperglycemic regimen while in hospital 3. Continue on SSI coverage as needed 4. Glucose trends thus far remain stable 3. Morbid obesity 1. Recommend diet/lifestyle modification 4. HTN 1. Stopped entresto 2. Transitioned to low dose IV lopressor with hold parameters 3. BP remains soft, would try to avoid large fluid boluses if possible given worsening renal function below 5. 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 Had been on cellcept and methotrexate. Given acute infection, will continue to hold 2. Seems to be stable at present 3. Currently on minimal O2 support 6. Hx microscopic polyangiitis 1. Followed by Rheum at [facility] 2. Historically required high dosed steroids, rituximab, plasmapheresis in the past and ultimately is continued on cellcept and methotrexate 3. Given acute infection, continue to hold immunomodulators at this time 7. Acute renal failure, oliguiric 1. Worsening renal function with Cr up to 4.3 today, decreasing urine output 2. Suspected ATN 3. No signs of uremia, electrolytes unremarkable 4. UA on presentation without blood or protein 5. Consulted Nephrology for assistance, appreciate input 6. Continued on gentle hydration 7. Cr up to over 5 today with minimal urine output 8. Per nephrology, temporary HD can be considered while awaiting return of renal function, if needed  DVT prophylaxis: Lovenox subq Code Status: Full Family Communication: Pt in room, family at bedside  Status is: Inpatient  Remains inpatient appropriate because:Persistent severe electrolyte disturbances, Unsafe d/c plan, IV treatments appropriate due to intensity of illness or inability to take PO and Inpatient level of care appropriate due to severity of illness   Dispo: The patient is from: Home              Anticipated d/c is to: Home  Anticipated d/c date is: > 3 days              Patient currently is not medically stable to d/c.   Consultants:    ID  General Surgery  IR  Nephrology  Procedures:   IR guided drain placement 6/4  Antimicrobials: Anti-infectives (From admission, onward)   Start     Dose/Rate Route Frequency Ordered Stop   06/14/19 1330  cefTRIAXone (ROCEPHIN) 2 g in sodium chloride 0.9 % 100 mL IVPB     2 g 200 mL/hr over 30 Minutes Intravenous Every 24 hours 06/14/19 1316     06/14/19 1330  metroNIDAZOLE (FLAGYL) IVPB 500 mg     500 mg 100 mL/hr over 60 Minutes Intravenous Every 8 hours 06/14/19 1316     06/13/19 2200  piperacillin-tazobactam (ZOSYN) IVPB 3.375 g  Status:  Discontinued     3.375 g 12.5 mL/hr over 240 Minutes Intravenous Every 12 hours 06/13/19 1111 06/14/19 1316   06/11/19 2330  piperacillin-tazobactam (ZOSYN) IVPB 3.375 g  Status:  Discontinued     3.375 g 12.5 mL/hr over 240 Minutes Intravenous Every 8 hours 06/11/19 1852 06/13/19 1111   06/11/19 1715  piperacillin-tazobactam (ZOSYN) IVPB 3.375 g     3.375 g 100 mL/hr over 30 Minutes Intravenous  Once 06/11/19 1714 06/11/19 1942      Subjective: Asking about ice chips this AM  Objective: Vitals:   06/14/19 0203 06/14/19 0418 06/14/19 0555 06/14/19 0951  BP: 100/68 101/72 100/65 106/61  Pulse: 95 96 95 91  Resp: 17 18 18 18   Temp: 98.6 F (37 C) 98.8 F (37.1 C) 98.7 F (37.1 C) 98.9 F (37.2 C)  TempSrc: Oral Oral Oral Oral  SpO2: 93% 95% 92% 91%  Weight:      Height:        Intake/Output Summary (Last 24 hours) at 06/14/2019 1350 Last data filed at 06/14/2019 0900 Gross per 24 hour  Intake 2322.19 ml  Output 180 ml  Net 2142.19 ml   Filed Weights   06/11/19 1128  Weight: (!) 174.2 kg    Examination: General exam: Awake, laying in bed, in nad Respiratory system: Normal respiratory effort, no wheezing Cardiovascular system: regular rate, s1, s2 Gastrointestinal system: Soft, mildly distended, generally tender Central nervous system: CN2-12 grossly intact, strength intact Extremities: Perfused, no  clubbing Skin: Normal skin turgor, no notable skin lesions seen Psychiatry: Mood normal // no visual hallucinations   Data Reviewed: I have personally reviewed following labs and imaging studies  CBC: Recent Labs  Lab 06/11/19 1243 06/11/19 2020 06/12/19 0209 06/13/19 0321 06/14/19 0121  WBC 15.3* 21.5* 24.4* 26.1* 20.0*  NEUTROABS 13.4*  --   --   --   --   HGB 13.1 12.7 11.6* 10.3* 9.3*  HCT 43.0 41.9 38.6 34.8* 30.7*  MCV 87.2 87.3 87.9 89.2 87.7  PLT 333 308 333 287 948   Basic Metabolic Panel: Recent Labs  Lab 06/11/19 1243 06/11/19 2020 06/12/19 0209 06/13/19 0321 06/14/19 0121  NA 139  --  140 138 135  K 4.4  --  4.2 4.3 4.1  CL 102  --  107 104 102  CO2 25  --  23 20* 18*  GLUCOSE 159*  --  145* 138* 101*  BUN 19  --  19 36* 53*  CREATININE 1.03* 0.98 1.66* 4.37* 5.75*  CALCIUM 8.9  --  8.2* 7.9* 7.4*  PHOS  --   --   --   --  4.8*   GFR: Estimated Creatinine Clearance: 18.2 mL/min (A) (by C-G formula based on SCr of 5.75 mg/dL (H)). Liver Function Tests: Recent Labs  Lab 06/11/19 1243 06/12/19 0209 06/13/19 0321 06/14/19 0121  AST 14* 14* 21  --   ALT 15 12 15   --   ALKPHOS 107 89 94  --   BILITOT 0.8 1.4* 1.1  --   PROT 6.9 5.9* 6.4*  --   ALBUMIN 3.5 2.9* 2.9* 2.5*   Recent Labs  Lab 06/11/19 1243  LIPASE 15   No results for input(s): AMMONIA in the last 168 hours. Coagulation Profile: Recent Labs  Lab 06/13/19 1015  INR 1.3*   Cardiac Enzymes: No results for input(s): CKTOTAL, CKMB, CKMBINDEX, TROPONINI in the last 168 hours. BNP (last 3 results) No results for input(s): PROBNP in the last 8760 hours. HbA1C: No results for input(s): HGBA1C in the last 72 hours. CBG: Recent Labs  Lab 06/13/19 2006 06/14/19 0002 06/14/19 0414 06/14/19 0819 06/14/19 1244  GLUCAP 111* 109* 103* 98 91   Lipid Profile: No results for input(s): CHOL, HDL, LDLCALC, TRIG, CHOLHDL, LDLDIRECT in the last 72 hours. Thyroid Function Tests: No  results for input(s): TSH, T4TOTAL, FREET4, T3FREE, THYROIDAB in the last 72 hours. Anemia Panel: No results for input(s): VITAMINB12, FOLATE, FERRITIN, TIBC, IRON, RETICCTPCT in the last 72 hours. Sepsis Labs: No results for input(s): PROCALCITON, LATICACIDVEN in the last 168 hours.  Recent Results (from the past 240 hour(s))  SARS Coronavirus 2 by RT PCR (hospital order, performed in [facility] hospital lab) Nasopharyngeal Nasopharyngeal Swab     Status: None   Collection Time: 06/11/19  6:16 PM   Specimen: Nasopharyngeal Swab  Result Value Ref Range Status   SARS Coronavirus 2 NEGATIVE NEGATIVE Final    Comment: (NOTE) SARS-CoV-2 target nucleic acids are NOT DETECTED. The SARS-CoV-2 RNA is generally detectable in upper and lower respiratory specimens during the acute phase of infection. The lowest concentration of SARS-CoV-2 viral copies this assay can detect is 250 copies / mL. A negative result does not preclude SARS-CoV-2 infection and should not be used as the sole basis for treatment or other patient management decisions.  A negative result may occur with improper specimen collection / handling, submission of specimen other than nasopharyngeal swab, presence of viral mutation(s) within the areas targeted by this assay, and inadequate number of viral copies (<250 copies / mL). A negative result must be combined with clinical observations, patient history, and epidemiological information. Fact Sheet for Patients:   StrictlyIdeas.no Fact Sheet for Healthcare Providers: BankingDealers.co.za This test is not yet approved or cleared  by the [place] FDA and has been authorized for detection and/or diagnosis of SARS-CoV-2 by FDA under an Emergency Use Authorization (EUA).  This EUA will remain in effect (meaning this test can be used) for the duration of the COVID-19 declaration under Section 564(b)(1) of the Act, 21  U.S.C. section 360bbb-3(b)(1), unless the authorization is terminated or revoked sooner. Performed at [facility], [place] [address], [place], [place] [address]   Culture, blood (Routine X 2) w Reflex to ID Panel     Status: None (Preliminary result)   Collection Time: 06/11/19  8:20 PM   Specimen: BLOOD  Result Value Ref Range Status   Specimen Description BLOOD BLOOD LEFT FOREARM  Final   Special Requests   Final    BOTTLES DRAWN AEROBIC AND ANAEROBIC Blood Culture results may not be optimal due to an inadequate  volume of blood received in culture bottles   Culture   Final    NO GROWTH 3 DAYS Performed at [facility], [place] [address] [place], [place] [address]    Report Status PENDING  Incomplete  Culture, blood (Routine X 2) w Reflex to ID Panel     Status: None (Preliminary result)   Collection Time: 06/11/19 10:00 PM   Specimen: BLOOD  Result Value Ref Range Status   Specimen Description BLOOD RIGHT HAND  Final   Special Requests   Final    BOTTLES DRAWN AEROBIC ONLY Blood Culture results may not be optimal due to an inadequate volume of blood received in culture bottles   Culture   Final    NO GROWTH 3 DAYS Performed at [facility], [place] [address] [place], [place] [address]    Report Status PENDING  Incomplete     Radiology Studies: CT ABDOMEN PELVIS WO CONTRAST  Result Date: 06/12/2019 CLINICAL DATA:  Worsening abdominal pain, elevated white blood cell count, history of acute sigmoid diverticulitis EXAM: CT ABDOMEN AND PELVIS WITHOUT CONTRAST TECHNIQUE: Multidetector CT imaging of the abdomen and pelvis was performed following the standard protocol without IV contrast. COMPARISON:  06/11/2019 FINDINGS: Lower chest: No acute pleural or parenchymal lung disease. Hepatobiliary: No focal liver abnormality is seen. No gallstones, gallbladder wall thickening, or biliary dilatation. Respiratory motion limits evaluation. Pancreas: Unremarkable. No pancreatic ductal  dilatation or surrounding inflammatory changes. Respiratory motion limits evaluation. Spleen: Normal in size without focal abnormality. Adrenals/Urinary Tract: Adrenal glands are unremarkable. Kidneys are normal, without renal calculi, focal lesion, or hydronephrosis. Bladder is unremarkable. Stomach/Bowel: There is continued wall thickening of the mid sigmoid colon, with significant surrounding mesenteric inflammatory change. Punctate foci gas are seen adjacent to the inflamed sigmoid colon. There is increasing fluid in the right lower quadrant with superimposed gas fluid level compatible with perforation. No organized fluid collection, rim enhancement, or abscess. Vascular/Lymphatic: No significant vascular findings are present. No enlarged abdominal or pelvic lymph nodes. Reproductive: Uterus and bilateral adnexa are unremarkable. Other: Free fluid in the pelvis with gas fluid level consistent with perforation. No organized fluid collection or abscess. No abdominal wall hernia. Musculoskeletal: No acute or destructive bony lesions. Reconstructed images demonstrate no additional findings. IMPRESSION: 1. Progression of acute sigmoid diverticulitis, now with evidence of perforation. Increasing fluid in the right lower quadrant with gas fluid level. No rim enhancement to suggest organized fluid collection or abscess at this time. These results were called by telephone at the time of interpretation on 06/12/2019 at 3:31 pm to provider [facility] , who verbally acknowledged these results. Electronically Signed   By: Randa Ngo M.D.   On: 06/12/2019 15:41   CT IMAGE GUIDED DRAINAGE BY PERCUTANEOUS CATHETER  Result Date: 06/13/2019 CLINICAL DATA:  Worsening diverticular abscess. EXAM: CT GUIDED DRAINAGE OF PELVIC ABSCESS ANESTHESIA/SEDATION: Intravenous Fentanyl 51mcg and Versed 1mg  were administered as conscious sedation during continuous monitoring of the patient's level of consciousness and physiological /  cardiorespiratory status by the radiology RN, with a total moderate sedation time of 28 minutes. PROCEDURE: The procedure, risks, benefits, and alternatives were explained to the patient. Questions regarding the procedure were encouraged and answered. The patient understands and consents to the procedure. Select axial scans through the pelvis were obtained. The right pelvic fluid collection was localized and an appropriate skin entry site was determined and marked. The operative field was prepped with chlorhexidinein a sterile fashion, and a sterile drape  was applied covering the operative field. A sterile gown and sterile gloves were used for the procedure. Local anesthesia was provided with 1% Lidocaine. Under CT fluoroscopic and helical CT guidance, 18 gauge trocar needle advanced into the collection. Thin cloudy fluid could be aspirated. Amplatz guidewire advanced easily within the collection, position confirmed on CT. Tract dilated to facilitate placement of a 12 French pigtail drain catheter, formed centrally within the collection. Position confirmed on CT. 22 mL cloudy yellow aspirate sent for Gram stain and culture. Catheter secured externally with 0 Prolene suture and StatLock and placed to gravity drain bag. The patient tolerated the procedure well. COMPLICATIONS: None immediate FINDINGS: Right pelvic gas and fluid collection was localized corresponding to see previous CT findings. 12 French pigtail drain catheter placed as above. 22 mL cloudy yellow aspirate sent for Gram stain and culture. IMPRESSION: Technically successful CT-guided pelvic abscess drain catheter placement . Electronically Signed   By: Lucrezia Europe M.D.   On: 06/13/2019 16:18    Scheduled Meds: . buPROPion  450 mg Oral Daily  . Chlorhexidine Gluconate Cloth  6 each Topical Daily  . citalopram  40 mg Oral Daily  . enoxaparin (LOVENOX) injection  30 mg Subcutaneous Q24H  . insulin aspart  0-9 Units Subcutaneous Q4H  . metoprolol  tartrate  2.5 mg Intravenous Q6H  . pantoprazole (PROTONIX) IV  40 mg Intravenous Q24H  . sodium chloride flush  5 mL Intracatheter Q8H   Continuous Infusions: . sodium chloride 125 mL/hr at 06/14/19 0911  . cefTRIAXone (ROCEPHIN)  IV    . metronidazole    . sodium chloride Stopped (06/12/19 0949)     LOS: 3 days   Marylu Lund, MD [facility] Pager On Amion  If 7PM-7AM, please contact night-coverage 06/14/2019, 1:50 PM

## 2019-06-14 NOTE — Progress Notes (Signed)
Patient ID: [name], female   DOB: Aug 19, 1960, 59 y.o.   MRN: [id number]         [facility] Infectious [facility]  Date of Admission:  06/11/2019          Day 4 piperacillin tazobactam ASSESSMENT: She has sigmoid diverticulitis complicated by perforation, abscess formation and acute kidney injury.  There is some growing evidence that piperacillin tazobactam can be a contributing nephrotoxin.  I will change to ceftriaxone and metronidazole.  PLAN: 1. Change antibiotic therapy to IV ceftriaxone and metronidazole  Principal Problem:   Acute diverticulitis Active Problems:   Acute kidney injury (AKI) with acute tubular necrosis (ATN) ([facility])   Obesity (BMI 30-39.9)   ILD (interstitial lung disease) ([place])   Hypertension   Scheduled Meds: . buPROPion  450 mg Oral Daily  . Chlorhexidine Gluconate Cloth  6 each Topical Daily  . citalopram  40 mg Oral Daily  . enoxaparin (LOVENOX) injection  40 mg Subcutaneous Q24H  . insulin aspart  0-9 Units Subcutaneous Q4H  . metoprolol tartrate  2.5 mg Intravenous Q6H  . pantoprazole (PROTONIX) IV  40 mg Intravenous Q24H  . sodium chloride flush  5 mL Intracatheter Q8H   Continuous Infusions: . sodium chloride 125 mL/hr at 06/14/19 0911  . piperacillin-tazobactam (ZOSYN)  IV 3.375 g (06/14/19 0912)  . sodium chloride Stopped (06/12/19 0949)   PRN Meds:.acetaminophen **OR** acetaminophen, docusate sodium, gabapentin, HYDROmorphone (DILAUDID) injection, LORazepam, ondansetron **OR** ondansetron (ZOFRAN) IV   SUBJECTIVE: She states that she is feeling overwhelmed but, grudgingly, admits that she is probably feeling a little bit better.  Review of Systems: Review of Systems  Unable to perform ROS: Mental acuity    Allergies  Allergen Reactions  . Guaifenesin Nausea And Vomiting and Other (See Comments)    un  . Mucinex [Guaifenesin Er] Nausea And Vomiting  . Sulfa Antibiotics Nausea And Vomiting    OBJECTIVE: Vitals:    06/14/19 0203 06/14/19 0418 06/14/19 0555 06/14/19 0951  BP: 100/68 101/72 100/65 106/61  Pulse: 95 96 95 91  Resp: 17 18 18 18   Temp: 98.6 F (37 C) 98.8 F (37.1 C) 98.7 F (37.1 C) 98.9 F (37.2 C)  TempSrc: Oral Oral Oral Oral  SpO2: 93% 95% 92% 91%  Weight:      Height:       Body mass index is 58.39 kg/m.  Physical Exam Constitutional:      Comments: She is slightly groggy and appears confused.  She asked me four times what department I was with.  Abdominal:     General: There is no distension.     Palpations: Abdomen is soft.     Tenderness: There is no abdominal tenderness.     Comments: Liquid brown stool in right lower quadrant drain bag.  180 cc out yesterday.     Lab Results Lab Results  Component Value Date   WBC 20.0 (H) 06/14/2019   HGB 9.3 (L) 06/14/2019   HCT 30.7 (L) 06/14/2019   MCV 87.7 06/14/2019   PLT 277 06/14/2019    Lab Results  Component Value Date   CREATININE 5.75 (H) 06/14/2019   BUN 53 (H) 06/14/2019   NA 135 06/14/2019   K 4.1 06/14/2019   CL 102 06/14/2019   CO2 18 (L) 06/14/2019    Lab Results  Component Value Date   ALT 15 06/13/2019   AST 21 06/13/2019   ALKPHOS 94 06/13/2019   BILITOT 1.1 06/13/2019  Microbiology: Recent Results (from the past 240 hour(s))  SARS Coronavirus 2 by RT PCR (hospital order, performed in [facility] hospital lab) Nasopharyngeal Nasopharyngeal Swab     Status: None   Collection Time: 06/11/19  6:16 PM   Specimen: Nasopharyngeal Swab  Result Value Ref Range Status   SARS Coronavirus 2 NEGATIVE NEGATIVE Final    Comment: (NOTE) SARS-CoV-2 target nucleic acids are NOT DETECTED. The SARS-CoV-2 RNA is generally detectable in upper and lower respiratory specimens during the acute phase of infection. The lowest concentration of SARS-CoV-2 viral copies this assay can detect is 250 copies / mL. A negative result does not preclude SARS-CoV-2 infection and should not be used as the sole basis for  treatment or other patient management decisions.  A negative result may occur with improper specimen collection / handling, submission of specimen other than nasopharyngeal swab, presence of viral mutation(s) within the areas targeted by this assay, and inadequate number of viral copies (<250 copies / mL). A negative result must be combined with clinical observations, patient history, and epidemiological information. Fact Sheet for Patients:   StrictlyIdeas.no Fact Sheet for Healthcare Providers: BankingDealers.co.za This test is not yet approved or cleared  by the [place] FDA and has been authorized for detection and/or diagnosis of SARS-CoV-2 by FDA under an Emergency Use Authorization (EUA).  This EUA will remain in effect (meaning this test can be used) for the duration of the COVID-19 declaration under Section 564(b)(1) of the Act, 21 U.S.C. section 360bbb-3(b)(1), unless the authorization is terminated or revoked sooner. Performed at [facility], [place] [address], [place], [place] [address]   Culture, blood (Routine X 2) w Reflex to ID Panel     Status: None (Preliminary result)   Collection Time: 06/11/19  8:20 PM   Specimen: BLOOD  Result Value Ref Range Status   Specimen Description BLOOD BLOOD LEFT FOREARM  Final   Special Requests   Final    BOTTLES DRAWN AEROBIC AND ANAEROBIC Blood Culture results may not be optimal due to an inadequate volume of blood received in culture bottles   Culture   Final    NO GROWTH 3 DAYS Performed at [facility], [place] [address] [place], [place] [address]    Report Status PENDING  Incomplete  Culture, blood (Routine X 2) w Reflex to ID Panel     Status: None (Preliminary result)   Collection Time: 06/11/19 10:00 PM   Specimen: BLOOD  Result Value Ref Range Status   Specimen Description BLOOD RIGHT HAND  Final   Special Requests   Final    BOTTLES DRAWN AEROBIC ONLY Blood  Culture results may not be optimal due to an inadequate volume of blood received in culture bottles   Culture   Final    NO GROWTH 3 DAYS Performed at [facility], [place] [address], [place],  [address]    Report Status PENDING  Incomplete    [name], MD [facility] [place] [facility] [phone] pager   [phone] cell 06/14/2019, 1:10 PM

## 2019-06-14 NOTE — Progress Notes (Signed)
Referring Physician(s): Meuth, Blaine Hamper (CCS)  Supervising Physician: Arne Cleveland  Patient Status:  [facility] - In-pt  Chief Complaint: "Bell pain"  Subjective:  History of perforated diverticulitis with associated abscess s/p RLQ drain placement in IR 06/13/2019 by Dr. Vernard Gambles. Patient awake and alert laying in bed. Complains of abdominal pain, stable at this time. Has questions regarding drain management and discharge, answered all questions to satisfaction. RLQ drain site c/d/i.   Allergies: Guaifenesin, Mucinex [guaifenesin er], and Sulfa antibiotics  Medications: Prior to Admission medications   Medication Sig Start Date End Date Taking? Authorizing Provider  ALPRAZolam Duanne Moron) 1 MG tablet Take 1 tablet (1 mg total) by mouth 3 (three) times daily as needed for anxiety. 08/22/13  Yes Blanchie Serve, MD  buPROPion (WELLBUTRIN XL) 150 MG 24 hr tablet Take 450 mg by mouth daily.   Yes [provider]  carvedilol (COREG) 12.5 MG tablet TAKE 1 TABLET BY MOUTH TWICE A DAY Patient taking differently: Take 12.5 mg by mouth 2 (two) times daily with a meal.  02/26/19  Yes Josue Hector, MD  citalopram (CELEXA) 40 MG tablet Take 40 mg by mouth daily.   Yes [provider]  clobetasol cream (TEMOVATE) 4.69 % Apply 1 application topically 2 (two) times daily as needed (paronychia).  04/08/19  Yes [provider]  ENTRESTO 49-51 MG TAKE 1 TABLET BY MOUTH TWICE A DAY Patient taking differently: Take 1 tablet by mouth 2 (two) times daily.  02/26/19  Yes Josue Hector, MD  esomeprazole (NEXIUM) 20 MG capsule Take 40 mg by mouth daily.    Yes [provider]  folic acid (FOLVITE) 1 MG tablet Take 1 mg by mouth daily.    Yes [provider]  gabapentin (NEURONTIN) 400 MG capsule Take 400 mg by mouth at bedtime as needed (pain).  04/08/19  Yes [provider]  meloxicam (MOBIC) 15 MG tablet Take 15 mg by mouth daily.  04/08/19  Yes [provider]  metFORMIN (GLUCOPHAGE-XR) 500 MG 24 hr tablet Take 500 mg by mouth daily with breakfast.  10/03/17  Yes [provider]  methotrexate (50 MG/ML) 1 g injection Inject into the vein once a week.    Yes [provider]  Multiple Vitamin (MULTIVITAMIN) tablet Take 2 tablets by mouth daily.    Yes [provider]  mycophenolate (CELLCEPT) 500 MG tablet Take 1,500 mg by mouth 2 (two) times daily.  06/05/17  Yes [provider]  polyethylene glycol (MIRALAX / GLYCOLAX) packet Take 17 g by mouth daily as needed for moderate constipation (constipation).    Yes [provider]  dextromethorphan (DELSYM) 30 MG/5ML liquid Take 15 mg by mouth 2 (two) times daily as needed for cough.     [provider]  Fluticasone-Salmeterol (ADVAIR) 100-50 MCG/DOSE AEPB Inhale 1 puff into the lungs 2 (two) times daily as needed (asthma).     [provider]  oxyCODONE (OXY IR/ROXICODONE) 5 MG immediate release tablet Take 5 mg by mouth every 6 (six) hours as needed for pain. 05/28/19   [provider]  phentermine (ADIPEX-P) 37.5 MG tablet Take 37.5 mg by mouth daily. 04/06/18   [provider]  progesterone (PROMETRIUM) 200 MG capsule Take 200 mg by mouth at bedtime. 05/28/19   [provider]     Vital Signs: BP 100/65 (BP Location: Right Wrist)   Pulse 95   Temp 98.7 F (37.1 C) (Oral)   Resp 18  Ht 5\' 8"  (1.727 m)   Wt (!) 384 lb (174.2 kg)   SpO2 92%   BMI 58.39 kg/m   Physical Exam Vitals and nursing note reviewed.  Constitutional:      General: She is not in acute distress.    Appearance: Normal appearance.  Pulmonary:     Effort: Pulmonary effort is normal. No respiratory distress.  Abdominal:     Comments: RLQ drain site with mild tenderness, no erythema, drainage, or active bleeding; approximately 25 cc purulent fluid in gravity bag; drain flushes/aspirates without resistance.  Skin:    General:  Skin is warm and dry.  Neurological:     Mental Status: She is alert and oriented to person, place, and time.     Imaging: CT ABDOMEN PELVIS WO CONTRAST  Result Date: 06/12/2019 CLINICAL DATA:  Worsening abdominal pain, elevated white blood cell count, history of acute sigmoid diverticulitis EXAM: CT ABDOMEN AND PELVIS WITHOUT CONTRAST TECHNIQUE: Multidetector CT imaging of the abdomen and pelvis was performed following the standard protocol without IV contrast. COMPARISON:  06/11/2019 FINDINGS: Lower chest: No acute pleural or parenchymal lung disease. Hepatobiliary: No focal liver abnormality is seen. No gallstones, gallbladder wall thickening, or biliary dilatation. Respiratory motion limits evaluation. Pancreas: Unremarkable. No pancreatic ductal dilatation or surrounding inflammatory changes. Respiratory motion limits evaluation. Spleen: Normal in size without focal abnormality. Adrenals/Urinary Tract: Adrenal glands are unremarkable. Kidneys are normal, without renal calculi, focal lesion, or hydronephrosis. Bladder is unremarkable. Stomach/Bowel: There is continued wall thickening of the mid sigmoid colon, with significant surrounding mesenteric inflammatory change. Punctate foci gas are seen adjacent to the inflamed sigmoid colon. There is increasing fluid in the right lower quadrant with superimposed gas fluid level compatible with perforation. No organized fluid collection, rim enhancement, or abscess. Vascular/Lymphatic: No significant vascular findings are present. No enlarged abdominal or pelvic lymph nodes. Reproductive: Uterus and bilateral adnexa are unremarkable. Other: Free fluid in the pelvis with gas fluid level consistent with perforation. No organized fluid collection or abscess. No abdominal wall hernia. Musculoskeletal: No acute or destructive bony lesions. Reconstructed images demonstrate no additional findings. IMPRESSION: 1. Progression of acute sigmoid diverticulitis, now with  evidence of perforation. Increasing fluid in the right lower quadrant with gas fluid level. No rim enhancement to suggest organized fluid collection or abscess at this time. These results were called by telephone at the time of interpretation on 06/12/2019 at 3:31 pm to provider [facility] , who verbally acknowledged these results. Electronically Signed   By: Randa Ngo M.D.   On: 06/12/2019 15:41   DG Chest 1 View  Result Date: 06/11/2019 CLINICAL DATA:  59 year old female with history of shortness of breath. Abdominal cramping. EXAM: CHEST  1 VIEW COMPARISON:  Chest x-ray 05/23/2019. FINDINGS: Lung volumes are normal. No consolidative airspace disease. No pleural effusions. No pneumothorax. No pulmonary nodule or mass noted. Pulmonary vasculature and the cardiomediastinal silhouette are within normal limits. IMPRESSION: No radiographic evidence of acute cardiopulmonary disease. Electronically Signed   By: Vinnie Langton M.D.   On: 06/11/2019 17:25   CT ABDOMEN PELVIS W CONTRAST  Result Date: 06/11/2019 CLINICAL DATA:  Abdominal pain EXAM: CT ABDOMEN AND PELVIS WITH CONTRAST TECHNIQUE: Multidetector CT imaging of the abdomen and pelvis was performed using the standard protocol following bolus administration of intravenous contrast. CONTRAST:  172mL OMNIPAQUE IOHEXOL 300 MG/ML  SOLN COMPARISON:  2014 FINDINGS: Lower chest: No acute abnormality. Hepatobiliary: Possible hepatic steatosis. Gallbladder is unremarkable Pancreas: Unremarkable. Spleen: Unremarkable. Adrenals/Urinary Tract:  Adrenals, kidneys, and bladder are unremarkable. Stomach/Bowel: Stomach is within normal limits. Bowel is normal in caliber. There are inflammatory changes about the sigmoid colon. Sigmoid diverticulosis is present. Appendix is not definitely visualized. Vascular/Lymphatic: No significant vascular findings are present. No enlarged abdominal or pelvic lymph nodes. Reproductive: Uterus and bilateral adnexa are unremarkable.  Other: Small volume free fluid.  Abdominal wall is unremarkable. Musculoskeletal: Lumbar spine degenerative changes. There is no acute osseous abnormality. IMPRESSION: Acute sigmoid diverticulitis. Small volume free fluid no evidence of abscess. Electronically Signed   By: Macy Mis M.D.   On: 06/11/2019 17:05   CT IMAGE GUIDED DRAINAGE BY PERCUTANEOUS CATHETER  Result Date: 06/13/2019 CLINICAL DATA:  Worsening diverticular abscess. EXAM: CT GUIDED DRAINAGE OF PELVIC ABSCESS ANESTHESIA/SEDATION: Intravenous Fentanyl 34mcg and Versed 1mg  were administered as conscious sedation during continuous monitoring of the patient's level of consciousness and physiological / cardiorespiratory status by the radiology RN, with a total moderate sedation time of 28 minutes. PROCEDURE: The procedure, risks, benefits, and alternatives were explained to the patient. Questions regarding the procedure were encouraged and answered. The patient understands and consents to the procedure. Select axial scans through the pelvis were obtained. The right pelvic fluid collection was localized and an appropriate skin entry site was determined and marked. The operative field was prepped with chlorhexidinein a sterile fashion, and a sterile drape was applied covering the operative field. A sterile gown and sterile gloves were used for the procedure. Local anesthesia was provided with 1% Lidocaine. Under CT fluoroscopic and helical CT guidance, 18 gauge trocar needle advanced into the collection. Thin cloudy fluid could be aspirated. Amplatz guidewire advanced easily within the collection, position confirmed on CT. Tract dilated to facilitate placement of a 12 French pigtail drain catheter, formed centrally within the collection. Position confirmed on CT. 22 mL cloudy yellow aspirate sent for Gram stain and culture. Catheter secured externally with 0 Prolene suture and StatLock and placed to gravity drain bag. The patient tolerated the  procedure well. COMPLICATIONS: None immediate FINDINGS: Right pelvic gas and fluid collection was localized corresponding to see previous CT findings. 12 French pigtail drain catheter placed as above. 22 mL cloudy yellow aspirate sent for Gram stain and culture. IMPRESSION: Technically successful CT-guided pelvic abscess drain catheter placement . Electronically Signed   By: Lucrezia Europe M.D.   On: 06/13/2019 16:18    Labs:  CBC: Recent Labs    06/11/19 2020 06/12/19 0209 06/13/19 0321 06/14/19 0121  WBC 21.5* 24.4* 26.1* 20.0*  HGB 12.7 11.6* 10.3* 9.3*  HCT 41.9 38.6 34.8* 30.7*  PLT 308 333 287 277    COAGS: Recent Labs    06/13/19 1015  INR 1.3*    BMP: Recent Labs    06/11/19 1243 06/11/19 1243 06/11/19 2020 06/12/19 0209 06/13/19 0321 06/14/19 0121  NA 139  --   --  140 138 135  K 4.4  --   --  4.2 4.3 4.1  CL 102  --   --  107 104 102  CO2 25  --   --  23 20* 18*  GLUCOSE 159*  --   --  145* 138* 101*  BUN 19  --   --  19 36* 53*  CALCIUM 8.9  --   --  8.2* 7.9* 7.4*  CREATININE 1.03*   < > 0.98 1.66* 4.37* 5.75*  GFRNONAA 60*   < > >60 34* 10* 7*  GFRAA >60   < > >60 39* 12*  9*   < > = values in this interval not displayed.    LIVER FUNCTION TESTS: Recent Labs    05/30/19 0900 05/30/19 0900 06/11/19 1243 06/12/19 0209 06/13/19 0321 06/14/19 0121  BILITOT 0.7  --  0.8 1.4* 1.1  --   AST 14*  --  14* 14* 21  --   ALT 17  --  15 12 15   --   ALKPHOS 100  --  107 89 94  --   PROT 6.6  --  6.9 5.9* 6.4*  --   ALBUMIN 3.6   < > 3.5 2.9* 2.9* 2.5*   < > = values in this interval not displayed.    Assessment and Plan:  History of perforated diverticulitis with associated abscess s/p RLQ drain placement in IR 06/13/2019 by Dr. Vernard Gambles. RLQ drain stable with approximately 25 cc purulent fluid in gravity bag (additional 180 cc output from drain in past 24 hours per chart). Continue current drain management- continue with Qshift flushes/monitor of  output. Plan for repeat CT/possible drain injection when output <10 cc/24 hours (assess for possible removal). Further plans per [facility]/CCS/nephrology- appreciate and agree with management. IR to follow.   Electronically Signed: Earley Abide, PA-C 06/14/2019, 8:15 AM   I spent a total of 25 Minutes at the the patient's bedside AND on the patient's hospital floor or unit, greater than 50% of which was counseling/coordinating care for diverticular abscess s/p RLQ drain placement.

## 2019-06-14 NOTE — Progress Notes (Signed)
Noted change in patient's status.  While she continues to be alert and awake, MAEW; breathing is more shallow.  Patient stated her abdomen appears more rotund to her.  B/P noted to be 76-89/46-56.  N/S continues to infuse at 125 ml/hr.  1+ edema noted BLE.  Suspect sacral edema present, though unable to accurately confirm d/t morbid obesity.  Foley drained 50 ml thus far this shift of dk amber urine.  Rapid response notified to track for potential decline in status.  Pt to transfer to PCU for closer monitoring d/t change in overall status.

## 2019-06-15 ENCOUNTER — Inpatient Hospital Stay (HOSPITAL_COMMUNITY): Payer: Commercial Managed Care - PPO

## 2019-06-15 LAB — CBC WITH DIFFERENTIAL/PLATELET
Abs Immature Granulocytes: 0.17 10*3/uL — ABNORMAL HIGH (ref 0.00–0.07)
Basophils Absolute: 0 10*3/uL (ref 0.0–0.1)
Basophils Relative: 0 %
Eosinophils Absolute: 0.2 10*3/uL (ref 0.0–0.5)
Eosinophils Relative: 2 %
HCT: 29.4 % — ABNORMAL LOW (ref 36.0–46.0)
Hemoglobin: 8.8 g/dL — ABNORMAL LOW (ref 12.0–15.0)
Immature Granulocytes: 1 %
Lymphocytes Relative: 8 %
Lymphs Abs: 1.2 10*3/uL (ref 0.7–4.0)
MCH: 26.3 pg (ref 26.0–34.0)
MCHC: 29.9 g/dL — ABNORMAL LOW (ref 30.0–36.0)
MCV: 87.8 fL (ref 80.0–100.0)
Monocytes Absolute: 0.8 10*3/uL (ref 0.1–1.0)
Monocytes Relative: 6 %
Neutro Abs: 11.6 10*3/uL — ABNORMAL HIGH (ref 1.7–7.7)
Neutrophils Relative %: 83 %
Platelets: 272 10*3/uL (ref 150–400)
RBC: 3.35 MIL/uL — ABNORMAL LOW (ref 3.87–5.11)
RDW: 15.2 % (ref 11.5–15.5)
WBC: 14 10*3/uL — ABNORMAL HIGH (ref 4.0–10.5)
nRBC: 0 % (ref 0.0–0.2)

## 2019-06-15 LAB — RENAL FUNCTION PANEL
Albumin: 2.3 g/dL — ABNORMAL LOW (ref 3.5–5.0)
Anion gap: 18 — ABNORMAL HIGH (ref 5–15)
BUN: 73 mg/dL — ABNORMAL HIGH (ref 6–20)
CO2: 18 mmol/L — ABNORMAL LOW (ref 22–32)
Calcium: 7.5 mg/dL — ABNORMAL LOW (ref 8.9–10.3)
Chloride: 103 mmol/L (ref 98–111)
Creatinine, Ser: 7.53 mg/dL — ABNORMAL HIGH (ref 0.44–1.00)
GFR calc Af Amer: 6 mL/min — ABNORMAL LOW (ref >60)
GFR calc non Af Amer: 5 mL/min — ABNORMAL LOW (ref >60)
Glucose, Bld: 82 mg/dL (ref 70–99)
Phosphorus: 6.7 mg/dL — ABNORMAL HIGH (ref 2.5–4.6)
Potassium: 4.5 mmol/L (ref 3.5–5.1)
Sodium: 139 mmol/L (ref 135–145)

## 2019-06-15 LAB — URINALYSIS, MICROSCOPIC (REFLEX)

## 2019-06-15 LAB — URINALYSIS, ROUTINE W REFLEX MICROSCOPIC
Glucose, UA: NEGATIVE mg/dL
Ketones, ur: 15 mg/dL — AB
Nitrite: NEGATIVE
Protein, ur: 100 mg/dL — AB
Specific Gravity, Urine: >1.03 — ABNORMAL HIGH (ref 1.005–1.030)
pH: 5 (ref 5.0–8.0)

## 2019-06-15 LAB — GLUCOSE, CAPILLARY
Glucose-Capillary: 112 mg/dL — ABNORMAL HIGH (ref 70–99)
Glucose-Capillary: 113 mg/dL — ABNORMAL HIGH (ref 70–99)
Glucose-Capillary: 73 mg/dL (ref 70–99)
Glucose-Capillary: 79 mg/dL (ref 70–99)
Glucose-Capillary: 82 mg/dL (ref 70–99)
Glucose-Capillary: 84 mg/dL (ref 70–99)

## 2019-06-15 LAB — SODIUM, URINE, RANDOM: Sodium, Ur: 23 mmol/L

## 2019-06-15 LAB — MRSA PCR SCREENING: MRSA by PCR: NEGATIVE

## 2019-06-15 MED ORDER — SODIUM BICARBONATE 650 MG PO TABS
650.0000 mg | ORAL_TABLET | Freq: Three times a day (TID) | ORAL | Status: DC
  Administered 2019-06-15 – 2019-06-16 (×6): 650 mg via ORAL
  Filled 2019-06-15 (×6): qty 1

## 2019-06-15 MED ORDER — FUROSEMIDE 10 MG/ML IJ SOLN
100.0000 mg | Freq: Once | INTRAVENOUS | Status: AC
  Administered 2019-06-15: 100 mg via INTRAVENOUS
  Filled 2019-06-15: qty 10

## 2019-06-15 MED ORDER — LABETALOL HCL 5 MG/ML IV SOLN: 5.0000 mg | INTRAVENOUS | Status: DC | PRN

## 2019-06-15 MED ORDER — SODIUM CHLORIDE 0.9 % IV BOLUS
500.0000 mL | Freq: Once | INTRAVENOUS | Status: AC
  Administered 2019-06-15: 500 mL via INTRAVENOUS

## 2019-06-15 NOTE — Progress Notes (Signed)
Patient has not produced any urine throughout shift. Attempted bladder scan 3x, all results say 0 mL. MD on call notified.

## 2019-06-15 NOTE — Progress Notes (Addendum)
[facility] Progress Note     Subjective: CC-   Moved to progressive floor yesterday for worsening renal failure, nephrology following. Creatinine up to 7.53, she is oliguric.  States that her abdominal pain is about the same. Most pain is around the RLQ drain. States that she also feels bloated and mildly nauseated, no emesis. No flatus, no BM since admission. States that she is starving. Drain with 50cc feculent output last 24 hours. WBC down to 14, afebrile.   Objective: Vital signs in last 24 hours: Temp:  [97.6 F (36.4 C)-98.9 F (37.2 C)] 97.6 F (36.4 C) (06/06 0800) Pulse Rate:  [77-91] 81 (06/06 0800) Resp:  [13-18] 13 (06/06 0800) BP: (89-119)/(46-69) 119/69 (06/06 0800) SpO2:  [91 %-96 %] 93 % (06/06 0800) Last BM Date: 06/08/19  Intake/Output from previous day: 06/05 0701 - 06/06 0700 In: 1436.3 [P.O.:480; I.V.:651.3; IV Piggyback:300] Out: 125 [Urine:75; Drains:50] Intake/Output this shift: Total I/O In: -  Out: 60 [Drains:60]  PE: Gen:  Alert, NAD HEENT: face is flushed Card:  RRR Pulm:  rate and effort normal Abd: obese, soft, ?mild distension, few BS heard, RLQ drain with feculent fluid in bag, TTP around drain otherwise nontender  Lab Results:  Recent Labs    06/14/19 0121 06/15/19 0710  WBC 20.0* 14.0*  HGB 9.3* 8.8*  HCT 30.7* 29.4*  PLT 277 272   BMET Recent Labs    06/14/19 0121 06/15/19 0710  NA 135 139  K 4.1 4.5  CL 102 103  CO2 18* 18*  GLUCOSE 101* 82  BUN 53* 73*  CREATININE 5.75* 7.53*  CALCIUM 7.4* 7.5*   PT/INR Recent Labs    06/13/19 1015  LABPROT 15.8*  INR 1.3*   CMP     Component Value Date/Time   NA 139 06/15/2019 0710   NA 139 12/24/2017 1641   K 4.5 06/15/2019 0710   CL 103 06/15/2019 0710   CO2 18 (L) 06/15/2019 0710   GLUCOSE 82 06/15/2019 0710   BUN 73 (H) 06/15/2019 0710   BUN 15 12/24/2017 1641   CREATININE 7.53 (H) 06/15/2019 0710   CALCIUM 7.5 (L) 06/15/2019 0710   PROT 6.4 (L)  06/13/2019 0321   ALBUMIN 2.3 (L) 06/15/2019 0710   AST 21 06/13/2019 0321   ALT 15 06/13/2019 0321   ALKPHOS 94 06/13/2019 0321   BILITOT 1.1 06/13/2019 0321   GFRNONAA 5 (L) 06/15/2019 0710   GFRAA 6 (L) 06/15/2019 0710   Lipase     Component Value Date/Time   LIPASE 15 06/11/2019 1243       Studies/Results: CT IMAGE GUIDED DRAINAGE BY PERCUTANEOUS CATHETER  Result Date: 06/13/2019 CLINICAL DATA:  Worsening diverticular abscess. EXAM: CT GUIDED DRAINAGE OF PELVIC ABSCESS ANESTHESIA/SEDATION: Intravenous Fentanyl 67mcg and Versed 1mg  were administered as conscious sedation during continuous monitoring of the patient's level of consciousness and physiological / cardiorespiratory status by the radiology RN, with a total moderate sedation time of 28 minutes. PROCEDURE: The procedure, risks, benefits, and alternatives were explained to the patient. Questions regarding the procedure were encouraged and answered. The patient understands and consents to the procedure. Select axial scans through the pelvis were obtained. The right pelvic fluid collection was localized and an appropriate skin entry site was determined and marked. The operative field was prepped with chlorhexidinein a sterile fashion, and a sterile drape was applied covering the operative field. A sterile gown and sterile gloves were used for the procedure. Local anesthesia was provided with 1%  Lidocaine. Under CT fluoroscopic and helical CT guidance, 18 gauge trocar needle advanced into the collection. Thin cloudy fluid could be aspirated. Amplatz guidewire advanced easily within the collection, position confirmed on CT. Tract dilated to facilitate placement of a 12 French pigtail drain catheter, formed centrally within the collection. Position confirmed on CT. 22 mL cloudy yellow aspirate sent for Gram stain and culture. Catheter secured externally with 0 Prolene suture and StatLock and placed to gravity drain bag. The patient tolerated  the procedure well. COMPLICATIONS: None immediate FINDINGS: Right pelvic gas and fluid collection was localized corresponding to see previous CT findings. 12 French pigtail drain catheter placed as above. 22 mL cloudy yellow aspirate sent for Gram stain and culture. IMPRESSION: Technically successful CT-guided pelvic abscess drain catheter placement . Electronically Signed   By: Lucrezia Europe M.D.   On: 06/13/2019 16:18    Anti-infectives: Anti-infectives (From admission, onward)   Start     Dose/Rate Route Frequency Ordered Stop   06/14/19 1330  cefTRIAXone (ROCEPHIN) 2 g in sodium chloride 0.9 % 100 mL IVPB     2 g 200 mL/hr over 30 Minutes Intravenous Every 24 hours 06/14/19 1316     06/14/19 1330  metroNIDAZOLE (FLAGYL) IVPB 500 mg     500 mg 100 mL/hr over 60 Minutes Intravenous Every 8 hours 06/14/19 1316     06/13/19 2200  piperacillin-tazobactam (ZOSYN) IVPB 3.375 g  Status:  Discontinued     3.375 g 12.5 mL/hr over 240 Minutes Intravenous Every 12 hours 06/13/19 1111 06/14/19 1316   06/11/19 2330  piperacillin-tazobactam (ZOSYN) IVPB 3.375 g  Status:  Discontinued     3.375 g 12.5 mL/hr over 240 Minutes Intravenous Every 8 hours 06/11/19 1852 06/13/19 1111   06/11/19 1715  piperacillin-tazobactam (ZOSYN) IVPB 3.375 g     3.375 g 100 mL/hr over 30 Minutes Intravenous  Once 06/11/19 1714 06/11/19 1942       Assessment/Plan 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 Nephrology following Urinary Retention - Foley placed 6/4  Acute sigmoid diverticulitis with perforation and abscess S/p IR drain 6/4, culture pending  - 2nd bout,history of diverticulitis in 2014  - last colonoscopy >10 years ago  - CT scan 6/3 showed progression of acute sigmoid diverticulitis now with evidence of perforation, increasing fluid in the right lower quadrant with gas fluid level, no rim  enhancement to suggest organized fluid collection or abscess at this time  ID - zosyn 6/2>>6/5, rocephin/flagyl 6/5>> FEN - IVF, sips of clears VTE - SCDs, lovenox Foley - in place Follow up - TBD  Plan: WBC continues to downtrend. Continue drain, IV antibiotics (switched to rocephin/flagyl due to worsening renal failure).    Needs to get OOB today.   [place] for sips of clears.   LOS: 4 days    [facility] 06/15/2019, 8:42 AM Please see Amion for pager number during day hours 7:00am-4:30pm  Agree with above. Needs to get OOB and ambulate. I think she looks a little better from her abdominal standpoint.  Will start clear liquids.  Alphonsa Overall, MD, [facility] Office phone:  [phone]

## 2019-06-15 NOTE — Progress Notes (Signed)
Patient educated significantly about the importance of getting oob but adamantly refusing. Patient states she will try to get up after lunch.   Hiram Comber, RN 06/15/2019 10:57 AM

## 2019-06-15 NOTE — Progress Notes (Signed)
PROGRESS NOTE    [name]  YYT:[id number] DOB: Jan 12, 1960 DOA: 06/11/2019 PCP: Christain Sacramento, MD    Brief Narrative:  59 y.o. 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 Pt reportedly underwent D/C procedure on 5/25. Very shortly after the procedure, pt recalled abdominal "swelling" with subsequent abd pain. Pt believed herself to be constipated, thus was prescribed miralax. Pt had several BM without resolution of symptoms. Symptoms progressed and worsened, prompting visit to ED  ED Course: In the ED, pt was found to have WBC of 15k with HR of 110's with RR in the 20's. CT abd/pelvis was performed, which was notable for sigmoid diverticulitis. Pt was started on zosyn. Hospitalist consulted for consideration for admission.  Assessment & Plan:   Principal Problem:   Acute diverticulitis Active Problems:   Obesity (BMI 30-39.9)   ILD (interstitial lung disease) ([place])   Hypertension   Acute kidney injury (AKI) with acute tubular necrosis (ATN) ([place])  1. Acute sigmoid diverticulitis with sepsis present on admit 1. Pt with leukocytosis, tachycardia, tachypnea 2. Initial CT abd personally reviewed. Findings of acute sigmoid diverticulitis without perforation 3. Given immunosuppressed state, have asked ID to follow 4. Given worsening symptoms, repeat CT obtained and reviewed with Radiologist. Findings worrisome for new perforation without abscess collection 5. Consulted General Surgery who recommended IR consult for drain placement.  6. Drain placed on 6/4 with purulent material expressed 7. WBC continues to improve, down to 14 today 8. Was initially on zosyn, however now changed to rocephin with flagyl given below renal failure 9. Cont  current abx. General Surgery following 2. DM2 1. Recent a1c of 7.0 2. Holding oral hyperglycemic regimen while in hospital 3. Continue on SSI coverage as needed 4. Glucose trends thus far stable 3. Morbid obesity 1. Recommend diet/lifestyle modification 4. HTN 1. Stopped entresto 2. Had been on scheduled low dose IV beta blocker. 3. Given periods of low BP, will transition to PRN labetalol only and d/c scheduled beta blocker for now 5. 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 Had been on cellcept and methotrexate. Given acute infection, will continue to hold 2. Seems to be stable at present 3. No evidence of hemoptysis 6. Hx microscopic polyangiitis 1. Followed by [facility]um at [facility] 2. Historically required high dosed steroids, rituximab, plasmapheresis in the past and ultimately is continued on cellcept and methotrexate 3. Given acute infection, continue to hold immunomodulators at this time 7. Acute renal failure, oliguiric 1. Suspected ATN 2. No signs of uremia, electrolytes unremarkable 3. UA on presentation without blood or protein 4. Consulted Nephrology for assistance, appreciate input 5. Continued on gentle hydration 6. Cr up to over 7 today with minimal urine output 7. Clinical signs of volume overload. CXR ordered and reviewed, findings suggestive of pulm edema noted 8. Per nephrology, trial of high dose IV lasix noted 9. Per Nephrology, possibility of temporary HD if needed until return of renal function. Pt is open to HD if needed  DVT prophylaxis: Lovenox subq Code Status: Full Family Communication: Pt in room, family not at bedside  Status is: Inpatient  Remains inpatient appropriate because:Persistent severe electrolyte disturbances, Unsafe d/c plan, IV treatments appropriate due to intensity of illness or inability to take PO and Inpatient level of care appropriate due to  severity of illness   Dispo: The patient is from: Home              Anticipated d/c is  to: Home              Anticipated d/c date is: > 3 days              Patient currently is not medically stable to d/c.   Consultants:   ID  General Surgery  IR  Nephrology  Procedures:   IR guided drain placement 6/4  Antimicrobials: Anti-infectives (From admission, onward)   Start     Dose/Rate Route Frequency Ordered Stop   06/14/19 1330  cefTRIAXone (ROCEPHIN) 2 g in sodium chloride 0.9 % 100 mL IVPB     2 g 200 mL/hr over 30 Minutes Intravenous Every 24 hours 06/14/19 1316     06/14/19 1330  metroNIDAZOLE (FLAGYL) IVPB 500 mg     500 mg 100 mL/hr over 60 Minutes Intravenous Every 8 hours 06/14/19 1316     06/13/19 2200  piperacillin-tazobactam (ZOSYN) IVPB 3.375 g  Status:  Discontinued     3.375 g 12.5 mL/hr over 240 Minutes Intravenous Every 12 hours 06/13/19 1111 06/14/19 1316   06/11/19 2330  piperacillin-tazobactam (ZOSYN) IVPB 3.375 g  Status:  Discontinued     3.375 g 12.5 mL/hr over 240 Minutes Intravenous Every 8 hours 06/11/19 1852 06/13/19 1111   06/11/19 1715  piperacillin-tazobactam (ZOSYN) IVPB 3.375 g     3.375 g 100 mL/hr over 30 Minutes Intravenous  Once 06/11/19 1714 06/11/19 1942      Subjective: Feeling more swollen.   Objective: Vitals:   06/14/19 1845 06/14/19 2029 06/15/19 0002 06/15/19 0800  BP: (!) 89/51 93/67 109/67 119/69  Pulse: 78 80 82 81  Resp: 18 18 16 13   Temp:  98 F (36.7 C) 97.7 F (36.5 C) 97.6 F (36.4 C)  TempSrc:  Oral Oral Oral  SpO2: 96% 93% 94% 93%  Weight:      Height:        Intake/Output Summary (Last 24 hours) at 06/15/2019 1536 Last data filed at 06/15/2019 1535 Gross per 24 hour  Intake 1614 ml  Output 230 ml  Net 1384 ml   Filed Weights   06/11/19 1128  Weight: (!) 174.2 kg    Examination: General exam: Conversant, in no acute distress Respiratory system: normal chest rise, clear, no audible wheezing Cardiovascular system: regular rhythm, s1-s2 Gastrointestinal system: Nondistended, nontender,  pos BS Central nervous system: No seizures, no tremors Extremities: No cyanosis, no joint deformities, generalized edema Skin: No rashes, no pallor Psychiatry: Affect normal // no auditory hallucinations   Data Reviewed: I have personally reviewed following labs and imaging studies  CBC: Recent Labs  Lab 06/11/19 1243 06/11/19 1243 06/11/19 2020 06/12/19 0209 06/13/19 0321 06/14/19 0121 06/15/19 0710  WBC 15.3*   < > 21.5* 24.4* 26.1* 20.0* 14.0*  NEUTROABS 13.4*  --   --   --   --   --  11.6*  HGB 13.1   < > 12.7 11.6* 10.3* 9.3* 8.8*  HCT 43.0   < > 41.9 38.6 34.8* 30.7* 29.4*  MCV 87.2   < > 87.3 87.9 89.2 87.7 87.8  PLT 333   < > 308 333 287 277 272   < > = values in this interval not displayed.   Basic Metabolic Panel: Recent Labs  Lab 06/11/19 1243 06/11/19 1243 06/11/19 2020 06/12/19 6237 06/13/19 0321 06/14/19 0121  06/15/19 0710  NA 139  --   --  140 138 135 139  K 4.4  --   --  4.2 4.3 4.1 4.5  CL 102  --   --  107 104 102 103  CO2 25  --   --  23 20* 18* 18*  GLUCOSE 159*  --   --  145* 138* 101* 82  BUN 19  --   --  19 36* 53* 73*  CREATININE 1.03*   < > 0.98 1.66* 4.37* 5.75* 7.53*  CALCIUM 8.9  --   --  8.2* 7.9* 7.4* 7.5*  PHOS  --   --   --   --   --  4.8* 6.7*   < > = values in this interval not displayed.   GFR: Estimated Creatinine Clearance: 13.9 mL/min (A) (by C-G formula based on SCr of 7.53 mg/dL (H)). Liver Function Tests: Recent Labs  Lab 06/11/19 1243 06/12/19 0209 06/13/19 0321 06/14/19 0121 06/15/19 0710  AST 14* 14* 21  --   --   ALT 15 12 15   --   --   ALKPHOS 107 89 94  --   --   BILITOT 0.8 1.4* 1.1  --   --   PROT 6.9 5.9* 6.4*  --   --   ALBUMIN 3.5 2.9* 2.9* 2.5* 2.3*   Recent Labs  Lab 06/11/19 1243  LIPASE 15   No results for input(s): AMMONIA in the last 168 hours. Coagulation Profile: Recent Labs  Lab 06/13/19 1015  INR 1.3*   Cardiac Enzymes: No results for input(s): CKTOTAL, CKMB, CKMBINDEX, TROPONINI  in the last 168 hours. BNP (last 3 results) No results for input(s): PROBNP in the last 8760 hours. HbA1C: No results for input(s): HGBA1C in the last 72 hours. CBG: Recent Labs  Lab 06/14/19 2117 06/15/19 0012 06/15/19 0442 06/15/19 0824 06/15/19 1202  GLUCAP 73 84 79 73 82   Lipid Profile: No results for input(s): CHOL, HDL, LDLCALC, TRIG, CHOLHDL, LDLDIRECT in the last 72 hours. Thyroid Function Tests: No results for input(s): TSH, T4TOTAL, FREET4, T3FREE, THYROIDAB in the last 72 hours. Anemia Panel: No results for input(s): VITAMINB12, FOLATE, FERRITIN, TIBC, IRON, RETICCTPCT in the last 72 hours. Sepsis Labs: No results for input(s): PROCALCITON, LATICACIDVEN in the last 168 hours.  Recent Results (from the past 240 hour(s))  SARS Coronavirus 2 by RT PCR (hospital order, performed in [facility] hospital lab) Nasopharyngeal Nasopharyngeal Swab     Status: None   Collection Time: 06/11/19  6:16 PM   Specimen: Nasopharyngeal Swab  Result Value Ref Range Status   SARS Coronavirus 2 NEGATIVE NEGATIVE Final    Comment: (NOTE) SARS-CoV-2 target nucleic acids are NOT DETECTED. The SARS-CoV-2 RNA is generally detectable in upper and lower respiratory specimens during the acute phase of infection. The lowest concentration of SARS-CoV-2 viral copies this assay can detect is 250 copies / mL. A negative result does not preclude SARS-CoV-2 infection and should not be used as the sole basis for treatment or other patient management decisions.  A negative result may occur with improper specimen collection / handling, submission of specimen other than nasopharyngeal swab, presence of viral mutation(s) within the areas targeted by this assay, and inadequate number of viral copies (<250 copies / mL). A negative result must be combined with clinical observations, patient history, and epidemiological information. Fact Sheet for Patients:   StrictlyIdeas.no Fact  Sheet for Healthcare Providers: BankingDealers.co.za This test is not yet  approved or cleared  by the [place] and has been authorized for detection and/or diagnosis of SARS-CoV-2 by FDA under an Emergency Use Authorization (EUA).  This EUA will remain in effect (meaning this test can be used) for the duration of the COVID-19 declaration under Section 564(b)(1) of the Act, 21 U.S.C. section 360bbb-3(b)(1), unless the authorization is terminated or revoked sooner. Performed at [facility], [place] [address], [place], [place] [address]   Culture, blood (Routine X 2) w Reflex to ID Panel     Status: None (Preliminary result)   Collection Time: 06/11/19  8:20 PM   Specimen: BLOOD  Result Value Ref Range Status   Specimen Description BLOOD BLOOD LEFT FOREARM  Final   Special Requests   Final    BOTTLES DRAWN AEROBIC AND ANAEROBIC Blood Culture results may not be optimal due to an inadequate volume of blood received in culture bottles   Culture   Final    NO GROWTH 4 DAYS Performed at [facility], [place] [address] [place], [place] [address]    Report Status PENDING  Incomplete  Culture, blood (Routine X 2) w Reflex to ID Panel     Status: None (Preliminary result)   Collection Time: 06/11/19 10:00 PM   Specimen: BLOOD  Result Value Ref Range Status   Specimen Description BLOOD RIGHT HAND  Final   Special Requests   Final    BOTTLES DRAWN AEROBIC ONLY Blood Culture results may not be optimal due to an inadequate volume of blood received in culture bottles   Culture   Final    NO GROWTH 4 DAYS Performed at [facility], [place] [address], [place], [place] [address]    Report Status PENDING  Incomplete  MRSA PCR Screening     Status: None   Collection Time: 06/15/19  9:26 AM   Specimen: Nasopharyngeal  Result Value Ref Range Status   MRSA by PCR NEGATIVE NEGATIVE Final    Comment:        The GeneXpert MRSA Assay (FDA approved for NASAL  specimens only), is one component of a comprehensive MRSA colonization surveillance program. It is not intended to diagnose MRSA infection nor to guide or monitor treatment for MRSA infections. Performed at [facility], [place] [address], [place], [place] [address]      Radiology Studies: DG CHEST PORT 1 VIEW  Result Date: 06/15/2019 CLINICAL DATA:  Expiratory wheezes this morning. EXAM: PORTABLE CHEST 1 VIEW COMPARISON:  06/11/2019 FINDINGS: Heart is mildly enlarged. There is mild perihilar infiltrate consistent with mild edema. More focal opacity in the MEDIAL LEFT lung base may represent atelectasis or developing infiltrate. IMPRESSION: 1. Cardiomegaly and mild edema. 2. MEDIAL LEFT lower lobe atelectasis or infiltrate. Electronically Signed   By: Nolon Nations M.D.   On: 06/15/2019 11:21    Scheduled Meds: . buPROPion  450 mg Oral Daily  . Chlorhexidine Gluconate Cloth  6 each Topical Daily  . citalopram  40 mg Oral Daily  . enoxaparin (LOVENOX) injection  30 mg Subcutaneous Q24H  . insulin aspart  0-9 Units Subcutaneous Q4H  . pantoprazole (PROTONIX) IV  40 mg Intravenous Q24H  . sodium bicarbonate  650 mg Oral TID  . sodium chloride flush  5 mL Intracatheter Q8H   Continuous Infusions: . cefTRIAXone (ROCEPHIN)  IV 2 g (06/15/19 1202)  . metronidazole 500 mg (06/15/19 1354)  . sodium chloride Stopped (06/12/19 0949)     LOS:  4 days   Marylu Lund, MD [facility] Pager On Amion  If 7PM-7AM, please contact night-coverage 06/15/2019, 3:36 PM

## 2019-06-15 NOTE — Progress Notes (Addendum)
[facility] NEPHROLOGY PROGRESS NOTE  Assessment/ Plan: Pt is a 59 y.o. 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  #Acute kidney injury likely hemodynamically mediated due to hypotension, entresto, infection and contrast nephropathy.  Now probably progressed to ATN. She was on meloxicam at home, currently on hold.   The urinalysis and serum creatinine level was normal on admission therefore no GN (h/o ANCA vasculitis).  CT scan ruled out obstruction. She initially received Zosyn which is known to cause AIN.  I will repeat urinalysis. She remains anuric and creatinine level continue to elevate probably because of intermittent hypotension causing insult to the kidney. She now has wheezing and concerning for fluid overload.  Discontinue IV fluid and order Lasix 100 mg IV 1 dose.  She is mentating well and has no uremic feature.  I have explained to her that if she does not have urine output with Lasix or associated with any worsening symptoms then she may need dialysis.  She agreed. No urgent indication for dialysis at the moment. She is NPO for GI issue. Avoid hypotensive episode.  #Metabolic acidosis: Starting oral sodium bicarbonate.  #Sepsis due to perforated diverticulitis/intra-abdominal infection: Seen by general surgery thought to be very high risk for surgical intervention.  Status post right lower quadrant drain placed by IR on 6/4.    Antibiotics changed from Zosyn to ceftriaxone and Flagyl.  #History of ANCA vasculitis/microscopic polyangiitis: Reportedly she was treated with steroids, plasmapheresis, rituximab in the past.  She is currently on  CellCept and methotrexate which is on hold because of sepsis and AKI.  She follows with rheumatology.  #Hypertension: Became hypotensive and held [place].  Monitor blood pressure  Discussed with the primary team.  Subjective: Seen and examined at bedside.  Remains anuric.  Labs worsening.  Some wheezing and SOB this morning.  Denies nausea, vomiting, chest pain, headache or dizziness. Objective Vital signs in last 24 hours: Vitals:   06/14/19 1845 06/14/19 2029 06/15/19 0002 06/15/19 0800  BP: (!) 89/51 93/67 109/67 119/69  Pulse: 78 80 82 81  Resp: 18 18 16 13   Temp:  98 F (36.7 C) 97.7 F (36.5 C) 97.6 F (36.4 C)  TempSrc:  Oral Oral Oral  SpO2: 96% 93% 94% 93%  Weight:      Height:       Weight change:   Intake/Output Summary (Last 24 hours) at 06/15/2019 0959 Last data filed at 06/15/2019 4034 Gross per 24 hour  Intake 1102.26 ml  Output 250 ml  Net 852.26 ml       Labs: Basic Metabolic Panel: Recent Labs  Lab 06/13/19 0321 06/14/19 0121 06/15/19 0710  NA 138 135 139  K 4.3 4.1 4.5  CL 104 102 103  CO2 20* 18* 18*  GLUCOSE 138* 101* 82  BUN 36* 53* 73*  CREATININE 4.37* 5.75* 7.53*  CALCIUM 7.9* 7.4* 7.5*  PHOS  --  4.8* 6.7*   Liver Function Tests: Recent Labs  Lab 06/11/19 1243 06/11/19 1243 06/12/19 0209 06/12/19 0209 06/13/19 0321 06/14/19 0121 06/15/19 0710  AST 14*  --  14*  --  21  --   --   ALT 15  --  12  --  15  --   --  ALKPHOS 107  --  89  --  94  --   --   BILITOT 0.8  --  1.4*  --  1.1  --   --   PROT 6.9  --  5.9*  --  6.4*  --   --   ALBUMIN 3.5   < > 2.9*   < > 2.9* 2.5* 2.3*   < > = values in this interval not displayed.   Recent Labs  Lab 06/11/19 1243  LIPASE 15   No results for input(s): AMMONIA in the last 168 hours. CBC: Recent Labs  Lab 06/11/19 1243 06/11/19 1243 06/11/19 2020 06/11/19 2020 06/12/19 0209 06/12/19 0209 06/13/19 0321 06/14/19 0121 06/15/19 0710  WBC 15.3*   < > 21.5*   < > 24.4*   < >  26.1* 20.0* 14.0*  NEUTROABS 13.4*  --   --   --   --   --   --   --  11.6*  HGB 13.1   < > 12.7   < > 11.6*   < > 10.3* 9.3* 8.8*  HCT 43.0   < > 41.9   < > 38.6   < > 34.8* 30.7* 29.4*  MCV 87.2   < > 87.3  --  87.9  --  89.2 87.7 87.8  PLT 333   < > 308   < > 333   < > 287 277 272   < > = values in this interval not displayed.   Cardiac Enzymes: No results for input(s): CKTOTAL, CKMB, CKMBINDEX, TROPONINI in the last 168 hours. CBG: Recent Labs  Lab 06/14/19 1627 06/14/19 2117 06/15/19 0012 06/15/19 0442 06/15/19 0824  GLUCAP 83 73 84 79 73    Iron Studies: No results for input(s): IRON, TIBC, TRANSFERRIN, FERRITIN in the last 72 hours. Studies/Results: CT IMAGE GUIDED DRAINAGE BY PERCUTANEOUS CATHETER  Result Date: 06/13/2019 CLINICAL DATA:  Worsening diverticular abscess. EXAM: CT GUIDED DRAINAGE OF PELVIC ABSCESS ANESTHESIA/SEDATION: Intravenous Fentanyl 38mcg and Versed 1mg  were administered as conscious sedation during continuous monitoring of the patient's level of consciousness and physiological / cardiorespiratory status by the radiology RN, with a total moderate sedation time of 28 minutes. PROCEDURE: The procedure, risks, benefits, and alternatives were explained to the patient. Questions regarding the procedure were encouraged and answered. The patient understands and consents to the procedure. Select axial scans through the pelvis were obtained. The right pelvic fluid collection was localized and an appropriate skin entry site was determined and marked. The operative field was prepped with chlorhexidinein a sterile fashion, and a sterile drape was applied covering the operative field. A sterile gown and sterile gloves were used for the procedure. Local anesthesia was provided with 1% Lidocaine. Under CT fluoroscopic and helical CT guidance, 18 gauge trocar needle advanced into the collection. Thin cloudy fluid could be aspirated. Amplatz guidewire advanced easily within the  collection, position confirmed on CT. Tract dilated to facilitate placement of a 12 French pigtail drain catheter, formed centrally within the collection. Position confirmed on CT. 22 mL cloudy yellow aspirate sent for Gram stain and culture. Catheter secured externally with 0 Prolene suture and StatLock and placed to gravity drain bag. The patient tolerated the procedure well. COMPLICATIONS: None immediate FINDINGS: Right pelvic gas and fluid collection was localized corresponding to see previous CT findings. 12 French pigtail drain catheter placed as above. 22 mL cloudy yellow aspirate sent for Gram stain and culture. IMPRESSION: Technically successful CT-guided pelvic abscess drain catheter  placement . Electronically Signed   By: Lucrezia Europe M.D.   On: 06/13/2019 16:18    Medications: Infusions: . cefTRIAXone (ROCEPHIN)  IV 2 g (06/14/19 1506)  . furosemide 100 mg (06/15/19 0931)  . metronidazole 500 mg (06/15/19 8412)  . sodium chloride Stopped (06/12/19 0949)    Scheduled Medications: . buPROPion  450 mg Oral Daily  . Chlorhexidine Gluconate Cloth  6 each Topical Daily  . citalopram  40 mg Oral Daily  . enoxaparin (LOVENOX) injection  30 mg Subcutaneous Q24H  . insulin aspart  0-9 Units Subcutaneous Q4H  . metoprolol tartrate  2.5 mg Intravenous Q6H  . pantoprazole (PROTONIX) IV  40 mg Intravenous Q24H  . sodium bicarbonate  650 mg Oral TID  . sodium chloride flush  5 mL Intracatheter Q8H    have reviewed scheduled and prn medications.  Physical Exam: General:NAD, comfortable Heart:RRR, s1s2 nl, no rubs Lungs: Bibasal decreased breath sound, no increased work of breathing Abdomen:soft, non-distended, right lower quadrant drain Extremities: No LE edema Neurology: Alert awake and following commands, no asterixis  [name] 06/15/2019,9:59 AM  LOS: 4 days  Pager: [phone]

## 2019-06-15 NOTE — Progress Notes (Signed)
Patient transferred from bed to chair. Patient extremely weak and utilized two staff members for assistance. Patient had large bowel movement in bedside commode. Educated to stay up in chair for a few hours. Will continue to monitor.  Hiram Comber, RN 06/15/2019 2:45 PM

## 2019-06-15 NOTE — Progress Notes (Signed)
Patient ID: [name], female   DOB: 1961/01/09, 59 y.o.   MRN: [id number]          [facility]    Date of Admission:  06/11/2019    Total days of antibiotics 5        Day 2 ceftriaxone and metronidazole  She has become afebrile on therapy for sigmoid diverticulitis with perforation and abscess.  She was hypotensive yesterday afternoon and has worsening acute kidney injury with a creatinine that is up to 7.53 today.  She is not making any urine yet.  Admission blood cultures remain negative.  I will continue ceftriaxone and metronidazole.         [name], MD [facility] [phone] pager   [phone] cell 06/15/2019, 10:30 AM

## 2019-06-15 NOTE — Progress Notes (Addendum)
Patient has expiratory wheeze this morning on assessment that is audible without ausculation. Dr. Wyline Copas paged to notify. Chest x-ray ordered.    Patient had 60 cc's of amber, odorous urine output emptied. 65 cc's emptied from abdominal drain. Will continue to monitor.  Hiram Comber, RN 06/15/2019 8:54 AM

## 2019-06-16 ENCOUNTER — Inpatient Hospital Stay (HOSPITAL_COMMUNITY): Payer: Commercial Managed Care - PPO

## 2019-06-16 DIAGNOSIS — N179 Acute kidney failure, unspecified: Secondary | ICD-10-CM

## 2019-06-16 HISTORY — PX: IR US GUIDE VASC ACCESS RIGHT: IMG2390

## 2019-06-16 HISTORY — PX: IR FLUORO GUIDE CV LINE RIGHT: IMG2283

## 2019-06-16 LAB — CBC WITH DIFFERENTIAL/PLATELET
Abs Immature Granulocytes: 0.13 10*3/uL — ABNORMAL HIGH (ref 0.00–0.07)
Basophils Absolute: 0.1 10*3/uL (ref 0.0–0.1)
Basophils Relative: 1 %
Eosinophils Absolute: 0.3 10*3/uL (ref 0.0–0.5)
Eosinophils Relative: 2 %
HCT: 30.4 % — ABNORMAL LOW (ref 36.0–46.0)
Hemoglobin: 8.9 g/dL — ABNORMAL LOW (ref 12.0–15.0)
Immature Granulocytes: 1 %
Lymphocytes Relative: 11 %
Lymphs Abs: 1.3 10*3/uL (ref 0.7–4.0)
MCH: 26.2 pg (ref 26.0–34.0)
MCHC: 29.3 g/dL — ABNORMAL LOW (ref 30.0–36.0)
MCV: 89.4 fL (ref 80.0–100.0)
Monocytes Absolute: 1.1 10*3/uL — ABNORMAL HIGH (ref 0.1–1.0)
Monocytes Relative: 9 %
Neutro Abs: 9.4 10*3/uL — ABNORMAL HIGH (ref 1.7–7.7)
Neutrophils Relative %: 76 %
Platelets: 256 10*3/uL (ref 150–400)
RBC: 3.4 MIL/uL — ABNORMAL LOW (ref 3.87–5.11)
RDW: 15.4 % (ref 11.5–15.5)
WBC: 12.2 10*3/uL — ABNORMAL HIGH (ref 4.0–10.5)
nRBC: 0 % (ref 0.0–0.2)

## 2019-06-16 LAB — BLOOD CULTURE ID PANEL (REFLEXED)

## 2019-06-16 LAB — RENAL FUNCTION PANEL
Albumin: 2.3 g/dL — ABNORMAL LOW (ref 3.5–5.0)
Anion gap: 17 — ABNORMAL HIGH (ref 5–15)
BUN: 82 mg/dL — ABNORMAL HIGH (ref 6–20)
CO2: 15 mmol/L — ABNORMAL LOW (ref 22–32)
Calcium: 7.4 mg/dL — ABNORMAL LOW (ref 8.9–10.3)
Chloride: 104 mmol/L (ref 98–111)
Creatinine, Ser: 8.01 mg/dL — ABNORMAL HIGH (ref 0.44–1.00)
GFR calc Af Amer: 6 mL/min — ABNORMAL LOW (ref >60)
GFR calc non Af Amer: 5 mL/min — ABNORMAL LOW (ref >60)
Glucose, Bld: 102 mg/dL — ABNORMAL HIGH (ref 70–99)
Phosphorus: 7.5 mg/dL — ABNORMAL HIGH (ref 2.5–4.6)
Potassium: 4.4 mmol/L (ref 3.5–5.1)
Sodium: 136 mmol/L (ref 135–145)

## 2019-06-16 LAB — GLUCOSE, CAPILLARY
Glucose-Capillary: 100 mg/dL — ABNORMAL HIGH (ref 70–99)
Glucose-Capillary: 109 mg/dL — ABNORMAL HIGH (ref 70–99)
Glucose-Capillary: 113 mg/dL — ABNORMAL HIGH (ref 70–99)
Glucose-Capillary: 129 mg/dL — ABNORMAL HIGH (ref 70–99)
Glucose-Capillary: 131 mg/dL — ABNORMAL HIGH (ref 70–99)
Glucose-Capillary: 93 mg/dL (ref 70–99)

## 2019-06-16 LAB — CULTURE, BLOOD (ROUTINE X 2): Culture: NO GROWTH

## 2019-06-16 LAB — PREALBUMIN: Prealbumin: 6.7 mg/dL — ABNORMAL LOW (ref 18–38)

## 2019-06-16 MED ORDER — HEPARIN SODIUM (PORCINE) 1000 UNIT/ML IJ SOLN
INTRAMUSCULAR | Status: DC | PRN
  Administered 2019-06-16: 2.6 [IU] via INTRAVENOUS

## 2019-06-16 MED ORDER — LIDOCAINE HCL 1 % IJ SOLN
INTRAMUSCULAR | Status: DC | PRN
  Administered 2019-06-16: 10 mL

## 2019-06-16 MED ORDER — HEPARIN SODIUM (PORCINE) 1000 UNIT/ML IJ SOLN
INTRAMUSCULAR | Status: AC
  Filled 2019-06-16: qty 1

## 2019-06-16 MED ORDER — LIDOCAINE HCL 1 % IJ SOLN
INTRAMUSCULAR | Status: AC
  Filled 2019-06-16: qty 20

## 2019-06-16 MED ORDER — GLUCERNA SHAKE PO LIQD
237.0000 mL | Freq: Two times a day (BID) | ORAL | Status: DC
  Administered 2019-06-16: 237 mL via ORAL

## 2019-06-16 MED ORDER — NEPRO/CARBSTEADY PO LIQD
237.0000 mL | Freq: Two times a day (BID) | ORAL | Status: DC
  Administered 2019-06-16 – 2019-06-30 (×13): 237 mL via ORAL
  Filled 2019-06-16 (×2): qty 237

## 2019-06-16 NOTE — Progress Notes (Signed)
Supervising Physician: Daryll Brod  Patient Status: [facility] - In-pt  Subjective: S/p perc drain to RLQ on 6/4 for diverticular abscess. Feels like she is doing a litte better overall. Unfortunately renal function worsening and now in need of HD cath  Objective: Physical Exam: BP (!) 83/48   Pulse 71   Temp 97.7 F (36.5 C) (Oral)   Resp 15   Ht 5\' 8"  (1.727 m)   Wt (!) 174.2 kg   SpO2 95%   BMI 58.39 kg/m  RLQ drain intact Feculent output and bag full of gas.   Current Facility-Administered Medications:  .  heparin sodium (porcine) 1000 UNIT/ML injection, , , ,  .  lidocaine (XYLOCAINE) 1 % (with pres) injection, , , ,  .  acetaminophen (TYLENOL) tablet 650 mg, 650 mg, Oral, Q6H PRN, 650 mg at 06/14/19 1410 **OR** acetaminophen (TYLENOL) suppository 650 mg, 650 mg, Rectal, Q6H PRN, Donne Hazel, MD .  buPROPion (WELLBUTRIN XL) 24 hr tablet 450 mg, 450 mg, Oral, Daily, Donne Hazel, MD, 450 mg at 06/16/19 0858 .  cefTRIAXone (ROCEPHIN) 2 g in sodium chloride 0.9 % 100 mL IVPB, 2 g, Intravenous, Q24H, Donne Hazel, MD, Last Rate: 200 mL/hr at 06/15/19 1202, 2 g at 06/15/19 1202 .  Chlorhexidine Gluconate Cloth 2 % PADS 6 each, 6 each, Topical, Daily, Donne Hazel, MD, 6 each at 06/16/19 0901 .  citalopram (CELEXA) tablet 40 mg, 40 mg, Oral, Daily, Donne Hazel, MD, 40 mg at 06/16/19 0859 .  docusate sodium (COLACE) capsule 100 mg, 100 mg, Oral, BID PRN, Donne Hazel, MD .  enoxaparin (LOVENOX) injection 30 mg, 30 mg, Subcutaneous, Q24H, Donne Hazel, MD, 30 mg at 06/15/19 2030 .  feeding supplement (GLUCERNA SHAKE) (GLUCERNA SHAKE) liquid 237 mL, 237 mL, Oral, BID BM, Meuth, Brooke A, PA-C, 237 mL at 06/16/19 1040 .  gabapentin (NEURONTIN) capsule 400 mg, 400 mg, Oral, QHS PRN, Donne Hazel, MD, 400 mg at 06/15/19 2120 .  HYDROmorphone (DILAUDID) injection 0.5-1 mg, 0.5-1 mg, Intravenous, Q2H PRN, Donne Hazel, MD, 1 mg at 06/16/19 0752 .   insulin aspart (novoLOG) injection 0-9 Units, 0-9 Units, Subcutaneous, Q4H, Donne Hazel, MD, Stopped at 06/16/19 [phone] .  labetalol (NORMODYNE) injection 5 mg, 5 mg, Intravenous, Q2H PRN, Donne Hazel, MD .  LORazepam (ATIVAN) injection 0.5 mg, 0.5 mg, Intravenous, Q6H PRN, Donne Hazel, MD .  metroNIDAZOLE (FLAGYL) IVPB 500 mg, 500 mg, Intravenous, Q8H, Donne Hazel, MD, Last Rate: 100 mL/hr at 06/16/19 0600, 500 mg at 06/16/19 0600 .  ondansetron (ZOFRAN) tablet 4 mg, 4 mg, Oral, Q6H PRN **OR** ondansetron (ZOFRAN) injection 4 mg, 4 mg, Intravenous, Q6H PRN, Donne Hazel, MD, 4 mg at 06/12/19 0203 .  pantoprazole (PROTONIX) injection 40 mg, 40 mg, Intravenous, Q24H, Donne Hazel, MD, 40 mg at 06/16/19 0859 .  sodium bicarbonate tablet 650 mg, 650 mg, Oral, TID, Rosita Fire, MD, 650 mg at 06/16/19 0859 .  sodium chloride 0.9 % bolus 500 mL, 500 mL, Intravenous, Once, Donne Hazel, MD, Stopped at 06/12/19 [phone] .  sodium chloride flush (NS) 0.9 % injection 5 mL, 5 mL, Intracatheter, Q8H, Donne Hazel, MD, 5 mL at 06/16/19 0634  Labs: CBC Recent Labs    06/15/19 0710 06/16/19 0419  WBC 14.0* 12.2*  HGB 8.8* 8.9*  HCT 29.4* 30.4*  PLT 272 256   BMET Recent Labs  06/15/19 0710 06/16/19 0419  NA 139 136  K 4.5 4.4  CL 103 104  CO2 18* 15*  GLUCOSE 82 102*  BUN 73* 82*  CREATININE 7.53* 8.01*  CALCIUM 7.5* 7.4*   LFT Recent Labs    06/16/19 0419  ALBUMIN 2.3*   PT/INR No results for input(s): LABPROT, INR in the last 72 hours.   Studies/Results: DG CHEST PORT 1 VIEW  Result Date: 06/15/2019 CLINICAL DATA:  Expiratory wheezes this morning. EXAM: PORTABLE CHEST 1 VIEW COMPARISON:  06/11/2019 FINDINGS: Heart is mildly enlarged. There is mild perihilar infiltrate consistent with mild edema. More focal opacity in the MEDIAL LEFT lung base may represent atelectasis or developing infiltrate. IMPRESSION: 1. Cardiomegaly and mild edema. 2. MEDIAL  LEFT lower lobe atelectasis or infiltrate. Electronically Signed   By: Nolon Nations M.D.   On: 06/15/2019 11:21    Assessment/Plan: RLQ abscess s/p perc drain 6/4 Clinical findings suspicious for fistula. Cont bag drainage and drain care/flushes to ensure function Will place temp HD cath today as requested. Risks and benefits of image guided dialysis catheter placement was discussed with the patient including, but not limited to bleeding, infection, pneumothorax, or fibrin sheath development and need for additional procedures.  All of the patient's questions were answered, patient is agreeable to proceed. Consent signed and in chart.      LOS: 5 days   I spent a total of 15 minutes in face to face in clinical consultation, greater than 50% of which was counseling/coordinating care for HD cath placement and RLQ abscess drain  Ascencion Dike PA-C 06/16/2019 12:03 PM

## 2019-06-16 NOTE — Progress Notes (Signed)
[facility]  Date of Admission:  06/11/2019     Total days of antibiotics 5         ASSESSMENT:  [name] has worsening renal function and now with temporary dialysis cathter. Percutaneous drain catheter patent with feculent material and gas present. Appears to be tolerating ceftriaxone and metronidazole at present for diverticulitis with perforation and abscess. Blood culture with 1/4 bottles with gram variable rod with no identification on BCID. Will continue current dose of ceftriaxone and metronidazole. Renal management per nephrology.    PLAN:  1. Continue ceftriaxone and metronidazole. 2. Renal management per nephrology 3. Percutaneous drain management per IR. 4. Monitor blood cultures for identification of gram variable organism.   Principal Problem:   Acute diverticulitis Active Problems:   Obesity (BMI 30-39.9)   ILD (interstitial lung disease) ([place])   Hypertension   Acute kidney injury (AKI) with acute tubular necrosis (ATN) ([place])   . buPROPion  450 mg Oral Daily  . Chlorhexidine Gluconate Cloth  6 each Topical Daily  . citalopram  40 mg Oral Daily  . enoxaparin (LOVENOX) injection  30 mg Subcutaneous Q24H  . feeding supplement (NEPRO CARB STEADY)  237 mL Oral BID BM  . heparin sodium (porcine)      . insulin aspart  0-9 Units Subcutaneous Q4H  . lidocaine      . pantoprazole (PROTONIX) IV  40 mg Intravenous Q24H  . sodium bicarbonate  650 mg Oral TID  . sodium chloride flush  5 mL Intracatheter Q8H    SUBJECTIVE:  Afebrile overnight. Worsening renal function and now with temporary dialysis catheter. Continues to have abdominal pain that is exacerbated with food at times. Able to only eat for about 30 seconds at a time. Denies fevers, chills or sweats.   Allergies  Allergen Reactions  . Guaifenesin Nausea And Vomiting and Other (See Comments)    un  . Mucinex [Guaifenesin Er] Nausea And Vomiting  . Sulfa Antibiotics Nausea And  Vomiting     Review of Systems: Review of Systems  Constitutional: Negative for chills, fever and weight loss.  Respiratory: Negative for cough, shortness of breath and wheezing.   Cardiovascular: Negative for chest pain and leg swelling.  Gastrointestinal: Positive for abdominal pain. Negative for constipation, diarrhea, nausea and vomiting.  Skin: Negative for rash.      OBJECTIVE: Vitals:   06/16/19 0729 06/16/19 1140 06/16/19 1312 06/16/19 1606  BP: 111/68 (!) 83/48 (!) 92/56 93/67  Pulse: 84 71 75 79  Resp: 17 15 17 14   Temp: 97.7 F (36.5 C)  97.8 F (36.6 C)   TempSrc: Oral  Oral   SpO2: 90% 95% 96% 92%  Weight:      Height:       Body mass index is 58.39 kg/m.  Physical Exam Constitutional:      General: She is not in acute distress.    Appearance: She is well-developed. She is obese.  Cardiovascular:     Rate and Rhythm: Normal rate and regular rhythm.     Heart sounds: Normal heart sounds.     Comments: Dialysis catheter present right chest/neck Pulmonary:     Effort: Pulmonary effort is normal.     Breath sounds: Normal breath sounds.  Abdominal:     Palpations: There is no mass.     Tenderness: There is abdominal tenderness. There is no guarding.     Comments: Feculent material and gas in percutaneous drain on  right side.   Skin:    General: Skin is warm and dry.  Neurological:     Mental Status: She is alert and oriented to person, place, and time.  Psychiatric:        Behavior: Behavior normal.        Thought Content: Thought content normal.        Judgment: Judgment normal.     Lab Results Lab Results  Component Value Date   WBC 12.2 (H) 06/16/2019   HGB 8.9 (L) 06/16/2019   HCT 30.4 (L) 06/16/2019   MCV 89.4 06/16/2019   PLT 256 06/16/2019    Lab Results  Component Value Date   CREATININE 8.01 (H) 06/16/2019   BUN 82 (H) 06/16/2019   NA 136 06/16/2019   K 4.4 06/16/2019   CL 104 06/16/2019   CO2 15 (L) 06/16/2019    Lab  Results  Component Value Date   ALT 15 06/13/2019   AST 21 06/13/2019   ALKPHOS 94 06/13/2019   BILITOT 1.1 06/13/2019     Microbiology: Recent Results (from the past 240 hour(s))  SARS Coronavirus 2 by RT PCR (hospital order, performed in [place] hospital lab) Nasopharyngeal Nasopharyngeal Swab     Status: None   Collection Time: 06/11/19  6:16 PM   Specimen: Nasopharyngeal Swab  Result Value Ref Range Status   SARS Coronavirus 2 NEGATIVE NEGATIVE Final    Comment: (NOTE) SARS-CoV-2 target nucleic acids are NOT DETECTED. The SARS-CoV-2 RNA is generally detectable in upper and lower respiratory specimens during the acute phase of infection. The lowest concentration of SARS-CoV-2 viral copies this assay can detect is 250 copies / mL. A negative result does not preclude SARS-CoV-2 infection and should not be used as the sole basis for treatment or other patient management decisions.  A negative result may occur with improper specimen collection / handling, submission of specimen other than nasopharyngeal swab, presence of viral mutation(s) within the areas targeted by this assay, and inadequate number of viral copies (<250 copies / mL). A negative result must be combined with clinical observations, patient history, and epidemiological information. Fact Sheet for Patients:   StrictlyIdeas.no Fact Sheet for Healthcare Providers: BankingDealers.co.za This test is not yet approved or cleared  by the [place] FDA and has been authorized for detection and/or diagnosis of SARS-CoV-2 by FDA under an Emergency Use Authorization (EUA).  This EUA will remain in effect (meaning this test can be used) for the duration of the COVID-19 declaration under Section 564(b)(1) of the Act, 21 U.S.C. section 360bbb-3(b)(1), unless the authorization is terminated or revoked sooner. Performed at [facility], [place] [address] [place],  [place] [address]   Culture, blood (Routine X 2) w Reflex to ID Panel     Status: Abnormal (Preliminary result)   Collection Time: 06/11/19  8:20 PM   Specimen: BLOOD  Result Value Ref Range Status   Specimen Description BLOOD BLOOD LEFT FOREARM  Final   Special Requests   Final    BOTTLES DRAWN AEROBIC AND ANAEROBIC Blood Culture results may not be optimal due to an inadequate volume of blood received in culture bottles   Culture  Setup Time (A)  Final    GRAM VARIABLE ROD ANAEROBIC BOTTLE ONLY CRITICAL RESULT CALLED TO, READ BACK BY AND VERIFIED WITH: PHARMD J FRENS AT [place] 06/16/19 BY L BENFIELD Performed at [facility], [place] [address], [place], [place] [address]    Culture GRAM VARIABLE ROD (  A)  Final   Report Status PENDING  Incomplete  Blood Culture ID Panel (Reflexed)     Status: None   Collection Time: 06/11/19  8:20 PM  Result Value Ref Range Status   Enterococcus species NOT DETECTED NOT DETECTED Final   Listeria monocytogenes NOT DETECTED NOT DETECTED Final   Staphylococcus species NOT DETECTED NOT DETECTED Final   Staphylococcus aureus (BCID) NOT DETECTED NOT DETECTED Final   Streptococcus species NOT DETECTED NOT DETECTED Final   Streptococcus agalactiae NOT DETECTED NOT DETECTED Final   Streptococcus pneumoniae NOT DETECTED NOT DETECTED Final   Streptococcus pyogenes NOT DETECTED NOT DETECTED Final   Acinetobacter baumannii NOT DETECTED NOT DETECTED Final   Enterobacteriaceae species NOT DETECTED NOT DETECTED Final   Enterobacter cloacae complex NOT DETECTED NOT DETECTED Final   Escherichia coli NOT DETECTED NOT DETECTED Final   Klebsiella oxytoca NOT DETECTED NOT DETECTED Final   Klebsiella pneumoniae NOT DETECTED NOT DETECTED Final   Proteus species NOT DETECTED NOT DETECTED Final   Serratia marcescens NOT DETECTED NOT DETECTED Final   Haemophilus influenzae NOT DETECTED NOT DETECTED Final   Neisseria meningitidis NOT DETECTED NOT DETECTED Final   Pseudomonas  aeruginosa NOT DETECTED NOT DETECTED Final   Candida albicans NOT DETECTED NOT DETECTED Final   Candida glabrata NOT DETECTED NOT DETECTED Final   Candida krusei NOT DETECTED NOT DETECTED Final   Candida parapsilosis NOT DETECTED NOT DETECTED Final   Candida tropicalis NOT DETECTED NOT DETECTED Final    Comment: Performed at [facility], [address], [place], [place] [address]  Culture, blood (Routine X 2) w Reflex to ID Panel     Status: None   Collection Time: 06/11/19 10:00 PM   Specimen: BLOOD  Result Value Ref Range Status   Specimen Description BLOOD RIGHT HAND  Final   Special Requests   Final    BOTTLES DRAWN AEROBIC ONLY Blood Culture results may not be optimal due to an inadequate volume of blood received in culture bottles   Culture   Final    NO GROWTH 5 DAYS Performed at [facility], [place] [address] [place], [place] [address]    Report Status 06/16/2019 FINAL  Final  MRSA PCR Screening     Status: None   Collection Time: 06/15/19  9:26 AM   Specimen: Nasopharyngeal  Result Value Ref Range Status   MRSA by PCR NEGATIVE NEGATIVE Final    Comment:        The GeneXpert MRSA Assay (FDA approved for NASAL specimens only), is one component of a comprehensive MRSA colonization surveillance program. It is not intended to diagnose MRSA infection nor to guide or monitor treatment for MRSA infections. Performed at [facility], [place] [address], [place], [place] [address]      Terri Piedra, [facility]  06/16/2019  4:46 PM

## 2019-06-16 NOTE — Progress Notes (Signed)
[facility] Progress Note     Subjective: CC-  States that she had a BM last night and one this morning. She had a lot of pain with BM. Otherwise abdomen about the same. Denies n/v. Tolerating CLD without worsening pain. She did get OOB with PT yesterday. Drain with 90cc feculent drainage WBC down to 12.2, afebrile. Cr up 8.01  Objective: Vital signs in last 24 hours: Temp:  [97.7 F (36.5 C)-98.2 F (36.8 C)] 97.7 F (36.5 C) (06/07 0729) Pulse Rate:  [74-84] 84 (06/07 0729) Resp:  [14-19] 17 (06/07 0729) BP: (92-111)/(63-98) 111/68 (06/07 0729) SpO2:  [90 %-94 %] 90 % (06/07 0729) Last BM Date: 06/15/19  Intake/Output from previous day: 06/06 0701 - 06/07 0700 In: 819 [P.O.:564; I.V.:5; IV Piggyback:250] Out: 156 [Urine:65; Drains:90; Stool:1] Intake/Output this shift: Total I/O In: 120 [P.O.:120] Out: 50 [Drains:50]  PE: Gen:  Alert, NAD HEENT: face is flushed Card:  RRR Pulm:  rate and effort normal Abd: obese, soft, ?mild distension, + BS, RLQ drain with air and feculent fluid in bag, TTP around drain otherwise nontender  Lab Results:  Recent Labs    06/15/19 0710 06/16/19 0419  WBC 14.0* 12.2*  HGB 8.8* 8.9*  HCT 29.4* 30.4*  PLT 272 256   BMET Recent Labs    06/15/19 0710 06/16/19 0419  NA 139 136  K 4.5 4.4  CL 103 104  CO2 18* 15*  GLUCOSE 82 102*  BUN 73* 82*  CREATININE 7.53* 8.01*  CALCIUM 7.5* 7.4*   PT/INR Recent Labs    06/13/19 1015  LABPROT 15.8*  INR 1.3*   CMP     Component Value Date/Time   NA 136 06/16/2019 0419   NA 139 12/24/2017 1641   K 4.4 06/16/2019 0419   CL 104 06/16/2019 0419   CO2 15 (L) 06/16/2019 0419   GLUCOSE 102 (H) 06/16/2019 0419   BUN 82 (H) 06/16/2019 0419   BUN 15 12/24/2017 1641   CREATININE 8.01 (H) 06/16/2019 0419   CALCIUM 7.4 (L) 06/16/2019 0419   PROT 6.4 (L) 06/13/2019 0321   ALBUMIN 2.3 (L) 06/16/2019 0419   AST 21 06/13/2019 0321   ALT 15 06/13/2019 0321   ALKPHOS 94  06/13/2019 0321   BILITOT 1.1 06/13/2019 0321   GFRNONAA 5 (L) 06/16/2019 0419   GFRAA 6 (L) 06/16/2019 0419   Lipase     Component Value Date/Time   LIPASE 15 06/11/2019 1243       Studies/Results: DG CHEST PORT 1 VIEW  Result Date: 06/15/2019 CLINICAL DATA:  Expiratory wheezes this morning. EXAM: PORTABLE CHEST 1 VIEW COMPARISON:  06/11/2019 FINDINGS: Heart is mildly enlarged. There is mild perihilar infiltrate consistent with mild edema. More focal opacity in the MEDIAL LEFT lung base may represent atelectasis or developing infiltrate. IMPRESSION: 1. Cardiomegaly and mild edema. 2. MEDIAL LEFT lower lobe atelectasis or infiltrate. Electronically Signed   By: Nolon Nations M.D.   On: 06/15/2019 11:21    Anti-infectives: Anti-infectives (From admission, onward)   Start     Dose/Rate Route Frequency Ordered Stop   06/14/19 1330  cefTRIAXone (ROCEPHIN) 2 g in sodium chloride 0.9 % 100 mL IVPB     2 g 200 mL/hr over 30 Minutes Intravenous Every 24 hours 06/14/19 1316     06/14/19 1330  metroNIDAZOLE (FLAGYL) IVPB 500 mg     500 mg 100 mL/hr over 60 Minutes Intravenous Every 8 hours 06/14/19 1316     06/13/19 2200  piperacillin-tazobactam (ZOSYN) IVPB 3.375 g  Status:  Discontinued     3.375 g 12.5 mL/hr over 240 Minutes Intravenous Every 12 hours 06/13/19 1111 06/14/19 1316   06/11/19 2330  piperacillin-tazobactam (ZOSYN) IVPB 3.375 g  Status:  Discontinued     3.375 g 12.5 mL/hr over 240 Minutes Intravenous Every 8 hours 06/11/19 1852 06/13/19 1111   06/11/19 1715  piperacillin-tazobactam (ZOSYN) IVPB 3.375 g     3.375 g 100 mL/hr over 30 Minutes Intravenous  Once 06/11/19 1714 06/11/19 1942       Assessment/Plan 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 Nephrology following Urinary Retention - Foley placed 6/4 Malnutrition - prealbumin 6.7  Acute  sigmoid diverticulitis with perforation and abscess S/p IR drain 6/4, culture pending             - 2nd bout,history of diverticulitis in 2014             - last colonoscopy >10 years ago             - CT scan6/3showed progression of acute sigmoid diverticulitis now with evidence of perforation, increasing fluid in the right lower quadrant with gas fluid level, no rim enhancement to suggest organized fluid collection or abscess at this time  ID - zosyn 6/2>>6/5, rocephin/flagyl 6/5>> FEN - IVF, FLD VTE - SCDs, lovenox Foley - in place Follow up - TBD  Plan: WBC continues to downtrend. Advance to full liquids. Will ask dietician to see. Continue drain and monitor output. Continue IV antibiotics (switched to rocephin/flagyl due to worsening renal failure). Continue PT, mobilize.    LOS: 5 days    [facility] 06/16/2019, 10:03 AM Please see Amion for pager number during day hours 7:00am-4:30pm

## 2019-06-16 NOTE — Procedures (Signed)
Interventional Radiology Procedure Note  Procedure: RT IJ TEMP hd CATH  Complications: None  Estimated Blood Loss: MIN  Findings: TIP SVCRA

## 2019-06-16 NOTE — Progress Notes (Signed)
PROGRESS NOTE    [name]  YKZ:[id number] DOB: 09/15/1960 DOA: 06/11/2019 PCP: Christain [place], MD    Brief Narrative:  59 y.o. 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 Pt reportedly underwent D/C procedure on 5/25. Very shortly after the procedure, pt recalled abdominal "swelling" with subsequent abd pain. Pt believed herself to be constipated, thus was prescribed miralax. Pt had several BM without resolution of symptoms. Symptoms progressed and worsened, prompting visit to ED  ED Course: In the ED, pt was found to have WBC of 15k with HR of 110's with RR in the 20's. CT abd/pelvis was performed, which was notable for sigmoid diverticulitis. Pt was started on zosyn. Hospitalist consulted for consideration for admission.  Assessment & Plan:   Principal Problem:   Acute diverticulitis Active Problems:   Obesity (BMI 30-39.9)   ILD (interstitial lung disease) ([place])   Hypertension   Acute kidney injury (AKI) with acute tubular necrosis (ATN) ([place])  1. Acute sigmoid diverticulitis with sepsis present on admit 1. Pt with leukocytosis, tachycardia, tachypnea 2. Initial CT abd personally reviewed. Findings of acute sigmoid diverticulitis without perforation 3. Given immunosuppressed state, have asked ID to follow 4. Given worsening symptoms, repeat CT obtained and reviewed with Radiologist. Findings worrisome for new perforation without abscess collection 5. Consulted General Surgery who recommended IR consult for drain placement.  6. Drain placed on 6/4 with purulent material expressed 7. WBC continues to improve, down to 12 today 8. Was initially on zosyn, however now changed to rocephin with flagyl given below renal failure 9. Cont  current abx. General Surgery and ID following 2. DM2 1. Recent a1c of 7.0 2. Holding oral hyperglycemic regimen while in hospital 3. Continue on SSI coverage as needed 4. Glucose trends thus far stable 3. Morbid obesity 1. Recommend diet/lifestyle modification 4. HTN 1. Stopped entresto 2. Had been on scheduled low dose IV beta blocker. 3. Given periods of low BP, will transition to PRN labetalol only and had discontinued scheduled beta blocker 5. 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 Had been on cellcept and methotrexate. Given acute infection, will continue to hold 2. Seems to be stable at present 3. No evidence of hemoptysis at this time 6. Hx microscopic polyangiitis 1. Followed [facility]um at [facility] 2. Historically required high dosed steroids, rituximab, plasmapheresis in the past and ultimately is continued on cellcept and methotrexate 3. Given acute infection, continue to hold immunomodulators at this time 4. Recommend f/u with Rheumatologist after pt recovers from acute illness to determine if/when to resume immunosuppressive tx 7. Acute renal failure, oliguiric 1. Suspected ATN 2. UA on presentation without blood or protein 3. Consulted Nephrology for assistance, appreciate input 4. Cr up to over 8 today with minimal urine output, no improvement despite high dose lasix and basal IVF 5. Plan for placement of temporary HD cath and initiation of HD, Nephrology cont to follow  DVT prophylaxis: Lovenox subq Code Status: Full Family Communication: Pt in room, family currently at bedside  Status is: Inpatient  Remains inpatient appropriate because:Persistent severe electrolyte disturbances, Unsafe d/c plan, IV treatments appropriate due to intensity of illness or inability to take PO and Inpatient level of care appropriate due to severity of illness  Dispo: The patient is from: Home  Anticipated d/c is to: Home              Anticipated d/c date is: > 3 days               Patient currently is not medically stable to d/c.  Consultants:   ID  General Surgery  IR  Nephrology  Procedures:   IR guided drain placement 6/4  Antimicrobials: Anti-infectives (From admission, onward)   Start     Dose/Rate Route Frequency Ordered Stop   06/14/19 1330  cefTRIAXone (ROCEPHIN) 2 g in sodium chloride 0.9 % 100 mL IVPB     2 g 200 mL/hr over 30 Minutes Intravenous Every 24 hours 06/14/19 1316     06/14/19 1330  metroNIDAZOLE (FLAGYL) IVPB 500 mg     500 mg 100 mL/hr over 60 Minutes Intravenous Every 8 hours 06/14/19 1316     06/13/19 2200  piperacillin-tazobactam (ZOSYN) IVPB 3.375 g  Status:  Discontinued     3.375 g 12.5 mL/hr over 240 Minutes Intravenous Every 12 hours 06/13/19 1111 06/14/19 1316   06/11/19 2330  piperacillin-tazobactam (ZOSYN) IVPB 3.375 g  Status:  Discontinued     3.375 g 12.5 mL/hr over 240 Minutes Intravenous Every 8 hours 06/11/19 1852 06/13/19 1111   06/11/19 1715  piperacillin-tazobactam (ZOSYN) IVPB 3.375 g     3.375 g 100 mL/hr over 30 Minutes Intravenous  Once 06/11/19 1714 06/11/19 1942      Subjective: Feeling more tired   Objective: Vitals:   06/16/19 0729 06/16/19 1140 06/16/19 1312 06/16/19 1606  BP: 111/68 (!) 83/48 (!) 92/56 93/67  Pulse: 84 71 75 79  Resp: 17 15 17 14   Temp: 97.7 F (36.5 C)  97.8 F (36.6 C)   TempSrc: Oral  Oral   SpO2: 90% 95% 96% 92%  Weight:      Height:        Intake/Output Summary (Last 24 hours) at 06/16/2019 1728 Last data filed at 06/16/2019 1445 Gross per 24 hour  Intake 680 ml  Output 95 ml  Net 585 ml   Filed Weights   06/11/19 1128  Weight: (!) 174.2 kg    Examination: General exam: Awake, laying in bed, in nad Respiratory system: Normal respiratory effort, no wheezing Cardiovascular system: regular rate, s1, s2 Gastrointestinal system: Soft, nondistended, positive BS Central nervous system: CN2-12 grossly intact, strength intact Extremities: Perfused,  no clubbing, generally edematous Skin: Normal skin turgor, no notable skin lesions seen Psychiatry: Mood normal // no visual hallucinations   Data Reviewed: I have personally reviewed following labs and imaging studies  CBC: Recent Labs  Lab 06/11/19 1243 06/11/19 2020 06/12/19 0209 06/13/19 0321 06/14/19 0121 06/15/19 0710 06/16/19 0419  WBC 15.3*   < > 24.4* 26.1* 20.0* 14.0* 12.2*  NEUTROABS 13.4*  --   --   --   --  11.6* 9.4*  HGB 13.1   < > 11.6* 10.3* 9.3* 8.8* 8.9*  HCT 43.0   < > 38.6 34.8* 30.7* 29.4* 30.4*  MCV 87.2   < > 87.9 89.2 87.7 87.8 89.4  PLT 333   < > 333 287 277 272 256   < > = values in this interval not displayed.   Basic Metabolic Panel: Recent Labs  Lab 06/12/19 0209 06/13/19 0321 06/14/19 0121 06/15/19 0710 06/16/19 0419  NA 140 138 135 139 136  K 4.2 4.3 4.1 4.5 4.4  CL 107 104 102 103 104  CO2 23 20* 18* 18* 15*  GLUCOSE 145* 138* 101* 82 102*  BUN 19 36* 53* 73* 82*  CREATININE 1.66* 4.37* 5.75* 7.53* 8.01*  CALCIUM 8.2* 7.9* 7.4* 7.5* 7.4*  PHOS  --   --  4.8* 6.7* 7.5*   GFR: Estimated Creatinine Clearance: 13.1 mL/min (A) (by C-G formula based on SCr of 8.01 mg/dL (H)). Liver Function Tests: Recent Labs  Lab 06/11/19 1243 06/11/19 1243 06/12/19 0209 06/13/19 0321 06/14/19 0121 06/15/19 0710 06/16/19 0419  AST 14*  --  14* 21  --   --   --   ALT 15  --  12 15  --   --   --   ALKPHOS 107  --  89 94  --   --   --   BILITOT 0.8  --  1.4* 1.1  --   --   --   PROT 6.9  --  5.9* 6.4*  --   --   --   ALBUMIN 3.5   < > 2.9* 2.9* 2.5* 2.3* 2.3*   < > = values in this interval not displayed.   Recent Labs  Lab 06/11/19 1243  LIPASE 15   No results for input(s): AMMONIA in the last 168 hours. Coagulation Profile: Recent Labs  Lab 06/13/19 1015  INR 1.3*   Cardiac Enzymes: No results for input(s): CKTOTAL, CKMB, CKMBINDEX, TROPONINI in the last 168 hours. BNP (last 3 results) No results for input(s): PROBNP in the last  8760 hours. HbA1C: No results for input(s): HGBA1C in the last 72 hours. CBG: Recent Labs  Lab 06/16/19 0002 06/16/19 0426 06/16/19 0727 06/16/19 1139 06/16/19 1604  GLUCAP 113* 100* 93 109* 129*   Lipid Profile: No results for input(s): CHOL, HDL, LDLCALC, TRIG, CHOLHDL, LDLDIRECT in the last 72 hours. Thyroid Function Tests: No results for input(s): TSH, T4TOTAL, FREET4, T3FREE, THYROIDAB in the last 72 hours. Anemia Panel: No results for input(s): VITAMINB12, FOLATE, FERRITIN, TIBC, IRON, RETICCTPCT in the last 72 hours. Sepsis Labs: No results for input(s): PROCALCITON, LATICACIDVEN in the last 168 hours.  Recent Results (from the past 240 hour(s))  SARS Coronavirus 2 by RT PCR (hospital order, performed in [facility] hospital lab) Nasopharyngeal Nasopharyngeal Swab     Status: None   Collection Time: 06/11/19  6:16 PM   Specimen: Nasopharyngeal Swab  Result Value Ref Range Status   SARS Coronavirus 2 NEGATIVE NEGATIVE Final    Comment: (NOTE) SARS-CoV-2 target nucleic acids are NOT DETECTED. The SARS-CoV-2 RNA is generally detectable in upper and lower respiratory specimens during the acute phase of infection. The lowest concentration of SARS-CoV-2 viral copies this assay can detect is 250 copies / mL. A negative result does not preclude SARS-CoV-2 infection and should not be used as the sole basis for treatment or other patient management decisions.  A negative result may occur with improper specimen collection / handling, submission of specimen other than nasopharyngeal swab, presence of viral mutation(s) within the areas targeted by this assay, and inadequate number of viral copies (<250 copies / mL). A negative result must be combined with clinical observations, patient history, and epidemiological information. Fact Sheet for Patients:   StrictlyIdeas.no Fact Sheet for Healthcare Providers: BankingDealers.co.za This  test is not yet approved or cleared  by the [place] FDA and has been authorized for detection and/or diagnosis of SARS-CoV-2 by FDA under an Emergency Use Authorization (EUA).  This EUA will remain in effect (meaning this test can be used) for the duration of the COVID-19 declaration  under Section 564(b)(1) of the Act, 21 U.S.C. section 360bbb-3(b)(1), unless the authorization is terminated or revoked sooner. Performed at [facility], [place] [address] [place], [place] [address]   Culture, blood (Routine X 2) w Reflex to ID Panel     Status: Abnormal (Preliminary result)   Collection Time: 06/11/19  8:20 PM   Specimen: BLOOD  Result Value Ref Range Status   Specimen Description BLOOD BLOOD LEFT FOREARM  Final   Special Requests   Final    BOTTLES DRAWN AEROBIC AND ANAEROBIC Blood Culture results may not be optimal due to an inadequate volume of blood received in culture bottles   Culture  Setup Time (A)  Final    GRAM VARIABLE ROD ANAEROBIC BOTTLE ONLY CRITICAL RESULT CALLED TO, READ BACK BY AND VERIFIED WITH: PHARMD J FRENS AT [place] 06/16/19 BY L BENFIELD Performed at [facility], [place] [address] [place] [address] [place], [place] [address]    Culture GRAM VARIABLE ROD (A)  Final   Report Status PENDING  Incomplete  Blood Culture ID Panel (Reflexed)     Status: None   Collection Time: 06/11/19  8:20 PM  Result Value Ref Range Status   Enterococcus species NOT DETECTED NOT DETECTED Final   Listeria monocytogenes NOT DETECTED NOT DETECTED Final   Staphylococcus species NOT DETECTED NOT DETECTED Final   Staphylococcus aureus (BCID) NOT DETECTED NOT DETECTED Final   Streptococcus species NOT DETECTED NOT DETECTED Final   Streptococcus agalactiae NOT DETECTED NOT DETECTED Final   Streptococcus pneumoniae NOT DETECTED NOT DETECTED Final   Streptococcus pyogenes NOT DETECTED NOT DETECTED Final   Acinetobacter baumannii NOT DETECTED NOT DETECTED Final   Enterobacteriaceae species NOT  DETECTED NOT DETECTED Final   Enterobacter cloacae complex NOT DETECTED NOT DETECTED Final   Escherichia coli NOT DETECTED NOT DETECTED Final   Klebsiella oxytoca NOT DETECTED NOT DETECTED Final   Klebsiella pneumoniae NOT DETECTED NOT DETECTED Final   Proteus species NOT DETECTED NOT DETECTED Final   Serratia marcescens NOT DETECTED NOT DETECTED Final   Haemophilus influenzae NOT DETECTED NOT DETECTED Final   Neisseria meningitidis NOT DETECTED NOT DETECTED Final   Pseudomonas aeruginosa NOT DETECTED NOT DETECTED Final   Candida albicans NOT DETECTED NOT DETECTED Final   Candida glabrata NOT DETECTED NOT DETECTED Final   Candida krusei NOT DETECTED NOT DETECTED Final   Candida parapsilosis NOT DETECTED NOT DETECTED Final   Candida tropicalis NOT DETECTED NOT DETECTED Final    Comment: Performed at [facility], [place]. [address] [place], [place] [address]  Culture, blood (Routine X 2) w Reflex to ID Panel     Status: None   Collection Time: 06/11/19 10:00 PM   Specimen: BLOOD  Result Value Ref Range Status   Specimen Description BLOOD RIGHT HAND  Final   Special Requests   Final    BOTTLES DRAWN AEROBIC ONLY Blood Culture results may not be optimal due to an inadequate volume of blood received in culture bottles   Culture   Final    NO GROWTH 5 DAYS Performed at [facility], [place] [address] [place], [place] [address]    Report Status 06/16/2019 FINAL  Final  MRSA PCR Screening     Status: None   Collection Time: 06/15/19  9:26 AM   Specimen: Nasopharyngeal  Result Value Ref Range Status   MRSA by PCR NEGATIVE NEGATIVE Final    Comment:        The GeneXpert  MRSA Assay (FDA approved for NASAL specimens only), is one component of a comprehensive MRSA colonization surveillance program. It is not intended to diagnose MRSA infection nor to guide or monitor treatment for MRSA infections. Performed at [facility], [place] [address] [place], [place] [address]        Radiology Studies: IR Fluoro Guide CV Line Right  Result Date: 06/16/2019 INDICATION: Acute kidney injury, access for temporary dialysis EXAM: ULTRASOUND GUIDANCE FOR VASCULAR ACCESS RIGHT IJ TEMPORARY DIALYSIS CATHETER MEDICATIONS: 1% LIDOCAINE LOCAL ANESTHESIA/SEDATION: Moderate Sedation Time: None. The patient's level of consciousness and vital signs were monitored continuously by radiology nursing throughout the procedure under my direct supervision. FLUOROSCOPY TIME:  Fluoroscopy Time: 0 minutes 24 seconds (12 mGy). COMPLICATIONS: None immediate. PROCEDURE: Informed written consent was obtained from the patient after a thorough discussion of the procedural risks, benefits and alternatives. All questions were addressed. Maximal Sterile Barrier Technique was utilized including caps, mask, sterile gowns, sterile gloves, sterile drape, hand hygiene and skin antiseptic. A timeout was performed prior to the initiation of the procedure. Under sterile conditions and local anesthesia, ultrasound micropuncture access performed of the right internal jugular vein. Images obtained for documentation of the patent right internal jugular vein with ultrasound. Guidewire advanced followed by the micro dilator. Amplatz guidewire inserted. Tract dilatation performed to insert a 16 cm Mahurkar catheter. Tip SVC RA junction. Images obtained for documentation. Catheter secured Prolene sutures. Blood aspirated easily followed by saline and heparin flushes. Appropriate volume strength of heparin instilled in all 3 lumens followed by external caps. Sterile dressing applied. No immediate complication. Patient tolerated the procedure well. IMPRESSION: Successful ultrasound and fluoroscopic right IJ temporary dialysis catheter. Tip SVC RA junction. Ready for use. Electronically Signed   By: Jerilynn Mages.  Shick M.D.   On: 06/16/2019 13:20   IR US Guide Vasc Access Right  Result Date: 06/16/2019 INDICATION: Acute kidney injury, access for  temporary dialysis EXAM: ULTRASOUND GUIDANCE FOR VASCULAR ACCESS RIGHT IJ TEMPORARY DIALYSIS CATHETER MEDICATIONS: 1% LIDOCAINE LOCAL ANESTHESIA/SEDATION: Moderate Sedation Time: None. The patient's level of consciousness and vital signs were monitored continuously by radiology nursing throughout the procedure under my direct supervision. FLUOROSCOPY TIME:  Fluoroscopy Time: 0 minutes 24 seconds (12 mGy). COMPLICATIONS: None immediate. PROCEDURE: Informed written consent was obtained from the patient after a thorough discussion of the procedural risks, benefits and alternatives. All questions were addressed. Maximal Sterile Barrier Technique was utilized including caps, mask, sterile gowns, sterile gloves, sterile drape, hand hygiene and skin antiseptic. A timeout was performed prior to the initiation of the procedure. Under sterile conditions and local anesthesia, ultrasound micropuncture access performed of the right internal jugular vein. Images obtained for documentation of the patent right internal jugular vein with ultrasound. Guidewire advanced followed by the micro dilator. Amplatz guidewire inserted. Tract dilatation performed to insert a 16 cm Mahurkar catheter. Tip SVC RA junction. Images obtained for documentation. Catheter secured Prolene sutures. Blood aspirated easily followed by saline and heparin flushes. Appropriate volume strength of heparin instilled in all 3 lumens followed by external caps. Sterile dressing applied. No immediate complication. Patient tolerated the procedure well. IMPRESSION: Successful ultrasound and fluoroscopic right IJ temporary dialysis catheter. Tip SVC RA junction. Ready for use. Electronically Signed   By: Jerilynn Mages.  Shick M.D.   On: 06/16/2019 13:20   DG CHEST PORT 1 VIEW  Result Date: 06/15/2019 CLINICAL DATA:  Expiratory wheezes this morning. EXAM: PORTABLE CHEST 1 VIEW COMPARISON:  06/11/2019 FINDINGS: Heart is mildly enlarged. There  is mild perihilar infiltrate  consistent with mild edema. More focal opacity in the MEDIAL LEFT lung base may represent atelectasis or developing infiltrate. IMPRESSION: 1. Cardiomegaly and mild edema. 2. MEDIAL LEFT lower lobe atelectasis or infiltrate. Electronically Signed   By: Nolon Nations M.D.   On: 06/15/2019 11:21    Scheduled Meds: . buPROPion  450 mg Oral Daily  . Chlorhexidine Gluconate Cloth  6 each Topical Daily  . citalopram  40 mg Oral Daily  . enoxaparin (LOVENOX) injection  30 mg Subcutaneous Q24H  . feeding supplement (NEPRO CARB STEADY)  237 mL Oral BID BM  . heparin sodium (porcine)      . insulin aspart  0-9 Units Subcutaneous Q4H  . lidocaine      . pantoprazole (PROTONIX) IV  40 mg Intravenous Q24H  . sodium bicarbonate  650 mg Oral TID  . sodium chloride flush  5 mL Intracatheter Q8H   Continuous Infusions: . cefTRIAXone (ROCEPHIN)  IV 2 g (06/16/19 1306)  . metronidazole 500 mg (06/16/19 1401)  . sodium chloride Stopped (06/12/19 0949)     LOS: 5 days   Marylu Lund, MD [facility] Pager On Amion  If 7PM-7AM, please contact night-coverage 06/16/2019, 5:28 PM

## 2019-06-16 NOTE — Progress Notes (Signed)
Patient ID: [name], female   DOB: 04/13/1960, 59 y.o.   MRN: [id number] [facility] Progress Note   Assessment/ Plan:   1. Acute kidney Injury: Anuric, hemodynamically mediated likely with evolution to ATN in the setting of hypotension, sepsis and contrast exposure.  Previously on NSAID (meloxicam) prior to admission.  With no response to intravenous furosemide yesterday and will plan for placement of dialysis catheter today for initiation of hemodialysis to help promote volume unloading/manage azotemia as this may be complicating her abdominal symptoms.  She does not have any critical electrolyte abnormalities or severe uremic signs. 2.  Sepsis secondary to perforated diverticulitis/intra-abdominal infection: Status post right lower quadrant drain placed by IR with antibiotic therapy changed to ceftriaxone/Flagyl. 3.  History of ANCA vasculitis/microscopic polyangiitis: On maintenance with CellCept and methotrexate which are currently on hold secondary to sepsis.  Previously treated with corticosteroids/PLEX and rituximab. 4.  Non-anion gap metabolic acidosis: Secondary to acute kidney injury and GI losses, monitor on sodium bicarbonate. 5.  Hypotension: Blood pressures under acceptable values on supportive management, off Entresto.  Subjective:   She reports to be feeling fair and complains of severe abdominal pain.   Objective:   BP 111/68 (BP Location: Right Arm)   Pulse 84   Temp 97.7 F (36.5 C) (Oral)   Resp 17   Ht 5\' 8"  (1.727 m)   Wt (!) 174.2 kg   SpO2 90%   BMI 58.39 kg/m   Intake/Output Summary (Last 24 hours) at 06/16/2019 1040 Last data filed at 06/16/2019 0901 Gross per 24 hour  Intake 939 ml  Output 81 ml  Net 858 ml   Weight change:   Physical Exam: Gen: Obese woman who appears to be uncomfortable resting in bed CVS: Pulse regular rhythm, normal rate, S1 and S2 normal Resp: Clear to auscultation without any distinct rales or rhonchi Abd:  Soft, obese, right lower quadrant tenderness with drain in situ. Ext: 1+ lower extremity edema  Imaging: DG CHEST PORT 1 VIEW  Result Date: 06/15/2019 CLINICAL DATA:  Expiratory wheezes this morning. EXAM: PORTABLE CHEST 1 VIEW COMPARISON:  06/11/2019 FINDINGS: Heart is mildly enlarged. There is mild perihilar infiltrate consistent with mild edema. More focal opacity in the MEDIAL LEFT lung base may represent atelectasis or developing infiltrate. IMPRESSION: 1. Cardiomegaly and mild edema. 2. MEDIAL LEFT lower lobe atelectasis or infiltrate. Electronically Signed   By: Nolon Nations M.D.   On: 06/15/2019 11:21    Labs: BMET Recent Labs  Lab 06/11/19 1243 06/11/19 2020 06/12/19 0209 06/13/19 0321 06/14/19 0121 06/15/19 0710 06/16/19 0419  NA 139  --  140 138 135 139 136  K 4.4  --  4.2 4.3 4.1 4.5 4.4  CL 102  --  107 104 102 103 104  CO2 25  --  23 20* 18* 18* 15*  GLUCOSE 159*  --  145* 138* 101* 82 102*  BUN 19  --  19 36* 53* 73* 82*  CREATININE 1.03* 0.98 1.66* 4.37* 5.75* 7.53* 8.01*  CALCIUM 8.9  --  8.2* 7.9* 7.4* 7.5* 7.4*  PHOS  --   --   --   --  4.8* 6.7* 7.5*   CBC Recent Labs  Lab 06/11/19 1243 06/11/19 2020 06/13/19 0321 06/14/19 0121 06/15/19 0710 06/16/19 0419  WBC 15.3*   < > 26.1* 20.0* 14.0* 12.2*  NEUTROABS 13.4*  --   --   --  11.6* 9.4*  HGB 13.1   < > 10.3*  9.3* 8.8* 8.9*  HCT 43.0   < > 34.8* 30.7* 29.4* 30.4*  MCV 87.2   < > 89.2 87.7 87.8 89.4  PLT 333   < > 287 277 272 256   < > = values in this interval not displayed.    Medications:    . buPROPion  450 mg Oral Daily  . Chlorhexidine Gluconate Cloth  6 each Topical Daily  . citalopram  40 mg Oral Daily  . enoxaparin (LOVENOX) injection  30 mg Subcutaneous Q24H  . feeding supplement (GLUCERNA SHAKE)  237 mL Oral BID BM  . insulin aspart  0-9 Units Subcutaneous Q4H  . pantoprazole (PROTONIX) IV  40 mg Intravenous Q24H  . sodium bicarbonate  650 mg Oral TID  . sodium chloride flush   5 mL Intracatheter Q8H   Elmarie Shiley, MD 06/16/2019, 10:40 AM

## 2019-06-16 NOTE — Progress Notes (Addendum)
Patient going to IR, temp HD cath in place; dressing clean, dry, intact. Alert and oriented x 4; no acute distress noted.

## 2019-06-16 NOTE — Evaluation (Signed)
Physical Therapy Evaluation Patient Details Name: [name] MRN: [id number] DOB: Jun 25, 1960 Today's Date: 06/16/2019   History of Present Illness  59 y.o. 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 She presented to the ED with complaints of marked lower quadrant pain. Pt admitted for sigmoid diverticulitis with perforation and abscess. Drain placed 6/4.    Clinical Impression  Pt admitted with above diagnosis. PTA pt lived at home with family. She was independent with mobilityADLs, driving and able to do her grocery shopping. On eval, she required min/mod assist bed mobility, min assist sit to stand with RW, and min assist sidestepping with RW bedside. +2 utilized for safety. Session limited by hypotension. BP 89/71 sitting EOB with pt c/o feeling dizzy/lightheaded, requiring return to supine. Pt also with acute kidney failure, little to no urine output. Awaiting nephrology to determine need for possible HD initiation.  Pt currently with functional limitations due to the deficits listed below (see PT Problem List). Pt will benefit from skilled PT to increase their independence and safety with mobility to allow discharge to the venue listed below.  Pt should progress well with mobility as medical status improves. Pt understands possible need to consider SNF, if prolonged hospitalization results in further decline in strength/mobility. Family is able to provide 24-hour assist, if needed. She has a ramp to enter her mobile home.     Follow Up Recommendations Home health PT;Supervision/Assistance - 24 hour    Equipment Recommendations  None recommended by PT    Recommendations for Other Services OT consult     Precautions / Restrictions Precautions Precautions: Fall;Other (comment) Precaution Comments: check BP (hypotensive) Restrictions Weight Bearing  Restrictions: No      Mobility  Bed Mobility Overal bed mobility: Needs Assistance Bed Mobility: Rolling;Sidelying to Sit;Sit to Supine Rolling: Min assist Sidelying to sit: +2 for safety/equipment;Mod assist;HOB elevated   Sit to supine: +2 for safety/equipment;Min assist   General bed mobility comments: heavy use of bed rails, cues for sequencing, increased time  Transfers Overall transfer level: Needs assistance Equipment used: Rolling walker (2 wheeled) Transfers: Sit to/from Stand Sit to Stand: +2 safety/equipment;Min assist         General transfer comment: Pt used momentum to power up.  Ambulation/Gait Ambulation/Gait assistance: Min assist   Assistive device: Rolling walker (2 wheeled)       General Gait Details: Sidestepping with RW bedside  Stairs            Wheelchair Mobility    Modified Rankin (Stroke Patients Only)       Balance Overall balance assessment: Needs assistance Sitting-balance support: No upper extremity supported;Feet supported Sitting balance-Leahy Scale: Fair     Standing balance support: Bilateral upper extremity supported;During functional activity Standing balance-Leahy Scale: Poor Standing balance comment: reliant on external support; Static stand with RW bedside for NT to complete bath/hygiene.                             Pertinent Vitals/Pain Pain Assessment: Faces Faces Pain Scale: Hurts even more Pain Location: abdomen (R lower quadrant) Pain Descriptors / Indicators: Grimacing;Guarding;Discomfort Pain Intervention(s): Monitored during session;Repositioned;Limited activity within patient's tolerance    Home Living Family/patient expects to be discharged to:: Private residence Living Arrangements: Spouse/significant other;Other relatives Available Help at Discharge: Family;Available 24 hours/day Type of Home: Mobile home Home Access:  Ramped entrance     Home Layout: One level Home Equipment:  [employer] - 2 wheels      Prior Function Level of Independence: Independent         Comments: Drives. Grocery shops.     Hand Dominance        Extremity/Trunk Assessment   Upper Extremity Assessment Upper Extremity Assessment: Defer to OT evaluation    Lower Extremity Assessment Lower Extremity Assessment: Generalized weakness       Communication   Communication: No difficulties  Cognition Arousal/Alertness: Awake/alert Behavior During Therapy: WFL for tasks assessed/performed Overall Cognitive Status: Within Functional Limits for tasks assessed                                        General Comments General comments (skin integrity, edema, etc.): Mobility limited by hypotension. BP 89/71 sitting EOB with pt c/o feeling lightheaded/dizzy.    Exercises     Assessment/Plan    PT Assessment Patient needs continued PT services  PT Problem List Decreased strength;Decreased mobility;Decreased activity tolerance;Pain;Decreased balance;Obesity       PT Treatment Interventions Therapeutic activities;DME instruction;Gait training;Therapeutic exercise;Patient/family education;Balance training;Functional mobility training    PT Goals (Current goals can be found in the Care Plan section)  Acute Rehab PT Goals Patient Stated Goal: home PT Goal Formulation: With patient Time For Goal Achievement: 06/30/19 Potential to Achieve Goals: Good    Frequency Min 3X/week   Barriers to discharge        Co-evaluation               AM-PAC PT "6 Clicks" Mobility  Outcome Measure Help needed turning from your back to your side while in a flat bed without using bedrails?: A Lot Help needed moving from lying on your back to sitting on the side of a flat bed without using bedrails?: A Lot Help needed moving to and from a bed to a chair (including a wheelchair)?: A Lot Help needed standing up from a chair using your arms (e.g., wheelchair or bedside chair)?: A  Little Help needed to walk in hospital room?: A Lot Help needed climbing 3-5 steps with a railing? : A Lot 6 Click Score: 13    End of Session   Activity Tolerance: Treatment limited secondary to medical complications (Comment)(hypotension) Patient left: in bed;with nursing/sitter in room;with call bell/phone within reach Nurse Communication: Mobility status PT Visit Diagnosis: Muscle weakness (generalized) (M62.81);Difficulty in walking, not elsewhere classified (R26.2)    Time: 9509-3267 PT Time Calculation (min) (ACUTE ONLY): 23 min   Charges:   PT Evaluation $PT Eval Moderate Complexity: 1 Mod PT Treatments $Therapeutic Activity: 8-22 mins        Lorrin Goodell, PT  Office # [phone] Pager [phone]   Lorriane Shire 06/16/2019, 9:34 AM

## 2019-06-16 NOTE — Progress Notes (Addendum)
Initial Nutrition Assessment  DOCUMENTATION CODES:   Morbid obesity  INTERVENTION:  Provide Nepro Shake po BID, each supplement provides 425 kcal and 19 grams protein.  Encourage adequate PO intake.   NUTRITION DIAGNOSIS:   Increased nutrient needs related to acute illness(AKI) as evidenced by estimated needs.  GOAL:   Patient will meet greater than or equal to 90% of their needs  MONITOR:   PO intake, Supplement acceptance, Skin, Weight trends, Labs, I & O's, Diet advancement  REASON FOR ASSESSMENT:   Consult Assessment of nutrition requirement/status  ASSESSMENT:   59 y.o. 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 CT abd/pelvis was performed, which was notable for sigmoid diverticulitis. S/p perc drain to RLQ on 6/4 for diverticular abscess.  Pt unavailable during attempted time of contact. Per MD pt with acute renal failure. RT IJ temp HD cath placed today for initiation of hemodialysis to help promote volume managing and azotemia. Meal completion has been 50%. RD to order nutritional supplements to aid in caloric and protein needs while on a full liquid diet.  Unable to complete Nutrition-Focused physical exam at this time.   Labs and medications reviewed.   Diet Order:   Diet Order            Diet full liquid Room service appropriate? Yes; Fluid consistency: Thin  Diet effective now              EDUCATION NEEDS:   Not appropriate for education at this time  Skin:  Skin Assessment: Reviewed RN Assessment  Last BM:  6/6  Height:   Ht Readings from Last 1 Encounters:  06/11/19 5\' 8"  (1.727 m)    Weight:   Wt Readings from Last 1 Encounters:  06/11/19 (!) 174.2 kg   BMI:  Body mass index is 58.39 kg/m.  Estimated Nutritional Needs:   Kcal:  2000-2300  Protein:  100-120  grams  Fluid:  >/= 2 L/day   Corrin Parker, MS, RD, LDN RD pager number/after hours weekend pager number on Amion.

## 2019-06-16 NOTE — Progress Notes (Signed)
PHARMACY - PHYSICIAN COMMUNICATION CRITICAL VALUE ALERT - BLOOD CULTURE IDENTIFICATION (BCID)  [name] is an 59 y.o. female who presented to [facility] on 06/11/2019 with diverticulitis.  Has been on Zosyn followed by ceftriaxone and metronidazole.  Blood culture from 6/2 now positive for a gram variable rod in the anaerobic bottle,  BCID negative  Assessment:  Gram variable rod in one anaerobic bottle - unsure if true pathogen or contamination.  If an anaerobe is isolated, it should be covered by metronidazole  Name of physician (or Provider) Contacted: Dr. Wyline Copas and ID team  Current antibiotics: ceftriaxone + metronidazole  Changes to prescribed antibiotics recommended: None - continue same antibiotics   Results for orders placed or performed during the hospital encounter of 06/11/19  Blood Culture ID Panel (Reflexed) (Collected: 06/11/2019  8:20 PM)  Result Value Ref Range   Enterococcus species NOT DETECTED NOT DETECTED   Listeria monocytogenes NOT DETECTED NOT DETECTED   Staphylococcus species NOT DETECTED NOT DETECTED   Staphylococcus aureus (BCID) NOT DETECTED NOT DETECTED   Streptococcus species NOT DETECTED NOT DETECTED   Streptococcus agalactiae NOT DETECTED NOT DETECTED   Streptococcus pneumoniae NOT DETECTED NOT DETECTED   Streptococcus pyogenes NOT DETECTED NOT DETECTED   Acinetobacter baumannii NOT DETECTED NOT DETECTED   Enterobacteriaceae species NOT DETECTED NOT DETECTED   Enterobacter cloacae complex NOT DETECTED NOT DETECTED   Escherichia coli NOT DETECTED NOT DETECTED   Klebsiella oxytoca NOT DETECTED NOT DETECTED   Klebsiella pneumoniae NOT DETECTED NOT DETECTED   Proteus species NOT DETECTED NOT DETECTED   Serratia marcescens NOT DETECTED NOT DETECTED   Haemophilus influenzae NOT DETECTED NOT DETECTED   Neisseria meningitidis NOT DETECTED NOT DETECTED   Pseudomonas aeruginosa NOT DETECTED NOT DETECTED   Candida albicans NOT DETECTED NOT DETECTED    Candida glabrata NOT DETECTED NOT DETECTED   Candida krusei NOT DETECTED NOT DETECTED   Candida parapsilosis NOT DETECTED NOT DETECTED   Candida tropicalis NOT DETECTED NOT DETECTED    Candie Mile 06/16/2019  8:18 AM

## 2019-06-17 LAB — COMPREHENSIVE METABOLIC PANEL
ALT: 22 U/L (ref 0–44)
AST: 29 U/L (ref 15–41)
Albumin: 2.4 g/dL — ABNORMAL LOW (ref 3.5–5.0)
Alkaline Phosphatase: 100 U/L (ref 38–126)
Anion gap: 19 — ABNORMAL HIGH (ref 5–15)
BUN: 91 mg/dL — ABNORMAL HIGH (ref 6–20)
CO2: 13 mmol/L — ABNORMAL LOW (ref 22–32)
Calcium: 7.6 mg/dL — ABNORMAL LOW (ref 8.9–10.3)
Chloride: 102 mmol/L (ref 98–111)
Creatinine, Ser: 9.31 mg/dL — ABNORMAL HIGH (ref 0.44–1.00)
GFR calc Af Amer: 5 mL/min — ABNORMAL LOW (ref >60)
GFR calc non Af Amer: 4 mL/min — ABNORMAL LOW (ref >60)
Glucose, Bld: 86 mg/dL (ref 70–99)
Potassium: 4.7 mmol/L (ref 3.5–5.1)
Sodium: 134 mmol/L — ABNORMAL LOW (ref 135–145)
Total Bilirubin: 0.3 mg/dL (ref 0.3–1.2)
Total Protein: 5.8 g/dL — ABNORMAL LOW (ref 6.5–8.1)

## 2019-06-17 LAB — CBC
HCT: 29.7 % — ABNORMAL LOW (ref 36.0–46.0)
Hemoglobin: 9.3 g/dL — ABNORMAL LOW (ref 12.0–15.0)
MCH: 26.7 pg (ref 26.0–34.0)
MCHC: 31.3 g/dL (ref 30.0–36.0)
MCV: 85.3 fL (ref 80.0–100.0)
Platelets: 275 10*3/uL (ref 150–400)
RBC: 3.48 MIL/uL — ABNORMAL LOW (ref 3.87–5.11)
RDW: 15.4 % (ref 11.5–15.5)
WBC: 13 10*3/uL — ABNORMAL HIGH (ref 4.0–10.5)
nRBC: 0 % (ref 0.0–0.2)

## 2019-06-17 LAB — GLUCOSE, CAPILLARY
Glucose-Capillary: 107 mg/dL — ABNORMAL HIGH (ref 70–99)
Glucose-Capillary: 136 mg/dL — ABNORMAL HIGH (ref 70–99)
Glucose-Capillary: 94 mg/dL (ref 70–99)
Glucose-Capillary: 96 mg/dL (ref 70–99)
Glucose-Capillary: 99 mg/dL (ref 70–99)

## 2019-06-17 LAB — HEPATITIS B SURFACE ANTIBODY,QUALITATIVE: Hep B S Ab: NONREACTIVE

## 2019-06-17 LAB — HEPATITIS B SURFACE ANTIGEN: Hepatitis B Surface Ag: NONREACTIVE

## 2019-06-17 LAB — HEPATITIS B CORE ANTIBODY, TOTAL: Hep B Core Total Ab: NONREACTIVE

## 2019-06-17 MED ORDER — HEPARIN SODIUM (PORCINE) 1000 UNIT/ML IJ SOLN
INTRAMUSCULAR | Status: AC
  Administered 2019-06-17: 2600 [IU] via ARTERIOVENOUS_FISTULA
  Filled 2019-06-17: qty 3

## 2019-06-17 NOTE — Progress Notes (Signed)
Patient is in dialysis. She left the floor before shift change.

## 2019-06-17 NOTE — Progress Notes (Signed)
Referring Physician(s): Margie Billet, PA-C  Supervising Physician: Aletta Edouard  Patient Status:  [facility] - In-pt  Chief Complaint: Follow up RLQ diverticular abscess drain placed 06/13/19 by Dr. Vernard Gambles  Subjective:  Patient just returned from dialysis, reports ongoing abdominal pain not really changed in the last few days - IV medicine helps.  Allergies: Guaifenesin, Mucinex [guaifenesin er], and Sulfa antibiotics  Medications: Prior to Admission medications   Medication Sig Start Date End Date Taking? Authorizing Provider  ALPRAZolam Duanne Moron) 1 MG tablet Take 1 tablet (1 mg total) by mouth 3 (three) times daily as needed for anxiety. 08/22/13  Yes Blanchie Serve, MD  buPROPion (WELLBUTRIN XL) 150 MG 24 hr tablet Take 450 mg by mouth daily.   Yes [provider]  carvedilol (COREG) 12.5 MG tablet TAKE 1 TABLET BY MOUTH TWICE A DAY Patient taking differently: Take 12.5 mg by mouth 2 (two) times daily with a meal.  02/26/19  Yes Josue Hector, MD  citalopram (CELEXA) 40 MG tablet Take 40 mg by mouth daily.   Yes [provider]  clobetasol cream (TEMOVATE) 2.70 % Apply 1 application topically 2 (two) times daily as needed (paronychia).  04/08/19  Yes [provider]  ENTRESTO 49-51 MG TAKE 1 TABLET BY MOUTH TWICE A DAY Patient taking differently: Take 1 tablet by mouth 2 (two) times daily.  02/26/19  Yes Josue Hector, MD  esomeprazole (NEXIUM) 20 MG capsule Take 40 mg by mouth daily.    Yes [provider]  folic acid (FOLVITE) 1 MG tablet Take 1 mg by mouth daily.    Yes [provider]  gabapentin (NEURONTIN) 400 MG capsule Take 400 mg by mouth at bedtime as needed (pain).  04/08/19  Yes [provider]  meloxicam (MOBIC) 15 MG tablet Take 15 mg by mouth daily.  04/08/19  Yes [provider]  metFORMIN (GLUCOPHAGE-XR) 500 MG 24 hr tablet Take 500 mg by mouth daily with breakfast.  10/03/17  Yes [provider]    methotrexate (50 MG/ML) 1 g injection Inject into the vein once a week.    Yes [provider]  Multiple Vitamin (MULTIVITAMIN) tablet Take 2 tablets by mouth daily.    Yes [provider]  mycophenolate (CELLCEPT) 500 MG tablet Take 1,500 mg by mouth 2 (two) times daily.  06/05/17  Yes [provider]  polyethylene glycol (MIRALAX / GLYCOLAX) packet Take 17 g by mouth daily as needed for moderate constipation (constipation).    Yes [provider]  dextromethorphan (DELSYM) 30 MG/5ML liquid Take 15 mg by mouth 2 (two) times daily as needed for cough.     [provider]  Fluticasone-Salmeterol (ADVAIR) 100-50 MCG/DOSE AEPB Inhale 1 puff into the lungs 2 (two) times daily as needed (asthma).     [provider]  oxyCODONE (OXY IR/ROXICODONE) 5 MG immediate release tablet Take 5 mg by mouth every 6 (six) hours as needed for pain. 05/28/19   [provider]  phentermine (ADIPEX-P) 37.5 MG tablet Take 37.5 mg by mouth daily. 04/06/18   [provider]  progesterone (PROMETRIUM) 200 MG capsule Take 200 mg by mouth at bedtime. 05/28/19   [provider]     Vital Signs: BP 114/81 (BP Location: Right Arm)   Pulse 91   Temp 98.2 F (36.8 C) (Oral)   Resp 19   Ht 5\' 8"  (1.727 m)   Wt (!) 406 lb 12 oz (184.5 kg)  SpO2 95%   BMI 61.85 kg/m   Physical Exam Vitals and nursing note reviewed.  HENT:     Head: Normocephalic.  Cardiovascular:     Rate and Rhythm: Normal rate.  Pulmonary:     Effort: Pulmonary effort is normal.  Abdominal:     Palpations: Abdomen is soft.     Tenderness: There is abdominal tenderness (RLQ and RUQ).     Comments: (+) RLQ drain to gravity with feculent output and gas in bag. Flushes/aspirates easily.  Skin:    General: Skin is warm and dry.  Neurological:     Mental Status: She is alert. Mental status is at baseline.     Imaging: IR Fluoro Guide CV Line Right  Result Date:  06/16/2019 INDICATION: Acute kidney injury, access for temporary dialysis EXAM: ULTRASOUND GUIDANCE FOR VASCULAR ACCESS RIGHT IJ TEMPORARY DIALYSIS CATHETER MEDICATIONS: 1% LIDOCAINE LOCAL ANESTHESIA/SEDATION: Moderate Sedation Time: None. The patient's level of consciousness and vital signs were monitored continuously by radiology nursing throughout the procedure under my direct supervision. FLUOROSCOPY TIME:  Fluoroscopy Time: 0 minutes 24 seconds (12 mGy). COMPLICATIONS: None immediate. PROCEDURE: Informed written consent was obtained from the patient after a thorough discussion of the procedural risks, benefits and alternatives. All questions were addressed. Maximal Sterile Barrier Technique was utilized including caps, mask, sterile gowns, sterile gloves, sterile drape, hand hygiene and skin antiseptic. A timeout was performed prior to the initiation of the procedure. Under sterile conditions and local anesthesia, ultrasound micropuncture access performed of the right internal jugular vein. Images obtained for documentation of the patent right internal jugular vein with ultrasound. Guidewire advanced followed by the micro dilator. Amplatz guidewire inserted. Tract dilatation performed to insert a 16 cm Mahurkar catheter. Tip SVC RA junction. Images obtained for documentation. Catheter secured Prolene sutures. Blood aspirated easily followed by saline and heparin flushes. Appropriate volume strength of heparin instilled in all 3 lumens followed by external caps. Sterile dressing applied. No immediate complication. Patient tolerated the procedure well. IMPRESSION: Successful ultrasound and fluoroscopic right IJ temporary dialysis catheter. Tip SVC RA junction. Ready for use. Electronically Signed   By: Jerilynn Mages.  Shick M.D.   On: 06/16/2019 13:20   IR US Guide Vasc Access Right  Result Date: 06/16/2019 INDICATION: Acute kidney injury, access for temporary dialysis EXAM: ULTRASOUND GUIDANCE FOR VASCULAR ACCESS RIGHT IJ  TEMPORARY DIALYSIS CATHETER MEDICATIONS: 1% LIDOCAINE LOCAL ANESTHESIA/SEDATION: Moderate Sedation Time: None. The patient's level of consciousness and vital signs were monitored continuously by radiology nursing throughout the procedure under my direct supervision. FLUOROSCOPY TIME:  Fluoroscopy Time: 0 minutes 24 seconds (12 mGy). COMPLICATIONS: None immediate. PROCEDURE: Informed written consent was obtained from the patient after a thorough discussion of the procedural risks, benefits and alternatives. All questions were addressed. Maximal Sterile Barrier Technique was utilized including caps, mask, sterile gowns, sterile gloves, sterile drape, hand hygiene and skin antiseptic. A timeout was performed prior to the initiation of the procedure. Under sterile conditions and local anesthesia, ultrasound micropuncture access performed of the right internal jugular vein. Images obtained for documentation of the patent right internal jugular vein with ultrasound. Guidewire advanced followed by the micro dilator. Amplatz guidewire inserted. Tract dilatation performed to insert a 16 cm Mahurkar catheter. Tip SVC RA junction. Images obtained for documentation. Catheter secured Prolene sutures. Blood aspirated easily followed by saline and heparin flushes. Appropriate volume strength of heparin instilled in all 3 lumens followed by external caps. Sterile dressing applied. No immediate complication. Patient tolerated the procedure well.  IMPRESSION: Successful ultrasound and fluoroscopic right IJ temporary dialysis catheter. Tip SVC RA junction. Ready for use. Electronically Signed   By: Jerilynn Mages.  Shick M.D.   On: 06/16/2019 13:20   DG CHEST PORT 1 VIEW  Result Date: 06/15/2019 CLINICAL DATA:  Expiratory wheezes this morning. EXAM: PORTABLE CHEST 1 VIEW COMPARISON:  06/11/2019 FINDINGS: Heart is mildly enlarged. There is mild perihilar infiltrate consistent with mild edema. More focal opacity in the MEDIAL LEFT lung base may  represent atelectasis or developing infiltrate. IMPRESSION: 1. Cardiomegaly and mild edema. 2. MEDIAL LEFT lower lobe atelectasis or infiltrate. Electronically Signed   By: Nolon Nations M.D.   On: 06/15/2019 11:21   CT IMAGE GUIDED DRAINAGE BY PERCUTANEOUS CATHETER  Result Date: 06/13/2019 CLINICAL DATA:  Worsening diverticular abscess. EXAM: CT GUIDED DRAINAGE OF PELVIC ABSCESS ANESTHESIA/SEDATION: Intravenous Fentanyl 38mcg and Versed 1mg  were administered as conscious sedation during continuous monitoring of the patient's level of consciousness and physiological / cardiorespiratory status by the radiology RN, with a total moderate sedation time of 28 minutes. PROCEDURE: The procedure, risks, benefits, and alternatives were explained to the patient. Questions regarding the procedure were encouraged and answered. The patient understands and consents to the procedure. Select axial scans through the pelvis were obtained. The right pelvic fluid collection was localized and an appropriate skin entry site was determined and marked. The operative field was prepped with chlorhexidinein a sterile fashion, and a sterile drape was applied covering the operative field. A sterile gown and sterile gloves were used for the procedure. Local anesthesia was provided with 1% Lidocaine. Under CT fluoroscopic and helical CT guidance, 18 gauge trocar needle advanced into the collection. Thin cloudy fluid could be aspirated. Amplatz guidewire advanced easily within the collection, position confirmed on CT. Tract dilated to facilitate placement of a 12 French pigtail drain catheter, formed centrally within the collection. Position confirmed on CT. 22 mL cloudy yellow aspirate sent for Gram stain and culture. Catheter secured externally with 0 Prolene suture and StatLock and placed to gravity drain bag. The patient tolerated the procedure well. COMPLICATIONS: None immediate FINDINGS: Right pelvic gas and fluid collection was  localized corresponding to see previous CT findings. 12 French pigtail drain catheter placed as above. 22 mL cloudy yellow aspirate sent for Gram stain and culture. IMPRESSION: Technically successful CT-guided pelvic abscess drain catheter placement . Electronically Signed   By: Lucrezia Europe M.D.   On: 06/13/2019 16:18    Labs:  CBC: Recent Labs    06/14/19 0121 06/15/19 0710 06/16/19 0419 06/17/19 0221  WBC 20.0* 14.0* 12.2* 13.0*  HGB 9.3* 8.8* 8.9* 9.3*  HCT 30.7* 29.4* 30.4* 29.7*  PLT 277 272 256 275    COAGS: Recent Labs    06/13/19 1015  INR 1.3*    BMP: Recent Labs    06/14/19 0121 06/15/19 0710 06/16/19 0419 06/17/19 0221  NA 135 139 136 134*  K 4.1 4.5 4.4 4.7  CL 102 103 104 102  CO2 18* 18* 15* 13*  GLUCOSE 101* 82 102* 86  BUN 53* 73* 82* 91*  CALCIUM 7.4* 7.5* 7.4* 7.6*  CREATININE 5.75* 7.53* 8.01* 9.31*  GFRNONAA 7* 5* 5* 4*  GFRAA 9* 6* 6* 5*    LIVER FUNCTION TESTS: Recent Labs    06/11/19 1243 06/11/19 1243 06/12/19 0209 06/12/19 0209 06/13/19 0321 06/13/19 0321 06/14/19 0121 06/15/19 0710 06/16/19 0419 06/17/19 0221  BILITOT 0.8  --  1.4*  --  1.1  --   --   --   --  0.3  AST 14*  --  14*  --  21  --   --   --   --  29  ALT 15  --  12  --  15  --   --   --   --  22  ALKPHOS 107  --  89  --  94  --   --   --   --  100  PROT 6.9  --  5.9*  --  6.4*  --   --   --   --  5.8*  ALBUMIN 3.5   < > 2.9*   < > 2.9*   < > 2.5* 2.3* 2.3* 2.4*   < > = values in this interval not displayed.    Assessment and Plan:  59 y/o F with history of diverticulitis and abscess formation s/p RLQ drain placement 06/13/19 in IR seen today for drain follow up.  Patient continues to report abdominal pain, unchanged from pre-procedure. Per I/O 95 cc output from drain in last 24H, output is feculent with gas concerning for fistula. Flushes/aspirates without issue. Tolerating diet/passing stool. Culture of aspirate pending, currently receiving Rocephin + Flagyl  per primary team.  Continue TID flushes with 5 cc NS, record output Qshift, dressing changes QD or PRN if soiled, call IR if difficulty flushing or sudden decrease in output.   Plan for repeat CT/possible injection once output <10 mL/QD (excluding flush) - if patient is to be discharged prior to this occurring they will need to follow up with IR clinic as an outpatient typically 10-14 days post d/c, IR scheduler will call patient with date/time. Upon d/c drain to be flushed QD with 5 cc NS (will need rx from primary team at d/c), record output QD, dressing changes every 2-3 days or sooner if soiled.   IR will continue to follow - please call with questions or concerns.  Electronically Signed: Joaquim Nam, PA-C 06/17/2019, 1:27 PM   I spent a total of 15 Minutes at the the patient's bedside AND on the patient's hospital floor or unit, greater than 50% of which was counseling/coordinating care for RLQ drain follow up.

## 2019-06-17 NOTE — Progress Notes (Signed)
Patient came back from dialysis. Alert and oriented x 4; complaining of abdominal pain (Dilaudid was administered); VS stable. Will continue to monitor.

## 2019-06-17 NOTE — Progress Notes (Signed)
[name] for Infectious Disease  Date of Admission:  06/11/2019     Total days of antibiotics 6         ASSESSMENT:  [name] completed hemodialysis today for acute kidney injury. [name] continues to have abdominal pain associated with her diverticulitis and abscess. IR follow percutaneous drain with plans for repeat CT scan when output is <10cc/day (excluding flushes). Blood culture remains with yet identified gram variable rod. Will continue with ceftriaxone and metronidazole.   PLAN:  1. Continue ceftriaxone and metronidazole. 2. Percutaneous drain management per IR. 3. Monitor cultures for identification of gram variable rod 4. Acute kidney injury management per nephrology.   Principal Problem:   Acute diverticulitis Active Problems:   Obesity (BMI 30-39.9)   ILD (interstitial lung disease) ([place])   Hypertension   Acute kidney injury (AKI) with acute tubular necrosis (ATN) ([place])   . buPROPion  450 mg Oral Daily  . Chlorhexidine Gluconate Cloth  6 each Topical Daily  . citalopram  40 mg Oral Daily  . enoxaparin (LOVENOX) injection  30 mg Subcutaneous Q24H  . feeding supplement (NEPRO CARB STEADY)  237 mL Oral BID BM  . insulin aspart  0-9 Units Subcutaneous Q4H  . pantoprazole (PROTONIX) IV  40 mg Intravenous Q24H  . sodium chloride flush  5 mL Intracatheter Q8H    SUBJECTIVE:  Afebrile overnight with no acute events. Received dialysis earlier today. Having abdominal pain unchanged from yesterday.   Allergies  Allergen Reactions  . Guaifenesin Nausea And Vomiting and Other (See Comments)    un  . Mucinex [Guaifenesin Er] Nausea And Vomiting  . Sulfa Antibiotics Nausea And Vomiting     Review of Systems: Review of Systems  Constitutional: Negative for chills, fever and weight loss.  Respiratory: Negative for cough, shortness of breath and wheezing.   Cardiovascular: Negative for chest pain and leg swelling.  Gastrointestinal: Positive for abdominal  pain. Negative for constipation, diarrhea, nausea and vomiting.  Skin: Negative for rash.      OBJECTIVE: Vitals:   06/17/19 0930 06/17/19 0953 06/17/19 1119 06/17/19 1218  BP: 118/83 113/66 113/76 114/81  Pulse: 81 88 89 91  Resp: 16 15 18 19   Temp:  97.8 F (36.6 C) 98.2 F (36.8 C)   TempSrc:  Oral Oral   SpO2:  96% 95% 95%  Weight:      Height:       Body mass index is 61.85 kg/m.  Physical Exam Constitutional:      General: [name] is not in acute distress.    Appearance: [name] is well-developed. [name] is obese. [name] is ill-appearing.  Cardiovascular:     Rate and Rhythm: Normal rate and regular rhythm.     Heart sounds: Normal heart sounds.  Pulmonary:     Effort: Pulmonary effort is normal.     Breath sounds: Normal breath sounds.  Abdominal:     Comments: RLQ drain with feculent material   Skin:    General: Skin is warm and dry.  Neurological:     Mental Status: [name] is alert.  Psychiatric:        Mood and Affect: Mood normal.     Lab Results Lab Results  Component Value Date   WBC 13.0 (H) 06/17/2019   HGB 9.3 (L) 06/17/2019   HCT 29.7 (L) 06/17/2019   MCV 85.3 06/17/2019   PLT 275 06/17/2019    Lab Results  Component Value Date   CREATININE 9.31 (H) 06/17/2019  BUN 91 (H) 06/17/2019   NA 134 (L) 06/17/2019   K 4.7 06/17/2019   CL 102 06/17/2019   CO2 13 (L) 06/17/2019    Lab Results  Component Value Date   ALT 22 06/17/2019   AST 29 06/17/2019   ALKPHOS 100 06/17/2019   BILITOT 0.3 06/17/2019     Microbiology: Recent Results (from the past 240 hour(s))  SARS Coronavirus 2 by RT PCR (hospital order, performed in [place] hospital lab) Nasopharyngeal Nasopharyngeal Swab     Status: None   Collection Time: 06/11/19  6:16 PM   Specimen: Nasopharyngeal Swab  Result Value Ref Range Status   SARS Coronavirus 2 NEGATIVE NEGATIVE Final    Comment: (NOTE) SARS-CoV-2 target nucleic acids are NOT DETECTED. The SARS-CoV-2 RNA is generally  detectable in upper and lower respiratory specimens during the acute phase of infection. The lowest concentration of SARS-CoV-2 viral copies this assay can detect is 250 copies / mL. A negative result does not preclude SARS-CoV-2 infection and should not be used as the sole basis for treatment or other patient management decisions.  A negative result may occur with improper specimen collection / handling, submission of specimen other than nasopharyngeal swab, presence of viral mutation(s) within the areas targeted by this assay, and inadequate number of viral copies (<250 copies / mL). A negative result must be combined with clinical observations, patient history, and epidemiological information. Fact Sheet for Patients:   StrictlyIdeas.no Fact Sheet for Healthcare Providers: BankingDealers.co.za This test is not yet approved or cleared  by the [place] FDA and has been authorized for detection and/or diagnosis of SARS-CoV-2 by FDA under an Emergency Use Authorization (EUA).  This EUA will remain in effect (meaning this test can be used) for the duration of the COVID-19 declaration under Section 564(b)(1) of the Act, 21 U.S.C. section 360bbb-3(b)(1), unless the authorization is terminated or revoked sooner. Performed at [facility], [place] [address], [place], [place] [address]   Culture, blood (Routine X 2) w Reflex to ID Panel     Status: Abnormal (Preliminary result)   Collection Time: 06/11/19  8:20 PM   Specimen: BLOOD  Result Value Ref Range Status   Specimen Description BLOOD BLOOD LEFT FOREARM  Final   Special Requests   Final    BOTTLES DRAWN AEROBIC AND ANAEROBIC Blood Culture results may not be optimal due to an inadequate volume of blood received in culture bottles   Culture  Setup Time (A)  Final    GRAM VARIABLE ROD ANAEROBIC BOTTLE ONLY CRITICAL RESULT CALLED TO, READ BACK BY AND VERIFIED WITH: PHARMD J FRENS AT  [place] 06/16/19 BY L BENFIELD Performed at [facility], [place] [address] [place], [place] [address]    Culture GRAM VARIABLE ROD (A)  Final   Report Status PENDING  Incomplete  Blood Culture ID Panel (Reflexed)     Status: None   Collection Time: 06/11/19  8:20 PM  Result Value Ref Range Status   Enterococcus species NOT DETECTED NOT DETECTED Final   Listeria monocytogenes NOT DETECTED NOT DETECTED Final   Staphylococcus species NOT DETECTED NOT DETECTED Final   Staphylococcus aureus (BCID) NOT DETECTED NOT DETECTED Final   Streptococcus species NOT DETECTED NOT DETECTED Final   Streptococcus agalactiae NOT DETECTED NOT DETECTED Final   Streptococcus pneumoniae NOT DETECTED NOT DETECTED Final   Streptococcus pyogenes NOT DETECTED NOT DETECTED Final   Acinetobacter baumannii NOT DETECTED NOT DETECTED Final   Enterobacteriaceae species  NOT DETECTED NOT DETECTED Final   Enterobacter cloacae complex NOT DETECTED NOT DETECTED Final   Escherichia coli NOT DETECTED NOT DETECTED Final   Klebsiella oxytoca NOT DETECTED NOT DETECTED Final   Klebsiella pneumoniae NOT DETECTED NOT DETECTED Final   Proteus species NOT DETECTED NOT DETECTED Final   Serratia marcescens NOT DETECTED NOT DETECTED Final   Haemophilus influenzae NOT DETECTED NOT DETECTED Final   Neisseria meningitidis NOT DETECTED NOT DETECTED Final   Pseudomonas aeruginosa NOT DETECTED NOT DETECTED Final   Candida albicans NOT DETECTED NOT DETECTED Final   Candida glabrata NOT DETECTED NOT DETECTED Final   Candida krusei NOT DETECTED NOT DETECTED Final   Candida parapsilosis NOT DETECTED NOT DETECTED Final   Candida tropicalis NOT DETECTED NOT DETECTED Final    Comment: Performed at [facility], [place] [address] [place], [place] [address]  Culture, blood (Routine X 2) w Reflex to ID Panel     Status: None   Collection Time: 06/11/19 10:00 PM   Specimen: BLOOD  Result Value Ref Range Status   Specimen Description BLOOD  RIGHT HAND  Final   Special Requests   Final    BOTTLES DRAWN AEROBIC ONLY Blood Culture results may not be optimal due to an inadequate volume of blood received in culture bottles   Culture   Final    NO GROWTH 5 DAYS Performed at [facility], [place] [address] [place], [place] [address]    Report Status 06/16/2019 FINAL  Final  MRSA PCR Screening     Status: None   Collection Time: 06/15/19  9:26 AM   Specimen: Nasopharyngeal  Result Value Ref Range Status   MRSA by PCR NEGATIVE NEGATIVE Final    Comment:        The GeneXpert MRSA Assay (FDA approved for NASAL specimens only), is one component of a comprehensive MRSA colonization surveillance program. It is not intended to diagnose MRSA infection nor to guide or monitor treatment for MRSA infections. Performed at [facility], [place] [address], [place], [place] [address]      Terri Piedra, [place] [facility] Infectious Disease [facility]  06/17/2019  1:55 PM

## 2019-06-17 NOTE — Progress Notes (Signed)
Patient ID: [name], female   DOB: 1960-02-27, 59 y.o.   MRN: [id number]  [facility] Progress Note   Assessment/ Plan:   1. Acute kidney Injury: Anuric, hemodynamically mediated likely with evolution to ATN in the setting of hypotension, sepsis and contrast exposure.  On NSAID (meloxicam) prior to admission. With subtle uremic symptoms and progressive volume excess for which hemodialysis was started today via RIJ temporary dialysis catheter placed by IR. Monitor for renal recovery with labs.  2.  Sepsis secondary to perforated diverticulitis/intra-abdominal infection: Status post right lower quadrant drain placed by IR with antibiotic therapy changed to ceftriaxone/Flagyl. 3.  History of ANCA vasculitis/microscopic polyangiitis: On maintenance with CellCept and methotrexate which are currently on hold secondary to sepsis.  Previously treated with corticosteroids/PLEX and rituximab. 4.  Non-anion gap metabolic acidosis: Secondary to acute kidney injury and GI losses, previously on sodium bicarbonate- will stop this now that she is on HD. 5.  Hypotension: Blood pressures under acceptable values on supportive management, off Entresto.  Subjective:   She reports to be feeling fair and complains of severe abdominal pain.   Objective:   BP (!) 126/54   Pulse 78   Temp 98.6 F (37 C) (Oral)   Resp 19   Ht 5\' 8"  (1.727 m)   Wt (!) 184.5 kg   SpO2 (!) 88%   BMI 61.85 kg/m   Intake/Output Summary (Last 24 hours) at 06/17/2019 0802 Last data filed at 06/17/2019 0531 Gross per 24 hour  Intake 1400.45 ml  Output 145 ml  Net 1255.45 ml   Weight change:   Physical Exam: Gen: Obese woman, comfortable on HD, somewhat confused CVS: Pulse regular rhythm, normal rate, S1 and S2 normal Resp: Clear to auscultation without any distinct rales or rhonchi Abd: Soft, obese, right lower quadrant tenderness with drain in situ. Ext: 1+ lower extremity edema  Imaging: IR Fluoro Guide CV  Line Right  Result Date: 06/16/2019 INDICATION: Acute kidney injury, access for temporary dialysis EXAM: ULTRASOUND GUIDANCE FOR VASCULAR ACCESS RIGHT IJ TEMPORARY DIALYSIS CATHETER MEDICATIONS: 1% LIDOCAINE LOCAL ANESTHESIA/SEDATION: Moderate Sedation Time: None. The patient's level of consciousness and vital signs were monitored continuously by radiology nursing throughout the procedure under my direct supervision. FLUOROSCOPY TIME:  Fluoroscopy Time: 0 minutes 24 seconds (12 mGy). COMPLICATIONS: None immediate. PROCEDURE: Informed written consent was obtained from the patient after a thorough discussion of the procedural risks, benefits and alternatives. All questions were addressed. Maximal Sterile Barrier Technique was utilized including caps, mask, sterile gowns, sterile gloves, sterile drape, hand hygiene and skin antiseptic. A timeout was performed prior to the initiation of the procedure. Under sterile conditions and local anesthesia, ultrasound micropuncture access performed of the right internal jugular vein. Images obtained for documentation of the patent right internal jugular vein with ultrasound. Guidewire advanced followed by the micro dilator. Amplatz guidewire inserted. Tract dilatation performed to insert a 16 cm Mahurkar catheter. Tip SVC RA junction. Images obtained for documentation. Catheter secured Prolene sutures. Blood aspirated easily followed by saline and heparin flushes. Appropriate volume strength of heparin instilled in all 3 lumens followed by external caps. Sterile dressing applied. No immediate complication. Patient tolerated the procedure well. IMPRESSION: Successful ultrasound and fluoroscopic right IJ temporary dialysis catheter. Tip SVC RA junction. Ready for use. Electronically Signed   By: Jerilynn Mages.  Shick M.D.   On: 06/16/2019 13:20   IR US Guide Vasc Access Right  Result Date: 06/16/2019 INDICATION: Acute kidney injury, access for temporary dialysis EXAM:  ULTRASOUND GUIDANCE  FOR VASCULAR ACCESS RIGHT IJ TEMPORARY DIALYSIS CATHETER MEDICATIONS: 1% LIDOCAINE LOCAL ANESTHESIA/SEDATION: Moderate Sedation Time: None. The patient's level of consciousness and vital signs were monitored continuously by radiology nursing throughout the procedure under my direct supervision. FLUOROSCOPY TIME:  Fluoroscopy Time: 0 minutes 24 seconds (12 mGy). COMPLICATIONS: None immediate. PROCEDURE: Informed written consent was obtained from the patient after a thorough discussion of the procedural risks, benefits and alternatives. All questions were addressed. Maximal Sterile Barrier Technique was utilized including caps, mask, sterile gowns, sterile gloves, sterile drape, hand hygiene and skin antiseptic. A timeout was performed prior to the initiation of the procedure. Under sterile conditions and local anesthesia, ultrasound micropuncture access performed of the right internal jugular vein. Images obtained for documentation of the patent right internal jugular vein with ultrasound. Guidewire advanced followed by the micro dilator. Amplatz guidewire inserted. Tract dilatation performed to insert a 16 cm Mahurkar catheter. Tip SVC RA junction. Images obtained for documentation. Catheter secured Prolene sutures. Blood aspirated easily followed by saline and heparin flushes. Appropriate volume strength of heparin instilled in all 3 lumens followed by external caps. Sterile dressing applied. No immediate complication. Patient tolerated the procedure well. IMPRESSION: Successful ultrasound and fluoroscopic right IJ temporary dialysis catheter. Tip SVC RA junction. Ready for use. Electronically Signed   By: Jerilynn Mages.  Shick M.D.   On: 06/16/2019 13:20   DG CHEST PORT 1 VIEW  Result Date: 06/15/2019 CLINICAL DATA:  Expiratory wheezes this morning. EXAM: PORTABLE CHEST 1 VIEW COMPARISON:  06/11/2019 FINDINGS: Heart is mildly enlarged. There is mild perihilar infiltrate consistent with mild edema. More focal opacity in the  MEDIAL LEFT lung base may represent atelectasis or developing infiltrate. IMPRESSION: 1. Cardiomegaly and mild edema. 2. MEDIAL LEFT lower lobe atelectasis or infiltrate. Electronically Signed   By: Nolon Nations M.D.   On: 06/15/2019 11:21    Labs: BMET Recent Labs  Lab 06/11/19 1243 06/11/19 1243 06/11/19 2020 06/12/19 5462 06/13/19 0321 06/14/19 0121 06/15/19 0710 06/16/19 0419 06/17/19 0221  NA 139  --   --  140 138 135 139 136 134*  K 4.4  --   --  4.2 4.3 4.1 4.5 4.4 4.7  CL 102  --   --  107 104 102 103 104 102  CO2 25  --   --  23 20* 18* 18* 15* 13*  GLUCOSE 159*  --   --  145* 138* 101* 82 102* 86  BUN 19  --   --  19 36* 53* 73* 82* 91*  CREATININE 1.03*   < > 0.98 1.66* 4.37* 5.75* 7.53* 8.01* 9.31*  CALCIUM 8.9  --   --  8.2* 7.9* 7.4* 7.5* 7.4* 7.6*  PHOS  --   --   --   --   --  4.8* 6.7* 7.5*  --    < > = values in this interval not displayed.   CBC Recent Labs  Lab 06/11/19 1243 06/11/19 2020 06/14/19 0121 06/15/19 0710 06/16/19 0419 06/17/19 0221  WBC 15.3*   < > 20.0* 14.0* 12.2* 13.0*  NEUTROABS 13.4*  --   --  11.6* 9.4*  --   HGB 13.1   < > 9.3* 8.8* 8.9* 9.3*  HCT 43.0   < > 30.7* 29.4* 30.4* 29.7*  MCV 87.2   < > 87.7 87.8 89.4 85.3  PLT 333   < > 277 272 256 275   < > = values in this interval not displayed.  Medications:    . buPROPion  450 mg Oral Daily  . Chlorhexidine Gluconate Cloth  6 each Topical Daily  . citalopram  40 mg Oral Daily  . enoxaparin (LOVENOX) injection  30 mg Subcutaneous Q24H  . feeding supplement (NEPRO CARB STEADY)  237 mL Oral BID BM  . insulin aspart  0-9 Units Subcutaneous Q4H  . pantoprazole (PROTONIX) IV  40 mg Intravenous Q24H  . sodium bicarbonate  650 mg Oral TID  . sodium chloride flush  5 mL Intracatheter Q8H   Elmarie Shiley, MD 06/17/2019, 8:02 AM

## 2019-06-17 NOTE — Progress Notes (Signed)
Subjective: CC: RLQ abdominal pain Seen in HD. Patient reports continued crampy pain in the RLQ that is unchanged from yesterday. No n/v. Tolerating diet. Passing flatus. Last Bm 6/6.  Objective: Vital signs in last 24 hours: Temp:  [97.8 F (36.6 C)-98.6 F (37 C)] 98.6 F (37 C) (06/08 0014) Pulse Rate:  [71-84] 84 (06/08 0900) Resp:  [14-19] 18 (06/08 0900) BP: (83-134)/(40-78) 131/57 (06/08 0900) SpO2:  [88 %-96 %] 88 % (06/08 0637) Weight:  [184.5 kg] 184.5 kg (06/08 0708) Last BM Date: 06/15/19  Intake/Output from previous day: 06/07 0701 - 06/08 0700 In: 1400.5 [P.O.:980; IV Piggyback:420.5] Out: 195 [Urine:100; Drains:95] Intake/Output this shift: No intake/output data recorded.  PE: Gen: Alert, NAD HEENT: face is flushed Card: RRR Pulm: rate and effort normal KVQ:QVZDG, soft,+BS,RLQ drain with air and feculent fluid in bag, TTP around drain and in epigastrium, otherwise nontender  Lab Results:  Recent Labs    06/16/19 0419 06/17/19 0221  WBC 12.2* 13.0*  HGB 8.9* 9.3*  HCT 30.4* 29.7*  PLT 256 275   BMET Recent Labs    06/16/19 0419 06/17/19 0221  NA 136 134*  K 4.4 4.7  CL 104 102  CO2 15* 13*  GLUCOSE 102* 86  BUN 82* 91*  CREATININE 8.01* 9.31*  CALCIUM 7.4* 7.6*   PT/INR No results for input(s): LABPROT, INR in the last 72 hours. CMP     Component Value Date/Time   NA 134 (L) 06/17/2019 0221   NA 139 12/24/2017 1641   K 4.7 06/17/2019 0221   CL 102 06/17/2019 0221   CO2 13 (L) 06/17/2019 0221   GLUCOSE 86 06/17/2019 0221   BUN 91 (H) 06/17/2019 0221   BUN 15 12/24/2017 1641   CREATININE 9.31 (H) 06/17/2019 0221   CALCIUM 7.6 (L) 06/17/2019 0221   PROT 5.8 (L) 06/17/2019 0221   ALBUMIN 2.4 (L) 06/17/2019 0221   AST 29 06/17/2019 0221   ALT 22 06/17/2019 0221   ALKPHOS 100 06/17/2019 0221   BILITOT 0.3 06/17/2019 0221   GFRNONAA 4 (L) 06/17/2019 0221   GFRAA 5 (L) 06/17/2019 0221   Lipase     Component Value  Date/Time   LIPASE 15 06/11/2019 1243       Studies/Results: IR Fluoro Guide CV Line Right  Result Date: 06/16/2019 INDICATION: Acute kidney injury, access for temporary dialysis EXAM: ULTRASOUND GUIDANCE FOR VASCULAR ACCESS RIGHT IJ TEMPORARY DIALYSIS CATHETER MEDICATIONS: 1% LIDOCAINE LOCAL ANESTHESIA/SEDATION: Moderate Sedation Time: None. The patient's level of consciousness and vital signs were monitored continuously by radiology nursing throughout the procedure under my direct supervision. FLUOROSCOPY TIME:  Fluoroscopy Time: 0 minutes 24 seconds (12 mGy). COMPLICATIONS: None immediate. PROCEDURE: Informed written consent was obtained from the patient after a thorough discussion of the procedural risks, benefits and alternatives. All questions were addressed. Maximal Sterile Barrier Technique was utilized including caps, mask, sterile gowns, sterile gloves, sterile drape, hand hygiene and skin antiseptic. A timeout was performed prior to the initiation of the procedure. Under sterile conditions and local anesthesia, ultrasound micropuncture access performed of the right internal jugular vein. Images obtained for documentation of the patent right internal jugular vein with ultrasound. Guidewire advanced followed by the micro dilator. Amplatz guidewire inserted. Tract dilatation performed to insert a 16 cm Mahurkar catheter. Tip SVC RA junction. Images obtained for documentation. Catheter secured Prolene sutures. Blood aspirated easily followed by saline and heparin flushes. Appropriate volume strength of heparin instilled in all 3  lumens followed by external caps. Sterile dressing applied. No immediate complication. Patient tolerated the procedure well. IMPRESSION: Successful ultrasound and fluoroscopic right IJ temporary dialysis catheter. Tip SVC RA junction. Ready for use. Electronically Signed   By: Jerilynn Mages.  Shick M.D.   On: 06/16/2019 13:20   IR US Guide Vasc Access Right  Result Date:  06/16/2019 INDICATION: Acute kidney injury, access for temporary dialysis EXAM: ULTRASOUND GUIDANCE FOR VASCULAR ACCESS RIGHT IJ TEMPORARY DIALYSIS CATHETER MEDICATIONS: 1% LIDOCAINE LOCAL ANESTHESIA/SEDATION: Moderate Sedation Time: None. The patient's level of consciousness and vital signs were monitored continuously by radiology nursing throughout the procedure under my direct supervision. FLUOROSCOPY TIME:  Fluoroscopy Time: 0 minutes 24 seconds (12 mGy). COMPLICATIONS: None immediate. PROCEDURE: Informed written consent was obtained from the patient after a thorough discussion of the procedural risks, benefits and alternatives. All questions were addressed. Maximal Sterile Barrier Technique was utilized including caps, mask, sterile gowns, sterile gloves, sterile drape, hand hygiene and skin antiseptic. A timeout was performed prior to the initiation of the procedure. Under sterile conditions and local anesthesia, ultrasound micropuncture access performed of the right internal jugular vein. Images obtained for documentation of the patent right internal jugular vein with ultrasound. Guidewire advanced followed by the micro dilator. Amplatz guidewire inserted. Tract dilatation performed to insert a 16 cm Mahurkar catheter. Tip SVC RA junction. Images obtained for documentation. Catheter secured Prolene sutures. Blood aspirated easily followed by saline and heparin flushes. Appropriate volume strength of heparin instilled in all 3 lumens followed by external caps. Sterile dressing applied. No immediate complication. Patient tolerated the procedure well. IMPRESSION: Successful ultrasound and fluoroscopic right IJ temporary dialysis catheter. Tip SVC RA junction. Ready for use. Electronically Signed   By: Jerilynn Mages.  Shick M.D.   On: 06/16/2019 13:20    Anti-infectives: Anti-infectives (From admission, onward)   Start     Dose/Rate Route Frequency Ordered Stop   06/14/19 1330  cefTRIAXone (ROCEPHIN) 2 g in sodium  chloride 0.9 % 100 mL IVPB     2 g 200 mL/hr over 30 Minutes Intravenous Every 24 hours 06/14/19 1316     06/14/19 1330  metroNIDAZOLE (FLAGYL) IVPB 500 mg     500 mg 100 mL/hr over 60 Minutes Intravenous Every 8 hours 06/14/19 1316     06/13/19 2200  piperacillin-tazobactam (ZOSYN) IVPB 3.375 g  Status:  Discontinued     3.375 g 12.5 mL/hr over 240 Minutes Intravenous Every 12 hours 06/13/19 1111 06/14/19 1316   06/11/19 2330  piperacillin-tazobactam (ZOSYN) IVPB 3.375 g  Status:  Discontinued     3.375 g 12.5 mL/hr over 240 Minutes Intravenous Every 8 hours 06/11/19 1852 06/13/19 1111   06/11/19 1715  piperacillin-tazobactam (ZOSYN) IVPB 3.375 g     3.375 g 100 mL/hr over 30 Minutes Intravenous  Once 06/11/19 1714 06/11/19 1942       Assessment/Plan 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 Currently undergoing HD. Nephrology following Urinary Retention - Foley placed 6/4 Malnutrition - prealbumin 6.7  Acute sigmoid diverticulitis with perforationand abscess - 2nd bout,history of diverticulitis in 2014 - last colonoscopy >10 years ago - CT scan6/3showedprogression of acute sigmoid diverticulitis now with evidence of perforation, increasing fluid in the right lower quadrant with gas fluid level, no rim enhancement to suggest organized fluid collection or abscess at this time  - S/p IR drain on 6/4, cx pending?   -  Monitor WBC count. Keep on FLD with small bump in WBC count today   - Continue abx. Per ID (switched to rocephin/flagyl due to worsening renal failure)  - Continue PT, mobilize.   ID- zosyn 6/2-6/5, rocephin/flagyl 6/5>> FEN -IVF, FLD VTE -SCDs, lovenox Foley -in place Follow up -TBD   LOS: 6 days    Jillyn Ledger , [facility] 06/17/2019, 10:09  AM Please see Amion for pager number during day hours 7:00am-4:30pm

## 2019-06-17 NOTE — Procedures (Signed)
Patient seen on Hemodialysis. BP (!) 126/54   Pulse 78   Temp 98.6 F (37 C) (Oral)   Resp 19   Ht 5\' 8"  (1.727 m)   Wt (!) 184.5 kg   SpO2 (!) 88%   BMI 61.85 kg/m   QB 250, UF goal 1.5L Tolerating treatment without complaints at this time.   Elmarie Shiley MD [facility]. Office # [phone] Pager # [phone] 8:01 AM

## 2019-06-17 NOTE — Progress Notes (Signed)
PROGRESS NOTE    [name]  ZOX:[id number] DOB: 1960/12/01 DOA: 06/11/2019 PCP: Christain Sacramento, MD    Brief Narrative:  59 y.o. 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 Pt reportedly underwent D/C procedure on 5/25. Very shortly after the procedure, pt recalled abdominal "swelling" with subsequent abd pain. Pt believed herself to be constipated, thus was prescribed miralax. Pt had several BM without resolution of symptoms. Symptoms progressed and worsened, prompting visit to ED  ED Course: In the ED, pt was found to have WBC of 15k with HR of 110's with RR in the 20's. CT abd/pelvis was performed, which was notable for sigmoid diverticulitis. Pt was started on zosyn. Hospitalist consulted for consideration for admission.  Assessment & Plan:   Principal Problem:   Acute diverticulitis Active Problems:   Obesity (BMI 30-39.9)   ILD (interstitial lung disease) ([place])   Hypertension   Acute kidney injury (AKI) with acute tubular necrosis (ATN) ([place])  1. Acute sigmoid diverticulitis with sepsis present on admit 1. Pt with leukocytosis, tachycardia, tachypnea 2. Initial CT abd personally reviewed. Findings of acute sigmoid diverticulitis without perforation 3. Given immunosuppressed state, have asked ID to follow 4. Given worsening symptoms, repeat CT obtained and reviewed with Radiologist. Findings worrisome for new perforation without abscess collection 5. Consulted General Surgery who recommended IR consult for drain placement.  6. Drain placed on 6/4 with purulent material expressed 7. WBC continues to improve, down to 12 today 8. Was initially on zosyn, however now changed to rocephin with flagyl given below renal failure 9. Cont  current abx. General Surgery and ID cont to follow 2. DM2 1. Recent a1c of 7.0 2. Cont to hold oral hyperglycemic regimen while in hospital 3. Continue on SSI coverage as needed 4. Glucose trends thus far stable 3. Morbid obesity 1. Recommend diet/lifestyle modification 4. HTN 1. Stopped entresto 2. Had been on scheduled low dose IV beta blocker. 3. Given periods of low BP, will transition to PRN labetalol only and had discontinued scheduled beta blocker 4. BP remains soft, but stable 5. 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 Had been on cellcept and methotrexate. Given acute infection, will continue to hold 2. Seems to be stable at present 3. No evidence of hemoptysis at this time 6. Hx microscopic polyangiitis 1. Followed by Rheum at [facility] 2. Historically required high dosed steroids, rituximab, plasmapheresis in the past and ultimately is continued on cellcept and methotrexate 3. Given acute infection, continue to hold immunomodulators at this time 4. Recommend f/u with Rheumatologist after pt recovers from acute illness to determine if/when to resume immunosuppressive tx as outpatient 7. Acute renal failure, oliguiric 1. Suspected ATN 2. UA on presentation without blood or protein 3. Consulted Nephrology for assistance, appreciate input 4. Cr up to over 9 today with minimal urine output, no improvement despite high dose lasix and basal IVF given recently 5. Pt now s/p temp HD cath placement and initiated HD this AM 6. Cont HD per Nephrology  DVT prophylaxis: Lovenox subq Code Status: Full Family Communication: Pt in room, family currently at bedside  Status is: Inpatient  Remains inpatient appropriate because:Persistent severe electrolyte disturbances, Unsafe d/c plan, IV treatments appropriate due to intensity of illness or inability to take PO and Inpatient level of  care appropriate due to severity of illness  Dispo: The patient is from: Home               Anticipated d/c is to: Home              Anticipated d/c date is: > 3 days              Patient currently is not medically stable to d/c.  Consultants:   ID  General Surgery  IR  Nephrology  Procedures:   IR guided drain placement 6/4  Temp HD cath placed 6/7  Antimicrobials: Anti-infectives (From admission, onward)   Start     Dose/Rate Route Frequency Ordered Stop   06/14/19 1330  cefTRIAXone (ROCEPHIN) 2 g in sodium chloride 0.9 % 100 mL IVPB     2 g 200 mL/hr over 30 Minutes Intravenous Every 24 hours 06/14/19 1316     06/14/19 1330  metroNIDAZOLE (FLAGYL) IVPB 500 mg     500 mg 100 mL/hr over 60 Minutes Intravenous Every 8 hours 06/14/19 1316     06/13/19 2200  piperacillin-tazobactam (ZOSYN) IVPB 3.375 g  Status:  Discontinued     3.375 g 12.5 mL/hr over 240 Minutes Intravenous Every 12 hours 06/13/19 1111 06/14/19 1316   06/11/19 2330  piperacillin-tazobactam (ZOSYN) IVPB 3.375 g  Status:  Discontinued     3.375 g 12.5 mL/hr over 240 Minutes Intravenous Every 8 hours 06/11/19 1852 06/13/19 1111   06/11/19 1715  piperacillin-tazobactam (ZOSYN) IVPB 3.375 g     3.375 g 100 mL/hr over 30 Minutes Intravenous  Once 06/11/19 1714 06/11/19 1942      Subjective: Seen on HD. Denies sob or chest pains. Still complaining of abd discomfort  Objective: Vitals:   06/17/19 0953 06/17/19 1119 06/17/19 1218 06/17/19 1600  BP: 113/66 113/76 114/81 98/68  Pulse: 88 89 91 86  Resp: 15 18 19    Temp: 97.8 F (36.6 C) 98.2 F (36.8 C)  98.4 F (36.9 C)  TempSrc: Oral Oral  Oral  SpO2: 96% 95% 95% 94%  Weight:      Height:        Intake/Output Summary (Last 24 hours) at 06/17/2019 1637 Last data filed at 06/17/2019 1356 Gross per 24 hour  Intake 1040.45 ml  Output 1600 ml  Net -559.55 ml   Filed Weights   06/11/19 1128 06/17/19 0708  Weight: (!) 174.2 kg (!) 184.5 kg    Examination: General exam: Conversant, in no acute distress Respiratory system: normal chest  rise, clear, no audible wheezing Cardiovascular system: regular rhythm, s1-s2 Gastrointestinal system: Nondistended,generally tender, pos BS Central nervous system: No seizures, no tremors Extremities: No cyanosis, no joint deformities Skin: No rashes, no pallor Psychiatry: Affect normal // no auditory hallucinations   Data Reviewed: I have personally reviewed following labs and imaging studies  CBC: Recent Labs  Lab 06/11/19 1243 06/11/19 2020 06/13/19 0321 06/14/19 0121 06/15/19 0710 06/16/19 0419 06/17/19 0221  WBC 15.3*   < > 26.1* 20.0* 14.0* 12.2* 13.0*  NEUTROABS 13.4*  --   --   --  11.6* 9.4*  --   HGB 13.1   < > 10.3* 9.3* 8.8* 8.9* 9.3*  HCT 43.0   < > 34.8* 30.7* 29.4* 30.4* 29.7*  MCV 87.2   < > 89.2 87.7 87.8 89.4 85.3  PLT 333   < > 287 277 272 256 275   < > = values in this interval not displayed.   Basic Metabolic  Panel: Recent Labs  Lab 06/13/19 0321 06/14/19 0121 06/15/19 0710 06/16/19 0419 06/17/19 0221  NA 138 135 139 136 134*  K 4.3 4.1 4.5 4.4 4.7  CL 104 102 103 104 102  CO2 20* 18* 18* 15* 13*  GLUCOSE 138* 101* 82 102* 86  BUN 36* 53* 73* 82* 91*  CREATININE 4.37* 5.75* 7.53* 8.01* 9.31*  CALCIUM 7.9* 7.4* 7.5* 7.4* 7.6*  PHOS  --  4.8* 6.7* 7.5*  --    GFR: Estimated Creatinine Clearance: 11.7 mL/min (A) (by C-G formula based on SCr of 9.31 mg/dL (H)). Liver Function Tests: Recent Labs  Lab 06/11/19 1243 06/11/19 1243 06/12/19 0209 06/12/19 0209 06/13/19 0321 06/14/19 0121 06/15/19 0710 06/16/19 0419 06/17/19 0221  AST 14*  --  14*  --  21  --   --   --  29  ALT 15  --  12  --  15  --   --   --  22  ALKPHOS 107  --  89  --  94  --   --   --  100  BILITOT 0.8  --  1.4*  --  1.1  --   --   --  0.3  PROT 6.9  --  5.9*  --  6.4*  --   --   --  5.8*  ALBUMIN 3.5   < > 2.9*   < > 2.9* 2.5* 2.3* 2.3* 2.4*   < > = values in this interval not displayed.   Recent Labs  Lab 06/11/19 1243  LIPASE 15   No results for input(s):  AMMONIA in the last 168 hours. Coagulation Profile: Recent Labs  Lab 06/13/19 1015  INR 1.3*   Cardiac Enzymes: No results for input(s): CKTOTAL, CKMB, CKMBINDEX, TROPONINI in the last 168 hours. BNP (last 3 results) No results for input(s): PROBNP in the last 8760 hours. HbA1C: No results for input(s): HGBA1C in the last 72 hours. CBG: Recent Labs  Lab 06/16/19 1604 06/16/19 2023 06/17/19 0018 06/17/19 0457 06/17/19 1217  GLUCAP 129* 131* 107* 99 94   Lipid Profile: No results for input(s): CHOL, HDL, LDLCALC, TRIG, CHOLHDL, LDLDIRECT in the last 72 hours. Thyroid Function Tests: No results for input(s): TSH, T4TOTAL, FREET4, T3FREE, THYROIDAB in the last 72 hours. Anemia Panel: No results for input(s): VITAMINB12, FOLATE, FERRITIN, TIBC, IRON, RETICCTPCT in the last 72 hours. Sepsis Labs: No results for input(s): PROCALCITON, LATICACIDVEN in the last 168 hours.  Recent Results (from the past 240 hour(s))  SARS Coronavirus 2 by RT PCR (hospital order, performed in [facility] hospital lab) Nasopharyngeal Nasopharyngeal Swab     Status: None   Collection Time: 06/11/19  6:16 PM   Specimen: Nasopharyngeal Swab  Result Value Ref Range Status   SARS Coronavirus 2 NEGATIVE NEGATIVE Final    Comment: (NOTE) SARS-CoV-2 target nucleic acids are NOT DETECTED. The SARS-CoV-2 RNA is generally detectable in upper and lower respiratory specimens during the acute phase of infection. The lowest concentration of SARS-CoV-2 viral copies this assay can detect is 250 copies / mL. A negative result does not preclude SARS-CoV-2 infection and should not be used as the sole basis for treatment or other patient management decisions.  A negative result may occur with improper specimen collection / handling, submission of specimen other than nasopharyngeal swab, presence of viral mutation(s) within the areas targeted by this assay, and inadequate number of viral copies (<250 copies / mL). A  negative result must be combined  with clinical observations, patient history, and epidemiological information. Fact Sheet for Patients:   StrictlyIdeas.no Fact Sheet for Healthcare Providers: BankingDealers.co.za This test is not yet approved or cleared  by the [place] FDA and has been authorized for detection and/or diagnosis of SARS-CoV-2 by FDA under an Emergency Use Authorization (EUA).  This EUA will remain in effect (meaning this test can be used) for the duration of the COVID-19 declaration under Section 564(b)(1) of the Act, 21 U.S.C. section 360bbb-3(b)(1), unless the authorization is terminated or revoked sooner. Performed at [facility], [place] [address] [place], [place] [address]   Culture, blood (Routine X 2) w Reflex to ID Panel     Status: Abnormal (Preliminary result)   Collection Time: 06/11/19  8:20 PM   Specimen: BLOOD  Result Value Ref Range Status   Specimen Description BLOOD BLOOD LEFT FOREARM  Final   Special Requests   Final    BOTTLES DRAWN AEROBIC AND ANAEROBIC Blood Culture results may not be optimal due to an inadequate volume of blood received in culture bottles   Culture  Setup Time (A)  Final    GRAM VARIABLE ROD ANAEROBIC BOTTLE ONLY CRITICAL RESULT CALLED TO, READ BACK BY AND VERIFIED WITH: PHARMD J FRENS AT [place] 06/16/19 BY L BENFIELD Performed at [facility], [place] [address] [place], [place] [address]    Culture GRAM VARIABLE ROD (A)  Final   Report Status PENDING  Incomplete  Blood Culture ID Panel (Reflexed)     Status: None   Collection Time: 06/11/19  8:20 PM  Result Value Ref Range Status   Enterococcus species NOT DETECTED NOT DETECTED Final   Listeria monocytogenes NOT DETECTED NOT DETECTED Final   Staphylococcus species NOT DETECTED NOT DETECTED Final   Staphylococcus aureus (BCID) NOT DETECTED NOT DETECTED Final   Streptococcus species NOT DETECTED NOT DETECTED Final    Streptococcus agalactiae NOT DETECTED NOT DETECTED Final   Streptococcus pneumoniae NOT DETECTED NOT DETECTED Final   Streptococcus pyogenes NOT DETECTED NOT DETECTED Final   Acinetobacter baumannii NOT DETECTED NOT DETECTED Final   Enterobacteriaceae species NOT DETECTED NOT DETECTED Final   Enterobacter cloacae complex NOT DETECTED NOT DETECTED Final   Escherichia coli NOT DETECTED NOT DETECTED Final   Klebsiella oxytoca NOT DETECTED NOT DETECTED Final   Klebsiella pneumoniae NOT DETECTED NOT DETECTED Final   Proteus species NOT DETECTED NOT DETECTED Final   Serratia marcescens NOT DETECTED NOT DETECTED Final   Haemophilus influenzae NOT DETECTED NOT DETECTED Final   Neisseria meningitidis NOT DETECTED NOT DETECTED Final   Pseudomonas aeruginosa NOT DETECTED NOT DETECTED Final   Candida albicans NOT DETECTED NOT DETECTED Final   Candida glabrata NOT DETECTED NOT DETECTED Final   Candida krusei NOT DETECTED NOT DETECTED Final   Candida parapsilosis NOT DETECTED NOT DETECTED Final   Candida tropicalis NOT DETECTED NOT DETECTED Final    Comment: Performed at [facility], [place]. [address], [place],  [address]  Culture, blood (Routine X 2) w Reflex to ID Panel     Status: None   Collection Time: 06/11/19 10:00 PM   Specimen: BLOOD  Result Value Ref Range Status   Specimen Description BLOOD RIGHT HAND  Final   Special Requests   Final    BOTTLES DRAWN AEROBIC ONLY Blood Culture results may not be optimal due to an inadequate volume of blood received in culture bottles   Culture   Final    NO GROWTH  5 DAYS Performed at [facility], [place] [address] [place], [place] [address]    Report Status 06/16/2019 FINAL  Final  MRSA PCR Screening     Status: None   Collection Time: 06/15/19  9:26 AM   Specimen: Nasopharyngeal  Result Value Ref Range Status   MRSA by PCR NEGATIVE NEGATIVE Final    Comment:        The GeneXpert MRSA Assay (FDA approved for NASAL  specimens only), is one component of a comprehensive MRSA colonization surveillance program. It is not intended to diagnose MRSA infection nor to guide or monitor treatment for MRSA infections. Performed at [facility], [place] [address], [place], [place] [address]      Radiology Studies: IR Fluoro Guide CV Line Right  Result Date: 06/16/2019 INDICATION: Acute kidney injury, access for temporary dialysis EXAM: ULTRASOUND GUIDANCE FOR VASCULAR ACCESS RIGHT IJ TEMPORARY DIALYSIS CATHETER MEDICATIONS: 1% LIDOCAINE LOCAL ANESTHESIA/SEDATION: Moderate Sedation Time: None. The patient's level of consciousness and vital signs were monitored continuously by radiology nursing throughout the procedure under my direct supervision. FLUOROSCOPY TIME:  Fluoroscopy Time: 0 minutes 24 seconds (12 mGy). COMPLICATIONS: None immediate. PROCEDURE: Informed written consent was obtained from the patient after a thorough discussion of the procedural risks, benefits and alternatives. All questions were addressed. Maximal Sterile Barrier Technique was utilized including caps, mask, sterile gowns, sterile gloves, sterile drape, hand hygiene and skin antiseptic. A timeout was performed prior to the initiation of the procedure. Under sterile conditions and local anesthesia, ultrasound micropuncture access performed of the right internal jugular vein. Images obtained for documentation of the patent right internal jugular vein with ultrasound. Guidewire advanced followed by the micro dilator. Amplatz guidewire inserted. Tract dilatation performed to insert a 16 cm Mahurkar catheter. Tip SVC RA junction. Images obtained for documentation. Catheter secured Prolene sutures. Blood aspirated easily followed by saline and heparin flushes. Appropriate volume strength of heparin instilled in all 3 lumens followed by external caps. Sterile dressing applied. No immediate complication. Patient tolerated the procedure well. IMPRESSION:  Successful ultrasound and fluoroscopic right IJ temporary dialysis catheter. Tip SVC RA junction. Ready for use. Electronically Signed   By: Jerilynn Mages.  Shick M.D.   On: 06/16/2019 13:20   IR US Guide Vasc Access Right  Result Date: 06/16/2019 INDICATION: Acute kidney injury, access for temporary dialysis EXAM: ULTRASOUND GUIDANCE FOR VASCULAR ACCESS RIGHT IJ TEMPORARY DIALYSIS CATHETER MEDICATIONS: 1% LIDOCAINE LOCAL ANESTHESIA/SEDATION: Moderate Sedation Time: None. The patient's level of consciousness and vital signs were monitored continuously by radiology nursing throughout the procedure under my direct supervision. FLUOROSCOPY TIME:  Fluoroscopy Time: 0 minutes 24 seconds (12 mGy). COMPLICATIONS: None immediate. PROCEDURE: Informed written consent was obtained from the patient after a thorough discussion of the procedural risks, benefits and alternatives. All questions were addressed. Maximal Sterile Barrier Technique was utilized including caps, mask, sterile gowns, sterile gloves, sterile drape, hand hygiene and skin antiseptic. A timeout was performed prior to the initiation of the procedure. Under sterile conditions and local anesthesia, ultrasound micropuncture access performed of the right internal jugular vein. Images obtained for documentation of the patent right internal jugular vein with ultrasound. Guidewire advanced followed by the micro dilator. Amplatz guidewire inserted. Tract dilatation performed to insert a 16 cm Mahurkar catheter. Tip SVC RA junction. Images obtained for documentation. Catheter secured Prolene sutures. Blood aspirated easily followed by saline and heparin flushes. Appropriate volume strength of heparin instilled in all 3 lumens followed by external caps. Sterile dressing  applied. No immediate complication. Patient tolerated the procedure well. IMPRESSION: Successful ultrasound and fluoroscopic right IJ temporary dialysis catheter. Tip SVC RA junction. Ready for use. Electronically  Signed   By: Jerilynn Mages.  Shick M.D.   On: 06/16/2019 13:20    Scheduled Meds: . buPROPion  450 mg Oral Daily  . Chlorhexidine Gluconate Cloth  6 each Topical Daily  . citalopram  40 mg Oral Daily  . enoxaparin (LOVENOX) injection  30 mg Subcutaneous Q24H  . feeding supplement (NEPRO CARB STEADY)  237 mL Oral BID BM  . insulin aspart  0-9 Units Subcutaneous Q4H  . pantoprazole (PROTONIX) IV  40 mg Intravenous Q24H  . sodium chloride flush  5 mL Intracatheter Q8H   Continuous Infusions: . cefTRIAXone (ROCEPHIN)  IV 2 g (06/17/19 1257)  . metronidazole 500 mg (06/17/19 1356)  . sodium chloride Stopped (06/12/19 0949)     LOS: 6 days   Marylu Lund, MD [facility] Pager On Amion  If 7PM-7AM, please contact night-coverage 06/17/2019, 4:37 PM

## 2019-06-17 NOTE — Progress Notes (Signed)
Occupational Therapy Evaluation Patient Details Name: [name] MRN: [id number] DOB: 12/16/1960 Today's Date: 06/17/2019    History of Present Illness 59 y.o. 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 She presented to the ED with complaints of marked lower quadrant pain. Pt admitted for sigmoid diverticulitis with perforation and abscess. Drain placed 6/4.   Clinical Impression   Patient lives with husband in a mobile home.  At baseline she is able to walk without devices  And was independent with ADLs, though says getting in and out of shower has caused difficulty.   Saw patient after dialysis today and she was very fatigued and agitated.  Attempted to sit EOB with mod assist though patient stating her R abdomen causing to much pain and declined further attempts.  Requiring min assist with UB ADLs at bed level, and max with LB ADLs.  According to PT eval, patient able to stand with min assist x2.  Will further eval functional mobility and seated ADLs at next visit.   Discussed possibility of needing SNF as patient is weak and would not be safe at home.  Will continue to follow with OT acutely to address the deficits listed below.    Follow Up Recommendations  SNF;Supervision - Intermittent(may be able to progress to [facility])    Equipment Recommendations  3 in 1 bedside commode    Recommendations for Other Services       Precautions / Restrictions Precautions Precautions: Fall;Other (comment) Precaution Comments: check BP (hypotensive) Restrictions Weight Bearing Restrictions: No      Mobility Bed Mobility Overal bed mobility: Needs Assistance Bed Mobility: Rolling Rolling: Mod assist            Transfers                 General transfer comment: patient refusing attempt    Balance Overall balance assessment: Needs assistance                                          ADL either performed or assessed with clinical judgement   ADL Overall ADL's : Needs assistance/impaired Eating/Feeding: Set up;Bed level   Grooming: Set up;Bed level   Upper Body Bathing: Set up;Bed level   Lower Body Bathing: Maximal assistance;Bed level   Upper Body Dressing : Minimal assistance;Bed level   Lower Body Dressing: Maximal assistance;Bed level               Functional mobility during ADLs: Moderate assistance(bed mob) General ADL Comments: Patient refusing EOB during session.  According to PT eval yesterday, patient able to sit EOB min assist x2, but not able to assess at this time.  Will further eval at next visit      Vision         Perception     Praxis      Pertinent Vitals/Pain Pain Assessment: Faces Faces Pain Scale: Hurts even more Pain Location: abdomen (R lower quadrant) Pain Descriptors / Indicators: Grimacing;Guarding;Discomfort Pain Intervention(s): Limited activity within patient's tolerance;Monitored during session;Repositioned     Hand Dominance Right   Extremity/Trunk Assessment Upper Extremity Assessment Upper Extremity Assessment: Generalized weakness           Communication Communication Communication: No difficulties   Cognition Arousal/Alertness: Awake/alert Behavior During Therapy: Agitated;WFL for  tasks assessed/performed Overall Cognitive Status: No family/caregiver present to determine baseline cognitive functioning                                 General Comments: Patient a little agitated during session.  Not wanting to particpate or answer questions.  Had poor attention.   General Comments  Patient just in from dialysis, feeling fatigued and agitated, not wanting to participate.    Exercises     Shoulder Instructions      Home Living Family/patient expects to be discharged to:: Private residence Living Arrangements: Spouse/significant  other;Other relatives Available Help at Discharge: Family;Available 24 hours/day Type of Home: Mobile home Home Access: Ramped entrance     Home Layout: One level     Bathroom Shower/Tub: Teacher, early years/pre: Standard     Home Equipment: Walker - 2 wheels          Prior Functioning/Environment Level of Independence: Independent        Comments: Drives. Grocery shops. difficulty getting in the shower.         OT Problem List: Decreased strength;Decreased activity tolerance;Impaired balance (sitting and/or standing);Decreased cognition;Obesity;Pain      OT Treatment/Interventions: Self-care/ADL training;Therapeutic exercise;Energy conservation;DME and/or AE instruction;Therapeutic activities;Cognitive remediation/compensation;Patient/family education;Balance training    OT Goals(Current goals can be found in the care plan section) Acute Rehab OT Goals Patient Stated Goal: home OT Goal Formulation: With patient Time For Goal Achievement: 07/01/19 Potential to Achieve Goals: Good  OT Frequency: Min 2X/week   Barriers to D/C: Decreased caregiver support          Co-evaluation              AM-PAC OT "6 Clicks" Daily Activity     Outcome Measure Help from another person eating meals?: A Little Help from another person taking care of personal grooming?: A Little Help from another person toileting, which includes using toliet, bedpan, or urinal?: A Lot Help from another person bathing (including washing, rinsing, drying)?: A Lot Help from another person to put on and taking off regular upper body clothing?: A Little Help from another person to put on and taking off regular lower body clothing?: A Lot 6 Click Score: 15   End of Session Nurse Communication: Mobility status  Activity Tolerance: Patient limited by pain;Patient limited by fatigue Patient left: in bed;with call bell/phone within reach  OT Visit Diagnosis: Muscle weakness  (generalized) (M62.81);Other symptoms and signs involving cognitive function;Pain Pain - Right/Left: Right Pain - part of body: (abdomen)                Time: 1315-1330 OT Time Calculation (min): 15 min Charges:  OT General Charges $OT Visit: 1 Visit OT Evaluation $OT Eval Moderate Complexity: 1 [address] OTR/L  Phylliss Bob 06/17/2019, 2:00 PM

## 2019-06-18 LAB — GLUCOSE, CAPILLARY
Glucose-Capillary: 109 mg/dL — ABNORMAL HIGH (ref 70–99)
Glucose-Capillary: 117 mg/dL — ABNORMAL HIGH (ref 70–99)
Glucose-Capillary: 151 mg/dL — ABNORMAL HIGH (ref 70–99)

## 2019-06-18 LAB — CBC
HCT: 30.5 % — ABNORMAL LOW (ref 36.0–46.0)
Hemoglobin: 9.5 g/dL — ABNORMAL LOW (ref 12.0–15.0)
MCH: 26.4 pg (ref 26.0–34.0)
MCHC: 31.1 g/dL (ref 30.0–36.0)
MCV: 84.7 fL (ref 80.0–100.0)
Platelets: 280 10*3/uL (ref 150–400)
RBC: 3.6 MIL/uL — ABNORMAL LOW (ref 3.87–5.11)
RDW: 15 % (ref 11.5–15.5)
WBC: 13.2 10*3/uL — ABNORMAL HIGH (ref 4.0–10.5)
nRBC: 0 % (ref 0.0–0.2)

## 2019-06-18 LAB — COMPREHENSIVE METABOLIC PANEL
ALT: 22 U/L (ref 0–44)
AST: 38 U/L (ref 15–41)
Albumin: 2.2 g/dL — ABNORMAL LOW (ref 3.5–5.0)
Alkaline Phosphatase: 102 U/L (ref 38–126)
Anion gap: 14 (ref 5–15)
BUN: 62 mg/dL — ABNORMAL HIGH (ref 6–20)
CO2: 19 mmol/L — ABNORMAL LOW (ref 22–32)
Calcium: 7.8 mg/dL — ABNORMAL LOW (ref 8.9–10.3)
Chloride: 101 mmol/L (ref 98–111)
Creatinine, Ser: 7.63 mg/dL — ABNORMAL HIGH (ref 0.44–1.00)
GFR calc Af Amer: 6 mL/min — ABNORMAL LOW (ref >60)
GFR calc non Af Amer: 5 mL/min — ABNORMAL LOW (ref >60)
Glucose, Bld: 109 mg/dL — ABNORMAL HIGH (ref 70–99)
Potassium: 3.6 mmol/L (ref 3.5–5.1)
Sodium: 134 mmol/L — ABNORMAL LOW (ref 135–145)
Total Bilirubin: 0.5 mg/dL (ref 0.3–1.2)
Total Protein: 5.7 g/dL — ABNORMAL LOW (ref 6.5–8.1)

## 2019-06-18 LAB — MAGNESIUM: Magnesium: 2.1 mg/dL (ref 1.7–2.4)

## 2019-06-18 MED ORDER — LABETALOL HCL 5 MG/ML IV SOLN: 20.0000 mg | INTRAVENOUS | Status: DC | PRN

## 2019-06-18 MED ORDER — HEPARIN SODIUM (PORCINE) 1000 UNIT/ML IJ SOLN
INTRAMUSCULAR | Status: AC
  Administered 2019-06-18: 3400 [IU]
  Filled 2019-06-18: qty 3

## 2019-06-18 MED ORDER — SACCHAROMYCES BOULARDII 250 MG PO CAPS
250.0000 mg | ORAL_CAPSULE | Freq: Two times a day (BID) | ORAL | Status: DC
  Administered 2019-06-18 – 2019-06-30 (×25): 250 mg via ORAL
  Filled 2019-06-18 (×26): qty 1

## 2019-06-18 NOTE — Progress Notes (Signed)
[facility] Progress Note     Subjective: CC-  Feels the same as yesterday. Continues to have some crampy RLQ pain and mild intermittent nausea. Tolerating diet but not eating much. Reports having a few loose stools yesterday.  Objective: Vital signs in last 24 hours: Temp:  [97.8 F (36.6 C)-98.6 F (37 C)] 97.8 F (36.6 C) (06/09 0703) Pulse Rate:  [67-91] 80 (06/09 0900) Resp:  [14-19] 15 (06/09 0703) BP: (97-132)/(58-83) 132/70 (06/09 0900) SpO2:  [93 %-97 %] 97 % (06/09 0703) Weight:  [180.7 kg] 180.7 kg (06/09 0703) Last BM Date: 06/17/19  Intake/Output from previous day: 06/08 0701 - 06/09 0700 In: 930 [P.O.:420; I.V.:5; IV Piggyback:500] Out: 8315 [Drains:55] Intake/Output this shift: No intake/output data recorded.  PE: Gen: Alert, NAD Pulm: rate and effort normal VVO:HYWVP, soft,+BS,RLQ drain withair andfeculent fluid in bag, TTP around drain, otherwise nontender  Lab Results:  Recent Labs    06/17/19 0221 06/18/19 0446  WBC 13.0* 13.2*  HGB 9.3* 9.5*  HCT 29.7* 30.5*  PLT 275 280   BMET Recent Labs    06/17/19 0221 06/18/19 0446  NA 134* 134*  K 4.7 3.6  CL 102 101  CO2 13* 19*  GLUCOSE 86 109*  BUN 91* 62*  CREATININE 9.31* 7.63*  CALCIUM 7.6* 7.8*   PT/INR No results for input(s): LABPROT, INR in the last 72 hours. CMP     Component Value Date/Time   NA 134 (L) 06/18/2019 0446   NA 139 12/24/2017 1641   K 3.6 06/18/2019 0446   CL 101 06/18/2019 0446   CO2 19 (L) 06/18/2019 0446   GLUCOSE 109 (H) 06/18/2019 0446   BUN 62 (H) 06/18/2019 0446   BUN 15 12/24/2017 1641   CREATININE 7.63 (H) 06/18/2019 0446   CALCIUM 7.8 (L) 06/18/2019 0446   PROT 5.7 (L) 06/18/2019 0446   ALBUMIN 2.2 (L) 06/18/2019 0446   AST 38 06/18/2019 0446   ALT 22 06/18/2019 0446   ALKPHOS 102 06/18/2019 0446   BILITOT 0.5 06/18/2019 0446   GFRNONAA 5 (L) 06/18/2019 0446   GFRAA 6 (L) 06/18/2019 0446   Lipase     Component Value  Date/Time   LIPASE 15 06/11/2019 1243       Studies/Results: IR Fluoro Guide CV Line Right  Result Date: 06/16/2019 INDICATION: Acute kidney injury, access for temporary dialysis EXAM: ULTRASOUND GUIDANCE FOR VASCULAR ACCESS RIGHT IJ TEMPORARY DIALYSIS CATHETER MEDICATIONS: 1% LIDOCAINE LOCAL ANESTHESIA/SEDATION: Moderate Sedation Time: None. The patient's level of consciousness and vital signs were monitored continuously by radiology nursing throughout the procedure under my direct supervision. FLUOROSCOPY TIME:  Fluoroscopy Time: 0 minutes 24 seconds (12 mGy). COMPLICATIONS: None immediate. PROCEDURE: Informed written consent was obtained from the patient after a thorough discussion of the procedural risks, benefits and alternatives. All questions were addressed. Maximal Sterile Barrier Technique was utilized including caps, mask, sterile gowns, sterile gloves, sterile drape, hand hygiene and skin antiseptic. A timeout was performed prior to the initiation of the procedure. Under sterile conditions and local anesthesia, ultrasound micropuncture access performed of the right internal jugular vein. Images obtained for documentation of the patent right internal jugular vein with ultrasound. Guidewire advanced followed by the micro dilator. Amplatz guidewire inserted. Tract dilatation performed to insert a 16 cm Mahurkar catheter. Tip SVC RA junction. Images obtained for documentation. Catheter secured Prolene sutures. Blood aspirated easily followed by saline and heparin flushes. Appropriate volume strength of heparin instilled in all 3 lumens followed by external  caps. Sterile dressing applied. No immediate complication. Patient tolerated the procedure well. IMPRESSION: Successful ultrasound and fluoroscopic right IJ temporary dialysis catheter. Tip SVC RA junction. Ready for use. Electronically Signed   By: Jerilynn Mages.  Shick M.D.   On: 06/16/2019 13:20   IR US Guide Vasc Access Right  Result Date:  06/16/2019 INDICATION: Acute kidney injury, access for temporary dialysis EXAM: ULTRASOUND GUIDANCE FOR VASCULAR ACCESS RIGHT IJ TEMPORARY DIALYSIS CATHETER MEDICATIONS: 1% LIDOCAINE LOCAL ANESTHESIA/SEDATION: Moderate Sedation Time: None. The patient's level of consciousness and vital signs were monitored continuously by radiology nursing throughout the procedure under my direct supervision. FLUOROSCOPY TIME:  Fluoroscopy Time: 0 minutes 24 seconds (12 mGy). COMPLICATIONS: None immediate. PROCEDURE: Informed written consent was obtained from the patient after a thorough discussion of the procedural risks, benefits and alternatives. All questions were addressed. Maximal Sterile Barrier Technique was utilized including caps, mask, sterile gowns, sterile gloves, sterile drape, hand hygiene and skin antiseptic. A timeout was performed prior to the initiation of the procedure. Under sterile conditions and local anesthesia, ultrasound micropuncture access performed of the right internal jugular vein. Images obtained for documentation of the patent right internal jugular vein with ultrasound. Guidewire advanced followed by the micro dilator. Amplatz guidewire inserted. Tract dilatation performed to insert a 16 cm Mahurkar catheter. Tip SVC RA junction. Images obtained for documentation. Catheter secured Prolene sutures. Blood aspirated easily followed by saline and heparin flushes. Appropriate volume strength of heparin instilled in all 3 lumens followed by external caps. Sterile dressing applied. No immediate complication. Patient tolerated the procedure well. IMPRESSION: Successful ultrasound and fluoroscopic right IJ temporary dialysis catheter. Tip SVC RA junction. Ready for use. Electronically Signed   By: Jerilynn Mages.  Shick M.D.   On: 06/16/2019 13:20    Anti-infectives: Anti-infectives (From admission, onward)   Start     Dose/Rate Route Frequency Ordered Stop   06/14/19 1330  cefTRIAXone (ROCEPHIN) 2 g in sodium  chloride 0.9 % 100 mL IVPB     2 g 200 mL/hr over 30 Minutes Intravenous Every 24 hours 06/14/19 1316     06/14/19 1330  metroNIDAZOLE (FLAGYL) IVPB 500 mg     500 mg 100 mL/hr over 60 Minutes Intravenous Every 8 hours 06/14/19 1316     06/13/19 2200  piperacillin-tazobactam (ZOSYN) IVPB 3.375 g  Status:  Discontinued     3.375 g 12.5 mL/hr over 240 Minutes Intravenous Every 12 hours 06/13/19 1111 06/14/19 1316   06/11/19 2330  piperacillin-tazobactam (ZOSYN) IVPB 3.375 g  Status:  Discontinued     3.375 g 12.5 mL/hr over 240 Minutes Intravenous Every 8 hours 06/11/19 1852 06/13/19 1111   06/11/19 1715  piperacillin-tazobactam (ZOSYN) IVPB 3.375 g     3.375 g 100 mL/hr over 30 Minutes Intravenous  Once 06/11/19 1714 06/11/19 1942       Assessment/Plan 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 Currently undergoing HD. Nephrology following Urinary Retention - Foley placed 6/4 Malnutrition - prealbumin 6.7  Acute sigmoid diverticulitis with perforationand abscess - 2nd bout,history of diverticulitis in 2014 - last colonoscopy >10 years ago - CT scan6/3showedprogression of acute sigmoid diverticulitis now with evidence of perforation, increasing fluid in the right lower quadrant with gas fluid level, no rim enhancement to suggest organized fluid collection or abscess at this time             - S/p IR drain on 6/4,  cx pending?              - Continue abx. Per ID (switched to rocephin/flagyl due to worsening renal failure)             - Continue PT, mobilize.  ID- zosyn 6/2-6/5, rocephin/flagyl 6/5>> FEN -IVF, soft  VTE -SCDs, lovenox Foley -in place Follow up -TBD  Plan: Advance to soft diet. Slight elevation in WBC, continue antibiotics and monitor. Will consider repeat CT scan if WBC continues to  rise.   LOS: 7 days    [facility] 06/18/2019, 9:17 AM Please see Amion for pager number during day hours 7:00am-4:30pm

## 2019-06-18 NOTE — Progress Notes (Signed)
Physical Therapy Treatment Patient Details Name: [name] MRN: [id number] DOB: [birth year] Today's Date: 06/18/2019    History of Present Illness 59 y.o. 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 She presented to the ED with complaints of marked lower quadrant pain. Pt admitted for sigmoid diverticulitis with perforation and abscess. Drain placed 6/4.    PT Comments    Pt was able to stand and transfer OOB to chair.  She did not feel strong enough to progress gait with RW yet as she reports she feels weak on her HD days.  She was incontinent of stool in the bed and did not want to report it to anyone. We educated her on the risks of skin irritation and wound formation if she did not get cleaned up.  She helped in all aspects of her mobility.  PT will continue to follow acutely for safe mobility progression.    Follow Up Recommendations  Home health PT;Supervision/Assistance - 24 hour     Equipment Recommendations  None recommended by PT    Recommendations for Other Services       Precautions / Restrictions Precautions Precautions: Fall;Other (comment) Precaution Comments: check BP (hypotensive)    Mobility  Bed Mobility Overal bed mobility: Needs Assistance Bed Mobility: Rolling;Supine to Sit Rolling: Mod assist   Supine to sit: Mod assist     General bed mobility comments: Mod assist to help maneuver trunk into sidelying for pericare, mod hand held assist to help pt pull up to sitting EOB.    Transfers Overall transfer level: Needs assistance Equipment used: Rolling walker (2 wheeled) Transfers: Sit to/from [facility] Sit to Stand: Mod assist;+2 physical assistance Stand pivot transfers: Min assist;+2 safety/equipment       General transfer comment: Mod assist and significant momentum used to come to standing, min  assist once standing to take pivotal steps to the recliner chair.    Ambulation/Gait                 Stairs             Wheelchair Mobility    Modified Rankin (Stroke Patients Only)       Balance Overall balance assessment: Needs assistance Sitting-balance support: No upper extremity supported;Feet supported Sitting balance-Leahy Scale: Fair     Standing balance support: Bilateral upper extremity supported Standing balance-Leahy Scale: Poor Standing balance comment: reliant on external support; Static stand with RW at bedside.                            Cognition Arousal/Alertness: Awake/alert Behavior During Therapy: WFL for tasks assessed/performed Overall Cognitive Status: No family/caregiver present to determine baseline cognitive functioning                                 General Comments: Unsure of her baseline.  Needed reminders to call out for basic needs (ie clean up after she is aware she had a BM).       Exercises      General Comments        Pertinent Vitals/Pain Pain Assessment: Faces Faces Pain Scale: Hurts whole lot Pain Location: abdomen (R lower quadrant) Pain Descriptors / Indicators: Cramping;Sharp Pain Intervention(s): Limited activity within patient's tolerance;Monitored during session;Repositioned    Home  Living                      Prior Function            PT Goals (current goals can now be found in the care plan section) Acute Rehab PT Goals Patient Stated Goal: home Progress towards PT goals: Progressing toward goals    Frequency    Min 3X/week      PT Plan Current plan remains appropriate    Co-evaluation              AM-PAC PT "6 Clicks" Mobility   Outcome Measure  Help needed turning from your back to your side while in a flat bed without using bedrails?: A Lot Help needed moving from lying on your back to sitting on the side of a flat bed without using  bedrails?: A Lot Help needed moving to and from a bed to a chair (including a wheelchair)?: A Little Help needed standing up from a chair using your arms (e.g., wheelchair or bedside chair)?: A Lot Help needed to walk in hospital room?: A Lot Help needed climbing 3-5 steps with a railing? : Total 6 Click Score: 12    End of Session   Activity Tolerance: Patient limited by pain Patient left: in chair;with call bell/phone within reach Nurse Communication: Mobility status PT Visit Diagnosis: Muscle weakness (generalized) (M62.81);Difficulty in walking, not elsewhere classified (R26.2)     Time: 1660-6301 PT Time Calculation (min) (ACUTE ONLY): 33 min  Charges:  $Therapeutic Activity: 23-37 mins                     Verdene Lennert, PT, DPT  Acute Rehabilitation [phone] pager [phone]) [phone] office

## 2019-06-18 NOTE — Progress Notes (Signed)
[facility]  Date of Admission:  06/11/2019     Total days of antibiotics 8         ASSESSMENT:  [name] continues to receive ceftriaxone and metronidazole for diverticulitis with perforation and abscess s/p percutaneous drain placement. Drain output slowly decreasing now down to 55. Currently on Day 7 of antibiotic therapy with IV medication. Will continue IV therapy through at least 10 days pending achievement of source control. With concern for fistula may need to continue therapy. Crampy abdominal pain remains an issue and she has concerns that her weight is contributing to her situation.   PLAN:  1. Continue ceftriaxone and metronidazole. 2. Percutaneous drain management per IR. 3. Awaiting identification of gram variable rods from 6/2 blood culture.  Principal Problem:   Acute diverticulitis Active Problems:   Obesity (BMI 30-39.9)   ILD (interstitial lung disease) ([place])   Hypertension   Acute kidney injury (AKI) with acute tubular necrosis (ATN) ([place])   . buPROPion  450 mg Oral Daily  . Chlorhexidine Gluconate Cloth  6 each Topical Daily  . citalopram  40 mg Oral Daily  . enoxaparin (LOVENOX) injection  30 mg Subcutaneous Q24H  . feeding supplement (NEPRO CARB STEADY)  237 mL Oral BID BM  . insulin aspart  0-9 Units Subcutaneous Q4H  . pantoprazole (PROTONIX) IV  40 mg Intravenous Q24H  . saccharomyces boulardii  250 mg Oral BID  . sodium chloride flush  5 mL Intracatheter Q8H    SUBJECTIVE:  Afebrile overnight with no acute events. Continues to have same crampy abdominal pain as yesterday. Denies fevers, chills, or sweats.   Allergies  Allergen Reactions  . Guaifenesin Nausea And Vomiting and Other (See Comments)    un  . Mucinex [Guaifenesin Er] Nausea And Vomiting  . Sulfa Antibiotics Nausea And Vomiting     Review of Systems: Review of Systems  Constitutional: Negative for chills, fever and weight loss.  Respiratory:  Negative for cough, shortness of breath and wheezing.   Cardiovascular: Negative for chest pain and leg swelling.  Gastrointestinal: Negative for abdominal pain, constipation, diarrhea, nausea and vomiting.  Skin: Negative for rash.      OBJECTIVE: Vitals:   06/18/19 0900 06/18/19 0930 06/18/19 1000 06/18/19 1014  BP: 132/70 (!) 159/105 113/84 123/67  Pulse: 80 92 74 82  Resp:    19  Temp:    98.4 F (36.9 C)  TempSrc:    Oral  SpO2:    93%  Weight:    (!) 179.5 kg  Height:       Body mass index is 60.17 kg/m.  Physical Exam Constitutional:      General: She is not in acute distress.    Appearance: She is well-developed.     Comments: Lying in bed with head of bed elevated; pleasant.   Cardiovascular:     Rate and Rhythm: Normal rate and regular rhythm.     Heart sounds: Normal heart sounds.  Pulmonary:     Effort: Pulmonary effort is normal.     Breath sounds: Normal breath sounds.  Abdominal:     Tenderness: There is abdominal tenderness (around drain; feculent material present).  Skin:    General: Skin is warm and dry.  Neurological:     Mental Status: She is alert and oriented to person, place, and time.  Psychiatric:        Behavior: Behavior normal.        Thought  Content: Thought content normal.        Judgment: Judgment normal.     Lab Results Lab Results  Component Value Date   WBC 13.2 (H) 06/18/2019   HGB 9.5 (L) 06/18/2019   HCT 30.5 (L) 06/18/2019   MCV 84.7 06/18/2019   PLT 280 06/18/2019    Lab Results  Component Value Date   CREATININE 7.63 (H) 06/18/2019   BUN 62 (H) 06/18/2019   NA 134 (L) 06/18/2019   K 3.6 06/18/2019   CL 101 06/18/2019   CO2 19 (L) 06/18/2019    Lab Results  Component Value Date   ALT 22 06/18/2019   AST 38 06/18/2019   ALKPHOS 102 06/18/2019   BILITOT 0.5 06/18/2019     Microbiology: Recent Results (from the past 240 hour(s))  SARS Coronavirus 2 by RT PCR (hospital order, performed in [facility]  hospital lab) Nasopharyngeal Nasopharyngeal Swab     Status: None   Collection Time: 06/11/19  6:16 PM   Specimen: Nasopharyngeal Swab  Result Value Ref Range Status   SARS Coronavirus 2 NEGATIVE NEGATIVE Final    Comment: (NOTE) SARS-CoV-2 target nucleic acids are NOT DETECTED. The SARS-CoV-2 RNA is generally detectable in upper and lower respiratory specimens during the acute phase of infection. The lowest concentration of SARS-CoV-2 viral copies this assay can detect is 250 copies / mL. A negative result does not preclude SARS-CoV-2 infection and should not be used as the sole basis for treatment or other patient management decisions.  A negative result may occur with improper specimen collection / handling, submission of specimen other than nasopharyngeal swab, presence of viral mutation(s) within the areas targeted by this assay, and inadequate number of viral copies (<250 copies / mL). A negative result must be combined with clinical observations, patient history, and epidemiological information. Fact Sheet for Patients:   StrictlyIdeas.no Fact Sheet for Healthcare Providers: BankingDealers.co.za This test is not yet approved or cleared  by the [place] FDA and has been authorized for detection and/or diagnosis of SARS-CoV-2 by FDA under an Emergency Use Authorization (EUA).  This EUA will remain in effect (meaning this test can be used) for the duration of the COVID-19 declaration under Section 564(b)(1) of the Act, 21 U.S.C. section 360bbb-3(b)(1), unless the authorization is terminated or revoked sooner. Performed at [facility], [place] [address] [place], [place] [address]   Culture, blood (Routine X 2) w Reflex to ID Panel     Status: Abnormal (Preliminary result)   Collection Time: 06/11/19  8:20 PM   Specimen: BLOOD  Result Value Ref Range Status   Specimen Description BLOOD BLOOD LEFT FOREARM  Final   Special  Requests   Final    BOTTLES DRAWN AEROBIC AND ANAEROBIC Blood Culture results may not be optimal due to an inadequate volume of blood received in culture bottles   Culture  Setup Time (A)  Final    GRAM VARIABLE ROD ANAEROBIC BOTTLE ONLY CRITICAL RESULT CALLED TO, READ BACK BY AND VERIFIED WITH: PHARMD J FRENS AT 0815 06/16/19 BY L BENFIELD    Culture (A)  Final    GRAM VARIABLE ROD IDENTIFICATION TO FOLLOW Performed at [facility], [place] [address] [place], [place] [address]    Report Status PENDING  Incomplete  Blood Culture ID Panel (Reflexed)     Status: None   Collection Time: 06/11/19  8:20 PM  Result Value Ref Range Status   Enterococcus species NOT DETECTED NOT DETECTED  Final   Listeria monocytogenes NOT DETECTED NOT DETECTED Final   Staphylococcus species NOT DETECTED NOT DETECTED Final   Staphylococcus aureus (BCID) NOT DETECTED NOT DETECTED Final   Streptococcus species NOT DETECTED NOT DETECTED Final   Streptococcus agalactiae NOT DETECTED NOT DETECTED Final   Streptococcus pneumoniae NOT DETECTED NOT DETECTED Final   Streptococcus pyogenes NOT DETECTED NOT DETECTED Final   Acinetobacter baumannii NOT DETECTED NOT DETECTED Final   Enterobacteriaceae species NOT DETECTED NOT DETECTED Final   Enterobacter cloacae complex NOT DETECTED NOT DETECTED Final   Escherichia coli NOT DETECTED NOT DETECTED Final   Klebsiella oxytoca NOT DETECTED NOT DETECTED Final   Klebsiella pneumoniae NOT DETECTED NOT DETECTED Final   Proteus species NOT DETECTED NOT DETECTED Final   Serratia marcescens NOT DETECTED NOT DETECTED Final   Haemophilus influenzae NOT DETECTED NOT DETECTED Final   Neisseria meningitidis NOT DETECTED NOT DETECTED Final   Pseudomonas aeruginosa NOT DETECTED NOT DETECTED Final   Candida albicans NOT DETECTED NOT DETECTED Final   Candida glabrata NOT DETECTED NOT DETECTED Final   Candida krusei NOT DETECTED NOT DETECTED Final   Candida parapsilosis NOT DETECTED  NOT DETECTED Final   Candida tropicalis NOT DETECTED NOT DETECTED Final    Comment: Performed at [facility], [place] [address], [place], [place] [address]  Culture, blood (Routine X 2) w Reflex to ID Panel     Status: None   Collection Time: 06/11/19 10:00 PM   Specimen: BLOOD  Result Value Ref Range Status   Specimen Description BLOOD RIGHT HAND  Final   Special Requests   Final    BOTTLES DRAWN AEROBIC ONLY Blood Culture results may not be optimal due to an inadequate volume of blood received in culture bottles   Culture   Final    NO GROWTH 5 DAYS Performed at [facility], [place] [address], [place], [place] [address]    Report Status 06/16/2019 FINAL  Final  MRSA PCR Screening     Status: None   Collection Time: 06/15/19  9:26 AM   Specimen: Nasopharyngeal  Result Value Ref Range Status   MRSA by PCR NEGATIVE NEGATIVE Final    Comment:        The GeneXpert MRSA Assay (FDA approved for NASAL specimens only), is one component of a comprehensive MRSA colonization surveillance program. It is not intended to diagnose MRSA infection nor to guide or monitor treatment for MRSA infections. Performed at [facility], [place] [address] [place], [place] [address]      Terri Piedra, [facility]  06/18/2019  10:47 AM

## 2019-06-18 NOTE — Progress Notes (Signed)
[facility]  PROGRESS NOTE  [name] TKZ:[id number] DOB: 03-Feb-1960 DOA: 06/11/2019 PCP: Christain Sacramento, MD   Brief HPI:   59 year old female with history of prior sigmoid diverticulitis with perforation in 2014, history of ILD on chronic suppression, diabetes mellitus type 2 on Metformin, obesity, hypertension came to ED with complaints of abdominal pain.  In the ED CT abdomen pelvis showed sigmoid diverticulitis.  Patient was started on IV Zosyn.  Hospitalist was consulted for admission.    Subjective   Patient seen and examined, underwent hemodialysis this morning.  Denies any complaints.   Assessment/Plan:     1. Acute sigmoid diverticulitis-patient initially presented with sigmoid vasculitis seen on CT.  As patient had worsening symptoms repeat CT was done which showed possibility of perforation without abscess collection.  General surgery was recommended who recommended IR for drain placement.  Patient had drain placement on 06/13/2019 with purulent material expressed.  WBC is down to 13,000.  She was initially on IV Zosyn however was changed to Rocephin and Flagyl.  ID and general surgery following. 2. Acute kidney injury, oliguric-suspected ATN, urine presentation showed no blood or protein.  Likely induced by hypotension, contrast-induced nephropathy and Entresto.  Nephrology was consulted.  Patient was started on hemodialysis for uremic symptoms.  Plan for repeat hemodialysis in a.m. 3. 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  She is on maintenance with CellCept and methotrexate which are currently on hold secondary to sepsis.  Previously treated with corticosteroids/Plex and rituximab. 4. Hypertension-hemoglobin A1c 7.0.  Oral upper glycemic agents on hold.  Continue sliding scale insulin with NovoLog. 5. Hypertension-Entresto has been stopped.  Patient was taking Coreg at home.  Currently she is on IV labetalol 20 mg IV  every 2 hours as needed for SBP more than 160.  Blood pressure is stable. 6. Morbid obesity    SpO2: 93 % O2 Flow Rate (L/min): 2 L/min   COVID-19 Labs  No results for input(s): DDIMER, FERRITIN, LDH, CRP in the last 72 hours.  Lab Results  Component Value Date   SARSCOV2NAA NEGATIVE 06/11/2019   [place] NEGATIVE 05/30/2019     CBG: Recent Labs  Lab 06/17/19 1217 06/17/19 1717 06/17/19 2013 06/18/19 0015 06/18/19 0425  GLUCAP 94 136* 96 109* 117*    CBC: Recent Labs  Lab 06/14/19 0121 06/15/19 0710 06/16/19 0419 06/17/19 0221 06/18/19 0446  WBC 20.0* 14.0* 12.2* 13.0* 13.2*  NEUTROABS  --  11.6* 9.4*  --   --   HGB 9.3* 8.8* 8.9* 9.3* 9.5*  HCT 30.7* 29.4* 30.4* 29.7* 30.5*  MCV 87.7 87.8 89.4 85.3 84.7  PLT 277 272 256 275 573    Basic Metabolic Panel: Recent Labs  Lab 06/14/19 0121 06/15/19 0710 06/16/19 0419 06/17/19 0221 06/18/19 0446  NA 135 139 136 134* 134*  K 4.1 4.5 4.4 4.7 3.6  CL 102 103 104 102 101  CO2 18* 18* 15* 13* 19*  GLUCOSE 101* 82 102* 86 109*  BUN 53* 73* 82* 91* 62*  CREATININE 5.75* 7.53* 8.01* 9.31* 7.63*  CALCIUM 7.4* 7.5* 7.4* 7.6* 7.8*  MG  --   --   --   --  2.1  PHOS 4.8* 6.7* 7.5*  --   --      Liver Function Tests: Recent Labs  Lab 06/12/19 0209 06/12/19 0209 06/13/19 0321 06/13/19 0321 06/14/19 0121 06/15/19 0710 06/16/19 0419 06/17/19 0221 06/18/19 0446  AST 14*  --  21  --   --   --   --  29 38  ALT 12  --  15  --   --   --   --  22 22  ALKPHOS 89  --  94  --   --   --   --  100 102  BILITOT 1.4*  --  1.1  --   --   --   --  0.3 0.5  PROT 5.9*  --  6.4*  --   --   --   --  5.8* 5.7*  ALBUMIN 2.9*   < > 2.9*   < > 2.5* 2.3* 2.3* 2.4* 2.2*   < > = values in this interval not displayed.        DVT prophylaxis: Lovenox  Code Status: Full code  Family Communication: No family at bedside    Status is: Inpatient  Dispo: The patient is from: Home              Anticipated d/c is to:  Home              Anticipated d/c date is: 06/21/2019              Patient currently admitted for acute kidney injury, sigmoid diverticulitis  Barrier to discharge-acute kidney injury, sigmoid diverticulitis, patient on dialysis        Scheduled medications:  . buPROPion  450 mg Oral Daily  . Chlorhexidine Gluconate Cloth  6 each Topical Daily  . citalopram  40 mg Oral Daily  . enoxaparin (LOVENOX) injection  30 mg Subcutaneous Q24H  . feeding supplement (NEPRO CARB STEADY)  237 mL Oral BID BM  . insulin aspart  0-9 Units Subcutaneous Q4H  . pantoprazole (PROTONIX) IV  40 mg Intravenous Q24H  . saccharomyces boulardii  250 mg Oral BID  . sodium chloride flush  5 mL Intracatheter Q8H    Consultants:  Nephrology  ID  IR  General surgery  Procedures:    Antibiotics:   Anti-infectives (From admission, onward)   Start     Dose/Rate Route Frequency Ordered Stop   06/14/19 1330  cefTRIAXone (ROCEPHIN) 2 g in sodium chloride 0.9 % 100 mL IVPB     2 g 200 mL/hr over 30 Minutes Intravenous Every 24 hours 06/14/19 1316     06/14/19 1330  metroNIDAZOLE (FLAGYL) IVPB 500 mg     500 mg 100 mL/hr over 60 Minutes Intravenous Every 8 hours 06/14/19 1316     06/13/19 2200  piperacillin-tazobactam (ZOSYN) IVPB 3.375 g  Status:  Discontinued     3.375 g 12.5 mL/hr over 240 Minutes Intravenous Every 12 hours 06/13/19 1111 06/14/19 1316   06/11/19 2330  piperacillin-tazobactam (ZOSYN) IVPB 3.375 g  Status:  Discontinued     3.375 g 12.5 mL/hr over 240 Minutes Intravenous Every 8 hours 06/11/19 1852 06/13/19 1111   06/11/19 1715  piperacillin-tazobactam (ZOSYN) IVPB 3.375 g     3.375 g 100 mL/hr over 30 Minutes Intravenous  Once 06/11/19 1714 06/11/19 1942       Objective   Vitals:   06/18/19 0900 06/18/19 0930 06/18/19 1000 06/18/19 1014  BP: 132/70 (!) 159/105 113/84 123/67  Pulse: 80 92 74 82  Resp:    19  Temp:    98.4 F (36.9 C)  TempSrc:    Oral  SpO2:    93%   Weight:    (!) 179.5 kg  Height:        Intake/Output Summary (Last 24 hours) at 06/18/2019 1800 Last data filed  at 06/18/2019 1600 Gross per 24 hour  Intake 850 ml  Output 2055 ml  Net -1205 ml    06/07 1901 - 06/09 0700 In: 1590.5 [P.O.:860; I.V.:5] Out: 1638 [Urine:100; Drains:55]  Filed Weights   06/17/19 0708 06/18/19 0703 06/18/19 1014  Weight: (!) 184.5 kg (!) 180.7 kg (!) 179.5 kg    Physical Examination:    General: Appears in no acute distress  Cardiovascular: S1-S2, regular, no murmur auscultated  Respiratory: Clear to auscultation bilaterally, no wheezing or crackles auscultated  Abdomen: Abdomen is soft, nontender, no organomegaly  Extremities: No edema in the lower extremities   Neurologic:  alert, oriented x3, intact insight and judgment, no focal deficit noted    Data Reviewed:   Recent Results (from the past 240 hour(s))  SARS Coronavirus 2 by RT PCR (hospital order, performed in [facility] hospital lab) Nasopharyngeal Nasopharyngeal Swab     Status: None   Collection Time: 06/11/19  6:16 PM   Specimen: Nasopharyngeal Swab  Result Value Ref Range Status   SARS Coronavirus 2 NEGATIVE NEGATIVE Final    Comment: (NOTE) SARS-CoV-2 target nucleic acids are NOT DETECTED. The SARS-CoV-2 RNA is generally detectable in upper and lower respiratory specimens during the acute phase of infection. The lowest concentration of SARS-CoV-2 viral copies this assay can detect is 250 copies / mL. A negative result does not preclude SARS-CoV-2 infection and should not be used as the sole basis for treatment or other patient management decisions.  A negative result may occur with improper specimen collection / handling, submission of specimen other than nasopharyngeal swab, presence of viral mutation(s) within the areas targeted by this assay, and inadequate number of viral copies (<250 copies / mL). A negative result must be combined with clinical observations,  patient history, and epidemiological information. Fact Sheet for Patients:   StrictlyIdeas.no Fact Sheet for Healthcare Providers: BankingDealers.co.za This test is not yet approved or cleared  by the [place] FDA and has been authorized for detection and/or diagnosis of SARS-CoV-2 by FDA under an Emergency Use Authorization (EUA).  This EUA will remain in effect (meaning this test can be used) for the duration of the COVID-19 declaration under Section 564(b)(1) of the Act, 21 U.S.C. section 360bbb-3(b)(1), unless the authorization is terminated or revoked sooner. Performed at [facility], [place] [address], [place], [place] [address]   Culture, blood (Routine X 2) w Reflex to ID Panel     Status: Abnormal (Preliminary result)   Collection Time: 06/11/19  8:20 PM   Specimen: BLOOD  Result Value Ref Range Status   Specimen Description BLOOD BLOOD LEFT FOREARM  Final   Special Requests   Final    BOTTLES DRAWN AEROBIC AND ANAEROBIC Blood Culture results may not be optimal due to an inadequate volume of blood received in culture bottles   Culture  Setup Time (A)  Final    GRAM VARIABLE ROD ANAEROBIC BOTTLE ONLY CRITICAL RESULT CALLED TO, READ BACK BY AND VERIFIED WITH: PHARMD J FRENS AT 0815 06/16/19 BY L BENFIELD    Culture (A)  Final    GRAM VARIABLE ROD IDENTIFICATION TO FOLLOW Performed at [facility], [place] [address], [place], [place] [address]    Report Status PENDING  Incomplete  Blood Culture ID Panel (Reflexed)     Status: None   Collection Time: 06/11/19  8:20 PM  Result Value Ref Range Status   Enterococcus species NOT DETECTED NOT DETECTED Final   Listeria monocytogenes  NOT DETECTED NOT DETECTED Final   Staphylococcus species NOT DETECTED NOT DETECTED Final   Staphylococcus aureus (BCID) NOT DETECTED NOT DETECTED Final   Streptococcus species NOT DETECTED NOT DETECTED Final   Streptococcus agalactiae NOT  DETECTED NOT DETECTED Final   Streptococcus pneumoniae NOT DETECTED NOT DETECTED Final   Streptococcus pyogenes NOT DETECTED NOT DETECTED Final   Acinetobacter baumannii NOT DETECTED NOT DETECTED Final   Enterobacteriaceae species NOT DETECTED NOT DETECTED Final   Enterobacter cloacae complex NOT DETECTED NOT DETECTED Final   Escherichia coli NOT DETECTED NOT DETECTED Final   Klebsiella oxytoca NOT DETECTED NOT DETECTED Final   Klebsiella pneumoniae NOT DETECTED NOT DETECTED Final   Proteus species NOT DETECTED NOT DETECTED Final   Serratia marcescens NOT DETECTED NOT DETECTED Final   Haemophilus influenzae NOT DETECTED NOT DETECTED Final   Neisseria meningitidis NOT DETECTED NOT DETECTED Final   Pseudomonas aeruginosa NOT DETECTED NOT DETECTED Final   Candida albicans NOT DETECTED NOT DETECTED Final   Candida glabrata NOT DETECTED NOT DETECTED Final   Candida krusei NOT DETECTED NOT DETECTED Final   Candida parapsilosis NOT DETECTED NOT DETECTED Final   Candida tropicalis NOT DETECTED NOT DETECTED Final    Comment: Performed at [facility], [place] [address], [place], [place] [address]  Culture, blood (Routine X 2) w Reflex to ID Panel     Status: None   Collection Time: 06/11/19 10:00 PM   Specimen: BLOOD  Result Value Ref Range Status   Specimen Description BLOOD RIGHT HAND  Final   Special Requests   Final    BOTTLES DRAWN AEROBIC ONLY Blood Culture results may not be optimal due to an inadequate volume of blood received in culture bottles   Culture   Final    NO GROWTH 5 DAYS Performed at [facility], [place] [address], [place], [place] [address]    Report Status 06/16/2019 FINAL  Final  MRSA PCR Screening     Status: None   Collection Time: 06/15/19  9:26 AM   Specimen: Nasopharyngeal  Result Value Ref Range Status   MRSA by PCR NEGATIVE NEGATIVE Final    Comment:        The GeneXpert MRSA Assay (FDA approved for NASAL specimens only), is one component of  a comprehensive MRSA colonization surveillance program. It is not intended to diagnose MRSA infection nor to guide or monitor treatment for MRSA infections. Performed at [facility], [place] [address] [place], [place] [address]       [facility] If 7PM-7AM, please contact night-coverage at [email or web address], Office  [phone]   06/18/2019, 6:00 PM  LOS: 7 days

## 2019-06-18 NOTE — Progress Notes (Signed)
Patient ID: [name], female   DOB: [birth year], 59 y.o.   MRN: [id number] [facility] Progress Note   Assessment/ Plan:   1. Acute kidney Injury: Anuric, hemodynamically mediated likely with evolution to ATN in the setting of hypotension, sepsis and contrast exposure.  On NSAID (meloxicam) prior to admission. With uremic symptoms earlier in the week for which she was started on hemodialysis yesterday with her second treatment today and plan for third dialysis treatment tomorrow.  Urine output poor and encephalopathy appears to be slowly improving with hemodialysis.  2.  Sepsis secondary to perforated diverticulitis/intra-abdominal infection: Status post right lower quadrant drain placed by IR with antibiotic therapy changed to ceftriaxone/Flagyl.  Cultures pending for gram variable rods. 3.  History of ANCA vasculitis/microscopic polyangiitis: On maintenance with CellCept and methotrexate which are currently on hold secondary to sepsis.  Previously treated with corticosteroids/PLEX and rituximab. 4.  Non-anion gap metabolic acidosis: Secondary to acute kidney injury and GI losses, previously on sodium bicarbonate- will stop this now that she is on HD. 5.  Hypotension: Blood pressures under acceptable values on supportive management, off Entresto.  Subjective:   She complains of intermittent right lower quadrant pain.  Feeling like she is "losing her mind" because of confusion and disorientation.   Objective:   BP 123/67 (BP Location: Right Wrist)   Pulse 82   Temp 98.4 F (36.9 C) (Oral)   Resp 19   Ht 5\' 8"  (1.727 m)   Wt (!) 179.5 kg   SpO2 93%   BMI 60.17 kg/m   Intake/Output Summary (Last 24 hours) at 06/18/2019 1102 Last data filed at 06/18/2019 1014 Gross per 24 hour  Intake 930 ml  Output 2055 ml  Net -1125 ml   Weight change:   Physical Exam: Gen: Obese woman, appears to be comfortable resting in bed, still occasionally confused CVS: Pulse regular rhythm,  normal rate, S1 and S2 normal Resp: Clear to auscultation without any distinct rales or rhonchi Abd: Soft, obese, right lower quadrant tenderness with drain in situ. Ext: 1+ lower extremity edema  Imaging: IR Fluoro Guide CV Line Right  Result Date: 06/16/2019 INDICATION: Acute kidney injury, access for temporary dialysis EXAM: ULTRASOUND GUIDANCE FOR VASCULAR ACCESS RIGHT IJ TEMPORARY DIALYSIS CATHETER MEDICATIONS: 1% LIDOCAINE LOCAL ANESTHESIA/SEDATION: Moderate Sedation Time: None. The patient's level of consciousness and vital signs were monitored continuously by radiology nursing throughout the procedure under my direct supervision. FLUOROSCOPY TIME:  Fluoroscopy Time: 0 minutes 24 seconds (12 mGy). COMPLICATIONS: None immediate. PROCEDURE: Informed written consent was obtained from the patient after a thorough discussion of the procedural risks, benefits and alternatives. All questions were addressed. Maximal Sterile Barrier Technique was utilized including caps, mask, sterile gowns, sterile gloves, sterile drape, hand hygiene and skin antiseptic. A timeout was performed prior to the initiation of the procedure. Under sterile conditions and local anesthesia, ultrasound micropuncture access performed of the right internal jugular vein. Images obtained for documentation of the patent right internal jugular vein with ultrasound. Guidewire advanced followed by the micro dilator. Amplatz guidewire inserted. Tract dilatation performed to insert a 16 cm Mahurkar catheter. Tip SVC RA junction. Images obtained for documentation. Catheter secured Prolene sutures. Blood aspirated easily followed by saline and heparin flushes. Appropriate volume strength of heparin instilled in all 3 lumens followed by external caps. Sterile dressing applied. No immediate complication. Patient tolerated the procedure well. IMPRESSION: Successful ultrasound and fluoroscopic right IJ temporary dialysis catheter. Tip SVC RA junction.  Ready for  use. Electronically Signed   By: Jerilynn Mages.  Shick M.D.   On: 06/16/2019 13:20   IR US Guide Vasc Access Right  Result Date: 06/16/2019 INDICATION: Acute kidney injury, access for temporary dialysis EXAM: ULTRASOUND GUIDANCE FOR VASCULAR ACCESS RIGHT IJ TEMPORARY DIALYSIS CATHETER MEDICATIONS: 1% LIDOCAINE LOCAL ANESTHESIA/SEDATION: Moderate Sedation Time: None. The patient's level of consciousness and vital signs were monitored continuously by radiology nursing throughout the procedure under my direct supervision. FLUOROSCOPY TIME:  Fluoroscopy Time: 0 minutes 24 seconds (12 mGy). COMPLICATIONS: None immediate. PROCEDURE: Informed written consent was obtained from the patient after a thorough discussion of the procedural risks, benefits and alternatives. All questions were addressed. Maximal Sterile Barrier Technique was utilized including caps, mask, sterile gowns, sterile gloves, sterile drape, hand hygiene and skin antiseptic. A timeout was performed prior to the initiation of the procedure. Under sterile conditions and local anesthesia, ultrasound micropuncture access performed of the right internal jugular vein. Images obtained for documentation of the patent right internal jugular vein with ultrasound. Guidewire advanced followed by the micro dilator. Amplatz guidewire inserted. Tract dilatation performed to insert a 16 cm Mahurkar catheter. Tip SVC RA junction. Images obtained for documentation. Catheter secured Prolene sutures. Blood aspirated easily followed by saline and heparin flushes. Appropriate volume strength of heparin instilled in all 3 lumens followed by external caps. Sterile dressing applied. No immediate complication. Patient tolerated the procedure well. IMPRESSION: Successful ultrasound and fluoroscopic right IJ temporary dialysis catheter. Tip SVC RA junction. Ready for use. Electronically Signed   By: Jerilynn Mages.  Shick M.D.   On: 06/16/2019 13:20    Labs: BMET Recent Labs  Lab  06/12/19 0209 06/13/19 0321 06/14/19 0121 06/15/19 0710 06/16/19 0419 06/17/19 0221 06/18/19 0446  NA 140 138 135 139 136 134* 134*  K 4.2 4.3 4.1 4.5 4.4 4.7 3.6  CL 107 104 102 103 104 102 101  CO2 23 20* 18* 18* 15* 13* 19*  GLUCOSE 145* 138* 101* 82 102* 86 109*  BUN 19 36* 53* 73* 82* 91* 62*  CREATININE 1.66* 4.37* 5.75* 7.53* 8.01* 9.31* 7.63*  CALCIUM 8.2* 7.9* 7.4* 7.5* 7.4* 7.6* 7.8*  PHOS  --   --  4.8* 6.7* 7.5*  --   --    CBC Recent Labs  Lab 06/11/19 1243 06/11/19 2020 06/15/19 0710 06/16/19 0419 06/17/19 0221 06/18/19 0446  WBC 15.3*   < > 14.0* 12.2* 13.0* 13.2*  NEUTROABS 13.4*  --  11.6* 9.4*  --   --   HGB 13.1   < > 8.8* 8.9* 9.3* 9.5*  HCT 43.0   < > 29.4* 30.4* 29.7* 30.5*  MCV 87.2   < > 87.8 89.4 85.3 84.7  PLT 333   < > 272 256 275 280   < > = values in this interval not displayed.    Medications:    . buPROPion  450 mg Oral Daily  . Chlorhexidine Gluconate Cloth  6 each Topical Daily  . citalopram  40 mg Oral Daily  . enoxaparin (LOVENOX) injection  30 mg Subcutaneous Q24H  . feeding supplement (NEPRO CARB STEADY)  237 mL Oral BID BM  . insulin aspart  0-9 Units Subcutaneous Q4H  . pantoprazole (PROTONIX) IV  40 mg Intravenous Q24H  . saccharomyces boulardii  250 mg Oral BID  . sodium chloride flush  5 mL Intracatheter Q8H   Elmarie Shiley, MD 06/18/2019, 11:02 AM

## 2019-06-19 LAB — CBC
HCT: 31.8 % — ABNORMAL LOW (ref 36.0–46.0)
Hemoglobin: 9.7 g/dL — ABNORMAL LOW (ref 12.0–15.0)
MCH: 25.9 pg — ABNORMAL LOW (ref 26.0–34.0)
MCHC: 30.5 g/dL (ref 30.0–36.0)
MCV: 84.8 fL (ref 80.0–100.0)
Platelets: 306 10*3/uL (ref 150–400)
RBC: 3.75 MIL/uL — ABNORMAL LOW (ref 3.87–5.11)
RDW: 15.1 % (ref 11.5–15.5)
WBC: 14.3 10*3/uL — ABNORMAL HIGH (ref 4.0–10.5)
nRBC: 0 % (ref 0.0–0.2)

## 2019-06-19 LAB — BASIC METABOLIC PANEL WITH GFR
Anion gap: 14 (ref 5–15)
BUN: 40 mg/dL — ABNORMAL HIGH (ref 6–20)
CO2: 20 mmol/L — ABNORMAL LOW (ref 22–32)
Calcium: 8 mg/dL — ABNORMAL LOW (ref 8.9–10.3)
Chloride: 101 mmol/L (ref 98–111)
Creatinine, Ser: 5.7 mg/dL — ABNORMAL HIGH (ref 0.44–1.00)
GFR calc Af Amer: 9 mL/min — ABNORMAL LOW
GFR calc non Af Amer: 8 mL/min — ABNORMAL LOW
Glucose, Bld: 111 mg/dL — ABNORMAL HIGH (ref 70–99)
Potassium: 3.3 mmol/L — ABNORMAL LOW (ref 3.5–5.1)
Sodium: 135 mmol/L (ref 135–145)

## 2019-06-19 LAB — GLUCOSE, CAPILLARY
Glucose-Capillary: 108 mg/dL — ABNORMAL HIGH (ref 70–99)
Glucose-Capillary: 116 mg/dL — ABNORMAL HIGH (ref 70–99)
Glucose-Capillary: 138 mg/dL — ABNORMAL HIGH (ref 70–99)
Glucose-Capillary: 159 mg/dL — ABNORMAL HIGH (ref 70–99)
Glucose-Capillary: 160 mg/dL — ABNORMAL HIGH (ref 70–99)

## 2019-06-19 MED ORDER — SODIUM CHLORIDE 0.9% FLUSH
10.0000 mL | INTRAVENOUS | Status: DC | PRN
  Administered 2019-06-25: 10 mL

## 2019-06-19 MED ORDER — SODIUM CHLORIDE 0.9% FLUSH
10.0000 mL | Freq: Two times a day (BID) | INTRAVENOUS | Status: DC
  Administered 2019-06-19 – 2019-06-27 (×7): 10 mL

## 2019-06-19 MED ORDER — HEPARIN SODIUM (PORCINE) 1000 UNIT/ML IJ SOLN
INTRAMUSCULAR | Status: AC
  Administered 2019-06-19: 2600 [IU] via ARTERIOVENOUS_FISTULA
  Filled 2019-06-19: qty 4

## 2019-06-19 NOTE — Progress Notes (Signed)
[facility]  Date of Admission:  06/11/2019     Total days of antibiotics 9         ASSESSMENT:  Ms. Morell has a mild increase in WBC count today. Currently in dialysis and awaiting CT scan ordered by General Surgery.  Has remained afebrile. No drain output was recorded but feculent material present. Will continue current dose of ceftriaxone and metronidazole and await CT scan results. Gram variable rod in blood culture remains pending.   PLAN:  1. Continue ceftriaxone and metronidazole. 2. Drain management per interventional radiology. 3. Await CT scan results 4. Continue dialysis per nephrology.   Principal Problem:   Acute diverticulitis Active Problems:   Obesity (BMI 30-39.9)   ILD (interstitial lung disease) ([place])   Hypertension   Acute kidney injury (AKI) with acute tubular necrosis (ATN) ([place])   . buPROPion  450 mg Oral Daily  . Chlorhexidine Gluconate Cloth  6 each Topical Daily  . citalopram  40 mg Oral Daily  . enoxaparin (LOVENOX) injection  30 mg Subcutaneous Q24H  . feeding supplement (NEPRO CARB STEADY)  237 mL Oral BID BM  . insulin aspart  0-9 Units Subcutaneous Q4H  . pantoprazole (PROTONIX) IV  40 mg Intravenous Q24H  . saccharomyces boulardii  250 mg Oral BID  . sodium chloride flush  5 mL Intracatheter Q8H    SUBJECTIVE:  Afebrile overnight with no acute events. Continues to have crampy abdominal pain. Mild increase in WBC count.   Allergies  Allergen Reactions  . Guaifenesin Nausea And Vomiting and Other (See Comments)    un  . Mucinex [Guaifenesin Er] Nausea And Vomiting  . Sulfa Antibiotics Nausea And Vomiting     Review of Systems: Review of Systems  Constitutional: Negative for chills, fever and weight loss.  Respiratory: Negative for cough, shortness of breath and wheezing.   Cardiovascular: Negative for chest pain and leg swelling.  Gastrointestinal: Positive for abdominal pain. Negative for  constipation, diarrhea, nausea and vomiting.  Skin: Negative for rash.      OBJECTIVE: Vitals:   06/19/19 0900 06/19/19 0930 06/19/19 1000 06/19/19 1030  BP: (!) 141/65 (!) 146/71 (!) 159/69 (!) 153/82  Pulse: 88 85 87 90  Resp:      Temp:      TempSrc:      SpO2:      Weight:      Height:       Body mass index is 60.37 kg/m.  Physical Exam Constitutional:      General: She is not in acute distress.    Appearance: She is well-developed.  Cardiovascular:     Rate and Rhythm: Normal rate and regular rhythm.     Heart sounds: Normal heart sounds.  Pulmonary:     Effort: Pulmonary effort is normal.     Breath sounds: Normal breath sounds.  Abdominal:     Tenderness: There is abdominal tenderness.  Skin:    General: Skin is warm and dry.  Neurological:     Mental Status: She is alert and oriented to person, place, and time.  Psychiatric:        Behavior: Behavior normal.        Thought Content: Thought content normal.        Judgment: Judgment normal.     Lab Results Lab Results  Component Value Date   WBC 14.3 (H) 06/19/2019   HGB 9.7 (L) 06/19/2019   HCT 31.8 (L) 06/19/2019  MCV 84.8 06/19/2019   PLT 306 06/19/2019    Lab Results  Component Value Date   CREATININE 5.70 (H) 06/19/2019   BUN 40 (H) 06/19/2019   NA 135 06/19/2019   K 3.3 (L) 06/19/2019   CL 101 06/19/2019   CO2 20 (L) 06/19/2019    Lab Results  Component Value Date   ALT 22 06/18/2019   AST 38 06/18/2019   ALKPHOS 102 06/18/2019   BILITOT 0.5 06/18/2019     Microbiology: Recent Results (from the past 240 hour(s))  SARS Coronavirus 2 by RT PCR (hospital order, performed in [place] hospital lab) Nasopharyngeal Nasopharyngeal Swab     Status: None   Collection Time: 06/11/19  6:16 PM   Specimen: Nasopharyngeal Swab  Result Value Ref Range Status   SARS Coronavirus 2 NEGATIVE NEGATIVE Final    Comment: (NOTE) SARS-CoV-2 target nucleic acids are NOT DETECTED. The SARS-CoV-2 RNA  is generally detectable in upper and lower respiratory specimens during the acute phase of infection. The lowest concentration of SARS-CoV-2 viral copies this assay can detect is 250 copies / mL. A negative result does not preclude SARS-CoV-2 infection and should not be used as the sole basis for treatment or other patient management decisions.  A negative result may occur with improper specimen collection / handling, submission of specimen other than nasopharyngeal swab, presence of viral mutation(s) within the areas targeted by this assay, and inadequate number of viral copies (<250 copies / mL). A negative result must be combined with clinical observations, patient history, and epidemiological information. Fact Sheet for Patients:   StrictlyIdeas.no Fact Sheet for Healthcare Providers: BankingDealers.co.za This test is not yet approved or cleared  by the [place] FDA and has been authorized for detection and/or diagnosis of SARS-CoV-2 by FDA under an Emergency Use Authorization (EUA).  This EUA will remain in effect (meaning this test can be used) for the duration of the COVID-19 declaration under Section 564(b)(1) of the Act, 21 U.S.C. section 360bbb-3(b)(1), unless the authorization is terminated or revoked sooner. Performed at [facility], [place] [address] [place], [place] [address]   Culture, blood (Routine X 2) w Reflex to ID Panel     Status: Abnormal (Preliminary result)   Collection Time: 06/11/19  8:20 PM   Specimen: BLOOD  Result Value Ref Range Status   Specimen Description BLOOD BLOOD LEFT FOREARM  Final   Special Requests   Final    BOTTLES DRAWN AEROBIC AND ANAEROBIC Blood Culture results may not be optimal due to an inadequate volume of blood received in culture bottles   Culture  Setup Time (A)  Final    GRAM VARIABLE ROD ANAEROBIC BOTTLE ONLY CRITICAL RESULT CALLED TO, READ BACK BY AND VERIFIED WITH:  PHARMD J FRENS AT 0815 06/16/19 BY L BENFIELD    Culture (A)  Final    GRAM VARIABLE ROD IDENTIFICATION TO FOLLOW Performed at [facility], [place] [address], [place], [place] [address]    Report Status PENDING  Incomplete  Blood Culture ID Panel (Reflexed)     Status: None   Collection Time: 06/11/19  8:20 PM  Result Value Ref Range Status   Enterococcus species NOT DETECTED NOT DETECTED Final   Listeria monocytogenes NOT DETECTED NOT DETECTED Final   Staphylococcus species NOT DETECTED NOT DETECTED Final   Staphylococcus aureus (BCID) NOT DETECTED NOT DETECTED Final   Streptococcus species NOT DETECTED NOT DETECTED Final   Streptococcus agalactiae NOT DETECTED NOT DETECTED  Final   Streptococcus pneumoniae NOT DETECTED NOT DETECTED Final   Streptococcus pyogenes NOT DETECTED NOT DETECTED Final   Acinetobacter baumannii NOT DETECTED NOT DETECTED Final   Enterobacteriaceae species NOT DETECTED NOT DETECTED Final   Enterobacter cloacae complex NOT DETECTED NOT DETECTED Final   Escherichia coli NOT DETECTED NOT DETECTED Final   Klebsiella oxytoca NOT DETECTED NOT DETECTED Final   Klebsiella pneumoniae NOT DETECTED NOT DETECTED Final   Proteus species NOT DETECTED NOT DETECTED Final   Serratia marcescens NOT DETECTED NOT DETECTED Final   Haemophilus influenzae NOT DETECTED NOT DETECTED Final   Neisseria meningitidis NOT DETECTED NOT DETECTED Final   Pseudomonas aeruginosa NOT DETECTED NOT DETECTED Final   Candida albicans NOT DETECTED NOT DETECTED Final   Candida glabrata NOT DETECTED NOT DETECTED Final   Candida krusei NOT DETECTED NOT DETECTED Final   Candida parapsilosis NOT DETECTED NOT DETECTED Final   Candida tropicalis NOT DETECTED NOT DETECTED Final    Comment: Performed at [facility], [place] [address], [place], [place] [address]  Culture, blood (Routine X 2) w Reflex to ID Panel     Status: None   Collection Time: 06/11/19 10:00 PM   Specimen: BLOOD  Result Value  Ref Range Status   Specimen Description BLOOD RIGHT HAND  Final   Special Requests   Final    BOTTLES DRAWN AEROBIC ONLY Blood Culture results may not be optimal due to an inadequate volume of blood received in culture bottles   Culture   Final    NO GROWTH 5 DAYS Performed at [facility], [place] [address] [place], [place] [address]    Report Status 06/16/2019 FINAL  Final  MRSA PCR Screening     Status: None   Collection Time: 06/15/19  9:26 AM   Specimen: Nasopharyngeal  Result Value Ref Range Status   MRSA by PCR NEGATIVE NEGATIVE Final    Comment:        The GeneXpert MRSA Assay (FDA approved for NASAL specimens only), is one component of a comprehensive MRSA colonization surveillance program. It is not intended to diagnose MRSA infection nor to guide or monitor treatment for MRSA infections. Performed at [facility], [place] [address], [place], [place] [address]      Terri Piedra, [facility]  06/19/2019  10:58 AM

## 2019-06-19 NOTE — Procedures (Signed)
Patient seen on Hemodialysis. BP (!) 165/84   Pulse 85   Temp 98.8 F (37.1 C) (Oral)   Resp 18   Ht 5\' 8"  (1.727 m)   Wt (!) 180.1 kg   SpO2 95%   BMI 60.37 kg/m   QB 400, UF goal 2.5L Tolerating treatment without complaints at this time.   Elmarie Shiley MD [facility] Office # [phone] Pager # [phone] 9:38 AM

## 2019-06-19 NOTE — Progress Notes (Signed)
Patient ID: [name], female   DOB: 13-Sep-1960, 59 y.o.   MRN: [id number] [name] Progress Note   Assessment/ Plan:   1. Acute kidney Injury: Nonoliguric with 600 cc urine output charted overnight-possibly an early sign of ensuing renal recovery.  Kidney injury appears to have been hemodynamically mediated likely with evolution to ATN in the setting of hypotension, sepsis and contrast exposure with meloxicam use prior to admission.  This is her third dialysis treatment for management of severe azotemia and some volume excess with the plan to potentially monitor her over the weekend for renal recovery.  2.  Sepsis secondary to perforated diverticulitis/intra-abdominal infection: Status post right lower quadrant drain placed by IR with antibiotic therapy changed to ceftriaxone/Flagyl.  Cultures pending for gram variable rods.  With her worsening leukocytosis, plans noted for repeat CT scan of the abdomen and pelvis today. 3.  History of ANCA vasculitis/microscopic polyangiitis: On maintenance with CellCept and methotrexate which are currently on hold secondary to sepsis.  Previously treated with corticosteroids/PLEX and rituximab. 4.  Non-anion gap metabolic acidosis: Secondary to acute kidney injury and GI losses, improved with sodium bicarbonate/HD. 5.  Hypotension: Blood pressures now rising off Entresto.  Will monitor trend to decide on need for antihypertensive therapy.  Subjective:   Reports that she is still intermittently confused and having problems with memory/recall of events.  With some intermittent right lower quadrant discomfort.   Objective:   BP (!) 165/84   Pulse 85   Temp 98.8 F (37.1 C) (Oral)   Resp 18   Ht 5\' 8"  (1.727 m)   Wt (!) 180.1 kg   SpO2 95%   BMI 60.37 kg/m   Intake/Output Summary (Last 24 hours) at 06/19/2019 0859 Last data filed at 06/19/2019 0300 Gross per 24 hour  Intake 240 ml  Output 2600 ml  Net -2360 ml   Weight change: -3.8  kg  Physical Exam: Gen: Obese woman, somnolent, comfortably resting in dialysis. CVS: Pulse regular rhythm, normal rate, S1 and S2 normal Resp: Clear to auscultation without any distinct rales or rhonchi Abd: Soft, obese, right lower quadrant tenderness with drain in situ. Ext: 1+ lower extremity edema  Imaging: No results found.  Labs: BMET Recent Labs  Lab 06/13/19 0321 06/14/19 0121 06/15/19 0710 06/16/19 0419 06/17/19 0221 06/18/19 0446 06/19/19 0446  NA 138 135 139 136 134* 134* 135  K 4.3 4.1 4.5 4.4 4.7 3.6 3.3*  CL 104 102 103 104 102 101 101  CO2 20* 18* 18* 15* 13* 19* 20*  GLUCOSE 138* 101* 82 102* 86 109* 111*  BUN 36* 53* 73* 82* 91* 62* 40*  CREATININE 4.37* 5.75* 7.53* 8.01* 9.31* 7.63* 5.70*  CALCIUM 7.9* 7.4* 7.5* 7.4* 7.6* 7.8* 8.0*  PHOS  --  4.8* 6.7* 7.5*  --   --   --    CBC Recent Labs  Lab 06/15/19 0710 06/15/19 0710 06/16/19 0419 06/17/19 0221 06/18/19 0446 06/19/19 0446  WBC 14.0*   < > 12.2* 13.0* 13.2* 14.3*  NEUTROABS 11.6*  --  9.4*  --   --   --   HGB 8.8*   < > 8.9* 9.3* 9.5* 9.7*  HCT 29.4*   < > 30.4* 29.7* 30.5* 31.8*  MCV 87.8   < > 89.4 85.3 84.7 84.8  PLT 272   < > 256 275 280 306   < > = values in this interval not displayed.    Medications:    .  buPROPion  450 mg Oral Daily  . Chlorhexidine Gluconate Cloth  6 each Topical Daily  . citalopram  40 mg Oral Daily  . enoxaparin (LOVENOX) injection  30 mg Subcutaneous Q24H  . feeding supplement (NEPRO CARB STEADY)  237 mL Oral BID BM  . insulin aspart  0-9 Units Subcutaneous Q4H  . pantoprazole (PROTONIX) IV  40 mg Intravenous Q24H  . saccharomyces boulardii  250 mg Oral BID  . sodium chloride flush  5 mL Intracatheter Q8H   Elmarie Shiley, MD 06/19/2019, 8:59 AM

## 2019-06-19 NOTE — Progress Notes (Addendum)
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  In the ED CT abdomen pelvis showed sigmoid diverticulitis.  Patient was started on IV Zosyn.  Hospitalist was consulted for admission.    Subjective   Patient seen and examined, denies any complaints.  Renal function slowly improving.    Assessment/Plan:     1. Acute sigmoid diverticulitis-patient initially presented with sigmoid vasculitis seen on CT.  As patient had worsening symptoms repeat CT was done which showed possibility of perforation without abscess collection.  General surgery was recommended who recommended IR for drain placement.  Patient had drain placement on 06/13/2019 with purulent material expressed.  WBC is 14,000.  She was initially on IV Zosyn however was changed to Rocephin and Flagyl.  ID and general surgery following.  CT abdomen pelvis ordered per general surgery.  Will await CT results.  Blood culture growing gram-variable rods.  Final result is pending 2. Acute kidney injury-suspected ATN, urine presentation showed no blood or protein.  Likely induced by hypotension, contrast-induced nephropathy and Entresto.  Nephrology was consulted.  Patient was started on hemodialysis for uremic symptoms.  Creatinine slowly improving.  Patient now making urine 600 cc overnight. 3. 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  She is on maintenance with CellCept and methotrexate which are currently on hold secondary to sepsis.  Previously treated with corticosteroids/Plex and rituximab. 4. Hypertension-hemoglobin A1c 7.0.  Oral hypo-glycemic agents on hold.  Continue  sliding scale insulin with NovoLog. 5. Hypertension-Entresto has been stopped.  Patient was taking Coreg at home.  Currently she is on IV labetalol 20 mg IV every 2 hours as needed for SBP more than 160.  Blood pressure is stable. 6. Morbid obesity    SpO2: 94 % O2 Flow Rate (L/min): 2 L/min   COVID-19 Labs  No results for input(s): DDIMER, FERRITIN, LDH, CRP in the last 72 hours.  Lab Results  Component Value Date   [place] NEGATIVE 06/11/2019   [place] NEGATIVE 05/30/2019     CBG: Recent Labs  Lab 06/18/19 0425 06/18/19 2020 06/19/19 0043 06/19/19 0422 06/19/19 1232  GLUCAP 117* 151* 138* 116* 108*    CBC: Recent Labs  Lab 06/15/19 0710 06/16/19 0419 06/17/19 0221 06/18/19 0446 06/19/19 0446  WBC 14.0* 12.2* 13.0* 13.2* 14.3*  NEUTROABS 11.6* 9.4*  --   --   --   HGB 8.8* 8.9* 9.3* 9.5* 9.7*  HCT 29.4* 30.4* 29.7* 30.5* 31.8*  MCV 87.8 89.4 85.3 84.7 84.8  PLT 272 256 275 280 237    Basic Metabolic Panel: Recent Labs  Lab 06/14/19 0121 06/14/19 0121 06/15/19 0710 06/16/19 0419 06/17/19 0221 06/18/19 0446 06/19/19 0446  NA 135   < > 139 136 134* 134* 135  K 4.1   < > 4.5 4.4 4.7 3.6 3.3*  CL 102   < > 103 104 102 101 101  CO2 18*   < > 18* 15* 13* 19* 20*  GLUCOSE 101*   < > 82 102* 86 109* 111*  BUN 53*   < > 73* 82* 91* 62* 40*  CREATININE 5.75*   < > 7.53* 8.01* 9.31* 7.63* 5.70*  CALCIUM 7.4*   < >  7.5* 7.4* 7.6* 7.8* 8.0*  MG  --   --   --   --   --  2.1  --   PHOS 4.8*  --  6.7* 7.5*  --   --   --    < > = values in this interval not displayed.     Liver Function Tests: Recent Labs  Lab 06/13/19 0321 06/13/19 0321 06/14/19 0121 06/15/19 0710 06/16/19 0419 06/17/19 0221 06/18/19 0446  AST 21  --   --   --   --  29 38  ALT 15  --   --   --   --  22 22  ALKPHOS 94  --   --   --   --  100 102  BILITOT 1.1  --   --   --   --  0.3 0.5  PROT 6.4*  --   --   --   --  5.8* 5.7*  ALBUMIN 2.9*   < > 2.5* 2.3* 2.3* 2.4* 2.2*   <  > = values in this interval not displayed.        DVT prophylaxis: Lovenox  Code Status: Full code  Family Communication: No family at bedside    Status is: Inpatient  Dispo: The patient is from: Home              Anticipated d/c is to: Home              Anticipated d/c date is: 06/21/2019              Patient currently admitted for acute kidney injury, sigmoid diverticulitis  Barrier to discharge-acute kidney injury, sigmoid diverticulitis, patient on dialysis        Scheduled medications:  . buPROPion  450 mg Oral Daily  . Chlorhexidine Gluconate Cloth  6 each Topical Daily  . citalopram  40 mg Oral Daily  . enoxaparin (LOVENOX) injection  30 mg Subcutaneous Q24H  . feeding supplement (NEPRO CARB STEADY)  237 mL Oral BID BM  . insulin aspart  0-9 Units Subcutaneous Q4H  . pantoprazole (PROTONIX) IV  40 mg Intravenous Q24H  . saccharomyces boulardii  250 mg Oral BID  . sodium chloride flush  5 mL Intracatheter Q8H    Consultants:  Nephrology  ID  IR  General surgery  Procedures:    Antibiotics:   Anti-infectives (From admission, onward)   Start     Dose/Rate Route Frequency Ordered Stop   06/14/19 1330  cefTRIAXone (ROCEPHIN) 2 g in sodium chloride 0.9 % 100 mL IVPB     Discontinue     2 g 200 mL/hr over 30 Minutes Intravenous Every 24 hours 06/14/19 1316     06/14/19 1330  metroNIDAZOLE (FLAGYL) IVPB 500 mg     Discontinue     500 mg 100 mL/hr over 60 Minutes Intravenous Every 8 hours 06/14/19 1316     06/13/19 2200  piperacillin-tazobactam (ZOSYN) IVPB 3.375 g  Status:  Discontinued        3.375 g 12.5 mL/hr over 240 Minutes Intravenous Every 12 hours 06/13/19 1111 06/14/19 1316   06/11/19 2330  piperacillin-tazobactam (ZOSYN) IVPB 3.375 g  Status:  Discontinued        3.375 g 12.5 mL/hr over 240 Minutes Intravenous Every 8 hours 06/11/19 1852 06/13/19 1111   06/11/19 1715  piperacillin-tazobactam (ZOSYN) IVPB 3.375 g        3.375 g 100  mL/hr over 30 Minutes Intravenous  Once  06/11/19 1714 06/11/19 1942       Objective   Vitals:   06/19/19 0930 06/19/19 1000 06/19/19 1030 06/19/19 1059  BP: (!) 146/71 (!) 159/69 (!) 153/82 (!) 141/68  Pulse: 85 87 90 89  Resp:    19  Temp:    98.6 F (37 C)  TempSrc:    Oral  SpO2:    94%  Weight:    (!) 176.6 kg  Height:        Intake/Output Summary (Last 24 hours) at 06/19/2019 1437 Last data filed at 06/19/2019 1059 Gross per 24 hour  Intake 240 ml  Output 3006 ml  Net -2766 ml    06/08 1901 - 06/10 0700 In: 350 [P.O.:240; I.V.:5] Out: 2655 [Urine:600; Drains:55]  Filed Weights   06/19/19 0422 06/19/19 0720 06/19/19 1059  Weight: (!) 180.5 kg (!) 180.1 kg (!) 176.6 kg    Physical Examination:    General-appears in no acute distress  Heart-S1-S2, regular, no murmur auscultated  Lungs-clear to auscultation bilaterally, no wheezing or crackles auscultated  Abdomen-soft, nontender, no organomegaly  Extremities-no edema in the lower extremities  Neuro-alert, oriented x3, no focal deficit noted    Data Reviewed:   Recent Results (from the past 240 hour(s))  SARS Coronavirus 2 by RT PCR (hospital order, performed in [facility] hospital lab) Nasopharyngeal Nasopharyngeal Swab     Status: None   Collection Time: 06/11/19  6:16 PM   Specimen: Nasopharyngeal Swab  Result Value Ref Range Status   SARS Coronavirus 2 NEGATIVE NEGATIVE Final    Comment: (NOTE) SARS-CoV-2 target nucleic acids are NOT DETECTED. The SARS-CoV-2 RNA is generally detectable in upper and lower respiratory specimens during the acute phase of infection. The lowest concentration of SARS-CoV-2 viral copies this assay can detect is 250 copies / mL. A negative result does not preclude SARS-CoV-2 infection and should not be used as the sole basis for treatment or other patient management decisions.  A negative result may occur with improper specimen collection / handling, submission of  specimen other than nasopharyngeal swab, presence of viral mutation(s) within the areas targeted by this assay, and inadequate number of viral copies (<250 copies / mL). A negative result must be combined with clinical observations, patient history, and epidemiological information. Fact Sheet for Patients:   StrictlyIdeas.no Fact Sheet for Healthcare Providers: BankingDealers.co.za This test is not yet approved or cleared  by the [place] FDA and has been authorized for detection and/or diagnosis of SARS-CoV-2 by FDA under an Emergency Use Authorization (EUA).  This EUA will remain in effect (meaning this test can be used) for the duration of the COVID-19 declaration under Section 564(b)(1) of the Act, 21 U.S.C. section 360bbb-3(b)(1), unless the authorization is terminated or revoked sooner. Performed at [facility], [place] [address] [place], [place] [address]   Culture, blood (Routine X 2) w Reflex to ID Panel     Status: Abnormal (Preliminary result)   Collection Time: 06/11/19  8:20 PM   Specimen: BLOOD  Result Value Ref Range Status   Specimen Description BLOOD BLOOD LEFT FOREARM  Final   Special Requests   Final    BOTTLES DRAWN AEROBIC AND ANAEROBIC Blood Culture results may not be optimal due to an inadequate volume of blood received in culture bottles   Culture  Setup Time (A)  Final    GRAM VARIABLE ROD ANAEROBIC BOTTLE ONLY CRITICAL RESULT CALLED TO, READ BACK BY AND VERIFIED WITH: Tiro AT  0815 06/16/19 BY L BENFIELD    Culture (A)  Final    GRAM VARIABLE ROD IDENTIFICATION TO FOLLOW Performed at [facility], [place] [address] [place], [place] [address]    Report Status PENDING  Incomplete  Blood Culture ID Panel (Reflexed)     Status: None   Collection Time: 06/11/19  8:20 PM  Result Value Ref Range Status   Enterococcus species NOT DETECTED NOT DETECTED Final   Listeria monocytogenes NOT  DETECTED NOT DETECTED Final   Staphylococcus species NOT DETECTED NOT DETECTED Final   Staphylococcus aureus (BCID) NOT DETECTED NOT DETECTED Final   Streptococcus species NOT DETECTED NOT DETECTED Final   Streptococcus agalactiae NOT DETECTED NOT DETECTED Final   Streptococcus pneumoniae NOT DETECTED NOT DETECTED Final   Streptococcus pyogenes NOT DETECTED NOT DETECTED Final   Acinetobacter baumannii NOT DETECTED NOT DETECTED Final   Enterobacteriaceae species NOT DETECTED NOT DETECTED Final   Enterobacter cloacae complex NOT DETECTED NOT DETECTED Final   Escherichia coli NOT DETECTED NOT DETECTED Final   Klebsiella oxytoca NOT DETECTED NOT DETECTED Final   Klebsiella pneumoniae NOT DETECTED NOT DETECTED Final   Proteus species NOT DETECTED NOT DETECTED Final   Serratia marcescens NOT DETECTED NOT DETECTED Final   Haemophilus influenzae NOT DETECTED NOT DETECTED Final   Neisseria meningitidis NOT DETECTED NOT DETECTED Final   Pseudomonas aeruginosa NOT DETECTED NOT DETECTED Final   Candida albicans NOT DETECTED NOT DETECTED Final   Candida glabrata NOT DETECTED NOT DETECTED Final   Candida krusei NOT DETECTED NOT DETECTED Final   Candida parapsilosis NOT DETECTED NOT DETECTED Final   Candida tropicalis NOT DETECTED NOT DETECTED Final    Comment: Performed at [facility], [place] [address] [place], [place] [address]  Culture, blood (Routine X 2) w Reflex to ID Panel     Status: None   Collection Time: 06/11/19 10:00 PM   Specimen: BLOOD  Result Value Ref Range Status   Specimen Description BLOOD RIGHT HAND  Final   Special Requests   Final    BOTTLES DRAWN AEROBIC ONLY Blood Culture results may not be optimal due to an inadequate volume of blood received in culture bottles   Culture   Final    NO GROWTH 5 DAYS Performed at [facility], [place] [address], [place], [place] [address]    Report Status 06/16/2019 FINAL  Final  MRSA PCR Screening     Status: None   Collection  Time: 06/15/19  9:26 AM   Specimen: Nasopharyngeal  Result Value Ref Range Status   MRSA by PCR NEGATIVE NEGATIVE Final    Comment:        The GeneXpert MRSA Assay (FDA approved for NASAL specimens only), is one component of a comprehensive MRSA colonization surveillance program. It is not intended to diagnose MRSA infection nor to guide or monitor treatment for MRSA infections. Performed at [facility], [place] [address], [place], [place] [address]       [facility] If 7PM-7AM, please contact night-coverage at [email or web address], Office  [phone]   06/19/2019, 2:37 PM  LOS: 8 days

## 2019-06-19 NOTE — Progress Notes (Signed)
[facility] Progress Note     Subjective: CC-  Seen in HD. Tired this morning and intermittently falls asleep during conversation. States that her abdomen is the same. Continues to have right sided crampy abdominal pain. Tolerating diet but not eating much. Denies n/v. BM yesterday. WBC up to 14.3.  Objective: Vital signs in last 24 hours: Temp:  [97.6 F (36.4 C)-99.5 F (37.5 C)] 99.5 F (37.5 C) (06/10 0422) Pulse Rate:  [74-92] 90 (06/10 0422) Resp:  [14-19] 16 (06/10 0422) BP: (100-159)/(61-105) 144/72 (06/10 0422) SpO2:  [92 %-93 %] 92 % (06/10 0422) Weight:  [179.5 kg-180.5 kg] 180.5 kg (06/10 0422) Last BM Date: 06/18/19  Intake/Output from previous day: 06/09 0701 - 06/10 0700 In: 240 [P.O.:240] Out: 2600 [Urine:600] Intake/Output this shift: No intake/output data recorded.  PE: Gen: Alert but drowsy, NAD Pulm: rate and effort normal EUM:PNTIR, soft,+BS,RLQ drain withair andfeculent fluid in bag, TTP around drain and epigastric region,otherwise nontender  Lab Results:  Recent Labs    06/18/19 0446 06/19/19 0446  WBC 13.2* 14.3*  HGB 9.5* 9.7*  HCT 30.5* 31.8*  PLT 280 306   BMET Recent Labs    06/18/19 0446 06/19/19 0446  NA 134* 135  K 3.6 3.3*  CL 101 101  CO2 19* 20*  GLUCOSE 109* 111*  BUN 62* 40*  CREATININE 7.63* 5.70*  CALCIUM 7.8* 8.0*   PT/INR No results for input(s): LABPROT, INR in the last 72 hours. CMP     Component Value Date/Time   NA 135 06/19/2019 0446   NA 139 12/24/2017 1641   K 3.3 (L) 06/19/2019 0446   CL 101 06/19/2019 0446   CO2 20 (L) 06/19/2019 0446   GLUCOSE 111 (H) 06/19/2019 0446   BUN 40 (H) 06/19/2019 0446   BUN 15 12/24/2017 1641   CREATININE 5.70 (H) 06/19/2019 0446   CALCIUM 8.0 (L) 06/19/2019 0446   PROT 5.7 (L) 06/18/2019 0446   ALBUMIN 2.2 (L) 06/18/2019 0446   AST 38 06/18/2019 0446   ALT 22 06/18/2019 0446   ALKPHOS 102 06/18/2019 0446   BILITOT 0.5 06/18/2019 0446    GFRNONAA 8 (L) 06/19/2019 0446   GFRAA 9 (L) 06/19/2019 0446   Lipase     Component Value Date/Time   LIPASE 15 06/11/2019 1243       Studies/Results: No results found.  Anti-infectives: Anti-infectives (From admission, onward)   Start     Dose/Rate Route Frequency Ordered Stop   06/14/19 1330  cefTRIAXone (ROCEPHIN) 2 g in sodium chloride 0.9 % 100 mL IVPB     Discontinue     2 g 200 mL/hr over 30 Minutes Intravenous Every 24 hours 06/14/19 1316     06/14/19 1330  metroNIDAZOLE (FLAGYL) IVPB 500 mg     Discontinue     500 mg 100 mL/hr over 60 Minutes Intravenous Every 8 hours 06/14/19 1316     06/13/19 2200  piperacillin-tazobactam (ZOSYN) IVPB 3.375 g  Status:  Discontinued        3.375 g 12.5 mL/hr over 240 Minutes Intravenous Every 12 hours 06/13/19 1111 06/14/19 1316   06/11/19 2330  piperacillin-tazobactam (ZOSYN) IVPB 3.375 g  Status:  Discontinued        3.375 g 12.5 mL/hr over 240 Minutes Intravenous Every 8 hours 06/11/19 1852 06/13/19 1111   06/11/19 1715  piperacillin-tazobactam (ZOSYN) IVPB 3.375 g        3.375 g 100 mL/hr over 30 Minutes Intravenous  Once 06/11/19 1714  06/11/19 1942       Assessment/Plan 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 Currently undergoing HD.Nephrology following Urinary Retention - Foley placed 6/4 Malnutrition - prealbumin 6.7  Acute sigmoid diverticulitis with perforationand abscess - 2nd bout,history of diverticulitis in 2014 - last colonoscopy >10 years ago - CT scan6/3showedprogression of acute sigmoid diverticulitis now with evidence of perforation, increasing fluid in the right lower quadrant with gas fluid level, no rim enhancement to suggest organized fluid collection or abscess at this time - S/p IR drain on 6/4, cx pending?   - Continue abx. Per ID (switched to rocephin/flagyl due to worsening renal failure) - Continue PT, mobilize.  ID- zosyn 6/2-6/5, rocephin/flagyl 6/5>> FEN -IVF, soft  VTE -SCDs, lovenox Foley -in place Follow up -TBD  Plan: Worsening leukocytosis, repeat CT abd/pelvis today. Continue IV antibiotics and drain.   LOS: 8 days    [facility] 06/19/2019, 7:51 AM Please see Amion for pager number during day hours 7:00am-4:30pm

## 2019-06-19 NOTE — Plan of Care (Signed)
  Problem: Education: Goal: Knowledge of General Education information will improve Description Including pain rating scale, medication(s)/side effects and non-pharmacologic comfort measures Outcome: Progressing   

## 2019-06-20 ENCOUNTER — Other Ambulatory Visit: Payer: Self-pay

## 2019-06-20 ENCOUNTER — Inpatient Hospital Stay (HOSPITAL_COMMUNITY): Payer: Commercial Managed Care - PPO

## 2019-06-20 LAB — RENAL FUNCTION PANEL
Albumin: 2.4 g/dL — ABNORMAL LOW (ref 3.5–5.0)
Anion gap: 13 (ref 5–15)
BUN: 25 mg/dL — ABNORMAL HIGH (ref 6–20)
CO2: 23 mmol/L (ref 22–32)
Calcium: 8.2 mg/dL — ABNORMAL LOW (ref 8.9–10.3)
Chloride: 101 mmol/L (ref 98–111)
Creatinine, Ser: 4.48 mg/dL — ABNORMAL HIGH (ref 0.44–1.00)
GFR calc Af Amer: 12 mL/min — ABNORMAL LOW (ref >60)
GFR calc non Af Amer: 10 mL/min — ABNORMAL LOW (ref >60)
Glucose, Bld: 138 mg/dL — ABNORMAL HIGH (ref 70–99)
Phosphorus: 4.9 mg/dL — ABNORMAL HIGH (ref 2.5–4.6)
Potassium: 3.3 mmol/L — ABNORMAL LOW (ref 3.5–5.1)
Sodium: 137 mmol/L (ref 135–145)

## 2019-06-20 LAB — CULTURE, BLOOD (ROUTINE X 2)

## 2019-06-20 LAB — GLUCOSE, CAPILLARY
Glucose-Capillary: 113 mg/dL — ABNORMAL HIGH (ref 70–99)
Glucose-Capillary: 115 mg/dL — ABNORMAL HIGH (ref 70–99)
Glucose-Capillary: 118 mg/dL — ABNORMAL HIGH (ref 70–99)
Glucose-Capillary: 142 mg/dL — ABNORMAL HIGH (ref 70–99)

## 2019-06-20 MED ORDER — METRONIDAZOLE 500 MG PO TABS
500.0000 mg | ORAL_TABLET | Freq: Three times a day (TID) | ORAL | Status: DC
  Administered 2019-06-20 – 2019-06-28 (×22): 500 mg via ORAL
  Filled 2019-06-20 (×23): qty 1

## 2019-06-20 MED ORDER — BENZONATATE 100 MG PO CAPS
100.0000 mg | ORAL_CAPSULE | Freq: Two times a day (BID) | ORAL | Status: DC | PRN
  Administered 2019-06-20 – 2019-06-22 (×5): 100 mg via ORAL
  Filled 2019-06-20 (×5): qty 1

## 2019-06-20 MED ORDER — POTASSIUM CHLORIDE CRYS ER 20 MEQ PO TBCR
20.0000 meq | EXTENDED_RELEASE_TABLET | Freq: Once | ORAL | Status: AC
  Administered 2019-06-20: 20 meq via ORAL
  Filled 2019-06-20: qty 1

## 2019-06-20 MED ORDER — PANTOPRAZOLE SODIUM 40 MG PO TBEC
40.0000 mg | DELAYED_RELEASE_TABLET | Freq: Every day | ORAL | Status: DC
  Administered 2019-06-20 – 2019-06-30 (×11): 40 mg via ORAL
  Filled 2019-06-20 (×11): qty 1

## 2019-06-20 NOTE — Progress Notes (Signed)
[facility]  PROGRESS NOTE  [name] HFW:[id number] DOB: [birth year] DOA: 06/11/2019 PCP: Christain Sacramento, MD   Brief HPI:   60 year old female with history of prior sigmoid diverticulitis with perforation in 2014, history of ILD on chronic suppression, diabetes mellitus type 2 on Metformin, obesity, hypertension came to ED with complaints of abdominal pain.  In the ED CT abdomen pelvis showed sigmoid diverticulitis.  Patient was started on IV Zosyn.  Hospitalist was consulted for admission.    Subjective   Patient seen and examined, drinking contrast for CT abdomen pelvis to be done today.  Renal function is slowly improving.  Making urine.    Assessment/Plan:     1. Acute sigmoid diverticulitis-patient initially presented with sigmoid vasculitis seen on CT.  As patient had worsening symptoms repeat CT was done which showed possibility of perforation without abscess collection.  General surgery was recommended who recommended IR for drain placement.  Patient had drain placement on 06/13/2019 with purulent material expressed.  WBC is 14,000.  She was initially on IV Zosyn however was changed to Rocephin and Flagyl.  ID and general surgery following.  CT abdomen pelvis ordered per general surgery.  Not done yesterday.  Will be done today.  Will follow CT results.  Blood culture growing gram-variable rods.  Final result is pending 2. Acute kidney injury-suspected ATN, urine presentation showed no blood or protein.  Likely induced by hypotension, contrast-induced nephropathy and Entresto.  Nephrology following.  Patient was started on hemodialysis for uremic symptoms.  Creatinine slowly improving, today creatinine 4.48.  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  She is on maintenance with CellCept and methotrexate which are currently on hold secondary to sepsis.  Previously treated with corticosteroids/Plex and  rituximab. 3. Hypertension-hemoglobin A1c 7.0.  Oral hypo-glycemic agents on hold.  Continue sliding scale insulin with NovoLog. 4. Hypokalemia-potassium is 3.3, will replace potassium and follow BMP in a.m. 5. Hypertension-Entresto has been stopped.  Patient was taking Coreg at home.  Currently she is on IV labetalol 20 mg IV every 2 hours as needed for SBP more than 160.  Blood pressure is stable. 6. Morbid obesity    SpO2: 94 % O2 Flow Rate (L/min): 2 L/min   COVID-19 Labs  No results for input(s): DDIMER, FERRITIN, LDH, CRP in the last 72 hours.  Lab Results  Component Value Date   [place] NEGATIVE 06/11/2019   [place] NEGATIVE 05/30/2019     CBG: Recent Labs  Lab 06/19/19 1801 06/19/19 2019 06/20/19 0012 06/20/19 0425 06/20/19 1140  GLUCAP 159* 160* 142* 115* 118*    CBC: Recent Labs  Lab 06/15/19 0710 06/16/19 0419 06/17/19 0221 06/18/19 0446 06/19/19 0446  WBC 14.0* 12.2* 13.0* 13.2* 14.3*  NEUTROABS 11.6* 9.4*  --   --   --   HGB 8.8* 8.9* 9.3* 9.5* 9.7*  HCT 29.4* 30.4* 29.7* 30.5* 31.8*  MCV 87.8 89.4 85.3 84.7 84.8  PLT 272 256 275 280 027    Basic Metabolic Panel: Recent Labs  Lab 06/14/19 0121 06/14/19 0121 06/15/19 0710 06/15/19 0710 06/16/19 0419 06/17/19 0221 06/18/19 0446 06/19/19 0446 06/20/19 1104  NA 135   < > 139   < > 136 134* 134* 135 137  K 4.1   < > 4.5   < > 4.4 4.7 3.6 3.3* 3.3*  CL 102   < > 103   < > 104 102 101 101 101  CO2 18*   < >  18*   < > 15* 13* 19* 20* 23  GLUCOSE 101*   < > 82   < > 102* 86 109* 111* 138*  BUN 53*   < > 73*   < > 82* 91* 62* 40* 25*  CREATININE 5.75*   < > 7.53*   < > 8.01* 9.31* 7.63* 5.70* 4.48*  CALCIUM 7.4*   < > 7.5*   < > 7.4* 7.6* 7.8* 8.0* 8.2*  MG  --   --   --   --   --   --  2.1  --   --   PHOS 4.8*  --  6.7*  --  7.5*  --   --   --  4.9*   < > = values in this interval not displayed.     Liver Function Tests: Recent Labs  Lab 06/15/19 0710 06/16/19 0419  06/17/19 0221 06/18/19 0446 06/20/19 1104  AST  --   --  29 38  --   ALT  --   --  22 22  --   ALKPHOS  --   --  100 102  --   BILITOT  --   --  0.3 0.5  --   PROT  --   --  5.8* 5.7*  --   ALBUMIN 2.3* 2.3* 2.4* 2.2* 2.4*        DVT prophylaxis: Lovenox  Code Status: Full code  Family Communication: No family at bedside    Status is: Inpatient  Dispo: The patient is from: Home              Anticipated d/c is to: Home              Anticipated d/c date is: 06/25/2019              Patient currently not medically stable for discharge.  Barrier to discharge-acute kidney injury, sigmoid diverticulitis, patient on dialysis        Scheduled medications:  . buPROPion  450 mg Oral Daily  . Chlorhexidine Gluconate Cloth  6 each Topical Daily  . citalopram  40 mg Oral Daily  . enoxaparin (LOVENOX) injection  30 mg Subcutaneous Q24H  . feeding supplement (NEPRO CARB STEADY)  237 mL Oral BID BM  . insulin aspart  0-9 Units Subcutaneous Q4H  . metroNIDAZOLE  500 mg Oral Q8H  . pantoprazole  40 mg Oral Daily  . saccharomyces boulardii  250 mg Oral BID  . sodium chloride flush  10-40 mL Intracatheter Q12H  . sodium chloride flush  5 mL Intracatheter Q8H    Consultants:  Nephrology  ID  IR  General surgery  Procedures:    Antibiotics:   Anti-infectives (From admission, onward)   Start     Dose/Rate Route Frequency Ordered Stop   06/20/19 1400  metroNIDAZOLE (FLAGYL) tablet 500 mg     Discontinue     500 mg Oral Every 8 hours 06/20/19 0928     06/14/19 1330  cefTRIAXone (ROCEPHIN) 2 g in sodium chloride 0.9 % 100 mL IVPB     Discontinue     2 g 200 mL/hr over 30 Minutes Intravenous Every 24 hours 06/14/19 1316     06/14/19 1330  metroNIDAZOLE (FLAGYL) IVPB 500 mg  Status:  Discontinued        500 mg 100 mL/hr over 60 Minutes Intravenous Every 8 hours 06/14/19 1316 06/20/19 0928   06/13/19 2200  piperacillin-tazobactam (ZOSYN) IVPB 3.375 g  Status:  Discontinued        3.375 g 12.5 mL/hr over 240 Minutes Intravenous Every 12 hours 06/13/19 1111 06/14/19 1316   06/11/19 2330  piperacillin-tazobactam (ZOSYN) IVPB 3.375 g  Status:  Discontinued        3.375 g 12.5 mL/hr over 240 Minutes Intravenous Every 8 hours 06/11/19 1852 06/13/19 1111   06/11/19 1715  piperacillin-tazobactam (ZOSYN) IVPB 3.375 g        3.375 g 100 mL/hr over 30 Minutes Intravenous  Once 06/11/19 1714 06/11/19 1942       Objective   Vitals:   06/20/19 0708 06/20/19 0758 06/20/19 0800 06/20/19 1143  BP:  91/78 133/78 123/71  Pulse:  85 88 83  Resp:  17 15 16   Temp: 98.3 F (36.8 C) 98.4 F (36.9 C) 98.4 F (36.9 C) 98 F (36.7 C)  TempSrc: Oral Oral Oral Oral  SpO2:  94% 94% 94%  Weight:      Height:        Intake/Output Summary (Last 24 hours) at 06/20/2019 1338 Last data filed at 06/20/2019 0920 Gross per 24 hour  Intake --  Output 390 ml  Net -390 ml    06/09 1901 - 06/11 0700 In: -  Out: 0347 [Urine:750; Drains:100]  Filed Weights   06/19/19 0422 06/19/19 0720 06/19/19 1059  Weight: (!) 180.5 kg (!) 180.1 kg (!) 176.6 kg    Physical Examination:    General-appears in no acute distress  Heart-S1-S2, regular, no murmur auscultated  Lungs-clear to auscultation bilaterally, no wheezing or crackles auscultated  Abdomen-soft, nontender, no organomegaly  Extremities-trace edema in the lower extremities  Neuro-alert, oriented x3, no focal deficit noted    Data Reviewed:   Recent Results (from the past 240 hour(s))  SARS Coronavirus 2 by RT PCR (hospital order, performed in [facility] hospital lab) Nasopharyngeal Nasopharyngeal Swab     Status: None   Collection Time: 06/11/19  6:16 PM   Specimen: Nasopharyngeal Swab  Result Value Ref Range Status   SARS Coronavirus 2 NEGATIVE NEGATIVE Final    Comment: (NOTE) SARS-CoV-2 target nucleic acids are NOT DETECTED. The SARS-CoV-2 RNA is generally detectable in upper and  lower respiratory specimens during the acute phase of infection. The lowest concentration of SARS-CoV-2 viral copies this assay can detect is 250 copies / mL. A negative result does not preclude SARS-CoV-2 infection and should not be used as the sole basis for treatment or other patient management decisions.  A negative result may occur with improper specimen collection / handling, submission of specimen other than nasopharyngeal swab, presence of viral mutation(s) within the areas targeted by this assay, and inadequate number of viral copies (<250 copies / mL). A negative result must be combined with clinical observations, patient history, and epidemiological information. Fact Sheet for Patients:   StrictlyIdeas.no Fact Sheet for Healthcare Providers: BankingDealers.co.za This test is not yet approved or cleared  by the [place] FDA and has been authorized for detection and/or diagnosis of SARS-CoV-2 by FDA under an Emergency Use Authorization (EUA).  This EUA will remain in effect (meaning this test can be used) for the duration of the COVID-19 declaration under Section 564(b)(1) of the Act, 21 U.S.C. section 360bbb-3(b)(1), unless the authorization is terminated or revoked sooner. Performed at [facility], [place] [address], [place],  [address]   Culture, blood (Routine X 2) w Reflex to ID Panel     Status: Abnormal   Collection Time: 06/11/19  8:20 PM  Specimen: BLOOD  Result Value Ref Range Status   Specimen Description BLOOD BLOOD LEFT FOREARM  Final   Special Requests   Final    BOTTLES DRAWN AEROBIC AND ANAEROBIC Blood Culture results may not be optimal due to an inadequate volume of blood received in culture bottles   Culture  Setup Time (A)  Final    GRAM VARIABLE ROD ANAEROBIC BOTTLE ONLY CRITICAL RESULT CALLED TO, READ BACK BY AND VERIFIED WITH: PHARMD J FRENS AT 0815 06/16/19 BY L BENFIELD    Culture (A)   Final    BACTEROIDES SPECIES BETA LACTAMASE NEGATIVE Performed at [facility], [address], [place], [place] [address]    Report Status 06/20/2019 FINAL  Final  Blood Culture ID Panel (Reflexed)     Status: None   Collection Time: 06/11/19  8:20 PM  Result Value Ref Range Status   Enterococcus species NOT DETECTED NOT DETECTED Final   Listeria monocytogenes NOT DETECTED NOT DETECTED Final   Staphylococcus species NOT DETECTED NOT DETECTED Final   Staphylococcus aureus (BCID) NOT DETECTED NOT DETECTED Final   Streptococcus species NOT DETECTED NOT DETECTED Final   Streptococcus agalactiae NOT DETECTED NOT DETECTED Final   Streptococcus pneumoniae NOT DETECTED NOT DETECTED Final   Streptococcus pyogenes NOT DETECTED NOT DETECTED Final   Acinetobacter baumannii NOT DETECTED NOT DETECTED Final   Enterobacteriaceae species NOT DETECTED NOT DETECTED Final   Enterobacter cloacae complex NOT DETECTED NOT DETECTED Final   Escherichia coli NOT DETECTED NOT DETECTED Final   Klebsiella oxytoca NOT DETECTED NOT DETECTED Final   Klebsiella pneumoniae NOT DETECTED NOT DETECTED Final   Proteus species NOT DETECTED NOT DETECTED Final   Serratia marcescens NOT DETECTED NOT DETECTED Final   Haemophilus influenzae NOT DETECTED NOT DETECTED Final   Neisseria meningitidis NOT DETECTED NOT DETECTED Final   Pseudomonas aeruginosa NOT DETECTED NOT DETECTED Final   Candida albicans NOT DETECTED NOT DETECTED Final   Candida glabrata NOT DETECTED NOT DETECTED Final   Candida krusei NOT DETECTED NOT DETECTED Final   Candida parapsilosis NOT DETECTED NOT DETECTED Final   Candida tropicalis NOT DETECTED NOT DETECTED Final    Comment: Performed at [facility], [address] [place], [place] [address]  Culture, blood (Routine X 2) w Reflex to ID Panel     Status: None   Collection Time: 06/11/19 10:00 PM   Specimen: BLOOD  Result Value Ref Range Status   Specimen Description BLOOD RIGHT HAND   Final   Special Requests   Final    BOTTLES DRAWN AEROBIC ONLY Blood Culture results may not be optimal due to an inadequate volume of blood received in culture bottles   Culture   Final    NO GROWTH 5 DAYS Performed at [facility], [place] [address], [place], [place] [address]    Report Status 06/16/2019 FINAL  Final  MRSA PCR Screening     Status: None   Collection Time: 06/15/19  9:26 AM   Specimen: Nasopharyngeal  Result Value Ref Range Status   MRSA by PCR NEGATIVE NEGATIVE Final    Comment:        The GeneXpert MRSA Assay (FDA approved for NASAL specimens only), is one component of a comprehensive MRSA colonization surveillance program. It is not intended to diagnose MRSA infection nor to guide or monitor treatment for MRSA infections. Performed at [facility], [place] [address] [place], [place] [address]  Oswald Hillock   [facility] If 7PM-7AM, please contact night-coverage at [email or web address], Office  [phone]   06/20/2019, 1:38 PM  LOS: 9 days

## 2019-06-20 NOTE — Progress Notes (Signed)
IR requested by Alferd Apa, PA-C for possible drain upsize. Patient with history of perforated diverticulitis with associated abscess s/p RLQ drain placement in IR 06/13/2019 by Dr. Vernard Gambles.  Discussed case with Dr. Laurence Ferrari who recommends (due to IR scheduling) further evaluation of case by IR on Monday 06/23/2019. Will keep patient on our radar until then. Will make Alferd Apa, PA-C aware.  Please call IR with questions/concerns.   Bea Graff Eula Mazzola, PA-C 06/20/2019, 4:24 PM

## 2019-06-20 NOTE — Progress Notes (Signed)
Nutrition Follow-up  DOCUMENTATION CODES:   Morbid obesity  INTERVENTION:  Continue Nepro Shake po BID, each supplement provides 425 kcal and 19 grams protein.  Encourage adequate PO intake.   NUTRITION DIAGNOSIS:   Increased nutrient needs related to acute illness (AKI) as evidenced by estimated needs; ongoing  GOAL:   Patient will meet greater than or equal to 90% of their needs; met  MONITOR:   PO intake, Supplement acceptance, Skin, Weight trends, Labs, I & O's, Diet advancement  REASON FOR ASSESSMENT:   Consult Assessment of nutrition requirement/status  ASSESSMENT:   59 y.o. 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 CT abd/pelvis was performed, which was notable for sigmoid diverticulitis. S/p perc drain to RLQ on 6/4 for diverticular abscess. RT IJ temp HD cath placed 6/7.   Pt underwent HD yesterday. Meal completion has been 90%. Pt has been tolerating her PO diet. Pt currently has Nepro shake ordered and has been consuming them. RD to continue with current orders to aid in caloric and protein needs.    Labs and medications reviewed.   Diet Order:   Diet Order            DIET SOFT Room service appropriate? Yes; Fluid consistency: Thin  Diet effective now                 EDUCATION NEEDS:   Not appropriate for education at this time  Skin:  Skin Assessment: Reviewed RN Assessment  Last BM:  6/11  Height:   Ht Readings from Last 1 Encounters:  06/11/19 _0  (1.727 m)    Weight:   Wt Readings from Last 1 Encounters:  06/19/19 (!) 176.6 kg    BMI:  Body mass index is 59.2 kg/m.  Estimated Nutritional Needs:   Kcal:  2000-2300  Protein:  100-120 grams  Fluid:  >/= 2 L/day   Corrin Parker, MS, RD, LDN RD pager number/after hours weekend pager number on Amion.

## 2019-06-20 NOTE — Progress Notes (Signed)
[facility] Progress Note     Subjective: CC-  Sitting up in bed eating breakfast. Continues to have intermittent right sided abdominal pain and nausea. No emesis. Tolerating diet. BM yesterday. CT scan pending.  Objective: Vital signs in last 24 hours: Temp:  [98 F (36.7 C)-98.6 F (37 C)] 98.4 F (36.9 C) (06/11 0800) Pulse Rate:  [82-91] 88 (06/11 0800) Resp:  [14-19] 15 (06/11 0800) BP: (91-159)/(57-82) 133/78 (06/11 0800) SpO2:  [92 %-95 %] 94 % (06/11 0800) Weight:  [176.6 kg] 176.6 kg (06/10 1059) Last BM Date: 06/19/19  Intake/Output from previous day: 06/10 0701 - 06/11 0700 In: -  Out: 2656 [Urine:150; Drains:100] Intake/Output this shift: No intake/output data recorded.  PE: Gen: Alert, NAD Pulm: rate and effort normal IRJ:JOACZ, soft,+BS,RLQ drain withair andfeculent fluid in bag, TTP around drain and RUQ,otherwise nontender   Lab Results:  Recent Labs    06/18/19 0446 06/19/19 0446  WBC 13.2* 14.3*  HGB 9.5* 9.7*  HCT 30.5* 31.8*  PLT 280 306   BMET Recent Labs    06/18/19 0446 06/19/19 0446  NA 134* 135  K 3.6 3.3*  CL 101 101  CO2 19* 20*  GLUCOSE 109* 111*  BUN 62* 40*  CREATININE 7.63* 5.70*  CALCIUM 7.8* 8.0*   PT/INR No results for input(s): LABPROT, INR in the last 72 hours. CMP     Component Value Date/Time   NA 135 06/19/2019 0446   NA 139 12/24/2017 1641   K 3.3 (L) 06/19/2019 0446   CL 101 06/19/2019 0446   CO2 20 (L) 06/19/2019 0446   GLUCOSE 111 (H) 06/19/2019 0446   BUN 40 (H) 06/19/2019 0446   BUN 15 12/24/2017 1641   CREATININE 5.70 (H) 06/19/2019 0446   CALCIUM 8.0 (L) 06/19/2019 0446   PROT 5.7 (L) 06/18/2019 0446   ALBUMIN 2.2 (L) 06/18/2019 0446   AST 38 06/18/2019 0446   ALT 22 06/18/2019 0446   ALKPHOS 102 06/18/2019 0446   BILITOT 0.5 06/18/2019 0446   GFRNONAA 8 (L) 06/19/2019 0446   GFRAA 9 (L) 06/19/2019 0446   Lipase     Component Value Date/Time   LIPASE 15 06/11/2019  1243       Studies/Results: No results found.  Anti-infectives: Anti-infectives (From admission, onward)   Start     Dose/Rate Route Frequency Ordered Stop   06/14/19 1330  cefTRIAXone (ROCEPHIN) 2 g in sodium chloride 0.9 % 100 mL IVPB     Discontinue     2 g 200 mL/hr over 30 Minutes Intravenous Every 24 hours 06/14/19 1316     06/14/19 1330  metroNIDAZOLE (FLAGYL) IVPB 500 mg     Discontinue     500 mg 100 mL/hr over 60 Minutes Intravenous Every 8 hours 06/14/19 1316     06/13/19 2200  piperacillin-tazobactam (ZOSYN) IVPB 3.375 g  Status:  Discontinued        3.375 g 12.5 mL/hr over 240 Minutes Intravenous Every 12 hours 06/13/19 1111 06/14/19 1316   06/11/19 2330  piperacillin-tazobactam (ZOSYN) IVPB 3.375 g  Status:  Discontinued        3.375 g 12.5 mL/hr over 240 Minutes Intravenous Every 8 hours 06/11/19 1852 06/13/19 1111   06/11/19 1715  piperacillin-tazobactam (ZOSYN) IVPB 3.375 g        3.375 g 100 mL/hr over 30 Minutes Intravenous  Once 06/11/19 1714 06/11/19 1942       Assessment/Plan ANCA-associated vasculitis ILD for which she is on chronic  immunosuppression DM HTN Cardiomyopathy Morbid obesity BMI 58.39 AKI/ATN- producing a small amount of urine. Currently undergoing HD.Nephrology following Urinary Retention - Foley placed 6/4 Malnutrition - prealbumin 6.7  Acute sigmoid diverticulitis with perforationand abscess - 2nd bout,history of diverticulitis in 2014 - last colonoscopy >10 years ago - CT scan6/3showedprogression of acute sigmoid diverticulitis now with evidence of perforation, increasing fluid in the right lower quadrant with gas fluid level, no rim enhancement to suggest organized fluid collection or abscess at this time - S/p IR drain on 6/4, cx pending?  - Continue abx. Per ID (switched to rocephin/flagyl due to worsening renal failure) - Continue PT,  mobilize.  ID- zosyn 6/2-6/5, rocephin/flagyl 6/5>> FEN -IVF,soft VTE -SCDs, lovenox Foley -in place Follow up -TBD  Plan: CT scan not done yesterday, will call radiology to make sure it happens today. Continue IV antibiotics and drain.   LOS: 9 days    [facility] 06/20/2019, 8:38 AM Please see Amion for pager number during day hours 7:00am-4:30pm

## 2019-06-20 NOTE — Progress Notes (Signed)
Physical Therapy Treatment Patient Details Name: [name] MRN: [id number] DOB: [birth year] Today's Date: 06/20/2019    History of Present Illness 59 y.o. 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 She presented to the ED with complaints of marked lower quadrant pain. Pt admitted for sigmoid diverticulitis with perforation and abscess. Drain placed 6/4.    PT Comments    Pt appeared more stable on her feet and required less assist for bed to chair transfer (now in a bariatric air bed).  She continues to have incontinent stool episodes as she reports it comes to quickly to call for help to the [facility].  She was agreeable for OOB to chair, but was too nauseated to progress gait today.  She was left sitting in the recliner attempting to finish drinking her contrast. PT will continue to follow acutely for safe mobility progression.   Follow Up Recommendations  Home health PT;Supervision/Assistance - 24 hour     Equipment Recommendations  None recommended by PT    Recommendations for Other Services   NA     Precautions / Restrictions Precautions Precautions: Fall;Other (comment) Precaution Comments: often incontinent of bowels Required Braces or Orthoses: Other Brace Other Brace: R LQ drain    Mobility  Bed Mobility Overal bed mobility: Needs Assistance Bed Mobility: Rolling;Sidelying to Sit Rolling: Min assist Sidelying to sit: Min assist       General bed mobility comments: Min hand held assist to pull to sidelying to reach the rail and then pull up to sitting with one hand on rail and free hand pulling against therapist while bed is being deflated.  Significant extra time spent with RN tech rolling side to side for pericare due to incontinent bowels.  Pt reports by the time she knows she needs to go, she is already actively going (she does not have  time to hit the call button and get up to the [facility]).   Transfers Overall transfer level: Needs assistance Equipment used: Rolling walker (2 wheeled) Transfers: Sit to/from [facility] Sit to Stand: Min assist Stand pivot transfers: Min assist       General transfer comment: Min assist with RW from elevated air bed, pt more steady today, able to take one hand off of RW momentariliy to adjust gown.  Min assist for pivotal steps to the chair.  Pt did not want to walk as she is very nauseated and trying to finish her contrast for her scan later today.          Balance Overall balance assessment: Needs assistance Sitting-balance support: Feet supported;No upper extremity supported Sitting balance-Leahy Scale: Fair Sitting balance - Comments: bil UE supported on compliant air mattress   Standing balance support: Bilateral upper extremity supported;Single extremity supported Standing balance-Leahy Scale: Poor Standing balance comment: reliant on at least one UE supported on RW                            Cognition Arousal/Alertness: Awake/alert Behavior During Therapy: [facility] for tasks assessed/performed Overall Cognitive Status: No family/caregiver present to determine baseline cognitive functioning  Pertinent Vitals/Pain Pain Assessment: Faces Faces Pain Scale: Hurts whole lot Pain Location: abdomen (R lower quadrant) Pain Descriptors / Indicators: Cramping;Sharp Pain Intervention(s): Limited activity within patient's tolerance;Monitored during session;Repositioned       Prior Function            PT Goals (current goals can now be found in the care plan section) Acute Rehab PT Goals Patient Stated Goal: home Progress towards PT goals: Progressing toward goals    Frequency    Min 3X/week      PT Plan Current plan remains appropriate       AM-PAC PT "6 Clicks" Mobility    Outcome Measure  Help needed turning from your back to your side while in a flat bed without using bedrails?: A Little Help needed moving from lying on your back to sitting on the side of a flat bed without using bedrails?: A Little Help needed moving to and from a bed to a chair (including a wheelchair)?: A Little Help needed standing up from a chair using your arms (e.g., wheelchair or bedside chair)?: A Little Help needed to walk in hospital room?: A Little Help needed climbing 3-5 steps with a railing? : A Lot 6 Click Score: 17    End of Session   Activity Tolerance: Other (comment) (limited by nausea) Patient left: in chair;with call bell/phone within reach Nurse Communication: Mobility status PT Visit Diagnosis: Muscle weakness (generalized) (M62.81);Difficulty in walking, not elsewhere classified (R26.2)     Time: 5638-7564 PT Time Calculation (min) (ACUTE ONLY): 26 min  Charges:  $Therapeutic Activity: 23-37 mins                    Verdene Lennert, PT, DPT  Acute Rehabilitation [phone] pager #[phone] office     06/20/2019, 11:46 AM

## 2019-06-20 NOTE — Progress Notes (Signed)
[facility] NEPHROLOGY PROGRESS NOTE  Assessment/ Plan: Pt is a 58 y.o. 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  #Acute kidney injury likely ischemic ATN due to hypotension, entresto, infection and contrast nephropathy.The urinalysis and serum creatinine level was normal on admission therefore no GN (h/o ANCA vasculitis).  CT scan ruled out obstruction. Received dialysis treatment for the management of severe azotemia and excess volume.  The last dialysis was yesterday with 2.4 L of ultrafiltration.  Plan to continue to monitor for renal recovery.  Strict ins and outs and daily BMP.  Hold off on dialysis today.  #Metabolic acidosis due to AKI: Now managed with dialysis.  Serum CO2 level acceptable today.  #Sepsis due to perforated diverticulitis/intra-abdominal infection: Seen by general surgery thought to be very high risk for surgical intervention.  Status post right lower quadrant drain placed by IR on 6/4.    Antibiotics per primary team.  She had a repeat CT scan of abdomen pelvis today.  #History of ANCA vasculitis/microscopic polyangiitis: Reportedly she was treated with steroids, plasmapheresis, rituximab in the past.  She is currently on CellCept and methotrexate which is on hold because of sepsis and AKI.  She follows with rheumatology.  #Hypotension:  Entresto on hold. Monitor blood pressure.  Blood pressure acceptable.  #Hypokalemia: Getting potassium chloride repletion.  Daily lab monitor.   Subjective: Seen and examined at bedside.  Came back from CT scan.  Denies nausea vomiting chest pain shortness of breath.  Some discomfort in her  abdomen. Objective Vital signs in last 24 hours: Vitals:   06/20/19 0708 06/20/19 0758 06/20/19 0800 06/20/19 1143  BP:  91/78 133/78 123/71  Pulse:  85 88 83  Resp:  17 15 16   Temp: 98.3 F (36.8 C) 98.4 F (36.9 C) 98.4 F (36.9 C) 98 F (36.7 C)  TempSrc: Oral Oral Oral Oral  SpO2:  94% 94% 94%  Weight:      Height:       Weight change: -0.6 kg  Intake/Output Summary (Last 24 hours) at 06/20/2019 1339 Last data filed at 06/20/2019 0920 Gross per 24 hour  Intake --  Output 390 ml  Net -390 ml       Labs: Basic Metabolic Panel: Recent Labs  Lab 06/15/19 0710 06/15/19 0710 06/16/19 0419 06/17/19 0221 06/18/19 0446 06/19/19 0446 06/20/19 1104  NA 139   < > 136   < > 134* 135 137  K 4.5   < > 4.4   < > 3.6 3.3* 3.3*  CL 103   < > 104   < > 101 101 101  CO2 18*   < > 15*   < > 19* 20* 23  GLUCOSE 82   < > 102*   < > 109* 111* 138*  BUN 73*   < > 82*   < > 62* 40* 25*  CREATININE 7.53*   < > 8.01*   < > 7.63* 5.70* 4.48*  CALCIUM 7.5*   < > 7.4*   < > 7.8* 8.0* 8.2*  PHOS 6.7*  --  7.5*  --   --   --  4.9*   < > = values in this interval not displayed.  Liver Function Tests: Recent Labs  Lab 06/17/19 0221 06/18/19 0446 06/20/19 1104  AST 29 38  --   ALT 22 22  --   ALKPHOS 100 102  --   BILITOT 0.3 0.5  --   PROT 5.8* 5.7*  --   ALBUMIN 2.4* 2.2* 2.4*   No results for input(s): LIPASE, AMYLASE in the last 168 hours. No results for input(s): AMMONIA in the last 168 hours. CBC: Recent Labs  Lab 06/15/19 0710 06/15/19 0710 06/16/19 0419 06/16/19 0419 06/17/19 0221 06/18/19 0446 06/19/19 0446  WBC 14.0*   < > 12.2*   < > 13.0* 13.2* 14.3*  NEUTROABS 11.6*  --  9.4*  --   --   --   --   HGB 8.8*   < > 8.9*   < > 9.3* 9.5* 9.7*  HCT 29.4*   < > 30.4*   < > 29.7* 30.5* 31.8*  MCV 87.8  --  89.4  --  85.3 84.7 84.8  PLT 272   < > 256   < > 275 280 306   < > = values in this interval not displayed.   Cardiac Enzymes: No results for input(s):  CKTOTAL, CKMB, CKMBINDEX, TROPONINI in the last 168 hours. CBG: Recent Labs  Lab 06/19/19 1801 06/19/19 2019 06/20/19 0012 06/20/19 0425 06/20/19 1140  GLUCAP 159* 160* 142* 115* 118*    Iron Studies: No results for input(s): IRON, TIBC, TRANSFERRIN, FERRITIN in the last 72 hours. Studies/Results: No results found.  Medications: Infusions: . cefTRIAXone (ROCEPHIN)  IV 2 g (06/20/19 1337)  . sodium chloride Stopped (06/12/19 0949)    Scheduled Medications: . buPROPion  450 mg Oral Daily  . Chlorhexidine Gluconate Cloth  6 each Topical Daily  . citalopram  40 mg Oral Daily  . enoxaparin (LOVENOX) injection  30 mg Subcutaneous Q24H  . feeding supplement (NEPRO CARB STEADY)  237 mL Oral BID BM  . insulin aspart  0-9 Units Subcutaneous Q4H  . metroNIDAZOLE  500 mg Oral Q8H  . pantoprazole  40 mg Oral Daily  . saccharomyces boulardii  250 mg Oral BID  . sodium chloride flush  10-40 mL Intracatheter Q12H  . sodium chloride flush  5 mL Intracatheter Q8H    have reviewed scheduled and prn medications.  Physical Exam: General:NAD, comfortable, able to lie flat Heart:RRR, s1s2 nl, no rubs Lungs: Basal decreased breath sound Abdomen:soft, non-distended, right lower quadrant drain Extremities: No LE edema Neurology: Alert awake and following commands, no asterixis  [name] Tanna Furry 06/20/2019,1:39 PM  LOS: 9 days  Pager: [phone]

## 2019-06-20 NOTE — Progress Notes (Signed)
Referring Physician(s): Margie Billet, PA-C  Supervising Physician: Jacqulynn Cadet  Patient Status:  [facility] - In-pt  Chief Complaint: Follow up RLQ diverticular abscess drain placed 06/13/19 by Dr. Vernard Gambles  Subjective: Patient states she feels like her pain is diminishing somewhat.   Allergies: Guaifenesin, Mucinex [guaifenesin er], and Sulfa antibiotics  Medications: Prior to Admission medications   Medication Sig Start Date End Date Taking? Authorizing Provider  ALPRAZolam Duanne Moron) 1 MG tablet Take 1 tablet (1 mg total) by mouth 3 (three) times daily as needed for anxiety. 08/22/13  Yes Blanchie Serve, MD  buPROPion (WELLBUTRIN XL) 150 MG 24 hr tablet Take 450 mg by mouth daily.   Yes [provider]  carvedilol (COREG) 12.5 MG tablet TAKE 1 TABLET BY MOUTH TWICE A DAY Patient taking differently: Take 12.5 mg by mouth 2 (two) times daily with a meal.  02/26/19  Yes Josue Hector, MD  citalopram (CELEXA) 40 MG tablet Take 40 mg by mouth daily.   Yes [provider]  clobetasol cream (TEMOVATE) 0.25 % Apply 1 application topically 2 (two) times daily as needed (paronychia).  04/08/19  Yes [provider]  ENTRESTO 49-51 MG TAKE 1 TABLET BY MOUTH TWICE A DAY Patient taking differently: Take 1 tablet by mouth 2 (two) times daily.  02/26/19  Yes Josue Hector, MD  esomeprazole (NEXIUM) 20 MG capsule Take 40 mg by mouth daily.    Yes [provider]  folic acid (FOLVITE) 1 MG tablet Take 1 mg by mouth daily.    Yes [provider]  gabapentin (NEURONTIN) 400 MG capsule Take 400 mg by mouth at bedtime as needed (pain).  04/08/19  Yes [provider]  meloxicam (MOBIC) 15 MG tablet Take 15 mg by mouth daily.  04/08/19  Yes [provider]  metFORMIN (GLUCOPHAGE-XR) 500 MG 24 hr tablet Take 500 mg by mouth daily with breakfast.  10/03/17  Yes [provider]  methotrexate (50 MG/ML) 1 g injection Inject into the vein once  a week.    Yes [provider]  Multiple Vitamin (MULTIVITAMIN) tablet Take 2 tablets by mouth daily.    Yes [provider]  mycophenolate (CELLCEPT) 500 MG tablet Take 1,500 mg by mouth 2 (two) times daily.  06/05/17  Yes [provider]  polyethylene glycol (MIRALAX / GLYCOLAX) packet Take 17 g by mouth daily as needed for moderate constipation (constipation).    Yes [provider]  dextromethorphan (DELSYM) 30 MG/5ML liquid Take 15 mg by mouth 2 (two) times daily as needed for cough.     [provider]  Fluticasone-Salmeterol (ADVAIR) 100-50 MCG/DOSE AEPB Inhale 1 puff into the lungs 2 (two) times daily as needed (asthma).     [provider]  oxyCODONE (OXY IR/ROXICODONE) 5 MG immediate release tablet Take 5 mg by mouth every 6 (six) hours as needed for pain. 05/28/19   [provider]  phentermine (ADIPEX-P) 37.5 MG tablet Take 37.5 mg by mouth daily. 04/06/18   [provider]  progesterone (PROMETRIUM) 200 MG capsule Take 200 mg by mouth at bedtime. 05/28/19   [provider]     Vital Signs: BP 123/71 (BP Location: Right Wrist)   Pulse 83   Temp 98 F (36.7 C) (Oral)   Resp 16   Ht 5\' 8"  (1.727 m)   Wt (!) 389 lb 5.3 oz (176.6 kg)   SpO2 94%   BMI 59.20 kg/m   Physical Exam Constitutional:  General: She is not in acute distress.    Appearance: Normal appearance. She is obese.  Pulmonary:     Effort: Pulmonary effort is normal.  Abdominal:     Palpations: Abdomen is soft.     Tenderness: There is abdominal tenderness.     Comments: RLQ tenderness around drain site. Small amount of gas and feculent material in gravity bag. Dressing is clean and dry.   Skin:    General: Skin is warm and dry.  Neurological:     Mental Status: She is alert.     Imaging: No results found.  Labs:  CBC: Recent Labs    06/16/19 0419 06/17/19 0221 06/18/19 0446 06/19/19 0446  WBC 12.2* 13.0* 13.2*  14.3*  HGB 8.9* 9.3* 9.5* 9.7*  HCT 30.4* 29.7* 30.5* 31.8*  PLT 256 275 280 306    COAGS: Recent Labs    06/13/19 1015  INR 1.3*    BMP: Recent Labs    06/17/19 0221 06/18/19 0446 06/19/19 0446 06/20/19 1104  NA 134* 134* 135 137  K 4.7 3.6 3.3* 3.3*  CL 102 101 101 101  CO2 13* 19* 20* 23  GLUCOSE 86 109* 111* 138*  BUN 91* 62* 40* 25*  CALCIUM 7.6* 7.8* 8.0* 8.2*  CREATININE 9.31* 7.63* 5.70* 4.48*  GFRNONAA 4* 5* 8* 10*  GFRAA 5* 6* 9* 12*    LIVER FUNCTION TESTS: Recent Labs    06/12/19 0209 06/12/19 0209 06/13/19 0321 06/14/19 0121 06/16/19 0419 06/17/19 0221 06/18/19 0446 06/20/19 1104  BILITOT 1.4*  --  1.1  --   --  0.3 0.5  --   AST 14*  --  21  --   --  29 38  --   ALT 12  --  15  --   --  22 22  --   ALKPHOS 89  --  94  --   --  100 102  --   PROT 5.9*  --  6.4*  --   --  5.8* 5.7*  --   ALBUMIN 2.9*   < > 2.9*   < > 2.3* 2.4* 2.2* 2.4*   < > = values in this interval not displayed.    Assessment and Plan:  Diverticular abscess status post RLQ drain: Patient is reporting slightly less pain than in the past few days. She states the pain is worse when she is having a bowel movement. Documented output in the past 24 hours is 140 ml.   CT abdomen/pelvis done today, results pending. IV antibiotics per primary team.   59 y/o F with history of diverticulitis and abscess formation s/p RLQ drain placement 06/13/19 in IR seen today for drain follow up.  Continue TID flushes with 5 cc NS, record output Qshift, dressing changes QD or PRN if soiled, call IR if difficulty flushing or sudden decrease in output.   Plan for repeat CT/possible injection once output <10 mL/QD (excluding flush) - if patient is to be discharged prior to this occurring they will need to follow up with IR clinic as an outpatient typically 10-14 days post d/c, IR scheduler will call patient with date/time. Upon d/c drain to be flushed QD with 5 cc NS (will need rx from primary team  at d/c), record output QD, dressing changes every 2-3 days or sooner if soiled.   IR will continue to follow - please call with questions or concerns.  Electronically Signed: Theresa Duty, NP 06/20/2019, 2:00 PM   I  spent a total of 15 Minutes at the the patient's bedside AND on the patient's hospital floor or unit, greater than 50% of which was counseling/coordinating care for RLQ diverticular abscess drain.

## 2019-06-21 LAB — RENAL FUNCTION PANEL
Albumin: 2.2 g/dL — ABNORMAL LOW (ref 3.5–5.0)
Anion gap: 14 (ref 5–15)
BUN: 27 mg/dL — ABNORMAL HIGH (ref 6–20)
CO2: 25 mmol/L (ref 22–32)
Calcium: 8 mg/dL — ABNORMAL LOW (ref 8.9–10.3)
Chloride: 100 mmol/L (ref 98–111)
Creatinine, Ser: 4.59 mg/dL — ABNORMAL HIGH (ref 0.44–1.00)
GFR calc Af Amer: 11 mL/min — ABNORMAL LOW (ref >60)
GFR calc non Af Amer: 10 mL/min — ABNORMAL LOW (ref >60)
Glucose, Bld: 103 mg/dL — ABNORMAL HIGH (ref 70–99)
Phosphorus: 5.9 mg/dL — ABNORMAL HIGH (ref 2.5–4.6)
Potassium: 3.2 mmol/L — ABNORMAL LOW (ref 3.5–5.1)
Sodium: 139 mmol/L (ref 135–145)

## 2019-06-21 LAB — CBC
HCT: 31.5 % — ABNORMAL LOW (ref 36.0–46.0)
Hemoglobin: 9.4 g/dL — ABNORMAL LOW (ref 12.0–15.0)
MCH: 25.9 pg — ABNORMAL LOW (ref 26.0–34.0)
MCHC: 29.8 g/dL — ABNORMAL LOW (ref 30.0–36.0)
MCV: 86.8 fL (ref 80.0–100.0)
Platelets: 271 10*3/uL (ref 150–400)
RBC: 3.63 MIL/uL — ABNORMAL LOW (ref 3.87–5.11)
RDW: 15 % (ref 11.5–15.5)
WBC: 15.2 10*3/uL — ABNORMAL HIGH (ref 4.0–10.5)
nRBC: 0 % (ref 0.0–0.2)

## 2019-06-21 LAB — GLUCOSE, CAPILLARY
Glucose-Capillary: 101 mg/dL — ABNORMAL HIGH (ref 70–99)
Glucose-Capillary: 102 mg/dL — ABNORMAL HIGH (ref 70–99)
Glucose-Capillary: 118 mg/dL — ABNORMAL HIGH (ref 70–99)
Glucose-Capillary: 119 mg/dL — ABNORMAL HIGH (ref 70–99)
Glucose-Capillary: 121 mg/dL — ABNORMAL HIGH (ref 70–99)
Glucose-Capillary: 125 mg/dL — ABNORMAL HIGH (ref 70–99)
Glucose-Capillary: 143 mg/dL — ABNORMAL HIGH (ref 70–99)
Glucose-Capillary: 177 mg/dL — ABNORMAL HIGH (ref 70–99)

## 2019-06-21 MED ORDER — POTASSIUM CHLORIDE CRYS ER 20 MEQ PO TBCR
40.0000 meq | EXTENDED_RELEASE_TABLET | Freq: Once | ORAL | Status: AC
  Administered 2019-06-21: 40 meq via ORAL
  Filled 2019-06-21: qty 2

## 2019-06-21 NOTE — Progress Notes (Addendum)
Referring Physician(s): Evette Cristal PA - Dr. Lyman Bishop (surgery)  Supervising Physician: Daryll Brod  Patient Status:  [facility] - In-pt  Chief Complaint:  Worsening abdominal pain found to have a RLQ /pelvic collection  Subjective:  59 y.o, female inpatient. History of prior sigmoid diverticulitis with perforation  2014, DM, HTN. Presented to the emergency department with worsening RLQ pain and constipation.  Patient was found to have leokocytosis, a RLQ fluid collection and  AKI.  IR placed a drain to the right pelvic fluid collection on 6.4.21. Follow up CT shows a larger collection. Patient alert and laying in bed, calm and comfortable. Denies any fevers, headache, chest pain, SOB, cough, abdominal pain, nausea, vomiting or bleeding. Patient endorses fatigue   Allergies: Guaifenesin, Mucinex [guaifenesin er], and Sulfa antibiotics  Medications: Prior to Admission medications   Medication Sig Start Date End Date Taking? Authorizing Provider  ALPRAZolam Duanne Moron) 1 MG tablet Take 1 tablet (1 mg total) by mouth 3 (three) times daily as needed for anxiety. 08/22/13  Yes Blanchie Serve, MD  buPROPion (WELLBUTRIN XL) 150 MG 24 hr tablet Take 450 mg by mouth daily.   Yes [provider]  carvedilol (COREG) 12.5 MG tablet TAKE 1 TABLET BY MOUTH TWICE A DAY Patient taking differently: Take 12.5 mg by mouth 2 (two) times daily with a meal.  02/26/19  Yes Josue Hector, MD  citalopram (CELEXA) 40 MG tablet Take 40 mg by mouth daily.   Yes [provider]  clobetasol cream (TEMOVATE) 6.23 % Apply 1 application topically 2 (two) times daily as needed (paronychia).  04/08/19  Yes [provider]  ENTRESTO 49-51 MG TAKE 1 TABLET BY MOUTH TWICE A DAY Patient taking differently: Take 1 tablet by mouth 2 (two) times daily.  02/26/19  Yes Josue Hector, MD  esomeprazole (NEXIUM) 20 MG capsule Take 40 mg by mouth daily.    Yes [provider]  folic acid (FOLVITE) 1  MG tablet Take 1 mg by mouth daily.    Yes [provider]  gabapentin (NEURONTIN) 400 MG capsule Take 400 mg by mouth at bedtime as needed (pain).  04/08/19  Yes [provider]  meloxicam (MOBIC) 15 MG tablet Take 15 mg by mouth daily.  04/08/19  Yes [provider]  metFORMIN (GLUCOPHAGE-XR) 500 MG 24 hr tablet Take 500 mg by mouth daily with breakfast.  10/03/17  Yes [provider]  methotrexate (50 MG/ML) 1 g injection Inject into the vein once a week.    Yes [provider]  Multiple Vitamin (MULTIVITAMIN) tablet Take 2 tablets by mouth daily.    Yes [provider]  mycophenolate (CELLCEPT) 500 MG tablet Take 1,500 mg by mouth 2 (two) times daily.  06/05/17  Yes [provider]  polyethylene glycol (MIRALAX / GLYCOLAX) packet Take 17 g by mouth daily as needed for moderate constipation (constipation).    Yes [provider]  dextromethorphan (DELSYM) 30 MG/5ML liquid Take 15 mg by mouth 2 (two) times daily as needed for cough.     [provider]  Fluticasone-Salmeterol (ADVAIR) 100-50 MCG/DOSE AEPB Inhale 1 puff into the lungs 2 (two) times daily as needed (asthma).     [provider]  oxyCODONE (OXY IR/ROXICODONE) 5 MG immediate release tablet Take 5 mg by mouth every 6 (six) hours as needed for pain. 05/28/19   [provider]  phentermine (ADIPEX-P) 37.5 MG tablet Take 37.5 mg by mouth daily. 04/06/18  [provider]  progesterone (PROMETRIUM) 200 MG capsule Take 200 mg by mouth at bedtime. 05/28/19   [provider]     Vital Signs: BP (!) 153/73 (BP Location: Right Wrist)   Pulse 83   Temp 98.2 F (36.8 C) (Oral)   Resp 16   Ht 5\' 8"  (1.727 m)   Wt (!) 389 lb 5.3 oz (176.6 kg)   SpO2 93%   BMI 59.20 kg/m   Physical Exam Vitals and nursing note reviewed.  Constitutional:      Appearance: She is well-developed.  HENT:     Head: Normocephalic and atraumatic.    Eyes:     Conjunctiva/sclera: Conjunctivae normal.  Pulmonary:     Effort: Pulmonary effort is normal.  Abdominal:     Comments: Positive RLQ abscess drain changed to suction.  Minimal fecal drainage noted at exit site. Suture and stat lock in place. Dressing is clean dry and intact. 15 ml of  feculant material noted gravity bag prior to changing. Drain is able to be flushed easily.    Musculoskeletal:        General: Normal range of motion.     Cervical back: Normal range of motion.  Skin:    General: Skin is warm.  Neurological:     Mental Status: She is alert and oriented to person, place, and time.     Imaging: CT ABDOMEN PELVIS WO CONTRAST  Result Date: 06/20/2019 CLINICAL DATA:  Diverticular abscess follow-up. EXAM: CT ABDOMEN AND PELVIS WITHOUT CONTRAST TECHNIQUE: Multidetector CT imaging of the abdomen and pelvis was performed following the standard protocol without IV contrast. COMPARISON:  CT abdomen pelvis dated June 12, 2019. FINDINGS: Lower chest: No acute abnormality. Hepatobiliary: No focal liver abnormality is seen. Unchanged hepatic steatosis. The gallbladder is decompressed. No biliary dilatation. Pancreas: Unremarkable. No pancreatic ductal dilatation or surrounding inflammatory changes. Spleen: Unchanged mild splenomegaly.  No focal abnormality. Adrenals/Urinary Tract: Adrenal glands are unremarkable. Kidneys are normal, without renal calculi, focal lesion, or hydronephrosis. Bladder is decompressed by a Foley catheter. Stomach/Bowel: Wall thickening of and inflammatory stranding surrounding the mid sigmoid colon have improved. New right lower quadrant pigtail drainage catheter within a now more organized gas and fluid collection in the right lower quadrant measuring 12.0 x 8.1 x 11.9 cm. This is increased in size when compared to the prior study. There is a new second smaller gas and fluid collection just superior to and separate from the bladder, measuring 5.0 x 3.8 cm  (series 3, image 83). The stomach and small bowel are unremarkable. Vascular/Lymphatic: No significant vascular findings are present. No enlarged abdominal or pelvic lymph nodes. Reproductive: Uterus and bilateral adnexa are unremarkable. Other: Trace free fluid in the pelvis. Small amount of subcutaneous emphysema in the right lower abdominal wall around the pigtail drainage catheter. Musculoskeletal: No acute or significant osseous findings. IMPRESSION: 1. New right lower quadrant pigtail drainage catheter within a now more organized gas and fluid collection in the right lower quadrant measuring 12.0 x 8.1 x 11.9 cm, larger in size when compared to the prior study. 2. New second smaller gas and fluid collection just superior to and separate from the bladder, measuring 5.0 x 3.8 cm. 3. Decreasing sigmoid inflammatory changes. 4. Unchanged hepatic steatosis. Electronically Signed   By: Titus Dubin M.D.   On: 06/20/2019 15:07    Labs:  CBC: Recent Labs    06/17/19 0221 06/18/19 0446 06/19/19 0446 06/21/19 0406  WBC 13.0* 13.2* 14.3* 15.2*  HGB 9.3* 9.5* 9.7* 9.4*  HCT 29.7* 30.5* 31.8* 31.5*  PLT 275 280 306 271    COAGS: Recent Labs    06/13/19 1015  INR 1.3*    BMP: Recent Labs    06/18/19 0446 06/19/19 0446 06/20/19 1104 06/21/19 0406  NA 134* 135 137 139  K 3.6 3.3* 3.3* 3.2*  CL 101 101 101 100  CO2 19* 20* 23 25  GLUCOSE 109* 111* 138* 103*  BUN 62* 40* 25* 27*  CALCIUM 7.8* 8.0* 8.2* 8.0*  CREATININE 7.63* 5.70* 4.48* 4.59*  GFRNONAA 5* 8* 10* 10*  GFRAA 6* 9* 12* 11*    LIVER FUNCTION TESTS: Recent Labs    06/12/19 0209 06/12/19 0209 06/13/19 0321 06/14/19 0121 06/17/19 0221 06/18/19 0446 06/20/19 1104 06/21/19 0406  BILITOT 1.4*  --  1.1  --  0.3 0.5  --   --   AST 14*  --  21  --  29 38  --   --   ALT 12  --  15  --  22 22  --   --   ALKPHOS 89  --  94  --  100 102  --   --   PROT 5.9*  --  6.4*  --  5.8* 5.7*  --   --   ALBUMIN 2.9*   < > 2.9*    < > 2.4* 2.2* 2.4* 2.2*   < > = values in this interval not displayed.    Assessment and Plan:  59 y.o, female inpatient. History of prior sigmoid diverticulitis with perforation, 2014, DM, HTN. Presented to the emergency department with worsening RLQ pain and constipation.  Patient was found to have leokocytosis, RLQ fluid collection and AKI.  IR placed a drain to the right pelvic fluid collection on 6.4.21. Follow up CT shows a larger collection.  Per Epic output is:  75 ml, 190 ml, 100 ml  Pertinent Imaging 6.11.21 - CT abd pelvis reads New right lower quadrant pigtail drainage catheter within a now more organized gas and fluid collection in the right lower quadrant measuring 12.0 x 8.1 x 11.9 cm, larger in size when compared to the prior study.   Pertinent IR History 6.7.21 - Temp dialysis catheter placement 6.4.21 - Placement of abscess drain right lower pelvis  Pertinent Allergies Sulfa  WBC is 15.2 (uptrending) All labs are within acceptable parameters. Patient is on subcutaneous prophylactic dose of lovenox. Patient is afebrile.  After discussion with IR Attending Dr. Annamaria Boots the patient was changed from a gravity bag to a JP drain. Recommend IR continue to evaluate for possible upsize as previously scheduled for 6.14.21    Electronically Signed: Avel Peace, NP 06/21/2019, 12:06 PM   I spent a total of 15 Minutes at the patient's bedside AND on the patient's hospital floor or unit, greater than 50% of which was counseling/coordinating care for RLQ abscess drain

## 2019-06-21 NOTE — Progress Notes (Signed)
[facility]  PROGRESS NOTE  [name] EXB:[id number] DOB: 1960/10/20 DOA: 06/11/2019 PCP: Christain Sacramento, MD   Brief HPI:   59 year old female with history of prior sigmoid diverticulitis with perforation in 2014, history of ILD on chronic suppression, diabetes mellitus type 2 on Metformin, obesity, hypertension came to ED with complaints of abdominal pain.  In the ED CT abdomen pelvis showed sigmoid diverticulitis.  Patient was started on IV Zosyn.  Hospitalist was consulted for admission.    Subjective   Patient seen and examined, CT abdomen pelvis shows new right lower quadrant pigtail drainage catheter within a new more organized gas and fluid collection in the right lower quadrant measuring 12 x 8.1 x 11.9 cm.  Large in size as compared to previous study.  IR has been reconsulted by general surgery.   Assessment/Plan:     1. Acute sigmoid diverticulitis-patient initially presented with sigmoid vasculitis seen on CT.  As patient had worsening symptoms repeat CT was done which showed possibility of perforation without abscess collection.  General surgery was recommended who recommended IR for drain placement.  Patient had drain placement on 06/13/2019 with purulent material expressed.  WBC is 15,000.  She was initially on IV Zosyn however was changed to Rocephin and Flagyl.  ID and general surgery following.  CT abdomen pelvis ordered per general surgery.  Shows large collection which is larger than previous CT scan.  IR reconsulted per general surgery.   Blood culture growing gram-variable rods.  Final result is pending 2. Acute kidney injury-suspected ATN, urine presentation showed no blood or protein.  Likely induced by hypotension, contrast-induced nephropathy and Entresto.  Nephrology following.  Patient was started on hemodialysis for uremic symptoms.  Creatinine slowly improving, today creatinine 4.48.  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  She is on maintenance with CellCept and methotrexate which are currently on hold secondary to sepsis.  Previously treated with corticosteroids/Plex and rituximab. 3. Hypertension-hemoglobin A1c 7.0.  Oral hypo-glycemic agents on hold.  Continue sliding scale insulin with NovoLog. 4. Hypokalemia-potassium is 3.2, replaced.  Follow BMP in a.m. 5. Hypertension-Entresto has been stopped.  Patient was taking Coreg at home.  Currently she is on IV labetalol 20 mg IV every 2 hours as needed for SBP more than 160.  Blood pressure is stable. 6. Morbid obesity    SpO2: 94 % O2 Flow Rate (L/min): 2 L/min   COVID-19 Labs  No results for input(s): DDIMER, FERRITIN, LDH, CRP in the last 72 hours.  Lab Results  Component Value Date   [place] NEGATIVE 06/11/2019   [place] NEGATIVE 05/30/2019     CBG: Recent Labs  Lab 06/20/19 0425 06/20/19 1140 06/20/19 1618 06/21/19 0046 06/21/19 0741  GLUCAP 115* 118* 113* 121* 102*    CBC: Recent Labs  Lab 06/15/19 0710 06/15/19 0710 06/16/19 0419 06/17/19 0221 06/18/19 0446 06/19/19 0446 06/21/19 0406  WBC 14.0*   < > 12.2* 13.0* 13.2* 14.3* 15.2*  NEUTROABS 11.6*  --  9.4*  --   --   --   --   HGB 8.8*   < > 8.9* 9.3* 9.5* 9.7* 9.4*  HCT 29.4*   < > 30.4* 29.7* 30.5* 31.8* 31.5*  MCV 87.8   < > 89.4 85.3 84.7 84.8 86.8  PLT 272   < > 256 275 280 306 271   < > = values in this interval not displayed.    Basic Metabolic Panel: Recent Labs  Lab 06/15/19 0710 06/15/19 0710 06/16/19 0419 06/16/19 0419 06/17/19 0221 06/18/19 0446 06/19/19 0446 06/20/19 1104 06/21/19 0406  NA 139   < > 136   < > 134* 134* 135 137 139  K 4.5   < > 4.4   < > 4.7 3.6 3.3* 3.3* 3.2*  CL 103   < > 104   < > 102 101 101 101 100  CO2 18*   < > 15*   < > 13* 19* 20* 23 25  GLUCOSE 82   < > 102*   < > 86 109* 111* 138* 103*  BUN 73*   < > 82*   < > 91* 62* 40* 25* 27*  CREATININE 7.53*   < > 8.01*   < > 9.31*  7.63* 5.70* 4.48* 4.59*  CALCIUM 7.5*   < > 7.4*   < > 7.6* 7.8* 8.0* 8.2* 8.0*  MG  --   --   --   --   --  2.1  --   --   --   PHOS 6.7*  --  7.5*  --   --   --   --  4.9* 5.9*   < > = values in this interval not displayed.     Liver Function Tests: Recent Labs  Lab 06/16/19 0419 06/17/19 0221 06/18/19 0446 06/20/19 1104 06/21/19 0406  AST  --  29 38  --   --   ALT  --  22 22  --   --   ALKPHOS  --  100 102  --   --   BILITOT  --  0.3 0.5  --   --   PROT  --  5.8* 5.7*  --   --   ALBUMIN 2.3* 2.4* 2.2* 2.4* 2.2*        DVT prophylaxis: Lovenox  Code Status: Full code  Family Communication: No family at bedside    Status is: Inpatient  Dispo: The patient is from: Home              Anticipated d/c is to: Home              Anticipated d/c date is: 06/25/2019              Patient currently not medically stable for discharge.  Barrier to discharge-acute kidney injury, sigmoid diverticulitis, patient on dialysis        Scheduled medications:  . buPROPion  450 mg Oral Daily  . Chlorhexidine Gluconate Cloth  6 each Topical Daily  . citalopram  40 mg Oral Daily  . enoxaparin (LOVENOX) injection  30 mg Subcutaneous Q24H  . feeding supplement (NEPRO CARB STEADY)  237 mL Oral BID BM  . insulin aspart  0-9 Units Subcutaneous Q4H  . metroNIDAZOLE  500 mg Oral Q8H  . pantoprazole  40 mg Oral Daily  . saccharomyces boulardii  250 mg Oral BID  . sodium chloride flush  10-40 mL Intracatheter Q12H  . sodium chloride flush  5 mL Intracatheter Q8H    Consultants:  Nephrology  ID  IR  General surgery  Procedures:    Antibiotics:   Anti-infectives (From admission, onward)   Start     Dose/Rate Route Frequency Ordered Stop   06/20/19 1400  metroNIDAZOLE (FLAGYL) tablet 500 mg     Discontinue     500 mg Oral Every 8 hours 06/20/19 0928     06/14/19 1330  cefTRIAXone (ROCEPHIN) 2 g in sodium chloride  0.9 % 100 mL IVPB     Discontinue     2 g 200 mL/hr  over 30 Minutes Intravenous Every 24 hours 06/14/19 1316     06/14/19 1330  metroNIDAZOLE (FLAGYL) IVPB 500 mg  Status:  Discontinued        500 mg 100 mL/hr over 60 Minutes Intravenous Every 8 hours 06/14/19 1316 06/20/19 0928   06/13/19 2200  piperacillin-tazobactam (ZOSYN) IVPB 3.375 g  Status:  Discontinued        3.375 g 12.5 mL/hr over 240 Minutes Intravenous Every 12 hours 06/13/19 1111 06/14/19 1316   06/11/19 2330  piperacillin-tazobactam (ZOSYN) IVPB 3.375 g  Status:  Discontinued        3.375 g 12.5 mL/hr over 240 Minutes Intravenous Every 8 hours 06/11/19 1852 06/13/19 1111   06/11/19 1715  piperacillin-tazobactam (ZOSYN) IVPB 3.375 g        3.375 g 100 mL/hr over 30 Minutes Intravenous  Once 06/11/19 1714 06/11/19 1942       Objective   Vitals:   06/21/19 0000 06/21/19 0401 06/21/19 0745 06/21/19 1149  BP: (!) 143/67 (!) 147/64 (!) 153/73 140/72  Pulse: 82 83 83 83  Resp: 14 18 16 16   Temp: 98.9 F (37.2 C) 98.3 F (36.8 C) 98.2 F (36.8 C) 97.9 F (36.6 C)  TempSrc: Oral Oral Oral Oral  SpO2: 93% 94% 93% 94%  Weight:      Height:        Intake/Output Summary (Last 24 hours) at 06/21/2019 1426 Last data filed at 06/21/2019 1346 Gross per 24 hour  Intake --  Output 1925 ml  Net -1925 ml    06/10 1901 - 06/12 0700 In: 100  Out: 1940 [EXHBZ:1696; Drains:290]  Filed Weights   06/19/19 0422 06/19/19 0720 06/19/19 1059  Weight: (!) 180.5 kg (!) 180.1 kg (!) 176.6 kg    Physical Examination:   General-appears in no acute distress  Heart-S1-S2, regular, no murmur auscultated  Lungs-clear to auscultation bilaterally, no wheezing or crackles auscultated  Abdomen-soft, nontender, no organomegaly, right lower quadrant tenderness to palpation, drain in place  Extremities-no edema in the lower extremities  Neuro-alert, oriented x3, no focal deficit noted    Data Reviewed:   Recent Results (from the past 240 hour(s))  SARS Coronavirus 2 by RT PCR  (hospital order, performed in [facility] hospital lab) Nasopharyngeal Nasopharyngeal Swab     Status: None   Collection Time: 06/11/19  6:16 PM   Specimen: Nasopharyngeal Swab  Result Value Ref Range Status   SARS Coronavirus 2 NEGATIVE NEGATIVE Final    Comment: (NOTE) SARS-CoV-2 target nucleic acids are NOT DETECTED. The SARS-CoV-2 RNA is generally detectable in upper and lower respiratory specimens during the acute phase of infection. The lowest concentration of SARS-CoV-2 viral copies this assay can detect is 250 copies / mL. A negative result does not preclude SARS-CoV-2 infection and should not be used as the sole basis for treatment or other patient management decisions.  A negative result may occur with improper specimen collection / handling, submission of specimen other than nasopharyngeal swab, presence of viral mutation(s) within the areas targeted by this assay, and inadequate number of viral copies (<250 copies / mL). A negative result must be combined with clinical observations, patient history, and epidemiological information. Fact Sheet for Patients:   StrictlyIdeas.no Fact Sheet for Healthcare Providers: BankingDealers.co.za This test is not yet approved or cleared  by the [place] FDA and has been authorized for  detection and/or diagnosis of SARS-CoV-2 by FDA under an Emergency Use Authorization (EUA).  This EUA will remain in effect (meaning this test can be used) for the duration of the COVID-19 declaration under Section 564(b)(1) of the Act, 21 U.S.C. section 360bbb-3(b)(1), unless the authorization is terminated or revoked sooner. Performed at [facility], [place] [address], [place], [place] [address]   Culture, blood (Routine X 2) w Reflex to ID Panel     Status: Abnormal   Collection Time: 06/11/19  8:20 PM   Specimen: BLOOD  Result Value Ref Range Status   Specimen Description BLOOD BLOOD LEFT  FOREARM  Final   Special Requests   Final    BOTTLES DRAWN AEROBIC AND ANAEROBIC Blood Culture results may not be optimal due to an inadequate volume of blood received in culture bottles   Culture  Setup Time (A)  Final    GRAM VARIABLE ROD ANAEROBIC BOTTLE ONLY CRITICAL RESULT CALLED TO, READ BACK BY AND VERIFIED WITH: PHARMD J FRENS AT 0815 06/16/19 BY L BENFIELD    Culture (A)  Final    BACTEROIDES SPECIES BETA LACTAMASE NEGATIVE Performed at [facility], [place] [address], [place], [place] [address]    Report Status 06/20/2019 FINAL  Final  Blood Culture ID Panel (Reflexed)     Status: None   Collection Time: 06/11/19  8:20 PM  Result Value Ref Range Status   Enterococcus species NOT DETECTED NOT DETECTED Final   Listeria monocytogenes NOT DETECTED NOT DETECTED Final   Staphylococcus species NOT DETECTED NOT DETECTED Final   Staphylococcus aureus (BCID) NOT DETECTED NOT DETECTED Final   Streptococcus species NOT DETECTED NOT DETECTED Final   Streptococcus agalactiae NOT DETECTED NOT DETECTED Final   Streptococcus pneumoniae NOT DETECTED NOT DETECTED Final   Streptococcus pyogenes NOT DETECTED NOT DETECTED Final   Acinetobacter baumannii NOT DETECTED NOT DETECTED Final   Enterobacteriaceae species NOT DETECTED NOT DETECTED Final   Enterobacter cloacae complex NOT DETECTED NOT DETECTED Final   Escherichia coli NOT DETECTED NOT DETECTED Final   Klebsiella oxytoca NOT DETECTED NOT DETECTED Final   Klebsiella pneumoniae NOT DETECTED NOT DETECTED Final   Proteus species NOT DETECTED NOT DETECTED Final   Serratia marcescens NOT DETECTED NOT DETECTED Final   Haemophilus influenzae NOT DETECTED NOT DETECTED Final   Neisseria meningitidis NOT DETECTED NOT DETECTED Final   Pseudomonas aeruginosa NOT DETECTED NOT DETECTED Final   Candida albicans NOT DETECTED NOT DETECTED Final   Candida glabrata NOT DETECTED NOT DETECTED Final   Candida krusei NOT DETECTED NOT DETECTED Final    Candida parapsilosis NOT DETECTED NOT DETECTED Final   Candida tropicalis NOT DETECTED NOT DETECTED Final    Comment: Performed at [facility], [address], [place], [place] [address]  Culture, blood (Routine X 2) w Reflex to ID Panel     Status: None   Collection Time: 06/11/19 10:00 PM   Specimen: BLOOD  Result Value Ref Range Status   Specimen Description BLOOD RIGHT HAND  Final   Special Requests   Final    BOTTLES DRAWN AEROBIC ONLY Blood Culture results may not be optimal due to an inadequate volume of blood received in culture bottles   Culture   Final    NO GROWTH 5 DAYS Performed at [facility], [place] [address], [place],  [address]    Report Status 06/16/2019 FINAL  Final  MRSA PCR Screening     Status: None  Collection Time: 06/15/19  9:26 AM   Specimen: Nasopharyngeal  Result Value Ref Range Status   MRSA by PCR NEGATIVE NEGATIVE Final    Comment:        The GeneXpert MRSA Assay (FDA approved for NASAL specimens only), is one component of a comprehensive MRSA colonization surveillance program. It is not intended to diagnose MRSA infection nor to guide or monitor treatment for MRSA infections. Performed at [facility], [place] [address], [place], [place] [address]       [facility] If 7PM-7AM, please contact night-coverage at [email or web address], Office  [phone]   06/21/2019, 2:26 PM  LOS: 10 days

## 2019-06-21 NOTE — Progress Notes (Signed)
Subjective/Chief Complaint: Pt doing well.  CT results reviewed   Objective: Vital signs in last 24 hours: Temp:  [98 F (36.7 C)-99 F (37.2 C)] 98.2 F (36.8 C) (06/12 0745) Pulse Rate:  [82-85] 83 (06/12 0745) Resp:  [13-18] 16 (06/12 0745) BP: (123-153)/(64-88) 153/73 (06/12 0745) SpO2:  [93 %-94 %] 93 % (06/12 0745) Last BM Date: 06/20/19  Intake/Output from previous day: 06/11 0701 - 06/12 0700 In: 100 [IV Piggyback:100] Out: 1690 [Urine:1500; Drains:190] Intake/Output this shift: No intake/output data recorded.  PE: Constitutional: No acute distress, conversant, appears states age. Eyes: Anicteric sclerae, moist conjunctiva, no lid lag Lungs: Clear to auscultation bilaterally, normal respiratory effort CV: regular rate and rhythm, no murmurs, no peripheral edema, pedal pulses 2+ GI: Soft, no masses or hepatosplenomegaly, non-tender to palpation, drain in place -feculent Skin: No rashes, palpation reveals normal turgor Psychiatric: appropriate judgment and insight, oriented to person, place, and time   Lab Results:  Recent Labs    06/19/19 0446 06/21/19 0406  WBC 14.3* 15.2*  HGB 9.7* 9.4*  HCT 31.8* 31.5*  PLT 306 271   BMET Recent Labs    06/20/19 1104 06/21/19 0406  NA 137 139  K 3.3* 3.2*  CL 101 100  CO2 23 25  GLUCOSE 138* 103*  BUN 25* 27*  CREATININE 4.48* 4.59*  CALCIUM 8.2* 8.0*   PT/INR No results for input(s): LABPROT, INR in the last 72 hours. ABG No results for input(s): PHART, HCO3 in the last 72 hours.  Invalid input(s): PCO2, PO2  Studies/Results: CT ABDOMEN PELVIS WO CONTRAST  Result Date: 06/20/2019 CLINICAL DATA:  Diverticular abscess follow-up. EXAM: CT ABDOMEN AND PELVIS WITHOUT CONTRAST TECHNIQUE: Multidetector CT imaging of the abdomen and pelvis was performed following the standard protocol without IV contrast. COMPARISON:  CT abdomen pelvis dated June 12, 2019. FINDINGS: Lower chest: No acute abnormality.  Hepatobiliary: No focal liver abnormality is seen. Unchanged hepatic steatosis. The gallbladder is decompressed. No biliary dilatation. Pancreas: Unremarkable. No pancreatic ductal dilatation or surrounding inflammatory changes. Spleen: Unchanged mild splenomegaly.  No focal abnormality. Adrenals/Urinary Tract: Adrenal glands are unremarkable. Kidneys are normal, without renal calculi, focal lesion, or hydronephrosis. Bladder is decompressed by a Foley catheter. Stomach/Bowel: Wall thickening of and inflammatory stranding surrounding the mid sigmoid colon have improved. New right lower quadrant pigtail drainage catheter within a now more organized gas and fluid collection in the right lower quadrant measuring 12.0 x 8.1 x 11.9 cm. This is increased in size when compared to the prior study. There is a new second smaller gas and fluid collection just superior to and separate from the bladder, measuring 5.0 x 3.8 cm (series 3, image 83). The stomach and small bowel are unremarkable. Vascular/Lymphatic: No significant vascular findings are present. No enlarged abdominal or pelvic lymph nodes. Reproductive: Uterus and bilateral adnexa are unremarkable. Other: Trace free fluid in the pelvis. Small amount of subcutaneous emphysema in the right lower abdominal wall around the pigtail drainage catheter. Musculoskeletal: No acute or significant osseous findings. IMPRESSION: 1. New right lower quadrant pigtail drainage catheter within a now more organized gas and fluid collection in the right lower quadrant measuring 12.0 x 8.1 x 11.9 cm, larger in size when compared to the prior study. 2. New second smaller gas and fluid collection just superior to and separate from the bladder, measuring 5.0 x 3.8 cm. 3. Decreasing sigmoid inflammatory changes. 4. Unchanged hepatic steatosis. Electronically Signed   By: Titus Dubin M.D.   On:  06/20/2019 15:07    Anti-infectives: Anti-infectives (From admission, onward)   Start      Dose/Rate Route Frequency Ordered Stop   06/20/19 1400  metroNIDAZOLE (FLAGYL) tablet 500 mg     Discontinue     500 mg Oral Every 8 hours 06/20/19 0928     06/14/19 1330  cefTRIAXone (ROCEPHIN) 2 g in sodium chloride 0.9 % 100 mL IVPB     Discontinue     2 g 200 mL/hr over 30 Minutes Intravenous Every 24 hours 06/14/19 1316     06/14/19 1330  metroNIDAZOLE (FLAGYL) IVPB 500 mg  Status:  Discontinued        500 mg 100 mL/hr over 60 Minutes Intravenous Every 8 hours 06/14/19 1316 06/20/19 0928   06/13/19 2200  piperacillin-tazobactam (ZOSYN) IVPB 3.375 g  Status:  Discontinued        3.375 g 12.5 mL/hr over 240 Minutes Intravenous Every 12 hours 06/13/19 1111 06/14/19 1316   06/11/19 2330  piperacillin-tazobactam (ZOSYN) IVPB 3.375 g  Status:  Discontinued        3.375 g 12.5 mL/hr over 240 Minutes Intravenous Every 8 hours 06/11/19 1852 06/13/19 1111   06/11/19 1715  piperacillin-tazobactam (ZOSYN) IVPB 3.375 g        3.375 g 100 mL/hr over 30 Minutes Intravenous  Once 06/11/19 1714 06/11/19 1942      Assessment/Plan: 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 Currently undergoing HD.Nephrology following Urinary Retention - Foley placed 6/4 Malnutrition - prealbumin 6.7  Acute sigmoid diverticulitis with perforationand abscess - 2nd bout,history of diverticulitis in 2014 - last colonoscopy >10 years ago - CT scan6/3showedprogression of acute sigmoid diverticulitis now with evidence of perforation, increasing fluid in the right lower quadrant with gas fluid level, no rim enhancement to suggest organized fluid collection or abscess at this time - S/p IR drain on 6/4, cx pending?  - Continue abx. Per ID (switched to rocephin/flagyl due to worsening renal failure) - Continue PT,  mobilize.  ID- zosyn 6/2-6/5, rocephin/flagyl 6/5>> FEN -IVF,soft VTE -SCDs, lovenox Foley -in place Follow up -TBD  Plan:Cont' abx.  Will d/w IR to see if drain cn be upsized as intraabd fluid collection is large and pts WBC is increasing  LOS: 10 days    Ralene Ok 06/21/2019

## 2019-06-21 NOTE — Progress Notes (Signed)
[facility] NEPHROLOGY PROGRESS NOTE  Assessment/ Plan: Pt is a 59 y.o. 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  #Acute kidney injury likely ischemic ATN due to hypotension, entresto, infection and contrast nephropathy.The urinalysis and serum creatinine level was normal on admission therefore no GN (h/o ANCA vasculitis).  CT scan ruled out obstruction. Received dialysis treatment for the management of severe azotemia and excess volume.  The last dialysis was on 6/11 with 2.4 L of ultrafiltration.  Urine output increased to 1.5 L and creatinine level plateau.  No plan for dialysis over the weekend and watch for renal recovery.  #Metabolic acidosis due to AKI: Improved.    #Sepsis due to perforated diverticulitis/intra-abdominal infection: Seen by general surgery thought to be very high risk for surgical intervention.  Status post right lower quadrant drain placed by IR on 6/4.    Antibiotics per primary team.    #History of ANCA vasculitis/microscopic polyangiitis: Reportedly she was treated with steroids, plasmapheresis, rituximab in the past.  She is currently on CellCept and methotrexate which is on hold because of sepsis and AKI.  She follows with rheumatology.  #Hypotension:  Entresto on hold. Monitor blood pressure.  Blood pressure acceptable.  #Hypokalemia: Replete potassium chloride.  Daily lab monitor.  Subjective: Seen and examined at bedside.  No new event.  Denies nausea vomiting chest pain shortness of breath.  Urine output increasing to 1.5 L in 24-hour.  Currently on soft diet.  Objective Vital signs in last 24 hours: Vitals:    06/20/19 1935 06/21/19 0000 06/21/19 0401 06/21/19 0745  BP: (!) 147/74 (!) 143/67 (!) 147/64 (!) 153/73  Pulse: 85 82 83 83  Resp: 16 14 18 16   Temp: 99 F (37.2 C) 98.9 F (37.2 C) 98.3 F (36.8 C) 98.2 F (36.8 C)  TempSrc: Oral Oral Oral Oral  SpO2: 94% 93% 94% 93%  Weight:      Height:       Weight change:   Intake/Output Summary (Last 24 hours) at 06/21/2019 1058 Last data filed at 06/21/2019 1008 Gross per 24 hour  Intake 100 ml  Output 1900 ml  Net -1800 ml       Labs: Basic Metabolic Panel: Recent Labs  Lab 06/16/19 0419 06/17/19 0221 06/19/19 0446 06/20/19 1104 06/21/19 0406  NA 136   < > 135 137 139  K 4.4   < > 3.3* 3.3* 3.2*  CL 104   < > 101 101 100  CO2 15*   < > 20* 23 25  GLUCOSE 102*   < > 111* 138* 103*  BUN 82*   < > 40* 25* 27*  CREATININE 8.01*   < > 5.70* 4.48* 4.59*  CALCIUM 7.4*   < > 8.0* 8.2* 8.0*  PHOS 7.5*  --   --  4.9* 5.9*   < > = values in this interval not displayed.   Liver Function Tests: Recent Labs  Lab 06/17/19 0221 06/17/19 0221 06/18/19 0446 06/20/19 1104 06/21/19 0406  AST 29  --  38  --   --   ALT 22  --  22  --   --   ALKPHOS 100  --  102  --   --  BILITOT 0.3  --  0.5  --   --   PROT 5.8*  --  5.7*  --   --   ALBUMIN 2.4*   < > 2.2* 2.4* 2.2*   < > = values in this interval not displayed.   No results for input(s): LIPASE, AMYLASE in the last 168 hours. No results for input(s): AMMONIA in the last 168 hours. CBC: Recent Labs  Lab 06/15/19 0710 06/15/19 0710 06/16/19 0419 06/16/19 0419 06/17/19 0221 06/17/19 0221 06/18/19 0446 06/19/19 0446 06/21/19 0406  WBC 14.0*   < > 12.2*   < > 13.0*   < > 13.2* 14.3* 15.2*  NEUTROABS 11.6*  --  9.4*  --   --   --   --   --   --   HGB 8.8*   < > 8.9*   < > 9.3*   < > 9.5* 9.7* 9.4*  HCT 29.4*   < > 30.4*   < > 29.7*   < > 30.5* 31.8* 31.5*  MCV 87.8   < > 89.4  --  85.3  --  84.7 84.8 86.8  PLT 272   < > 256   < > 275   < > 280 306 271   < > = values in  this interval not displayed.   Cardiac Enzymes: No results for input(s): CKTOTAL, CKMB, CKMBINDEX, TROPONINI in the last 168 hours. CBG: Recent Labs  Lab 06/20/19 0425 06/20/19 1140 06/20/19 1618 06/21/19 0046 06/21/19 0741  GLUCAP 115* 118* 113* 121* 102*    Iron Studies: No results for input(s): IRON, TIBC, TRANSFERRIN, FERRITIN in the last 72 hours. Studies/Results: CT ABDOMEN PELVIS WO CONTRAST  Result Date: 06/20/2019 CLINICAL DATA:  Diverticular abscess follow-up. EXAM: CT ABDOMEN AND PELVIS WITHOUT CONTRAST TECHNIQUE: Multidetector CT imaging of the abdomen and pelvis was performed following the standard protocol without IV contrast. COMPARISON:  CT abdomen pelvis dated June 12, 2019. FINDINGS: Lower chest: No acute abnormality. Hepatobiliary: No focal liver abnormality is seen. Unchanged hepatic steatosis. The gallbladder is decompressed. No biliary dilatation. Pancreas: Unremarkable. No pancreatic ductal dilatation or surrounding inflammatory changes. Spleen: Unchanged mild splenomegaly.  No focal abnormality. Adrenals/Urinary Tract: Adrenal glands are unremarkable. Kidneys are normal, without renal calculi, focal lesion, or hydronephrosis. Bladder is decompressed by a Foley catheter. Stomach/Bowel: Wall thickening of and inflammatory stranding surrounding the mid sigmoid colon have improved. New right lower quadrant pigtail drainage catheter within a now more organized gas and fluid collection in the right lower quadrant measuring 12.0 x 8.1 x 11.9 cm. This is increased in size when compared to the prior study. There is a new second smaller gas and fluid collection just superior to and separate from the bladder, measuring 5.0 x 3.8 cm (series 3, image 83). The stomach and small bowel are unremarkable. Vascular/Lymphatic: No significant vascular findings are present. No enlarged abdominal or pelvic lymph nodes. Reproductive: Uterus and bilateral adnexa are unremarkable. Other: Trace free  fluid in the pelvis. Small amount of subcutaneous emphysema in the right lower abdominal wall around the pigtail drainage catheter. Musculoskeletal: No acute or significant osseous findings. IMPRESSION: 1. New right lower quadrant pigtail drainage catheter within a now more organized gas and fluid collection in the right lower quadrant measuring 12.0 x 8.1 x 11.9 cm, larger in size when compared to the prior study. 2. New second smaller gas and fluid collection just superior to and separate from the bladder, measuring 5.0 x 3.8 cm. 3. Decreasing sigmoid  inflammatory changes. 4. Unchanged hepatic steatosis. Electronically Signed   By: Titus Dubin M.D.   On: 06/20/2019 15:07    Medications: Infusions: . cefTRIAXone (ROCEPHIN)  IV 2 g (06/20/19 1337)  . sodium chloride Stopped (06/12/19 0949)    Scheduled Medications: . buPROPion  450 mg Oral Daily  . Chlorhexidine Gluconate Cloth  6 each Topical Daily  . citalopram  40 mg Oral Daily  . enoxaparin (LOVENOX) injection  30 mg Subcutaneous Q24H  . feeding supplement (NEPRO CARB STEADY)  237 mL Oral BID BM  . insulin aspart  0-9 Units Subcutaneous Q4H  . metroNIDAZOLE  500 mg Oral Q8H  . pantoprazole  40 mg Oral Daily  . saccharomyces boulardii  250 mg Oral BID  . sodium chloride flush  10-40 mL Intracatheter Q12H  . sodium chloride flush  5 mL Intracatheter Q8H    have reviewed scheduled and prn medications.  Physical Exam: General:NAD, able to lie comfortable. Heart:RRR, s1s2 nl, no rubs Lungs: Basal decreased breath sound Abdomen:soft, non-distended, right lower quadrant drain Extremities: No LE edema Neurology: Alert awake and following commands, no asterixis  [name] 06/21/2019,10:58 AM  LOS: 10 days  Pager: [phone]

## 2019-06-22 LAB — RENAL FUNCTION PANEL
Albumin: 2.2 g/dL — ABNORMAL LOW (ref 3.5–5.0)
Anion gap: 12 (ref 5–15)
BUN: 29 mg/dL — ABNORMAL HIGH (ref 6–20)
CO2: 26 mmol/L (ref 22–32)
Calcium: 8.2 mg/dL — ABNORMAL LOW (ref 8.9–10.3)
Chloride: 101 mmol/L (ref 98–111)
Creatinine, Ser: 4.43 mg/dL — ABNORMAL HIGH (ref 0.44–1.00)
GFR calc Af Amer: 12 mL/min — ABNORMAL LOW (ref >60)
GFR calc non Af Amer: 10 mL/min — ABNORMAL LOW (ref >60)
Glucose, Bld: 113 mg/dL — ABNORMAL HIGH (ref 70–99)
Phosphorus: 6 mg/dL — ABNORMAL HIGH (ref 2.5–4.6)
Potassium: 3.4 mmol/L — ABNORMAL LOW (ref 3.5–5.1)
Sodium: 139 mmol/L (ref 135–145)

## 2019-06-22 LAB — GLUCOSE, CAPILLARY
Glucose-Capillary: 103 mg/dL — ABNORMAL HIGH (ref 70–99)
Glucose-Capillary: 111 mg/dL — ABNORMAL HIGH (ref 70–99)
Glucose-Capillary: 128 mg/dL — ABNORMAL HIGH (ref 70–99)
Glucose-Capillary: 129 mg/dL — ABNORMAL HIGH (ref 70–99)
Glucose-Capillary: 98 mg/dL (ref 70–99)

## 2019-06-22 MED ORDER — POTASSIUM CHLORIDE CRYS ER 20 MEQ PO TBCR
20.0000 meq | EXTENDED_RELEASE_TABLET | Freq: Once | ORAL | Status: AC
  Administered 2019-06-22: 20 meq via ORAL
  Filled 2019-06-22: qty 1

## 2019-06-22 NOTE — Progress Notes (Signed)
Subjective/Chief Complaint: Pt doing well  No acute changes   Objective: Vital signs in last 24 hours: Temp:  [97.9 F (36.6 C)-98.6 F (37 C)] 98.6 F (37 C) (06/13 0400) Pulse Rate:  [78-84] 81 (06/13 0400) Resp:  [15-17] 15 (06/13 0400) BP: (132-150)/(66-73) 148/73 (06/13 0400) SpO2:  [94 %-95 %] 94 % (06/13 0400) Last BM Date: 06/21/19  Intake/Output from previous day: 06/12 0701 - 06/13 0700 In: 1024 [P.O.:1024] Out: 2165 [Urine:2025; Drains:140] Intake/Output this shift: No intake/output data recorded.   PE:  Constitutional: No acute distress, conversant, appears states age. Eyes: Anicteric sclerae, moist conjunctiva, no lid lag Lungs: Clear to auscultation bilaterally, normal respiratory effort CV: regular rate and rhythm, no murmurs, no peripheral edema, pedal pulses 2+ GI: Soft, no masses or hepatosplenomegaly, non-tender to palpation, Drain in place and feculent Skin: No rashes, palpation reveals normal turgor Psychiatric: appropriate judgment and insight, oriented to person, place, and time   Lab Results:  Recent Labs    06/21/19 0406  WBC 15.2*  HGB 9.4*  HCT 31.5*  PLT 271   BMET Recent Labs    06/21/19 0406 06/22/19 0410  NA 139 139  K 3.2* 3.4*  CL 100 101  CO2 25 26  GLUCOSE 103* 113*  BUN 27* 29*  CREATININE 4.59* 4.43*  CALCIUM 8.0* 8.2*  Studies/Results: CT ABDOMEN PELVIS WO CONTRAST  Result Date: 06/20/2019 CLINICAL DATA:  Diverticular abscess follow-up. EXAM: CT ABDOMEN AND PELVIS WITHOUT CONTRAST TECHNIQUE: Multidetector CT imaging of the abdomen and pelvis was performed following the standard protocol without IV contrast. COMPARISON:  CT abdomen pelvis dated June 12, 2019. FINDINGS: Lower chest: No acute abnormality. Hepatobiliary: No focal liver abnormality is seen. Unchanged hepatic steatosis. The gallbladder is decompressed. No biliary dilatation. Pancreas: Unremarkable. No pancreatic ductal dilatation or surrounding  inflammatory changes. Spleen: Unchanged mild splenomegaly.  No focal abnormality. Adrenals/Urinary Tract: Adrenal glands are unremarkable. Kidneys are normal, without renal calculi, focal lesion, or hydronephrosis. Bladder is decompressed by a Foley catheter. Stomach/Bowel: Wall thickening of and inflammatory stranding surrounding the mid sigmoid colon have improved. New right lower quadrant pigtail drainage catheter within a now more organized gas and fluid collection in the right lower quadrant measuring 12.0 x 8.1 x 11.9 cm. This is increased in size when compared to the prior study. There is a new second smaller gas and fluid collection just superior to and separate from the bladder, measuring 5.0 x 3.8 cm (series 3, image 83). The stomach and small bowel are unremarkable. Vascular/Lymphatic: No significant vascular findings are present. No enlarged abdominal or pelvic lymph nodes. Reproductive: Uterus and bilateral adnexa are unremarkable. Other: Trace free fluid in the pelvis. Small amount of subcutaneous emphysema in the right lower abdominal wall around the pigtail drainage catheter. Musculoskeletal: No acute or significant osseous findings. IMPRESSION: 1. New right lower quadrant pigtail drainage catheter within a now more organized gas and fluid collection in the right lower quadrant measuring 12.0 x 8.1 x 11.9 cm, larger in size when compared to the prior study. 2. New second smaller gas and fluid collection just superior to and separate from the bladder, measuring 5.0 x 3.8 cm. 3. Decreasing sigmoid inflammatory changes. 4. Unchanged hepatic steatosis. Electronically Signed   By: Titus Dubin M.D.   On: 06/20/2019 15:07    Anti-infectives: Anti-infectives (From admission, onward)   Start     Dose/Rate Route Frequency Ordered Stop   06/20/19 1400  metroNIDAZOLE (FLAGYL) tablet 500 mg  Discontinue     500 mg Oral Every 8 hours 06/20/19 0928     06/14/19 1330  cefTRIAXone (ROCEPHIN) 2 g in  sodium chloride 0.9 % 100 mL IVPB     Discontinue     2 g 200 mL/hr over 30 Minutes Intravenous Every 24 hours 06/14/19 1316     06/14/19 1330  metroNIDAZOLE (FLAGYL) IVPB 500 mg  Status:  Discontinued        500 mg 100 mL/hr over 60 Minutes Intravenous Every 8 hours 06/14/19 1316 06/20/19 0928   06/13/19 2200  piperacillin-tazobactam (ZOSYN) IVPB 3.375 g  Status:  Discontinued        3.375 g 12.5 mL/hr over 240 Minutes Intravenous Every 12 hours 06/13/19 1111 06/14/19 1316   06/11/19 2330  piperacillin-tazobactam (ZOSYN) IVPB 3.375 g  Status:  Discontinued        3.375 g 12.5 mL/hr over 240 Minutes Intravenous Every 8 hours 06/11/19 1852 06/13/19 1111   06/11/19 1715  piperacillin-tazobactam (ZOSYN) IVPB 3.375 g        3.375 g 100 mL/hr over 30 Minutes Intravenous  Once 06/11/19 1714 06/11/19 1942      Assessment/Plan: 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 Currently undergoing HD.Nephrology following Urinary Retention - Foley placed 6/4 Malnutrition - prealbumin 6.7  Acute sigmoid diverticulitis with perforationand abscess - 2nd bout,history of diverticulitis in 2014 - last colonoscopy >10 years ago - CT scan6/3showedprogression of acute sigmoid diverticulitis now with evidence of perforation, increasing fluid in the right lower quadrant with gas fluid level, no rim enhancement to suggest organized fluid collection or abscess at this time - S/p IR drain on 6/4, cx pending?  - Continue abx. Per ID (switched to rocephin/flagyl due to worsening renal failure) - Continue PT, mobilize.  ID- zosyn 6/2-6/5, rocephin/flagyl 6/5>> FEN -IVF,soft VTE -SCDs, lovenox Foley -in place Follow up -TBD  Plan:Cont' abx.  IR to upsize drain on Monday    LOS: 11 days     Ralene Ok 06/22/2019

## 2019-06-22 NOTE — Progress Notes (Signed)
Patient refused dressing change at this time.  Patient stated she wanted to wait until later in the day.  Will report to the day shift nurse and continue to monitor the patient

## 2019-06-22 NOTE — Progress Notes (Signed)
[facility]  PROGRESS NOTE  [name]:[id number] DOB: June 09, 1960 DOA: 06/11/2019 PCP: Christain Sacramento, MD   Brief HPI:   59 year old female with history of prior sigmoid diverticulitis with perforation in 2014, history of ILD on chronic suppression, diabetes mellitus type 2 on Metformin, obesity, hypertension came to ED with complaints of abdominal pain.  In the ED CT abdomen pelvis showed sigmoid diverticulitis.  Patient was started on IV Zosyn.  Hospitalist was consulted for admission. Repeat CT abdomen pelvis shows new right lower quadrant pigtail drainage catheter within a new more organized gas and fluid collection in the right lower quadrant measuring 12 x 8.1 x 11.9 cm.  Large in size as compared to previous study.  IR has been reconsulted by general surgery for replacing the drain.    Subjective   Patient seen and examined, complains of right lower quadrant pain.   Assessment/Plan:     1. Acute sigmoid diverticulitis-patient initially presented with sigmoid vasculitis seen on CT.  As patient had worsening symptoms repeat CT was done which showed possibility of perforation without abscess collection.  General surgery was recommended who recommended IR for drain placement.  Patient had drain placement on 06/13/2019 with purulent material expressed.  WBC is 15,000.  She was initially on IV Zosyn however was changed to Rocephin and Flagyl.  ID and general surgery following.  CT abdomen pelvis ordered per general surgery.  Shows large collection which is larger than previous CT scan.  IR reconsulted per general surgery.  Plan for replacing the drain with JP drain  in a.m.  Blood culture growing gram-variable rods.  Final result is pending 2. Acute kidney injury-suspected ATN, urine presentation showed no blood or protein.  Likely induced by hypotension, contrast-induced nephropathy and Entresto.  Nephrology following.  Patient was started on hemodialysis for uremic symptoms.   Creatinine slowly improving, today creatinine 4.43.  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  She is on maintenance with CellCept and methotrexate which are currently on hold secondary to sepsis.  Previously treated with corticosteroids/Plex and rituximab. 3. Diabetes mellitus type 2-hemoglobin A1c 7.0.  Oral hypo-glycemic agents on hold.  Continue sliding scale insulin with NovoLog. 4. Hypokalemia-potassium is 3.4.  Follow BMP in a.m. 5. Hypertension-Entresto has been stopped.  Patient was taking Coreg at home.  Currently she is on IV labetalol 20 mg IV every 2 hours as needed for SBP more than 160.  Blood pressure is stable. 6. Morbid obesity    SpO2: 95 % O2 Flow Rate (L/min): 2 L/min   COVID-19 Labs  No results for input(s): DDIMER, FERRITIN, LDH, CRP in the last 72 hours.  Lab Results  Component Value Date   SARSCOV2NAA NEGATIVE 06/11/2019   [place] NEGATIVE 05/30/2019     CBG: Recent Labs  Lab 06/21/19 1621 06/21/19 2010 06/22/19 0014 06/22/19 0357 06/22/19 0800  GLUCAP 118* 177* 129* 111* 98    CBC: Recent Labs  Lab 06/16/19 0419 06/17/19 0221 06/18/19 0446 06/19/19 0446 06/21/19 0406  WBC 12.2* 13.0* 13.2* 14.3* 15.2*  NEUTROABS 9.4*  --   --   --   --   HGB 8.9* 9.3* 9.5* 9.7* 9.4*  HCT 30.4* 29.7* 30.5* 31.8* 31.5*  MCV 89.4 85.3 84.7 84.8 86.8  PLT 256 275 280 306 811    Basic Metabolic Panel: Recent Labs  Lab 06/16/19 0419 06/17/19 0221 06/18/19 0446 06/19/19 0446 06/20/19 1104 06/21/19 0406 06/22/19 0410  NA 136   < >  134* 135 137 139 139  K 4.4   < > 3.6 3.3* 3.3* 3.2* 3.4*  CL 104   < > 101 101 101 100 101  CO2 15*   < > 19* 20* 23 25 26   GLUCOSE 102*   < > 109* 111* 138* 103* 113*  BUN 82*   < > 62* 40* 25* 27* 29*  CREATININE 8.01*   < > 7.63* 5.70* 4.48* 4.59* 4.43*  CALCIUM 7.4*   < > 7.8* 8.0* 8.2* 8.0* 8.2*  MG  --   --  2.1  --   --   --   --   PHOS  7.5*  --   --   --  4.9* 5.9* 6.0*   < > = values in this interval not displayed.     Liver Function Tests: Recent Labs  Lab 06/17/19 0221 06/18/19 0446 06/20/19 1104 06/21/19 0406 06/22/19 0410  AST 29 38  --   --   --   ALT 22 22  --   --   --   ALKPHOS 100 102  --   --   --   BILITOT 0.3 0.5  --   --   --   PROT 5.8* 5.7*  --   --   --   ALBUMIN 2.4* 2.2* 2.4* 2.2* 2.2*        DVT prophylaxis: Lovenox  Code Status: Full code  Family Communication: No family at bedside    Status is: Inpatient  Dispo: The patient is from: Home              Anticipated d/c is to: Home              Anticipated d/c date is: 06/25/2019              Patient currently not medically stable for discharge.  Barrier to discharge-acute kidney injury, sigmoid diverticulitis, patient on dialysis        Scheduled medications:  . buPROPion  450 mg Oral Daily  . Chlorhexidine Gluconate Cloth  6 each Topical Daily  . citalopram  40 mg Oral Daily  . enoxaparin (LOVENOX) injection  30 mg Subcutaneous Q24H  . feeding supplement (NEPRO CARB STEADY)  237 mL Oral BID BM  . insulin aspart  0-9 Units Subcutaneous Q4H  . metroNIDAZOLE  500 mg Oral Q8H  . pantoprazole  40 mg Oral Daily  . saccharomyces boulardii  250 mg Oral BID  . sodium chloride flush  10-40 mL Intracatheter Q12H  . sodium chloride flush  5 mL Intracatheter Q8H    Consultants:  Nephrology  ID  IR  General surgery  Procedures:    Antibiotics:   Anti-infectives (From admission, onward)   Start     Dose/Rate Route Frequency Ordered Stop   06/20/19 1400  metroNIDAZOLE (FLAGYL) tablet 500 mg     Discontinue     500 mg Oral Every 8 hours 06/20/19 0928     06/14/19 1330  cefTRIAXone (ROCEPHIN) 2 g in sodium chloride 0.9 % 100 mL IVPB     Discontinue     2 g 200 mL/hr over 30 Minutes Intravenous Every 24 hours 06/14/19 1316     06/14/19 1330  metroNIDAZOLE (FLAGYL) IVPB 500 mg  Status:  Discontinued        500  mg 100 mL/hr over 60 Minutes Intravenous Every 8 hours 06/14/19 1316 06/20/19 0928   06/13/19 2200  piperacillin-tazobactam (ZOSYN) IVPB 3.375 g  Status:  Discontinued        3.375 g 12.5 mL/hr over 240 Minutes Intravenous Every 12 hours 06/13/19 1111 06/14/19 1316   06/11/19 2330  piperacillin-tazobactam (ZOSYN) IVPB 3.375 g  Status:  Discontinued        3.375 g 12.5 mL/hr over 240 Minutes Intravenous Every 8 hours 06/11/19 1852 06/13/19 1111   06/11/19 1715  piperacillin-tazobactam (ZOSYN) IVPB 3.375 g        3.375 g 100 mL/hr over 30 Minutes Intravenous  Once 06/11/19 1714 06/11/19 1942       Objective   Vitals:   06/22/19 0400 06/22/19 0800 06/22/19 1230 06/22/19 1231  BP: (!) 148/73 133/69 (!) 152/72   Pulse: 81 82 81   Resp: 15 13 16    Temp: 98.6 F (37 C) 98.8 F (37.1 C) 98.2 F (36.8 C) 98.2 F (36.8 C)  TempSrc: Oral Oral Oral Oral  SpO2: 94% 94% 95%   Weight:      Height:        Intake/Output Summary (Last 24 hours) at 06/22/2019 1351 Last data filed at 06/22/2019 1304 Gross per 24 hour  Intake 1133 ml  Output 2065 ml  Net -932 ml    06/11 1901 - 06/13 0700 In: 1024 [P.O.:1024] Out: 8413 [Urine:3175; Drains:140]  Filed Weights   06/19/19 0422 06/19/19 0720 06/19/19 1059  Weight: (!) 180.5 kg (!) 180.1 kg (!) 176.6 kg    Physical Examination:  General-appears in no acute distress Heart-S1-S2, regular, no murmur auscultated Lungs-clear to auscultation bilaterally, no wheezing or crackles auscultated Abdomen-soft, tenderness in right lower quadrant, no organomegaly Extremities-no edema in the lower extremities Neuro-alert, oriented x3, no focal deficit noted   Data Reviewed:   Recent Results (from the past 240 hour(s))  MRSA PCR Screening     Status: None   Collection Time: 06/15/19  9:26 AM   Specimen: Nasopharyngeal  Result Value Ref Range Status   MRSA by PCR NEGATIVE NEGATIVE Final    Comment:        The GeneXpert MRSA Assay  (FDA approved for NASAL specimens only), is one component of a comprehensive MRSA colonization surveillance program. It is not intended to diagnose MRSA infection nor to guide or monitor treatment for MRSA infections. Performed at [facility], [place] [address], [place], [place] [address]       [facility] If 7PM-7AM, please contact night-coverage at [email or web address], Office  [phone]   06/22/2019, 1:51 PM  LOS: 11 days

## 2019-06-22 NOTE — Progress Notes (Signed)
[facility] NEPHROLOGY PROGRESS NOTE  Assessment/ Plan: Pt is a 59 y.o. 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  #Acute kidney injury likely ischemic ATN due to hypotension, entresto, infection and contrast nephropathy.The urinalysis and serum creatinine level was normal on admission therefore no GN (h/o ANCA vasculitis).  CT scan ruled out obstruction. Received dialysis treatment for the management of severe azotemia and excess volume.  The last dialysis was on 6/11 with 2.4 L of ultrafiltration.  The urine output has increased to 2 L and creatinine level plateau.  We will check labs daily and monitor urine output.  Hopefully she will continue to have renal recovery to avoid further dialysis.  I will keep the dialysis catheter for next few days  #Metabolic acidosis due to AKI: Improved.    #Sepsis due to perforated diverticulitis/intra-abdominal infection: Seen by general surgery thought to be very high risk for surgical intervention.  Status post right lower quadrant drain placed by IR on 6/4.  Antibiotics per primary team.  Plan to upsize the drain by IR tomorrow.  #History of ANCA vasculitis/microscopic polyangiitis: Reportedly she was treated with steroids, plasmapheresis, rituximab in the past.  She is currently on CellCept and methotrexate which is on hold because of sepsis and AKI.  She follows with rheumatology.  #Hypotension:  Entresto on hold. Monitor blood pressure.  Blood pressure acceptable.  #Hypokalemia: Replete potassium chloride.  Daily lab monitor.  Subjective: Seen and examined at bedside. Urine output 2000 cc.  Denies nausea  vomiting chest pain shortness of breath.  No new event.  Objective Vital signs in last 24 hours: Vitals:   06/21/19 2000 06/22/19 0015 06/22/19 0400 06/22/19 0800  BP: (!) 150/71 (!) 147/70 (!) 148/73 133/69  Pulse: 82 84 81 82  Resp: 17 17 15 13   Temp: 98.6 F (37 C) 98.5 F (36.9 C) 98.6 F (37 C) 98.8 F (37.1 C)  TempSrc: Oral Oral Oral Oral  SpO2: 95% 95% 94% 94%  Weight:      Height:       Weight change:   Intake/Output Summary (Last 24 hours) at 06/22/2019 1112 Last data filed at 06/22/2019 0821 Gross per 24 hour  Intake 684 ml  Output 2065 ml  Net -1381 ml       Labs: Basic Metabolic Panel: Recent Labs  Lab 06/20/19 1104 06/21/19 0406 06/22/19 0410  NA 137 139 139  K 3.3* 3.2* 3.4*  CL 101 100 101  CO2 23 25 26   GLUCOSE 138* 103* 113*  BUN 25* 27* 29*  CREATININE 4.48* 4.59* 4.43*  CALCIUM 8.2* 8.0* 8.2*  PHOS 4.9* 5.9* 6.0*   Liver Function Tests: Recent Labs  Lab 06/17/19 0221 06/17/19 0221 06/18/19 0446 06/18/19 0446 06/20/19 1104 06/21/19 0406 06/22/19 0410  AST 29  --  38  --   --   --   --   ALT 22  --  22  --   --   --   --   ALKPHOS 100  --  102  --   --   --   --   BILITOT 0.3  --  0.5  --   --   --   --   PROT  5.8*  --  5.7*  --   --   --   --   ALBUMIN 2.4*   < > 2.2*   < > 2.4* 2.2* 2.2*   < > = values in this interval not displayed.   No results for input(s): LIPASE, AMYLASE in the last 168 hours. No results for input(s): AMMONIA in the last 168 hours. CBC: Recent Labs  Lab 06/16/19 0419 06/16/19 0419 06/17/19 0221 06/17/19 0221 06/18/19 0446 06/19/19 0446 06/21/19 0406  WBC 12.2*   < > 13.0*   < > 13.2* 14.3* 15.2*  NEUTROABS 9.4*  --   --   --   --   --   --   HGB 8.9*   < > 9.3*   < > 9.5* 9.7* 9.4*  HCT 30.4*   < > 29.7*   < > 30.5* 31.8* 31.5*  MCV 89.4  --  85.3  --  84.7 84.8 86.8  PLT 256   < > 275   < > 280 306 271   < > = values in this interval not displayed.   Cardiac Enzymes: No results for  input(s): CKTOTAL, CKMB, CKMBINDEX, TROPONINI in the last 168 hours. CBG: Recent Labs  Lab 06/21/19 1621 06/21/19 2010 06/22/19 0014 06/22/19 0357 06/22/19 0800  GLUCAP 118* 177* 129* 111* 98    Iron Studies: No results for input(s): IRON, TIBC, TRANSFERRIN, FERRITIN in the last 72 hours. Studies/Results: CT ABDOMEN PELVIS WO CONTRAST  Result Date: 06/20/2019 CLINICAL DATA:  Diverticular abscess follow-up. EXAM: CT ABDOMEN AND PELVIS WITHOUT CONTRAST TECHNIQUE: Multidetector CT imaging of the abdomen and pelvis was performed following the standard protocol without IV contrast. COMPARISON:  CT abdomen pelvis dated June 12, 2019. FINDINGS: Lower chest: No acute abnormality. Hepatobiliary: No focal liver abnormality is seen. Unchanged hepatic steatosis. The gallbladder is decompressed. No biliary dilatation. Pancreas: Unremarkable. No pancreatic ductal dilatation or surrounding inflammatory changes. Spleen: Unchanged mild splenomegaly.  No focal abnormality. Adrenals/Urinary Tract: Adrenal glands are unremarkable. Kidneys are normal, without renal calculi, focal lesion, or hydronephrosis. Bladder is decompressed by a Foley catheter. Stomach/Bowel: Wall thickening of and inflammatory stranding surrounding the mid sigmoid colon have improved. New right lower quadrant pigtail drainage catheter within a now more organized gas and fluid collection in the right lower quadrant measuring 12.0 x 8.1 x 11.9 cm. This is increased in size when compared to the prior study. There is a new second smaller gas and fluid collection just superior to and separate from the bladder, measuring 5.0 x 3.8 cm (series 3, image 83). The stomach and small bowel are unremarkable. Vascular/Lymphatic: No significant vascular findings are present. No enlarged abdominal or pelvic lymph nodes. Reproductive: Uterus and bilateral adnexa are unremarkable. Other: Trace free fluid in the pelvis. Small amount of subcutaneous emphysema in the  right lower abdominal wall around the pigtail drainage catheter. Musculoskeletal: No acute or significant osseous findings. IMPRESSION: 1. New right lower quadrant pigtail drainage catheter within a now more organized gas and fluid collection in the right lower quadrant measuring 12.0 x 8.1 x 11.9 cm, larger in size when compared to the prior study. 2. New second smaller gas and fluid collection just superior to and separate from the bladder, measuring 5.0 x 3.8 cm. 3. Decreasing sigmoid inflammatory changes. 4. Unchanged hepatic steatosis. Electronically Signed   By: Titus Dubin M.D.   On: 06/20/2019 15:07    Medications: Infusions: . cefTRIAXone (ROCEPHIN)  IV 2 g (06/21/19 1345)  .  sodium chloride Stopped (06/12/19 0949)    Scheduled Medications: . buPROPion  450 mg Oral Daily  . Chlorhexidine Gluconate Cloth  6 each Topical Daily  . citalopram  40 mg Oral Daily  . enoxaparin (LOVENOX) injection  30 mg Subcutaneous Q24H  . feeding supplement (NEPRO CARB STEADY)  237 mL Oral BID BM  . insulin aspart  0-9 Units Subcutaneous Q4H  . metroNIDAZOLE  500 mg Oral Q8H  . pantoprazole  40 mg Oral Daily  . saccharomyces boulardii  250 mg Oral BID  . sodium chloride flush  10-40 mL Intracatheter Q12H  . sodium chloride flush  5 mL Intracatheter Q8H    have reviewed scheduled and prn medications.  Physical Exam: General:NAD, comfortable Heart:RRR, s1s2 nl, no rubs Lungs: Distant breath sound, no wheezing Abdomen:soft, non-distended, right lower quadrant drain Extremities: No LE edema Neurology: Alert awake and following commands, no asterixis  Lynnita Somma Tanna Furry 06/22/2019,11:12 AM  LOS: 11 days  Pager: [phone]

## 2019-06-23 ENCOUNTER — Inpatient Hospital Stay (HOSPITAL_COMMUNITY): Payer: Commercial Managed Care - PPO

## 2019-06-23 DIAGNOSIS — L8990 Pressure ulcer of unspecified site, unspecified stage: Secondary | ICD-10-CM | POA: Insufficient documentation

## 2019-06-23 DIAGNOSIS — K5720 Diverticulitis of large intestine with perforation and abscess without bleeding: Secondary | ICD-10-CM | POA: Diagnosis present

## 2019-06-23 LAB — GLUCOSE, CAPILLARY
Glucose-Capillary: 113 mg/dL — ABNORMAL HIGH (ref 70–99)
Glucose-Capillary: 119 mg/dL — ABNORMAL HIGH (ref 70–99)
Glucose-Capillary: 98 mg/dL (ref 70–99)

## 2019-06-23 LAB — RENAL FUNCTION PANEL
Albumin: 2.4 g/dL — ABNORMAL LOW (ref 3.5–5.0)
Anion gap: 14 (ref 5–15)
BUN: 27 mg/dL — ABNORMAL HIGH (ref 6–20)
CO2: 23 mmol/L (ref 22–32)
Calcium: 8.1 mg/dL — ABNORMAL LOW (ref 8.9–10.3)
Chloride: 104 mmol/L (ref 98–111)
Creatinine, Ser: 3.81 mg/dL — ABNORMAL HIGH (ref 0.44–1.00)
GFR calc Af Amer: 14 mL/min — ABNORMAL LOW (ref >60)
GFR calc non Af Amer: 12 mL/min — ABNORMAL LOW (ref >60)
Glucose, Bld: 113 mg/dL — ABNORMAL HIGH (ref 70–99)
Phosphorus: 5.2 mg/dL — ABNORMAL HIGH (ref 2.5–4.6)
Potassium: 3.4 mmol/L — ABNORMAL LOW (ref 3.5–5.1)
Sodium: 141 mmol/L (ref 135–145)

## 2019-06-23 LAB — CBC
HCT: 31.9 % — ABNORMAL LOW (ref 36.0–46.0)
Hemoglobin: 9.8 g/dL — ABNORMAL LOW (ref 12.0–15.0)
MCH: 26.4 pg (ref 26.0–34.0)
MCHC: 30.7 g/dL (ref 30.0–36.0)
MCV: 86 fL (ref 80.0–100.0)
Platelets: 247 10*3/uL (ref 150–400)
RBC: 3.71 MIL/uL — ABNORMAL LOW (ref 3.87–5.11)
RDW: 14.7 % (ref 11.5–15.5)
WBC: 14.6 10*3/uL — ABNORMAL HIGH (ref 4.0–10.5)
nRBC: 0 % (ref 0.0–0.2)

## 2019-06-23 MED ORDER — POTASSIUM CHLORIDE CRYS ER 20 MEQ PO TBCR
40.0000 meq | EXTENDED_RELEASE_TABLET | Freq: Once | ORAL | Status: AC
  Administered 2019-06-23: 40 meq via ORAL
  Filled 2019-06-23: qty 2

## 2019-06-23 NOTE — Progress Notes (Signed)
Discussed patient with Dr. Webb/Nephrologist. Patient with renal recovery and catheter can be removed. Renal Navigator has sent secure Epic message to Primary/Dr. Darrick Meigs.   Alphonzo Cruise, [place] Renal Navigator [phone]

## 2019-06-23 NOTE — Progress Notes (Signed)
[facility]  PROGRESS NOTE  TENEISHA GIGNAC TMH:[id number] DOB: [birth year] DOA: 06/11/2019 PCP: Christain Sacramento, MD   Brief HPI:   59 year old female with history of prior sigmoid diverticulitis with perforation in 2014, history of ILD on chronic suppression, diabetes mellitus type 2 on Metformin, obesity, hypertension came to ED with complaints of abdominal pain.  In the ED CT abdomen pelvis showed sigmoid diverticulitis.  Patient was started on IV Zosyn.  Hospitalist was consulted for admission. Repeat CT abdomen pelvis shows new right lower quadrant pigtail drainage catheter within a new more organized gas and fluid collection in the right lower quadrant measuring 12 x 8.1 x 11.9 cm.  Large in size as compared to previous study.  IR has been reconsulted by general surgery for replacing the drain.    Subjective   Patient seen and examined, plan for placing the right lower quadrant drain per IR today.   Assessment/Plan:     1. Acute sigmoid diverticulitis-patient initially presented with sigmoid vasculitis seen on CT.  As patient had worsening symptoms repeat CT was done which showed possibility of perforation without abscess collection.  General surgery was recommended who recommended IR for drain placement.  Patient had drain placement on 06/13/2019 with purulent material expressed.  WBC is 15,000.  She was initially on IV Zosyn however was changed to Rocephin and Flagyl.  ID and general surgery following.  CT abdomen pelvis ordered per general surgery.  Shows large collection which is larger than previous CT scan.  IR reconsulted per general surgery.  Plan for replacing the drain with JP drain today.  Blood culture growing gram-variable rods.  Final result is pending 2. Orthostatic hypotension-noted per physical therapy, blood pressure dropped from 124/73-87/75 on standing.  Patient is not on antihypertensive medication at this time.  Will order TED hose bilaterally.  Also check  orthostatic vital signs every 12 hours. 3. Acute kidney injury-suspected ATN, urine presentation showed no blood or protein.  Likely induced by hypotension, contrast-induced nephropathy and Entresto.  Nephrology following.  Patient was started on hemodialysis for uremic symptoms.  Creatinine slowly improving, today creatinine 3.81.  Patient now making urine. 4. 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  She is on maintenance with CellCept and methotrexate which are currently on hold secondary to sepsis.  Previously treated with corticosteroids/Plex and rituximab. 5. Diabetes mellitus type 2-hemoglobin A1c 7.0.  Oral hypo-glycemic agents on hold.  Continue sliding scale insulin with NovoLog. 6. Hypokalemia-potassium is 3.4.  Replace potassium and follow BMP in a.m. 7. Hypertension-Entresto has been stopped.  Patient was taking Coreg at home.  Currently she is on IV labetalol 20 mg IV every 2 hours as needed for SBP more than 160.  Blood pressure is stable. 8. Morbid obesity    SpO2: 93 % O2 Flow Rate (L/min): 2 L/min   COVID-19 Labs  No results for input(s): DDIMER, FERRITIN, LDH, CRP in the last 72 hours.  Lab Results  Component Value Date   SARSCOV2NAA NEGATIVE 06/11/2019   [place] NEGATIVE 05/30/2019     CBG: Recent Labs  Lab 06/22/19 0800 06/22/19 1547 06/22/19 2329 06/23/19 0358 06/23/19 1558  GLUCAP 98 103* 128* 119* 98    CBC: Recent Labs  Lab 06/17/19 0221 06/18/19 0446 06/19/19 0446 06/21/19 0406 06/23/19 0255  WBC 13.0* 13.2* 14.3* 15.2* 14.6*  HGB 9.3* 9.5* 9.7* 9.4* 9.8*  HCT 29.7* 30.5* 31.8* 31.5* 31.9*  MCV 85.3 84.7 84.8 86.8 86.0  PLT 275 280  306 271 782    Basic Metabolic Panel: Recent Labs  Lab 06/18/19 0446 06/18/19 0446 06/19/19 0446 06/20/19 1104 06/21/19 0406 06/22/19 0410 06/23/19 0255  NA 134*   < > 135 137 139 139 141  K 3.6   < > 3.3* 3.3* 3.2* 3.4* 3.4*  CL 101   < > 101 101  100 101 104  CO2 19*   < > 20* 23 25 26 23   GLUCOSE 109*   < > 111* 138* 103* 113* 113*  BUN 62*   < > 40* 25* 27* 29* 27*  CREATININE 7.63*   < > 5.70* 4.48* 4.59* 4.43* 3.81*  CALCIUM 7.8*   < > 8.0* 8.2* 8.0* 8.2* 8.1*  MG 2.1  --   --   --   --   --   --   PHOS  --   --   --  4.9* 5.9* 6.0* 5.2*   < > = values in this interval not displayed.     Liver Function Tests: Recent Labs  Lab 06/17/19 0221 06/17/19 0221 06/18/19 0446 06/20/19 1104 06/21/19 0406 06/22/19 0410 06/23/19 0255  AST 29  --  38  --   --   --   --   ALT 22  --  22  --   --   --   --   ALKPHOS 100  --  102  --   --   --   --   BILITOT 0.3  --  0.5  --   --   --   --   PROT 5.8*  --  5.7*  --   --   --   --   ALBUMIN 2.4*   < > 2.2* 2.4* 2.2* 2.2* 2.4*   < > = values in this interval not displayed.        DVT prophylaxis: Lovenox  Code Status: Full code  Family Communication: No family at bedside    Status is: Inpatient  Dispo: The patient is from: Home              Anticipated d/c is to: Home              Anticipated d/c date is: 06/25/2019              Patient currently not medically stable for discharge.  Barrier to discharge-acute kidney injury, sigmoid diverticulitis  Pressure Injury 06/22/19 Buttocks Left;Medial Stage 2 -  Partial thickness loss of dermis presenting as a shallow open injury with a red, pink wound bed without slough. (Active)  06/22/19 1137  Location: Buttocks  Location Orientation: Left;Medial  Staging: Stage 2 -  Partial thickness loss of dermis presenting as a shallow open injury with a red, pink wound bed without slough.  Wound Description (Comments):   Present on Admission:        Scheduled medications:  . buPROPion  450 mg Oral Daily  . Chlorhexidine Gluconate Cloth  6 each Topical Daily  . citalopram  40 mg Oral Daily  . enoxaparin (LOVENOX) injection  30 mg Subcutaneous Q24H  . feeding supplement (NEPRO CARB STEADY)  237 mL Oral BID BM  . insulin  aspart  0-9 Units Subcutaneous Q4H  . metroNIDAZOLE  500 mg Oral Q8H  . pantoprazole  40 mg Oral Daily  . saccharomyces boulardii  250 mg Oral BID  . sodium chloride flush  10-40 mL Intracatheter Q12H  . sodium chloride flush  5 mL Intracatheter Q8H  Consultants:  Nephrology  ID  IR  General surgery  Procedures:    Antibiotics:   Anti-infectives (From admission, onward)   Start     Dose/Rate Route Frequency Ordered Stop   06/20/19 1400  metroNIDAZOLE (FLAGYL) tablet 500 mg     Discontinue     500 mg Oral Every 8 hours 06/20/19 0928     06/14/19 1330  cefTRIAXone (ROCEPHIN) 2 g in sodium chloride 0.9 % 100 mL IVPB     Discontinue     2 g 200 mL/hr over 30 Minutes Intravenous Every 24 hours 06/14/19 1316     06/14/19 1330  metroNIDAZOLE (FLAGYL) IVPB 500 mg  Status:  Discontinued        500 mg 100 mL/hr over 60 Minutes Intravenous Every 8 hours 06/14/19 1316 06/20/19 0928   06/13/19 2200  piperacillin-tazobactam (ZOSYN) IVPB 3.375 g  Status:  Discontinued        3.375 g 12.5 mL/hr over 240 Minutes Intravenous Every 12 hours 06/13/19 1111 06/14/19 1316   06/11/19 2330  piperacillin-tazobactam (ZOSYN) IVPB 3.375 g  Status:  Discontinued        3.375 g 12.5 mL/hr over 240 Minutes Intravenous Every 8 hours 06/11/19 1852 06/13/19 1111   06/11/19 1715  piperacillin-tazobactam (ZOSYN) IVPB 3.375 g        3.375 g 100 mL/hr over 30 Minutes Intravenous  Once 06/11/19 1714 06/11/19 1942       Objective   Vitals:   06/23/19 0800 06/23/19 1100 06/23/19 1200 06/23/19 1600  BP: 124/73  (!) 150/88   Pulse: 79 81 82   Resp: 17 20 16    Temp: 97.9 F (36.6 C)  97.9 F (36.6 C) 98.6 F (37 C)  TempSrc: Oral   Axillary  SpO2: 92% 95% 93%   Weight:      Height:        Intake/Output Summary (Last 24 hours) at 06/23/2019 1623 Last data filed at 06/23/2019 1300 Gross per 24 hour  Intake 272 ml  Output 2710 ml  Net -2438 ml    06/12 1901 - 06/14 0700 In: 671 [P.O.:666;  I.V.:5] Out: 3770 [Urine:3675; Drains:95]  Filed Weights   06/19/19 0720 06/19/19 1059 06/23/19 0359  Weight: (!) 180.1 kg (!) 176.6 kg (!) 180 kg    Physical Examination:  General-appears in no acute distress Heart-S1-S2, regular, no murmur auscultated Lungs-clear to auscultation bilaterally, no wheezing or crackles auscultated Abdomen-soft, mild tenderness noted in the right lower quadrant Extremities-no edema in the lower extremities Neuro-alert, oriented x3, no focal deficit noted   Data Reviewed:   Recent Results (from the past 240 hour(s))  MRSA PCR Screening     Status: None   Collection Time: 06/15/19  9:26 AM   Specimen: Nasopharyngeal  Result Value Ref Range Status   MRSA by PCR NEGATIVE NEGATIVE Final    Comment:        The GeneXpert MRSA Assay (FDA approved for NASAL specimens only), is one component of a comprehensive MRSA colonization surveillance program. It is not intended to diagnose MRSA infection nor to guide or monitor treatment for MRSA infections. Performed at [facility], [place] [address] [place], [place] [address]       [facility] If 7PM-7AM, please contact night-coverage at [email or web address], Office  [phone]   06/23/2019, 4:23 PM  LOS: 12 days

## 2019-06-23 NOTE — Progress Notes (Signed)
Patient ID: [name], female   DOB: [birth year], 59 y.o.   MRN: [id number]   divertic drain placed in IR 06/13/19 Drain draining little to none  CT 06/20/19: IMPRESSION: 1. New right lower quadrant pigtail drainage catheter within a now more organized gas and fluid collection in the right lower quadrant measuring 12.0 x 8.1 x 11.9 cm, larger in size when compared to the prior study. 2. New second smaller gas and fluid collection just superior to and separate from the bladder, measuring 5.0 x 3.8 cm. 3. Decreasing sigmoid inflammatory changes. 4. Unchanged hepatic steatosis.  Scheduled now for upsize- exchange  Pt is aware of procedure benefits and risks including but not limited to Infection; bleeding; damage to surrounding structures Agreeable to proceed Consent signed in chart

## 2019-06-23 NOTE — Progress Notes (Signed)
Patient ID: [name], female   DOB: 1960/03/07, 59 y.o.   MRN: [id number]         [facility]  Date of Admission:  06/11/2019   Total days of antibiotics 13         ASSESSMENT: She is improving clinically but has persistent diverticular abscesses.  PLAN: 1. Continue ceftriaxone and metronidazole 2. IR to upsize abscess drain today  Principal Problem:   Acute diverticulitis Active Problems:   Acute kidney injury (AKI) with acute tubular necrosis (ATN) (HCC)   Obesity (BMI 30-39.9)   ILD (interstitial lung disease) ([place])   Hypertension   Pressure injury of skin   Scheduled Meds: . buPROPion  450 mg Oral Daily  . Chlorhexidine Gluconate Cloth  6 each Topical Daily  . citalopram  40 mg Oral Daily  . enoxaparin (LOVENOX) injection  30 mg Subcutaneous Q24H  . feeding supplement (NEPRO CARB STEADY)  237 mL Oral BID BM  . insulin aspart  0-9 Units Subcutaneous Q4H  . metroNIDAZOLE  500 mg Oral Q8H  . pantoprazole  40 mg Oral Daily  . saccharomyces boulardii  250 mg Oral BID  . sodium chloride flush  10-40 mL Intracatheter Q12H  . sodium chloride flush  5 mL Intracatheter Q8H   Continuous Infusions: . cefTRIAXone (ROCEPHIN)  IV 2 g (06/23/19 1329)  . sodium chloride Stopped (06/12/19 0949)   PRN Meds:.acetaminophen **OR** acetaminophen, benzonatate, docusate sodium, gabapentin, heparin sodium (porcine), HYDROmorphone (DILAUDID) injection, labetalol, lidocaine, LORazepam, ondansetron **OR** ondansetron (ZOFRAN) IV, sodium chloride flush   SUBJECTIVE: She is feeling better.  Her right lower quadrant pain is improving.  Repeat CT of the abdomen and pelvis on 06/20/2019 showed:  IMPRESSION: 1. New right lower quadrant pigtail drainage catheter within a now more organized gas and fluid collection in the right lower quadrant measuring 12.0 x 8.1 x 11.9 cm, larger in size when compared to the prior study. 2. New second smaller gas and fluid collection  just superior to and separate from the bladder, measuring 5.0 x 3.8 cm. 3. Decreasing sigmoid inflammatory changes. 4. Unchanged hepatic steatosis.  Review of Systems: Review of Systems  Constitutional: Negative for chills, diaphoresis and fever.  Gastrointestinal: Positive for abdominal pain. Negative for nausea and vomiting.    Allergies  Allergen Reactions  . Guaifenesin Nausea And Vomiting and Other (See Comments)    un  . Mucinex [Guaifenesin Er] Nausea And Vomiting  . Sulfa Antibiotics Nausea And Vomiting    OBJECTIVE: Vitals:   06/23/19 0540 06/23/19 0800 06/23/19 1100 06/23/19 1200  BP: (!) 151/82 124/73  (!) 150/88  Pulse: 82 79 81 82  Resp: 15 17 20 16   Temp:  97.9 F (36.6 C)  97.9 F (36.6 C)  TempSrc:  Oral    SpO2: 94% 92% 95% 93%  Weight:      Height:       Body mass index is 60.34 kg/m.  Physical Exam Constitutional:      Comments: She looks like she is feeling much better than when I last saw her 8 days ago.  Abdominal:     Palpations: Abdomen is soft.     Tenderness: There is no abdominal tenderness.     Comments: She had 65 cc of purulent material out from her right lower quadrant drain yesterday.     Lab Results Lab Results  Component Value Date   WBC 14.6 (H) 06/23/2019   HGB 9.8 (L) 06/23/2019  HCT 31.9 (L) 06/23/2019   MCV 86.0 06/23/2019   PLT 247 06/23/2019    Lab Results  Component Value Date   CREATININE 3.81 (H) 06/23/2019   BUN 27 (H) 06/23/2019   NA 141 06/23/2019   K 3.4 (L) 06/23/2019   CL 104 06/23/2019   CO2 23 06/23/2019    Lab Results  Component Value Date   ALT 22 06/18/2019   AST 38 06/18/2019   ALKPHOS 102 06/18/2019   BILITOT 0.5 06/18/2019     Microbiology: Recent Results (from the past 240 hour(s))  MRSA PCR Screening     Status: None   Collection Time: 06/15/19  9:26 AM   Specimen: Nasopharyngeal  Result Value Ref Range Status   MRSA by PCR NEGATIVE NEGATIVE Final    Comment:        The  GeneXpert MRSA Assay (FDA approved for NASAL specimens only), is one component of a comprehensive MRSA colonization surveillance program. It is not intended to diagnose MRSA infection nor to guide or monitor treatment for MRSA infections. Performed at [facility], [place] [address], [place], [place] [address]     [name], [place] [facility] [phone] pager   [phone] cell 06/23/2019, 3:35 PM

## 2019-06-23 NOTE — Progress Notes (Signed)
Physical Therapy Treatment Patient Details Name: [name] MRN: [id number] DOB: [birth year] Today's Date: 06/23/2019    History of Present Illness 59 y.o. 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 She presented to the ED with complaints of marked lower quadrant pain. Pt admitted for sigmoid diverticulitis with perforation and abscess. Drain placed 6/4.    PT Comments    Continuing work on functional mobility and activity tolerance;  Ms. Croston is dependent on momentum for bed mobility and transfers; Got up on R side of the bed today, which she reports is more difficult than L side of bed; Able to stand for 3-5 minutes with RW support and able to march in place and sidestep towards EOB; Notable BP drop with incr time standing, she was asymptomatic for dizziness; BPs as follows:    06/23/19 1040  Orthostatic Sitting  BP- Sitting 124/73  Pulse- Sitting 85  Orthostatic Standing at 3 minutes  BP- Standing at 3 minutes (!) 87/75  Pulse- Standing at 3 minutes 95   Assisted her back to bed and last BP of session was 115/54 supine in bed    Follow Up Recommendations  Home health PT;Supervision/Assistance - 24 hour     Equipment Recommendations  None recommended by PT    Recommendations for Other Services       Precautions / Restrictions Precautions Precautions: Fall;Other (comment) Precaution Comments: often incontinent of bowels Other Brace: R LQ drain    Mobility  Bed Mobility Overal bed mobility: Needs Assistance Bed Mobility: Rolling;Sidelying to Sit     Supine to sit: Mod assist Sit to supine: Min guard   General bed mobility comments: Mod  assist to pull to sidelying to reach the rail and then pull up to sitting with one hand on rail and free hand pulling against therapist while bed is being deflated.  Got up on R side of bed, which was  more difficult than getting up on the L per pt  Transfers Overall transfer level: Needs assistance Equipment used: Rolling walker (2 wheeled) Transfers: Sit to/from Stand Sit to Stand: Min assist         General transfer comment: Heavy dependence on momentum to stand; Min assist to lean forward and to steady  Ambulation/Gait Ambulation/Gait assistance: Herbalist (Feet):  (sidesteps toward [facility]) Assistive device: Rolling walker (2 wheeled)       General Gait Details: Sidestepping with RW bedside   Stairs             Wheelchair Mobility    Modified Rankin (Stroke Patients Only)       Balance     Sitting balance-Leahy Scale: Fair       Standing balance-Leahy Scale: Poor Standing balance comment: reliant on at least one UE supported on RW                            Cognition Arousal/Alertness: Awake/alert Behavior During Therapy: [facility] for tasks assessed/performed Overall Cognitive Status: Within Functional Limits for tasks assessed (for simple mobility tasks)                                        Exercises      General Comments General comments (skin integrity, edema, etc.):  Notable BP drop between sitting and standing; RN notified      Pertinent Vitals/Pain Pain Assessment: Faces Faces Pain Scale: Hurts even more Pain Location: abdomen (R lower quadrant) Pain Descriptors / Indicators: Cramping;Sharp Pain Intervention(s): Monitored during session    Home Living                      Prior Function            PT Goals (current goals can now be found in the care plan section) Acute Rehab PT Goals Patient Stated Goal: that her drain replacement goes well PT Goal Formulation: With patient Time For Goal Achievement: 06/30/19 Potential to Achieve Goals: Good Progress towards PT goals: Progressing toward goals    Frequency    Min 3X/week      PT Plan Current plan remains appropriate     Co-evaluation              AM-PAC PT "6 Clicks" Mobility   Outcome Measure  Help needed turning from your back to your side while in a flat bed without using bedrails?: A Little Help needed moving from lying on your back to sitting on the side of a flat bed without using bedrails?: A Little Help needed moving to and from a bed to a chair (including a wheelchair)?: A Little Help needed standing up from a chair using your arms (e.g., wheelchair or bedside chair)?: A Little Help needed to walk in hospital room?: A Little Help needed climbing 3-5 steps with a railing? : A Lot 6 Click Score: 17    End of Session   Activity Tolerance: Other (comment) (limited by BP drop in standing) Patient left: in bed;with call bell/phone within reach (going for procedure) Nurse Communication: Mobility status (BP drop in standing) PT Visit Diagnosis: Muscle weakness (generalized) (M62.81);Difficulty in walking, not elsewhere classified (R26.2)     Time: 5093-2671 PT Time Calculation (min) (ACUTE ONLY): 24 min  Charges:  $Therapeutic Activity: 23-37 mins                     Roney Marion, [place]  Acute Rehabilitation Services Pager [phone] Office [place] 06/23/2019, 1:43 PM

## 2019-06-23 NOTE — Plan of Care (Signed)
Patient A&O x4. VSS. Free from falls. Pt turns self in bed. Patient worked with PT today. Pt tolerating fluids and meals.Pt voiding adequately during shift. RLQ drain in place, brown/Tan drainage.Q8 flush done per orders with 86ml of NS. DVT prophylactic: lovenox. No c/o of pain. BG managed. Bed wheels locked. Phone and call bell within reach. Pt is resting, no distress. POC reviewed.  NPO at midnight, patient going to IR for drain upsize 6/14  Problem: Education: Goal: Knowledge of General Education information will improve Description: Including pain rating scale, medication(s)/side effects and non-pharmacologic comfort measures Outcome: Progressing   Problem: Health Behavior/Discharge Planning: Goal: Ability to manage health-related needs will improve Outcome: Progressing   Problem: Clinical Measurements: Goal: Ability to maintain clinical measurements within normal limits will improve Outcome: Progressing Goal: Will remain free from infection Outcome: Progressing Goal: Diagnostic test results will improve Outcome: Progressing Goal: Respiratory complications will improve Outcome: Progressing Goal: Cardiovascular complication will be avoided Outcome: Progressing   Problem: Activity: Goal: Risk for activity intolerance will decrease Outcome: Progressing   Problem: Coping: Goal: Level of anxiety will decrease Outcome: Progressing   Problem: Elimination: Goal: Will not experience complications related to bowel motility Outcome: Progressing Goal: Will not experience complications related to urinary retention Outcome: Progressing   Problem: Pain Managment: Goal: General experience of comfort will improve Outcome: Progressing

## 2019-06-23 NOTE — Progress Notes (Signed)
[facility] ROUNDING NOTE   Subjective:   This is a 59 year old female with history of diabetes hypertension and her associated vasculitis followed by rheumatologist and taking CellCept as well as methotrexate.  She has a history of interstitial lung disease anxiety depression.  She was admitted with  sigmoid diverticulitis and perforation status post IR drain placement.  Baseline renal function appears to be in the normal range with a creatinine about 1.86 in November 2019 and 0.98   06/11/2019.  She developed acute kidney injury secondary to acute tubular necrosis with hypotension Entresto infections and contrast use.  CT scan of abdomen and pelvis did not reveal any evidence of obstruction.  She developed severe azotemia and required dialysis.  She received a last dialysis treatment 06/20/2019.  We are continuing to monitor for recovery.  Her urine output has been excellent over the weekend.  06/21/2019 2.2 L.  06/22/2019 2.6 L 06/23/2019 1.5 L.  Blood pressure 124/73 pulse 79 temperature 97.9 O2 sats 96% room air  Sodium 141 potassium 3.4 chloride 104 CO2 23 BUN 27 creatinine 3.81 glucose 113 calcium 8.1 phosphorus 5.2 albumin four 2.4 hemoglobin 9.8 WBC 14.6  Wellbutrin 450 mg daily, Celexa 40 mg daily Lovenox 30 mg every 24 hours, insulin sliding scale, Flagyl 500 mg every 8 hours  IV Rocephin 2 g every 24 hours  Objective:  Vital signs in last 24 hours:  Temp:  [97.9 F (36.6 C)-98.9 F (37.2 C)] 97.9 F (36.6 C) (06/14 0800) Pulse Rate:  [77-87] 79 (06/14 0800) Resp:  [13-17] 17 (06/14 0800) BP: (124-162)/(72-95) 124/73 (06/14 0800) SpO2:  [92 %-95 %] 92 % (06/14 0800) Weight:  [180 kg] 180 kg (06/14 0359)  Weight change:  Filed Weights   06/19/19 0720 06/19/19 1059 06/23/19 0359  Weight: (!) 180.1 kg (!) 176.6 kg (!) 180 kg    Intake/Output: I/O last 3 completed shifts: In: 671 [P.O.:666; I.V.:5] Out: 3770 [Urine:3675; Drains:95]   Intake/Output this shift:   Total I/O In: 50 [P.O.:50] Out: 450 [Urine:450]  General:NAD, comfortable Heart:RRR, s1s2 nl, no rubs Lungs: Distant breath sound, no wheezing Abdomen:soft, non-distended, right lower quadrant drain Extremities: No LE edema Neurology: Alert awake and following commands, no asterixis   Basic Metabolic Panel: Recent Labs  Lab 06/18/19 0446 06/18/19 0446 06/19/19 0446 06/19/19 0446 06/20/19 1104 06/20/19 1104 06/21/19 0406 06/22/19 0410 06/23/19 0255  NA 134*   < > 135  --  137  --  139 139 141  K 3.6   < > 3.3*  --  3.3*  --  3.2* 3.4* 3.4*  CL 101   < > 101  --  101  --  100 101 104  CO2 19*   < > 20*  --  23  --  25 26 23   GLUCOSE 109*   < > 111*  --  138*  --  103* 113* 113*  BUN 62*   < > 40*  --  25*  --  27* 29* 27*  CREATININE 7.63*   < > 5.70*  --  4.48*  --  4.59* 4.43* 3.81*  CALCIUM 7.8*   < > 8.0*   < > 8.2*   < > 8.0* 8.2* 8.1*  MG 2.1  --   --   --   --   --   --   --   --   PHOS  --   --   --   --  4.9*  --  5.9*  6.0* 5.2*   < > = values in this interval not displayed.    Liver Function Tests: Recent Labs  Lab 06/17/19 0221 06/17/19 0221 06/18/19 0446 06/20/19 1104 06/21/19 0406 06/22/19 0410 06/23/19 0255  AST 29  --  38  --   --   --   --   ALT 22  --  22  --   --   --   --   ALKPHOS 100  --  102  --   --   --   --   BILITOT 0.3  --  0.5  --   --   --   --   PROT 5.8*  --  5.7*  --   --   --   --   ALBUMIN 2.4*   < > 2.2* 2.4* 2.2* 2.2* 2.4*   < > = values in this interval not displayed.   No results for input(s): LIPASE, AMYLASE in the last 168 hours. No results for input(s): AMMONIA in the last 168 hours.  CBC: Recent Labs  Lab 06/17/19 0221 06/18/19 0446 06/19/19 0446 06/21/19 0406 06/23/19 0255  WBC 13.0* 13.2* 14.3* 15.2* 14.6*  HGB 9.3* 9.5* 9.7* 9.4* 9.8*  HCT 29.7* 30.5* 31.8* 31.5* 31.9*  MCV 85.3 84.7 84.8 86.8 86.0  PLT 275 280 306 271 247    Cardiac Enzymes: No results for input(s): CKTOTAL, CKMB, CKMBINDEX,  TROPONINI in the last 168 hours.  BNP: Invalid input(s): POCBNP  CBG: Recent Labs  Lab 06/22/19 0357 06/22/19 0800 06/22/19 1547 06/22/19 2329 06/23/19 0358  GLUCAP 111* 98 103* 128* 119*    Microbiology: Results for orders placed or performed during the hospital encounter of 06/11/19  SARS Coronavirus 2 by RT PCR (hospital order, performed in [facility] hospital lab) Nasopharyngeal Nasopharyngeal Swab     Status: None   Collection Time: 06/11/19  6:16 PM   Specimen: Nasopharyngeal Swab  Result Value Ref Range Status   SARS Coronavirus 2 NEGATIVE NEGATIVE Final    Comment: (NOTE) SARS-CoV-2 target nucleic acids are NOT DETECTED. The SARS-CoV-2 RNA is generally detectable in upper and lower respiratory specimens during the acute phase of infection. The lowest concentration of SARS-CoV-2 viral copies this assay can detect is 250 copies / mL. A negative result does not preclude SARS-CoV-2 infection and should not be used as the sole basis for treatment or other patient management decisions.  A negative result may occur with improper specimen collection / handling, submission of specimen other than nasopharyngeal swab, presence of viral mutation(s) within the areas targeted by this assay, and inadequate number of viral copies (<250 copies / mL). A negative result must be combined with clinical observations, patient history, and epidemiological information. Fact Sheet for Patients:   StrictlyIdeas.no Fact Sheet for Healthcare Providers: BankingDealers.co.za This test is not yet approved or cleared  by the [place] FDA and has been authorized for detection and/or diagnosis of SARS-CoV-2 by FDA under an Emergency Use Authorization (EUA).  This EUA will remain in effect (meaning this test can be used) for the duration of the COVID-19 declaration under Section 564(b)(1) of the Act, 21 U.S.C. section 360bbb-3(b)(1), unless the  authorization is terminated or revoked sooner. Performed at [facility], [place] [address] [place], [place] [address]   Culture, blood (Routine X 2) w Reflex to ID Panel     Status: Abnormal   Collection Time: 06/11/19  8:20 PM   Specimen: BLOOD  Result Value Ref Range Status  Specimen Description BLOOD BLOOD LEFT FOREARM  Final   Special Requests   Final    BOTTLES DRAWN AEROBIC AND ANAEROBIC Blood Culture results may not be optimal due to an inadequate volume of blood received in culture bottles   Culture  Setup Time (A)  Final    GRAM VARIABLE ROD ANAEROBIC BOTTLE ONLY CRITICAL RESULT CALLED TO, READ BACK BY AND VERIFIED WITH: PHARMD J FRENS AT 0815 06/16/19 BY L BENFIELD    Culture (A)  Final    BACTEROIDES SPECIES BETA LACTAMASE NEGATIVE Performed at [facility], [address] [place], [place] [address]    Report Status 06/20/2019 FINAL  Final  Blood Culture ID Panel (Reflexed)     Status: None   Collection Time: 06/11/19  8:20 PM  Result Value Ref Range Status   Enterococcus species NOT DETECTED NOT DETECTED Final   Listeria monocytogenes NOT DETECTED NOT DETECTED Final   Staphylococcus species NOT DETECTED NOT DETECTED Final   Staphylococcus aureus (BCID) NOT DETECTED NOT DETECTED Final   Streptococcus species NOT DETECTED NOT DETECTED Final   Streptococcus agalactiae NOT DETECTED NOT DETECTED Final   Streptococcus pneumoniae NOT DETECTED NOT DETECTED Final   Streptococcus pyogenes NOT DETECTED NOT DETECTED Final   Acinetobacter baumannii NOT DETECTED NOT DETECTED Final   Enterobacteriaceae species NOT DETECTED NOT DETECTED Final   Enterobacter cloacae complex NOT DETECTED NOT DETECTED Final   Escherichia coli NOT DETECTED NOT DETECTED Final   Klebsiella oxytoca NOT DETECTED NOT DETECTED Final   Klebsiella pneumoniae NOT DETECTED NOT DETECTED Final   Proteus species NOT DETECTED NOT DETECTED Final   Serratia marcescens NOT DETECTED NOT DETECTED Final    Haemophilus influenzae NOT DETECTED NOT DETECTED Final   Neisseria meningitidis NOT DETECTED NOT DETECTED Final   Pseudomonas aeruginosa NOT DETECTED NOT DETECTED Final   Candida albicans NOT DETECTED NOT DETECTED Final   Candida glabrata NOT DETECTED NOT DETECTED Final   Candida krusei NOT DETECTED NOT DETECTED Final   Candida parapsilosis NOT DETECTED NOT DETECTED Final   Candida tropicalis NOT DETECTED NOT DETECTED Final    Comment: Performed at [facility], [address], [place], [place] [address]  Culture, blood (Routine X 2) w Reflex to ID Panel     Status: None   Collection Time: 06/11/19 10:00 PM   Specimen: BLOOD  Result Value Ref Range Status   Specimen Description BLOOD RIGHT HAND  Final   Special Requests   Final    BOTTLES DRAWN AEROBIC ONLY Blood Culture results may not be optimal due to an inadequate volume of blood received in culture bottles   Culture   Final    NO GROWTH 5 DAYS Performed at [facility], [place] [address] [place], [place] [address]    Report Status 06/16/2019 FINAL  Final  MRSA PCR Screening     Status: None   Collection Time: 06/15/19  9:26 AM   Specimen: Nasopharyngeal  Result Value Ref Range Status   MRSA by PCR NEGATIVE NEGATIVE Final    Comment:        The GeneXpert MRSA Assay (FDA approved for NASAL specimens only), is one component of a comprehensive MRSA colonization surveillance program. It is not intended to diagnose MRSA infection nor to guide or monitor treatment for MRSA infections. Performed at [facility], [place] [address] [place], [place] [address]     Coagulation Studies: No results for input(s): LABPROT, INR in the last 72  hours.  Urinalysis: No results for input(s): COLORURINE, LABSPEC, PHURINE, GLUCOSEU, HGBUR, BILIRUBINUR, KETONESUR, PROTEINUR, UROBILINOGEN, NITRITE, LEUKOCYTESUR in the last 72 hours.  Invalid input(s): APPERANCEUR    Imaging: No results found.   Medications:   .  cefTRIAXone (ROCEPHIN)  IV 2 g (06/22/19 1308)  . sodium chloride Stopped (06/12/19 0949)   . buPROPion  450 mg Oral Daily  . Chlorhexidine Gluconate Cloth  6 each Topical Daily  . citalopram  40 mg Oral Daily  . enoxaparin (LOVENOX) injection  30 mg Subcutaneous Q24H  . feeding supplement (NEPRO CARB STEADY)  237 mL Oral BID BM  . insulin aspart  0-9 Units Subcutaneous Q4H  . metroNIDAZOLE  500 mg Oral Q8H  . pantoprazole  40 mg Oral Daily  . saccharomyces boulardii  250 mg Oral BID  . sodium chloride flush  10-40 mL Intracatheter Q12H  . sodium chloride flush  5 mL Intracatheter Q8H   acetaminophen **OR** acetaminophen, benzonatate, docusate sodium, gabapentin, heparin sodium (porcine), HYDROmorphone (DILAUDID) injection, labetalol, lidocaine, LORazepam, ondansetron **OR** ondansetron (ZOFRAN) IV, sodium chloride flush  Assessment/ Plan:   Acute kidney injury secondary to acute tubular necrosis hypertension Entresto used infection contrast nephropathy.  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  CT scan did not show any evidence of obstruction last dialysis treatment was 06/20/2019.  Urine output has improved I doubt she will need further dialysis.  I would recommend removal of dialysis catheter.  Hypertension/volume appears to be stable.  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  Status post IR drain continue antibiotics PT mobilization.  Appreciate the assistance of Dr. Rosendo Gros   LOS: [place] @TODAY @9 :23 AM

## 2019-06-23 NOTE — Progress Notes (Signed)
[facility] Progress Note     Subjective: Patient reports pain in RLQ and suprapubic abdomen with passing stool and flatus. Pain is cramping and then more sharp. She is tolerating a soft diet. Discussed overall pathology of diverticulitis for a while today.   Objective: Vital signs in last 24 hours: Temp:  [97.9 F (36.6 C)-98.9 F (37.2 C)] 97.9 F (36.6 C) (06/14 0800) Pulse Rate:  [77-87] 79 (06/14 0800) Resp:  [13-17] 17 (06/14 0800) BP: (124-162)/(72-95) 124/73 (06/14 0800) SpO2:  [92 %-95 %] 92 % (06/14 0800) Weight:  [180 kg] 180 kg (06/14 0359) Last BM Date: 06/22/19  Intake/Output from previous day: 06/13 0701 - 06/14 0700 In: 671 [P.O.:666; I.V.:5] Out: 2690 [Urine:2625; Drains:65] Intake/Output this shift: Total I/O In: 44 [P.O.:50] Out: 450 [Urine:450]  PE: General: pleasant, WD, morbidly obese female who is laying in bed in NAD HEENT: Sclera are noninjected.  PERRL.  Ears and nose without any masses or lesions.  Mouth is pink and moist Heart: regular, rate, and rhythm.  Normal s1,s2. No obvious murmurs, gallops, or rubs noted.  Palpable radial and pedal pulses bilaterally Lungs: CTAB, no wheezes, rhonchi, or rales noted.  Respiratory effort nonlabored Abd: soft, mild ttp in RLQ, drain in RLQ with purulent drainage, ND Skin: warm and dry with no masses, lesions, or rashes Psych: A&Ox3 with an appropriate affect.   Lab Results:  Recent Labs    06/21/19 0406 06/23/19 0255  WBC 15.2* 14.6*  HGB 9.4* 9.8*  HCT 31.5* 31.9*  PLT 271 247   BMET Recent Labs    06/22/19 0410 06/23/19 0255  NA 139 141  K 3.4* 3.4*  CL 101 104  CO2 26 23  GLUCOSE 113* 113*  BUN 29* 27*  CREATININE 4.43* 3.81*  CALCIUM 8.2* 8.1*   PT/INR No results for input(s): LABPROT, INR in the last 72 hours. CMP     Component Value Date/Time   NA 141 06/23/2019 0255   NA 139 12/24/2017 1641   K 3.4 (L) 06/23/2019 0255   CL 104 06/23/2019 0255   CO2 23  06/23/2019 0255   GLUCOSE 113 (H) 06/23/2019 0255   BUN 27 (H) 06/23/2019 0255   BUN 15 12/24/2017 1641   CREATININE 3.81 (H) 06/23/2019 0255   CALCIUM 8.1 (L) 06/23/2019 0255   PROT 5.7 (L) 06/18/2019 0446   ALBUMIN 2.4 (L) 06/23/2019 0255   AST 38 06/18/2019 0446   ALT 22 06/18/2019 0446   ALKPHOS 102 06/18/2019 0446   BILITOT 0.5 06/18/2019 0446   GFRNONAA 12 (L) 06/23/2019 0255   GFRAA 14 (L) 06/23/2019 0255   Lipase     Component Value Date/Time   LIPASE 15 06/11/2019 1243       Studies/Results: No results found.  Anti-infectives: Anti-infectives (From admission, onward)   Start     Dose/Rate Route Frequency Ordered Stop   06/20/19 1400  metroNIDAZOLE (FLAGYL) tablet 500 mg     Discontinue     500 mg Oral Every 8 hours 06/20/19 0928     06/14/19 1330  cefTRIAXone (ROCEPHIN) 2 g in sodium chloride 0.9 % 100 mL IVPB     Discontinue     2 g 200 mL/hr over 30 Minutes Intravenous Every 24 hours 06/14/19 1316     06/14/19 1330  metroNIDAZOLE (FLAGYL) IVPB 500 mg  Status:  Discontinued        500 mg 100 mL/hr over 60 Minutes Intravenous Every 8 hours 06/14/19 1316 06/20/19 0928  06/13/19 2200  piperacillin-tazobactam (ZOSYN) IVPB 3.375 g  Status:  Discontinued        3.375 g 12.5 mL/hr over 240 Minutes Intravenous Every 12 hours 06/13/19 1111 06/14/19 1316   06/11/19 2330  piperacillin-tazobactam (ZOSYN) IVPB 3.375 g  Status:  Discontinued        3.375 g 12.5 mL/hr over 240 Minutes Intravenous Every 8 hours 06/11/19 1852 06/13/19 1111   06/11/19 1715  piperacillin-tazobactam (ZOSYN) IVPB 3.375 g        3.375 g 100 mL/hr over 30 Minutes Intravenous  Once 06/11/19 1714 06/11/19 1942       Assessment/Plan 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 Currently undergoing HD.Nephrology following Urinary Retention - Foley placed  6/4 Malnutrition - prealbumin 6.7  Acute sigmoid diverticulitis with perforationand abscess - 2nd bout,history of diverticulitis in 2014 - last colonoscopy >10 years ago - CT scan6/3showedprogression of acute sigmoid diverticulitis now with evidence of perforation, increasing fluid in the right lower quadrant with gas fluid level, no rim enhancement to suggest organized fluid collection or abscess at this time - S/p IR drain on 6/4, upsizing drain today - Continue abx. Per ID (switched to rocephin/flagyl due to worsening renal failure) - Continue PT, mobilize.  ID- zosyn 6/2-6/5, rocephin/flagyl 6/5>> FEN -IVF,soft VTE -SCDs, lovenox Foley -in place Follow up -TBD  Plan:Cont' abx, IR today. Ok to resume soft diet after IR drain later today.  LOS: 12 days    Norm Parcel , [facility] 06/23/2019, 11:14 AM Please see Amion for pager number during day hours 7:00am-4:30pm

## 2019-06-24 ENCOUNTER — Inpatient Hospital Stay (HOSPITAL_COMMUNITY): Payer: Commercial Managed Care - PPO

## 2019-06-24 DIAGNOSIS — D849 Immunodeficiency, unspecified: Secondary | ICD-10-CM

## 2019-06-24 HISTORY — PX: IR CATHETER TUBE CHANGE: IMG717

## 2019-06-24 LAB — RENAL FUNCTION PANEL
Albumin: 2.4 g/dL — ABNORMAL LOW (ref 3.5–5.0)
Anion gap: 11 (ref 5–15)
BUN: 24 mg/dL — ABNORMAL HIGH (ref 6–20)
CO2: 24 mmol/L (ref 22–32)
Calcium: 8.1 mg/dL — ABNORMAL LOW (ref 8.9–10.3)
Chloride: 105 mmol/L (ref 98–111)
Creatinine, Ser: 2.83 mg/dL — ABNORMAL HIGH (ref 0.44–1.00)
GFR calc Af Amer: 20 mL/min — ABNORMAL LOW (ref >60)
GFR calc non Af Amer: 18 mL/min — ABNORMAL LOW (ref >60)
Glucose, Bld: 118 mg/dL — ABNORMAL HIGH (ref 70–99)
Phosphorus: 4 mg/dL (ref 2.5–4.6)
Potassium: 3.5 mmol/L (ref 3.5–5.1)
Sodium: 140 mmol/L (ref 135–145)

## 2019-06-24 LAB — GLUCOSE, CAPILLARY
Glucose-Capillary: 115 mg/dL — ABNORMAL HIGH (ref 70–99)
Glucose-Capillary: 104 mg/dL — ABNORMAL HIGH (ref 70–99)
Glucose-Capillary: 114 mg/dL — ABNORMAL HIGH (ref 70–99)
Glucose-Capillary: 100 mg/dL — ABNORMAL HIGH (ref 70–99)

## 2019-06-24 MED ORDER — FENTANYL CITRATE (PF) 100 MCG/2ML IJ SOLN
INTRAMUSCULAR | Status: AC | PRN
  Administered 2019-06-24: 25 ug via INTRAVENOUS

## 2019-06-24 MED ORDER — LIDOCAINE HCL 1 % IJ SOLN
INTRAMUSCULAR | Status: AC | PRN
  Administered 2019-06-24: 5 mL

## 2019-06-24 MED ORDER — MIDAZOLAM HCL 2 MG/2ML IJ SOLN
INTRAMUSCULAR | Status: AC | PRN
  Administered 2019-06-24 (×2): 0.5 mg via INTRAVENOUS

## 2019-06-24 MED ORDER — ENOXAPARIN SODIUM 40 MG/0.4ML ~~LOC~~ SOLN
40.0000 mg | SUBCUTANEOUS | Status: DC
  Administered 2019-06-24: 40 mg via SUBCUTANEOUS
  Filled 2019-06-24: qty 0.4

## 2019-06-24 MED ORDER — MIDAZOLAM HCL 2 MG/2ML IJ SOLN
INTRAMUSCULAR | Status: AC
  Filled 2019-06-24: qty 2

## 2019-06-24 MED ORDER — IOHEXOL 300 MG/ML  SOLN
50.0000 mL | Freq: Once | INTRAMUSCULAR | Status: AC | PRN
  Administered 2019-06-24: 10 mL

## 2019-06-24 MED ORDER — LIDOCAINE HCL 1 % IJ SOLN
INTRAMUSCULAR | Status: AC
  Filled 2019-06-24: qty 20

## 2019-06-24 MED ORDER — CARVEDILOL 12.5 MG PO TABS
12.5000 mg | ORAL_TABLET | Freq: Two times a day (BID) | ORAL | Status: DC
  Administered 2019-06-24 – 2019-06-30 (×12): 12.5 mg via ORAL
  Filled 2019-06-24 (×12): qty 1

## 2019-06-24 MED ORDER — FENTANYL CITRATE (PF) 100 MCG/2ML IJ SOLN
INTRAMUSCULAR | Status: AC
  Filled 2019-06-24: qty 2

## 2019-06-24 NOTE — Progress Notes (Signed)
OT Cancellation Note  Patient Details Name: [name] MRN: [id number] DOB: 1960/11/30   Cancelled Treatment:    Reason Eval/Treat Not Completed: Patient at procedure or test/ unavailable (currently in IR). Will follow up as able.  Lou Cal, OT Acute Rehabilitation Services Pager [phone] Office [phone]   [name] 06/24/2019, 11:32 AM

## 2019-06-24 NOTE — Progress Notes (Signed)
[facility]  PROGRESS NOTE  [name] SVX:[id number] DOB: Mar 31, 1960 DOA: 06/11/2019 PCP: Christain Sacramento, MD   Brief HPI:   59 year old female with history of prior sigmoid diverticulitis with perforation in 2014, history of ILD on chronic suppression, diabetes mellitus type 2 on Metformin, obesity, hypertension came to ED with complaints of abdominal pain.  In the ED CT abdomen pelvis showed sigmoid diverticulitis.  Patient was started on IV Zosyn.  Hospitalist was consulted for admission. Repeat CT abdomen pelvis shows new right lower quadrant pigtail drainage catheter within a new more organized gas and fluid collection in the right lower quadrant measuring 12 x 8.1 x 11.9 cm.  Large in size as compared to previous study.  IR has been reconsulted by general surgery for replacing the drain.    Subjective   Patient seen and examined, plan for upsizing the right lower quadrant catheter by IR today.   Assessment/Plan:     1. Acute sigmoid diverticulitis-patient initially presented with sigmoid vasculitis seen on CT.  As patient had worsening symptoms repeat CT was done which showed possibility of perforation without abscess collection.  General surgery was recommended who recommended IR for drain placement.  Patient had drain placement on 06/13/2019 with purulent material expressed.  WBC is 15,000.  She was initially on IV Zosyn however was changed to Rocephin and Flagyl.  ID and general surgery following.  CT abdomen pelvis ordered per general surgery.  Shows large collection which is larger than previous CT scan.  IR reconsulted per general surgery.  Plan for replacing the drain with JP drain today.  Blood culture growing gram-variable rods.  Final result is pending.  ID is following. 2. Orthostatic hypotension-noted per physical therapy, blood pressure dropped from 124/73-87/75 on standing.  Patient is not on antihypertensive medication at this time.  Will order TED hose bilaterally.   Also check orthostatic vital signs every 12 hours. 3. Acute kidney injury-suspected ATN, urine presentation showed no blood or protein.  Likely induced by hypotension, contrast-induced nephropathy and Entresto.  Nephrology following.  Patient was started on hemodialysis for uremic symptoms.  Creatinine slowly improving, today creatinine 2.83.  Patient now making urine. 4. 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  She is on maintenance with CellCept and methotrexate which are currently on hold secondary to sepsis.  Previously treated with corticosteroids/Plex and rituximab. 5. Diabetes mellitus type 2-hemoglobin A1c 7.0.  Oral hypo-glycemic agents on hold.  Continue sliding scale insulin with NovoLog. 6. Hypokalemia-replete.  Today potassium is 3.5. 7. Hypertension-Entresto has been stopped.  Patient was taking Coreg at home.  Currently she is on IV labetalol 20 mg IV every 2 hours as needed for SBP more than 160.  Blood pressure is elevated, will restart Coreg at 12.5 mg p.o. twice daily 8. Morbid obesity    SpO2: 100 % O2 Flow Rate (L/min): 2 L/min   COVID-19 Labs  No results for input(s): DDIMER, FERRITIN, LDH, CRP in the last 72 hours.  Lab Results  Component Value Date   SARSCOV2NAA NEGATIVE 06/11/2019   [place] NEGATIVE 05/30/2019     CBG: Recent Labs  Lab 06/23/19 0358 06/23/19 1558 06/23/19 2355 06/24/19 0403 06/24/19 0825  GLUCAP 119* 98 113* 115* 104*    CBC: Recent Labs  Lab 06/18/19 0446 06/19/19 0446 06/21/19 0406 06/23/19 0255  WBC 13.2* 14.3* 15.2* 14.6*  HGB 9.5* 9.7* 9.4* 9.8*  HCT 30.5* 31.8* 31.5* 31.9*  MCV 84.7 84.8 86.8 86.0  PLT  280 306 271 226    Basic Metabolic Panel: Recent Labs  Lab 06/18/19 0446 06/19/19 0446 06/20/19 1104 06/21/19 0406 06/22/19 0410 06/23/19 0255 06/24/19 0404  NA 134*   < > 137 139 139 141 140  K 3.6   < > 3.3* 3.2* 3.4* 3.4* 3.5  CL 101   < > 101 100 101 104  105  CO2 19*   < > 23 25 26 23 24   GLUCOSE 109*   < > 138* 103* 113* 113* 118*  BUN 62*   < > 25* 27* 29* 27* 24*  CREATININE 7.63*   < > 4.48* 4.59* 4.43* 3.81* 2.83*  CALCIUM 7.8*   < > 8.2* 8.0* 8.2* 8.1* 8.1*  MG 2.1  --   --   --   --   --   --   PHOS  --   --  4.9* 5.9* 6.0* 5.2* 4.0   < > = values in this interval not displayed.     Liver Function Tests: Recent Labs  Lab 06/18/19 0446 06/18/19 0446 06/20/19 1104 06/21/19 0406 06/22/19 0410 06/23/19 0255 06/24/19 0404  AST 38  --   --   --   --   --   --   ALT 22  --   --   --   --   --   --   ALKPHOS 102  --   --   --   --   --   --   BILITOT 0.5  --   --   --   --   --   --   PROT 5.7*  --   --   --   --   --   --   ALBUMIN 2.2*   < > 2.4* 2.2* 2.2* 2.4* 2.4*   < > = values in this interval not displayed.        DVT prophylaxis: Lovenox  Code Status: Full code  Family Communication: No family at bedside    Status is: Inpatient  Dispo: The patient is from: Home              Anticipated d/c is to: Home              Anticipated d/c date is: 06/26/2019              Patient currently not medically stable for discharge.  Barrier to discharge-acute kidney injury, sigmoid diverticulitis  Pressure Injury 06/22/19 Buttocks Left;Medial Stage 2 -  Partial thickness loss of dermis presenting as a shallow open injury with a red, pink wound bed without slough. (Active)  06/22/19 1137  Location: Buttocks  Location Orientation: Left;Medial  Staging: Stage 2 -  Partial thickness loss of dermis presenting as a shallow open injury with a red, pink wound bed without slough.  Wound Description (Comments):   Present on Admission:        Scheduled medications:  . buPROPion  450 mg Oral Daily  . Chlorhexidine Gluconate Cloth  6 each Topical Daily  . citalopram  40 mg Oral Daily  . enoxaparin (LOVENOX) injection  40 mg Subcutaneous Q24H  . feeding supplement (NEPRO CARB STEADY)  237 mL Oral BID BM  . fentaNYL       . insulin aspart  0-9 Units Subcutaneous Q4H  . lidocaine      . metroNIDAZOLE  500 mg Oral Q8H  . midazolam      . pantoprazole  40 mg Oral  Daily  . saccharomyces boulardii  250 mg Oral BID  . sodium chloride flush  10-40 mL Intracatheter Q12H  . sodium chloride flush  5 mL Intracatheter Q8H    Consultants:  Nephrology  ID  IR  General surgery  Procedures:    Antibiotics:   Anti-infectives (From admission, onward)   Start     Dose/Rate Route Frequency Ordered Stop   06/20/19 1400  metroNIDAZOLE (FLAGYL) tablet 500 mg     Discontinue     500 mg Oral Every 8 hours 06/20/19 0928     06/14/19 1330  cefTRIAXone (ROCEPHIN) 2 g in sodium chloride 0.9 % 100 mL IVPB     Discontinue     2 g 200 mL/hr over 30 Minutes Intravenous Every 24 hours 06/14/19 1316     06/14/19 1330  metroNIDAZOLE (FLAGYL) IVPB 500 mg  Status:  Discontinued        500 mg 100 mL/hr over 60 Minutes Intravenous Every 8 hours 06/14/19 1316 06/20/19 0928   06/13/19 2200  piperacillin-tazobactam (ZOSYN) IVPB 3.375 g  Status:  Discontinued        3.375 g 12.5 mL/hr over 240 Minutes Intravenous Every 12 hours 06/13/19 1111 06/14/19 1316   06/11/19 2330  piperacillin-tazobactam (ZOSYN) IVPB 3.375 g  Status:  Discontinued        3.375 g 12.5 mL/hr over 240 Minutes Intravenous Every 8 hours 06/11/19 1852 06/13/19 1111   06/11/19 1715  piperacillin-tazobactam (ZOSYN) IVPB 3.375 g        3.375 g 100 mL/hr over 30 Minutes Intravenous  Once 06/11/19 1714 06/11/19 1942       Objective   Vitals:   06/24/19 0800 06/24/19 1219 06/24/19 1235 06/24/19 1240  BP: (!) 158/80 (!) 162/99 (!) 177/95 (!) 156/87  Pulse: 84 87 88 88  Resp: 14 20 20 19   Temp: 97.9 F (36.6 C)     TempSrc: Oral     SpO2: 94% 98% 99% 100%  Weight:      Height:        Intake/Output Summary (Last 24 hours) at 06/24/2019 1313 Last data filed at 06/24/2019 1100 Gross per 24 hour  Intake 480 ml  Output 2390 ml  Net -1910 ml    06/13  1901 - 06/15 0700 In: 530 [P.O.:530] Out: 4230 [Urine:4150; Drains:80]  Filed Weights   06/19/19 0720 06/19/19 1059 06/23/19 0359  Weight: (!) 180.1 kg (!) 176.6 kg (!) 180 kg    Physical Examination:  General-appears in no acute distress Heart-S1-S2, regular, no murmur auscultated Lungs-clear to auscultation bilaterally, no wheezing or crackles auscultated Abdomen-soft,  no organomegaly, mild tenderness in right lower quadrant Extremities-trace edema in the lower extremities Neuro-alert, oriented x3, no focal deficit noted   Data Reviewed:   Recent Results (from the past 240 hour(s))  MRSA PCR Screening     Status: None   Collection Time: 06/15/19  9:26 AM   Specimen: Nasopharyngeal  Result Value Ref Range Status   MRSA by PCR NEGATIVE NEGATIVE Final    Comment:        The GeneXpert MRSA Assay (FDA approved for NASAL specimens only), is one component of a comprehensive MRSA colonization surveillance program. It is not intended to diagnose MRSA infection nor to guide or monitor treatment for MRSA infections. Performed at [facility], [place] [address], [place], [place] [address]       [facility] If 7PM-7AM, please contact night-coverage at [email or web address],  Office  [phone]   06/24/2019, 1:13 PM  LOS: 13 days

## 2019-06-24 NOTE — Progress Notes (Signed)
   06/24/19 1315  Vitals  Temp 98.4 F (36.9 C)  Temp Source Oral  BP (!) 114/99  MAP (mmHg) 106  BP Location Right Arm  BP Method Automatic  Patient Position (if appropriate) Lying  Pulse Rate 91  Pulse Rate Source Monitor  ECG Heart Rate 87  Resp 17  Level of Consciousness  Level of Consciousness Alert  Oxygen Therapy  SpO2 92 %  O2 Device Room Air  PCA/Epidural/Spinal Assessment  Respiratory Pattern Regular;Unlabored  MEWS Score  MEWS Temp 0  MEWS Systolic 0  MEWS Pulse 0  MEWS RR 0  MEWS LOC 0  MEWS Score 0  MEWS Score Color Green   Patient back from IR, Drain replaced to 16 FR. Drain to Gravity with serosanguineous  Drainage. Dressing Clean,Dry and Intact.VSS. Lama,MD notified.

## 2019-06-24 NOTE — Procedures (Signed)
  Procedure: Exchange and upsize RLQ drain catheter under fluoro to 36F device EBL:   minimal Complications:  none immediate  See full dictation in [employer].  Dillard Cannon MD Main # [phone] Pager  [phone]

## 2019-06-24 NOTE — Progress Notes (Signed)
[facility] ROUNDING NOTE   Subjective:   This is a 59 year old female with history of diabetes hypertension and her associated vasculitis followed by rheumatologist and taking CellCept as well as methotrexate.  She has a history of interstitial lung disease anxiety depression.  She was admitted with  sigmoid diverticulitis and perforation status post IR drain placement.  Baseline renal function appears to be in the normal range with a creatinine about 1.86 in November 2019 and 0.98   06/11/2019.  She developed acute kidney injury secondary to acute tubular necrosis with hypotension Entresto infections and contrast use.  CT scan of abdomen and pelvis did not reveal any evidence of obstruction.  She developed severe azotemia and required dialysis.  She received a last dialysis treatment 06/20/2019.  We are continuing to monitor for recovery.  Her urine output has been excellent.  Urine output 2.7 L 06/23/2019  Blood pressure 158/80 pulse 85 temperature 98.9  Sodium 140 potassium 3.5 chloride 105 CO2 24 BUN 24 creatinine 2.83 glucose 218 calcium 8.1 albumin 2.4 hemoglobin 9.8  Wellbutrin 450 mg daily, Celexa 40 mg daily Lovenox 30 mg every 24 hours, insulin sliding scale, Flagyl 500 mg every 8 hours  IV Rocephin 2 g every 24 hours  Objective:  Vital signs in last 24 hours:  Temp:  [97.9 F (36.6 C)-99.5 F (37.5 C)] 98.9 F (37.2 C) (06/15 0400) Pulse Rate:  [81-89] 84 (06/15 0800) Resp:  [14-21] 14 (06/15 0800) BP: (127-153)/(61-88) 127/79 (06/15 0400) SpO2:  [91 %-97 %] 94 % (06/15 0800)  Weight change:  Filed Weights   06/19/19 0720 06/19/19 1059 06/23/19 0359  Weight: (!) 180.1 kg (!) 176.6 kg (!) 180 kg    Intake/Output: I/O last 3 completed shifts: In: 530 [P.O.:530] Out: 4230 [Urine:4150; Drains:80]   Intake/Output this shift:  No intake/output data recorded.  General:NAD, comfortable Heart:RRR, s1s2 nl, no rubs Lungs: Distant breath sound, no  wheezing Abdomen:soft, non-distended, right lower quadrant drain Extremities: No LE edema Neurology: Alert awake and following commands, no asterixis   Basic Metabolic Panel: Recent Labs  Lab 06/18/19 0446 06/19/19 0446 06/20/19 1104 06/20/19 1104 06/21/19 0406 06/21/19 0406 06/22/19 0410 06/23/19 0255 06/24/19 0404  NA 134*   < > 137  --  139  --  139 141 140  K 3.6   < > 3.3*  --  3.2*  --  3.4* 3.4* 3.5  CL 101   < > 101  --  100  --  101 104 105  CO2 19*   < > 23  --  25  --  26 23 24   GLUCOSE 109*   < > 138*  --  103*  --  113* 113* 118*  BUN 62*   < > 25*  --  27*  --  29* 27* 24*  CREATININE 7.63*   < > 4.48*  --  4.59*  --  4.43* 3.81* 2.83*  CALCIUM 7.8*   < > 8.2*   < > 8.0*   < > 8.2* 8.1* 8.1*  MG 2.1  --   --   --   --   --   --   --   --   PHOS  --   --  4.9*  --  5.9*  --  6.0* 5.2* 4.0   < > = values in this interval not displayed.    Liver Function Tests: Recent Labs  Lab 06/18/19 0446 06/18/19 0446 06/20/19 1104 06/21/19 0406 06/22/19 0410  06/23/19 0255 06/24/19 0404  AST 38  --   --   --   --   --   --   ALT 22  --   --   --   --   --   --   ALKPHOS 102  --   --   --   --   --   --   BILITOT 0.5  --   --   --   --   --   --   PROT 5.7*  --   --   --   --   --   --   ALBUMIN 2.2*   < > 2.4* 2.2* 2.2* 2.4* 2.4*   < > = values in this interval not displayed.   No results for input(s): LIPASE, AMYLASE in the last 168 hours. No results for input(s): AMMONIA in the last 168 hours.  CBC: Recent Labs  Lab 06/18/19 0446 06/19/19 0446 06/21/19 0406 06/23/19 0255  WBC 13.2* 14.3* 15.2* 14.6*  HGB 9.5* 9.7* 9.4* 9.8*  HCT 30.5* 31.8* 31.5* 31.9*  MCV 84.7 84.8 86.8 86.0  PLT 280 306 271 247    Cardiac Enzymes: No results for input(s): CKTOTAL, CKMB, CKMBINDEX, TROPONINI in the last 168 hours.  BNP: Invalid input(s): POCBNP  CBG: Recent Labs  Lab 06/22/19 2329 06/23/19 0358 06/23/19 1558 06/23/19 2355 06/24/19 0403  GLUCAP 128*  119* 98 113* 115*    Microbiology: Results for orders placed or performed during the hospital encounter of 06/11/19  SARS Coronavirus 2 by RT PCR (hospital order, performed in [facility] hospital lab) Nasopharyngeal Nasopharyngeal Swab     Status: None   Collection Time: 06/11/19  6:16 PM   Specimen: Nasopharyngeal Swab  Result Value Ref Range Status   SARS Coronavirus 2 NEGATIVE NEGATIVE Final    Comment: (NOTE) SARS-CoV-2 target nucleic acids are NOT DETECTED. The SARS-CoV-2 RNA is generally detectable in upper and lower respiratory specimens during the acute phase of infection. The lowest concentration of SARS-CoV-2 viral copies this assay can detect is 250 copies / mL. A negative result does not preclude SARS-CoV-2 infection and should not be used as the sole basis for treatment or other patient management decisions.  A negative result may occur with improper specimen collection / handling, submission of specimen other than nasopharyngeal swab, presence of viral mutation(s) within the areas targeted by this assay, and inadequate number of viral copies (<250 copies / mL). A negative result must be combined with clinical observations, patient history, and epidemiological information. Fact Sheet for Patients:   StrictlyIdeas.no Fact Sheet for Healthcare Providers: BankingDealers.co.za This test is not yet approved or cleared  by the [place] FDA and has been authorized for detection and/or diagnosis of SARS-CoV-2 by FDA under an Emergency Use Authorization (EUA).  This EUA will remain in effect (meaning this test can be used) for the duration of the COVID-19 declaration under Section 564(b)(1) of the Act, 21 U.S.C. section 360bbb-3(b)(1), unless the authorization is terminated or revoked sooner. Performed at [facility], [place] [address] [place], [place] [address]   Culture, blood (Routine X 2) w Reflex to ID Panel      Status: Abnormal   Collection Time: 06/11/19  8:20 PM   Specimen: BLOOD  Result Value Ref Range Status   Specimen Description BLOOD BLOOD LEFT FOREARM  Final   Special Requests   Final    BOTTLES DRAWN AEROBIC AND ANAEROBIC Blood Culture results may not  be optimal due to an inadequate volume of blood received in culture bottles   Culture  Setup Time (A)  Final    GRAM VARIABLE ROD ANAEROBIC BOTTLE ONLY CRITICAL RESULT CALLED TO, READ BACK BY AND VERIFIED WITH: PHARMD J FRENS AT 0815 06/16/19 BY L BENFIELD    Culture (A)  Final    BACTEROIDES SPECIES BETA LACTAMASE NEGATIVE Performed at [facility], [address], [place], [place] [address]    Report Status 06/20/2019 FINAL  Final  Blood Culture ID Panel (Reflexed)     Status: None   Collection Time: 06/11/19  8:20 PM  Result Value Ref Range Status   Enterococcus species NOT DETECTED NOT DETECTED Final   Listeria monocytogenes NOT DETECTED NOT DETECTED Final   Staphylococcus species NOT DETECTED NOT DETECTED Final   Staphylococcus aureus (BCID) NOT DETECTED NOT DETECTED Final   Streptococcus species NOT DETECTED NOT DETECTED Final   Streptococcus agalactiae NOT DETECTED NOT DETECTED Final   Streptococcus pneumoniae NOT DETECTED NOT DETECTED Final   Streptococcus pyogenes NOT DETECTED NOT DETECTED Final   Acinetobacter baumannii NOT DETECTED NOT DETECTED Final   Enterobacteriaceae species NOT DETECTED NOT DETECTED Final   Enterobacter cloacae complex NOT DETECTED NOT DETECTED Final   Escherichia coli NOT DETECTED NOT DETECTED Final   Klebsiella oxytoca NOT DETECTED NOT DETECTED Final   Klebsiella pneumoniae NOT DETECTED NOT DETECTED Final   Proteus species NOT DETECTED NOT DETECTED Final   Serratia marcescens NOT DETECTED NOT DETECTED Final   Haemophilus influenzae NOT DETECTED NOT DETECTED Final   Neisseria meningitidis NOT DETECTED NOT DETECTED Final   Pseudomonas aeruginosa NOT DETECTED NOT DETECTED Final   Candida  albicans NOT DETECTED NOT DETECTED Final   Candida glabrata NOT DETECTED NOT DETECTED Final   Candida krusei NOT DETECTED NOT DETECTED Final   Candida parapsilosis NOT DETECTED NOT DETECTED Final   Candida tropicalis NOT DETECTED NOT DETECTED Final    Comment: Performed at [facility], [address], [place], [place] [address]  Culture, blood (Routine X 2) w Reflex to ID Panel     Status: None   Collection Time: 06/11/19 10:00 PM   Specimen: BLOOD  Result Value Ref Range Status   Specimen Description BLOOD RIGHT HAND  Final   Special Requests   Final    BOTTLES DRAWN AEROBIC ONLY Blood Culture results may not be optimal due to an inadequate volume of blood received in culture bottles   Culture   Final    NO GROWTH 5 DAYS Performed at [facility], [place] [address] [place], [place] [address]    Report Status 06/16/2019 FINAL  Final  MRSA PCR Screening     Status: None   Collection Time: 06/15/19  9:26 AM   Specimen: Nasopharyngeal  Result Value Ref Range Status   MRSA by PCR NEGATIVE NEGATIVE Final    Comment:        The GeneXpert MRSA Assay (FDA approved for NASAL specimens only), is one component of a comprehensive MRSA colonization surveillance program. It is not intended to diagnose MRSA infection nor to guide or monitor treatment for MRSA infections. Performed at [facility], [place] [address] [place], [place] [address]     Coagulation Studies: No results for input(s): LABPROT, INR in the last 72 hours.  Urinalysis: No results for input(s): COLORURINE, LABSPEC, PHURINE, GLUCOSEU, HGBUR, BILIRUBINUR, KETONESUR, PROTEINUR, UROBILINOGEN, NITRITE, LEUKOCYTESUR in the last 72 hours.  Invalid input(s): APPERANCEUR  Imaging: No results found.   Medications:   . cefTRIAXone (ROCEPHIN)  IV 2 g (06/23/19 1329)  . sodium chloride Stopped (06/12/19 0949)   . buPROPion  450 mg Oral Daily  . Chlorhexidine Gluconate Cloth  6 each Topical Daily  .  citalopram  40 mg Oral Daily  . enoxaparin (LOVENOX) injection  30 mg Subcutaneous Q24H  . feeding supplement (NEPRO CARB STEADY)  237 mL Oral BID BM  . insulin aspart  0-9 Units Subcutaneous Q4H  . metroNIDAZOLE  500 mg Oral Q8H  . pantoprazole  40 mg Oral Daily  . saccharomyces boulardii  250 mg Oral BID  . sodium chloride flush  10-40 mL Intracatheter Q12H  . sodium chloride flush  5 mL Intracatheter Q8H   acetaminophen **OR** acetaminophen, benzonatate, docusate sodium, gabapentin, heparin sodium (porcine), HYDROmorphone (DILAUDID) injection, labetalol, lidocaine, LORazepam, ondansetron **OR** ondansetron (ZOFRAN) IV, sodium chloride flush  Assessment/ Plan:   Acute kidney injury secondary to acute tubular necrosis hypertension Entresto used infection contrast nephropathy.  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  CT scan did not show any evidence of obstruction last dialysis treatment was 06/20/2019.  Urine output has improved I doubt she will need further dialysis.  I would recommend removal of dialysis catheter.  Renal function continues to improve.  Hypertension/volume appears to be stable.  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  Status post IR drain continue antibiotics PT mobilization.  Appreciate the assistance of Dr. Rosendo Gros   LOS: [address] @TODAY @8 :06 AM

## 2019-06-24 NOTE — Progress Notes (Signed)
Patient ID: [name], female   DOB: Aug 06, 1960, 59 y.o.   MRN: [id number]         [facility] for Infectious Disease  Date of Admission:  06/11/2019   Total days of antibiotics 14          ASSESSMENT: [name] is a 59 y.o. immunosuppressed female with diverticular abscess. She is currently undergoing drain upsize in IR. Afebrile, persistent leukocytosis 14.6K but improving; likely due to inadequate drainage. Would ceftriaxone + metronidazole. Duration difficult to determine at this time and will be dependent on follow up CT scans to re-evaluate abscess/source.   Bacteroides species (beta lactamase -) growing in blood; no cultures sent from abscess at the time of drain placement unfortunately. Covered with metronidazole     PLAN: Continue ceftriaxone and metronidazole Follow up drain output with new larger drain Follow up CTs as directed by surgery / IR team to determine length of therapy   Principal Problem:   Acute diverticulitis Active Problems:   Obesity (BMI 30-39.9)   ILD (interstitial lung disease) ([place])   Hypertension   Acute kidney injury (AKI) with acute tubular necrosis (ATN) (HCC)   Pressure injury of skin   Colonic diverticular abscess   Scheduled Meds:  buPROPion  450 mg Oral Daily   Chlorhexidine Gluconate Cloth  6 each Topical Daily   citalopram  40 mg Oral Daily   enoxaparin (LOVENOX) injection  40 mg Subcutaneous Q24H   feeding supplement (NEPRO CARB STEADY)  237 mL Oral BID BM   fentaNYL       insulin aspart  0-9 Units Subcutaneous Q4H   lidocaine       metroNIDAZOLE  500 mg Oral Q8H   midazolam       pantoprazole  40 mg Oral Daily   saccharomyces boulardii  250 mg Oral BID   sodium chloride flush  10-40 mL Intracatheter Q12H   sodium chloride flush  5 mL Intracatheter Q8H   Continuous Infusions:  cefTRIAXone (ROCEPHIN)  IV 2 g (06/23/19 1329)   sodium chloride Stopped (06/12/19 0949)   PRN Meds:.acetaminophen **OR**  acetaminophen, benzonatate, docusate sodium, fentaNYL, gabapentin, HYDROmorphone (DILAUDID) injection, labetalol, LORazepam, midazolam, ondansetron **OR** ondansetron (ZOFRAN) IV, sodium chloride flush   SUBJECTIVE: Frustrated with NPO state with delay in getting IR to upsize drain from this weekend -- she is on the way out now to have this done.  60 cc drainage noted overnight.  Afebrile.  Improved but not resolved abdominal pain compared to admission.   Nephrology following - HD catheter removed.    Review of Systems: Review of Systems  Constitutional: Negative for chills, diaphoresis and fever.  Gastrointestinal: Positive for abdominal pain. Negative for nausea and vomiting.    Allergies  Allergen Reactions   Guaifenesin Nausea And Vomiting and Other (See Comments)    un   Mucinex [Guaifenesin Er] Nausea And Vomiting   Sulfa Antibiotics Nausea And Vomiting    OBJECTIVE: Vitals:   06/24/19 0400 06/24/19 0800 06/24/19 1219 06/24/19 1235  BP: 127/79 (!) 158/80 (!) 162/99 (!) 177/95  Pulse: 86 84 87 88  Resp: 16 14 20 20   Temp: 98.9 F (37.2 C) 97.9 F (36.6 C)    TempSrc: Oral Oral    SpO2: 92% 94% 98% 99%  Weight:      Height:       Body mass index is 60.34 kg/m.  Physical Exam Constitutional:      Comments: Alert, resting in her bed comfortably  HENT:     Head: Normocephalic.  Eyes:     Pupils: Pupils are equal, round, and reactive to light.  Cardiovascular:     Rate and Rhythm: Normal rate and regular rhythm.     Heart sounds: No murmur heard.   Pulmonary:     Effort: Pulmonary effort is normal.     Breath sounds: Normal breath sounds.  Abdominal:     Tenderness: There is abdominal tenderness.     Lab Results Lab Results  Component Value Date   WBC 14.6 (H) 06/23/2019   HGB 9.8 (L) 06/23/2019   HCT 31.9 (L) 06/23/2019   MCV 86.0 06/23/2019   PLT 247 06/23/2019    Lab Results  Component Value Date   CREATININE 2.83 (H) 06/24/2019   BUN 24  (H) 06/24/2019   NA 140 06/24/2019   K 3.5 06/24/2019   CL 105 06/24/2019   CO2 24 06/24/2019    Lab Results  Component Value Date   ALT 22 06/18/2019   AST 38 06/18/2019   ALKPHOS 102 06/18/2019   BILITOT 0.5 06/18/2019     Microbiology: Recent Results (from the past 240 hour(s))  MRSA PCR Screening     Status: None   Collection Time: 06/15/19  9:26 AM   Specimen: Nasopharyngeal  Result Value Ref Range Status   MRSA by PCR NEGATIVE NEGATIVE Final    Comment:        The GeneXpert MRSA Assay (FDA approved for NASAL specimens only), is one component of a comprehensive MRSA colonization surveillance program. It is not intended to diagnose MRSA infection nor to guide or monitor treatment for MRSA infections. Performed at [facility], [place] [address] [place], [place] [address]     [name], [place] [facility] [phone] pager   [phone] cell 06/24/2019, 12:38 PM

## 2019-06-24 NOTE — Progress Notes (Signed)
Patient off unit, went to IR for drain resize.

## 2019-06-24 NOTE — Progress Notes (Signed)
Subjective/Chief Complaint: Unchanged, having bowel function, npo still for drain   Objective: Vital signs in last 24 hours: Temp:  [97.9 F (36.6 C)-99.5 F (37.5 C)] 97.9 F (36.6 C) (06/15 0800) Pulse Rate:  [81-89] 84 (06/15 0800) Resp:  [14-21] 14 (06/15 0800) BP: (127-158)/(61-88) 158/80 (06/15 0800) SpO2:  [91 %-97 %] 94 % (06/15 0800) Last BM Date: 06/23/19  Intake/Output from previous day: 06/14 0701 - 06/15 0700 In: 530 [P.O.:530] Out: 2860 [Urine:2800; Drains:60] Intake/Output this shift: Total I/O In: -  Out: 250 [Urine:250]  GI: soft nt/nd drain in place with some purulent output  Lab Results:  Recent Labs    06/23/19 0255  WBC 14.6*  HGB 9.8*  HCT 31.9*  PLT 247   BMET Recent Labs    06/23/19 0255 06/24/19 0404  NA 141 140  K 3.4* 3.5  CL 104 105  CO2 23 24  GLUCOSE 113* 118*  BUN 27* 24*  CREATININE 3.81* 2.83*  CALCIUM 8.1* 8.1*   PT/INR No results for input(s): LABPROT, INR in the last 72 hours. ABG No results for input(s): PHART, HCO3 in the last 72 hours.  Invalid input(s): PCO2, PO2  Studies/Results: No results found.  Anti-infectives: Anti-infectives (From admission, onward)   Start     Dose/Rate Route Frequency Ordered Stop   06/20/19 1400  metroNIDAZOLE (FLAGYL) tablet 500 mg     Discontinue     500 mg Oral Every 8 hours 06/20/19 0928     06/14/19 1330  cefTRIAXone (ROCEPHIN) 2 g in sodium chloride 0.9 % 100 mL IVPB     Discontinue     2 g 200 mL/hr over 30 Minutes Intravenous Every 24 hours 06/14/19 1316     06/14/19 1330  metroNIDAZOLE (FLAGYL) IVPB 500 mg  Status:  Discontinued        500 mg 100 mL/hr over 60 Minutes Intravenous Every 8 hours 06/14/19 1316 06/20/19 0928   06/13/19 2200  piperacillin-tazobactam (ZOSYN) IVPB 3.375 g  Status:  Discontinued        3.375 g 12.5 mL/hr over 240 Minutes Intravenous Every 12 hours 06/13/19 1111 06/14/19 1316   06/11/19 2330  piperacillin-tazobactam (ZOSYN) IVPB 3.375 g   Status:  Discontinued        3.375 g 12.5 mL/hr over 240 Minutes Intravenous Every 8 hours 06/11/19 1852 06/13/19 1111   06/11/19 1715  piperacillin-tazobactam (ZOSYN) IVPB 3.375 g        3.375 g 100 mL/hr over 30 Minutes Intravenous  Once 06/11/19 1714 06/11/19 1942      Assessment/Plan: 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 Currently undergoing HD.Nephrology following Urinary Retention  Malnutrition - prealbumin 6.7  Acute sigmoid diverticulitis with perforationand abscess - 2nd bout,history of diverticulitis in 2014 - last colonoscopy >10 years ago - CT scan6/3showedprogression of acute sigmoid diverticulitis now with evidence of perforation, increasing fluid in the right lower quadrant with gas fluid level, no rim enhancement to suggest organized fluid collection or abscess at this time - S/p IR drain on 6/4, we asked IR to upsize drain and reevaluate on Saturday.  She has still not had any procedure and has been npo due to this which is less than ideal. - Continue abx. Per ID (switched to rocephin/flagyl due to worsening renal failure) - Continue PT, mobilize.  ID- zosyn 6/2-6/5, rocephin/flagyl 6/5>> FEN -IVF,softonce her procedure is done VTE -SCDs,  lovenox Foley -in place Follow up -TBD  Plan:Cont' abx, IR today hopefully. Ok to resume soft diet after IR drain later today.  LOS: 12 days   Rolm Bookbinder 06/24/2019

## 2019-06-24 NOTE — Plan of Care (Signed)
Patient A&O x4. VSS. Free from falls. Pt turns self in bed.Pt tolerating fluids and meals.Pt voiding adequately during shift. RLQ drain in place to gravity, serosang drainage.Q8 flush done per orders with 13ml of NS. DVT prophylactic: lovenox and ted hose. Pain managed. BG managed. Bed wheels locked. Phone and call bell within reach. Pt is resting, no distress. POC reviewed.   Problem: Education: Goal: Knowledge of General Education information will improve Description: Including pain rating scale, medication(s)/side effects and non-pharmacologic comfort measures Outcome: Progressing   Problem: Health Behavior/Discharge Planning: Goal: Ability to manage health-related needs will improve Outcome: Progressing   Problem: Clinical Measurements: Goal: Ability to maintain clinical measurements within normal limits will improve Outcome: Progressing Goal: Will remain free from infection Outcome: Progressing Goal: Diagnostic test results will improve Outcome: Progressing Goal: Respiratory complications will improve Outcome: Progressing Goal: Cardiovascular complication will be avoided Outcome: Progressing   Problem: Activity: Goal: Risk for activity intolerance will decrease Outcome: Progressing   Problem: Coping: Goal: Level of anxiety will decrease Outcome: Progressing   Problem: Elimination: Goal: Will not experience complications related to bowel motility Outcome: Progressing Goal: Will not experience complications related to urinary retention Outcome: Progressing   Problem: Pain Managment: Goal: General experience of comfort will improve Outcome: Progressing

## 2019-06-24 NOTE — Progress Notes (Signed)
PT Cancellation Note  Patient Details Name: [name] MRN: [id number] DOB: 13-Nov-1960   Cancelled Treatment:    Reason Eval/Treat Not Completed: Patient at procedure or test/unavailable.  IR came to take pt to a procedure as this therapist was starting a session.  May not make it back, but will attempt as able. 06/24/2019  Ginger Carne., PT Acute Rehabilitation Services [phone]  (pager) [phone]  (office)   [name] [name] 06/24/2019, 1:55 PM

## 2019-06-24 NOTE — Plan of Care (Signed)
  Problem: Health Behavior/Discharge Planning: Goal: Ability to manage health-related needs will improve Outcome: Progressing   Problem: Clinical Measurements: Goal: Will remain free from infection Outcome: Progressing Goal: Respiratory complications will improve Outcome: Progressing   Problem: Coping: Goal: Level of anxiety will decrease Outcome: Progressing   Problem: Elimination: Goal: Will not experience complications related to urinary retention Outcome: Progressing   Problem: Pain Managment: Goal: General experience of comfort will improve Outcome: Progressing

## 2019-06-25 DIAGNOSIS — R7881 Bacteremia: Secondary | ICD-10-CM

## 2019-06-25 DIAGNOSIS — B372 Candidiasis of skin and nail: Secondary | ICD-10-CM

## 2019-06-25 DIAGNOSIS — K5720 Diverticulitis of large intestine with perforation and abscess without bleeding: Secondary | ICD-10-CM

## 2019-06-25 LAB — BASIC METABOLIC PANEL
Anion gap: 10 (ref 5–15)
BUN: 18 mg/dL (ref 6–20)
CO2: 26 mmol/L (ref 22–32)
Calcium: 8 mg/dL — ABNORMAL LOW (ref 8.9–10.3)
Chloride: 104 mmol/L (ref 98–111)
Creatinine, Ser: 1.99 mg/dL — ABNORMAL HIGH (ref 0.44–1.00)
GFR calc Af Amer: 31 mL/min — ABNORMAL LOW (ref >60)
GFR calc non Af Amer: 27 mL/min — ABNORMAL LOW (ref >60)
Glucose, Bld: 103 mg/dL — ABNORMAL HIGH (ref 70–99)
Potassium: 3.5 mmol/L (ref 3.5–5.1)
Sodium: 140 mmol/L (ref 135–145)

## 2019-06-25 LAB — GLUCOSE, CAPILLARY
Glucose-Capillary: 97 mg/dL (ref 70–99)
Glucose-Capillary: 148 mg/dL — ABNORMAL HIGH (ref 70–99)

## 2019-06-25 MED ORDER — NYSTATIN 100000 UNIT/GM EX POWD
Freq: Two times a day (BID) | CUTANEOUS | Status: DC
  Administered 2019-06-29: 1 via TOPICAL
  Filled 2019-06-25: qty 15

## 2019-06-25 MED ORDER — ENOXAPARIN SODIUM 100 MG/ML ~~LOC~~ SOLN
90.0000 mg | SUBCUTANEOUS | Status: DC
  Administered 2019-06-25 – 2019-06-29 (×5): 90 mg via SUBCUTANEOUS
  Filled 2019-06-25 (×5): qty 1

## 2019-06-25 NOTE — Progress Notes (Signed)
Skin audit nurses (Alphonsus Sias, RN and Markus Jarvis, RN) assessed patient this AM. There are not pressure ulcer on patient left buttocks as documented. Patient had MASD and two open, dry blisters.

## 2019-06-25 NOTE — Progress Notes (Signed)
Patient ID: [name], female   DOB: [birth year], 59 y.o.   MRN: [id number]         [facility] for Infectious Disease  Date of Admission:  06/11/2019   Total days of antibiotics 15           ASSESSMENT: [name] is a 59 y.o. immunosuppressed female with diverticular abscess and bacteroides bacteremia. Original drain placed 6/4>> up-sized 6/15. Not much documented output overnight, but unclear how much was removed yesterday in IR. She states the plan is going to be discharge home with drain in place and follow up CT scans but unclear as to when this will happen. Unfortunately no cultures sent from abscess at the time of drain placement.  Duration of her treatment will be determined by adequacy of drainage. We discussed placement of PICC line and home administration of IV antibiotic. She is a bit hesitant for this but does think she can accommodate.  She and her family are discussing where she should be discharged home to - I recommend that she be with someone 24/7 to ensure she has support for ADLs and to help accommodate medical equipment. It sounds like her husband is only home partially through the week due to work.   Bacteroides bacteremia (beta lactamase -). Covered with current regimen.   She has a fungal appearing rash involving skin folds under panus and lateral torso - will order nystatin topical powder to help treat, but she may ultimately require systemic antifungal for brief period of time given involvement. I do think she may also benefit from InterDry Ag sheets for the right lower panus.     PLAN: 1. Continue ceftriaxone and metronidazole 2. Follow up drain output with new larger drain 3. Follow up CTs as directed by surgery / IR team to determine length of therapy 4. Will need PICC (tunneled vs conventional per Renal's discretion) before D/C 5. Nystatin powder topical to rash 6. Consider InterDry sheets to wick moisture between folds    Principal  Problem:   Colonic diverticular abscess Active Problems:   Acute diverticulitis   Bacteremia due to bacteroides species   Obesity (BMI 30-39.9)   ILD (interstitial lung disease) ([place])   Hypertension   Acute kidney injury (AKI) with acute tubular necrosis (ATN) (HCC)   Pressure injury of skin   Candidal intertrigo   Scheduled Meds: . buPROPion  450 mg Oral Daily  . carvedilol  12.5 mg Oral BID WC  . Chlorhexidine Gluconate Cloth  6 each Topical Daily  . citalopram  40 mg Oral Daily  . enoxaparin (LOVENOX) injection  40 mg Subcutaneous Q24H  . feeding supplement (NEPRO CARB STEADY)  237 mL Oral BID BM  . insulin aspart  0-9 Units Subcutaneous Q4H  . metroNIDAZOLE  500 mg Oral Q8H  . nystatin   Topical BID  . pantoprazole  40 mg Oral Daily  . saccharomyces boulardii  250 mg Oral BID  . sodium chloride flush  10-40 mL Intracatheter Q12H  . sodium chloride flush  5 mL Intracatheter Q8H   Continuous Infusions: . cefTRIAXone (ROCEPHIN)  IV 2 g (06/24/19 1442)  . sodium chloride Stopped (06/12/19 0949)   PRN Meds:.acetaminophen **OR** acetaminophen, benzonatate, docusate sodium, gabapentin, HYDROmorphone (DILAUDID) injection, labetalol, LORazepam, ondansetron **OR** ondansetron (ZOFRAN) IV, sodium chloride flush   SUBJECTIVE: Left sided abdominal pain is gone. Right sided persists but not as severe. Still very swollen to the right lower abdominal wall.   No diarrhea, +flatus and  BMs, tolerating soft diet.   Low grade temp last night 100.2   New red rash between many folds of skin, worse on the right flank/panus. No significant itching/burning but her her sister noticed this was present earlier.    Review of Systems: Review of Systems  Constitutional: Negative for chills, diaphoresis and fever.  Respiratory: Negative for cough.   Cardiovascular: Negative for chest pain.  Gastrointestinal: Positive for abdominal pain. Negative for nausea and vomiting.  Genitourinary: Negative  for dysuria.  Musculoskeletal: Negative for back pain and myalgias.  Skin: Positive for rash.  Neurological: Positive for weakness. Negative for dizziness.     Allergies  Allergen Reactions  . Guaifenesin Nausea And Vomiting and Other (See Comments)    un  . Mucinex [Guaifenesin Er] Nausea And Vomiting  . Sulfa Antibiotics Nausea And Vomiting    OBJECTIVE: Vitals:   06/25/19 0030 06/25/19 0400 06/25/19 0752 06/25/19 0853  BP:  (!) 141/79 (!) 149/84 (!) 122/98  Pulse:  80 79 84  Resp:  16 15   Temp: 99.1 F (37.3 C) 98.8 F (37.1 C) 98.4 F (36.9 C)   TempSrc: Oral Oral Oral   SpO2:  93% 94%   Weight:      Height:       Body mass index is 60.34 kg/m.  Physical Exam Vitals and nursing note reviewed.  Constitutional:      Comments: Resting in bed. Mother and sister at the bedside. They are all arguing.   HENT:     Head: Normocephalic.  Eyes:     General: No scleral icterus.    Pupils: Pupils are equal, round, and reactive to light.  Cardiovascular:     Rate and Rhythm: Normal rate and regular rhythm.     Heart sounds: No murmur heard.   Pulmonary:     Effort: Pulmonary effort is normal.     Breath sounds: Normal breath sounds.  Abdominal:     Tenderness: There is abdominal tenderness.     Comments: Skin is firm overlying right flank and lower panus.   Skin:    General: Skin is warm.     Capillary Refill: Capillary refill takes less than 2 seconds.     Findings: Rash present.          Comments: Rash patterned to involve skin folds. Bright erythematous base with some satellite papules. No dranage.   Neurological:     Mental Status: She is oriented to person, place, and time.     Lab Results Lab Results  Component Value Date   WBC 14.6 (H) 06/23/2019   HGB 9.8 (L) 06/23/2019   HCT 31.9 (L) 06/23/2019   MCV 86.0 06/23/2019   PLT 247 06/23/2019    Lab Results  Component Value Date   CREATININE 1.99 (H) 06/25/2019   BUN 18 06/25/2019   NA 140  06/25/2019   K 3.5 06/25/2019   CL 104 06/25/2019   CO2 26 06/25/2019    Lab Results  Component Value Date   ALT 22 06/18/2019   AST 38 06/18/2019   ALKPHOS 102 06/18/2019   BILITOT 0.5 06/18/2019     Microbiology: No results found for this or any previous visit (from the past 240 hour(s)).  [name], [place] [facility] [phone] pager   [phone] cell 06/25/2019, 11:53 AM

## 2019-06-25 NOTE — Progress Notes (Signed)
[facility] PROGRESS NOTE    Progress Note  [name]  DUK:[id number] DOB: 09/05/1960 DOA: 06/11/2019 PCP: Christain Sacramento, MD     Brief Narrative:   [name] is an 59 y.o. female past medical history of prior sigmoid diverticulitis with perforation in 2014 history of ILD diabetes mellitus type 2 comes into the hospital for abdominal pain in the ED CT scan showed sigmoid diverticulitis she was started empirically on IV Zosyn, repeated CT showed drain in place with a fluid collection measuring 12 x 8 x 11 which is increased in size compared to previous.  General surgery on board and appreciate assistance, IR had to be reconsulted.  Assessment/Plan:   Sepsis due to acute diverticulitis As the patient had worsening symptoms a CT scan was repeated that showed possible perforation without abscess collection General surgery was consulted who recommended IR for drain placement.,  She was initially on IV Zosyn which was later transitioned to Rocephin and Flagyl ID was consulted. Repeated CT scan was ordered that showed large collection which is worse compared to previous imaging. IR was reconsulted and drain 16 [place] placement was done on 06/14/2019. Blood cultures have been growing gram variable rods Appreciate IDs, general surgery and interventional radiology assistance. Continue to work with physical therapy mobilize the patient.  She is having regular bowel movements, she relates they were watery but now they are more formed.  Orthostatic hypotension: Per physical therapy, she is currently not on any antihypertensive medication. Apparently due to deconditioning, use TED hose need to continue to work with physical therapy.  Acute kidney injury leading to ATN: In the setting of hypotension, contrast-induced nephropathy and Entresto. Nephrology was consulted he was started on hemodialysis due to uremic symptoms, his creatinine is slowly improving.  Urine output has significantly  improved. Her creatinine continues to improve drastically.  Appreciate renal's assistance.  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  Controlled diabetes mellitus type 2: With a hemoglobin A1c of 7.0.  Four-point glycemic agents were held on admission continue long-acting insulin plus sliding scale.  Hypokalemia: Repleted orally now improved at 3.5.  Essential hypertension Entresto was stopped on admission as her blood pressure started to improve and became elevated she was restarted back on her Coreg.    DVT prophylaxis: lovenox Family Communication:none Status is: Inpatient  Remains inpatient appropriate because:Hemodynamically unstable   Dispo: The patient is from: Home              Anticipated d/c is to: Home              Anticipated d/c date is: > 3 days              Patient currently is not medically stable to d/c.  Code Status:     Code Status Orders  (From admission, onward)         Start     Ordered   06/11/19 1845  Full code  Continuous        06/11/19 1845        Code Status History    Date Active Date Inactive Code Status Order ID Comments User Context   06/03/2019 0624 06/03/2019 1507 Full Code [id number]  Everlene Farrier, MD Inpatient   06/23/2013 1958 07/22/2013 1426 Full Code [id number]  Caren Griffins, MD ED   09/23/2012 1836 10/05/2012 1516 Full Code [id number]  Jackolyn Confer, MD  ED   Advance Care Planning Activity        IV Access:    Peripheral IV   Procedures and diagnostic studies:   IR Catheter Tube Change  Result Date: 06/24/2019 INDICATION: Presumed diverticular abscess, status post 12 French pigtail drain catheter placement 06/13/2019. Follow-up CT shows continued enlargement of the collection. Drain revision is requested. EXAM: EXCHANGE/UPSIZING PELVIC DRAIN CATHETER UNDER FLUOROSCOPIC GUIDANCE MEDICATIONS: The patient is currently  admitted to the hospital and receiving intravenous antibiotics. The antibiotics were administered within an appropriate time frame prior to the initiation of the procedure. ANESTHESIA/SEDATION: Intravenous Fentanyl 16mcg and Versed 1mg  were administered as conscious sedation during continuous monitoring of the patient's level of consciousness and physiological / cardiorespiratory status by the radiology RN, with a total moderate sedation time of 10 minutes. COMPLICATIONS: None immediate. PROCEDURE: Informed written consent was obtained from the patient after a thorough discussion of the procedural risks, benefits and alternatives. All questions were addressed. Maximal Sterile Barrier Technique was utilized including caps, mask, sterile gowns, sterile gloves, sterile drape, hand hygiene and skin antiseptic. A timeout was performed prior to the initiation of the procedure. Skin surrounding the previously placed drain was infiltrated with 1% lidocaine. Small contrast injection opacified the right lower quadrant collection freely. The previously placed catheter was cut and exchanged over a short Amplatz wire for a 16 French pigtail drain catheter, formed centrally within the collection. Contrast injection confirmed appropriate positioning and patency although the pigtail would not fully lock. The catheter was secured externally with 0 Prolene suture and StatLock and placed to gravity drain bag. The patient tolerated the procedure well. IMPRESSION: 1. Technically successful exchange and upsizing of right lower quadrant drain to a 16 [place] device. Electronically Signed   By: Lucrezia Europe M.D.   On: 06/24/2019 16:24     Medical Consultants:    None.  Anti-Infectives:   Rocephin and flagyl  Subjective:    [name]   Objective:    Vitals:   06/25/19 0030 06/25/19 0400 06/25/19 0752 06/25/19 0853  BP:  (!) 141/79 (!) 149/84 (!) 122/98  Pulse:  80 79 84  Resp:  16 15   Temp: 99.1 F (37.3 C)  98.8 F (37.1 C) 98.4 F (36.9 C)   TempSrc: Oral Oral Oral   SpO2:  93% 94%   Weight:      Height:       SpO2: 94 % O2 Flow Rate (L/min): 2 L/min   Intake/Output Summary (Last 24 hours) at 06/25/2019 1141 Last data filed at 06/25/2019 0600 Gross per 24 hour  Intake 1257 ml  Output 1915 ml  Net -658 ml   Filed Weights   06/19/19 0720 06/19/19 1059 06/23/19 0359  Weight: (!) 180.1 kg (!) 176.6 kg (!) 180 kg    Exam: General exam: In no acute distress, morbidly obese Respiratory system: Good air movement and clear to auscultation. Cardiovascular system: S1 & S2 heard, RRR. No JVD. Gastrointestinal system: Abdomen is nondistended, soft and nontender.  Extremities: No pedal edema. Skin: No rashes, lesions or ulcers Psychiatry: Judgement and insight appear normal.    Data Reviewed:    Labs: Basic Metabolic Panel: Recent Labs  Lab 06/20/19 1104 06/20/19 1104 06/21/19 0406 06/21/19 0406 06/22/19 0410 06/22/19 0410 06/23/19 0255 06/23/19 0255 06/24/19 0404 06/25/19 0506  NA 137   < > 139  --  139  --  141  --  140 140  K 3.3*   < > 3.2*   < >  3.4*   < > 3.4*   < > 3.5 3.5  CL 101   < > 100  --  101  --  104  --  105 104  CO2 23   < > 25  --  26  --  23  --  24 26  GLUCOSE 138*   < > 103*  --  113*  --  113*  --  118* 103*  BUN 25*   < > 27*  --  29*  --  27*  --  24* 18  CREATININE 4.48*   < > 4.59*  --  4.43*  --  3.81*  --  2.83* 1.99*  CALCIUM 8.2*   < > 8.0*  --  8.2*  --  8.1*  --  8.1* 8.0*  PHOS 4.9*  --  5.9*  --  6.0*  --  5.2*  --  4.0  --    < > = values in this interval not displayed.   GFR Estimated Creatinine Clearance: 53.7 mL/min (A) (by C-G formula based on SCr of 1.99 mg/dL (H)). Liver Function Tests: Recent Labs  Lab 06/20/19 1104 06/21/19 0406 06/22/19 0410 06/23/19 0255 06/24/19 0404  ALBUMIN 2.4* 2.2* 2.2* 2.4* 2.4*   No results for input(s): LIPASE, AMYLASE in the last 168 hours. No results for input(s): AMMONIA in the last 168  hours. Coagulation profile No results for input(s): INR, PROTIME in the last 168 hours. COVID-19 Labs  No results for input(s): DDIMER, FERRITIN, LDH, CRP in the last 72 hours.  Lab Results  Component Value Date   SARSCOV2NAA NEGATIVE 06/11/2019   [place] NEGATIVE 05/30/2019    CBC: Recent Labs  Lab 06/19/19 0446 06/21/19 0406 06/23/19 0255  WBC 14.3* 15.2* 14.6*  HGB 9.7* 9.4* 9.8*  HCT 31.8* 31.5* 31.9*  MCV 84.8 86.8 86.0  PLT 306 271 247   Cardiac Enzymes: No results for input(s): CKTOTAL, CKMB, CKMBINDEX, TROPONINI in the last 168 hours. BNP (last 3 results) No results for input(s): PROBNP in the last 8760 hours. CBG: Recent Labs  Lab 06/24/19 0403 06/24/19 0825 06/24/19 1331 06/24/19 1950 06/25/19 0412  GLUCAP 115* 104* 114* 100* 97   D-Dimer: No results for input(s): DDIMER in the last 72 hours. Hgb A1c: No results for input(s): HGBA1C in the last 72 hours. Lipid Profile: No results for input(s): CHOL, HDL, LDLCALC, TRIG, CHOLHDL, LDLDIRECT in the last 72 hours. Thyroid function studies: No results for input(s): TSH, T4TOTAL, T3FREE, THYROIDAB in the last 72 hours.  Invalid input(s): FREET3 Anemia work up: No results for input(s): VITAMINB12, FOLATE, FERRITIN, TIBC, IRON, RETICCTPCT in the last 72 hours. Sepsis Labs: Recent Labs  Lab 06/19/19 0446 06/21/19 0406 06/23/19 0255  WBC 14.3* 15.2* 14.6*   Microbiology No results found for this or any previous visit (from the past 240 hour(s)).   Medications:   . buPROPion  450 mg Oral Daily  . carvedilol  12.5 mg Oral BID WC  . Chlorhexidine Gluconate Cloth  6 each Topical Daily  . citalopram  40 mg Oral Daily  . enoxaparin (LOVENOX) injection  40 mg Subcutaneous Q24H  . feeding supplement (NEPRO CARB STEADY)  237 mL Oral BID BM  . insulin aspart  0-9 Units Subcutaneous Q4H  . metroNIDAZOLE  500 mg Oral Q8H  . nystatin   Topical BID  . pantoprazole  40 mg Oral Daily  . saccharomyces  boulardii  250 mg Oral BID  . sodium chloride flush  10-40 mL Intracatheter Q12H  . sodium chloride flush  5 mL Intracatheter Q8H   Continuous Infusions: . cefTRIAXone (ROCEPHIN)  IV 2 g (06/24/19 1442)  . sodium chloride Stopped (06/12/19 0949)      LOS: 14 days   Charlynne Cousins  [facility]  06/25/2019, 11:41 AM

## 2019-06-25 NOTE — Progress Notes (Signed)
Physical Therapy Treatment Patient Details Name: [name] MRN: [id number] DOB: 13-Feb-1960 Today's Date: 06/25/2019    History of Present Illness 59 y.o. 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 She presented to the ED with complaints of marked lower quadrant pain. Pt admitted for sigmoid diverticulitis with perforation and abscess. Drain placed 6/4.    PT Comments    Pt agreed to participating and getting OOB with a little encouragement.  Pt need continuous cues for direction and focus to task at hand.  Emphasis on transitions, sit to stand, standing activity for increasing stamina and transfers.    Follow Up Recommendations  Home health PT;Supervision/Assistance - 24 hour     Equipment Recommendations  None recommended by PT    Recommendations for Other Services       Precautions / Restrictions Precautions Precautions: Fall Other Brace: R LQ drain    Mobility  Bed Mobility   Bed Mobility: Supine to Sit     Supine to sit: Mod assist     General bed mobility comments: assist to come up via L elbow.  Transfers Overall transfer level: Needs assistance Equipment used: Rolling walker (2 wheeled) Transfers: Sit to/from Stand Sit to Stand: Min assist Stand pivot transfers: Min assist;+2 safety/equipment       General transfer comment: cues for hand placement, stability assist while pt pivoting.  Ambulation/Gait             General Gait Details: pivoting over 4 feet only   Stairs             Wheelchair Mobility    Modified Rankin (Stroke Patients Only)       Balance     Sitting balance-Leahy Scale: Fair     Standing balance support: Bilateral upper extremity supported;Single extremity supported Standing balance-Leahy Scale: Poor Standing balance comment: Spent over 4 min standing at EOB for endurance and  then to get BP's.  Standing 164/93 (115), then sitting 144/75 (96)                            Cognition Arousal/Alertness: Awake/alert Behavior During Therapy: WFL for tasks assessed/performed Overall Cognitive Status: Within Functional Limits for tasks assessed                                        Exercises      General Comments        Pertinent Vitals/Pain Pain Assessment: Faces Faces Pain Scale: Hurts even more Pain Location: abdomen (R lower quadrant) Pain Descriptors / Indicators: Discomfort;Guarding Pain Intervention(s): Monitored during session;Repositioned;Limited activity within patient's tolerance    Home Living                      Prior Function            PT Goals (current goals can now be found in the care plan section) Acute Rehab PT Goals PT Goal Formulation: With patient Time For Goal Achievement: 06/30/19 Potential to Achieve Goals: Good Progress towards PT goals: Progressing toward goals    Frequency    Min 3X/week      PT Plan Current plan remains appropriate    Co-evaluation  AM-PAC PT "6 Clicks" Mobility   Outcome Measure  Help needed turning from your back to your side while in a flat bed without using bedrails?: A Little Help needed moving from lying on your back to sitting on the side of a flat bed without using bedrails?: A Lot Help needed moving to and from a bed to a chair (including a wheelchair)?: A Little Help needed standing up from a chair using your arms (e.g., wheelchair or bedside chair)?: A Little Help needed to walk in hospital room?: A Little Help needed climbing 3-5 steps with a railing? : A Lot 6 Click Score: 16    End of Session Equipment Utilized During Treatment: Oxygen Activity Tolerance: Other (comment) Patient left: in chair;with call bell/phone within reach;with chair alarm set Nurse Communication: Mobility status PT Visit Diagnosis: Other  abnormalities of gait and mobility (R26.89);Muscle weakness (generalized) (M62.81);Difficulty in walking, not elsewhere classified (R26.2)     Time: 4628-6381 PT Time Calculation (min) (ACUTE ONLY): 31 min  Charges:  $Therapeutic Activity: 23-37 mins                     06/25/2019  Ginger Carne., PT Acute Rehabilitation Services [phone]  (pager) [phone]  (office)   Tessie Fass Azalia Neuberger 06/25/2019, 6:21 PM

## 2019-06-25 NOTE — Progress Notes (Signed)
Patient ID: [name], female   DOB: [birth year], 59 y.o.   MRN: [id number]       Subjective: Feels maybe a little better.  Tolerating diet.  IR drain upsized yesterday.  ROS: See above, otherwise other systems negative  Objective: Vital signs in last 24 hours: Temp:  [98.1 F (36.7 C)-100.2 F (37.9 C)] 98.4 F (36.9 C) (06/16 0752) Pulse Rate:  [79-91] 84 (06/16 0853) Resp:  [15-21] 15 (06/16 0752) BP: (112-177)/(54-99) 122/98 (06/16 0853) SpO2:  [92 %-100 %] 94 % (06/16 0752) Last BM Date: 06/24/19  Intake/Output from previous day: 06/15 0701 - 06/16 0700 In: 1257 [P.O.:1142; I.V.:10; IV Piggyback:100] Out: 2415 [Urine:2400; Drains:15] Intake/Output this shift: No intake/output data recorded.  PE: Abd: morbidly obese, mildly tender in LLQ, drain in RLQ with tan cloudy drainage, +BS  Lab Results:  Recent Labs    06/23/19 0255  WBC 14.6*  HGB 9.8*  HCT 31.9*  PLT 247   BMET Recent Labs    06/24/19 0404 06/25/19 0506  NA 140 140  K 3.5 3.5  CL 105 104  CO2 24 26  GLUCOSE 118* 103*  BUN 24* 18  CREATININE 2.83* 1.99*  CALCIUM 8.1* 8.0*   PT/INR No results for input(s): LABPROT, INR in the last 72 hours. CMP     Component Value Date/Time   NA 140 06/25/2019 0506   NA 139 12/24/2017 1641   K 3.5 06/25/2019 0506   CL 104 06/25/2019 0506   CO2 26 06/25/2019 0506   GLUCOSE 103 (H) 06/25/2019 0506   BUN 18 06/25/2019 0506   BUN 15 12/24/2017 1641   CREATININE 1.99 (H) 06/25/2019 0506   CALCIUM 8.0 (L) 06/25/2019 0506   PROT 5.7 (L) 06/18/2019 0446   ALBUMIN 2.4 (L) 06/24/2019 0404   AST 38 06/18/2019 0446   ALT 22 06/18/2019 0446   ALKPHOS 102 06/18/2019 0446   BILITOT 0.5 06/18/2019 0446   GFRNONAA 27 (L) 06/25/2019 0506   GFRAA 31 (L) 06/25/2019 0506   Lipase     Component Value Date/Time   LIPASE 15 06/11/2019 1243       Studies/Results: IR Catheter Tube Change  Result Date: 06/24/2019 INDICATION: Presumed diverticular abscess,  status post 12 French pigtail drain catheter placement 06/13/2019. Follow-up CT shows continued enlargement of the collection. Drain revision is requested. EXAM: EXCHANGE/UPSIZING PELVIC DRAIN CATHETER UNDER FLUOROSCOPIC GUIDANCE MEDICATIONS: The patient is currently admitted to the hospital and receiving intravenous antibiotics. The antibiotics were administered within an appropriate time frame prior to the initiation of the procedure. ANESTHESIA/SEDATION: Intravenous Fentanyl 55mcg and Versed 1mg  were administered as conscious sedation during continuous monitoring of the patient's level of consciousness and physiological / cardiorespiratory status by the radiology RN, with a total moderate sedation time of 10 minutes. COMPLICATIONS: None immediate. PROCEDURE: Informed written consent was obtained from the patient after a thorough discussion of the procedural risks, benefits and alternatives. All questions were addressed. Maximal Sterile Barrier Technique was utilized including caps, mask, sterile gowns, sterile gloves, sterile drape, hand hygiene and skin antiseptic. A timeout was performed prior to the initiation of the procedure. Skin surrounding the previously placed drain was infiltrated with 1% lidocaine. Small contrast injection opacified the right lower quadrant collection freely. The previously placed catheter was cut and exchanged over a short Amplatz wire for a 16 French pigtail drain catheter, formed centrally within the collection. Contrast injection confirmed appropriate positioning and patency although the pigtail would not fully lock. The catheter was  secured externally with 0 Prolene suture and StatLock and placed to gravity drain bag. The patient tolerated the procedure well. IMPRESSION: 1. Technically successful exchange and upsizing of right lower quadrant drain to a 16 [place] device. Electronically Signed   By: Lucrezia Europe M.D.   On: 06/24/2019 16:24    Anti-infectives: Anti-infectives (From  admission, onward)   Start     Dose/Rate Route Frequency Ordered Stop   06/20/19 1400  metroNIDAZOLE (FLAGYL) tablet 500 mg     Discontinue     500 mg Oral Every 8 hours 06/20/19 0928     06/14/19 1330  cefTRIAXone (ROCEPHIN) 2 g in sodium chloride 0.9 % 100 mL IVPB     Discontinue     2 g 200 mL/hr over 30 Minutes Intravenous Every 24 hours 06/14/19 1316     06/14/19 1330  metroNIDAZOLE (FLAGYL) IVPB 500 mg  Status:  Discontinued        500 mg 100 mL/hr over 60 Minutes Intravenous Every 8 hours 06/14/19 1316 06/20/19 0928   06/13/19 2200  piperacillin-tazobactam (ZOSYN) IVPB 3.375 g  Status:  Discontinued        3.375 g 12.5 mL/hr over 240 Minutes Intravenous Every 12 hours 06/13/19 1111 06/14/19 1316   06/11/19 2330  piperacillin-tazobactam (ZOSYN) IVPB 3.375 g  Status:  Discontinued        3.375 g 12.5 mL/hr over 240 Minutes Intravenous Every 8 hours 06/11/19 1852 06/13/19 1111   06/11/19 1715  piperacillin-tazobactam (ZOSYN) IVPB 3.375 g        3.375 g 100 mL/hr over 30 Minutes Intravenous  Once 06/11/19 1714 06/11/19 1942       Assessment/Plan 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 Currently undergoing HD.Nephrology following Urinary Retention  Malnutrition - prealbumin 6.7  Acute sigmoid diverticulitis with perforationand abscess - 2nd bout,history of diverticulitis in 2014 - last colonoscopy >10 years ago - S/p IR drain on 6/4,upsized on 6/15 -recheck cbc in am - Continue abx. Per ID (switched to rocephin/flagyl due to worsening renal failure) - Continue PT, mobilize. -cont soft diet  ID- zosyn 6/2-6/5, rocephin/flagyl 6/5>> FEN -IVF,soft VTE -SCDs, lovenox Foley -in place Follow up -TBD   LOS: 14 days    Henreitta Cea , [facility] 06/25/2019, 11:23 AM Please see Amion for pager number during  day hours 7:00am-4:30pm or 7:00am -11:30am on weekends

## 2019-06-25 NOTE — Progress Notes (Signed)
[facility] ROUNDING NOTE   Subjective:   This is a 59 year old female with history of diabetes hypertension and her associated vasculitis followed by rheumatologist and taking CellCept as well as methotrexate.  She has a history of interstitial lung disease anxiety depression.  She was admitted with  sigmoid diverticulitis and perforation status post IR drain placement.  Baseline renal function appears to be in the normal range with a creatinine about 1.86 in November 2019 and 0.98   06/11/2019.  She developed acute kidney injury secondary to acute tubular necrosis with hypotension Entresto infections and contrast use.  CT scan of abdomen and pelvis did not reveal any evidence of obstruction.  She developed severe azotemia and required dialysis.  She received a last dialysis treatment 06/20/2019.  We are continuing to monitor for recovery.  Her urine output has been excellent.  Urine output 2.6 L 06/24/2019  Blood pressure 149/84 pulse 79 temperature 98.4 O2 sats 88%  Sodium 140 potassium 3.5 chloride 104 CO2 26 BUN 18 creatinine 1.99 glucose 103 calcium 8.0  Wellbutrin 450 mg daily, Celexa 40 mg daily Lovenox 30 mg every 24 hours, insulin sliding scale, Flagyl 500 mg every 8 hours  IV Rocephin 2 g every 24 hours  Objective:  Vital signs in last 24 hours:  Temp:  [98.1 F (36.7 C)-100.2 F (37.9 C)] 98.4 F (36.9 C) (06/16 0752) Pulse Rate:  [79-91] 79 (06/16 0752) Resp:  [15-21] 15 (06/16 0752) BP: (112-177)/(54-99) 149/84 (06/16 0752) SpO2:  [92 %-100 %] 94 % (06/16 0752)  Weight change:  Filed Weights   06/19/19 0720 06/19/19 1059 06/23/19 0359  Weight: (!) 180.1 kg (!) 176.6 kg (!) 180 kg    Intake/Output: I/O last 3 completed shifts: In: 4854 [P.O.:1142; I.V.:10; Other:5; IV Piggyback:100] Out: 6270 [Urine:3600; Drains:55]   Intake/Output this shift:  No intake/output data recorded.  General:NAD, comfortable Heart:RRR, s1s2 nl, no rubs Lungs: Distant  breath sound, no wheezing Abdomen:soft, non-distended, right lower quadrant drain Extremities: No LE edema Neurology: Alert awake and following commands, no asterixis   Basic Metabolic Panel: Recent Labs  Lab 06/20/19 1104 06/20/19 1104 06/21/19 0406 06/21/19 0406 06/22/19 0410 06/22/19 0410 06/23/19 0255 06/24/19 0404 06/25/19 0506  NA 137   < > 139  --  139  --  141 140 140  K 3.3*   < > 3.2*  --  3.4*  --  3.4* 3.5 3.5  CL 101   < > 100  --  101  --  104 105 104  CO2 23   < > 25  --  26  --  23 24 26   GLUCOSE 138*   < > 103*  --  113*  --  113* 118* 103*  BUN 25*   < > 27*  --  29*  --  27* 24* 18  CREATININE 4.48*   < > 4.59*  --  4.43*  --  3.81* 2.83* 1.99*  CALCIUM 8.2*   < > 8.0*   < > 8.2*   < > 8.1* 8.1* 8.0*  PHOS 4.9*  --  5.9*  --  6.0*  --  5.2* 4.0  --    < > = values in this interval not displayed.    Liver Function Tests: Recent Labs  Lab 06/20/19 1104 06/21/19 0406 06/22/19 0410 06/23/19 0255 06/24/19 0404  ALBUMIN 2.4* 2.2* 2.2* 2.4* 2.4*   No results for input(s): LIPASE, AMYLASE in the last 168 hours. No results for input(s):  AMMONIA in the last 168 hours.  CBC: Recent Labs  Lab 06/19/19 0446 06/21/19 0406 06/23/19 0255  WBC 14.3* 15.2* 14.6*  HGB 9.7* 9.4* 9.8*  HCT 31.8* 31.5* 31.9*  MCV 84.8 86.8 86.0  PLT 306 271 247    Cardiac Enzymes: No results for input(s): CKTOTAL, CKMB, CKMBINDEX, TROPONINI in the last 168 hours.  BNP: Invalid input(s): POCBNP  CBG: Recent Labs  Lab 06/24/19 0403 06/24/19 0825 06/24/19 1331 06/24/19 1950 06/25/19 0412  GLUCAP 115* 104* 114* 100* 49    Microbiology: Results for orders placed or performed during the hospital encounter of 06/11/19  SARS Coronavirus 2 by RT PCR (hospital order, performed in [facility] hospital lab) Nasopharyngeal Nasopharyngeal Swab     Status: None   Collection Time: 06/11/19  6:16 PM   Specimen: Nasopharyngeal Swab  Result Value Ref Range Status   SARS  Coronavirus 2 NEGATIVE NEGATIVE Final    Comment: (NOTE) SARS-CoV-2 target nucleic acids are NOT DETECTED. The SARS-CoV-2 RNA is generally detectable in upper and lower respiratory specimens during the acute phase of infection. The lowest concentration of SARS-CoV-2 viral copies this assay can detect is 250 copies / mL. A negative result does not preclude SARS-CoV-2 infection and should not be used as the sole basis for treatment or other patient management decisions.  A negative result may occur with improper specimen collection / handling, submission of specimen other than nasopharyngeal swab, presence of viral mutation(s) within the areas targeted by this assay, and inadequate number of viral copies (<250 copies / mL). A negative result must be combined with clinical observations, patient history, and epidemiological information. Fact Sheet for Patients:   StrictlyIdeas.no Fact Sheet for Healthcare Providers: BankingDealers.co.za This test is not yet approved or cleared  by the [place] FDA and has been authorized for detection and/or diagnosis of SARS-CoV-2 by FDA under an Emergency Use Authorization (EUA).  This EUA will remain in effect (meaning this test can be used) for the duration of the COVID-19 declaration under Section 564(b)(1) of the Act, 21 U.S.C. section 360bbb-3(b)(1), unless the authorization is terminated or revoked sooner. Performed at [facility], [place] [address] [place], [place] [address]   Culture, blood (Routine X 2) w Reflex to ID Panel     Status: Abnormal   Collection Time: 06/11/19  8:20 PM   Specimen: BLOOD  Result Value Ref Range Status   Specimen Description BLOOD BLOOD LEFT FOREARM  Final   Special Requests   Final    BOTTLES DRAWN AEROBIC AND ANAEROBIC Blood Culture results may not be optimal due to an inadequate volume of blood received in culture bottles   Culture  Setup Time (A)  Final     GRAM VARIABLE ROD ANAEROBIC BOTTLE ONLY CRITICAL RESULT CALLED TO, READ BACK BY AND VERIFIED WITH: PHARMD J FRENS AT 0815 06/16/19 BY L BENFIELD    Culture (A)  Final    BACTEROIDES SPECIES BETA LACTAMASE NEGATIVE Performed at [facility], [place] [address] [place], [place] [address]    Report Status 06/20/2019 FINAL  Final  Blood Culture ID Panel (Reflexed)     Status: None   Collection Time: 06/11/19  8:20 PM  Result Value Ref Range Status   Enterococcus species NOT DETECTED NOT DETECTED Final   Listeria monocytogenes NOT DETECTED NOT DETECTED Final   Staphylococcus species NOT DETECTED NOT DETECTED Final   Staphylococcus aureus (BCID) NOT DETECTED NOT DETECTED Final   Streptococcus species NOT DETECTED  NOT DETECTED Final   Streptococcus agalactiae NOT DETECTED NOT DETECTED Final   Streptococcus pneumoniae NOT DETECTED NOT DETECTED Final   Streptococcus pyogenes NOT DETECTED NOT DETECTED Final   Acinetobacter baumannii NOT DETECTED NOT DETECTED Final   Enterobacteriaceae species NOT DETECTED NOT DETECTED Final   Enterobacter cloacae complex NOT DETECTED NOT DETECTED Final   Escherichia coli NOT DETECTED NOT DETECTED Final   Klebsiella oxytoca NOT DETECTED NOT DETECTED Final   Klebsiella pneumoniae NOT DETECTED NOT DETECTED Final   Proteus species NOT DETECTED NOT DETECTED Final   Serratia marcescens NOT DETECTED NOT DETECTED Final   Haemophilus influenzae NOT DETECTED NOT DETECTED Final   Neisseria meningitidis NOT DETECTED NOT DETECTED Final   Pseudomonas aeruginosa NOT DETECTED NOT DETECTED Final   Candida albicans NOT DETECTED NOT DETECTED Final   Candida glabrata NOT DETECTED NOT DETECTED Final   Candida krusei NOT DETECTED NOT DETECTED Final   Candida parapsilosis NOT DETECTED NOT DETECTED Final   Candida tropicalis NOT DETECTED NOT DETECTED Final    Comment: Performed at [facility], [place] [address], [place], [place] [address]  Culture, blood (Routine X 2) w  Reflex to ID Panel     Status: None   Collection Time: 06/11/19 10:00 PM   Specimen: BLOOD  Result Value Ref Range Status   Specimen Description BLOOD RIGHT HAND  Final   Special Requests   Final    BOTTLES DRAWN AEROBIC ONLY Blood Culture results may not be optimal due to an inadequate volume of blood received in culture bottles   Culture   Final    NO GROWTH 5 DAYS Performed at [facility], [place] [address] [place], [place] [address]    Report Status 06/16/2019 FINAL  Final  MRSA PCR Screening     Status: None   Collection Time: 06/15/19  9:26 AM   Specimen: Nasopharyngeal  Result Value Ref Range Status   MRSA by PCR NEGATIVE NEGATIVE Final    Comment:        The GeneXpert MRSA Assay (FDA approved for NASAL specimens only), is one component of a comprehensive MRSA colonization surveillance program. It is not intended to diagnose MRSA infection nor to guide or monitor treatment for MRSA infections. Performed at [facility], [place] [address] [place],  [address]     Coagulation Studies: No results for input(s): LABPROT, INR in the last 72 hours.  Urinalysis: No results for input(s): COLORURINE, LABSPEC, PHURINE, GLUCOSEU, HGBUR, BILIRUBINUR, KETONESUR, PROTEINUR, UROBILINOGEN, NITRITE, LEUKOCYTESUR in the last 72 hours.  Invalid input(s): APPERANCEUR    Imaging: IR Catheter Tube Change  Result Date: 06/24/2019 INDICATION: Presumed diverticular abscess, status post 12 French pigtail drain catheter placement 06/13/2019. Follow-up CT shows continued enlargement of the collection. Drain revision is requested. EXAM: EXCHANGE/UPSIZING PELVIC DRAIN CATHETER UNDER FLUOROSCOPIC GUIDANCE MEDICATIONS: The patient is currently admitted to the hospital and receiving intravenous antibiotics. The antibiotics were administered within an appropriate time frame prior to the initiation of the procedure. ANESTHESIA/SEDATION: Intravenous Fentanyl 46mcg and Versed 1mg  were  administered as conscious sedation during continuous monitoring of the patient's level of consciousness and physiological / cardiorespiratory status by the radiology RN, with a total moderate sedation time of 10 minutes. COMPLICATIONS: None immediate. PROCEDURE: Informed written consent was obtained from the patient after a thorough discussion of the procedural risks, benefits and alternatives. All questions were addressed. Maximal Sterile Barrier Technique was utilized including caps, mask, sterile gowns, sterile gloves, sterile drape, hand  hygiene and skin antiseptic. A timeout was performed prior to the initiation of the procedure. Skin surrounding the previously placed drain was infiltrated with 1% lidocaine. Small contrast injection opacified the right lower quadrant collection freely. The previously placed catheter was cut and exchanged over a short Amplatz wire for a 16 French pigtail drain catheter, formed centrally within the collection. Contrast injection confirmed appropriate positioning and patency although the pigtail would not fully lock. The catheter was secured externally with 0 Prolene suture and StatLock and placed to gravity drain bag. The patient tolerated the procedure well. IMPRESSION: 1. Technically successful exchange and upsizing of right lower quadrant drain to a 16 [place] device. Electronically Signed   By: Lucrezia Europe M.D.   On: 06/24/2019 16:24     Medications:   . cefTRIAXone (ROCEPHIN)  IV 2 g (06/24/19 1442)  . sodium chloride Stopped (06/12/19 0949)   . buPROPion  450 mg Oral Daily  . carvedilol  12.5 mg Oral BID WC  . Chlorhexidine Gluconate Cloth  6 each Topical Daily  . citalopram  40 mg Oral Daily  . enoxaparin (LOVENOX) injection  40 mg Subcutaneous Q24H  . feeding supplement (NEPRO CARB STEADY)  237 mL Oral BID BM  . insulin aspart  0-9 Units Subcutaneous Q4H  . metroNIDAZOLE  500 mg Oral Q8H  . pantoprazole  40 mg Oral Daily  . saccharomyces boulardii  250 mg  Oral BID  . sodium chloride flush  10-40 mL Intracatheter Q12H  . sodium chloride flush  5 mL Intracatheter Q8H   acetaminophen **OR** acetaminophen, benzonatate, docusate sodium, gabapentin, HYDROmorphone (DILAUDID) injection, labetalol, LORazepam, ondansetron **OR** ondansetron (ZOFRAN) IV, sodium chloride flush  Assessment/ Plan:   Acute kidney injury secondary to acute tubular necrosis hypertension Entresto used infection contrast nephropathy.  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  CT scan did not show any evidence of obstruction last dialysis treatment was 06/20/2019.  Urine output has improved I doubt she will need further dialysis.  Patient's renal function continues to have improved.  She is still has not improved to baseline I will continue to follow  Hypertension/volume appears to be stable.  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  Status post IR drain continue antibiotics PT mobilization.  Appreciate the assistance of Dr. Rosendo Gros   LOS: [address] @TODAY @8 :07 AM

## 2019-06-26 LAB — RENAL FUNCTION PANEL
Albumin: 2.5 g/dL — ABNORMAL LOW (ref 3.5–5.0)
Anion gap: 11 (ref 5–15)
BUN: 14 mg/dL (ref 6–20)
CO2: 23 mmol/L (ref 22–32)
Calcium: 7.8 mg/dL — ABNORMAL LOW (ref 8.9–10.3)
Chloride: 106 mmol/L (ref 98–111)
Creatinine, Ser: 1.47 mg/dL — ABNORMAL HIGH (ref 0.44–1.00)
GFR calc Af Amer: 45 mL/min — ABNORMAL LOW (ref >60)
GFR calc non Af Amer: 39 mL/min — ABNORMAL LOW (ref >60)
Glucose, Bld: 143 mg/dL — ABNORMAL HIGH (ref 70–99)
Phosphorus: 3.4 mg/dL (ref 2.5–4.6)
Potassium: 3.3 mmol/L — ABNORMAL LOW (ref 3.5–5.1)
Sodium: 140 mmol/L (ref 135–145)

## 2019-06-26 LAB — GLUCOSE, CAPILLARY
Glucose-Capillary: 105 mg/dL — ABNORMAL HIGH (ref 70–99)
Glucose-Capillary: 100 mg/dL — ABNORMAL HIGH (ref 70–99)
Glucose-Capillary: 106 mg/dL — ABNORMAL HIGH (ref 70–99)
Glucose-Capillary: 113 mg/dL — ABNORMAL HIGH (ref 70–99)
Glucose-Capillary: 139 mg/dL — ABNORMAL HIGH (ref 70–99)
Glucose-Capillary: 145 mg/dL — ABNORMAL HIGH (ref 70–99)
Glucose-Capillary: 146 mg/dL — ABNORMAL HIGH (ref 70–99)
Glucose-Capillary: 153 mg/dL — ABNORMAL HIGH (ref 70–99)
Glucose-Capillary: 153 mg/dL — ABNORMAL HIGH (ref 70–99)
Glucose-Capillary: 170 mg/dL — ABNORMAL HIGH (ref 70–99)
Glucose-Capillary: 199 mg/dL — ABNORMAL HIGH (ref 70–99)
Glucose-Capillary: 216 mg/dL — ABNORMAL HIGH (ref 70–99)
Glucose-Capillary: 95 mg/dL (ref 70–99)
Glucose-Capillary: 128 mg/dL — ABNORMAL HIGH (ref 70–99)
Glucose-Capillary: 142 mg/dL — ABNORMAL HIGH (ref 70–99)
Glucose-Capillary: 149 mg/dL — ABNORMAL HIGH (ref 70–99)

## 2019-06-26 LAB — BASIC METABOLIC PANEL
Anion gap: 12 (ref 5–15)
BUN: 14 mg/dL (ref 6–20)
CO2: 23 mmol/L (ref 22–32)
Calcium: 7.8 mg/dL — ABNORMAL LOW (ref 8.9–10.3)
Chloride: 105 mmol/L (ref 98–111)
Creatinine, Ser: 1.51 mg/dL — ABNORMAL HIGH (ref 0.44–1.00)
GFR calc Af Amer: 44 mL/min — ABNORMAL LOW (ref >60)
GFR calc non Af Amer: 38 mL/min — ABNORMAL LOW (ref >60)
Glucose, Bld: 140 mg/dL — ABNORMAL HIGH (ref 70–99)
Potassium: 3.4 mmol/L — ABNORMAL LOW (ref 3.5–5.1)
Sodium: 140 mmol/L (ref 135–145)

## 2019-06-26 LAB — CBC
HCT: 30.4 % — ABNORMAL LOW (ref 36.0–46.0)
Hemoglobin: 9.1 g/dL — ABNORMAL LOW (ref 12.0–15.0)
MCH: 26.2 pg (ref 26.0–34.0)
MCHC: 29.9 g/dL — ABNORMAL LOW (ref 30.0–36.0)
MCV: 87.6 fL (ref 80.0–100.0)
Platelets: 231 10*3/uL (ref 150–400)
RBC: 3.47 MIL/uL — ABNORMAL LOW (ref 3.87–5.11)
RDW: 14.9 % (ref 11.5–15.5)
WBC: 11.7 10*3/uL — ABNORMAL HIGH (ref 4.0–10.5)
nRBC: 0 % (ref 0.0–0.2)

## 2019-06-26 NOTE — Progress Notes (Signed)
[facility] ROUNDING NOTE   Subjective:   This is a 59 year old female with history of diabetes hypertension and her associated vasculitis followed by rheumatologist and taking CellCept as well as methotrexate.  She has a history of interstitial lung disease anxiety depression.  She was admitted with  sigmoid diverticulitis and perforation status post IR drain placement.  Baseline renal function appears to be in the normal range with a creatinine about 1.86 in November 2019 and 0.98   06/11/2019.  She developed acute kidney injury secondary to acute tubular necrosis with hypotension Entresto infections and contrast use.  CT scan of abdomen and pelvis did not reveal any evidence of obstruction.  She developed severe azotemia and required dialysis.  She received a last dialysis treatment 06/20/2019.  We are continuing to monitor for recovery.  Her urine output has been excellent.  Urine output 1.5 L 06/25/2019  Blood pressure 133/79 pulse 84 temperature 98.8 O2 sats 92%  Will order renal panel daily.  Wellbutrin 450 mg daily, Celexa 40 mg daily Lovenox 30 mg every 24 hours, insulin sliding scale, Flagyl 500 mg every 8 hours  IV Rocephin 2 g every 24 hours  Objective:  Vital signs in last 24 hours:  Temp:  [98.3 F (36.8 C)-98.8 F (37.1 C)] 98.8 F (37.1 C) (06/17 0400) Pulse Rate:  [76-86] 84 (06/17 0400) Resp:  [15-23] 23 (06/17 0400) BP: (88-149)/(65-98) 133/79 (06/17 0400) SpO2:  [91 %-94 %] 91 % (06/17 0400)  Weight change:  Filed Weights   06/19/19 0720 06/19/19 1059 06/23/19 0359  Weight: (!) 180.1 kg (!) 176.6 kg (!) 180 kg    Intake/Output: I/O last 3 completed shifts: In: 232 [P.O.:222; I.V.:10] Out: 1585 [Urine:1550; Drains:15; Other:20]   Intake/Output this shift:  No intake/output data recorded.  General:NAD, comfortable Heart:RRR, s1s2 nl, no rubs Lungs: Distant breath sound, no wheezing Abdomen:soft, non-distended, right lower quadrant  drain Extremities: No LE edema Neurology: Alert awake and following commands, no asterixis   Basic Metabolic Panel: Recent Labs  Lab 06/20/19 1104 06/20/19 1104 06/21/19 0406 06/21/19 0406 06/22/19 0410 06/22/19 0410 06/23/19 0255 06/24/19 0404 06/25/19 0506  NA 137   < > 139  --  139  --  141 140 140  K 3.3*   < > 3.2*  --  3.4*  --  3.4* 3.5 3.5  CL 101   < > 100  --  101  --  104 105 104  CO2 23   < > 25  --  26  --  23 24 26   GLUCOSE 138*   < > 103*  --  113*  --  113* 118* 103*  BUN 25*   < > 27*  --  29*  --  27* 24* 18  CREATININE 4.48*   < > 4.59*  --  4.43*  --  3.81* 2.83* 1.99*  CALCIUM 8.2*   < > 8.0*   < > 8.2*   < > 8.1* 8.1* 8.0*  PHOS 4.9*  --  5.9*  --  6.0*  --  5.2* 4.0  --    < > = values in this interval not displayed.    Liver Function Tests: Recent Labs  Lab 06/20/19 1104 06/21/19 0406 06/22/19 0410 06/23/19 0255 06/24/19 0404  ALBUMIN 2.4* 2.2* 2.2* 2.4* 2.4*   No results for input(s): LIPASE, AMYLASE in the last 168 hours. No results for input(s): AMMONIA in the last 168 hours.  CBC: Recent Labs  Lab 06/21/19  0406 06/23/19 0255 06/26/19 0414  WBC 15.2* 14.6* 11.7*  HGB 9.4* 9.8* 9.1*  HCT 31.5* 31.9* 30.4*  MCV 86.8 86.0 87.6  PLT 271 247 231    Cardiac Enzymes: No results for input(s): CKTOTAL, CKMB, CKMBINDEX, TROPONINI in the last 168 hours.  BNP: Invalid input(s): POCBNP  CBG: Recent Labs  Lab 06/24/19 1331 06/24/19 1950 06/25/19 0412 06/25/19 2033 06/26/19 0414  GLUCAP 114* 100* 97 148* 105*    Microbiology: Results for orders placed or performed during the hospital encounter of 06/11/19  SARS Coronavirus 2 by RT PCR (hospital order, performed in [facility] hospital lab) Nasopharyngeal Nasopharyngeal Swab     Status: None   Collection Time: 06/11/19  6:16 PM   Specimen: Nasopharyngeal Swab  Result Value Ref Range Status   SARS Coronavirus 2 NEGATIVE NEGATIVE Final    Comment: (NOTE) SARS-CoV-2 target  nucleic acids are NOT DETECTED. The SARS-CoV-2 RNA is generally detectable in upper and lower respiratory specimens during the acute phase of infection. The lowest concentration of SARS-CoV-2 viral copies this assay can detect is 250 copies / mL. A negative result does not preclude SARS-CoV-2 infection and should not be used as the sole basis for treatment or other patient management decisions.  A negative result may occur with improper specimen collection / handling, submission of specimen other than nasopharyngeal swab, presence of viral mutation(s) within the areas targeted by this assay, and inadequate number of viral copies (<250 copies / mL). A negative result must be combined with clinical observations, patient history, and epidemiological information. Fact Sheet for Patients:   StrictlyIdeas.no Fact Sheet for Healthcare Providers: BankingDealers.co.za This test is not yet approved or cleared  by the [place] FDA and has been authorized for detection and/or diagnosis of SARS-CoV-2 by FDA under an Emergency Use Authorization (EUA).  This EUA will remain in effect (meaning this test can be used) for the duration of the COVID-19 declaration under Section 564(b)(1) of the Act, 21 U.S.C. section 360bbb-3(b)(1), unless the authorization is terminated or revoked sooner. Performed at [facility], [place] [address], [place], [place] [address]   Culture, blood (Routine X 2) w Reflex to ID Panel     Status: Abnormal   Collection Time: 06/11/19  8:20 PM   Specimen: BLOOD  Result Value Ref Range Status   Specimen Description BLOOD BLOOD LEFT FOREARM  Final   Special Requests   Final    BOTTLES DRAWN AEROBIC AND ANAEROBIC Blood Culture results may not be optimal due to an inadequate volume of blood received in culture bottles   Culture  Setup Time (A)  Final    GRAM VARIABLE ROD ANAEROBIC BOTTLE ONLY CRITICAL RESULT CALLED TO, READ  BACK BY AND VERIFIED WITH: PHARMD J FRENS AT 0815 06/16/19 BY L BENFIELD    Culture (A)  Final    BACTEROIDES SPECIES BETA LACTAMASE NEGATIVE Performed at [facility], [place] [address] [place],  [address]    Report Status 06/20/2019 FINAL  Final  Blood Culture ID Panel (Reflexed)     Status: None   Collection Time: 06/11/19  8:20 PM  Result Value Ref Range Status   Enterococcus species NOT DETECTED NOT DETECTED Final   Listeria monocytogenes NOT DETECTED NOT DETECTED Final   Staphylococcus species NOT DETECTED NOT DETECTED Final   Staphylococcus aureus (BCID) NOT DETECTED NOT DETECTED Final   Streptococcus species NOT DETECTED NOT DETECTED Final   Streptococcus agalactiae NOT DETECTED NOT DETECTED Final  Streptococcus pneumoniae NOT DETECTED NOT DETECTED Final   Streptococcus pyogenes NOT DETECTED NOT DETECTED Final   Acinetobacter baumannii NOT DETECTED NOT DETECTED Final   Enterobacteriaceae species NOT DETECTED NOT DETECTED Final   Enterobacter cloacae complex NOT DETECTED NOT DETECTED Final   Escherichia coli NOT DETECTED NOT DETECTED Final   Klebsiella oxytoca NOT DETECTED NOT DETECTED Final   Klebsiella pneumoniae NOT DETECTED NOT DETECTED Final   Proteus species NOT DETECTED NOT DETECTED Final   Serratia marcescens NOT DETECTED NOT DETECTED Final   Haemophilus influenzae NOT DETECTED NOT DETECTED Final   Neisseria meningitidis NOT DETECTED NOT DETECTED Final   Pseudomonas aeruginosa NOT DETECTED NOT DETECTED Final   Candida albicans NOT DETECTED NOT DETECTED Final   Candida glabrata NOT DETECTED NOT DETECTED Final   Candida krusei NOT DETECTED NOT DETECTED Final   Candida parapsilosis NOT DETECTED NOT DETECTED Final   Candida tropicalis NOT DETECTED NOT DETECTED Final    Comment: Performed at [facility], [place] [address], [place], [place] [address]  Culture, blood (Routine X 2) w Reflex to ID Panel     Status: None   Collection Time: 06/11/19 10:00 PM    Specimen: BLOOD  Result Value Ref Range Status   Specimen Description BLOOD RIGHT HAND  Final   Special Requests   Final    BOTTLES DRAWN AEROBIC ONLY Blood Culture results may not be optimal due to an inadequate volume of blood received in culture bottles   Culture   Final    NO GROWTH 5 DAYS Performed at [facility], [place] [address] [place], [place] [address]    Report Status 06/16/2019 FINAL  Final  MRSA PCR Screening     Status: None   Collection Time: 06/15/19  9:26 AM   Specimen: Nasopharyngeal  Result Value Ref Range Status   MRSA by PCR NEGATIVE NEGATIVE Final    Comment:        The GeneXpert MRSA Assay (FDA approved for NASAL specimens only), is one component of a comprehensive MRSA colonization surveillance program. It is not intended to diagnose MRSA infection nor to guide or monitor treatment for MRSA infections. Performed at [facility], [place] [address] [place], [place] [address]     Coagulation Studies: No results for input(s): LABPROT, INR in the last 72 hours.  Urinalysis: No results for input(s): COLORURINE, LABSPEC, PHURINE, GLUCOSEU, HGBUR, BILIRUBINUR, KETONESUR, PROTEINUR, UROBILINOGEN, NITRITE, LEUKOCYTESUR in the last 72 hours.  Invalid input(s): APPERANCEUR    Imaging: IR Catheter Tube Change  Result Date: 06/24/2019 INDICATION: Presumed diverticular abscess, status post 12 French pigtail drain catheter placement 06/13/2019. Follow-up CT shows continued enlargement of the collection. Drain revision is requested. EXAM: EXCHANGE/UPSIZING PELVIC DRAIN CATHETER UNDER FLUOROSCOPIC GUIDANCE MEDICATIONS: The patient is currently admitted to the hospital and receiving intravenous antibiotics. The antibiotics were administered within an appropriate time frame prior to the initiation of the procedure. ANESTHESIA/SEDATION: Intravenous Fentanyl 65mcg and Versed 1mg  were administered as conscious sedation during continuous monitoring of the patient's  level of consciousness and physiological / cardiorespiratory status by the radiology RN, with a total moderate sedation time of 10 minutes. COMPLICATIONS: None immediate. PROCEDURE: Informed written consent was obtained from the patient after a thorough discussion of the procedural risks, benefits and alternatives. All questions were addressed. Maximal Sterile Barrier Technique was utilized including caps, mask, sterile gowns, sterile gloves, sterile drape, hand hygiene and skin antiseptic. A timeout was performed prior to the initiation of the  procedure. Skin surrounding the previously placed drain was infiltrated with 1% lidocaine. Small contrast injection opacified the right lower quadrant collection freely. The previously placed catheter was cut and exchanged over a short Amplatz wire for a 16 French pigtail drain catheter, formed centrally within the collection. Contrast injection confirmed appropriate positioning and patency although the pigtail would not fully lock. The catheter was secured externally with 0 Prolene suture and StatLock and placed to gravity drain bag. The patient tolerated the procedure well. IMPRESSION: 1. Technically successful exchange and upsizing of right lower quadrant drain to a 16 [place] device. Electronically Signed   By: Lucrezia Europe M.D.   On: 06/24/2019 16:24     Medications:   . cefTRIAXone (ROCEPHIN)  IV 2 g (06/25/19 1415)  . sodium chloride Stopped (06/12/19 0949)   . buPROPion  450 mg Oral Daily  . carvedilol  12.5 mg Oral BID WC  . Chlorhexidine Gluconate Cloth  6 each Topical Daily  . citalopram  40 mg Oral Daily  . enoxaparin (LOVENOX) injection  90 mg Subcutaneous Q24H  . feeding supplement (NEPRO CARB STEADY)  237 mL Oral BID BM  . insulin aspart  0-9 Units Subcutaneous Q4H  . metroNIDAZOLE  500 mg Oral Q8H  . nystatin   Topical BID  . pantoprazole  40 mg Oral Daily  . saccharomyces boulardii  250 mg Oral BID  . sodium chloride flush  10-40 mL  Intracatheter Q12H  . sodium chloride flush  5 mL Intracatheter Q8H   acetaminophen **OR** acetaminophen, benzonatate, docusate sodium, gabapentin, HYDROmorphone (DILAUDID) injection, labetalol, LORazepam, ondansetron **OR** ondansetron (ZOFRAN) IV, sodium chloride flush  Assessment/ Plan:   Acute kidney injury secondary to acute tubular necrosis hypertension Entresto used infection contrast nephropathy.  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  CT scan did not show any evidence of obstruction last dialysis treatment was 06/20/2019.  Urine output has improved I doubt she will need further dialysis.  Patient's renal function continues to have improved.  Renal panel not ordered this morning.  We will order daily anticipate return of renal function.  Have no problems with PICC line for patient.  Hypertension/volume appears to be stable.  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  Status post IR drain continue antibiotics PT mobilization.  Appreciate the assistance of Dr. Rosendo Gros  Will sign off patient today please call back if needed.  Thank you for very interesting consult.   LOS: [place] @TODAY @7 :41 AM

## 2019-06-26 NOTE — NC FL2 (Addendum)
[place] LEVEL OF CARE SCREENING TOOL     IDENTIFICATION  Patient Name: [name] Birthdate: 1960-04-30 Sex: female Admission Date (Current Location): 06/11/2019  [facility] and [place] Number:  Herbalist and Address:  [facility], [place] [address] [place], [place] [address]      Provider Number: [id number]  Attending Physician Name and Address:  Charlynne Cousins, MD  Relative Name and Phone Number:  Jenny Reichmann, spouse, [phone]    Current Level of Care: Hospital Recommended Level of Care: [place] Prior Approval Number:    Date Approved/Denied:   PASRR Number: [id number]  Discharge Plan: SNF    Current Diagnoses: Patient Active Problem List   Diagnosis Date Noted  . Bacteremia due to bacteroides species 06/25/2019  . Candidal intertrigo 06/25/2019  . Pressure injury of skin 06/23/2019  . Colonic diverticular abscess 06/23/2019  . Acute kidney injury (AKI) with acute tubular necrosis (ATN) ([place]) 06/14/2019  . Acute diverticulitis 06/11/2019  . Preoperative clearance 05/23/2019  . Hypertension 12/25/2017  . Nonintractable headache 11/29/2017  . Cardiac LV ejection fraction of 35-39% 11/26/2017  . Medication management 11/26/2017  . Chronic low back pain 10/16/2017  . Pseudotumor cerebri 10/15/2017  . Chronic right-sided low back pain with right-sided sciatica 12/26/2016  . Paresthesia 12/26/2016  . Right leg swelling 12/26/2016  . Physical deconditioning 04/30/2015  . Dyspnea 05/11/2014  . Leukocytosis 05/11/2014  . Chronic fatigue 05/11/2014  . Morbid obesity ([place]) 01/22/2014  . ILD (interstitial lung disease) ([place]) 01/22/2014  . Acute bronchitis 01/08/2014  . Chronic respiratory failure with hypoxia ([place]) 11/04/2013  . 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[place]) 09/03/2013  . 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  . Microscopic polyangiitis ([place]) 08/08/2013  . Hyperglycemia 07/24/2013  . GERD  (gastroesophageal reflux disease) 07/24/2013  . Hypokalemia 07/20/2013  . 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[place]) 06/24/2013  . Hemoptysis 06/23/2013  . Acute blood loss anemia 06/23/2013  . Respiratory failure with hypoxia ([place]) 06/23/2013  . 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 Perforation of sigmoid colon - stercoral 09/27/2012  . Constipation, chronic 09/27/2012  . Obesity (BMI 30-39.9) 09/23/2012  . Anxiety associated with depression 09/23/2012  . Hx of tobacco use, presenting hazards to health 09/23/2012  . H/O gastroesophageal reflux (GERD) 09/23/2012  . At high risk for falls 09/23/2012    Orientation RESPIRATION BLADDER Height & Weight     Self, Time, Situation, Place  Normal Continent, Indwelling catheter Weight: (!) 396 lb 13.3 oz (180 kg) Height:  5\' 8"  (172.7 cm)  BEHAVIORAL SYMPTOMS/MOOD NEUROLOGICAL BOWEL NUTRITION STATUS      Incontinent Diet (Please see DC Summary)  AMBULATORY STATUS COMMUNICATION OF NEEDS Skin   Extensive Assist Verbally Surgical wounds (Closed incision on vagina; cook ultrathene multipurpose drainage catheter drain in abdomen)                       Personal Care Assistance Level of Assistance  Bathing, Feeding, Dressing Bathing Assistance: Maximum assistance Feeding assistance: Limited assistance Dressing Assistance: Limited assistance     Functional Limitations Info  Sight, Hearing, Speech Sight Info: Adequate Hearing Info: Adequate Speech Info: Adequate    SPECIAL CARE FACTORS FREQUENCY  PT (By licensed PT), OT (By licensed OT)     PT Frequency: 5x/week OT Frequency: 4x/week            Contractures Contractures Info: Not present    Additional Factors Info  Code Status, Allergies, Psychotropic, Insulin Sliding Scale  Code Status Info: Full Allergies Info: Guaifenesin, Mucinex (Guaifenesin Er), Sulfa Antibiotics Psychotropic Info: Wellbutrin; Celexa Insulin Sliding Scale Info: See dc summary for dose        Current Medications (06/26/2019):  This is the current hospital active medication list Current Facility-Administered Medications  Medication Dose Route Frequency Provider Last Rate Last Admin  . acetaminophen (TYLENOL) tablet 650 mg  650 mg Oral Q6H PRN Donne Hazel, MD   650 mg at 06/19/19 2046   Or  . acetaminophen (TYLENOL) suppository 650 mg  650 mg Rectal Q6H PRN Donne Hazel, MD      . benzonatate (TESSALON) capsule 100 mg  100 mg Oral BID PRN Vianne Bulls, MD   100 mg at 06/22/19 2126  . buPROPion (WELLBUTRIN XL) 24 hr tablet 450 mg  450 mg Oral Daily Donne Hazel, MD   450 mg at 06/26/19 0952  . carvedilol (COREG) tablet 12.5 mg  12.5 mg Oral BID WC Oswald Hillock, MD   12.5 mg at 06/26/19 0953  . cefTRIAXone (ROCEPHIN) 2 g in sodium chloride 0.9 % 100 mL IVPB  2 g Intravenous Q24H Donne Hazel, MD 200 mL/hr at 06/26/19 1331 2 g at 06/26/19 1331  . Chlorhexidine Gluconate Cloth 2 % PADS 6 each  6 each Topical Daily Donne Hazel, MD   6 each at 06/26/19 1200  . citalopram (CELEXA) tablet 40 mg  40 mg Oral Daily Donne Hazel, MD   40 mg at 06/26/19 0953  . docusate sodium (COLACE) capsule 100 mg  100 mg Oral BID PRN Donne Hazel, MD      . enoxaparin (LOVENOX) injection 90 mg  90 mg Subcutaneous Q24H Karren Cobble, RPH   90 mg at 06/25/19 2032  . feeding supplement (NEPRO CARB STEADY) liquid 237 mL  237 mL Oral BID BM Donne Hazel, MD   237 mL at 06/26/19 1326  . gabapentin (NEURONTIN) capsule 400 mg  400 mg Oral QHS PRN Donne Hazel, MD   400 mg at 06/15/19 2120  . HYDROmorphone (DILAUDID) injection 0.5-1 mg  0.5-1 mg Intravenous Q2H PRN Donne Hazel, MD   1 mg at 06/26/19 1327  . insulin aspart (novoLOG) injection 0-9 Units  0-9 Units Subcutaneous Q4H Donne Hazel, MD   1 Units at 06/26/19 1339  . labetalol (NORMODYNE) injection 20 mg  20 mg Intravenous Q2H PRN Oswald Hillock, MD      . LORazepam (ATIVAN) injection 0.5 mg  0.5 mg Intravenous Q6H  PRN Donne Hazel, MD   0.5 mg at 06/25/19 2348  . metroNIDAZOLE (FLAGYL) tablet 500 mg  500 mg Oral Q8H Karren Cobble, RPH   500 mg at 06/26/19 1326  . nystatin (MYCOSTATIN/NYSTOP) topical powder   Topical BID Nicholson Callas, NP   Given at 06/26/19 [phone]  . ondansetron (ZOFRAN) tablet 4 mg  4 mg Oral Q6H PRN Donne Hazel, MD       Or  . ondansetron [facility]) injection 4 mg  4 mg Intravenous Q6H PRN Donne Hazel, MD   4 mg at 06/25/19 2032  . pantoprazole (PROTONIX) EC tablet 40 mg  40 mg Oral Daily Karren Cobble, RPH   40 mg at 06/26/19 0953  . saccharomyces boulardii (FLORASTOR) capsule 250 mg  250 mg Oral BID Meuth, Brooke A, PA-C   250 mg at 06/26/19 0953  . sodium chloride 0.9 % bolus 500 mL  500 mL Intravenous Once Donne Hazel, MD   Held at 06/12/19 [phone]  . sodium chloride flush (NS) 0.9 % injection 10-40 mL  10-40 mL Intracatheter Q12H Oswald Hillock, MD   10 mL at 06/25/19 2034  . sodium chloride flush (NS) 0.9 % injection 10-40 mL  10-40 mL Intracatheter PRN Oswald Hillock, MD   10 mL at 06/25/19 0512  . sodium chloride flush (NS) 0.9 % injection 5 mL  5 mL Intracatheter Q8H Donne Hazel, MD   5 mL at 06/26/19 8719     Discharge Medications: Please see discharge summary for a list of discharge medications.  Relevant Imaging Results:  Relevant Lab Results:   Additional Information SSN:  [id number]        COVID negative 06/11/19, has received vaccines.  Benard Halsted, LCSW

## 2019-06-26 NOTE — Progress Notes (Signed)
Patient ID: [name], female   DOB: February 04, 1960, 59 y.o.   MRN: [id number]         [facility] for Infectious Disease  Date of Admission:  06/11/2019   Total days of antibiotics 16         ASSESSMENT: She continues slow improvement on therapy for diverticular abscess.  PLAN: 1. Continue ceftriaxone and metronidazole 2. Monitor drain output  Principal Problem:   Colonic diverticular abscess Active Problems:   Acute diverticulitis   Obesity (BMI 30-39.9)   ILD (interstitial lung disease) ([place])   Hypertension   Acute kidney injury (AKI) with acute tubular necrosis (ATN) ([facility])   Pressure injury of skin   Bacteremia due to bacteroides species   Candidal intertrigo   Scheduled Meds: . buPROPion  450 mg Oral Daily  . carvedilol  12.5 mg Oral BID WC  . Chlorhexidine Gluconate Cloth  6 each Topical Daily  . citalopram  40 mg Oral Daily  . enoxaparin (LOVENOX) injection  90 mg Subcutaneous Q24H  . feeding supplement (NEPRO CARB STEADY)  237 mL Oral BID BM  . insulin aspart  0-9 Units Subcutaneous Q4H  . metroNIDAZOLE  500 mg Oral Q8H  . nystatin   Topical BID  . pantoprazole  40 mg Oral Daily  . saccharomyces boulardii  250 mg Oral BID  . sodium chloride flush  10-40 mL Intracatheter Q12H  . sodium chloride flush  5 mL Intracatheter Q8H   Continuous Infusions: . cefTRIAXone (ROCEPHIN)  IV 2 g (06/25/19 1415)  . sodium chloride Stopped (06/12/19 0949)   PRN Meds:.acetaminophen **OR** acetaminophen, benzonatate, docusate sodium, gabapentin, HYDROmorphone (DILAUDID) injection, labetalol, LORazepam, ondansetron **OR** ondansetron (ZOFRAN) IV, sodium chloride flush   SUBJECTIVE: She is feeling better.  She was able to sit up for several hours yesterday.  Her drain out and recorded recently.  She says that there was a large amount of drainage in the bag this morning before it was emptied.  Review of Systems: Review of Systems  Constitutional: Negative for chills,  diaphoresis and fever.  Gastrointestinal: Negative for abdominal pain.    Allergies  Allergen Reactions  . Guaifenesin Nausea And Vomiting and Other (See Comments)    un  . Mucinex [Guaifenesin Er] Nausea And Vomiting  . Sulfa Antibiotics Nausea And Vomiting    OBJECTIVE: Vitals:   06/25/19 2000 06/26/19 0015 06/26/19 0400 06/26/19 0953  BP: (!) 147/68 (!) 142/65 133/79 133/65  Pulse: 84 78 84 84  Resp: 20 20 (!) 23   Temp: 98.7 F (37.1 C) 98.3 F (36.8 C) 98.8 F (37.1 C)   TempSrc: Oral Oral Oral   SpO2: 94% 92% 91%   Weight:      Height:       Body mass index is 60.34 kg/m.  Physical Exam Constitutional:      Comments: She is alert and talkative.  Abdominal:     Comments: Thin bloody fluid in drain bag.     Lab Results Lab Results  Component Value Date   WBC 11.7 (H) 06/26/2019   HGB 9.1 (L) 06/26/2019   HCT 30.4 (L) 06/26/2019   MCV 87.6 06/26/2019   PLT 231 06/26/2019    Lab Results  Component Value Date   CREATININE 1.99 (H) 06/25/2019   BUN 18 06/25/2019   NA 140 06/25/2019   K 3.5 06/25/2019   CL 104 06/25/2019   CO2 26 06/25/2019    Lab Results  Component Value Date   ALT  22 06/18/2019   AST 38 06/18/2019   ALKPHOS 102 06/18/2019   BILITOT 0.5 06/18/2019     Microbiology: No results found for this or any previous visit (from the past 240 hour(s)).  [name], MD [facility] for Infectious [place] [facility] [phone] pager   [phone] cell 06/26/2019, 11:02 AM

## 2019-06-26 NOTE — Progress Notes (Signed)
[facility] PROGRESS NOTE    Progress Note  [name]  HMC:[id number] DOB: 12/20/1960 DOA: 06/11/2019 PCP: Christain Sacramento, MD     Brief Narrative:   [name] is an 59 y.o. female past medical history of prior sigmoid diverticulitis with perforation in 2014 history of ILD diabetes mellitus type 2 comes into the hospital for abdominal pain in the ED CT scan showed sigmoid diverticulitis she was started empirically on IV Zosyn, repeated CT showed drain in place with a fluid collection measuring 12 x 8 x 11 which is increased in size compared to previous.  General surgery on board and appreciate assistance, IR had to be reconsulted.  Assessment/Plan:   Sepsis due to acute diverticulitis Repeated CT scan was ordered that showed large collection which is worse compared to previous imaging. IR was reconsulted and drain 16 [place] placement was done on 06/14/2019. Blood cultures show gram variable rods The gravity drain was removed as he had clot and a bulb was placed. She will follow up with the IR drain clinic as an outpatient. Therapy evaluation for home with 24-hour supervision or for skilled nursing facility pending  Orthostatic hypotension: Per physical therapy, she is currently not on any antihypertensive medication. Likely deconditioning orthostasis now resolved.  Acute kidney injury leading to ATN: In the setting of hypotension, contrast-induced nephropathy and Entresto. Nephrology was consulted he was started on hemodialysis due to uremic symptoms, his creatinine is slowly improving.  Cont to have good urine output basic metabolic panel is pending this morning if improved will discontinue line.  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  Controlled diabetes mellitus type 2: With a hemoglobin A1c of 7.0.  Four-point glycemic agents were held on admission continue  long-acting insulin plus sliding scale.  Hypokalemia: Repleted orally now improved at 3.5.  Essential hypertension Entresto was stopped on admission as her blood pressure started to improve and became elevated she was restarted back on her Coreg. Will need to stay off Entresto for at least 2 weeks.    DVT prophylaxis: lovenox Family Communication:none Status is: Inpatient  Remains inpatient appropriate because:Hemodynamically unstable   Dispo: The patient is from: Home              Anticipated d/c is to: Home              Anticipated d/c date is: > 3 days              Patient currently is not medically stable to d/c.  Code Status:     Code Status Orders  (From admission, onward)         Start     Ordered   06/11/19 1845  Full code  Continuous        06/11/19 1845        Code Status History    Date Active Date Inactive Code Status Order ID Comments User Context   06/03/2019 0624 06/03/2019 1507 Full Code [id number]  Everlene Farrier, MD Inpatient   06/23/2013 1958 07/22/2013 1426 Full Code [id number]  Caren Griffins, MD ED   09/23/2012 1836 10/05/2012 1516 Full Code [id number]  Jackolyn Confer, MD ED   Advance Care Planning Activity        IV Access:    Peripheral IV   Procedures and diagnostic studies:   IR Catheter Tube Change  Result Date: 06/24/2019 INDICATION: Presumed diverticular  abscess, status post 12 French pigtail drain catheter placement 06/13/2019. Follow-up CT shows continued enlargement of the collection. Drain revision is requested. EXAM: EXCHANGE/UPSIZING PELVIC DRAIN CATHETER UNDER FLUOROSCOPIC GUIDANCE MEDICATIONS: The patient is currently admitted to the hospital and receiving intravenous antibiotics. The antibiotics were administered within an appropriate time frame prior to the initiation of the procedure. ANESTHESIA/SEDATION: Intravenous Fentanyl 61mcg and Versed 1mg  were administered as conscious sedation during continuous monitoring of the  patient's level of consciousness and physiological / cardiorespiratory status by the radiology RN, with a total moderate sedation time of 10 minutes. COMPLICATIONS: None immediate. PROCEDURE: Informed written consent was obtained from the patient after a thorough discussion of the procedural risks, benefits and alternatives. All questions were addressed. Maximal Sterile Barrier Technique was utilized including caps, mask, sterile gowns, sterile gloves, sterile drape, hand hygiene and skin antiseptic. A timeout was performed prior to the initiation of the procedure. Skin surrounding the previously placed drain was infiltrated with 1% lidocaine. Small contrast injection opacified the right lower quadrant collection freely. The previously placed catheter was cut and exchanged over a short Amplatz wire for a 16 French pigtail drain catheter, formed centrally within the collection. Contrast injection confirmed appropriate positioning and patency although the pigtail would not fully lock. The catheter was secured externally with 0 Prolene suture and StatLock and placed to gravity drain bag. The patient tolerated the procedure well. IMPRESSION: 1. Technically successful exchange and upsizing of right lower quadrant drain to a 16 [place] device. Electronically Signed   By: Lucrezia Europe M.D.   On: 06/24/2019 16:24     Medical Consultants:    None.  Anti-Infectives:   Rocephin and flagyl  Subjective:    [name] she relates she feels better today up in the chair tolerated her diet.  Objective:    Vitals:   06/25/19 2000 06/26/19 0015 06/26/19 0400 06/26/19 0953  BP: (!) 147/68 (!) 142/65 133/79 133/65  Pulse: 84 78 84 84  Resp: 20 20 (!) 23   Temp: 98.7 F (37.1 C) 98.3 F (36.8 C) 98.8 F (37.1 C)   TempSrc: Oral Oral Oral   SpO2: 94% 92% 91%   Weight:      Height:       SpO2: 91 % O2 Flow Rate (L/min): 2 L/min   Intake/Output Summary (Last 24 hours) at 06/26/2019 1128 Last data  filed at 06/26/2019 1100 Gross per 24 hour  Intake --  Output 1920 ml  Net -1920 ml   Filed Weights   06/19/19 0720 06/19/19 1059 06/23/19 0359  Weight: (!) 180.1 kg (!) 176.6 kg (!) 180 kg    Exam: General exam: In no acute distress, morbidly obese Respiratory system: Good air movement and clear to auscultation. Cardiovascular system: S1 & S2 heard, RRR. No JVD. Gastrointestinal system: Abdomen is nondistended, soft and nontender.  Extremities: No pedal edema. Skin: No rashes, lesions or ulcers Psychiatry: Judgement and insight appear normal.   Data Reviewed:    Labs: Basic Metabolic Panel: Recent Labs  Lab 06/20/19 1104 06/20/19 1104 06/21/19 0406 06/21/19 0406 06/22/19 0410 06/22/19 0410 06/23/19 0255 06/23/19 0255 06/24/19 0404 06/25/19 0506  NA 137   < > 139  --  139  --  141  --  140 140  K 3.3*   < > 3.2*   < > 3.4*   < > 3.4*   < > 3.5 3.5  CL 101   < > 100  --  101  --  104  --  105 104  CO2 23   < > 25  --  26  --  23  --  24 26  GLUCOSE 138*   < > 103*  --  113*  --  113*  --  118* 103*  BUN 25*   < > 27*  --  29*  --  27*  --  24* 18  CREATININE 4.48*   < > 4.59*  --  4.43*  --  3.81*  --  2.83* 1.99*  CALCIUM 8.2*   < > 8.0*  --  8.2*  --  8.1*  --  8.1* 8.0*  PHOS 4.9*  --  5.9*  --  6.0*  --  5.2*  --  4.0  --    < > = values in this interval not displayed.   GFR Estimated Creatinine Clearance: 53.7 mL/min (A) (by C-G formula based on SCr of 1.99 mg/dL (H)). Liver Function Tests: Recent Labs  Lab 06/20/19 1104 06/21/19 0406 06/22/19 0410 06/23/19 0255 06/24/19 0404  ALBUMIN 2.4* 2.2* 2.2* 2.4* 2.4*   No results for input(s): LIPASE, AMYLASE in the last 168 hours. No results for input(s): AMMONIA in the last 168 hours. Coagulation profile No results for input(s): INR, PROTIME in the last 168 hours. COVID-19 Labs  No results for input(s): DDIMER, FERRITIN, LDH, CRP in the last 72 hours.  Lab Results  Component Value Date   SARSCOV2NAA  NEGATIVE 06/11/2019   Green NEGATIVE 05/30/2019    CBC: Recent Labs  Lab 06/21/19 0406 06/23/19 0255 06/26/19 0414  WBC 15.2* 14.6* 11.7*  HGB 9.4* 9.8* 9.1*  HCT 31.5* 31.9* 30.4*  MCV 86.8 86.0 87.6  PLT 271 247 231   Cardiac Enzymes: No results for input(s): CKTOTAL, CKMB, CKMBINDEX, TROPONINI in the last 168 hours. BNP (last 3 results) No results for input(s): PROBNP in the last 8760 hours. CBG: Recent Labs  Lab 06/24/19 1950 06/25/19 0412 06/25/19 2033 06/26/19 0414 06/26/19 0808  GLUCAP 100* 97 148* 105* 100*   D-Dimer: No results for input(s): DDIMER in the last 72 hours. Hgb A1c: No results for input(s): HGBA1C in the last 72 hours. Lipid Profile: No results for input(s): CHOL, HDL, LDLCALC, TRIG, CHOLHDL, LDLDIRECT in the last 72 hours. Thyroid function studies: No results for input(s): TSH, T4TOTAL, T3FREE, THYROIDAB in the last 72 hours.  Invalid input(s): FREET3 Anemia work up: No results for input(s): VITAMINB12, FOLATE, FERRITIN, TIBC, IRON, RETICCTPCT in the last 72 hours. Sepsis Labs: Recent Labs  Lab 06/21/19 0406 06/23/19 0255 06/26/19 0414  WBC 15.2* 14.6* 11.7*   Microbiology No results found for this or any previous visit (from the past 240 hour(s)).   Medications:   . buPROPion  450 mg Oral Daily  . carvedilol  12.5 mg Oral BID WC  . Chlorhexidine Gluconate Cloth  6 each Topical Daily  . citalopram  40 mg Oral Daily  . enoxaparin (LOVENOX) injection  90 mg Subcutaneous Q24H  . feeding supplement (NEPRO CARB STEADY)  237 mL Oral BID BM  . insulin aspart  0-9 Units Subcutaneous Q4H  . metroNIDAZOLE  500 mg Oral Q8H  . nystatin   Topical BID  . pantoprazole  40 mg Oral Daily  . saccharomyces boulardii  250 mg Oral BID  . sodium chloride flush  10-40 mL Intracatheter Q12H  . sodium chloride flush  5 mL Intracatheter Q8H   Continuous Infusions: . cefTRIAXone (ROCEPHIN)  IV 2 g (06/25/19 1415)  .  sodium chloride Stopped  (06/12/19 0949)      LOS: 15 days   Charlynne Cousins  [facility]  06/26/2019, 11:28 AM

## 2019-06-26 NOTE — Progress Notes (Signed)
Physical Therapy Treatment Patient Details Name: [name] MRN: [id number] DOB: 12/27/1960 Today's Date: 06/26/2019    History of Present Illness 59 y.o. 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 She presented to the ED with complaints of marked lower quadrant pain. Pt admitted for sigmoid diverticulitis with perforation and abscess. Drain placed 6/4.    PT Comments    Pt still struggling with getting OOB, scooting and standing from lower heights.  She is anxious during standing and short distance gait with the RW.  Discussed with pt thinking seriously about going to SNF rehab for more intensity, before home.    Follow Up Recommendations  SNF;Other (comment) (I would like to see her go ST SNF 7-10 days then HHPT.  Has not progressed far enough to care for herself well enough.  She likely will refuse and go home with HHPT.     Equipment Recommendations  None recommended by PT    Recommendations for Other Services       Precautions / Restrictions Precautions Precautions: Fall Other Brace: R LQ drain    Mobility  Bed Mobility Overal bed mobility: Needs Assistance Bed Mobility: Supine to Sit     Supine to sit: Mod assist     General bed mobility comments: cues for best technique, least painful, assist to come up via L elbow to L hand  then to EOB  Transfers Overall transfer level: Needs assistance Equipment used: Rolling walker (2 wheeled) Transfers: Sit to/from Stand Sit to Stand: Min assist         General transfer comment: cues for hand placement ( trying to break bad habits), stability assist.  Ambulation/Gait Ambulation/Gait assistance: Min assist Gait Distance (Feet): 20 Feet Assistive device: Rolling walker (2 wheeled) Gait Pattern/deviations: Step-through pattern   Gait velocity interpretation: <1.31 ft/sec, indicative of  household ambulator General Gait Details: pt mildly unsteady overall.  Short slow step length, heavy use of the RW.  Quickly becomes anxious.   Stairs             Wheelchair Mobility    Modified Rankin (Stroke Patients Only)       Balance Overall balance assessment: Needs assistance Sitting-balance support: Feet supported;No upper extremity supported Sitting balance-Leahy Scale: Fair Sitting balance - Comments: no UE's needed to balance on low air loss bed.   Standing balance support: Bilateral upper extremity supported;Single extremity supported Standing balance-Leahy Scale: Poor Standing balance comment: reliant on the AD for support.                            Cognition Arousal/Alertness: Awake/alert Behavior During Therapy: WFL for tasks assessed/performed Overall Cognitive Status: Within Functional Limits for tasks assessed                                        Exercises      General Comments        Pertinent Vitals/Pain Pain Assessment: Faces Faces Pain Scale: Hurts little more Pain Location: abdomen (R lower quadrant) Pain Descriptors / Indicators: Discomfort;Guarding Pain Intervention(s): Monitored during session;Premedicated before session    Home Living                      Prior Function  PT Goals (current goals can now be found in the care plan section) Acute Rehab PT Goals PT Goal Formulation: With patient Time For Goal Achievement: 06/30/19 Potential to Achieve Goals: Good Progress towards PT goals: Progressing toward goals    Frequency    Min 3X/week      PT Plan Current plan remains appropriate    Co-evaluation              AM-PAC PT "6 Clicks" Mobility   Outcome Measure  Help needed turning from your back to your side while in a flat bed without using bedrails?: A Little Help needed moving from lying on your back to sitting on the side of a flat bed without using  bedrails?: A Lot Help needed moving to and from a bed to a chair (including a wheelchair)?: A Little Help needed standing up from a chair using your arms (e.g., wheelchair or bedside chair)?: A Little Help needed to walk in hospital room?: A Little Help needed climbing 3-5 steps with a railing? : A Lot 6 Click Score: 16    End of Session   Activity Tolerance: Patient tolerated treatment well Patient left: in chair;with call bell/phone within reach;with chair alarm set Nurse Communication: Mobility status PT Visit Diagnosis: Other abnormalities of gait and mobility (R26.89);Muscle weakness (generalized) (M62.81);Difficulty in walking, not elsewhere classified (R26.2)     Time: 1110-1140 PT Time Calculation (min) (ACUTE ONLY): 30 min  Charges:  $Gait Training: 8-22 mins $Therapeutic Activity: 8-22 mins                     06/26/2019  [name] Carne., PT Acute Rehabilitation Services [phone]  (pager) [phone]  (office)   [name] 06/26/2019, 12:16 PM

## 2019-06-26 NOTE — Progress Notes (Addendum)
Referring Physician(s): Margie Billet, PA-C  Supervising Physician: Arne Cleveland  Patient Status:  [facility] - In-pt  Chief Complaint:  Drain follow up  Brief History: [name] is a 59 y.o, female with history of prior sigmoid diverticulitis with perforation  2014, DM, HTN.   She presented to the emergency department with worsening RLQ pain and constipation.    She was found to have leokocytosis, a RLQ fluid collection and  AKI.    She underwent drain placement by Dr. Vernard Gambles to treat the right pelvic fluid collection on 06/13/19.   Follow up CT on 06/20/19 showed a shows a larger collection.  She had drain upsize on 06/24/19 by Dr. Vernard Gambles.  Her drain has had little output over the past couple of days.   Subjective:  Lying in bed. No complaints.   Allergies: Guaifenesin, Mucinex [guaifenesin er], and Sulfa antibiotics  Medications: Prior to Admission medications   Medication Sig Start Date End Date Taking? Authorizing Provider  ALPRAZolam Duanne Moron) 1 MG tablet Take 1 tablet (1 mg total) by mouth 3 (three) times daily as needed for anxiety. 08/22/13  Yes Blanchie Serve, MD  buPROPion (WELLBUTRIN XL) 150 MG 24 hr tablet Take 450 mg by mouth daily.   Yes [provider]  carvedilol (COREG) 12.5 MG tablet TAKE 1 TABLET BY MOUTH TWICE A DAY Patient taking differently: Take 12.5 mg by mouth 2 (two) times daily with a meal.  02/26/19  Yes Josue Hector, MD  citalopram (CELEXA) 40 MG tablet Take 40 mg by mouth daily.   Yes [provider]  clobetasol cream (TEMOVATE) 2.42 % Apply 1 application topically 2 (two) times daily as needed (paronychia).  04/08/19  Yes [provider]  ENTRESTO 49-51 MG TAKE 1 TABLET BY MOUTH TWICE A DAY Patient taking differently: Take 1 tablet by mouth 2 (two) times daily.  02/26/19  Yes Josue Hector, MD  esomeprazole (NEXIUM) 20 MG capsule Take 40 mg by mouth daily.    Yes [provider]  folic acid (FOLVITE) 1 MG  tablet Take 1 mg by mouth daily.    Yes [provider]  gabapentin (NEURONTIN) 400 MG capsule Take 400 mg by mouth at bedtime as needed (pain).  04/08/19  Yes [provider]  meloxicam (MOBIC) 15 MG tablet Take 15 mg by mouth daily.  04/08/19  Yes [provider]  metFORMIN (GLUCOPHAGE-XR) 500 MG 24 hr tablet Take 500 mg by mouth daily with breakfast.  10/03/17  Yes [provider]  methotrexate (50 MG/ML) 1 g injection Inject into the vein once a week.    Yes [provider]  Multiple Vitamin (MULTIVITAMIN) tablet Take 2 tablets by mouth daily.    Yes [provider]  mycophenolate (CELLCEPT) 500 MG tablet Take 1,500 mg by mouth 2 (two) times daily.  06/05/17  Yes [provider]  polyethylene glycol (MIRALAX / GLYCOLAX) packet Take 17 g by mouth daily as needed for moderate constipation (constipation).    Yes [provider]  dextromethorphan (DELSYM) 30 MG/5ML liquid Take 15 mg by mouth 2 (two) times daily as needed for cough.     [provider]  Fluticasone-Salmeterol (ADVAIR) 100-50 MCG/DOSE AEPB Inhale 1 puff into the lungs 2 (two) times daily as needed (asthma).     [provider]  oxyCODONE (OXY IR/ROXICODONE) 5 MG immediate release tablet Take 5 mg by mouth every 6 (six) hours as needed for pain. 05/28/19   [provider]  phentermine (ADIPEX-P) 37.5 MG tablet Take 37.5 mg by mouth daily. 04/06/18   [provider]  progesterone (PROMETRIUM) 200 MG capsule Take 200 mg by mouth at bedtime. 05/28/19   [provider]     Vital Signs: BP 133/65   Pulse 84   Temp 98.8 F (37.1 C) (Oral)   Resp (!) 23   Ht 5\' 8"  (1.727 m)   Wt (!) 180 kg   SpO2 91%   BMI 60.34 kg/m   Physical Exam Vitals reviewed.  Constitutional:      Appearance: She is obese.  Cardiovascular:     Rate and Rhythm: Normal rate.  Pulmonary:     Effort: Pulmonary effort is normal. No respiratory  distress.  Abdominal:     Palpations: Abdomen is soft.     Comments: Right abdomen drain in place. Not much output. Flushes easily. There was a clot at the end of the tubing, likely reason for no output. Changed to bulb.  Neurological:     General: No focal deficit present.     Mental Status: She is alert and oriented to person, place, and time.  Psychiatric:        Mood and Affect: Mood normal.        Behavior: Behavior normal.        Thought Content: Thought content normal.        Judgment: Judgment normal.     Imaging: IR Catheter Tube Change  Result Date: 06/24/2019 INDICATION: Presumed diverticular abscess, status post 12 French pigtail drain catheter placement 06/13/2019. Follow-up CT shows continued enlargement of the collection. Drain revision is requested. EXAM: EXCHANGE/UPSIZING PELVIC DRAIN CATHETER UNDER FLUOROSCOPIC GUIDANCE MEDICATIONS: The patient is currently admitted to the hospital and receiving intravenous antibiotics. The antibiotics were administered within an appropriate time frame prior to the initiation of the procedure. ANESTHESIA/SEDATION: Intravenous Fentanyl 81mcg and Versed 1mg  were administered as conscious sedation during continuous monitoring of the patient's level of consciousness and physiological / cardiorespiratory status by the radiology RN, with a total moderate sedation time of 10 minutes. COMPLICATIONS: None immediate. PROCEDURE: Informed written consent was obtained from the patient after a thorough discussion of the procedural risks, benefits and alternatives. All questions were addressed. Maximal Sterile Barrier Technique was utilized including caps, mask, sterile gowns, sterile gloves, sterile drape, hand hygiene and skin antiseptic. A timeout was performed prior to the initiation of the procedure. Skin surrounding the previously placed drain was infiltrated with 1% lidocaine. Small contrast injection opacified the right lower quadrant collection freely.  The previously placed catheter was cut and exchanged over a short Amplatz wire for a 16 French pigtail drain catheter, formed centrally within the collection. Contrast injection confirmed appropriate positioning and patency although the pigtail would not fully lock. The catheter was secured externally with 0 Prolene suture and StatLock and placed to gravity drain bag. The patient tolerated the procedure well. IMPRESSION: 1. Technically successful exchange and upsizing of right lower quadrant drain to a 16 [place] device. Electronically Signed   By: Lucrezia Europe M.D.   On: 06/24/2019 16:24    Labs:  CBC: Recent Labs    06/19/19 0446 06/21/19 0406 06/23/19 0255 06/26/19 0414  WBC 14.3* 15.2* 14.6* 11.7*  HGB 9.7* 9.4* 9.8* 9.1*  HCT 31.8* 31.5* 31.9* 30.4*  PLT 306 271 247 231    COAGS: Recent Labs    06/13/19 1015  INR 1.3*    BMP: Recent Labs    06/22/19 0410 06/23/19  0255 06/24/19 0404 06/25/19 0506  NA 139 141 140 140  K 3.4* 3.4* 3.5 3.5  CL 101 104 105 104  CO2 26 23 24 26   GLUCOSE 113* 113* 118* 103*  BUN 29* 27* 24* 18  CALCIUM 8.2* 8.1* 8.1* 8.0*  CREATININE 4.43* 3.81* 2.83* 1.99*  GFRNONAA 10* 12* 18* 27*  GFRAA 12* 14* 20* 31*    LIVER FUNCTION TESTS: Recent Labs    06/12/19 0209 06/12/19 0209 06/13/19 0321 06/14/19 0121 06/17/19 0221 06/17/19 0221 06/18/19 0446 06/20/19 1104 06/21/19 0406 06/22/19 0410 06/23/19 0255 06/24/19 0404  BILITOT 1.4*  --  1.1  --  0.3  --  0.5  --   --   --   --   --   AST 14*  --  21  --  29  --  38  --   --   --   --   --   ALT 12  --  15  --  22  --  22  --   --   --   --   --   ALKPHOS 89  --  94  --  100  --  102  --   --   --   --   --   PROT 5.9*  --  6.4*  --  5.8*  --  5.7*  --   --   --   --   --   ALBUMIN 2.9*   < > 2.9*   < > 2.4*   < > 2.2*   < > 2.2* 2.2* 2.4* 2.4*   < > = values in this interval not displayed.    Assessment and Plan:  S/P drain placement by Dr. Vernard Gambles to treat the right pelvic  fluid collection on 06/13/19.   Follow up CT on 06/20/19 showed a shows a larger collection.  Drain upsize on 06/24/19 by Dr. Vernard Gambles.  Her drain has had little output over the past couple of days.   I removed the gravity bag, there was clot at the end of the tubing, likely reason for no output.  Flushed easily. Placed bulb suction to drain.  Continue routine drain care.  Output follow up order are in place for IR [facility].  Electronically Signed: Murrell Redden, PA-C 06/26/2019, 11:29 AM    I spent a total of 15 Minutes at the the patient's bedside AND on the patient's hospital floor or unit, greater than 50% of which was counseling/coordinating care for f/u divertic drain.

## 2019-06-26 NOTE — Progress Notes (Signed)
Nutrition Follow-up  DOCUMENTATION CODES:   Morbid obesity  INTERVENTION:  Continue Nepro Shake po BID, each supplement provides 425 kcal and 19 grams protein.  Encourage adequate PO intake.   NUTRITION DIAGNOSIS:   Increased nutrient needs related to acute illness (AKI) as evidenced by estimated needs; onging  GOAL:   Patient will meet greater than or equal to 90% of their needs; progressing  MONITOR:   PO intake, Supplement acceptance, Skin, Weight trends, Labs, I & O's, Diet advancement  REASON FOR ASSESSMENT:   Consult Assessment of nutrition requirement/status  ASSESSMENT:   59 y.o. 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 CT abd/pelvis was performed, which was notable for sigmoid diverticulitis. S/p perc drain to RLQ on 6/4 for diverticular abscess. RT IJ temp HD cath placed 6/7. Exchange and upsize RLQ drain catheter 6/15.  Last HD treatment 6/11. Per MD, if renal lab panel continues to improve, will discontinue line. Meal completion has been 50-100%. Pt currently has Nepro shake and has been consuming them. RD to continue with current orders. Per MD, pt to continue a low fiber diet for 4-6 weeks, then a high fiber diet to follow.   Labs and medications reviewed.   Diet Order:   Diet Order            DIET SOFT Room service appropriate? Yes; Fluid consistency: Thin  Diet effective now                 EDUCATION NEEDS:   Not appropriate for education at this time  Skin:  Skin Assessment: Reviewed RN Assessment  Last BM:  6/17  Height:   Ht Readings from Last 1 Encounters:  06/11/19 5\' 8"  (1.727 m)    Weight:   Wt Readings from Last 1 Encounters:  06/23/19 (!) 180 kg    BMI:  Body mass index is 60.34 kg/m.  Estimated Nutritional Needs:   Kcal:  2000-2300  Protein:   100-120 grams  Fluid:  >/= 2 L/day  Corrin Parker, MS, RD, LDN RD pager number/after hours weekend pager number on Amion.

## 2019-06-26 NOTE — Discharge Instructions (Signed)
Diverticulitis  Diverticulitis is when small pockets in your large intestine (colon) get infected or swollen. This causes stomach pain and watery poop (diarrhea). These pouches are called diverticula. They form in people who have a condition called diverticulosis. Follow these instructions at home: Medicines  Take over-the-counter and prescription medicines only as told by your doctor. These include: ? Antibiotics. ? Pain medicines. ? Fiber pills. ? Probiotics. ? Stool softeners.  Do not drive or use heavy machinery while taking prescription pain medicine.  If you were prescribed an antibiotic, take it as told. Do not stop taking it even if you feel better. General instructions   Follow a diet as told by your doctor.  When you feel better, your doctor may tell you to change your diet. You may need to eat a lot of fiber. Fiber makes it easier to poop (have bowel movements). Healthy foods with fiber include: ? Berries. ? Beans. ? Lentils. ? Green vegetables.  Exercise 3 or more times a week. Aim for 30 minutes each time. Exercise enough to sweat and make your heart beat faster.  Keep all follow-up visits as told. This is important. You may need to have an exam of the large intestine. This is called a colonoscopy. Contact a doctor if:  Your pain does not get better.  You have a hard time eating or drinking.  You are not pooping like normal. Get help right away if:  Your pain gets worse.  Your problems do not get better.  Your problems get worse very fast.  You have a fever.  You throw up (vomit) more than one time.  You have poop that is: ? Bloody. ? Black. ? Tarry. Summary  Diverticulitis is when small pockets in your large intestine (colon) get infected or swollen.  Take medicines only as told by your doctor.  Follow a diet as told by your doctor. This information is not intended to replace advice given to you by your health care provider. Make sure you  discuss any questions you have with your health care provider. Document Revised: 12/08/2016 Document Reviewed: 01/13/2016 Elsevier Patient Education  [place] for 4-6 weeks, then transition to high fiber diet Fiber is found in fruits, vegetables, whole grains, and beans. Eating a diet low in fiber helps to reduce how often you have bowel movements and how much you produce during a bowel movement. A low-fiber eating plan may help your digestive system heal if:  You have certain conditions, such as Crohn's disease or diverticulitis.  You recently had radiation therapy on your pelvis or bowel.  You recently had intestinal surgery.  You have a new surgical opening in your abdomen (colostomy or ileostomy).  Your intestine is narrowed (stricture). Your health care provider will determine how long you need to stay on this diet. Your health care provider may recommend that you work with a diet and nutrition specialist (dietitian). What are tips for following this plan? General guidelines  Follow recommendations from your dietitian about how much fiber you should have each day.  Most people on this eating plan should try to eat less than 10 grams (g) of fiber each day. Your daily fiber goal is _________________ g.  Take vitamin and mineral supplements as told by your health care provider or dietitian. Chewable or liquid forms are best when on this eating plan. Reading food labels  Check food labels for the amount of dietary fiber.  Choose foods that have  less than 2 grams of fiber in one serving. Cooking  Use white flour and other allowed grains for baking and cooking.  Cook meat using methods that keep it tender, such as braising or poaching.  Cook eggs until the yolk is completely solid.  Cook with healthy oils, such as olive oil or canola oil. Meal planning   Eat 5-6 small meals throughout the day instead of 3 large meals.  If you are lactose  intolerant: ? Choose low-lactose dairy foods. ? Do not eat dairy foods, if told by your dietitian.  Limit fat and oils to less than 8 teaspoons a day.  Eat small portions of desserts. What foods are allowed? The items listed below may not be a complete list. Talk with your dietitian about what dietary choices are best for you. Grains All bread and crackers made with white flour. Waffles, pancakes, and [place] toast. Bagels. Pretzels. Melba toast, zwieback, and matzoh. Cooked and dried cereals that do not contain whole grains, added fiber, seeds, or dried fruit. CornmealDomenick Gong. Hot and cold cereals made with refined corn, wheat, rice, or oats. Plain pasta and noodles. White rice. Vegetables Well-cooked or canned vegetables without skin, seeds, or stems. Cooked potatoes without skins. Vegetable juice. Fruits Soft-cooked or canned fruits without skin and seeds. Peeled ripe banana. Applesauce. Fruit juice without pulp. Meats and other protein foods Ground meat. Tender cuts of meat or poultry. Eggs. Fish, seafood, and shellfish. Smooth nut butters. Tofu. Dairy All milk products and drinks. Lactose-free milks, including rice, soy, and almond milks. Yogurt without fruit, nuts, chocolate, or granola mix-ins. Sour cream. Cottage cheese. Cheese. Beverages Decaf coffee. Fruit and vegetable juices or smoothies (in small amounts, with no pulp or skins, and with fruits from allowed list). Sports drinks. Herbal tea. Fats and oils Olive oil, canola oil, sunflower oil, flaxseed oil, and grapeseed oil. Mayonnaise. Cream cheese. Margarine. Butter. Sweets and desserts Plain cakes and cookies. Cream pies and pies made with allowed fruits. Pudding. Custard. Fruit gelatin. Sherbet. Popsicles. Ice cream without nuts. Plain hard candy. Honey. Jelly. Molasses. Syrups, including chocolate syrup. Chocolate. Marshmallows. Gumdrops. Seasoning and other foods Bouillon. Broth. Cream soups made from allowed foods.  Strained soup. Casseroles made with allowed foods. Ketchup. Mild mustard. Mild salad dressings. Plain gravies. Vinegar. Spices in moderation. Salt. Sugar. What foods are not allowed? The items listed below may not be a complete list. Talk with your dietitian about what dietary choices are best for you. Grains Whole wheat and whole grain breads and crackers. Multigrain breads and crackers. Rye bread. Whole grain or multigrain cereals. Cereals with nuts, raisins, or coconut. Bran. Coarse wheat cereals. Granola. High-fiber cereals. Cornmeal or corn bread. Whole grain pasta. Wild or brown rice. Quinoa. Popcorn. Buckwheat. Wheat germ. Vegetables Potato skins. Raw or undercooked vegetables. All beans and bean sprouts. Cooked greens. Corn. Peas. Cabbage. Beets. Broccoli. Brussels sprouts. Cauliflower. Mushrooms. Onions. Peppers. Parsnips. Okra. Sauerkraut. Fruit Raw or dried fruit. Berries. Fruit juice with pulp. Prune juice. Meats and other protein foods Tough, fibrous meats with gristle. Fatty meat. Poultry with skin. Fried meat, Sales executive, or fish. Deli or lunch meats. Sausage, bacon, and hot dogs. Nuts and chunky nut butter. Dried peas, beans, and lentils. Dairy Yogurt with fruit, nuts, chocolate, or granola mix-ins. Beverages Caffeinated coffee and teas. Fats and oils Avocado. Coconut. Sweets and desserts Desserts, cookies, or candies that contain nuts or coconut. Dried fruit. Jams and preserves with seeds. Marmalade. Any dessert made with fruits or grains that are  not allowed. Seasoning and other foods Corn tortilla chips. Soups made with vegetables or grains that are not allowed. Relish. Horseradish. Angie Fava. Olives. Summary  Most people on a low-fiber eating plan should eat less than 10 grams of fiber a day. Follow recommendations from your dietitian about how much fiber you should have each day.  Always check food labels to see the dietary fiber content of packaged foods. In general, a  low-fiber food will have fewer than 2 grams of fiber per serving.  In general, try to avoid whole grains, raw fruits and vegetables, dried fruit, tough cuts of meat, nuts, and seeds.  Take a vitamin and mineral supplement as told by your health care provider or dietitian. This information is not intended to replace advice given to you by your health care provider. Make sure you discuss any questions you have with your health care provider. Document Revised: 04/19/2018 Document Reviewed: 02/29/2016 Elsevier Patient Education  [place].   High-Fiber Diet Fiber, also called dietary fiber, is a type of carbohydrate that is found in fruits, vegetables, whole grains, and beans. A high-fiber diet can have many health benefits. Your health care provider may recommend a high-fiber diet to help:  Prevent constipation. Fiber can make your bowel movements more regular.  Lower your cholesterol.  Relieve the following conditions: ? Swelling of veins in the anus (hemorrhoids). ? Swelling and irritation (inflammation) of specific areas of the digestive tract (uncomplicated diverticulosis). ? A problem of the large intestine (colon) that sometimes causes pain and diarrhea (irritable bowel syndrome, IBS).  Prevent overeating as part of a weight-loss plan.  Prevent heart disease, type 2 diabetes, and certain cancers. What is my plan? The recommended daily fiber intake in grams (g) includes:  38 g for men age 50 or younger.  30 g for men over age 11.  37 g for women age 58 or younger.  21 g for women over age 25. You can get the recommended daily intake of dietary fiber by:  Eating a variety of fruits, vegetables, grains, and beans.  Taking a fiber supplement, if it is not possible to get enough fiber through your diet. What do I need to know about a high-fiber diet?  It is better to get fiber through food sources rather than from fiber supplements. There is not a lot of research  about how effective supplements are.  Always check the fiber content on the nutrition facts label of any prepackaged food. Look for foods that contain 5 g of fiber or more per serving.  Talk with a diet and nutrition specialist (dietitian) if you have questions about specific foods that are recommended or not recommended for your medical condition, especially if those foods are not listed below.  Gradually increase how much fiber you consume. If you increase your intake of dietary fiber too quickly, you may have bloating, cramping, or gas.  Drink plenty of water. Water helps you to digest fiber. What are tips for following this plan?  Eat a wide variety of high-fiber foods.  Make sure that half of the grains that you eat each day are whole grains.  Eat breads and cereals that are made with whole-grain flour instead of refined flour or white flour.  Eat brown rice, bulgur wheat, or millet instead of white rice.  Start the day with a breakfast that is high in fiber, such as a cereal that contains 5 g of fiber or more per serving.  Use beans in place  of meat in soups, salads, and pasta dishes.  Eat high-fiber snacks, such as berries, raw vegetables, nuts, and popcorn.  Choose whole fruits and vegetables instead of processed forms like juice or sauce. What foods can I eat?  Fruits Berries. Pears. Apples. Oranges. Avocado. Prunes and raisins. Dried figs. Vegetables Sweet potatoes. Spinach. Kale. Artichokes. Cabbage. Broccoli. Cauliflower. Green peas. Carrots. Squash. Grains Whole-grain breads. Multigrain cereal. Oats and oatmeal. Brown rice. Barley. Bulgur wheat. [place]. Quinoa. Bran muffins. Popcorn. Rye wafer crackers. Meats and other proteins Navy, kidney, and pinto beans. Soybeans. Split peas. Lentils. Nuts and seeds. Dairy Fiber-fortified yogurt. Beverages Fiber-fortified soy milk. Fiber-fortified orange juice. Other foods Fiber bars. The items listed above may not be a  complete list of recommended foods and beverages. Contact a dietitian for more options. What foods are not recommended? Fruits Fruit juice. Cooked, strained fruit. Vegetables Fried potatoes. Canned vegetables. Well-cooked vegetables. Grains White bread. Pasta made with refined flour. White rice. Meats and other proteins Fatty cuts of meat. Fried chicken or fried fish. Dairy Milk. Yogurt. Cream cheese. Sour cream. Fats and oils Butters. Beverages Soft drinks. Other foods Cakes and pastries. The items listed above may not be a complete list of foods and beverages to avoid. Contact a dietitian for more information. Summary  Fiber is a type of carbohydrate. It is found in fruits, vegetables, whole grains, and beans.  There are many health benefits of eating a high-fiber diet, such as preventing constipation, lowering blood cholesterol, helping with weight loss, and reducing your risk of heart disease, diabetes, and certain cancers.  Gradually increase your intake of fiber. Increasing too fast can result in cramping, bloating, and gas. Drink plenty of water while you increase your fiber.  The best sources of fiber include whole fruits and vegetables, whole grains, nuts, seeds, and beans. This information is not intended to replace advice given to you by your health care provider. Make sure you discuss any questions you have with your health care provider. Document Revised: 10/30/2016 Document Reviewed: 10/30/2016 Elsevier Patient Education  2020 [employer].

## 2019-06-26 NOTE — Progress Notes (Cosign Needed)
RE: [name] Date of Birth: 1960-10-16 Date: 06/26/19  Please be advised that the above-named patient will require a short-term nursing home stay - anticipated 30 days or less for rehabilitation and strengthening.  The plan is for return home.

## 2019-06-26 NOTE — Progress Notes (Signed)
Discussed with IR regarding changing her gravity bag to bulb suction to help evacuate infection.  They will address if they agree.  Follow up being arranged with Dr. Kae Heller in our office in 3 weeks.  I have placed information regarding her diet recommendations in her DC instruction section.  She will be on a low fiber diet for 4-6 weeks, then switch to a high fiber diet to follow.  Henreitta Cea 10:48 AM 06/26/2019

## 2019-06-26 NOTE — TOC Initial Note (Signed)
Transition of Care [facility]) - Initial/Assessment Note    Patient Details  Name: [name] MRN: [id number] Date of Birth: 1960-09-05  Transition of Care [facility]) CM/SW Contact:    Benard Halsted, LCSW Phone Number: 06/26/2019, 3:33 PM  Clinical Narrative:                 CSW spoke with patient regarding change in PT recommendation to SNF. Patient reported that she has been considering SNF rather than home health due to being afraid that she might not can get up by herself at home. Her husband is a Administrator and she was planning on staying with her sister at discharge, but would like to see what SNF options are available. CSW discussed insurance authorization process (and potential out of pocket copay) and will provide Medicare SNF ratings list. Patient expressed being hopeful for rehab and to feel better soon. She does not want [facility] as she has been there before. She has received both COVID vaccines and will be able to have visitation at the SNF. No further questions reported at this time. CSW to continue to follow and assist with discharge planning needs. Her pasrr is currently pending.    Expected Discharge Plan: Skilled Nursing Facility Barriers to Discharge: Ship broker, Continued Medical Work up, SNF Pending bed offer   Patient Goals and CMS Choice Patient states their goals for this hospitalization and ongoing recovery are:: Rehab CMS Medicare.gov Compare Post Acute Care list provided to:: Patient Choice offered to / list presented to : Patient  Expected Discharge Plan and Services Expected Discharge Plan: [place] In-house Referral: Clinical Social Work   Post Acute Care Choice: [place] Living arrangements for the past 2 months: [place]                                      Prior Living Arrangements/Services Living arrangements for the past 2 months: Single Family Home Lives with:: Spouse Patient language  and need for interpreter reviewed:: Yes Do you feel safe going back to the place where you live?: Yes      Need for Family Participation in Patient Care: Yes (Comment) Care giver support system in place?: Yes (comment) Current home services: DME Criminal Activity/Legal Involvement Pertinent to Current Situation/Hospitalization: No - Comment as needed  Activities of Daily Living Home Assistive Devices/Equipment: Cane (specify quad or straight), Eyeglasses ADL Screening (condition at time of admission) Patient's cognitive ability adequate to safely complete daily activities?: Yes Is the patient deaf or have difficulty hearing?: No Does the patient have difficulty seeing, even when wearing glasses/contacts?: No Does the patient have difficulty concentrating, remembering, or making decisions?: No Patient able to express need for assistance with ADLs?: Yes Does the patient have difficulty dressing or bathing?: Yes Independently performs ADLs?: Yes (appropriate for developmental age) Does the patient have difficulty walking or climbing stairs?: Yes Weakness of Legs: None Weakness of Arms/Hands: None  Permission Sought/Granted Permission sought to share information with : Facility Sport and exercise psychologist, Family Supports Permission granted to share information with : Yes, Verbal Permission Granted     Permission granted to share info w AGENCY: SNFs        Emotional Assessment Appearance:: Appears older than stated age Attitude/Demeanor/Rapport: Gracious Affect (typically observed): Accepting, Appropriate Orientation: : Oriented to Self, Oriented to Place, Oriented to  Time, Oriented to Situation Alcohol / Substance Use:  Not Applicable Psych Involvement: No (comment)  Admission diagnosis:  Sigmoid diverticulitis [K57.32] Acute diverticulitis [K57.92] Patient Active Problem List   Diagnosis Date Noted   Bacteremia due to bacteroides species 06/25/2019   Candidal intertrigo  06/25/2019   Pressure injury of skin 06/23/2019   Colonic diverticular abscess 06/23/2019   Acute kidney injury (AKI) with acute tubular necrosis (ATN) (HCC) 06/14/2019   Acute diverticulitis 06/11/2019   Preoperative clearance 05/23/2019   Hypertension 12/25/2017   Nonintractable headache 11/29/2017   Cardiac LV ejection fraction of 35-39% 11/26/2017   Medication management 11/26/2017   Chronic low back pain 10/16/2017   Pseudotumor cerebri 10/15/2017   Chronic right-sided low back pain with right-sided sciatica 12/26/2016   Paresthesia 12/26/2016   Right leg swelling 12/26/2016   Physical deconditioning 04/30/2015   Dyspnea 05/11/2014   Leukocytosis 05/11/2014   Chronic fatigue 05/11/2014   Morbid obesity ([place]) 01/22/2014   ILD (interstitial lung disease) ([place]) 01/22/2014   Acute bronchitis 01/08/2014   Chronic respiratory failure with hypoxia ([place]) 11/04/2013   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   Microscopic polyangiitis ([place]) 08/08/2013   Hyperglycemia 07/24/2013   GERD (gastroesophageal reflux disease) 07/24/2013   Hypokalemia 07/20/2013   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[place]) 06/24/2013   Hemoptysis 06/23/2013   Acute blood loss anemia 06/23/2013   Respiratory failure with hypoxia ([place]) 06/23/2013   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 [place] [address] Phone: [phone] Fax:  [phone]     Social Determinants of Health (SDOH) Interventions    Readmission Risk Interventions No flowsheet data found.

## 2019-06-26 NOTE — Final Consult Note (Signed)
Consultant Final Sign-Off Note    Assessment/Final recommendations  [name] is a 59 y.o. female followed by me for perforated diverticulitis   Wound care (if applicable):    Diet at discharge: per primary team   Activity at discharge: per primary team   Follow-up appointment:     Pending results:  Unresulted Labs (From admission, onward) Comment          Start     Ordered   06/27/19 0500  Renal function panel  Daily,   R     Question:  Specimen collection method  Answer:  IV Team=IV Team collect   06/26/19 0743   06/27/19 0500  Renal function panel  Daily,   R     Question:  Specimen collection method  Answer:  IV Team=IV Team collect   06/26/19 0743   06/26/19 0744  Renal function panel  Once,   R       Comments: Draw at HD   Question:  Specimen collection method  Answer:  IV Team=IV Team collect   06/26/19 0743   06/18/19 0500  Creatinine, serum  (enoxaparin (LOVENOX)    CrCl >/= 30 ml/min)  Weekly,   R     Comments: while on enoxaparin therapy    06/11/19 1845   Signed and Held  Renal function panel  Once,   R       Question:  Specimen collection method  Answer:  Lab=Lab collect   Signed and Held   Signed and Held  CBC  Once,   R       Question:  Specimen collection method  Answer:  Lab=Lab collect   Signed and Held   Signed and Held  Renal function panel  Once,   R       Question:  Specimen collection method  Answer:  Lab=Lab collect   Signed and Held   Signed and Held  CBC  Once,   R       Question:  Specimen collection method  Answer:  Lab=Lab collect   Signed and Held           Medication recommendations:   Other recommendations:    Thank you for allowing [place] to participate in the care of your patient!  Please consult [place] again if you have further needs for your patient.  Zenovia Jarred 06/26/2019 10:44 AM    Subjective     Objective  Vital signs in last 24 hours: Temp:  [98.3 F (36.8 C)-98.8 F (37.1 C)] 98.8 F (37.1 C) (06/17  0400) Pulse Rate:  [76-86] 84 (06/17 0953) Resp:  [18-23] 23 (06/17 0400) BP: (88-147)/(65-79) 133/65 (06/17 0953) SpO2:  [91 %-94 %] 91 % (06/17 0400)  General: Abd much less tender Drain working F/U with CCS and IR drain clinic at D/C   Pertinent labs and Studies: Recent Labs    06/26/19 0414  WBC 11.7*  HGB 9.1*  HCT 30.4*   BMET Recent Labs    06/24/19 0404 06/25/19 0506  NA 140 140  K 3.5 3.5  CL 105 104  CO2 24 26  GLUCOSE 118* 103*  BUN 24* 18  CREATININE 2.83* 1.99*  CALCIUM 8.1* 8.0*   No results for input(s): LABURIN in the last 72 hours. Results for orders placed or performed during the hospital encounter of 06/11/19  SARS Coronavirus 2 by RT PCR (hospital order, performed in [facility] hospital lab) Nasopharyngeal Nasopharyngeal Swab     Status: None  Collection Time: 06/11/19  6:16 PM   Specimen: Nasopharyngeal Swab  Result Value Ref Range Status   SARS Coronavirus 2 NEGATIVE NEGATIVE Final    Comment: (NOTE) SARS-CoV-2 target nucleic acids are NOT DETECTED. The SARS-CoV-2 RNA is generally detectable in upper and lower respiratory specimens during the acute phase of infection. The lowest concentration of SARS-CoV-2 viral copies this assay can detect is 250 copies / mL. A negative result does not preclude SARS-CoV-2 infection and should not be used as the sole basis for treatment or other patient management decisions.  A negative result may occur with improper specimen collection / handling, submission of specimen other than nasopharyngeal swab, presence of viral mutation(s) within the areas targeted by this assay, and inadequate number of viral copies (<250 copies / mL). A negative result must be combined with clinical observations, patient history, and epidemiological information. Fact Sheet for Patients:   StrictlyIdeas.no Fact Sheet for Healthcare Providers: BankingDealers.co.za This test is not  yet approved or cleared  by the [place] FDA and has been authorized for detection and/or diagnosis of SARS-CoV-2 by FDA under an Emergency Use Authorization (EUA).  This EUA will remain in effect (meaning this test can be used) for the duration of the COVID-19 declaration under Section 564(b)(1) of the Act, 21 U.S.C. section 360bbb-3(b)(1), unless the authorization is terminated or revoked sooner. Performed at [facility], [place] [address], [place], [place] [address]   Culture, blood (Routine X 2) w Reflex to ID Panel     Status: Abnormal   Collection Time: 06/11/19  8:20 PM   Specimen: BLOOD  Result Value Ref Range Status   Specimen Description BLOOD BLOOD LEFT FOREARM  Final   Special Requests   Final    BOTTLES DRAWN AEROBIC AND ANAEROBIC Blood Culture results may not be optimal due to an inadequate volume of blood received in culture bottles   Culture  Setup Time (A)  Final    GRAM VARIABLE ROD ANAEROBIC BOTTLE ONLY CRITICAL RESULT CALLED TO, READ BACK BY AND VERIFIED WITH: PHARMD J FRENS AT 0815 06/16/19 BY L B[place]    Culture (A)  Final    BACTEROIDES SPECIES BETA LACTAMASE NEGATIVE Performed at [facility], [place] [address], [place], [place] [address]    Report Status 06/20/2019 FINAL  Final  Blood Culture ID Panel (Reflexed)     Status: None   Collection Time: 06/11/19  8:20 PM  Result Value Ref Range Status   Enterococcus species NOT DETECTED NOT DETECTED Final   Listeria monocytogenes NOT DETECTED NOT DETECTED Final   Staphylococcus species NOT DETECTED NOT DETECTED Final   Staphylococcus aureus (BCID) NOT DETECTED NOT DETECTED Final   Streptococcus species NOT DETECTED NOT DETECTED Final   Streptococcus agalactiae NOT DETECTED NOT DETECTED Final   Streptococcus pneumoniae NOT DETECTED NOT DETECTED Final   Streptococcus pyogenes NOT DETECTED NOT DETECTED Final   Acinetobacter baumannii NOT DETECTED NOT DETECTED Final   Enterobacteriaceae species NOT  DETECTED NOT DETECTED Final   Enterobacter cloacae complex NOT DETECTED NOT DETECTED Final   Escherichia coli NOT DETECTED NOT DETECTED Final   Klebsiella oxytoca NOT DETECTED NOT DETECTED Final   Klebsiella pneumoniae NOT DETECTED NOT DETECTED Final   Proteus species NOT DETECTED NOT DETECTED Final   Serratia marcescens NOT DETECTED NOT DETECTED Final   Haemophilus influenzae NOT DETECTED NOT DETECTED Final   Neisseria meningitidis NOT DETECTED NOT DETECTED Final   Pseudomonas aeruginosa NOT DETECTED NOT DETECTED Final  Candida albicans NOT DETECTED NOT DETECTED Final   Candida glabrata NOT DETECTED NOT DETECTED Final   Candida krusei NOT DETECTED NOT DETECTED Final   Candida parapsilosis NOT DETECTED NOT DETECTED Final   Candida tropicalis NOT DETECTED NOT DETECTED Final    Comment: Performed at [facility], [place] [address] [place], [place] [address]  Culture, blood (Routine X 2) w Reflex to ID Panel     Status: None   Collection Time: 06/11/19 10:00 PM   Specimen: BLOOD  Result Value Ref Range Status   Specimen Description BLOOD RIGHT HAND  Final   Special Requests   Final    BOTTLES DRAWN AEROBIC ONLY Blood Culture results may not be optimal due to an inadequate volume of blood received in culture bottles   Culture   Final    NO GROWTH 5 DAYS Performed at [facility], [place] [address], [place], [place] [address]    Report Status 06/16/2019 FINAL  Final  MRSA PCR Screening     Status: None   Collection Time: 06/15/19  9:26 AM   Specimen: Nasopharyngeal  Result Value Ref Range Status   MRSA by PCR NEGATIVE NEGATIVE Final    Comment:        The GeneXpert MRSA Assay (FDA approved for NASAL specimens only), is one component of a comprehensive MRSA colonization surveillance program. It is not intended to diagnose MRSA infection nor to guide or monitor treatment for MRSA infections. Performed at [facility], [place] [address] [place],  [address]      Imaging: No results found.

## 2019-06-27 LAB — RENAL FUNCTION PANEL
Albumin: 2.4 g/dL — ABNORMAL LOW (ref 3.5–5.0)
Anion gap: 11 (ref 5–15)
BUN: 15 mg/dL (ref 6–20)
CO2: 25 mmol/L (ref 22–32)
Calcium: 7.9 mg/dL — ABNORMAL LOW (ref 8.9–10.3)
Chloride: 103 mmol/L (ref 98–111)
Creatinine, Ser: 1.41 mg/dL — ABNORMAL HIGH (ref 0.44–1.00)
GFR calc Af Amer: 47 mL/min — ABNORMAL LOW (ref >60)
GFR calc non Af Amer: 41 mL/min — ABNORMAL LOW (ref >60)
Glucose, Bld: 103 mg/dL — ABNORMAL HIGH (ref 70–99)
Phosphorus: 3.8 mg/dL (ref 2.5–4.6)
Potassium: 3.3 mmol/L — ABNORMAL LOW (ref 3.5–5.1)
Sodium: 139 mmol/L (ref 135–145)

## 2019-06-27 LAB — GLUCOSE, CAPILLARY
Glucose-Capillary: 102 mg/dL — ABNORMAL HIGH (ref 70–99)
Glucose-Capillary: 104 mg/dL — ABNORMAL HIGH (ref 70–99)
Glucose-Capillary: 119 mg/dL — ABNORMAL HIGH (ref 70–99)
Glucose-Capillary: 121 mg/dL — ABNORMAL HIGH (ref 70–99)
Glucose-Capillary: 131 mg/dL — ABNORMAL HIGH (ref 70–99)
Glucose-Capillary: 134 mg/dL — ABNORMAL HIGH (ref 70–99)
Glucose-Capillary: 148 mg/dL — ABNORMAL HIGH (ref 70–99)
Glucose-Capillary: 187 mg/dL — ABNORMAL HIGH (ref 70–99)

## 2019-06-27 MED ORDER — POTASSIUM CHLORIDE CRYS ER 20 MEQ PO TBCR
40.0000 meq | EXTENDED_RELEASE_TABLET | Freq: Two times a day (BID) | ORAL | Status: AC
  Administered 2019-06-27 (×2): 40 meq via ORAL
  Filled 2019-06-27 (×2): qty 2

## 2019-06-27 NOTE — TOC Progression Note (Signed)
Transition of Care [facility] - Progression Note    Patient Details  Name: [name] MRN: [id number] Date of Birth: [birth year]  Transition of Care [facility] CM/SW [place], LCSW Phone Number: 06/27/2019, 8:55 AM  Clinical Narrative:    No SNF bed offers yet.    Expected Discharge Plan: Skilled Nursing Facility Barriers to Discharge: Ship broker, Continued Medical Work up, SNF Pending bed offer  Expected Discharge Plan and Services Expected Discharge Plan: [place] In-house Referral: Clinical Social Work   Post Acute Care Choice: [place] Living arrangements for the past 2 months: Single Family Home                                       Social Determinants of Health (SDOH) Interventions    Readmission Risk Interventions No flowsheet data found.

## 2019-06-27 NOTE — TOC Progression Note (Signed)
Transition of Care [facility]) - Progression Note    Patient Details  Name: [name] MRN: [id number] Date of Birth: [birth year]  Transition of Care [facility]) CM/SW [place], LCSW Phone Number: 06/27/2019, 5:09 PM  Clinical Narrative:    CSW presented four bed offers to the patient. She has selected [facility]. [place] will begin insurance authorization (likely will not receive until Monday).   Expected Discharge Plan: Skilled Nursing Facility Barriers to Discharge: Ship broker, Continued Medical Work up, SNF Pending bed offer  Expected Discharge Plan and Services Expected Discharge Plan: [place] In-house Referral: Clinical Social Work   Post Acute Care Choice: [place] Living arrangements for the past 2 months: Single Family Home                                       Social Determinants of Health (SDOH) Interventions    Readmission Risk Interventions No flowsheet data found.

## 2019-06-27 NOTE — Progress Notes (Signed)
OT Cancellation Note  Patient Details Name: [name] MRN: [id number] DOB: [birth year]   Cancelled Treatment:    Reason Eval/Treat Not Completed: Patient declined, no reason specified;Other (comment) pt declined OT session. Pt reports she really wants to go home as she helps take care of her mother and feels like she really needs to be home with her. Encouraged pt to participate in session to work towards going home with pt still continuing to decline. Pt with a lot of questions about why she needs to SNF vs HH. Provided education and encouraged pt to participate with therapy next session. Will continue to follow.  Lanier Clam., COTA/L Acute Rehabilitation Services [phone]   Ihor Gully 06/27/2019, 4:32 PM

## 2019-06-27 NOTE — Progress Notes (Signed)
[facility] PROGRESS NOTE    Progress Note  [name]  FAO:[id number] DOB: 10/05/1960 DOA: 06/11/2019 PCP: Christain Sacramento, MD     Brief Narrative:   [name] is an 59 y.o. female past medical history of prior sigmoid diverticulitis with perforation in 2014 history of ILD diabetes mellitus type 2 comes into the hospital for abdominal pain in the ED CT scan showed sigmoid diverticulitis she was started empirically on IV Zosyn, repeated CT showed drain in place with a fluid collection measuring 12 x 8 x 11 which is increased in size compared to previous.  General surgery on board and appreciate assistance, IR had to be reconsulted.  Assessment/Plan:   Sepsis due to acute diverticulitis IR was reconsulted and drain 16 French placement was done on 06/14/2019. Blood cultures show gram variable rods She needs to have more than 100 cc of purulent material. Follow-up with ID next month. She will follow up with the IR drain clinic as an outpatient. Patient is medically stable for discharge awaiting skilled nursing facility placement.  Orthostatic hypotension: Per physical therapy, she is currently not on any antihypertensive medication. Likely deconditioning orthostasis now resolved.  Acute kidney injury leading to ATN: In the setting of hypotension, contrast-induced nephropathy and Entresto. Nephrology was consulted he was started on hemodialysis due to uremic symptoms, his creatinine is slowly improving.  And close to baseline discontinue central line.  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  Controlled diabetes mellitus type 2: With a hemoglobin A1c of 7.0.  Continue long-acting insulin plus sliding scale.  Hypokalemia: Repleted orally now improved at 3.5. The patient metabolic panel in the morning.  Essential hypertension Entresto was stopped on admission as her blood  pressure started to improve and became elevated she was restarted back on her Coreg. Will need to stay off Entresto for at least 2 weeks.    DVT prophylaxis: lovenox Family Communication:none Status is: Inpatient  Remains inpatient appropriate because:Hemodynamically unstable   Dispo: The patient is from: Home              Anticipated d/c is to: Home              Anticipated d/c date is: 1 days              Patient currently is medically stable to d/c.  Code Status:     Code Status Orders  (From admission, onward)         Start     Ordered   06/11/19 1845  Full code  Continuous        06/11/19 1845        Code Status History    Date Active Date Inactive Code Status Order ID Comments User Context   06/03/2019 0624 06/03/2019 1507 Full Code [id number]  Everlene Farrier, MD Inpatient   06/23/2013 1958 07/22/2013 1426 Full Code [id number]  Caren Griffins, MD ED   09/23/2012 1836 10/05/2012 1516 Full Code [id number]  Jackolyn Confer, MD ED   Advance Care Planning Activity        IV Access:    Peripheral IV   Procedures and diagnostic studies:   No results found.   Medical Consultants:    None.  Anti-Infectives:   Rocephin and flagyl  Subjective:    [name] no new complaints this morning feels great.  Objective:    Vitals:  06/26/19 2000 06/27/19 0037 06/27/19 0353 06/27/19 0800  BP:      Pulse:      Resp:      Temp: 98.5 F (36.9 C) 99.4 F (37.4 C) 98.8 F (37.1 C) 99.1 F (37.3 C)  TempSrc:  Oral Oral Axillary  SpO2:      Weight:      Height:       SpO2: 91 % O2 Flow Rate (L/min): 2 L/min   Intake/Output Summary (Last 24 hours) at 06/27/2019 1113 Last data filed at 06/27/2019 0900 Gross per 24 hour  Intake 480 ml  Output 1510 ml  Net -1030 ml   Filed Weights   06/19/19 0720 06/19/19 1059 06/23/19 0359  Weight: (!) 180.1 kg (!) 176.6 kg (!) 180 kg    Exam: General exam: In no acute distress, morbidly obese Respiratory  system: Good air movement and clear to auscultation. Cardiovascular system: S1 & S2 heard, RRR. No JVD. Gastrointestinal system: Abdomen is nondistended, soft and nontender.  Extremities: No pedal edema. Skin: No rashes, lesions or ulcers  Data Reviewed:    Labs: Basic Metabolic Panel: Recent Labs  Lab 06/22/19 0410 06/22/19 0410 06/23/19 0255 06/23/19 0255 06/24/19 0404 06/24/19 0404 06/25/19 0506 06/25/19 0506 06/26/19 1433 06/27/19 0452  NA 139   < > 141  --  140  --  140  --  140  140 139  K 3.4*   < > 3.4*   < > 3.5   < > 3.5   < > 3.3*  3.4* 3.3*  CL 101   < > 104  --  105  --  104  --  106  105 103  CO2 26   < > 23  --  24  --  26  --  23  23 25   GLUCOSE 113*   < > 113*  --  118*  --  103*  --  143*  140* 103*  BUN 29*   < > 27*  --  24*  --  18  --  14  14 15   CREATININE 4.43*   < > 3.81*  --  2.83*  --  1.99*  --  1.47*  1.51* 1.41*  CALCIUM 8.2*   < > 8.1*  --  8.1*  --  8.0*  --  7.8*  7.8* 7.9*  PHOS 6.0*  --  5.2*  --  4.0  --   --   --  3.4 3.8   < > = values in this interval not displayed.   GFR Estimated Creatinine Clearance: 75.7 mL/min (A) (by C-G formula based on SCr of 1.41 mg/dL (H)). Liver Function Tests: Recent Labs  Lab 06/22/19 0410 06/23/19 0255 06/24/19 0404 06/26/19 1433 06/27/19 0452  ALBUMIN 2.2* 2.4* 2.4* 2.5* 2.4*   No results for input(s): LIPASE, AMYLASE in the last 168 hours. No results for input(s): AMMONIA in the last 168 hours. Coagulation profile No results for input(s): INR, PROTIME in the last 168 hours. COVID-19 Labs  No results for input(s): DDIMER, FERRITIN, LDH, CRP in the last 72 hours.  Lab Results  Component Value Date   SARSCOV2NAA NEGATIVE 06/11/2019   [place] NEGATIVE 05/30/2019    CBC: Recent Labs  Lab 06/21/19 0406 06/23/19 0255 06/26/19 0414  WBC 15.2* 14.6* 11.7*  HGB 9.4* 9.8* 9.1*  HCT 31.5* 31.9* 30.4*  MCV 86.8 86.0 87.6  PLT 271 247 231   Cardiac Enzymes: No results for  input(s): CKTOTAL, CKMB,  CKMBINDEX, TROPONINI in the last 168 hours. BNP (last 3 results) No results for input(s): PROBNP in the last 8760 hours. CBG: Recent Labs  Lab 06/26/19 1606 06/26/19 2034 06/27/19 0036 06/27/19 0349 06/27/19 0735  GLUCAP 149* 148* 131* 102* 104*   D-Dimer: No results for input(s): DDIMER in the last 72 hours. Hgb A1c: No results for input(s): HGBA1C in the last 72 hours. Lipid Profile: No results for input(s): CHOL, HDL, LDLCALC, TRIG, CHOLHDL, LDLDIRECT in the last 72 hours. Thyroid function studies: No results for input(s): TSH, T4TOTAL, T3FREE, THYROIDAB in the last 72 hours.  Invalid input(s): FREET3 Anemia work up: No results for input(s): VITAMINB12, FOLATE, FERRITIN, TIBC, IRON, RETICCTPCT in the last 72 hours. Sepsis Labs: Recent Labs  Lab 06/21/19 0406 06/23/19 0255 06/26/19 0414  WBC 15.2* 14.6* 11.7*   Microbiology No results found for this or any previous visit (from the past 240 hour(s)).   Medications:   . buPROPion  450 mg Oral Daily  . carvedilol  12.5 mg Oral BID WC  . Chlorhexidine Gluconate Cloth  6 each Topical Daily  . citalopram  40 mg Oral Daily  . enoxaparin (LOVENOX) injection  90 mg Subcutaneous Q24H  . feeding supplement (NEPRO CARB STEADY)  237 mL Oral BID BM  . insulin aspart  0-9 Units Subcutaneous Q4H  . metroNIDAZOLE  500 mg Oral Q8H  . nystatin   Topical BID  . pantoprazole  40 mg Oral Daily  . saccharomyces boulardii  250 mg Oral BID  . sodium chloride flush  10-40 mL Intracatheter Q12H  . sodium chloride flush  5 mL Intracatheter Q8H   Continuous Infusions: . cefTRIAXone (ROCEPHIN)  IV 2 g (06/26/19 1331)  . sodium chloride Stopped (06/12/19 0949)      LOS: 16 days   Charlynne Cousins  [facility]  06/27/2019, 11:13 AM

## 2019-06-27 NOTE — Progress Notes (Addendum)
Patient ID: [name], female   DOB: [birth year], 59 y.o.   MRN: [id number]         [facility]  Date of Admission:  06/11/2019   Total days of antibiotics 17         ASSESSMENT: Drain output remains an issue but she is clinically improved.  We will continue current antibiotics.  PLAN: 1. Continue ceftriaxone and metronidazole 2. Monitor drain output 3. Please call Dr. Lita Mains [phone]) for any infectious disease questions this weekend  Addendum: If she discharges this weekend I would change her regimen to oral levofloxacin (500 mg loading dose 250 mg daily) and oral metronidazole 500 mg 3 times daily.  Please give her a 3-week supply.  I will arrange follow-up in my clinic on 07/15/2019.  Principal Problem:   Colonic diverticular abscess Active Problems:   Acute diverticulitis   Obesity (BMI 30-39.9)   ILD (interstitial lung disease) ([place])   Hypertension   Acute kidney injury (AKI) with acute tubular necrosis (ATN) (HCC)   Pressure injury of skin   Bacteremia due to bacteroides species   Candidal intertrigo   Scheduled Meds:  buPROPion  450 mg Oral Daily   carvedilol  12.5 mg Oral BID WC   Chlorhexidine Gluconate Cloth  6 each Topical Daily   citalopram  40 mg Oral Daily   enoxaparin (LOVENOX) injection  90 mg Subcutaneous Q24H   feeding supplement (NEPRO CARB STEADY)  237 mL Oral BID BM   insulin aspart  0-9 Units Subcutaneous Q4H   metroNIDAZOLE  500 mg Oral Q8H   nystatin   Topical BID   pantoprazole  40 mg Oral Daily   saccharomyces boulardii  250 mg Oral BID   sodium chloride flush  10-40 mL Intracatheter Q12H   sodium chloride flush  5 mL Intracatheter Q8H   Continuous Infusions:  cefTRIAXone (ROCEPHIN)  IV 2 g (06/26/19 1331)   sodium chloride Stopped (06/12/19 0949)   PRN Meds:.acetaminophen **OR** acetaminophen, benzonatate, docusate sodium, gabapentin, HYDROmorphone (DILAUDID) injection, labetalol,  LORazepam, ondansetron **OR** ondansetron (ZOFRAN) IV, sodium chloride flush   SUBJECTIVE: She feels much better than she did upon admission.  She says she is considering going to a skilled nursing facility for rehab post discharge.  Review of Systems: Review of Systems  Constitutional: Negative for chills, diaphoresis and fever.  Gastrointestinal: Negative for abdominal pain, nausea and vomiting.    Allergies  Allergen Reactions   Guaifenesin Nausea And Vomiting and Other (See Comments)    un   Mucinex [Guaifenesin Er] Nausea And Vomiting   Sulfa Antibiotics Nausea And Vomiting    OBJECTIVE: Vitals:   06/26/19 2000 06/27/19 0037 06/27/19 0353 06/27/19 0800  BP:      Pulse:      Resp:      Temp: 98.5 F (36.9 C) 99.4 F (37.4 C) 98.8 F (37.1 C) 99.1 F (37.3 C)  TempSrc:  Oral Oral Axillary  SpO2:      Weight:      Height:       Body mass index is 60.34 kg/m.  Physical Exam Constitutional:      Comments: She is resting quietly in bed.  Abdominal:     Palpations: Abdomen is soft.     Tenderness: There is no abdominal tenderness.     Comments: 10 cc of drain output recorded yesterday.  This morning was noted on the end of the drain and removed.  She has purulent  fluid in the drain now.     Lab Results Lab Results  Component Value Date   WBC 11.7 (H) 06/26/2019   HGB 9.1 (L) 06/26/2019   HCT 30.4 (L) 06/26/2019   MCV 87.6 06/26/2019   PLT 231 06/26/2019    Lab Results  Component Value Date   CREATININE 1.41 (H) 06/27/2019   BUN 15 06/27/2019   NA 139 06/27/2019   K 3.3 (L) 06/27/2019   CL 103 06/27/2019   CO2 25 06/27/2019    Lab Results  Component Value Date   ALT 22 06/18/2019   AST 38 06/18/2019   ALKPHOS 102 06/18/2019   BILITOT 0.5 06/18/2019     Microbiology: No results found for this or any previous visit (from the past 240 hour(s)).  [name], MD [facility] [phone]  pager   [phone] cell 06/27/2019, 9:56 AM

## 2019-06-27 NOTE — Progress Notes (Signed)
Referring Physician(s): Margie Billet, PA-C  Supervising Physician: Aletta Edouard  Patient Status:  [name] - In-pt  Chief Complaint: Drain follow up  Brief History:   [name] is a 59y.o, femalewith history ofprior sigmoiddiverticulitiswith perforation 2014, DM, HTN. She presented to the emergency department with worsening RLQ pain and constipation. She was found to have leokocytosis, a RLQ fluid collection and AKI.  She underwent drain placement by Dr. Vernard Gambles to treat the right pelvic fluid collection on 06/13/19. Follow up CT on 06/20/19 showed a larger collection and she had a drain upsize on 06/24/19 by Dr. Vernard Gambles.  Her drain has had little output over the past couple of days. The gravity bag was exchanged for a bulb 06/26/2019.   Subjective: Patient is in bed, no apparent discomfort or distress. She states she hopes she is going home soon.   Allergies: Guaifenesin, Mucinex [guaifenesin er], and Sulfa antibiotics  Medications: Prior to Admission medications   Medication Sig Start Date End Date Taking? Authorizing Provider  ALPRAZolam Duanne Moron) 1 MG tablet Take 1 tablet (1 mg total) by mouth 3 (three) times daily as needed for anxiety. 08/22/13  Yes Blanchie Serve, MD  buPROPion (WELLBUTRIN XL) 150 MG 24 hr tablet Take 450 mg by mouth daily.   Yes [provider]  carvedilol (COREG) 12.5 MG tablet TAKE 1 TABLET BY MOUTH TWICE A DAY Patient taking differently: Take 12.5 mg by mouth 2 (two) times daily with a meal.  02/26/19  Yes Josue Hector, MD  citalopram (CELEXA) 40 MG tablet Take 40 mg by mouth daily.   Yes [provider]  clobetasol cream (TEMOVATE) 0.99 % Apply 1 application topically 2 (two) times daily as needed (paronychia).  04/08/19  Yes [provider]  ENTRESTO 49-51 MG TAKE 1 TABLET BY MOUTH TWICE A DAY Patient taking differently: Take 1 tablet by mouth 2 (two) times daily.  02/26/19  Yes Josue Hector, MD  esomeprazole (NEXIUM) 20  MG capsule Take 40 mg by mouth daily.    Yes [provider]  folic acid (FOLVITE) 1 MG tablet Take 1 mg by mouth daily.    Yes [provider]  gabapentin (NEURONTIN) 400 MG capsule Take 400 mg by mouth at bedtime as needed (pain).  04/08/19  Yes [provider]  meloxicam (MOBIC) 15 MG tablet Take 15 mg by mouth daily.  04/08/19  Yes [provider]  metFORMIN (GLUCOPHAGE-XR) 500 MG 24 hr tablet Take 500 mg by mouth daily with breakfast.  10/03/17  Yes [provider]  methotrexate (50 MG/ML) 1 g injection Inject into the vein once a week.    Yes [provider]  Multiple Vitamin (MULTIVITAMIN) tablet Take 2 tablets by mouth daily.    Yes [provider]  mycophenolate (CELLCEPT) 500 MG tablet Take 1,500 mg by mouth 2 (two) times daily.  06/05/17  Yes [provider]  polyethylene glycol (MIRALAX / GLYCOLAX) packet Take 17 g by mouth daily as needed for moderate constipation (constipation).    Yes [provider]  dextromethorphan (DELSYM) 30 MG/5ML liquid Take 15 mg by mouth 2 (two) times daily as needed for cough.     [provider]  Fluticasone-Salmeterol (ADVAIR) 100-50 MCG/DOSE AEPB Inhale 1 puff into the lungs 2 (two) times daily as needed (asthma).     [provider]  oxyCODONE (OXY IR/ROXICODONE) 5 MG immediate release tablet Take 5 mg by mouth every 6 (six) hours as needed for pain.  05/28/19   [provider]  phentermine (ADIPEX-P) 37.5 MG tablet Take 37.5 mg by mouth daily. 04/06/18   [provider]  progesterone (PROMETRIUM) 200 MG capsule Take 200 mg by mouth at bedtime. 05/28/19   [provider]     Vital Signs: BP 119/75    Pulse 73    Temp 99.2 F (37.3 C) (Axillary)    Resp (!) 23    Ht 5\' 8"  (1.727 m)    Wt (!) 396 lb 13.3 oz (180 kg)    SpO2 91%    BMI 60.34 kg/m   Physical Exam Constitutional:      Appearance: Normal appearance.  Pulmonary:      Effort: Pulmonary effort is normal.  Abdominal:     Palpations: Abdomen is soft.     Tenderness: There is abdominal tenderness.     Comments: Some tenderness around drain insertion site. Dressing has some old drainage on it. Insertion site is clean and dry. No erythema or swelling observed. JP bulb with approximately 20 cc purulent fluid. Drain easily flushed with 5 cc NS.   Skin:    General: Skin is warm and dry.  Neurological:     Mental Status: She is alert and oriented to person, place, and time.     Imaging: IR Catheter Tube Change  Result Date: 06/24/2019 INDICATION: Presumed diverticular abscess, status post 12 French pigtail drain catheter placement 06/13/2019. Follow-up CT shows continued enlargement of the collection. Drain revision is requested. EXAM: EXCHANGE/UPSIZING PELVIC DRAIN CATHETER UNDER FLUOROSCOPIC GUIDANCE MEDICATIONS: The patient is currently admitted to the hospital and receiving intravenous antibiotics. The antibiotics were administered within an appropriate time frame prior to the initiation of the procedure. ANESTHESIA/SEDATION: Intravenous Fentanyl 77mcg and Versed 1mg  were administered as conscious sedation during continuous monitoring of the patient's level of consciousness and physiological / cardiorespiratory status by the radiology RN, with a total moderate sedation time of 10 minutes. COMPLICATIONS: None immediate. PROCEDURE: Informed written consent was obtained from the patient after a thorough discussion of the procedural risks, benefits and alternatives. All questions were addressed. Maximal Sterile Barrier Technique was utilized including caps, mask, sterile gowns, sterile gloves, sterile drape, hand hygiene and skin antiseptic. A timeout was performed prior to the initiation of the procedure. Skin surrounding the previously placed drain was infiltrated with 1% lidocaine. Small contrast injection opacified the right lower quadrant collection freely. The  previously placed catheter was cut and exchanged over a short Amplatz wire for a 16 French pigtail drain catheter, formed centrally within the collection. Contrast injection confirmed appropriate positioning and patency although the pigtail would not fully lock. The catheter was secured externally with 0 Prolene suture and StatLock and placed to gravity drain bag. The patient tolerated the procedure well. IMPRESSION: 1. Technically successful exchange and upsizing of right lower quadrant drain to a 16 [place] device. Electronically Signed   By: Lucrezia Europe M.D.   On: 06/24/2019 16:24    Labs:  CBC: Recent Labs    06/19/19 0446 06/21/19 0406 06/23/19 0255 06/26/19 0414  WBC 14.3* 15.2* 14.6* 11.7*  HGB 9.7* 9.4* 9.8* 9.1*  HCT 31.8* 31.5* 31.9* 30.4*  PLT 306 271 247 231    COAGS: Recent Labs    06/13/19 1015  INR 1.3*    BMP: Recent Labs    06/24/19 0404 06/25/19 0506 06/26/19 1433 06/27/19 0452  NA 140 140 140   140 139  K 3.5 3.5 3.3*   3.4* 3.3*  CL  105 104 106   105 103  CO2 24 26 23   23 25   GLUCOSE 118* 103* 143*   140* 103*  BUN 24* 18 14   14 15   CALCIUM 8.1* 8.0* 7.8*   7.8* 7.9*  CREATININE 2.83* 1.99* 1.47*   1.51* 1.41*  GFRNONAA 18* 27* 39*   38* 41*  GFRAA 20* 31* 45*   44* 47*    LIVER FUNCTION TESTS: Recent Labs    06/12/19 0209 06/12/19 0209 06/13/19 0321 06/14/19 0121 06/17/19 0221 06/17/19 0221 06/18/19 0446 06/20/19 1104 06/23/19 0255 06/24/19 0404 06/26/19 1433 06/27/19 0452  BILITOT 1.4*  --  1.1  --  0.3  --  0.5  --   --   --   --   --   AST 14*  --  21  --  29  --  38  --   --   --   --   --   ALT 12  --  15  --  22  --  22  --   --   --   --   --   ALKPHOS 89  --  94  --  100  --  102  --   --   --   --   --   PROT 5.9*  --  6.4*  --  5.8*  --  5.7*  --   --   --   --   --   ALBUMIN 2.9*   < > 2.9*   < > 2.4*   < > 2.2*   < > 2.4* 2.4* 2.5* 2.4*   < > = values in this interval not displayed.    Assessment and Plan:  S/P  drain placement by Dr. Vernard Gambles to treat the right pelvic fluid collection on 06/13/19.   Follow up CT on 06/20/19 showed a larger collection. Drain upsized on 06/24/19 by Dr. Vernard Gambles.  Her drain had little output over the past couple of days; gravity bag removed and JP bulb placed on 06/26/19.  Continue routine drain care.  Output follow up order are in place for IR [facility].  Electronically Signed: Theresa Duty, NP 06/27/2019, 3:02 PM   I spent a total of 15 Minutes at the the patient's bedside AND on the patient's hospital floor or unit, greater than 50% of which was counseling/coordinating care for right diverticular drain.

## 2019-06-28 LAB — GLUCOSE, CAPILLARY
Glucose-Capillary: 108 mg/dL — ABNORMAL HIGH (ref 70–99)
Glucose-Capillary: 112 mg/dL — ABNORMAL HIGH (ref 70–99)
Glucose-Capillary: 158 mg/dL — ABNORMAL HIGH (ref 70–99)
Glucose-Capillary: 164 mg/dL — ABNORMAL HIGH (ref 70–99)
Glucose-Capillary: 91 mg/dL (ref 70–99)
Glucose-Capillary: 97 mg/dL (ref 70–99)

## 2019-06-28 LAB — RENAL FUNCTION PANEL
Albumin: 2.6 g/dL — ABNORMAL LOW (ref 3.5–5.0)
Anion gap: 9 (ref 5–15)
BUN: 12 mg/dL (ref 6–20)
CO2: 24 mmol/L (ref 22–32)
Calcium: 8 mg/dL — ABNORMAL LOW (ref 8.9–10.3)
Chloride: 106 mmol/L (ref 98–111)
Creatinine, Ser: 1.15 mg/dL — ABNORMAL HIGH (ref 0.44–1.00)
GFR calc Af Amer: >60 mL/min (ref >60)
GFR calc non Af Amer: 52 mL/min — ABNORMAL LOW (ref >60)
Glucose, Bld: 88 mg/dL (ref 70–99)
Phosphorus: 2.9 mg/dL (ref 2.5–4.6)
Potassium: 3.6 mmol/L (ref 3.5–5.1)
Sodium: 139 mmol/L (ref 135–145)

## 2019-06-28 MED ORDER — LEVOFLOXACIN 500 MG PO TABS
500.0000 mg | ORAL_TABLET | Freq: Every day | ORAL | Status: DC
  Filled 2019-06-28: qty 1

## 2019-06-28 MED ORDER — METRONIDAZOLE 500 MG PO TABS
500.0000 mg | ORAL_TABLET | Freq: Three times a day (TID) | ORAL | Status: DC
  Administered 2019-06-28 – 2019-06-30 (×7): 500 mg via ORAL
  Filled 2019-06-28 (×7): qty 1

## 2019-06-28 MED ORDER — LEVOFLOXACIN 250 MG PO TABS
250.0000 mg | ORAL_TABLET | Freq: Every day | ORAL | Status: DC
  Administered 2019-06-29 – 2019-06-30 (×2): 250 mg via ORAL
  Filled 2019-06-28 (×2): qty 1

## 2019-06-28 MED ORDER — ALBUTEROL SULFATE (2.5 MG/3ML) 0.083% IN NEBU
2.5000 mg | INHALATION_SOLUTION | RESPIRATORY_TRACT | Status: DC | PRN
  Administered 2019-06-28: 2.5 mg via RESPIRATORY_TRACT
  Filled 2019-06-28: qty 3

## 2019-06-28 MED ORDER — LEVOFLOXACIN 500 MG PO TABS
500.0000 mg | ORAL_TABLET | Freq: Every day | ORAL | Status: AC
  Administered 2019-06-28: 500 mg via ORAL
  Filled 2019-06-28: qty 1

## 2019-06-28 NOTE — Progress Notes (Signed)
[facility] PROGRESS NOTE    Progress Note  [name]  RDE:[id number] DOB: Dec 11, 1960 DOA: 06/11/2019 PCP: Christain Sacramento, MD     Brief Narrative:   [name] is an 59 y.o. female past medical history of prior sigmoid diverticulitis with perforation in 2014 history of ILD diabetes mellitus type 2 comes into the hospital for abdominal pain in the ED CT scan showed sigmoid diverticulitis she was started empirically on IV Zosyn, repeated CT showed drain in place with a fluid collection measuring 12 x 8 x 11 which is increased in size compared to previous.  General surgery on board and appreciate assistance, IR had to be reconsulted.  Assessment/Plan:   Sepsis due to acute diverticulitis IR was reconsulted and drain 16 French placement was done on 06/14/2019. Blood cultures show gram variable rods She needs to have more than 100 cc of purulent material. Continue oral Levaquin and Flagyl for at least 3 weeks, she has a follow-up with ID as an outpatient. Patient is medically stable for discharge awaiting skilled nursing facility placement.  Orthostatic hypotension: Per physical therapy, she is currently not on any antihypertensive medication. Likely deconditioning orthostasis now resolved.  Acute kidney injury leading to ATN: In the setting of hypotension, contrast-induced nephropathy and Entresto. Nephrology was consulted he was started on hemodialysis due to uremic symptoms, his creatinine is slowly improving.  Creatinine at baseline, she will have to resume Entresto in 2 to 3 weeks due to her episode of ATN.  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  Controlled diabetes mellitus type 2: With a hemoglobin A1c of 7.0.  Continue long-acting insulin plus sliding scale.  Hypokalemia: Repleted orally now improved at 3.5. The patient metabolic panel in the morning.  Essential  hypertension Entresto was stopped on admission as her blood pressure started to improve and became elevated she was restarted back on her Coreg. Will need to stay off Entresto for at least 2 weeks.    DVT prophylaxis: lovenox Family Communication:none Status is: Inpatient  Remains inpatient appropriate because:Hemodynamically unstable   Dispo: The patient is from: Home              Anticipated d/c is to: Home              Anticipated d/c date is: 1 days              Patient currently is medically stable to d/c.  Code Status:     Code Status Orders  (From admission, onward)         Start     Ordered   06/11/19 1845  Full code  Continuous        06/11/19 1845        Code Status History    Date Active Date Inactive Code Status Order ID Comments User Context   06/03/2019 0624 06/03/2019 1507 Full Code [id number]  Everlene Farrier, MD Inpatient   06/23/2013 1958 07/22/2013 1426 Full Code [id number]  Caren Griffins, MD ED   09/23/2012 1836 10/05/2012 1516 Full Code [id number]  Jackolyn Confer, MD ED   Advance Care Planning Activity        IV Access:    Peripheral IV   Procedures and diagnostic studies:   No results found.   Medical Consultants:    None.  Anti-Infectives:   Rocephin and flagyl  Subjective:    [name] Hilts  [name] feels great no complaints.  Objective:    Vitals:   06/27/19 2000 06/27/19 2340 06/28/19 0400 06/28/19 0800  BP: 129/62 (!) 146/82 (!) 152/82 (!) 147/78  Pulse: 73 76 78 78  Resp: 19 19 18 16   Temp: 98.8 F (37.1 C) 98.4 F (36.9 C) 98 F (36.7 C) 98.2 F (36.8 C)  TempSrc: Oral Oral Oral Oral  SpO2: 96% 95% 91% 91%  Weight:      Height:       SpO2: 91 % O2 Flow Rate (L/min): 2 L/min   Intake/Output Summary (Last 24 hours) at 06/28/2019 0958 Last data filed at 06/28/2019 0700 Gross per 24 hour  Intake --  Output 2270 ml  Net -2270 ml   Filed Weights   06/19/19 0720 06/19/19 1059 06/23/19 0359  Weight: (!) 180.1 kg  (!) 176.6 kg (!) 180 kg    Exam: General exam: In no acute distress. Respiratory system: Good air movement and clear to auscultation. Cardiovascular system: S1 & S2 heard, RRR. No JVD. Gastrointestinal system: Abdomen is nondistended, soft and nontender.  Extremities: No pedal edema. Skin: No rashes, lesions or ulcers  Data Reviewed:    Labs: Basic Metabolic Panel: Recent Labs  Lab 06/23/19 0255 06/23/19 0255 06/24/19 0404 06/24/19 0404 06/25/19 0506 06/25/19 0506 06/26/19 1433 06/26/19 1433 06/27/19 0452 06/28/19 0307  NA 141   < > 140  --  140  --  140  140  --  139 139  K 3.4*   < > 3.5   < > 3.5   < > 3.3*  3.4*   < > 3.3* 3.6  CL 104   < > 105  --  104  --  106  105  --  103 106  CO2 23   < > 24  --  26  --  23  23  --  25 24  GLUCOSE 113*   < > 118*  --  103*  --  143*  140*  --  103* 88  BUN 27*   < > 24*  --  18  --  14  14  --  15 12  CREATININE 3.81*   < > 2.83*  --  1.99*  --  1.47*  1.51*  --  1.41* 1.15*  CALCIUM 8.1*   < > 8.1*  --  8.0*  --  7.8*  7.8*  --  7.9* 8.0*  PHOS 5.2*  --  4.0  --   --   --  3.4  --  3.8 2.9   < > = values in this interval not displayed.   GFR Estimated Creatinine Clearance: 92.8 mL/min (A) (by C-G formula based on SCr of 1.15 mg/dL (H)). Liver Function Tests: Recent Labs  Lab 06/23/19 0255 06/24/19 0404 06/26/19 1433 06/27/19 0452 06/28/19 0307  ALBUMIN 2.4* 2.4* 2.5* 2.4* 2.6*   No results for input(s): LIPASE, AMYLASE in the last 168 hours. No results for input(s): AMMONIA in the last 168 hours. Coagulation profile No results for input(s): INR, PROTIME in the last 168 hours. COVID-19 Labs  No results for input(s): DDIMER, FERRITIN, LDH, CRP in the last 72 hours.  Lab Results  Component Value Date   SARSCOV2NAA NEGATIVE 06/11/2019   [place] NEGATIVE 05/30/2019    CBC: Recent Labs  Lab 06/23/19 0255 06/26/19 0414  WBC 14.6* 11.7*  HGB 9.8* 9.1*  HCT 31.9* 30.4*  MCV 86.0 87.6  PLT 247 231     Cardiac  Enzymes: No results for input(s): CKTOTAL, CKMB, CKMBINDEX, TROPONINI in the last 168 hours. BNP (last 3 results) No results for input(s): PROBNP in the last 8760 hours. CBG: Recent Labs  Lab 06/27/19 1621 06/27/19 2024 06/27/19 2339 06/28/19 0434 06/28/19 0832  GLUCAP 119* 187* 121* 91 97   D-Dimer: No results for input(s): DDIMER in the last 72 hours. Hgb A1c: No results for input(s): HGBA1C in the last 72 hours. Lipid Profile: No results for input(s): CHOL, HDL, LDLCALC, TRIG, CHOLHDL, LDLDIRECT in the last 72 hours. Thyroid function studies: No results for input(s): TSH, T4TOTAL, T3FREE, THYROIDAB in the last 72 hours.  Invalid input(s): FREET3 Anemia work up: No results for input(s): VITAMINB12, FOLATE, FERRITIN, TIBC, IRON, RETICCTPCT in the last 72 hours. Sepsis Labs: Recent Labs  Lab 06/23/19 0255 06/26/19 0414  WBC 14.6* 11.7*   Microbiology No results found for this or any previous visit (from the past 240 hour(s)).   Medications:   . buPROPion  450 mg Oral Daily  . carvedilol  12.5 mg Oral BID WC  . Chlorhexidine Gluconate Cloth  6 each Topical Daily  . citalopram  40 mg Oral Daily  . enoxaparin (LOVENOX) injection  90 mg Subcutaneous Q24H  . feeding supplement (NEPRO CARB STEADY)  237 mL Oral BID BM  . insulin aspart  0-9 Units Subcutaneous Q4H  . levofloxacin  500 mg Oral Daily  . metroNIDAZOLE  500 mg Oral Q8H  . nystatin   Topical BID  . pantoprazole  40 mg Oral Daily  . saccharomyces boulardii  250 mg Oral BID  . sodium chloride flush  5 mL Intracatheter Q8H   Continuous Infusions: . cefTRIAXone (ROCEPHIN)  IV 2 g (06/27/19 1237)  . sodium chloride Stopped (06/12/19 0949)      LOS: 17 days   Charlynne Cousins  [facility]  06/28/2019, 9:58 AM

## 2019-06-28 NOTE — Progress Notes (Addendum)
OT Cancellation Note  Patient Details Name: [name] MRN: [id number] DOB: [birth year]   Cancelled Treatment:    Reason Eval/Treat Not Completed: Patient declined, no reason specified;Other (comment).  Attempted to see pt. For skilled OT treatment session. Pt. States "which one are you, I dont need occupational I need physical".  Reviewed how both disciplines are inter related.  That doing mobility is necessary to get you to a sink, or bathroom to perform ADLS.  Pt. Then states "yeah, okay".  Reluctantly agreeable to bed mobility and attempt to sit eob. When I was looking at foot of bed at key pad for adj. Of bed pt. States "do you even know what youre doing".  I reviewed that I did and that I was looking how to adjust firmness of mattress if we were going to be getting eob.  Pt. Then states "you know what im actually having a bowel movement right now, yep I am.  But you know what dont feel bad that its not going to work out right now thank you so much for coming to say hi.  dont be upset if you cant make it back later you can tell the other girl I think I have decided to go to a place but just short term".  Will attempt back as able.     Daiva Huge Lorraine-COTA/L 06/28/2019, 11:55 AM

## 2019-06-28 NOTE — Progress Notes (Signed)
   06/28/19 1815  Assess: MEWS Score  Temp 98.4 F (36.9 C)  BP 140/67  Pulse Rate (!) 117  ECG Heart Rate (!) 104  Resp (!) 25  SpO2 (!) 82 %  O2 Device Room Air  Assess: MEWS Score  MEWS Temp 0  MEWS Systolic 0  MEWS Pulse 1  MEWS RR 1  MEWS LOC 0  MEWS Score 2  MEWS Score Color Yellow  Assess: if the MEWS score is Yellow or Red  Were vital signs taken at a resting state? No  Focused Assessment Documented focused assessment  Early Detection of Sepsis Score *See Row Information* Low  MEWS guidelines implemented *See Row Information* Yes  Treat  MEWS Interventions Administered prn meds/treatments  Take Vital Signs  Increase Vital Sign Frequency  Yellow: Q 2hr X 2 then Q 4hr X 2, if remains yellow, continue Q 4hrs  Escalate  MEWS: Escalate Yellow: discuss with charge nurse/RN and consider discussing with provider and RRT  Notify: Charge Nurse/RN  Name of Charge Nurse/RN Notified Patrici Ranks, RN  Date Charge Nurse/RN Notified 06/28/19  Time Charge Nurse/RN Notified 1815  Notify: Provider  Provider Name/Title Dr. Olevia Bowens  Date Provider Notified 06/28/19  Time Provider Notified 1815  Notification Type Page  Notification Reason Change in status  Response See new orders  Date of Provider Response 06/28/19  Time of Provider Response 1820  Notify: Rapid Response  Name of Rapid Response RN Notified  (no need)  Document  Patient Outcome Stabilized after interventions  Progress note created (see row info) Yes

## 2019-06-28 NOTE — Progress Notes (Signed)
Writer and the charge nurse as cleaning up the patient when she started to complained of SOB and asking to sit at the edge of the bed. Patient was helped to sit at the edge of the bed; she was gasping for air, having a panic attack, wheezing on ascultation. Sat O2 in 80s RA. ON 3 L oxygen via [place] sat O2 - 94%. MD made aware and patient received Albuterol per order. Writer encouraged patient to sit for a while in the chair. At this time patient verbalized that she is feeling better. VS stable.  Will continue to monitor.

## 2019-06-29 LAB — RENAL FUNCTION PANEL
Albumin: 2.6 g/dL — ABNORMAL LOW (ref 3.5–5.0)
Anion gap: 8 (ref 5–15)
BUN: 12 mg/dL (ref 6–20)
CO2: 26 mmol/L (ref 22–32)
Calcium: 8.3 mg/dL — ABNORMAL LOW (ref 8.9–10.3)
Chloride: 105 mmol/L (ref 98–111)
Creatinine, Ser: 1.07 mg/dL — ABNORMAL HIGH (ref 0.44–1.00)
GFR calc Af Amer: >60 mL/min (ref >60)
GFR calc non Af Amer: 57 mL/min — ABNORMAL LOW (ref >60)
Glucose, Bld: 113 mg/dL — ABNORMAL HIGH (ref 70–99)
Phosphorus: 2.5 mg/dL (ref 2.5–4.6)
Potassium: 3.6 mmol/L (ref 3.5–5.1)
Sodium: 139 mmol/L (ref 135–145)

## 2019-06-29 LAB — GLUCOSE, CAPILLARY
Glucose-Capillary: 108 mg/dL — ABNORMAL HIGH (ref 70–99)
Glucose-Capillary: 108 mg/dL — ABNORMAL HIGH (ref 70–99)
Glucose-Capillary: 110 mg/dL — ABNORMAL HIGH (ref 70–99)
Glucose-Capillary: 135 mg/dL — ABNORMAL HIGH (ref 70–99)
Glucose-Capillary: 145 mg/dL — ABNORMAL HIGH (ref 70–99)
Glucose-Capillary: 148 mg/dL — ABNORMAL HIGH (ref 70–99)

## 2019-06-29 MED ORDER — FUROSEMIDE 20 MG PO TABS
20.0000 mg | ORAL_TABLET | Freq: Every day | ORAL | Status: DC
  Administered 2019-06-29 – 2019-06-30 (×2): 20 mg via ORAL
  Filled 2019-06-29 (×2): qty 1

## 2019-06-29 NOTE — Progress Notes (Signed)
[facility] PROGRESS NOTE    Progress Note  [name]  HGD:[id number] DOB: 30-Aug-1960 DOA: 06/11/2019 PCP: Christain Sacramento, MD     Brief Narrative:   [name] is an 59 y.o. female past medical history of prior sigmoid diverticulitis with perforation in 2014 history of ILD diabetes mellitus type 2 comes into the hospital for abdominal pain in the ED CT scan showed sigmoid diverticulitis she was started empirically on IV Zosyn, repeated CT showed drain in place with a fluid collection measuring 12 x 8 x 11 which is increased in size compared to previous.  General surgery on board and appreciate assistance, IR had to be reconsulted.  Assessment/Plan:   Sepsis due to acute diverticulitis IR was reconsulted and drain 16 French placement was done on 06/14/2019. Continue Levaquin and Flagyl. She is put out less than 100 cc of purulent material. Continue oral Levaquin and Flagyl for at least 3 weeks, she has a follow-up with ID as an outpatient. Patient is medically stable for transfer awaiting skilled nursing facility placement.  Orthostatic hypotension: Per physical therapy, she is currently not on any antihypertensive medication. Likely deconditioning orthostasis now resolved.  Acute kidney injury leading to ATN: In the setting of hypotension, contrast-induced nephropathy and Entresto. Nephrology was consulted he was started on hemodialysis due to uremic symptoms, his creatinine is slowly improving.  Creatinine at baseline, she will have to resume Entresto in 2 to 3 weeks due to her episode of ATN. Started on low-dose Lasix.  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  Controlled diabetes mellitus type 2: With a hemoglobin A1c of 7.0.  Continue long-acting insulin plus sliding scale.  Hypokalemia: Repleted orally now improved at 3.5. The patient metabolic panel in the  morning.  Essential hypertension Entresto was stopped on admission as her blood pressure started to improve and became elevated she was restarted back on her Coreg. Will need to stay off Entresto for at least 2 weeks. Blood pressure is well controlled.   DVT prophylaxis: lovenox Family Communication:none Status is: Inpatient  Remains inpatient appropriate because:Hemodynamically unstable   Dispo: The patient is from: Home              Anticipated d/c is to: Home              Anticipated d/c date is: 1 days              Patient currently is medically stable to d/c.  Code Status:     Code Status Orders  (From admission, onward)         Start     Ordered   06/11/19 1845  Full code  Continuous        06/11/19 1845        Code Status History    Date Active Date Inactive Code Status Order ID Comments User Context   06/03/2019 0624 06/03/2019 1507 Full Code [id number]  Everlene Farrier, MD Inpatient   06/23/2013 1958 07/22/2013 1426 Full Code [id number]  Caren Griffins, MD ED   09/23/2012 1836 10/05/2012 1516 Full Code [id number]  Jackolyn Confer, MD ED   Advance Care Planning Activity        IV Access:    Peripheral IV   Procedures and diagnostic studies:   No results found.   Medical Consultants:    None.  Anti-Infectives:   Rocephin and flagyl  Subjective:    [name] feels great no complaints.  Objective:    Vitals:   06/28/19 2109 06/28/19 2332 06/29/19 0322 06/29/19 0807  BP: 119/75 137/71 138/69 125/75  Pulse: 75 80 79 77  Resp: (!) 21 20 16 15   Temp: 98.3 F (36.8 C) 98.9 F (37.2 C) 97.9 F (36.6 C) 98.5 F (36.9 C)  TempSrc: Oral Oral Oral Oral  SpO2: 98% 97% 96% 98%  Weight:      Height:       SpO2: 98 % O2 Flow Rate (L/min): 2 L/min   Intake/Output Summary (Last 24 hours) at 06/29/2019 0959 Last data filed at 06/29/2019 0954 Gross per 24 hour  Intake 665 ml  Output 1753.5 ml  Net -1088.5 ml   Filed Weights    06/19/19 0720 06/19/19 1059 06/23/19 0359  Weight: (!) 180.1 kg (!) 176.6 kg (!) 180 kg    Exam: General exam: In no acute distress. Respiratory system: Good air movement and clear to auscultation. Cardiovascular system: S1 & S2 heard, RRR. No JVD. Gastrointestinal system: Abdomen is nondistended, soft and nontender.  Extremities: No pedal edema. Skin: No rashes, lesions or ulcers  Data Reviewed:    Labs: Basic Metabolic Panel: Recent Labs  Lab 06/24/19 0404 06/24/19 0404 06/25/19 0506 06/25/19 0506 06/26/19 1433 06/26/19 1433 06/27/19 0452 06/27/19 0452 06/28/19 0307 06/29/19 0240  NA 140   < > 140  --  140  140  --  139  --  139 139  K 3.5   < > 3.5   < > 3.3*  3.4*   < > 3.3*   < > 3.6 3.6  CL 105   < > 104  --  106  105  --  103  --  106 105  CO2 24   < > 26  --  23  23  --  25  --  24 26  GLUCOSE 118*   < > 103*  --  143*  140*  --  103*  --  88 113*  BUN 24*   < > 18  --  14  14  --  15  --  12 12  CREATININE 2.83*   < > 1.99*  --  1.47*  1.51*  --  1.41*  --  1.15* 1.07*  CALCIUM 8.1*   < > 8.0*  --  7.8*  7.8*  --  7.9*  --  8.0* 8.3*  PHOS 4.0  --   --   --  3.4  --  3.8  --  2.9 2.5   < > = values in this interval not displayed.   GFR Estimated Creatinine Clearance: 99.8 mL/min (A) (by C-G formula based on SCr of 1.07 mg/dL (H)). Liver Function Tests: Recent Labs  Lab 06/24/19 0404 06/26/19 1433 06/27/19 0452 06/28/19 0307 06/29/19 0240  ALBUMIN 2.4* 2.5* 2.4* 2.6* 2.6*   No results for input(s): LIPASE, AMYLASE in the last 168 hours. No results for input(s): AMMONIA in the last 168 hours. Coagulation profile No results for input(s): INR, PROTIME in the last 168 hours. COVID-19 Labs  No results for input(s): DDIMER, FERRITIN, LDH, CRP in the last 72 hours.  Lab Results  Component Value Date   SARSCOV2NAA NEGATIVE 06/11/2019   [place] NEGATIVE 05/30/2019    CBC: Recent Labs  Lab 06/23/19 0255 06/26/19 0414  WBC 14.6* 11.7*    HGB 9.8* 9.1*  HCT 31.9* 30.4*  MCV 86.0 87.6  PLT  247 231   Cardiac Enzymes: No results for input(s): CKTOTAL, CKMB, CKMBINDEX, TROPONINI in the last 168 hours. BNP (last 3 results) No results for input(s): PROBNP in the last 8760 hours. CBG: Recent Labs  Lab 06/28/19 1138 06/28/19 1614 06/28/19 1942 06/28/19 2336 06/29/19 0325  GLUCAP 112* 158* 164* 108* 108*   D-Dimer: No results for input(s): DDIMER in the last 72 hours. Hgb A1c: No results for input(s): HGBA1C in the last 72 hours. Lipid Profile: No results for input(s): CHOL, HDL, LDLCALC, TRIG, CHOLHDL, LDLDIRECT in the last 72 hours. Thyroid function studies: No results for input(s): TSH, T4TOTAL, T3FREE, THYROIDAB in the last 72 hours.  Invalid input(s): FREET3 Anemia work up: No results for input(s): VITAMINB12, FOLATE, FERRITIN, TIBC, IRON, RETICCTPCT in the last 72 hours. Sepsis Labs: Recent Labs  Lab 06/23/19 0255 06/26/19 0414  WBC 14.6* 11.7*   Microbiology No results found for this or any previous visit (from the past 240 hour(s)).   Medications:   . buPROPion  450 mg Oral Daily  . carvedilol  12.5 mg Oral BID WC  . citalopram  40 mg Oral Daily  . enoxaparin (LOVENOX) injection  90 mg Subcutaneous Q24H  . feeding supplement (NEPRO CARB STEADY)  237 mL Oral BID BM  . insulin aspart  0-9 Units Subcutaneous Q4H  . levofloxacin  250 mg Oral Daily  . metroNIDAZOLE  500 mg Oral Q8H  . nystatin   Topical BID  . pantoprazole  40 mg Oral Daily  . saccharomyces boulardii  250 mg Oral BID  . sodium chloride flush  5 mL Intracatheter Q8H   Continuous Infusions: . sodium chloride Stopped (06/12/19 0949)      LOS: 18 days   Charlynne Cousins  [facility]  06/29/2019, 9:59 AM

## 2019-06-29 NOTE — Progress Notes (Addendum)
Called to room. Patient lying supine in bed and stated, "this catheter is not right." Small amount of urine noted on bed pad. Catheter found to be lodged under patients leg. Freed catheter from under leg, advanced catheter and reinflated balloon. No signs of distress or discomfort noted. Catheter still leaking and so was removed. Purewick placed by [place], NT.

## 2019-06-30 LAB — RENAL FUNCTION PANEL
Albumin: 2.6 g/dL — ABNORMAL LOW (ref 3.5–5.0)
Anion gap: 9 (ref 5–15)
BUN: 13 mg/dL (ref 6–20)
CO2: 26 mmol/L (ref 22–32)
Calcium: 8.3 mg/dL — ABNORMAL LOW (ref 8.9–10.3)
Chloride: 106 mmol/L (ref 98–111)
Creatinine, Ser: 1.08 mg/dL — ABNORMAL HIGH (ref 0.44–1.00)
GFR calc Af Amer: >60 mL/min (ref >60)
GFR calc non Af Amer: 57 mL/min — ABNORMAL LOW (ref >60)
Glucose, Bld: 138 mg/dL — ABNORMAL HIGH (ref 70–99)
Phosphorus: 2.5 mg/dL (ref 2.5–4.6)
Potassium: 3.5 mmol/L (ref 3.5–5.1)
Sodium: 141 mmol/L (ref 135–145)

## 2019-06-30 LAB — GLUCOSE, CAPILLARY
Glucose-Capillary: 121 mg/dL — ABNORMAL HIGH (ref 70–99)
Glucose-Capillary: 103 mg/dL — ABNORMAL HIGH (ref 70–99)
Glucose-Capillary: 103 mg/dL — ABNORMAL HIGH (ref 70–99)

## 2019-06-30 LAB — SARS CORONAVIRUS 2 BY RT PCR (HOSPITAL ORDER, PERFORMED IN ~~LOC~~ HOSPITAL LAB): SARS Coronavirus 2: NEGATIVE

## 2019-06-30 MED ORDER — AMOXICILLIN-POT CLAVULANATE 875-125 MG PO TABS: 1.0000 | ORAL_TABLET | Freq: Two times a day (BID) | ORAL | 0 refills | Status: AC

## 2019-06-30 MED ORDER — METRONIDAZOLE 500 MG PO TABS: 500.0000 mg | ORAL_TABLET | Freq: Three times a day (TID) | ORAL | 0 refills | Status: DC

## 2019-06-30 MED ORDER — ENTRESTO 49-51 MG PO TABS: 1.0000 | ORAL_TABLET | Freq: Two times a day (BID) | ORAL | Status: DC

## 2019-06-30 MED ORDER — METHOTREXATE CHEMO INJECTION (PF) 1 GM **50 MG/ML**: 50.0000 mg | INTRAMUSCULAR | Status: DC

## 2019-06-30 MED ORDER — ALPRAZOLAM 1 MG PO TABS: 1.0000 mg | ORAL_TABLET | Freq: Three times a day (TID) | ORAL | 0 refills | Status: AC | PRN

## 2019-06-30 MED ORDER — MYCOPHENOLATE MOFETIL 500 MG PO TABS: 1500.0000 mg | ORAL_TABLET | Freq: Two times a day (BID) | ORAL | Status: DC

## 2019-06-30 MED ORDER — LEVOFLOXACIN 250 MG PO TABS: 250.0000 mg | ORAL_TABLET | Freq: Every day | ORAL | 0 refills | Status: DC

## 2019-06-30 NOTE — Progress Notes (Signed)
Occupational Therapy Treatment Patient Details Name: [name] MRN: [id number] DOB: 08-20-1960 Today's Date: 06/30/2019    History of present illness 59 y.o. 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 She presented to the ED with complaints of marked lower quadrant pain. Pt admitted for sigmoid diverticulitis with perforation and abscess. Drain placed 6/4.   OT comments  Patient able to complete bed mobility to EOB with min assist.  Patient incontinent of bowels prior to arrival and required max assist with toileting/LB bathing.  Patient able to stand with min guard and RW though not able to maintain balance while wiping.  Patient very motivated to be independent.  Recommending SNF to increase strength and independence prior to returning home.  Will continue to follow with OT acutely to address the deficits listed below.    Follow Up Recommendations  SNF;Supervision - Intermittent    Equipment Recommendations  3 in 1 bedside commode    Recommendations for Other Services      Precautions / Restrictions Precautions Precautions: Fall Precaution Comments: often incontinent of bowels Other Brace: R LQ drain Restrictions Weight Bearing Restrictions: No       Mobility Bed Mobility Overal bed mobility: Needs Assistance Bed Mobility: Rolling;Sidelying to Sit Rolling: Min assist Sidelying to sit: Min assist       General bed mobility comments: Min assist for rolling for truncal translation and completion of roll onto side, Min assist for sidelying to sit for trunk and LE management, pt able to scoot to EOB without PT assist.  Transfers Overall transfer level: Needs assistance Equipment used: Rolling walker (2 wheeled) Transfers: Sit to/from [facility] Sit to Stand: Min guard Stand pivot transfers: Min guard       General  transfer comment: for safety, verbal cuing for hand placement and PT bracing RW per pt request and for pt comfort. Repeated sit to stands for LE strengthening and improving transfers (see exercise section).    Balance Overall balance assessment: Needs assistance Sitting-balance support: Feet supported;No upper extremity supported Sitting balance-Leahy Scale: Fair     Standing balance support: Bilateral upper extremity supported;Single extremity supported Standing balance-Leahy Scale: Poor Standing balance comment: reliant on external assist in standing                           ADL either performed or assessed with clinical judgement   ADL Overall ADL's : Needs assistance/impaired             Lower Body Bathing: Maximal assistance;Sit to/from stand               Toileting- Water quality scientist and Hygiene: Maximal assistance;Sit to/from stand       Functional mobility during ADLs: Min guard;Rolling walker General ADL Comments: Patient incontinent of bowels in bed prior to arrival     Vision       Perception     Praxis      Cognition Arousal/Alertness: Awake/alert Behavior During Therapy: WFL for tasks assessed/performed Overall Cognitive Status: Within Functional Limits for tasks assessed                                 General Comments: Patient is stubborn and likes to be independent, though will tell you if she needs something.  Required repeated  explanation of importance of OT in addition to PT        Exercises    Shoulder Instructions       General Comments Patient very concerned about shortness of breath.  Watches monitor throughout session.  On RA and SpO2 breifly down to 89, though mostly 91-93.    Pertinent Vitals/ Pain       Pain Assessment: Faces Faces Pain Scale: Hurts a little bit Pain Location: abdomen (R lower quadrant) Pain Descriptors / Indicators: Discomfort;Guarding;Sore Pain Intervention(s): Limited  activity within patient's tolerance;Monitored during session;Repositioned  Home Living                                          Prior Functioning/Environment              Frequency  Min 2X/week        Progress Toward Goals  OT Goals(current goals can now be found in the care plan section)  Progress towards OT goals: Progressing toward goals  Acute Rehab OT Goals Patient Stated Goal: to get moving faster OT Goal Formulation: With patient Time For Goal Achievement: 07/01/19 Potential to Achieve Goals: Good  Plan Discharge plan remains appropriate    Co-evaluation                 AM-PAC OT "6 Clicks" Daily Activity     Outcome Measure   Help from another person eating meals?: A Little Help from another person taking care of personal grooming?: A Little Help from another person toileting, which includes using toliet, bedpan, or urinal?: A Lot Help from another person bathing (including washing, rinsing, drying)?: A Lot Help from another person to put on and taking off regular upper body clothing?: A Little Help from another person to put on and taking off regular lower body clothing?: A Lot 6 Click Score: 15    End of Session Equipment Utilized During Treatment: Rolling walker  OT Visit Diagnosis: Muscle weakness (generalized) (M62.81);Other symptoms and signs involving cognitive function;Pain Pain - Right/Left: Right Pain - part of body:  (abdomen)   Activity Tolerance Patient tolerated treatment well   Patient Left in chair;with call bell/phone within reach   Nurse Communication Mobility status        Time: 1046-1100 OT Time Calculation (min): 14 min  Charges: OT General Charges $OT Visit: 1 Visit OT Treatments $Self Care/Home Management : 8-22 mins  August Luz, OTR/L    Phylliss Bob 06/30/2019, 3:13 PM

## 2019-06-30 NOTE — Progress Notes (Signed)
Physical Therapy Treatment Patient Details Name: [name] MRN: [id number] DOB: 1960/03/15 Today's Date: 06/30/2019    History of Present Illness 59 y.o. 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 She presented to the ED with complaints of marked lower quadrant pain. Pt admitted for sigmoid diverticulitis with perforation and abscess. Drain placed 6/4.    PT Comments    PT saw pt back-to-back with OT to address pt mobility and ADL goals. Pt required min guard to min assist for bed mobility and transfer OOB, pt stating a mobility goal of hers is to transfer independently and walk. Pt tolerated short distance ambulation, repeated sit to stands for LE strengthening and transfer training, and LE exercises in recliner. PT continuing to recommend SNF level of care post-acutely, as pt needs physical assist to mobilize and is very deconditioned vs baseline.    Follow Up Recommendations  SNF;Other (comment) (I would like to see her go ST SNF 7-10 days then HHPT)     Equipment Recommendations  None recommended by PT    Recommendations for Other Services       Precautions / Restrictions Precautions Precautions: Fall Other Brace: R LQ drain Restrictions Weight Bearing Restrictions: No    Mobility  Bed Mobility Overal bed mobility: Needs Assistance Bed Mobility: Rolling;Sidelying to Sit Rolling: Min assist Sidelying to sit: Min assist       General bed mobility comments: Min assist for rolling for truncal translation and completion of roll onto side, Min assist for sidelying to sit for trunk and LE management, pt able to scoot to EOB without PT assist.  Transfers Overall transfer level: Needs assistance Equipment used: Rolling walker (2 wheeled) Transfers: Sit to/from Stand Sit to Stand: Min guard         General transfer comment: for safety,  verbal cuing for hand placement and PT bracing RW per pt request and for pt comfort. Repeated sit to stands for LE strengthening and improving transfers (see exercise section).  Ambulation/Gait Ambulation/Gait assistance: Min assist Gait Distance (Feet): 3 Feet Assistive device: Rolling walker (2 wheeled) Gait Pattern/deviations: Step-through pattern;Decreased stride length;Trunk flexed;Shuffle Gait velocity: decr   General Gait Details: Min assist to steady, guide to to recliner via small steps.   Stairs             Wheelchair Mobility    Modified Rankin (Stroke Patients Only)       Balance Overall balance assessment: Needs assistance Sitting-balance support: Feet supported;No upper extremity supported Sitting balance-Leahy Scale: Fair     Standing balance support: Bilateral upper extremity supported;Single extremity supported Standing balance-Leahy Scale: Poor Standing balance comment: reliant on external assist in standing                            Cognition Arousal/Alertness: Awake/alert Behavior During Therapy: WFL for tasks assessed/performed Overall Cognitive Status: Within Functional Limits for tasks assessed                                 General Comments: Pt is very direct with wants and needs, follows commands well when she understands why task is important      Exercises General Exercises - Lower Extremity Ankle Circles/Pumps: AROM;Both;20 reps;Seated Quad Sets: AROM;Both;5 reps;Seated Mini-Sqauts: AROM;Both;Other reps (comment);Seated;Standing (sit to stands from  recliner x3, verbal cuing for sequencing and pt using rocking momentum to stand)    General Comments        Pertinent Vitals/Pain Pain Assessment: Faces Faces Pain Scale: Hurts a little bit Pain Location: abdomen (R lower quadrant) Pain Descriptors / Indicators: Discomfort;Guarding;Sore Pain Intervention(s): Limited activity within patient's  tolerance;Monitored during session;Repositioned    Home Living                      Prior Function            PT Goals (current goals can now be found in the care plan section) Acute Rehab PT Goals PT Goal Formulation: With patient Time For Goal Achievement: 07/14/19 Potential to Achieve Goals: Good Progress towards PT goals: Progressing toward goals    Frequency    Min 2X/week      PT Plan Current plan remains appropriate    Co-evaluation              AM-PAC PT "6 Clicks" Mobility   Outcome Measure  Help needed turning from your back to your side while in a flat bed without using bedrails?: A Little Help needed moving from lying on your back to sitting on the side of a flat bed without using bedrails?: A Little Help needed moving to and from a bed to a chair (including a wheelchair)?: A Little Help needed standing up from a chair using your arms (e.g., wheelchair or bedside chair)?: A Little Help needed to walk in hospital room?: A Little Help needed climbing 3-5 steps with a railing? : A Lot 6 Click Score: 17    End of Session   Activity Tolerance: Patient tolerated treatment well;Patient limited by fatigue Patient left: in chair;with call bell/phone within reach;Other (comment) (pt verbalizes understanding to press call button and wait for assist prior to mobilizing back to bed) Nurse Communication: Mobility status PT Visit Diagnosis: Other abnormalities of gait and mobility (R26.89);Muscle weakness (generalized) (M62.81);Difficulty in walking, not elsewhere classified (R26.2)     Time: 1100-1113 PT Time Calculation (min) (ACUTE ONLY): 13 min  Charges:  $Therapeutic Activity: 8-22 mins                     Jann Milkovich E, PT Acute Rehabilitation Services Pager [phone]  Office [phone]    Kemet Nijjar D Courtne Lighty 06/30/2019, 2:50 PM

## 2019-06-30 NOTE — Care Management Important Message (Signed)
Important Message  Patient Details  Name: [name] MRN: [id number] Date of Birth: 1960/04/18   Medicare Important Message Given:  Yes     [name] 06/30/2019, 3:20 PM

## 2019-06-30 NOTE — TOC Progression Note (Addendum)
Transition of Care [facility]) - Progression Note    Patient Details  Name: [name] MRN: [id number] Date of Birth: 1960/11/23  Transition of [facility]) CM/SW [place], LCSW Phone Number: 06/30/2019, 8:43 AM  Clinical Narrative:    SNF barrier is pending Pasrr approval and updated [place] has received insurance approval.   Update: pasrr received [id number]   Expected Discharge Plan: [place] Barriers to Discharge: [place] [facility])  Expected Discharge Plan and Services Expected Discharge Plan: [place] In-house Referral: Clinical Social Work   Post Acute Care Choice: [place] Living arrangements for the past 2 months: Single Family Home                                       Social Determinants of Health (SDOH) Interventions    Readmission Risk Interventions No flowsheet data found.

## 2019-06-30 NOTE — TOC Transition Note (Signed)
Transition of Care [facility]) - CM/SW Discharge Note   Patient Details  Name: [name] MRN: [id number] Date of Birth: 09/22/1960  Transition of Care [facility]) CM/SW Contact:  Benard [place], LCSW Phone Number: 06/30/2019, 12:30 PM   Clinical Narrative:    Patient will DC to: [facility] Anticipated DC date: 06/30/19 Family notified: Pt declined and will let her sister and spouse know Transport by: [facility]y Harold after COVID results negative   Per MD patient ready for DC to [facility]. RN, patient, patient's family, and facility notified of DC. Discharge Summary and FL2 sent to facility. RN to call report prior to discharge [phone]). DC packet on chart including signed scripts. Ambulance transport requested for patient.   CSW will sign off for now as social work intervention is no longer needed. Please consult [place] again if new needs arise.      Final next level of care: Skilled Nursing Facility Barriers to Discharge: Barriers Resolved   Patient Goals and CMS Choice Patient states their goals for this hospitalization and ongoing recovery are:: Rehab CMS Medicare.gov Compare Post Acute Care list provided to:: Patient Choice offered to / list presented to : Patient  Discharge Placement PASRR number recieved: 06/30/19            Patient [facility] bed at: [facility] Patient to be transferred to facility by: [place] Name of family member notified: Pt declined and will let her spouse know Patient and family notified of of transfer: 06/30/19  Discharge Plan and Services In-house Referral: Clinical Social Work   Post Acute Care Choice: [place]                               Social Determinants of Health (SDOH) Interventions     Readmission Risk Interventions No flowsheet data found.

## 2019-06-30 NOTE — Progress Notes (Signed)
ID Progress Note:   [name] is a 59 y.o. female   Principal Problem:   Colonic diverticular abscess Active Problems:   Acute diverticulitis   Obesity (BMI 30-39.9)   ILD (interstitial lung disease) ([place])   Hypertension   Acute kidney injury (AKI) with acute tubular necrosis (ATN) (HCC)   Pressure injury of skin   Bacteremia due to bacteroides species   Candidal intertrigo  She is now on day 20 of antibiotics to treat acute ruptured diverticular abscess - she is discharging to SNF today and will continue with percutaneous drain until follow up appointment with IR clinic.   Will continue her treatment with Augmentin BID to make her regimen a bit easier with coverage similar to what she has been getting with Ceftriaxone + Metronidazole.  This should be continued for 2 weeks until her follow up appt with ID. Hopefully she will have an appt with the drain clinic during this interval so we can help gauge further duration of therapy. At this time the drain output still remains quite low.    Virtual telephone appointment with SNF has been made for July 6th @ 3:15 pm with Dr. Megan Salon to follow up. She will discharge to [facility].    [name], MSN, NP-C [facility] [name] Pager: [phone] Office: [phone] [place]: [phone]

## 2019-06-30 NOTE — Progress Notes (Signed)
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 Authorizing Provider  ALPRAZolam Duanne Moron) 1 MG tablet Take 1 tablet (1 mg total) by mouth 3 (three) times daily as needed for anxiety. 08/22/13  Yes Blanchie Serve, MD  buPROPion (WELLBUTRIN XL) 150 MG 24 hr tablet Take 450 mg by mouth daily.   Yes [provider]  carvedilol (COREG) 12.5 MG tablet TAKE 1 TABLET BY MOUTH TWICE A DAY Patient taking differently: Take 12.5 mg by mouth 2 (two) times daily with a meal.  02/26/19  Yes Josue Hector, MD  citalopram (CELEXA) 40 MG tablet Take 40 mg by mouth daily.   Yes [provider]  clobetasol cream (TEMOVATE) 5.42 % Apply 1 application topically 2 (two) times daily as needed (paronychia).  04/08/19  Yes [provider]  ENTRESTO 49-51 MG TAKE 1 TABLET BY MOUTH TWICE A DAY Patient taking differently: Take 1 tablet by mouth 2 (two) times daily.  02/26/19  Yes Josue Hector, MD  esomeprazole (NEXIUM) 20 MG capsule Take 40 mg by mouth daily.    Yes [provider]  folic acid (FOLVITE) 1 MG tablet Take 1 mg by mouth daily.    Yes [provider]  gabapentin (NEURONTIN) 400 MG capsule Take 400 mg by mouth at bedtime as needed (pain).  04/08/19  Yes [provider]  meloxicam (MOBIC) 15 MG tablet Take 15 mg by mouth daily.  04/08/19  Yes [provider]  metFORMIN (GLUCOPHAGE-XR) 500 MG 24 hr tablet Take 500 mg by mouth daily with  breakfast.  10/03/17  Yes [provider]  methotrexate (50 MG/ML) 1 g injection Inject into the vein once a week.    Yes [provider]  Multiple Vitamin (MULTIVITAMIN) tablet Take 2 tablets by mouth daily.    Yes [provider]  mycophenolate (CELLCEPT) 500 MG tablet Take 1,500 mg by mouth 2 (two) times daily.  06/05/17  Yes [provider]  polyethylene glycol (MIRALAX / GLYCOLAX) packet Take 17 g by mouth daily as needed for moderate constipation (constipation).    Yes [provider]  dextromethorphan (DELSYM) 30 MG/5ML liquid Take 15 mg by mouth 2 (two) times daily as needed for cough.     [provider]  Fluticasone-Salmeterol (ADVAIR) 100-50 MCG/DOSE AEPB Inhale 1 puff into the lungs 2 (two) times daily as needed (asthma).     [provider]  oxyCODONE (OXY IR/ROXICODONE) 5 MG immediate release tablet Take 5 mg by mouth every 6 (six) hours as needed for pain. 05/28/19   [provider]  phentermine (ADIPEX-P) 37.5 MG tablet Take 37.5 mg by mouth daily. 04/06/18   [provider]  progesterone (PROMETRIUM) 200 MG capsule Take 200 mg by mouth at bedtime. 05/28/19   [provider]     Vital Signs: BP (!) 163/87 (BP Location: Left Arm)   Pulse 83   Temp 98 F (36.7 C) (Oral)   Resp 19   Ht 5\' 8"  (1.727 m)  Wt (!) 396 lb 13.3 oz (180 kg)   SpO2 91%   BMI 60.34 kg/m   Physical Exam Vitals reviewed.  Skin:    General: Skin is warm and dry.     Comments: Site is clean and dry NT Minimal redness directly at skin site--- no induration; no weeping--- dry  OP in bulb 20 cc yesterday 10 cc in bulb now- purulent liquid   Neurological:     Mental Status: She is alert.     Imaging: No results found.  Labs:  CBC: Recent Labs    06/19/19 0446 06/21/19 0406 06/23/19 0255 06/26/19 0414  WBC 14.3* 15.2* 14.6* 11.7*  HGB 9.7* 9.4* 9.8* 9.1*  HCT 31.8* 31.5* 31.9* 30.4*  PLT 306 271 247  231    COAGS: Recent Labs    06/13/19 1015  INR 1.3*    BMP: Recent Labs    06/27/19 0452 06/28/19 0307 06/29/19 0240 06/30/19 0221  NA 139 139 139 141  K 3.3* 3.6 3.6 3.5  CL 103 106 105 106  CO2 25 24 26 26   GLUCOSE 103* 88 113* 138*  BUN 15 12 12 13   CALCIUM 7.9* 8.0* 8.3* 8.3*  CREATININE 1.41* 1.15* 1.07* 1.08*  GFRNONAA 41* 52* 57* 57*  GFRAA 47* >60 >60 >60    LIVER FUNCTION TESTS: Recent Labs    06/12/19 0209 06/12/19 0209 06/13/19 0321 06/14/19 0121 06/17/19 0221 06/17/19 0221 06/18/19 0446 06/20/19 1104 06/27/19 0452 06/28/19 0307 06/29/19 0240 06/30/19 0221  BILITOT 1.4*  --  1.1  --  0.3  --  0.5  --   --   --   --   --   AST 14*  --  21  --  29  --  38  --   --   --   --   --   ALT 12  --  15  --  22  --  22  --   --   --   --   --   ALKPHOS 89  --  94  --  100  --  102  --   --   --   --   --   PROT 5.9*  --  6.4*  --  5.8*  --  5.7*  --   --   --   --   --   ALBUMIN 2.9*   < > 2.9*   < > 2.4*   < > 2.2*   < > 2.4* 2.6* 2.6* 2.6*   < > = values in this interval not displayed.    Assessment and Plan:  Plan for DC soon per pt If so-- please continue to flush drain 1-2x/day-- 5-10 cc sterile saline Record OP Pt will hear from [facility] for OP follow up CT and drain evaluation   Electronically Signed: Lavonia Drafts, PA-C 06/30/2019, 8:56 AM   I spent a total of 15 Minutes at the the patient's bedside AND on the patient's hospital floor or unit, greater than 50% of which was counseling/coordinating care for drain evaluation

## 2019-06-30 NOTE — Discharge Summary (Addendum)
Physician Discharge Summary  [name] EHU:[id number] DOB: 06/26/1960 DOA: 06/11/2019  PCP: Christain Sacramento, MD  Admit date: 06/11/2019 Discharge date: 06/30/2019  Admitted From: Home Disposition:  Snf  Recommendations for Outpatient Follow-up:  1. Follow up with ID in 1-2 weeks, she will go home on Flagyl and Levaquin for 3 weeks. 2. Please obtain BMP/CBC in one week 3. She will continue to follow with drain clinic with IR as an outpatient in 1 week.  Home Health:No Equipment/Devices:None  Discharge Condition:Stable CODE STATUS:Full Diet recommendation: Heart Healthy   Brief/Interim Summary: 59 y.o. female past medical history of prior sigmoid diverticulitis with perforation in 2014 history of ILD diabetes mellitus type 2 comes into the hospital for abdominal pain in the ED CT scan showed sigmoid diverticulitis  Discharge Diagnoses:  Principal Problem:   Colonic diverticular abscess Active Problems:   Obesity (BMI 30-39.9)   ILD (interstitial lung disease) ([place])   Hypertension   Acute diverticulitis   Acute kidney injury (AKI) with acute tubular necrosis (ATN) (HCC)   Pressure injury of skin   Bacteremia due to bacteroides species   Candidal intertrigo  Sepsis due to acute diverticulitis: Surgery was consulted who recommended to consult IR who placed a 16 French drain on 06/14/2019. He was started empirically on antibiotics ID was consulted and agree with treatment culture data was inconclusive. After 2 weeks of treatment, CT scan was repeated that showed an large diverticular abscess, the drain was changed it was placed to suction. She continues to improve clinically. ID recommended to change to antibiotics to oral Augmentin which should continue for the 3 weeks and follow-up with them as an outpatient.  Orthostatic hypotension: Likely deconditioning her blood pressure medications were held, she started working with physical therapy and her blood pressure  improved.  Acute kidney injury leading to ATN: In the setting of hypotension contrast-induced nephropathy and Entresto. Nephrology was consulted to perform hemodialysis on her for about 5 days her creatinine improve her urine output also started to improve. She was started on low-dose Lasix which will after 1 week her creatinine returned to baseline. She will resume Entresto in 2 weeks.  History of ANCA vasculitis: Also by methotrexate were held on admission due to her sepsis and acute diverticulitis. She will follow up with PCP will resume them as an outpatient.  Controlled diabetes mellitus type 2: With an A1c of 7.0 continue current medications no changes made.  Hypokalemia: Replete orally now resolved.  Essential hypertension: Her Delene Loll was held on admission she will resume this in 3 weeks due to acute kidney injury. She will continue Coreg her blood pressure has been well controlled in-house with Coreg.    Discharge Instructions  Discharge Instructions    Diet - low sodium heart healthy   Complete by: As directed    Increase activity slowly   Complete by: As directed    No wound care   Complete by: As directed      Allergies as of 06/30/2019      Reactions   Guaifenesin Nausea And Vomiting, Other (See Comments)   un   Mucinex [guaifenesin Er] Nausea And Vomiting   Sulfa Antibiotics Nausea And Vomiting      Medication List    STOP taking these medications   Fluticasone-Salmeterol 100-50 MCG/DOSE Aepb Commonly known as: ADVAIR   meloxicam 15 MG tablet Commonly known as: MOBIC   phentermine 37.5 MG tablet Commonly known as: ADIPEX-P     TAKE these medications  ALPRAZolam 1 MG tablet Commonly known as: XANAX Take 1 tablet (1 mg total) by mouth 3 (three) times daily as needed for anxiety.   buPROPion 150 MG 24 hr tablet Commonly known as: WELLBUTRIN XL Take 450 mg by mouth daily.   carvedilol 12.5 MG tablet Commonly known as: COREG TAKE 1 TABLET  BY MOUTH TWICE A DAY What changed: when to take this   citalopram 40 MG tablet Commonly known as: CELEXA Take 40 mg by mouth daily.   clobetasol cream 0.05 % Commonly known as: TEMOVATE Apply 1 application topically 2 (two) times daily as needed (paronychia).   dextromethorphan 30 MG/5ML liquid Commonly known as: DELSYM Take 15 mg by mouth 2 (two) times daily as needed for cough.   Entresto 49-51 MG Generic drug: sacubitril-valsartan Take 1 tablet by mouth 2 (two) times daily. Start taking on: July 07, 2019 What changed: These instructions start on July 07, 2019. If you are unsure what to do until then, ask your doctor or other care provider.   esomeprazole 20 MG capsule Commonly known as: NEXIUM Take 40 mg by mouth daily.   folic acid 1 MG tablet Commonly known as: FOLVITE Take 1 mg by mouth daily.   gabapentin 400 MG capsule Commonly known as: NEURONTIN Take 400 mg by mouth at bedtime as needed (pain).   levofloxacin 250 MG tablet Commonly known as: LEVAQUIN Take 1 tablet (250 mg total) by mouth daily for 21 days.   metFORMIN 500 MG 24 hr tablet Commonly known as: GLUCOPHAGE-XR Take 500 mg by mouth daily with breakfast.   methotrexate 1 g injection Commonly known as: 50 mg/ml Inject into the vein once a week.   metroNIDAZOLE 500 MG tablet Commonly known as: FLAGYL Take 1 tablet (500 mg total) by mouth every 8 (eight) hours for 21 days.   multivitamin tablet Take 2 tablets by mouth daily.   mycophenolate 500 MG tablet Commonly known as: CELLCEPT Take 1,500 mg by mouth 2 (two) times daily.   oxyCODONE 5 MG immediate release tablet Commonly known as: Oxy IR/ROXICODONE Take 5 mg by mouth every 6 (six) hours as needed for pain.   polyethylene glycol 17 g packet Commonly known as: MIRALAX / GLYCOLAX Take 17 g by mouth daily as needed for moderate constipation (constipation).   progesterone 200 MG capsule Commonly known as: PROMETRIUM Take 200 mg by mouth  at bedtime.       Contact information for follow-up providers    Arne Cleveland, MD Follow up.   Specialties: Interventional Radiology, Radiology Why: IR scheduler will call you with appointment date and time (10-14 days after discharge). Please call with questions or concerns. Contact information: [address] [place] [address] [phone]        Clovis Riley, MD Follow up in 3 week(s).   Specialty: General Surgery Why: our office is working on follow up currently Contact information: [address] [place] [address] [phone]            Contact information for after-discharge care    Destination    [facility] .   Service: Skilled Nursing Contact information: [place] [phone]                 Allergies  Allergen Reactions  . Guaifenesin Nausea And Vomiting and Other (See Comments)    un  . Mucinex [Guaifenesin Er] Nausea And Vomiting  . Sulfa  Antibiotics Nausea And Vomiting    Consultations:  Nephrology  Interventional radiology  General surgery   Procedures/Studies: CT ABDOMEN PELVIS WO CONTRAST  Result Date: 06/20/2019 CLINICAL DATA:  Diverticular abscess follow-up. EXAM: CT ABDOMEN AND PELVIS WITHOUT CONTRAST TECHNIQUE: Multidetector CT imaging of the abdomen and pelvis was performed following the standard protocol without IV contrast. COMPARISON:  CT abdomen pelvis dated June 12, 2019. FINDINGS: Lower chest: No acute abnormality. Hepatobiliary: No focal liver abnormality is seen. Unchanged hepatic steatosis. The gallbladder is decompressed. No biliary dilatation. Pancreas: Unremarkable. No pancreatic ductal dilatation or surrounding inflammatory changes. Spleen: Unchanged mild splenomegaly.  No focal abnormality. Adrenals/Urinary Tract: Adrenal glands are unremarkable. Kidneys are normal, without renal calculi, focal lesion, or hydronephrosis.  Bladder is decompressed by a Foley catheter. Stomach/Bowel: Wall thickening of and inflammatory stranding surrounding the mid sigmoid colon have improved. New right lower quadrant pigtail drainage catheter within a now more organized gas and fluid collection in the right lower quadrant measuring 12.0 x 8.1 x 11.9 cm. This is increased in size when compared to the prior study. There is a new second smaller gas and fluid collection just superior to and separate from the bladder, measuring 5.0 x 3.8 cm (series 3, image 83). The stomach and small bowel are unremarkable. Vascular/Lymphatic: No significant vascular findings are present. No enlarged abdominal or pelvic lymph nodes. Reproductive: Uterus and bilateral adnexa are unremarkable. Other: Trace free fluid in the pelvis. Small amount of subcutaneous emphysema in the right lower abdominal wall around the pigtail drainage catheter. Musculoskeletal: No acute or significant osseous findings. IMPRESSION: 1. New right lower quadrant pigtail drainage catheter within a now more organized gas and fluid collection in the right lower quadrant measuring 12.0 x 8.1 x 11.9 cm, larger in size when compared to the prior study. 2. New second smaller gas and fluid collection just superior to and separate from the bladder, measuring 5.0 x 3.8 cm. 3. Decreasing sigmoid inflammatory changes. 4. Unchanged hepatic steatosis. Electronically Signed   By: Titus Dubin M.D.   On: 06/20/2019 15:07   CT ABDOMEN PELVIS WO CONTRAST  Result Date: 06/12/2019 CLINICAL DATA:  Worsening abdominal pain, elevated white blood cell count, history of acute sigmoid diverticulitis EXAM: CT ABDOMEN AND PELVIS WITHOUT CONTRAST TECHNIQUE: Multidetector CT imaging of the abdomen and pelvis was performed following the standard protocol without IV contrast. COMPARISON:  06/11/2019 FINDINGS: Lower chest: No acute pleural or parenchymal lung disease. Hepatobiliary: No focal liver abnormality is seen. No  gallstones, gallbladder wall thickening, or biliary dilatation. Respiratory motion limits evaluation. Pancreas: Unremarkable. No pancreatic ductal dilatation or surrounding inflammatory changes. Respiratory motion limits evaluation. Spleen: Normal in size without focal abnormality. Adrenals/Urinary Tract: Adrenal glands are unremarkable. Kidneys are normal, without renal calculi, focal lesion, or hydronephrosis. Bladder is unremarkable. Stomach/Bowel: There is continued wall thickening of the mid sigmoid colon, with significant surrounding mesenteric inflammatory change. Punctate foci gas are seen adjacent to the inflamed sigmoid colon. There is increasing fluid in the right lower quadrant with superimposed gas fluid level compatible with perforation. No organized fluid collection, rim enhancement, or abscess. Vascular/Lymphatic: No significant vascular findings are present. No enlarged abdominal or pelvic lymph nodes. Reproductive: Uterus and bilateral adnexa are unremarkable. Other: Free fluid in the pelvis with gas fluid level consistent with perforation. No organized fluid collection or abscess. No abdominal wall hernia. Musculoskeletal: No acute or destructive bony lesions. Reconstructed images demonstrate no additional findings. IMPRESSION: 1. Progression of acute sigmoid diverticulitis, now with evidence of  perforation. Increasing fluid in the right lower quadrant with gas fluid level. No rim enhancement to suggest organized fluid collection or abscess at this time. These results were called by telephone at the time of interpretation on 06/12/2019 at 3:31 pm to provider [facility] , who verbally acknowledged these results. Electronically Signed   By: Randa Ngo M.D.   On: 06/12/2019 15:41   DG Chest 1 View  Result Date: 06/11/2019 CLINICAL DATA:  59 year old female with history of shortness of breath. Abdominal cramping. EXAM: CHEST  1 VIEW COMPARISON:  Chest x-ray 05/23/2019. FINDINGS: Lung volumes  are normal. No consolidative airspace disease. No pleural effusions. No pneumothorax. No pulmonary nodule or mass noted. Pulmonary vasculature and the cardiomediastinal silhouette are within normal limits. IMPRESSION: No radiographic evidence of acute cardiopulmonary disease. Electronically Signed   By: Vinnie Langton M.D.   On: 06/11/2019 17:25   CT ABDOMEN PELVIS W CONTRAST  Result Date: 06/11/2019 CLINICAL DATA:  Abdominal pain EXAM: CT ABDOMEN AND PELVIS WITH CONTRAST TECHNIQUE: Multidetector CT imaging of the abdomen and pelvis was performed using the standard protocol following bolus administration of intravenous contrast. CONTRAST:  116mL OMNIPAQUE IOHEXOL 300 MG/ML  SOLN COMPARISON:  2014 FINDINGS: Lower chest: No acute abnormality. Hepatobiliary: Possible hepatic steatosis. Gallbladder is unremarkable Pancreas: Unremarkable. Spleen: Unremarkable. Adrenals/Urinary Tract: Adrenals, kidneys, and bladder are unremarkable. Stomach/Bowel: Stomach is within normal limits. Bowel is normal in caliber. There are inflammatory changes about the sigmoid colon. Sigmoid diverticulosis is present. Appendix is not definitely visualized. Vascular/Lymphatic: No significant vascular findings are present. No enlarged abdominal or pelvic lymph nodes. Reproductive: Uterus and bilateral adnexa are unremarkable. Other: Small volume free fluid.  Abdominal wall is unremarkable. Musculoskeletal: Lumbar spine degenerative changes. There is no acute osseous abnormality. IMPRESSION: Acute sigmoid diverticulitis. Small volume free fluid no evidence of abscess. Electronically Signed   By: Macy Mis M.D.   On: 06/11/2019 17:05   IR Catheter Tube Change  Result Date: 06/24/2019 INDICATION: Presumed diverticular abscess, status post 12 French pigtail drain catheter placement 06/13/2019. Follow-up CT shows continued enlargement of the collection. Drain revision is requested. EXAM: EXCHANGE/UPSIZING PELVIC DRAIN CATHETER UNDER  FLUOROSCOPIC GUIDANCE MEDICATIONS: The patient is currently admitted to the hospital and receiving intravenous antibiotics. The antibiotics were administered within an appropriate time frame prior to the initiation of the procedure. ANESTHESIA/SEDATION: Intravenous Fentanyl 43mcg and Versed 1mg  were administered as conscious sedation during continuous monitoring of the patient's level of consciousness and physiological / cardiorespiratory status by the radiology RN, with a total moderate sedation time of 10 minutes. COMPLICATIONS: None immediate. PROCEDURE: Informed written consent was obtained from the patient after a thorough discussion of the procedural risks, benefits and alternatives. All questions were addressed. Maximal Sterile Barrier Technique was utilized including caps, mask, sterile gowns, sterile gloves, sterile drape, hand hygiene and skin antiseptic. A timeout was performed prior to the initiation of the procedure. Skin surrounding the previously placed drain was infiltrated with 1% lidocaine. Small contrast injection opacified the right lower quadrant collection freely. The previously placed catheter was cut and exchanged over a short Amplatz wire for a 16 French pigtail drain catheter, formed centrally within the collection. Contrast injection confirmed appropriate positioning and patency although the pigtail would not fully lock. The catheter was secured externally with 0 Prolene suture and StatLock and placed to gravity drain bag. The patient tolerated the procedure well. IMPRESSION: 1. Technically successful exchange and upsizing of right lower quadrant drain to a 16 [place] device. Electronically Signed  By: Lucrezia Europe M.D.   On: 06/24/2019 16:24   IR Fluoro Guide CV Line Right  Result Date: 06/16/2019 INDICATION: Acute kidney injury, access for temporary dialysis EXAM: ULTRASOUND GUIDANCE FOR VASCULAR ACCESS RIGHT IJ TEMPORARY DIALYSIS CATHETER MEDICATIONS: 1% LIDOCAINE LOCAL  ANESTHESIA/SEDATION: Moderate Sedation Time: None. The patient's level of consciousness and vital signs were monitored continuously by radiology nursing throughout the procedure under my direct supervision. FLUOROSCOPY TIME:  Fluoroscopy Time: 0 minutes 24 seconds (12 mGy). COMPLICATIONS: None immediate. PROCEDURE: Informed written consent was obtained from the patient after a thorough discussion of the procedural risks, benefits and alternatives. All questions were addressed. Maximal Sterile Barrier Technique was utilized including caps, mask, sterile gowns, sterile gloves, sterile drape, hand hygiene and skin antiseptic. A timeout was performed prior to the initiation of the procedure. Under sterile conditions and local anesthesia, ultrasound micropuncture access performed of the right internal jugular vein. Images obtained for documentation of the patent right internal jugular vein with ultrasound. Guidewire advanced followed by the micro dilator. Amplatz guidewire inserted. Tract dilatation performed to insert a 16 cm Mahurkar catheter. Tip SVC RA junction. Images obtained for documentation. Catheter secured Prolene sutures. Blood aspirated easily followed by saline and heparin flushes. Appropriate volume strength of heparin instilled in all 3 lumens followed by external caps. Sterile dressing applied. No immediate complication. Patient tolerated the procedure well. IMPRESSION: Successful ultrasound and fluoroscopic right IJ temporary dialysis catheter. Tip SVC RA junction. Ready for use. Electronically Signed   By: Jerilynn Mages.  Shick M.D.   On: 06/16/2019 13:20   IR US Guide Vasc Access Right  Result Date: 06/16/2019 INDICATION: Acute kidney injury, access for temporary dialysis EXAM: ULTRASOUND GUIDANCE FOR VASCULAR ACCESS RIGHT IJ TEMPORARY DIALYSIS CATHETER MEDICATIONS: 1% LIDOCAINE LOCAL ANESTHESIA/SEDATION: Moderate Sedation Time: None. The patient's level of consciousness and vital signs were monitored  continuously by radiology nursing throughout the procedure under my direct supervision. FLUOROSCOPY TIME:  Fluoroscopy Time: 0 minutes 24 seconds (12 mGy). COMPLICATIONS: None immediate. PROCEDURE: Informed written consent was obtained from the patient after a thorough discussion of the procedural risks, benefits and alternatives. All questions were addressed. Maximal Sterile Barrier Technique was utilized including caps, mask, sterile gowns, sterile gloves, sterile drape, hand hygiene and skin antiseptic. A timeout was performed prior to the initiation of the procedure. Under sterile conditions and local anesthesia, ultrasound micropuncture access performed of the right internal jugular vein. Images obtained for documentation of the patent right internal jugular vein with ultrasound. Guidewire advanced followed by the micro dilator. Amplatz guidewire inserted. Tract dilatation performed to insert a 16 cm Mahurkar catheter. Tip SVC RA junction. Images obtained for documentation. Catheter secured Prolene sutures. Blood aspirated easily followed by saline and heparin flushes. Appropriate volume strength of heparin instilled in all 3 lumens followed by external caps. Sterile dressing applied. No immediate complication. Patient tolerated the procedure well. IMPRESSION: Successful ultrasound and fluoroscopic right IJ temporary dialysis catheter. Tip SVC RA junction. Ready for use. Electronically Signed   By: Jerilynn Mages.  Shick M.D.   On: 06/16/2019 13:20   DG CHEST PORT 1 VIEW  Result Date: 06/15/2019 CLINICAL DATA:  Expiratory wheezes this morning. EXAM: PORTABLE CHEST 1 VIEW COMPARISON:  06/11/2019 FINDINGS: Heart is mildly enlarged. There is mild perihilar infiltrate consistent with mild edema. More focal opacity in the MEDIAL LEFT lung base may represent atelectasis or developing infiltrate. IMPRESSION: 1. Cardiomegaly and mild edema. 2. MEDIAL LEFT lower lobe atelectasis or infiltrate. Electronically Signed   By:  Benjamine Mola  Owens Shark M.D.   On: 06/15/2019 11:21   CT IMAGE GUIDED DRAINAGE BY PERCUTANEOUS CATHETER  Result Date: 06/13/2019 CLINICAL DATA:  Worsening diverticular abscess. EXAM: CT GUIDED DRAINAGE OF PELVIC ABSCESS ANESTHESIA/SEDATION: Intravenous Fentanyl 55mcg and Versed 1mg  were administered as conscious sedation during continuous monitoring of the patient's level of consciousness and physiological / cardiorespiratory status by the radiology RN, with a total moderate sedation time of 28 minutes. PROCEDURE: The procedure, risks, benefits, and alternatives were explained to the patient. Questions regarding the procedure were encouraged and answered. The patient understands and consents to the procedure. Select axial scans through the pelvis were obtained. The right pelvic fluid collection was localized and an appropriate skin entry site was determined and marked. The operative field was prepped with chlorhexidinein a sterile fashion, and a sterile drape was applied covering the operative field. A sterile gown and sterile gloves were used for the procedure. Local anesthesia was provided with 1% Lidocaine. Under CT fluoroscopic and helical CT guidance, 18 gauge trocar needle advanced into the collection. Thin cloudy fluid could be aspirated. Amplatz guidewire advanced easily within the collection, position confirmed on CT. Tract dilated to facilitate placement of a 12 French pigtail drain catheter, formed centrally within the collection. Position confirmed on CT. 22 mL cloudy yellow aspirate sent for Gram stain and culture. Catheter secured externally with 0 Prolene suture and StatLock and placed to gravity drain bag. The patient tolerated the procedure well. COMPLICATIONS: None immediate FINDINGS: Right pelvic gas and fluid collection was localized corresponding to see previous CT findings. 12 French pigtail drain catheter placed as above. 22 mL cloudy yellow aspirate sent for Gram stain and culture. IMPRESSION:  Technically successful CT-guided pelvic abscess drain catheter placement . Electronically Signed   By: Lucrezia Europe M.D.   On: 06/13/2019 16:18     Subjective: No complaints.  Discharge Exam: Vitals:   06/30/19 0308 06/30/19 0742  BP: (!) 142/65 (!) 163/87  Pulse: 88 83  Resp: 20 19  Temp: 98.7 F (37.1 C) 98 F (36.7 C)  SpO2: 92% 91%   Vitals:   06/29/19 1955 06/29/19 2314 06/30/19 0308 06/30/19 0742  BP: (!) 158/87 (!) 152/82 (!) 142/65 (!) 163/87  Pulse: 84 84 88 83  Resp: 19 20 20 19   Temp: 98.7 F (37.1 C) 98.5 F (36.9 C) 98.7 F (37.1 C) 98 F (36.7 C)  TempSrc: Oral Oral Oral Oral  SpO2: 92% 92% 92% 91%  Weight:      Height:        General: Pt is alert, awake, not in acute distress Cardiovascular: RRR, S1/S2 +, no rubs, no gallops Respiratory: CTA bilaterally, no wheezing, no rhonchi Abdominal: Soft, NT, ND, bowel sounds + Extremities: no edema, no cyanosis    The results of significant diagnostics from this hospitalization (including imaging, microbiology, ancillary and laboratory) are listed below for reference.     Microbiology: No results found for this or any previous visit (from the past 240 hour(s)).   Labs: BNP (last 3 results) No results for input(s): BNP in the last 8760 hours. Basic Metabolic Panel: Recent Labs  Lab 06/26/19 1433 06/27/19 0452 06/28/19 0307 06/29/19 0240 06/30/19 0221  NA 140  140 139 139 139 141  K 3.3*  3.4* 3.3* 3.6 3.6 3.5  CL 106  105 103 106 105 106  CO2 23  23 25 24 26 26   GLUCOSE 143*  140* 103* 88 113* 138*  BUN 14  14 15 12 12  13  CREATININE 1.47*  1.51* 1.41* 1.15* 1.07* 1.08*  CALCIUM 7.8*  7.8* 7.9* 8.0* 8.3* 8.3*  PHOS 3.4 3.8 2.9 2.5 2.5   Liver Function Tests: Recent Labs  Lab 06/26/19 1433 06/27/19 0452 06/28/19 0307 06/29/19 0240 06/30/19 0221  ALBUMIN 2.5* 2.4* 2.6* 2.6* 2.6*   No results for input(s): LIPASE, AMYLASE in the last 168 hours. No results for input(s): AMMONIA in  the last 168 hours. CBC: Recent Labs  Lab 06/26/19 0414  WBC 11.7*  HGB 9.1*  HCT 30.4*  MCV 87.6  PLT 231   Cardiac Enzymes: No results for input(s): CKTOTAL, CKMB, CKMBINDEX, TROPONINI in the last 168 hours. BNP: Invalid input(s): POCBNP CBG: Recent Labs  Lab 06/29/19 1612 06/29/19 2003 06/29/19 2348 06/30/19 0313 06/30/19 0739  GLUCAP 148* 135* 145* 121* 103*   D-Dimer No results for input(s): DDIMER in the last 72 hours. Hgb A1c No results for input(s): HGBA1C in the last 72 hours. Lipid Profile No results for input(s): CHOL, HDL, LDLCALC, TRIG, CHOLHDL, LDLDIRECT in the last 72 hours. Thyroid function studies No results for input(s): TSH, T4TOTAL, T3FREE, THYROIDAB in the last 72 hours.  Invalid input(s): FREET3 Anemia work up No results for input(s): VITAMINB12, FOLATE, FERRITIN, TIBC, IRON, RETICCTPCT in the last 72 hours. Urinalysis    Component Value Date/Time   COLORURINE YELLOW 06/15/2019 1212   APPEARANCEUR TURBID (A) 06/15/2019 1212   LABSPEC >1.030 (H) 06/15/2019 1212   PHURINE 5.0 06/15/2019 1212   GLUCOSEU NEGATIVE 06/15/2019 1212   HGBUR LARGE (A) 06/15/2019 1212   BILIRUBINUR MODERATE (A) 06/15/2019 1212   KETONESUR 15 (A) 06/15/2019 1212   PROTEINUR 100 (A) 06/15/2019 1212   UROBILINOGEN 0.2 07/09/2013 1220   NITRITE NEGATIVE 06/15/2019 1212   LEUKOCYTESUR TRACE (A) 06/15/2019 1212   Sepsis Labs Invalid input(s): PROCALCITONIN,  WBC,  LACTICIDVEN Microbiology No results found for this or any previous visit (from the past 240 hour(s)).   Time coordinating discharge: Over 40 minutes  SIGNED:   Charlynne Cousins, MD  [facility] 06/30/2019, 9:31 AM Pager   If 7PM-7AM, please contact night-coverage [email or web address] Password [email or web address]

## 2019-07-01 ENCOUNTER — Other Ambulatory Visit: Payer: Self-pay | Admitting: Surgery

## 2019-07-01 DIAGNOSIS — K5732 Diverticulitis of large intestine without perforation or abscess without bleeding: Secondary | ICD-10-CM

## 2019-07-01 DIAGNOSIS — R188 Other ascites: Secondary | ICD-10-CM

## 2019-07-15 ENCOUNTER — Other Ambulatory Visit: Payer: Self-pay

## 2019-07-15 ENCOUNTER — Telehealth (INDEPENDENT_AMBULATORY_CARE_PROVIDER_SITE_OTHER): Payer: Commercial Managed Care - PPO | Admitting: Internal Medicine

## 2019-07-15 DIAGNOSIS — K5720 Diverticulitis of large intestine with perforation and abscess without bleeding: Secondary | ICD-10-CM | POA: Diagnosis not present

## 2019-07-15 NOTE — Progress Notes (Signed)
Virtual Visit via Telephone Note  I connected with [name] on 07/15/19 at  3:15 PM EDT by telephone and verified that I am speaking with the correct person using two identifiers.  Location: Patient: Home Provider: [facility]   I discussed the limitations, risks, security and privacy concerns of performing an evaluation and management service by telephone and the availability of in person appointments. I also discussed with the patient that there may be a patient responsible charge related to this service. The patient expressed understanding and agreed to proceed.   History of Present Illness: [name] was recently hospitalized with acute diverticulitis complicated by diverticular abscess and acute kidney injury.  She has now completed 35 days of therapy with levofloxacin and metronidazole.  She is tolerating her antibiotics well.  Her abscess drain remains in place but is not putting out nearly as much fluid.  She is not having any fever.  She is scheduled for a follow-up CT scan tomorrow.  She is now back home.   Observations/Objective:   Assessment and Plan: She seems to be improving clinically.  Tomorrow CT scan will help [place] decide if the drain can be removed and whether or not she can stop antibiotic therapy.  Follow Up Instructions: I told her that I will call her once I have the CT scan results.   I discussed the assessment and treatment plan with the patient. The patient was provided an opportunity to ask questions and all were answered. The patient agreed with the plan and demonstrated an understanding of the instructions.   The patient was advised to call back or seek an in-person evaluation if the symptoms worsen or if the condition fails to improve as anticipated.  I provided 15 minutes of non-face-to-face time during this encounter.   [name], MD

## 2019-07-16 ENCOUNTER — Other Ambulatory Visit: Payer: Commercial Managed Care - PPO

## 2019-07-29 ENCOUNTER — Ambulatory Visit
Admission: RE | Admit: 2019-07-29 | Discharge: 2019-07-29 | Disposition: A | Discharge status: Home / Self Care | Payer: Commercial Managed Care - PPO | Source: Ambulatory Visit | Attending: Surgery | Admitting: Surgery

## 2019-07-29 ENCOUNTER — Other Ambulatory Visit: Payer: Self-pay | Admitting: Internal Medicine

## 2019-07-29 ENCOUNTER — Ambulatory Visit
Admission: RE | Admit: 2019-07-29 | Discharge: 2019-07-29 | Disposition: A | Discharge status: Home / Self Care | Payer: Commercial Managed Care - PPO | Source: Ambulatory Visit | Attending: Physician Assistant | Admitting: Physician Assistant

## 2019-07-29 DIAGNOSIS — K5732 Diverticulitis of large intestine without perforation or abscess without bleeding: Secondary | ICD-10-CM

## 2019-07-29 DIAGNOSIS — R188 Other ascites: Secondary | ICD-10-CM

## 2019-07-29 HISTORY — PX: IR RADIOLOGIST EVAL & MGMT: IMG5224

## 2019-07-29 MED ORDER — IOPAMIDOL (ISOVUE-300) INJECTION 61%
125.0000 mL | Freq: Once | INTRAVENOUS | Status: AC | PRN
  Administered 2019-07-29: 125 mL via INTRAVENOUS

## 2019-07-29 NOTE — Progress Notes (Addendum)
Referring Physician(s): Dr Deland Pretty Dr Sherri Sear  Chief Complaint: The patient is seen in follow up today s/p 12 French pigtail drain catheter placed in IR by Dr Vernard Gambles 06/13/19 Exchanged/upsized to 16 Fr 6/15- secondary continued enlarging abscess  History of present illness:  Diverticular abscess OP has been minimal now for several days Has not flushed drain but maybe few times since DC 6/21.  Pt is unable to "get to drain area". Taking Levofloxacin and Flagyl daily Sees Dr Sherri Sear with ID-- he is to call her in a few days after he sees IR note per pt. She is also to hear from Dr Dema Severin in next few days for appt maybe 1 week from now.  Denies pain; fever; N/V  Here today for CT evaluation and drain injection    Past Medical History:  Diagnosis Date  . Acute blood loss anemia 06/23/2013  . 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 Anxiety associated with depression 09/23/2012  . At high risk for falls 09/23/2012   She has had a fractured ankle and tendon tear with falls over the last couple years.  . Cardiomyopathy ([place])   . Chronic fatigue 05/11/2014  . Chronic right-sided low back pain with right-sided sciatica 12/26/2016  . Colitis   . Constipation, chronic 09/27/2012  . Diabetes mellitus without complication ([place])   . 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 Diverticulitis of large intestine with perforation 09/23/2012  . Dyspnea 05/11/2014  . GERD (gastroesophageal reflux disease)   . Hemoptysis 06/23/2013  . History of endometriosis 1997  . Hx of tobacco use, presenting hazards to health 09/23/2012  . Hyperglycemia 07/24/2013  . Hypertension   . Hypokalemia 07/20/2013  . ILD (interstitial lung disease) ([place]) 01/22/2014  . Leukocytosis 05/11/2014  . MPA (microscopic polyangiitis) ([place]) 2015  . Numbness   . Obesity (BMI 30-39.9) 09/23/2012  . Paresthesia 12/26/2016  . Perforation of sigmoid colon - stercoral 09/27/2012  .  Physical deconditioning 04/30/2015  . 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  . Respiratory failure with hypoxia ([place]) 06/23/2013  . Right leg swelling 12/26/2016    Past Surgical History:  Procedure Laterality Date  . APPENDECTOMY    . BLADDER REPAIR    . DILATATION & CURETTAGE/HYSTEROSCOPY WITH MYOSURE N/A 06/03/2019   Procedure: DILATATION & CURETTAGE/HYSTEROSCOPY WITH MYOSURE;  Surgeon: Everlene Farrier, MD;  Location: [facility];  Service: Gynecology;  Laterality: N/A;  . IR CATHETER TUBE CHANGE  06/24/2019  . IR FLUORO GUIDE CV LINE RIGHT  06/16/2019  . IR RADIOLOGIST EVAL & MGMT  07/29/2019  . IR US GUIDE VASC ACCESS RIGHT  06/16/2019  . LAPAROSCOPY  1997   dx of endometriosis  . LAPAROSCOPY  04/28/2000    Laparoscopy with lysis of adhesions, hysteroscopy, D&C.  Marland Kitchen TONSILLECTOMY      Allergies: Guaifenesin, Mucinex [guaifenesin er], and Sulfa antibiotics  Medications: Prior to Admission medications   Medication Sig Start Date End Date Taking? Authorizing Provider  ALPRAZolam Duanne Moron) 1 MG tablet Take 1 tablet (1 mg total) by mouth 3 (three) times daily as needed for anxiety. 06/30/19   Charlynne Cousins, MD  buPROPion (WELLBUTRIN XL) 150 MG 24 hr tablet Take 450 mg by mouth daily.    [provider]  carvedilol (COREG) 12.5 MG tablet TAKE 1 TABLET BY MOUTH TWICE A DAY Patient taking differently: Take 12.5 mg by mouth 2 (two) times daily with a meal.  02/26/19   Josue Hector, MD  citalopram (CELEXA) 40 MG tablet Take 40 mg by mouth daily.    [provider]  clobetasol cream (TEMOVATE) 7.82 % Apply 1 application topically 2 (two) times daily as needed (paronychia).  04/08/19   [provider]  dextromethorphan (DELSYM) 30 MG/5ML liquid Take 15 mg by mouth 2 (two) times daily as needed for cough.     [provider]  esomeprazole (NEXIUM) 20 MG capsule Take 40 mg by mouth daily.     [provider]  folic acid (FOLVITE) 1 MG tablet Take 1 mg by  mouth daily.     [provider]  gabapentin (NEURONTIN) 400 MG capsule Take 400 mg by mouth at bedtime as needed (pain).  04/08/19   [provider]  metFORMIN (GLUCOPHAGE-XR) 500 MG 24 hr tablet Take 500 mg by mouth daily with breakfast.  10/03/17   [provider]  methotrexate (50 MG/ML) 1 g injection Inject 1 mL (50 mg total) into the vein once a week. Patient not taking: Reported on 07/15/2019 07/14/19   Charlynne Cousins, MD  Multiple Vitamin (MULTIVITAMIN) tablet Take 2 tablets by mouth daily.     [provider]  mycophenolate (CELLCEPT) 500 MG tablet Take 3 tablets (1,500 mg total) by mouth 2 (two) times daily. Patient not taking: Reported on 07/15/2019 07/14/19   Charlynne Cousins, MD  oxyCODONE (OXY IR/ROXICODONE) 5 MG immediate release tablet Take 5 mg by mouth every 6 (six) hours as needed for pain. 05/28/19   [provider]  polyethylene glycol (MIRALAX / GLYCOLAX) packet Take 17 g by mouth daily as needed for moderate constipation (constipation).     [provider]  progesterone (PROMETRIUM) 200 MG capsule Take 200 mg by mouth at bedtime. Patient not taking: Reported on 07/15/2019 05/28/19   [provider]  sacubitril-valsartan (ENTRESTO) 49-51 MG Take 1 tablet by mouth 2 (two) times daily. 07/07/19   Charlynne Cousins, MD     Family History  Problem Relation Age of Onset  . Diabetes Mother        AODM  . Diabetes Father        AODM  . Hypertension Father   . Obesity Father   . Breast cancer Paternal Grandmother   . Birth defects Cousin   . CAD Other        [facility]    Social History   Socioeconomic History  . Marital status: Married    Spouse name: Not on file  . Number of children: 0  . Years of education: 68  . Highest education level: High school graduate  Occupational History  . Occupation: Disabled  Tobacco Use  . Smoking status: Former Smoker    Packs/day: 1.00    Years: 27.00    Pack years: 27.00     Quit date: 01/09/2001    Years since quitting: 18.5  . Smokeless tobacco: Never Used  Vaping Use  . Vaping Use: Never used  Substance and Sexual Activity  . Alcohol use: No  . Drug use: No  . Sexual activity: Not on file  Other Topics Concern  . Not on file  Social History Narrative   Lives at home with her husband.   Right-handed.   1 cup caffeine per day, occasionally more.   Social Determinants of Health   Financial Resource Strain:   . Difficulty of Paying Living Expenses:   Food Insecurity:   . Worried About Charity fundraiser in the Last Year:   .  Ran Out of Food in the Last Year:   Transportation Needs:   . Film/video editor (Medical):   Marland Kitchen Lack of Transportation (Non-Medical):   Physical Activity:   . Days of Exercise per Week:   . Minutes of Exercise per Session:   Stress:   . Feeling of Stress :   Social Connections:   . Frequency of Communication with Friends and Family:   . Frequency of Social Gatherings with Friends and Family:   . Attends Religious Services:   . Active Member of Clubs or Organizations:   . Attends Archivist Meetings:   Marland Kitchen Marital Status:      Vital Signs: BP (!) 107/59   Pulse 72   Temp 98.3 F (36.8 C)   SpO2 97%   Physical Exam Vitals reviewed.  Skin:    General: Skin is warm.     Comments: Site is clean and dry Immediately at site is reddened NT no bleeding No infection seen  Tubing is intact Sutures intact  OP in JP is serous color  Injection reveals NO fistula per Dr Charletta Cousin removed without complication Dressing placed   CT today shows resolution of abscess per Dr Pascal Lux  Imaging: IR Radiologist Eval & Mgmt  Result Date: 07/29/2019 Please refer to notes tab for details about interventional procedure. (Op Note)   Labs:  CBC: Recent Labs    06/19/19 0446 06/21/19 0406 06/23/19 0255 06/26/19 0414  WBC 14.3* 15.2* 14.6* 11.7*  HGB 9.7* 9.4* 9.8* 9.1*  HCT 31.8* 31.5* 31.9* 30.4*    PLT 306 271 247 231    COAGS: Recent Labs    06/13/19 1015  INR 1.3*    BMP: Recent Labs    06/27/19 0452 06/28/19 0307 06/29/19 0240 06/30/19 0221  NA 139 139 139 141  K 3.3* 3.6 3.6 3.5  CL 103 106 105 106  CO2 25 24 26 26   GLUCOSE 103* 88 113* 138*  BUN 15 12 12 13   CALCIUM 7.9* 8.0* 8.3* 8.3*  CREATININE 1.41* 1.15* 1.07* 1.08*  GFRNONAA 41* 52* 57* 57*  GFRAA 47* >60 >60 >60    LIVER FUNCTION TESTS: Recent Labs    06/12/19 0209 06/12/19 0209 06/13/19 0321 06/14/19 0121 06/17/19 0221 06/17/19 0221 06/18/19 0446 06/20/19 1104 06/27/19 0452 06/28/19 0307 06/29/19 0240 06/30/19 0221  BILITOT 1.4*  --  1.1  --  0.3  --  0.5  --   --   --   --   --   AST 14*  --  21  --  29  --  38  --   --   --   --   --   ALT 12  --  15  --  22  --  22  --   --   --   --   --   ALKPHOS 89  --  94  --  100  --  102  --   --   --   --   --   PROT 5.9*  --  6.4*  --  5.8*  --  5.7*  --   --   --   --   --   ALBUMIN 2.9*   < > 2.9*   < > 2.4*   < > 2.2*   < > 2.4* 2.6* 2.6* 2.6*   < > = values in this interval not displayed.    Assessment:  Diverticular abscess Drain placed 6/4 and  upsized 06/24/19 CT today revealing resolution of abscess Drain injection revealing NO fistula to bowel Drain removed without complication To follow up with Dr Dema Severin and Dr Megan Salon Pt has good understanding of plan   Signed: Lavonia Drafts, PA-C 07/29/2019, 2:49 PM   Please refer to Dr. Pascal Lux attestation of this note for management and plan.

## 2019-08-14 ENCOUNTER — Telehealth: Payer: Self-pay

## 2019-08-14 NOTE — Telephone Encounter (Signed)
Patient states she feel like her loose stools are returning. Today she has had x3 loose to watery stools and has only ate half of a chicken salad sandwich. Patient recently completed antibiotic therapy for diverticulitis. Patient returns for follow up on 09/03/19. Patient would like Dr. Hale Bogus advise. [name]

## 2019-09-03 ENCOUNTER — Telehealth (INDEPENDENT_AMBULATORY_CARE_PROVIDER_SITE_OTHER): Payer: Commercial Managed Care - PPO | Admitting: Internal Medicine

## 2019-09-03 ENCOUNTER — Other Ambulatory Visit: Payer: Self-pay

## 2019-09-03 DIAGNOSIS — K5720 Diverticulitis of large intestine with perforation and abscess without bleeding: Secondary | ICD-10-CM | POA: Diagnosis not present

## 2019-09-03 NOTE — Progress Notes (Signed)
Virtual Visit via Telephone Note  I connected with [name] on 09/03/19 at  2:30 PM EDT by telephone and verified that I am speaking with the correct person using two identifiers.  Location: Patient: Home Provider: [facility]   I discussed the limitations, risks, security and privacy concerns of performing an evaluation and management service by telephone and the availability of in person appointments. I also discussed with the patient that there may be a patient responsible charge related to this service. The patient expressed understanding and agreed to proceed.   History of Present Illness: Saya was recently hospitalized with acute diverticulitis complicated by diverticular abscess and acute kidney injury.  She  completed 49 days of therapy with levofloxacin and metronidazole on 07/29/2019 after a follow-up CT scan showed that her diverticular abscess had resolved.  Her drainage catheter was removed.  She is feeling much better.  She has been having some problem with constipation.  When she is constipated she takes MiraLAX and then will have a period of time when she has very soft stool.  She says that she has been eating less and trying to lose weight.  She has not had any fever, nausea, or vomiting.  She has had some very mild intermittent abdominal discomfort.    Assessment and Plan: She continues to improve 5 weeks after stopping antibiotics.  Her diverticular abscess has been cured.  I am comfortable having her restart her CellCept and methotrexate now.  Follow Up Instructions: She can follow-up here as needed   I discussed the assessment and treatment plan with the patient. The patient was provided an opportunity to ask questions and all were answered. The patient agreed with the plan and demonstrated an understanding of the instructions.   The patient was advised to call back or seek an in-person evaluation if the symptoms worsen or if the condition fails to improve as  anticipated.  I provided 15 minutes of non-face-to-face time during this encounter.   [name], MD

## 2019-09-08 MED FILL — Ceftriaxone Sodium For Inj 2 GM: INTRAMUSCULAR | Qty: 20 | Status: AC

## 2019-09-08 MED FILL — Potassium Chloride Microencapsulated Crys ER Tab 20 mEq: ORAL | Qty: 40 | Status: AC

## 2019-09-08 MED FILL — Bupropion HCl Tab ER 24HR 150 MG: ORAL | Qty: 450 | Status: AC

## 2019-09-08 MED FILL — Sodium Chloride IV Soln 0.9%: INTRAVENOUS | Qty: 100 | Status: AC

## 2019-09-08 MED FILL — Metronidazole Tab 500 MG: ORAL | Qty: 500 | Status: AC

## 2019-09-08 MED FILL — Pantoprazole Sodium EC Tab 40 MG (Base Equiv): ORAL | Qty: 40 | Status: AC

## 2019-09-08 MED FILL — Citalopram Hydrobromide Tab 40 MG (Base Equiv): ORAL | Qty: 40 | Status: AC

## 2019-09-08 MED FILL — Carvedilol Tab 12.5 MG: ORAL | Qty: 12.5 | Status: AC

## 2019-10-16 ENCOUNTER — Other Ambulatory Visit: Payer: Self-pay | Admitting: Cardiovascular Disease

## 2019-11-27 ENCOUNTER — Other Ambulatory Visit: Payer: Self-pay | Admitting: Cardiovascular Disease

## 2019-11-27 ENCOUNTER — Other Ambulatory Visit: Payer: Self-pay | Admitting: *Deleted

## 2019-11-27 DIAGNOSIS — R0600 Dyspnea, unspecified: Secondary | ICD-10-CM

## 2019-11-27 NOTE — Progress Notes (Deleted)
@Patient  ID: [name], female    DOB: [birth year], 58 y.o.   MRN: [id number]  No chief complaint on file.   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 Posey Pronto Followed by Dr. Kathlene November in rheumatology Followed by [facility] pulmonology Dr. Pricilla Larsson.   Previous [facility] pulmonary encounter: 05/23/2019 Follow up : ANCA associated vasculitis Patient returns for a follow-up visit.  She was last seen in 2019.  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  She was seen by rheumatology initially started on Rituxan and steroids.  Unfortunately patient had cardiomyopathy associated to her Rituxan and this was stopped in 2019.  She has been seen at [facility] rheumatology and pulmonology.  She is now on CellCept 1500 mg twice daily and methotrexate.  She is followed by cardiology and is on Coreg and Entresto.  Last 2D echo showed EF 65 to 70% normal right ventricle systolic function, left and right atrial normal size.  She says overall her breathing is doing okay she does get short of breath with activity and has low activity tolerance.  She is on Advair twice daily denies any wheezing.  Does have a dry intermittent cough but describes it as mild. She is on Advair twice daily Stable pulmonary function testing 2020FEV1 63%, ratio 77, FVC 61%, DLCO 92% (BMI 57) She says that she is active and independent.  She is able to drive.  Does her light  housework.  She is no longer using oxygen for several years.  She has received both of her Covid vaccines. She denies any chest pain hemoptysis abdominal pain nausea vomiting diarrhea or increased leg swelling.  She has been followed by gynecology for dysfunctional bleeding with postmenopausal bleeding.  She is requiring a D&C procedure to evaluate this bleeding.  She is requesting a pulmonary preop clearance today.  Patient has no formal diagnosis of sleep apnea.  She has never had a sleep study.  She says she has some teeth grinding and is reported some mild snoring in the past.  She is not on oxygen at nighttime.  Walk test in the office shows O2 saturations at 97% walking on room air.  O2 saturation at rest is 100%.   11/28/2019- Interim hx Patient presents today for 6 month follow-up with PFTs.       TEST/EVENTS :  2015 CT chest diffuse bilateral airspace opacities Serology P ANCA 1: 640, mild low peroxidase greater than 8 Bacterial and viral cultures negative Tx w/  Imuran July 2015 High-resolution CT chest April 2019 showed no evidence of interstitial lung disease CT chest July 2020 no evidence of fibrotic interstitial lung disease  Allergies  Allergen Reactions   Guaifenesin Nausea And Vomiting and Other (See Comments)    un   Mucinex [Guaifenesin Er] Nausea And Vomiting   Sulfa Antibiotics Nausea And Vomiting    Immunization History  Administered Date(s) Administered   Influenza Split 10/09/2016   Influenza,inj,Quad PF,6+ Mos 11/03/2015   PFIZER SARS-COV-2 Vaccination 03/27/2019, 04/17/2019   Tdap 03/29/2012    Past  Medical History:  Diagnosis Date   Acute blood loss anemia 06/23/2013   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   Diverticulitis of large intestine with perforation 09/23/2012   Dyspnea 05/11/2014   GERD (gastroesophageal reflux disease)    Hemoptysis 06/23/2013   History of endometriosis 1997   Hx of tobacco use, presenting hazards to health 09/23/2012   Hyperglycemia 07/24/2013   Hypertension    Hypokalemia 07/20/2013   ILD (interstitial lung disease) ([place]) 01/22/2014   Leukocytosis 05/11/2014   MPA (microscopic polyangiitis) ([place]) 2015   Numbness    Obesity (BMI 30-39.9) 09/23/2012   Paresthesia 12/26/2016   Perforation of sigmoid colon - stercoral 09/27/2012   Physical deconditioning 04/30/2015   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   Respiratory failure with hypoxia ([place]) 06/23/2013   Right leg swelling 12/26/2016    Tobacco History: Social History   Tobacco Use  Smoking Status Former Smoker   Packs/day: 1.00   Years: 27.00   Pack years: 27.00   Quit date: 01/09/2001   Years since quitting: 18.8  Smokeless Tobacco Never Used   Counseling given: Not Answered   Outpatient Medications Prior to Visit  Medication Sig Dispense Refill   ALPRAZolam (XANAX) 1 MG tablet Take 1 tablet (1 mg total) by mouth 3 (three) times daily as needed for anxiety. 5 tablet 0   buPROPion (WELLBUTRIN XL) 150 MG 24 hr tablet Take 450 mg by mouth daily.     carvedilol (COREG) 12.5 MG tablet TAKE 1 TABLET BY MOUTH TWICE A DAY (Patient taking differently: Take 12.5 mg by mouth 2 (two) times daily with a meal. ) 180 tablet 1   citalopram (CELEXA) 40 MG tablet Take 40 mg by mouth daily.     clobetasol cream (TEMOVATE) 7.03 % Apply 1 application topically 2 (two) times daily as needed (paronychia).      dextromethorphan (DELSYM) 30  MG/5ML liquid Take 15 mg by mouth 2 (two) times daily as needed for cough.      ENTRESTO 49-51 MG TAKE 1 TABLET BY MOUTH TWICE A DAY 180 tablet 2   esomeprazole (NEXIUM) 20 MG capsule Take 40 mg by mouth daily.      folic acid (FOLVITE) 1 MG tablet Take 1 mg by mouth daily.      gabapentin (NEURONTIN) 400 MG capsule Take 400 mg by mouth at bedtime as needed (pain).      metFORMIN (GLUCOPHAGE-XR) 500 MG 24 hr tablet Take 500 mg by mouth daily with breakfast.      methotrexate (50 MG/ML) 1 g injection Inject 1 mL (50 mg total) into the vein once a week. (Patient not taking: Reported on 07/15/2019) 1 each    Multiple Vitamin (MULTIVITAMIN) tablet Take 2 tablets by mouth daily.      mycophenolate (CELLCEPT) 500 MG tablet Take 3 tablets (1,500 mg total) by mouth 2 (two) times daily. (Patient not taking: Reported on 07/15/2019)     oxyCODONE (OXY IR/ROXICODONE) 5 MG immediate release tablet Take 5 mg by mouth every  6 (six) hours as needed for pain.     polyethylene glycol (MIRALAX / GLYCOLAX) packet Take 17 g by mouth daily as needed for moderate constipation (constipation).      progesterone (PROMETRIUM) 200 MG capsule Take 200 mg by mouth at bedtime. (Patient not taking: Reported on 07/15/2019)     No facility-administered medications prior to visit.      Review of Systems  Review of Systems   Physical Exam  There were no vitals taken for this visit. Physical Exam   Lab Results:  CBC    Component Value Date/Time   WBC 11.7 (H) 06/26/2019 0414   RBC 3.47 (L) 06/26/2019 0414   HGB 9.1 (L) 06/26/2019 0414   HGB 12.8 07/08/2014 1433   HCT 30.4 (L) 06/26/2019 0414   HCT 39.8 07/08/2014 1433   PLT 231 06/26/2019 0414   PLT 313 07/08/2014 1433   MCV 87.6 06/26/2019 0414   MCV 81 07/08/2014 1433   MCH 26.2 06/26/2019 0414   MCHC 29.9 (L) 06/26/2019 0414   RDW 14.9 06/26/2019 0414   RDW 16.0 (H) 07/08/2014 1433   LYMPHSABS 1.3 06/16/2019 0419   LYMPHSABS 1.7 07/08/2014 1433    MONOABS 1.1 (H) 06/16/2019 0419   EOSABS 0.3 06/16/2019 0419   EOSABS 0.2 07/08/2014 1433   BASOSABS 0.1 06/16/2019 0419   BASOSABS 0.1 07/08/2014 1433    BMET    Component Value Date/Time   NA 141 06/30/2019 0221   NA 139 12/24/2017 1641   K 3.5 06/30/2019 0221   CL 106 06/30/2019 0221   CO2 26 06/30/2019 0221   GLUCOSE 138 (H) 06/30/2019 0221   BUN 13 06/30/2019 0221   BUN 15 12/24/2017 1641   CREATININE 1.08 (H) 06/30/2019 0221   CALCIUM 8.3 (L) 06/30/2019 0221   GFRNONAA 57 (L) 06/30/2019 0221   GFRAA >60 06/30/2019 0221    BNP    Component Value Date/Time   BNP 44.6 03/15/2018 1225    ProBNP    Component Value Date/Time   PROBNP 522.3 (H) 06/24/2013 0304    Imaging: No results found.   Assessment & Plan:   No problem-specific Assessment & Plan notes found for this encounter.     Martyn Ehrich, NP 11/27/2019

## 2019-11-28 ENCOUNTER — Ambulatory Visit: Payer: Commercial Managed Care - PPO | Admitting: Primary Care

## 2019-11-28 ENCOUNTER — Ambulatory Visit: Payer: Commercial Managed Care - PPO | Admitting: Adult Health

## 2020-01-05 ENCOUNTER — Ambulatory Visit: Payer: Commercial Managed Care - PPO | Admitting: Pulmonary Disease

## 2020-10-14 ENCOUNTER — Other Ambulatory Visit: Payer: Self-pay | Admitting: Cardiovascular Disease

## 2020-11-26 ENCOUNTER — Other Ambulatory Visit: Payer: Self-pay | Admitting: Cardiovascular Disease

## 2021-01-19 NOTE — Progress Notes (Deleted)
Cardiology Office Note    Date:  01/19/2021   ID:  [name], DOB October 21, 1960, MRN [id number]   PCP:  Christain Sacramento, MD   [place]  Cardiologist:  Jenkins Rouge, MD   Advanced Practice Provider:  No care team member to display Electrophysiologist:  None   [phone]   No chief complaint on file.   History of Present Illness:  [name] is a 61 y.o. female with history of chemo induced cardiomyopathy, microscopic polyangiitis, morbid obesity, DM, anxiety and depression   Patient had reportedly normal echocardiogram in 2015. Patient had no known cardiac disease prior to the fall of 2019. 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  Due to exertional dyspnea, an echo was done in October 2019 which showed EF function down to 35 to 40%.  Her Rituximab stopped and mycophenolate started. Stress test 02/2018 without ischemia or infraction with EF of 68%. Echo 03/2018 showed LVEF > 65%. Seen by Dr. Johnsie Cancel 09/2018. She was doing well. Repeat echo 10/2018 showed LVEF of 65-70%.    She is on acetazolamide for the intracranial hypertension. Has chronic dyspnea 2nd to obesity and deconditioning. Microscopic polyangiitis followed at [facility].    Last seen here 05/2019 and cleared for surgery. Chronic DOE due to obesity.   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   Diverticulitis of large  intestine with perforation 09/23/2012   Dyspnea 05/11/2014   GERD (gastroesophageal reflux disease)    Hemoptysis 06/23/2013   History of endometriosis 1997   Hx of tobacco use, presenting hazards to health 09/23/2012   Hyperglycemia 07/24/2013   Hypertension    Hypokalemia 07/20/2013   ILD (interstitial lung disease) ([place]) 01/22/2014   Leukocytosis 05/11/2014   MPA (microscopic polyangiitis) ([place]) 2015   Numbness    Obesity (BMI 30-39.9) 09/23/2012   Paresthesia 12/26/2016   Perforation of sigmoid colon - stercoral 09/27/2012   Physical deconditioning 04/30/2015   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   Respiratory failure with hypoxia ([place]) 06/23/2013   Right leg swelling 12/26/2016    Past Surgical History:  Procedure Laterality Date   APPENDECTOMY     BLADDER REPAIR     DILATATION & CURETTAGE/HYSTEROSCOPY WITH MYOSURE N/A 06/03/2019   Procedure: DILATATION & CURETTAGE/HYSTEROSCOPY WITH MYOSURE;  Surgeon: Everlene Farrier, MD;  Location: [place];  Service: Gynecology;  Laterality: N/A;   IR CATHETER TUBE CHANGE  06/24/2019   IR FLUORO GUIDE CV LINE RIGHT  06/16/2019   IR RADIOLOGIST EVAL & MGMT  07/29/2019   IR US GUIDE VASC ACCESS RIGHT  06/16/2019   LAPAROSCOPY  1997   dx of endometriosis   LAPAROSCOPY  04/28/2000    Laparoscopy with lysis of adhesions, hysteroscopy, D&C.   TONSILLECTOMY      Current Medications: No outpatient medications have been marked as taking for the 01/25/21 encounter (Appointment) with Imogene Burn, PA-C.  Allergies:   Guaifenesin, Mucinex [guaifenesin er], and Sulfa antibiotics   Social History   Socioeconomic History   Marital status: Married    Spouse name: Not on file   Number of children: 0   Years of education: 12   Highest education level: High school graduate  Occupational History   Occupation: Disabled  Tobacco Use   Smoking status: Former    Packs/day: 1.00    Years: 27.00    Pack years: 27.00    Types: Cigarettes    Quit date:  01/09/2001    Years since quitting: 20.0   Smokeless tobacco: Never  Vaping Use   Vaping Use: Never used  Substance and Sexual Activity   Alcohol use: No   Drug use: No   Sexual activity: Not on file  Other Topics Concern   Not on file  Social History Narrative   Lives at home with her husband.   Right-handed.   1 cup caffeine per day, occasionally more.   Social Determinants of Health   Financial Resource Strain: Not on file  Food Insecurity: Not on file  Transportation Needs: Not on file  Physical Activity: Not on file  Stress: Not on file  Social Connections: Not on file     Family History:  The patient's ***family history includes Birth defects in her cousin; Breast cancer in her paternal grandmother; CAD in an other family member; Diabetes in her father and mother; Hypertension in her father; Obesity in her father.   ROS:   Please see the history of present illness.    ROS All other systems reviewed and are negative.   PHYSICAL EXAM:   VS:  There were no vitals taken for this visit.  Physical Exam  GEN: Well nourished, well developed, in no acute distress  HEENT: normal  Neck: no JVD, carotid bruits, or masses Cardiac:RRR; no murmurs, rubs, or gallops  Respiratory:  clear to auscultation bilaterally, normal work of breathing GI: soft, nontender, nondistended, + BS Ext: without cyanosis, clubbing, or edema, Good distal pulses bilaterally MS: no deformity or atrophy  Skin: warm and dry, no rash Neuro:  Alert and Oriented x 3, Strength and sensation are intact Psych: euthymic mood, full affect  Wt Readings from Last 3 Encounters:  06/23/19 (!) 396 lb 13.3 oz (180 kg)  05/30/19 (!) 384 lb 6.4 oz (174.4 kg)  05/23/19 (!) 384 lb 9.6 oz (174.5 kg)      Studies/Labs Reviewed:   EKG:  EKG is*** ordered today.  The ekg ordered today demonstrates ***  Recent Labs: No results found for requested labs within last 8760 hours.   Lipid Panel    Component Value  Date/Time   TRIG 159 (H) 09/30/2012 0545    Additional studies/ records that were reviewed today include:  Echocardiogram: 10/2018 1. Left ventricular ejection fraction, by visual estimation, is 65 to  70%. The left ventricle has normal function. Normal left ventricular size.  There is no left ventricular hypertrophy.   2. Definity contrast agent was given IV to delineate the left ventricular  endocardial borders.   3. Global right ventricle has normal systolic function.The right  ventricular size is normal. No increase in right ventricular wall  thickness.   4. Left atrial size was normal.   5. Right atrial size was normal.   6. The mitral valve is normal in structure. Trace mitral valve  regurgitation.   7. The tricuspid valve is normal in structure. Tricuspid valve  regurgitation was not  visualized by color flow Doppler.   8. The aortic valve is normal in structure. Aortic valve regurgitation  was not visualized by color flow Doppler.   9. The pulmonic valve was normal in structure. Pulmonic valve  regurgitation is not visualized by color flow Doppler.  10. The inferior vena cava is normal in size with <50% respiratory  variability, suggesting right atrial pressure of 8 mmHg.  11. The left ventricular function has unchanged.      Stress test 02/2018 Nuclear stress EF: 68%. There was no ST segment deviation noted during stress. The study is normal. This is a low risk study. The left ventricular ejection fraction is hyperdynamic (>65%).   Normal stress nuclear study with no ischemia or infarction.  Gated ejection fraction 68% with normal wall motion.       Risk Assessment/Calculations:   {Does this patient have ATRIAL FIBRILLATION?:[phone]}     ASSESSMENT:    No diagnosis found.   PLAN:  In order of problems listed above:  NICM/chemo induced and LVEF normal after discontinuation Rituximab. Last echo 09/2018 LVEF 65-70%  Obesity  DM  Shared Decision  Making/Informed Consent   {Are you ordering a CV Procedure (e.g. stress test, cath, DCCV, TEE, etc)?   Press F2        :[id number]}    Medication Adjustments/Labs and Tests Ordered: Current medicines are reviewed at length with the patient today.  Concerns regarding medicines are outlined above.  Medication changes, Labs and Tests ordered today are listed in the Patient Instructions below. There are no Patient Instructions on file for this visit.   Signed, Ermalinda Barrios, PA-C  01/19/2021 2:45 PM    [facility] [place], [place], [place]  [address] Phone: [phone]; Fax: [phone]

## 2021-01-25 ENCOUNTER — Ambulatory Visit: Payer: Commercial Managed Care - PPO | Admitting: Physician Assistant

## 2021-01-25 DIAGNOSIS — I428 Other cardiomyopathies: Secondary | ICD-10-CM

## 2021-01-25 DIAGNOSIS — E669 Obesity, unspecified: Secondary | ICD-10-CM

## 2021-02-05 ENCOUNTER — Other Ambulatory Visit: Payer: Self-pay | Admitting: Cardiovascular Disease

## 2021-03-08 NOTE — Progress Notes (Signed)
Office Visit    Patient Name: [name] Date of Encounter: 03/10/2021  Primary Care Provider:  Christain Sacramento, MD Primary Cardiologist:  Jenkins Rouge, MD  Chief Complaint    61 year old female history of chemo-induced cardiomyopathy,  hypertension, microscopic polyangiitis, morbid obesity, type 2 diabetes, diverticulitis, anxiety, and depression who presents for follow-up related to cardiomyopathy and hypertension.  Past Medical History    Past Medical History:  Diagnosis Date   Acute blood loss anemia 06/23/2013   ANCA-associated vasculitis    ANCA-positive vasculitis 06/24/2013   Anxiety associated with depression 09/23/2012   At high risk for falls 09/23/2012   She has had a fractured ankle and tendon tear with falls over the last couple years.   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   Diverticulitis of large intestine with perforation 09/23/2012   Dyspnea 05/11/2014   GERD (gastroesophageal reflux disease)    Hemoptysis 06/23/2013   History of endometriosis 1997   Hx of tobacco use, presenting hazards to health 09/23/2012   Hyperglycemia 07/24/2013   Hypertension    Hypokalemia 07/20/2013   ILD (interstitial lung disease) ([place]) 01/22/2014   Leukocytosis 05/11/2014   MPA (microscopic polyangiitis) ([place]) 2015   Numbness    Obesity (BMI 30-39.9) 09/23/2012   Paresthesia 12/26/2016   Perforation of sigmoid colon - stercoral 09/27/2012   Physical deconditioning 04/30/2015   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   Respiratory failure with hypoxia ([place]) 06/23/2013   Right leg swelling 12/26/2016   Past Surgical History:  Procedure Laterality Date   APPENDECTOMY     BLADDER REPAIR     DILATATION & CURETTAGE/HYSTEROSCOPY WITH MYOSURE N/A 06/03/2019    Procedure: DILATATION & CURETTAGE/HYSTEROSCOPY WITH MYOSURE;  Surgeon: Everlene Farrier, MD;  Location: [place];  Service: Gynecology;  Laterality: N/A;   IR CATHETER TUBE CHANGE  06/24/2019   IR FLUORO GUIDE CV LINE RIGHT  06/16/2019   IR RADIOLOGIST EVAL & MGMT  07/29/2019   IR US GUIDE VASC ACCESS RIGHT  06/16/2019   LAPAROSCOPY  1997   dx of endometriosis   LAPAROSCOPY  04/28/2000    Laparoscopy with lysis of adhesions, hysteroscopy, D&C.   TONSILLECTOMY      Allergies  Allergies  Allergen Reactions   Guaifenesin Nausea And Vomiting and Other (See Comments)    un   Mucinex [Guaifenesin Er] Nausea And Vomiting   Sulfa Antibiotics Nausea And Vomiting    History of Present Illness    61 year old female with the above past medical history including chemo-induced cardiomyopathy, hypertension, microscopic polyangiitis, morbid obesity, type 2 diabetes, diverticulitis, anxiety, and depression.  Patient has a history of microscopic polyangiitis followed at [facility].  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  Due to exertional dyspnea, echocardiogram was completed in October 2019 which showed EF 35 to 40%.  It was thought that her cardiomyopathy was secondary to chemotherapy and she was transitioned from rituximab to mycophenolate. Stress test in February 2020 was negative for ischemia, EF 60%. Echocardiogram in March 2020 showed EF greater than 65%.  Repeat echocardiogram in October 2020 showed EF 65 to 70%.  She has a history of intracranial hypertension of unknown cause, follows with neurology. She has chronic dyspnea secondary to obesity and deconditioning. She was last  seen in the office on 05/21/2019 and was stable from a cardiac standpoint. She was hospitalized in June 2021 in the setting of acute diverticulitis and treated with IV antibiotics. She was noted to be orthostatic during her her hospitalization. She recently saw her PCP for worsening cough and was treated for acute  bronchitis. BNP was normal at the time.   She presents today for follow-up.  Since her last visit she has been stable from a cardiac standpoint. She has chronic generalized fatigue, stable dyspnea on exertion. She reports some weight loss since starting Ozempic. Upon questioning, she does not think that her dyspnea has worsened since her last visit. She states she thought her dyspnea would dramatically improve with weight loss. She states she is due for follow-up with pulmonology.  Otherwise, she denies any additional cardiac concerns or complaints today.  Home Medications    Current Outpatient Medications  Medication Sig Dispense Refill   albuterol (VENTOLIN HFA) 108 (90 Base) MCG/ACT inhaler SMARTSIG:2 Puff(s) By Mouth Every 6 Hours PRN     ALPRAZolam (XANAX) 1 MG tablet Take 1 tablet (1 mg total) by mouth 3 (three) times daily as needed for anxiety. 5 tablet 0   buPROPion (WELLBUTRIN XL) 150 MG 24 hr tablet Take 450 mg by mouth daily.     citalopram (CELEXA) 40 MG tablet Take 40 mg by mouth daily.     colchicine 0.6 MG tablet 1 tablet     esomeprazole (NEXIUM) 20 MG capsule 2 capsules     Multiple Vitamin (MULTIVITAMIN) tablet Take 2 tablets by mouth daily.      mycophenolate (CELLCEPT) 500 MG tablet Take 3 tablets (1,500 mg total) by mouth 2 (two) times daily.     phentermine (ADIPEX-P) 37.5 MG tablet Take by mouth.     polyethylene glycol (MIRALAX / GLYCOLAX) packet Take 17 g by mouth daily as needed for moderate constipation (constipation).      Semaglutide,0.25 or 0.5MG /DOS, (OZEMPIC, 0.25 OR 0.5 MG/DOSE,) 2 MG/1.5ML SOPN 0.25-0.5 mg     tiZANidine (ZANAFLEX) 4 MG tablet Take 4 mg by mouth 3 (three) times daily.     carvedilol (COREG) 12.5 MG tablet Take 1 tablet (12.5 mg total) by mouth 2 (two) times daily. 60 tablet 11   folic acid (FOLVITE) 1 MG tablet Take 1 mg by mouth daily.      gabapentin (NEURONTIN) 400 MG capsule Take 400 mg by mouth at bedtime as needed (pain).      meloxicam  (MOBIC) 15 MG tablet Take 15 mg by mouth daily.     sacubitril-valsartan (ENTRESTO) 49-51 MG Take 1 tablet by mouth 2 (two) times daily. 60 tablet 11   No current facility-administered medications for this visit.     Review of Systems    She denies chest pain, palpitations, pnd, orthopnea, n, v, dizziness, syncope, edema, weight gain, or early satiety. All other systems reviewed and are otherwise negative except as noted above.   Physical Exam    VS:  BP 110/68    Pulse 76    Ht 5' 8.5" (1.74 m)    Wt (!) 357 lb 14.4 oz (162.3 kg)    SpO2 97%    BMI 53.63 kg/m   GEN: Well nourished, well developed, in no acute distress. HEENT: normal. Neck: Supple, no JVD, carotid bruits, or masses. Cardiac: RRR, no murmurs, rubs, or gallops. No clubbing, cyanosis, edema.  Radials/DP/PT 2+ and equal bilaterally.  Respiratory:  Respirations regular and unlabored, clear to auscultation  bilaterally. GI: Obese, soft, nontender, nondistended, BS + x 4. MS: no deformity or atrophy. Skin: warm and dry, no rash. Neuro:  Strength and sensation are intact. Psych: Normal affect.  Accessory Clinical Findings    ECG personally reviewed by me today - NSR, 76 bpm - no acute changes.  Lab Results  Component Value Date   WBC 11.7 (H) 06/26/2019   HGB 9.1 (L) 06/26/2019   HCT 30.4 (L) 06/26/2019   MCV 87.6 06/26/2019   PLT 231 06/26/2019   Lab Results  Component Value Date   CREATININE 1.08 (H) 06/30/2019   BUN 13 06/30/2019   NA 141 06/30/2019   K 3.5 06/30/2019   CL 106 06/30/2019   CO2 26 06/30/2019   Lab Results  Component Value Date   ALT 22 06/18/2019   AST 38 06/18/2019   ALKPHOS 102 06/18/2019   BILITOT 0.5 06/18/2019   Lab Results  Component Value Date   TRIG 159 (H) 09/30/2012    Lab Results  Component Value Date   HGBA1C 7.0 (H) 05/30/2019    Assessment & Plan   1. Chemotherapy induced cardiomyopathy/NICM: Echo in October 2019 which showed EF 35 to 40% in the setting of  rituximab therapy for her ANCA+ vasculitis. Stress test in February 2020 was negative for ischemia, EF 60%. Most recent echo in October 2020 showed EF 65 to 70%. Euvolemic and well compensated on exam.  She has stable generalized fatigue and stable chronic dyspnea on exertion.  Discussed the possibility of repeat echocardiogram.  She declines at this time.  I advised her that if she begins to experience worsening dyspnea, will consider repeat echo.  She verbalized understanding.  Continue carvedilol, Entresto.  2. Chronic dyspnea on exertion: She does report ongoing stable chronic dyspnea on exertion. She plans to follow-up with pulmonology soon. If dyspnea worsens, consider repeat echocardiogram as above.  3. Possible sleep apnea: She does report some nocturnal apnea.   Declines sleep study at this time.   4. Microscopic polyangiitis: H/o ANCA+ vasculitis. No longer following with [facility], follows with rheumatology.  5. H/o intracranial hypertension: Was following with neurology, has not been seen recently. Encouraged her to scheudle folow-up.   6. Type 2 diabetes: A1c was 6.1 in 11/2020.  She was recently started on Ozempic.  7. Morbid obesity: Recent weight loss with Ozempic as above.  Encouraged continued lifestyle modifications with diet and exercise.  8. Disposition: Follow-up in 6 months.  Lenna Sciara, NP 03/10/2021, 4:42 PM

## 2021-03-10 ENCOUNTER — Encounter (HOSPITAL_BASED_OUTPATIENT_CLINIC_OR_DEPARTMENT_OTHER): Payer: Self-pay | Admitting: Nurse Practitioner

## 2021-03-10 ENCOUNTER — Other Ambulatory Visit: Payer: Self-pay

## 2021-03-10 ENCOUNTER — Ambulatory Visit (HOSPITAL_BASED_OUTPATIENT_CLINIC_OR_DEPARTMENT_OTHER): Payer: Commercial Managed Care - PPO | Admitting: Nurse Practitioner

## 2021-03-10 VITALS — BP 110/68 | HR 76 | Ht 68.5 in | Wt 357.9 lb

## 2021-03-10 DIAGNOSIS — I428 Other cardiomyopathies: Secondary | ICD-10-CM

## 2021-03-10 DIAGNOSIS — I427 Cardiomyopathy due to drug and external agent: Secondary | ICD-10-CM | POA: Diagnosis not present

## 2021-03-10 DIAGNOSIS — Z9189 Other specified personal risk factors, not elsewhere classified: Secondary | ICD-10-CM

## 2021-03-10 DIAGNOSIS — T451X5A Adverse effect of antineoplastic and immunosuppressive drugs, initial encounter: Secondary | ICD-10-CM | POA: Diagnosis not present

## 2021-03-10 DIAGNOSIS — E118 Type 2 diabetes mellitus with unspecified complications: Secondary | ICD-10-CM

## 2021-03-10 DIAGNOSIS — G932 Benign intracranial hypertension: Secondary | ICD-10-CM

## 2021-03-10 DIAGNOSIS — R0609 Other forms of dyspnea: Secondary | ICD-10-CM

## 2021-03-10 DIAGNOSIS — M317 Microscopic polyangiitis: Secondary | ICD-10-CM

## 2021-03-10 MED ORDER — CARVEDILOL 12.5 MG PO TABS: 12.5000 mg | ORAL_TABLET | Freq: Two times a day (BID) | ORAL | 11 refills | Status: DC

## 2021-03-10 MED ORDER — ENTRESTO 49-51 MG PO TABS: 1.0000 | ORAL_TABLET | Freq: Two times a day (BID) | ORAL | 11 refills | Status: DC

## 2021-03-10 NOTE — Patient Instructions (Signed)
Medication Instructions:  ?Continue your current medications.  ? ?*If you need a refill on your cardiac medications before your next appointment, please call your pharmacy* ? ? ?Lab Work: ?None ordered today.  ? ?If you have labs (blood work) drawn today and your tests are completely normal, you will receive your results only by: ?MyChart Message (if you have MyChart) OR ?A paper copy in the mail ?If you have any lab test that is abnormal or we need to change your treatment, we will call you to review the results. ? ? ?Testing/Procedures: ?Your EKG today looked good! ? ? ?Follow-Up: ?At [facility], you and your health needs are our priority.  As part of our continuing mission to provide you with exceptional heart care, we have created designated Provider Care Teams.  These Care Teams include your primary Cardiologist (physician) and Advanced Practice Providers (APPs -  Physician Assistants and Nurse Practitioners) who all work together to provide you with the care you need, when you need it. ? ?We recommend signing up for the patient portal called "MyChart".  Sign up information is provided on this After Visit Summary.  MyChart is used to connect with patients for Virtual Visits (Telemedicine).  Patients are able to view lab/test results, encounter notes, upcoming appointments, etc.  Non-urgent messages can be sent to your provider as well.   ?To learn more about what you can do with MyChart, go to [email or web address].   ? ?Your next appointment:   ?6 month(s) ? ?The format for your next appointment:   ?In Person ? ?Provider:   ?Jenkins Rouge, MD or Advanced Practice Provider  ? ? ?Other Instructions ? ?Heart Healthy Diet Recommendations: ?A low-salt diet is recommended. Meats should be grilled, baked, or boiled. Avoid fried foods. Focus on lean protein sources like fish or chicken with vegetables and fruits. The American Heart Association is a Microbiologist!  American Heart Association Diet and Lifeystyle  Recommendations   ? ?Exercise recommendations: ?The American Heart Association recommends 150 minutes of moderate intensity exercise weekly. ?Try 30 minutes of moderate intensity exercise 4-5 times per week. ?This could include walking, jogging, or swimming. ? ?Recommend weighing daily and keeping a log. Please call our office if you have weight gain of 2 pounds overnight or 5 pounds in 1 week.  ? ?Date ? Time Weight  ? ?    ? ?    ? ?    ? ?    ? ?    ? ?    ? ?    ? ?    ?  ? ?

## 2021-09-12 NOTE — Progress Notes (Deleted)
Office Visit    Patient Name: [name] Date of Encounter: 09/12/2021  Primary Care Provider:  Christain Sacramento, MD Primary Cardiologist:  Jenkins Rouge, MD Primary Electrophysiologist: None  Chief Complaint    [name] is a 61 y.o. female with PMH of NICM s/p chemo induced, HTN, morbid obesity, DM type II, anxiety, microscopic polyangiitis who presents for 64-month follow-up cardiomyopathy and hypertension.  Past Medical History    Past Medical History:  Diagnosis Date   Acute blood loss anemia 06/23/2013   ANCA-associated vasculitis    ANCA-positive vasculitis 06/24/2013   Anxiety associated with depression 09/23/2012   At high risk for falls 09/23/2012   She has had a fractured ankle and tendon tear with falls over the last couple years.   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   Diverticulitis of large intestine with perforation 09/23/2012   Dyspnea 05/11/2014   GERD (gastroesophageal reflux disease)    Hemoptysis 06/23/2013   History of endometriosis 1997   Hx of tobacco use, presenting hazards to health 09/23/2012   Hyperglycemia 07/24/2013   Hypertension    Hypokalemia 07/20/2013   ILD (interstitial lung disease) ([place]) 01/22/2014   Leukocytosis 05/11/2014   MPA (microscopic polyangiitis) ([place]) 2015   Numbness    Obesity (BMI 30-39.9) 09/23/2012   Paresthesia 12/26/2016   Perforation of sigmoid colon - stercoral 09/27/2012   Physical deconditioning 04/30/2015   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   Respiratory failure with hypoxia ([place]) 06/23/2013   Right leg swelling 12/26/2016   Past Surgical History:  Procedure Laterality Date   APPENDECTOMY     BLADDER REPAIR     DILATATION & CURETTAGE/HYSTEROSCOPY WITH MYOSURE N/A 06/03/2019    Procedure: DILATATION & CURETTAGE/HYSTEROSCOPY WITH MYOSURE;  Surgeon: Everlene Farrier, MD;  Location: [facility];  Service: Gynecology;  Laterality: N/A;   IR CATHETER TUBE CHANGE  06/24/2019   IR FLUORO GUIDE CV LINE RIGHT  06/16/2019   IR RADIOLOGIST EVAL & MGMT  07/29/2019   IR US GUIDE VASC ACCESS RIGHT  06/16/2019   LAPAROSCOPY  1997   dx of endometriosis   LAPAROSCOPY  04/28/2000    Laparoscopy with lysis of adhesions, hysteroscopy, D&C.   TONSILLECTOMY      Allergies  Allergies  Allergen Reactions   Guaifenesin Nausea And Vomiting and Other (See Comments)    un   Mucinex [Guaifenesin Er] Nausea And Vomiting   Sulfa Antibiotics Nausea And Vomiting    History of Present Illness    [name] is a 61 year old female with the above-mentioned past medical history who presents today for follow-up of hypertension and cardiomyopathy.  She is currently followed at [facility] for polyangiitis and was started on rituximab in 2019.  She underwent 2D echo in 2019 that showed EF of 35-40%.  She had stress test performed 2020 that was negative for ischemia and repeat 2D echo with improved EF of 60% in 03/2018.  She was last seen by Diona Browner, NP on 03/2021 for follow-up.  During visit she appears stable from cardiac perspective and endorsed some generalized fatigue stable dyspnea.  She was euvolemic on examination politely declined repeat echo for evaluation of dyspnea.  She also reported sleep apnea and declined sleep study at that time.  Since last  being seen in the office patient reports***.  Patient denies chest pain, palpitations, dyspnea, PND, orthopnea, nausea, vomiting, dizziness, syncope, edema, weight gain, or early satiety.     ***Notes:  Home Medications    Current Outpatient Medications  Medication Sig Dispense Refill   albuterol (VENTOLIN HFA) 108 (90 Base) MCG/ACT inhaler SMARTSIG:2 Puff(s) By Mouth Every 6 Hours PRN     ALPRAZolam (XANAX) 1 MG tablet Take 1 tablet (1 mg total) by  mouth 3 (three) times daily as needed for anxiety. 5 tablet 0   buPROPion (WELLBUTRIN XL) 150 MG 24 hr tablet Take 450 mg by mouth daily.     carvedilol (COREG) 12.5 MG tablet Take 1 tablet (12.5 mg total) by mouth 2 (two) times daily. 60 tablet 11   citalopram (CELEXA) 40 MG tablet Take 40 mg by mouth daily.     colchicine 0.6 MG tablet 1 tablet     esomeprazole (NEXIUM) 20 MG capsule 2 capsules     folic acid (FOLVITE) 1 MG tablet Take 1 mg by mouth daily.      gabapentin (NEURONTIN) 400 MG capsule Take 400 mg by mouth at bedtime as needed (pain).      meloxicam (MOBIC) 15 MG tablet Take 15 mg by mouth daily.     Multiple Vitamin (MULTIVITAMIN) tablet Take 2 tablets by mouth daily.      mycophenolate (CELLCEPT) 500 MG tablet Take 3 tablets (1,500 mg total) by mouth 2 (two) times daily.     phentermine (ADIPEX-P) 37.5 MG tablet Take by mouth.     polyethylene glycol (MIRALAX / GLYCOLAX) packet Take 17 g by mouth daily as needed for moderate constipation (constipation).      sacubitril-valsartan (ENTRESTO) 49-51 MG Take 1 tablet by mouth 2 (two) times daily. 60 tablet 11   Semaglutide,0.25 or 0.5MG /DOS, (OZEMPIC, 0.25 OR 0.5 MG/DOSE,) 2 MG/1.5ML SOPN 0.25-0.5 mg     tiZANidine (ZANAFLEX) 4 MG tablet Take 4 mg by mouth 3 (three) times daily.     No current facility-administered medications for this visit.     Review of Systems  Please see the history of present illness.    (+)*** (+)***  All other systems reviewed and are otherwise negative except as noted above.  Physical Exam    Wt Readings from Last 3 Encounters:  03/10/21 (!) 357 lb 14.4 oz (162.3 kg)  06/23/19 (!) 396 lb 13.3 oz (180 kg)  05/30/19 (!) 384 lb 6.4 oz (174.4 kg)   QV:ZDGLO were no vitals filed for this visit.,There is no height or weight on file to calculate BMI.  Constitutional:      Appearance: Healthy appearance. Not in distress.  Neck:     Vascular: JVD normal.  Pulmonary:     Effort: Pulmonary effort is  normal.     Breath sounds: No wheezing. No rales. Diminished in the bases Cardiovascular:     Normal rate. Regular rhythm. Normal S1. Normal S2.      Murmurs: There is no murmur.  Edema:    Peripheral edema absent.  Abdominal:     Palpations: Abdomen is soft non tender. There is no hepatomegaly.  Skin:    General: Skin is warm and dry.  Neurological:     General: No focal deficit present.     Mental Status: Alert and oriented to person, place and time.     Cranial Nerves: Cranial nerves are intact.  EKG/LABS/Other Studies Reviewed    ECG personally reviewed by me today - ***  Risk Assessment/Calculations:   {Does this patient have ATRIAL FIBRILLATION?:[phone]}        Lab Results  Component Value Date   WBC 11.7 (H) 06/26/2019   HGB 9.1 (L) 06/26/2019   HCT 30.4 (L) 06/26/2019   MCV 87.6 06/26/2019   PLT 231 06/26/2019   Lab Results  Component Value Date   CREATININE 1.08 (H) 06/30/2019   BUN 13 06/30/2019   NA 141 06/30/2019   K 3.5 06/30/2019   CL 106 06/30/2019   CO2 26 06/30/2019   Lab Results  Component Value Date   ALT 22 06/18/2019   AST 38 06/18/2019   ALKPHOS 102 06/18/2019   BILITOT 0.5 06/18/2019   Lab Results  Component Value Date   TRIG 159 (H) 09/30/2012    Lab Results  Component Value Date   HGBA1C 7.0 (H) 05/30/2019    Assessment & Plan    1.  NICM  2.  Hypertension  3.  Chronic dyspnea  4.  Microscopic polyangiitis  5.  DM type II      Disposition: Follow-up with Jenkins Rouge, MD or APP in *** months {Are you ordering a CV Procedure (e.g. stress test, cath, DCCV, TEE, etc)?   Press F2        :[id number]}   Medication Adjustments/Labs and Tests Ordered: Current medicines are reviewed at length with the patient today.  Concerns regarding medicines are outlined above.   Signed, Mable Fill, Marissa Nestle, NP 09/12/2021, 11:05 AM [place]

## 2021-09-15 ENCOUNTER — Ambulatory Visit: Payer: Commercial Managed Care - PPO | Admitting: Nurse Practitioner

## 2021-09-19 NOTE — Progress Notes (Signed)
Office Visit    Patient Name: [name] Date of Encounter: 09/22/2021  Primary Care Provider:  Christain Sacramento, MD Primary Cardiologist:  Jenkins Rouge, MD Primary Electrophysiologist: None  Chief Complaint    [name] is a 61 y.o. female with PMH of NICM s/p chemo induced, HTN, morbid obesity, DM type II, anxiety, microscopic polyangiitis who presents for 66-month follow-up cardiomyopathy and hypertension.  Past Medical History    Past Medical History:  Diagnosis Date   Acute blood loss anemia 06/23/2013   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[place])    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[place]) 06/24/2013   Anxiety associated with depression 09/23/2012   At high risk for falls 09/23/2012   She has had a fractured ankle and tendon tear with falls over the last couple years.   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   Diverticulitis of large intestine with perforation 09/23/2012   Dyspnea 05/11/2014   GERD (gastroesophageal reflux disease)    Hemoptysis 06/23/2013   History of endometriosis 1997   Hx of tobacco use, presenting hazards to health 09/23/2012   Hyperglycemia 07/24/2013   Hypertension    Hypokalemia 07/20/2013   ILD (interstitial lung disease) ([place]) 01/22/2014   Leukocytosis 05/11/2014   MPA (microscopic polyangiitis) ([place]) 2015   Numbness    Obesity (BMI 30-39.9) 09/23/2012   Paresthesia 12/26/2016   Perforation of sigmoid colon - stercoral 09/27/2012   Physical deconditioning 04/30/2015   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   Respiratory failure with hypoxia ([place]) 06/23/2013   Right leg swelling 12/26/2016   Past Surgical History:  Procedure Laterality Date   APPENDECTOMY     BLADDER REPAIR     DILATATION & CURETTAGE/HYSTEROSCOPY WITH MYOSURE N/A  06/03/2019   Procedure: DILATATION & CURETTAGE/HYSTEROSCOPY WITH MYOSURE;  Surgeon: Everlene Farrier, MD;  Location: [facility];  Service: Gynecology;  Laterality: N/A;   IR CATHETER TUBE CHANGE  06/24/2019   IR FLUORO GUIDE CV LINE RIGHT  06/16/2019   IR RADIOLOGIST EVAL & MGMT  07/29/2019   IR US GUIDE VASC ACCESS RIGHT  06/16/2019   LAPAROSCOPY  1997   dx of endometriosis   LAPAROSCOPY  04/28/2000    Laparoscopy with lysis of adhesions, hysteroscopy, D&C.   TONSILLECTOMY      Allergies  Allergies  Allergen Reactions   Guaifenesin Nausea And Vomiting and Other (See Comments)    un   Mucinex [Guaifenesin Er] Nausea And Vomiting   Sulfa Antibiotics Nausea And Vomiting    History of Present Illness    [name] is a 61 year old female with the above-mentioned past medical history who presents today for follow-up of hypertension and cardiomyopathy.  She is currently followed at [facility] for polyangiitis and was started on rituximab in 2019.  She underwent 2D echo in 2019 that showed EF of 35-40%.  She had stress test performed 2020 that was negative for ischemia and repeat 2D echo with improved EF of 60% in 03/2018.  She was last seen by Diona Browner, NP on 03/2021 for follow-up.  During visit she appears stable from cardiac perspective and endorsed some generalized fatigue stable dyspnea.  She was euvolemic on examination politely declined repeat echo for evaluation of dyspnea.  She also reported sleep apnea and declined sleep study at that time.  [name] presents today alone for follow-up appointment.  Since last being seen in the office patient reports she has been experiencing some chest tightness with activity and without activity.  She also has complaint of left arm that she states is not associated with chest discomfort.  She is compliant with her current medication regimen and denies any medication side effects currently.  She is euvolemic on examination denies any sodium indiscretions.  Patient  denies chest pain, palpitations, dyspnea, PND, orthopnea, nausea, vomiting, dizziness, syncope, edema, weight gain, or early satiety.   Home Medications    Current Outpatient Medications  Medication Sig Dispense Refill   albuterol (VENTOLIN HFA) 108 (90 Base) MCG/ACT inhaler SMARTSIG:2 Puff(s) By Mouth Every 6 Hours PRN     ALPRAZolam (XANAX) 1 MG tablet Take 1 tablet (1 mg total) by mouth 3 (three) times daily as needed for anxiety. 5 tablet 0   buPROPion (WELLBUTRIN XL) 150 MG 24 hr tablet Take 450 mg by mouth daily.     carvedilol (COREG) 12.5 MG tablet Take 1 tablet (12.5 mg total) by mouth 2 (two) times daily. 60 tablet 11   citalopram (CELEXA) 40 MG tablet Take 40 mg by mouth daily.     esomeprazole (NEXIUM) 20 MG capsule 2 capsules     folic acid (FOLVITE) 1 MG tablet Take 1 mg by mouth daily.      gabapentin (NEURONTIN) 400 MG capsule Take 400 mg by mouth at bedtime as needed (pain).      meloxicam (MOBIC) 15 MG tablet Take 15 mg by mouth daily.     Multiple Vitamin (MULTIVITAMIN) tablet Take 2 tablets by mouth daily.      mycophenolate (CELLCEPT) 500 MG tablet Take 3 tablets (1,500 mg total) by mouth 2 (two) times daily.     nitroGLYCERIN (NITROSTAT) 0.4 MG SL tablet Place 1 tablet (0.4 mg total) under the tongue every 5 (five) minutes as needed for chest pain. 90 tablet 3   phentermine (ADIPEX-P) 37.5 MG tablet Take by mouth.     polyethylene glycol (MIRALAX / GLYCOLAX) packet Take 17 g by mouth daily as needed for moderate constipation (constipation).      sacubitril-valsartan (ENTRESTO) 49-51 MG Take 1 tablet by mouth 2 (two) times daily. 60 tablet 11   Semaglutide,0.25 or 0.5MG /DOS, (OZEMPIC, 0.25 OR 0.5 MG/DOSE,) 2 MG/1.5ML SOPN 0.25-0.5 mg     tiZANidine (ZANAFLEX) 4 MG tablet Take 4 mg by mouth 3 (three) times daily.     No current facility-administered medications for this visit.     Review of Systems  Please see the history of present illness.    (+) Chest  pressure (+) Left arm pain  All other systems reviewed and are otherwise negative except as noted above.  Physical Exam    Wt Readings from Last 3 Encounters:  09/22/21 (!) 331 lb (150.1 kg)  03/10/21 (!) 357 lb 14.4 oz (162.3 kg)  06/23/19 (!) 396 lb 13.3 oz (180 kg)   VS: Vitals:   09/22/21 1412  BP: 106/64  Pulse: 97  SpO2: 96%  ,Body mass index is 49.6 kg/m.  Constitutional:      Appearance: Healthy appearance. Not in distress.  Neck:     Vascular: JVD normal.  Pulmonary:     Effort: Pulmonary effort is normal.     Breath sounds: No wheezing. No rales. Diminished in the bases Cardiovascular:     Normal rate. Regular rhythm. Normal S1. Normal S2.      Murmurs:  There is no murmur.  Edema:    Peripheral edema absent.  Abdominal:     Palpations: Abdomen is soft non tender. There is no hepatomegaly.  Skin:    General: Skin is warm and dry.  Neurological:     General: No focal deficit present.     Mental Status: Alert and oriented to person, place and time.     Cranial Nerves: Cranial nerves are intact.  EKG/LABS/Other Studies Reviewed    ECG personally reviewed by me today -sinus rhythm with nonspecific ST and T wave abnormalities with no acute changes and rate of 97 bpm  Lab Results  Component Value Date   WBC 11.7 (H) 06/26/2019   HGB 9.1 (L) 06/26/2019   HCT 30.4 (L) 06/26/2019   MCV 87.6 06/26/2019   PLT 231 06/26/2019   Lab Results  Component Value Date   CREATININE 1.08 (H) 06/30/2019   BUN 13 06/30/2019   NA 141 06/30/2019   K 3.5 06/30/2019   CL 106 06/30/2019   CO2 26 06/30/2019   Lab Results  Component Value Date   ALT 22 06/18/2019   AST 38 06/18/2019   ALKPHOS 102 06/18/2019   BILITOT 0.5 06/18/2019   Lab Results  Component Value Date   TRIG 159 (H) 09/30/2012    Lab Results  Component Value Date   HGBA1C 7.0 (H) 05/30/2019    Assessment & Plan     1.  NICM: -EF was reduced in 2019 due to rituximab therapy for her ANCA+  vasculitis.  -2D echo with improved EF of 60% in 03/2018  -Today patient is euvolemic on examination -Continue GDMT with carvedilol, and Entresto -Patient endorses fatigue but denies dyspnea on exertion -We will repeat 2D echo to evaluate fatigue and current heart function since adding GDMT.  2.  Hypertension: -Blood pressure today was well controlled at 106/64 -Continue carvedilol 12.5 mg twice daily   3.  Chronic dyspnea: -Today patient reports no dyspnea on exertion -Patient declines sleep study  4.  Microscopic polyangiitis:  -H/o ANCA+ vasculitis. No longer following with [facility], follows with rheumatology.  5.  DM type II: -Patient has had significant weight loss on Ozempic -Continue current treatment plan per PCP  Disposition: Follow-up with Jenkins Rouge, MD or APP in 3 months   Medication Adjustments/Labs and Tests Ordered: Current medicines are reviewed at length with the patient today.  Concerns regarding medicines are outlined above.   Signed, Mable Fill, Marissa Nestle, NP 09/22/2021, 4:07 PM [place]

## 2021-09-22 ENCOUNTER — Ambulatory Visit: Payer: Commercial Managed Care - PPO | Attending: Nurse Practitioner | Admitting: Nurse Practitioner

## 2021-09-22 ENCOUNTER — Encounter: Payer: Self-pay | Admitting: Nurse Practitioner

## 2021-09-22 VITALS — BP 106/64 | HR 97 | Ht 68.5 in | Wt 331.0 lb

## 2021-09-22 DIAGNOSIS — I428 Other cardiomyopathies: Secondary | ICD-10-CM | POA: Diagnosis not present

## 2021-09-22 DIAGNOSIS — R0609 Other forms of dyspnea: Secondary | ICD-10-CM

## 2021-09-22 DIAGNOSIS — R0600 Dyspnea, unspecified: Secondary | ICD-10-CM | POA: Diagnosis not present

## 2021-09-22 DIAGNOSIS — E118 Type 2 diabetes mellitus with unspecified complications: Secondary | ICD-10-CM | POA: Diagnosis not present

## 2021-09-22 DIAGNOSIS — I10 Essential (primary) hypertension: Secondary | ICD-10-CM

## 2021-09-22 DIAGNOSIS — I7782 Antineutrophilic cytoplasmic antibody (ANCA) vasculitis: Secondary | ICD-10-CM

## 2021-09-22 MED ORDER — NITROGLYCERIN 0.4 MG SL SUBL: 0.4000 mg | SUBLINGUAL_TABLET | SUBLINGUAL | 3 refills | Status: AC | PRN

## 2021-09-22 NOTE — Patient Instructions (Signed)
Medication Instructions:  Your physician has recommended you make the following change in your medication:  nitrostat/nitroglycerin 0.4mg  as needed for chest pain *If you need a refill on your cardiac medications before your next appointment, please call your pharmacy*   Testing/Procedures: Echo Your physician has requested that you have an echocardiogram. Echocardiography is a painless test that uses sound waves to create images of your heart. It provides your doctor with information about the size and shape of your heart and how well your heart's chambers and valves are working. This procedure takes approximately one hour. There are no restrictions for this procedure.    Follow-Up: At [facility], you and your health needs are our priority.  As part of our continuing mission to provide you with exceptional heart care, we have created designated Provider Care Teams.  These Care Teams include your primary Cardiologist (physician) and Advanced Practice Providers (APPs -  Physician Assistants and Nurse Practitioners) who all work together to provide you with the care you need, when you need it.  We recommend signing up for the patient portal called "MyChart".  Sign up information is provided on this After Visit Summary.  MyChart is used to connect with patients for Virtual Visits (Telemedicine).  Patients are able to view lab/test results, encounter notes, upcoming appointments, etc.  Non-urgent messages can be sent to your provider as well.   To learn more about what you can do with MyChart, go to [email or web address].    Your next appointment:   3 month(s)  The format for your next appointment:   In Person  Provider:   Ambrose Pancoast, NP

## 2021-10-19 ENCOUNTER — Other Ambulatory Visit: Payer: Self-pay | Admitting: Nephrology

## 2021-10-19 ENCOUNTER — Ambulatory Visit (HOSPITAL_COMMUNITY): Payer: Commercial Managed Care - PPO | Attending: Nurse Practitioner

## 2021-10-19 DIAGNOSIS — N179 Acute kidney failure, unspecified: Secondary | ICD-10-CM

## 2021-10-19 DIAGNOSIS — I428 Other cardiomyopathies: Secondary | ICD-10-CM | POA: Diagnosis present

## 2021-10-19 DIAGNOSIS — I129 Hypertensive chronic kidney disease with stage 1 through stage 4 chronic kidney disease, or unspecified chronic kidney disease: Secondary | ICD-10-CM

## 2021-10-19 DIAGNOSIS — I7782 Antineutrophilic cytoplasmic antibody (ANCA) vasculitis: Secondary | ICD-10-CM

## 2021-10-19 LAB — ECHOCARDIOGRAM COMPLETE
Area-P 1/2: 3.23 cm2
S' Lateral: 3.5 cm

## 2021-10-19 MED ORDER — PERFLUTREN LIPID MICROSPHERE
3.0000 mL | INTRAVENOUS | Status: AC | PRN
  Administered 2021-10-19: 3 mL via INTRAVENOUS

## 2021-10-21 ENCOUNTER — Telehealth: Payer: Self-pay | Admitting: Nurse Practitioner

## 2021-10-21 NOTE — Telephone Encounter (Signed)
Marylu Lund., NP  P Cv Div [facility] Triage Please let [name] know that her echo results show no reduction in her heart pumping function compared to her previous study in 2020.  There is normal valve functioning with no leakiness on the right or left side.  There is a trace amount of fluid around the heart that we will continue to monitor.  Please let me know if you have any additional questions or concerns.  Continue current treatment plan as discussed during our follow-up visit.   Ambrose Pancoast, NP    Called patient with results.

## 2021-10-21 NOTE — Telephone Encounter (Signed)
Follow Up:       Patient is returning nurse call from yesterday, concerning her Echo results.

## 2021-10-26 ENCOUNTER — Ambulatory Visit
Admission: RE | Admit: 2021-10-26 | Discharge: 2021-10-26 | Disposition: A | Discharge status: Home / Self Care | Payer: Commercial Managed Care - PPO | Source: Ambulatory Visit | Attending: Nephrology | Admitting: Nephrology

## 2021-10-26 DIAGNOSIS — N179 Acute kidney failure, unspecified: Secondary | ICD-10-CM

## 2021-10-26 DIAGNOSIS — I7782 Antineutrophilic cytoplasmic antibody (ANCA) vasculitis: Secondary | ICD-10-CM

## 2021-10-26 DIAGNOSIS — I129 Hypertensive chronic kidney disease with stage 1 through stage 4 chronic kidney disease, or unspecified chronic kidney disease: Secondary | ICD-10-CM

## 2021-10-27 ENCOUNTER — Other Ambulatory Visit: Payer: Self-pay | Admitting: Nurse Practitioner

## 2021-10-27 DIAGNOSIS — K7400 Hepatic fibrosis, unspecified: Secondary | ICD-10-CM

## 2021-10-27 DIAGNOSIS — K760 Fatty (change of) liver, not elsewhere classified: Secondary | ICD-10-CM

## 2021-11-11 ENCOUNTER — Ambulatory Visit: Payer: Commercial Managed Care - PPO | Admitting: Internal Medicine

## 2021-11-11 ENCOUNTER — Encounter: Payer: Self-pay | Admitting: Internal Medicine

## 2021-11-11 VITALS — BP 128/80 | HR 83 | Temp 97.7°F | Ht 68.5 in | Wt 327.8 lb

## 2021-11-11 DIAGNOSIS — I7782 Antineutrophilic cytoplasmic antibody (ANCA) vasculitis: Secondary | ICD-10-CM | POA: Diagnosis not present

## 2021-11-11 DIAGNOSIS — R0600 Dyspnea, unspecified: Secondary | ICD-10-CM | POA: Diagnosis not present

## 2021-11-11 NOTE — Progress Notes (Signed)
OV 11/11/2021  Subjective:  Patient ID: [name], female , DOB: January 22, 1960 , age 61 y.o. , MRN: [id number] , ADDRESS: [address] [place]  [address] PCP Christain Sacramento, MD Patient Care Team: Christain Sacramento, MD as PCP - General (Family Medicine) Josue Hector, MD as PCP - Cardiology (Cardiology) Brand Males, MD as Consulting Physician (Pulmonary Disease) Lahoma Rocker, MD as Referring Physician (Rheumatology) Georgeann Oppenheim, MD as Consulting Physician (Ophthalmology)  This Provider for this visit: Treatment Team:  Attending Provider: Brand Males, MD    11/11/2021 -   Chief Complaint  Patient presents with   Follow-up    Pt states since last visit, she is still having problems with her breathing and also states that she stays fatigued.     HPI [name] 61 y.o. -returns for follow-up.  She has a history of ANCA vasculitis with pulmonary hemorrhage in 2015.  After that she was on Rituxan for some years complicated by systolic heart failure but now back to baseline.  I personally not seen in a few years.  She is here today because back in February 2023 her kidney function was normal.  She is being maintained on immunosuppression through rheumatology at [facility] and by Dr. Graylon Gunning and renal is monitoring her.  Apparently in August 2023 her creatinine went up and then she was advised to come here just to be on the safe side for pulmonary.  Her last CT scan of the chest was 3 years ago and clear.  She is not having any hemoptysis.  She does have baseline shortness of breath which people attributing to obesity but she denies this from obesity anymore because she is on Ozempic and has lost weight.  She prefers to get a CT scan of the chest to make sure there is no vasculitis coming back to her lungs.  She is also having some cough and wheezing occasionally.    CT Chest data  No results found.    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[place]) (06/24/2013), Anxiety associated with depression (09/23/2012), At high risk for falls (09/23/2012), Cardiomyopathy ([place]), Chronic fatigue (05/11/2014), Chronic right-sided low back pain with right-sided sciatica (12/26/2016), Colitis, Constipation, chronic (09/27/2012), Diabetes mellitus without complication ([place]), 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), Diverticulitis of large intestine with perforation (09/23/2012), Dyspnea (05/11/2014), GERD (gastroesophageal reflux disease), Hemoptysis (06/23/2013), History of endometriosis (1997), tobacco use, presenting hazards to health (09/23/2012), Hyperglycemia (07/24/2013), Hypertension, Hypokalemia (07/20/2013), ILD (interstitial lung disease) ([place]) (01/22/2014), Leukocytosis (05/11/2014), MPA (microscopic polyangiitis) ([place]) (2015), Numbness, Obesity (BMI 30-39.9) (09/23/2012), Paresthesia (12/26/2016), Perforation of sigmoid colon - stercoral (09/27/2012), Physical deconditioning (04/30/2015), Pulmonary alveolar hemorrhage (09/03/2013), Respiratory failure with hypoxia ([place]) (06/23/2013), and Right leg swelling (12/26/2016).   reports that she quit smoking about 20 years ago. Her smoking use included cigarettes. She has a 27.00 pack-year  smoking history. She has never used smokeless tobacco.  Past Surgical History:  Procedure Laterality Date   APPENDECTOMY     BLADDER REPAIR     DILATATION & CURETTAGE/HYSTEROSCOPY WITH MYOSURE N/A 06/03/2019   Procedure: DILATATION & CURETTAGE/HYSTEROSCOPY WITH MYOSURE;  Surgeon:  Everlene Farrier, MD;  Location: [facility];  Service: Gynecology;  Laterality: N/A;   IR CATHETER TUBE CHANGE  06/24/2019   IR FLUORO GUIDE CV LINE RIGHT  06/16/2019   IR RADIOLOGIST EVAL & MGMT  07/29/2019   IR US GUIDE VASC ACCESS RIGHT  06/16/2019   LAPAROSCOPY  1997   dx of endometriosis   LAPAROSCOPY  04/28/2000    Laparoscopy with lysis of adhesions, hysteroscopy, D&C.   TONSILLECTOMY      Allergies  Allergen Reactions   Guaifenesin Nausea And Vomiting and Other (See Comments)    un   Mucinex [Guaifenesin Er] Nausea And Vomiting   Sulfa Antibiotics Nausea And Vomiting    Immunization History  Administered Date(s) Administered   Influenza Inj Mdck Quad Pf 10/26/2021   Influenza Split 10/09/2016, 09/20/2017   Influenza,inj,Quad PF,6+ Mos 11/03/2015, 09/15/2016, 09/26/2018   Influenza,inj,Quad PF,6-35 Mos 09/26/2018   Influenza,inj,quad, With Preservative 11/03/2015   Influenza-Unspecified 09/20/2017, 10/04/2020   PFIZER Comirnaty(Gray Top)Covid-19 Tri-Sucrose Vaccine 03/22/2019, 04/10/2019   PFIZER(Purple Top)SARS-COV-2 Vaccination 03/27/2019, 04/17/2019   Pneumococcal Conjugate-13 11/02/2017   Pneumococcal Polysaccharide-23 10/02/2018   Tdap 03/29/2012    Family History  Problem Relation Age of Onset   Diabetes Mother        AODM   Diabetes Father        AODM   Hypertension Father    Obesity Father    Breast cancer Paternal Grandmother    Birth defects Cousin    CAD Other        [facility]     Current Outpatient Medications:    albuterol (VENTOLIN HFA) 108 (90 Base) MCG/ACT inhaler, SMARTSIG:2 Puff(s) By Mouth Every 6 Hours PRN, Disp: , Rfl:    ALPRAZolam (XANAX) 1 MG tablet, Take 1 tablet (1 mg total) by mouth 3 (three) times daily as needed for anxiety., Disp: 5 tablet, Rfl: 0   buPROPion (WELLBUTRIN XL) 150 MG 24 hr tablet, Take 450 mg by mouth daily., Disp: , Rfl:    carvedilol (COREG) 12.5 MG tablet, Take 1 tablet (12.5 mg total) by mouth 2 (two) times daily., Disp: 60  tablet, Rfl: 11   citalopram (CELEXA) 40 MG tablet, Take 40 mg by mouth daily., Disp: , Rfl:    esomeprazole (NEXIUM) 20 MG capsule, 2 capsules, Disp: , Rfl:    folic acid (FOLVITE) 1 MG tablet, Take 1 mg by mouth daily. , Disp: , Rfl:    gabapentin (NEURONTIN) 400 MG capsule, Take 400 mg by mouth at bedtime as needed (pain). , Disp: , Rfl:    Multiple Vitamin (MULTIVITAMIN) tablet, Take 2 tablets by mouth daily. , Disp: , Rfl:    mycophenolate (CELLCEPT) 500 MG tablet, Take 3 tablets (1,500 mg total) by mouth 2 (two) times daily., Disp: , Rfl:    nitroGLYCERIN (NITROSTAT) 0.4 MG SL tablet, Place 1 tablet (0.4 mg total) under the tongue every 5 (five) minutes as needed for chest pain., Disp: 90 tablet, Rfl: 3   OZEMPIC, 1 MG/DOSE, 4 MG/3ML SOPN, INJECT 1 MG SUBCUTANEOUS EVERY 7 DAYS FOR DIABETES AND WEIGHT CONTROL, Disp: , Rfl:    phentermine (ADIPEX-P) 37.5 MG tablet, Take by mouth., Disp: , Rfl:    polyethylene glycol (MIRALAX /  GLYCOLAX) packet, Take 17 g by mouth daily as needed for moderate constipation (constipation). , Disp: , Rfl:    sacubitril-valsartan (ENTRESTO) 49-51 MG, Take 1 tablet by mouth 2 (two) times daily., Disp: 60 tablet, Rfl: 11   tiZANidine (ZANAFLEX) 4 MG tablet, Take 4 mg by mouth 3 (three) times daily., Disp: , Rfl:    meloxicam (MOBIC) 15 MG tablet, Take 15 mg by mouth daily. (Patient not taking: Reported on 11/11/2021), Disp: , Rfl:       Objective:   Vitals:   11/11/21 1422  BP: 128/80  Pulse: 83  Temp: 97.7 F (36.5 C)  TempSrc: Oral  SpO2: 97%  Weight: (!) 327 lb 12.8 oz (148.7 kg)  Height: 5' 8.5" (1.74 m)    Estimated body mass index is 49.12 kg/m as calculated from the following:   Height as of this encounter: 5' 8.5" (1.74 m).   Weight as of this encounter: 327 lb 12.8 oz (148.7 kg).  @WEIGHTCHANGE @  [employer]   11/11/21 1422  Weight: (!) 327 lb 12.8 oz (148.7 kg)     Physical Exam  General: No distress. obese Neuro: Alert and  Oriented x 3. GCS 15. Speech normal Psych: Pleasant Resp:  Barrel Chest - no.  Wheeze - no, Crackles - no, No overt respiratory distress CVS: Normal heart sounds. Murmurs - no Ext: Stigmata of Connective Tissue Disease - no HEENT: Normal upper airway. PEERL +. No post nasal drip        Assessment:       ICD-10-CM   1. Dyspnea, unspecified type  R06.00     2. 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 Dyspnea, unspecified type  R06.00     2. 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 Brand Males, M.D., F.C.C.P,  Pulmonary and Critical Care Medicine Staff Physician, [place] Director - Interstitial Lung Disease  Program  Pulmonary [place] at [place], [place], [address]  Pager: [phone], If no answer or between  15:00h - 7:00h: call [phone] Telephone: [phone]  3:02 PM 11/11/2021

## 2021-11-11 NOTE — Patient Instructions (Signed)
ICD-10-CM   1. Dyspnea, unspecified type  R06.00     2. 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

## 2021-11-11 NOTE — Addendum Note (Signed)
Addended by: Lorretta Harp on: 11/11/2021 03:17 PM   Modules accepted: Orders

## 2021-11-18 ENCOUNTER — Other Ambulatory Visit: Payer: Commercial Managed Care - PPO

## 2021-11-24 ENCOUNTER — Ambulatory Visit
Admission: RE | Admit: 2021-11-24 | Discharge: 2021-11-24 | Disposition: A | Discharge status: Home / Self Care | Payer: Commercial Managed Care - PPO | Source: Ambulatory Visit | Attending: Nurse Practitioner | Admitting: Nurse Practitioner

## 2021-11-24 DIAGNOSIS — K7400 Hepatic fibrosis, unspecified: Secondary | ICD-10-CM

## 2021-11-24 DIAGNOSIS — K760 Fatty (change of) liver, not elsewhere classified: Secondary | ICD-10-CM

## 2021-12-21 NOTE — Progress Notes (Deleted)
Office Visit    Patient Name: [name] Date of Encounter: 12/21/2021  Primary Care Provider:  Christain Sacramento, MD Primary Cardiologist:  Jenkins Rouge, MD Primary Electrophysiologist: None  Chief Complaint    [name] is a 61 y.o. female with PMH of NICM s/p chemo induced, HTN, morbid obesity, DM type II, anxiety, microscopic polyangiitis who presents for 63-month follow-up cardiomyopathy and hypertension.   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   Cardiomyopathy ([place])    Chronic fatigue 05/11/2014   Chronic right-sided low back pain with right-sided sciatica 12/26/2016   Colitis    Constipation, chronic 09/27/2012   Diabetes mellitus without complication ([place])    Diffuse pulmonary alveolar hemorrhage 06/23/2013   Diverticulitis of large intestine with perforation 09/23/2012   Dyspnea 05/11/2014   GERD (gastroesophageal reflux disease)    Hemoptysis 06/23/2013   History of endometriosis 1997   Hx of tobacco use, presenting hazards to health 09/23/2012   Hyperglycemia 07/24/2013   Hypertension    Hypokalemia 07/20/2013   ILD (interstitial lung disease) ([place]) 01/22/2014   Leukocytosis 05/11/2014   MPA (microscopic polyangiitis) ([place]) 2015   Numbness    Obesity (BMI 30-39.9) 09/23/2012   Paresthesia 12/26/2016   Perforation of sigmoid colon - stercoral 09/27/2012   Physical deconditioning 04/30/2015   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   Respiratory failure with hypoxia ([place]) 06/23/2013   Right leg swelling 12/26/2016   Past Surgical History:  Procedure Laterality Date   APPENDECTOMY     BLADDER REPAIR     DILATATION & CURETTAGE/HYSTEROSCOPY WITH MYOSURE N/A  06/03/2019   Procedure: DILATATION & CURETTAGE/HYSTEROSCOPY WITH MYOSURE;  Surgeon: Everlene Farrier, MD;  Location: [facility];  Service: Gynecology;  Laterality: N/A;   IR CATHETER TUBE CHANGE  06/24/2019   IR FLUORO GUIDE CV LINE RIGHT  06/16/2019   IR RADIOLOGIST EVAL & MGMT  07/29/2019   IR US GUIDE VASC ACCESS RIGHT  06/16/2019   LAPAROSCOPY  1997   dx of endometriosis   LAPAROSCOPY  04/28/2000    Laparoscopy with lysis of adhesions, hysteroscopy, D&C.   TONSILLECTOMY      Allergies  Allergies  Allergen Reactions   Guaifenesin Nausea And Vomiting and Other (See Comments)    un   Mucinex [Guaifenesin Er] Nausea And Vomiting   Sulfa Antibiotics Nausea And Vomiting    History of Present Illness    [name] is a 61 year old female with the above-mentioned past medical history who presents today for follow-up of hypertension and cardiomyopathy.  She is currently followed at [facility] for polyangiitis and was started on rituximab in 2019.  She underwent 2D echo in 2019 that showed EF of 35-40%.  She had stress test performed 2020 that was negative for ischemia and repeat 2D echo with improved EF of 60% in 03/2018.  She was last seen by Diona Browner, NP on 03/2021 for follow-up.  During visit she appears stable from cardiac perspective and endorsed some generalized fatigue stable dyspnea.  She was euvolemic on examination politely declined repeat echo for evaluation of dyspnea.  She also reported sleep apnea and declined sleep study at that time.  She was seen for follow-up 09/22/2021 and reported experiencing chest tightness with activity but was doing better.  She also endorsed fatigue but denied dyspnea.  Patient's 2D echo was repeated and revealed no reduction in pumping function since 2020 echo.  Since last being seen in the office patient reports***.  Patient denies chest pain, palpitations, dyspnea, PND, orthopnea, nausea, vomiting, dizziness, syncope, edema, weight gain, or early  satiety.  ***Notes:  Home Medications    Current Outpatient Medications  Medication Sig Dispense Refill   albuterol (VENTOLIN HFA) 108 (90 Base) MCG/ACT inhaler SMARTSIG:2 Puff(s) By Mouth Every 6 Hours PRN     ALPRAZolam (XANAX) 1 MG tablet Take 1 tablet (1 mg total) by mouth 3 (three) times daily as needed for anxiety. 5 tablet 0   buPROPion (WELLBUTRIN XL) 150 MG 24 hr tablet Take 450 mg by mouth daily.     carvedilol (COREG) 12.5 MG tablet Take 1 tablet (12.5 mg total) by mouth 2 (two) times daily. 60 tablet 11   citalopram (CELEXA) 40 MG tablet Take 40 mg by mouth daily.     esomeprazole (NEXIUM) 20 MG capsule 2 capsules     folic acid (FOLVITE) 1 MG tablet Take 1 mg by mouth daily.      gabapentin (NEURONTIN) 400 MG capsule Take 400 mg by mouth at bedtime as needed (pain).      meloxicam (MOBIC) 15 MG tablet Take 15 mg by mouth daily. (Patient not taking: Reported on 11/11/2021)     Multiple Vitamin (MULTIVITAMIN) tablet Take 2 tablets by mouth daily.      mycophenolate (CELLCEPT) 500 MG tablet Take 3 tablets (1,500 mg total) by mouth 2 (two) times daily.     nitroGLYCERIN (NITROSTAT) 0.4 MG SL tablet Place 1 tablet (0.4 mg total) under the tongue every 5 (five) minutes as needed for chest pain. 90 tablet 3   OZEMPIC, 1 MG/DOSE, 4 MG/3ML SOPN INJECT 1 MG SUBCUTANEOUS EVERY 7 DAYS FOR DIABETES AND WEIGHT CONTROL     phentermine (ADIPEX-P) 37.5 MG tablet Take by mouth.     polyethylene glycol (MIRALAX / GLYCOLAX) packet Take 17 g by mouth daily as needed for moderate constipation (constipation).      sacubitril-valsartan (ENTRESTO) 49-51 MG Take 1 tablet by mouth 2 (two) times daily. 60 tablet 11   tiZANidine (ZANAFLEX) 4 MG tablet Take 4 mg by mouth 3 (three) times daily.     No current facility-administered medications for this visit.     Review of Systems  Please see the history of present illness.    (+)*** (+)***  All other systems reviewed and are otherwise negative except  as noted above.  Physical Exam    Wt Readings from Last 3 Encounters:  11/11/21 (!) 327 lb 12.8 oz (148.7 kg)  09/22/21 (!) 331 lb (150.1 kg)  03/10/21 (!) 357 lb 14.4 oz (162.3 kg)   MO:QHUTM were no vitals filed for this visit.,There is no height or weight on file to calculate BMI.  Constitutional:      Appearance: Healthy appearance. Not in distress.  Neck:     Vascular: JVD normal.  Pulmonary:     Effort: Pulmonary effort is normal.     Breath sounds: No wheezing. No rales. Diminished in the bases Cardiovascular:     Normal rate. Regular rhythm. Normal S1. Normal S2.      Murmurs: There is no murmur.  Edema:    Peripheral edema absent.  Abdominal:     Palpations:  Abdomen is soft non tender. There is no hepatomegaly.  Skin:    General: Skin is warm and dry.  Neurological:     General: No focal deficit present.     Mental Status: Alert and oriented to person, place and time.     Cranial Nerves: Cranial nerves are intact.  EKG/LABS/Other Studies Reviewed    ECG personally reviewed by me today - ***  Risk Assessment/Calculations:   {Does this patient have ATRIAL FIBRILLATION?[phone]}        Lab Results  Component Value Date   WBC 11.7 (H) 06/26/2019   HGB 9.1 (L) 06/26/2019   HCT 30.4 (L) 06/26/2019   MCV 87.6 06/26/2019   PLT 231 06/26/2019   Lab Results  Component Value Date   CREATININE 1.08 (H) 06/30/2019   BUN 13 06/30/2019   NA 141 06/30/2019   K 3.5 06/30/2019   CL 106 06/30/2019   CO2 26 06/30/2019   Lab Results  Component Value Date   ALT 22 06/18/2019   AST 38 06/18/2019   ALKPHOS 102 06/18/2019   BILITOT 0.5 06/18/2019   Lab Results  Component Value Date   TRIG 159 (H) 09/30/2012    Lab Results  Component Value Date   HGBA1C 7.0 (H) 05/30/2019    Assessment & Plan       1.  NICM: -EF was reduced in 2019 due to rituximab therapy for her ANCA+ vasculitis.  -2D echo with improved EF of 60% in 03/2018  -Today patient is  euvolemic on examination -Continue GDMT with carvedilol, and Entresto -Patient endorses fatigue but denies dyspnea on exertion -We will repeat 2D echo to evaluate fatigue and current heart function since adding GDMT.   2.  Hypertension: -Blood pressure today was well controlled at 106/64 -Continue carvedilol 12.5 mg twice daily   3.  Chronic dyspnea: -Today patient reports no dyspnea on exertion -Patient declines sleep study   4.  Microscopic polyangiitis:  -H/o ANCA+ vasculitis. No longer following with [facility], follows with rheumatology.   5.  DM type II: -Patient has had significant weight loss on Ozempic -Continue current treatment plan per PCP      Disposition: Follow-up with Jenkins Rouge, MD or APP in *** months {Are you ordering a CV Procedure (e.g. stress test, cath, DCCV, TEE, etc)?   Press F2        :[id number]}   Medication Adjustments/Labs and Tests Ordered: Current medicines are reviewed at length with the patient today.  Concerns regarding medicines are outlined above.   Signed, Mable Fill, Marissa Nestle, NP 12/21/2021, 7:39 PM [facility]  Note:  This document was prepared using Dragon voice recognition software and may include unintentional dictation errors.

## 2021-12-22 ENCOUNTER — Ambulatory Visit: Payer: Commercial Managed Care - PPO | Admitting: Nurse Practitioner

## 2021-12-22 DIAGNOSIS — I10 Essential (primary) hypertension: Secondary | ICD-10-CM

## 2022-01-17 ENCOUNTER — Ambulatory Visit
Admission: RE | Admit: 2022-01-17 | Discharge: 2022-01-17 | Disposition: A | Discharge status: Home / Self Care | Payer: No Typology Code available for payment source | Source: Ambulatory Visit | Attending: Internal Medicine | Admitting: Internal Medicine

## 2022-01-17 DIAGNOSIS — R0600 Dyspnea, unspecified: Secondary | ICD-10-CM

## 2022-01-20 ENCOUNTER — Ambulatory Visit: Payer: No Typology Code available for payment source | Admitting: Internal Medicine

## 2022-01-20 ENCOUNTER — Ambulatory Visit (INDEPENDENT_AMBULATORY_CARE_PROVIDER_SITE_OTHER): Payer: No Typology Code available for payment source | Admitting: Internal Medicine

## 2022-01-20 ENCOUNTER — Encounter: Payer: Self-pay | Admitting: Internal Medicine

## 2022-01-20 VITALS — BP 124/74 | HR 85 | Temp 98.2°F | Ht 68.0 in | Wt 312.0 lb

## 2022-01-20 DIAGNOSIS — R0600 Dyspnea, unspecified: Secondary | ICD-10-CM

## 2022-01-20 DIAGNOSIS — I7782 Antineutrophilic cytoplasmic antibody (ANCA) vasculitis: Secondary | ICD-10-CM

## 2022-01-20 DIAGNOSIS — Z87891 Personal history of nicotine dependence: Secondary | ICD-10-CM

## 2022-01-20 DIAGNOSIS — J439 Emphysema, unspecified: Secondary | ICD-10-CM | POA: Diagnosis not present

## 2022-01-20 DIAGNOSIS — I2510 Atherosclerotic heart disease of native coronary artery without angina pectoris: Secondary | ICD-10-CM

## 2022-01-20 LAB — PULMONARY FUNCTION TEST
DL/VA % pred: 139 %
DL/VA: 5.68 ml/min/mmHg/L
DLCO cor % pred: 101 %
DLCO cor: 23.32 ml/min/mmHg
DLCO unc % pred: 101 %
DLCO unc: 23.32 ml/min/mmHg
FEF 25-75 Post: 1.97 L/sec
FEF 25-75 Pre: 1.98 L/sec
FEF2575-%Change-Post: 0 %
FEF2575-%Pred-Post: 77 %
FEF2575-%Pred-Pre: 77 %
FEV1-%Change-Post: 0 %
FEV1-%Pred-Post: 66 %
FEV1-%Pred-Pre: 65 %
FEV1-Post: 1.93 L
FEV1-Pre: 1.92 L
FEV1FVC-%Change-Post: -6 %
FEV1FVC-%Pred-Pre: 107 %
FEV6-%Change-Post: 5 %
FEV6-%Pred-Post: 66 %
FEV6-%Pred-Pre: 62 %
FEV6-Post: 2.43 L
FEV6-Pre: 2.29 L
FEV6FVC-%Pred-Post: 103 %
FEV6FVC-%Pred-Pre: 103 %
FVC-%Change-Post: 7 %
FVC-%Pred-Post: 65 %
FVC-%Pred-Pre: 60 %
FVC-Post: 2.46 L
FVC-Pre: 2.29 L
Post FEV1/FVC ratio: 78 %
Post FEV6/FVC ratio: 100 %
Pre FEV1/FVC ratio: 84 %
Pre FEV6/FVC Ratio: 100 %
RV % pred: 76 %
RV: 1.69 L
TLC % pred: 84 %
TLC: 4.77 L

## 2022-01-20 IMAGING — DX DG CHEST 2V
2 series · 2 of 2 positions shown · non-contrast
Comparison: 01/08/2014

CLINICAL DATA: Surgical clearance endometrial of lesion history of
smoking

EXAM:
CHEST - 2 VIEW

[chest pa]
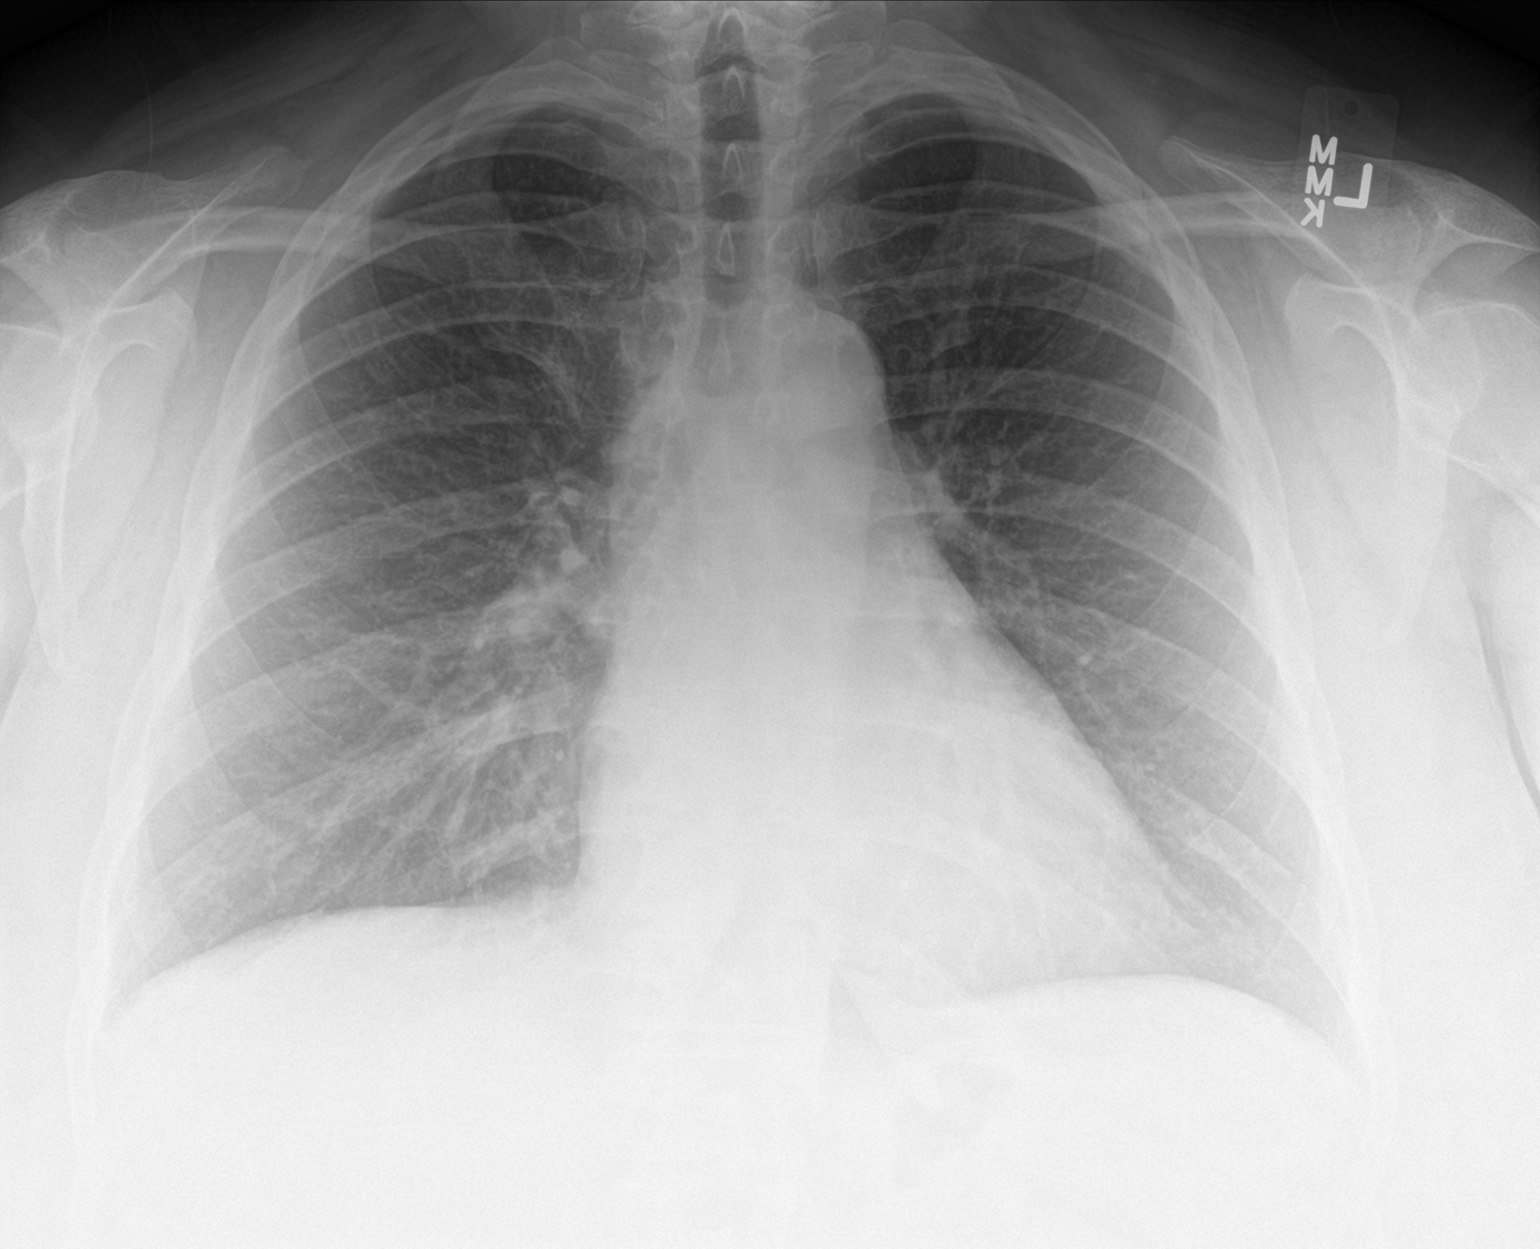

[chest lat]
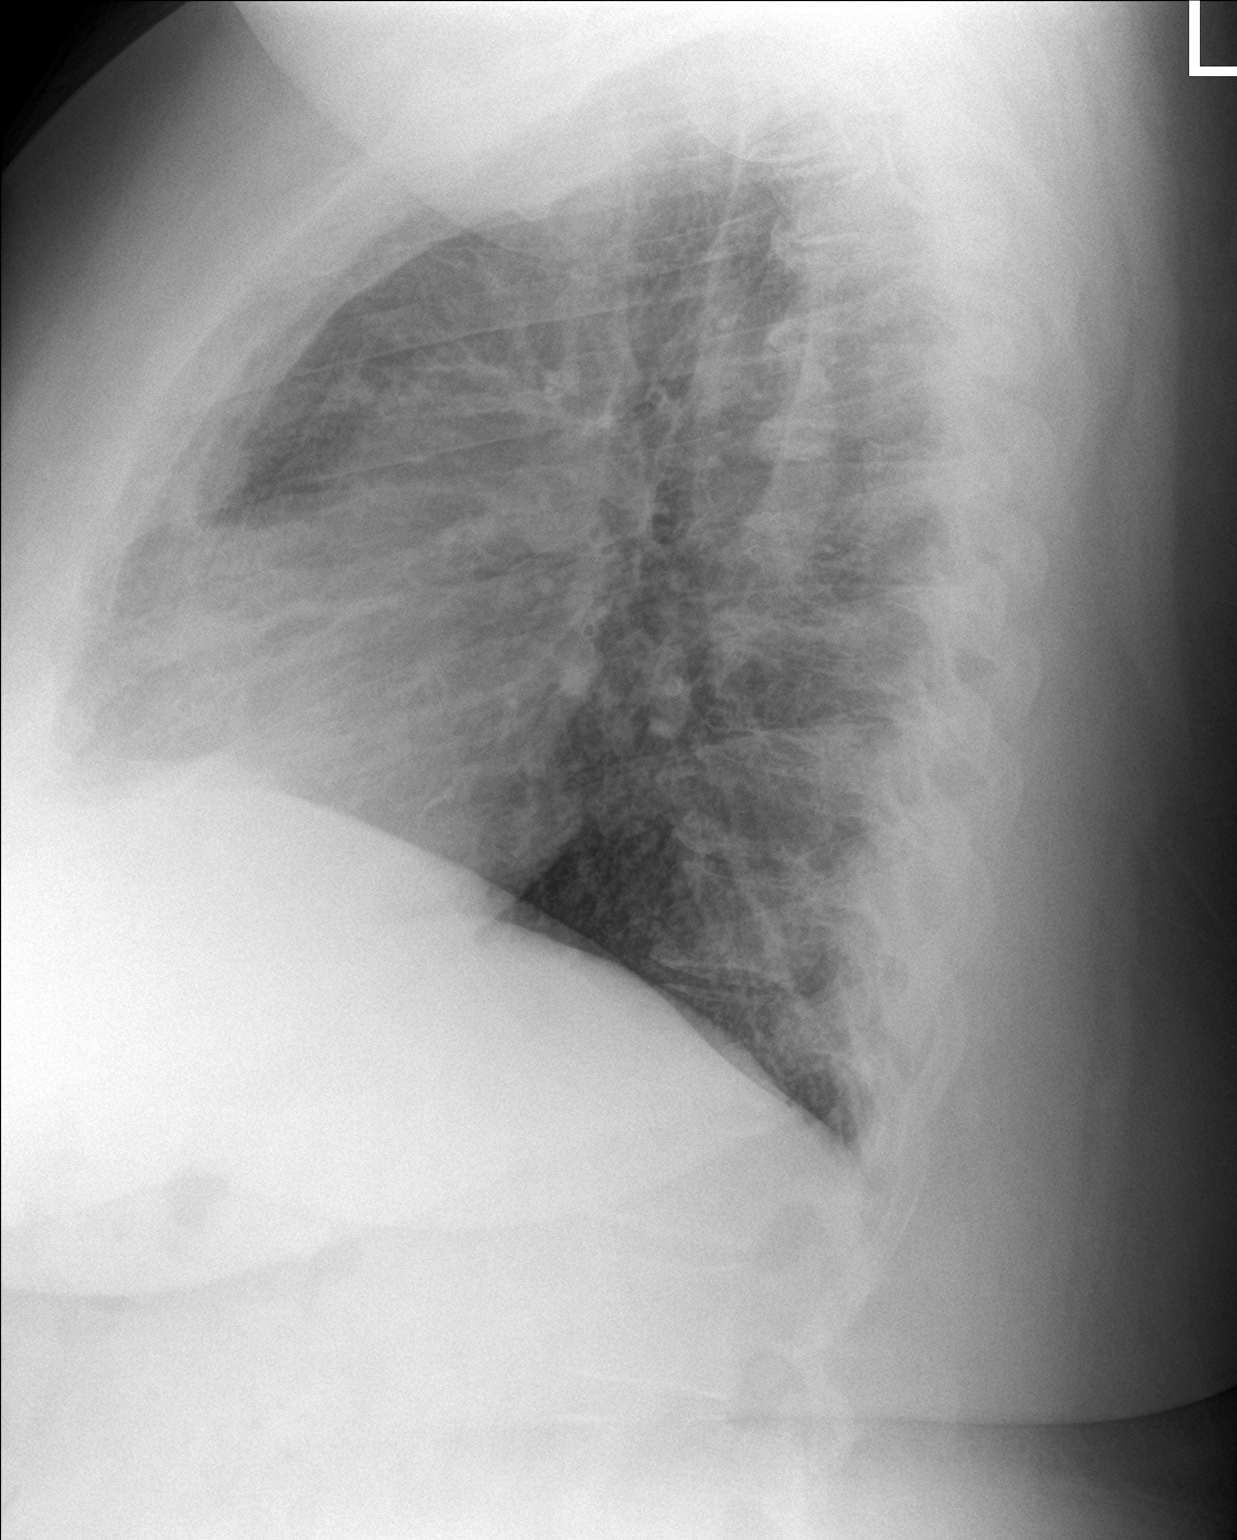

[2 of 2 positions shown; findings below may reference images not displayed]

FINDINGS: Mild chronic bronchitic changes. Normal cardiomediastinal
silhouette. No focal opacity or pleural effusion.
IMPRESSION: No active cardiopulmonary disease.  Mild chronic bronchitic changes

## 2022-01-20 MED ORDER — SPIRIVA RESPIMAT 1.25 MCG/ACT IN AERS: 2.0000 | INHALATION_SPRAY | Freq: Every day | RESPIRATORY_TRACT | 11 refills | Status: DC

## 2022-01-20 MED ORDER — SPIRIVA RESPIMAT 1.25 MCG/ACT IN AERS: 2.0000 | INHALATION_SPRAY | Freq: Every day | RESPIRATORY_TRACT | 0 refills | Status: DC

## 2022-01-20 NOTE — Patient Instructions (Addendum)
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[place])  -No evidence of vasculitis in the lungs and CT scan of the chest January 2024  Plan - Vasculitis management according to renal  Pulmonary emphysema, unspecified emphysema type ([place]) Stopped smoking with greater than 25 pack year history   -Very mild pulmonary emphysema reported on high-resolution CT chest January 2024  Plan - Try sample Spiriva Respimat 2 puffs once daily; if this is helping you please call and we will send in the prescription -Referral lung cancer screening program [he might be eligible    Coronary artery calcification seen on CAT scan -normal cardiac stress test in 2020  Plan - According to primary care physician  Follow-up -1 year or sooner if needed

## 2022-01-20 NOTE — Progress Notes (Signed)
OV 11/11/2021  Subjective:  Patient ID: [name], female , DOB: 05/01/1960 , age 62 y.o. , MRN: [id number] , ADDRESS: [address] [place] [address] PCP Christain Sacramento, MD Patient Care Team: Christain Sacramento, MD as PCP - General (Family Medicine) Josue Hector, MD as PCP - Cardiology (Cardiology) Brand Males, MD as Consulting Physician (Pulmonary Disease) Lahoma Rocker, MD as Referring Physician (Rheumatology) Georgeann Oppenheim, MD as Consulting Physician (Ophthalmology)  This Provider for this visit: Treatment Team:  Attending Provider: Brand Males, MD    11/11/2021 -   Chief Complaint  Patient presents with   Follow-up    Pt states since last visit, she is still having problems with her breathing and also states that she stays fatigued.     HPI [name] 62 y.o. -returns for follow-up.  She has a history of ANCA vasculitis with pulmonary hemorrhage in 2015.  After that she was on Rituxan for some years complicated by systolic heart failure but now back to baseline.  I personally not seen in a few years.  She is here today because back in February 2023 her kidney function was normal.  She is being maintained on immunosuppression through rheumatology at [facility] and by Dr. Graylon Gunning and renal is monitoring her.  Apparently in August 2023 her creatinine went up and then she was advised to come here just to be on the safe side for pulmonary.  Her last CT scan of the chest was 3 years ago and clear.  She is not having any hemoptysis.  She does have baseline shortness of breath which people attributing to obesity but she denies this from obesity anymore because she is on Ozempic and has lost weight.  She prefers to get a CT scan of the chest to make sure there is no vasculitis coming back to her lungs.  She is also having some cough and wheezing occasionally.      OV 01/20/2022  Subjective:  Patient ID: [name], female , DOB:  15-Jul-1960 , age 62 y.o. , MRN: [id number] , ADDRESS: [address] [place] [address] PCP Christain Sacramento, MD Patient Care Team: Christain Sacramento, MD as PCP - General (Family Medicine) Josue Hector, MD as PCP - Cardiology (Cardiology) Brand Males, MD as Consulting Physician (Pulmonary Disease) Lahoma Rocker, MD as Referring Physician (Rheumatology) Georgeann Oppenheim, MD as Consulting Physician (Ophthalmology)  This Provider for this visit: Treatment Team:  Attending Provider: Brand Males, MD    01/20/2022 -   Chief Complaint  Patient presents with   Follow-up    PFT done today. Breathing has been worse since the last visit. She gets winded when she bends over.    Follow-up remote history of ANCA vasculitis Follow-up remote history of smoking Morbid obesity  HPI [name] 62 y.o. -turns for follow-up.  She is here to follow-up on her test results.  She did have high-resolution CT chest that I personally visualized and showed it to her.  I agree with the formal interpretation.  She does not have any ILD.  No cancer no pneumonia.  According to the CT report she has mild emphysema it is not easily apparent in our CT scan.  There is definitely no vasculitis.  She was shocked and impressed by these results.  She was in disbelief.  She was wondering why she was on continued immunosuppression.  I did indicate to her that the reason she  does not have any active vasculitis is because of immunosuppression and she should take this that she up with Dr. Posey Pronto her nephrologist.  Review of the records indicate she does have more than 20 pack smoking history.  She might be eligible for American [employer] Society expanded criteria for low-dose lung cancer screening.  I will make a referral although the criteria by the [place] prevention task force is a lot more restrictive.  We talked about the subtle emphysema [DLCO is normal].  Pulmonary function test was compromised by her obesity.  Offered  her Spiriva sample.  She is going to try it.  Told her to continue with weight loss which has been doing a good job.  Noted the diagnose of emphysema is going to be new  CT Chest data  CT Chest High Resolution  Result Date: 01/18/2022 CLINICAL DATA:  Shortness of breath with exertion.  Former smoker. EXAM: CT CHEST WITHOUT CONTRAST TECHNIQUE: Multidetector CT imaging of the chest was performed following the standard protocol without intravenous contrast. High resolution imaging of the lungs, as well as inspiratory and expiratory imaging, was performed. RADIATION DOSE REDUCTION: This exam was performed according to the departmental dose-optimization program which includes automated exposure control, adjustment of the mA and/or kV according to patient size and/or use of iterative reconstruction technique. COMPARISON:  07/24/2018. FINDINGS: Cardiovascular: Left anterior descending coronary artery calcification. Heart size normal. No pericardial effusion. Mediastinum/Nodes: No pathologically enlarged mediastinal or axillary lymph nodes. Hilar regions are difficult to definitively evaluate without IV contrast. Esophagus is grossly unremarkable. Lungs/Pleura: Centrilobular emphysema. Negative for subpleural reticulation, traction bronchiectasis/bronchiolectasis, ground-glass, architectural distortion or honeycombing. Minimal bibasilar subpleural scarring. Lungs are otherwise clear. No pleural fluid. Minimal air trapping. Upper Abdomen: Visualized portions of the liver, adrenal glands, spleen, pancreas, stomach and bowel are grossly unremarkable. Musculoskeletal: Degenerative changes in the spine. No worrisome lytic or sclerotic lesions. IMPRESSION: 1. No evidence of interstitial lung disease. 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 2. Left anterior descending coronary artery calcification. 3.  Emphysema (ICD10-J43.9). Electronically Signed   By: Lorin Picket M.D.   On: 01/18/2022 09:29       PFT     Latest Ref Rng & Units 01/20/2022    1:52 PM 01/22/2014    4:04 PM  PFT Results  FVC-Pre L 2.29  P 2.12   FVC-Predicted Pre % 60  P 53   FVC-Post L 2.46  P 1.91   FVC-Predicted Post % 65  P 48   Pre FEV1/FVC % % 84  P 86   Post FEV1/FCV % % 78  P 91   FEV1-Pre L 1.92  P 1.82   FEV1-Predicted Pre % 65  P 58   FEV1-Post L 1.93  P 1.73   DLCO uncorrected ml/min/mmHg 23.32  P 19.07   DLCO UNC% % 101  P 64   DLCO corrected ml/min/mmHg 23.32  P   DLCO COR %Predicted % 101  P   DLVA Predicted % 139  P 96   TLC L 4.77  P   TLC % Predicted % 84  P   RV % Predicted % 76  P     P Preliminary result       has a past medical history of Acute blood loss anemia (06/23/2013), 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[place]), 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[place]) (06/24/2013), Anxiety associated with depression (09/23/2012), At high risk for falls (09/23/2012), Cardiomyopathy ([place]), Chronic fatigue (05/11/2014), Chronic right-sided low back pain with right-sided sciatica (12/26/2016),  Colitis, Constipation, chronic (09/27/2012), Diabetes mellitus without complication ([place]), 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), Diverticulitis of large intestine with perforation (09/23/2012), Dyspnea (05/11/2014), GERD (gastroesophageal reflux disease), Hemoptysis (06/23/2013), History of endometriosis (1997), tobacco use, presenting hazards to health (09/23/2012), Hyperglycemia (07/24/2013), Hypertension, Hypokalemia (07/20/2013), ILD (interstitial lung disease) ([place]) (01/22/2014), Leukocytosis (05/11/2014), 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   reports that she quit smoking about 21 years ago. Her smoking use included cigarettes.  She has a 27.00 pack-year smoking history. She has never used smokeless tobacco.  Past Surgical History:  Procedure Laterality Date   APPENDECTOMY     BLADDER REPAIR     DILATATION & CURETTAGE/HYSTEROSCOPY WITH MYOSURE N/A 06/03/2019   Procedure: DILATATION & CURETTAGE/HYSTEROSCOPY WITH MYOSURE;  Surgeon: Everlene Farrier, MD;  Location: [facility];  Service: Gynecology;  Laterality: N/A;   IR CATHETER TUBE CHANGE  06/24/2019   IR FLUORO GUIDE CV LINE RIGHT  06/16/2019   IR RADIOLOGIST EVAL & MGMT  07/29/2019   IR US GUIDE VASC ACCESS RIGHT  06/16/2019   LAPAROSCOPY  1997   dx of endometriosis   LAPAROSCOPY  04/28/2000    Laparoscopy with lysis of adhesions, hysteroscopy, D&C.   TONSILLECTOMY      Allergies  Allergen Reactions   Guaifenesin Nausea And Vomiting and Other (See Comments)    un   Mucinex [Guaifenesin Er] Nausea And Vomiting   Sulfa Antibiotics Nausea And Vomiting    Immunization History  Administered Date(s) Administered   Influenza Inj Mdck Quad Pf 10/26/2021   Influenza Split 10/09/2016, 09/20/2017   Influenza,inj,Quad PF,6+ Mos 11/03/2015, 09/15/2016, 09/26/2018   Influenza,inj,Quad PF,6-35 Mos 09/26/2018   Influenza,inj,quad, With Preservative 11/03/2015   Influenza-Unspecified 09/20/2017, 10/04/2020   PFIZER Comirnaty(Gray Top)Covid-19 Tri-Sucrose Vaccine 03/22/2019, 04/10/2019   PFIZER(Purple Top)SARS-COV-2 Vaccination 03/27/2019, 04/17/2019   Pneumococcal Conjugate-13 11/02/2017   Pneumococcal Polysaccharide-23 10/02/2018   Tdap 03/29/2012    Family History  Problem Relation Age of Onset   Diabetes Mother        AODM   Diabetes Father        AODM   Hypertension Father    Obesity Father    Breast cancer Paternal Grandmother    Birth defects Cousin    CAD Other        [facility]     Current Outpatient Medications:    ALPRAZolam (XANAX) 1 MG tablet, Take 1 tablet (1 mg total) by mouth 3 (three) times daily as needed for anxiety., Disp: 5 tablet, Rfl: 0    buPROPion (WELLBUTRIN XL) 150 MG 24 hr tablet, Take 450 mg by mouth daily., Disp: , Rfl:    carvedilol (COREG) 12.5 MG tablet, Take 1 tablet (12.5 mg total) by mouth 2 (two) times daily., Disp: 60 tablet, Rfl: 11   citalopram (CELEXA) 40 MG tablet, Take 40 mg by mouth daily., Disp: , Rfl:    esomeprazole (NEXIUM) 20 MG capsule, 2 capsules, Disp: , Rfl:    folic acid (FOLVITE) 1 MG tablet, Take 1 mg by mouth daily. , Disp: , Rfl:    gabapentin (NEURONTIN) 600 MG tablet, Take 600 mg by mouth 2 (two) times daily., Disp: , Rfl:    methotrexate (50 MG/ML) 1 g injection, Inject 0.6 mg into the vein every 7 (seven) days., Disp: , Rfl:    Multiple Vitamin (MULTIVITAMIN) tablet,  Take 2 tablets by mouth daily. , Disp: , Rfl:    mycophenolate (CELLCEPT) 500 MG tablet, Take 3 tablets (1,500 mg total) by mouth 2 (two) times daily., Disp: , Rfl:    nitroGLYCERIN (NITROSTAT) 0.4 MG SL tablet, Place 1 tablet (0.4 mg total) under the tongue every 5 (five) minutes as needed for chest pain., Disp: 90 tablet, Rfl: 3   OZEMPIC, 1 MG/DOSE, 4 MG/3ML SOPN, INJECT 1 MG SUBCUTANEOUS EVERY 7 DAYS FOR DIABETES AND WEIGHT CONTROL, Disp: , Rfl:    polyethylene glycol (MIRALAX / GLYCOLAX) packet, Take 17 g by mouth daily as needed for moderate constipation (constipation). , Disp: , Rfl:    sacubitril-valsartan (ENTRESTO) 49-51 MG, Take 1 tablet by mouth 2 (two) times daily., Disp: 60 tablet, Rfl: 11   tiZANidine (ZANAFLEX) 4 MG tablet, Take 4 mg by mouth 3 (three) times daily., Disp: , Rfl:    albuterol (VENTOLIN HFA) 108 (90 Base) MCG/ACT inhaler, SMARTSIG:2 Puff(s) By Mouth Every 6 Hours PRN (Patient not taking: Reported on 01/20/2022), Disp: , Rfl:    phentermine (ADIPEX-P) 37.5 MG tablet, Take by mouth. (Patient not taking: Reported on 01/20/2022), Disp: , Rfl:       Objective:   Vitals:   01/20/22 1504  BP: 124/74  Pulse: 85  Temp: 98.2 F (36.8 C)  TempSrc: Oral  SpO2: 98%  Weight: (!) 312 lb (141.5 kg)  Height:  5\' 8"  (1.727 m)    Estimated body mass index is 47.44 kg/m as calculated from the following:   Height as of this encounter: 5\' 8"  (1.727 m).   Weight as of this encounter: 312 lb (141.5 kg).  @WEIGHTCHANGE @  [employer]   01/20/22 1504  Weight: (!) 312 lb (141.5 kg)     Physical Exam   General: No distress. Looks well Neuro: Alert and Oriented x 3. GCS 15. Speech normal Psych: Pleasant Resp:  Barrel Chest - no.  Wheeze - no, Crackles - no, No overt respiratory distress CVS: Normal heart sounds. Murmurs - no Ext: Stigmata of Connective Tissue Disease - no HEENT: Normal upper airway. PEERL +. No post nasal drip        Assessment:       ICD-10-CM   1. Dyspnea, unspecified type  R06.00     2. 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[place])  I77.82     3. Pulmonary emphysema, unspecified emphysema type ([place])  J43.9     4. Stopped smoking with greater than 25 pack year history  Z87.891     5. Coronary artery calcification seen on CAT scan  I25.10          Plan:     Patient Instructions  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HCC)  -No evidence of vasculitis in the lungs and CT scan of the chest January 2024  Plan - Vasculitis management according to renal  Pulmonary emphysema, unspecified emphysema type ([place]) Stopped smoking with greater than 25 pack year history   -Very mild pulmonary emphysema reported on high-resolution CT chest January 2024  Plan - Try sample Spiriva Respimat 2 puffs once daily; if this is helping you please call and we will send in the prescription -Referral lung cancer screening program [he might be eligible    Coronary artery calcification seen on CAT scan -normal cardiac stress test in 2020  Plan - According to primary care physician  Follow-up -1 year or sooner if  needed -  Moderate Complexity MDM NEW OFFICE  The table  below is from the 2021 E/M guidelines, first released in 2021, with minor revisions added in 2023. Must meet the requirements for 2 out of 3 dimensions to qualify.    Number and complexity of problems addressed Amount and/or complexity of data reviewed Risk of complications and/or morbidity  One or more chronic illness with mild exacerbation, progression, or side effects of treatment  Two or more stable chronic illnesses  One undiagnosed new problem with uncertain prognosis  One acute illness with systemic symptoms   Acute complicated injury Must meet the requirements for 1 of 3 of the categories)  Category 1: Tests and documents, historian  Any combination of 3 of the following:  Assessment requiring an independent historian  Review of prior external records  Review of results of each unique test  Ordering of each unique test    Category 2: Interpretation of tests  Independent interpretation of a test perfromed by another physician/NPP  Category 3: Discuss management/tests  Discussion of magagement or tests with an external physician/NPP Prescription drug management  Decision regarding minor surgery with identfied patient or procedure risk factors  Decision regarding elective major surgery without identified patient or procedure risk factors  Diagnosis or treatment significantly limited by social determinants of health                SIGNATURE    Dr. Brand Males, M.D., F.C.C.P,  Pulmonary and Critical Care Medicine Staff Physician, [place] Director - Interstitial Lung Disease  Program  Pulmonary [place] [facility] [place], [place], [address]  Pager: [phone], If no answer or between  15:00h - 7:00h: call [phone] Telephone: [phone]  3:27 PM 01/20/2022

## 2022-01-20 NOTE — Patient Instructions (Signed)
Full PFT performed today.

## 2022-01-20 NOTE — Progress Notes (Signed)
Full PFT performed today.

## 2022-01-30 NOTE — Progress Notes (Signed)
Office Visit    Patient Name: [name] Date of Encounter: 01/31/2022  Primary Care Provider:  Christain Sacramento, MD Primary Cardiologist:  Jenkins Rouge, MD Primary Electrophysiologist: None  Chief Complaint    [name] is a 62 y.o. female with PMH of NICM s/p chemo induced, HTN, morbid obesity, DM type II, anxiety, microscopic polyangiitis who presents for 12-month follow-up cardiomyopathy and hypertension.   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   Diverticulitis of large intestine with perforation 09/23/2012   Dyspnea 05/11/2014   GERD (gastroesophageal reflux disease)    Hemoptysis 06/23/2013   History of endometriosis 1997   Hx of tobacco use, presenting hazards to health 09/23/2012   Hyperglycemia 07/24/2013   Hypertension    Hypokalemia 07/20/2013   ILD (interstitial lung disease) ([place]) 01/22/2014   Leukocytosis 05/11/2014   MPA (microscopic polyangiitis) ([place]) 2015   Numbness    Obesity (BMI 30-39.9) 09/23/2012   Paresthesia 12/26/2016   Perforation of sigmoid colon - stercoral 09/27/2012   Physical deconditioning 04/30/2015   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   Respiratory failure with hypoxia ([place]) 06/23/2013   Right leg swelling 12/26/2016   Past Surgical History:  Procedure Laterality Date   APPENDECTOMY     BLADDER REPAIR     DILATATION & CURETTAGE/HYSTEROSCOPY WITH MYOSURE N/A  06/03/2019   Procedure: DILATATION & CURETTAGE/HYSTEROSCOPY WITH MYOSURE;  Surgeon: Everlene Farrier, MD;  Location: [facility];  Service: Gynecology;  Laterality: N/A;   IR CATHETER TUBE CHANGE  06/24/2019   IR FLUORO GUIDE CV LINE RIGHT  06/16/2019   IR RADIOLOGIST EVAL & MGMT  07/29/2019   IR US GUIDE VASC ACCESS RIGHT  06/16/2019   LAPAROSCOPY  1997   dx of endometriosis   LAPAROSCOPY  04/28/2000    Laparoscopy with lysis of adhesions, hysteroscopy, D&C.   TONSILLECTOMY      Allergies  Allergies  Allergen Reactions   Guaifenesin Nausea And Vomiting and Other (See Comments)    un   Mucinex [Guaifenesin Er] Nausea And Vomiting   Sulfa Antibiotics Nausea And Vomiting    History of Present Illness  [name] is a 62 year old female with the above-mentioned past medical history who presents today for follow-up of hypertension and cardiomyopathy.  She is currently followed at [facility] for polyangiitis and was started on rituximab in 2019.  She underwent 2D echo in 2019 that showed EF of 35-40%.  She had stress test performed 2020 that was negative for ischemia and repeat 2D echo with improved EF of 60% in 03/2018.  She was last seen by Diona Browner, NP on 03/2021 for follow-up.  During visit she appears stable from cardiac perspective and endorsed some generalized fatigue stable dyspnea.  She was euvolemic on examination politely declined repeat echo for evaluation of dyspnea.  She also reported sleep apnea and declined sleep study at that time.  She  was seen in follow-up 09/22/2021 and reported some chest tightness however none during visit.  She was euvolemic on examination and patient endorsed some fatigue but denied any dyspnea on exertion.  2D echo was repeated to evaluate valvular and heart function.  2D echo showed EF of 60 to 65%, no RWMA, grade 1 DD with normal valvular function.   [name] presents today for 74-month follow-up of coronary artery disease alone.  Since last being seen in the office  patient reports she has been doing well but does report occasional episodes of chest pain that requires 1-2 nitroglycerin over the past 3 months.  She reports increased role strain and stress with her mother who has dementia.  Her blood pressure today was well-controlled at 102/64.  She is currently in the process of losing weight and is currently using Ozempic.  She is down 15 pounds from our visit in November 2023.  She is also currently suffering low back pain and was advised to stop taking meloxicam due to increased gastric ulcers and elevated kidney function.  We discussed at length the importance of staining from NSAIDs especially in preventing progression of cardiovascular disease.  She was recently seen by pulmonology and underwent a high-resolution CT scan that showed LAD calcifications.  Patient denies chest pain, palpitations, dyspnea, PND, orthopnea, nausea, vomiting, dizziness, syncope, edema, weight gain, or early satiety.  Home Medications    Current Outpatient Medications  Medication Sig Dispense Refill   ALPRAZolam (XANAX) 1 MG tablet Take 1 tablet (1 mg total) by mouth 3 (three) times daily as needed for anxiety. 5 tablet 0   buPROPion (WELLBUTRIN XL) 150 MG 24 hr tablet Take 450 mg by mouth daily.     carvedilol (COREG) 12.5 MG tablet Take 1 tablet (12.5 mg total) by mouth 2 (two) times daily. 60 tablet 11   citalopram (CELEXA) 40 MG tablet Take 40 mg by mouth daily.     esomeprazole (NEXIUM) 20 MG capsule 2 capsules     folic acid (FOLVITE) 1 MG tablet Take 1 mg by mouth daily.      gabapentin (NEURONTIN) 600 MG tablet Take 600 mg by mouth 2 (two) times daily.     methotrexate (50 MG/ML) 1 g injection Inject 0.6 mg into the vein every 7 (seven) days.     Multiple Vitamin (MULTIVITAMIN) tablet Take 2 tablets by mouth daily.      mycophenolate (CELLCEPT) 500 MG tablet Take 3 tablets (1,500 mg total) by mouth 2 (two) times daily.     OZEMPIC, 1 MG/DOSE, 4 MG/3ML SOPN INJECT 1 MG  SUBCUTANEOUS EVERY 7 DAYS FOR DIABETES AND WEIGHT CONTROL     phentermine (ADIPEX-P) 37.5 MG tablet Take by mouth.     polyethylene glycol (MIRALAX / GLYCOLAX) packet Take 17 g by mouth daily as needed for moderate constipation (constipation).      RESTASIS 0.05 % ophthalmic emulsion Place 1 drop into both eyes 2 (two) times daily.     sacubitril-valsartan (ENTRESTO) 49-51 MG Take 1 tablet by mouth 2 (two) times daily. 60 tablet 11   Tiotropium Bromide Monohydrate (SPIRIVA RESPIMAT) 1.25 MCG/ACT AERS Inhale 2 puffs into the lungs daily. 4 g 0   tiZANidine (ZANAFLEX) 4 MG tablet Take 4 mg by mouth 3 (three) times daily.     nitroGLYCERIN (NITROSTAT) 0.4 MG SL tablet Place 1 tablet (0.4 mg total) under the tongue every 5 (five) minutes as needed for chest pain. 90 tablet 3   No current facility-administered  medications for this visit.     Review of Systems  Please see the history of present illness.    (+) Low back pain (+) Anxiety  All other systems reviewed and are otherwise negative except as noted above.  Physical Exam    Wt Readings from Last 3 Encounters:  01/31/22 (!) 316 lb 9.6 oz (143.6 kg)  01/20/22 (!) 312 lb (141.5 kg)  11/11/21 (!) 327 lb 12.8 oz (148.7 kg)   VS: Vitals:   01/31/22 1544  BP: 102/64  Pulse: 71  SpO2: 100%  ,Body mass index is 48.14 kg/m.  Constitutional:      Appearance: Healthy appearance. Not in distress.  Neck:     Vascular: JVD normal.  Pulmonary:     Effort: Pulmonary effort is normal.     Breath sounds: No wheezing. No rales. Diminished in the bases Cardiovascular:     Normal rate. Regular rhythm. Normal S1. Normal S2.      Murmurs: There is no murmur.  Edema:    Peripheral edema absent.  Abdominal:     Palpations: Abdomen is soft non tender. There is no hepatomegaly.  Skin:    General: Skin is warm and dry.  Neurological:     General: No focal deficit present.     Mental Status: Alert and oriented to person, place and time.      Cranial Nerves: Cranial nerves are intact.  EKG/LABS/Other Studies Reviewed    ECG personally reviewed by me today -none completed today  Lab Results  Component Value Date   WBC 11.7 (H) 06/26/2019   HGB 9.1 (L) 06/26/2019   HCT 30.4 (L) 06/26/2019   MCV 87.6 06/26/2019   PLT 231 06/26/2019   Lab Results  Component Value Date   CREATININE 1.08 (H) 06/30/2019   BUN 13 06/30/2019   NA 141 06/30/2019   K 3.5 06/30/2019   CL 106 06/30/2019   CO2 26 06/30/2019   Lab Results  Component Value Date   ALT 22 06/18/2019   AST 38 06/18/2019   ALKPHOS 102 06/18/2019   BILITOT 0.5 06/18/2019   Lab Results  Component Value Date   TRIG 159 (H) 09/30/2012    Lab Results  Component Value Date   HGBA1C 7.0 (H) 05/30/2019    Assessment & Plan    1.  NICM: -EF was reduced in 2019 due to rituximab therapy for her ANCA+ vasculitis.  -2D echo with improved EF of 60% in 03/2018  -Today patient is euvolemic on examination -Continue GDMT with carvedilol, and Entresto -Patient endorses fatigue but denies dyspnea on exertion -We will repeat 2D echo to evaluate fatigue and current heart function since adding GDMT.   2.  Hypertension: -Blood pressure today was well controlled at 102/64 -Continue carvedilol 12.5 mg twice daily   3.  Chronic dyspnea: -Today patient reports no dyspnea on exertion -Patient declines sleep study   4.  Microscopic polyangiitis:  -H/o ANCA+ vasculitis. No longer following with [facility], follows with rheumatology.   5.  DM type II: -Patient has had significant weight loss on Ozempic -Continue current treatment plan per PCP  6.  Coronary atherosclerosis: -Patient completed a high-resolution CT scan 01/2022 that showed coronary calcifications in LAD -She reports having to use nitroglycerin twice over the past 2 months due to increased stress and angina. -We will order cardiac PET stress test to evaluate for possible ischemia in the setting of elevated kidney  function.      Disposition: Follow-up with  Jenkins Rouge, MD or APP in 3 months Shared Decision Making/Informed Consent The risks [chest pain, shortness of breath, cardiac arrhythmias, dizziness, blood pressure fluctuations, myocardial infarction, stroke/transient ischemic attack, nausea, vomiting, allergic reaction, radiation exposure, metallic taste sensation and life-threatening complications (estimated to be 1 in 10,000)], benefits (risk stratification, diagnosing coronary artery disease, treatment guidance) and alternatives of a cardiac PET stress test were discussed in detail with Ms. Righter and she agrees to proceed.   Medication Adjustments/Labs and Tests Ordered: Current medicines are reviewed at length with the patient today.  Concerns regarding medicines are outlined above.   Signed, Mable Fill, Marissa Nestle, NP 01/31/2022, 4:00 PM [facility]  Note:  This document was prepared using Dragon voice recognition software and may include unintentional dictation errors.

## 2022-01-31 ENCOUNTER — Encounter: Payer: Self-pay | Admitting: Nurse Practitioner

## 2022-01-31 ENCOUNTER — Ambulatory Visit: Payer: No Typology Code available for payment source | Attending: Nurse Practitioner | Admitting: Nurse Practitioner

## 2022-01-31 VITALS — BP 102/64 | HR 71 | Ht 68.0 in | Wt 316.6 lb

## 2022-01-31 DIAGNOSIS — I10 Essential (primary) hypertension: Secondary | ICD-10-CM

## 2022-01-31 DIAGNOSIS — M317 Microscopic polyangiitis: Secondary | ICD-10-CM

## 2022-01-31 DIAGNOSIS — I7090 Unspecified atherosclerosis: Secondary | ICD-10-CM

## 2022-01-31 DIAGNOSIS — I428 Other cardiomyopathies: Secondary | ICD-10-CM

## 2022-01-31 DIAGNOSIS — E118 Type 2 diabetes mellitus with unspecified complications: Secondary | ICD-10-CM

## 2022-01-31 DIAGNOSIS — J9611 Chronic respiratory failure with hypoxia: Secondary | ICD-10-CM | POA: Diagnosis not present

## 2022-01-31 NOTE — Patient Instructions (Addendum)
Medication Instructions:  Your physician recommends that you continue on your current medications as directed. Please refer to the Current Medication list given to you today. *If you need a refill on your cardiac medications before your next appointment, please call your pharmacy*   Lab Work: None ordered   Testing/Procedures: CARDIAC PET   Follow-Up: At [facility], you and your health needs are our priority.  As part of our continuing mission to provide you with exceptional heart care, we have created designated Provider Care Teams.  These Care Teams include your primary Cardiologist (physician) and Advanced Practice Providers (APPs -  Physician Assistants and Nurse Practitioners) who all work together to provide you with the care you need, when you need it.  We recommend signing up for the patient portal called "MyChart".  Sign up information is provided on this After Visit Summary.  MyChart is used to connect with patients for Virtual Visits (Telemedicine).  Patients are able to view lab/test results, encounter notes, upcoming appointments, etc.  Non-urgent messages can be sent to your provider as well.   To learn more about what you can do with MyChart, go to [email or web address].    Your next appointment:   3 month(s)  Provider:   Ambrose Pancoast, NP       Other Instructions   How to Prepare for Your Cardiac PET/CT Stress Test:  1. Please do not take these medications before your test:   Medications that may interfere with the cardiac pharmacological stress agent (ex. nitrates - including erectile dysfunction medications, isosorbide mononitrate or beta-blockers) the day of the exam. (Erectile dysfunction medication should be held for at least 72 hrs prior to test) Theophylline containing medications for 12 hours. Dipyridamole 48 hours prior to the test. Your remaining medications may be taken with water.  2. Nothing to eat or drink, except water, 3 hours prior to  arrival time.   NO caffeine/decaffeinated products, or chocolate 12 hours prior to arrival.  3. NO perfume, cologne or lotion  4. Total time is 1 to 2 hours; you may want to bring reading material for the waiting time.  5. Please report to Admitting at the [facility] Main Entrance 30 minutes early for your test.  [place], [place] [address]  Diabetic Preparation:  Hold oral medications. You may take NPH and Lantus insulin. Do not take Humalog or Humulin R (Regular Insulin) the day of your test. Check blood sugars prior to leaving the house. If able to eat breakfast prior to 3 hour fasting, you may take all medications, including your insulin, Do not worry if you miss your breakfast dose of insulin - start at your next meal.  IF YOU THINK YOU MAY BE PREGNANT, OR ARE NURSING PLEASE INFORM THE TECHNOLOGIST.  In preparation for your appointment, medication and supplies will be purchased.  Appointment availability is limited, so if you need to cancel or reschedule, please call the Radiology Department at [phone]  24 hours in advance to avoid a cancellation fee of $100.00  What to Expect After you Arrive:  Once you arrive and check in for your appointment, you will be taken to a preparation room within the Radiology Department.  A technologist or Nurse will obtain your medical history, verify that you are correctly prepped for the exam, and explain the procedure.  Afterwards,  an IV will be started in your arm and electrodes will be placed on your skin for EKG monitoring during the stress  portion of the exam. Then you will be escorted to the PET/CT scanner.  There, staff will get you positioned on the scanner and obtain a blood pressure and EKG.  During the exam, you will continue to be connected to the EKG and blood pressure machines.  A small, safe amount of a radioactive tracer will be injected in your IV to obtain a series of pictures of your heart along with an  injection of a stress agent.    After your Exam:  It is recommended that you eat a meal and drink a caffeinated beverage to counter act any effects of the stress agent.  Drink plenty of fluids for the remainder of the day and urinate frequently for the first couple of hours after the exam.  Your doctor will inform you of your test results within 7-10 business days.  For questions about your test or how to prepare for your test, please call: Marchia Bond, Cardiac Imaging Nurse Navigator  Gordy Clement, Cardiac Imaging Nurse Navigator Office: [phone]

## 2022-03-06 ENCOUNTER — Other Ambulatory Visit (HOSPITAL_BASED_OUTPATIENT_CLINIC_OR_DEPARTMENT_OTHER): Payer: Self-pay | Admitting: Nurse Practitioner

## 2022-03-09 ENCOUNTER — Other Ambulatory Visit: Payer: Self-pay | Admitting: Surgery

## 2022-03-09 DIAGNOSIS — M4726 Other spondylosis with radiculopathy, lumbar region: Secondary | ICD-10-CM

## 2022-04-09 ENCOUNTER — Ambulatory Visit
Admission: RE | Admit: 2022-04-09 | Discharge: 2022-04-09 | Disposition: A | Discharge status: Home / Self Care | Payer: No Typology Code available for payment source | Source: Ambulatory Visit | Attending: Surgery | Admitting: Surgery

## 2022-04-09 ENCOUNTER — Other Ambulatory Visit (HOSPITAL_BASED_OUTPATIENT_CLINIC_OR_DEPARTMENT_OTHER): Payer: Self-pay | Admitting: Cardiovascular Disease

## 2022-04-09 DIAGNOSIS — M4726 Other spondylosis with radiculopathy, lumbar region: Secondary | ICD-10-CM

## 2022-04-28 ENCOUNTER — Telehealth: Payer: Self-pay | Admitting: Cardiovascular Disease

## 2022-04-28 NOTE — Telephone Encounter (Signed)
Patient stated she is unable to afford getting a PET scan and wants a call back to know how long she can postpone getting the test.  Patient noted the order for this test will need to be reinstated.  Patient stated she will continue taking her medication.

## 2022-04-28 NOTE — Telephone Encounter (Signed)
Called patient back about message. Patient would like to postpone her cardiac PET CT for a few months, due to not being about to afford her deductible right now. Patient also complained about left arm pain she had from a fall at the car dealership. Encouraged patient to call her PCP about her left arm and shoulder. Talked to patient for about 30 minutes. Encouraged patient to call our office when she needs order put back in for test. Will forward to Robin Searing NP for further advisement. Informed patient if he has changes we will call her, if not, she will call us when she is ready to have test and orders will be placed at that time.

## 2022-05-01 ENCOUNTER — Ambulatory Visit: Payer: No Typology Code available for payment source | Admitting: Nurse Practitioner

## 2022-05-03 NOTE — Telephone Encounter (Signed)
Noted.    Alden Server made aware!  We will wait for the patient to contact the office when she is ready.

## 2022-12-09 ENCOUNTER — Other Ambulatory Visit (HOSPITAL_BASED_OUTPATIENT_CLINIC_OR_DEPARTMENT_OTHER): Payer: Self-pay | Admitting: Nurse Practitioner

## 2023-01-25 ENCOUNTER — Ambulatory Visit: Payer: 59 | Admitting: Internal Medicine

## 2023-01-25 NOTE — Progress Notes (Deleted)
OV 11/11/2021  Subjective:  Patient ID: [name], female , DOB: 12-Dec-1960 , age 63 y.o. , MRN: [id number] , ADDRESS: [address] [place] [address] PCP Barbie Banner, MD Patient Care Team: Barbie Banner, MD as PCP - General (Family Medicine) Wendall Stade, MD as PCP - Cardiology (Cardiology) Kalman Shan, MD as Consulting Physician (Pulmonary Disease) Casimer Lanius, MD as Referring Physician (Rheumatology) Elpidio Galea, MD as Consulting Physician (Ophthalmology)  This Provider for this visit: Treatment Team:  Attending Provider: Kalman Shan, MD    11/11/2021 -   Chief Complaint  Patient presents with   Follow-up    Pt states since last visit, she is still having problems with her breathing and also states that she stays fatigued.     HPI [name] 63 y.o. -returns for follow-up.  She has a history of ANCA vasculitis with pulmonary hemorrhage in 2015.  After that she was on Rituxan for some years complicated by systolic heart failure but now back to baseline.  I personally not seen in a few years.  She is here today because back in February 2023 her kidney function was normal.  She is being maintained on immunosuppression through rheumatology at [facility] and by Dr. Kathe Mariner and renal is monitoring her.  Apparently in August 2023 her creatinine went up and then she was advised to come here just to be on the safe side for pulmonary.  Her last CT scan of the chest was 3 years ago and clear.  She is not having any hemoptysis.  She does have baseline shortness of breath which people attributing to obesity but she denies this from obesity anymore because she is on Ozempic and has lost weight.  She prefers to get a CT scan of the chest to make sure there is no vasculitis coming back to her lungs.  She is also having some cough and wheezing occasionally.      OV 01/20/2022  Subjective:  Patient ID: [name], female , DOB:  [birth year] , age 74 y.o. , MRN: [id number] , ADDRESS: [address] [place] [address] PCP Barbie Banner, MD Patient Care Team: Barbie Banner, MD as PCP - General (Family Medicine) Wendall Stade, MD as PCP - Cardiology (Cardiology) Kalman Shan, MD as Consulting Physician (Pulmonary Disease) Casimer Lanius, MD as Referring Physician (Rheumatology) Elpidio Galea, MD as Consulting Physician (Ophthalmology)  This Provider for this visit: Treatment Team:  Attending Provider: Kalman Shan, MD    01/20/2022 -   Chief Complaint  Patient presents with   Follow-up    PFT done today. Breathing has been worse since the last visit. She gets winded when she bends over.    Follow-up remote history of ANCA vasculitis Follow-up remote history of smoking Morbid obesity  HPI [name] 63 y.o. -turns for follow-up.  She is here to follow-up on her test results.  She did have high-resolution CT chest that I personally visualized and showed it to her.  I agree with the formal interpretation.  She does not have any ILD.  No cancer no pneumonia.  According to the CT report she has mild emphysema it is not easily apparent in our CT scan.  There is definitely no vasculitis.  She was shocked and impressed by these results.  She was in disbelief.  She was wondering why she was on continued immunosuppression.  I did indicate to her that  the reason she does not have any active vasculitis is because of immunosuppression and she should take this that she up with Dr. Allena Katz her nephrologist.  Review of the records indicate she does have more than 20 pack smoking history.  She might be eligible for American [employer] Society expanded criteria for low-dose lung cancer screening.  I will make a referral although the criteria by the [place] prevention task force is a lot more restrictive.  We talked about the subtle emphysema [DLCO is normal].  Pulmonary function test was compromised by her obesity.  Offered  her Spiriva sample.  She is going to try it.  Told her to continue with weight loss which has been doing a good job.  Noted the diagnose of emphysema is going to be new  CT Chest data  CT Chest High Resolution  Result Date: 01/18/2022 CLINICAL DATA:  Shortness of breath with exertion.  Former smoker. EXAM: CT CHEST WITHOUT CONTRAST TECHNIQUE: Multidetector CT imaging of the chest was performed following the standard protocol without intravenous contrast. High resolution imaging of the lungs, as well as inspiratory and expiratory imaging, was performed. RADIATION DOSE REDUCTION: This exam was performed according to the departmental dose-optimization program which includes automated exposure control, adjustment of the mA and/or kV according to patient size and/or use of iterative reconstruction technique. COMPARISON:  07/24/2018. FINDINGS: Cardiovascular: Left anterior descending coronary artery calcification. Heart size normal. No pericardial effusion. Mediastinum/Nodes: No pathologically enlarged mediastinal or axillary lymph nodes. Hilar regions are difficult to definitively evaluate without IV contrast. Esophagus is grossly unremarkable. Lungs/Pleura: Centrilobular emphysema. Negative for subpleural reticulation, traction bronchiectasis/bronchiolectasis, ground-glass, architectural distortion or honeycombing. Minimal bibasilar subpleural scarring. Lungs are otherwise clear. No pleural fluid. Minimal air trapping. Upper Abdomen: Visualized portions of the liver, adrenal glands, spleen, pancreas, stomach and bowel are grossly unremarkable. Musculoskeletal: Degenerative changes in the spine. No worrisome lytic or sclerotic lesions. IMPRESSION: 1. No evidence of interstitial lung disease. 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 2. Left anterior descending coronary artery calcification. 3.  Emphysema (ICD10-J43.9). Electronically Signed   By: Leanna Battles M.D.   On: 01/18/2022 09:29       OV 01/25/2023  Subjective:  Patient ID: [name], female , DOB: 06-16-1960 , age 63 y.o. , MRN: [id number] , ADDRESS: [address] [place] [address] PCP Barbie Banner, MD Patient Care Team: Barbie Banner, MD as PCP - General (Family Medicine) Wendall Stade, MD as PCP - Cardiology (Cardiology) Kalman Shan, MD as Consulting Physician (Pulmonary Disease) Casimer Lanius, MD as Referring Physician (Rheumatology) Elpidio Galea, MD as Consulting Physician (Ophthalmology)  This Provider for this visit: Treatment Team:  Attending Provider: Kalman Shan, MD    01/25/2023 -  No chief complaint on file.    HPI NAIESHA HARVICK 63 y.o. -    CT Chest data from date: ****  - personally visualized and independently interpreted : *** - my findings are: ***   PFT     Latest Ref Rng & Units 01/20/2022    1:52 PM 01/22/2014    4:04 PM  ILD indicators  FVC-Pre L 2.29  2.12   FVC-Predicted Pre % 60  53   FVC-Post L 2.46  1.91   FVC-Predicted Post % 65  48   TLC L 4.77    TLC Predicted % 84    DLCO uncorrected ml/min/mmHg 23.32  19.07   DLCO UNC %Pred % 101  64   DLCO  Corrected ml/min/mmHg 23.32    DLCO COR %Pred % 101        LAB RESULTS last 96 hours No results found.  LAB RESULTS last 90 days No results found for this or any previous visit (from the past 2160 hours).       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), Diverticulitis of large intestine with perforation (09/23/2012), Dyspnea (05/11/2014), GERD  (gastroesophageal reflux disease), Hemoptysis (06/23/2013), History of endometriosis (1997), tobacco use, presenting hazards to health (09/23/2012), Hyperglycemia (07/24/2013), Hypertension, Hypokalemia (07/20/2013), ILD (interstitial lung disease) (HCC) (01/22/2014), Leukocytosis (05/11/2014), MPA (microscopic polyangiitis) (HCC) (2015), Numbness, Obesity (BMI 30-39.9) (09/23/2012), Paresthesia (12/26/2016), Perforation of sigmoid colon - stercoral (09/27/2012), Physical deconditioning (04/30/2015), Pulmonary alveolar hemorrhage (09/03/2013), Respiratory failure with hypoxia (HCC) (06/23/2013), and Right leg swelling (12/26/2016).   reports that she quit smoking about 22 years ago. Her smoking use included cigarettes. She started smoking about 49 years ago. She has a 27 pack-year smoking history. She has never used smokeless tobacco.  Past Surgical History:  Procedure Laterality Date   APPENDECTOMY     BLADDER REPAIR     DILATATION & CURETTAGE/HYSTEROSCOPY WITH MYOSURE N/A 06/03/2019   Procedure: DILATATION & CURETTAGE/HYSTEROSCOPY WITH MYOSURE;  Surgeon: Harold Hedge, MD;  Location: [facility] OR;  Service: Gynecology;  Laterality: N/A;   IR CATHETER TUBE CHANGE  06/24/2019   IR FLUORO GUIDE CV LINE RIGHT  06/16/2019   IR RADIOLOGIST EVAL & MGMT  07/29/2019   IR US GUIDE VASC ACCESS RIGHT  06/16/2019   LAPAROSCOPY  1997   dx of endometriosis   LAPAROSCOPY  04/28/2000    Laparoscopy with lysis of adhesions, hysteroscopy, D&C.   TONSILLECTOMY      Allergies  Allergen Reactions   Guaifenesin Nausea And Vomiting and Other (See Comments)    un   Mucinex [Guaifenesin Er] Nausea And Vomiting   Sulfa Antibiotics Nausea And Vomiting    Immunization History  Administered Date(s) Administered   Influenza Inj Mdck Quad Pf 10/26/2021   Influenza Split 10/09/2016, 09/20/2017   Influenza,inj,Quad PF,6+ Mos 11/03/2015, 09/15/2016, 09/26/2018   Influenza,inj,Quad PF,6-35 Mos 09/26/2018   Influenza,inj,quad, With  Preservative 11/03/2015   Influenza-Unspecified 09/20/2017, 10/04/2020   PFIZER Comirnaty(Gray Top)Covid-19 Tri-Sucrose Vaccine 03/22/2019, 04/10/2019   PFIZER(Purple Top)SARS-COV-2 Vaccination 03/27/2019, 04/17/2019   Pneumococcal Conjugate-13 11/02/2017   Pneumococcal Polysaccharide-23 10/02/2018   Tdap 03/29/2012    Family History  Problem Relation Age of Onset   Diabetes Mother        AODM   Diabetes Father        AODM   Hypertension Father    Obesity Father    Breast cancer Paternal Grandmother    Birth defects Cousin    CAD Other        [facility]     Current Outpatient Medications:    ALPRAZolam (XANAX) 1 MG tablet, Take 1 tablet (1 mg total) by mouth 3 (three) times daily as needed for anxiety., Disp: 5 tablet, Rfl: 0   buPROPion (WELLBUTRIN XL) 150 MG 24 hr tablet, Take 450 mg by mouth daily., Disp: , Rfl:    carvedilol (COREG) 12.5 MG tablet, TAKE 1 TABLET BY MOUTH TWICE A DAY, Disp: 60 tablet, Rfl:  0   citalopram (CELEXA) 40 MG tablet, Take 40 mg by mouth daily., Disp: , Rfl:    esomeprazole (NEXIUM) 20 MG capsule, 2 capsules, Disp: , Rfl:    folic acid (FOLVITE) 1 MG tablet, Take 1 mg by mouth daily. , Disp: , Rfl:    gabapentin (NEURONTIN) 600 MG tablet, Take 600 mg by mouth 2 (two) times daily., Disp: , Rfl:    methotrexate (50 MG/ML) 1 g injection, Inject 0.6 mg into the vein every 7 (seven) days., Disp: , Rfl:    Multiple Vitamin (MULTIVITAMIN) tablet, Take 2 tablets by mouth daily. , Disp: , Rfl:    mycophenolate (CELLCEPT) 500 MG tablet, Take 3 tablets (1,500 mg total) by mouth 2 (two) times daily., Disp: , Rfl:    nitroGLYCERIN (NITROSTAT) 0.4 MG SL tablet, Place 1 tablet (0.4 mg total) under the tongue every 5 (five) minutes as needed for chest pain., Disp: 90 tablet, Rfl: 3   OZEMPIC, 1 MG/DOSE, 4 MG/3ML SOPN, INJECT 1 MG SUBCUTANEOUS EVERY 7 DAYS FOR DIABETES AND WEIGHT CONTROL, Disp: , Rfl:    phentermine (ADIPEX-P) 37.5 MG tablet, Take by mouth., Disp: , Rfl:     polyethylene glycol (MIRALAX / GLYCOLAX) packet, Take 17 g by mouth daily as needed for moderate constipation (constipation). , Disp: , Rfl:    RESTASIS 0.05 % ophthalmic emulsion, Place 1 drop into both eyes 2 (two) times daily., Disp: , Rfl:    sacubitril-valsartan (ENTRESTO) 49-51 MG, TAKE 1 TABLET BY MOUTH TWICE A DAY, Disp: 180 tablet, Rfl: 3   Tiotropium Bromide Monohydrate (SPIRIVA RESPIMAT) 1.25 MCG/ACT AERS, Inhale 2 puffs into the lungs daily., Disp: 4 g, Rfl: 0   tiZANidine (ZANAFLEX) 4 MG tablet, Take 4 mg by mouth 3 (three) times daily., Disp: , Rfl:       Objective:   There were no vitals filed for this visit.  Estimated body mass index is 48.14 kg/m as calculated from the following:   Height as of 01/31/22: 5\' 8"  (1.727 m).   Weight as of 01/31/22: 316 lb 9.6 oz (143.6 kg).  @WEIGHTCHANGE @  There were no vitals filed for this visit.   Physical Exam   General: No distress. *** O2 at rest: *** Cane present: *** Sitting in wheel chair: *** Frail: *** Obese: *** Neuro: Alert and Oriented x 3. GCS 15. Speech normal Psych: Pleasant Resp:  Barrel Chest - ***.  Wheeze - ***, Crackles - ***, No overt respiratory distress CVS: Normal heart sounds. Murmurs - *** Ext: Stigmata of Connective Tissue Disease - *** HEENT: Normal upper airway. PEERL +. No post nasal drip        Assessment:     No diagnosis found.     Plan:     Patient Instructions  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HCC)  -No evidence of vasculitis in the lungs and CT scan of the chest January 2024  Plan - Vasculitis management according to renal  Pulmonary emphysema, unspecified emphysema type (HCC) Stopped smoking with greater than 25 pack year history   -Very mild pulmonary emphysema reported on high-resolution CT chest January 2024  Plan - Try sample Spiriva Respimat 2  puffs once daily; if this is helping you please call and we will send in the prescription -Referral lung cancer screening program [he might be eligible    Coronary artery calcification seen on CAT scan -normal cardiac  stress test in 2020  Plan - According to primary care physician  Follow-up -1 year or sooner if needed    FOLLOWUP No follow-ups on file.    SIGNATURE    Dr. Kalman Shan, M.D., F.C.C.P,  Pulmonary and Critical Care Medicine Staff Physician, [facility] Center Director - Interstitial Lung Disease  Program  Pulmonary Fibrosis [facility] at [facility] [place], [place], [address]  Pager: [phone], If no answer or between  15:00h - 7:00h: call [phone] Telephone: [phone]  9:14 AM 01/25/2023   Moderate Complexity MDM OFFICE  2021 E/M guidelines, first released in 2021, with minor revisions added in 2023 and 2024 Must meet the requirements for 2 out of 3 dimensions to qualify.    Number and complexity of problems addressed Amount and/or complexity of data reviewed Risk of complications and/or morbidity  One or more chronic illness with mild exacerbation, OR progression, OR  side effects of treatment  Two or more stable chronic illnesses  One undiagnosed new problem with uncertain prognosis  One acute illness with systemic symptoms   One Acute complicated injury Must meet the requirements for 1 of 3 of the categories)  Category 1: Tests and documents, historian  Any combination of 3 of the following:  Assessment requiring an independent historian  Review of prior external note(s) from each unique source  Review of results of each unique test  Ordering of each unique test    Category 2: Interpretation of tests   Independent interpretation of a test performed by another physician/other qualified health care professional (not separately reported)  Category 3: Discuss  management/tests  Discussion of management or test interpretation with external physician/other qualified health care professional/appropriate source (not separately reported) Moderate risk of morbidity from additional diagnostic testing or treatment Examples only:  Prescription drug management  Decision regarding minor surgery with identfied patient or procedure risk factors  Decision regarding elective major surgery without identified patient or procedure risk factors  Diagnosis or treatment significantly limited by social determinants of health             HIGh Complexity  OFFICE   2021 E/M guidelines, first released in 2021, with minor revisions added in 2023. Must meet the requirements for 2 out of 3 dimensions to qualify.    Number and complexity of problems addressed Amount and/or complexity of data reviewed Risk of complications and/or morbidity  Severe exacerbation of chronic illness  Acute or chronic illnesses that may pose a threat to life or bodily function, e.g., multiple trauma, acute MI, pulmonary embolus, severe respiratory distress, progressive rheumatoid arthritis, psychiatric illness with potential threat to self or others, peritonitis, acute renal failure, abrupt change in neurological status Must meet the requirements for 2 of 3 of the categories)  Category 1: Tests and documents, historian  Any combination of 3 of the following:  Assessment requiring an independent historian  Review of prior external note(s) from each unique source  Review of results of each unique test  Ordering of each unique test    Category 2: Interpretation of tests    Independent interpretation of a test performed by another physician/other qualified health care professional (not separately reported)  Category 3: Discuss management/tests  Discussion of management or test interpretation with external physician/other qualified health care professional/appropriate source  (not separately reported)  HIGH risk of morbidity from additional diagnostic testing or treatment Examples only:  Drug therapy requiring intensive monitoring for  toxicity  Decision for elective major surgery with identified pateint or procedure risk factors  Decision regarding hospitalization or escalation of level of care  Decision for DNR or to de-escalate care   Parenteral controlled  substances            LEGEND - Independent interpretation involves the interpretation of a test for which there is a CPT code, and an interpretation or report is customary. When a review and interpretation of a test is performed and documented by the provider, but not separately reported (billed), then this would represent an independent interpretation. This report does not need to conform to the usual standards of a complete report of the test. This does not include interpretation of tests that do not have formal reports such as a complete blood count with differential and blood cultures. Examples would include reviewing a chest radiograph and documenting in the medical record an interpretation, but not separately reporting (billing) the interpretation of the chest radiograph.   An appropriate source includes professionals who are not health care professionals but may be involved in the management of the patient, such as a Clinical research associate, upper officer, case manager or teacher, and does not include discussion with family or informal caregivers.    - SDOH: SDOH are the conditions in the environments where people are born, live, learn, work, play, worship, and age that affect a wide range of health, functioning, and quality-of-life outcomes and risks. (e.g., housing, food insecurity, transportation, etc.). SDOH-related Z codes ranging from Z55-Z65 are the ICD-10-CM diagnosis codes used to document SDOH data Z55 - Problems related to education and literacy Z56 - Problems related to employment  and unemployment Z57 - Occupational exposure to risk factors Z58 - Problems related to physical environment Z59 - Problems related to housing and economic circumstances [phone] - Problems related to social environment [phone] - Problems related to upbringing [phone] - Other problems related to primary support group, including family circumstances Z35 - Problems related to certain psychosocial circumstances Z65 - Problems related to other psychosocial circumstances

## 2023-03-12 ENCOUNTER — Ambulatory Visit: Payer: 59 | Admitting: Internal Medicine

## 2023-03-12 ENCOUNTER — Encounter: Payer: Self-pay | Admitting: Internal Medicine

## 2023-03-12 VITALS — BP 118/83 | HR 77 | Temp 97.6°F | Ht 69.0 in | Wt 270.0 lb

## 2023-03-12 DIAGNOSIS — I7782 Antineutrophilic cytoplasmic antibody (ANCA) vasculitis: Secondary | ICD-10-CM

## 2023-03-12 DIAGNOSIS — J439 Emphysema, unspecified: Secondary | ICD-10-CM | POA: Diagnosis not present

## 2023-03-12 DIAGNOSIS — Z87891 Personal history of nicotine dependence: Secondary | ICD-10-CM | POA: Diagnosis not present

## 2023-03-12 DIAGNOSIS — R0600 Dyspnea, unspecified: Secondary | ICD-10-CM

## 2023-03-12 MED ORDER — SPIRIVA RESPIMAT 1.25 MCG/ACT IN AERS: 2.0000 | INHALATION_SPRAY | Freq: Every day | RESPIRATORY_TRACT | 3 refills | Status: DC

## 2023-03-12 NOTE — Patient Instructions (Addendum)
 Dyspnea, unspecified type  =-Very likely due to weight related but odd it has not improved despite weight loss  Plan - Continue ongoing weight loss [congratulations] - Do HRCT supine and prone  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HCC) Cellcept  -No evidence of vasculitis in the lungs and CT scan of the chest January 2024  Plan - Vasculitis management according to renal  Pulmonary emphysema, unspecified emphysema type (HCC) Stopped smoking with greater than 25 pack year history   -Very mild pulmonary emphysema reported on high-resolution CT chest January 2024 - spiriva helping and I am glad about that  Plan -Continue Spiriva Respimat 2 puffs once daily;   Coronary artery calcification seen on CAT scan -normal cardiac stress test in 2020  Plan - According to primary care physician or your cardiolopgist  Follow-up -15 min visti with APP after HRCT in next 2 months

## 2023-03-12 NOTE — Progress Notes (Signed)
 OV 11/11/2021  Subjective:  Patient ID: Laurence Spates, female , DOB: [birth year] , age 63 y.o. , MRN: [id number] , ADDRESS: [address] [place] [address] PCP Barbie Banner, MD Patient Care Team: Barbie Banner, MD as PCP - General (Family Medicine) Wendall Stade, MD as PCP - Cardiology (Cardiology) Kalman Shan, MD as Consulting Physician (Pulmonary Disease) Casimer Lanius, MD as Referring Physician (Rheumatology) Elpidio Galea, MD as Consulting Physician (Ophthalmology)  This Provider for this visit: Treatment Team:  Attending Provider: Kalman Shan, MD    11/11/2021 -   Chief Complaint  Patient presents with   Follow-up    Pt states since last visit, she is still having problems with her breathing and also states that she stays fatigued.     HPI GILIANA VANTIL 63 y.o. -returns for follow-up.  She has a history of ANCA vasculitis with pulmonary hemorrhage in 2015.  After that she was on Rituxan for some years complicated by systolic heart failure but now back to baseline.  I personally not seen in a few years.  She is here today because back in February 2023 her kidney function was normal.  She is being maintained on immunosuppression through rheumatology at [facility] and by Dr. Kathe Mariner and renal is monitoring her.  Apparently in August 2023 her creatinine went up and then she was advised to come here just to be on the safe side for pulmonary.  Her last CT scan of the chest was 3 years ago and clear.  She is not having any hemoptysis.  She does have baseline shortness of breath which people attributing to obesity but she denies this from obesity anymore because she is on Ozempic and has lost weight.  She prefers to get a CT scan of the chest to make sure there is no vasculitis coming back to her lungs.  She is also having some cough and wheezing occasionally.      OV 01/20/2022  Subjective:  Patient ID: Laurence Spates, female , DOB:  December 05, 1960 , age 63 y.o. , MRN: [id number] , ADDRESS: [address] [place] [address] PCP Barbie Banner, MD Patient Care Team: Barbie Banner, MD as PCP - General (Family Medicine) Wendall Stade, MD as PCP - Cardiology (Cardiology) Kalman Shan, MD as Consulting Physician (Pulmonary Disease) Casimer Lanius, MD as Referring Physician (Rheumatology) Elpidio Galea, MD as Consulting Physician (Ophthalmology)  This Provider for this visit: Treatment Team:  Attending Provider: Kalman Shan, MD    01/20/2022 -   Chief Complaint  Patient presents with   Follow-up    PFT done today. Breathing has been worse since the last visit. She gets winded when she bends over.     HPI DALIAH CHAUDOIN 63 y.o. -turns for follow-up.  She is here to follow-up on her test results.  She did have high-resolution CT chest that I personally visualized and showed it to her.  I agree with the formal interpretation.  She does not have any ILD.  No cancer no pneumonia.  According to the CT report she has mild emphysema it is not easily apparent in our CT scan.  There is definitely no vasculitis.  She was shocked and impressed by these results.  She was in disbelief.  She was wondering why she was on continued immunosuppression.  I did indicate to her that the reason she does not have any active vasculitis is because of immunosuppression and she  should take this that she up with Dr. Allena Katz her nephrologist.  Review of the records indicate she does have more than 20 pack smoking history.  She might be eligible for American [employer] Society expanded criteria for low-dose lung cancer screening.  I will make a referral although the criteria by the [place] prevention task force is a lot more restrictive.  We talked about the subtle emphysema [DLCO is normal].  Pulmonary function test was compromised by her obesity.  Offered her Spiriva sample.  She is going to try it.  Told her to continue with weight loss which has been  doing a good job.  Noted the diagnose of emphysema is going to be new  CT Chest data  CT Chest High Resolution  Result Date: 01/18/2022 CLINICAL DATA:  Shortness of breath with exertion.  Former smoker. EXAM: CT CHEST WITHOUT CONTRAST TECHNIQUE: Multidetector CT imaging of the chest was performed following the standard protocol without intravenous contrast. High resolution imaging of the lungs, as well as inspiratory and expiratory imaging, was performed. RADIATION DOSE REDUCTION: This exam was performed according to the departmental dose-optimization program which includes automated exposure control, adjustment of the mA and/or kV according to patient size and/or use of iterative reconstruction technique. COMPARISON:  07/24/2018. FINDINGS: Cardiovascular: Left anterior descending coronary artery calcification. Heart size normal. No pericardial effusion. Mediastinum/Nodes: No pathologically enlarged mediastinal or axillary lymph nodes. Hilar regions are difficult to definitively evaluate without IV contrast. Esophagus is grossly unremarkable. Lungs/Pleura: Centrilobular emphysema. Negative for subpleural reticulation, traction bronchiectasis/bronchiolectasis, ground-glass, architectural distortion or honeycombing. Minimal bibasilar subpleural scarring. Lungs are otherwise clear. No pleural fluid. Minimal air trapping. Upper Abdomen: Visualized portions of the liver, adrenal glands, spleen, pancreas, stomach and bowel are grossly unremarkable. Musculoskeletal: Degenerative changes in the spine. No worrisome lytic or sclerotic lesions. IMPRESSION: 1. No evidence of interstitial lung disease. 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 2. Left anterior descending coronary artery calcification. 3.  Emphysema (ICD10-J43.9). Electronically Signed   By: Leanna Battles M.D.   On: 01/18/2022 09:29      OV 03/12/2023  Subjective:  Patient ID: Laurence Spates, female , DOB: 1960-08-16 , age 63  y.o. , MRN: [id number] , ADDRESS: [address] [place] [address] PCP Barbie Banner, MD Patient Care Team: Barbie Banner, MD as PCP - General (Family Medicine) Wendall Stade, MD as PCP - Cardiology (Cardiology) Kalman Shan, MD as Consulting Physician (Pulmonary Disease) Casimer Lanius, MD as Referring Physician (Rheumatology) Elpidio Galea, MD as Consulting Physician (Ophthalmology)  This Provider for this visit: Treatment Team:  Attending Provider: Kalman Shan, MD    03/12/2023 -   Chief Complaint  Patient presents with   Follow-up   Follow-up remote history of ANCA vasculitis Follow-up remote history of smoking Morbid obesity  HPI JAMIYLA ISHEE 63 y.o. -presents for 1 year follow-up.  She is wondering why her CT scan of the chest was not scheduled but I did indicate to her that last year the CT scan of the chest was clear and free of vasculitis.  She says there was some white spots in it.  Although there was is air trapping otherwise clear lung fields.  She believes it is in her best interest to have another CT scan of the chest.  She continues on CellCept.  She also take Spiriva which I gave it to her because of CT scan base of emphysema which is helping her shortness of breath.  Otherwise  she says she has lost over 100 pounds of weight but despite this has significant shortness of breath on exertion [despite the weight loss she continues to be obese] she has severe depression although she is not suicidal.  She has high levels of stress she has neuropathy.  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  Teeth are breaking off.  She feels cold all the time.  There are some any other constitutional symptoms.  We resolved that we would get a CT scan of the chest.  She denies any chest pain. PFT     Latest Ref Rng & Units 01/20/2022    1:52 PM 01/22/2014    4:04 PM  PFT Results  FVC-Pre L 2.29  2.12   FVC-Predicted Pre % 60  53   FVC-Post L 2.46  1.91   FVC-Predicted Post  % 65  48   Pre FEV1/FVC % % 84  86   Post FEV1/FCV % % 78  91   FEV1-Pre L 1.92  1.82   FEV1-Predicted Pre % 65  58   FEV1-Post L 1.93  1.73   DLCO uncorrected ml/min/mmHg 23.32  19.07   DLCO UNC% % 101  64   DLCO corrected ml/min/mmHg 23.32    DLCO COR %Predicted % 101    DLVA Predicted % 139  96   TLC L 4.77    TLC % Predicted % 84    RV % Predicted % 76         LAB RESULTS last 96 hours No results found.       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), Diverticulitis of large intestine with perforation (09/23/2012), Dyspnea (05/11/2014), GERD (gastroesophageal reflux disease), Hemoptysis (06/23/2013), History of endometriosis (1997), tobacco use, presenting hazards to health (09/23/2012), Hyperglycemia (07/24/2013), Hypertension, Hypokalemia (07/20/2013), ILD (interstitial lung disease) (HCC) (01/22/2014), Leukocytosis (05/11/2014), MPA (microscopic polyangiitis) (HCC) (2015), Numbness, Obesity (BMI 30-39.9) (09/23/2012), Paresthesia (12/26/2016), Perforation of sigmoid colon - stercoral (09/27/2012), Physical deconditioning (04/30/2015), Pulmonary alveolar hemorrhage (09/03/2013), Respiratory failure with hypoxia (HCC) (06/23/2013), and Right leg swelling (12/26/2016).   reports that she quit smoking about 22 years ago. Her smoking use included cigarettes. She started smoking about 49 years ago. She has a 27 pack-year smoking history. She has never used smokeless tobacco.  Past Surgical History:  Procedure Laterality Date   APPENDECTOMY     BLADDER  REPAIR     DILATATION & CURETTAGE/HYSTEROSCOPY WITH MYOSURE N/A 06/03/2019   Procedure: DILATATION & CURETTAGE/HYSTEROSCOPY WITH MYOSURE;  Surgeon: Harold Hedge, MD;  Location: [facility] OR;  Service: Gynecology;  Laterality: N/A;   IR CATHETER TUBE CHANGE  06/24/2019   IR FLUORO GUIDE CV LINE RIGHT  06/16/2019   IR RADIOLOGIST EVAL & MGMT  07/29/2019   IR US GUIDE VASC ACCESS RIGHT  06/16/2019   LAPAROSCOPY  1997   dx of endometriosis   LAPAROSCOPY  04/28/2000    Laparoscopy with lysis of adhesions, hysteroscopy, D&C.   TONSILLECTOMY      Allergies  Allergen Reactions   Guaifenesin Nausea And Vomiting and Other (See Comments)    un   Mucinex [Guaifenesin Er] Nausea And Vomiting   Sulfa Antibiotics Nausea And Vomiting  Immunization History  Administered Date(s) Administered   Influenza Inj Mdck Quad Pf 10/26/2021   Influenza Split 10/09/2016, 09/20/2017   Influenza,inj,Quad PF,6+ Mos 11/03/2015, 09/15/2016, 09/26/2018   Influenza,inj,Quad PF,6-35 Mos 09/26/2018   Influenza,inj,quad, With Preservative 11/03/2015   Influenza-Unspecified 09/20/2017, 10/04/2020   PFIZER Comirnaty(Gray Top)Covid-19 Tri-Sucrose Vaccine 03/22/2019, 04/10/2019   PFIZER(Purple Top)SARS-COV-2 Vaccination 03/27/2019, 04/17/2019   Pneumococcal Conjugate-13 11/02/2017   Pneumococcal Polysaccharide-23 10/02/2018   Tdap 03/29/2012    Family History  Problem Relation Age of Onset   Diabetes Mother        AODM   Diabetes Father        AODM   Hypertension Father    Obesity Father    Breast cancer Paternal Grandmother    Birth defects Cousin    CAD Other        [facility]     Current Outpatient Medications:    ALPRAZolam (XANAX) 1 MG tablet, Take 1 tablet (1 mg total) by mouth 3 (three) times daily as needed for anxiety., Disp: 5 tablet, Rfl: 0   buPROPion (WELLBUTRIN XL) 150 MG 24 hr tablet, Take 450 mg by mouth., Disp: , Rfl:    carvedilol (COREG) 12.5 MG tablet, TAKE 1 TABLET BY MOUTH TWICE A DAY, Disp: 60  tablet, Rfl: 0   citalopram (CELEXA) 40 MG tablet, Take 40 mg by mouth daily., Disp: , Rfl:    esomeprazole (NEXIUM) 20 MG capsule, 2 capsules, Disp: , Rfl:    folic acid (FOLVITE) 1 MG tablet, Take 1 mg by mouth daily. , Disp: , Rfl:    methotrexate (50 MG/ML) 1 g injection, Inject 0.6 mg into the vein every 7 (seven) days., Disp: , Rfl:    mycophenolate (CELLCEPT) 500 MG tablet, Take 3 tablets (1,500 mg total) by mouth 2 (two) times daily., Disp: , Rfl:    OZEMPIC, 1 MG/DOSE, 4 MG/3ML SOPN, 2 mg., Disp: , Rfl:    polyethylene glycol (MIRALAX / GLYCOLAX) packet, Take 17 g by mouth daily as needed for moderate constipation (constipation). , Disp: , Rfl:    pregabalin (LYRICA) 100 MG capsule, Take 100 mg by mouth 2 (two) times daily., Disp: , Rfl:    RESTASIS 0.05 % ophthalmic emulsion, Place 1 drop into both eyes 2 (two) times daily., Disp: , Rfl:    sacubitril-valsartan (ENTRESTO) 49-51 MG, TAKE 1 TABLET BY MOUTH TWICE A DAY, Disp: 180 tablet, Rfl: 3   Tiotropium Bromide Monohydrate (SPIRIVA RESPIMAT) 1.25 MCG/ACT AERS, Inhale 2 puffs into the lungs daily., Disp: 4 g, Rfl: 0   gabapentin (NEURONTIN) 600 MG tablet, Take 600 mg by mouth 2 (two) times daily. (Patient not taking: Reported on 03/12/2023), Disp: , Rfl:    Multiple Vitamin (MULTIVITAMIN) tablet, Take 2 tablets by mouth daily.  (Patient not taking: Reported on 03/12/2023), Disp: , Rfl:    nitroGLYCERIN (NITROSTAT) 0.4 MG SL tablet, Place 1 tablet (0.4 mg total) under the tongue every 5 (five) minutes as needed for chest pain., Disp: 90 tablet, Rfl: 3   phentermine (ADIPEX-P) 37.5 MG tablet, Take by mouth. (Patient not taking: Reported on 03/12/2023), Disp: , Rfl:    tiZANidine (ZANAFLEX) 4 MG tablet, Take 4 mg by mouth 3 (three) times daily. (Patient not taking: Reported on 03/12/2023), Disp: , Rfl:       Objective:   Vitals:   03/12/23 1330  BP: 118/83  Pulse: 77  Temp: 97.6 F (36.4 C)  TempSrc: Oral  SpO2: 97%  Weight: 270 lb  (122.5 kg)  Height: 5\' 9"  (1.753 m)    Estimated body mass index is 39.87 kg/m as calculated from the following:   Height as of this encounter: 5\' 9"  (1.753 m).   Weight as of this encounter: 270 lb (122.5 kg).  @WEIGHTCHANGE @  [employer]   03/12/23 1330  Weight: 270 lb (122.5 kg)     Physical Exam   General: No distress. Obese O2 at rest: no Cane present: no Sitting in wheel chair: no Frail: no Obese: YES Neuro: Alert and Oriented x 3. GCS 15. Speech normal Psych: Pleasant Resp:  Barrel Chest - no.  Wheeze - no, Crackles - no, No overt respiratory distress CVS: Normal heart sounds. Murmurs - no Ext: Stigmata of Connective Tissue Disease - no HEENT: Normal upper airway. PEERL +. No post nasal drip        Assessment:       ICD-10-CM   1. Dyspnea, unspecified type  R06.00 CT Chest High Resolution    2. 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HCC)  I77.82 CT Chest High Resolution    3. Pulmonary emphysema, unspecified emphysema type (HCC)  J43.9 CT Chest High Resolution    4. Stopped smoking with greater than 25 pack year history  Z87.891 CT Chest High Resolution         Plan:     Patient Instructions  Dyspnea, unspecified type  =-Very likely due to weight related but odd it has not improved despite weight loss  Plan - Continue ongoing weight loss [congratulations] - Do HRCT supine and prone  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HCC)  -No evidence of vasculitis in the lungs and CT scan of the chest January 2024  Plan - Vasculitis management according to renal  Pulmonary emphysema, unspecified emphysema type (HCC) Stopped smoking with greater than 25 pack year history   -Very mild pulmonary emphysema reported on high-resolution CT chest January 2024 - spiriva helping and I am glad about that  Plan -Continue Spiriva Respimat 2 puffs once daily;   Coronary artery calcification seen on CAT scan -normal cardiac stress test in 2020  Plan - According to primary  care physician or your cardiolopgist  Follow-up -15 min visti with APP after HRCT in next 2 months    FOLLOWUP Return in about 8 weeks (around 05/07/2023) for with any of the APPS, Face to Face OR Video Visit.    SIGNATURE    Dr. Kalman Shan, M.D., F.C.C.P,  Pulmonary and Critical Care Medicine Staff Physician, [facility] Director - Interstitial Lung Disease  Program  Pulmonary Fibrosis [facility] at [facility] [place], [place], [address]  Pager: [phone], If no answer or between  15:00h - 7:00h: call [phone] Telephone: [phone]  1:57 PM 03/12/2023

## 2023-04-04 ENCOUNTER — Other Ambulatory Visit (HOSPITAL_BASED_OUTPATIENT_CLINIC_OR_DEPARTMENT_OTHER): Payer: Self-pay | Admitting: Nurse Practitioner

## 2023-04-05 ENCOUNTER — Other Ambulatory Visit (HOSPITAL_BASED_OUTPATIENT_CLINIC_OR_DEPARTMENT_OTHER): Payer: Self-pay | Admitting: Nurse Practitioner

## 2023-04-09 ENCOUNTER — Other Ambulatory Visit: Payer: Self-pay

## 2023-04-09 ENCOUNTER — Telehealth: Payer: Self-pay | Admitting: Nurse Practitioner

## 2023-04-09 MED ORDER — ENTRESTO 49-51 MG PO TABS: 1.0000 | ORAL_TABLET | Freq: Two times a day (BID) | ORAL | 0 refills | Status: DC

## 2023-04-09 MED ORDER — CARVEDILOL 12.5 MG PO TABS: 12.5000 mg | ORAL_TABLET | Freq: Two times a day (BID) | ORAL | 0 refills | Status: DC

## 2023-04-09 MED ORDER — ENTRESTO 49-51 MG PO TABS: 1.0000 | ORAL_TABLET | Freq: Two times a day (BID) | ORAL | 2 refills | Status: DC

## 2023-04-09 NOTE — Telephone Encounter (Signed)
*  STAT* If patient is at the pharmacy, call can be transferred to refill team.   1. Which medications need to be refilled? (please list name of each medication and dose if known) sacubitril-valsartan (ENTRESTO) 49-51 MG ; carvedilol (COREG) 12.5 MG tablet    2. Would you like to learn more about the convenience, safety, & potential cost savings by using the [facility]?      3. Are you open to using the [facility] (Type [facility]. ).   4. Which pharmacy/location (including street and city if local pharmacy) is medication to be sent to? [id number] - [place], [place] - [address]    5. Do they need a 30 day or 90 day supply? 90

## 2023-04-09 NOTE — Telephone Encounter (Signed)
Pt's medications were resent to pt's pharmacy as requested. Confirmation received.  °

## 2023-04-16 ENCOUNTER — Encounter: Payer: Self-pay | Admitting: Internal Medicine

## 2023-04-19 ENCOUNTER — Ambulatory Visit
Admission: RE | Admit: 2023-04-19 | Discharge: 2023-04-19 | Disposition: A | Discharge status: Home / Self Care | Source: Ambulatory Visit | Attending: Internal Medicine | Admitting: Internal Medicine

## 2023-04-19 DIAGNOSIS — J439 Emphysema, unspecified: Secondary | ICD-10-CM

## 2023-04-19 DIAGNOSIS — Z87891 Personal history of nicotine dependence: Secondary | ICD-10-CM

## 2023-04-19 DIAGNOSIS — I7782 Antineutrophilic cytoplasmic antibody (ANCA) vasculitis: Secondary | ICD-10-CM

## 2023-04-19 DIAGNOSIS — R0600 Dyspnea, unspecified: Secondary | ICD-10-CM

## 2023-05-21 NOTE — Progress Notes (Signed)
 This encounter was created in error - please disregard.

## 2023-05-21 NOTE — Patient Instructions (Signed)
 Dyspnea, unspecified type  =-Very likely due to weight related but odd it has not improved despite weight loss  Plan - Continue ongoing weight loss [congratulations] - Do HRCT supine and prone  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HCC) Cellcept  -No evidence of vasculitis in the lungs and CT scan of the chest January 2024  Plan - Vasculitis management according to renal  Pulmonary emphysema, unspecified emphysema type (HCC) Stopped smoking with greater than 25 pack year history   -Very mild pulmonary emphysema reported on high-resolution CT chest January 2024 - spiriva helping and I am glad about that  Plan -Continue Spiriva Respimat 2 puffs once daily;   Coronary artery calcification seen on CAT scan -normal cardiac stress test in 2020  Plan - According to primary care physician or your cardiolopgist  Follow-up -15 min visti with APP after HRCT in next 2 months

## 2023-05-22 ENCOUNTER — Encounter: Admitting: Internal Medicine

## 2023-05-23 ENCOUNTER — Encounter: Payer: Self-pay | Admitting: Internal Medicine

## 2023-06-11 ENCOUNTER — Encounter (HOSPITAL_COMMUNITY)

## 2023-06-12 ENCOUNTER — Encounter (HOSPITAL_COMMUNITY)

## 2023-06-14 ENCOUNTER — Other Ambulatory Visit (HOSPITAL_COMMUNITY): Payer: Self-pay

## 2023-06-15 ENCOUNTER — Encounter (HOSPITAL_COMMUNITY)

## 2023-06-18 ENCOUNTER — Encounter (HOSPITAL_COMMUNITY)

## 2023-06-22 ENCOUNTER — Ambulatory Visit (HOSPITAL_COMMUNITY)
Admission: RE | Admit: 2023-06-22 | Discharge: 2023-06-22 | Disposition: A | Discharge status: Home / Self Care | Source: Ambulatory Visit | Attending: Nephrology | Admitting: Nephrology

## 2023-06-22 DIAGNOSIS — D509 Iron deficiency anemia, unspecified: Secondary | ICD-10-CM | POA: Diagnosis present

## 2023-06-22 MED ORDER — SODIUM CHLORIDE 0.9 % IV SOLN
510.0000 mg | INTRAVENOUS | Status: DC
  Administered 2023-06-22: 510 mg via INTRAVENOUS
  Filled 2023-06-22: qty 510

## 2023-06-27 NOTE — Progress Notes (Unsigned)
 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   Diverticulitis of large intestine with perforation 09/23/2012   Dyspnea 05/11/2014   GERD (gastroesophageal reflux disease)    Hemoptysis 06/23/2013   History of endometriosis 1997   Hx of tobacco use, presenting hazards to health 09/23/2012   Hyperglycemia 07/24/2013   Hypertension    Hypokalemia 07/20/2013   ILD (interstitial lung disease) (HCC) 01/22/2014   Leukocytosis 05/11/2014   MPA (microscopic polyangiitis) (HCC) 2015   Numbness    Obesity (BMI 30-39.9) 09/23/2012   Paresthesia 12/26/2016   Perforation of sigmoid colon - stercoral 09/27/2012   Physical deconditioning 04/30/2015   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   Respiratory failure with hypoxia (HCC) 06/23/2013   Right leg swelling 12/26/2016    History of Present Illness  [name] is a 63 y.o. female with PMH of NICM s/p chemo induced, HTN, morbid obesity, aortic atherosclerosis, DM type II, anxiety, microscopic polyangiitis who presents for annual follow-up.  [name] was last seen on 01/31/2022 for follow-up and reported doing well at that time with occasional episodes of chest pain that required  1-2 nitroglycerin .  Her blood pressures were stable and patient was euvolemic on examination.  She endorsed some fatigue but denied any dyspnea on exertion.  She had a 2D echo completed that showed preserved EF with no significant changes and advised to continue current treatment plan.  She was ordered a cardiac PET CT however had to cancel due to high deductible.  She has no interval ED visits or further complaints of chest pain.  She was seen by her pulmonologist on 05/22/2023 and denied any chest pain at that time.  She had a high-resolution CT of the chest completed that showed coronary calcifications and aortic atherosclerosis.  [name] presents today for annual follow-up.  She reports ongoing stress with caring for her mother and has experienced occasional episodes of asymptomatic hypotension.  Her blood pressure today is stable at 102/66.  She has done well since her previous visit and has lost a significant amount of weight.  She is tolerating her medications and Ozempic without any adverse reactions.  She recently ran out of carvedilol  and Entresto  and were taking both medications once daily.  She reports resolution of chest pain and is able to do various tasks around her home without recurrence.  We discussed the indications for stress testing and patient is aware that ischemic evaluation is not warranted at this time. Patient denies chest pain, palpitations, dyspnea, PND, orthopnea, nausea, vomiting, dizziness, syncope, edema, weight gain, or early satiety.  Discussed the use of AI scribe software for clinical note transcription with the patient, who gave verbal consent to proceed.  History  of Present Illness   Review of Systems  Please see the history of present illness.    All other systems reviewed and are otherwise negative except as noted above.  Physical Exam    Wt Readings from Last 3 Encounters:  03/12/23 270 lb (122.5 kg)  01/31/22 (!) 316 lb 9.6 oz (143.6 kg)  01/20/22 (!) 312  lb (141.5 kg)   ZO:XWRUE were no vitals filed for this visit.,There is no height or weight on file to calculate BMI. GEN: Well nourished, well developed in no acute distress Neck: No JVD; No carotid bruits Pulmonary: Clear to auscultation without rales, wheezing or rhonchi  Cardiovascular: Normal rate. Regular rhythm. Normal S1. Normal S2.   Murmurs: There is no murmur.  ABDOMEN: Soft, non-tender, non-distended EXTREMITIES:  No edema; No deformity   EKG/LABS/ Recent Cardiac Studies   ECG personally reviewed by me today -sinus rhythm with rate of 77 bpm and no acute changes consistent with previous EKG.  Risk Assessment/Calculations:          Lab Results  Component Value Date   WBC 11.7 (H) 06/26/2019   HGB 9.1 (L) 06/26/2019   HCT 30.4 (L) 06/26/2019   MCV 87.6 06/26/2019   PLT 231 06/26/2019   Lab Results  Component Value Date   CREATININE 1.08 (H) 06/30/2019   BUN 13 06/30/2019   NA 141 06/30/2019   K 3.5 06/30/2019   CL 106 06/30/2019   CO2 26 06/30/2019   Lab Results  Component Value Date   TRIG 159 (H) 09/30/2012    Lab Results  Component Value Date   HGBA1C 7.0 (H) 05/30/2019   Assessment & Plan    Assessment and Plan Assessment & Plan     1.NICM: -EF was reduced in 2019 due to rituximab  therapy for her ANCA+ vasculitis.  - Today patient is euvolemic on exam and completed recent 2D echo showing preserved EF of 60 to 65%. - She has experienced occasional bouts of hypotension we will switch carvedilol  to Toprol -XL 25 mg - Continue current GDMT with Entresto  49/51 mg twice daily -Low sodium diet, fluid restriction <2L, and daily weights encouraged. Educated to contact our office for weight gain of 2 lbs overnight or 5 lbs in one week.   2.  Coronary atherosclerosis: -Patient completed a high-resolution CT scan 01/2022 that showed coronary calcifications in LAD - Today patient reports no recurrent chest pain with activity - Patient currently not on  statins and will have fasting lipids and LFTs checked. -Continue as needed Nitrostat  0.4 mg twice daily  3.  Primary hypertension: - Patient's blood pressure today is stable at 102/66 - She has experienced bouts of hypotension and will discontinue Coreg  and begin Toprol -XL 25 mg - Continue Entresto  49/51 mg twice daily  4.  Hyperlipidemia: - Patient's last lipid panel completed we will complete fasting lipids and LFTs with plan to initiate statins due to coronary calcifications on CT scan  5. Microscopic polyangiitis:  -H/o ANCA+ vasculitis. No longer following with [facility], follows with rheumatology.  Disposition: Follow-up with [name] Mediate, MD or APP in 12 months    Signed, Francene Ing, Retha Cast, NP 06/27/2023, 1:31 PM [facility]

## 2023-06-28 ENCOUNTER — Encounter: Payer: Self-pay | Admitting: Nurse Practitioner

## 2023-06-28 ENCOUNTER — Ambulatory Visit: Attending: Nurse Practitioner | Admitting: Nurse Practitioner

## 2023-06-28 VITALS — BP 102/66 | HR 77 | Ht 69.0 in | Wt 258.0 lb

## 2023-06-28 DIAGNOSIS — I7090 Unspecified atherosclerosis: Secondary | ICD-10-CM

## 2023-06-28 DIAGNOSIS — I10 Essential (primary) hypertension: Secondary | ICD-10-CM

## 2023-06-28 DIAGNOSIS — I428 Other cardiomyopathies: Secondary | ICD-10-CM

## 2023-06-28 DIAGNOSIS — I7782 Antineutrophilic cytoplasmic antibody (ANCA) vasculitis: Secondary | ICD-10-CM | POA: Diagnosis not present

## 2023-06-28 DIAGNOSIS — E118 Type 2 diabetes mellitus with unspecified complications: Secondary | ICD-10-CM | POA: Diagnosis not present

## 2023-06-28 DIAGNOSIS — M317 Microscopic polyangiitis: Secondary | ICD-10-CM

## 2023-06-28 MED ORDER — METOPROLOL SUCCINATE ER 25 MG PO TB24: 25.0000 mg | ORAL_TABLET | Freq: Every day | ORAL | 1 refills | Status: DC

## 2023-06-28 NOTE — Patient Instructions (Signed)
 Medication Instructions:  STOP Coreg  START Toprol  25mg  Take 1 tablet once a day *If you need a refill on your cardiac medications before your next appointment, please call your pharmacy*  Lab Work: TODAY-LIPIDS & CMET If you have labs (blood work) drawn today and your tests are completely normal, you will receive your results only by: MyChart Message (if you have MyChart) OR A paper copy in the mail If you have any lab test that is abnormal or we need to change your treatment, we will call you to review the results.  Testing/Procedures: NONE ORDERED  Follow-Up: At [facility], you and your health needs are our priority.  As part of our continuing mission to provide you with exceptional heart care, our providers are all part of one team.  This team includes your primary Cardiologist (physician) and Advanced Practice Providers or APPs (Physician Assistants and Nurse Practitioners) who all work together to provide you with the care you need, when you need it.  Your next appointment:   12 month(s)  Provider:   Janelle Mediate, MD    We recommend signing up for the patient portal called MyChart.  Sign up information is provided on this After Visit Summary.  MyChart is used to connect with patients for Virtual Visits (Telemedicine).  Patients are able to view lab/test results, encounter notes, upcoming appointments, etc.  Non-urgent messages can be sent to your provider as well.   To learn more about what you can do with MyChart, go to [email or web address].   Other Instructions

## 2023-06-29 ENCOUNTER — Encounter (HOSPITAL_COMMUNITY)

## 2023-06-29 ENCOUNTER — Telehealth: Payer: Self-pay | Admitting: Pharmacy Technician

## 2023-06-29 NOTE — Telephone Encounter (Signed)
 Auth Submission: NO AUTH NEEDED Site of care: Site of care: [facility] Payer: [employer] Medication & CPT/J Code(s) submitted: Venofer (Iron Sucrose) [id number] Diagnosis Code:  Route of submission (phone, fax, portal):  Phone # Fax # Auth type: Buy/Bill HB Units/visits requested: 200MG  QWK X 2 DOSES Reference number:  Approval from: 06/29/23 to 10/29/23    Order has been changed to Venofer due to Feraheme is non preferred and has been denied. Patient was scheduled and received x1 dose of Feraheme prior to having the order changed to Venofer.  Burdette Carolin

## 2023-07-09 ENCOUNTER — Telehealth: Payer: Self-pay | Admitting: Cardiovascular Disease

## 2023-07-09 NOTE — Telephone Encounter (Signed)
  Pt c/o medication issue:  1. Name of Medication: metoprolol  succinate (TOPROL  XL) 25 MG 24 hr tablet   2. How are you currently taking this medication (dosage and times per day)?  Take 1 tablet (25 mg total) by mouth daily.   3. Are you having a reaction (difficulty breathing--STAT)? Nausea, vomiting, dizziness, hands and feet got cold, flu like symptoms, kept her awake, made her heart beat fast, short of breath  4. What is your medication issue? Patient states she cannot take this medication. She is asking if she can go back to taking Carvedilol . Please advise.

## 2023-07-09 NOTE — Telephone Encounter (Signed)
 Spoke with pt who states she tried to take Metoprolol  for 4 days but was unable to continue due to side effects listed below.  Pt states she has restarted Carvedilol  and would like to stay on this medication but will need a refill.   Pt advised will forward to [facility] NP for review and further recommendation.  Pt verbalizes understanding and agrees with current plan.

## 2023-07-10 MED ORDER — CARVEDILOL 12.5 MG PO TABS: 12.5000 mg | ORAL_TABLET | Freq: Two times a day (BID) | ORAL | 3 refills | Status: DC

## 2023-07-10 NOTE — Telephone Encounter (Signed)
 Spoke to patient [name] Alberts NP advised to stop Toprol  XL 50 mg and start Coreg  12.5 mg twice a day.Stated she has took Coreg  in past with no problems.

## 2023-07-27 ENCOUNTER — Telehealth: Payer: Self-pay | Admitting: Pharmacy Technician

## 2023-07-27 NOTE — Telephone Encounter (Signed)
 F/u: Patient was scheduled and received treatment before BIV was completed. Feraheme is non preferred and will not be covered. Orders were changed to Venofer  Billing issue: Sent message to [facility] in regards to having bill written off since patient received treatment prior to BIV being completed.  Left v/m with patient. Awaiting a call back.

## 2023-08-23 ENCOUNTER — Other Ambulatory Visit: Payer: Self-pay

## 2023-08-23 MED ORDER — SACUBITRIL-VALSARTAN 49-51 MG PO TABS: 1.0000 | ORAL_TABLET | Freq: Two times a day (BID) | ORAL | 3 refills | Status: DC

## 2023-08-24 ENCOUNTER — Telehealth: Payer: Self-pay | Admitting: Cardiovascular Disease

## 2023-08-24 DIAGNOSIS — E118 Type 2 diabetes mellitus with unspecified complications: Secondary | ICD-10-CM

## 2023-08-24 DIAGNOSIS — I10 Essential (primary) hypertension: Secondary | ICD-10-CM

## 2023-08-24 DIAGNOSIS — I7090 Unspecified atherosclerosis: Secondary | ICD-10-CM

## 2023-08-24 DIAGNOSIS — I428 Other cardiomyopathies: Secondary | ICD-10-CM

## 2023-08-24 DIAGNOSIS — M317 Microscopic polyangiitis: Secondary | ICD-10-CM

## 2023-08-24 DIAGNOSIS — I7782 Antineutrophilic cytoplasmic antibody (ANCA) vasculitis: Secondary | ICD-10-CM

## 2023-08-24 NOTE — Telephone Encounter (Signed)
 Pt c/o medication issue:  1. Name of Medication:  sacubitril -valsartan  (ENTRESTO ) 49-51 MG  2. How are you currently taking this medication (dosage and times per day)?   3. Are you having a reaction (difficulty breathing--STAT)?   4. What is your medication issue?  Patient says [name] will no longer cover Entresto  and she would like to discuss other options. She says the medication works really good for her and she hasn't had any issues.

## 2023-08-24 NOTE — Telephone Encounter (Signed)
 Called patient back about message. Patient stated her insurance is not going to cover Entresto  any more after October. Will send message to our pharmacy for help.

## 2023-08-27 ENCOUNTER — Other Ambulatory Visit (HOSPITAL_COMMUNITY): Payer: Self-pay

## 2023-08-27 NOTE — Telephone Encounter (Signed)
 Called patient back to let her know pharmacy tech's advisement. Patient verbalized understanding.

## 2023-08-27 NOTE — Telephone Encounter (Signed)
 Pharmacy filled Entresto  on 06/28/23 for 90 day supply, plan won't pay claim again till after 09/04/23

## 2023-09-16 ENCOUNTER — Ambulatory Visit: Payer: Self-pay | Admitting: Internal Medicine

## 2023-09-16 NOTE — Telephone Encounter (Signed)
 She does NOT have my chart. Please give her the below results form April 2025. Apologize for delay.      IMPRESSION: 1. No evidence of interstitial lung disease- good news 2. 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 3. 1.7 cm low-attenuation right thyroid nodule. Recommend thyroid ultrasound. (Ref: J Am Coll Radiol. 2015 Feb;12(2): 143-50). - talk tp PCP 4. Age advanced left anterior descending coronary artery calcification.- already seeing cardiology 5.  Aortic atherosclerosis (ICD10-I70.0).- already seeing cardiology 6.  Emphysema (ICD10-J43.9). - use albuterol  as needed but any more questionis  needs to come in and see app     Electronically Signed   By: Newell Eke M.D.   On: 05/04/2023 13:59

## 2023-09-18 ENCOUNTER — Other Ambulatory Visit (HOSPITAL_COMMUNITY): Payer: Self-pay | Admitting: Pharmacy Technician

## 2023-09-18 DIAGNOSIS — D631 Anemia in chronic kidney disease: Secondary | ICD-10-CM | POA: Insufficient documentation

## 2023-09-18 NOTE — Progress Notes (Signed)
 Called the pt and there was no answer- LMTCB

## 2023-09-19 NOTE — Telephone Encounter (Signed)
 Lm x2 for patient.  Letter has been sent to address on file. Nothing further needed.

## 2023-10-07 ENCOUNTER — Encounter (HOSPITAL_COMMUNITY): Payer: Self-pay

## 2023-10-07 ENCOUNTER — Emergency Department (HOSPITAL_COMMUNITY)

## 2023-10-07 ENCOUNTER — Other Ambulatory Visit: Payer: Self-pay

## 2023-10-07 ENCOUNTER — Inpatient Hospital Stay (HOSPITAL_COMMUNITY)
Admission: EM | Admit: 2023-10-07 | Discharge: 2023-10-20 | DRG: 853 | Disposition: A | Discharge status: Rehabilitation Facility | Attending: Pulmonary Disease | Admitting: Pulmonary Disease

## 2023-10-07 ENCOUNTER — Encounter (HOSPITAL_COMMUNITY)
Admission: EM | Disposition: A | Discharge status: Rehabilitation Facility | Payer: Self-pay | Source: Home / Self Care | Attending: Pulmonary Disease

## 2023-10-07 ENCOUNTER — Emergency Department (HOSPITAL_COMMUNITY): Admitting: Anesthesiology

## 2023-10-07 DIAGNOSIS — I959 Hypotension, unspecified: Secondary | ICD-10-CM | POA: Diagnosis not present

## 2023-10-07 DIAGNOSIS — D649 Anemia, unspecified: Secondary | ICD-10-CM | POA: Diagnosis present

## 2023-10-07 DIAGNOSIS — G4700 Insomnia, unspecified: Secondary | ICD-10-CM | POA: Diagnosis not present

## 2023-10-07 DIAGNOSIS — K5720 Diverticulitis of large intestine with perforation and abscess without bleeding: Secondary | ICD-10-CM | POA: Diagnosis present

## 2023-10-07 DIAGNOSIS — J189 Pneumonia, unspecified organism: Secondary | ICD-10-CM | POA: Diagnosis not present

## 2023-10-07 DIAGNOSIS — A419 Sepsis, unspecified organism: Secondary | ICD-10-CM | POA: Diagnosis present

## 2023-10-07 DIAGNOSIS — N179 Acute kidney failure, unspecified: Secondary | ICD-10-CM | POA: Diagnosis present

## 2023-10-07 DIAGNOSIS — D62 Acute posthemorrhagic anemia: Secondary | ICD-10-CM | POA: Diagnosis not present

## 2023-10-07 DIAGNOSIS — L02211 Cutaneous abscess of abdominal wall: Secondary | ICD-10-CM | POA: Diagnosis not present

## 2023-10-07 DIAGNOSIS — T81321D Disruption or dehiscence of closure of internal operation (surgical) wound of abdominal wall muscle or fascia, subsequent encounter: Secondary | ICD-10-CM | POA: Diagnosis not present

## 2023-10-07 DIAGNOSIS — J849 Interstitial pulmonary disease, unspecified: Secondary | ICD-10-CM | POA: Diagnosis present

## 2023-10-07 DIAGNOSIS — K659 Peritonitis, unspecified: Secondary | ICD-10-CM | POA: Diagnosis not present

## 2023-10-07 DIAGNOSIS — K651 Peritoneal abscess: Secondary | ICD-10-CM | POA: Diagnosis present

## 2023-10-07 DIAGNOSIS — T8143XA Infection following a procedure, organ and space surgical site, initial encounter: Secondary | ICD-10-CM

## 2023-10-07 DIAGNOSIS — E785 Hyperlipidemia, unspecified: Secondary | ICD-10-CM | POA: Diagnosis present

## 2023-10-07 DIAGNOSIS — R6521 Severe sepsis with septic shock: Secondary | ICD-10-CM | POA: Diagnosis present

## 2023-10-07 DIAGNOSIS — K219 Gastro-esophageal reflux disease without esophagitis: Secondary | ICD-10-CM | POA: Diagnosis present

## 2023-10-07 DIAGNOSIS — D84821 Immunodeficiency due to drugs: Secondary | ICD-10-CM | POA: Diagnosis present

## 2023-10-07 DIAGNOSIS — R188 Other ascites: Secondary | ICD-10-CM | POA: Diagnosis present

## 2023-10-07 DIAGNOSIS — K631 Perforation of intestine (nontraumatic): Secondary | ICD-10-CM

## 2023-10-07 DIAGNOSIS — R059 Cough, unspecified: Secondary | ICD-10-CM | POA: Diagnosis not present

## 2023-10-07 DIAGNOSIS — F418 Other specified anxiety disorders: Secondary | ICD-10-CM | POA: Diagnosis present

## 2023-10-07 DIAGNOSIS — I10 Essential (primary) hypertension: Secondary | ICD-10-CM

## 2023-10-07 DIAGNOSIS — E119 Type 2 diabetes mellitus without complications: Secondary | ICD-10-CM | POA: Diagnosis present

## 2023-10-07 DIAGNOSIS — Z79631 Long term (current) use of antimetabolite agent: Secondary | ICD-10-CM

## 2023-10-07 DIAGNOSIS — E875 Hyperkalemia: Secondary | ICD-10-CM | POA: Diagnosis present

## 2023-10-07 DIAGNOSIS — I428 Other cardiomyopathies: Secondary | ICD-10-CM | POA: Diagnosis present

## 2023-10-07 DIAGNOSIS — I2510 Atherosclerotic heart disease of native coronary artery without angina pectoris: Secondary | ICD-10-CM | POA: Diagnosis present

## 2023-10-07 DIAGNOSIS — Z833 Family history of diabetes mellitus: Secondary | ICD-10-CM

## 2023-10-07 DIAGNOSIS — R680 Hypothermia, not associated with low environmental temperature: Secondary | ICD-10-CM | POA: Diagnosis present

## 2023-10-07 DIAGNOSIS — Z87891 Personal history of nicotine dependence: Secondary | ICD-10-CM | POA: Diagnosis not present

## 2023-10-07 DIAGNOSIS — Z803 Family history of malignant neoplasm of breast: Secondary | ICD-10-CM

## 2023-10-07 DIAGNOSIS — Z7985 Long-term (current) use of injectable non-insulin antidiabetic drugs: Secondary | ICD-10-CM

## 2023-10-07 DIAGNOSIS — K56609 Unspecified intestinal obstruction, unspecified as to partial versus complete obstruction: Secondary | ICD-10-CM | POA: Diagnosis not present

## 2023-10-07 DIAGNOSIS — Z933 Colostomy status: Secondary | ICD-10-CM | POA: Diagnosis not present

## 2023-10-07 DIAGNOSIS — Z6841 Body Mass Index (BMI) 40.0 and over, adult: Secondary | ICD-10-CM

## 2023-10-07 DIAGNOSIS — G8929 Other chronic pain: Secondary | ICD-10-CM | POA: Diagnosis present

## 2023-10-07 DIAGNOSIS — R5381 Other malaise: Secondary | ICD-10-CM | POA: Diagnosis not present

## 2023-10-07 DIAGNOSIS — K650 Generalized (acute) peritonitis: Secondary | ICD-10-CM | POA: Diagnosis present

## 2023-10-07 DIAGNOSIS — Z79899 Other long term (current) drug therapy: Secondary | ICD-10-CM

## 2023-10-07 DIAGNOSIS — E876 Hypokalemia: Secondary | ICD-10-CM | POA: Diagnosis present

## 2023-10-07 DIAGNOSIS — Z8249 Family history of ischemic heart disease and other diseases of the circulatory system: Secondary | ICD-10-CM

## 2023-10-07 DIAGNOSIS — Z882 Allergy status to sulfonamides status: Secondary | ICD-10-CM

## 2023-10-07 DIAGNOSIS — Z8719 Personal history of other diseases of the digestive system: Secondary | ICD-10-CM

## 2023-10-07 DIAGNOSIS — Z888 Allergy status to other drugs, medicaments and biological substances status: Secondary | ICD-10-CM

## 2023-10-07 DIAGNOSIS — I429 Cardiomyopathy, unspecified: Secondary | ICD-10-CM

## 2023-10-07 DIAGNOSIS — Z794 Long term (current) use of insulin: Secondary | ICD-10-CM | POA: Diagnosis not present

## 2023-10-07 DIAGNOSIS — G8918 Other acute postprocedural pain: Secondary | ICD-10-CM | POA: Diagnosis not present

## 2023-10-07 DIAGNOSIS — I7782 Antineutrophilic cytoplasmic antibody (ANCA) vasculitis: Secondary | ICD-10-CM | POA: Diagnosis present

## 2023-10-07 DIAGNOSIS — Z7951 Long term (current) use of inhaled steroids: Secondary | ICD-10-CM

## 2023-10-07 DIAGNOSIS — K5289 Other specified noninfective gastroenteritis and colitis: Secondary | ICD-10-CM | POA: Diagnosis present

## 2023-10-07 DIAGNOSIS — Z79624 Long term (current) use of inhibitors of nucleotide synthesis: Secondary | ICD-10-CM

## 2023-10-07 DIAGNOSIS — F411 Generalized anxiety disorder: Secondary | ICD-10-CM | POA: Diagnosis not present

## 2023-10-07 DIAGNOSIS — E669 Obesity, unspecified: Secondary | ICD-10-CM | POA: Diagnosis present

## 2023-10-07 DIAGNOSIS — T148XXA Other injury of unspecified body region, initial encounter: Secondary | ICD-10-CM | POA: Diagnosis not present

## 2023-10-07 HISTORY — PX: LAPAROTOMY: SHX154

## 2023-10-07 HISTORY — PX: BOWEL RESECTION: SHX1257

## 2023-10-07 LAB — COMPREHENSIVE METABOLIC PANEL WITH GFR
ALT: 10 U/L (ref 0–44)
ALT: 10 U/L (ref 0–44)
AST: 15 U/L (ref 15–41)
AST: 14 U/L — ABNORMAL LOW (ref 15–41)
Albumin: 2.9 g/dL — ABNORMAL LOW (ref 3.5–5.0)
Albumin: 2.1 g/dL — ABNORMAL LOW (ref 3.5–5.0)
Alkaline Phosphatase: 79 U/L (ref 38–126)
Alkaline Phosphatase: 58 U/L (ref 38–126)
Anion gap: 12 (ref 5–15)
Anion gap: 12 (ref 5–15)
BUN: 18 mg/dL (ref 8–23)
BUN: 18 mg/dL (ref 8–23)
CO2: 22 mmol/L (ref 22–32)
CO2: 18 mmol/L — ABNORMAL LOW (ref 22–32)
Calcium: 8.5 mg/dL — ABNORMAL LOW (ref 8.9–10.3)
Calcium: 7.4 mg/dL — ABNORMAL LOW (ref 8.9–10.3)
Chloride: 102 mmol/L (ref 98–111)
Chloride: 107 mmol/L (ref 98–111)
Creatinine, Ser: 1.44 mg/dL — ABNORMAL HIGH (ref 0.44–1.00)
Creatinine, Ser: 1.37 mg/dL — ABNORMAL HIGH (ref 0.44–1.00)
GFR, Estimated: 41 mL/min — ABNORMAL LOW (ref >60)
GFR, Estimated: 44 mL/min — ABNORMAL LOW (ref >60)
Glucose, Bld: 148 mg/dL — ABNORMAL HIGH (ref 70–99)
Glucose, Bld: 130 mg/dL — ABNORMAL HIGH (ref 70–99)
Potassium: 3.2 mmol/L — ABNORMAL LOW (ref 3.5–5.1)
Potassium: 3.2 mmol/L — ABNORMAL LOW (ref 3.5–5.1)
Sodium: 136 mmol/L (ref 135–145)
Sodium: 137 mmol/L (ref 135–145)
Total Bilirubin: 1.9 mg/dL — ABNORMAL HIGH (ref 0.0–1.2)
Total Bilirubin: 2.5 mg/dL — ABNORMAL HIGH (ref 0.0–1.2)
Total Protein: 6.9 g/dL (ref 6.5–8.1)
Total Protein: 5.4 g/dL — ABNORMAL LOW (ref 6.5–8.1)

## 2023-10-07 LAB — URINALYSIS, W/ REFLEX TO CULTURE (INFECTION SUSPECTED)
Glucose, UA: NEGATIVE mg/dL
Hgb urine dipstick: NEGATIVE
Ketones, ur: 5 mg/dL — AB
Leukocytes,Ua: NEGATIVE
Nitrite: NEGATIVE
Protein, ur: 100 mg/dL — AB
Specific Gravity, Urine: 1.019 (ref 1.005–1.030)
pH: 5 (ref 5.0–8.0)

## 2023-10-07 LAB — CBC WITH DIFFERENTIAL/PLATELET
Abs Immature Granulocytes: 0.05 K/uL (ref 0.00–0.07)
Basophils Absolute: 0 K/uL (ref 0.0–0.1)
Basophils Relative: 0 %
Eosinophils Absolute: 0 K/uL (ref 0.0–0.5)
Eosinophils Relative: 0 %
HCT: 41.2 % (ref 36.0–46.0)
Hemoglobin: 13.5 g/dL (ref 12.0–15.0)
Immature Granulocytes: 0 %
Lymphocytes Relative: 5 %
Lymphs Abs: 0.6 K/uL — ABNORMAL LOW (ref 0.7–4.0)
MCH: 28.4 pg (ref 26.0–34.0)
MCHC: 32.8 g/dL (ref 30.0–36.0)
MCV: 86.7 fL (ref 80.0–100.0)
Monocytes Absolute: 0.5 K/uL (ref 0.1–1.0)
Monocytes Relative: 4 %
Neutro Abs: 11.1 K/uL — ABNORMAL HIGH (ref 1.7–7.7)
Neutrophils Relative %: 91 %
Platelets: 267 K/uL (ref 150–400)
RBC: 4.75 MIL/uL (ref 3.87–5.11)
RDW: 11.9 % (ref 11.5–15.5)
WBC: 12.3 K/uL — ABNORMAL HIGH (ref 4.0–10.5)
nRBC: 0 % (ref 0.0–0.2)

## 2023-10-07 LAB — POCT I-STAT 7, (LYTES, BLD GAS, ICA,H+H)
Acid-base deficit: 6 mmol/L — ABNORMAL HIGH (ref 0.0–2.0)
Bicarbonate: 19.8 mmol/L — ABNORMAL LOW (ref 20.0–28.0)
Calcium, Ion: 1.13 mmol/L — ABNORMAL LOW (ref 1.15–1.40)
HCT: 35 % — ABNORMAL LOW (ref 36.0–46.0)
Hemoglobin: 11.9 g/dL — ABNORMAL LOW (ref 12.0–15.0)
O2 Saturation: 100 %
Potassium: 3.2 mmol/L — ABNORMAL LOW (ref 3.5–5.1)
Sodium: 139 mmol/L (ref 135–145)
TCO2: 21 mmol/L — ABNORMAL LOW (ref 22–32)
pCO2 arterial: 41.4 mmHg (ref 32–48)
pH, Arterial: 7.288 — ABNORMAL LOW (ref 7.35–7.45)
pO2, Arterial: 326 mmHg — ABNORMAL HIGH (ref 83–108)

## 2023-10-07 LAB — CBC
HCT: 39.4 % (ref 36.0–46.0)
Hemoglobin: 13 g/dL (ref 12.0–15.0)
MCH: 28.1 pg (ref 26.0–34.0)
MCHC: 33 g/dL (ref 30.0–36.0)
MCV: 85.1 fL (ref 80.0–100.0)
Platelets: 284 K/uL (ref 150–400)
RBC: 4.63 MIL/uL (ref 3.87–5.11)
RDW: 12.3 % (ref 11.5–15.5)
WBC: 9 K/uL (ref 4.0–10.5)
nRBC: 0 % (ref 0.0–0.2)

## 2023-10-07 LAB — MRSA NEXT GEN BY PCR, NASAL: MRSA by PCR Next Gen: DETECTED — AB

## 2023-10-07 LAB — GLUCOSE, CAPILLARY
Glucose-Capillary: 120 mg/dL — ABNORMAL HIGH (ref 70–99)
Glucose-Capillary: 126 mg/dL — ABNORMAL HIGH (ref 70–99)
Glucose-Capillary: 127 mg/dL — ABNORMAL HIGH (ref 70–99)
Glucose-Capillary: 104 mg/dL — ABNORMAL HIGH (ref 70–99)

## 2023-10-07 LAB — HIV ANTIBODY (ROUTINE TESTING W REFLEX): HIV Screen 4th Generation wRfx: NONREACTIVE

## 2023-10-07 LAB — I-STAT CG4 LACTIC ACID, ED: Lactic Acid, Venous: 1.9 mmol/L (ref 0.5–1.9)

## 2023-10-07 LAB — HEMOGLOBIN A1C
Hgb A1c MFr Bld: 4.6 % — ABNORMAL LOW (ref 4.8–5.6)
Mean Plasma Glucose: 85.32 mg/dL

## 2023-10-07 LAB — MAGNESIUM: Magnesium: 1.2 mg/dL — ABNORMAL LOW (ref 1.7–2.4)

## 2023-10-07 LAB — LIPASE, BLOOD: Lipase: 14 U/L (ref 11–51)

## 2023-10-07 LAB — LACTIC ACID, PLASMA: Lactic Acid, Venous: 1 mmol/L (ref 0.5–1.9)

## 2023-10-07 SURGERY — LAPAROTOMY, EXPLORATORY
Anesthesia: General

## 2023-10-07 MED ORDER — NOREPINEPHRINE 4 MG/250ML-% IV SOLN
0.0000 ug/min | INTRAVENOUS | Status: DC
  Administered 2023-10-07: 2 ug/min via INTRAVENOUS
  Administered 2023-10-08: 8 ug/min via INTRAVENOUS
  Administered 2023-10-08: 7 ug/min via INTRAVENOUS
  Administered 2023-10-08 – 2023-10-10 (×3): 6 ug/min via INTRAVENOUS
  Administered 2023-10-11 – 2023-10-12 (×2): 2 ug/min via INTRAVENOUS
  Filled 2023-10-07 (×4): qty 250
  Filled 2023-10-07: qty 500
  Filled 2023-10-07 (×3): qty 250

## 2023-10-07 MED ORDER — LACTATED RINGERS IV SOLN: INTRAVENOUS | Status: DC | PRN

## 2023-10-07 MED ORDER — SODIUM CHLORIDE 0.9 % IV BOLUS
1000.0000 mL | Freq: Once | INTRAVENOUS | Status: AC
  Administered 2023-10-07: 1000 mL via INTRAVENOUS

## 2023-10-07 MED ORDER — FENTANYL CITRATE (PF) 100 MCG/2ML IJ SOLN: 25.0000 ug | INTRAMUSCULAR | Status: DC | PRN

## 2023-10-07 MED ORDER — PROPOFOL 10 MG/ML IV BOLUS
INTRAVENOUS | Status: DC | PRN
  Administered 2023-10-07: 60 mg via INTRAVENOUS
  Administered 2023-10-07: 30 mg via INTRAVENOUS

## 2023-10-07 MED ORDER — DOCUSATE SODIUM 50 MG/5ML PO LIQD: 100.0000 mg | Freq: Two times a day (BID) | ORAL | Status: DC | PRN

## 2023-10-07 MED ORDER — DOCUSATE SODIUM 100 MG PO CAPS: 100.0000 mg | ORAL_CAPSULE | Freq: Two times a day (BID) | ORAL | Status: DC | PRN

## 2023-10-07 MED ORDER — POLYETHYLENE GLYCOL 3350 17 G PO PACK: 17.0000 g | PACK | Freq: Every day | ORAL | Status: DC | PRN

## 2023-10-07 MED ORDER — INSULIN ASPART 100 UNIT/ML IJ SOLN
0.0000 [IU] | INTRAMUSCULAR | Status: DC
  Administered 2023-10-07 (×2): 2 [IU] via SUBCUTANEOUS
  Administered 2023-10-08: 3 [IU] via SUBCUTANEOUS
  Administered 2023-10-08: 2 [IU] via SUBCUTANEOUS
  Administered 2023-10-08: 3 [IU] via SUBCUTANEOUS
  Administered 2023-10-08 (×3): 2 [IU] via SUBCUTANEOUS
  Administered 2023-10-09 – 2023-10-10 (×10): 3 [IU] via SUBCUTANEOUS
  Administered 2023-10-10 – 2023-10-12 (×5): 2 [IU] via SUBCUTANEOUS

## 2023-10-07 MED ORDER — PROPOFOL 10 MG/ML IV BOLUS
INTRAVENOUS | Status: AC
  Filled 2023-10-07: qty 20

## 2023-10-07 MED ORDER — OXYCODONE HCL 5 MG PO TABS
10.0000 mg | ORAL_TABLET | Freq: Four times a day (QID) | ORAL | Status: DC | PRN
  Administered 2023-10-07 – 2023-10-08 (×2): 10 mg
  Filled 2023-10-07 (×3): qty 2

## 2023-10-07 MED ORDER — PIPERACILLIN-TAZOBACTAM 3.375 G IVPB
3.3750 g | Freq: Three times a day (TID) | INTRAVENOUS | Status: DC
  Administered 2023-10-07 – 2023-10-20 (×38): 3.375 g via INTRAVENOUS
  Filled 2023-10-07 (×39): qty 50

## 2023-10-07 MED ORDER — DEXAMETHASONE SODIUM PHOSPHATE 10 MG/ML IJ SOLN
INTRAMUSCULAR | Status: DC | PRN
  Administered 2023-10-07: 10 mg via INTRAVENOUS

## 2023-10-07 MED ORDER — SODIUM CHLORIDE 0.9 % IV SOLN
150.0000 mg | INTRAVENOUS | Status: DC
  Administered 2023-10-07 – 2023-10-19 (×14): 150 mg via INTRAVENOUS
  Filled 2023-10-07 (×15): qty 7.5

## 2023-10-07 MED ORDER — POTASSIUM CHLORIDE 20 MEQ PO PACK: 40.0000 meq | PACK | Freq: Once | ORAL | Status: DC

## 2023-10-07 MED ORDER — LIDOCAINE 2% (20 MG/ML) 5 ML SYRINGE
INTRAMUSCULAR | Status: DC | PRN
  Administered 2023-10-07: 100 mg via INTRAVENOUS

## 2023-10-07 MED ORDER — HYDROMORPHONE HCL 1 MG/ML IJ SOLN
1.0000 mg | Freq: Once | INTRAMUSCULAR | Status: AC
  Administered 2023-10-07: 1 mg via INTRAVENOUS
  Filled 2023-10-07: qty 1

## 2023-10-07 MED ORDER — ONDANSETRON HCL 4 MG/2ML IJ SOLN
INTRAMUSCULAR | Status: AC
Start: 2023-10-07 — End: 2023-10-07
  Filled 2023-10-07: qty 2

## 2023-10-07 MED ORDER — MIDAZOLAM HCL 2 MG/2ML IJ SOLN
INTRAMUSCULAR | Status: DC | PRN
  Administered 2023-10-07: 2 mg via INTRAVENOUS

## 2023-10-07 MED ORDER — ROCURONIUM BROMIDE 10 MG/ML (PF) SYRINGE
PREFILLED_SYRINGE | INTRAVENOUS | Status: DC | PRN
  Administered 2023-10-07: 50 mg via INTRAVENOUS

## 2023-10-07 MED ORDER — FENTANYL CITRATE PF 50 MCG/ML IJ SOSY
25.0000 ug | PREFILLED_SYRINGE | INTRAMUSCULAR | Status: DC | PRN
  Administered 2023-10-07 – 2023-10-09 (×11): 50 ug via INTRAVENOUS
  Filled 2023-10-07 (×11): qty 1

## 2023-10-07 MED ORDER — LACTATED RINGERS IV BOLUS
2000.0000 mL | Freq: Once | INTRAVENOUS | Status: AC
  Administered 2023-10-07: 2000 mL via INTRAVENOUS

## 2023-10-07 MED ORDER — ACETAMINOPHEN 10 MG/ML IV SOLN: 1000.0000 mg | Freq: Once | INTRAVENOUS | Status: DC | PRN

## 2023-10-07 MED ORDER — MAGNESIUM SULFATE 2 GM/50ML IV SOLN
2.0000 g | Freq: Once | INTRAVENOUS | Status: AC
  Administered 2023-10-08: 2 g via INTRAVENOUS
  Filled 2023-10-07: qty 50

## 2023-10-07 MED ORDER — VASOPRESSIN 20 UNIT/ML IV SOLN
INTRAVENOUS | Status: AC
  Filled 2023-10-07: qty 1

## 2023-10-07 MED ORDER — IOHEXOL 350 MG/ML SOLN
75.0000 mL | Freq: Once | INTRAVENOUS | Status: AC | PRN
  Administered 2023-10-07: 75 mL via INTRAVENOUS

## 2023-10-07 MED ORDER — VANCOMYCIN HCL 2000 MG/400ML IV SOLN
2000.0000 mg | Freq: Once | INTRAVENOUS | Status: AC
  Administered 2023-10-07: 2000 mg via INTRAVENOUS
  Filled 2023-10-07: qty 400

## 2023-10-07 MED ORDER — SUCCINYLCHOLINE CHLORIDE 200 MG/10ML IV SOSY
PREFILLED_SYRINGE | INTRAVENOUS | Status: DC | PRN
  Administered 2023-10-07: 140 mg via INTRAVENOUS

## 2023-10-07 MED ORDER — ONDANSETRON HCL 4 MG/2ML IJ SOLN
4.0000 mg | Freq: Once | INTRAMUSCULAR | Status: AC
  Administered 2023-10-07: 4 mg via INTRAVENOUS
  Filled 2023-10-07: qty 2

## 2023-10-07 MED ORDER — EPHEDRINE SULFATE-NACL 50-0.9 MG/10ML-% IV SOSY
PREFILLED_SYRINGE | INTRAVENOUS | Status: DC | PRN
  Administered 2023-10-07: 5 mg via INTRAVENOUS

## 2023-10-07 MED ORDER — SODIUM CHLORIDE 0.9 % IV SOLN: INTRAVENOUS | Status: DC | PRN

## 2023-10-07 MED ORDER — MIDAZOLAM HCL 2 MG/2ML IJ SOLN
INTRAMUSCULAR | Status: AC
  Filled 2023-10-07: qty 2

## 2023-10-07 MED ORDER — PHENYLEPHRINE HCL-NACL 20-0.9 MG/250ML-% IV SOLN
INTRAVENOUS | Status: DC | PRN
  Administered 2023-10-07: 80 ug via INTRAVENOUS

## 2023-10-07 MED ORDER — FENTANYL CITRATE (PF) 250 MCG/5ML IJ SOLN
INTRAMUSCULAR | Status: DC | PRN
  Administered 2023-10-07 (×2): 50 ug via INTRAVENOUS

## 2023-10-07 MED ORDER — OXYCODONE HCL 5 MG PO TABS: 5.0000 mg | ORAL_TABLET | Freq: Once | ORAL | Status: DC | PRN

## 2023-10-07 MED ORDER — 0.9 % SODIUM CHLORIDE (POUR BTL) OPTIME
TOPICAL | Status: DC | PRN
  Administered 2023-10-07: 3000 mL

## 2023-10-07 MED ORDER — CHLORHEXIDINE GLUCONATE CLOTH 2 % EX PADS
6.0000 | MEDICATED_PAD | Freq: Every day | CUTANEOUS | Status: DC
  Administered 2023-10-07 – 2023-10-20 (×14): 6 via TOPICAL

## 2023-10-07 MED ORDER — METRONIDAZOLE 500 MG/100ML IV SOLN: 500.0000 mg | Freq: Two times a day (BID) | INTRAVENOUS | Status: DC

## 2023-10-07 MED ORDER — ACETAMINOPHEN 10 MG/ML IV SOLN
1000.0000 mg | Freq: Four times a day (QID) | INTRAVENOUS | Status: AC
  Administered 2023-10-07 – 2023-10-08 (×4): 1000 mg via INTRAVENOUS
  Filled 2023-10-07 (×4): qty 100

## 2023-10-07 MED ORDER — VANCOMYCIN VARIABLE DOSE PER UNSTABLE RENAL FUNCTION (PHARMACIST DOSING)
Status: DC
  Filled 2023-10-07: qty 1

## 2023-10-07 MED ORDER — METRONIDAZOLE 500 MG/100ML IV SOLN
500.0000 mg | Freq: Once | INTRAVENOUS | Status: AC
  Administered 2023-10-07: 500 mg via INTRAVENOUS
  Filled 2023-10-07: qty 100

## 2023-10-07 MED ORDER — POTASSIUM CHLORIDE 10 MEQ/100ML IV SOLN
10.0000 meq | INTRAVENOUS | Status: AC
  Administered 2023-10-07 – 2023-10-08 (×6): 10 meq via INTRAVENOUS
  Filled 2023-10-07 (×6): qty 100

## 2023-10-07 MED ORDER — FENTANYL CITRATE (PF) 250 MCG/5ML IJ SOLN
INTRAMUSCULAR | Status: AC
  Filled 2023-10-07: qty 5

## 2023-10-07 MED ORDER — OXYCODONE HCL 5 MG/5ML PO SOLN: 5.0000 mg | Freq: Once | ORAL | Status: DC | PRN

## 2023-10-07 MED ORDER — ONDANSETRON HCL 4 MG/2ML IJ SOLN
4.0000 mg | Freq: Once | INTRAMUSCULAR | Status: AC | PRN
  Administered 2023-10-07: 4 mg via INTRAVENOUS

## 2023-10-07 MED ORDER — PIPERACILLIN-TAZOBACTAM 3.375 G IVPB 30 MIN
3.3750 g | Freq: Once | INTRAVENOUS | Status: AC
  Administered 2023-10-07: 3.375 g via INTRAVENOUS
  Filled 2023-10-07: qty 50

## 2023-10-07 MED ORDER — ONDANSETRON HCL 4 MG/2ML IJ SOLN
INTRAMUSCULAR | Status: DC | PRN
  Administered 2023-10-07: 4 mg via INTRAVENOUS

## 2023-10-07 MED ORDER — SUGAMMADEX SODIUM 200 MG/2ML IV SOLN
INTRAVENOUS | Status: DC | PRN
  Administered 2023-10-07: 150 mg via INTRAVENOUS
  Administered 2023-10-07: 100 mg via INTRAVENOUS

## 2023-10-07 SURGICAL SUPPLY — 40 items
BLADE CLIPPER SURG (BLADE) IMPLANT
BNDG GAUZE DERMACEA FLUFF 4 (GAUZE/BANDAGES/DRESSINGS) IMPLANT
CANISTER SUCTION 3000ML PPV (SUCTIONS) ×2 IMPLANT
CHLORAPREP W/TINT 26 (MISCELLANEOUS) ×2 IMPLANT
COVER SURGICAL LIGHT HANDLE (MISCELLANEOUS) ×2 IMPLANT
DRAPE LAPAROSCOPIC ABDOMINAL (DRAPES) ×2 IMPLANT
DRAPE WARM FLUID 44X44 (DRAPES) ×2 IMPLANT
DRSG OPSITE POSTOP 4X10 (GAUZE/BANDAGES/DRESSINGS) IMPLANT
DRSG OPSITE POSTOP 4X8 (GAUZE/BANDAGES/DRESSINGS) IMPLANT
ELECT BLADE 6.5 EXT (BLADE) IMPLANT
ELECT CAUTERY BLADE 6.4 (BLADE) ×2 IMPLANT
ELECTRODE REM PT RTRN 9FT ADLT (ELECTROSURGICAL) ×2 IMPLANT
GAUZE PAD ABD 8X10 STRL (GAUZE/BANDAGES/DRESSINGS) IMPLANT
GLOVE BIO SURGEON STRL SZ7.5 (GLOVE) ×4 IMPLANT
GLOVE BIOGEL PI IND STRL 8 (GLOVE) ×2 IMPLANT
GOWN STRL REUS W/ TWL LRG LVL3 (GOWN DISPOSABLE) ×2 IMPLANT
GOWN STRL REUS W/ TWL XL LVL3 (GOWN DISPOSABLE) ×2 IMPLANT
HANDLE SUCTION POOLE (INSTRUMENTS) ×2 IMPLANT
KIT BASIN OR (CUSTOM PROCEDURE TRAY) ×2 IMPLANT
KIT TURNOVER KIT B (KITS) ×2 IMPLANT
LIGASURE IMPACT 36 18CM CVD LR (INSTRUMENTS) IMPLANT
PACK GENERAL/GYN (CUSTOM PROCEDURE TRAY) ×2 IMPLANT
PAD ARMBOARD POSITIONER FOAM (MISCELLANEOUS) ×2 IMPLANT
PENCIL SMOKE EVACUATOR (MISCELLANEOUS) ×2 IMPLANT
SOLN 0.9% NACL 1000 ML (IV SOLUTION) ×2 IMPLANT
SOLN 0.9% NACL POUR BTL 1000ML (IV SOLUTION) ×4 IMPLANT
SPECIMEN JAR LARGE (MISCELLANEOUS) IMPLANT
SPONGE T-LAP 18X18 ~~LOC~~+RFID (SPONGE) IMPLANT
STAPLER CVD CUT BLU 40 RELOAD (ENDOMECHANICALS) IMPLANT
STAPLER PROXIMATE 75MM BLUE (STAPLE) IMPLANT
STAPLER SKIN PROX 35W (STAPLE) ×2 IMPLANT
SUT PDS AB 1 TP1 54 (SUTURE) ×4 IMPLANT
SUT SILK 2 0 SH CR/8 (SUTURE) ×2 IMPLANT
SUT SILK 2 0 TIES 10X30 (SUTURE) ×2 IMPLANT
SUT SILK 3 0 SH CR/8 (SUTURE) ×2 IMPLANT
SUT SILK 3 0 TIES 10X30 (SUTURE) ×2 IMPLANT
TAPE PAPER 3X10 WHT MICROPORE (GAUZE/BANDAGES/DRESSINGS) IMPLANT
TOWEL GREEN STERILE (TOWEL DISPOSABLE) ×2 IMPLANT
TRAY FOLEY MTR SLVR 16FR STAT (SET/KITS/TRAYS/PACK) ×2 IMPLANT
YANKAUER SUCT BULB TIP NO VENT (SUCTIONS) IMPLANT

## 2023-10-07 NOTE — ED Provider Notes (Signed)
 [place] [facility] AT [facility] Provider Note   CSN: [id number] Arrival date & time: 10/07/23  1111     Patient presents with: Abdominal Pain  HP  [name] is a 63 y.o. female with history of bowel perforation, colitis, diabetes, ILD, vasculitis, immunosuppressed, CHF presenting for abdominal pain.  Started 7 days ago.  Located in a bandlike fashion about the mid abdomen and does radiate to the back at times.  She reports that she has not had a bowel movement in 3-1/2 weeks.  Also reporting decreased urinary output.  Denies blood in the urine.  States she is not passing gas.  Has not eaten that was yellow in 6 days.  Intermittently nauseous but no vomiting.  Denies chest pain or shortness of breath.  Denies use of opioids at home.    Abdominal Pain      Prior to Admission medications   Medication Sig Start Date End Date Taking? Authorizing Provider  ALPRAZolam  (XANAX ) 1 MG tablet Take 1 tablet (1 mg total) by mouth 3 (three) times daily as needed for anxiety. 06/30/19   Odell Celinda Balo, MD  buPROPion  (WELLBUTRIN  XL) 150 MG 24 hr tablet Take 450 mg by mouth. Patient taking differently: Take 450 mg by mouth daily at 6 (six) AM.    [provider]  carvedilol  (COREG ) 12.5 MG tablet Take 1 tablet (12.5 mg total) by mouth 2 (two) times daily. 07/10/23 10/08/23  Wyn Jackee VEAR Mickey., NP  citalopram  (CELEXA ) 40 MG tablet Take 40 mg by mouth daily.    [provider]  ergocalciferol (VITAMIN D2) 1.25 MG (50000 UT) capsule Take 50,000 Units by mouth once a week.    [provider]  esomeprazole (NEXIUM) 20 MG capsule 2 capsules Patient taking differently: Take 20 mg by mouth as needed.    [provider]  folic acid  (FOLVITE ) 1 MG tablet Take 1 mg by mouth daily.     [provider]  gabapentin  (NEURONTIN ) 600 MG tablet Take 600 mg by mouth 2 (two) times daily.    [provider]  methotrexate  (50 MG/ML) 1 g  injection Inject 0.6 mg into the vein every 7 (seven) days.    [provider]  Multiple Vitamin (MULTIVITAMIN) tablet Take 2 tablets by mouth daily.     [provider]  mycophenolate  (CELLCEPT ) 500 MG tablet Take 3 tablets (1,500 mg total) by mouth 2 (two) times daily. Patient taking differently: Take 500 mg by mouth 2 (two) times daily. Pt takes 3 tablets 1500 mg in the morning, and 3 tablets 1500 mg at night. 07/14/19   Odell Celinda Balo, MD  nitroGLYCERIN  (NITROSTAT ) 0.4 MG SL tablet Place 1 tablet (0.4 mg total) under the tongue every 5 (five) minutes as needed for chest pain. 09/22/21 06/28/23  Wyn Jackee VEAR Mickey., NP  OZEMPIC, 1 MG/DOSE, 4 MG/3ML SOPN 2 mg. Patient taking differently: Inject 2 mg into the skin once a week.    [provider]  phentermine  (ADIPEX-P ) 37.5 MG tablet Take by mouth. 02/08/21   [provider]  polyethylene glycol (MIRALAX  / GLYCOLAX ) packet Take 17 g by mouth daily as needed for moderate constipation (constipation).     [provider]  pregabalin (LYRICA) 100 MG capsule Take 100 mg by mouth 2 (two) times daily.    [provider]  RESTASIS 0.05 % ophthalmic emulsion Place 1 drop into both eyes 2 (two) times daily. 12/31/21   [provider]  sacubitril -valsartan  (ENTRESTO ) 49-51 MG Take 1 tablet by mouth 2 (two) times daily. 08/23/23   Nishan, Peter C, MD  Tiotropium Bromide Monohydrate  (SPIRIVA  RESPIMAT) 1.25 MCG/ACT AERS Inhale 2 puffs into the lungs daily. 03/12/23   Geronimo Amel, MD  tiZANidine (ZANAFLEX) 4 MG tablet Take 4 mg by mouth 3 (three) times daily. Patient not taking: Reported on 06/28/2023 03/06/21   [provider]  vitamin B-12 (CYANOCOBALAMIN) 500 MCG tablet Take 500 mcg by mouth daily.    [provider]    Allergies: Guaifenesin, Mucinex [guaifenesin er], and Sulfa antibiotics    Review of Systems  Gastrointestinal:  Positive for abdominal pain.    Updated  Vital Signs BP (!) 83/47   Pulse (!) 105   Temp (!) 100.7 F (38.2 C) (Oral)   Resp (!) 23   Ht 5' 9 (1.753 m)   Wt 117.9 kg   SpO2 97%   BMI 38.40 kg/m   Physical Exam Vitals and nursing note reviewed.  HENT:     Head: Normocephalic and atraumatic.     Mouth/Throat:     Mouth: Mucous membranes are moist.  Eyes:     General:        Right eye: No discharge.        Left eye: No discharge.     Conjunctiva/sclera: Conjunctivae normal.  Cardiovascular:     Rate and Rhythm: Regular rhythm. Tachycardia present.     Pulses: Normal pulses.     Heart sounds: Normal heart sounds.  Pulmonary:     Effort: Pulmonary effort is normal.     Breath sounds: Normal breath sounds.  Abdominal:     General: Abdomen is flat. There is no distension.     Palpations: Abdomen is soft.     Tenderness: There is generalized abdominal tenderness.  Skin:    General: Skin is warm and dry.  Neurological:     General: No focal deficit present.  Psychiatric:        Mood and Affect: Mood normal.     (all labs ordered are listed, but only abnormal results are displayed) Labs Reviewed  CBC WITH DIFFERENTIAL/PLATELET - Abnormal; Notable for the following components:      Result Value   WBC 12.3 (*)    Neutro Abs 11.1 (*)    Lymphs Abs 0.6 (*)    All other components within normal limits  COMPREHENSIVE METABOLIC PANEL WITH GFR - Abnormal; Notable for the following components:   Potassium 3.2 (*)    Glucose, Bld 148 (*)    Creatinine, Ser 1.44 (*)    Calcium  8.5 (*)    Albumin  2.9 (*)    Total Bilirubin 1.9 (*)    GFR, Estimated 41 (*)    All other components within normal limits  URINALYSIS, W/ REFLEX TO CULTURE (INFECTION SUSPECTED) - Abnormal; Notable for the following components:   Color, Urine AMBER (*)    APPearance CLOUDY (*)    Bilirubin Urine SMALL (*)    Ketones, ur 5 (*)    Protein, ur 100 (*)    Bacteria, UA MANY (*)    All other components within normal limits  URINE CULTURE   CULTURE, BLOOD (ROUTINE X 2)  CULTURE, BLOOD (ROUTINE X 2)  MRSA NEXT GEN BY PCR, NASAL  LIPASE, BLOOD  HIV ANTIBODY (ROUTINE TESTING W REFLEX)  HEMOGLOBIN A1C  I-STAT CG4 LACTIC ACID, ED  I-STAT CG4 LACTIC ACID, ED    EKG: None  Radiology: DG  Chest 1 View Result Date: 10/07/2023 CLINICAL DATA:  pre op EXAM: CHEST  1 VIEW COMPARISON:  July 15, 2019 FINDINGS: The cardiomediastinal silhouette is unchanged in contour. No pleural effusion. No pneumothorax. No acute pleuroparenchymal abnormality. IMPRESSION: No acute cardiopulmonary abnormality. Electronically Signed   By: Corean Salter M.D.   On: 10/07/2023 14:27   CT L-SPINE NO CHARGE Result Date: 10/07/2023 EXAM: CT OF THE LUMBAR SPINE WITH CONTRAST 10/07/2023 01:12:32 PM TECHNIQUE: CT of the lumbar spine was performed with the administration of 75 mL of iohexol  (OMNIPAQUE ) 350 MG/ML intravenous contrast. Multiplanar reformatted images are provided for review. Automated exposure control, iterative reconstruction, and/or weight based adjustment of the mA/kV was utilized to reduce the radiation dose to as low as reasonably achievable. COMPARISON: MR lumbar spine 04/09/2022. CLINICAL HISTORY: Back pain for 3 days. FINDINGS: BONES AND ALIGNMENT: Normal vertebral body heights. No acute fracture or suspicious bone lesion. Normal alignment. DEGENERATIVE CHANGES: Mild multilevel endplate degenerative changes. Posterior disc bulge/herniation is noted at L4-5 and L5-S1 SOFT TISSUES: No acute abnormality. IMPRESSION: 1. No acute fracture or suspicious bone lesion. 2. Posterior disc bulge/herniation at L4-5 and L5-S1. Electronically signed by: Waddell Calk MD 10/07/2023 01:55 PM EDT RP Workstation: [id number]   CT ABDOMEN PELVIS W CONTRAST Result Date: 10/07/2023 EXAM: CT ABDOMEN AND PELVIS WITH CONTRAST 10/07/2023 01:12:32 PM TECHNIQUE: CT of the abdomen and pelvis was performed with the administration of 75 mL of iohexol  (OMNIPAQUE ) 350 MG/ML  injection. Multiplanar reformatted images are provided for review. Automated exposure control, iterative reconstruction, and/or weight-based adjustment of the mA/kV was utilized to reduce the radiation dose to as low as reasonably achievable. COMPARISON: None available. CLINICAL HISTORY: Abdominal pain, acute, nonlocalized. FINDINGS: LOWER CHEST: No acute abnormality. LIVER: The liver is unremarkable. GALLBLADDER AND BILE DUCTS: Sludge ball versus noncalcified stone identified within the gallbladder measuring 1.9 cm, image 36/3. No biliary ductal dilatation. SPLEEN: No acute abnormality. PANCREAS: No acute abnormality. ADRENAL GLANDS: No acute abnormality. KIDNEYS, URETERS AND BLADDER: Wall thickening involving the bladder with surrounding soft tissue stranding likely reflects secondary inflammation to changes described below. No stones in the kidneys or ureters. No hydronephrosis. No perinephric or periureteral stranding. GI AND BOWEL: Stomach is normal. No pathologic dilatation of the bowel loops. There is a moderate amount of desiccated stool which distends the lumen of the sigmoid colon. There is marked wall thickening involving the sigmoid colon with severe surrounding inflammatory fat stranding and fluid. Signs of colonic perforation identified with stool exiting the bowel and extending into the small bowel mesentery along with multiple small foci of extraluminal gas axial image 71/3. Associated inflammatory changes extend throughout the lower abdominal and pelvic . Small amount of early fluid loculation between the anterior wall of the sigmoid colon and posterior wall of bladder measuring 2.7 x 2.0 cm, image 86/3. PERITONEUM AND RETROPERITONEUM: Diffuse inflammatory changes within the abdomen and pelvis. VASCULATURE: Aortic atherosclerosis. LYMPH NODES: Mildly enlarged lower abdominal and pelvic lymph nodes are likely reactive. REPRODUCTIVE ORGANS: No acute abnormality. BONES AND SOFT TISSUES: No acute  osseous abnormality. IMPRESSION: 1. Marked sigmoid colitis with perforation, evidenced by extraluminal gas and stool extending into the small bowel mesentery, with extensive inflammatory changes throughout the lower abdominal and pelvic mesentery and peritoneal thickening. 2. Small early fluid loculation (2.7 x 2.0 cm) between the anterior sigmoid colon and posterior bladder wall. 3. Gallbladder sludge ball versus noncalcified stone (1.9 cm). 4. Aortic atherosclerotic calcifications 5. Critical results were called to the clinical service at  the time of interpretation at 10/07/2023 - 1:51 pm. I spoke with Arman Loy K. Lang and delivered these results. Electronically signed by: Waddell Calk MD 10/07/2023 01:52 PM EDT RP Workstation: [id number]     .Critical Care  Performed by: Lang [name] POUR, PA-C Authorized by: Lang [name] POUR, PA-C   Critical care provider statement:    Critical care time (minutes):  75   Critical care was necessary to treat or prevent imminent or life-threatening deterioration of the following conditions:  Sepsis and shock (sigmoid perforation)   Critical care was time spent personally by me on the following activities:  Development of treatment plan with patient or surrogate, discussions with consultants, evaluation of patient's response to treatment, examination of patient, ordering and review of laboratory studies, ordering and review of radiographic studies, ordering and performing treatments and interventions, pulse oximetry, re-evaluation of patient's condition and review of old charts    Medications Ordered in the ED  piperacillin -tazobactam (ZOSYN ) IVPB 3.375 g (0 g Intravenous Stopped 10/07/23 1502)    Followed by  piperacillin -tazobactam (ZOSYN ) IVPB 3.375 g ( Intravenous Automatically Held 10/15/23 2200)  acetaminophen  (OFIRMEV ) IV 1,000 mg ( Intravenous Automatically Held 10/08/23 0600)  norepinephrine (LEVOPHED) 4mg  in (0.016 mg/mL) premix infusion (6 mcg/min  Intravenous Rate/Dose Change 10/07/23 1550)  vancomycin (VANCOREADY) IVPB 2000 mg/400 mL (has no administration in time range)  Chlorhexidine  Gluconate Cloth 2 % PADS 6 each (has no administration in time range)  docusate sodium  (COLACE) capsule 100 mg (has no administration in time range)  polyethylene glycol (MIRALAX  / GLYCOLAX ) packet 17 g (has no administration in time range)  micafungin (MYCAMINE) 150 mg in sodium chloride  0.9 % 100 mL IVPB (has no administration in time range)  insulin  aspart (novoLOG ) injection 0-15 Units (has no administration in time range)  0.9 % irrigation (POUR BTL) (3,000 mLs Irrigation Given 10/07/23 1550)  HYDROmorphone  (DILAUDID ) injection 1 mg (1 mg Intravenous Given 10/07/23 1138)  ondansetron  (ZOFRAN ) injection 4 mg (4 mg Intravenous Given 10/07/23 1137)  sodium chloride  0.9 % bolus 1,000 mL (0 mLs Intravenous Stopped 10/07/23 1310)  iohexol  (OMNIPAQUE ) 350 MG/ML injection 75 mL (75 mLs Intravenous Contrast Given 10/07/23 1300)  metroNIDAZOLE  (FLAGYL ) IVPB 500 mg (0 mg Intravenous Stopped 10/07/23 1507)  sodium chloride  0.9 % bolus 1,000 mL (0 mLs Intravenous Stopped 10/07/23 1501)  sodium chloride  0.9 % bolus 1,000 mL (1,000 mLs Intravenous New Bag/Given 10/07/23 1513)    Clinical Course as of 10/07/23 1556  Sun Oct 07, 2023  1343 Per radiology, sigmoid perforation with stool emptying into the bowel [JR]    Clinical Course User Index [JR] Lang [name] POUR, PA-C                                 Medical Decision Making Amount and/or Complexity of Data Reviewed Labs: ordered. Radiology: ordered.  Risk Prescription drug management. Decision regarding hospitalization.   Initial Impression and Ddx 63 year old well-appearing female presenting for abdominal pain.  Exam notable for generalized abdominal tenderness and tachycardia.  DDx includes bowel obstruction, bowel perforation, sepsis, other intra-abdominal infection, kidney stone, ovarian torsion,  other. Patient PMH that increases complexity of ED encounter:  history of bowel perforation, colitis, diabetes, ILD, vasculitis, immunosuppressed, CHF  Interpretation of Diagnostics - I independent reviewed and interpreted the labs as followed: Leukocytosis, hypokalemia 3.2, slight increase in creatinine over baseline  - I independently visualized the following imaging with scope of interpretation  limited to determining acute life threatening conditions related to emergency care: CT abdomen pelvis, which revealed sigmoid colitis with perforation  - I personally reviewed  Patient Reassessment and Ultimate Disposition/Management Discussed patient with Dr. Rubin of surgery who advised that he would come see her and likely she would need surgery later today.  Also discussed patient with critical care and spoke to NP Whitney as there is likely a plan to admit her to the ICU postop.  She is planning on also seeing her as well.  Patient became notably more tachycardic with a white count and now discovered sigmoid perforation.  This prompted immediate concern for sepsis as well.  Sent blood cultures and started her on IV antibiotics.  She then became hypotensive after 30 mL/kg of fluids (based on IBW) and spiked a fever.  Ordered another liter of fluids and started Levophed.  Gave IV Tylenol .  Was then transferred to the OR  Patient management required discussion with the following services or consulting groups:  Intensivist Service and General/Trauma Surgery  Complexity of Problems Addressed Acute complicated illness or Injury  Additional Data Reviewed and Analyzed Further history obtained from: Past medical history and medications listed in the EMR and Prior ED visit notes  Patient Encounter Risk Assessment Consideration of hospitalization      Final diagnoses:  Perforation of sigmoid colon (HCC)  Septic shock [facility])    ED Discharge Orders     None          Lang [name] POUR,  PA-C 10/07/23 1556    Laurice Maude BROCKS, MD 10/08/23 [phone]

## 2023-10-07 NOTE — Anesthesia Procedure Notes (Addendum)
 Arterial Line Insertion Start/End9/28/2025 3:30 PM, 10/07/2023 3:33 PM Performed by: CRNA  Patient location: OR. Preanesthetic checklist: patient identified, IV checked, site marked, risks and benefits discussed, surgical consent, monitors and equipment checked, pre-op evaluation, timeout performed and anesthesia consent Patient sedated Left, radial was placed  Attempts: 1 Procedure performed without using ultrasound guided technique. Following insertion, dressing applied and Biopatch. Post procedure assessment: normal  Patient tolerated the procedure well with no immediate complications.

## 2023-10-07 NOTE — Progress Notes (Signed)
 eLink Physician-Brief Progress Note Patient Name: [name] DOB: 1960/04/23 MRN: [id number]   Date of Service  10/07/2023  HPI/Events of Note  Labs reviewed K 3.2, creatinine 1.37 from 1.44  eICU Interventions  Ordered KCl 10 meqs x 6 doses Add on Magnesium  level     Intervention Category Intermediate Interventions: Electrolyte abnormality - evaluation and management  [name] 10/07/2023, 9:12 PM

## 2023-10-07 NOTE — Anesthesia Preprocedure Evaluation (Addendum)
 Anesthesia Evaluation  Patient identified by MRN, date of birth, ID band Patient awake    Reviewed: NPO status , Patient's Chart, lab work & pertinent test resultsPreop documentation limited or incomplete due to emergent nature of procedure.  History of Anesthesia Complications Negative for: history of anesthetic complications  Airway Mallampati: I  TM Distance: >3 FB     Dental no notable dental hx.    Pulmonary shortness of breath, COPD, former smoker   breath sounds clear to auscultation       Cardiovascular hypertension, (-) angina (-) CAD, (-) Past MI and (-) Cardiac Stents  Rhythm:Regular Rate:Normal  IMPRESSIONS     1. Left ventricular ejection fraction, by estimation, is 60 to 65%. The  left ventricle has normal function. The left ventricle has no regional  wall motion abnormalities. Left ventricular diastolic parameters were  normal.   2. Right ventricular systolic function is normal. The right ventricular  size is normal. There is normal pulmonary artery systolic pressure. The  estimated right ventricular systolic pressure is 18.8 mmHg.   3. The mitral valve is normal in structure. Trivial mitral valve  regurgitation. No evidence of mitral stenosis.   4. The aortic valve is tricuspid. Aortic valve regurgitation is not  visualized. No aortic stenosis is present.   5. The inferior vena cava is normal in size with greater than 50%  respiratory variability, suggesting right atrial pressure of 3 mmHg.     Neuro/Psych  Headaches  Neuromuscular disease    GI/Hepatic ,GERD  ,,Bowel perforation   Endo/Other  diabetes    Renal/GU ARFRenal disease     Musculoskeletal   Abdominal   Peds  Hematology  (+) Blood dyscrasia, anemia   Anesthesia Other Findings   Reproductive/Obstetrics                              Anesthesia Physical Anesthesia Plan  ASA: 3 and emergent  Anesthesia  Plan: General   Post-op Pain Management:    Induction: Intravenous, Rapid sequence and Cricoid pressure planned  PONV Risk Score and Plan: 2 and Ondansetron   Airway Management Planned: Oral ETT and Video Laryngoscope Planned  Additional Equipment: Arterial line  Intra-op Plan:   Post-operative Plan: Extubation in OR and Possible Post-op intubation/ventilation  Informed Consent: I have reviewed the patients History and Physical, chart, labs and discussed the procedure including the risks, benefits and alternatives for the proposed anesthesia with the patient or authorized representative who has indicated his/her understanding and acceptance.     Dental advisory given  Plan Discussed with: CRNA  Anesthesia Plan Comments:          Anesthesia Quick Evaluation

## 2023-10-07 NOTE — ED Triage Notes (Signed)
 Pt BIB EMS from home for left sided abdominal pain x7 days and back pain x3 days. Pt reports no bowel movement x3 weeks and decreased urine output. Pt reports I have not eaten anything in 6 days   EMS administered 200 mcg of fentanyl  en route   CBG 138

## 2023-10-07 NOTE — Progress Notes (Signed)
 Found pt left AC IV catheter half way out with levophed infusing. No redness or swelling noted at this moment.

## 2023-10-07 NOTE — Anesthesia Procedure Notes (Addendum)
 Procedure Name: Intubation Date/Time: 10/07/2023 3:31 PM  Performed by: Loreli Blima LABOR, CRNAPre-anesthesia Checklist: Patient identified, Emergency Drugs available, Suction available and Patient being monitored Patient Re-evaluated:Patient Re-evaluated prior to induction Oxygen Delivery Method: Circle System Utilized Preoxygenation: Pre-oxygenation with 100% oxygen Induction Type: IV induction and Rapid sequence Laryngoscope Size: Mac and 3 Grade View: Grade I Tube type: Oral Tube size: 7.0 mm Number of attempts: 1 Airway Equipment and Method: Stylet Placement Confirmation: ETT inserted through vocal cords under direct vision, positive ETCO2 and breath sounds checked- equal and bilateral Secured at: 22 cm Tube secured with: Tape Dental Injury: Teeth and Oropharynx as per pre-operative assessment

## 2023-10-07 NOTE — H&P (Addendum)
 NAME:  SILVER ACHEY, MRN:  [id number], DOB:  1961-01-01, LOS: 0 ADMISSION DATE:  10/07/2023, CONSULTATION DATE:  10/07/2023 REFERRING MD: ED, CHIEF COMPLAINT: Sigmoid perforation  History of Present Illness:  [name] is a 63 year old female with extensive past medical history significant for but not limited to ANCA associated vasculitis, previous diffuse pulmonary alveolar hemorrhage, chronic immunosuppression type 2 diabetes, HTN, HLD, ILD, previous sigmoid colon perforation, anxiety who presented to the ED via EMS with left sided abdominal pain that began 7 days prior to admission with now associated radiation to back.  Also reports decreased oral intake.  On ED arrival patient was found mildly hypothermic but later developed mild fever of 100.7, tachypneic, tachycardic, and hypotensive.  Lab work significant for K3.2, glucose 148, creatinine 1.44 with GFR 41, albumin  2.9, WBC 12.3.  CT abdomen pelvis was obtained which revealed marked sigmoid colitis with perforation evidenced by extraluminal gas and stool extending into the small bowel mesentery with extensive inflammation and early fluid collection.  Given concern for evolving sepsis PCCM consulted for further management and admission, general surgery planning urgent for surgical evaluation  Pertinent  Medical History  ANCA associated vasculitis, previous diffuse pulmonary alveolar hemorrhage, chronic immunosuppression type 2 diabetes, HTN, HLD, ILD, previous sigmoid colon perforation, anxiety  Significant Hospital Events: Including procedures, antibiotic start and stop dates in addition to other pertinent events   9/28 presented for complaints of abdominal pain with nausea and vomiting, CT confirms sigmoid perforation.  Early signs of sepsis on admission, PCCM consulted for admission  Interim History / Subjective:  Seen sitting up in bed in no acute distress.  She is very concerned about the idea of surgery  Objective    Blood  pressure (!) 80/51, pulse (!) 104, temperature (!) 100.7 F (38.2 C), temperature source Oral, resp. rate 18, height 5' 9 (1.753 m), weight 117.9 kg, SpO2 98%.        Intake/Output Summary (Last 24 hours) at 10/07/2023 1504 Last data filed at 10/07/2023 1501 Gross per 24 hour  Intake 2000 ml  Output --  Net 2000 ml   Filed Weights   10/07/23 1121  Weight: 117.9 kg    Examination: General: Acute on chronic well-appearing obese middle-aged female sitting up in bed in no acute distress HEENT: [place]/AT, MM pink/moist, PERRL,  Neuro: Alert and oriented x 3, nonfocal CV: s1s2 regular rate and rhythm, no murmur, rubs, or gallops,  PULM: Clear to auscultation bilaterally, no increased work of breathing, no added breath GI: soft, bowel sounds active in all 4 quadrants, non-tender, non-distended Extremities: warm/dry, no edema  Skin: no rashes or lesions   Resolved problem list   Assessment and Plan  Sepsis in the setting of sigmoid perforation -Patient initially presented hypothermic but developed low-grade fever while in the ED coupled with tachypnea, tachycardia and hypotension with leukocytosis in the setting of known colon perforation P: Admit ICU post OR Supplemental oxygen as needed for sat goal greater than 92 Pan cultures prior to antibiotic Continue IV Zosyn , vancomycin, and add antifungal given immunosuppression Aggressive IV hydration Pressors as needed for MAP goal greater than 65 Monitor urine output  ANCA associated vasculitis ILD Previous diffuse pulmonary alveolar hemorrhage Chronic immunosuppression  -Patient is on CellCept  at baseline P: Hold immunosuppression Antibiotics as above Encourage pulmonary hygiene Aspiration precautions  Type 2 diabetes - Medication reconciliation not yet completed but it appears patient utilizes Ozempic at baseline P: SSI CBG goal 140-180 CBG checks every 4  Nonischemic cardiomyopathy -Most recent echocardiogram 10/19/2021  with EF of 60 to 65%, no WMA, and normal RV function CAD Essential hypertension Hyperlipidemia P: Continuous telemetry  Hold home beta-blocker and Entresto  given hypotension Strict intake and output Consider updating echo Daily weight to assess volume status Closely monitor renal function and electrolytes   Anxiety P: Hold home Xanax  and Wellbutrin , consider resuming   Labs   CBC: Recent Labs  Lab 10/07/23 1132  WBC 12.3*  NEUTROABS 11.1*  HGB 13.5  HCT 41.2  MCV 86.7  PLT 267    Basic Metabolic Panel: Recent Labs  Lab 10/07/23 1132  NA 136  K 3.2*  CL 102  CO2 22  GLUCOSE 148*  BUN 18  CREATININE 1.44*  CALCIUM  8.5*   GFR: Estimated Creatinine Clearance: 55.6 mL/min (A) (by C-G formula based on SCr of 1.44 mg/dL (H)). Recent Labs  Lab 10/07/23 1132 10/07/23 1420  WBC 12.3*  --   LATICACIDVEN  --  1.9    Liver Function Tests: Recent Labs  Lab 10/07/23 1132  AST 15  ALT 10  ALKPHOS 79  BILITOT 1.9*  PROT 6.9  ALBUMIN  2.9*   Recent Labs  Lab 10/07/23 1132  LIPASE 14   No results for input(s): AMMONIA in the last 168 hours.  ABG    Component Value Date/Time   PHART 7.429 06/23/2013 2120   PCO2ART 38.6 06/23/2013 2120   PO2ART 62.4 (L) 06/23/2013 2120   HCO3 25.1 (H) 06/23/2013 2120   TCO2 32 07/19/2013 0909   O2SAT 91.2 06/23/2013 2120     Coagulation Profile: No results for input(s): INR, PROTIME in the last 168 hours.  Cardiac Enzymes: No results for input(s): CKTOTAL, CKMB, CKMBINDEX, TROPONINI in the last 168 hours.  HbA1C: Hgb A1c MFr Bld  Date/Time Value Ref Range Status  05/30/2019 09:00 AM 7.0 (H) 4.8 - 5.6 % Final    Comment:    (NOTE) Pre diabetes:          5.7%-6.4% Diabetes:              >6.4% Glycemic control for   <7.0% adults with diabetes     CBG: No results for input(s): GLUCAP in the last 168 hours.  Review of Systems:   Please see the history of present illness. All other systems  reviewed and are negative    Past Medical History:  She,  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), Diverticulitis of large intestine with perforation (09/23/2012), Dyspnea (05/11/2014), GERD (gastroesophageal reflux disease), Hemoptysis (06/23/2013), History of endometriosis (1997), tobacco use, presenting hazards to health (09/23/2012), Hyperglycemia (07/24/2013), Hypertension, Hypokalemia (07/20/2013), ILD (interstitial lung disease) (HCC) (01/22/2014), Leukocytosis (05/11/2014), MPA (microscopic polyangiitis) (HCC) (2015), Numbness, Obesity (BMI 30-39.9) (09/23/2012), Paresthesia (12/26/2016), Perforation of sigmoid colon - stercoral (09/27/2012), Physical deconditioning (04/30/2015), Pulmonary alveolar hemorrhage (09/03/2013), Respiratory failure with hypoxia (HCC) (06/23/2013), and Right leg swelling (12/26/2016).   Surgical History:   Past Surgical History:  Procedure Laterality Date   APPENDECTOMY     BLADDER REPAIR     DILATATION & CURETTAGE/HYSTEROSCOPY WITH MYOSURE N/A 06/03/2019   Procedure: DILATATION & CURETTAGE/HYSTEROSCOPY WITH MYOSURE;  Surgeon: Curlene Agent, MD;  Location: [facility] OR;  Service: Gynecology;  Laterality: N/A;   IR CATHETER TUBE CHANGE  06/24/2019   IR FLUORO GUIDE CV LINE RIGHT  06/16/2019   IR RADIOLOGIST EVAL & MGMT  07/29/2019   IR US  GUIDE VASC ACCESS RIGHT  06/16/2019   LAPAROSCOPY  1997   dx of endometriosis   LAPAROSCOPY  04/28/2000    Laparoscopy with lysis of adhesions, hysteroscopy, D&C.   TONSILLECTOMY       Social History:    reports that she quit smoking about 22 years ago. Her smoking use included cigarettes. She started smoking about 49 years ago. She has a 27 pack-year smoking history. She has never used smokeless tobacco. She reports that she does not drink alcohol and does not use drugs.   Family History:  Her family history includes Birth defects in her cousin; Breast cancer in her paternal grandmother; CAD in an other family member; Diabetes in her father and mother; Hypertension in her father; Obesity in her father.   Allergies Allergies  Allergen Reactions   Guaifenesin Nausea And Vomiting and Other (See Comments)    un   Mucinex [Guaifenesin Er] Nausea And Vomiting   Sulfa Antibiotics Nausea And Vomiting     Home Medications  Prior to Admission medications   Medication Sig Start Date End Date Taking? Authorizing Provider  ALPRAZolam  (XANAX ) 1 MG tablet Take 1 tablet (1 mg total) by mouth 3 (three) times daily as needed for anxiety. 06/30/19   Odell Celinda Balo, MD  buPROPion  (WELLBUTRIN  XL) 150 MG 24 hr tablet Take 450 mg by mouth. Patient taking differently: Take 450 mg by mouth daily at 6 (six) AM.    [provider]  carvedilol  (COREG ) 12.5 MG tablet Take 1 tablet (12.5 mg total) by mouth 2 (two) times daily. 07/10/23 10/08/23  Wyn Jackee VEAR Mickey., NP  citalopram  (CELEXA ) 40 MG tablet Take 40 mg by mouth daily.    [provider]  ergocalciferol (VITAMIN D2) 1.25 MG (50000 UT) capsule Take 50,000 Units by mouth once a week.    [provider]  esomeprazole (NEXIUM) 20 MG capsule 2 capsules Patient taking differently: Take 20 mg by mouth as needed.    [provider]  folic acid  (FOLVITE ) 1 MG tablet Take 1 mg by mouth daily.     [provider]  gabapentin  (NEURONTIN ) 600 MG tablet Take 600 mg by mouth 2 (two) times daily.    [provider]  methotrexate  (50 MG/ML) 1 g injection Inject 0.6 mg into the vein every 7 (seven) days.    [provider]  Multiple Vitamin (MULTIVITAMIN) tablet Take 2 tablets by mouth daily.     [provider]  mycophenolate  (CELLCEPT ) 500 MG tablet Take 3 tablets (1,500 mg total) by mouth 2 (two) times daily. Patient taking differently: Take 500 mg by mouth 2 (two) times daily. Pt takes 3 tablets 1500 mg in the morning, and 3 tablets 1500 mg at night. 07/14/19   Odell Celinda Balo, MD  nitroGLYCERIN  (NITROSTAT ) 0.4 MG SL tablet Place 1 tablet (0.4 mg total) under the tongue every 5 (five) minutes as needed for chest pain. 09/22/21 06/28/23  Wyn Jackee VEAR Mickey., NP  OZEMPIC, 1 MG/DOSE, 4 MG/3ML SOPN 2 mg. Patient taking differently: Inject 2 mg into the skin once a week.    [provider]  phentermine  (ADIPEX-P ) 37.5 MG tablet Take by mouth. 02/08/21   [provider]  polyethylene glycol (MIRALAX  / GLYCOLAX ) packet Take 17 g by mouth daily  as needed for moderate constipation (constipation).     [provider]  pregabalin (LYRICA) 100 MG capsule Take 100 mg by mouth 2 (two) times daily.    [provider]  RESTASIS 0.05 % ophthalmic emulsion Place 1 drop into both eyes 2 (two) times daily. 12/31/21   [provider]  sacubitril -valsartan  (ENTRESTO ) 49-51 MG Take 1 tablet by mouth 2 (two) times daily. 08/23/23   Nishan, Peter C, MD  Tiotropium Bromide Monohydrate  (SPIRIVA  RESPIMAT) 1.25 MCG/ACT AERS Inhale 2 puffs into the lungs daily. 03/12/23   Geronimo Amel, MD  tiZANidine (ZANAFLEX) 4 MG tablet Take 4 mg by mouth 3 (three) times daily. Patient not taking: Reported on 06/28/2023 03/06/21   [provider]  vitamin B-12 (CYANOCOBALAMIN) 500 MCG tablet Take 500 mcg by mouth daily.    [provider]     Critical care time:   CRITICAL CARE Performed by: Adja Ruff D. Harris   Total critical care time: 40 minutes  Critical care time was exclusive of separately billable procedures and treating other patients.  Critical care was  necessary to treat or prevent imminent or life-threatening deterioration.  Critical care was time spent personally by me on the following activities: development of treatment plan with patient and/or surrogate as well as nursing, discussions with consultants, evaluation of patient's response to treatment, examination of patient, obtaining history from patient or surrogate, ordering and performing treatments and interventions, ordering and review of laboratory studies, ordering and review of radiographic studies, pulse oximetry and re-evaluation of patient's condition.  Sota Hetz D. Harris, NP-C [facility] Personal contact information can be found on Amion  If no contact or response made please call [phone] 10/07/2023, 3:27 PM

## 2023-10-07 NOTE — ED Notes (Signed)
 Rubin MD at bedside.

## 2023-10-07 NOTE — Consult Note (Signed)
 Reason for Consult: Sigmoid perforation Referring Physician: Dr. Laurice Lenward FORBES [name] is an 63 y.o. female.  HPI: Patient is a 63 year old female, history of anemia, vasculitis on methotrexate  and CellCept , history of constipation.  Patient states that she has had a 3-week history of constipation.  States that she has had minimal bowel movements and has been struggling to have bowel movements.  She has been trying to stay hydrated.  States that she stopped eating approximately a week ago.  She states that she did this in hopes that this would allow her to have a bowel movement.  She states that she had decreased urinary output 2 days ago.  She came to the ER secondary to continued abdominal pain discomfort and decreased urinary output.  Patient has history of a right cecal perforation managed with drain placement in 2021.  She denies any other previous abdominal surgery.  Past Medical History:  Diagnosis Date   Acute blood loss anemia 06/23/2013   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HCC)    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HCC) 06/24/2013   Anxiety associated with depression 09/23/2012   At high risk for falls 09/23/2012   She has had a fractured ankle and tendon tear with falls over the last couple years.   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   Diverticulitis of large intestine with perforation 09/23/2012   Dyspnea 05/11/2014   GERD (gastroesophageal reflux disease)    Hemoptysis 06/23/2013   History of endometriosis 1997   Hx of tobacco use, presenting hazards to health 09/23/2012   Hyperglycemia 07/24/2013   Hypertension    Hypokalemia 07/20/2013   ILD (interstitial lung disease) (HCC) 01/22/2014   Leukocytosis 05/11/2014   MPA (microscopic polyangiitis) (HCC) 2015    Numbness    Obesity (BMI 30-39.9) 09/23/2012   Paresthesia 12/26/2016   Perforation of sigmoid colon - stercoral 09/27/2012   Physical deconditioning 04/30/2015   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   Respiratory failure with hypoxia (HCC) 06/23/2013   Right leg swelling 12/26/2016    Past Surgical History:  Procedure Laterality Date   APPENDECTOMY     BLADDER REPAIR     DILATATION & CURETTAGE/HYSTEROSCOPY WITH MYOSURE N/A 06/03/2019   Procedure: DILATATION & CURETTAGE/HYSTEROSCOPY WITH MYOSURE;  Surgeon: Curlene Agent, MD;  Location: [facility] OR;  Service: Gynecology;  Laterality: N/A;   IR CATHETER TUBE CHANGE  06/24/2019   IR FLUORO GUIDE CV LINE RIGHT  06/16/2019   IR RADIOLOGIST EVAL & MGMT  07/29/2019   IR US  GUIDE VASC ACCESS RIGHT  06/16/2019   LAPAROSCOPY  1997   dx of endometriosis   LAPAROSCOPY  04/28/2000    Laparoscopy with lysis of adhesions, hysteroscopy, D&C.   TONSILLECTOMY      Family History  Problem Relation Age of Onset   Diabetes Mother        AODM   Diabetes Father        AODM   Hypertension Father    Obesity Father    Breast cancer Paternal Grandmother    Birth defects Cousin    CAD Other        [facility]    Social History:  reports that she quit smoking about 22 years ago. Her smoking use included cigarettes. She started smoking about 49 years ago. She has a 27 pack-year  smoking history. She has never used smokeless tobacco. She reports that she does not drink alcohol and does not use drugs.  Allergies:  Allergies  Allergen Reactions   Guaifenesin Nausea And Vomiting and Other (See Comments)    un   Mucinex [Guaifenesin Er] Nausea And Vomiting   Sulfa Antibiotics Nausea And Vomiting    Medications: I have reviewed the patient's current medications.  Results for orders placed or performed during the hospital encounter of 10/07/23 (from the past 48 hours)  CBC with Differential     Status: Abnormal   Collection Time: 10/07/23 11:32 AM  Result Value Ref  Range   WBC 12.3 (H) 4.0 - 10.5 K/uL   RBC 4.75 3.87 - 5.11 MIL/uL   Hemoglobin 13.5 12.0 - 15.0 g/dL   HCT 58.7 63.9 - 53.9 %   MCV 86.7 80.0 - 100.0 fL   MCH 28.4 26.0 - 34.0 pg   MCHC 32.8 30.0 - 36.0 g/dL   RDW 88.0 88.4 - 84.4 %   Platelets 267 150 - 400 K/uL   nRBC 0.0 0.0 - 0.2 %   Neutrophils Relative % 91 %   Neutro Abs 11.1 (H) 1.7 - 7.7 K/uL   Lymphocytes Relative 5 %   Lymphs Abs 0.6 (L) 0.7 - 4.0 K/uL   Monocytes Relative 4 %   Monocytes Absolute 0.5 0.1 - 1.0 K/uL   Eosinophils Relative 0 %   Eosinophils Absolute 0.0 0.0 - 0.5 K/uL   Basophils Relative 0 %   Basophils Absolute 0.0 0.0 - 0.1 K/uL   Immature Granulocytes 0 %   Abs Immature Granulocytes 0.05 0.00 - 0.07 K/uL    Comment: Performed at [facility], [address] [place], [place] [address]  Comprehensive metabolic panel     Status: Abnormal   Collection Time: 10/07/23 11:32 AM  Result Value Ref Range   Sodium 136 135 - 145 mmol/L   Potassium 3.2 (L) 3.5 - 5.1 mmol/L   Chloride 102 98 - 111 mmol/L   CO2 22 22 - 32 mmol/L   Glucose, Bld 148 (H) 70 - 99 mg/dL    Comment: Glucose reference range applies only to samples taken after fasting for at least 8 hours.   BUN 18 8 - 23 mg/dL   Creatinine, Ser 8.55 (H) 0.44 - 1.00 mg/dL   Calcium  8.5 (L) 8.9 - 10.3 mg/dL   Total Protein 6.9 6.5 - 8.1 g/dL   Albumin  2.9 (L) 3.5 - 5.0 g/dL   AST 15 15 - 41 U/L   ALT 10 0 - 44 U/L   Alkaline Phosphatase 79 38 - 126 U/L   Total Bilirubin 1.9 (H) 0.0 - 1.2 mg/dL   GFR, Estimated 41 (L) >60 mL/min    Comment: (NOTE) Calculated using the CKD-EPI Creatinine Equation (2021)    Anion gap 12 5 - 15    Comment: Performed at [facility], [address], [place], [place] [address]  Lipase, blood     Status: None   Collection Time: 10/07/23 11:32 AM  Result Value Ref Range   Lipase 14 11 - 51 U/L    Comment: Performed at [facility], [address] [place], [place] [address]   Urinalysis, w/ Reflex to Culture (Infection Suspected) -Urine, Clean Catch     Status: Abnormal   Collection Time: 10/07/23 12:24 PM  Result Value Ref Range   Specimen Source URINE, CLEAN CATCH    Color, Urine AMBER (  A) YELLOW    Comment: BIOCHEMICALS MAY BE AFFECTED BY COLOR   APPearance CLOUDY (A) CLEAR   Specific Gravity, Urine 1.019 1.005 - 1.030   pH 5.0 5.0 - 8.0   Glucose, UA NEGATIVE NEGATIVE mg/dL   Hgb urine dipstick NEGATIVE NEGATIVE   Bilirubin Urine SMALL (A) NEGATIVE   Ketones, ur 5 (A) NEGATIVE mg/dL   Protein, ur 899 (A) NEGATIVE mg/dL   Nitrite NEGATIVE NEGATIVE   Leukocytes,Ua NEGATIVE NEGATIVE   RBC / HPF 0-5 0 - 5 RBC/hpf   WBC, UA 11-20 0 - 5 WBC/hpf    Comment:        Reflex urine culture not performed if WBC <=10, OR if Squamous epithelial cells >5. If Squamous epithelial cells >5 suggest recollection.    Bacteria, UA MANY (A) NONE SEEN   Squamous Epithelial / HPF 0-5 0 - 5 /HPF   Mucus PRESENT     Comment: Performed at [facility], [address], [place], [place] [address]  I-Stat Lactic Acid     Status: None   Collection Time: 10/07/23  2:20 PM  Result Value Ref Range   Lactic Acid, Venous 1.9 0.5 - 1.9 mmol/L    DG Chest 1 View Result Date: 10/07/2023 CLINICAL DATA:  pre op EXAM: CHEST  1 VIEW COMPARISON:  July 15, 2019 FINDINGS: The cardiomediastinal silhouette is unchanged in contour. No pleural effusion. No pneumothorax. No acute pleuroparenchymal abnormality. IMPRESSION: No acute cardiopulmonary abnormality. Electronically Signed   By: Corean Salter M.D.   On: 10/07/2023 14:27   CT L-SPINE NO CHARGE Result Date: 10/07/2023 EXAM: CT OF THE LUMBAR SPINE WITH CONTRAST 10/07/2023 01:12:32 PM TECHNIQUE: CT of the lumbar spine was performed with the administration of 75 mL of iohexol  (OMNIPAQUE ) 350 MG/ML intravenous contrast. Multiplanar reformatted images are provided for review. Automated exposure control, iterative reconstruction, and/or  weight based adjustment of the mA/kV was utilized to reduce the radiation dose to as low as reasonably achievable. COMPARISON: MR lumbar spine 04/09/2022. CLINICAL HISTORY: Back pain for 3 days. FINDINGS: BONES AND ALIGNMENT: Normal vertebral body heights. No acute fracture or suspicious bone lesion. Normal alignment. DEGENERATIVE CHANGES: Mild multilevel endplate degenerative changes. Posterior disc bulge/herniation is noted at L4-5 and L5-S1 SOFT TISSUES: No acute abnormality. IMPRESSION: 1. No acute fracture or suspicious bone lesion. 2. Posterior disc bulge/herniation at L4-5 and L5-S1. Electronically signed by: Waddell Calk MD 10/07/2023 01:55 PM EDT RP Workstation: [id number]   CT ABDOMEN PELVIS W CONTRAST Result Date: 10/07/2023 EXAM: CT ABDOMEN AND PELVIS WITH CONTRAST 10/07/2023 01:12:32 PM TECHNIQUE: CT of the abdomen and pelvis was performed with the administration of 75 mL of iohexol  (OMNIPAQUE ) 350 MG/ML injection. Multiplanar reformatted images are provided for review. Automated exposure control, iterative reconstruction, and/or weight-based adjustment of the mA/kV was utilized to reduce the radiation dose to as low as reasonably achievable. COMPARISON: None available. CLINICAL HISTORY: Abdominal pain, acute, nonlocalized. FINDINGS: LOWER CHEST: No acute abnormality. LIVER: The liver is unremarkable. GALLBLADDER AND BILE DUCTS: Sludge ball versus noncalcified stone identified within the gallbladder measuring 1.9 cm, image 36/3. No biliary ductal dilatation. SPLEEN: No acute abnormality. PANCREAS: No acute abnormality. ADRENAL GLANDS: No acute abnormality. KIDNEYS, URETERS AND BLADDER: Wall thickening involving the bladder with surrounding soft tissue stranding likely reflects secondary inflammation to changes described below. No stones in the kidneys or ureters. No hydronephrosis. No perinephric or periureteral stranding. GI AND BOWEL: Stomach is normal. No pathologic dilatation of the bowel loops.  There  is a moderate amount of desiccated stool which distends the lumen of the sigmoid colon. There is marked wall thickening involving the sigmoid colon with severe surrounding inflammatory fat stranding and fluid. Signs of colonic perforation identified with stool exiting the bowel and extending into the small bowel mesentery along with multiple small foci of extraluminal gas axial image 71/3. Associated inflammatory changes extend throughout the lower abdominal and pelvic . Small amount of early fluid loculation between the anterior wall of the sigmoid colon and posterior wall of bladder measuring 2.7 x 2.0 cm, image 86/3. PERITONEUM AND RETROPERITONEUM: Diffuse inflammatory changes within the abdomen and pelvis. VASCULATURE: Aortic atherosclerosis. LYMPH NODES: Mildly enlarged lower abdominal and pelvic lymph nodes are likely reactive. REPRODUCTIVE ORGANS: No acute abnormality. BONES AND SOFT TISSUES: No acute osseous abnormality. IMPRESSION: 1. Marked sigmoid colitis with perforation, evidenced by extraluminal gas and stool extending into the small bowel mesentery, with extensive inflammatory changes throughout the lower abdominal and pelvic mesentery and peritoneal thickening. 2. Small early fluid loculation (2.7 x 2.0 cm) between the anterior sigmoid colon and posterior bladder wall. 3. Gallbladder sludge ball versus noncalcified stone (1.9 cm). 4. Aortic atherosclerotic calcifications 5. Critical results were called to the clinical service at the time of interpretation at 10/07/2023 - 1:51 pm. I spoke with John K. Lang and delivered these results. Electronically signed by: Waddell Calk MD 10/07/2023 01:52 PM EDT RP Workstation: [id number]    Review of Systems  Constitutional:  Negative for chills and fever.  HENT:  Negative for ear discharge, hearing loss and sore throat.   Eyes:  Negative for discharge.  Respiratory:  Positive for shortness of breath. Negative for cough.   Cardiovascular:   Negative for chest pain and leg swelling.  Gastrointestinal:  Positive for abdominal pain and constipation. Negative for diarrhea, nausea and vomiting.  Musculoskeletal:  Negative for myalgias and neck pain.  Skin:  Negative for rash.  Allergic/Immunologic: Negative for environmental allergies.  Neurological:  Negative for dizziness and seizures.  Hematological:  Does not bruise/bleed easily.  Psychiatric/Behavioral:  Negative for suicidal ideas.   All other systems reviewed and are negative.  Blood pressure (!) 77/50, pulse (!) 105, temperature (!) 100.7 F (38.2 C), temperature source Oral, resp. rate (!) 21, height 5' 9 (1.753 m), weight 117.9 kg, SpO2 97%. Physical Exam Vitals reviewed.  Constitutional:      Appearance: She is well-developed.     Comments: Conversant No acute distress  HENT:     Head: Normocephalic and atraumatic.  Eyes:     General: Lids are normal. No scleral icterus.    Pupils: Pupils are equal, round, and reactive to light.     Comments: Pupils are equal round and reactive No lid lag Moist conjunctiva  Neck:     Thyroid: No thyromegaly.     Trachea: No tracheal tenderness.     Comments: No cervical lymphadenopathy Cardiovascular:     Rate and Rhythm: Normal rate and regular rhythm.     Heart sounds: No murmur heard. Pulmonary:     Effort: Pulmonary effort is normal.     Breath sounds: Normal breath sounds. No wheezing or rales.  Abdominal:     Tenderness: There is guarding. There is no rebound.     Hernia: No hernia is present.  Musculoskeletal:     Cervical back: Normal range of motion and neck supple.  Skin:    General: Skin is warm.     Findings: No rash.  Nails: There is no clubbing.     Comments: Normal skin turgor  Neurological:     Mental Status: She is alert and oriented to person, place, and time.     Comments: Normal gait and station  Psychiatric:        Mood and Affect: Mood normal.        Thought Content: Thought content  normal.        Judgment: Judgment normal.     Comments: Appropriate affect     Assessment/Plan: 63 year old female with sigmoid perforation History of vasculitis Morbid obesity  1.  Will proceed to the operating room urgently for ex lap and bowel resection with likely ostomy.  I discussed with the patient this may result in open abdomen to allow her to overcome her sepsis which likely would require further visits to the operating room. I discussed with the risk benefits of the procedure to include but not limited to: Effect, pain, damage surrounding structures, possible need for the surgery, possible ICU and prolonged ventilation, possible death.  Patient voiced understanding and wished to proceed.  Lynda Leos 10/07/2023, 3:13 PM

## 2023-10-07 NOTE — Progress Notes (Signed)
 eLink Physician-Brief Progress Note Patient Name: [name] DOB: [birth year] MRN: [id number]   Date of Service  10/07/2023  HPI/Events of Note  Magnesium  came back at 1.2  eICU Interventions  Ordered Mag sulfate 1 g IVPB Level to be repeated in AM     Intervention Category Intermediate Interventions: Electrolyte abnormality - evaluation and management  Damien ONEIDA Grout 10/07/2023, 11:22 PM

## 2023-10-07 NOTE — Transfer of Care (Signed)
 Immediate Anesthesia Transfer of Care Note  Patient: [name]  Procedure(s) Performed: LAPAROTOMY, EXPLORATORY EXCISION, SMALL INTESTINE  Patient Location: PACU  Anesthesia Type:General  Level of Consciousness: awake, alert , and oriented  Airway & Oxygen Therapy: Patient connected to nasal cannula oxygen  Post-op Assessment: Report given to RN and Post -op Vital signs reviewed and stable  Post vital signs: Reviewed and stable  Last Vitals:  Vitals Value Taken Time  BP 97/66 10/07/23 17:15  Temp 36.8 C 10/07/23 16:58  Pulse 103 10/07/23 17:38  Resp 21 10/07/23 17:38  SpO2 95 % 10/07/23 17:38  Vitals shown include unfiled device data.  Last Pain:  Vitals:   10/07/23 1505  TempSrc:   PainSc: 6          Complications: No notable events documented.

## 2023-10-07 NOTE — Progress Notes (Signed)
 Pharmacy Antibiotic Note  [name] is a 62 y.o. female for which pharmacy has been consulted for vancomycin and micafungin dosing for perforated bowel in the setting of immunocompromise. Patient going to OR for surgical intervention.  SCr 1.44 - AKI WBC 12.3; LA 1.9; T 100.7>98.2; HR 102; RR 17  Plan: Micafungin 150 mg q24h (Dosing for >115 kg) Zosyn  3.375g IV q8h (4 hour infusion) Vancomycin 2000 mg once, subsequent dosing as indicated per random vancomycin level until renal function stable and/or improved, at which time scheduled dosing can be considered Monitor WBC, fever, renal function, cultures De-escalate when able  Height: 5' 9 (175.3 cm) Weight: 117.9 kg (260 lb) IBW/kg (Calculated) : 66.2  Temp (24hrs), Avg:99.1 F (37.3 C), Min:97.5 F (36.4 C), Max:100.7 F (38.2 C)  Recent Labs  Lab 10/07/23 1132 10/07/23 1420  WBC 12.3*  --   CREATININE 1.44*  --   LATICACIDVEN  --  1.9    Estimated Creatinine Clearance: 55.6 mL/min (A) (by C-G formula based on SCr of 1.44 mg/dL (H)).    Allergies  Allergen Reactions   Guaifenesin Nausea And Vomiting and Other (See Comments)    un   Mucinex [Guaifenesin Er] Nausea And Vomiting   Sulfa Antibiotics Nausea And Vomiting   Microbiology results: Pending  Thank you for allowing pharmacy to be a part of this patient's care.  Dorn Buttner, PharmD, BCPS 10/07/2023 3:32 PM ED Clinical Pharmacist -  [phone]

## 2023-10-07 NOTE — Op Note (Signed)
 10/07/2023  4:40 PM  PATIENT:  [name]  63 y.o. female  PRE-OPERATIVE DIAGNOSIS:  bowel perforation  POST-OPERATIVE DIAGNOSIS: Sigmoid bowel perforation, with purulent and feculent peritonitis  PROCEDURE:  Procedure(s): LAPAROTOMY, EXPLORATORY (N/A) Sigmoid colon resection, Hartman's colostomy  SURGEON:  Surgeons and Role:    DEWAINE Rubin Calamity, MD - Primary  ASSISTANTS: none   ANESTHESIA:   general  EBL:  25cc   BLOOD ADMINISTERED:none  DRAINS: none   LOCAL MEDICATIONS USED:  NONE  SPECIMEN:  Source of Specimen: Sigmoid colon with perforation  DISPOSITION OF SPECIMEN:  PATHOLOGY  COUNTS:  YES  TOURNIQUET:  * No tourniquets in log *  DICTATION: .Dragon Dictation   Indication procedure: Patient is a 63 year old female with a history of constipation.  Patient was brought to the ER secondary to decreased p.o., decreased urinary output and decreased bowel movements.  Patient underwent CT scan.  Patient was found to have perforation of the sigmoid colon.  Patient was taken back to the operating room emergently for an exporter laparotomy.  Findings: Patient with large amount of feculent and purulent peritonitis.  There was a very dilated and thickened sigmoid colon.  There was perforation down in the pelvis of the sigmoid colon.  The sigmoid colon was removed and the staple line was tagged with 2-0 Prolene's x 2.  The descending colon was brought up via an ostomy site and brought out as an end colostomy.  Details of procedure: As the patient was consented patient taken back to the OR and placed in supine position with bilateral SCDs in place.  Patient underwent general endotracheal intubation.  Patient was then prepped draped standard fashion.  A timeout was called and all facts verified.  At this time midline incision was created using a #10 blade.  Dissection was taken down to the midline fascia and the linea alba using cautery to maintain hemostasis.  The  peritoneum was entered bluntly.  The fascia was then extended to length the skin incision.  At this time was evident there is large amount of purulent peritonitis.  The omentum was stuck down to the pelvis.  At this time I reached in and was able to break up any loculations.  Large amount of purulent peritonitis was expressed.  This was suctioned out.  I was able to mobilize the sigmoid colon.  At this time it was evident there was a perforation with stool within the abdominal cavity to the right portion of the pelvis.  At this time the loculations and attachments inferiorly to the left and right ovary and fallopian tube as well as uterus were broken up bluntly.  At this time I traced this back up to the descending colon.  The white line of Toldt was incised.  At this time I was able to make a mesenteric window in the portion of the normal descending colon.  75 GIA stapler was used to transect the descending colon.  A LigaSure was then used to transect the mesentery of the sigmoid colon.  This was taken down towards the peritoneal reflection.  I was able to place a contour stapler down to the rectum.  This was able to transect the distal portion of the rectosigmoid junction.  This was sent off to pathology.  At this time the area was irrigated out with multiple liters of sterile saline.  2-0 Prolene were then used to tack the staple line x 2.  At this time the descending colon was brought up via  an ostomy site to the left upper quadrant.  The midline fascia was reapproximate using #1 PDS single-stranded in a standard running fashion x 2.  The skin was left open.  The ostomy stable and was removed using cautery.  The ostomy was matured using 2 oh silks x 2 in interrupted Vicryl's.  This time the ostomy site was placed.  The midline fascia was packed with saline soaked Kerlix.  This was dressed with ABD pad and tape.  Patient Toller procedure well was taken to the recovery in guarded condition.  CASE DATA: Type  of patient?: DOW CASE (Surgical Hospitalist [facility] Inpatient) Status of Case? EMERGENT Add On Infection Present At Time Of Surgery (PATOS)?  FECULENT PERITONITIS   PLAN OF CARE: Admit to inpatient   PATIENT DISPOSITION:  PACU - guarded condition.   Delay start of Pharmacological VTE agent (>24hrs) due to surgical blood loss or risk of bleeding: yes

## 2023-10-07 NOTE — Sepsis Progress Note (Signed)
 Elink following code sepsis

## 2023-10-07 NOTE — ED Notes (Signed)
 EDP notified of hypotension. Ramirez at bedside and verbal to give NS bolus. Patient Aox4.

## 2023-10-08 ENCOUNTER — Encounter (HOSPITAL_COMMUNITY): Payer: Self-pay | Admitting: General Surgery

## 2023-10-08 ENCOUNTER — Other Ambulatory Visit: Payer: Self-pay

## 2023-10-08 DIAGNOSIS — N179 Acute kidney failure, unspecified: Secondary | ICD-10-CM

## 2023-10-08 DIAGNOSIS — E119 Type 2 diabetes mellitus without complications: Secondary | ICD-10-CM | POA: Diagnosis not present

## 2023-10-08 DIAGNOSIS — A419 Sepsis, unspecified organism: Secondary | ICD-10-CM | POA: Diagnosis not present

## 2023-10-08 DIAGNOSIS — K631 Perforation of intestine (nontraumatic): Secondary | ICD-10-CM | POA: Diagnosis not present

## 2023-10-08 DIAGNOSIS — J849 Interstitial pulmonary disease, unspecified: Secondary | ICD-10-CM | POA: Diagnosis not present

## 2023-10-08 LAB — BASIC METABOLIC PANEL WITH GFR
Anion gap: 8 (ref 5–15)
BUN: 18 mg/dL (ref 8–23)
CO2: 17 mmol/L — ABNORMAL LOW (ref 22–32)
Calcium: 7.4 mg/dL — ABNORMAL LOW (ref 8.9–10.3)
Chloride: 108 mmol/L (ref 98–111)
Creatinine, Ser: 1.3 mg/dL — ABNORMAL HIGH (ref 0.44–1.00)
GFR, Estimated: 46 mL/min — ABNORMAL LOW (ref >60)
Glucose, Bld: 149 mg/dL — ABNORMAL HIGH (ref 70–99)
Potassium: 4.1 mmol/L (ref 3.5–5.1)
Sodium: 133 mmol/L — ABNORMAL LOW (ref 135–145)

## 2023-10-08 LAB — GLUCOSE, CAPILLARY
Glucose-Capillary: 122 mg/dL — ABNORMAL HIGH (ref 70–99)
Glucose-Capillary: 141 mg/dL — ABNORMAL HIGH (ref 70–99)
Glucose-Capillary: 143 mg/dL — ABNORMAL HIGH (ref 70–99)
Glucose-Capillary: 136 mg/dL — ABNORMAL HIGH (ref 70–99)
Glucose-Capillary: 190 mg/dL — ABNORMAL HIGH (ref 70–99)
Glucose-Capillary: 199 mg/dL — ABNORMAL HIGH (ref 70–99)

## 2023-10-08 LAB — CBC
HCT: 39.8 % (ref 36.0–46.0)
Hemoglobin: 12.9 g/dL (ref 12.0–15.0)
MCH: 27.6 pg (ref 26.0–34.0)
MCHC: 32.4 g/dL (ref 30.0–36.0)
MCV: 85 fL (ref 80.0–100.0)
Platelets: 337 K/uL (ref 150–400)
RBC: 4.68 MIL/uL (ref 3.87–5.11)
RDW: 12.6 % (ref 11.5–15.5)
WBC: 19.8 K/uL — ABNORMAL HIGH (ref 4.0–10.5)
nRBC: 0 % (ref 0.0–0.2)

## 2023-10-08 LAB — VITAMIN B12: Vitamin B-12: 1883 pg/mL — ABNORMAL HIGH (ref 180–914)

## 2023-10-08 LAB — VITAMIN D 25 HYDROXY (VIT D DEFICIENCY, FRACTURES): Vit D, 25-Hydroxy: 58.64 ng/mL (ref 30–100)

## 2023-10-08 LAB — RETICULOCYTES
Immature Retic Fract: 9.4 % (ref 2.3–15.9)
RBC.: 4.4 MIL/uL (ref 3.87–5.11)
Retic Count, Absolute: 40.5 K/uL (ref 19.0–186.0)
Retic Ct Pct: 0.9 % (ref 0.4–3.1)

## 2023-10-08 LAB — URINE CULTURE: Culture: >=100000 — AB

## 2023-10-08 LAB — IRON AND TIBC
Iron: 12 ug/dL — ABNORMAL LOW (ref 28–170)
Saturation Ratios: 10 % — ABNORMAL LOW (ref 10.4–31.8)
TIBC: 120 ug/dL — ABNORMAL LOW (ref 250–450)
UIBC: 108 ug/dL

## 2023-10-08 LAB — FOLATE: Folate: 12 ng/mL (ref >5.9)

## 2023-10-08 LAB — PHOSPHORUS: Phosphorus: 3.6 mg/dL (ref 2.5–4.6)

## 2023-10-08 LAB — MAGNESIUM: Magnesium: 1.7 mg/dL (ref 1.7–2.4)

## 2023-10-08 LAB — FERRITIN: Ferritin: 597 ng/mL — ABNORMAL HIGH (ref 11–307)

## 2023-10-08 LAB — C-REACTIVE PROTEIN: CRP: 42.6 mg/dL — ABNORMAL HIGH (ref <1.0)

## 2023-10-08 LAB — LACTIC ACID, PLASMA: Lactic Acid, Venous: 0.8 mmol/L (ref 0.5–1.9)

## 2023-10-08 MED ORDER — THIAMINE HCL 100 MG/ML IJ SOLN
100.0000 mg | Freq: Once | INTRAMUSCULAR | Status: AC
  Administered 2023-10-08: 100 mg via INTRAVENOUS
  Filled 2023-10-08: qty 2

## 2023-10-08 MED ORDER — SODIUM CHLORIDE 0.9% FLUSH
10.0000 mL | INTRAVENOUS | Status: DC | PRN
  Administered 2023-10-14: 10 mL

## 2023-10-08 MED ORDER — SIMETHICONE 40 MG/0.6ML PO SUSP: 40.0000 mg | Freq: Four times a day (QID) | ORAL | Status: DC | PRN

## 2023-10-08 MED ORDER — ACETAMINOPHEN 10 MG/ML IV SOLN
1000.0000 mg | Freq: Four times a day (QID) | INTRAVENOUS | Status: DC
  Filled 2023-10-08: qty 100

## 2023-10-08 MED ORDER — THIAMINE HCL 100 MG/ML IJ SOLN
100.0000 mg | INTRAMUSCULAR | Status: AC
  Administered 2023-10-09 – 2023-10-13 (×5): 100 mg via INTRAVENOUS
  Filled 2023-10-08 (×5): qty 2

## 2023-10-08 MED ORDER — TRACE MINERALS CU-MN-SE-ZN 300-55-60-3000 MCG/ML IV SOLN
INTRAVENOUS | Status: AC
  Filled 2023-10-08: qty 373.13

## 2023-10-08 MED ORDER — ACETAMINOPHEN 10 MG/ML IV SOLN
1000.0000 mg | Freq: Four times a day (QID) | INTRAVENOUS | Status: AC
  Administered 2023-10-08 – 2023-10-09 (×3): 1000 mg via INTRAVENOUS
  Filled 2023-10-08 (×3): qty 100

## 2023-10-08 MED ORDER — SODIUM CHLORIDE 0.9% FLUSH
10.0000 mL | Freq: Two times a day (BID) | INTRAVENOUS | Status: DC
  Administered 2023-10-08: 10 mL
  Administered 2023-10-10: 30 mL
  Administered 2023-10-11 – 2023-10-15 (×8): 10 mL
  Administered 2023-10-15: 20 mL
  Administered 2023-10-16: 10 mL
  Administered 2023-10-16: 20 mL
  Administered 2023-10-17 (×2): 10 mL
  Administered 2023-10-18 – 2023-10-19 (×3): 20 mL
  Administered 2023-10-20: 10 mL

## 2023-10-08 MED ORDER — METHOCARBAMOL 500 MG PO TABS
500.0000 mg | ORAL_TABLET | Freq: Three times a day (TID) | ORAL | Status: DC
  Administered 2023-10-08 (×3): 500 mg
  Filled 2023-10-08 (×3): qty 1

## 2023-10-08 MED ORDER — OXYCODONE HCL 5 MG PO TABS
5.0000 mg | ORAL_TABLET | ORAL | Status: DC | PRN
  Administered 2023-10-08 – 2023-10-09 (×4): 10 mg
  Filled 2023-10-08 (×3): qty 2

## 2023-10-08 MED ORDER — PHENOL 1.4 % MT LIQD: 1.0000 | OROMUCOSAL | Status: DC | PRN

## 2023-10-08 MED ORDER — LACTATED RINGERS IV BOLUS
500.0000 mL | Freq: Once | INTRAVENOUS | Status: AC
  Administered 2023-10-08: 500 mL via INTRAVENOUS

## 2023-10-08 MED ORDER — LIDOCAINE 5 % EX PTCH
2.0000 | MEDICATED_PATCH | CUTANEOUS | Status: DC
  Administered 2023-10-08 – 2023-10-20 (×12): 2 via TRANSDERMAL
  Filled 2023-10-08 (×14): qty 2

## 2023-10-08 MED ORDER — MUPIROCIN 2 % EX OINT
TOPICAL_OINTMENT | Freq: Two times a day (BID) | CUTANEOUS | Status: DC
  Administered 2023-10-11 – 2023-10-19 (×5): 1 via NASAL
  Filled 2023-10-08 (×4): qty 22

## 2023-10-08 NOTE — Consult Note (Signed)
 WOC Nurse ostomy consult note Stoma type/location: RUQ; end colostomy  Stomal assessment/size: oval shaped, creasing, flush with skin, pink, moist  Peristomal assessment: NA Treatment options for stomal/peristomal skin: will add 2 barrier ring Output none Ostomy pouching: 2pc. In place, no output Education provided:  Discussed procedure with patient; she is angry with surgery and situation but pleasant, she is sleepy and still has NG/in the ICU.  Not contusive to education today  She did look at stoma, however she can not see actual stoma, I am fearful that when she is sitting up she will crease at stoma. We discussed opening and closing and need for convexity. Provided rationale for use of convexity; educational materials taken to the room; supplies ordered 4-1pc soft convex Gerlean # [id number]/barrier rings Lawson# [id number]/belt Gerlean 621 (1) ordered Dr. Rubin arrived to bedside to talk with patient; wants NPWT dressing placed starting tomorrow with pouch change. Due to proximity they will most likely need to be changed at the same time.  Enrolled patient in [employer] DC program: Yes NPWT machine and supplies ordered   WOC Nurse will follow along with you for continued support with ostomy teaching and care And for NPWT dressing placement and maintenance.  Alaze Garverick [facility] MSN, RN, [place], CNS, [employer] [phone]

## 2023-10-08 NOTE — Plan of Care (Signed)
  Problem: Education: Goal: Knowledge of General Education information will improve Description: Including pain rating scale, medication(s)/side effects and non-pharmacologic comfort measures Outcome: Progressing   Problem: Clinical Measurements: Goal: Ability to maintain clinical measurements within normal limits will improve Outcome: Progressing   Problem: Pain Managment: Goal: General experience of comfort will improve and/or be controlled Outcome: Progressing   Problem: Elimination: Goal: Will not experience complications related to bowel motility Outcome: Not Progressing

## 2023-10-08 NOTE — Progress Notes (Addendum)
 [facility] Progress Note  1 Day Post-Op  Subjective: CC:  Alert, cooperative. Reports significant abdominal pain as well as throat discomfort and thick phlegm that is hard to cough up.   Reports PMH of ?Ulcerative colitis as well as a R sided intestinal perforation in 2021 managed with drains   Tells me that prior to arrival she saw her PCP, Dr. Tobie, and been told she was very vitamin deficient and needed IV magnesium . She expresses stress/anxiety/depression from taking care of her mother with dementia, amongst other stressful live events.  On 9 mcg levo  Afebrile WBC 19.8  On Zosyn  Objective: Vital signs in last 24 hours: Temp:  [97.5 F (36.4 C)-100.7 F (38.2 C)] 97.8 F (36.6 C) (09/29 0335) Pulse Rate:  [82-136] 84 (09/29 0615) Resp:  [12-24] 12 (09/29 0615) BP: (67-119)/(47-75) 91/75 (09/29 0600) SpO2:  [89 %-100 %] 95 % (09/29 0615) Arterial Line BP: (69-130)/(51-86) 86/72 (09/29 0615) Weight:  [887 kg-117.9 kg] 115.9 kg (09/29 0500) Last BM Date :  (PTA)  Intake/Output from previous day: 09/28 0701 - 09/29 0700 In: 6776.7 [I.V.:2767.9; IV Piggyback:4008.8] Out: 576 [Urine:475; Stool:1] Intake/Output this shift: No intake/output data recorded.  PE: Gen:  Alert, NAD, cooperative and conversant Card:  Regular rate and rhythm, pedal pulses 2+ BL, no lower extremity edema  Pulm:  Normal effort Abd: Soft, approp tender, midline incisions c/d/I, ostomy retracted but stoma is pink/pale and viable, and no gas/stool in ostomy pouch GU: UOP 475 Skin: warm and dry, no rashes  Psych: A&Ox3   Lab Results:  Recent Labs    10/07/23 1801 10/08/23 0507  WBC 9.0 19.8*  HGB 13.0 12.9  HCT 39.4 39.8  PLT 284 337   BMET Recent Labs    10/07/23 1801 10/08/23 0507  NA 137 133*  K 3.2* 4.1  CL 107 108  CO2 18* 17*  GLUCOSE 130* 149*  BUN 18 18  CREATININE 1.37* 1.30*  CALCIUM  7.4* 7.4*   PT/INR No results for input(s): LABPROT, INR in the  last 72 hours. CMP     Component Value Date/Time   NA 133 (L) 10/08/2023 0507   NA 139 12/24/2017 1641   K 4.1 10/08/2023 0507   CL 108 10/08/2023 0507   CO2 17 (L) 10/08/2023 0507   GLUCOSE 149 (H) 10/08/2023 0507   BUN 18 10/08/2023 0507   BUN 15 12/24/2017 1641   CREATININE 1.30 (H) 10/08/2023 0507   CALCIUM  7.4 (L) 10/08/2023 0507   PROT 5.4 (L) 10/07/2023 1801   ALBUMIN  2.1 (L) 10/07/2023 1801   AST 14 (L) 10/07/2023 1801   ALT 10 10/07/2023 1801   ALKPHOS 58 10/07/2023 1801   BILITOT 2.5 (H) 10/07/2023 1801   GFRNONAA 46 (L) 10/08/2023 0507   GFRAA >60 06/30/2019 0221   Lipase     Component Value Date/Time   LIPASE 14 10/07/2023 1132       Studies/Results: DG Chest 1 View Result Date: 10/07/2023 CLINICAL DATA:  pre op EXAM: CHEST  1 VIEW COMPARISON:  July 15, 2019 FINDINGS: The cardiomediastinal silhouette is unchanged in contour. No pleural effusion. No pneumothorax. No acute pleuroparenchymal abnormality. IMPRESSION: No acute cardiopulmonary abnormality. Electronically Signed   By: Corean Salter M.D.   On: 10/07/2023 14:27   CT L-SPINE NO CHARGE Result Date: 10/07/2023 EXAM: CT OF THE LUMBAR SPINE WITH CONTRAST 10/07/2023 01:12:32 PM TECHNIQUE: CT of the lumbar spine was performed with the administration of 75 mL of iohexol  (OMNIPAQUE ) 350 MG/ML  intravenous contrast. Multiplanar reformatted images are provided for review. Automated exposure control, iterative reconstruction, and/or weight based adjustment of the mA/kV was utilized to reduce the radiation dose to as low as reasonably achievable. COMPARISON: MR lumbar spine 04/09/2022. CLINICAL HISTORY: Back pain for 3 days. FINDINGS: BONES AND ALIGNMENT: Normal vertebral body heights. No acute fracture or suspicious bone lesion. Normal alignment. DEGENERATIVE CHANGES: Mild multilevel endplate degenerative changes. Posterior disc bulge/herniation is noted at L4-5 and L5-S1 SOFT TISSUES: No acute abnormality. IMPRESSION:  1. No acute fracture or suspicious bone lesion. 2. Posterior disc bulge/herniation at L4-5 and L5-S1. Electronically signed by: Waddell Calk MD 10/07/2023 01:55 PM EDT RP Workstation: [id number]   CT ABDOMEN PELVIS W CONTRAST Result Date: 10/07/2023 EXAM: CT ABDOMEN AND PELVIS WITH CONTRAST 10/07/2023 01:12:32 PM TECHNIQUE: CT of the abdomen and pelvis was performed with the administration of 75 mL of iohexol  (OMNIPAQUE ) 350 MG/ML injection. Multiplanar reformatted images are provided for review. Automated exposure control, iterative reconstruction, and/or weight-based adjustment of the mA/kV was utilized to reduce the radiation dose to as low as reasonably achievable. COMPARISON: None available. CLINICAL HISTORY: Abdominal pain, acute, nonlocalized. FINDINGS: LOWER CHEST: No acute abnormality. LIVER: The liver is unremarkable. GALLBLADDER AND BILE DUCTS: Sludge ball versus noncalcified stone identified within the gallbladder measuring 1.9 cm, image 36/3. No biliary ductal dilatation. SPLEEN: No acute abnormality. PANCREAS: No acute abnormality. ADRENAL GLANDS: No acute abnormality. KIDNEYS, URETERS AND BLADDER: Wall thickening involving the bladder with surrounding soft tissue stranding likely reflects secondary inflammation to changes described below. No stones in the kidneys or ureters. No hydronephrosis. No perinephric or periureteral stranding. GI AND BOWEL: Stomach is normal. No pathologic dilatation of the bowel loops. There is a moderate amount of desiccated stool which distends the lumen of the sigmoid colon. There is marked wall thickening involving the sigmoid colon with severe surrounding inflammatory fat stranding and fluid. Signs of colonic perforation identified with stool exiting the bowel and extending into the small bowel mesentery along with multiple small foci of extraluminal gas axial image 71/3. Associated inflammatory changes extend throughout the lower abdominal and pelvic . Small amount  of early fluid loculation between the anterior wall of the sigmoid colon and posterior wall of bladder measuring 2.7 x 2.0 cm, image 86/3. PERITONEUM AND RETROPERITONEUM: Diffuse inflammatory changes within the abdomen and pelvis. VASCULATURE: Aortic atherosclerosis. LYMPH NODES: Mildly enlarged lower abdominal and pelvic lymph nodes are likely reactive. REPRODUCTIVE ORGANS: No acute abnormality. BONES AND SOFT TISSUES: No acute osseous abnormality. IMPRESSION: 1. Marked sigmoid colitis with perforation, evidenced by extraluminal gas and stool extending into the small bowel mesentery, with extensive inflammatory changes throughout the lower abdominal and pelvic mesentery and peritoneal thickening. 2. Small early fluid loculation (2.7 x 2.0 cm) between the anterior sigmoid colon and posterior bladder wall. 3. Gallbladder sludge ball versus noncalcified stone (1.9 cm). 4. Aortic atherosclerotic calcifications 5. Critical results were called to the clinical service at the time of interpretation at 10/07/2023 - 1:51 pm. I spoke with John K. Lang and delivered these results. Electronically signed by: Waddell Calk MD 10/07/2023 01:52 PM EDT RP Workstation: [id number]    Anti-infectives: Anti-infectives (From admission, onward)    Start     Dose/Rate Route Frequency Ordered Stop   10/07/23 2200  piperacillin -tazobactam (ZOSYN ) IVPB 3.375 g       Placed in Followed by Linked Group   3.375 g 12.5 mL/hr over 240 Minutes Intravenous Every 8 hours 10/07/23 1351     10/07/23 1732  vancomycin variable dose per unstable renal function (pharmacist dosing)         Does not apply See admin instructions 10/07/23 1732     10/07/23 1545  micafungin (MYCAMINE) 150 mg in sodium chloride  0.9 % 100 mL IVPB        150 mg 107.5 mL/hr over 1 Hours Intravenous Every 24 hours 10/07/23 1530     10/07/23 1530  metroNIDAZOLE  (FLAGYL ) IVPB 500 mg  Status:  Discontinued        500 mg 100 mL/hr over 60 Minutes Intravenous  Every 12 hours 10/07/23 1518 10/07/23 1532   10/07/23 1530  vancomycin (VANCOREADY) IVPB 2000 mg/400 mL        2,000 mg 200 mL/hr over 120 Minutes Intravenous  Once 10/07/23 1526 10/07/23 2033   10/07/23 1400  metroNIDAZOLE  (FLAGYL ) IVPB 500 mg        500 mg 100 mL/hr over 60 Minutes Intravenous  Once 10/07/23 1346 10/07/23 1507   10/07/23 1400  piperacillin -tazobactam (ZOSYN ) IVPB 3.375 g       Placed in Followed by Linked Group   3.375 g 100 mL/hr over 30 Minutes Intravenous  Once 10/07/23 1351 10/07/23 1502        Assessment/Plan  Sigmoid bowel perforation with feculent peritonitis, likely due to stercoral colitis POD#1 s/p  LAPAROTOMY, EXPLORATORY, Sigmoid colon resection, Hartman's colostomy 9.28 Dr. Rubin - continue NGT to LIWs and await bowel function - anticipate post-operative abscess, will likely need a post-op CT scan in 5-7 days. - appreciate CCM mgmt septic shock  FEN: IVF per primary, NG to LIWS; given prolonged NPO status will start PICC/TPN today; ice chips for comfort and sips with necessary PO meds is ok.  ID: Zosyn  VTE: SCD's, ok for chemical VTE ppx from a surgical standpoint Foley: in place Dispo: ICU, appreciate CCM mgmt septic shock and multiple medical problems    LOS: 1 day   I reviewed nursing notes, hospitalist notes, last 24 h vitals and pain scores, last 48 h intake and output, last 24 h labs and trends, and last 24 h imaging results.  This care required moderate level of medical decision making.   Almarie Pringle, PA-C [facility] Please see Amion for pager number during day hours 7:00am-4:30pm

## 2023-10-08 NOTE — Anesthesia Postprocedure Evaluation (Signed)
 Anesthesia Post Note  Patient: [name]  Procedure(s) Performed: LAPAROTOMY, EXPLORATORY EXCISION, SMALL INTESTINE     Patient location during evaluation: PACU Anesthesia Type: General Level of consciousness: awake and alert Pain management: pain level controlled Vital Signs Assessment: post-procedure vital signs reviewed and stable Respiratory status: spontaneous breathing, nonlabored ventilation, respiratory function stable and patient connected to nasal cannula oxygen Cardiovascular status: blood pressure returned to baseline and stable Postop Assessment: no apparent nausea or vomiting Anesthetic complications: no   No notable events documented.          Lynwood MARLA Cornea

## 2023-10-08 NOTE — Progress Notes (Signed)
 NAME:  [name], MRN:  [id number], DOB:  03/17/1960, LOS: 1 ADMISSION DATE:  10/07/2023, CONSULTATION DATE:  10/07/2023 REFERRING MD: ED, CHIEF COMPLAINT: Sigmoid perforation  History of Present Illness:  [name] is a 63 year old female with extensive past medical history significant for but not limited to ANCA associated vasculitis, previous diffuse pulmonary alveolar hemorrhage, chronic immunosuppression type 2 diabetes, HTN, HLD, ILD, previous sigmoid colon perforation, anxiety who presented to the ED via EMS with left sided abdominal pain that began 7 days prior to admission with now associated radiation to back.  Also reports decreased oral intake.  On ED arrival patient was found mildly hypothermic but later developed mild fever of 100.7, tachypneic, tachycardic, and hypotensive.  Lab work significant for K3.2, glucose 148, creatinine 1.44 with GFR 41, albumin  2.9, WBC 12.3.  CT abdomen pelvis was obtained which revealed marked sigmoid colitis with perforation evidenced by extraluminal gas and stool extending into the small bowel mesentery with extensive inflammation and early fluid collection.  Given concern for evolving sepsis PCCM consulted for further management and admission, general surgery planning urgent for surgical evaluation  Pertinent  Medical History  ANCA associated vasculitis, previous diffuse pulmonary alveolar hemorrhage, chronic immunosuppression type 2 diabetes, HTN, HLD, ILD, previous sigmoid colon perforation, anxiety  Significant Hospital Events: Including procedures, antibiotic start and stop dates in addition to other pertinent events   9/28 presented for complaints of abdominal pain with nausea and vomiting, CT confirms sigmoid perforation.  Early signs of sepsis on admission, PCCM consulted for admission 9/28 exploratory laparotomy for sigmoid bowel perforation with feculent peritonitis   Antibiotics Zosyn  9/28>> Vancomycin 9/28.  Okay okay Micafungin  9/28>> Metronidazole  9/28>> Interim History / Subjective:  Did not sleep well last night Having pain and discomfort Still requiring pressors  Objective    Blood pressure 91/75, pulse 84, temperature 97.8 F (36.6 C), temperature source Oral, resp. rate 12, height 5' 9 (1.753 m), weight 115.9 kg, SpO2 95%.        Intake/Output Summary (Last 24 hours) at 10/08/2023 0757 Last data filed at 10/08/2023 0600 Gross per 24 hour  Intake 6776.69 ml  Output 576 ml  Net 6200.69 ml   Filed Weights   10/07/23 1121 10/07/23 1715 10/08/23 0500  Weight: 117.9 kg 112 kg 115.9 kg    Examination: General: Acutely ill-appearing, awake alert interactive HEENT: Moist oral mucosa Neuro: Nonfocal exam CV: S1-S2 appreciated PULM: Clear breath sounds anteriorly and bilaterally, decreased at the bases GI: Soft, postsurgical, tender Extremities: warm/dry, no edema  Skin: no rashes or lesions  I reviewed last 24 h vitals and pain scores, last 48 h intake and output, last 24 h labs and trends, and last 24 h imaging results. BUN 18, creatinine 1.30  Resolved problem list   Assessment and Plan   Sepsis/sigmoid perforation - S/p exploratory laparotomy with resection -On IV Zosyn , vancomycin, antifungal given immunosuppression -CellCept /methotrexate  on hold -Continue pressors with goal MAP greater than 65-currently Levophed at 8 mcg/min -Continue to monitor urine output - Will hold vancomycin  ANCA associated vasculitis/ILD Past history of diffuse pulmonary alveolar hemorrhage Chronic immunosuppression - CellCept  and methotrexate  on hold -Continue current antibiotic therapy -Pulmonary hygiene -Aspiration precautions  Type 2 diabetes - Continue SSI - Goal CBG 140-180 - CBG checks  Nonischemic cardiomyopathy Last echo with EF 60-65 with normal right ventricular function Coronary artery disease Hypertension Hyperlipidemia - Patient on pressors at present, hold home beta-blocker and  Entresto  given hypotension - Close clinical  monitoring  History of anxiety - On Xanax  and Wellbutrin  at home - Will resume as Xanax   AKI -Continue monitoring -avoid nephrotoxic medications - Maintain renal perfusion  Labs   CBC: Recent Labs  Lab 10/07/23 1132 10/07/23 1541 10/07/23 1801 10/08/23 0507  WBC 12.3*  --  9.0 19.8*  NEUTROABS 11.1*  --   --   --   HGB 13.5 11.9* 13.0 12.9  HCT 41.2 35.0* 39.4 39.8  MCV 86.7  --  85.1 85.0  PLT 267  --  284 337    Basic Metabolic Panel: Recent Labs  Lab 10/07/23 1132 10/07/23 1541 10/07/23 1801 10/07/23 2125 10/08/23 0507  NA 136 139 137  --  133*  K 3.2* 3.2* 3.2*  --  4.1  CL 102  --  107  --  108  CO2 22  --  18*  --  17*  GLUCOSE 148*  --  130*  --  149*  BUN 18  --  18  --  18  CREATININE 1.44*  --  1.37*  --  1.30*  CALCIUM  8.5*  --  7.4*  --  7.4*  MG  --   --   --  1.2* 1.7  PHOS  --   --   --   --  3.6   GFR: Estimated Creatinine Clearance: 61 mL/min (A) (by C-G formula based on SCr of 1.3 mg/dL (H)). Recent Labs  Lab 10/07/23 1132 10/07/23 1420 10/07/23 1801 10/08/23 0507  WBC 12.3*  --  9.0 19.8*  LATICACIDVEN  --  1.9 1.0 0.8    Liver Function Tests: Recent Labs  Lab 10/07/23 1132 10/07/23 1801  AST 15 14*  ALT 10 10  ALKPHOS 79 58  BILITOT 1.9* 2.5*  PROT 6.9 5.4*  ALBUMIN  2.9* 2.1*   Recent Labs  Lab 10/07/23 1132  LIPASE 14   No results for input(s): AMMONIA in the last 168 hours.  ABG    Component Value Date/Time   PHART 7.288 (L) 10/07/2023 1541   PCO2ART 41.4 10/07/2023 1541   PO2ART 326 (H) 10/07/2023 1541   HCO3 19.8 (L) 10/07/2023 1541   TCO2 21 (L) 10/07/2023 1541   ACIDBASEDEF 6.0 (H) 10/07/2023 1541   O2SAT 100 10/07/2023 1541     Coagulation Profile: No results for input(s): INR, PROTIME in the last 168 hours.  Cardiac Enzymes: No results for input(s): CKTOTAL, CKMB, CKMBINDEX, TROPONINI in the last 168 hours.  HbA1C: Hgb A1c MFr Bld   Date/Time Value Ref Range Status  10/07/2023 04:00 PM 4.6 (L) 4.8 - 5.6 % Final    Comment:    (NOTE) Diagnosis of Diabetes The following HbA1c ranges recommended by the American Diabetes Association (ADA) may be used as an aid in the diagnosis of diabetes mellitus.  Hemoglobin             Suggested A1C NGSP%              Diagnosis  <5.7                   Non Diabetic  5.7-6.4                Pre-Diabetic  >6.4                   Diabetic  <7.0                   Glycemic control for  adults with diabetes.    05/30/2019 09:00 AM 7.0 (H) 4.8 - 5.6 % Final    Comment:    (NOTE) Pre diabetes:          5.7%-6.4% Diabetes:              >6.4% Glycemic control for   <7.0% adults with diabetes     CBG: Recent Labs  Lab 10/07/23 1735 10/07/23 1924 10/07/23 2328 10/08/23 0338 10/08/23 0729  GLUCAP 126* 127* 104* 122* 141*    Review of Systems:   Please see the history of present illness. All other systems reviewed and are negative    Past Medical History:  She,  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), Diverticulitis of large intestine with perforation (09/23/2012), Dyspnea (05/11/2014), GERD (gastroesophageal reflux disease), Hemoptysis (06/23/2013), History of endometriosis (1997), tobacco use, presenting hazards to health (09/23/2012), Hyperglycemia (07/24/2013), Hypertension, Hypokalemia (07/20/2013), ILD (interstitial lung disease) (HCC) (01/22/2014), Leukocytosis (05/11/2014), MPA (microscopic polyangiitis) (HCC)  (2015), Numbness, Obesity (BMI 30-39.9) (09/23/2012), Paresthesia (12/26/2016), Perforation of sigmoid colon - stercoral (09/27/2012), Physical deconditioning (04/30/2015), Pulmonary alveolar hemorrhage (09/03/2013), Respiratory failure with hypoxia (HCC) (06/23/2013), and Right leg swelling (12/26/2016).   Surgical History:   Past Surgical History:  Procedure Laterality Date   APPENDECTOMY     BLADDER REPAIR     DILATATION & CURETTAGE/HYSTEROSCOPY WITH MYOSURE N/A 06/03/2019   Procedure: DILATATION & CURETTAGE/HYSTEROSCOPY WITH MYOSURE;  Surgeon: Curlene Agent, MD;  Location: [facility] OR;  Service: Gynecology;  Laterality: N/A;   IR CATHETER TUBE CHANGE  06/24/2019   IR FLUORO GUIDE CV LINE RIGHT  06/16/2019   IR RADIOLOGIST EVAL & MGMT  07/29/2019   IR US  GUIDE VASC ACCESS RIGHT  06/16/2019   LAPAROSCOPY  1997   dx of endometriosis   LAPAROSCOPY  04/28/2000    Laparoscopy with lysis of adhesions, hysteroscopy, D&C.   TONSILLECTOMY       Social History:   reports that she quit smoking about 22 years ago. Her smoking use included cigarettes. She started smoking about 49 years ago. She has a 27 pack-year smoking history. She has never used smokeless tobacco. She reports that she does not drink alcohol and does not use drugs.   Family History:  Her family history includes Birth defects in her cousin; Breast cancer in her paternal grandmother; CAD in an other family member; Diabetes in her father and mother; Hypertension in her father; Obesity in her father.   Allergies Allergies  Allergen Reactions   Guaifenesin Nausea And Vomiting and Other (See Comments)    un   Mucinex [Guaifenesin Er] Nausea And Vomiting   Sulfa Antibiotics Nausea And Vomiting    The patient is critically ill with multiple organ systems failure and requires high complexity decision making for assessment and support, frequent evaluation and titration of therapies, application of advanced monitoring technologies and extensive  interpretation of multiple databases. Critical Care Time devoted to patient care services described in this note independent of APP/resident time (if applicable)  is 33 minutes.   Jennet Epley MD [facility] Personal pager: See Amion If unanswered, please page CCM On-call: #[phone]

## 2023-10-08 NOTE — Progress Notes (Signed)
 Peripherally Inserted Central Catheter Placement  The IV Nurse has discussed with the patient and/or persons authorized to consent for the patient, the purpose of this procedure and the potential benefits and risks involved with this procedure.  The benefits include less needle sticks, lab draws from the catheter, and the patient may be discharged home with the catheter. Risks include, but not limited to, infection, bleeding, blood clot (thrombus formation), and puncture of an artery; nerve damage and irregular heartbeat and possibility to perform a PICC exchange if needed/ordered by physician.  Alternatives to this procedure were also discussed.  Bard Power PICC patient education guide, fact sheet on infection prevention and patient information card has been provided to patient /or left at bedside.  PICC placed by Leita Shipper, RN   PICC Placement Documentation  PICC Triple Lumen 10/08/23 Right Brachial 44 cm 2 cm (Active)  Indication for Insertion or Continuance of Line Administration of hyperosmolar/irritating solutions (i.e. TPN, Vancomycin, etc.) 10/08/23 1653  Exposed Catheter (cm) 2 cm 10/08/23 1653  Site Assessment Clean, Dry, Intact 10/08/23 1653  Lumen #1 Status Flushed;Saline locked;Blood return noted 10/08/23 1653  Lumen #2 Status Flushed;Saline locked;Blood return noted 10/08/23 1653  Lumen #3 Status Flushed;Saline locked;Blood return noted 10/08/23 1653  Dressing Type Transparent;Securing device 10/08/23 1653  Dressing Status Antimicrobial disc/dressing in place;Clean, Dry, Intact 10/08/23 1653  Line Care Connections checked and tightened 10/08/23 1653  Line Adjustment (NICU/IV Team Only) No 10/08/23 1653  Dressing Intervention New dressing;Adhesive placed at insertion site (IV team only) 10/08/23 1653  Dressing Change Due 10/15/23 10/08/23 1653       Nivia Gervase, Cherene Place 10/08/2023, 4:54 PM

## 2023-10-08 NOTE — TOC CM/SW Note (Signed)
 Transition of Care [facility]) - Inpatient Brief Assessment   Patient Details  Name: [name] MRN: [id number] Date of Birth: 01-19-1960  Transition of Care [facility]) CM/SW Contact:    Tom-Johnson, Willam Munford Daphne, RN Phone Number: 10/08/2023, 4:32 PM   Clinical Narrative:  Patient presented to the ED with Lt-sided Abdominal pain that radiates to her Back.  CT Abdomen showed Sigmoid Perforation. Patient's Labs concerning for Sepsis. Patient has hx of Sigmoid Colon Perforation, Pulmonary Alveolar hemorrhage, ANCA associated Vasculitis, T2DM,  HTN, HLD, ILD, Anxiety. General sx consulted, underwent Ex-Lap, Sigmoid Colon resection, Hartman's Colostomy on 10/07/23. Continues on NGT to LIWS, awaiting Bowel function. On IV abx, Levo, Fentanyl . WOC following for Ostomy/Wound care.   CM spoke with patient at bedside about needs for post hospital transition. Patient states she lives with her husband who is a Naval architect and on the road all the time. States she assists her sisters care for their mother who has Dementia and stays there most of the time. Patient voiced concern about her new Ostomy and how she will care for her mother. CM assured patient that it's doable but will need assistance from her sister's when discharged home. Ostomy supplies will be arranged for when patient is Medically ready for discharge.      Patient not Medically ready for discharge.  CM will continue to follow as patient progresses with care towards discharge.         Transition of Care Asessment: Insurance and Status: Insurance coverage has been reviewed Patient has primary care physician: Yes Home environment has been reviewed: Yes Prior level of function:: Independent Prior/Current Home Services: No current home services Social Drivers of Health Review: SDOH reviewed no interventions necessary Readmission risk has been reviewed: Yes Transition of care needs: transition of care needs identified, TOC will continue  to follow

## 2023-10-08 NOTE — Plan of Care (Signed)
  Problem: Education: Goal: Ability to describe self-care measures that may prevent or decrease complications (Diabetes Survival Skills Education) will improve Outcome: Progressing Goal: Individualized Educational Video(s) Outcome: Progressing   Problem: Coping: Goal: Ability to adjust to condition or change in health will improve Outcome: Progressing   Problem: Health Behavior/Discharge Planning: Goal: Ability to identify and utilize available resources and services will improve Outcome: Progressing Goal: Ability to manage health-related needs will improve Outcome: Progressing   Problem: Metabolic: Goal: Ability to maintain appropriate glucose levels will improve Outcome: Progressing   Problem: Nutritional: Goal: Maintenance of adequate nutrition will improve Outcome: Progressing Goal: Progress toward achieving an optimal weight will improve Outcome: Progressing   Problem: Skin Integrity: Goal: Risk for impaired skin integrity will decrease Outcome: Progressing   Problem: Tissue Perfusion: Goal: Adequacy of tissue perfusion will improve Outcome: Progressing   Problem: Education: Goal: Knowledge of General Education information will improve Description: Including pain rating scale, medication(s)/side effects and non-pharmacologic comfort measures Outcome: Progressing   Problem: Health Behavior/Discharge Planning: Goal: Ability to manage health-related needs will improve Outcome: Progressing   Problem: Clinical Measurements: Goal: Ability to maintain clinical measurements within normal limits will improve Outcome: Progressing Goal: Will remain free from infection Outcome: Progressing Goal: Diagnostic test results will improve Outcome: Progressing Goal: Respiratory complications will improve Outcome: Progressing   Problem: Activity: Goal: Risk for activity intolerance will decrease Outcome: Progressing   Problem: Elimination: Goal: Will not experience  complications related to urinary retention Outcome: Progressing   Problem: Pain Managment: Goal: General experience of comfort will improve and/or be controlled Outcome: Progressing   Problem: Safety: Goal: Ability to remain free from injury will improve Outcome: Progressing   Problem: Skin Integrity: Goal: Risk for impaired skin integrity will decrease Outcome: Progressing   Problem: Fluid Volume: Goal: Ability to maintain a balanced intake and output will improve Outcome: Not Progressing   Problem: Clinical Measurements: Goal: Cardiovascular complication will be avoided Outcome: Not Progressing   Problem: Nutrition: Goal: Adequate nutrition will be maintained Outcome: Not Progressing   Problem: Coping: Goal: Level of anxiety will decrease Outcome: Not Progressing   Problem: Elimination: Goal: Will not experience complications related to bowel motility Outcome: Not Progressing

## 2023-10-08 NOTE — Progress Notes (Addendum)
 PHARMACY - TOTAL PARENTERAL NUTRITION CONSULT NOTE   Indication: Prolonged ileus  Patient Measurements: Height: 5' 9 (175.3 cm) Weight: 115.9 kg (255 lb 8.2 oz) IBW/kg (Calculated) : 66.2 TPN AdjBW (KG): 79.1 Body mass index is 37.73 kg/m.   Assessment: 37 YOF admitted w/ HPI significant for left sided abdominal pain that began 7 days PTA with associated decreased oral intake, found to have perforated sigmoid colon who is s/p emergent exlap w/ resection / Hartmann's colostomy. She requires TPN for anticipated post-op ileus and prolonged inability to obtain nutrition via oral route.   Glucose / Insulin : HgbA1c 4.6 / CBG's 104-149 iSS 6 u in 24 hours. Received 10 mg dexamethasone  [9/28 intra-op] Electrolytes: Na 133, K 4.1, Ch 108, CO2 17, Ca 7.4 [CoCa 8.92], Phos 3.6, Mag 1.7  Renal: Scr 1.30 / BUN 18 Hepatic: AST 14 / ALT/alk Phos wnl / Tbili 2.5 / alb 2.1, TG 159 [09/30/2012] Intake / Output; MIVF: UOP 0.17 ml/kg/hr, 100 ml NG, 1+ colostomy, LBM PTA GI Imaging: 9/28 CT abd shows sigmoid colitis w/ perf, extraluminal gas + stool extending into small bowel mesentery, extensive inflammation changes throughout lower abd + pelvic mesentery + peritoneal thickening GI Surgeries / Procedures:  9/28 Exlap w/ resection and Hartmann's colostomy  Central access: 9/28 TPN start date: 9/29  Nutritional Goals: Goal TPN rate is 75 mL/hr (provides 105 g of protein and 2018 kcals per day)  RD Assessment: Estimated Needs Total Energy Estimated Needs: 1850-2050 Total Protein Estimated Needs: 95-110g Total Fluid Estimated Needs: >/=1.8L  Current Nutrition:  NPO and TPN  Plan:  Start TPN at 40 mL/hr at 1800 which will deliver 56 g protein and 1076 Kcals Electrolytes in TPN: Na 120 mEq/L, K 100 mEq/L, Ca 10 mEq/L, Mg 10 mEq/L, and Phos 25 mmol/L. Cl:Ac 1:1 Add standard MVI and trace elements to TPN Mag 2 g iv x1 Thiamine 100 mg iv q24h x5 days to help prevent refeeding syndrome Initiate  Moderate q4h SSI and adjust as needed  Monitor TPN labs on Mon/Thurs, prn TG AM labs    Saiya Crist BS, PharmD, BCPS Clinical Pharmacist 10/08/2023 10:11 AM  Contact: [phone] after 3 PM

## 2023-10-08 NOTE — Progress Notes (Signed)
 Pharmacy Antibiotic Note  [name] is a 63 y.o. female for which pharmacy has been consulted for vancomycin and micafungin dosing for perforated bowel in the setting of immunocompromise.  S/p ex-lap with with purulent and feculent peritonitis. D/w Dr Neda, ok to dc vanc since likely will not need for abd infection/coverage.   Scr 1.3   Plan: Continue micafungin 150mg  IV q24 Continue zosyn  3.375g IV q8 Dc vanc  Height: 5' 9 (175.3 cm) Weight: 115.9 kg (255 lb 8.2 oz) IBW/kg (Calculated) : 66.2  Temp (24hrs), Avg:98.1 F (36.7 C), Min:96 F (35.6 C), Max:100.7 F (38.2 C)  Recent Labs  Lab 10/07/23 1132 10/07/23 1420 10/07/23 1801 10/08/23 0507  WBC 12.3*  --  9.0 19.8*  CREATININE 1.44*  --  1.37* 1.30*  LATICACIDVEN  --  1.9 1.0 0.8    Estimated Creatinine Clearance: 61 mL/min (A) (by C-G formula based on SCr of 1.3 mg/dL (H)).    Allergies  Allergen Reactions   Guaifenesin Nausea And Vomiting and Other (See Comments)    un   Mucinex [Guaifenesin Er] Nausea And Vomiting   Sulfa Antibiotics Nausea And Vomiting   9/28 vanc x1 9/28 zosyn >> 9/28 flagyl  x1 9/28 micafungin>>   MRSA+ 9/28 blood>> 9/28 urine>>  Sergio Batch, PharmD, [place], AAHIVP, CPP Infectious Disease Pharmacist 10/08/2023 8:25 AM

## 2023-10-08 NOTE — Progress Notes (Signed)
 eLink Physician-Brief Progress Note Patient Name: [name] DOB: 13-Mar-1960 MRN: [id number]   Date of Service  10/08/2023  HPI/Events of Note  Patient requesting home Xanax  She appears quite lethargic but does communicate that she wants to take it at night She has Fentanyl  and oxycodone  around the clock  eICU Interventions  Will hold off on giving Xanax  this morning and bedside rounding team to reassess if she can receive it tonight for sleep Discussed with patient and BSRN and both are in agreement     Intervention Category Minor Interventions: Agitation / anxiety - evaluation and management  Damien ONEIDA Grout 10/08/2023, 1:25 AM

## 2023-10-08 NOTE — Progress Notes (Signed)
 Initial Nutrition Assessment  DOCUMENTATION CODES:  Obesity unspecified  INTERVENTION:  TPN initiation; monitor for return of bowel function  Monitor magnesium , potassium, and phosphorus daily for at least 3 days, MD to replete as needed, as pt is at risk for refeeding syndrome given pt report of inadequate oral intake x 7 days. Add Thiamine 100 mg daily for 7 days  Recommend MVI with minerals daily  Check Vitamin labs: anemia panel, Vitamin A, Vitamin D, Vitamin B12, Vitamin C, zinc, CRP  NUTRITION DIAGNOSIS:  Inadequate oral intake related to acute illness, altered GI function (sigmoid bowel perforation, s/p exlap and resection) as evidenced by NPO status.  GOAL:  Patient will meet greater than or equal to 90% of their needs  MONITOR:  Diet advancement, Labs, Weight trends, I & O's  REASON FOR ASSESSMENT:  Consult New TPN/TNA  ASSESSMENT:  Pt admitted with c/o left sided abdominal pain that began 7 days PTA with associated decreased oral intake, found to have perforated sigmoid colon. PMH significant for ANCA associated vasculitis, previous diffuse pulmonary alveolar hemorrhage, chronic immunosuppression type 2 diabetes, HTN, HLD, ILD, previous sigmoid colon perforation, anxiety   9/28: admitted with sigmoid perforation with feculent peritonitis, early signs of sepsis; s/p emergent exlap with resection, hartmann's colostomy 9/29: TPN to be initiated  Surgery requesting TPN initiation in anticipation for prolonged NPO status post-operatively. Ice chips for comfort. NGT continues to LIS. Anticipate post-operative abscess.   Spoke with patient at bedside. She is concerned with many things such as not being able to care for her mom with dementia, her new colostomy, hunger and incisional pain.   She states that she has had no oral intake within the last week d/t nausea and vomiting. At baseline, she reports eating very little via small snacks throughout the day. Pt has been on  ozempic over the last 3 years which has helped her better control her HgbA1c. She states that she has been experiencing early satiety and limited appetite since starting however.   Pt recalls that her PCP checked her vitamin labs about 3-4 months ago. She reports that she had been deficient in iron, Vitamin A, Vitamin B12 and magnesium . Pt has been receiving IV iron infusion. She also takes a MVI at home. Pt is amenable to rechecking labs to ensure adequate repletion complete and ongoing vitamin needs.   Since her last bowel perforation in 2021, she reports that she has lost about 135 lbs.  Review of documented weight history reflects that over the last 4 years her weight has declined from 174.4 kg (2021) down to 115.9 kg (today). Within the last 6 months, she appears to have had a weight loss of 5.4% which is not clinically significant for time frame.   Drains/lines: Left radial a-line LLQ colostomy Left abdominal drain NGT to suction  Medications: SSI 0-15 units q4h, thiamine Drips: Micafungin Levo @ 7 mcg/min abx  Labs:  Sodium 133 Cr 1.30 GFR 46 CBG's 104-141 x24 hours HgbA1c 4.6%  NUTRITION - FOCUSED PHYSICAL EXAM: Flowsheet Row Most Recent Value  Orbital Region No depletion  Upper Arm Region No depletion  Thoracic and Lumbar Region No depletion  Buccal Region No depletion  Temple Region Mild depletion  Clavicle Bone Region No depletion  Clavicle and Acromion Bone Region No depletion  Scapular Bone Region No depletion  Dorsal Hand No depletion  Patellar Region No depletion  Anterior Thigh Region No depletion  Posterior Calf Region No depletion  Edema (RD Assessment) Mild  [generalized]  Hair Reviewed  Eyes Reviewed  Mouth Other (Comment)  [poor dentition]  Skin Reviewed  Nails Reviewed    Diet Order:   Diet Order             Diet NPO time specified  Diet effective now                   EDUCATION NEEDS:  Education needs have been addressed  Skin:   Skin Assessment: Reviewed RN Assessment (closed abdominal incision)  Last BM:  PTA  Height:  Ht Readings from Last 1 Encounters:  10/07/23 5' 9 (1.753 m)    Weight:  Wt Readings from Last 1 Encounters:  10/08/23 115.9 kg    Ideal Body Weight:  65.9 kg  BMI:  Body mass index is 37.73 kg/m.  Estimated Nutritional Needs:   Kcal:  1850-2050  Protein:  95-110g  Fluid:  >/=1.8L  Allie Jiles Goya, RDN, LDN Clinical Nutrition See AMiON for contact information.

## 2023-10-09 LAB — CBC
HCT: 34.3 % — ABNORMAL LOW (ref 36.0–46.0)
Hemoglobin: 11.4 g/dL — ABNORMAL LOW (ref 12.0–15.0)
MCH: 28.6 pg (ref 26.0–34.0)
MCHC: 33.2 g/dL (ref 30.0–36.0)
MCV: 86 fL (ref 80.0–100.0)
Platelets: 322 K/uL (ref 150–400)
RBC: 3.99 MIL/uL (ref 3.87–5.11)
RDW: 13 % (ref 11.5–15.5)
WBC: 26.3 K/uL — ABNORMAL HIGH (ref 4.0–10.5)
nRBC: 0 % (ref 0.0–0.2)

## 2023-10-09 LAB — COMPREHENSIVE METABOLIC PANEL WITH GFR
ALT: 9 U/L (ref 0–44)
AST: 12 U/L — ABNORMAL LOW (ref 15–41)
Albumin: 1.9 g/dL — ABNORMAL LOW (ref 3.5–5.0)
Alkaline Phosphatase: 49 U/L (ref 38–126)
Anion gap: 8 (ref 5–15)
BUN: 22 mg/dL (ref 8–23)
CO2: 21 mmol/L — ABNORMAL LOW (ref 22–32)
Calcium: 8 mg/dL — ABNORMAL LOW (ref 8.9–10.3)
Chloride: 105 mmol/L (ref 98–111)
Creatinine, Ser: 1.21 mg/dL — ABNORMAL HIGH (ref 0.44–1.00)
GFR, Estimated: 51 mL/min — ABNORMAL LOW (ref >60)
Glucose, Bld: 229 mg/dL — ABNORMAL HIGH (ref 70–99)
Potassium: 4.3 mmol/L (ref 3.5–5.1)
Sodium: 134 mmol/L — ABNORMAL LOW (ref 135–145)
Total Bilirubin: 0.8 mg/dL (ref 0.0–1.2)
Total Protein: 5.3 g/dL — ABNORMAL LOW (ref 6.5–8.1)

## 2023-10-09 LAB — GLUCOSE, CAPILLARY
Glucose-Capillary: 151 mg/dL — ABNORMAL HIGH (ref 70–99)
Glucose-Capillary: 159 mg/dL — ABNORMAL HIGH (ref 70–99)
Glucose-Capillary: 163 mg/dL — ABNORMAL HIGH (ref 70–99)
Glucose-Capillary: 172 mg/dL — ABNORMAL HIGH (ref 70–99)
Glucose-Capillary: 186 mg/dL — ABNORMAL HIGH (ref 70–99)
Glucose-Capillary: 188 mg/dL — ABNORMAL HIGH (ref 70–99)

## 2023-10-09 LAB — TRIGLYCERIDES: Triglycerides: 340 mg/dL — ABNORMAL HIGH (ref <150)

## 2023-10-09 LAB — MAGNESIUM: Magnesium: 1.9 mg/dL (ref 1.7–2.4)

## 2023-10-09 LAB — SURGICAL PATHOLOGY

## 2023-10-09 LAB — PHOSPHORUS: Phosphorus: 2.9 mg/dL (ref 2.5–4.6)

## 2023-10-09 MED ORDER — OXYCODONE HCL 5 MG PO TABS: 5.0000 mg | ORAL_TABLET | ORAL | Status: DC | PRN

## 2023-10-09 MED ORDER — DOCUSATE SODIUM 50 MG/5ML PO LIQD
100.0000 mg | Freq: Two times a day (BID) | ORAL | Status: DC | PRN
  Administered 2023-10-10: 100 mg via ORAL

## 2023-10-09 MED ORDER — BUPROPION HCL 75 MG PO TABS
150.0000 mg | ORAL_TABLET | Freq: Every day | ORAL | Status: DC
  Administered 2023-10-09: 150 mg
  Filled 2023-10-09: qty 2

## 2023-10-09 MED ORDER — UMECLIDINIUM BROMIDE 62.5 MCG/ACT IN AEPB
1.0000 | INHALATION_SPRAY | Freq: Every day | RESPIRATORY_TRACT | Status: DC
  Administered 2023-10-09 – 2023-10-20 (×10): 1 via RESPIRATORY_TRACT
  Filled 2023-10-09 (×2): qty 7

## 2023-10-09 MED ORDER — HEPARIN SODIUM (PORCINE) 5000 UNIT/ML IJ SOLN
5000.0000 [IU] | Freq: Three times a day (TID) | INTRAMUSCULAR | Status: DC
Start: 2023-10-09 — End: 2023-10-20
  Administered 2023-10-09 – 2023-10-20 (×34): 5000 [IU] via SUBCUTANEOUS
  Filled 2023-10-09 (×35): qty 1

## 2023-10-09 MED ORDER — MAGNESIUM SULFATE 2 GM/50ML IV SOLN
2.0000 g | Freq: Once | INTRAVENOUS | Status: AC
  Administered 2023-10-09: 2 g via INTRAVENOUS
  Filled 2023-10-09: qty 50

## 2023-10-09 MED ORDER — OXYCODONE HCL 5 MG PO TABS
5.0000 mg | ORAL_TABLET | Freq: Four times a day (QID) | ORAL | Status: DC | PRN
  Administered 2023-10-09 – 2023-10-10 (×2): 5 mg via ORAL
  Filled 2023-10-09 (×2): qty 1

## 2023-10-09 MED ORDER — TRACE MINERALS CU-MN-SE-ZN 300-55-60-3000 MCG/ML IV SOLN
INTRAVENOUS | Status: AC
  Filled 2023-10-09: qty 559.67

## 2023-10-09 MED ORDER — METHOCARBAMOL 500 MG PO TABS: 750.0000 mg | ORAL_TABLET | Freq: Four times a day (QID) | ORAL | Status: DC

## 2023-10-09 MED ORDER — PREGABALIN 100 MG PO CAPS
100.0000 mg | ORAL_CAPSULE | Freq: Two times a day (BID) | ORAL | Status: DC
  Administered 2023-10-09: 100 mg
  Filled 2023-10-09: qty 1

## 2023-10-09 MED ORDER — BUPROPION HCL 100 MG PO TABS: 300.0000 mg | ORAL_TABLET | Freq: Every day | ORAL | Status: DC

## 2023-10-09 MED ORDER — OXYCODONE HCL 5 MG PO TABS: 5.0000 mg | ORAL_TABLET | Freq: Four times a day (QID) | ORAL | Status: DC | PRN

## 2023-10-09 MED ORDER — BUPROPION HCL 75 MG PO TABS
150.0000 mg | ORAL_TABLET | Freq: Every day | ORAL | Status: DC
  Filled 2023-10-09: qty 2

## 2023-10-09 MED ORDER — BUPROPION HCL ER (XL) 300 MG PO TB24
450.0000 mg | ORAL_TABLET | Freq: Every day | ORAL | Status: DC
  Administered 2023-10-10 – 2023-10-20 (×10): 450 mg via ORAL
  Filled 2023-10-09 (×12): qty 1

## 2023-10-09 MED ORDER — OXYCODONE HCL 5 MG PO TABS
5.0000 mg | ORAL_TABLET | ORAL | Status: DC | PRN
  Administered 2023-10-09 (×2): 10 mg via ORAL
  Filled 2023-10-09 (×2): qty 2

## 2023-10-09 MED ORDER — BUPROPION HCL ER (XL) 300 MG PO TB24: 450.0000 mg | ORAL_TABLET | Freq: Every day | ORAL | Status: DC

## 2023-10-09 MED ORDER — POLYETHYLENE GLYCOL 3350 17 G PO PACK
17.0000 g | PACK | Freq: Every day | ORAL | Status: DC
  Administered 2023-10-09 – 2023-10-19 (×8): 17 g via ORAL
  Filled 2023-10-09 (×11): qty 1

## 2023-10-09 MED ORDER — PREGABALIN 100 MG PO CAPS: 100.0000 mg | ORAL_CAPSULE | Freq: Two times a day (BID) | ORAL | Status: DC

## 2023-10-09 MED ORDER — METHOCARBAMOL 500 MG PO TABS
750.0000 mg | ORAL_TABLET | Freq: Four times a day (QID) | ORAL | Status: DC
  Filled 2023-10-09: qty 2

## 2023-10-09 MED ORDER — POLYETHYLENE GLYCOL 3350 17 G PO PACK: 17.0000 g | PACK | Freq: Every day | ORAL | Status: DC | PRN

## 2023-10-09 MED ORDER — FLUOXETINE HCL 20 MG/5ML PO SOLN
40.0000 mg | Freq: Every day | ORAL | Status: DC
  Administered 2023-10-09: 40 mg
  Filled 2023-10-09 (×2): qty 10

## 2023-10-09 MED ORDER — BUPROPION HCL ER (XL) 300 MG PO TB24
300.0000 mg | ORAL_TABLET | Freq: Once | ORAL | Status: AC
  Administered 2023-10-09: 300 mg via ORAL
  Filled 2023-10-09: qty 1

## 2023-10-09 MED ORDER — ORAL CARE MOUTH RINSE: 15.0000 mL | OROMUCOSAL | Status: DC | PRN

## 2023-10-09 MED ORDER — PREGABALIN 25 MG PO CAPS
50.0000 mg | ORAL_CAPSULE | Freq: Two times a day (BID) | ORAL | Status: DC
  Administered 2023-10-09 – 2023-10-20 (×21): 50 mg via ORAL
  Filled 2023-10-09 (×22): qty 2

## 2023-10-09 MED ORDER — ALPRAZOLAM 0.5 MG PO TABS: 0.5000 mg | ORAL_TABLET | Freq: Three times a day (TID) | ORAL | Status: DC | PRN

## 2023-10-09 MED ORDER — ALPRAZOLAM 0.5 MG PO TABS
0.5000 mg | ORAL_TABLET | Freq: Every evening | ORAL | Status: DC | PRN
  Administered 2023-10-09 – 2023-10-10 (×2): 0.5 mg via ORAL
  Filled 2023-10-09 (×3): qty 1

## 2023-10-09 MED ORDER — BOOST / RESOURCE BREEZE PO LIQD CUSTOM
1.0000 | Freq: Three times a day (TID) | ORAL | Status: DC
Start: 2023-10-09 — End: 2023-10-15
  Administered 2023-10-09 – 2023-10-14 (×11): 1 via ORAL
  Administered 2023-10-14: 237 mL via ORAL
  Administered 2023-10-14 – 2023-10-15 (×2): 1 via ORAL

## 2023-10-09 MED ORDER — FENTANYL CITRATE PF 50 MCG/ML IJ SOSY
25.0000 ug | PREFILLED_SYRINGE | INTRAMUSCULAR | Status: DC | PRN
  Administered 2023-10-09 – 2023-10-12 (×7): 25 ug via INTRAVENOUS
  Filled 2023-10-09 (×7): qty 1

## 2023-10-09 NOTE — Progress Notes (Addendum)
 [facility] Progress Note  2 Days Post-Op  Subjective: CC:  Alert, cooperative. Reports pain in her abdomen worse with movement.  NG low output. No gas/stool in ostomy appliance. Asking about her home meds.   Afebrile WBC 26.3 from 19.8 On Zosyn  Objective: Vital signs in last 24 hours: Temp:  [96.9 F (36.1 C)-98.3 F (36.8 C)] 98.3 F (36.8 C) (09/30 0743) Pulse Rate:  [67-91] 82 (09/30 0830) Resp:  [7-26] 21 (09/30 0830) BP: (63-150)/(26-102) 105/60 (09/30 0830) SpO2:  [92 %-100 %] 96 % (09/30 0830) Arterial Line BP: (78-91)/(70-86) 78/70 (09/29 1045) Weight:  [115.1 kg] 115.1 kg (09/30 0344) Last BM Date :  (PTA)  Intake/Output from previous day: 09/29 0701 - 09/30 0700 In: 1968.6 [I.V.:802.7; NG/GT:10; IV Piggyback:1155.9] Out: 935 [Urine:935] Intake/Output this shift: Total I/O In: 52.6 [I.V.:33.2; IV Piggyback:19.3] Out: 100 [Urine:100]  PE: Gen:  Alert, NAD, cooperative and conversant Card:  Regular rate and rhythm, pedal pulses 2+ BL, no lower extremity edema  Pulm:  Normal effort Abd: Soft, approp tender, midline incisions c/d/I see below, ostomy retracted but stoma is pale but viable, no gas/stool in ostomy pouch  GU: UOP 935 Skin: warm and dry, no rashes  Psych: A&Ox3   Lab Results:  Recent Labs    10/08/23 0507 10/09/23 0915  WBC 19.8* 26.3*  HGB 12.9 11.4*  HCT 39.8 34.3*  PLT 337 322   BMET Recent Labs    10/08/23 0507 10/09/23 0506  NA 133* 134*  K 4.1 4.3  CL 108 105  CO2 17* 21*  GLUCOSE 149* 229*  BUN 18 22  CREATININE 1.30* 1.21*  CALCIUM  7.4* 8.0*   PT/INR No results for input(s): LABPROT, INR in the last 72 hours. CMP     Component Value Date/Time   NA 134 (L) 10/09/2023 0506   NA 139 12/24/2017 1641   K 4.3 10/09/2023 0506   CL 105 10/09/2023 0506   CO2 21 (L) 10/09/2023 0506   GLUCOSE 229 (H) 10/09/2023 0506   BUN 22 10/09/2023 0506   BUN 15 12/24/2017 1641   CREATININE 1.21 (H) 10/09/2023 0506    CALCIUM  8.0 (L) 10/09/2023 0506   PROT 5.3 (L) 10/09/2023 0506   ALBUMIN  1.9 (L) 10/09/2023 0506   AST 12 (L) 10/09/2023 0506   ALT 9 10/09/2023 0506   ALKPHOS 49 10/09/2023 0506   BILITOT 0.8 10/09/2023 0506   GFRNONAA 51 (L) 10/09/2023 0506   GFRAA >60 06/30/2019 0221   Lipase     Component Value Date/Time   LIPASE 14 10/07/2023 1132       Studies/Results: US  EKG SITE RITE Result Date: 10/08/2023 If Site Rite image not attached, placement could not be confirmed due to current cardiac rhythm.  DG Chest 1 View Result Date: 10/07/2023 CLINICAL DATA:  pre op EXAM: CHEST  1 VIEW COMPARISON:  July 15, 2019 FINDINGS: The cardiomediastinal silhouette is unchanged in contour. No pleural effusion. No pneumothorax. No acute pleuroparenchymal abnormality. IMPRESSION: No acute cardiopulmonary abnormality. Electronically Signed   By: Corean Salter M.D.   On: 10/07/2023 14:27   CT L-SPINE NO CHARGE Result Date: 10/07/2023 EXAM: CT OF THE LUMBAR SPINE WITH CONTRAST 10/07/2023 01:12:32 PM TECHNIQUE: CT of the lumbar spine was performed with the administration of 75 mL of iohexol  (OMNIPAQUE ) 350 MG/ML intravenous contrast. Multiplanar reformatted images are provided for review. Automated exposure control, iterative reconstruction, and/or weight based adjustment of the mA/kV was utilized to reduce the radiation dose to as low  as reasonably achievable. COMPARISON: MR lumbar spine 04/09/2022. CLINICAL HISTORY: Back pain for 3 days. FINDINGS: BONES AND ALIGNMENT: Normal vertebral body heights. No acute fracture or suspicious bone lesion. Normal alignment. DEGENERATIVE CHANGES: Mild multilevel endplate degenerative changes. Posterior disc bulge/herniation is noted at L4-5 and L5-S1 SOFT TISSUES: No acute abnormality. IMPRESSION: 1. No acute fracture or suspicious bone lesion. 2. Posterior disc bulge/herniation at L4-5 and L5-S1. Electronically signed by: Waddell Calk MD 10/07/2023 01:55 PM EDT RP  Workstation: [id number]   CT ABDOMEN PELVIS W CONTRAST Result Date: 10/07/2023 EXAM: CT ABDOMEN AND PELVIS WITH CONTRAST 10/07/2023 01:12:32 PM TECHNIQUE: CT of the abdomen and pelvis was performed with the administration of 75 mL of iohexol  (OMNIPAQUE ) 350 MG/ML injection. Multiplanar reformatted images are provided for review. Automated exposure control, iterative reconstruction, and/or weight-based adjustment of the mA/kV was utilized to reduce the radiation dose to as low as reasonably achievable. COMPARISON: None available. CLINICAL HISTORY: Abdominal pain, acute, nonlocalized. FINDINGS: LOWER CHEST: No acute abnormality. LIVER: The liver is unremarkable. GALLBLADDER AND BILE DUCTS: Sludge ball versus noncalcified stone identified within the gallbladder measuring 1.9 cm, image 36/3. No biliary ductal dilatation. SPLEEN: No acute abnormality. PANCREAS: No acute abnormality. ADRENAL GLANDS: No acute abnormality. KIDNEYS, URETERS AND BLADDER: Wall thickening involving the bladder with surrounding soft tissue stranding likely reflects secondary inflammation to changes described below. No stones in the kidneys or ureters. No hydronephrosis. No perinephric or periureteral stranding. GI AND BOWEL: Stomach is normal. No pathologic dilatation of the bowel loops. There is a moderate amount of desiccated stool which distends the lumen of the sigmoid colon. There is marked wall thickening involving the sigmoid colon with severe surrounding inflammatory fat stranding and fluid. Signs of colonic perforation identified with stool exiting the bowel and extending into the small bowel mesentery along with multiple small foci of extraluminal gas axial image 71/3. Associated inflammatory changes extend throughout the lower abdominal and pelvic . Small amount of early fluid loculation between the anterior wall of the sigmoid colon and posterior wall of bladder measuring 2.7 x 2.0 cm, image 86/3. PERITONEUM AND RETROPERITONEUM:  Diffuse inflammatory changes within the abdomen and pelvis. VASCULATURE: Aortic atherosclerosis. LYMPH NODES: Mildly enlarged lower abdominal and pelvic lymph nodes are likely reactive. REPRODUCTIVE ORGANS: No acute abnormality. BONES AND SOFT TISSUES: No acute osseous abnormality. IMPRESSION: 1. Marked sigmoid colitis with perforation, evidenced by extraluminal gas and stool extending into the small bowel mesentery, with extensive inflammatory changes throughout the lower abdominal and pelvic mesentery and peritoneal thickening. 2. Small early fluid loculation (2.7 x 2.0 cm) between the anterior sigmoid colon and posterior bladder wall. 3. Gallbladder sludge ball versus noncalcified stone (1.9 cm). 4. Aortic atherosclerotic calcifications 5. Critical results were called to the clinical service at the time of interpretation at 10/07/2023 - 1:51 pm. I spoke with John K. Lang and delivered these results. Electronically signed by: Waddell Calk MD 10/07/2023 01:52 PM EDT RP Workstation: [id number]    Anti-infectives: Anti-infectives (From admission, onward)    Start     Dose/Rate Route Frequency Ordered Stop   10/07/23 2200  piperacillin -tazobactam (ZOSYN ) IVPB 3.375 g       Placed in Followed by Linked Group   3.375 g 12.5 mL/hr over 240 Minutes Intravenous Every 8 hours 10/07/23 1351     10/07/23 1732  vancomycin variable dose per unstable renal function (pharmacist dosing)  Status:  Discontinued         Does not apply See admin instructions 10/07/23 1732 10/08/23  0825   10/07/23 1545  micafungin (MYCAMINE) 150 mg in sodium chloride  0.9 % 100 mL IVPB        150 mg 107.5 mL/hr over 1 Hours Intravenous Every 24 hours 10/07/23 1530     10/07/23 1530  metroNIDAZOLE  (FLAGYL ) IVPB 500 mg  Status:  Discontinued        500 mg 100 mL/hr over 60 Minutes Intravenous Every 12 hours 10/07/23 1518 10/07/23 1532   10/07/23 1530  vancomycin (VANCOREADY) IVPB 2000 mg/400 mL        2,000 mg 200 mL/hr over  120 Minutes Intravenous  Once 10/07/23 1526 10/07/23 2033   10/07/23 1400  metroNIDAZOLE  (FLAGYL ) IVPB 500 mg        500 mg 100 mL/hr over 60 Minutes Intravenous  Once 10/07/23 1346 10/07/23 1507   10/07/23 1400  piperacillin -tazobactam (ZOSYN ) IVPB 3.375 g       Placed in Followed by Linked Group   3.375 g 100 mL/hr over 30 Minutes Intravenous  Once 10/07/23 1351 10/07/23 1502        Assessment/Plan  Sigmoid bowel perforation with feculent peritonitis, likely due to stercoral colitis vs diverticulitis POD#2 s/p  LAPAROTOMY, EXPLORATORY, Sigmoid colon resection, Hartman's colostomy 9.28 Dr. Rubin - NG is low output. 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 Nephrologist: DOROTHA Blanch   LOS: 2 days   I reviewed nursing notes, hospitalist notes, last 24 h vitals and pain scores, last 48 h intake and output, last 24 h labs and trends, and last 24 h imaging results.  This care required moderate level of medical decision making.   Almarie Pringle, PA-C [facility] Please see Amion for pager number during day hours 7:00am-4:30pm

## 2023-10-09 NOTE — Progress Notes (Signed)
 NAME:  [name], MRN:  [id number], DOB:  [birth year], LOS: 2 ADMISSION DATE:  10/07/2023, CONSULTATION DATE:  10/07/2023 REFERRING MD: ED, CHIEF COMPLAINT: Sigmoid perforation  History of Present Illness:  [name] is a 63 year old female with extensive past medical history significant for but not limited to ANCA associated vasculitis, previous diffuse pulmonary alveolar hemorrhage, chronic immunosuppression type 2 diabetes, HTN, HLD, ILD, previous sigmoid colon perforation, anxiety who presented to the ED via EMS with left sided abdominal pain that began 7 days prior to admission with now associated radiation to back.  Also reports decreased oral intake.  On ED arrival patient was found mildly hypothermic but later developed mild fever of 100.7, tachypneic, tachycardic, and hypotensive.  Lab work significant for K3.2, glucose 148, creatinine 1.44 with GFR 41, albumin  2.9, WBC 12.3.  CT abdomen pelvis was obtained which revealed marked sigmoid colitis with perforation evidenced by extraluminal gas and stool extending into the small bowel mesentery with extensive inflammation and early fluid collection.  Given concern for evolving sepsis PCCM consulted for further management and admission, general surgery planning urgent for surgical evaluation  Pertinent  Medical History  ANCA associated vasculitis, previous diffuse pulmonary alveolar hemorrhage, chronic immunosuppression type 2 diabetes, HTN, HLD, ILD, previous sigmoid colon perforation, anxiety  Significant Hospital Events: Including procedures, antibiotic start and stop dates in addition to other pertinent events   9/28 presented for complaints of abdominal pain with nausea and vomiting, CT confirms sigmoid perforation.  Early signs of sepsis on admission, PCCM consulted for admission 9/28 exploratory laparotomy for sigmoid bowel perforation with feculent peritonitis 9/29 still requiring pressors   Antibiotics Zosyn   9/28>> Vancomycin 9/28 Micafungin 9/28 Metronidazole  9/28>>  Interim History / Subjective:  NE down to 6 mcg Afebrile Pt hungry ready for clears today, no nausea, CCS d/c NGT starting clears  Objective    Blood pressure (!) 87/60, pulse 73, temperature 98.3 F (36.8 C), temperature source Oral, resp. rate (!) 9, height 5' 9 (1.753 m), weight 115.1 kg, SpO2 96%.        Intake/Output Summary (Last 24 hours) at 10/09/2023 0813 Last data filed at 10/09/2023 9373 Gross per 24 hour  Intake 1922.34 ml  Output 800 ml  Net 1122.34 ml   Filed Weights   10/07/23 1715 10/08/23 0500 10/09/23 0344  Weight: 112 kg 115.9 kg 115.1 kg    Examination: General:  Adult female sitting in bed in NAD HEENT: MM pink/moist, NGT- dk green bilious Neuro: AO, appropriate, MAE, mildly anxious CV: rr, NSR, no murmur, RUE PICC PULM:  non labored, clear anteriorly, occ dry cough GI: obese, midline incisional dressing cdi, ostomy- pink, +bs Extremities: warm/dry, no LE edema  Skin: no rashes  Afebrile  UOP 93ml/ 24hrs NGT 12ml/ 12hrs  +1L Net +7.2  Labs> Na 134, K 4.3, bicarb 17> 21, sCr 1.3> 1.2, pending CBC  Resolved problem list   Assessment and Plan   Septic shock  secondary to sigmoid bowel perforation w/ feculent peritonitis - S/p exploratory laparotomy with resection 9/28 P:  - per CCS  - imaging per CCS> anticipated post op abscess, CT in 4-6 days - CCS> d/c OGT and starting clear liquids - dressing changes per CCS - OOB - TPN per pharmacy, empiric thiamine  - NE for MAP goal > 65 - follow cultures, cont zosyn  and metronidazole  - ok to start empiric VTE ppx - multi-modal pain management with bowel regimen, try and minimize narcotics as able  ANCA associated vasculitis/ILD Past history of diffuse pulmonary alveolar hemorrhage Chronic immunosuppression Chronic pain - cont to hold CellCept  and methotrexate  while on pressors - abx as above - incruse - pulm hygiene- IS/  flutter, reports intolerance to guaifenesin  - resume home lyrica   Type 2 diabetes - CBG q4/ prn mSSI> will discuss TPN insulin  to adjust - goal CBG 140-180   Nonischemic cardiomyopathy TTE 10/23 EF 60-65%, normal right ventricular function Coronary artery disease Hypertension Hyperlipidemia P:  - NPO - shock limits GDMT, holding home BB and entresto    History of anxiety - resume reduced dose Xanax  and Wellbutrin     AKI - stable UOP and slowly improving sCr - d/c foley - remains net +7.2L - trend renal indices  - strict I/Os, daily wts - avoid nephrotoxins, renal dose meds, hemodynamic support as above   Labs   CBC: Recent Labs  Lab 10/07/23 1132 10/07/23 1541 10/07/23 1801 10/08/23 0507  WBC 12.3*  --  9.0 19.8*  NEUTROABS 11.1*  --   --   --   HGB 13.5 11.9* 13.0 12.9  HCT 41.2 35.0* 39.4 39.8  MCV 86.7  --  85.1 85.0  PLT 267  --  284 337    Basic Metabolic Panel: Recent Labs  Lab 10/07/23 1132 10/07/23 1541 10/07/23 1801 10/07/23 2125 10/08/23 0507 10/09/23 0506  NA 136 139 137  --  133* 134*  K 3.2* 3.2* 3.2*  --  4.1 4.3  CL 102  --  107  --  108 105  CO2 22  --  18*  --  17* 21*  GLUCOSE 148*  --  130*  --  149* 229*  BUN 18  --  18  --  18 22  CREATININE 1.44*  --  1.37*  --  1.30* 1.21*  CALCIUM  8.5*  --  7.4*  --  7.4* 8.0*  MG  --   --   --  1.2* 1.7 1.9  PHOS  --   --   --   --  3.6 2.9   GFR: Estimated Creatinine Clearance: 65.3 mL/min (A) (by C-G formula based on SCr of 1.21 mg/dL (H)). Recent Labs  Lab 10/07/23 1132 10/07/23 1420 10/07/23 1801 10/08/23 0507  WBC 12.3*  --  9.0 19.8*  LATICACIDVEN  --  1.9 1.0 0.8    Liver Function Tests: Recent Labs  Lab 10/07/23 1132 10/07/23 1801 10/09/23 0506  AST 15 14* 12*  ALT 10 10 9   ALKPHOS 79 58 49  BILITOT 1.9* 2.5* 0.8  PROT 6.9 5.4* 5.3*  ALBUMIN  2.9* 2.1* 1.9*   Recent Labs  Lab 10/07/23 1132  LIPASE 14   No results for input(s): AMMONIA in the last 168  hours.  ABG    Component Value Date/Time   PHART 7.288 (L) 10/07/2023 1541   PCO2ART 41.4 10/07/2023 1541   PO2ART 326 (H) 10/07/2023 1541   HCO3 19.8 (L) 10/07/2023 1541   TCO2 21 (L) 10/07/2023 1541   ACIDBASEDEF 6.0 (H) 10/07/2023 1541   O2SAT 100 10/07/2023 1541     Coagulation Profile: No results for input(s): INR, PROTIME in the last 168 hours.  Cardiac Enzymes: No results for input(s): CKTOTAL, CKMB, CKMBINDEX, TROPONINI in the last 168 hours.  HbA1C: Hgb A1c MFr Bld  Date/Time Value Ref Range Status  10/07/2023 04:00 PM 4.6 (L) 4.8 - 5.6 % Final    Comment:    (NOTE) Diagnosis of Diabetes The following HbA1c ranges recommended by  the American Diabetes Association (ADA) may be used as an aid in the diagnosis of diabetes mellitus.  Hemoglobin             Suggested A1C NGSP%              Diagnosis  <5.7                   Non Diabetic  5.7-6.4                Pre-Diabetic  >6.4                   Diabetic  <7.0                   Glycemic control for                       adults with diabetes.    05/30/2019 09:00 AM 7.0 (H) 4.8 - 5.6 % Final    Comment:    (NOTE) Pre diabetes:          5.7%-6.4% Diabetes:              >6.4% Glycemic control for   <7.0% adults with diabetes     CBG: Recent Labs  Lab 10/08/23 1520 10/08/23 1936 10/08/23 2305 10/09/23 0348 10/09/23 0738  GLUCAP 136* 190* 199* 186* 163*   Allergies Allergies  Allergen Reactions   Guaifenesin Nausea And Vomiting and Other (See Comments)    un   Mucinex [Guaifenesin Er] Nausea And Vomiting   Sulfa Antibiotics Nausea And Vomiting    CRITICAL CARE Performed by: Lyle Pesa   Total critical care time: 35 minutes  Critical care time was exclusive of separately billable procedures and treating other patients.  Critical care was necessary to treat or prevent imminent or life-threatening deterioration.  Critical care was time spent personally by me on the following  activities: development of treatment plan with patient and/or surrogate as well as nursing, discussions with consultants, evaluation of patient's response to treatment, examination of patient, obtaining history from patient or surrogate, ordering and performing treatments and interventions, ordering and review of laboratory studies, ordering and review of radiographic studies, pulse oximetry and re-evaluation of patient's condition.    Lyle Pesa, NP [facility] 10/09/2023, 8:13 AM  See Amion for pager If no response to pager , please call [phone] until 7pm After 7:00 pm call Elink  [phone]

## 2023-10-09 NOTE — Progress Notes (Signed)
 PHARMACY - TOTAL PARENTERAL NUTRITION CONSULT NOTE   Indication: Prolonged ileus  Patient Measurements: Height: 5' 9 (175.3 cm) Weight: 115.1 kg (253 lb 12 oz) IBW/kg (Calculated) : 66.2 TPN AdjBW (KG): 79.1 Body mass index is 37.47 kg/m.   Assessment: 37 YOF admitted w/ HPI significant for left sided abdominal pain that began 7 days PTA with associated decreased oral intake, found to have perforated sigmoid colon who is s/p emergent exlap w/ resection / Hartmann's colostomy. She requires TPN for anticipated post-op ileus and prolonged inability to obtain nutrition via oral route.   Glucose / Insulin : HgbA1c 4.6 / CBG's 136-199 iSS 15 u in 24 hours. Received 10 mg dexamethasone  [9/28 intra-op] Goal CBG's: 140-180 Electrolytes: Na 134, K 4.3, Ch 105, CO2 21, Ca 8.0 [CoCa 9.68], Phos 2.9, Mag 1.9 Renal: Scr 1.21 / BUN 22 Hepatic: AST 12 / ALT/alk Phos wnl / Tbili 2.5 / alb 1.9, TG 340 Intake / Output; MIVF: UOP 0.34 ml/kg/hr, 0 ml NG, 0 ml colostomy, LBM PTA GI Imaging: 9/28 CT abd shows sigmoid colitis w/ perf, extraluminal gas + stool extending into small bowel mesentery, extensive inflammation changes throughout lower abd + pelvic mesentery + peritoneal thickening GI Surgeries / Procedures:  9/28 Exlap w/ resection and Hartmann's colostomy  Central access: 9/28 TPN start date: 9/29  Nutritional Goals: Goal TPN rate is 75 mL/hr (provides 105 g of protein and 2018 kcals per day)  RD Assessment: Estimated Needs Total Energy Estimated Needs: 1850-2050 Total Protein Estimated Needs: 95-110g Total Fluid Estimated Needs: >/=1.8L  Current Nutrition:  NPO and TPN  Plan:  Increase TPN to 60 mL/hr at 1800 which will deliver 84 g protein and 1615 Kcals Electrolytes in TPN: Na 80 mEq/L, K 70 mEq/L, Ca 6.7 mEq/L, Mg 8 mEq/L, and Phos 20 mmol/L. Cl:Ac 1:1 Add standard MVI and trace elements to TPN Add 10 units regular insulin  to TPN Mag 2 g iv x1 Thiamine 100 mg iv q24h x5 days to  help prevent refeeding syndrome [day 2] Continue Moderate q4h SSI and adjust as needed  Monitor TPN labs on Mon/Thurs, prn Repeat TG AM labs. Please assure proper technique when drawing to not contaminate sample w/ TPN. If remains elevated, I will adjust lipids at this time    Benedetta Elvan HECKLE, PharmD, BCPS Clinical Pharmacist 10/09/2023 6:56 AM  Contact: [phone] after 3 PM

## 2023-10-09 NOTE — Consult Note (Addendum)
 Placement of VAC dressing adjacent to colostomy; abdominal  Cleansed wound with normal saline Filled wound with  ___0 piece of Mepitel, ___0_ piece of white foam, ___1_ piece of black foam  Sealed NPWT dressing at HG continuous negative pressure, no alarms, connected to wall socket. Patient received IV/PO pain medication per bedside nurse prior to dressing change; oxy/IV narcotic Patient tolerated procedure well with pain medication, finds any pressure uncomfortable. Coordinated care with primary RN.   WOC nurse will continue to provide NPWT dressing changed due to the complexity of the dressing change on Tuesdays and Fridays.   Wound type: Surgical Mid-abd open cavitye drainage  Drainage (amount, consistency, odor)  Measurement: 18 x 7 x 5 cm depth Wound bed: pale pink, moist, no immediate, clear, no malodor, no structures of concern noted at base  Periwound: intact Dressing procedure/placement/frequency: VAC foam twice a week change foam exchange, applied stretched out barrier ring to create a barrier between ostomy and open wound.   WOC Nurse ostomy separated from skin upon assessment Stoma type/location: LLQ Stomal assessment/size: 33 mm oblong, in deep crease Stoma: viable, moist, red, small amount of white non-viable tissue Peristomal assessment: intact, some erythema on edge Treatment options for stomal/peristomal skin: applied convex baseplate with 2 inch barrier ring, left stencil on dry board  Output none Ostomy pouching: 2pc Enrolled patient in [place] [employer] Discharge program: Yes  4-1pc soft convex Gerlean # [id number]/barrier rings Lawson# [phone]  Education: explained procedure to patient; verbalized understanding, but appears drowsy and vague in her response to questions, sleepy at times when reviewing frequency of change of appliance, skin care needs, frequency of VAC change. Kept repeating that VAC unit was going to be taken somewhere, began to get confused about  having 2 ostomies with some suspicious thought process. WOC to take image next dressing change. Recommend to continue ostomy education.   10/09/23 surgical site     Please reconsult if wound worsens in condition and notify provider.   Sherrilyn Hals MSN RN CWOCN WOC [facility]  [phone] (Available from 7-3 pm Mon-Friday)

## 2023-10-10 LAB — PHOSPHORUS: Phosphorus: 3.1 mg/dL (ref 2.5–4.6)

## 2023-10-10 LAB — CBC
HCT: 37.2 % (ref 36.0–46.0)
Hemoglobin: 12 g/dL (ref 12.0–15.0)
MCH: 28.4 pg (ref 26.0–34.0)
MCHC: 32.3 g/dL (ref 30.0–36.0)
MCV: 87.9 fL (ref 80.0–100.0)
Platelets: 335 K/uL (ref 150–400)
RBC: 4.23 MIL/uL (ref 3.87–5.11)
RDW: 13.4 % (ref 11.5–15.5)
WBC: 31.7 K/uL — ABNORMAL HIGH (ref 4.0–10.5)
nRBC: 0 % (ref 0.0–0.2)

## 2023-10-10 LAB — MAGNESIUM: Magnesium: 2.6 mg/dL — ABNORMAL HIGH (ref 1.7–2.4)

## 2023-10-10 LAB — COMPREHENSIVE METABOLIC PANEL WITH GFR
ALT: 9 U/L (ref 0–44)
AST: 14 U/L — ABNORMAL LOW (ref 15–41)
Albumin: 2.1 g/dL — ABNORMAL LOW (ref 3.5–5.0)
Alkaline Phosphatase: 62 U/L (ref 38–126)
Anion gap: 12 (ref 5–15)
BUN: 33 mg/dL — ABNORMAL HIGH (ref 8–23)
CO2: 19 mmol/L — ABNORMAL LOW (ref 22–32)
Calcium: 8.1 mg/dL — ABNORMAL LOW (ref 8.9–10.3)
Chloride: 102 mmol/L (ref 98–111)
Creatinine, Ser: 1.54 mg/dL — ABNORMAL HIGH (ref 0.44–1.00)
GFR, Estimated: 38 mL/min — ABNORMAL LOW (ref >60)
Glucose, Bld: 164 mg/dL — ABNORMAL HIGH (ref 70–99)
Potassium: 4.4 mmol/L (ref 3.5–5.1)
Sodium: 133 mmol/L — ABNORMAL LOW (ref 135–145)
Total Bilirubin: 0.8 mg/dL (ref 0.0–1.2)
Total Protein: 6.1 g/dL — ABNORMAL LOW (ref 6.5–8.1)

## 2023-10-10 LAB — GLUCOSE, CAPILLARY
Glucose-Capillary: 141 mg/dL — ABNORMAL HIGH (ref 70–99)
Glucose-Capillary: 144 mg/dL — ABNORMAL HIGH (ref 70–99)
Glucose-Capillary: 152 mg/dL — ABNORMAL HIGH (ref 70–99)
Glucose-Capillary: 155 mg/dL — ABNORMAL HIGH (ref 70–99)
Glucose-Capillary: 160 mg/dL — ABNORMAL HIGH (ref 70–99)
Glucose-Capillary: 189 mg/dL — ABNORMAL HIGH (ref 70–99)

## 2023-10-10 LAB — VITAMIN A: Vitamin A (Retinoic Acid): 2.7 ug/dL — ABNORMAL LOW (ref 22.0–69.5)

## 2023-10-10 LAB — TRIGLYCERIDES: Triglycerides: 81 mg/dL (ref <150)

## 2023-10-10 MED ORDER — METHOCARBAMOL 500 MG PO TABS
1000.0000 mg | ORAL_TABLET | Freq: Four times a day (QID) | ORAL | Status: DC
  Administered 2023-10-12 – 2023-10-20 (×32): 1000 mg via ORAL
  Filled 2023-10-10 (×34): qty 2

## 2023-10-10 MED ORDER — TRACE MINERALS CU-MN-SE-ZN 300-55-60-3000 MCG/ML IV SOLN
INTRAVENOUS | Status: AC
  Filled 2023-10-10: qty 699.6

## 2023-10-10 MED ORDER — TRACE MINERALS CU-MN-SE-ZN 300-55-60-3000 MCG/ML IV SOLN: INTRAVENOUS | Status: DC

## 2023-10-10 MED ORDER — ACETAMINOPHEN 500 MG PO TABS
1000.0000 mg | ORAL_TABLET | Freq: Four times a day (QID) | ORAL | Status: DC
Start: 2023-10-10 — End: 2023-10-20
  Administered 2023-10-10 – 2023-10-20 (×26): 1000 mg via ORAL
  Filled 2023-10-10 (×35): qty 2

## 2023-10-10 MED ORDER — OXYCODONE HCL 5 MG PO TABS
5.0000 mg | ORAL_TABLET | Freq: Four times a day (QID) | ORAL | Status: DC | PRN
  Administered 2023-10-10 – 2023-10-11 (×3): 10 mg via ORAL
  Administered 2023-10-12: 5 mg via ORAL
  Administered 2023-10-12 – 2023-10-13 (×4): 10 mg via ORAL
  Administered 2023-10-13: 5 mg via ORAL
  Administered 2023-10-14 – 2023-10-15 (×3): 10 mg via ORAL
  Administered 2023-10-15: 5 mg via ORAL
  Administered 2023-10-16 – 2023-10-17 (×2): 10 mg via ORAL
  Administered 2023-10-17: 5 mg via ORAL
  Administered 2023-10-18: 10 mg via ORAL
  Administered 2023-10-18: 5 mg via ORAL
  Administered 2023-10-19 – 2023-10-20 (×2): 10 mg via ORAL
  Filled 2023-10-10 (×2): qty 2
  Filled 2023-10-10: qty 1
  Filled 2023-10-10 (×13): qty 2
  Filled 2023-10-10: qty 1
  Filled 2023-10-10 (×3): qty 2

## 2023-10-10 MED ORDER — LACTATED RINGERS IV BOLUS
500.0000 mL | Freq: Once | INTRAVENOUS | Status: AC
Start: 2023-10-10 — End: 2023-10-10
  Administered 2023-10-10: 500 mL via INTRAVENOUS

## 2023-10-10 MED ORDER — FLUOXETINE HCL 20 MG PO CAPS
40.0000 mg | ORAL_CAPSULE | Freq: Every day | ORAL | Status: DC
  Administered 2023-10-10 – 2023-10-20 (×11): 40 mg via ORAL
  Filled 2023-10-10 (×11): qty 2

## 2023-10-10 NOTE — Progress Notes (Addendum)
 [facility] Progress Note  3 Days Post-Op  Subjective: CC:  Alert, cooperative. She is up in the chair.  Tolerating clears. Having incisional pain, worse with movement.  Afebrile, on low dose levophed WBC 19 > 26 > 31 today On Zosyn , MIcafungin Objective: Vital signs in last 24 hours: Temp:  [97.6 F (36.4 C)-98.4 F (36.9 C)] 98.4 F (36.9 C) (10/01 0731) Pulse Rate:  [67-99] 95 (10/01 0851) Resp:  [7-34] 20 (10/01 0851) BP: (54-122)/(14-91) 81/60 (10/01 0847) SpO2:  [75 %-100 %] 98 % (10/01 0851) Weight:  [117.8 kg] 117.8 kg (10/01 0500) Last BM Date :  (PTA)  Intake/Output from previous day: 09/30 0701 - 10/01 0700 In: 2584 [P.O.:1260; I.V.:1032.1; IV Piggyback:291.8] Out: 400 [Urine:250; Emesis/NG output:150] Intake/Output this shift: Total I/O In: 175.3 [I.V.:150.5; IV Piggyback:24.8] Out: 0   PE: Gen:  Alert, NAD, cooperative and conversant Card:  Regular rate and rhythm, pedal pulses 2+ BL, no lower extremity edema  Pulm:  Normal effort Abd: Soft, approp tender, midline incisions c/d/I see below, ostomy retracted but stoma is pale but viable, no gas/stool in ostomy pouch; wound VAC in place. GU: UOP 250 + 1 unmeasured Skin: warm and dry, no rashes  Psych: A&Ox3   Lab Results:  Recent Labs    10/09/23 0915 10/10/23 0445  WBC 26.3* 31.7*  HGB 11.4* 12.0  HCT 34.3* 37.2  PLT 322 335   BMET Recent Labs    10/09/23 0506 10/10/23 0445  NA 134* 133*  K 4.3 4.4  CL 105 102  CO2 21* 19*  GLUCOSE 229* 164*  BUN 22 33*  CREATININE 1.21* 1.54*  CALCIUM  8.0* 8.1*   PT/INR No results for input(s): LABPROT, INR in the last 72 hours. CMP     Component Value Date/Time   NA 133 (L) 10/10/2023 0445   NA 139 12/24/2017 1641   K 4.4 10/10/2023 0445   CL 102 10/10/2023 0445   CO2 19 (L) 10/10/2023 0445   GLUCOSE 164 (H) 10/10/2023 0445   BUN 33 (H) 10/10/2023 0445   BUN 15 12/24/2017 1641   CREATININE 1.54 (H) 10/10/2023 0445    CALCIUM  8.1 (L) 10/10/2023 0445   PROT 6.1 (L) 10/10/2023 0445   ALBUMIN  2.1 (L) 10/10/2023 0445   AST 14 (L) 10/10/2023 0445   ALT 9 10/10/2023 0445   ALKPHOS 62 10/10/2023 0445   BILITOT 0.8 10/10/2023 0445   GFRNONAA 38 (L) 10/10/2023 0445   GFRAA >60 06/30/2019 0221   Lipase     Component Value Date/Time   LIPASE 14 10/07/2023 1132       Studies/Results: No results found.   Anti-infectives: Anti-infectives (From admission, onward)    Start     Dose/Rate Route Frequency Ordered Stop   10/07/23 2200  piperacillin -tazobactam (ZOSYN ) IVPB 3.375 g       Placed in Followed by Linked Group   3.375 g 12.5 mL/hr over 240 Minutes Intravenous Every 8 hours 10/07/23 1351     10/07/23 1732  vancomycin variable dose per unstable renal function (pharmacist dosing)  Status:  Discontinued         Does not apply See admin instructions 10/07/23 1732 10/08/23 0825   10/07/23 1545  micafungin (MYCAMINE) 150 mg in sodium chloride  0.9 % 100 mL IVPB        150 mg 107.5 mL/hr over 1 Hours Intravenous Every 24 hours 10/07/23 1530     10/07/23 1530  metroNIDAZOLE  (FLAGYL ) IVPB 500 mg  Status:  Discontinued        500 mg 100 mL/hr over 60 Minutes Intravenous Every 12 hours 10/07/23 1518 10/07/23 1532   10/07/23 1530  vancomycin (VANCOREADY) IVPB 2000 mg/400 mL        2,000 mg 200 mL/hr over 120 Minutes Intravenous  Once 10/07/23 1526 10/07/23 2033   10/07/23 1400  metroNIDAZOLE  (FLAGYL ) IVPB 500 mg        500 mg 100 mL/hr over 60 Minutes Intravenous  Once 10/07/23 1346 10/07/23 1507   10/07/23 1400  piperacillin -tazobactam (ZOSYN ) IVPB 3.375 g       Placed in Followed by Linked Group   3.375 g 100 mL/hr over 30 Minutes Intravenous  Once 10/07/23 1351 10/07/23 1502        Assessment/Plan  Sigmoid bowel perforation with feculent peritonitis, likely due to stercoral colitis vs diverticulitis POD#3 s/p  LAPAROTOMY, EXPLORATORY, Sigmoid colon resection, Hartman's colostomy 9.28 Dr.  Rubin - continue CLD and wait ostomy function - anticipate post-operative abscess, will likely need a post-op CT scan in a couple of days; too early to CT today. - appreciate CCM mgmt septic shock  FEN: CLD TPN per pharmacy, IVF per CCM ID: Zosyn , Micafungin VTE: SCD's, SQH Foley: in place Dispo: ICU, appreciate CCM mgmt septic shock and multiple medical problems   ANCA associated vasculitis/ILD - on methotrexate /cellcept  at baseline, held now PMH ulcerative keratitis  DM2 NICM Anxiety PMH AKI/ATN 2021 after admission for diverticulitis, managed non-op with IR drain. Nephrologist: DOROTHA Blanch   LOS: 3 days   I reviewed nursing notes, hospitalist notes, last 24 h vitals and pain scores, last 48 h intake and output, last 24 h labs and trends, and last 24 h imaging results.  This care required moderate level of medical decision making.   [name] Pringle, PA-C [facility] Please see Amion for pager number during day hours 7:00am-4:30pm

## 2023-10-10 NOTE — Progress Notes (Signed)
    Durable Medical Equipment  (From admission, onward)           Start     Ordered   10/10/23 1420  For home use only DME Bedside commode  Once       Comments: Patient is confined to one room, has Generalized Weakness and Decreased Activity Tolerance which necessitate recommendation for bedside commode.  Question:  Patient needs a bedside commode to treat with the following condition  Answer:  Weakness   10/10/23 1420   10/10/23 1420  For home use only DME Walker rolling  Once       Question Answer Comment  Walker: With 5 Inch Wheels   Patient needs a walker to treat with the following condition Gait instability      10/10/23 1420

## 2023-10-10 NOTE — Progress Notes (Signed)
 PHARMACY - TOTAL PARENTERAL NUTRITION CONSULT NOTE   Indication: Prolonged ileus  Patient Measurements: Height: 5' 9 (175.3 cm) Weight: 117.8 kg (259 lb 11.2 oz) IBW/kg (Calculated) : 66.2 TPN AdjBW (KG): 79.1 Body mass index is 38.35 kg/m.   Assessment: 71 YOF admitted w/ HPI significant for left sided abdominal pain that began 7 days PTA with associated decreased oral intake, found to have perforated sigmoid colon who is s/p emergent exlap w/ resection / Hartmann's colostomy. She requires TPN for anticipated post-op ileus and prolonged inability to obtain nutrition via oral route.   Glucose / Insulin : HgbA1c 4.6 / CBG's 151-189 iSS 18 u in 24 hours + 10 units regular insulin  in TPN. Received 10 mg dexamethasone  [9/28 intra-op] Goal CBG's: 140-180 Electrolytes: Na 133, K 4.4, Ch 102, CO2 19, Ca 8.1 [CoCa 9.68], Phos 3.1, Mag 2.6 Renal: Scr 1.54 / BUN 33 Hepatic: AST 14 / ALT/alk Phos wnl / Tbili wnl / alb 2.1, TG 81 Intake / Output; MIVF: UOP 0.09 ml/kg/hr + 1 unmeasured, 150 ml NG, 0 ml colostomy, LBM PTA GI Imaging: 9/28 CT abd shows sigmoid colitis w/ perf, extraluminal gas + stool extending into small bowel mesentery, extensive inflammation changes throughout lower abd + pelvic mesentery + peritoneal thickening GI Surgeries / Procedures:  9/28 Exlap w/ resection and Hartmann's colostomy  Central access: 9/28 TPN start date: 9/29  Nutritional Goals: Goal TPN rate is 75 mL/hr (provides 105 g of protein and 2018 kcals per day)  RD Assessment: Estimated Needs Total Energy Estimated Needs: 1850-2050 Total Protein Estimated Needs: 95-110g Total Fluid Estimated Needs: >/=1.8L  Current Nutrition:  NPO and TPN  Plan:  Increase TPN to 75 mL/hr at 1800 which will deliver 105 g protein and 2018 Kcals Electrolytes in TPN: Na 70 mEq/L, K 55 mEq/L, Ca 5.36 mEq/L, Mg 0 mEq/L, and Phos 16 mmol/L. Cl:Ac 2:1 Add standard MVI and trace elements to TPN Add 20 units regular insulin  to  TPN Thiamine 100 mg iv q24h x5 days to help prevent refeeding syndrome [day 3] Continue Moderate q4h SSI and adjust as needed  Monitor TPN labs on Mon/Thurs, prn    Shundra Wirsing BS, PharmD, BCPS Clinical Pharmacist 10/10/2023 7:02 AM  Contact: [phone] after 3 PM

## 2023-10-10 NOTE — Evaluation (Addendum)
 Occupational Therapy Evaluation Patient Details Name: [name] MRN: [id number] DOB: 01-13-1960 Today's Date: 10/10/2023   History of Present Illness   Pt is a 63 y/o female presenting on 9/28 via EMS tachypneic, tachycardic and hypotensive, with complaints of left sided abdominal pain that started 7 days prior. CT abdomen pelvis was obtained which revealed marked sigmoid colitis with perforation. Admitted for treatment of Septic shock secondary to sigmoid bowel perforation with feculent peritonitis. S/p 9/28 laparotomy sigmoid resection, colostomy.  Extensive past medical history not limited to ANCA associated vasculitis, previous diffuse pulmonary alveolar hemorrhage, chronic immunosuppression type 2 diabetes, HTN, HLD, ILD, previous sigmoid colon perforation, anxiety     Clinical Impressions PTA patient independent with ADLs, mobility, driving and caring for her mother with dementia. Admitted for above and presents with problem list below, including impaired cognition (problem sovling, attention, safety awareness), generalized weakness, decreased activity tolerance and impaired balance, as well as pain.  She is able to stand with mod assist +2 from recliner using RW, complete Adls with setup to max assist.  Discussed safety, precautions and recommendations. She plans to dc to her sisters home where she will have 24/7 support. Based on performance today, pt will best benefit from continued OT services acutely and after dc at [facility] level to optimize independence and safety with ADLs, IADLs and mobility.      If plan is discharge home, recommend the following:   A lot of help with walking and/or transfers;A lot of help with bathing/dressing/bathroom;Assistance with cooking/housework;Assist for transportation;Help with stairs or ramp for entrance     Functional Status Assessment   Patient has had a recent decline in their functional status and demonstrates the ability to make significant  improvements in function in a reasonable and predictable amount of time.     Equipment Recommendations   BSC/3in1 cipriano)     Recommendations for Other Services         Precautions/Restrictions   Precautions Precautions: Fall Precaution/Restrictions Comments: wound vac, TPN, Restrictions Weight Bearing Restrictions Per Provider Order: No     Mobility Bed Mobility               General bed mobility comments: up in recliner    Transfers Overall transfer level: Needs assistance Equipment used: Rolling walker (2 wheels) Transfers: Sit to/from Stand Sit to Stand: Mod assist, +2 physical assistance           General transfer comment: cueing for hand placement and increased time/support to fully power up over 2 attempts      Balance Overall balance assessment: Needs assistance Sitting-balance support: No upper extremity supported, Feet supported Sitting balance-Leahy Scale: Fair     Standing balance support: Bilateral upper extremity supported, During functional activity, Reliant on assistive device for balance Standing balance-Leahy Scale: Poor Standing balance comment: requires heavy lean on RW                           ADL either performed or assessed with clinical judgement   ADL Overall ADL's : Needs assistance/impaired     Grooming: Set up;Wash/dry face;Sitting           Upper Body Dressing : Minimal assistance;Sitting   Lower Body Dressing: Maximal assistance;Sit to/from stand   Toilet Transfer: Moderate assistance;+2 for physical assistance Toilet Transfer Details (indicate cue type and reason): to stand, simulated         Functional mobility during ADLs: Minimal assistance;Rolling walker (2  wheels);Cueing for safety;Cueing for sequencing       Vision   Vision Assessment?: No apparent visual deficits     Perception         Praxis         Pertinent Vitals/Pain Pain Assessment Pain Assessment: Faces Faces  Pain Scale: Hurts even more Pain Location: abdomen Pain Descriptors / Indicators: Discomfort Pain Intervention(s): Limited activity within patient's tolerance, Monitored during session, Repositioned     Extremity/Trunk Assessment Upper Extremity Assessment Upper Extremity Assessment: Generalized weakness (reports fall on L side ~1 year ago with decrased L shoulder strength but ROM WFL)   Lower Extremity Assessment Lower Extremity Assessment: Defer to PT evaluation   Cervical / Trunk Assessment Cervical / Trunk Assessment: Other exceptions Cervical / Trunk Exceptions: abd surgery, increased body habitus   Communication Communication Communication: Impaired Factors Affecting Communication: Difficulty expressing self   Cognition Arousal: Alert Behavior During Therapy: Anxious, Impulsive Cognition: Cognition impaired     Awareness: Online awareness impaired Memory impairment (select all impairments): Short-term memory, Working memory Attention impairment (select first level of impairment): Sustained attention Executive functioning impairment (select all impairments): Problem solving, Reasoning, Organization OT - Cognition Comments: PT report pt not oriented to time (1925) but aware of situation.  she follows simple 1 step commands but difficulty with multiple step commands, attention and probelm sovling. easily distracted and requires redireciton to focus                 Following commands: Impaired Following commands impaired: Follows one step commands with increased time, Follows multi-step commands inconsistently     Cueing  General Comments   Cueing Techniques: Verbal cues;Gestural cues;Tactile cues;Visual cues  BP soft but stable. on RA   Exercises     Shoulder Instructions      Home Living Family/patient expects to be discharged to:: Private residence Living Arrangements: Other (Comment) (going to stay at her sisters house) Available Help at Discharge:  Family;Available 24 hours/day (sister) Type of Home: Mobile home Home Access: Level entry     Home Layout: One level     Bathroom Shower/Tub: Chief Strategy Officer: Standard Bathroom Accessibility: Yes   Home Equipment: Tub bench;Grab bars - tub/shower;Hand held shower head          Prior Functioning/Environment Prior Level of Function : Independent/Modified Independent             Mobility Comments: independent, driving ADLs Comments: independent taking care of her mother with dementia, does not cook    OT Problem List: Decreased strength;Decreased activity tolerance;Impaired balance (sitting and/or standing);Decreased cognition;Decreased safety awareness;Decreased knowledge of use of DME or AE;Decreased knowledge of precautions;Obesity;Pain   OT Treatment/Interventions: Self-care/ADL training;Therapeutic exercise;DME and/or AE instruction;Energy conservation;Therapeutic activities;Cognitive remediation/compensation;Patient/family education;Balance training      OT Goals(Current goals can be found in the care plan section)   Acute Rehab OT Goals Patient Stated Goal: get better OT Goal Formulation: With patient Time For Goal Achievement: 10/24/23 Potential to Achieve Goals: Good   OT Frequency:  Min 2X/week    Co-evaluation              AM-PAC OT 6 Clicks Daily Activity     Outcome Measure Help from another person eating meals?: A Little Help from another person taking care of personal grooming?: A Little Help from another person toileting, which includes using toliet, bedpan, or urinal?: Total Help from another person bathing (including washing, rinsing, drying)?: A Lot Help from another person to  put on and taking off regular upper body clothing?: A Little Help from another person to put on and taking off regular lower body clothing?: A Lot 6 Click Score: 14   End of Session Equipment Utilized During Treatment: Gait belt;Rolling walker  (2 wheels) Nurse Communication: Mobility status;Precautions  Activity Tolerance: Patient tolerated treatment well Patient left: in chair;with call bell/phone within reach  OT Visit Diagnosis: Other abnormalities of gait and mobility (R26.89);Muscle weakness (generalized) (M62.81);Pain;Other symptoms and signs involving cognitive function Pain - part of body:  (abd)                Time: 0941-1000 OT Time Calculation (min): 19 min Charges:  OT General Charges $OT Visit: 1 Visit OT Evaluation $OT Eval Moderate Complexity: 1 Mod  Etta NOVAK, OT Acute Rehabilitation Services Office [phone] Secure Chat Preferred    Etta GORMAN Hope 10/10/2023, 12:09 PM

## 2023-10-10 NOTE — Evaluation (Signed)
 Physical Therapy Evaluation Patient Details Name: [name] MRN: [id number] DOB: April 22, 1960 Today's Date: 10/10/2023  History of Present Illness  Pt is a 63 y/o female presenting on 9/28 via EMS tachypneic, tachycardic and hypotensive, with complaints of left sided abdominal pain that started 7 days prior. CT abdomen pelvis was obtained which revealed marked sigmoid colitis with perforation. Admitted for treatment of Septic shock secondary to sigmoid bowel perforation with feculent peritonitis. S/p 9/28 laparotomy sigmoid resection, colostomy.  Extensive past medical history not limited to ANCA associated vasculitis, previous diffuse pulmonary alveolar hemorrhage, chronic immunosuppression type 2 diabetes, HTN, HLD, ILD, previous sigmoid colon perforation, anxiety  Clinical Impression  PTA pt completely independent taking care of her mother with dementia, 24-hours a day in single story home with level entry.  Pt is currently limited in safe mobility by decreased cognition and safety awareness, in presence of abdominal pain, which is increased with movement, in presence of generalized weakness. Pt is maxA for standing and min A for ambulation with RW. With continued therapy during acute stay, PT anticipates pt will be able to return to her sister's home with level entry,  and 24 hour assistance available and HHPT.        If plan is discharge home, recommend the following: A lot of help with walking and/or transfers;A lot of help with bathing/dressing/bathroom;Assistance with cooking/housework;Direct supervision/assist for medications management;Direct supervision/assist for financial management;Assist for transportation;Supervision due to cognitive status   Can travel by private vehicle    no    Equipment Recommendations Rolling walker (2 wheels)     Functional Status Assessment Patient has had a recent decline in their functional status and demonstrates the ability to make significant  improvements in function in a reasonable and predictable amount of time.     Precautions / Restrictions Precautions Precautions: Fall Precaution/Restrictions Comments: wound vac, TPN,      Mobility  Bed Mobility               General bed mobility comments: up in recliner    Transfers Overall transfer level: Needs assistance Equipment used: Rolling walker (2 wheels) Transfers: Sit to/from Stand Sit to Stand: Max assist           General transfer comment: increased cuing for scooting forward in chair, and hand placement, heavy use of momentum and max physical A to come to upright    Ambulation/Gait Ambulation/Gait assistance: Min assist, +2 safety/equipment (close chair follow) Gait Distance (Feet): 15 Feet Assistive device: Rolling walker (2 wheels) Gait Pattern/deviations: Step-through pattern, Decreased step length - right, Decreased step length - left, Shuffle, Trunk flexed Gait velocity: slowed Gait velocity interpretation: <1.31 ft/sec, indicative of household ambulator   General Gait Details: min physical assist for steadying and second person for close chair follow for safety, vc for sequencing, and upright posture, pt initially reported not wanting to walk and was surprised by how far she was able to go      Balance Overall balance assessment: Needs assistance Sitting-balance support: Bilateral upper extremity supported, No upper extremity supported, Feet supported Sitting balance-Leahy Scale: Fair     Standing balance support: Bilateral upper extremity supported, During functional activity, Reliant on assistive device for balance Standing balance-Leahy Scale: Poor Standing balance comment: requires heavy lean on RW                             Pertinent Vitals/Pain Pain Assessment Pain Assessment: Faces Faces  Pain Scale: Hurts even more Pain Location: abdomen    Home Living Family/patient expects to be discharged to:: Private  residence Living Arrangements: Other (Comment) (going to stay at her sister's home) Available Help at Discharge: Family;Available 24 hours/day (sister) Type of Home: Mobile home Home Access: Level entry       Home Layout: One level Home Equipment: Tub bench;Grab bars - tub/shower;Hand held shower head      Prior Function Prior Level of Function : Independent/Modified Independent             Mobility Comments: independent, driving ADLs Comments: independent taking care of her mother with dementia, does not cook     Extremity/Trunk Assessment   Upper Extremity Assessment Upper Extremity Assessment: Defer to OT evaluation    Lower Extremity Assessment Lower Extremity Assessment: Generalized weakness       Communication   Communication Communication: Impaired Factors Affecting Communication: Difficulty expressing self    Cognition Arousal: Alert Behavior During Therapy: Anxious, Impulsive   PT - Cognitive impairments: Orientation   Orientation impairments: Time (asked multiple times and still endorses 1925)                   PT - Cognition Comments: pt is very talkative and needs constant direction back to Following commands: Impaired Following commands impaired: Follows one step commands with increased time, Follows multi-step commands inconsistently     Cueing Cueing Techniques: Verbal cues, Gestural cues, Tactile cues, Visual cues     General Comments General comments (skin integrity, edema, etc.): BP in sitting 74/64 (69), 87/56 (56) after ambulation, SpO2 on RA >90%O2, HR in 90s        Assessment/Plan    PT Assessment Patient needs continued PT services  PT Problem List Decreased strength;Decreased activity tolerance;Decreased balance;Decreased range of motion;Decreased mobility;Decreased cognition;Decreased safety awareness;Pain       PT Treatment Interventions DME instruction;Gait training;Functional mobility training;Therapeutic  activities;Therapeutic exercise;Balance training;Cognitive remediation;Patient/family education    PT Goals (Current goals can be found in the Care Plan section)  Acute Rehab PT Goals Patient Stated Goal: get back to her mother PT Goal Formulation: With patient Time For Goal Achievement: 10/24/23 Potential to Achieve Goals: Fair    Frequency Min 2X/week        AM-PAC PT 6 Clicks Mobility  Outcome Measure Help needed turning from your back to your side while in a flat bed without using bedrails?: A Lot Help needed moving from lying on your back to sitting on the side of a flat bed without using bedrails?: A Lot Help needed moving to and from a bed to a chair (including a wheelchair)?: A Lot Help needed standing up from a chair using your arms (e.g., wheelchair or bedside chair)?: A Lot Help needed to walk in hospital room?: A Lot Help needed climbing 3-5 steps with a railing? : Total 6 Click Score: 11    End of Session Equipment Utilized During Treatment: Gait belt Activity Tolerance: Patient tolerated treatment well Patient left: in chair;with call bell/phone within reach;with nursing/sitter in room Nurse Communication: Mobility status;Patient requests pain meds PT Visit Diagnosis: Unsteadiness on feet (R26.81);Other abnormalities of gait and mobility (R26.89);Muscle weakness (generalized) (M62.81);Difficulty in walking, not elsewhere classified (R26.2);Pain Pain - part of body:  (stomach)    Time: 9140-9067 PT Time Calculation (min) (ACUTE ONLY): 33 min   Charges:   PT Evaluation $PT Eval Moderate Complexity: 1 Mod PT Treatments $Gait Training: 8-22 mins PT General Charges $$ ACUTE PT  VISIT: 1 Visit         Terrianne Cavness B. Fleeta Lapidus PT, DPT Acute Rehabilitation Services Please use secure chat or  Call Office [phone]   Almarie KATHEE Fleeta Fleet 10/10/2023, 10:03 AM

## 2023-10-10 NOTE — Progress Notes (Signed)
 NAME:  [name], MRN:  [id number], DOB:  1960/11/18, LOS: 3 ADMISSION DATE:  10/07/2023, CONSULTATION DATE:  10/07/2023 REFERRING MD: ED, CHIEF COMPLAINT: Sigmoid perforation  History of Present Illness:  [name] is a 63 year old female with extensive past medical history significant for but not limited to ANCA associated vasculitis, previous diffuse pulmonary alveolar hemorrhage, chronic immunosuppression type 2 diabetes, HTN, HLD, ILD, previous sigmoid colon perforation, anxiety who presented to the ED via EMS with left sided abdominal pain that began 7 days prior to admission with now associated radiation to back.  Also reports decreased oral intake.  On ED arrival patient was found mildly hypothermic but later developed mild fever of 100.7, tachypneic, tachycardic, and hypotensive.  Lab work significant for K3.2, glucose 148, creatinine 1.44 with GFR 41, albumin  2.9, WBC 12.3.  CT abdomen pelvis was obtained which revealed marked sigmoid colitis with perforation evidenced by extraluminal gas and stool extending into the small bowel mesentery with extensive inflammation and early fluid collection.  Given concern for evolving sepsis PCCM consulted for further management and admission, general surgery planning urgent for surgical evaluation  Pertinent  Medical History  ANCA associated vasculitis, previous diffuse pulmonary alveolar hemorrhage, chronic immunosuppression type 2 diabetes, HTN, HLD, ILD, previous sigmoid colon perforation, anxiety  Significant Hospital Events: Including procedures, antibiotic start and stop dates in addition to other pertinent events   9/28 presented for complaints of abdominal pain with nausea and vomiting, CT confirms sigmoid perforation.  Early signs of sepsis on admission, PCCM consulted for admission 9/28 exploratory laparotomy for sigmoid bowel perforation with feculent peritonitis 9/29 still requiring pressors  Antibiotics Zosyn  9/28>> Vancomycin  9/28 Micafungin 9/28>> Metronidazole  9/28.  Interim History / Subjective:  Just now norepinephrine this morning Still with pain and discomfort  Objective    Blood pressure (!) 84/65, pulse 76, temperature 98.4 F (36.9 C), resp. rate 15, height 5' 9 (1.753 m), weight 117.8 kg, SpO2 94%.        Intake/Output Summary (Last 24 hours) at 10/10/2023 0835 Last data filed at 10/10/2023 9366 Gross per 24 hour  Intake 2531.43 ml  Output 300 ml  Net 2231.43 ml   Filed Weights   10/08/23 0500 10/09/23 0344 10/10/23 0500  Weight: 115.9 kg 115.1 kg 117.8 kg    Examination: General: Out of bed in chair, does appear comfortable HEENT: Moist oral mucosa Neuro: Alert and oriented, CV: Normal sinus rhythm PULM: Fair air entry, clear GI: obese, midline incisional dressing cdi, ostomy- pink, +bs Extremities: Warm and dry Skin: no rashes  I reviewed last 24 h vitals and pain scores, last 48 h intake and output, last 24 h labs and trends, and last 24 h imaging results.  Resolved problem list   Assessment and Plan   Septic shock secondary to sigmoid bowel perforation with feculent peritonitis S/p exploratory laparotomy and resection 9/28 -Surgery continues to follow, dressing changes per surgery -Anticipated that she may develop an abscess -For abdominal CT in a few days -On clear liquids -On TPN - Continues on antibiotics -VTE prophylaxis  ANCA Vasculitis ILD -Was on CellCept  and methotrexate  - This is on hold at the present time - Plan to reinitiate once infectious process well-controlled  Continue pulmonary hygiene with I-S/flutter device  Type 2 diabetes - Goal sugars of 140-180  Nonischemic cardiomyopathy TTE 10/23 EF 60-65%, normal right ventricular function Coronary artery disease Hypertension Hyperlipidemia P:  Holding home cardiac meds   History of anxiety - resume reduced dose  Xanax  and Wellbutrin    AKI - stable UOP and slowly improving sCr -Avoid  nephrotoxic medications - Maintain renal perfusion - Slight bump upwards of BUN/creatinine - Will bolus 500 LR  Labs   CBC: Recent Labs  Lab 10/07/23 1132 10/07/23 1541 10/07/23 1801 10/08/23 0507 10/09/23 0915 10/10/23 0445  WBC 12.3*  --  9.0 19.8* 26.3* 31.7*  NEUTROABS 11.1*  --   --   --   --   --   HGB 13.5 11.9* 13.0 12.9 11.4* 12.0  HCT 41.2 35.0* 39.4 39.8 34.3* 37.2  MCV 86.7  --  85.1 85.0 86.0 87.9  PLT 267  --  284 337 322 335    Basic Metabolic Panel: Recent Labs  Lab 10/07/23 1132 10/07/23 1541 10/07/23 1801 10/07/23 2125 10/08/23 0507 10/09/23 0506 10/10/23 0445  NA 136 139 137  --  133* 134* 133*  K 3.2* 3.2* 3.2*  --  4.1 4.3 4.4  CL 102  --  107  --  108 105 102  CO2 22  --  18*  --  17* 21* 19*  GLUCOSE 148*  --  130*  --  149* 229* 164*  BUN 18  --  18  --  18 22 33*  CREATININE 1.44*  --  1.37*  --  1.30* 1.21* 1.54*  CALCIUM  8.5*  --  7.4*  --  7.4* 8.0* 8.1*  MG  --   --   --  1.2* 1.7 1.9 2.6*  PHOS  --   --   --   --  3.6 2.9 3.1   GFR: Estimated Creatinine Clearance: 51.9 mL/min (A) (by C-G formula based on SCr of 1.54 mg/dL (H)). Recent Labs  Lab 10/07/23 1420 10/07/23 1801 10/08/23 0507 10/09/23 0915 10/10/23 0445  WBC  --  9.0 19.8* 26.3* 31.7*  LATICACIDVEN 1.9 1.0 0.8  --   --     Liver Function Tests: Recent Labs  Lab 10/07/23 1132 10/07/23 1801 10/09/23 0506 10/10/23 0445  AST 15 14* 12* 14*  ALT 10 10 9 9   ALKPHOS 79 58 49 62  BILITOT 1.9* 2.5* 0.8 0.8  PROT 6.9 5.4* 5.3* 6.1*  ALBUMIN  2.9* 2.1* 1.9* 2.1*   Recent Labs  Lab 10/07/23 1132  LIPASE 14   No results for input(s): AMMONIA in the last 168 hours.  ABG    Component Value Date/Time   PHART 7.288 (L) 10/07/2023 1541   PCO2ART 41.4 10/07/2023 1541   PO2ART 326 (H) 10/07/2023 1541   HCO3 19.8 (L) 10/07/2023 1541   TCO2 21 (L) 10/07/2023 1541   ACIDBASEDEF 6.0 (H) 10/07/2023 1541   O2SAT 100 10/07/2023 1541     Coagulation Profile: No  results for input(s): INR, PROTIME in the last 168 hours.  Cardiac Enzymes: No results for input(s): CKTOTAL, CKMB, CKMBINDEX, TROPONINI in the last 168 hours.  HbA1C: Hgb A1c MFr Bld  Date/Time Value Ref Range Status  10/07/2023 04:00 PM 4.6 (L) 4.8 - 5.6 % Final    Comment:    (NOTE) Diagnosis of Diabetes The following HbA1c ranges recommended by the American Diabetes Association (ADA) may be used as an aid in the diagnosis of diabetes mellitus.  Hemoglobin             Suggested A1C NGSP%              Diagnosis  <5.7  Non Diabetic  5.7-6.4                Pre-Diabetic  >6.4                   Diabetic  <7.0                   Glycemic control for                       adults with diabetes.    05/30/2019 09:00 AM 7.0 (H) 4.8 - 5.6 % Final    Comment:    (NOTE) Pre diabetes:          5.7%-6.4% Diabetes:              >6.4% Glycemic control for   <7.0% adults with diabetes     CBG: Recent Labs  Lab 10/09/23 1545 10/09/23 1950 10/09/23 2330 10/10/23 0447 10/10/23 0728  GLUCAP 151* 172* 188* 189* 144*   Allergies Allergies  Allergen Reactions   Mucinex [Guaifenesin Er] Nausea And Vomiting   Sulfa Antibiotics Nausea And Vomiting   Glucophage [Metformin] Nausea Only    The patient is critically ill with multiple organ systems failure and requires high complexity decision making for assessment and support, frequent evaluation and titration of therapies, application of advanced monitoring technologies and extensive interpretation of multiple databases. Critical Care Time devoted to patient care services described in this note independent of APP/resident time (if applicable)  is 31 minutes.   Jennet Epley MD [facility] Personal pager: See Amion If unanswered, please page CCM On-call: #[phone]

## 2023-10-10 NOTE — TOC Progression Note (Signed)
 Transition of Care [facility]) - Progression Note    Patient Details  Name: [name] MRN: [id number] Date of Birth: [birth year]  Transition of Care [facility]) CM/SW Contact  Tom-Johnson, Jarrod Bodkins Daphne, RN Phone Number: 10/10/2023, 2:41 PM  Clinical Narrative:     CM spoke with patient at bedside about Home Health disciplines. Patient states she has no preference to agency. CM called in referral to [facility] and Amy voiced acceptance, info on AVS. RW and BSC ordered from [employer] and [facility] to deliver to patient at bedside.  Patient not Medically ready for discharge.  CM will continue to follow as patient progresses with care towards discharge.                       Expected Discharge Plan and Services   Discharge Planning Services: CM Consult                     DME Arranged: Bedside commode, Walker rolling DME Agency: [employer] Date DME Agency Contacted: 10/10/23 Time DME Agency Contacted: 1435 Representative spoke with at DME Agency: Arthea HH Arranged: PT, OT, RN, Nurse's Aide HH Agency: [facility] Date [facility] Agency Contacted: 10/10/23 Time HH Agency Contacted: 1420 Representative spoke with at [facility] Agency: Amy   Social Drivers of Health (SDOH) Interventions SDOH Screenings   Food Insecurity: No Food Insecurity (10/08/2023)  Housing: Low Risk  (10/08/2023)  Transportation Needs: No Transportation Needs (10/08/2023)  Utilities: Not At Risk (10/08/2023)  Depression (PHQ2-9): Low Risk  (09/03/2019)  Social Connections: Moderately Integrated (10/08/2023)  Tobacco Use: Medium Risk (10/07/2023)    Readmission Risk Interventions    10/08/2023    4:30 PM  Readmission Risk Prevention Plan  Post Dischage Appt Complete  Medication Screening Complete  Transportation Screening Complete

## 2023-10-11 ENCOUNTER — Inpatient Hospital Stay (HOSPITAL_COMMUNITY)

## 2023-10-11 ENCOUNTER — Other Ambulatory Visit: Payer: Self-pay

## 2023-10-11 LAB — CBC
HCT: 33.6 % — ABNORMAL LOW (ref 36.0–46.0)
HCT: 35.4 % — ABNORMAL LOW (ref 36.0–46.0)
Hemoglobin: 10.7 g/dL — ABNORMAL LOW (ref 12.0–15.0)
Hemoglobin: 11.2 g/dL — ABNORMAL LOW (ref 12.0–15.0)
MCH: 29.1 pg (ref 26.0–34.0)
MCH: 29.5 pg (ref 26.0–34.0)
MCHC: 31.8 g/dL (ref 30.0–36.0)
MCHC: 31.6 g/dL (ref 30.0–36.0)
MCV: 91.3 fL (ref 80.0–100.0)
MCV: 93.2 fL (ref 80.0–100.0)
Platelets: 309 K/uL (ref 150–400)
Platelets: 317 K/uL (ref 150–400)
RBC: 3.68 MIL/uL — ABNORMAL LOW (ref 3.87–5.11)
RBC: 3.8 MIL/uL — ABNORMAL LOW (ref 3.87–5.11)
RDW: 13.8 % (ref 11.5–15.5)
RDW: 13.9 % (ref 11.5–15.5)
WBC: 11.5 K/uL — ABNORMAL HIGH (ref 4.0–10.5)
WBC: 12 K/uL — ABNORMAL HIGH (ref 4.0–10.5)
nRBC: 0 % (ref 0.0–0.2)
nRBC: 0.2 % (ref 0.0–0.2)

## 2023-10-11 LAB — GLUCOSE, CAPILLARY
Glucose-Capillary: 104 mg/dL — ABNORMAL HIGH (ref 70–99)
Glucose-Capillary: 110 mg/dL — ABNORMAL HIGH (ref 70–99)
Glucose-Capillary: 117 mg/dL — ABNORMAL HIGH (ref 70–99)
Glucose-Capillary: 117 mg/dL — ABNORMAL HIGH (ref 70–99)
Glucose-Capillary: 121 mg/dL — ABNORMAL HIGH (ref 70–99)
Glucose-Capillary: 123 mg/dL — ABNORMAL HIGH (ref 70–99)

## 2023-10-11 LAB — COMPREHENSIVE METABOLIC PANEL WITH GFR
ALT: 11 U/L (ref 0–44)
AST: 18 U/L (ref 15–41)
Albumin: 2 g/dL — ABNORMAL LOW (ref 3.5–5.0)
Alkaline Phosphatase: 63 U/L (ref 38–126)
Anion gap: 9 (ref 5–15)
BUN: 36 mg/dL — ABNORMAL HIGH (ref 8–23)
CO2: 22 mmol/L (ref 22–32)
Calcium: 8.2 mg/dL — ABNORMAL LOW (ref 8.9–10.3)
Chloride: 104 mmol/L (ref 98–111)
Creatinine, Ser: 1.29 mg/dL — ABNORMAL HIGH (ref 0.44–1.00)
GFR, Estimated: 47 mL/min — ABNORMAL LOW (ref >60)
Glucose, Bld: 134 mg/dL — ABNORMAL HIGH (ref 70–99)
Potassium: 4.7 mmol/L (ref 3.5–5.1)
Sodium: 135 mmol/L (ref 135–145)
Total Bilirubin: 0.4 mg/dL (ref 0.0–1.2)
Total Protein: 6 g/dL — ABNORMAL LOW (ref 6.5–8.1)

## 2023-10-11 LAB — MAGNESIUM: Magnesium: 1.9 mg/dL (ref 1.7–2.4)

## 2023-10-11 LAB — ZINC: Zinc: 22 ug/dL — ABNORMAL LOW (ref 44–115)

## 2023-10-11 LAB — VITAMIN C: Vitamin C: <0.1 mg/dL — ABNORMAL LOW (ref 0.4–2.0)

## 2023-10-11 LAB — PHOSPHORUS: Phosphorus: 2.7 mg/dL (ref 2.5–4.6)

## 2023-10-11 MED ORDER — FUROSEMIDE 10 MG/ML IJ SOLN
20.0000 mg | Freq: Once | INTRAMUSCULAR | Status: AC
  Administered 2023-10-11: 20 mg via INTRAVENOUS
  Filled 2023-10-11: qty 2

## 2023-10-11 MED ORDER — MIDODRINE HCL 5 MG PO TABS
5.0000 mg | ORAL_TABLET | Freq: Three times a day (TID) | ORAL | Status: DC
  Administered 2023-10-11 (×2): 5 mg via ORAL
  Filled 2023-10-11 (×2): qty 1

## 2023-10-11 MED ORDER — TRACE MINERALS CU-MN-SE-ZN 300-55-60-3000 MCG/ML IV SOLN
INTRAVENOUS | Status: AC
  Filled 2023-10-11: qty 699.6

## 2023-10-11 MED ORDER — MAGNESIUM SULFATE 2 GM/50ML IV SOLN
2.0000 g | Freq: Once | INTRAVENOUS | Status: AC
  Administered 2023-10-12: 2 g via INTRAVENOUS
  Filled 2023-10-11: qty 50

## 2023-10-11 NOTE — Progress Notes (Signed)
 4 Days Post-Op   Subjective/Chief Complaint: PT doing well this AM No ostomy output Worked with PT yesterday   Objective: Vital signs in last 24 hours: Temp:  [97.3 F (36.3 C)-97.9 F (36.6 C)] 97.9 F (36.6 C) (10/02 0329) Pulse Rate:  [72-202] 84 (10/02 0630) Resp:  [8-33] 14 (10/02 0630) BP: (64-219)/(31-140) 107/66 (10/02 0630) SpO2:  [70 %-100 %] 96 % (10/02 0630) Weight:  [121.7 kg] 121.7 kg (10/02 0500) Last BM Date :  (PTA)  Intake/Output from previous day: 10/01 0701 - 10/02 0700 In: 2381.6 [I.V.:1627.2; IV Piggyback:754.4] Out: 1010 [Urine:1000; Drains:10] Intake/Output this shift: No intake/output data recorded.  PE: Gen:  Alert, NAD, cooperative and conversant Card:  Regular rate and rhythm, pedal pulses 2+ BL, no lower extremity edema  Pulm:  Normal effort Abd: Soft, approp tender, midline incisions c/d/I see below, ostomy retracted but stoma is pale but viable, no gas/stool in ostomy pouch; wound VAC in place. GU: UOP 250 + 1 unmeasured Skin: warm and dry, no rashes  Psych: A&Ox3   Lab Results:  Recent Labs    10/09/23 0915 10/10/23 0445  WBC 26.3* 31.7*  HGB 11.4* 12.0  HCT 34.3* 37.2  PLT 322 335   BMET Recent Labs    10/09/23 0506 10/10/23 0445  NA 134* 133*  K 4.3 4.4  CL 105 102  CO2 21* 19*  GLUCOSE 229* 164*  BUN 22 33*  CREATININE 1.21* 1.54*  CALCIUM  8.0* 8.1*   PT/INR No results for input(s): LABPROT, INR in the last 72 hours. ABG No results for input(s): PHART, HCO3 in the last 72 hours.  Invalid input(s): PCO2, PO2  Studies/Results: No results found.  Anti-infectives: Anti-infectives (From admission, onward)    Start     Dose/Rate Route Frequency Ordered Stop   10/07/23 2200  piperacillin -tazobactam (ZOSYN ) IVPB 3.375 g       Placed in Followed by Linked Group   3.375 g 12.5 mL/hr over 240 Minutes Intravenous Every 8 hours 10/07/23 1351     10/07/23 1732  vancomycin variable dose per unstable renal  function (pharmacist dosing)  Status:  Discontinued         Does not apply See admin instructions 10/07/23 1732 10/08/23 0825   10/07/23 1545  micafungin (MYCAMINE) 150 mg in sodium chloride  0.9 % 100 mL IVPB        150 mg 107.5 mL/hr over 1 Hours Intravenous Every 24 hours 10/07/23 1530     10/07/23 1530  metroNIDAZOLE  (FLAGYL ) IVPB 500 mg  Status:  Discontinued        500 mg 100 mL/hr over 60 Minutes Intravenous Every 12 hours 10/07/23 1518 10/07/23 1532   10/07/23 1530  vancomycin (VANCOREADY) IVPB 2000 mg/400 mL        2,000 mg 200 mL/hr over 120 Minutes Intravenous  Once 10/07/23 1526 10/07/23 2033   10/07/23 1400  metroNIDAZOLE  (FLAGYL ) IVPB 500 mg        500 mg 100 mL/hr over 60 Minutes Intravenous  Once 10/07/23 1346 10/07/23 1507   10/07/23 1400  piperacillin -tazobactam (ZOSYN ) IVPB 3.375 g       Placed in Followed by Linked Group   3.375 g 100 mL/hr over 30 Minutes Intravenous  Once 10/07/23 1351 10/07/23 1502       Assessment/Plan: Sigmoid bowel perforation with feculent peritonitis, likely due to stercoral colitis vs diverticulitis POD#4 s/p  LAPAROTOMY, EXPLORATORY, Sigmoid colon resection, Hartman's colostomy 9.28 Dr. Rubin - continue CLD and wait ostomy function -  anticipate post-operative abscess, will likely need a post-op CT scan in a fri/sat; too early to CT today. 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 Nephrologist: [name]  LOS: 4 days    [name] 10/11/2023

## 2023-10-11 NOTE — Progress Notes (Signed)
 Peripherally Inserted Central Catheter Placement  The IV Nurse has discussed with the patient and/or persons authorized to consent for the patient, the purpose of this procedure and the potential benefits and risks involved with this procedure.  The benefits include less needle sticks, lab draws from the catheter, and the patient may be discharged home with the catheter. Risks include, but not limited to, infection, bleeding, blood clot (thrombus formation), and puncture of an artery; nerve damage and irregular heartbeat and possibility to perform a PICC exchange if needed/ordered by physician.  Alternatives to this procedure were also discussed.  Bard Power PICC patient education guide, fact sheet on infection prevention and patient information card has been provided to patient /or left at bedside.    PICC Placement Documentation  PICC Triple Lumen 10/11/23 Right Brachial 44 cm 0 cm (Active)  Indication for Insertion or Continuance of Line Administration of hyperosmolar/irritating solutions (i.e. TPN, Vancomycin, etc.) 10/11/23 2024  Exposed Catheter (cm) 0 cm 10/11/23 2024  Site Assessment Clean, Dry, Intact 10/11/23 2024  Lumen #1 Status Blood return noted;Flushed;Saline locked 10/11/23 2024  Lumen #2 Status Blood return noted;Flushed;Saline locked 10/11/23 2024  Lumen #3 Status Blood return noted;Flushed;Saline locked 10/11/23 2024  Dressing Type Transparent;Securing device 10/11/23 2024  Dressing Status Antimicrobial disc/dressing in place 10/11/23 2024  Line Care Connections checked and tightened 10/11/23 2024  Line Adjustment (NICU/IV Team Only) No 10/11/23 2024  Dressing Intervention New dressing;Adhesive placed at insertion site (IV team only) 10/11/23 2024  Dressing Change Due 10/18/23 10/11/23 2024       Debby Salomon CROME 10/11/2023, 8:43 PM

## 2023-10-11 NOTE — Progress Notes (Signed)
 PHARMACY - TOTAL PARENTERAL NUTRITION CONSULT NOTE   Indication: Prolonged ileus  Patient Measurements: Height: 5' 9 (175.3 cm) Weight: 121.7 kg (268 lb 4.8 oz) IBW/kg (Calculated) : 66.2 TPN AdjBW (KG): 79.1 Body mass index is 39.62 kg/m.   Assessment: 53 YOF admitted w/ HPI significant for left sided abdominal pain that began 7 days PTA with associated decreased oral intake, found to have perforated sigmoid colon who is s/p emergent exlap w/ resection / Hartmann's colostomy. She requires TPN for anticipated post-op ileus and prolonged inability to obtain nutrition via oral route.   Glucose / Insulin : HgbA1c 4.6 / CBG's 123-160 iSS 15 u in 24 hours + 20 units regular insulin  in TPN. Received 10 mg dexamethasone  [9/28 intra-op] Goal CBG's: 140-180 Electrolytes: two separate lab draws (floor draw) with electrolytes and glucose significantly higher than previously reported. Glucose resulted >900 vs CBG's that have been within goal range Renal: Scr 1.54 / BUN 33 Hepatic: AST 14 / ALT/alk Phos wnl / Tbili wnl / alb 2.1, TG 81 Intake / Output; MIVF: UOP 0.34 ml/kg/hr + 1 unmeasured, 10 ml NG, 0 ml colostomy, LBM PTA Net IO Since Admission: 11,134.88 mL [10/11/23 1011]  GI Imaging: 9/28 CT abd shows sigmoid colitis w/ perf, extraluminal gas + stool extending into small bowel mesentery, extensive inflammation changes throughout lower abd + pelvic mesentery + peritoneal thickening GI Surgeries / Procedures:  9/28 Exlap w/ resection and Hartmann's colostomy  Central access: 9/28 TPN start date: 9/29  Nutritional Goals: Goal TPN rate is 75 mL/hr (provides 105 g of protein and 2018 kcals per day)  RD Assessment: Estimated Needs Total Energy Estimated Needs: 1850-2050 Total Protein Estimated Needs: 95-110g Total Fluid Estimated Needs: >/=1.8L  Current Nutrition:  NPO and TPN  Plan:  Continue TPN to 75 mL/hr at 1800 which will deliver 105 g protein and 2018 Kcals Electrolytes in TPN:  Na 70 mEq/L, K 45 mEq/L, Ca 5 mEq/L, Mg 0 mEq/L, and Phos 10 mmol/L. Cl:Ac 1:1 [dropped bolded electrolytes due to results reported by lab personnel over the phone. TPN orders are due in by 12:30 and we only have 60 minutes left on the BUD for all ingredients utilized to compound] Add standard MVI and trace elements to TPN Add 20 units regular insulin  to TPN Thiamine 100 mg iv q24h x5 days to help prevent refeeding syndrome [day 4] Continue Moderate q4h SSI and adjust as needed  Monitor TPN labs on Mon/Thurs, prn RFP + mag 10/3 AM labs   Wessie Shanks BS, PharmD, BCPS Clinical Pharmacist 10/11/2023 10:12 AM  Contact: [phone] after 3 PM

## 2023-10-11 NOTE — Progress Notes (Signed)
 NAME:  [name], MRN:  [id number], DOB:  10-25-1960, LOS: 4 ADMISSION DATE:  10/07/2023, CONSULTATION DATE:  10/07/2023 REFERRING MD: ED, CHIEF COMPLAINT: Sigmoid perforation  History of Present Illness:  [name] is a 63 year old female with extensive past medical history significant for but not limited to ANCA associated vasculitis, previous diffuse pulmonary alveolar hemorrhage, chronic immunosuppression type 2 diabetes, HTN, HLD, ILD, previous sigmoid colon perforation, anxiety who presented to the ED via EMS with left sided abdominal pain that began 7 days prior to admission with now associated radiation to back.  Also reports decreased oral intake.  On ED arrival patient was found mildly hypothermic but later developed mild fever of 100.7, tachypneic, tachycardic, and hypotensive.  Lab work significant for K3.2, glucose 148, creatinine 1.44 with GFR 41, albumin  2.9, WBC 12.3.  CT abdomen pelvis was obtained which revealed marked sigmoid colitis with perforation evidenced by extraluminal gas and stool extending into the small bowel mesentery with extensive inflammation and early fluid collection.  Given concern for evolving sepsis PCCM consulted for further management and admission, general surgery planning urgent for surgical evaluation  Pertinent  Medical History  ANCA associated vasculitis, previous diffuse pulmonary alveolar hemorrhage, chronic immunosuppression type 2 diabetes, HTN, HLD, ILD, previous sigmoid colon perforation, anxiety  Significant Hospital Events: Including procedures, antibiotic start and stop dates in addition to other pertinent events   9/28 presented for complaints of abdominal pain with nausea and vomiting, CT confirms sigmoid perforation.  Early signs of sepsis on admission, PCCM consulted for admission 9/28 exploratory laparotomy for sigmoid bowel perforation with feculent peritonitis 9/29 still requiring pressors 10/2-off Levophed this  morning  Antibiotics Zosyn  9/28>> Vancomycin 9/28 Micafungin 9/28>> Metronidazole  9/28.  Current antibiotics only micafungin and Zosyn   Interim History / Subjective:  Just now norepinephrine this morning Still with pain and discomfort Complaint of some shortness of breath this morning  Objective    Blood pressure 107/66, pulse 84, temperature 97.9 F (36.6 C), temperature source Oral, resp. rate 14, height 5' 9 (1.753 m), weight 121.7 kg, SpO2 96%.        Intake/Output Summary (Last 24 hours) at 10/11/2023 0753 Last data filed at 10/11/2023 0600 Gross per 24 hour  Intake 2381.58 ml  Output 1010 ml  Net 1371.58 ml   Filed Weights   10/09/23 0344 10/10/23 0500 10/11/23 0500  Weight: 115.1 kg 117.8 kg 121.7 kg    Examination: General: In bed, chronically ill-appearing HEENT: Moist oral mucosa Neuro: Alert and oriented x 3 CV: S1-S2 appreciated PULM: Fair air entry bilaterally, clear breath sounds, decreased air movement at the bases GI: obese, midline incisional dressing cdi, ostomy- pink, +bs Extremities: Warm and dry Skin: no rashes  I reviewed last 24 h vitals and pain scores, last 48 h intake and output, last 24 h labs and trends, and last 24 h imaging results. Chest x-ray obtained today-no acute infiltrate, hypoventilated Blood work for this morning with pending results  Resolved problem list   Assessment and Plan   Septic shock secondary to sigmoid bowel perforation with feculent peritonitis S/p exploratory laparotomy and resection 9/28 Surgery continues to follow with dressing changes per surgery It is anticipated that she may develop an abscess without feculent peritonitis, needs close monitoring - Will plan for abdominal CT in a few days - Currently on clear liquids - On TPN - She remains on antibiotics at present - DVT prophylaxis  ANCA vasculitis ILD - Was on CellCept  and methotrexate  as outpatient -  This is on hold at present with infectious  process - Plan to reinitiate once infectious process is well-controlled  Continue pulmonary hygiene, flutter device use  Type 2 diabetes - Goal sugars of 140-180  Nonischemic cardiomyopathy with TTE showing EF of 60-65%, normal right ventricular function Coronary artery disease Hypertension Hyperlipidemia - Cardiac meds on hold  History of anxiety - On reduced dose Xanax  and Wellbutrin   AKI -There was a slight bump in BUN/creatinine on 10/1 - I's and O's with her about 11 L positive-this may not be accurate - Will give 20 mg Lasix  x 1  Labs   CBC: Recent Labs  Lab 10/07/23 1132 10/07/23 1541 10/07/23 1801 10/08/23 0507 10/09/23 0915 10/10/23 0445  WBC 12.3*  --  9.0 19.8* 26.3* 31.7*  NEUTROABS 11.1*  --   --   --   --   --   HGB 13.5 11.9* 13.0 12.9 11.4* 12.0  HCT 41.2 35.0* 39.4 39.8 34.3* 37.2  MCV 86.7  --  85.1 85.0 86.0 87.9  PLT 267  --  284 337 322 335    Basic Metabolic Panel: Recent Labs  Lab 10/07/23 1132 10/07/23 1541 10/07/23 1801 10/07/23 2125 10/08/23 0507 10/09/23 0506 10/10/23 0445  NA 136 139 137  --  133* 134* 133*  K 3.2* 3.2* 3.2*  --  4.1 4.3 4.4  CL 102  --  107  --  108 105 102  CO2 22  --  18*  --  17* 21* 19*  GLUCOSE 148*  --  130*  --  149* 229* 164*  BUN 18  --  18  --  18 22 33*  CREATININE 1.44*  --  1.37*  --  1.30* 1.21* 1.54*  CALCIUM  8.5*  --  7.4*  --  7.4* 8.0* 8.1*  MG  --   --   --  1.2* 1.7 1.9 2.6*  PHOS  --   --   --   --  3.6 2.9 3.1   GFR: Estimated Creatinine Clearance: 52.9 mL/min (A) (by C-G formula based on SCr of 1.54 mg/dL (H)). Recent Labs  Lab 10/07/23 1420 10/07/23 1801 10/08/23 0507 10/09/23 0915 10/10/23 0445  WBC  --  9.0 19.8* 26.3* 31.7*  LATICACIDVEN 1.9 1.0 0.8  --   --     Liver Function Tests: Recent Labs  Lab 10/07/23 1132 10/07/23 1801 10/09/23 0506 10/10/23 0445  AST 15 14* 12* 14*  ALT 10 10 9 9   ALKPHOS 79 58 49 62  BILITOT 1.9* 2.5* 0.8 0.8  PROT 6.9 5.4* 5.3* 6.1*   ALBUMIN  2.9* 2.1* 1.9* 2.1*   Recent Labs  Lab 10/07/23 1132  LIPASE 14   No results for input(s): AMMONIA in the last 168 hours.  ABG    Component Value Date/Time   PHART 7.288 (L) 10/07/2023 1541   PCO2ART 41.4 10/07/2023 1541   PO2ART 326 (H) 10/07/2023 1541   HCO3 19.8 (L) 10/07/2023 1541   TCO2 21 (L) 10/07/2023 1541   ACIDBASEDEF 6.0 (H) 10/07/2023 1541   O2SAT 100 10/07/2023 1541     Coagulation Profile: No results for input(s): INR, PROTIME in the last 168 hours.  Cardiac Enzymes: No results for input(s): CKTOTAL, CKMB, CKMBINDEX, TROPONINI in the last 168 hours.  HbA1C: Hgb A1c MFr Bld  Date/Time Value Ref Range Status  10/07/2023 04:00 PM 4.6 (L) 4.8 - 5.6 % Final    Comment:    (NOTE) Diagnosis of Diabetes The following HbA1c ranges  recommended by the American Diabetes Association (ADA) may be used as an aid in the diagnosis of diabetes mellitus.  Hemoglobin             Suggested A1C NGSP%              Diagnosis  <5.7                   Non Diabetic  5.7-6.4                Pre-Diabetic  >6.4                   Diabetic  <7.0                   Glycemic control for                       adults with diabetes.    05/30/2019 09:00 AM 7.0 (H) 4.8 - 5.6 % Final    Comment:    (NOTE) Pre diabetes:          5.7%-6.4% Diabetes:              >6.4% Glycemic control for   <7.0% adults with diabetes     CBG: Recent Labs  Lab 10/10/23 1118 10/10/23 1534 10/10/23 1938 10/10/23 2330 10/11/23 0326  GLUCAP 155* 152* 160* 141* 123*   Allergies Allergies  Allergen Reactions   Mucinex [Guaifenesin Er] Nausea And Vomiting   Sulfa Antibiotics Nausea And Vomiting   Glucophage [Metformin] Nausea Only    The patient is critically ill with multiple organ systems failure and requires high complexity decision making for assessment and support, frequent evaluation and titration of therapies, application of advanced monitoring technologies and  extensive interpretation of multiple databases. Critical Care Time devoted to patient care services described in this note independent of APP/resident time (if applicable)  is 33 minutes.   Jennet Epley MD [facility] Personal pager: See Amion If unanswered, please page CCM On-call: #[phone]

## 2023-10-11 NOTE — Progress Notes (Signed)
 Patient pulled picc line.  Site looks fine with no bleeding.  New PICC placement order put in.

## 2023-10-11 NOTE — Progress Notes (Signed)
 Physical Therapy Treatment Patient Details Name: [name] MRN: [id number] DOB: 11/21/1960 Today's Date: 10/11/2023   History of Present Illness Pt is a 63 y/o female presenting on 9/28 via EMS tachypneic, tachycardic and hypotensive, with complaints of left sided abdominal pain that started 7 days prior. CT abdomen pelvis was obtained which revealed marked sigmoid colitis with perforation. Admitted for treatment of Septic shock secondary to sigmoid bowel perforation with feculent peritonitis. S/p 9/28 laparotomy sigmoid resection, colostomy.  Extensive past medical history not limited to ANCA associated vasculitis, previous diffuse pulmonary alveolar hemorrhage, chronic immunosuppression type 2 diabetes, HTN, HLD, ILD, previous sigmoid colon perforation, anxiety    PT Comments  Pt in bed on entry with complaints of lots of pressure in her abdomen due to not being able to pee, despite the fact that suction canister on wall is full of urine from Purewick. Pt agreeable to getting up to recliner so she can be more upright but refuses walking today. Pt is much flatter in affect and not as talkative. Needs increased cuing for advancing mobility, however follows 1/3 commands. Pt is modA for bed mobility and transfer to standing and min A for stepping to recliner. Pt reports feeling better sitting up in recliner but reports she will not stay up long. PT encouraged her to sit up for at least an hour. D/c plans remain appropriate at this time. PT will continue to follow acutely.     If plan is discharge home, recommend the following: A lot of help with walking and/or transfers;A lot of help with bathing/dressing/bathroom;Assistance with cooking/housework;Direct supervision/assist for medications management;Direct supervision/assist for financial management;Assist for transportation;Supervision due to cognitive status   Can travel by private vehicle      Yes  Equipment Recommendations  Rolling walker (2  wheels)       Precautions / Restrictions Precautions Precautions: Fall Precaution/Restrictions Comments: wound vac, TPN, Restrictions Weight Bearing Restrictions Per Provider Order: No     Mobility  Bed Mobility Overal bed mobility: Needs Assistance Bed Mobility: Supine to Sit     Supine to sit: HOB elevated, Used rails     General bed mobility comments: educated on comfort of log rolling, and pushing to upright, before PT could flatten bed pt had moved LE off bed so PT elevated HoB and cued pt in use of bed rail to bring herself to EoB, largely ignored cuing    Transfers Overall transfer level: Needs assistance Equipment used: Rolling walker (2 wheels) Transfers: Sit to/from Stand, Bed to chair/wheelchair/BSC Sit to Stand: From elevated surface, Mod assist   Step pivot transfers: Min assist       General transfer comment: from elevated surface pt able to come to upright with modA for power up and steadying, once upright pt able to take pivotal steps to recliner, vc for bringing RW with her for support, pt refuses further mobilization today due to abdominal pain          Balance Overall balance assessment: Needs assistance Sitting-balance support: No upper extremity supported, Feet supported Sitting balance-Leahy Scale: Fair     Standing balance support: Bilateral upper extremity supported, During functional activity, Reliant on assistive device for balance Standing balance-Leahy Scale: Poor Standing balance comment: requires heavy lean on RW                            Communication Communication Communication: Impaired Factors Affecting Communication: Difficulty expressing self  Cognition Arousal: Alert  Behavior During Therapy: Anxious, Flat affect, Impulsive   PT - Cognitive impairments: Memory, Sequencing, Problem solving                       PT - Cognition Comments: very flat today, with decreased awareness of passage of time from  yesterday to today, poor problem solving with getting up to chair and need for constant reminder of task at hand, does not recognize PT from yesterdays session Following commands: Impaired Following commands impaired: Follows one step commands with increased time, Follows multi-step commands inconsistently    Cueing Cueing Techniques: Verbal cues, Gestural cues, Tactile cues, Visual cues     General Comments General comments (skin integrity, edema, etc.): pt with c/o of dizziness and keeps eyes closed most of the time. BP soft but stable 85/58 with sitting EoB, SpO2 >90%O2 on RA      Pertinent Vitals/Pain Pain Assessment Pain Assessment: Faces Faces Pain Scale: Hurts even more Pain Location: abdomen Pain Descriptors / Indicators: Discomfort Pain Intervention(s): Limited activity within patient's tolerance, Monitored during session, Repositioned     PT Goals (current goals can now be found in the care plan section) Acute Rehab PT Goals Patient Stated Goal: get back to her mother PT Goal Formulation: With patient Time For Goal Achievement: 10/24/23 Potential to Achieve Goals: Fair Progress towards PT goals: Progressing toward goals    Frequency    Min 2X/week       AM-PAC PT 6 Clicks Mobility   Outcome Measure  Help needed turning from your back to your side while in a flat bed without using bedrails?: A Lot Help needed moving from lying on your back to sitting on the side of a flat bed without using bedrails?: A Lot Help needed moving to and from a bed to a chair (including a wheelchair)?: A Lot Help needed standing up from a chair using your arms (e.g., wheelchair or bedside chair)?: A Lot Help needed to walk in hospital room?: A Lot Help needed climbing 3-5 steps with a railing? : Total 6 Click Score: 11    End of Session Equipment Utilized During Treatment: Gait belt Activity Tolerance: Patient tolerated treatment well Patient left: in chair;with call bell/phone  within reach;with nursing/sitter in room;with chair alarm set Nurse Communication: Mobility status PT Visit Diagnosis: Unsteadiness on feet (R26.81);Other abnormalities of gait and mobility (R26.89);Muscle weakness (generalized) (M62.81);Difficulty in walking, not elsewhere classified (R26.2);Pain Pain - part of body:  (stomach)     Time: 8975-8942 PT Time Calculation (min) (ACUTE ONLY): 33 min  Charges:    $Therapeutic Activity: 23-37 mins PT General Charges $$ ACUTE PT VISIT: 1 Visit                     Bary Limbach B. Fleeta Lapidus PT, DPT Acute Rehabilitation Services Please use secure chat or  Call Office [phone]    [name] Fleeta [facility] 10/11/2023, 12:25 PM

## 2023-10-11 NOTE — Progress Notes (Signed)
 Pharmacy Antibiotic Note  [name] is a 63 y.o. female for which pharmacy has been consulted for vancomycin and micafungin dosing for perforated bowel in the setting of immunocompromise.  S/p ex-lap with with purulent and feculent peritonitis. D5 abx abd infection/coverage.  Plan to repeat CT tomorrow or Sat abscess eval.   Scr 1.54  Plan: Continue micafungin 150mg  IV q24 Continue zosyn  3.375g IV q8  Height: 5' 9 (175.3 cm) Weight: 121.7 kg (268 lb 4.8 oz) IBW/kg (Calculated) : 66.2  Temp (24hrs), Avg:97.7 F (36.5 C), Min:97.3 F (36.3 C), Max:98.2 F (36.8 C)  Recent Labs  Lab 10/07/23 1132 10/07/23 1420 10/07/23 1801 10/08/23 0507 10/09/23 0506 10/09/23 0915 10/10/23 0445  WBC 12.3*  --  9.0 19.8*  --  26.3* 31.7*  CREATININE 1.44*  --  1.37* 1.30* 1.21*  --  1.54*  LATICACIDVEN  --  1.9 1.0 0.8  --   --   --     Estimated Creatinine Clearance: 52.9 mL/min (A) (by C-G formula based on SCr of 1.54 mg/dL (H)).    Allergies  Allergen Reactions   Mucinex [Guaifenesin Er] Nausea And Vomiting   Sulfa Antibiotics Nausea And Vomiting   Glucophage [Metformin] Nausea Only   9/28 vanc x1 9/28 zosyn >> 9/28 flagyl  x1 9/28 micafungin>>   MRSA+ 9/28 blood>>ngtd 9/28 urine>>aerococcus zosyn  should be fine here  Sergio Batch, PharmD, BCIDP, AAHIVP, CPP Infectious Disease Pharmacist 10/11/2023 8:54 AM

## 2023-10-11 NOTE — Plan of Care (Signed)
 Patient is oriented but confused and unsafe at times.  Pulled PICC today and undressed self.  No output from ostomy and no passing gas yet.  Good urine output.

## 2023-10-12 LAB — RENAL FUNCTION PANEL
Albumin: 1.6 g/dL — ABNORMAL LOW (ref 3.5–5.0)
Anion gap: 10 (ref 5–15)
BUN: 37 mg/dL — ABNORMAL HIGH (ref 8–23)
CO2: 22 mmol/L (ref 22–32)
Calcium: 7.8 mg/dL — ABNORMAL LOW (ref 8.9–10.3)
Chloride: 104 mmol/L (ref 98–111)
Creatinine, Ser: 1.22 mg/dL — ABNORMAL HIGH (ref 0.44–1.00)
GFR, Estimated: 50 mL/min — ABNORMAL LOW (ref >60)
Glucose, Bld: 114 mg/dL — ABNORMAL HIGH (ref 70–99)
Phosphorus: 2.6 mg/dL (ref 2.5–4.6)
Potassium: 4.7 mmol/L (ref 3.5–5.1)
Sodium: 136 mmol/L (ref 135–145)

## 2023-10-12 LAB — CULTURE, BLOOD (ROUTINE X 2)
Culture: NO GROWTH
Culture: NO GROWTH

## 2023-10-12 LAB — GLUCOSE, CAPILLARY
Glucose-Capillary: 120 mg/dL — ABNORMAL HIGH (ref 70–99)
Glucose-Capillary: 127 mg/dL — ABNORMAL HIGH (ref 70–99)
Glucose-Capillary: 158 mg/dL — ABNORMAL HIGH (ref 70–99)
Glucose-Capillary: 106 mg/dL — ABNORMAL HIGH (ref 70–99)
Glucose-Capillary: 138 mg/dL — ABNORMAL HIGH (ref 70–99)

## 2023-10-12 LAB — MAGNESIUM: Magnesium: 2.2 mg/dL (ref 1.7–2.4)

## 2023-10-12 MED ORDER — ALPRAZOLAM 0.5 MG PO TABS
0.5000 mg | ORAL_TABLET | Freq: Two times a day (BID) | ORAL | Status: DC | PRN
  Administered 2023-10-12 – 2023-10-19 (×9): 0.5 mg via ORAL
  Filled 2023-10-12 (×9): qty 1

## 2023-10-12 MED ORDER — MIDODRINE HCL 5 MG PO TABS
10.0000 mg | ORAL_TABLET | Freq: Three times a day (TID) | ORAL | Status: DC
Start: 2023-10-12 — End: 2023-10-20
  Administered 2023-10-12 – 2023-10-20 (×25): 10 mg via ORAL
  Filled 2023-10-12 (×25): qty 2

## 2023-10-12 MED ORDER — FENTANYL CITRATE PF 50 MCG/ML IJ SOSY
PREFILLED_SYRINGE | INTRAMUSCULAR | Status: AC
  Administered 2023-10-12: 50 ug via INTRAVENOUS
  Filled 2023-10-12: qty 1

## 2023-10-12 MED ORDER — INSULIN ASPART 100 UNIT/ML IJ SOLN
0.0000 [IU] | Freq: Four times a day (QID) | INTRAMUSCULAR | Status: DC
  Administered 2023-10-12: 2 [IU] via SUBCUTANEOUS
  Administered 2023-10-12 – 2023-10-13 (×2): 3 [IU] via SUBCUTANEOUS

## 2023-10-12 MED ORDER — OXYCODONE HCL 5 MG PO TABS: 5.0000 mg | ORAL_TABLET | Freq: Once | ORAL | Status: DC

## 2023-10-12 MED ORDER — TRACE MINERALS CU-MN-SE-ZN 300-55-60-3000 MCG/ML IV SOLN
INTRAVENOUS | Status: AC
  Filled 2023-10-12: qty 699.6

## 2023-10-12 MED ORDER — FENTANYL CITRATE PF 50 MCG/ML IJ SOSY: 50.0000 ug | PREFILLED_SYRINGE | INTRAMUSCULAR | Status: AC

## 2023-10-12 MED ORDER — FENTANYL CITRATE PF 50 MCG/ML IJ SOSY
50.0000 ug | PREFILLED_SYRINGE | INTRAMUSCULAR | Status: DC | PRN
  Administered 2023-10-12 – 2023-10-16 (×11): 50 ug via INTRAVENOUS
  Filled 2023-10-12 (×11): qty 1

## 2023-10-12 NOTE — Progress Notes (Signed)
 eLink Physician-Brief Progress Note Patient Name: [name] DOB: 1960/05/02 MRN: [id number]   Date of Service  10/12/2023  HPI/Events of Note  Complaining of constant pain, reviewed pain scores and consistently above 5 despite being on oxycodone  and fentanyl  as needed.  On pregabalin, scheduled Tylenol , Lidoderm  as well  Non-camera room  eICU Interventions  One-time oxycodone  Mild hypotension with MAP greater than 65, continue current care unless sustained MAP less than 60 Maintain current as needed analgesic schedule     Intervention Category Intermediate Interventions: Pain - evaluation and management  Maxx Calaway 10/12/2023, 11:14 PM

## 2023-10-12 NOTE — Progress Notes (Signed)
 Pt received at 4E, CCMD notified, oriented to floor BP (!) 88/74 (BP Location: Left Wrist)   Pulse 84   Temp 98.2 F (36.8 C) (Oral)   Resp 17   Ht 5' 9 (1.753 m)   Wt 122.9 kg   SpO2 99%   BMI 40.01 kg/m

## 2023-10-12 NOTE — Progress Notes (Signed)
 OT Cancellation Note  Patient Details Name: [name] MRN: [id number] DOB: [birth year]   Cancelled Treatment:    Reason Eval/Treat Not Completed: Patient declined, no reason specified- pt reports fatigued and pain from dressing change.  Declined OOB at this time and requested OT to return as able.  Will follow and see as able.   Etta NOVAK, OT Acute Rehabilitation Services Office [phone] Secure Chat Preferred    Etta GORMAN [place] 10/12/2023, 11:53 AM

## 2023-10-12 NOTE — Consult Note (Addendum)
 WOC Nurse wound follow up Vac change performed.  Pt was medicated for pain prior to the procedure and tolerated with mod amt discomfort.  Abd full thickness post-op wound is beefy red with yellow adipose scattered throughout. Mod amt pink drainage. 18X7X5cm Applied barrier ring  strip to area between wound and ostomy, since they are in close proximity, and then applied one piece black foam to cont suction.  WOC will plan to change again on Tuesday.  Supplies at bedside for use.     WOC Nurse ostomy follow up Colostomy pouch changed and teaching session performed.  Pt watched using a hand held mirror but did not ast. Stoma is located in a deep crease located at 3:00 o'clock and 9:00 o'clock. It is yellow/yed and flush with skin level, 2 inches and oval. Pt was able to opne and close Velcro to empty. No stool or flatus.  Applied barrier ring and 2 piece pouching system.  Will need a more flexible system next time.   Ordered 5 sets of flexible convex one piece pouches to the room, as will as 10 barrier rings.  Use supplies: Order Supplies: 1pc soft convex [id number], barrier ring [phone], belt #[id number] Enrolled patient in [place] Secure Start Discharge program: Yes, previously WOC team will perform another pouch change and teaching session on Tuesday, along with the Vac change since both are located in close proximity and unable to be sperated very successfully.  Thank-you,   Stephane Fought MSN, RN, CWOCN, CWCN-AP, CNS Contact Mon-Fri 0700-1500: [phone]

## 2023-10-12 NOTE — Plan of Care (Signed)
  Problem: Education: Goal: Ability to describe self-care measures that may prevent or decrease complications (Diabetes Survival Skills Education) will improve 10/12/2023 0702 by Woodford Rupert LABOR, RN Outcome: Progressing 10/12/2023 0702 by Woodford Rupert LABOR, RN Outcome: Progressing   Problem: Coping: Goal: Ability to adjust to condition or change in health will improve 10/12/2023 0702 by Woodford Rupert LABOR, RN Outcome: Progressing 10/12/2023 0702 by Woodford Rupert LABOR, RN Outcome: Progressing   Problem: Health Behavior/Discharge Planning: Goal: Ability to identify and utilize available resources and services will improve Outcome: Progressing   Problem: Metabolic: Goal: Ability to maintain appropriate glucose levels will improve 10/12/2023 0702 by Woodford Rupert A, RN Outcome: Progressing 10/12/2023 0702 by Woodford Rupert LABOR, RN Outcome: Progressing   Problem: Nutritional: Goal: Maintenance of adequate nutrition will improve Outcome: Progressing Goal: Progress toward achieving an optimal weight will improve Outcome: Progressing   Problem: Skin Integrity: Goal: Risk for impaired skin integrity will decrease 10/12/2023 0702 by Woodford Rupert LABOR, RN Outcome: Progressing 10/12/2023 0702 by Woodford Rupert A, RN Outcome: Progressing   Problem: Tissue Perfusion: Goal: Adequacy of tissue perfusion will improve Outcome: Progressing

## 2023-10-12 NOTE — Progress Notes (Signed)
 NAME:  [name], MRN:  [id number], DOB:  [birth year], LOS: 5 ADMISSION DATE:  10/07/2023, CONSULTATION DATE:  10/07/2023 REFERRING MD: ED, CHIEF COMPLAINT: Sigmoid perforation  History of Present Illness:  [name] is a 63 year old female with extensive past medical history significant for but not limited to ANCA associated vasculitis, previous diffuse pulmonary alveolar hemorrhage, chronic immunosuppression type 2 diabetes, HTN, HLD, ILD, previous sigmoid colon perforation, anxiety who presented to the ED via EMS with left sided abdominal pain that began 7 days prior to admission with now associated radiation to back.  Also reports decreased oral intake.  On ED arrival patient was found mildly hypothermic but later developed mild fever of 100.7, tachypneic, tachycardic, and hypotensive.  Lab work significant for K3.2, glucose 148, creatinine 1.44 with GFR 41, albumin  2.9, WBC 12.3.  CT abdomen pelvis was obtained which revealed marked sigmoid colitis with perforation evidenced by extraluminal gas and stool extending into the small bowel mesentery with extensive inflammation and early fluid collection.  Given concern for evolving sepsis PCCM consulted for further management and admission, general surgery planning urgent for surgical evaluation  Pertinent  Medical History  ANCA associated vasculitis, previous diffuse pulmonary alveolar hemorrhage, chronic immunosuppression type 2 diabetes, HTN, HLD, ILD, previous sigmoid colon perforation, anxiety  Significant Hospital Events: Including procedures, antibiotic start and stop dates in addition to other pertinent events   9/28 presented for complaints of abdominal pain with nausea and vomiting, CT confirms sigmoid perforation.  Early signs of sepsis on admission, PCCM consulted for admission 9/28 exploratory laparotomy for sigmoid bowel perforation with feculent peritonitis 9/29 still requiring pressors 10/2-off Levophed this  morning 10/3-back on Levophed at a very low dose 1 mcg  Antibiotics Zosyn  9/28>> Vancomycin 9/28 Micafungin 9/28>> Metronidazole  9/28.  Current antibiotics only micafungin and Zosyn   Interim History / Subjective:  On epinephrine 1 mcg this morning Still having abdominal discomfort  Objective    Blood pressure 116/75, pulse 83, temperature 98.9 F (37.2 C), temperature source Oral, resp. rate 12, height 5' 9 (1.753 m), weight 122.9 kg, SpO2 96%.        Intake/Output Summary (Last 24 hours) at 10/12/2023 9191 Last data filed at 10/12/2023 0500 Gross per 24 hour  Intake 1140.9 ml  Output 3350 ml  Net -2209.1 ml   Filed Weights   10/10/23 0500 10/11/23 0500 10/12/23 0400  Weight: 117.8 kg 121.7 kg 122.9 kg    Examination: General: In bed, chronically ill-appearing HEENT: Moist oral mucosa Neuro: Alert and oriented x 3 CV: S1-S2 appreciated PULM: Fair air entry bilaterally, clear breath sounds, decreased air movement at the bases GI: Bowel sounds appreciated Extremities: Warm and dry Skin: no rashes  I reviewed last 24 h vitals and pain scores, last 48 h intake and output, last 24 h labs and trends, and last 24 h imaging results.  Resolved problem list   Assessment and Plan   Septic shock secondary to sigmoid bowel perforation with feculent peritonitis S/p exploratory laparotomy and resection 9/28 Concern for possible development of feculent peritonitis based on findings during surgery-plan will be to repeat CT in a few days - Tolerating clear liquids - On TPN - Remains on Zosyn  and micafungin - Leukocytosis is better - Continue DVT prophylaxis - Dose of midodrine increased to 10, 3 times daily  ANCA vasculitis ILD - Was on CellCept  and methotrexate  as outpatient - This is on hold at present with infectious process - Resume immunomodulators once sepsis picture clears  Type 2 diabetes - Goal blood sugars of 140-180  Nonischemic cardiomyopathy with TTE  showing EF of 60 to 65% Normal right ventricular function Coronary artery disease Hypertension Hyperlipidemia - Cardiac meds on hold  History of anxiety - On Xanax  and Wellbutrin   AKI - Bump in BUN/creatinine appears to be stable   Labs   CBC: Recent Labs  Lab 10/07/23 1132 10/07/23 1541 10/08/23 0507 10/09/23 0915 10/10/23 0445 10/11/23 1015 10/11/23 1142  WBC 12.3*   < > 19.8* 26.3* 31.7* 11.5* 12.0*  NEUTROABS 11.1*  --   --   --   --   --   --   HGB 13.5   < > 12.9 11.4* 12.0 10.7* 11.2*  HCT 41.2   < > 39.8 34.3* 37.2 33.6* 35.4*  MCV 86.7   < > 85.0 86.0 87.9 91.3 93.2  PLT 267   < > 337 322 335 309 317   < > = values in this interval not displayed.    Basic Metabolic Panel: Recent Labs  Lab 10/08/23 0507 10/09/23 0506 10/10/23 0445 10/11/23 1330 10/12/23 0345  NA 133* 134* 133* 135 136  K 4.1 4.3 4.4 4.7 4.7  CL 108 105 102 104 104  CO2 17* 21* 19* 22 22  GLUCOSE 149* 229* 164* 134* 114*  BUN 18 22 33* 36* 37*  CREATININE 1.30* 1.21* 1.54* 1.29* 1.22*  CALCIUM  7.4* 8.0* 8.1* 8.2* 7.8*  MG 1.7 1.9 2.6* 1.9 2.2  PHOS 3.6 2.9 3.1 2.7 2.6   GFR: Estimated Creatinine Clearance: 67.1 mL/min (A) (by C-G formula based on SCr of 1.22 mg/dL (H)). Recent Labs  Lab 10/07/23 1420 10/07/23 1801 10/08/23 0507 10/09/23 0915 10/10/23 0445 10/11/23 1015 10/11/23 1142  WBC  --  9.0 19.8* 26.3* 31.7* 11.5* 12.0*  LATICACIDVEN 1.9 1.0 0.8  --   --   --   --     Liver Function Tests: Recent Labs  Lab 10/07/23 1132 10/07/23 1801 10/09/23 0506 10/10/23 0445 10/11/23 1330 10/12/23 0345  AST 15 14* 12* 14* 18  --   ALT 10 10 9 9 11   --   ALKPHOS 79 58 49 62 63  --   BILITOT 1.9* 2.5* 0.8 0.8 0.4  --   PROT 6.9 5.4* 5.3* 6.1* 6.0*  --   ALBUMIN  2.9* 2.1* 1.9* 2.1* 2.0* 1.6*   Recent Labs  Lab 10/07/23 1132  LIPASE 14   No results for input(s): AMMONIA in the last 168 hours.  ABG    Component Value Date/Time   PHART 7.288 (L) 10/07/2023 1541    PCO2ART 41.4 10/07/2023 1541   PO2ART 326 (H) 10/07/2023 1541   HCO3 19.8 (L) 10/07/2023 1541   TCO2 21 (L) 10/07/2023 1541   ACIDBASEDEF 6.0 (H) 10/07/2023 1541   O2SAT 100 10/07/2023 1541     Coagulation Profile: No results for input(s): INR, PROTIME in the last 168 hours.  Cardiac Enzymes: No results for input(s): CKTOTAL, CKMB, CKMBINDEX, TROPONINI in the last 168 hours.  HbA1C: Hgb A1c MFr Bld  Date/Time Value Ref Range Status  10/07/2023 04:00 PM 4.6 (L) 4.8 - 5.6 % Final    Comment:    (NOTE) Diagnosis of Diabetes The following HbA1c ranges recommended by the American Diabetes Association (ADA) may be used as an aid in the diagnosis of diabetes mellitus.  Hemoglobin             Suggested A1C NGSP%  Diagnosis  <5.7                   Non Diabetic  5.7-6.4                Pre-Diabetic  >6.4                   Diabetic  <7.0                   Glycemic control for                       adults with diabetes.    05/30/2019 09:00 AM 7.0 (H) 4.8 - 5.6 % Final    Comment:    (NOTE) Pre diabetes:          5.7%-6.4% Diabetes:              >6.4% Glycemic control for   <7.0% adults with diabetes     CBG: Recent Labs  Lab 10/11/23 1548 10/11/23 1922 10/11/23 2331 10/12/23 0319 10/12/23 0731  GLUCAP 104* 117* 121* 120* 127*   Allergies Allergies  Allergen Reactions   Mucinex [Guaifenesin Er] Nausea And Vomiting   Sulfa Antibiotics Nausea And Vomiting   Glucophage [Metformin] Nausea Only    The patient is critically ill with multiple organ systems failure and requires high complexity decision making for assessment and support, frequent evaluation and titration of therapies, application of advanced monitoring technologies and extensive interpretation of multiple databases. Critical Care Time devoted to patient care services described in this note independent of APP/resident time (if applicable)  is 32 minutes.   Jennet Epley  MD [facility] Personal pager: See Amion If unanswered, please page CCM On-call: #[phone]

## 2023-10-12 NOTE — Progress Notes (Signed)
 5 Days Post-Op   Subjective/Chief Complaint: Pt doing well this AM Some confusion last night   Objective: Vital signs in last 24 hours: Temp:  [97.9 F (36.6 C)-99.2 F (37.3 C)] 98.9 F (37.2 C) (10/03 0732) Pulse Rate:  [78-94] 83 (10/03 0700) Resp:  [12-21] 12 (10/03 0700) BP: (79-116)/(57-83) 116/75 (10/03 0700) SpO2:  [93 %-99 %] 96 % (10/03 0700) Weight:  [122.9 kg] 122.9 kg (10/03 0400) Last BM Date :  (PTA)  Intake/Output from previous day: 10/02 0701 - 10/03 0700 In: 1435.8 [P.O.:360; I.V.:778.8; IV Piggyback:297] Out: 3350 [Urine:3350] Intake/Output this shift: No intake/output data recorded.  PE: Gen:  Alert, NAD, cooperative and conversant Card:  Regular rate and rhythm, pedal pulses 2+ BL, no lower extremity edema  Pulm:  Normal effort Abd: Soft, approp tender, midline incisions c/d/I see below, ostomy retracted but stoma is pale but viable, no gas/stool in ostomy pouch; wound VAC in place. GU: UOP 250 + 1 unmeasured Skin: warm and dry, no rashes  Psych: A&Ox3    Lab Results:  Recent Labs    10/11/23 1015 10/11/23 1142  WBC 11.5* 12.0*  HGB 10.7* 11.2*  HCT 33.6* 35.4*  PLT 309 317   BMET Recent Labs    10/11/23 1330 10/12/23 0345  NA 135 136  K 4.7 4.7  CL 104 104  CO2 22 22  GLUCOSE 134* 114*  BUN 36* 37*  CREATININE 1.29* 1.22*  CALCIUM  8.2* 7.8*   PT/INR No results for input(s): LABPROT, INR in the last 72 hours. ABG No results for input(s): PHART, HCO3 in the last 72 hours.  Invalid input(s): PCO2, PO2  Studies/Results: US  EKG SITE RITE Result Date: 10/11/2023 If Site Rite image not attached, placement could not be confirmed due to current cardiac rhythm.  DG CHEST PORT 1 VIEW Result Date: 10/11/2023 EXAM: 1 VIEW(S) XRAY OF THE CHEST 10/11/2023 08:22:00 AM COMPARISON: 10/07/2023 CLINICAL HISTORY: Shortness of breath. FINDINGS: LINES, TUBES AND DEVICES: Right PICC in place with tip at the superior cavoatrial  junction. LUNGS AND PLEURA: Low lung volumes. No focal pulmonary opacity. No pulmonary edema. No pleural effusion. No pneumothorax. HEART AND MEDIASTINUM: No acute abnormality of the cardiac and mediastinal silhouettes. BONES AND SOFT TISSUES: No acute osseous abnormality. IMPRESSION: 1. No acute cardiopulmonary process. 2. Low lung volumes. Electronically signed by: Ryan Salvage MD 10/11/2023 12:09 PM EDT RP Workstation: [id number]    Anti-infectives: Anti-infectives (From admission, onward)    Start     Dose/Rate Route Frequency Ordered Stop   10/07/23 2200  piperacillin -tazobactam (ZOSYN ) IVPB 3.375 g       Placed in Followed by Linked Group   3.375 g 12.5 mL/hr over 240 Minutes Intravenous Every 8 hours 10/07/23 1351     10/07/23 1732  vancomycin variable dose per unstable renal function (pharmacist dosing)  Status:  Discontinued         Does not apply See admin instructions 10/07/23 1732 10/08/23 0825   10/07/23 1545  micafungin (MYCAMINE) 150 mg in sodium chloride  0.9 % 100 mL IVPB        150 mg 107.5 mL/hr over 1 Hours Intravenous Every 24 hours 10/07/23 1530     10/07/23 1530  metroNIDAZOLE  (FLAGYL ) IVPB 500 mg  Status:  Discontinued        500 mg 100 mL/hr over 60 Minutes Intravenous Every 12 hours 10/07/23 1518 10/07/23 1532   10/07/23 1530  vancomycin (VANCOREADY) IVPB 2000 mg/400 mL  2,000 mg 200 mL/hr over 120 Minutes Intravenous  Once 10/07/23 1526 10/07/23 2033   10/07/23 1400  metroNIDAZOLE  (FLAGYL ) IVPB 500 mg        500 mg 100 mL/hr over 60 Minutes Intravenous  Once 10/07/23 1346 10/07/23 1507   10/07/23 1400  piperacillin -tazobactam (ZOSYN ) IVPB 3.375 g       Placed in Followed by Linked Group   3.375 g 100 mL/hr over 30 Minutes Intravenous  Once 10/07/23 1351 10/07/23 1502       Assessment/Plan: Sigmoid bowel perforation with feculent peritonitis, likely due to stercoral colitis vs diverticulitis POD#5 s/p  LAPAROTOMY, EXPLORATORY, Sigmoid colon  resection, Hartman's colostomy 9.28 Dr. 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 Nephrologist: [name]  LOS: 5 days    Lynda Rubin 10/12/2023

## 2023-10-12 NOTE — Progress Notes (Signed)
 PHARMACY - TOTAL PARENTERAL NUTRITION CONSULT NOTE   Indication: Prolonged ileus  Patient Measurements: Height: 5' 9 (175.3 cm) Weight: 122.9 kg (270 lb 15.1 oz) IBW/kg (Calculated) : 66.2 TPN AdjBW (KG): 79.1 Body mass index is 40.01 kg/m.   Assessment: 48 YOF admitted w/ HPI significant for left sided abdominal pain that began 7 days PTA with associated decreased oral intake, found to have perforated sigmoid colon who is s/p emergent exlap w/ resection / Hartmann's colostomy. She requires TPN for anticipated post-op ileus and prolonged inability to obtain nutrition via oral route.   Glucose / Insulin : HgbA1c 4.6 / CBG's 123-160 iSS 2 u in 24 hours + 20 units regular insulin  in TPN. Received 10 mg dexamethasone  [9/28 intra-op] - Goal CBG's: 140-180 Electrolytes: Na 136, K 4.7, Cl 104, CO2 22, CoCa 9.72, Phos 2.6, Mg 2.2  Renal: Scr 1.22 / BUN 37 Hepatic: AST/ ALT/alk Phos wnl / Tbili wnl / alb 1.6, TG 81 (10/1) Intake / Output; MIVF: UOP 1.1 ml/kg/hr, 0 ml NG, 0 ml colostomy, LBM PTA Net IO Since Admission: 8,925.78 mL [10/12/23 0903]  GI Imaging: 9/28 CT abd shows sigmoid colitis w/ perf, extraluminal gas + stool extending into small bowel mesentery, extensive inflammation changes throughout lower abd + pelvic mesentery + peritoneal thickening GI Surgeries / Procedures:  9/28 Exlap w/ resection and Hartmann's colostomy  Central access: 9/28 TPN start date: 9/29  Nutritional Goals: Goal TPN rate is 75 mL/hr (provides 105 g of protein and 2018 kcals per day)  RD Assessment: Estimated Needs Total Energy Estimated Needs: 1850-2050 Total Protein Estimated Needs: 95-110g Total Fluid Estimated Needs: >/=1.8L  Current Nutrition:  NPO and TPN  Plan:  Continue TPN to 75 mL/hr at 1800 which will deliver 100% estimated nutritional needs Electrolytes in TPN: Na 70 mEq/L, K 40 mEq/L, Ca 5 mEq/L, Mg 5 mEq/L, and Phos 15 mmol/L. Cl:Ac 1:1 Add standard MVI and trace elements to  TPN Add 20 units regular insulin  to TPN Thiamine 100 mg iv q24h x5 days to help prevent refeeding syndrome [day 4] Continue Moderate q6h SSI and adjust as needed  Monitor TPN labs on Mon/Thurs, prn  Thank you for allowing pharmacy to be a part of this patient's care.  Shelba Collier, PharmD, BCPS Clinical Pharmacist

## 2023-10-12 NOTE — Plan of Care (Signed)
   Problem: Education: Goal: Ability to describe self-care measures that may prevent or decrease complications (Diabetes Survival Skills Education) will improve Outcome: Progressing   Problem: Coping: Goal: Ability to adjust to condition or change in health will improve Outcome: Progressing   Problem: Health Behavior/Discharge Planning: Goal: Ability to identify and utilize available resources and services will improve Outcome: Progressing   Problem: Metabolic: Goal: Ability to maintain appropriate glucose levels will improve Outcome: Progressing   Problem: Nutritional: Goal: Progress toward achieving an optimal weight will improve Outcome: Progressing   Problem: Skin Integrity: Goal: Risk for impaired skin integrity will decrease Outcome: Progressing

## 2023-10-13 DIAGNOSIS — K631 Perforation of intestine (nontraumatic): Secondary | ICD-10-CM

## 2023-10-13 LAB — BASIC METABOLIC PANEL WITH GFR
Anion gap: 7 (ref 5–15)
BUN: 32 mg/dL — ABNORMAL HIGH (ref 8–23)
CO2: 24 mmol/L (ref 22–32)
Calcium: 7.9 mg/dL — ABNORMAL LOW (ref 8.9–10.3)
Chloride: 108 mmol/L (ref 98–111)
Creatinine, Ser: 0.99 mg/dL (ref 0.44–1.00)
GFR, Estimated: >60 mL/min (ref >60)
Glucose, Bld: 88 mg/dL (ref 70–99)
Potassium: 5.2 mmol/L — ABNORMAL HIGH (ref 3.5–5.1)
Sodium: 139 mmol/L (ref 135–145)

## 2023-10-13 LAB — GLUCOSE, CAPILLARY
Glucose-Capillary: 124 mg/dL — ABNORMAL HIGH (ref 70–99)
Glucose-Capillary: 126 mg/dL — ABNORMAL HIGH (ref 70–99)
Glucose-Capillary: 152 mg/dL — ABNORMAL HIGH (ref 70–99)
Glucose-Capillary: 95 mg/dL (ref 70–99)

## 2023-10-13 LAB — MAGNESIUM: Magnesium: 1.8 mg/dL (ref 1.7–2.4)

## 2023-10-13 LAB — PHOSPHORUS: Phosphorus: 4.1 mg/dL (ref 2.5–4.6)

## 2023-10-13 MED ORDER — INSULIN ASPART 100 UNIT/ML IJ SOLN
0.0000 [IU] | Freq: Four times a day (QID) | INTRAMUSCULAR | Status: DC
  Administered 2023-10-13 – 2023-10-14 (×3): 1 [IU] via SUBCUTANEOUS

## 2023-10-13 MED ORDER — TRAVASOL 10 % IV SOLN
INTRAVENOUS | Status: AC
  Filled 2023-10-13: qty 998.4

## 2023-10-13 MED ORDER — ONDANSETRON HCL 4 MG/2ML IJ SOLN
4.0000 mg | Freq: Four times a day (QID) | INTRAMUSCULAR | Status: DC
  Administered 2023-10-13 – 2023-10-20 (×29): 4 mg via INTRAVENOUS
  Filled 2023-10-13 (×30): qty 2

## 2023-10-13 NOTE — Progress Notes (Signed)
 PROGRESS NOTE    [name]  FMW:[id number] DOB: 1960/01/31 DOA: 10/07/2023 PCP: Lynwood Laneta ORN, PA-C   Brief Narrative:  63 year old female with past medical history significant for ANCA vasculitis, chronic immunosuppression, type 2 diabetes, hypertension, hyperlipidemia, ILD and previous sigmoid colon perforation who presented to the ER via EMS with left-sided abdominal pain that began 7 days prior to admission.  On arrival patient was hypothermic and admitted with fever, tachypnea, tachycardic and hypotensive.  Workup revealed mild sigmoid colitis with perforation.  Patient admitted to ICU for septic shock.  She underwent exploratory laparotomy on 9/28, sigmoid bowel perforation with feculent peritonitis noted.  She was transferred to [facility] on 10/4.  She remains on TPN.  Has been requiring frequent pain medications and is noted to have softer blood pressures.  Assessment & Plan:   >>Sigmoid perforation/feculent peritonitis: Status post ex lap 9/28, sigmoid colon resection, Hartman's colostomy by Dr. Rubin General Surgery managing. Continue TPN, Liquid diet, awaiting bowel function, ileostomy Patient is off pressors, blood pressures within low normal range.  Continue midodrine 10 mg 3 times a day increased on 10/3. Continue analgesics, patient has been requiring frequent IV fentanyl , oxycodone  p.o.  Receiving scheduled Robaxin 1000 every 6 hours. Continue IV Zosyn , micafungin IV Zofran  added  >>ANCA associated vasculitis/ILD, on methotrexate /CellCept  at baseline, currently on hold,  continue incruse  >>Diabetes mellitus type 2: Continue sliding scale insulin  coverage  >>NICM:  On Coreg , Entresto  PTA.  Will hold at this time due to hypotension.  >> Borderline hyperkalemia: Potassium 5.2 today.  Will recheck labs in the a.m.  Anxiety: Continue alprazolam , bupropion , fluoxetine  Principal Problem:   Perforation of sigmoid colon - stercoral      DVT prophylaxis: Heparin   subcu Code Status: Full code Family Communication: No family at the bedside Disposition Plan: TBD Status is: Inpatient Remains inpatient appropriate because: No bowel function yet, continues to be on TPN  Subjective:  Patient seen and examined at the bedside.  She reports of some nausea today.  She has been taking some clears.  Also reports of abdominal discomfort that has been going on and is treated with IV fentanyl  as needed as well as oxycodone .  Some succus noted in ostomy. Blood pressure has been on the softer side but patient asymptomatic.  Objective: Vitals:   10/13/23 0406 10/13/23 0428 10/13/23 0754 10/13/23 1115  BP: (!) 91/55  104/63 98/62  Pulse: 83  86 79  Resp: 12  17 13   Temp: 98.2 F (36.8 C)  98.2 F (36.8 C) 98.1 F (36.7 C)  TempSrc: Oral  Oral Oral  SpO2: 99%  100% 96%  Weight:  130.2 kg    Height:        Intake/Output Summary (Last 24 hours) at 10/13/2023 1210 Last data filed at 10/13/2023 1116 Gross per 24 hour  Intake 312.23 ml  Output 3675 ml  Net -3362.77 ml   Filed Weights   10/11/23 0500 10/12/23 0400 10/13/23 0428  Weight: 121.7 kg 122.9 kg 130.2 kg    Examination:  General exam: Alert oriented, not in any acute distress  respiratory system: Bilateral decreased breath sounds at bases Cardiovascular system: S1 & S2 heard, Rate controlled Gastrointestinal system: Wound VAC on the right abdominal wound, ostomy with some succus, mild diffuse tenderness to palpation, bowel sounds  neg extremities: No cyanosis, clubbing, edema  Central nervous system: Alert and oriented. No focal neurological deficits. Moving extremities Skin: No rashes, lesions or ulcers Psychiatry: Judgement and insight appear  normal. Mood & affect appropriate.     Data Reviewed: I have personally reviewed following labs and imaging studies  CBC: Recent Labs  Lab 10/07/23 1132 10/07/23 1541 10/08/23 0507 10/09/23 0915 10/10/23 0445 10/11/23 1015 10/11/23 1142   WBC 12.3*   < > 19.8* 26.3* 31.7* 11.5* 12.0*  NEUTROABS 11.1*  --   --   --   --   --   --   HGB 13.5   < > 12.9 11.4* 12.0 10.7* 11.2*  HCT 41.2   < > 39.8 34.3* 37.2 33.6* 35.4*  MCV 86.7   < > 85.0 86.0 87.9 91.3 93.2  PLT 267   < > 337 322 335 309 317   < > = values in this interval not displayed.   Basic Metabolic Panel: Recent Labs  Lab 10/09/23 0506 10/10/23 0445 10/11/23 1330 10/12/23 0345 10/13/23 0617  NA 134* 133* 135 136 139  K 4.3 4.4 4.7 4.7 5.2*  CL 105 102 104 104 108  CO2 21* 19* 22 22 24   GLUCOSE 229* 164* 134* 114* 88  BUN 22 33* 36* 37* 32*  CREATININE 1.21* 1.54* 1.29* 1.22* 0.99  CALCIUM  8.0* 8.1* 8.2* 7.8* 7.9*  MG 1.9 2.6* 1.9 2.2 1.8  PHOS 2.9 3.1 2.7 2.6 4.1   GFR: Estimated Creatinine Clearance: 85.4 mL/min (by C-G formula based on SCr of 0.99 mg/dL). Liver Function Tests: Recent Labs  Lab 10/07/23 1132 10/07/23 1801 10/09/23 0506 10/10/23 0445 10/11/23 1330 10/12/23 0345  AST 15 14* 12* 14* 18  --   ALT 10 10 9 9 11   --   ALKPHOS 79 58 49 62 63  --   BILITOT 1.9* 2.5* 0.8 0.8 0.4  --   PROT 6.9 5.4* 5.3* 6.1* 6.0*  --   ALBUMIN  2.9* 2.1* 1.9* 2.1* 2.0* 1.6*   Recent Labs  Lab 10/07/23 1132  LIPASE 14   No results for input(s): AMMONIA in the last 168 hours. Coagulation Profile: No results for input(s): INR, PROTIME in the last 168 hours. Cardiac Enzymes: No results for input(s): CKTOTAL, CKMB, CKMBINDEX, TROPONINI in the last 168 hours. BNP (last 3 results) No results for input(s): PROBNP in the last 8760 hours. HbA1C: No results for input(s): HGBA1C in the last 72 hours. CBG: Recent Labs  Lab 10/12/23 1536 10/12/23 1903 10/13/23 0014 10/13/23 0634 10/13/23 1117  GLUCAP 106* 138* 152* 95 126*   Lipid Profile: No results for input(s): CHOL, HDL, LDLCALC, TRIG, CHOLHDL, LDLDIRECT in the last 72 hours. Thyroid Function Tests: No results for input(s): TSH, T4TOTAL, FREET4, T3FREE,  THYROIDAB in the last 72 hours. Anemia Panel: No results for input(s): VITAMINB12, FOLATE, FERRITIN, TIBC, IRON, RETICCTPCT in the last 72 hours. Sepsis Labs: Recent Labs  Lab 10/07/23 1420 10/07/23 1801 10/08/23 0507  LATICACIDVEN 1.9 1.0 0.8    Recent Results (from the past 240 hours)  Urine Culture     Status: Abnormal   Collection Time: 10/07/23 12:24 PM   Specimen: Urine, Random  Result Value Ref Range Status   Specimen Description URINE, RANDOM  Final   Special Requests NONE Reflexed from [id number]  Final   Culture (A)  Final    >=100,000 COLONIES/mL AEROCOCCUS SPECIES Standardized susceptibility testing for this organism is not available. Performed at [facility], [address] [place], [place] [address]    Report Status 10/08/2023 FINAL  Final  Culture, blood (routine x 2)     Status: None   Collection Time:  10/07/23  2:03 PM   Specimen: BLOOD LEFT ARM  Result Value Ref Range Status   Specimen Description BLOOD LEFT ARM  Final   Special Requests   Final    BOTTLES DRAWN AEROBIC AND ANAEROBIC Blood Culture results may not be optimal due to an inadequate volume of blood received in culture bottles   Culture   Final    NO GROWTH 5 DAYS Performed at [facility], [address] [place], [place] [address]    Report Status 10/12/2023 FINAL  Final  Culture, blood (routine x 2)     Status: None   Collection Time: 10/07/23  2:04 PM   Specimen: BLOOD LEFT ARM  Result Value Ref Range Status   Specimen Description BLOOD LEFT ARM  Final   Special Requests   Final    BOTTLES DRAWN AEROBIC AND ANAEROBIC Blood Culture results may not be optimal due to an inadequate volume of blood received in culture bottles   Culture   Final    NO GROWTH 5 DAYS Performed at [facility], [address] [place], [place] [address]    Report Status 10/12/2023 FINAL  Final  MRSA Next Gen by PCR, Nasal     Status: Abnormal   Collection Time: 10/07/23  3:28 PM    Specimen: Nasal Mucosa; Nasal Swab  Result Value Ref Range Status   MRSA by PCR Next Gen DETECTED (A) NOT DETECTED Final    Comment: (NOTE) The GeneXpert MRSA Assay (FDA approved for NASAL specimens only), is one component of a comprehensive MRSA colonization surveillance program. It is not intended to diagnose MRSA infection nor to guide or monitor treatment for MRSA infections. Test performance is not FDA approved in patients less than 15 years old. Performed at [facility], [address] [place], [place] [address]          Radiology Studies: US  EKG SITE RITE Result Date: 10/11/2023 If Site Rite image not attached, placement could not be confirmed due to current cardiac rhythm.       Scheduled Meds:  acetaminophen   1,000 mg Oral QID   buPROPion   450 mg Oral Daily   Chlorhexidine  Gluconate Cloth  6 each Topical Daily   feeding supplement  1 Container Oral TID BM   FLUoxetine  40 mg Oral Daily   heparin  injection (subcutaneous)  5,000 Units Subcutaneous Q8H   insulin  aspart  0-9 Units Subcutaneous Q6H   lidocaine   2 patch Transdermal Q24H   methocarbamol  1,000 mg Oral QID   midodrine  10 mg Oral TID WC   mupirocin ointment   Nasal BID   ondansetron  (ZOFRAN ) IV  4 mg Intravenous Q6H   oxyCODONE   5 mg Oral Once   polyethylene glycol  17 g Oral Daily   pregabalin  50 mg Oral BID   sodium chloride  flush  10-40 mL Intracatheter Q12H   umeclidinium bromide  1 puff Inhalation Daily   Continuous Infusions:  micafungin (MYCAMINE) 150 mg in sodium chloride  0.9 % 100 mL IVPB 150 mg (10/12/23 2125)   piperacillin -tazobactam 3.375 g (10/13/23 0619)   TPN ADULT (ION) 75 mL/hr at 10/12/23 2211   TPN ADULT (ION)            Derryl Duval, MD [facility] 10/13/2023, 12:10 PM

## 2023-10-13 NOTE — Progress Notes (Signed)
 PHARMACY - TOTAL PARENTERAL NUTRITION CONSULT NOTE   Indication: Prolonged ileus  Patient Measurements: Height: 5' 9 (175.3 cm) Weight: 130.2 kg (287 lb 0.6 oz) IBW/kg (Calculated) : 66.2 TPN AdjBW (KG): 79.1 Body mass index is 42.39 kg/m.   Assessment:  [name] admitted w/ HPI significant for left sided abdominal pain that began 7 days PTA with associated decreased oral intake, found to have perforated sigmoid colon who is s/p emergent exlap w/ resection / Hartmann's colostomy. She requires TPN for anticipated post-op ileus and prolonged inability to obtain nutrition via oral route.   Glucose / Insulin : HgbA1c 4.6 / BG 95-158, used 10 units SSI/24 hours + 20 units regular insulin  in TPN.    Electrolytes:  K 5.2, CoCa 9.8, phos 4.1 up, Mg 1.8 down, others wnl Renal: Scr 0.99, BUN 32 Hepatic: AST/ ALT/alk Phos/ Tbili wnl, alb 1.6, TG 81 (10/1) Intake / Output; MIVF: UOP 0.7 ml/kg/hr, drains 75ml, colostomy 0 ml, LBM PTA   GI Imaging: 9/28 CT abd shows sigmoid colitis w/ perf, extraluminal gas + stool extending into small bowel mesentery, extensive inflammation changes throughout lower abd + pelvic mesentery + peritoneal thickening GI Surgeries / Procedures:  9/28 Exlap w/ resection and Hartmann's colostomy  Central access: 9/28 TPN start date: 9/29  Nutritional Goals: Goal TPN rate is 80 mL/hr (provides 100 g of protein and 1993 kcals per day)  RD Assessment: Estimated Needs Total Energy Estimated Needs: 1850-2050 Total Protein Estimated Needs: 95-110g Total Fluid Estimated Needs: >/=1.8L  Current Nutrition:  TPN 10/3 CLD: Boost x2 given to patient  Plan:  Continue TPN unconcentrated at goal 80 mL/hr at 1800, which provides 100% estimated nutritional needs Electrolytes in TPN: increase Na 75 mEq/L, decrease K 0 mEq/L, Ca 5 mEq/L, increase Mg 8 mEq/L, decrease Phos 10 mmol/L. Cl:Ac 1:1 Add standard MVI and trace elements to TPN Decrease to 16 units regular insulin  to  TPN Decrease to sensitive q6h SSI and adjust as needed  Monitor TPN labs on Mon/Thurs, 10/5 and prn F/u PO intake/Colostomy output, wean TPN as able   Thank you for allowing pharmacy to be a part of this patient's care.  Jinnie Door, PharmD, BCPS, BCCP Clinical Pharmacist  Please check AMION for all [facility] Pharmacy phone numbers After 10:00 PM, call Main Pharmacy [phone]

## 2023-10-13 NOTE — Progress Notes (Signed)
 6 Days Post-Op   Subjective/Chief Complaint: Complains of soreness everywhere. Has noted some gas in bag   Objective: Vital signs in last 24 hours: Temp:  [97.5 F (36.4 C)-98.2 F (36.8 C)] 98.2 F (36.8 C) (10/04 0754) Pulse Rate:  [80-93] 86 (10/04 0754) Resp:  [11-22] 17 (10/04 0754) BP: (82-166)/(55-150) 104/63 (10/04 0754) SpO2:  [89 %-100 %] 100 % (10/04 0754) Weight:  [130.2 kg] 130.2 kg (10/04 0428) Last BM Date :  (PTA)  Intake/Output from previous day: 10/03 0701 - 10/04 0700 In: 906.2 [I.V.:843.9; IV Piggyback:62.3] Out: 2375 [Urine:2300; Drains:75] Intake/Output this shift: No intake/output data recorded.  General appearance: alert and cooperative Resp: clear to auscultation bilaterally Cardio: regular rate and rhythm GI: soft, mild tenderness. Ostomy pink with only small amount of air in bag  Lab Results:  Recent Labs    10/11/23 1015 10/11/23 1142  WBC 11.5* 12.0*  HGB 10.7* 11.2*  HCT 33.6* 35.4*  PLT 309 317   BMET Recent Labs    10/12/23 0345 10/13/23 0617  NA 136 139  K 4.7 5.2*  CL 104 108  CO2 22 24  GLUCOSE 114* 88  BUN 37* 32*  CREATININE 1.22* 0.99  CALCIUM  7.8* 7.9*   PT/INR No results for input(s): LABPROT, INR in the last 72 hours. ABG No results for input(s): PHART, HCO3 in the last 72 hours.  Invalid input(s): PCO2, PO2  Studies/Results: US  EKG SITE RITE Result Date: 10/11/2023 If Site Rite image not attached, placement could not be confirmed due to current cardiac rhythm.   Anti-infectives: Anti-infectives (From admission, onward)    Start     Dose/Rate Route Frequency Ordered Stop   10/07/23 2200  piperacillin -tazobactam (ZOSYN ) IVPB 3.375 g       Placed in Followed by Linked Group   3.375 g 12.5 mL/hr over 240 Minutes Intravenous Every 8 hours 10/07/23 1351     10/07/23 1732  vancomycin variable dose per unstable renal function (pharmacist dosing)  Status:  Discontinued         Does not apply See  admin instructions 10/07/23 1732 10/08/23 0825   10/07/23 1545  micafungin (MYCAMINE) 150 mg in sodium chloride  0.9 % 100 mL IVPB        150 mg 107.5 mL/hr over 1 Hours Intravenous Every 24 hours 10/07/23 1530     10/07/23 1530  metroNIDAZOLE  (FLAGYL ) IVPB 500 mg  Status:  Discontinued        500 mg 100 mL/hr over 60 Minutes Intravenous Every 12 hours 10/07/23 1518 10/07/23 1532   10/07/23 1530  vancomycin (VANCOREADY) IVPB 2000 mg/400 mL        2,000 mg 200 mL/hr over 120 Minutes Intravenous  Once 10/07/23 1526 10/07/23 2033   10/07/23 1400  metroNIDAZOLE  (FLAGYL ) IVPB 500 mg        500 mg 100 mL/hr over 60 Minutes Intravenous  Once 10/07/23 1346 10/07/23 1507   10/07/23 1400  piperacillin -tazobactam (ZOSYN ) IVPB 3.375 g       Placed in Followed by Linked Group   3.375 g 100 mL/hr over 30 Minutes Intravenous  Once 10/07/23 1351 10/07/23 1502       Assessment/Plan: s/p Procedure(s): LAPAROTOMY, EXPLORATORY (N/A) EXCISION, SMALL INTESTINE (N/A) Advance diet. Allow fulls today Sigmoid bowel perforation with feculent peritonitis, likely due to stercoral colitis vs diverticulitis POD#6 s/p  LAPAROTOMY, EXPLORATORY, Sigmoid colon resection, Hartman's colostomy 9.28 Dr. 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 Nephrologist: [name]  LOS: 6 days    [name] 10/13/2023

## 2023-10-13 NOTE — Plan of Care (Signed)
  Problem: Education: Goal: Ability to describe self-care measures that may prevent or decrease complications (Diabetes Survival Skills Education) will improve Outcome: Progressing   Problem: Coping: Goal: Ability to adjust to condition or change in health will improve Outcome: Progressing   Problem: Fluid Volume: Goal: Ability to maintain a balanced intake and output will improve Outcome: Progressing   Problem: Health Behavior/Discharge Planning: Goal: Ability to manage health-related needs will improve Outcome: Progressing   Problem: Metabolic: Goal: Ability to maintain appropriate glucose levels will improve Outcome: Progressing   Problem: Nutritional: Goal: Maintenance of adequate nutrition will improve Outcome: Progressing   Problem: Skin Integrity: Goal: Risk for impaired skin integrity will decrease Outcome: Progressing   Problem: Tissue Perfusion: Goal: Adequacy of tissue perfusion will improve Outcome: Progressing   Problem: Education: Goal: Knowledge of General Education information will improve Description: Including pain rating scale, medication(s)/side effects and non-pharmacologic comfort measures Outcome: Progressing   Problem: Health Behavior/Discharge Planning: Goal: Ability to manage health-related needs will improve Outcome: Progressing   Problem: Clinical Measurements: Goal: Ability to maintain clinical measurements within normal limits will improve Outcome: Progressing Goal: Will remain free from infection Outcome: Progressing Goal: Diagnostic test results will improve Outcome: Progressing Goal: Respiratory complications will improve Outcome: Progressing Goal: Cardiovascular complication will be avoided Outcome: Progressing   Problem: Nutrition: Goal: Adequate nutrition will be maintained Outcome: Progressing   Problem: Elimination: Goal: Will not experience complications related to bowel motility Outcome: Progressing Goal: Will not  experience complications related to urinary retention Outcome: Progressing   Problem: Pain Managment: Goal: General experience of comfort will improve and/or be controlled Outcome: Progressing   Problem: Safety: Goal: Ability to remain free from injury will improve Outcome: Progressing   Problem: Skin Integrity: Goal: Risk for impaired skin integrity will decrease Outcome: Progressing   Problem: Activity: Goal: Risk for activity intolerance will decrease Outcome: Not Progressing   Problem: Coping: Goal: Level of anxiety will decrease Outcome: Not Progressing

## 2023-10-13 NOTE — Progress Notes (Signed)
 Pt called RN and stated that her ostomy was leaking. RN came in to assess and confirmed that ostomy was leaking underneath the flange/adhesive. RN checked to see if WOC was working today. No WOC on call. Looked at note and confirmed supplies needed. Not stocked on the floor at this time. RN requested that Diplomatic Services operational officer order necessary supplies. Will change once tubed.

## 2023-10-14 LAB — BASIC METABOLIC PANEL WITH GFR
Anion gap: 10 (ref 5–15)
BUN: 26 mg/dL — ABNORMAL HIGH (ref 8–23)
CO2: 23 mmol/L (ref 22–32)
Calcium: 8.1 mg/dL — ABNORMAL LOW (ref 8.9–10.3)
Chloride: 104 mmol/L (ref 98–111)
Creatinine, Ser: 0.96 mg/dL (ref 0.44–1.00)
GFR, Estimated: >60 mL/min (ref >60)
Glucose, Bld: 112 mg/dL — ABNORMAL HIGH (ref 70–99)
Potassium: 5 mmol/L (ref 3.5–5.1)
Sodium: 137 mmol/L (ref 135–145)

## 2023-10-14 LAB — GLUCOSE, CAPILLARY
Glucose-Capillary: 118 mg/dL — ABNORMAL HIGH (ref 70–99)
Glucose-Capillary: 123 mg/dL — ABNORMAL HIGH (ref 70–99)
Glucose-Capillary: 128 mg/dL — ABNORMAL HIGH (ref 70–99)
Glucose-Capillary: 124 mg/dL — ABNORMAL HIGH (ref 70–99)

## 2023-10-14 LAB — PHOSPHORUS: Phosphorus: 3.9 mg/dL (ref 2.5–4.6)

## 2023-10-14 LAB — MAGNESIUM: Magnesium: 1.9 mg/dL (ref 1.7–2.4)

## 2023-10-14 MED ORDER — DEXTROSE 10 % IV SOLN: INTRAVENOUS | Status: AC

## 2023-10-14 MED ORDER — TRAVASOL 10 % IV SOLN: INTRAVENOUS | Status: DC

## 2023-10-14 MED ORDER — ADULT MULTIVITAMIN W/MINERALS CH
1.0000 | ORAL_TABLET | Freq: Every day | ORAL | Status: DC
  Administered 2023-10-14 – 2023-10-20 (×6): 1 via ORAL
  Filled 2023-10-14 (×7): qty 1

## 2023-10-14 MED ORDER — TRAVASOL 10 % IV SOLN
INTRAVENOUS | Status: AC
  Filled 2023-10-14: qty 999

## 2023-10-14 NOTE — Progress Notes (Signed)
 PROGRESS NOTE    [name]  FMW:[id number] DOB: 27-Dec-1960 DOA: 10/07/2023 PCP: Lynwood Laneta ORN, PA-C   Brief Narrative:  63 year old female with past medical history significant for ANCA vasculitis, chronic immunosuppression, type 2 diabetes, hypertension, hyperlipidemia, ILD and previous sigmoid colon perforation who presented to the ER via EMS with left-sided abdominal pain that began 7 days prior to admission.  On arrival patient was hypothermic and admitted with fever, tachypnea, tachycardic and hypotensive.  Workup revealed mild sigmoid colitis with perforation.  Patient admitted to ICU for septic shock.  She underwent exploratory laparotomy on 9/28, sigmoid bowel perforation with feculent peritonitis noted.  She was transferred to [facility] on 10/4.  She remains on TPN.  Has been requiring frequent pain medications and is noted to have softer blood pressures but blood pressure seems to have improved in the past 24 hours.  Assessment & Plan:   >>Sigmoid perforation/feculent peritonitis: Status post ex lap 9/28, sigmoid colon resection, Hartman's colostomy by Dr. Rubin General Surgery managing. Continue TPN, starting to have bowel function, started on GI soft diet. Patient is off pressors, blood pressures within low normal range.  Continue midodrine 10 mg 3 times a day increased on 10/3. Continue analgesics, patient has been requiring frequent IV fentanyl , oxycodone  p.o.  Receiving scheduled Robaxin 1000 every 6 hours. Continue IV Zosyn , micafungin (start 9/28) IV Zofran    >>ANCA associated vasculitis/ILD, on methotrexate /CellCept  at baseline, currently on hold,  continue incruse  >>Diabetes mellitus type 2: Continue sliding scale insulin  coverage  >>NICM:  On Coreg , Entresto  PTA.  Will continue to hold at this time due to hypotension issues.  Resume gradually as appropriate.  >> Borderline hyperkalemia: Potassium 5.0 today.  Will recheck labs in the a.m.  Anxiety: Continue  alprazolam , bupropion , fluoxetine  Principal Problem:   Perforation of sigmoid colon - stercoral      DVT prophylaxis: Heparin  subcu Code Status: Full code Family Communication: No family at the bedside Disposition Plan: TBD Status is: Inpatient Remains inpatient appropriate because: No bowel function yet, continues to be on TPN  Subjective:  Patient seen and examined at the bedside.  Has some liquid stool in ostomy today.  She says this was already changed earlier today.  Reports of some nausea and some abdominal discomfort.  Vital signs are stable with improvement in blood pressure.  No fever or chills.  Objective: Vitals:   10/13/23 2312 10/14/23 0350 10/14/23 0759 10/14/23 0816  BP: 125/71 110/69 115/70   Pulse: 81 84 88 92  Resp: 20 20 16 18   Temp: 98.6 F (37 C) 99 F (37.2 C) 98.6 F (37 C)   TempSrc: Oral Oral Oral   SpO2: 100% 97% 97%   Weight:  122.4 kg    Height:        Intake/Output Summary (Last 24 hours) at 10/14/2023 0913 Last data filed at 10/14/2023 9377 Gross per 24 hour  Intake 2750.48 ml  Output 3975 ml  Net -1224.52 ml   Filed Weights   10/12/23 0400 10/13/23 0428 10/14/23 0350  Weight: 122.9 kg 130.2 kg 122.4 kg    Examination:  General exam: Alert oriented, not in any acute distress  respiratory system: Bilateral decreased breath sounds at bases Cardiovascular system: S1 & S2 heard, Rate controlled Gastrointestinal system: Wound VAC on the right abdominal wound, ostomy with some succus, mild diffuse tenderness to palpation, bowel sounds  neg extremities: No cyanosis, clubbing, edema  Central nervous system: Alert and oriented. No focal neurological deficits. Moving  extremities Skin: No rashes, lesions or ulcers Psychiatry: Judgement and insight appear normal. Mood & affect appropriate.     Data Reviewed: I have personally reviewed following labs and imaging studies  CBC: Recent Labs  Lab 10/07/23 1132 10/07/23 1541 10/08/23 0507  10/09/23 0915 10/10/23 0445 10/11/23 1015 10/11/23 1142  WBC 12.3*   < > 19.8* 26.3* 31.7* 11.5* 12.0*  NEUTROABS 11.1*  --   --   --   --   --   --   HGB 13.5   < > 12.9 11.4* 12.0 10.7* 11.2*  HCT 41.2   < > 39.8 34.3* 37.2 33.6* 35.4*  MCV 86.7   < > 85.0 86.0 87.9 91.3 93.2  PLT 267   < > 337 322 335 309 317   < > = values in this interval not displayed.   Basic Metabolic Panel: Recent Labs  Lab 10/10/23 0445 10/11/23 1330 10/12/23 0345 10/13/23 0617 10/14/23 0500  NA 133* 135 136 139 137  K 4.4 4.7 4.7 5.2* 5.0  CL 102 104 104 108 104  CO2 19* 22 22 24 23   GLUCOSE 164* 134* 114* 88 112*  BUN 33* 36* 37* 32* 26*  CREATININE 1.54* 1.29* 1.22* 0.99 0.96  CALCIUM  8.1* 8.2* 7.8* 7.9* 8.1*  MG 2.6* 1.9 2.2 1.8 1.9  PHOS 3.1 2.7 2.6 4.1 3.9   GFR: Estimated Creatinine Clearance: 85.1 mL/min (by C-G formula based on SCr of 0.96 mg/dL). Liver Function Tests: Recent Labs  Lab 10/07/23 1132 10/07/23 1801 10/09/23 0506 10/10/23 0445 10/11/23 1330 10/12/23 0345  AST 15 14* 12* 14* 18  --   ALT 10 10 9 9 11   --   ALKPHOS 79 58 49 62 63  --   BILITOT 1.9* 2.5* 0.8 0.8 0.4  --   PROT 6.9 5.4* 5.3* 6.1* 6.0*  --   ALBUMIN  2.9* 2.1* 1.9* 2.1* 2.0* 1.6*   Recent Labs  Lab 10/07/23 1132  LIPASE 14   No results for input(s): AMMONIA in the last 168 hours. Coagulation Profile: No results for input(s): INR, PROTIME in the last 168 hours. Cardiac Enzymes: No results for input(s): CKTOTAL, CKMB, CKMBINDEX, TROPONINI in the last 168 hours. BNP (last 3 results) No results for input(s): PROBNP in the last 8760 hours. HbA1C: No results for input(s): HGBA1C in the last 72 hours. CBG: Recent Labs  Lab 10/13/23 0634 10/13/23 1117 10/13/23 1640 10/14/23 0000 10/14/23 0621  GLUCAP 95 126* 124* 123* 118*   Lipid Profile: No results for input(s): CHOL, HDL, LDLCALC, TRIG, CHOLHDL, LDLDIRECT in the last 72 hours. Thyroid Function Tests: No  results for input(s): TSH, T4TOTAL, FREET4, T3FREE, THYROIDAB in the last 72 hours. Anemia Panel: No results for input(s): VITAMINB12, FOLATE, FERRITIN, TIBC, IRON, RETICCTPCT in the last 72 hours. Sepsis Labs: Recent Labs  Lab 10/07/23 1420 10/07/23 1801 10/08/23 0507  LATICACIDVEN 1.9 1.0 0.8    Recent Results (from the past 240 hours)  Urine Culture     Status: Abnormal   Collection Time: 10/07/23 12:24 PM   Specimen: Urine, Random  Result Value Ref Range Status   Specimen Description URINE, RANDOM  Final   Special Requests NONE Reflexed from [id number]  Final   Culture (A)  Final    >=100,000 COLONIES/mL AEROCOCCUS SPECIES Standardized susceptibility testing for this organism is not available. Performed at [facility] Lab, [address], [place], [place] [address]    Report Status 10/08/2023 FINAL  Final  Culture, blood (routine  x 2)     Status: None   Collection Time: 10/07/23  2:03 PM   Specimen: BLOOD LEFT ARM  Result Value Ref Range Status   Specimen Description BLOOD LEFT ARM  Final   Special Requests   Final    BOTTLES DRAWN AEROBIC AND ANAEROBIC Blood Culture results may not be optimal due to an inadequate volume of blood received in culture bottles   Culture   Final    NO GROWTH 5 DAYS Performed at [facility] Lab, [address] [place], [place] [address]    Report Status 10/12/2023 FINAL  Final  Culture, blood (routine x 2)     Status: None   Collection Time: 10/07/23  2:04 PM   Specimen: BLOOD LEFT ARM  Result Value Ref Range Status   Specimen Description BLOOD LEFT ARM  Final   Special Requests   Final    BOTTLES DRAWN AEROBIC AND ANAEROBIC Blood Culture results may not be optimal due to an inadequate volume of blood received in culture bottles   Culture   Final    NO GROWTH 5 DAYS Performed at [facility] Lab, [address] [place], [place] [address]    Report Status 10/12/2023 FINAL  Final  MRSA Next Gen by PCR, Nasal      Status: Abnormal   Collection Time: 10/07/23  3:28 PM   Specimen: Nasal Mucosa; Nasal Swab  Result Value Ref Range Status   MRSA by PCR Next Gen DETECTED (A) NOT DETECTED Final    Comment: (NOTE) The GeneXpert MRSA Assay (FDA approved for NASAL specimens only), is one component of a comprehensive MRSA colonization surveillance program. It is not intended to diagnose MRSA infection nor to guide or monitor treatment for MRSA infections. Test performance is not FDA approved in patients less than 65 years old. Performed at [facility] Lab, [address], [place], [place] [address]          Radiology Studies: No results found.       Scheduled Meds:  acetaminophen   1,000 mg Oral QID   buPROPion   450 mg Oral Daily   Chlorhexidine  Gluconate Cloth  6 each Topical Daily   feeding supplement  1 Container Oral TID BM   FLUoxetine  40 mg Oral Daily   heparin  injection (subcutaneous)  5,000 Units Subcutaneous Q8H   lidocaine   2 patch Transdermal Q24H   methocarbamol  1,000 mg Oral QID   midodrine  10 mg Oral TID WC   multivitamin with minerals  1 tablet Oral Daily   mupirocin ointment   Nasal BID   ondansetron  (ZOFRAN ) IV  4 mg Intravenous Q6H   oxyCODONE   5 mg Oral Once   polyethylene glycol  17 g Oral Daily   pregabalin  50 mg Oral BID   sodium chloride  flush  10-40 mL Intracatheter Q12H   umeclidinium bromide  1 puff Inhalation Daily   Continuous Infusions:  micafungin (MYCAMINE) 150 mg in sodium chloride  0.9 % 100 mL IVPB 150 mg (10/13/23 2357)   piperacillin -tazobactam 3.375 g (10/14/23 0611)   TPN ADULT (ION) 80 mL/hr at 10/13/23 1804   TPN ADULT (ION)            Derryl Duval, MD [facility] 10/14/2023, 9:13 AM

## 2023-10-14 NOTE — Plan of Care (Signed)

## 2023-10-14 NOTE — Progress Notes (Signed)
 7 Days Post-Op   Subjective/Chief Complaint: Complains of soreness   Objective: Vital signs in last 24 hours: Temp:  [97.6 F (36.4 C)-99 F (37.2 C)] 98.6 F (37 C) (10/05 0759) Pulse Rate:  [79-92] 92 (10/05 0816) Resp:  [13-20] 18 (10/05 0816) BP: (98-127)/(62-71) 115/70 (10/05 0759) SpO2:  [96 %-100 %] 97 % (10/05 0759) Weight:  [122.4 kg] 122.4 kg (10/05 0350) Last BM Date :  (PTA)  Intake/Output from previous day: 10/04 0701 - 10/05 0700 In: 2750.5 [I.V.:2307.3; IV Piggyback:443.2] Out: 3975 [Urine:3950; Drains:25] Intake/Output this shift: No intake/output data recorded.  General appearance: alert and cooperative Resp: clear to auscultation bilaterally Cardio: regular rate and rhythm GI: soft, mild tenderness. Ostomy pink and productive  Lab Results:  Recent Labs    10/11/23 1015 10/11/23 1142  WBC 11.5* 12.0*  HGB 10.7* 11.2*  HCT 33.6* 35.4*  PLT 309 317   BMET Recent Labs    10/13/23 0617 10/14/23 0500  NA 139 137  K 5.2* 5.0  CL 108 104  CO2 24 23  GLUCOSE 88 112*  BUN 32* 26*  CREATININE 0.99 0.96  CALCIUM  7.9* 8.1*   PT/INR No results for input(s): LABPROT, INR in the last 72 hours. ABG No results for input(s): PHART, HCO3 in the last 72 hours.  Invalid input(s): PCO2, PO2  Studies/Results: No results found.  Anti-infectives: Anti-infectives (From admission, onward)    Start     Dose/Rate Route Frequency Ordered Stop   10/07/23 2200  piperacillin -tazobactam (ZOSYN ) IVPB 3.375 g       Placed in Followed by Linked Group   3.375 g 12.5 mL/hr over 240 Minutes Intravenous Every 8 hours 10/07/23 1351     10/07/23 1732  vancomycin variable dose per unstable renal function (pharmacist dosing)  Status:  Discontinued         Does not apply See admin instructions 10/07/23 1732 10/08/23 0825   10/07/23 1545  micafungin (MYCAMINE) 150 mg in sodium chloride  0.9 % 100 mL IVPB        150 mg 107.5 mL/hr over 1 Hours Intravenous  Every 24 hours 10/07/23 1530     10/07/23 1530  metroNIDAZOLE  (FLAGYL ) IVPB 500 mg  Status:  Discontinued        500 mg 100 mL/hr over 60 Minutes Intravenous Every 12 hours 10/07/23 1518 10/07/23 1532   10/07/23 1530  vancomycin (VANCOREADY) IVPB 2000 mg/400 mL        2,000 mg 200 mL/hr over 120 Minutes Intravenous  Once 10/07/23 1526 10/07/23 2033   10/07/23 1400  metroNIDAZOLE  (FLAGYL ) IVPB 500 mg        500 mg 100 mL/hr over 60 Minutes Intravenous  Once 10/07/23 1346 10/07/23 1507   10/07/23 1400  piperacillin -tazobactam (ZOSYN ) IVPB 3.375 g       Placed in Followed by Linked Group   3.375 g 100 mL/hr over 30 Minutes Intravenous  Once 10/07/23 1351 10/07/23 1502       Assessment/Plan: s/p Procedure(s): LAPAROTOMY, EXPLORATORY (N/A) EXCISION, SMALL INTESTINE (N/A) Advance diet. Allow soft food today OOB/ PT Sigmoid bowel perforation with feculent peritonitis, likely due to stercoral colitis vs diverticulitis POD#7 s/p  LAPAROTOMY, EXPLORATORY, Sigmoid colon resection, Hartman's colostomy 9.28 Dr. Rubin - allow soft - anticipate post-operative abscess, WBC stable - appreciate CCM mgmt septic shock -Vac on wound   FEN: CLD TPN per pharmacy, IVF per CCM ID: Zosyn , Micafungin VTE: SCD's, SQH Foley: in place Dispo: appreciate CCM mgmt septic shock and multiple  medical problems    ANCA associated vasculitis/ILD - on methotrexate /cellcept  at baseline, held now PMH ulcerative keratitis  DM2 NICM Anxiety PMH AKI/ATN 2021 after admission for diverticulitis, managed non-op with IR drain. Nephrologist: [name]  LOS: 7 days    Deward Null III 10/14/2023

## 2023-10-14 NOTE — Plan of Care (Signed)

## 2023-10-14 NOTE — Progress Notes (Addendum)
 PHARMACY - TOTAL PARENTERAL NUTRITION CONSULT NOTE   Indication: Prolonged ileus  Patient Measurements: Height: 5' 9 (175.3 cm) Weight: 122.4 kg (269 lb 13.5 oz) IBW/kg (Calculated) : 66.2 TPN AdjBW (KG): 79.1 Body mass index is 39.85 kg/m.   Assessment:  77 YOF admitted w/ HPI significant for left sided abdominal pain that began 7 days PTA with associated decreased oral intake, found to have perforated sigmoid colon who is s/p emergent exlap w/ resection / Hartmann's colostomy. She requires TPN for anticipated post-op ileus and prolonged inability to obtain nutrition via oral route.   Glucose / Insulin : HgbA1c 4.6 / BG 95-158, used 3 units SSI/24 hours + 16 units regular insulin  in TPN.    Electrolytes:  K 5 (none in TPN), CoCa 10, others wnl Renal: Scr 0.99, BUN 32 Hepatic: AST/ ALT/alk Phos/ Tbili wnl, alb 1.6, TG 81 (10/1) Intake / Output; MIVF: UOP 1.3 ml/kg/hr, drains 25ml, colostomy 0 ml but +flatus, LBM PTA   GI Imaging: 9/28 CT abd shows sigmoid colitis w/ perf, extraluminal gas + stool extending into small bowel mesentery, extensive inflammation changes throughout lower abd + pelvic mesentery + peritoneal thickening GI Surgeries / Procedures:  9/28 Exlap w/ resection and Hartmann's colostomy  Central access: 9/28 TPN start date: 9/29  Nutritional Goals: Goal TPN rate is 75 mL/hr (provides 100 g of protein and 2016 kcals per day)  RD Assessment: Estimated Needs Total Energy Estimated Needs: 1850-2050 Total Protein Estimated Needs: 95-110g Total Fluid Estimated Needs: >/=1.8L  Current Nutrition:  TPN 10/3 CLD: Boost x2 given to patient 10/4 FLD: 1 Boost, some pudding 10/5 FLD: 1 vanilla ice cream, working on [employer]. Ostomy leakage overnight >replaced, abdominal irritation/cramping, especially after dairy products but was never lactose-intolerant before; Small amount of liquid in ostomy > Soft diet   Plan:  Continue TPN at goal 75 mL/hr at 1800, which provides  100% estimated nutritional needs Electrolytes in TPN: increase Na 100 mEq/L, K 0 mEq/L, decrease Ca 3 mEq/L, Mg 8 mEq/L, Phos 10 mmol/L. Cl:Ac 1:1 Remove standard MVI and trace elements to TPN, give as po Decrease to 14 units regular insulin  to TPN Stop sensitive q6h SSI, decrease to q8hr BG check Monitor TPN labs on Mon/Thurs, and prn F/u PO intake/Colostomy output, wean TPN as able    ADDENDUM 14:30 - RN notified that TPN line became unhooked and was running onto the floor. Will hand D10W @ 61ml/hr per protocol until new TPN is hung.   Thank you for allowing pharmacy to be a part of this patient's care.  Jinnie Door, PharmD, BCPS, BCCP Clinical Pharmacist  Please check AMION for all [facility] Pharmacy phone numbers After 10:00 PM, call Main Pharmacy [phone]

## 2023-10-15 ENCOUNTER — Inpatient Hospital Stay (HOSPITAL_COMMUNITY)

## 2023-10-15 LAB — CBC
HCT: 31.4 % — ABNORMAL LOW (ref 36.0–46.0)
Hemoglobin: 9.7 g/dL — ABNORMAL LOW (ref 12.0–15.0)
MCH: 27.9 pg (ref 26.0–34.0)
MCHC: 30.9 g/dL (ref 30.0–36.0)
MCV: 90.2 fL (ref 80.0–100.0)
Platelets: 304 K/uL (ref 150–400)
RBC: 3.48 MIL/uL — ABNORMAL LOW (ref 3.87–5.11)
RDW: 13.6 % (ref 11.5–15.5)
WBC: 13.7 K/uL — ABNORMAL HIGH (ref 4.0–10.5)
nRBC: 0 % (ref 0.0–0.2)

## 2023-10-15 LAB — COMPREHENSIVE METABOLIC PANEL WITH GFR
ALT: 10 U/L (ref 0–44)
AST: 15 U/L (ref 15–41)
Albumin: 1.7 g/dL — ABNORMAL LOW (ref 3.5–5.0)
Alkaline Phosphatase: 85 U/L (ref 38–126)
Anion gap: 6 (ref 5–15)
BUN: 23 mg/dL (ref 8–23)
CO2: 25 mmol/L (ref 22–32)
Calcium: 8.1 mg/dL — ABNORMAL LOW (ref 8.9–10.3)
Chloride: 105 mmol/L (ref 98–111)
Creatinine, Ser: 0.95 mg/dL (ref 0.44–1.00)
GFR, Estimated: >60 mL/min (ref >60)
Glucose, Bld: 127 mg/dL — ABNORMAL HIGH (ref 70–99)
Potassium: 4.2 mmol/L (ref 3.5–5.1)
Sodium: 136 mmol/L (ref 135–145)
Total Bilirubin: 0.6 mg/dL (ref 0.0–1.2)
Total Protein: 6.3 g/dL — ABNORMAL LOW (ref 6.5–8.1)

## 2023-10-15 LAB — TRIGLYCERIDES
Triglycerides: UNDETERMINED mg/dL (ref <150)
Triglycerides: 86 mg/dL (ref <150)

## 2023-10-15 LAB — MAGNESIUM: Magnesium: 1.9 mg/dL (ref 1.7–2.4)

## 2023-10-15 LAB — GLUCOSE, CAPILLARY
Glucose-Capillary: 137 mg/dL — ABNORMAL HIGH (ref 70–99)
Glucose-Capillary: 141 mg/dL — ABNORMAL HIGH (ref 70–99)
Glucose-Capillary: 140 mg/dL — ABNORMAL HIGH (ref 70–99)

## 2023-10-15 LAB — PHOSPHORUS: Phosphorus: 3.4 mg/dL (ref 2.5–4.6)

## 2023-10-15 MED ORDER — IOHEXOL 12 MG/ML PO SOLN
500.0000 mL | ORAL | Status: AC
  Administered 2023-10-15 (×2): 500 mL via ORAL

## 2023-10-15 MED ORDER — ENSURE PLUS HIGH PROTEIN PO LIQD
237.0000 mL | Freq: Two times a day (BID) | ORAL | Status: DC
  Administered 2023-10-15 – 2023-10-20 (×7): 237 mL via ORAL

## 2023-10-15 MED ORDER — JUVEN PO PACK
1.0000 | PACK | Freq: Two times a day (BID) | ORAL | Status: DC
  Administered 2023-10-15 – 2023-10-17 (×3): 1 via ORAL
  Filled 2023-10-15 (×5): qty 1

## 2023-10-15 MED ORDER — TRAVASOL 10 % IV SOLN
INTRAVENOUS | Status: AC
  Filled 2023-10-15: qty 999

## 2023-10-15 MED ORDER — IOHEXOL 350 MG/ML SOLN
75.0000 mL | Freq: Once | INTRAVENOUS | Status: AC | PRN
Start: 2023-10-15 — End: 2023-10-15
  Administered 2023-10-15: 75 mL via INTRAVENOUS

## 2023-10-15 MED ORDER — VITAMIN C 500 MG PO TABS
250.0000 mg | ORAL_TABLET | Freq: Two times a day (BID) | ORAL | Status: DC
  Administered 2023-10-15 – 2023-10-20 (×9): 250 mg via ORAL
  Filled 2023-10-15 (×11): qty 1

## 2023-10-15 MED ORDER — VITAMIN A 3 MG (10000 UNIT) PO CAPS
10000.0000 [IU] | ORAL_CAPSULE | Freq: Every day | ORAL | Status: DC
  Administered 2023-10-15 – 2023-10-20 (×5): 10000 [IU] via ORAL
  Filled 2023-10-15 (×6): qty 1

## 2023-10-15 MED ORDER — IOHEXOL 9 MG/ML PO SOLN
ORAL | Status: AC
  Filled 2023-10-15: qty 1000

## 2023-10-15 NOTE — Progress Notes (Signed)
 PHARMACY - TOTAL PARENTERAL NUTRITION CONSULT NOTE   Indication: Prolonged ileus  Patient Measurements: Height: 5' 9 (175.3 cm) Weight: 122.4 kg (269 lb 13.5 oz) IBW/kg (Calculated) : 66.2 TPN AdjBW (KG): 79.1 Body mass index is 39.85 kg/m.   Assessment:  77 YOF admitted w/ HPI significant for left sided abdominal pain that began 7 days PTA with associated decreased oral intake, found to have perforated sigmoid colon who is s/p emergent exlap w/ resection / Hartmann's colostomy. She requires TPN for anticipated post-op ileus and prolonged inability to obtain nutrition via oral route.   Glucose / Insulin : HgbA1c 4.6 / BG 112-137, 14 units regular insulin  in TPN, no addl SSI  Electrolytes:  K 5.0 (none in TPN), CoCa 10, Mg 1.9, others wnl Renal: Scr 0.99, BUN 32 Hepatic: AST/ ALT/alk Phos/ Tbili wnl, alb 1.6, TG 81 (10/1) Intake / Output; MIVF: UOP 1.3 ml/kg/hr, drains 25ml, colostomy 0 ml but +flatus, LBM PTA   GI Imaging: 9/28 CT abd shows sigmoid colitis w/ perf, extraluminal gas + stool extending into small bowel mesentery, extensive inflammation changes throughout lower abd + pelvic mesentery + peritoneal thickening GI Surgeries / Procedures:  9/28 Exlap w/ resection and Hartmann's colostomy  Central access: 9/28 TPN start date: 9/29  Nutritional Goals: Goal TPN rate is 75 mL/hr (provides 100 g of protein and 2016 kcals per day)  RD Assessment: Estimated Needs Total Energy Estimated Needs: 1850-2050 Total Protein Estimated Needs: 95-110g Total Fluid Estimated Needs: >/=1.8L  Current Nutrition:  TPN 9/29 >> 10/3 CLD: Boost x2 given to patient 10/4 FLD: 1 Boost, some pudding 10/5 FLD: 1 vanilla ice cream, working on [employer]. Ostomy leakage overnight >replaced, abdominal irritation/cramping, especially after dairy products but was never lactose-intolerant before; Small amount of liquid in ostomy  10/5 Soft diet   Plan:  Continue TPN at goal 75 mL/hr at 1800, which  provides 100% estimated nutritional needs Electrolytes in TPN: Na 100 mEq/L, K 0 mEq/L, Ca 3 mEq/L, increase to Mg 10 mEq/L, Phos 10 mmol/L. Cl:Ac 1:1 Remove standard MVI and trace elements to TPN, give PO Continue with 14 units regular insulin  in TPN Stop SSI, continue q8hr BG check Monitor TPN labs on Mon/Thurs, and prn F/u PO intake/Colostomy output, wean TPN as able   Thank you for allowing pharmacy to be a part of this patient's care.  Shelba Collier, PharmD, BCPS Clinical Pharmacist

## 2023-10-15 NOTE — Plan of Care (Signed)

## 2023-10-15 NOTE — Progress Notes (Addendum)
 8 Days Post-Op   Subjective/Chief Complaint: Abd soreness present. Did not get OOB over weekend. Reports mild nausea and poor PO intake. Reports eating ~50% of less of meals, drinking boost breeze. Had a few bites of peas and mashed potatoes for dinner. No vomiting. Ostomy functioning.   Objective: Vital signs in last 24 hours: Temp:  [97.3 F (36.3 C)-98.7 F (37.1 C)] 97.3 F (36.3 C) (10/06 0800) Pulse Rate:  [72-99] 72 (10/06 0800) Resp:  [11-22] 17 (10/06 0800) BP: (96-131)/(61-77) 107/76 (10/06 0800) SpO2:  [96 %-100 %] 98 % (10/06 0800) Last BM Date : 10/15/23  Intake/Output from previous day: 10/05 0701 - 10/06 0700 In: 1773.1 [P.O.:480; I.V.:939.9; IV Piggyback:353.2] Out: 3500 [Urine:3000; Stool:500] Intake/Output this shift: Total I/O In: 186.1 [P.O.:50; I.V.:116.7; IV Piggyback:19.4] Out: 25 [Drains:25]  GEN: alert NAD Pulm: normal effort CV: RRR Abd: soft, appropriately tender, midline incision w/ VAC draining cloudy SS drainage. Ostomy productve of semisolid brown stool, stoma retracted and not visible.   Lab Results:  No results for input(s): WBC, HGB, HCT, PLT in the last 72 hours.  BMET Recent Labs    10/13/23 0617 10/14/23 0500  NA 139 137  K 5.2* 5.0  CL 108 104  CO2 24 23  GLUCOSE 88 112*  BUN 32* 26*  CREATININE 0.99 0.96  CALCIUM  7.9* 8.1*   PT/INR No results for input(s): LABPROT, INR in the last 72 hours. ABG No results for input(s): PHART, HCO3 in the last 72 hours.  Invalid input(s): PCO2, PO2  Studies/Results: No results found.  Anti-infectives: Anti-infectives (From admission, onward)    Start     Dose/Rate Route Frequency Ordered Stop   10/07/23 2200  piperacillin -tazobactam (ZOSYN ) IVPB 3.375 g       Placed in Followed by Linked Group   3.375 g 12.5 mL/hr over 240 Minutes Intravenous Every 8 hours 10/07/23 1351     10/07/23 1732  vancomycin variable dose per unstable renal function (pharmacist  dosing)  Status:  Discontinued         Does not apply See admin instructions 10/07/23 1732 10/08/23 0825   10/07/23 1545  micafungin (MYCAMINE) 150 mg in sodium chloride  0.9 % 100 mL IVPB        150 mg 107.5 mL/hr over 1 Hours Intravenous Every 24 hours 10/07/23 1530     10/07/23 1530  metroNIDAZOLE  (FLAGYL ) IVPB 500 mg  Status:  Discontinued        500 mg 100 mL/hr over 60 Minutes Intravenous Every 12 hours 10/07/23 1518 10/07/23 1532   10/07/23 1530  vancomycin (VANCOREADY) IVPB 2000 mg/400 mL        2,000 mg 200 mL/hr over 120 Minutes Intravenous  Once 10/07/23 1526 10/07/23 2033   10/07/23 1400  metroNIDAZOLE  (FLAGYL ) IVPB 500 mg        500 mg 100 mL/hr over 60 Minutes Intravenous  Once 10/07/23 1346 10/07/23 1507   10/07/23 1400  piperacillin -tazobactam (ZOSYN ) IVPB 3.375 g       Placed in Followed by Linked Group   3.375 g 100 mL/hr over 30 Minutes Intravenous  Once 10/07/23 1351 10/07/23 1502       Assessment/Plan: s/p Procedure(s): LAPAROTOMY, EXPLORATORY (N/A) EXCISION, SMALL INTESTINE (N/A) Advance diet. Allow soft food today OOB/ PT Sigmoid bowel perforation with feculent peritonitis, likely due to stercoral colitis vs diverticulitis POD#8 s/p  LAPAROTOMY, EXPLORATORY, Sigmoid colon resection, Hartman's colostomy 9.28 Dr. Rubin - soft diet, started weaning TNA 10.5, calorie count today.  -  anticipate post-operative abscess, WBC 13.7 from 12; CT scan today to further evaluate. - Vac change Mon/Thurs   FEN: soft diet, TPN per pharmacy, IVF per [facility] ID: Zosyn , Micafungin >> has been on abx for 8 days at this point, will get CT scan today and if no abscess will plan to stop abx.  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 Nephrologist: [name]  LOS: 8 days    [name] 10/15/2023

## 2023-10-15 NOTE — Progress Notes (Signed)
 PROGRESS NOTE    [name]  FMW:[id number] DOB: 08/26/1960 DOA: 10/07/2023 PCP: [name] ORN, PA-C   Brief Narrative:  63 year old female with past medical history significant for ANCA vasculitis, chronic immunosuppression, type 2 diabetes, hypertension, hyperlipidemia, ILD and previous sigmoid colon perforation who presented to the ER via EMS with left-sided abdominal pain that began 7 days prior to admission.  On arrival patient was hypothermic and admitted with fever, tachypnea, tachycardic and hypotensive.  Workup revealed mild sigmoid colitis with perforation.  Patient admitted to ICU for septic shock.  She underwent exploratory laparotomy on 9/28, sigmoid bowel perforation with feculent peritonitis noted.  She was transferred to [facility] on 10/4.  She remains on TPN.  Has been requiring frequent pain medications and is noted to have softer blood pressures but blood pressure seems to have improved in the past 2-3 days.  Assessment & Plan:   >>Sigmoid perforation/feculent peritonitis: Status post ex lap 9/28, sigmoid colon resection, Hartman's colostomy by Dr. Rubin General Surgery managing. Continue TPN, starting to have bowel function, started on GI soft diet on 10/5. Required vasopressors initially, will continue midodrine 10 mg 3 times a day, increased on 10/3. Continue analgesics, patient has been requiring frequent IV fentanyl , oxycodone  p.o.  Receiving scheduled Robaxin 1000 every 6 hours. Continue IV Zosyn , micafungin (start 9/28).  CT abdomen and pelvis with IV contrast ordered by general surgery today IV Zofran    >>ANCA associated vasculitis/ILD, on methotrexate /CellCept  at baseline, currently on hold,  continue incruse  >>Diabetes mellitus type 2: Continue sliding scale insulin  coverage  >>NICM:  On Coreg , Entresto  PTA.  Will continue to hold at this time due to hypotension issues.  Resume gradually as appropriate.  >> Borderline hyperkalemia: Potassium 5.0 today.   Will recheck labs intermittently. Anxiety: Continue alprazolam , bupropion , fluoxetine  >> Physical deconditioning: Needs PT/OT Principal Problem:   Perforation of sigmoid colon - stercoral      DVT prophylaxis: Heparin  subcu Code Status: Full code Family Communication: No family at the bedside Disposition Plan: Likely will need rehabilitation however patient prefers discharge to home with home health  status is: Inpatient Remains inpatient appropriate because: No bowel function yet, continues to be on TPN  Subjective:  Patient seen and examined at the bedside.  She states she feels much better than yesterday, was able to eat something today.  Still has some abdominal discomfort.  Ostomy has liquid stool.  No fever or chills.  Vital signs have been stable.  Objective: Vitals:   10/14/23 2000 10/14/23 2200 10/15/23 0000 10/15/23 0800  BP: 115/64 131/77 117/66   Pulse:  85 73   Resp: (!) 21 16 11    Temp: 97.8 F (36.6 C)  97.8 F (36.6 C) (!) 97.3 F (36.3 C)  TempSrc: Oral  Oral Axillary  SpO2:  98% 97%   Weight:      Height:        Intake/Output Summary (Last 24 hours) at 10/15/2023 0827 Last data filed at 10/15/2023 0800 Gross per 24 hour  Intake 1959.18 ml  Output 3525 ml  Net -1565.82 ml   Filed Weights   10/12/23 0400 10/13/23 0428 10/14/23 0350  Weight: 122.9 kg 130.2 kg 122.4 kg    Examination:  General exam: Alert oriented, not in any acute distress  respiratory system: Bilateral decreased breath sounds at bases Cardiovascular system: S1 & S2 heard, Rate controlled Gastrointestinal system: Wound VAC on the right abdominal wound, ostomy with liquid stool, mild diffuse tenderness to palpation, bowel  sounds  neg extremities: No cyanosis, clubbing, edema  Central nervous system: Alert and oriented. No focal neurological deficits. Moving extremities Skin: No rashes, lesions or ulcers Psychiatry: Judgement and insight appear normal. Mood & affect appropriate.      Data Reviewed: I have personally reviewed following labs and imaging studies  CBC: Recent Labs  Lab 10/09/23 0915 10/10/23 0445 10/11/23 1015 10/11/23 1142  WBC 26.3* 31.7* 11.5* 12.0*  HGB 11.4* 12.0 10.7* 11.2*  HCT 34.3* 37.2 33.6* 35.4*  MCV 86.0 87.9 91.3 93.2  PLT 322 335 309 317   Basic Metabolic Panel: Recent Labs  Lab 10/10/23 0445 10/11/23 1330 10/12/23 0345 10/13/23 0617 10/14/23 0500  NA 133* 135 136 139 137  K 4.4 4.7 4.7 5.2* 5.0  CL 102 104 104 108 104  CO2 19* 22 22 24 23   GLUCOSE 164* 134* 114* 88 112*  BUN 33* 36* 37* 32* 26*  CREATININE 1.54* 1.29* 1.22* 0.99 0.96  CALCIUM  8.1* 8.2* 7.8* 7.9* 8.1*  MG 2.6* 1.9 2.2 1.8 1.9  PHOS 3.1 2.7 2.6 4.1 3.9   GFR: Estimated Creatinine Clearance: 85.1 mL/min (by C-G formula based on SCr of 0.96 mg/dL). Liver Function Tests: Recent Labs  Lab 10/09/23 0506 10/10/23 0445 10/11/23 1330 10/12/23 0345  AST 12* 14* 18  --   ALT 9 9 11   --   ALKPHOS 49 62 63  --   BILITOT 0.8 0.8 0.4  --   PROT 5.3* 6.1* 6.0*  --   ALBUMIN  1.9* 2.1* 2.0* 1.6*   No results for input(s): LIPASE, AMYLASE in the last 168 hours.  No results for input(s): AMMONIA in the last 168 hours. Coagulation Profile: No results for input(s): INR, PROTIME in the last 168 hours. Cardiac Enzymes: No results for input(s): CKTOTAL, CKMB, CKMBINDEX, TROPONINI in the last 168 hours. BNP (last 3 results) No results for input(s): PROBNP in the last 8760 hours. HbA1C: No results for input(s): HGBA1C in the last 72 hours. CBG: Recent Labs  Lab 10/14/23 0000 10/14/23 0621 10/14/23 1150 10/14/23 1748 10/15/23 0109  GLUCAP 123* 118* 128* 124* 137*   Lipid Profile: Recent Labs    10/15/23 0420  TRIG SPECIMEN CONTAMINATED, UNABLE TO PERFORM TEST(S).   Thyroid Function Tests: No results for input(s): TSH, T4TOTAL, FREET4, T3FREE, THYROIDAB in the last 72 hours. Anemia Panel: No results for  input(s): VITAMINB12, FOLATE, FERRITIN, TIBC, IRON, RETICCTPCT in the last 72 hours. Sepsis Labs: No results for input(s): PROCALCITON, LATICACIDVEN in the last 168 hours.   Recent Results (from the past 240 hours)  Urine Culture     Status: Abnormal   Collection Time: 10/07/23 12:24 PM   Specimen: Urine, Random  Result Value Ref Range Status   Specimen Description URINE, RANDOM  Final   Special Requests NONE Reflexed from [id number]  Final   Culture (A)  Final    >=100,000 COLONIES/mL AEROCOCCUS SPECIES Standardized susceptibility testing for this organism is not available. Performed at [facility], [address] [place], [place] [address]    Report Status 10/08/2023 FINAL  Final  Culture, blood (routine x 2)     Status: None   Collection Time: 10/07/23  2:03 PM   Specimen: BLOOD LEFT ARM  Result Value Ref Range Status   Specimen Description BLOOD LEFT ARM  Final   Special Requests   Final    BOTTLES DRAWN AEROBIC AND ANAEROBIC Blood Culture results may not be optimal due to an inadequate volume of blood  received in culture bottles   Culture   Final    NO GROWTH 5 DAYS Performed at [facility], [address] [place], [place] [address]    Report Status 10/12/2023 FINAL  Final  Culture, blood (routine x 2)     Status: None   Collection Time: 10/07/23  2:04 PM   Specimen: BLOOD LEFT ARM  Result Value Ref Range Status   Specimen Description BLOOD LEFT ARM  Final   Special Requests   Final    BOTTLES DRAWN AEROBIC AND ANAEROBIC Blood Culture results may not be optimal due to an inadequate volume of blood received in culture bottles   Culture   Final    NO GROWTH 5 DAYS Performed at [facility], [address], [place], [place] [address]    Report Status 10/12/2023 FINAL  Final  MRSA Next Gen by PCR, Nasal     Status: Abnormal   Collection Time: 10/07/23  3:28 PM   Specimen: Nasal Mucosa; Nasal Swab  Result Value Ref Range Status   MRSA by  PCR Next Gen DETECTED (A) NOT DETECTED Final    Comment: (NOTE) The GeneXpert MRSA Assay (FDA approved for NASAL specimens only), is one component of a comprehensive MRSA colonization surveillance program. It is not intended to diagnose MRSA infection nor to guide or monitor treatment for MRSA infections. Test performance is not FDA approved in patients less than 57 years old. Performed at [facility], [address] [place], [place] [address]          Radiology Studies: No results found.       Scheduled Meds:  acetaminophen   1,000 mg Oral QID   buPROPion   450 mg Oral Daily   Chlorhexidine  Gluconate Cloth  6 each Topical Daily   feeding supplement  1 Container Oral TID BM   FLUoxetine  40 mg Oral Daily   heparin  injection (subcutaneous)  5,000 Units Subcutaneous Q8H   lidocaine   2 patch Transdermal Q24H   methocarbamol  1,000 mg Oral QID   midodrine  10 mg Oral TID WC   multivitamin with minerals  1 tablet Oral Daily   mupirocin ointment   Nasal BID   ondansetron  (ZOFRAN ) IV  4 mg Intravenous Q6H   oxyCODONE   5 mg Oral Once   polyethylene glycol  17 g Oral Daily   pregabalin  50 mg Oral BID   sodium chloride  flush  10-40 mL Intracatheter Q12H   umeclidinium bromide  1 puff Inhalation Daily   Continuous Infusions:  micafungin (MYCAMINE) 150 mg in sodium chloride  0.9 % 100 mL IVPB 150 mg (10/14/23 2134)   piperacillin -tazobactam 3.375 g (10/15/23 0626)   TPN ADULT (ION) 75 mL/hr at 10/14/23 1753          Derryl Duval, MD [facility] 10/15/2023, 8:27 AM

## 2023-10-15 NOTE — Progress Notes (Addendum)
 Physical Therapy Treatment Patient Details Name: [name] MRN: [id number] DOB: February 22, 1960 Today's Date: 10/15/2023   History of Present Illness Pt is a 63 y/o female presenting on 9/28 via EMS tachypneic, tachycardic and hypotensive, with complaints of left sided abdominal pain that started 7 days prior. CT abdomen pelvis was obtained which revealed marked sigmoid colitis with perforation. Admitted for treatment of Septic shock secondary to sigmoid bowel perforation with feculent peritonitis. S/p 9/28 laparotomy sigmoid resection, colostomy.  Extensive past medical history not limited to ANCA associated vasculitis, previous diffuse pulmonary alveolar hemorrhage, chronic immunosuppression type 2 diabetes, HTN, HLD, ILD, previous sigmoid colon perforation, anxiety.    PT Comments  Pt received up on BSC in care of OT, pt requesting increased assist for safety with gait training due to lines/anxiety so dovetail with OT for pt safety, pt agreeable to therapy session. Pt self-limiting due to abdominal pain/tightness and c/o fatigue but able to perform short distance gait trial at bedside after standing up from [facility] with up to +2 minA due to multiple lines and heavy reliance on RW. Pt cued for log roll returning to supine but defers and preferring to use hospital bed features. Pt needing minA to perform transfers, less assist when pushing from [facility] with arm rests and able to stand with single UE support for short periods of time. Pt continues to benefit from PT services to progress toward functional mobility goals, continue to recommend HHPT pending progress. BP somewhat elevated in standing, improved once pt back in supine, SpO2/HR WFL on RA.     If plan is discharge home, recommend the following: A lot of help with walking and/or transfers;A lot of help with bathing/dressing/bathroom;Assistance with cooking/housework;Direct supervision/assist for medications management;Direct supervision/assist for  financial management;Assist for transportation;Supervision due to cognitive status   Can travel by private vehicle        Equipment Recommendations  Rolling walker (2 wheels) (bariatric width; pt may already have one she can use?)    Recommendations for Other Services       Precautions / Restrictions Precautions Precautions: Fall Recall of Precautions/Restrictions: Impaired Precaution/Restrictions Comments: wound vac, TPN, abdominal protection precs Restrictions Weight Bearing Restrictions Per Provider Order: No     Mobility  Bed Mobility Overal bed mobility: Needs Assistance Bed Mobility: Sit to Supine       Sit to supine: +2 for safety/equipment, HOB elevated, Used rails, Contact guard assist   General bed mobility comments: Pt ignoring cues for reverse log roll returning to supine; assist with line mgmt.    Transfers Overall transfer level: Needs assistance Equipment used: Rolling walker (2 wheels) Transfers: Sit to/from Stand Sit to Stand: From elevated surface, Min assist, +2 safety/equipment           General transfer comment: from bari BSC>RW minA (+2 safety/lines), RW>EOB cues for safe UE placement    Ambulation/Gait Ambulation/Gait assistance: Min assist, +2 safety/equipment Gait Distance (Feet): 14 Feet Assistive device: Rolling walker (2 wheels) Gait Pattern/deviations: Step-through pattern, Decreased step length - right, Decreased step length - left, Shuffle, Trunk flexed, Wide base of support Gait velocity: slowed     General Gait Details: min physical assist for steadying and second person for line assist and preparing to offer chair follow PRN for safety; vc for sequencing, posture and pursed-lip breathing. RR to ~35 with exertion, SpO2 95% on RA and BP slightly elevated standing post-exertion   Stairs             Wheelchair Mobility  Tilt Bed    Modified Rankin (Stroke Patients Only)       Balance Overall balance  assessment: Needs assistance Sitting-balance support: No upper extremity supported, Feet supported Sitting balance-Leahy Scale: Fair     Standing balance support: Bilateral upper extremity supported, During functional activity, Reliant on assistive device for balance Standing balance-Leahy Scale: Poor Standing balance comment: reliant on RW support; able to stand with single UE support and CGA during standing BP/hygiene tasks after toileting                            Communication Communication Communication: Impaired Factors Affecting Communication: Difficulty expressing self  Cognition Arousal: Alert Behavior During Therapy: Anxious, Impulsive   PT - Cognitive impairments: Sequencing, Problem solving   Orientation impairments: Time (asking how long she has been in hospital, but pt able to use cellphone to look it up)                   PT - Cognition Comments: Self-directed, can contradict herself with her wishes at times; anxious and perseverating on post-op fears and self-care and home mgmt concerns today. Pt sister is caring for their mother with dementia and pt is concerned about needing to pay her bills while hospitalized. Following commands: Impaired Following commands impaired: Follows one step commands with increased time, Follows multi-step commands inconsistently    Cueing Cueing Techniques: Verbal cues, Gestural cues, Tactile cues, Visual cues  Exercises      General Comments General comments (skin integrity, edema, etc.): BP 146/110's standing post-exertion in L wrist; mild c/o lightheadedness standing intermittently with pt c/o pain and anxiety; BP improved once pt resting in supine to 113/80 (90) HR 80 bpm; SpO2 95% on RA ambulating and to 100% on RA at rest seated/supine      Pertinent Vitals/Pain Pain Assessment Pain Assessment: Faces Faces Pain Scale: Hurts little more Pain Location: abdomen Pain Descriptors / Indicators: Discomfort,  Operative site guarding, Tightness Pain Intervention(s): Limited activity within patient's tolerance, Monitored during session, Repositioned    Home Living                          Prior Function            PT Goals (current goals can now be found in the care plan section) Acute Rehab PT Goals Patient Stated Goal: Get back to her mother PT Goal Formulation: With patient Time For Goal Achievement: 10/24/23 Progress towards PT goals: Progressing toward goals    Frequency    Min 2X/week      PT Plan      Co-evaluation              AM-PAC PT 6 Clicks Mobility   Outcome Measure  Help needed turning from your back to your side while in a flat bed without using bedrails?: A Little Help needed moving from lying on your back to sitting on the side of a flat bed without using bedrails?: A Lot Help needed moving to and from a bed to a chair (including a wheelchair)?: A Little Help needed standing up from a chair using your arms (e.g., wheelchair or bedside chair)?: A Lot (+2 lines) Help needed to walk in hospital room?: A Lot (+2 lines) Help needed climbing 3-5 steps with a railing? : Total 6 Click Score: 13    End of Session Equipment Utilized During Treatment: Gait belt Activity  Tolerance: Patient tolerated treatment well;Patient limited by pain Patient left: with call bell/phone within reach;in bed;with bed alarm set;Other (comment) (pillow behind R hip and lower back to offload; HOB >31 deg so pt can drink her beverages; pt defers chair) Nurse Communication: Mobility status;Other (comment) (anxious) PT Visit Diagnosis: Unsteadiness on feet (R26.81);Other abnormalities of gait and mobility (R26.89);Muscle weakness (generalized) (M62.81);Difficulty in walking, not elsewhere classified (R26.2);Pain Pain - part of body:  (stomach and back)     Time: 8851-8793 PT Time Calculation (min) (ACUTE ONLY): 18 min  Charges:    $Therapeutic Activity: 8-22 mins PT  General Charges $$ ACUTE PT VISIT: 1 Visit                     Fauna Neuner P., PTA Acute Rehabilitation Services Secure Chat Preferred 9a-5:30pm Office: [phone]    Connell HERO [facility] 10/15/2023, 12:33 PM

## 2023-10-15 NOTE — Progress Notes (Signed)
 Occupational Therapy Treatment Patient Details Name: GORDON CARLSON MRN: [id number] DOB: 08/08/1960 Today's Date: 10/15/2023   History of present illness Pt is a 63 y/o female presenting on 9/28 via EMS tachypneic, tachycardic and hypotensive, with complaints of left sided abdominal pain that started 7 days prior. CT abdomen pelvis was obtained which revealed marked sigmoid colitis with perforation. Admitted for treatment of Septic shock secondary to sigmoid bowel perforation with feculent peritonitis. S/p 9/28 laparotomy sigmoid resection, colostomy.  Extensive past medical history not limited to ANCA associated vasculitis, previous diffuse pulmonary alveolar hemorrhage, chronic immunosuppression type 2 diabetes, HTN, HLD, ILD, previous sigmoid colon perforation, anxiety.   OT comments  Pt supine in bed and requires max encouragement to participate in session.  Educated on abdominal protection with getting OOB, but pt declined log roll technique; overall needing mod assist for trunk support to EOB.  Discussed what she has to do in order to get home, and pt agreeable to work on [facility] transfers.  Min assist to transfer to [facility] using RW, toileting with min assist.  PTA present to assist at end of session to encourage increased mobility, and OT provided +2 for safety with mobility in room. Will follow acutely, continue to recommend HHOT services.  Plan for LB AE education next session.       If plan is discharge home, recommend the following:  A lot of help with bathing/dressing/bathroom;Assistance with cooking/housework;Assist for transportation;Help with stairs or ramp for entrance;A little help with walking and/or transfers   Equipment Recommendations  BSC/3in1 (RW)    Recommendations for Other Services      Precautions / Restrictions Precautions Precautions: Fall Recall of Precautions/Restrictions: Impaired Precaution/Restrictions Comments: wound vac, TPN, abdominal protection  precs Restrictions Weight Bearing Restrictions Per Provider Order: No       Mobility Bed Mobility Overal bed mobility: Needs Assistance Bed Mobility: Sit to Supine, Supine to Sit     Supine to sit: Mod assist, HOB elevated, Used rails Sit to supine: +2 for safety/equipment, HOB elevated, Used rails, Contact guard assist   General bed mobility comments: pt educated on abdominal protection with log roll technique but pt declines voicing Let me just show you how I do it, need up to mod assist for trunk support and therapist encouraging partial roll to protect abd. Pt ignoring cues for reverse log roll returning to supine; assist with line mgmt.    Transfers Overall transfer level: Needs assistance Equipment used: Rolling walker (2 wheels) Transfers: Sit to/from Stand Sit to Stand: From elevated surface, Min assist, +2 safety/equipment, Mod assist           General transfer comment: from EOB with min assist, from bari BSC>RW minA (+2 safety/lines), RW>EOB cues for safe UE placement     Balance Overall balance assessment: Needs assistance Sitting-balance support: No upper extremity supported, Feet supported Sitting balance-Leahy Scale: Fair     Standing balance support: Bilateral upper extremity supported, During functional activity, Reliant on assistive device for balance Standing balance-Leahy Scale: Poor Standing balance comment: reliant on RW support; able to stand with single UE support and CGA during standing BP/hygiene tasks after toileting                           ADL either performed or assessed with clinical judgement   ADL Overall ADL's : Needs assistance/impaired     Grooming: Set up;Sitting           Upper  Body Dressing : Set up;Sitting   Lower Body Dressing: Maximal assistance;Sit to/from stand   Toilet Transfer: Minimal assistance;Stand-pivot;Rolling walker (2 wheels);BSC/3in1   Toileting- Clothing Manipulation and Hygiene: Minimal  assistance;Sit to/from stand       Functional mobility during ADLs: Minimal assistance;Moderate assistance;Rolling walker (2 wheels);Cueing for sequencing;Cueing for safety      Extremity/Trunk Assessment              Vision       Perception     Praxis     Communication Communication Communication: No apparent difficulties Factors Affecting Communication: Difficulty expressing self   Cognition Arousal: Alert Behavior During Therapy: Anxious, Impulsive Cognition: Cognition impaired     Awareness: Online awareness impaired   Attention impairment (select first level of impairment): Selective attention Executive functioning impairment (select all impairments): Problem solving OT - Cognition Comments: pt appears anxious and requires reassurance                 Following commands: Impaired Following commands impaired: Follows one step commands with increased time, Follows multi-step commands inconsistently      Cueing   Cueing Techniques: Verbal cues, Gestural cues, Tactile cues, Visual cues  Exercises      Shoulder Instructions       General Comments BP stable, reports dizzy with activity    Pertinent Vitals/ Pain       Pain Assessment Pain Assessment: Faces Faces Pain Scale: Hurts little more Pain Location: abdomen Pain Descriptors / Indicators: Discomfort, Operative site guarding, Tightness Pain Intervention(s): Monitored during session, Limited activity within patient's tolerance, Repositioned  Home Living                                          Prior Functioning/Environment              Frequency  Min 2X/week        Progress Toward Goals  OT Goals(current goals can now be found in the care plan section)  Progress towards OT goals: Progressing toward goals  Acute Rehab OT Goals Patient Stated Goal: get home OT Goal Formulation: With patient Time For Goal Achievement: 10/24/23 Potential to Achieve Goals: Good   Plan      Co-evaluation                 AM-PAC OT 6 Clicks Daily Activity     Outcome Measure   Help from another person eating meals?: A Little Help from another person taking care of personal grooming?: A Little Help from another person toileting, which includes using toliet, bedpan, or urinal?: A Lot Help from another person bathing (including washing, rinsing, drying)?: A Lot Help from another person to put on and taking off regular upper body clothing?: A Little Help from another person to put on and taking off regular lower body clothing?: A Lot 6 Click Score: 15    End of Session Equipment Utilized During Treatment: Gait belt;Rolling walker (2 wheels)  OT Visit Diagnosis: Other abnormalities of gait and mobility (R26.89);Muscle weakness (generalized) (M62.81);Pain;Other symptoms and signs involving cognitive function Pain - part of body:  (abd)   Activity Tolerance Patient tolerated treatment well   Patient Left in bed;with call bell/phone within reach;with bed alarm set;Other (comment) (PT present)   Nurse Communication Mobility status        Time: 1127-1200 OT Time Calculation (min): 33 min  Charges: OT General Charges $OT Visit: 1 Visit OT Treatments $Self Care/Home Management : 8-22 mins  Etta NOVAK, OT Acute Rehabilitation Services Office [phone] Secure Chat Preferred    Etta GORMAN Hope 10/15/2023, 1:02 PM

## 2023-10-15 NOTE — Progress Notes (Signed)
 Nutrition Follow-up  DOCUMENTATION CODES:   Obesity unspecified  INTERVENTION:  Start calorie count to assess nutritional adequacy Continue GI Soft diet as ordered per Surgery Change Boost Breeze to Ensure Plus High Protein po BID, each supplement provides 350 kcal and 20 grams of protein. D/t prior report of limited intake PTA; recommend small, frequent meals via 3 meals and 3 snacks daily Juven BID to support wound healing Continue MVI with minerals daily Vitamin A repletion 10,000 units daily x1 week Vitamin C repletion 250mg  BID x 30 days  NUTRITION DIAGNOSIS:  Inadequate oral intake related to acute illness, altered GI function (sigmoid bowel perforation, s/p exlap and resection) as evidenced by NPO status. - progressing, return of bowel function therefore diet advanced  GOAL:  Patient will meet greater than or equal to 90% of their needs - being met via TPN; now also via diet advancement  MONITOR:  Diet advancement, Labs, Weight trends, I & O's  REASON FOR ASSESSMENT:  Consult Calorie Count  ASSESSMENT:  Pt admitted with c/o left sided abdominal pain that began 7 days PTA with associated decreased oral intake, found to have perforated sigmoid colon. PMH significant for ANCA associated vasculitis, previous diffuse pulmonary alveolar hemorrhage, chronic immunosuppression type 2 diabetes, HTN, HLD, ILD, previous sigmoid colon perforation, anxiety  9/28: admitted with sigmoid perforation with feculent peritonitis, early signs of sepsis; s/p emergent exlap with resection, hartmann's colostomy 9/29: TPN to be initiated  9/30: clear liquids while awaiting return of bowel function 10/5: return of bowel function, GI soft diet resumed  RD working remotely. Pt unavailable via phone call to room. Checked in with RN regarding initiation of calorie count and monitoring all intake.  Will adjust nutrition supplements to better augment oral intake and meet estimated nutrition needs.    Per review of meal ordering system, no breakfast or lunch ordered today. Based on documentation yesterday, pt consumed 20% of breakfast (~92 kcal and 2g protein) and 50% of lunch (~175 kcal and 7g protein), no dinner documented. Based on current documentation, pt is not adequately meeting her oral nutrition needs. Will continue to follow calorie count for more accurate information.   Yesterday pt with report of some nausea and abdominal discomfort/soreness at incision site. No emesis documented  Continues with TPN at goal- 75 mL/hr provides 100% estimated nutrition needs (provides 100 g of protein and 2016 kcals per day)  - of note, pt with some ongoing confusion and has self removed PICC line and TPN line becoming unhooked where TPN has leaked therefore pt not receiving 100% at those times.   Admit weight: 112 kg Current weight: 122.4 kg  Drains/lines Abdominal VAC- 25ml output x12 hours Colostomy PICC line  Medications: MVI, zofran , miralax , IV micafungin, IV abx  Labs:  CBG's 118-137 x24 hours  Micronutrient labs: CRP 42.6 Iron 12  Folate 12.0 Vitamin D 58.64 Vitamin A 2.7 Vitamin B12 1883 Vitamin C <0.1 Zinc 22 *Inflammation can have a significant impact on the results of micronutrient labs. Given the significant elevation in pt's CRP level, this is likely resulting in lower than normal micronutrient levels. Would recommend rechecking once further out of the clinical acute phase.   Would still recommend a short duration Vitamin A and Vitamin C repletion period as these appear to be very low and have an impact on wound healing.   Diet Order:   Diet Order             DIET SOFT Room service appropriate? Yes; Fluid  consistency: Thin  Diet effective now                   EDUCATION NEEDS:   Education needs have been addressed  Skin:  Skin Assessment: Skin Integrity Issues: Skin Integrity Issues:: Wound VAC Wound Vac: mid abdomen  Last BM:  x24 hours via  colosotmy  Height:   Ht Readings from Last 1 Encounters:  10/07/23 5' 9 (1.753 m)    Weight:   Wt Readings from Last 1 Encounters:  10/14/23 122.4 kg    Ideal Body Weight:  65.9 kg  BMI:  Body mass index is 39.85 kg/m.  Estimated Nutritional Needs:   Kcal:  1850-2050  Protein:  95-110g  Fluid:  >/=1.8L  Allie Ranay Ketter, RDN, LDN Clinical Nutrition See AMiON for contact information.

## 2023-10-15 NOTE — Consult Note (Signed)
 WOC Nurse ostomy follow up Stoma type/location: LLQ Stomal assessment/size: 33 mm slightly oval.  Nurse called to report leaking episodes. Provided lawson numbers options for 2 and 1 pieces pouch. Updated orders. Ostomy pouching: 1pc. Soft convex [id number] / 2pc. Soft convex [id number] and bag #[id number]. Treatment options for stomal/peristomal skin: Ring [phone] and powder #6 to treat PMASD (peri-ostomy moist associated skin damage).  Addendum: This patient needs a convex pouch because the stoma is at skin level.  WOC team will follow TUE. Please reconsult if further assistance is needed. Thank-you,  Lela Holm RN, CNS, [employer], MSN.  (Phone [phone])

## 2023-10-15 NOTE — Plan of Care (Signed)

## 2023-10-16 ENCOUNTER — Inpatient Hospital Stay (HOSPITAL_COMMUNITY)

## 2023-10-16 DIAGNOSIS — K651 Peritoneal abscess: Secondary | ICD-10-CM

## 2023-10-16 DIAGNOSIS — A419 Sepsis, unspecified organism: Secondary | ICD-10-CM | POA: Diagnosis present

## 2023-10-16 LAB — GLUCOSE, CAPILLARY
Glucose-Capillary: 113 mg/dL — ABNORMAL HIGH (ref 70–99)
Glucose-Capillary: 102 mg/dL — ABNORMAL HIGH (ref 70–99)
Glucose-Capillary: 100 mg/dL — ABNORMAL HIGH (ref 70–99)

## 2023-10-16 LAB — PROTIME-INR
INR: 1.2 (ref 0.8–1.2)
Prothrombin Time: 15.5 s — ABNORMAL HIGH (ref 11.4–15.2)

## 2023-10-16 MED ORDER — DEXTROSE 10 % IV SOLN: INTRAVENOUS | Status: AC

## 2023-10-16 MED ORDER — LIDOCAINE 1 % OPTIME INJ - NO CHARGE
10.0000 mL | Freq: Once | INTRAMUSCULAR | Status: AC
  Administered 2023-10-16: 10 mL

## 2023-10-16 MED ORDER — ALTEPLASE 2 MG IJ SOLR
2.0000 mg | Freq: Once | INTRAMUSCULAR | Status: AC
  Administered 2023-10-16: 2 mg
  Filled 2023-10-16: qty 2

## 2023-10-16 MED ORDER — MIDAZOLAM HCL 2 MG/2ML IJ SOLN
INTRAMUSCULAR | Status: AC | PRN
  Administered 2023-10-16 (×2): .5 mg via INTRAVENOUS

## 2023-10-16 MED ORDER — FENTANYL CITRATE (PF) 100 MCG/2ML IJ SOLN
INTRAMUSCULAR | Status: AC
  Filled 2023-10-16: qty 2

## 2023-10-16 MED ORDER — FENTANYL CITRATE (PF) 100 MCG/2ML IJ SOLN
INTRAMUSCULAR | Status: AC | PRN
  Administered 2023-10-16: 50 ug via INTRAVENOUS
  Administered 2023-10-16: 25 ug via INTRAVENOUS

## 2023-10-16 MED ORDER — MIDAZOLAM HCL 2 MG/2ML IJ SOLN
INTRAMUSCULAR | Status: AC
  Filled 2023-10-16: qty 2

## 2023-10-16 MED ORDER — TRAVASOL 10 % IV SOLN
INTRAVENOUS | Status: AC
  Filled 2023-10-16: qty 999

## 2023-10-16 MED ORDER — ALTEPLASE 2 MG IJ SOLR
2.0000 mg | Freq: Once | INTRAMUSCULAR | Status: AC
  Administered 2023-10-16: 2 mg
  Filled 2023-10-16 (×2): qty 2

## 2023-10-16 NOTE — Progress Notes (Signed)
 Consult received to check PICC line due to TPN continues to alarm occlusion. Unable to resolve occlusion without disconnecting TPN. TPA per protocol to white and grey lumens which had the most resistance to flush and sluggish blood return. Red lumen available for scheduled IV medications. RN spoke with pharmacy regarding D10W infusion dosing until next TPN dose can be given.

## 2023-10-16 NOTE — Progress Notes (Signed)
 Calorie Count Note  Calorie count ordered.  Diet: GI soft Supplements: Ensure Plus High Protein BID  Estimated Nutritional Needs:  Kcal:  1850-2050 Protein:  95-110g Fluid:  >/=1.8L  Breakfast: 0% Lunch: 165 kcal and 2g protein Dinner: 0% Supplements: 175 kcal and 10g protein (half an ensure)  Total intake: 340 kcal (18% of minimum estimated needs)  12g protein (13% of minimum estimated needs)  Pt not meeting nutrition needs via oral intake based on calorie count from yesterday. Pt notably continues with nausea and is receiving zofran . Today pt is NPO for percutaneous drainage d/t multiple pevlic abscesses on CT scan yesterday. Pt has missed breakfast and lunch. Calorie count will not provide meaningful information for today. Plan to follow up on 10/9 to assess oral intake tomorrow. In the meantime would recommend continuing TPN until on consistent PO diet and taking in more oral nutrition.   Nutrition Dx: Inadequate oral intake related to acute illness, altered GI function (sigmoid bowel perforation, s/p exlap and resection) as evidenced by NPO status.   Goal: Patient will meet greater than or equal to 90% of their needs   Intervention:  Start calorie count to assess nutritional adequacy Continue GI Soft diet as ordered per Surgery Change Boost Breeze to Ensure Plus High Protein po BID, each supplement provides 350 kcal and 20 grams of protein. D/t prior report of limited intake PTA; recommend small, frequent meals via 3 meals and 3 snacks daily Juven BID to support wound healing Continue MVI with minerals daily Vitamin A repletion 10,000 units daily x1 week Vitamin C repletion 250mg  BID x 30 days   [name], RDN, LDN Clinical Nutrition See AMiON for contact information.

## 2023-10-16 NOTE — Progress Notes (Signed)
 PT Cancellation Note  Patient Details Name: MAYLEIGH TETRAULT MRN: [id number] DOB: 10/31/1960   Cancelled Treatment:    Reason Eval/Treat Not Completed: (P) Patient at procedure or test/unavailable (per RN, pt leaving for drain placement @ IR dept.) Will continue efforts per PT plan of care as schedule permits.   Connell HERO Emmanuelle Hibbitts 10/16/2023, 12:52 PM

## 2023-10-16 NOTE — Procedures (Signed)
 Interventional Radiology Procedure:   Indications: Post operative intra-abdominal fluid collections  Procedure: CT guided drain placement  Findings: 10 Fr drain placed in anterior left lower quadrant collection.  Amber colored fluid aspirated from drain and fluid sent for culture.    Complications: None     EBL: Minimal  Plan:  Fluid sent for culture.  Follow drain output.    Rella Egelston R. Philip, MD  Pager: [phone]

## 2023-10-16 NOTE — Consult Note (Addendum)
 WOC Nurse wound follow up Vac change performed.  Pt was medicated for pain prior to the procedure and tolerated with mod amt discomfort.  Abd full thickness post-op wound is beefy red with yellow adipose scattered throughout. Mod amt pink drainage. Slowly decreasing in size. 18X6X4.5cm Surgical PA at the bedside to assess wound appearance.  Applied barrier ring  strip to area between wound and ostomy, since they are in close proximity, and then applied one piece black foam to cont suction.  WOC will plan to change again on Fri.  Supplies at bedside for use.    WOC Nurse ostomy follow up Colostomy pouch changed and teaching session performed.  Pt watched with a hand held mirror and assisted.  She was able to stretch the barrier ring and apply to the pouch, and was able to open and close to empty. Stoma is located in a deep crease located at 3:00 o'clock and 9:00 o'clock. It is yellow/yed and flush with skin level, 2 inches and oval.  50cc brown liquid stool Applied barrier ring and 1 piece flexible convex pouching system.  Applied belt to help hold the pouch in place over the wound and encouraged patient to get out of bed today. Ordered 5 sets of flexible convex one piece pouches to the room, as well as 10 barrier rings.  Use supplies: Order Supplies: 1pc soft convex [id number], barrier ring [id number], belt #[id number] Reviewed pouching routines and ordering supplies.  Enrolled patient in [place] Secure Start Discharge program: Yes, previously WOC team will perform another pouch change and teaching session on Fri, along with the Vac change since both are located in close proximity and unable to be sperated very successfully.   Thank-you  Stephane Fought MSN, RN, CWOCN, CWCN-AP, CNS Contact Mon-Fri 0700-1500: [phone]

## 2023-10-16 NOTE — Progress Notes (Signed)
 Patient doesn't want to take medication on empty stomach, she wants to wait till her IR procedure. Plan of care continues.

## 2023-10-16 NOTE — Plan of Care (Signed)

## 2023-10-16 NOTE — Progress Notes (Signed)
 VAST to bedside to hang new bag of TPN. When checking line before hanging new bag of TPN noticed that gray lumen is not drawing back but is flushing easily; red lumen is sluggish with blood return.TPN hung for infusion.  Reached out to Sigdel, MD for permission to stop TPN early tomorrow afternoon and instill Cath-Flo to dwell for a couple of hours prior to hanging new bag of TPN. He agreed to this plan.

## 2023-10-16 NOTE — Progress Notes (Signed)
 PHARMACY - TOTAL PARENTERAL NUTRITION CONSULT NOTE   Indication: Prolonged ileus  Patient Measurements: Height: 5' 9 (175.3 cm) Weight: 122.4 kg (269 lb 13.5 oz) IBW/kg (Calculated) : 66.2 TPN AdjBW (KG): 79.1 Body mass index is 39.85 kg/m.   Assessment:  36 YOF admitted w/ HPI significant for left sided abdominal pain that began 7 days PTA with associated decreased oral intake, found to have perforated sigmoid colon who is s/p emergent exlap w/ resection / Hartmann's colostomy. She requires TPN for anticipated post-op ileus and prolonged inability to obtain nutrition via oral route.   Glucose / Insulin : HgbA1c 4.6 / BG 127, 14 units regular insulin  in TPN, no addl SSI, Q8H CBG checks  Electrolytes:  10/6 labs - K: 4.2 (none in TPN), CoCa 9.9, Mg 1.9, others wnl Renal: Scr 0.95, BUN 23 Hepatic: AST/ ALT/alk Phos/ Tbili wnl, alb 1.7, TG 86 (10/1) Intake / Output; MIVF: UOP 0.4 ml/kg/hr, drains 25ml, colostomy 0 ml but charted LBM 10/6   GI Imaging: 9/28 CT abd shows sigmoid colitis w/ perf, extraluminal gas + stool extending into small bowel mesentery, extensive inflammation changes throughout lower abd + pelvic mesentery + peritoneal thickening GI Surgeries / Procedures:  10/6 CT abdomen: fluid collections concerning for post-op abscess  9/28 Exlap w/ resection and Hartmann's colostomy  Central access: 9/28 TPN start date: 9/29  Nutritional Goals: Goal TPN rate is 75 mL/hr (provides 100 g of protein and 2016 kcals per day)  RD Assessment: Estimated Needs Total Energy Estimated Needs: 1850-2050 Total Protein Estimated Needs: 95-110g Total Fluid Estimated Needs: >/=1.8L  Current Nutrition:  TPN 9/29 >> 10/3 CLD: Boost x2 given to patient 10/4 FLD: 1 Boost, some pudding 10/5 FLD: 1 vanilla ice cream, working on [employer]. Ostomy leakage overnight >replaced, abdominal irritation/cramping, especially after dairy products but was never lactose-intolerant before; Small amount of  liquid in ostomy  10/5 Soft diet - calorie count started on 10/5, remains with low PO intake per discussion with RD  10/7 NPO for IR placement of drains   Plan:  Continue TPN at goal 75 mL/hr at 1800, which provides 100% estimated nutritional needs Electrolytes in TPN: Na 100 mEq/L, K 0 mEq/L, Ca 3 mEq/L, Mg 10 mEq/L, Phos 10 mmol/L. Cl:Ac 1:1 Continue standard MVI and trace elements to TPN outside of TPN.  Continue with 14 units regular insulin  in TPN Continue q8hr BG check Monitor TPN labs on Mon/Thurs, and prn F/u patient toleration of soft diet, made NPO for IR drain placement of abscess.   Thank you for allowing pharmacy to be a part of this patient's care.  Powell Blush, PharmD, BCCCP  Clinical Pharmacist

## 2023-10-16 NOTE — Progress Notes (Signed)
 Patient came back from IR, AO x4. JP drain to LLQ. Vitals stable. Call bell within reach. Plan of care continues.

## 2023-10-16 NOTE — Progress Notes (Signed)
Patient off floor for IR

## 2023-10-16 NOTE — Progress Notes (Signed)
 9 Days Post-Op   Subjective/Chief Complaint: No acute events, was anxious yesterday. Expresses anxiety about managing ostomy/wound at home and does not want to go to nursing home. Ostomy leaked overnight but is productive of brown stool.   Objective: Vital signs in last 24 hours: Temp:  [97.5 F (36.4 C)-98.5 F (36.9 C)] 97.5 F (36.4 C) (10/07 0715) Pulse Rate:  [74-80] 74 (10/07 0715) Resp:  [12-19] 12 (10/07 0715) BP: (107-126)/(67-96) 112/68 (10/07 0715) SpO2:  [96 %-100 %] 96 % (10/07 0715) Last BM Date : 10/15/23  Intake/Output from previous day: 10/06 0701 - 10/07 0700 In: 937 [P.O.:218; I.V.:699.6; IV Piggyback:19.4] Out: 1125 [Urine:1100; Drains:25] Intake/Output this shift: Total I/O In: -  Out: 450 [Urine:450]  GEN: alert NAD Pulm: normal effort CV: RRR Abd: soft, appropriately tender, midline incision and stoma seen with WOC RN. Stoma is pale but viable and midline would is also viable with some sanguinous oozing.    Lab Results:  Recent Labs    10/15/23 1026  WBC 13.7*  HGB 9.7*  HCT 31.4*  PLT 304    BMET Recent Labs    10/14/23 0500 10/15/23 1026  NA 137 136  K 5.0 4.2  CL 104 105  CO2 23 25  GLUCOSE 112* 127*  BUN 26* 23  CREATININE 0.96 0.95  CALCIUM  8.1* 8.1*   PT/INR No results for input(s): LABPROT, INR in the last 72 hours. ABG No results for input(s): PHART, HCO3 in the last 72 hours.  Invalid input(s): PCO2, PO2  Studies/Results: CT ABDOMEN PELVIS W CONTRAST Result Date: 10/15/2023 EXAM: CT ABDOMEN AND PELVIS WITH CONTRAST 10/15/2023 09:38:00 PM TECHNIQUE: CT of the abdomen and pelvis was performed with the administration of intravenous contrast, 75mL (iohexol  (OMNIPAQUE ) 350 MG/ML injection 75 mL IOHEXOL  350 MG/ML SOLN). Multiplanar reformatted images are provided for review. Automated exposure control, iterative reconstruction, and/or weight-based adjustment of the mA/kV was utilized to reduce the radiation dose to  as low as reasonably achievable. COMPARISON: Comparison is made to October 07, 2023. CLINICAL HISTORY: Abdominal pain, post-operative, with anticipation of post-operative abscess. FINDINGS: LOWER CHEST: Small bilateral pleural effusions are present. Trace pericardial effusion. Cardiac size within normal limits. LIVER: The liver is unremarkable. Cholelithiasis is present without superimposed pericholecystic inflammatory change. No intra or extrahepatic biliary ductal dilation. GALLBLADDER AND BILE DUCTS: Cholelithiasis is present without superimposed pericholecystic inflammatory change. No biliary ductal dilatation. SPLEEN: No acute abnormality. PANCREAS: No acute abnormality. ADRENAL GLANDS: No acute abnormality. KIDNEYS, URETERS AND BLADDER: No stones in the kidneys or ureters. No hydronephrosis. No perinephric or periureteral stranding. Urinary bladder contains gas, which is nonspecific and may relate to recent catheterization, though aggressive infection could appear similarly. GI AND BOWEL: Interval changes of sigmoid colectomy, left midabdominal descending colostomy, and Hartmann patch formation are identified. The stomach, small bowel, and colon are otherwise unremarkable, and there is no evidence of obstruction. Punctate free intraperitoneal gas is noted within the omentum and right subdiaphragmatic region. Mesenteric edema and peritoneal enhancement are nonspecific and may represent postsurgical change and/or peritoneal inflammation. Multiple loculated rim-enhancing intraperitoneal fluid collections are identified, measuring 2.9 x 2.7 cm at image 49, series 3, and 2.2 x 3.8 cm at image 57, series 3, with growth within the deep small bowel mesentery. Within the pelvis, 3 separate fluid collections were identified, all seen on image 72, series 3, measuring 4.5 x 7.4 cm, 3.4 x 4.5 cm, and 4.3 x 7.5 cm. The appendix is not clearly identified and may be absent.  PERITONEUM AND RETROPERITONEUM: There are multiple  loculated rim-enhancing intraperitoneal fluid collections and punctate free intraperitoneal gas. Mesenteric edema and peritoneal enhancement are present. VASCULATURE: Aorta is normal in caliber. LYMPH NODES: No lymphadenopathy. REPRODUCTIVE ORGANS: No acute abnormality. BONES AND SOFT TISSUES: Osseous structures are age-appropriate. No acute bone abnormality. No lytic or blastic bone lesion. Superficial dehiscence of the midline laparotomy incision is noted. There is moderate subcutaneous edema within the flanks bilaterally. IMPRESSION: 1. Multiple loculated rim-enhancing intraperitoneal fluid collections, concerning for post-operative abscesses. 2. Punctate free intraperitoneal gas, mesenteric edema, and peritoneal enhancement, possibly representing postsurgical change and/or peritoneal inflammation. 3. Anasarca with body wall edema, Small bilateral pleural effusions and trace pericardial effusion. 4. Gas within the bladder lumen, nonspecific. Correlation with urinalysis may be helpful for further management. Electronically signed by: Dorethia Molt MD 10/15/2023 10:51 PM EDT RP Workstation: [id number]    Anti-infectives: Anti-infectives (From admission, onward)    Start     Dose/Rate Route Frequency Ordered Stop   10/07/23 2200  piperacillin -tazobactam (ZOSYN ) IVPB 3.375 g       Placed in Followed by Linked Group   3.375 g 12.5 mL/hr over 240 Minutes Intravenous Every 8 hours 10/07/23 1351     10/07/23 1732  vancomycin variable dose per unstable renal function (pharmacist dosing)  Status:  Discontinued         Does not apply See admin instructions 10/07/23 1732 10/08/23 0825   10/07/23 1545  micafungin (MYCAMINE) 150 mg in sodium chloride  0.9 % 100 mL IVPB        150 mg 107.5 mL/hr over 1 Hours Intravenous Every 24 hours 10/07/23 1530     10/07/23 1530  metroNIDAZOLE  (FLAGYL ) IVPB 500 mg  Status:  Discontinued        500 mg 100 mL/hr over 60 Minutes Intravenous Every 12 hours 10/07/23 1518  10/07/23 1532   10/07/23 1530  vancomycin (VANCOREADY) IVPB 2000 mg/400 mL        2,000 mg 200 mL/hr over 120 Minutes Intravenous  Once 10/07/23 1526 10/07/23 2033   10/07/23 1400  metroNIDAZOLE  (FLAGYL ) IVPB 500 mg        500 mg 100 mL/hr over 60 Minutes Intravenous  Once 10/07/23 1346 10/07/23 1507   10/07/23 1400  piperacillin -tazobactam (ZOSYN ) IVPB 3.375 g       Placed in Followed by Linked Group   3.375 g 100 mL/hr over 30 Minutes Intravenous  Once 10/07/23 1351 10/07/23 1502       Assessment/Plan: s/p Procedure(s): LAPAROTOMY, EXPLORATORY (N/A) EXCISION, SMALL INTESTINE (N/A) Advance diet. Allow soft food today OOB/ PT Sigmoid bowel perforation with feculent peritonitis, likely due to stercoral colitis vs diverticulitis POD#9 s/p  LAPAROTOMY, EXPLORATORY, Sigmoid colon resection, Hartman's colostomy 9.28 Dr. Rubin - soft diet, started weaning TNA 10.5, calorie count - CT scan 10/6 shows multiple pelvis abscesses - IR consult for percutaneous drainage. She is NPO and only on SQ[facility] - Vac change Mon/Thurs; appreciate WOC RN assistance with pouch leakage/close proximity to midline wound. May ultimately need to go home on wet-to-dry dressing changes but would like to try to continue [facility] for now.   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 Nephrologist: [name]  LOS: 9 days    [name] 10/16/2023

## 2023-10-16 NOTE — Consult Note (Signed)
 Chief Complaint: Intra abdominal abscess. Request is for intra abdominal abscess drain placement  Referring Physician(s): E. Siman PA  Supervising Physician: Philip Cornet  Patient Status: [name] - In-pt  History of Present Illness: [name] is a 63 y.o. female  inpatient. History of bowel perforation, colitis, diabetes, ILD, vasculitis, immunosuppressed, CHF  Presented to the ED at [facility] on 9.28.25 with abdominal pain X 1 week. Found to haves sigmoid bowel perforation s/p Sigmoid colon resection, Hartman's colostomy 9.28 Dr. Rubin .  CT Abd from 10.6.25 reads Multiple loculated rim-enhancing intraperitoneal fluid collections, concerning for post-operative abscesses.  Patient alert and laying in bed,calm. Endorses abdominal pain and suprapubic pain. Denies any fevers, headache, chest pain, SOB, cough, nausea, vomiting or bleeding.   WBC 13.7. Patient is on subcutaneous prophylactic dose of heparin . Last dose given  on 10.7.25 @ 06:06.  No pertinent allergies. Patient has been NPO since midnight.  Return precautions and treatment recommendations and follow-up discussed with the patient  who is agreeable with the plan.    Past Medical History:  Diagnosis Date   Acute blood loss anemia 06/23/2013   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HCC)    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HCC) 06/24/2013   Anxiety associated with depression 09/23/2012   At high risk for falls 09/23/2012   She has had a fractured ankle and tendon tear with falls over the last couple years.   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   Diverticulitis of large intestine with perforation 09/23/2012   Dyspnea 05/11/2014   GERD (gastroesophageal reflux disease)    Hemoptysis 06/23/2013   History of endometriosis  1997   Hx of tobacco use, presenting hazards to health 09/23/2012   Hyperglycemia 07/24/2013   Hypertension    Hypokalemia 07/20/2013   ILD (interstitial lung disease) (HCC) 01/22/2014   Leukocytosis 05/11/2014   MPA (microscopic polyangiitis) (HCC) 2015   Numbness    Obesity (BMI 30-39.9) 09/23/2012   Paresthesia 12/26/2016   Perforation of sigmoid colon - stercoral 09/27/2012   Physical deconditioning 04/30/2015   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   Respiratory failure with hypoxia (HCC) 06/23/2013   Right leg swelling 12/26/2016    Past Surgical History:  Procedure Laterality Date   APPENDECTOMY     BLADDER REPAIR     BOWEL RESECTION N/A 10/07/2023   Procedure: EXCISION, SMALL INTESTINE;  Surgeon: Rubin Calamity, MD;  Location: [facility] OR;  Service: General;  Laterality: N/A;   DILATATION & CURETTAGE/HYSTEROSCOPY WITH MYOSURE N/A 06/03/2019   Procedure: DILATATION & CURETTAGE/HYSTEROSCOPY WITH MYOSURE;  Surgeon: Curlene Agent, MD;  Location: [facility] OR;  Service: Gynecology;  Laterality: N/A;   IR CATHETER TUBE CHANGE  06/24/2019   IR FLUORO GUIDE CV LINE RIGHT  06/16/2019   IR RADIOLOGIST EVAL & MGMT  07/29/2019   IR US  GUIDE VASC ACCESS RIGHT  06/16/2019   LAPAROSCOPY  1997   dx of endometriosis   LAPAROSCOPY  04/28/2000    Laparoscopy with lysis of adhesions, hysteroscopy, D&C.   LAPAROTOMY N/A 10/07/2023   Procedure: LAPAROTOMY, EXPLORATORY;  Surgeon: Rubin Calamity, MD;  Location: [facility] OR;  Service: General;  Laterality: N/A;   TONSILLECTOMY      Allergies: Mucinex [guaifenesin er], Sulfa antibiotics, and Glucophage [metformin]  Medications: Prior to Admission medications   Medication Sig Start Date  End Date Taking? Authorizing Provider  ALPRAZolam  (XANAX ) 1 MG tablet Take 1 tablet (1 mg total) by mouth 3 (three) times daily as needed for anxiety. Patient taking differently: Take 0.5-1.5 mg by mouth See admin instructions. Take 1-1.5 tablets (1-1.5mg ) by mouth every night at bedtime  and take 0.5-1 tablets (0.5-1mg ) twice daily as needed for anxiety. 06/30/19  Yes Odell Celinda Balo, MD  buPROPion  (WELLBUTRIN  XL) 150 MG 24 hr tablet Take 450 mg by mouth daily.   Yes [provider]  carvedilol  (COREG ) 12.5 MG tablet Take 1 tablet (12.5 mg total) by mouth 2 (two) times daily. 07/10/23 11/17/23 Yes Wyn Jackee VEAR Mickey., NP  ergocalciferol (VITAMIN D2) 1.25 MG (50000 UT) capsule Take 50,000 Units by mouth every Sunday.   Yes [provider]  FLUoxetine (PROZAC) 40 MG capsule Take 40 mg by mouth daily.   Yes [provider]  folic acid  (FOLVITE ) 1 MG tablet Take 1 mg by mouth daily.    Yes [provider]  methotrexate  50 MG/2ML injection Inject 15 mg into the vein every Sunday.   Yes [provider]  mycophenolate  (CELLCEPT ) 500 MG tablet Take 3 tablets (1,500 mg total) by mouth 2 (two) times daily. 07/14/19  Yes Odell Celinda Balo, MD  nitroGLYCERIN  (NITROSTAT ) 0.4 MG SL tablet Place 1 tablet (0.4 mg total) under the tongue every 5 (five) minutes as needed for chest pain. 09/22/21 11/17/23 Yes Wyn Jackee VEAR Mickey., NP  polyethylene glycol (MIRALAX  / GLYCOLAX ) packet Take 17 g by mouth 2 (two) times daily as needed (constipation).   Yes [provider]  sacubitril -valsartan  (ENTRESTO ) 49-51 MG Take 1 tablet by mouth 2 (two) times daily. 08/23/23  Yes Nishan, Peter C, MD  Semaglutide, 2 MG/DOSE, (OZEMPIC, 2 MG/DOSE,) 8 MG/3ML SOPN Inject 2 mg into the skin every Sunday.   Yes [provider]  Tiotropium Bromide Monohydrate  (SPIRIVA  RESPIMAT) 1.25 MCG/ACT AERS Inhale 2 puffs into the lungs daily. Patient taking differently: Inhale 2 puffs into the lungs daily as needed (wheezing shortness of breath). 03/12/23  Yes Geronimo Amel, MD  pregabalin (LYRICA) 100 MG capsule Take 100 mg by mouth 2 (two) times daily. Patient not taking: Reported on 10/09/2023    [provider]     Family History  Problem Relation Age of Onset    Diabetes Mother        AODM   Diabetes Father        AODM   Hypertension Father    Obesity Father    Breast cancer Paternal Grandmother    Birth defects Cousin    CAD Other        [facility]    Social History   Socioeconomic History   Marital status: Married    Spouse name: Not on file   Number of children: 0   Years of education: 12   Highest education level: High school graduate  Occupational History   Occupation: Disabled  Tobacco Use   Smoking status: Former    Current packs/day: 0.00    Average packs/day: 1 pack/day for 27.0 years (27.0 ttl pk-yrs)    Types: Cigarettes    Start date: 01/09/1974    Quit date: 01/09/2001    Years since quitting: 22.7   Smokeless tobacco: Never  Vaping Use   Vaping status: Never Used  Substance and Sexual Activity   Alcohol use: No   Drug use: No   Sexual activity: Not on file  Other Topics Concern  Not on file  Social History Narrative   Lives at home with her husband.   Right-handed.   1 cup caffeine  per day, occasionally more.   Social Drivers of Corporate investment banker Strain: Not on file  Food Insecurity: No Food Insecurity (10/08/2023)   Hunger Vital Sign    Worried About Running Out of Food in the Last Year: Never true    Ran Out of Food in the Last Year: Never true  Transportation Needs: No Transportation Needs (10/08/2023)   PRAPARE - Administrator, Civil Service (Medical): No    Lack of Transportation (Non-Medical): No  Physical Activity: Not on file  Stress: Not on file  Social Connections: Moderately Integrated (10/08/2023)   Social Connection and Isolation Panel    Frequency of Communication with Friends and Family: Once a week    Frequency of Social Gatherings with Friends and Family: Once a week    Attends Religious Services: 1 to 4 times per year    Active Member of [employer] or Organizations: No    Attends Engineer, structural: 1 to 4 times per year    Marital Status: Married     Review  of Systems: A 12 point ROS discussed and pertinent positives are indicated in the HPI above.  All other systems are negative.  Review of Systems  Constitutional:  Negative for fatigue and fever.  HENT:  Negative for congestion.   Respiratory:  Negative for cough and shortness of breath.   Gastrointestinal:  Positive for abdominal pain (suprapubic). Negative for diarrhea, nausea and vomiting.    Vital Signs: BP 112/68 (BP Location: Right Wrist)   Pulse 74   Temp (!) 97.5 F (36.4 C) (Oral)   Resp 12   Ht 5' 9 (1.753 m)   Wt 269 lb 13.5 oz (122.4 kg)   SpO2 96%   BMI 39.85 kg/m     Physical Exam Vitals and nursing note reviewed.  Constitutional:      Appearance: She is well-developed. She is obese.  HENT:     Head: Normocephalic and atraumatic.  Eyes:     Conjunctiva/sclera: Conjunctivae normal.  Cardiovascular:     Rate and Rhythm: Normal rate.  Pulmonary:     Effort: Pulmonary effort is normal.  Abdominal:     Comments: Ostomy bag with no output. Left of midline. Midline wound vac present.   Musculoskeletal:        General: Normal range of motion.     Cervical back: Normal range of motion.  Skin:    General: Skin is warm and dry.  Neurological:     General: No focal deficit present.     Mental Status: She is alert and oriented to person, place, and time.  Psychiatric:        Mood and Affect: Mood normal.        Behavior: Behavior normal.     Imaging: CT ABDOMEN PELVIS W CONTRAST Result Date: 10/15/2023 EXAM: CT ABDOMEN AND PELVIS WITH CONTRAST 10/15/2023 09:38:00 PM TECHNIQUE: CT of the abdomen and pelvis was performed with the administration of intravenous contrast, 75mL (iohexol  (OMNIPAQUE ) 350 MG/ML injection 75 mL IOHEXOL  350 MG/ML SOLN). Multiplanar reformatted images are provided for review. Automated exposure control, iterative reconstruction, and/or weight-based adjustment of the mA/kV was utilized to reduce the radiation dose to as low as reasonably  achievable. COMPARISON: Comparison is made to October 07, 2023. CLINICAL HISTORY: Abdominal pain, post-operative, with anticipation of post-operative abscess. FINDINGS:  LOWER CHEST: Small bilateral pleural effusions are present. Trace pericardial effusion. Cardiac size within normal limits. LIVER: The liver is unremarkable. Cholelithiasis is present without superimposed pericholecystic inflammatory change. No intra or extrahepatic biliary ductal dilation. GALLBLADDER AND BILE DUCTS: Cholelithiasis is present without superimposed pericholecystic inflammatory change. No biliary ductal dilatation. SPLEEN: No acute abnormality. PANCREAS: No acute abnormality. ADRENAL GLANDS: No acute abnormality. KIDNEYS, URETERS AND BLADDER: No stones in the kidneys or ureters. No hydronephrosis. No perinephric or periureteral stranding. Urinary bladder contains gas, which is nonspecific and may relate to recent catheterization, though aggressive infection could appear similarly. GI AND BOWEL: Interval changes of sigmoid colectomy, left midabdominal descending colostomy, and Hartmann patch formation are identified. The stomach, small bowel, and colon are otherwise unremarkable, and there is no evidence of obstruction. Punctate free intraperitoneal gas is noted within the omentum and right subdiaphragmatic region. Mesenteric edema and peritoneal enhancement are nonspecific and may represent postsurgical change and/or peritoneal inflammation. Multiple loculated rim-enhancing intraperitoneal fluid collections are identified, measuring 2.9 x 2.7 cm at image 49, series 3, and 2.2 x 3.8 cm at image 57, series 3, with growth within the deep small bowel mesentery. Within the pelvis, 3 separate fluid collections were identified, all seen on image 72, series 3, measuring 4.5 x 7.4 cm, 3.4 x 4.5 cm, and 4.3 x 7.5 cm. The appendix is not clearly identified and may be absent. PERITONEUM AND RETROPERITONEUM: There are multiple loculated  rim-enhancing intraperitoneal fluid collections and punctate free intraperitoneal gas. Mesenteric edema and peritoneal enhancement are present. VASCULATURE: Aorta is normal in caliber. LYMPH NODES: No lymphadenopathy. REPRODUCTIVE ORGANS: No acute abnormality. BONES AND SOFT TISSUES: Osseous structures are age-appropriate. No acute bone abnormality. No lytic or blastic bone lesion. Superficial dehiscence of the midline laparotomy incision is noted. There is moderate subcutaneous edema within the flanks bilaterally. IMPRESSION: 1. Multiple loculated rim-enhancing intraperitoneal fluid collections, concerning for post-operative abscesses. 2. Punctate free intraperitoneal gas, mesenteric edema, and peritoneal enhancement, possibly representing postsurgical change and/or peritoneal inflammation. 3. Anasarca with body wall edema, Small bilateral pleural effusions and trace pericardial effusion. 4. Gas within the bladder lumen, nonspecific. Correlation with urinalysis may be helpful for further management. Electronically signed by: Dorethia Molt MD 10/15/2023 10:51 PM EDT RP Workstation: [id number]   US  EKG SITE RITE Result Date: 10/11/2023 If Site Rite image not attached, placement could not be confirmed due to current cardiac rhythm.  DG CHEST PORT 1 VIEW Result Date: 10/11/2023 EXAM: 1 VIEW(S) XRAY OF THE CHEST 10/11/2023 08:22:00 AM COMPARISON: 10/07/2023 CLINICAL HISTORY: Shortness of breath. FINDINGS: LINES, TUBES AND DEVICES: Right PICC in place with tip at the superior cavoatrial junction. LUNGS AND PLEURA: Low lung volumes. No focal pulmonary opacity. No pulmonary edema. No pleural effusion. No pneumothorax. HEART AND MEDIASTINUM: No acute abnormality of the cardiac and mediastinal silhouettes. BONES AND SOFT TISSUES: No acute osseous abnormality. IMPRESSION: 1. No acute cardiopulmonary process. 2. Low lung volumes. Electronically signed by: Ryan Salvage MD 10/11/2023 12:09 PM EDT RP Workstation:  [id number]   US  EKG SITE RITE Result Date: 10/08/2023 If Site Rite image not attached, placement could not be confirmed due to current cardiac rhythm.  DG Chest 1 View Result Date: 10/07/2023 CLINICAL DATA:  pre op EXAM: CHEST  1 VIEW COMPARISON:  July 15, 2019 FINDINGS: The cardiomediastinal silhouette is unchanged in contour. No pleural effusion. No pneumothorax. No acute pleuroparenchymal abnormality. IMPRESSION: No acute cardiopulmonary abnormality. Electronically Signed   By: Corean Salter M.D.   On: 10/07/2023 14:27  CT L-SPINE NO CHARGE Result Date: 10/07/2023 EXAM: CT OF THE LUMBAR SPINE WITH CONTRAST 10/07/2023 01:12:32 PM TECHNIQUE: CT of the lumbar spine was performed with the administration of 75 mL of iohexol  (OMNIPAQUE ) 350 MG/ML intravenous contrast. Multiplanar reformatted images are provided for review. Automated exposure control, iterative reconstruction, and/or weight based adjustment of the mA/kV was utilized to reduce the radiation dose to as low as reasonably achievable. COMPARISON: MR lumbar spine 04/09/2022. CLINICAL HISTORY: Back pain for 3 days. FINDINGS: BONES AND ALIGNMENT: Normal vertebral body heights. No acute fracture or suspicious bone lesion. Normal alignment. DEGENERATIVE CHANGES: Mild multilevel endplate degenerative changes. Posterior disc bulge/herniation is noted at L4-5 and L5-S1 SOFT TISSUES: No acute abnormality. IMPRESSION: 1. No acute fracture or suspicious bone lesion. 2. Posterior disc bulge/herniation at L4-5 and L5-S1. Electronically signed by: Waddell Calk MD 10/07/2023 01:55 PM EDT RP Workstation: [id number]   CT ABDOMEN PELVIS W CONTRAST Result Date: 10/07/2023 EXAM: CT ABDOMEN AND PELVIS WITH CONTRAST 10/07/2023 01:12:32 PM TECHNIQUE: CT of the abdomen and pelvis was performed with the administration of 75 mL of iohexol  (OMNIPAQUE ) 350 MG/ML injection. Multiplanar reformatted images are provided for review. Automated exposure control, iterative  reconstruction, and/or weight-based adjustment of the mA/kV was utilized to reduce the radiation dose to as low as reasonably achievable. COMPARISON: None available. CLINICAL HISTORY: Abdominal pain, acute, nonlocalized. FINDINGS: LOWER CHEST: No acute abnormality. LIVER: The liver is unremarkable. GALLBLADDER AND BILE DUCTS: Sludge ball versus noncalcified stone identified within the gallbladder measuring 1.9 cm, image 36/3. No biliary ductal dilatation. SPLEEN: No acute abnormality. PANCREAS: No acute abnormality. ADRENAL GLANDS: No acute abnormality. KIDNEYS, URETERS AND BLADDER: Wall thickening involving the bladder with surrounding soft tissue stranding likely reflects secondary inflammation to changes described below. No stones in the kidneys or ureters. No hydronephrosis. No perinephric or periureteral stranding. GI AND BOWEL: Stomach is normal. No pathologic dilatation of the bowel loops. There is a moderate amount of desiccated stool which distends the lumen of the sigmoid colon. There is marked wall thickening involving the sigmoid colon with severe surrounding inflammatory fat stranding and fluid. Signs of colonic perforation identified with stool exiting the bowel and extending into the small bowel mesentery along with multiple small foci of extraluminal gas axial image 71/3. Associated inflammatory changes extend throughout the lower abdominal and pelvic . Small amount of early fluid loculation between the anterior wall of the sigmoid colon and posterior wall of bladder measuring 2.7 x 2.0 cm, image 86/3. PERITONEUM AND RETROPERITONEUM: Diffuse inflammatory changes within the abdomen and pelvis. VASCULATURE: Aortic atherosclerosis. LYMPH NODES: Mildly enlarged lower abdominal and pelvic lymph nodes are likely reactive. REPRODUCTIVE ORGANS: No acute abnormality. BONES AND SOFT TISSUES: No acute osseous abnormality. IMPRESSION: 1. Marked sigmoid colitis with perforation, evidenced by extraluminal gas and  stool extending into the small bowel mesentery, with extensive inflammatory changes throughout the lower abdominal and pelvic mesentery and peritoneal thickening. 2. Small early fluid loculation (2.7 x 2.0 cm) between the anterior sigmoid colon and posterior bladder wall. 3. Gallbladder sludge ball versus noncalcified stone (1.9 cm). 4. Aortic atherosclerotic calcifications 5. Critical results were called to the clinical service at the time of interpretation at 10/07/2023 - 1:51 pm. I spoke with John K. Lang and delivered these results. Electronically signed by: Waddell Calk MD 10/07/2023 01:52 PM EDT RP Workstation: [id number]    Labs:  CBC: Recent Labs    10/10/23 0445 10/11/23 1015 10/11/23 1142 10/15/23 1026  WBC 31.7* 11.5* 12.0* 13.7*  HGB  12.0 10.7* 11.2* 9.7*  HCT 37.2 33.6* 35.4* 31.4*  PLT 335 309 317 304    COAGS: No results for input(s): INR, APTT in the last 8760 hours.  BMP: Recent Labs    10/12/23 0345 10/13/23 0617 10/14/23 0500 10/15/23 1026  NA 136 139 137 136  K 4.7 5.2* 5.0 4.2  CL 104 108 104 105  CO2 22 24 23 25   GLUCOSE 114* 88 112* 127*  BUN 37* 32* 26* 23  CALCIUM  7.8* 7.9* 8.1* 8.1*  CREATININE 1.22* 0.99 0.96 0.95  GFRNONAA 50* >60 >60 >60    LIVER FUNCTION TESTS: Recent Labs    10/09/23 0506 10/10/23 0445 10/11/23 1330 10/12/23 0345 10/15/23 1026  BILITOT 0.8 0.8 0.4  --  0.6  AST 12* 14* 18  --  15  ALT 9 9 11   --  10  ALKPHOS 49 62 63  --  85  PROT 5.3* 6.1* 6.0*  --  6.3*  ALBUMIN  1.9* 2.1* 2.0* 1.6* 1.7*     Assessment and Plan:  64 y.o. female inpatient. History of bowel perforation, colitis, diabetes, ILD, vasculitis, immunosuppressed, CHF  Presented to the ED at [name] on 9.28.25 with abdominal pain X 1 week. Found to haves sigmoid bowel perforation s/p Sigmoid colon resection, Hartman's colostomy 9.28 Dr. Rubin .  CT Abd from 10.6.25 reads Multiple loculated rim-enhancing intraperitoneal fluid collections, concerning  for post-operative abscesses. Team is requesting an intra abdominal abscess drain placement.   PLAN: IR Image Guided Intra abdominal Drain Placement  Risks and benefits discussed with the patient including bleeding, infection, damage to adjacent structures, bowel perforation/fistula connection, and sepsis.  All of the patient's questions were answered, patient is agreeable to proceed.  Consent signed and in chart.    Thank you for this interesting consult.  I greatly enjoyed meeting NAILEAH KARG and look forward to participating in their care.  A copy of this report was sent to the requesting provider on this date.  Electronically Signed: Delon JAYSON Beagle, NP 10/16/2023, 7:31 AM   I spent a total of 40 Minutes    in face to face in clinical consultation, greater than 50% of which was counseling/coordinating care for intra abdominal abscess drain placement

## 2023-10-16 NOTE — Progress Notes (Signed)
 PROGRESS NOTE    [name]  FMW:[id number] DOB: [birth year] DOA: 10/07/2023 PCP: Lynwood Laneta ORN, PA-C   Brief Narrative:  63 year old female with past medical history significant for ANCA vasculitis, chronic immunosuppression, type 2 diabetes, hypertension, hyperlipidemia, ILD and previous sigmoid colon perforation who presented to the ER via EMS with left-sided abdominal pain that began 7 days prior to admission.  On arrival patient was hypothermic and admitted with fever, tachypnea, tachycardic and hypotensive.  Workup revealed mild sigmoid colitis with perforation.  Patient admitted to ICU for septic shock.  She underwent exploratory laparotomy on 9/28, sigmoid bowel perforation with feculent peritonitis noted.  She was transferred to [facility] on 10/4.  She remains on TPN.  Has been requiring frequent pain medications and is noted to have softer blood pressures but blood pressure seems to have improved in the past several days, remains on midodrine.  Slowly she has regained her bowel function.  CT 10/6 shows multiple abscess in the abdomen, plan for IR drainage today.  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  Has wound VAC to abdominal wound. General Surgery managing. Continue TPN, starting to have bowel function, started on GI soft diet on 10/5. Required vasopressors initially, will continue midodrine 10 mg 3 times a day with plan to back off depending on her blood pressures. Continue analgesics, patient has been requiring frequent IV fentanyl , oxycodone  p.o.  Receiving scheduled Robaxin 1000 every 6 hours. Continue IV Zosyn , micafungin (start 9/28).  CT abdomen and pelvis with IV contrast on 10/6 shows multiple intraperitoneal abscesses.  Plan for IR drainage today  IV Zofran    >>ANCA associated vasculitis/ILD, on methotrexate /CellCept  at baseline, currently on hold due to  infection  continue incruse  >>Diabetes mellitus type 2: Hemoglobin A1c is 4.6 on 10/07/2023.  Blood sugars reading are in acceptable range.  >>NICM:  On Coreg , Entresto  PTA.  Will continue to hold at this time due to hypotension issues.  Resume gradually as appropriate.  >> Borderline hyperkalemia:  Will recheck labs intermittently.  Anxiety: Continue alprazolam , bupropion , fluoxetine  >> Physical deconditioning: PT OT following.  Principal Problem:   Perforation of sigmoid colon - stercoral Active Problems:   Septic shock (HCC)   Postprocedural intraabdominal abscess (HCC)   Obesity (BMI 30-39.9)   Anxiety associated with depression   H/O gastroesophageal reflux (GERD)   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HCC)   Hypokalemia   ILD (interstitial lung disease) (HCC)   Physical deconditioning      DVT prophylaxis: Heparin  subcu Code Status: Full code Family Communication: No family at the bedside Disposition Plan: Likely will need rehabilitation however patient prefers discharge to home with home health  status is: Inpatient Remains inpatient appropriate because: Intra-abdominal abscess, continue TPN. Subjective:  Patient seen and examined at the bedside.  She is n.p.o. today for drain placement by IR for intra-abdominal abscess.  She continues to have some abdominal discomfort.   Objective: Vitals:   10/15/23 2002 10/15/23 2335 10/16/23 0415 10/16/23 0715  BP: (!) 110/96 125/84 107/87 112/68  Pulse: 78 78 74 74  Resp: 18 18 18 12   Temp: 98.5 F (36.9 C) 98.2 F (36.8 C) 98.1 F (36.7 C) (!) 97.5 F (36.4 C)  TempSrc: Oral Oral Oral Oral  SpO2: 100% 100% 100% 96%  Weight:      Height:        Intake/Output Summary (Last 24 hours) at 10/16/2023 0816 Last data  filed at 10/16/2023 0720 Gross per 24 hour  Intake 750.92 ml  Output 1550 ml  Net -799.08 ml   Filed Weights   10/12/23 0400 10/13/23 0428 10/14/23 0350  Weight: 122.9 kg 130.2 kg 122.4 kg     Examination:  General exam: Alert oriented, not in any acute distress  respiratory system: Bilateral decreased breath sounds at bases Cardiovascular system: S1 & S2 heard, Rate controlled Gastrointestinal system: Wound VAC on the right abdominal wound, ostomy with liquid stool, mild diffuse tenderness to palpation, bowel sounds  neg extremities: No cyanosis, clubbing, edema  Central nervous system: Alert and oriented. No focal neurological deficits. Moving extremities Skin: No rashes, lesions or ulcers Psychiatry: Judgement and insight appear normal. Mood & affect appropriate.     Data Reviewed: I have personally reviewed following labs and imaging studies  CBC: Recent Labs  Lab 10/09/23 0915 10/10/23 0445 10/11/23 1015 10/11/23 1142 10/15/23 1026  WBC 26.3* 31.7* 11.5* 12.0* 13.7*  HGB 11.4* 12.0 10.7* 11.2* 9.7*  HCT 34.3* 37.2 33.6* 35.4* 31.4*  MCV 86.0 87.9 91.3 93.2 90.2  PLT 322 335 309 317 304   Basic Metabolic Panel: Recent Labs  Lab 10/11/23 1330 10/12/23 0345 10/13/23 0617 10/14/23 0500 10/15/23 1026  NA 135 136 139 137 136  K 4.7 4.7 5.2* 5.0 4.2  CL 104 104 108 104 105  CO2 22 22 24 23 25   GLUCOSE 134* 114* 88 112* 127*  BUN 36* 37* 32* 26* 23  CREATININE 1.29* 1.22* 0.99 0.96 0.95  CALCIUM  8.2* 7.8* 7.9* 8.1* 8.1*  MG 1.9 2.2 1.8 1.9 1.9  PHOS 2.7 2.6 4.1 3.9 3.4   GFR: Estimated Creatinine Clearance: 86 mL/min (by C-G formula based on SCr of 0.95 mg/dL). Liver Function Tests: Recent Labs  Lab 10/10/23 0445 10/11/23 1330 10/12/23 0345 10/15/23 1026  AST 14* 18  --  15  ALT 9 11  --  10  ALKPHOS 62 63  --  85  BILITOT 0.8 0.4  --  0.6  PROT 6.1* 6.0*  --  6.3*  ALBUMIN  2.1* 2.0* 1.6* 1.7*   No results for input(s): LIPASE, AMYLASE in the last 168 hours.  No results for input(s): AMMONIA in the last 168 hours. Coagulation Profile: No results for input(s): INR, PROTIME in the last 168 hours. Cardiac Enzymes: No results  for input(s): CKTOTAL, CKMB, CKMBINDEX, TROPONINI in the last 168 hours. BNP (last 3 results) No results for input(s): PROBNP in the last 8760 hours. HbA1C: No results for input(s): HGBA1C in the last 72 hours. CBG: Recent Labs  Lab 10/14/23 1150 10/14/23 1748 10/15/23 0109 10/15/23 1213 10/15/23 1648  GLUCAP 128* 124* 137* 141* 140*   Lipid Profile: Recent Labs    10/15/23 0420 10/15/23 1026  TRIG SPECIMEN CONTAMINATED, UNABLE TO PERFORM TEST(S). 86   Thyroid Function Tests: No results for input(s): TSH, T4TOTAL, FREET4, T3FREE, THYROIDAB in the last 72 hours. Anemia Panel: No results for input(s): VITAMINB12, FOLATE, FERRITIN, TIBC, IRON, RETICCTPCT in the last 72 hours. Sepsis Labs: No results for input(s): PROCALCITON, LATICACIDVEN in the last 168 hours.   Recent Results (from the past 240 hours)  Urine Culture     Status: Abnormal   Collection Time: 10/07/23 12:24 PM   Specimen: Urine, Random  Result Value Ref Range Status   Specimen Description URINE, RANDOM  Final   Special Requests NONE Reflexed from [id number]  Final   Culture (A)  Final    >=100,000 COLONIES/mL AEROCOCCUS SPECIES Standardized  susceptibility testing for this organism is not available. Performed at [facility], [address] [place], [place] [address]    Report Status 10/08/2023 FINAL  Final  Culture, blood (routine x 2)     Status: None   Collection Time: 10/07/23  2:03 PM   Specimen: BLOOD LEFT ARM  Result Value Ref Range Status   Specimen Description BLOOD LEFT ARM  Final   Special Requests   Final    BOTTLES DRAWN AEROBIC AND ANAEROBIC Blood Culture results may not be optimal due to an inadequate volume of blood received in culture bottles   Culture   Final    NO GROWTH 5 DAYS Performed at [facility], [address] [place], [place] [address]    Report Status 10/12/2023 FINAL  Final  Culture, blood (routine x 2)     Status: None    Collection Time: 10/07/23  2:04 PM   Specimen: BLOOD LEFT ARM  Result Value Ref Range Status   Specimen Description BLOOD LEFT ARM  Final   Special Requests   Final    BOTTLES DRAWN AEROBIC AND ANAEROBIC Blood Culture results may not be optimal due to an inadequate volume of blood received in culture bottles   Culture   Final    NO GROWTH 5 DAYS Performed at [facility], [address] [place], [place] [address]    Report Status 10/12/2023 FINAL  Final  MRSA Next Gen by PCR, Nasal     Status: Abnormal   Collection Time: 10/07/23  3:28 PM   Specimen: Nasal Mucosa; Nasal Swab  Result Value Ref Range Status   MRSA by PCR Next Gen DETECTED (A) NOT DETECTED Final    Comment: (NOTE) The GeneXpert MRSA Assay (FDA approved for NASAL specimens only), is one component of a comprehensive MRSA colonization surveillance program. It is not intended to diagnose MRSA infection nor to guide or monitor treatment for MRSA infections. Test performance is not FDA approved in patients less than 57 years old. Performed at [facility], [address] [place], [place] [address]          Radiology Studies: CT ABDOMEN PELVIS W CONTRAST Result Date: 10/15/2023 EXAM: CT ABDOMEN AND PELVIS WITH CONTRAST 10/15/2023 09:38:00 PM TECHNIQUE: CT of the abdomen and pelvis was performed with the administration of intravenous contrast, 75mL (iohexol  (OMNIPAQUE ) 350 MG/ML injection 75 mL IOHEXOL  350 MG/ML SOLN). Multiplanar reformatted images are provided for review. Automated exposure control, iterative reconstruction, and/or weight-based adjustment of the mA/kV was utilized to reduce the radiation dose to as low as reasonably achievable. COMPARISON: Comparison is made to October 07, 2023. CLINICAL HISTORY: Abdominal pain, post-operative, with anticipation of post-operative abscess. FINDINGS: LOWER CHEST: Small bilateral pleural effusions are present. Trace pericardial effusion. Cardiac size within  normal limits. LIVER: The liver is unremarkable. Cholelithiasis is present without superimposed pericholecystic inflammatory change. No intra or extrahepatic biliary ductal dilation. GALLBLADDER AND BILE DUCTS: Cholelithiasis is present without superimposed pericholecystic inflammatory change. No biliary ductal dilatation. SPLEEN: No acute abnormality. PANCREAS: No acute abnormality. ADRENAL GLANDS: No acute abnormality. KIDNEYS, URETERS AND BLADDER: No stones in the kidneys or ureters. No hydronephrosis. No perinephric or periureteral stranding. Urinary bladder contains gas, which is nonspecific and may relate to recent catheterization, though aggressive infection could appear similarly. GI AND BOWEL: Interval changes of sigmoid colectomy, left midabdominal descending colostomy, and Hartmann patch formation are identified. The stomach, small bowel, and colon are otherwise unremarkable, and  there is no evidence of obstruction. Punctate free intraperitoneal gas is noted within the omentum and right subdiaphragmatic region. Mesenteric edema and peritoneal enhancement are nonspecific and may represent postsurgical change and/or peritoneal inflammation. Multiple loculated rim-enhancing intraperitoneal fluid collections are identified, measuring 2.9 x 2.7 cm at image 49, series 3, and 2.2 x 3.8 cm at image 57, series 3, with growth within the deep small bowel mesentery. Within the pelvis, 3 separate fluid collections were identified, all seen on image 72, series 3, measuring 4.5 x 7.4 cm, 3.4 x 4.5 cm, and 4.3 x 7.5 cm. The appendix is not clearly identified and may be absent. PERITONEUM AND RETROPERITONEUM: There are multiple loculated rim-enhancing intraperitoneal fluid collections and punctate free intraperitoneal gas. Mesenteric edema and peritoneal enhancement are present. VASCULATURE: Aorta is normal in caliber. LYMPH NODES: No lymphadenopathy. REPRODUCTIVE ORGANS: No acute abnormality. BONES AND SOFT TISSUES:  Osseous structures are age-appropriate. No acute bone abnormality. No lytic or blastic bone lesion. Superficial dehiscence of the midline laparotomy incision is noted. There is moderate subcutaneous edema within the flanks bilaterally. IMPRESSION: 1. Multiple loculated rim-enhancing intraperitoneal fluid collections, concerning for post-operative abscesses. 2. Punctate free intraperitoneal gas, mesenteric edema, and peritoneal enhancement, possibly representing postsurgical change and/or peritoneal inflammation. 3. Anasarca with body wall edema, Small bilateral pleural effusions and trace pericardial effusion. 4. Gas within the bladder lumen, nonspecific. Correlation with urinalysis may be helpful for further management. Electronically signed by: Dorethia Molt MD 10/15/2023 10:51 PM EDT RP Workstation: [id number]         Scheduled Meds:  acetaminophen   1,000 mg Oral QID   ascorbic acid  250 mg Oral BID   buPROPion   450 mg Oral Daily   Chlorhexidine  Gluconate Cloth  6 each Topical Daily   feeding supplement  237 mL Oral BID BM   FLUoxetine  40 mg Oral Daily   heparin  injection (subcutaneous)  5,000 Units Subcutaneous Q8H   lidocaine   2 patch Transdermal Q24H   methocarbamol  1,000 mg Oral QID   midodrine  10 mg Oral TID WC   multivitamin with minerals  1 tablet Oral Daily   mupirocin ointment   Nasal BID   nutrition supplement (JUVEN)  1 packet Oral BID BM   ondansetron  (ZOFRAN ) IV  4 mg Intravenous Q6H   oxyCODONE   5 mg Oral Once   polyethylene glycol  17 g Oral Daily   pregabalin  50 mg Oral BID   sodium chloride  flush  10-40 mL Intracatheter Q12H   umeclidinium bromide  1 puff Inhalation Daily   vitamin A  10,000 Units Oral Daily   Continuous Infusions:  micafungin (MYCAMINE) 150 mg in sodium chloride  0.9 % 100 mL IVPB 150 mg (10/15/23 2158)   piperacillin -tazobactam 3.375 g (10/16/23 0607)   TPN ADULT (ION) 75 mL/hr at 10/15/23 1750          Derryl Duval, MD [facility] 10/16/2023, 8:16 AM

## 2023-10-16 NOTE — Plan of Care (Signed)

## 2023-10-17 DIAGNOSIS — K631 Perforation of intestine (nontraumatic): Secondary | ICD-10-CM | POA: Diagnosis not present

## 2023-10-17 LAB — CBC WITH DIFFERENTIAL/PLATELET
Abs Immature Granulocytes: 0.28 K/uL — ABNORMAL HIGH (ref 0.00–0.07)
Basophils Absolute: 0 K/uL (ref 0.0–0.1)
Basophils Relative: 0 %
Eosinophils Absolute: 0.1 K/uL (ref 0.0–0.5)
Eosinophils Relative: 1 %
HCT: 27.9 % — ABNORMAL LOW (ref 36.0–46.0)
Hemoglobin: 8.8 g/dL — ABNORMAL LOW (ref 12.0–15.0)
Immature Granulocytes: 2 %
Lymphocytes Relative: 14 %
Lymphs Abs: 1.9 K/uL (ref 0.7–4.0)
MCH: 28.2 pg (ref 26.0–34.0)
MCHC: 31.5 g/dL (ref 30.0–36.0)
MCV: 89.4 fL (ref 80.0–100.0)
Monocytes Absolute: 0.8 K/uL (ref 0.1–1.0)
Monocytes Relative: 6 %
Neutro Abs: 10.8 K/uL — ABNORMAL HIGH (ref 1.7–7.7)
Neutrophils Relative %: 77 %
Platelets: 335 K/uL (ref 150–400)
RBC: 3.12 MIL/uL — ABNORMAL LOW (ref 3.87–5.11)
RDW: 13.8 % (ref 11.5–15.5)
WBC: 14 K/uL — ABNORMAL HIGH (ref 4.0–10.5)
nRBC: 0 % (ref 0.0–0.2)

## 2023-10-17 LAB — GLUCOSE, CAPILLARY
Glucose-Capillary: 101 mg/dL — ABNORMAL HIGH (ref 70–99)
Glucose-Capillary: 105 mg/dL — ABNORMAL HIGH (ref 70–99)

## 2023-10-17 MED ORDER — METOPROLOL TARTRATE 5 MG/5ML IV SOLN: 5.0000 mg | INTRAVENOUS | Status: DC | PRN

## 2023-10-17 MED ORDER — DOCUSATE SODIUM 100 MG PO CAPS
100.0000 mg | ORAL_CAPSULE | Freq: Two times a day (BID) | ORAL | Status: DC
  Administered 2023-10-17 – 2023-10-20 (×7): 100 mg via ORAL
  Filled 2023-10-17 (×7): qty 1

## 2023-10-17 MED ORDER — FENTANYL CITRATE PF 50 MCG/ML IJ SOSY: 50.0000 ug | PREFILLED_SYRINGE | INTRAMUSCULAR | Status: DC | PRN

## 2023-10-17 MED ORDER — GUAIFENESIN 100 MG/5ML PO LIQD
5.0000 mL | ORAL | Status: DC | PRN
  Administered 2023-10-19: 5 mL via ORAL
  Filled 2023-10-17: qty 10

## 2023-10-17 MED ORDER — TRAVASOL 10 % IV SOLN
INTRAVENOUS | Status: AC
  Filled 2023-10-17: qty 999

## 2023-10-17 MED ORDER — GLUCAGON HCL RDNA (DIAGNOSTIC) 1 MG IJ SOLR: 1.0000 mg | INTRAMUSCULAR | Status: DC | PRN

## 2023-10-17 MED ORDER — IPRATROPIUM-ALBUTEROL 0.5-2.5 (3) MG/3ML IN SOLN: 3.0000 mL | RESPIRATORY_TRACT | Status: DC | PRN

## 2023-10-17 MED ORDER — PROCHLORPERAZINE EDISYLATE 10 MG/2ML IJ SOLN
10.0000 mg | Freq: Four times a day (QID) | INTRAMUSCULAR | Status: DC | PRN
  Administered 2023-10-17: 10 mg via INTRAVENOUS
  Filled 2023-10-17: qty 2

## 2023-10-17 MED ORDER — HYDRALAZINE HCL 20 MG/ML IJ SOLN: 10.0000 mg | INTRAMUSCULAR | Status: DC | PRN

## 2023-10-17 NOTE — Progress Notes (Signed)
 10 Days Post-Op   Subjective/Chief Complaint: No acute events overnight. Reports less leaking/issues with use of ostomy belt.    Objective: Vital signs in last 24 hours: Temp:  [97.6 F (36.4 C)-98.7 F (37.1 C)] 98.7 F (37.1 C) (10/08 0418) Pulse Rate:  [71-84] 79 (10/08 0418) Resp:  [12-22] 18 (10/08 0418) BP: (89-117)/(52-89) 114/62 (10/08 0418) SpO2:  [96 %-100 %] 100 % (10/08 0418) Weight:  [122.3 kg] 122.3 kg (10/08 0500) Last BM Date : 10/16/23  Intake/Output from previous day: 10/07 0701 - 10/08 0700 In: 1295.4 [P.O.:240; I.V.:795.4] Out: 2705 [Urine:2600; Drains:55; Stool:50] Intake/Output this shift: No intake/output data recorded.  GEN: alert NAD Pulm: normal effort CV: RRR Abd: soft, appropriately tender, NPWT holding suction, ostomy in place with semisolid brown stool in pouch (50 mL documented), LLQ drain with amber/serous fluid (55 mL)   Lab Results:  Recent Labs    10/15/23 1026 10/17/23 0610  WBC 13.7* 14.0*  HGB 9.7* 8.8*  HCT 31.4* 27.9*  PLT 304 335    BMET Recent Labs    10/15/23 1026  NA 136  K 4.2  CL 105  CO2 25  GLUCOSE 127*  BUN 23  CREATININE 0.95  CALCIUM  8.1*   PT/INR Recent Labs    10/16/23 1149  LABPROT 15.5*  INR 1.2   ABG No results for input(s): PHART, HCO3 in the last 72 hours.  Invalid input(s): PCO2, PO2  Studies/Results: CT GUIDED SOFT TISSUE FLUID DRAIN BY PERC CATH Result Date: 10/16/2023 INDICATION: 63 year old with history of sigmoid bowel perforation with peritonitis. Status post sigmoid colon resection and colostomy. Patient has postoperative fluid collections. Patient presents for CT-guided drain placement. EXAM: CT-GUIDED PLACEMENT OF DRAINAGE CATHETER IN LEFT LOWER ABDOMINAL FLUID COLLECTION TECHNIQUE: Multidetector CT imaging of the abdomen and pelvis was performed following the standard protocol without IV contrast. RADIATION DOSE REDUCTION: This exam was performed according to the  departmental dose-optimization program which includes automated exposure control, adjustment of the mA and/or kV according to patient size and/or use of iterative reconstruction technique. MEDICATIONS: Moderate sedation ANESTHESIA/SEDATION: Moderate (conscious) sedation was employed during this procedure. A total of Versed  1 mg and Fentanyl  75 mcg was administered intravenously by the radiology nurse. Total intra-service moderate Sedation Time: 32 minutes. The patient's level of consciousness and vital signs were monitored continuously by radiology nursing throughout the procedure under my direct supervision. COMPLICATIONS: None immediate. PROCEDURE: Informed written consent was obtained from the patient after a thorough discussion of the procedural risks, benefits and alternatives. All questions were addressed. Maximal Sterile Barrier Technique was utilized including caps, mask, sterile gowns, sterile gloves, sterile drape, hand hygiene and skin antiseptic. A timeout was performed prior to the initiation of the procedure. Patient was placed supine. CT images through the lower abdomen and pelvis were obtained. The fluid collection in the anterior left lower quadrant was identified and targeted. Left lower abdomen was prepped with chlorhexidine  and sterile field was created. Skin was anesthetized using 1% lidocaine . Small incision was made. Using CT guidance, an 18 gauge trocar needle was directed into the left lower quadrant fluid collection. Amber colored fluid was aspirated. Superstiff Amplatz wire was advanced into the collection. Tract was dilated to accommodate a 10 [place] multipurpose drain. Fluid was collected and sent for culture. Drain was sutured to skin and attached to a suction bulb. Bandage was placed. FINDINGS: 24 French drain placed within the anterior left lower quadrant collection. Majority of the fluid was aspirated. Amber colored fluid was aspirated  from the drain. IMPRESSION: CT-guided placement  of drainage catheter in the anterior left lower abdominal fluid collection. Electronically Signed   By: Juliene Balder M.D.   On: 10/16/2023 20:17   CT ABDOMEN PELVIS W CONTRAST Result Date: 10/15/2023 EXAM: CT ABDOMEN AND PELVIS WITH CONTRAST 10/15/2023 09:38:00 PM TECHNIQUE: CT of the abdomen and pelvis was performed with the administration of intravenous contrast, 75mL (iohexol  (OMNIPAQUE ) 350 MG/ML injection 75 mL IOHEXOL  350 MG/ML SOLN). Multiplanar reformatted images are provided for review. Automated exposure control, iterative reconstruction, and/or weight-based adjustment of the mA/kV was utilized to reduce the radiation dose to as low as reasonably achievable. COMPARISON: Comparison is made to October 07, 2023. CLINICAL HISTORY: Abdominal pain, post-operative, with anticipation of post-operative abscess. FINDINGS: LOWER CHEST: Small bilateral pleural effusions are present. Trace pericardial effusion. Cardiac size within normal limits. LIVER: The liver is unremarkable. Cholelithiasis is present without superimposed pericholecystic inflammatory change. No intra or extrahepatic biliary ductal dilation. GALLBLADDER AND BILE DUCTS: Cholelithiasis is present without superimposed pericholecystic inflammatory change. No biliary ductal dilatation. SPLEEN: No acute abnormality. PANCREAS: No acute abnormality. ADRENAL GLANDS: No acute abnormality. KIDNEYS, URETERS AND BLADDER: No stones in the kidneys or ureters. No hydronephrosis. No perinephric or periureteral stranding. Urinary bladder contains gas, which is nonspecific and may relate to recent catheterization, though aggressive infection could appear similarly. GI AND BOWEL: Interval changes of sigmoid colectomy, left midabdominal descending colostomy, and Hartmann patch formation are identified. The stomach, small bowel, and colon are otherwise unremarkable, and there is no evidence of obstruction. Punctate free intraperitoneal gas is noted within the omentum  and right subdiaphragmatic region. Mesenteric edema and peritoneal enhancement are nonspecific and may represent postsurgical change and/or peritoneal inflammation. Multiple loculated rim-enhancing intraperitoneal fluid collections are identified, measuring 2.9 x 2.7 cm at image 49, series 3, and 2.2 x 3.8 cm at image 57, series 3, with growth within the deep small bowel mesentery. Within the pelvis, 3 separate fluid collections were identified, all seen on image 72, series 3, measuring 4.5 x 7.4 cm, 3.4 x 4.5 cm, and 4.3 x 7.5 cm. The appendix is not clearly identified and may be absent. PERITONEUM AND RETROPERITONEUM: There are multiple loculated rim-enhancing intraperitoneal fluid collections and punctate free intraperitoneal gas. Mesenteric edema and peritoneal enhancement are present. VASCULATURE: Aorta is normal in caliber. LYMPH NODES: No lymphadenopathy. REPRODUCTIVE ORGANS: No acute abnormality. BONES AND SOFT TISSUES: Osseous structures are age-appropriate. No acute bone abnormality. No lytic or blastic bone lesion. Superficial dehiscence of the midline laparotomy incision is noted. There is moderate subcutaneous edema within the flanks bilaterally. IMPRESSION: 1. Multiple loculated rim-enhancing intraperitoneal fluid collections, concerning for post-operative abscesses. 2. Punctate free intraperitoneal gas, mesenteric edema, and peritoneal enhancement, possibly representing postsurgical change and/or peritoneal inflammation. 3. Anasarca with body wall edema, Small bilateral pleural effusions and trace pericardial effusion. 4. Gas within the bladder lumen, nonspecific. Correlation with urinalysis may be helpful for further management. Electronically signed by: Dorethia Molt MD 10/15/2023 10:51 PM EDT RP Workstation: [id number]    Anti-infectives: Anti-infectives (From admission, onward)    Start     Dose/Rate Route Frequency Ordered Stop   10/07/23 2200  piperacillin -tazobactam (ZOSYN ) IVPB 3.375 g        Placed in Followed by Linked Group   3.375 g 12.5 mL/hr over 240 Minutes Intravenous Every 8 hours 10/07/23 1351     10/07/23 1732  vancomycin variable dose per unstable renal function (pharmacist dosing)  Status:  Discontinued  Does not apply See admin instructions 10/07/23 1732 10/08/23 0825   10/07/23 1545  micafungin (MYCAMINE) 150 mg in sodium chloride  0.9 % 100 mL IVPB        150 mg 107.5 mL/hr over 1 Hours Intravenous Every 24 hours 10/07/23 1530     10/07/23 1530  metroNIDAZOLE  (FLAGYL ) IVPB 500 mg  Status:  Discontinued        500 mg 100 mL/hr over 60 Minutes Intravenous Every 12 hours 10/07/23 1518 10/07/23 1532   10/07/23 1530  vancomycin (VANCOREADY) IVPB 2000 mg/400 mL        2,000 mg 200 mL/hr over 120 Minutes Intravenous  Once 10/07/23 1526 10/07/23 2033   10/07/23 1400  metroNIDAZOLE  (FLAGYL ) IVPB 500 mg        500 mg 100 mL/hr over 60 Minutes Intravenous  Once 10/07/23 1346 10/07/23 1507   10/07/23 1400  piperacillin -tazobactam (ZOSYN ) IVPB 3.375 g       Placed in Followed by Linked Group   3.375 g 100 mL/hr over 30 Minutes Intravenous  Once 10/07/23 1351 10/07/23 1502       Assessment/Plan: s/p Procedure(s): LAPAROTOMY, EXPLORATORY (N/A) EXCISION, SMALL INTESTINE (N/A) Advance diet. Allow soft food today OOB/ PT Sigmoid bowel perforation with feculent peritonitis, likely due to stercoral colitis vs diverticulitis POD#10 s/p  LAPAROTOMY, EXPLORATORY, Sigmoid colon resection, Hartman's colostomy 9.28 Dr. Rubin - soft diet, started weaning TNA 10/5, calorie count today - CT scan 10/6 shows multiple pelvis abscesses, s/p IR perc drain 10/7; Cx NGTD, follow. - Vac change Mon/Thurs; appreciate WOC RN assistance with pouch leakage/close proximity to midline wound. May ultimately need to go home on wet-to-dry dressing changes but would like to try to continue [facility] for now.   FEN: Soft, 1/2 TPN, calorie count ID: Zosyn , Micafungin >> will discuss abx  duration with MD, Cx from pelvic fluid collections NGTD, tentative plan to stop abx after a total of 14 days (10/12) VTE: SCD's, SQH Foley: purewick; D/C and encourage bedside commode  Dispo: progressive care   ANCA associated vasculitis/ILD - on methotrexate /cellcept  at baseline, held now PMH ulcerative keratitis  DM2 NICM Anxiety PMH AKI/ATN 2021 after admission for diverticulitis, managed non-op with IR drain. Nephrologist: [name]  LOS: 10 days    Almarie RAMAN Julena Barbour 10/17/2023

## 2023-10-17 NOTE — Progress Notes (Signed)
 Occupational Therapy Treatment Patient Details Name: [name] MRN: [id number] DOB: 26-Feb-1960 Today's Date: 10/17/2023   History of present illness Pt is a 63 y/o female presenting on 9/28 via EMS tachypneic, tachycardic and hypotensive, with complaints of left sided abdominal pain that started 7 days prior. CT abdomen pelvis was obtained which revealed marked sigmoid colitis with perforation. Admitted for treatment of Septic shock secondary to sigmoid bowel perforation with feculent peritonitis. S/p 9/28 laparotomy sigmoid resection, colostomy. Intra abdominal abscess; s/p LLQ abdominal drain placement 10/16/23. Extensive past medical history not limited to ANCA associated vasculitis, previous diffuse pulmonary alveolar hemorrhage, chronic immunosuppression type 2 diabetes, HTN, HLD, ILD, previous sigmoid colon perforation, anxiety.   OT comments  Pt progressing well towards goals. Progressed transfer to toilet with min assist +2 for safety, and toilet hygiene with supervision while seated. Limited by decreased strength and balance, requiring max +2 assist to rise from low height of toilet. Discussed with pt and updated d/c recs to >3 hours of skilled rehab daily with desire to return to mod I. Will continue to follow acutely.       If plan is discharge home, recommend the following:  A lot of help with bathing/dressing/bathroom;Assistance with cooking/housework;Assist for transportation;Help with stairs or ramp for entrance;A little help with walking and/or transfers   Equipment Recommendations  BSC/3in1    Recommendations for Other Services Rehab consult    Precautions / Restrictions Precautions Precautions: Fall Recall of Precautions/Restrictions: Impaired Precaution/Restrictions Comments: Wound vac, TPN, abdominal protection precs; LLQ JP drain Restrictions Weight Bearing Restrictions Per Provider Order: No       Mobility Bed Mobility Overal bed mobility: Needs  Assistance Bed Mobility: Sit to Supine, Supine to Sit     Supine to sit: Mod assist, HOB elevated, Used rails     General bed mobility comments: Pt able to bring BLEs off bed, assist for trunk    Transfers Overall transfer level: Needs assistance Equipment used: Rolling walker (2 wheels) Transfers: Sit to/from Stand Sit to Stand: From elevated surface, Min assist, +2 safety/equipment, Mod assist     Step pivot transfers: Max assist, +2 physical assistance, +2 safety/equipment     General transfer comment: from EOB with min assist, from low toilet height pt requiring max +2 assist to rise     Balance Overall balance assessment: Needs assistance Sitting-balance support: No upper extremity supported, Feet supported Sitting balance-Leahy Scale: Fair     Standing balance support: Bilateral upper extremity supported, During functional activity, Reliant on assistive device for balance Standing balance-Leahy Scale: Poor Standing balance comment: reliant on RW support       ADL either performed or assessed with clinical judgement   ADL Overall ADL's : Needs assistance/impaired     Toilet Transfer: Minimal assistance;+2 for safety/equipment;Rolling walker (2 wheels);Maximal assistance;+2 for physical assistance;Regular Toilet;Ambulation;Grab bars Toilet Transfer Details (indicate cue type and reason): Min assist from bed height, max +2 from low toilet heavy reliance on GB Toileting- Clothing Manipulation and Hygiene: Supervision/safety;Sitting/lateral lean       Functional mobility during ADLs: Minimal assistance;+2 for safety/equipment General ADL Comments: Decreased strength when standing from low height, poor activity tolerance    Extremity/Trunk Assessment Upper Extremity Assessment Upper Extremity Assessment: Generalized weakness   Lower Extremity Assessment Lower Extremity Assessment: Defer to PT evaluation        Vision   Vision Assessment?: No apparent visual  deficits         Communication Communication Communication: No apparent difficulties  Cognition Arousal: Alert Behavior During Therapy: Anxious     Following commands: Impaired Following commands impaired: Follows one step commands with increased time, Follows multi-step commands inconsistently      Cueing   Cueing Techniques: Verbal cues, Gestural cues, Visual cues        General Comments VSS on RA, pt reporting mild dizziness with mobility    Pertinent Vitals/ Pain       Pain Assessment Pain Assessment: Faces Faces Pain Scale: Hurts little more Pain Location: abdomen Pain Descriptors / Indicators: Discomfort, Operative site guarding, Tightness Pain Intervention(s): Limited activity within patient's tolerance   Frequency  Min 2X/week        Progress Toward Goals  OT Goals(current goals can now be found in the care plan section)  Progress towards OT goals: Progressing toward goals  Acute Rehab OT Goals Patient Stated Goal: To get better OT Goal Formulation: With patient Time For Goal Achievement: 10/24/23 Potential to Achieve Goals: Good ADL Goals Pt Will Perform Grooming: with supervision;standing Pt Will Perform Lower Body Dressing: with min assist;sitting/lateral leans;sit to/from stand;with adaptive equipment Pt Will Transfer to Toilet: ambulating;bedside commode;with min assist Pt Will Perform Toileting - Clothing Manipulation and hygiene: with contact guard assist;sit to/from stand;sitting/lateral leans Additional ADL Goal #1: Pt will recall and complete 3 step ADl task with no more than minimal cueing.  Plan      Co-evaluation    PT/OT/SLP Co-Evaluation/Treatment: Yes            AM-PAC OT 6 Clicks Daily Activity     Outcome Measure   Help from another person eating meals?: None Help from another person taking care of personal grooming?: A Little Help from another person toileting, which includes using toliet, bedpan, or urinal?: A  Little Help from another person bathing (including washing, rinsing, drying)?: A Lot Help from another person to put on and taking off regular upper body clothing?: A Little Help from another person to put on and taking off regular lower body clothing?: A Lot 6 Click Score: 17    End of Session Equipment Utilized During Treatment: Gait belt;Rolling walker (2 wheels)  OT Visit Diagnosis: Other abnormalities of gait and mobility (R26.89);Muscle weakness (generalized) (M62.81);Pain;Other symptoms and signs involving cognitive function   Activity Tolerance Patient tolerated treatment well   Patient Left in chair;with call bell/phone within reach;with chair alarm set;with nursing/sitter in room   Nurse Communication Mobility status        Time: 1421-1456 OT Time Calculation (min): 35 min  Charges: OT General Charges $OT Visit: 1 Visit OT Treatments $Self Care/Home Management : 8-22 mins  Adrianne BROCKS, OT  Acute Rehabilitation Services Office [phone] Secure chat preferred   Adrianne GORMAN Savers 10/17/2023, 4:14 PM

## 2023-10-17 NOTE — Progress Notes (Signed)
 PROGRESS NOTE    [name]  FMW:[id number] DOB: 1961-01-02 DOA: 10/07/2023 PCP: Lynwood Laneta ORN, PA-C    Brief Narrative:   63 year old female with past medical history significant for ANCA vasculitis, chronic immunosuppression, type 2 diabetes, hypertension, hyperlipidemia, ILD and previous sigmoid colon perforation who presented to the ER via EMS with left-sided abdominal pain that began 7 days prior to admission.  On arrival patient was hypothermic and admitted with fever, tachypnea, tachycardic and hypotensive.  Workup revealed mild sigmoid colitis with perforation.  Patient admitted to ICU for septic shock.  She underwent exploratory laparotomy on 9/28, sigmoid bowel perforation with feculent peritonitis noted.  She was transferred to [facility] on 10/4.  She remains on TPN.  Has been requiring frequent pain medications and is noted to have softer blood pressures but blood pressure seems to have improved in the past several days, remains on midodrine.  Slowly she has regained her bowel function.  CT 10/6 shows multiple abscess in the abdomen, plan for IR drainage today.   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  Has wound VAC to abdominal wound.  Patient is now on TPN and requiring pain management.  Managed by general surgery. -Repeat CT abdomen/pelvis 10/6 showed multiple intraperitoneal abscess requiring drain placement by IR on 10/7. -Antibiotics per general surgery, currently on Zosyn  -Will continue to wean down midodrine   ANCA associated vasculitis/ILD  on methotrexate /CellCept  at baseline, currently on hold due to infection  continue incruse   Diabetes mellitus type 2  Hemoglobin A1c is 4.6 on 10/07/2023.  Blood sugars reading are in acceptable range.   NICM   On Coreg , Entresto  PTA.  Will continue to hold at this time due to hypotension issues.  Resume  gradually as appropriate.   Borderline hyperkalemia  Will recheck labs intermittently.   Anxiety  Continue alprazolam , bupropion , fluoxetine   Physical deconditioning: PT OT following.   DVT prophylaxis: heparin  injection 5,000 Units Start: 10/09/23 0945 SCDs Start: 10/07/23 1529      Code Status: Full Code Family Communication:   Status is: Inpatient Remains inpatient appropriate because: On going post op management.    PT Follow up Recs: Home Health Pt (Pending Progress)10/15/2023 1200  Subjective:  Seen at bedside, tells me occasionally she has abdominal pain but over no complaints  Examination:  General exam: Appears calm and comfortable  Respiratory system: Clear to auscultation. Respiratory effort normal. Cardiovascular system: S1 & S2 heard, RRR. No JVD, murmurs, rubs, gallops or clicks. No pedal edema. Gastrointestinal system: Abdomen is nondistended, soft and nontender. No organomegaly or masses felt. Normal bowel sounds heard. Central nervous system: Alert and oriented. No focal neurological deficits. Extremities: Symmetric 5 x 5 power. Skin: Surgical wound in the abdomen noted, left lower quadrant drain with sanguinous fluid.  Ostomy in place Psychiatry: Judgement and insight appear normal. Mood & affect appropriate. Right upper extremity PICC line in place               Diet Orders (From admission, onward)     Start     Ordered   10/16/23 1549  DIET SOFT Room service appropriate? Yes; Fluid consistency: Thin  Diet effective now       Question Answer Comment  Room service appropriate? Yes   Fluid consistency: Thin      10/16/23 1548            Objective: Vitals:  10/17/23 0500 10/17/23 0805 10/17/23 0859 10/17/23 1135  BP:  101/61    Pulse:  67    Resp:  12    Temp:  97.6 F (36.4 C)    TempSrc:  Oral  Oral  SpO2:  96% 97%   Weight: 122.3 kg     Height:        Intake/Output Summary (Last 24 hours) at 10/17/2023 1357 Last data  filed at 10/17/2023 0942 Gross per 24 hour  Intake 1430.39 ml  Output 1705 ml  Net -274.61 ml   Filed Weights   10/14/23 0350 10/16/23 0702 10/17/23 0500  Weight: 122.4 kg 121.6 kg 122.3 kg    Scheduled Meds:  acetaminophen   1,000 mg Oral QID   ascorbic acid  250 mg Oral BID   buPROPion   450 mg Oral Daily   Chlorhexidine  Gluconate Cloth  6 each Topical Daily   docusate sodium   100 mg Oral BID   feeding supplement  237 mL Oral BID BM   FLUoxetine  40 mg Oral Daily   heparin  injection (subcutaneous)  5,000 Units Subcutaneous Q8H   lidocaine   2 patch Transdermal Q24H   methocarbamol  1,000 mg Oral QID   midodrine  10 mg Oral TID WC   multivitamin with minerals  1 tablet Oral Daily   mupirocin ointment   Nasal BID   nutrition supplement (JUVEN)  1 packet Oral BID BM   ondansetron  (ZOFRAN ) IV  4 mg Intravenous Q6H   oxyCODONE   5 mg Oral Once   polyethylene glycol  17 g Oral Daily   pregabalin  50 mg Oral BID   sodium chloride  flush  10-40 mL Intracatheter Q12H   umeclidinium bromide  1 puff Inhalation Daily   vitamin A  10,000 Units Oral Daily   Continuous Infusions:  dextrose  75 mL/hr at 10/17/23 0048   micafungin (MYCAMINE) 150 mg in sodium chloride  0.9 % 100 mL IVPB 150 mg (10/16/23 2311)   piperacillin -tazobactam 3.375 g (10/17/23 0558)   TPN ADULT (ION) Stopped (10/16/23 2211)   TPN ADULT (ION)      Nutritional status Signs/Symptoms: NPO status Interventions: TPN Body mass index is 39.82 kg/m.  Data Reviewed:   CBC: Recent Labs  Lab 10/11/23 1015 10/11/23 1142 10/15/23 1026 10/17/23 0610  WBC 11.5* 12.0* 13.7* 14.0*  NEUTROABS  --   --   --  10.8*  HGB 10.7* 11.2* 9.7* 8.8*  HCT 33.6* 35.4* 31.4* 27.9*  MCV 91.3 93.2 90.2 89.4  PLT 309 317 304 335   Basic Metabolic Panel: Recent Labs  Lab 10/11/23 1330 10/12/23 0345 10/13/23 0617 10/14/23 0500 10/15/23 1026  NA 135 136 139 137 136  K 4.7 4.7 5.2* 5.0 4.2  CL 104 104 108 104 105  CO2 22 22 24  23 25   GLUCOSE 134* 114* 88 112* 127*  BUN 36* 37* 32* 26* 23  CREATININE 1.29* 1.22* 0.99 0.96 0.95  CALCIUM  8.2* 7.8* 7.9* 8.1* 8.1*  MG 1.9 2.2 1.8 1.9 1.9  PHOS 2.7 2.6 4.1 3.9 3.4   GFR: Estimated Creatinine Clearance: 85.9 mL/min (by C-G formula based on SCr of 0.95 mg/dL). Liver Function Tests: Recent Labs  Lab 10/11/23 1330 10/12/23 0345 10/15/23 1026  AST 18  --  15  ALT 11  --  10  ALKPHOS 63  --  85  BILITOT 0.4  --  0.6  PROT 6.0*  --  6.3*  ALBUMIN  2.0* 1.6* 1.7*   No results for input(s):  LIPASE, AMYLASE in the last 168 hours. No results for input(s): AMMONIA in the last 168 hours. Coagulation Profile: Recent Labs  Lab 10/16/23 1149  INR 1.2   Cardiac Enzymes: No results for input(s): CKTOTAL, CKMB, CKMBINDEX, TROPONINI in the last 168 hours. BNP (last 3 results) No results for input(s): PROBNP in the last 8760 hours. HbA1C: No results for input(s): HGBA1C in the last 72 hours. CBG: Recent Labs  Lab 10/16/23 0913 10/16/23 1537 10/16/23 2318 10/17/23 0629 10/17/23 1030  GLUCAP 113* 102* 100* 101* 105*   Lipid Profile: Recent Labs    10/15/23 0420 10/15/23 1026  TRIG SPECIMEN CONTAMINATED, UNABLE TO PERFORM TEST(S). 86   Thyroid Function Tests: No results for input(s): TSH, T4TOTAL, FREET4, T3FREE, THYROIDAB in the last 72 hours. Anemia Panel: No results for input(s): VITAMINB12, FOLATE, FERRITIN, TIBC, IRON, RETICCTPCT in the last 72 hours. Sepsis Labs: No results for input(s): PROCALCITON, LATICACIDVEN in the last 168 hours.  Recent Results (from the past 240 hours)  Culture, blood (routine x 2)     Status: None   Collection Time: 10/07/23  2:03 PM   Specimen: BLOOD LEFT ARM  Result Value Ref Range Status   Specimen Description BLOOD LEFT ARM  Final   Special Requests   Final    BOTTLES DRAWN AEROBIC AND ANAEROBIC Blood Culture results may not be optimal due to an inadequate volume of blood  received in culture bottles   Culture   Final    NO GROWTH 5 DAYS Performed at [facility], [address] [place], [place] [address]    Report Status 10/12/2023 FINAL  Final  Culture, blood (routine x 2)     Status: None   Collection Time: 10/07/23  2:04 PM   Specimen: BLOOD LEFT ARM  Result Value Ref Range Status   Specimen Description BLOOD LEFT ARM  Final   Special Requests   Final    BOTTLES DRAWN AEROBIC AND ANAEROBIC Blood Culture results may not be optimal due to an inadequate volume of blood received in culture bottles   Culture   Final    NO GROWTH 5 DAYS Performed at [facility], [address], [place], [place] [address]    Report Status 10/12/2023 FINAL  Final  MRSA Next Gen by PCR, Nasal     Status: Abnormal   Collection Time: 10/07/23  3:28 PM   Specimen: Nasal Mucosa; Nasal Swab  Result Value Ref Range Status   MRSA by PCR Next Gen DETECTED (A) NOT DETECTED Final    Comment: (NOTE) The GeneXpert MRSA Assay (FDA approved for NASAL specimens only), is one component of a comprehensive MRSA colonization surveillance program. It is not intended to diagnose MRSA infection nor to guide or monitor treatment for MRSA infections. Test performance is not FDA approved in patients less than 43 years old. Performed at [facility], [address], [place], [place] [address]   Aerobic/Anaerobic Culture w Gram Stain (surgical/deep wound)     Status: None (Preliminary result)   Collection Time: 10/16/23  3:09 PM   Specimen: Abdomen; Body Fluid  Result Value Ref Range Status   Specimen Description ABDOMEN FLUID  Final   Special Requests NONE  Final   Gram Stain   Final    RARE WBC PRESENT, PREDOMINANTLY PMN NO ORGANISMS SEEN    Culture   Final    NO GROWTH < 24 HOURS Performed at [facility], [address] [place],  [place] [address]    Report Status PENDING  Incomplete         Radiology Studies: CT GUIDED SOFT TISSUE FLUID DRAIN BY  PERC CATH Result Date: 10/16/2023 INDICATION: 63 year old with history of sigmoid bowel perforation with peritonitis. Status post sigmoid colon resection and colostomy. Patient has postoperative fluid collections. Patient presents for CT-guided drain placement. EXAM: CT-GUIDED PLACEMENT OF DRAINAGE CATHETER IN LEFT LOWER ABDOMINAL FLUID COLLECTION TECHNIQUE: Multidetector CT imaging of the abdomen and pelvis was performed following the standard protocol without IV contrast. RADIATION DOSE REDUCTION: This exam was performed according to the departmental dose-optimization program which includes automated exposure control, adjustment of the mA and/or kV according to patient size and/or use of iterative reconstruction technique. MEDICATIONS: Moderate sedation ANESTHESIA/SEDATION: Moderate (conscious) sedation was employed during this procedure. A total of Versed  1 mg and Fentanyl  75 mcg was administered intravenously by the radiology nurse. Total intra-service moderate Sedation Time: 32 minutes. The patient's level of consciousness and vital signs were monitored continuously by radiology nursing throughout the procedure under my direct supervision. COMPLICATIONS: None immediate. PROCEDURE: Informed written consent was obtained from the patient after a thorough discussion of the procedural risks, benefits and alternatives. All questions were addressed. Maximal Sterile Barrier Technique was utilized including caps, mask, sterile gowns, sterile gloves, sterile drape, hand hygiene and skin antiseptic. A timeout was performed prior to the initiation of the procedure. Patient was placed supine. CT images through the lower abdomen and pelvis were obtained. The fluid collection in the anterior left lower quadrant was identified and targeted. Left lower abdomen was prepped with chlorhexidine  and sterile field was created. Skin was anesthetized using 1% lidocaine . Small incision was made. Using CT guidance, an 18 gauge trocar  needle was directed into the left lower quadrant fluid collection. Amber colored fluid was aspirated. Superstiff Amplatz wire was advanced into the collection. Tract was dilated to accommodate a 10 [place] multipurpose drain. Fluid was collected and sent for culture. Drain was sutured to skin and attached to a suction bulb. Bandage was placed. FINDINGS: 62 French drain placed within the anterior left lower quadrant collection. Majority of the fluid was aspirated. Amber colored fluid was aspirated from the drain. IMPRESSION: CT-guided placement of drainage catheter in the anterior left lower abdominal fluid collection. Electronically Signed   By: Juliene Balder M.D.   On: 10/16/2023 20:17   CT ABDOMEN PELVIS W CONTRAST Result Date: 10/15/2023 EXAM: CT ABDOMEN AND PELVIS WITH CONTRAST 10/15/2023 09:38:00 PM TECHNIQUE: CT of the abdomen and pelvis was performed with the administration of intravenous contrast, 75mL (iohexol  (OMNIPAQUE ) 350 MG/ML injection 75 mL IOHEXOL  350 MG/ML SOLN). Multiplanar reformatted images are provided for review. Automated exposure control, iterative reconstruction, and/or weight-based adjustment of the mA/kV was utilized to reduce the radiation dose to as low as reasonably achievable. COMPARISON: Comparison is made to October 07, 2023. CLINICAL HISTORY: Abdominal pain, post-operative, with anticipation of post-operative abscess. FINDINGS: LOWER CHEST: Small bilateral pleural effusions are present. Trace pericardial effusion. Cardiac size within normal limits. LIVER: The liver is unremarkable. Cholelithiasis is present without superimposed pericholecystic inflammatory change. No intra or extrahepatic biliary ductal dilation. GALLBLADDER AND BILE DUCTS: Cholelithiasis is present without superimposed pericholecystic inflammatory change. No biliary ductal dilatation. SPLEEN: No acute abnormality. PANCREAS: No acute abnormality. ADRENAL GLANDS: No acute abnormality. KIDNEYS, URETERS AND BLADDER:  No stones in the kidneys or ureters. No hydronephrosis. No perinephric or periureteral stranding. Urinary bladder contains gas, which is nonspecific and may relate to recent catheterization, though  aggressive infection could appear similarly. GI AND BOWEL: Interval changes of sigmoid colectomy, left midabdominal descending colostomy, and Hartmann patch formation are identified. The stomach, small bowel, and colon are otherwise unremarkable, and there is no evidence of obstruction. Punctate free intraperitoneal gas is noted within the omentum and right subdiaphragmatic region. Mesenteric edema and peritoneal enhancement are nonspecific and may represent postsurgical change and/or peritoneal inflammation. Multiple loculated rim-enhancing intraperitoneal fluid collections are identified, measuring 2.9 x 2.7 cm at image 49, series 3, and 2.2 x 3.8 cm at image 57, series 3, with growth within the deep small bowel mesentery. Within the pelvis, 3 separate fluid collections were identified, all seen on image 72, series 3, measuring 4.5 x 7.4 cm, 3.4 x 4.5 cm, and 4.3 x 7.5 cm. The appendix is not clearly identified and may be absent. PERITONEUM AND RETROPERITONEUM: There are multiple loculated rim-enhancing intraperitoneal fluid collections and punctate free intraperitoneal gas. Mesenteric edema and peritoneal enhancement are present. VASCULATURE: Aorta is normal in caliber. LYMPH NODES: No lymphadenopathy. REPRODUCTIVE ORGANS: No acute abnormality. BONES AND SOFT TISSUES: Osseous structures are age-appropriate. No acute bone abnormality. No lytic or blastic bone lesion. Superficial dehiscence of the midline laparotomy incision is noted. There is moderate subcutaneous edema within the flanks bilaterally. IMPRESSION: 1. Multiple loculated rim-enhancing intraperitoneal fluid collections, concerning for post-operative abscesses. 2. Punctate free intraperitoneal gas, mesenteric edema, and peritoneal enhancement, possibly  representing postsurgical change and/or peritoneal inflammation. 3. Anasarca with body wall edema, Small bilateral pleural effusions and trace pericardial effusion. 4. Gas within the bladder lumen, nonspecific. Correlation with urinalysis may be helpful for further management. Electronically signed by: Dorethia Molt MD 10/15/2023 10:51 PM EDT RP Workstation: [id number]           LOS: 10 days   Time spent= 35 mins    Burgess JAYSON Dare, MD [facility]  If 7PM-7AM, please contact night-coverage  10/17/2023, 1:57 PM

## 2023-10-17 NOTE — Progress Notes (Addendum)
 Physical Therapy Treatment Patient Details Name: [name] MRN: [id number] DOB: May 22, 1960 Today's Date: 10/17/2023   History of Present Illness Pt is a 63 y/o female presenting on 9/28 via EMS tachypneic, tachycardic and hypotensive, with complaints of left sided abdominal pain that started 7 days prior. CT abdomen pelvis was obtained which revealed marked sigmoid colitis with perforation. Admitted for treatment of Septic shock secondary to sigmoid bowel perforation with feculent peritonitis. S/p 9/28 laparotomy sigmoid resection, colostomy. Intra abdominal abscess; s/p LLQ abdominal drain placement 10/16/23. Extensive past medical history not limited to ANCA associated vasculitis, previous diffuse pulmonary alveolar hemorrhage, chronic immunosuppression type 2 diabetes, HTN, HLD, ILD, previous sigmoid colon perforation, anxiety.    PT Comments  Pt received in supine, drowsy after premedication by RN, pt agreeable to therapy session with encouragement, needing increased time to initiate due to c/o pain/fatigue, more alert once EOB/standing. Pt with slightly improved attention to task this date, able to perform transfers from lower toilet height with +2 maxA and was unable to stand from low surface with only +1 assist and wall rail after multiple attempts. Pt needing consistent CGA to light minA for gait safety with +2 assist due to multiple lines, pt c/o fatigue and offered chair to sit in but defers, able to perform short household distance gait trial this date, an improvement from previous session. Pt agreeable to sit up in chair with max encouragement as she has not been OOB to chair yet this week. RN arriving to give meds at end of session and notified of pt wishes not to sit up >1 hour. Patient will benefit from intensive inpatient follow-up therapy, >3 hours/day, pt reports she has assist at home from her sister and making good progress toward her goals this session, discussed with supervising PT  Ryan L and DME clarified below also per discussion.     If plan is discharge home, recommend the following: A lot of help with walking and/or transfers;A lot of help with bathing/dressing/bathroom;Assistance with cooking/housework;Direct supervision/assist for medications management;Direct supervision/assist for financial management;Assist for transportation;Supervision due to cognitive status   Can travel by private vehicle        Equipment Recommendations  BSC/3in1 (bariatric width due to body habitus)    Recommendations for Other Services Rehab consult     Precautions / Restrictions Precautions Precautions: Fall Recall of Precautions/Restrictions: Impaired Precaution/Restrictions Comments: Wound vac, TPN, abdominal protection precs; LLQ JP drain Restrictions Weight Bearing Restrictions Per Provider Order: No     Mobility  Bed Mobility Overal bed mobility: Needs Assistance Bed Mobility: Supine to Sit     Supine to sit: Mod assist, HOB elevated, Used rails     General bed mobility comments: pt educated on abdominal protection with log roll technique but pt declines voicing Let me just show you how I do it, need up to mod assist for trunk support and therapist encouraging partial roll to protect abd.    Transfers Overall transfer level: Needs assistance Equipment used: Rolling walker (2 wheels) (bariatric) Transfers: Sit to/from Stand Sit to Stand: From elevated surface, Min assist, +2 safety/equipment, Max assist           General transfer comment: from EOB with min assist, from low toilet seat with wall rail and +2 maxA to RW after x 5 unsuccessful attempts with +1 min to modA. RW>chair with cues for safe UE placement.    Ambulation/Gait Ambulation/Gait assistance: Contact guard assist, Min assist, +2 safety/equipment Gait Distance (Feet): 75 Feet Assistive  device: Rolling walker (2 wheels) (bariatric) Gait Pattern/deviations: Step-through pattern, Decreased  step length - right, Decreased step length - left, Trunk flexed, Wide base of support Gait velocity: slowed Gait velocity interpretation: <1.31 ft/sec, indicative of household ambulator   General Gait Details: CGA to Min physical assist for RW management and steadying, esp when using rollator; ~19ft to bathroom with bari RW, then ~16ft with bari rollator. Second person for line assist and preparing to offer chair follow PRN for safety; vc for sequencing, posture and activity pacing. Pt reports bari RW feels more stable to her than rollator, case mgmt notified after session.   Stairs             Wheelchair Mobility     Tilt Bed    Modified Rankin (Stroke Patients Only)       Balance Overall balance assessment: Needs assistance Sitting-balance support: No upper extremity supported, Feet supported Sitting balance-Leahy Scale: Fair     Standing balance support: Bilateral upper extremity supported, During functional activity, Reliant on assistive device for balance Standing balance-Leahy Scale: Poor Standing balance comment: reliant on RW support for dynamic standing tasks                            Communication Communication Communication: No apparent difficulties  Cognition Arousal: Alert Behavior During Therapy: Anxious   PT - Cognitive impairments: Sequencing, Problem solving, Attention                       PT - Cognition Comments: Slightly improved attention to task this date, but has difficulty following 2-step commands, especially when fatigued (trying to stand from low toilet surface). Following commands: Impaired Following commands impaired: Follows one step commands with increased time, Follows multi-step commands inconsistently    Cueing Cueing Techniques: Verbal cues, Gestural cues, Visual cues  Exercises      General Comments General comments (skin integrity, edema, etc.): VSS on RA, pt reporting mild dizziness with mobility       Pertinent Vitals/Pain Pain Assessment Pain Assessment: Faces Faces Pain Scale: Hurts little more Pain Location: abdomen Pain Descriptors / Indicators: Discomfort, Operative site guarding, Tightness Pain Intervention(s): Monitored during session, Repositioned, Premedicated before session    Home Living                          Prior Function            PT Goals (current goals can now be found in the care plan section) Acute Rehab PT Goals Patient Stated Goal: Get back home and to take care of my mother PT Goal Formulation: With patient Time For Goal Achievement: 10/24/23 Progress towards PT goals: Progressing toward goals    Frequency    Min 2X/week      PT Plan      Co-evaluation PT/OT/SLP Co-Evaluation/Treatment: Yes Reason for Co-Treatment: To address functional/ADL transfers;Complexity of the patient's impairments (multi-system involvement) PT goals addressed during session: Mobility/safety with mobility;Balance;Proper use of DME        AM-PAC PT 6 Clicks Mobility   Outcome Measure  Help needed turning from your back to your side while in a flat bed without using bedrails?: A Little Help needed moving from lying on your back to sitting on the side of a flat bed without using bedrails?: A Lot Help needed moving to and from a bed to a chair (including a wheelchair)?:  A Little Help needed standing up from a chair using your arms (e.g., wheelchair or bedside chair)?: A Lot Help needed to walk in hospital room?: A Little Help needed climbing 3-5 steps with a railing? : Total 6 Click Score: 14    End of Session Equipment Utilized During Treatment: Gait belt Activity Tolerance: Patient tolerated treatment well Patient left: in chair;with call bell/phone within reach;with chair alarm set;Other (comment) (pt requesting to keep her feet down) Nurse Communication: Mobility status;Patient requests pain meds PT Visit Diagnosis: Unsteadiness on feet  (R26.81);Other abnormalities of gait and mobility (R26.89);Muscle weakness (generalized) (M62.81);Difficulty in walking, not elsewhere classified (R26.2);Pain Pain - part of body:  (abdomen)     Time: 8578-8543 PT Time Calculation (min) (ACUTE ONLY): 35 min  Charges:    $Gait Training: 8-22 mins PT General Charges $$ ACUTE PT VISIT: 1 Visit                     Keelyn Fjelstad P., PTA Acute Rehabilitation Services Secure Chat Preferred 9a-5:30pm Office: [phone]    Connell HERO [facility] 10/17/2023, 5:03 PM

## 2023-10-17 NOTE — Progress Notes (Signed)
 Patient ID: [name], female   DOB: November 15, 1960, 63 y.o.   MRN: [id number]    Referring Physician(s): E. Siman PA   Supervising Physician: Vanice Revel  Patient Status:  [facility] - In-pt  Chief Complaint:  Intra abdominal abscess; s/p LLQ abdominal drain placement 10/16/23  Subjective:  Pt doing well. Reports some mild soreness at drain insertion site in LLQ. Otherwise no additional complaint. Tolerating po, ordering more food when evaluating in room. All questions answered.  Allergies: Mucinex [guaifenesin er], Sulfa antibiotics, and Glucophage [metformin]  Medications: Prior to Admission medications   Medication Sig Start Date End Date Taking? Authorizing Provider  ALPRAZolam  (XANAX ) 1 MG tablet Take 1 tablet (1 mg total) by mouth 3 (three) times daily as needed for anxiety. Patient taking differently: Take 0.5-1.5 mg by mouth See admin instructions. Take 1-1.5 tablets (1-1.5mg ) by mouth every night at bedtime and take 0.5-1 tablets (0.5-1mg ) twice daily as needed for anxiety. 06/30/19  Yes Odell Celinda Balo, MD  buPROPion  (WELLBUTRIN  XL) 150 MG 24 hr tablet Take 450 mg by mouth daily.   Yes [provider]  carvedilol  (COREG ) 12.5 MG tablet Take 1 tablet (12.5 mg total) by mouth 2 (two) times daily. 07/10/23 11/17/23 Yes Wyn Jackee VEAR Mickey., NP  ergocalciferol (VITAMIN D2) 1.25 MG (50000 UT) capsule Take 50,000 Units by mouth every Sunday.   Yes [provider]  FLUoxetine (PROZAC) 40 MG capsule Take 40 mg by mouth daily.   Yes [provider]  folic acid  (FOLVITE ) 1 MG tablet Take 1 mg by mouth daily.    Yes [provider]  methotrexate  50 MG/2ML injection Inject 15 mg into the vein every Sunday.   Yes [provider]  mycophenolate  (CELLCEPT ) 500 MG tablet Take 3 tablets (1,500 mg total) by mouth 2 (two) times daily. 07/14/19  Yes Odell Celinda Balo, MD  nitroGLYCERIN  (NITROSTAT ) 0.4 MG SL tablet Place 1 tablet (0.4 mg total) under the  tongue every 5 (five) minutes as needed for chest pain. 09/22/21 11/17/23 Yes Wyn Jackee VEAR Mickey., NP  polyethylene glycol (MIRALAX  / GLYCOLAX ) packet Take 17 g by mouth 2 (two) times daily as needed (constiaption).   Yes [provider]  sacubitril -valsartan  (ENTRESTO ) 49-51 MG Take 1 tablet by mouth 2 (two) times daily. 08/23/23  Yes Nishan, Peter C, MD  Semaglutide, 2 MG/DOSE, (OZEMPIC, 2 MG/DOSE,) 8 MG/3ML SOPN Inject 2 mg into the skin every Sunday.   Yes [provider]  Tiotropium Bromide Monohydrate  (SPIRIVA  RESPIMAT) 1.25 MCG/ACT AERS Inhale 2 puffs into the lungs daily. Patient taking differently: Inhale 2 puffs into the lungs daily as needed (wheezing shortness of breath). 03/12/23  Yes Geronimo Amel, MD  pregabalin (LYRICA) 100 MG capsule Take 100 mg by mouth 2 (two) times daily. Patient not taking: Reported on 10/09/2023    [provider]     Vital Signs: BP 101/61 (BP Location: Left Arm)   Pulse 67   Temp 97.6 F (36.4 C) (Oral)   Resp 12   Ht 5' 9 (1.753 m)   Wt 269 lb 10 oz (122.3 kg)   SpO2 97%   BMI 39.82 kg/m   Physical Exam Vitals and nursing note reviewed.  Constitutional:      Appearance: Normal appearance.  Cardiovascular:     Rate and Rhythm: Normal rate.  Pulmonary:     Effort: Pulmonary effort is normal.  Abdominal:     Palpations: Abdomen is soft.     Comments: Mild  ttp surrounding drain site and extending into left side. No overlying abnormality. Dressed appropriately.   Skin:    General: Skin is warm and dry.  Neurological:     Mental Status: She is oriented to person, place, and time. Mental status is at baseline.     Imaging: CT GUIDED SOFT TISSUE FLUID DRAIN BY PERC CATH Result Date: 10/16/2023 INDICATION: 63 year old with history of sigmoid bowel perforation with peritonitis. Status post sigmoid colon resection and colostomy. Patient has postoperative fluid collections. Patient presents for CT-guided drain  placement. EXAM: CT-GUIDED PLACEMENT OF DRAINAGE CATHETER IN LEFT LOWER ABDOMINAL FLUID COLLECTION TECHNIQUE: Multidetector CT imaging of the abdomen and pelvis was performed following the standard protocol without IV contrast. RADIATION DOSE REDUCTION: This exam was performed according to the departmental dose-optimization program which includes automated exposure control, adjustment of the mA and/or kV according to patient size and/or use of iterative reconstruction technique. MEDICATIONS: Moderate sedation ANESTHESIA/SEDATION: Moderate (conscious) sedation was employed during this procedure. A total of Versed  1 mg and Fentanyl  75 mcg was administered intravenously by the radiology nurse. Total intra-service moderate Sedation Time: 32 minutes. The patient's level of consciousness and vital signs were monitored continuously by radiology nursing throughout the procedure under my direct supervision. COMPLICATIONS: None immediate. PROCEDURE: Informed written consent was obtained from the patient after a thorough discussion of the procedural risks, benefits and alternatives. All questions were addressed. Maximal Sterile Barrier Technique was utilized including caps, mask, sterile gowns, sterile gloves, sterile drape, hand hygiene and skin antiseptic. A timeout was performed prior to the initiation of the procedure. Patient was placed supine. CT images through the lower abdomen and pelvis were obtained. The fluid collection in the anterior left lower quadrant was identified and targeted. Left lower abdomen was prepped with chlorhexidine  and sterile field was created. Skin was anesthetized using 1% lidocaine . Small incision was made. Using CT guidance, an 18 gauge trocar needle was directed into the left lower quadrant fluid collection. Amber colored fluid was aspirated. Superstiff Amplatz wire was advanced into the collection. Tract was dilated to accommodate a 10 [place] multipurpose drain. Fluid was collected and sent  for culture. Drain was sutured to skin and attached to a suction bulb. Bandage was placed. FINDINGS: 53 French drain placed within the anterior left lower quadrant collection. Majority of the fluid was aspirated. Amber colored fluid was aspirated from the drain. IMPRESSION: CT-guided placement of drainage catheter in the anterior left lower abdominal fluid collection. Electronically Signed   By: Juliene Balder M.D.   On: 10/16/2023 20:17   CT ABDOMEN PELVIS W CONTRAST Result Date: 10/15/2023 EXAM: CT ABDOMEN AND PELVIS WITH CONTRAST 10/15/2023 09:38:00 PM TECHNIQUE: CT of the abdomen and pelvis was performed with the administration of intravenous contrast, 75mL (iohexol  (OMNIPAQUE ) 350 MG/ML injection 75 mL IOHEXOL  350 MG/ML SOLN). Multiplanar reformatted images are provided for review. Automated exposure control, iterative reconstruction, and/or weight-based adjustment of the mA/kV was utilized to reduce the radiation dose to as low as reasonably achievable. COMPARISON: Comparison is made to October 07, 2023. CLINICAL HISTORY: Abdominal pain, post-operative, with anticipation of post-operative abscess. FINDINGS: LOWER CHEST: Small bilateral pleural effusions are present. Trace pericardial effusion. Cardiac size within normal limits. LIVER: The liver is unremarkable. Cholelithiasis is present without superimposed pericholecystic inflammatory change. No intra or extrahepatic biliary ductal dilation. GALLBLADDER AND BILE DUCTS: Cholelithiasis is present without superimposed pericholecystic inflammatory change. No biliary ductal dilatation. SPLEEN: No acute abnormality. PANCREAS: No acute abnormality. ADRENAL GLANDS: No acute abnormality. KIDNEYS,  URETERS AND BLADDER: No stones in the kidneys or ureters. No hydronephrosis. No perinephric or periureteral stranding. Urinary bladder contains gas, which is nonspecific and may relate to recent catheterization, though aggressive infection could appear similarly. GI AND  BOWEL: Interval changes of sigmoid colectomy, left midabdominal descending colostomy, and Hartmann patch formation are identified. The stomach, small bowel, and colon are otherwise unremarkable, and there is no evidence of obstruction. Punctate free intraperitoneal gas is noted within the omentum and right subdiaphragmatic region. Mesenteric edema and peritoneal enhancement are nonspecific and may represent postsurgical change and/or peritoneal inflammation. Multiple loculated rim-enhancing intraperitoneal fluid collections are identified, measuring 2.9 x 2.7 cm at image 49, series 3, and 2.2 x 3.8 cm at image 57, series 3, with growth within the deep small bowel mesentery. Within the pelvis, 3 separate fluid collections were identified, all seen on image 72, series 3, measuring 4.5 x 7.4 cm, 3.4 x 4.5 cm, and 4.3 x 7.5 cm. The appendix is not clearly identified and may be absent. PERITONEUM AND RETROPERITONEUM: There are multiple loculated rim-enhancing intraperitoneal fluid collections and punctate free intraperitoneal gas. Mesenteric edema and peritoneal enhancement are present. VASCULATURE: Aorta is normal in caliber. LYMPH NODES: No lymphadenopathy. REPRODUCTIVE ORGANS: No acute abnormality. BONES AND SOFT TISSUES: Osseous structures are age-appropriate. No acute bone abnormality. No lytic or blastic bone lesion. Superficial dehiscence of the midline laparotomy incision is noted. There is moderate subcutaneous edema within the flanks bilaterally. IMPRESSION: 1. Multiple loculated rim-enhancing intraperitoneal fluid collections, concerning for post-operative abscesses. 2. Punctate free intraperitoneal gas, mesenteric edema, and peritoneal enhancement, possibly representing postsurgical change and/or peritoneal inflammation. 3. Anasarca with body wall edema, Small bilateral pleural effusions and trace pericardial effusion. 4. Gas within the bladder lumen, nonspecific. Correlation with urinalysis may be helpful  for further management. Electronically signed by: Dorethia Molt MD 10/15/2023 10:51 PM EDT RP Workstation: [id number]    Labs:  CBC: Recent Labs    10/11/23 1015 10/11/23 1142 10/15/23 1026 10/17/23 0610  WBC 11.5* 12.0* 13.7* 14.0*  HGB 10.7* 11.2* 9.7* 8.8*  HCT 33.6* 35.4* 31.4* 27.9*  PLT 309 317 304 335    COAGS: Recent Labs    10/16/23 1149  INR 1.2    BMP: Recent Labs    10/12/23 0345 10/13/23 0617 10/14/23 0500 10/15/23 1026  NA 136 139 137 136  K 4.7 5.2* 5.0 4.2  CL 104 108 104 105  CO2 22 24 23 25   GLUCOSE 114* 88 112* 127*  BUN 37* 32* 26* 23  CALCIUM  7.8* 7.9* 8.1* 8.1*  CREATININE 1.22* 0.99 0.96 0.95  GFRNONAA 50* >60 >60 >60    LIVER FUNCTION TESTS: Recent Labs    10/09/23 0506 10/10/23 0445 10/11/23 1330 10/12/23 0345 10/15/23 1026  BILITOT 0.8 0.8 0.4  --  0.6  AST 12* 14* 18  --  15  ALT 9 9 11   --  10  ALKPHOS 49 62 63  --  85  PROT 5.3* 6.1* 6.0*  --  6.3*  ALBUMIN  1.9* 2.1* 2.0* 1.6* 1.7*    Assessment and Plan:  Day 1 post LLQ drain placement for abdominal abscess - pt doing well, some mild soreness at drain insertion site. No overlying abnormality - 30ml output for last 24h  Drain Location: LLQ Size: Fr size: 10 Fr Date of placement: 10/16/23  Currently to: Drain collection device: suction bulb 24 hour output:  Output by Drain (mL) 10/15/23 0701 - 10/15/23 1900 10/15/23 1901 - 10/16/23 0700 10/16/23 0701 -  10/16/23 1900 10/16/23 1901 - 10/17/23 0700 10/17/23 0701 - 10/17/23 1419  Closed System Drain Left;Anterior Abdomen Bulb (JP) 10 Fr.   30    Negative Pressure Wound Therapy Abdomen Medial 25  25      Interval imaging/drain manipulation:  N/a  Current examination: Flushes/aspirates easily.  Insertion site unremarkable. Suture and stat lock in place. Dressed appropriately.   Plan: Continue TID flushes with 5 cc NS. Record output Q shift. Dressing changes QD or PRN if soiled.  Call IR APP or on call IR MD  if difficulty flushing or sudden change in drain output.  Repeat imaging/possible drain injection once output < 10 mL/QD (excluding flush material). Consideration for drain removal if output is < 10 mL/QD (excluding flush material), pending discussion with the providing surgical service.  Discharge planning: Please contact IR APP or on call IR MD prior to patient d/c to ensure appropriate follow up plans are in place. Typically patient will follow up with IR clinic 10-14 days post d/c for repeat imaging/possible drain injection. IR scheduler will contact patient with date/time of appointment. Patient will need to flush drain QD with 5 cc NS, record output QD, dressing changes every 2-3 days or earlier if soiled.   IR will continue to follow - please call with questions or concerns.     Electronically Signed: Kimble VEAR Clas, PA-C 10/17/2023, 1:43 PM   I spent a total of 15 Minutes at the the patient's bedside AND on the patient's hospital floor or unit, greater than 50% of which was counseling/coordinating care for LLQ abdominal abscess drain follow up

## 2023-10-17 NOTE — Hospital Course (Addendum)
 Brief Narrative:   63 year old female with past medical history significant for ANCA vasculitis, chronic immunosuppression, type 2 diabetes, hypertension, hyperlipidemia, ILD and previous sigmoid colon perforation who presented to the ER via EMS with left-sided abdominal pain that began 7 days prior to admission.  On arrival patient was hypothermic and admitted with fever, tachypnea, tachycardic and hypotensive.  Workup revealed mild sigmoid colitis with perforation.  Patient admitted to ICU for septic shock.  She underwent exploratory laparotomy on 9/28, sigmoid bowel perforation with feculent peritonitis noted.  She was transferred to [facility] on 10/4.  She remains on TPN.  Has been requiring frequent pain medications and is noted to have softer blood pressures but blood pressure seems to have improved in the past several days, remains on midodrine.  Slowly she has regained her bowel function.  CT 10/6 shows multiple abscess in the abdomen, eventually drain was placed by IR on 10/7. Patient has now been weaned off TPN and doing well.  Planning for CIR today.  General surgery to continue to follow the patient.  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  Has wound VAC to abdominal wound.  Tolerating p.o., off TPN -Repeat CT abdomen/pelvis 10/6 showed multiple intraperitoneal abscess requiring drain placement by IR on 10/7. Contact IR prior to dc for follow up care arrangement - Followed by general surgery, continue anti-infectives as previously planned.  Managed by general surgery, will request them to continue to follow the patient -Will continue to wean down midodrine as able.    ANCA associated vasculitis/ILD  on methotrexate /CellCept  at baseline, currently on hold due to infection  continue incruse   Diabetes mellitus type 2  Hemoglobin A1c is 4.6 on 10/07/2023.  Blood sugars  reading are in acceptable range.   NICM   On Coreg , Entresto  PTA.  Home meds can slowly be resumed depending on blood pressure.  Attempt to wean off midodrine   Borderline hyperkalemia  Will recheck labs intermittently.   Anxiety  Continue alprazolam , bupropion , fluoxetine   Physical deconditioning: PT OT following.  Admission to CIR   DVT prophylaxis: SQ Heparin     Code Status: Full Code Family Communication:   Status is: Inpatient Remains inpatient appropriate because: discharge from the hospital PT Follow up Recs: Acute Inpatient Rehab (3hours/Day)10/17/2023 1646  Subjective: Feeling well no complaints.  Tolerating p.o. better   Examination:  General exam: Appears calm and comfortable  Respiratory system: Clear to auscultation. Respiratory effort normal. Cardiovascular system: S1 & S2 heard, RRR. No JVD, murmurs, rubs, gallops or clicks. No pedal edema. Gastrointestinal system: Abdomen is nondistended, soft and nontender. No organomegaly or masses felt. Normal bowel sounds heard. Central nervous system: Alert and oriented. No focal neurological deficits. Extremities: Symmetric 5 x 5 power. Skin: Surgical wound in the abdomen noted, left lower quadrant drain with sanguinous fluid.  Ostomy in place Psychiatry: Judgement and insight appear normal. Mood & affect appropriate. Right upper extremity PICC line in place

## 2023-10-17 NOTE — Progress Notes (Signed)
   Inpatient Rehab Admissions Coordinator :  Per therapy change in recommendations, patient was screened for CIR candidacy by Ottie Glazier RN MSN.  At this time patient appears to be a potential candidate for CIR. I will place a rehab consult per protocol for full assessment. Please call me with any questions.  Ottie Glazier RN MSN Admissions Coordinator [phone]

## 2023-10-17 NOTE — Progress Notes (Signed)
 PHARMACY - TOTAL PARENTERAL NUTRITION CONSULT NOTE   Indication: Prolonged ileus  Patient Measurements: Height: 5' 9 (175.3 cm) Weight: 121.6 kg (268 lb 1.3 oz) IBW/kg (Calculated) : 66.2 TPN AdjBW (KG): 79.1 Body mass index is 39.59 kg/m.   Assessment:  60 YOF admitted w/ HPI significant for left sided abdominal pain that began 7 days PTA with associated decreased oral intake, found to have perforated sigmoid colon who is s/p emergent exlap w/ resection / Hartmann's colostomy. She requires TPN for anticipated post-op ileus and prolonged inability to obtain nutrition via oral route.   Noted that TPN was unable to infuse on 10/7 due to access issues. IV team has assessed this morning and line is now functioning with good blood return.    Glucose / Insulin : HgbA1c 4.6 / BG 101-113, 14 units regular insulin  in TPN, no addl SSI, Q8H CBG checks  Electrolytes:  10/6 labs - K: 4.2 (none in TPN), CoCa 9.9, Mg 1.9, others wnl Renal: Scr 0.95, BUN 23 Hepatic: AST/ ALT/alk Phos/ Tbili wnl, alb 1.7, TG 86 (10/1) Intake / Output; MIVF: UOP 0.9 ml/kg/hr, drains 55ml, colostomy 50 ml but charted LBM 10/6   GI Imaging: 9/28 CT abd shows sigmoid colitis w/ perf, extraluminal gas + stool extending into small bowel mesentery, extensive inflammation changes throughout lower abd + pelvic mesentery + peritoneal thickening GI Surgeries / Procedures:  10/6 CT abdomen: fluid collections concerning for post-op abscess  9/28 Exlap w/ resection and Hartmann's colostomy  Central access: 9/28 TPN start date: 9/29  Nutritional Goals: Goal TPN rate is 75 mL/hr (provides 100 g of protein and 2016 kcals per day)  RD Assessment: Estimated Needs Total Energy Estimated Needs: 1850-2050 Total Protein Estimated Needs: 95-110g Total Fluid Estimated Needs: >/=1.8L  Current Nutrition:  TPN 9/29 >> 10/3 CLD: Boost x2 given to patient 10/4 FLD: 1 Boost, some pudding 10/5 FLD: 1 vanilla ice cream, working on  [employer]. Ostomy leakage overnight >replaced, abdominal irritation/cramping, especially after dairy products but was never lactose-intolerant before; Small amount of liquid in ostomy  10/5 Soft diet - calorie count started on 10/5, remains with low PO intake per discussion with RD  10/7 NPO for IR placement of drains   10/8 diet soft with calorie count   Plan:  Continue TPN at goal 75 mL/hr at 1800, which provides 100% estimated nutritional needs Electrolytes in TPN: Na 100 mEq/L, K 0 mEq/L, Ca 3 mEq/L, Mg 10 mEq/L, Phos 10 mmol/L. Cl:Ac 1:1 Continue standard MVI and trace elements to TPN outside of TPN.  Reduce to 10 units regular insulin  in TPN. Patient NPO yesterday which likely caused readings of around 100 between 0700-1800. However, patients CBG have been around 120-140 even with diet and TPN on 10/5-10/6. No hx of diabetes.  Continue q8hr BG check Monitor TPN labs on Mon/Thurs, and prn F/u patient toleration of soft diet, calorie count continued for 10/8.   Thank you for allowing pharmacy to be a part of this patient's care.  Powell Blush, PharmD, BCCCP  Clinical Pharmacist

## 2023-10-18 DIAGNOSIS — R5381 Other malaise: Secondary | ICD-10-CM

## 2023-10-18 DIAGNOSIS — K631 Perforation of intestine (nontraumatic): Secondary | ICD-10-CM | POA: Diagnosis not present

## 2023-10-18 LAB — CBC
HCT: 28.2 % — ABNORMAL LOW (ref 36.0–46.0)
Hemoglobin: 8.9 g/dL — ABNORMAL LOW (ref 12.0–15.0)
MCH: 28.3 pg (ref 26.0–34.0)
MCHC: 31.6 g/dL (ref 30.0–36.0)
MCV: 89.8 fL (ref 80.0–100.0)
Platelets: 360 K/uL (ref 150–400)
RBC: 3.14 MIL/uL — ABNORMAL LOW (ref 3.87–5.11)
RDW: 13.7 % (ref 11.5–15.5)
WBC: 11.6 K/uL — ABNORMAL HIGH (ref 4.0–10.5)
nRBC: 0 % (ref 0.0–0.2)

## 2023-10-18 LAB — COMPREHENSIVE METABOLIC PANEL WITH GFR
ALT: 13 U/L (ref 0–44)
AST: 16 U/L (ref 15–41)
Albumin: 1.7 g/dL — ABNORMAL LOW (ref 3.5–5.0)
Alkaline Phosphatase: 118 U/L (ref 38–126)
Anion gap: 9 (ref 5–15)
BUN: 23 mg/dL (ref 8–23)
CO2: 24 mmol/L (ref 22–32)
Calcium: 8.1 mg/dL — ABNORMAL LOW (ref 8.9–10.3)
Chloride: 103 mmol/L (ref 98–111)
Creatinine, Ser: 1.1 mg/dL — ABNORMAL HIGH (ref 0.44–1.00)
GFR, Estimated: 57 mL/min — ABNORMAL LOW (ref >60)
Glucose, Bld: 115 mg/dL — ABNORMAL HIGH (ref 70–99)
Potassium: 4 mmol/L (ref 3.5–5.1)
Sodium: 136 mmol/L (ref 135–145)
Total Bilirubin: 0.5 mg/dL (ref 0.0–1.2)
Total Protein: 5.8 g/dL — ABNORMAL LOW (ref 6.5–8.1)

## 2023-10-18 LAB — GLUCOSE, CAPILLARY
Glucose-Capillary: 103 mg/dL — ABNORMAL HIGH (ref 70–99)
Glucose-Capillary: 118 mg/dL — ABNORMAL HIGH (ref 70–99)
Glucose-Capillary: 122 mg/dL — ABNORMAL HIGH (ref 70–99)

## 2023-10-18 LAB — PHOSPHORUS: Phosphorus: 4.2 mg/dL (ref 2.5–4.6)

## 2023-10-18 LAB — MAGNESIUM: Magnesium: 2.1 mg/dL (ref 1.7–2.4)

## 2023-10-18 MED ORDER — FENTANYL CITRATE (PF) 50 MCG/ML IJ SOSY
50.0000 ug | PREFILLED_SYRINGE | INTRAMUSCULAR | Status: DC | PRN
  Administered 2023-10-19: 50 ug via INTRAVENOUS
  Filled 2023-10-18: qty 1

## 2023-10-18 MED ORDER — BOOST PLUS PO LIQD
237.0000 mL | Freq: Two times a day (BID) | ORAL | Status: DC
  Administered 2023-10-18 – 2023-10-19 (×3): 237 mL via ORAL
  Filled 2023-10-18 (×7): qty 237

## 2023-10-18 MED ORDER — INSULIN ASPART 100 UNIT/ML IJ SOLN: 0.0000 [IU] | Freq: Three times a day (TID) | INTRAMUSCULAR | Status: DC

## 2023-10-18 MED ORDER — TRAVASOL 10 % IV SOLN
INTRAVENOUS | Status: AC
  Filled 2023-10-18: qty 466.2

## 2023-10-18 NOTE — Progress Notes (Signed)
 11 Days Post-Op   Subjective/Chief Complaint: No acute events overnight.  Denies any leaking from her ostomy appliance.  Worked with physical and Occupational Therapy yesterday who are recommending CIR.  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  She is considering her options.  Tolerating oral intake with early satiety, says she likes chocolate boost.   Objective: Vital signs in last 24 hours: Temp:  [97.7 F (36.5 C)-98.4 F (36.9 C)] 97.7 F (36.5 C) (10/09 0741) Pulse Rate:  [64-74] 74 (10/09 0741) Resp:  [14-18] 14 (10/09 0741) BP: (95-134)/(54-73) 95/73 (10/09 0741) SpO2:  [95 %-97 %] 97 % (10/09 0741) Weight:  [122.3 kg] 122.3 kg (10/09 0500) Last BM Date : 10/17/23  Intake/Output from previous day: 10/08 0701 - 10/09 0700 In: 2178.5 [P.O.:360; I.V.:1803.5] Out: 3155 [Urine:2700; Drains:80; Stool:375] Intake/Output this shift: No intake/output data recorded.  GEN: alert NAD Pulm: normal effort CV: RRR Abd: soft, appropriately tender, NPWT holding suction, ostomy in place with semisolid brown stool in pouch (375 mL documented), LLQ drain with amber/serous fluid (80 mL)   Lab Results:  Recent Labs    10/17/23 0610 10/18/23 0320  WBC 14.0* 11.6*  HGB 8.8* 8.9*  HCT 27.9* 28.2*  PLT 335 360    BMET Recent Labs    10/15/23 1026 10/18/23 0320  NA 136 136  K 4.2 4.0  CL 105 103  CO2 25 24  GLUCOSE 127* 115*  BUN 23 23  CREATININE 0.95 1.10*  CALCIUM  8.1* 8.1*   PT/INR Recent Labs    10/16/23 1149  LABPROT 15.5*  INR 1.2   ABG No results for input(s): PHART, HCO3 in the last 72 hours.  Invalid input(s): PCO2, PO2  Studies/Results: CT GUIDED SOFT TISSUE FLUID DRAIN BY PERC CATH Result Date: 10/16/2023 INDICATION: 63 year old with history of sigmoid bowel perforation with peritonitis. Status post sigmoid colon resection and colostomy. Patient has  postoperative fluid collections. Patient presents for CT-guided drain placement. EXAM: CT-GUIDED PLACEMENT OF DRAINAGE CATHETER IN LEFT LOWER ABDOMINAL FLUID COLLECTION TECHNIQUE: Multidetector CT imaging of the abdomen and pelvis was performed following the standard protocol without IV contrast. RADIATION DOSE REDUCTION: This exam was performed according to the departmental dose-optimization program which includes automated exposure control, adjustment of the mA and/or kV according to patient size and/or use of iterative reconstruction technique. MEDICATIONS: Moderate sedation ANESTHESIA/SEDATION: Moderate (conscious) sedation was employed during this procedure. A total of Versed  1 mg and Fentanyl  75 mcg was administered intravenously by the radiology nurse. Total intra-service moderate Sedation Time: 32 minutes. The patient's level of consciousness and vital signs were monitored continuously by radiology nursing throughout the procedure under my direct supervision. COMPLICATIONS: None immediate. PROCEDURE: Informed written consent was obtained from the patient after a thorough discussion of the procedural risks, benefits and alternatives. All questions were addressed. Maximal Sterile Barrier Technique was utilized including caps, mask, sterile gowns, sterile gloves, sterile drape, hand hygiene and skin antiseptic. A timeout was performed prior to the initiation of the procedure. Patient was placed supine. CT images through the lower abdomen and pelvis were obtained. The fluid collection in the anterior left lower quadrant was identified and targeted. Left lower abdomen was prepped with chlorhexidine  and sterile field was created. Skin was anesthetized using 1% lidocaine . Small incision was made. Using CT guidance, an 18 gauge trocar needle was directed into the left lower quadrant fluid collection. Amber colored fluid  was aspirated. Superstiff Amplatz wire was advanced into the collection. Tract was dilated to  accommodate a 10 [place] multipurpose drain. Fluid was collected and sent for culture. Drain was sutured to skin and attached to a suction bulb. Bandage was placed. FINDINGS: 51 French drain placed within the anterior left lower quadrant collection. Majority of the fluid was aspirated. Amber colored fluid was aspirated from the drain. IMPRESSION: CT-guided placement of drainage catheter in the anterior left lower abdominal fluid collection. Electronically Signed   By: Juliene Balder M.D.   On: 10/16/2023 20:17    Anti-infectives: Anti-infectives (From admission, onward)    Start     Dose/Rate Route Frequency Ordered Stop   10/07/23 2200  piperacillin -tazobactam (ZOSYN ) IVPB 3.375 g       Placed in Followed by Linked Group   3.375 g 12.5 mL/hr over 240 Minutes Intravenous Every 8 hours 10/07/23 1351     10/07/23 1732  vancomycin variable dose per unstable renal function (pharmacist dosing)  Status:  Discontinued         Does not apply See admin instructions 10/07/23 1732 10/08/23 0825   10/07/23 1545  micafungin (MYCAMINE) 150 mg in sodium chloride  0.9 % 100 mL IVPB        150 mg 107.5 mL/hr over 1 Hours Intravenous Every 24 hours 10/07/23 1530     10/07/23 1530  metroNIDAZOLE  (FLAGYL ) IVPB 500 mg  Status:  Discontinued        500 mg 100 mL/hr over 60 Minutes Intravenous Every 12 hours 10/07/23 1518 10/07/23 1532   10/07/23 1530  vancomycin (VANCOREADY) IVPB 2000 mg/400 mL        2,000 mg 200 mL/hr over 120 Minutes Intravenous  Once 10/07/23 1526 10/07/23 2033   10/07/23 1400  metroNIDAZOLE  (FLAGYL ) IVPB 500 mg        500 mg 100 mL/hr over 60 Minutes Intravenous  Once 10/07/23 1346 10/07/23 1507   10/07/23 1400  piperacillin -tazobactam (ZOSYN ) IVPB 3.375 g       Placed in Followed by Linked Group   3.375 g 100 mL/hr over 30 Minutes Intravenous  Once 10/07/23 1351 10/07/23 1502       Assessment/Plan: s/p Procedure(s): LAPAROTOMY, EXPLORATORY (N/A) EXCISION, SMALL INTESTINE  (N/A) Advance diet. Allow soft food today OOB/ PT Sigmoid bowel perforation with feculent peritonitis, likely due to stercoral colitis vs diverticulitis POD#11 s/p  LAPAROTOMY, EXPLORATORY, Sigmoid colon resection, Hartman's colostomy 9.28 Dr. Rubin - soft diet, started weaning TNA 10/5, calorie count; twice daily chocolate boost ordered;  - CT scan 10/6 shows multiple pelvis abscesses, s/p IR perc drain 10/7; Cx NGTD, follow. - Vac change Mon/Thurs; appreciate WOC RN assistance with pouch leakage/close proximity to midline wound. May ultimately need to go home on wet-to-dry dressing changes but would like to try to continue [facility] [place] for now.   FEN: Soft, 1/2 TPN, calorie count ID: Zosyn , Micafungin >>Cx from pelvic fluid collections NGTD (48h), tentative plan to stop abx after a total of 14 days (10/12) VTE: SCD's, SQH Foley: purewick; D/C and encourage bedside commode  Dispo: progressive care; CIR versus home once eating more  ANCA associated vasculitis/ILD - on methotrexate /cellcept  at baseline, held now PMH ulcerative keratitis  DM2 NICM Anxiety PMH AKI/ATN 2021 after admission for diverticulitis, managed non-op with IR drain. Nephrologist: [name]  LOS: 11 days    [name] [name] 10/18/2023

## 2023-10-18 NOTE — Consult Note (Signed)
 Physical Medicine and Rehabilitation Consult Reason for Consult: Evaluate appropriateness for Inpatient Rehab Referring Physician: Dr. Caleen    HPI: [name] is a 63 y.o. 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), Diverticulitis of large intestine with perforation (09/23/2012), Dyspnea (05/11/2014), GERD (gastroesophageal reflux disease), Hemoptysis (06/23/2013), History of endometriosis (1997), tobacco use, presenting hazards to health (09/23/2012), Hyperglycemia (07/24/2013), Hypertension, Hypokalemia (07/20/2013), ILD (interstitial lung disease) (HCC) (01/22/2014), Leukocytosis (05/11/2014), MPA (microscopic polyangiitis) (HCC) (2015), Numbness, Obesity (BMI 30-39.9) (09/23/2012), Paresthesia (12/26/2016), Perforation of sigmoid colon - stercoral (09/27/2012), Physical deconditioning (04/30/2015), Pulmonary alveolar hemorrhage (09/03/2013), Respiratory failure with hypoxia (HCC) (06/23/2013), and Right leg swelling (12/26/2016). . They were admitted to [facility] on 10/07/2023 for sepsis, CT abdomen and pelvis showed sigmoid bowel perforation with peritonitis secondary to diverticulitis.  She was started on IV Zosyn , vancomycin, and antifungal given chronic immunosuppression from ANCA vasculitis on CellCept , and taken urgently to the OR for ex lap and colostomy placement by Dr. Rubin.  Follow-up CT scan 10-6 showed multiple pelvic abscesses, IR placed percutaneous drain 10-7.  Cultures negative thus far.  She remains on IV Zosyn  and  micafungin, with a stop date of 14 days on 10-12.  She is currently weaning TNA and tolerating a soft diet.  Hospitalization has been otherwise complicated by complex postsurgical wound with wound VAC changed Monday-Thursday, urinary incontinence with pure wick, type 2 diabetes, ANCA vasculitis/ILD with immunosuppressive's health, NICM, hyperkalemia, poorly controlled pain, hypotension on midodrine, and anemia.  PM&R was consulted to evaluate appropriateness for IPR admission.    Per record, at baseline she lives in a private residence, planning to go to her sisters home on discharge with 24/7 help available by her sister.  This is a double wide home with a level entry.  Prior to admission, she was independent of mobility and ADLs, and taking care of her mother with dementia at her sister's home while her husband is away (he is a truck driver gone 25 days a month).  Currently, she is min to max assist +2 for transfers, walking 75 feet with a bariatric rollator at a min assist +2 level, set up assist for upper body ADLs and max assist for lower body ADLs.  She is limited by fatigue, decreased strength when standing from a low height, and poor safety awareness/cueing.  Review of Systems  Constitutional:  Negative for chills and fever.  Cardiovascular:  Negative for chest pain.  Gastrointestinal:  Positive for abdominal pain and nausea.  Genitourinary:  Positive for urgency.  Musculoskeletal:  Negative for joint pain and myalgias.  Neurological:  Positive for dizziness and weakness. Negative for sensory change and headaches.  Psychiatric/Behavioral:  Positive for depression.    Past Medical History:  Diagnosis Date   Acute blood loss anemia 06/23/2013   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HCC)    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HCC) 06/24/2013   Anxiety associated with depression 09/23/2012   At high risk for falls 09/23/2012   She has had a fractured ankle and tendon tear with falls over the last couple years.    Cardiomyopathy (HCC)    Chronic fatigue 05/11/2014   Chronic right-sided low back pain  with right-sided sciatica 12/26/2016   Colitis    Constipation, chronic 09/27/2012   Diabetes mellitus without complication (HCC)    Diffuse pulmonary alveolar hemorrhage 06/23/2013   Diverticulitis of large intestine with perforation 09/23/2012   Dyspnea 05/11/2014   GERD (gastroesophageal reflux disease)    Hemoptysis 06/23/2013   History of endometriosis 1997   Hx of tobacco use, presenting hazards to health 09/23/2012   Hyperglycemia 07/24/2013   Hypertension    Hypokalemia 07/20/2013   ILD (interstitial lung disease) (HCC) 01/22/2014   Leukocytosis 05/11/2014   MPA (microscopic polyangiitis) (HCC) 2015   Numbness    Obesity (BMI 30-39.9) 09/23/2012   Paresthesia 12/26/2016   Perforation of sigmoid colon - stercoral 09/27/2012   Physical deconditioning 04/30/2015   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   Respiratory failure with hypoxia (HCC) 06/23/2013   Right leg swelling 12/26/2016   Past Surgical History:  Procedure Laterality Date   APPENDECTOMY     BLADDER REPAIR     BOWEL RESECTION N/A 10/07/2023   Procedure: EXCISION, SMALL INTESTINE;  Surgeon: Rubin Calamity, MD;  Location: [facility] OR;  Service: General;  Laterality: N/A;   DILATATION & CURETTAGE/HYSTEROSCOPY WITH MYOSURE N/A 06/03/2019   Procedure: DILATATION & CURETTAGE/HYSTEROSCOPY WITH MYOSURE;  Surgeon: Curlene Agent, MD;  Location: [facility] OR;  Service: Gynecology;  Laterality: N/A;   IR CATHETER TUBE CHANGE  06/24/2019   IR FLUORO GUIDE CV LINE RIGHT  06/16/2019   IR RADIOLOGIST EVAL & MGMT  07/29/2019   IR US  GUIDE VASC ACCESS RIGHT  06/16/2019   LAPAROSCOPY  1997   dx of endometriosis   LAPAROSCOPY  04/28/2000    Laparoscopy with lysis of adhesions, hysteroscopy, D&C.   LAPAROTOMY N/A 10/07/2023   Procedure: LAPAROTOMY, EXPLORATORY;  Surgeon: Rubin Calamity, MD;  Location: [facility] OR;  Service: General;  Laterality: N/A;   TONSILLECTOMY      Family History  Problem Relation Age of Onset   Diabetes Mother        AODM   Diabetes Father        AODM   Hypertension Father    Obesity Father    Breast cancer Paternal Grandmother    Birth defects Cousin    CAD Other        [facility]   Social History:  reports that she quit smoking about 22 years ago. Her smoking use included cigarettes. She started smoking about 49 years ago. She has a 27 pack-year smoking history. She has never used smokeless tobacco. She reports that she does not drink alcohol and does not use drugs. Allergies:  Allergies  Allergen Reactions   Mucinex [Guaifenesin Er] Nausea And Vomiting   Sulfa Antibiotics Nausea And Vomiting   Glucophage [Metformin] Nausea Only   Medications Prior to Admission  Medication Sig Dispense Refill   ALPRAZolam  (XANAX ) 1 MG tablet Take 1 tablet (1 mg total) by mouth 3 (three) times daily as needed for anxiety. (Patient taking differently: Take 0.5-1.5 mg by mouth See admin instructions. Take 1-1.5 tablets (1-1.5mg ) by mouth every night at bedtime and take 0.5-1 tablets (0.5-1mg ) twice daily as needed for anxiety.) 5 tablet 0   buPROPion  (WELLBUTRIN  XL) 150 MG 24 hr tablet Take 450 mg by mouth daily.     carvedilol  (COREG ) 12.5 MG tablet Take 1 tablet (12.5 mg total) by mouth 2 (two) times daily. 180 tablet 3   ergocalciferol (VITAMIN D2) 1.25 MG (50000 UT) capsule Take 50,000 Units by mouth every Sunday.     FLUoxetine (PROZAC)  40 MG capsule Take 40 mg by mouth daily.     folic acid  (FOLVITE ) 1 MG tablet Take 1 mg by mouth daily.      methotrexate  50 MG/2ML injection Inject 15 mg into the vein every Sunday.     mycophenolate  (CELLCEPT ) 500 MG tablet Take 3 tablets (1,500 mg total) by mouth 2 (two) times daily.     nitroGLYCERIN  (NITROSTAT ) 0.4 MG SL tablet Place 1 tablet (0.4 mg total) under the tongue every 5 (five) minutes as needed for chest pain. 90 tablet 3   polyethylene glycol (MIRALAX  / GLYCOLAX ) packet Take 17 g by mouth 2  (two) times daily as needed (constiaption).     sacubitril -valsartan  (ENTRESTO ) 49-51 MG Take 1 tablet by mouth 2 (two) times daily. 180 tablet 3   Semaglutide, 2 MG/DOSE, (OZEMPIC, 2 MG/DOSE,) 8 MG/3ML SOPN Inject 2 mg into the skin every Sunday.     Tiotropium Bromide Monohydrate  (SPIRIVA  RESPIMAT) 1.25 MCG/ACT AERS Inhale 2 puffs into the lungs daily. (Patient taking differently: Inhale 2 puffs into the lungs daily as needed (wheezing shortness of breath).) 12 g 3   pregabalin (LYRICA) 100 MG capsule Take 100 mg by mouth 2 (two) times daily. (Patient not taking: Reported on 10/09/2023)      Home: Home Living Family/patient expects to be discharged to:: Private residence Living Arrangements: Other (Comment) (going to stay at her sisters house) Available Help at Discharge: Family, Available 24 hours/day (sister) Type of Home: Mobile home Home Access: Level entry Home Layout: One level Bathroom Shower/Tub: Engineer, manufacturing systems: Standard Bathroom Accessibility: Yes Home Equipment: Tub bench, Grab bars - tub/shower, Hand held shower head  Functional History: Prior Function Prior Level of Function : Independent/Modified Independent Mobility Comments: independent, driving ADLs Comments: independent taking care of her mother with dementia, does not cook Functional Status:  Mobility: Bed Mobility Overal bed mobility: Needs Assistance Bed Mobility: Supine to Sit Supine to sit: Mod assist, HOB elevated, Used rails Sit to supine: +2 for safety/equipment, HOB elevated, Used rails, Contact guard assist General bed mobility comments: pt educated on abdominal protection with log roll technique but pt declines voicing Let me just show you how I do it, need up to mod assist for trunk support and therapist encouraging partial roll to protect abd. Transfers Overall transfer level: Needs assistance Equipment used: Rolling walker (2 wheels) (bariatric) Transfers: Sit to/from Stand Sit to  Stand: From elevated surface, Min assist, +2 safety/equipment, Mod assist Bed to/from chair/wheelchair/BSC transfer type:: Step pivot Step pivot transfers: Max assist, +2 physical assistance, +2 safety/equipment General transfer comment: from EOB with min assist, from low toilet seat with wall rail and +2 modA to RW after x 5 unsuccessful attempts with +1 min to modA. RW>chair with cues for safe UE placement. Ambulation/Gait Ambulation/Gait assistance: Contact guard assist, Min assist, +2 safety/equipment Gait Distance (Feet): 75 Feet Assistive device: Rolling walker (2 wheels) (bariatric) Gait Pattern/deviations: Step-through pattern, Decreased step length - right, Decreased step length - left, Trunk flexed, Wide base of support General Gait Details: CGA to Min physical assist for RW management and steadying, esp when using rollator; ~14ft to bathroom with bari RW, then ~59ft with bari rollator. Second person for line assist and preparing to offer chair follow PRN for safety; vc for sequencing, posture and activity pacing. Pt reports bari RW feels more stable to her than rollator, case mgmt notified after session. Gait velocity: slowed Gait velocity interpretation: <1.31 ft/sec, indicative of household ambulator  ADL: ADL Overall ADL's : Needs assistance/impaired Grooming: Set up, Sitting Upper Body Dressing : Set up, Sitting Lower Body Dressing: Maximal assistance, Sit to/from stand Toilet Transfer: Minimal assistance, +2 for safety/equipment, Rolling walker (2 wheels), Maximal assistance, +2 for physical assistance, Regular Toilet, Ambulation, Grab bars Toilet Transfer Details (indicate cue type and reason): Min assist from bed height, max +2 from low toilet heavy reliance on GB Toileting- Clothing Manipulation and Hygiene: Supervision/safety, Sitting/lateral lean Functional mobility during ADLs: Minimal assistance, +2 for safety/equipment General ADL Comments: Decreased strength when  standing from low height, poor activity tolerance  Cognition: Cognition Orientation Level: Oriented X4 Cognition Arousal: Alert Behavior During Therapy: Anxious  Blood pressure 95/73, pulse 74, temperature 97.7 F (36.5 C), temperature source Oral, resp. rate 14, height 5' 9 (1.753 m), weight 122.3 kg, SpO2 97%. Physical Exam  PE: Constitution: Appropriate appearance for age. No apparent distress  +Obese Resp: No respiratory distress. No accessory muscle usage. on RA and CTAB Cardio: Well perfused appearance. No peripheral edema. Abdomen: +BS, hypoactive. Diffusely tender, no rebound or point tenderness. +Ostomy with moderate brown, liquid OP.  Psych: Appropriate mood and affect. +Depression; no SI/HI Neuro: AAOx4. No apparent cognitive deficits   Neurologic Exam:   DTRs: Reflexes were 2+ in bilateral achilles, patella, biceps, BR and triceps. Babinsky: flexor responses b/l.   Hoffmans: negative b/l Sensory exam: revealed normal sensation in all dermatomal regions in bilateral upper extremities and with reduced sensation to light touch in bilateral feet extending to the proximal ankle      Strength:                RUE: 4/5 SA, 5/5 EF, 5/5 EE, 5/5 WE, 5/5 FF, 5/5 FA                LUE:  4/5 SA, 5/5 EF, 5/5 EE, 5/5 WE, 5/5 FF, 5/5 FA                RLE: 3/5 HF, 4/5 KE, 4/5  DF, 4/5  EHL, 4/5  PF                 LLE:  3/5 HF, 4/5 KE, 5/5  DF, 5/5  EHL, 5/5  PF   Coordination: Fine motor coordination was normal.       Results for orders placed or performed during the hospital encounter of 10/07/23 (from the past 24 hours)  Glucose, capillary     Status: Abnormal   Collection Time: 10/17/23 10:30 AM  Result Value Ref Range   Glucose-Capillary 105 (H) 70 - 99 mg/dL  Glucose, capillary     Status: Abnormal   Collection Time: 10/18/23 12:26 AM  Result Value Ref Range   Glucose-Capillary 122 (H) 70 - 99 mg/dL  Comprehensive metabolic panel     Status: Abnormal   Collection Time:  10/18/23  3:20 AM  Result Value Ref Range   Sodium 136 135 - 145 mmol/L   Potassium 4.0 3.5 - 5.1 mmol/L   Chloride 103 98 - 111 mmol/L   CO2 24 22 - 32 mmol/L   Glucose, Bld 115 (H) 70 - 99 mg/dL   BUN 23 8 - 23 mg/dL   Creatinine, Ser 8.89 (H) 0.44 - 1.00 mg/dL   Calcium  8.1 (L) 8.9 - 10.3 mg/dL   Total Protein 5.8 (L) 6.5 - 8.1 g/dL   Albumin  1.7 (L) 3.5 - 5.0 g/dL   AST 16 15 - 41 U/L   ALT 13 0 -  44 U/L   Alkaline Phosphatase 118 38 - 126 U/L   Total Bilirubin 0.5 0.0 - 1.2 mg/dL   GFR, Estimated 57 (L) >60 mL/min   Anion gap 9 5 - 15  Magnesium      Status: None   Collection Time: 10/18/23  3:20 AM  Result Value Ref Range   Magnesium  2.1 1.7 - 2.4 mg/dL  Phosphorus     Status: None   Collection Time: 10/18/23  3:20 AM  Result Value Ref Range   Phosphorus 4.2 2.5 - 4.6 mg/dL  CBC     Status: Abnormal   Collection Time: 10/18/23  3:20 AM  Result Value Ref Range   WBC 11.6 (H) 4.0 - 10.5 K/uL   RBC 3.14 (L) 3.87 - 5.11 MIL/uL   Hemoglobin 8.9 (L) 12.0 - 15.0 g/dL   HCT 71.7 (L) 63.9 - 53.9 %   MCV 89.8 80.0 - 100.0 fL   MCH 28.3 26.0 - 34.0 pg   MCHC 31.6 30.0 - 36.0 g/dL   RDW 86.2 88.4 - 84.4 %   Platelets 360 150 - 400 K/uL   nRBC 0.0 0.0 - 0.2 %  Glucose, capillary     Status: Abnormal   Collection Time: 10/18/23  6:14 AM  Result Value Ref Range   Glucose-Capillary 103 (H) 70 - 99 mg/dL   CT GUIDED SOFT TISSUE FLUID DRAIN BY PERC CATH Result Date: 10/16/2023 INDICATION: 63 year old with history of sigmoid bowel perforation with peritonitis. Status post sigmoid colon resection and colostomy. Patient has postoperative fluid collections. Patient presents for CT-guided drain placement. EXAM: CT-GUIDED PLACEMENT OF DRAINAGE CATHETER IN LEFT LOWER ABDOMINAL FLUID COLLECTION TECHNIQUE: Multidetector CT imaging of the abdomen and pelvis was performed following the standard protocol without IV contrast. RADIATION DOSE REDUCTION: This exam was performed according to the  departmental dose-optimization program which includes automated exposure control, adjustment of the mA and/or kV according to patient size and/or use of iterative reconstruction technique. MEDICATIONS: Moderate sedation ANESTHESIA/SEDATION: Moderate (conscious) sedation was employed during this procedure. A total of Versed  1 mg and Fentanyl  75 mcg was administered intravenously by the radiology nurse. Total intra-service moderate Sedation Time: 32 minutes. The patient's level of consciousness and vital signs were monitored continuously by radiology nursing throughout the procedure under my direct supervision. COMPLICATIONS: None immediate. PROCEDURE: Informed written consent was obtained from the patient after a thorough discussion of the procedural risks, benefits and alternatives. All questions were addressed. Maximal Sterile Barrier Technique was utilized including caps, mask, sterile gowns, sterile gloves, sterile drape, hand hygiene and skin antiseptic. A timeout was performed prior to the initiation of the procedure. Patient was placed supine. CT images through the lower abdomen and pelvis were obtained. The fluid collection in the anterior left lower quadrant was identified and targeted. Left lower abdomen was prepped with chlorhexidine  and sterile field was created. Skin was anesthetized using 1% lidocaine . Small incision was made. Using CT guidance, an 18 gauge trocar needle was directed into the left lower quadrant fluid collection. Amber colored fluid was aspirated. Superstiff Amplatz wire was advanced into the collection. Tract was dilated to accommodate a 10 [place] multipurpose drain. Fluid was collected and sent for culture. Drain was sutured to skin and attached to a suction bulb. Bandage was placed. FINDINGS: 48 French drain placed within the anterior left lower quadrant collection. Majority of the fluid was aspirated. Amber colored fluid was aspirated from the drain. IMPRESSION: CT-guided placement  of drainage catheter in the anterior left lower  abdominal fluid collection. Electronically Signed   By: Juliene Balder M.D.   On: 10/16/2023 20:17    Assessment/Plan: Diagnosis: Debility due to bowel perforation s/p exlap and ostomy by Dr. Rubin 9/8 Does the need for close, 24 hr/day medical supervision in concert with the patient's rehab needs make it unreasonable for this patient to be served in a less intensive setting? Yes Co-Morbidities requiring supervision/potential complications:  Malnutrition on TNA +  soft diet, complex postsurgical wounds with wound VAC and JP drain, urinary incontinence with pure wick, type 2 diabetes, ANCA vasculitis/ILD with immunosuppressive's health, NICM, hyperkalemia, poorly controlled pain, hypotension on midodrine, RLE radiculopathy with weakness, and anemia Due to bladder management, bowel management, safety, skin/wound care, disease management, medication administration, pain management, and patient education, does the patient require 24 hr/day rehab nursing? Yes Does the patient require coordinated care of a physician, rehab nurse, therapy disciplines of PT, OT to address physical and functional deficits in the context of the above medical diagnosis(es)? Yes Addressing deficits in the following areas: balance, endurance, locomotion, strength, transferring, bowel/bladder control, bathing, dressing, grooming, and toileting Can the patient actively participate in an intensive therapy program of at least 3 hrs of therapy per day at least 5 days per week? Yes The potential for patient to make measurable gains while on inpatient rehab is good Anticipated functional outcomes upon discharge from inpatient rehab are supervision  with PT, supervision with OT Estimated rehab length of stay to reach the above functional goals is: 10-14 days Anticipated discharge destination: Home Overall Rehab/Functional Prognosis: good  POST ACUTE RECOMMENDATIONS: This patient's condition  is appropriate for continued rehabilitative care in the following setting: CIR Patient has agreed to participate in recommended program. Potentially - she prefers DC home with H/H if at all feasible but will consider IRF if needed; will not consider SNF Note that insurance prior authorization may be required for reimbursement for recommended care.   MEDICAL RECOMMENDATIONS: Please have WOC and nursing start to train patient on ostomy management  / changing as part of self-cares Work on weaning off TPN - will ideally be on PO diet for rehab qualification Will need to be off IV pain medications for rehab - currently not using IV fentanyl  so would recommend DC Chronic urinary urgency  - would consider PVRs to ensure no overflow and timed toiletting to promote continence; may need medication Please establish VAC management plan for OP - duration needed and if changes will be done in surgeon's office vs. OP wound clinic (very limited availability).    I have personally performed a face to face diagnostic evaluation of this patient. Additionally, I have examined the patient's medical record including any pertinent labs and radiographic images. If the physician assistant has documented in this note, I have reviewed and edited or otherwise concur with the physician assistant's documentation.  Thanks,  Joesph JAYSON Likes, DO 10/18/2023

## 2023-10-18 NOTE — TOC Initial Note (Signed)
 Transition of Care (TOC) - Initial/Assessment Note  Rayfield Gobble RN, BSN Inpatient Care Management Unit 4E- RN Case Manager See Treatment Team for direct phone #   Patient Details  Name: [name] MRN: [id number] Date of Birth: [birth year]  Transition of Care [facility]) CM/SW Contact:    Gobble Rayfield Hurst, RN Phone Number: 10/18/2023, 4:22 PM  Clinical Narrative:                 Spoke with pt at bedside to discuss transition needs and questions about DME.   Per pt [employer] has reached out to her regarding DME and copay. Pt is requesting [facility] RW and [facility]- per [employer] liaison pt does not meet criteria for [facility] DME based on ht/wt.  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 Explained to pt that INPT rehab would consult to see if she met criteria for intensity of program, etc. As well as that they would have to get insurance approval.  Due to pending CIR consult- will hold off on DME for now- CM will f/u for Bari RW/BSC cost if pt does not meet criteria.   HH has been set up with [facility] should pt go home instead of INPT rehab.   IP CM to continue to follow.   Expected Discharge Plan: IP Rehab Facility Barriers to Discharge: Insurance Authorization   Patient Goals and CMS Choice Patient states their goals for this hospitalization and ongoing recovery are:: need to get stronger before returning home to care for mother who has dementia   Choice offered to / list presented to : Patient      Expected Discharge Plan and Services   Discharge Planning Services: CM Consult Post Acute Care Choice: Durable Medical Equipment, Home Health, IP Rehab Living arrangements for the past 2 months: Single Family Home                 DME Arranged: Bedside commode, Walker rolling DME Agency: [employer] Date DME Agency Contacted: 10/10/23 Time DME Agency Contacted: 1435 Representative  spoke with at DME Agency: Arthea HH Arranged: PT, OT, RN, Nurse's Aide HH Agency: [facility] Date [facility] Agency Contacted: 10/10/23 Time HH Agency Contacted: 1420 Representative spoke with at [facility] Agency: Amy  Prior Living Arrangements/Services Living arrangements for the past 2 months: Single Family Home Lives with:: Self, Parents Patient language and need for interpreter reviewed:: Yes Do you feel safe going back to the place where you live?: No   need rehab      Current home services: DME Criminal Activity/Legal Involvement Pertinent to Current Situation/Hospitalization: No - Comment as needed  Activities of Daily Living   ADL Screening (condition at time of admission) Independently performs ADLs?: Yes (appropriate for developmental age) Is the patient deaf or have difficulty hearing?: No Does the patient have difficulty seeing, even when wearing glasses/contacts?: No Does the patient have difficulty concentrating, remembering, or making decisions?: No  Permission Sought/Granted Permission sought to share information with : Oceanographer granted to share information with : Yes, Verbal Permission Granted     Permission granted to share info w AGENCY: HH/DME        Emotional Assessment Appearance:: Appears stated age Attitude/Demeanor/Rapport: Engaged Affect (typically observed): Appropriate Orientation: : Oriented to Self, Oriented to Place, Oriented to  Time, Oriented to Situation Alcohol / Substance Use:  Not Applicable Psych Involvement: No (comment)  Admission diagnosis:  Perforation of sigmoid colon (HCC) [K63.1] Patient Active Problem List   Diagnosis Date Noted   Septic shock (HCC) 10/16/2023   Postprocedural intraabdominal abscess (HCC) 10/16/2023   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   Bacteremia due to bacteroides species 06/25/2019   Candidal intertrigo 06/25/2019   Pressure injury of skin 06/23/2019   Colonic  diverticular abscess 06/23/2019   Acute kidney injury (AKI) with acute tubular necrosis (ATN) 06/14/2019   Acute diverticulitis 06/11/2019   Preoperative clearance 05/23/2019   Hypertension 12/25/2017   Nonintractable headache 11/29/2017   Cardiac LV ejection fraction of 35-39% 11/26/2017   Medication management 11/26/2017   Chronic low back pain 10/16/2017   Pseudotumor cerebri 10/15/2017   Chronic right-sided low back pain with right-sided sciatica 12/26/2016   Paresthesia 12/26/2016   Right leg swelling 12/26/2016   Physical deconditioning 04/30/2015   Dyspnea 05/11/2014   Leukocytosis 05/11/2014   Chronic fatigue 05/11/2014   Morbid obesity (HCC) 01/22/2014   ILD (interstitial lung disease) (HCC) 01/22/2014   Acute bronchitis 01/08/2014   Chronic respiratory failure with hypoxia (HCC) 11/04/2013   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   Microscopic polyangiitis (HCC) 08/08/2013   Hyperglycemia 07/24/2013   GERD (gastroesophageal reflux disease) 07/24/2013   Hypokalemia 07/20/2013   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HCC) 06/24/2013   Hemoptysis 06/23/2013   Acute blood loss anemia 06/23/2013   Respiratory failure with hypoxia (HCC) 06/23/2013   Diffuse pulmonary alveolar hemorrhage 06/23/2013   Perforation of sigmoid colon - stercoral 09/27/2012   Constipation, chronic 09/27/2012   Obesity (BMI 30-39.9) 09/23/2012   Anxiety associated with depression 09/23/2012   Hx of tobacco use, presenting hazards to health 09/23/2012   H/O gastroesophageal reflux (GERD) 09/23/2012   At high risk for falls 09/23/2012   PCP:  Lynwood Laneta ORN, PA-C Pharmacy:   [facility] [phone] - [place], [place] - [address] [place] [address] Phone: [phone] Fax: [phone]     Social Drivers of Health (SDOH) Social History: SDOH Screenings   Food Insecurity: No Food Insecurity  (10/08/2023)  Housing: Low Risk  (10/08/2023)  Transportation Needs: No Transportation Needs (10/08/2023)  Utilities: Not At Risk (10/08/2023)  Depression (PHQ2-9): Low Risk  (09/03/2019)  Social Connections: Moderately Integrated (10/08/2023)  Tobacco Use: Medium Risk (10/07/2023)   SDOH Interventions: Transportation Interventions: Inpatient TOC, Intervention Not Indicated, Patient Resources (Friends/Family)   Readmission Risk Interventions    10/08/2023    4:30 PM  Readmission Risk Prevention Plan  Post Dischage Appt Complete  Medication Screening Complete  Transportation Screening Complete

## 2023-10-18 NOTE — Progress Notes (Signed)
 Physical Therapy Treatment Patient Details Name: [name] MRN: [id number] DOB: 09-30-1960 Today's Date: 10/18/2023   History of Present Illness Pt is a 63 y/o female presenting on 9/28 via EMS tachypneic, tachycardic and hypotensive, with complaints of left sided abdominal pain that started 7 days prior. CT abdomen pelvis was obtained which revealed marked sigmoid colitis with perforation. Admitted for treatment of Septic shock secondary to sigmoid bowel perforation with feculent peritonitis. S/p 9/28 laparotomy sigmoid resection, colostomy. Intra abdominal abscess; s/p LLQ abdominal drain placement 10/16/23. Extensive past medical history not limited to ANCA associated vasculitis, previous diffuse pulmonary alveolar hemorrhage, chronic immunosuppression type 2 diabetes, HTN, HLD, ILD, previous sigmoid colon perforation, anxiety.    PT Comments  Pt received in supine after premedication, pt agreeable to therapy session and with good participation and improving tolerance for transfer and gait training with bari RW. Pt needing up to CGA to stand from elevated air bed and BSC surfaces and up to CGA with bari RW to perform household distance gait trial with slow cadence and multiple standing rest breaks. Pt instructed on log roll for abdominal comfort and how to sit up from bed without use of rail, will need reinforcement. Pt agreeable to sit up in chair, alarm on for safety at end of session, RN notified pt sitting up without purewick and will need assist when pt requesting to get to toilet due to fall risk and multiple lines. 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   If plan is discharge home, recommend the following: A lot of help with walking and/or transfers;A lot of help with bathing/dressing/bathroom;Assistance with cooking/housework;Direct supervision/assist for medications management;Direct  supervision/assist for financial management;Assist for transportation;Supervision due to cognitive status (mod cues)   Can travel by private vehicle        Equipment Recommendations  BSC/3in1 (bariatric width due to body habitus)    Recommendations for Other Services       Precautions / Restrictions Precautions Precautions: Fall Recall of Precautions/Restrictions: Impaired Precaution/Restrictions Comments: Wound vac, TPN, abdominal protection precs; LLQ JP drain Restrictions Weight Bearing Restrictions Per Provider Order: No     Mobility  Bed Mobility Overal bed mobility: Needs Assistance Bed Mobility: Rolling, Sidelying to Sit Rolling: Supervision Sidelying to sit: Min assist       General bed mobility comments: bed rail removed prior to transfer as pt states she does not have a bed rail at home, HOB lowered to <30 deg prior to performing; HHA for trunk lift assist during log roll to R EOB.    Transfers Overall transfer level: Needs assistance Equipment used: Rolling walker (2 wheels) (bariatric) Transfers: Sit to/from Stand Sit to Stand: From elevated surface, Contact guard assist           General transfer comment: EOB> bari RW, bari BSC<>RW and bari RW>chair, cues for UE placement and safety    Ambulation/Gait Ambulation/Gait assistance: Contact guard assist Gait Distance (Feet): 80 Feet Assistive device: Rolling walker (2 wheels) (bariatric) Gait Pattern/deviations: Step-through pattern, Decreased step length - right, Decreased step length - left, Trunk flexed, Wide base of support Gait velocity: slowed Gait velocity interpretation: <1.31 ft/sec, indicative of household ambulator   General Gait Details: 3 laps in room to door and back, then to chair near window, pt needing cues for activity pacing, posture, breathing through discomfort, RW proximity/mgmt and line assist. No buckling or overt LOB, very slow pace wtih multiple  brief standing breaks to  rest.   Stairs             Wheelchair Mobility     Tilt Bed    Modified Rankin (Stroke Patients Only)       Balance Overall balance assessment: Needs assistance Sitting-balance support: No upper extremity supported, Feet supported Sitting balance-Leahy Scale: Fair     Standing balance support: Bilateral upper extremity supported, During functional activity, Reliant on assistive device for balance Standing balance-Leahy Scale: Poor Standing balance comment: able to perform her own peri-care with single UE support; reliant on RW support for dynamic standing tasks                            Communication Communication Communication: No apparent difficulties  Cognition Arousal: Alert Behavior During Therapy: WFL for tasks assessed/performed, Anxious   PT - Cognitive impairments: Sequencing, Problem solving                       PT - Cognition Comments: Improved attention to task this date, pt likes to know plan for session before performing OOB tasks Following commands: Impaired Following commands impaired: Follows one step commands with increased time, Follows multi-step commands inconsistently    Cueing Cueing Techniques: Verbal cues, Gestural cues, Visual cues  Exercises      General Comments General comments (skin integrity, edema, etc.): SpO2/HR WFL on RA, HR 80's-90 and SpO2 reading 90-95%      Pertinent Vitals/Pain Pain Assessment Pain Assessment: Faces Faces Pain Scale: Hurts a little bit Pain Location: abdomen after premedication Pain Descriptors / Indicators: Discomfort, Operative site guarding, Grimacing Pain Intervention(s): Monitored during session, Premedicated before session, Repositioned    Home Living                          Prior Function            PT Goals (current goals can now be found in the care plan section) Acute Rehab PT Goals Patient Stated Goal: Get back home and to take care of my mother PT  Goal Formulation: With patient Time For Goal Achievement: 10/24/23 Progress towards PT goals: Progressing toward goals    Frequency    Min 2X/week      PT Plan      Co-evaluation              AM-PAC PT 6 Clicks Mobility   Outcome Measure  Help needed turning from your back to your side while in a flat bed without using bedrails?: A Little Help needed moving from lying on your back to sitting on the side of a flat bed without using bedrails?: A Lot (w/o rail or HOB elevated) Help needed moving to and from a bed to a chair (including a wheelchair)?: A Little Help needed standing up from a chair using your arms (e.g., wheelchair or bedside chair)?: A Lot (lower height) Help needed to walk in hospital room?: A Little Help needed climbing 3-5 steps with a railing? : Total 6 Click Score: 14    End of Session Equipment Utilized During Treatment: Gait belt Activity Tolerance: Patient tolerated treatment well Patient left: in chair;with call bell/phone within reach;with chair alarm set;Other (comment) (pt requesting to keep her feet down) Nurse Communication: Mobility status PT Visit Diagnosis: Unsteadiness on feet (R26.81);Other abnormalities of gait and mobility (R26.89);Muscle weakness (generalized) (M62.81);Difficulty in walking, not elsewhere  classified (R26.2);Pain Pain - part of body:  (abdomen)     Time: 8492-8469 PT Time Calculation (min) (ACUTE ONLY): 23 min  Charges:    $Gait Training: 8-22 mins $Therapeutic Activity: 8-22 mins PT General Charges $$ ACUTE PT VISIT: 1 Visit                     Taiwan Talcott P., PTA Acute Rehabilitation Services Secure Chat Preferred 9a-5:30pm Office: [phone]    Connell CHRISTELLA Blue 10/18/2023, 3:47 PM

## 2023-10-18 NOTE — Progress Notes (Signed)
 PROGRESS NOTE    [name]  FMW:[id number] DOB: [birth year] DOA: 10/07/2023 PCP: Lynwood Laneta ORN, PA-C    Brief Narrative:   63 year old female with past medical history significant for ANCA vasculitis, chronic immunosuppression, type 2 diabetes, hypertension, hyperlipidemia, ILD and previous sigmoid colon perforation who presented to the ER via EMS with left-sided abdominal pain that began 7 days prior to admission.  On arrival patient was hypothermic and admitted with fever, tachypnea, tachycardic and hypotensive.  Workup revealed mild sigmoid colitis with perforation.  Patient admitted to ICU for septic shock.  She underwent exploratory laparotomy on 9/28, sigmoid bowel perforation with feculent peritonitis noted.  She was transferred to [facility] on 10/4.  She remains on TPN.  Has been requiring frequent pain medications and is noted to have softer blood pressures but blood pressure seems to have improved in the past several days, remains on midodrine.  Slowly she has regained her bowel function.  CT 10/6 shows multiple abscess in the abdomen, plan for IR drainage today.   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  Has wound VAC to abdominal wound.  Patient is now on TPN and requiring pain management.  Managed by general surgery. -Repeat CT abdomen/pelvis 10/6 showed multiple intraperitoneal abscess requiring drain placement by IR on 10/7. Contact IR prior to dc for follow up care arrangement -Antibiotics per general surgery, currently on Zosyn  -Will continue to wean down midodrine as able.    ANCA associated vasculitis/ILD  on methotrexate /CellCept  at baseline, currently on hold due to infection  continue incruse   Diabetes mellitus type 2  Hemoglobin A1c is 4.6 on 10/07/2023.  Blood sugars reading are in acceptable range.   NICM   On Coreg , Entresto  PTA.  Will  continue to hold at this time due to hypotension issues.  Resume gradually as appropriate.   Borderline hyperkalemia  Will recheck labs intermittently.   Anxiety  Continue alprazolam , bupropion , fluoxetine   Physical deconditioning: PT OT following. Possible CIR.    DVT prophylaxis: SQ Heparin     Code Status: Full Code Family Communication:   Status is: Inpatient Remains inpatient appropriate because: On going post op management.    PT Follow up Recs: Acute Inpatient Rehab (3hours/Day)10/17/2023 1646  Subjective:  Feels okay no complaints.  Set out of the chair for several hours yesterday.  Oral intake is still poor  Examination:  General exam: Appears calm and comfortable  Respiratory system: Clear to auscultation. Respiratory effort normal. Cardiovascular system: S1 & S2 heard, RRR. No JVD, murmurs, rubs, gallops or clicks. No pedal edema. Gastrointestinal system: Abdomen is nondistended, soft and nontender. No organomegaly or masses felt. Normal bowel sounds heard. Central nervous system: Alert and oriented. No focal neurological deficits. Extremities: Symmetric 5 x 5 power. Skin: Surgical wound in the abdomen noted, left lower quadrant drain with sanguinous fluid.  Ostomy in place Psychiatry: Judgement and insight appear normal. Mood & affect appropriate. Right upper extremity PICC line in place               Diet Orders (From admission, onward)     Start     Ordered   10/16/23 1549  DIET SOFT Room service appropriate? Yes; Fluid consistency: Thin  Diet effective now       Question Answer Comment  Room service appropriate? Yes   Fluid consistency: Thin      10/16/23 1548  Objective: Vitals:   10/18/23 0500 10/18/23 0741 10/18/23 0825 10/18/23 1145  BP:  95/73  131/73  Pulse:  74  82  Resp:  14  19  Temp:  97.7 F (36.5 C)  98.2 F (36.8 C)  TempSrc:  Oral  Oral  SpO2:  97% 97% 100%  Weight: 122.3 kg     Height:         Intake/Output Summary (Last 24 hours) at 10/18/2023 1211 Last data filed at 10/18/2023 0917 Gross per 24 hour  Intake 1923.49 ml  Output 3555 ml  Net -1631.51 ml   Filed Weights   10/16/23 0702 10/17/23 0500 10/18/23 0500  Weight: 121.6 kg 122.3 kg 122.3 kg    Scheduled Meds:  acetaminophen   1,000 mg Oral QID   ascorbic acid  250 mg Oral BID   buPROPion   450 mg Oral Daily   Chlorhexidine  Gluconate Cloth  6 each Topical Daily   docusate sodium   100 mg Oral BID   feeding supplement  237 mL Oral BID BM   FLUoxetine  40 mg Oral Daily   heparin  injection (subcutaneous)  5,000 Units Subcutaneous Q8H   insulin  aspart  0-9 Units Subcutaneous Q8H   lactose free nutrition  237 mL Oral BID BM   lidocaine   2 patch Transdermal Q24H   methocarbamol  1,000 mg Oral QID   midodrine  10 mg Oral TID WC   multivitamin with minerals  1 tablet Oral Daily   mupirocin ointment   Nasal BID   nutrition supplement (JUVEN)  1 packet Oral BID BM   ondansetron  (ZOFRAN ) IV  4 mg Intravenous Q6H   oxyCODONE   5 mg Oral Once   polyethylene glycol  17 g Oral Daily   pregabalin  50 mg Oral BID   sodium chloride  flush  10-40 mL Intracatheter Q12H   umeclidinium bromide  1 puff Inhalation Daily   vitamin A  10,000 Units Oral Daily   Continuous Infusions:  micafungin (MYCAMINE) 150 mg in sodium chloride  0.9 % 100 mL IVPB 150 mg (10/17/23 2146)   piperacillin -tazobactam 3.375 g (10/18/23 0609)   TPN ADULT (ION) 75 mL/hr at 10/17/23 1744   TPN ADULT (ION)      Nutritional status Signs/Symptoms: NPO status Interventions: TPN Body mass index is 39.82 kg/m.  Data Reviewed:   CBC: Recent Labs  Lab 10/15/23 1026 10/17/23 0610 10/18/23 0320  WBC 13.7* 14.0* 11.6*  NEUTROABS  --  10.8*  --   HGB 9.7* 8.8* 8.9*  HCT 31.4* 27.9* 28.2*  MCV 90.2 89.4 89.8  PLT 304 335 360   Basic Metabolic Panel: Recent Labs  Lab 10/12/23 0345 10/13/23 0617 10/14/23 0500 10/15/23 1026 10/18/23 0320  NA 136  139 137 136 136  K 4.7 5.2* 5.0 4.2 4.0  CL 104 108 104 105 103  CO2 22 24 23 25 24   GLUCOSE 114* 88 112* 127* 115*  BUN 37* 32* 26* 23 23  CREATININE 1.22* 0.99 0.96 0.95 1.10*  CALCIUM  7.8* 7.9* 8.1* 8.1* 8.1*  MG 2.2 1.8 1.9 1.9 2.1  PHOS 2.6 4.1 3.9 3.4 4.2   GFR: Estimated Creatinine Clearance: 74.2 mL/min (A) (by C-G formula based on SCr of 1.1 mg/dL (H)). Liver Function Tests: Recent Labs  Lab 10/11/23 1330 10/12/23 0345 10/15/23 1026 10/18/23 0320  AST 18  --  15 16  ALT 11  --  10 13  ALKPHOS 63  --  85 118  BILITOT 0.4  --  0.6  0.5  PROT 6.0*  --  6.3* 5.8*  ALBUMIN  2.0* 1.6* 1.7* 1.7*   No results for input(s): LIPASE, AMYLASE in the last 168 hours. No results for input(s): AMMONIA in the last 168 hours. Coagulation Profile: Recent Labs  Lab 10/16/23 1149  INR 1.2   Cardiac Enzymes: No results for input(s): CKTOTAL, CKMB, CKMBINDEX, TROPONINI in the last 168 hours. BNP (last 3 results) No results for input(s): PROBNP in the last 8760 hours. HbA1C: No results for input(s): HGBA1C in the last 72 hours. CBG: Recent Labs  Lab 10/16/23 2318 10/17/23 0629 10/17/23 1030 10/18/23 0026 10/18/23 0614  GLUCAP 100* 101* 105* 122* 103*   Lipid Profile: No results for input(s): CHOL, HDL, LDLCALC, TRIG, CHOLHDL, LDLDIRECT in the last 72 hours. Thyroid Function Tests: No results for input(s): TSH, T4TOTAL, FREET4, T3FREE, THYROIDAB in the last 72 hours. Anemia Panel: No results for input(s): VITAMINB12, FOLATE, FERRITIN, TIBC, IRON, RETICCTPCT in the last 72 hours. Sepsis Labs: No results for input(s): PROCALCITON, LATICACIDVEN in the last 168 hours.  Recent Results (from the past 240 hours)  Aerobic/Anaerobic Culture w Gram Stain (surgical/deep wound)     Status: None (Preliminary result)   Collection Time: 10/16/23  3:09 PM   Specimen: Abdomen; Body Fluid  Result Value Ref Range Status   Specimen  Description ABDOMEN FLUID  Final   Special Requests NONE  Final   Gram Stain   Final    RARE WBC PRESENT, PREDOMINANTLY PMN NO ORGANISMS SEEN    Culture   Final    NO GROWTH 2 DAYS NO ANAEROBES ISOLATED; CULTURE IN PROGRESS FOR 5 DAYS Performed at [facility], [address], [place], [place] [address]    Report Status PENDING  Incomplete         Radiology Studies: CT GUIDED SOFT TISSUE FLUID DRAIN BY PERC CATH Result Date: 10/16/2023 INDICATION: 63 year old with history of sigmoid bowel perforation with peritonitis. Status post sigmoid colon resection and colostomy. Patient has postoperative fluid collections. Patient presents for CT-guided drain placement. EXAM: CT-GUIDED PLACEMENT OF DRAINAGE CATHETER IN LEFT LOWER ABDOMINAL FLUID COLLECTION TECHNIQUE: Multidetector CT imaging of the abdomen and pelvis was performed following the standard protocol without IV contrast. RADIATION DOSE REDUCTION: This exam was performed according to the departmental dose-optimization program which includes automated exposure control, adjustment of the mA and/or kV according to patient size and/or use of iterative reconstruction technique. MEDICATIONS: Moderate sedation ANESTHESIA/SEDATION: Moderate (conscious) sedation was employed during this procedure. A total of Versed  1 mg and Fentanyl  75 mcg was administered intravenously by the radiology nurse. Total intra-service moderate Sedation Time: 32 minutes. The patient's level of consciousness and vital signs were monitored continuously by radiology nursing throughout the procedure under my direct supervision. COMPLICATIONS: None immediate. PROCEDURE: Informed written consent was obtained from the patient after a thorough discussion of the procedural risks, benefits and alternatives. All questions were addressed. Maximal Sterile Barrier Technique was utilized including caps, mask, sterile gowns, sterile gloves, sterile drape, hand hygiene and skin antiseptic.  A timeout was performed prior to the initiation of the procedure. Patient was placed supine. CT images through the lower abdomen and pelvis were obtained. The fluid collection in the anterior left lower quadrant was identified and targeted. Left lower abdomen was prepped with chlorhexidine  and sterile field was created. Skin was anesthetized using 1% lidocaine . Small incision was made. Using CT guidance, an 18 gauge trocar needle was directed into the left lower quadrant fluid collection. Amber colored fluid  was aspirated. Superstiff Amplatz wire was advanced into the collection. Tract was dilated to accommodate a 10 [place] multipurpose drain. Fluid was collected and sent for culture. Drain was sutured to skin and attached to a suction bulb. Bandage was placed. FINDINGS: 84 French drain placed within the anterior left lower quadrant collection. Majority of the fluid was aspirated. Amber colored fluid was aspirated from the drain. IMPRESSION: CT-guided placement of drainage catheter in the anterior left lower abdominal fluid collection. Electronically Signed   By: Juliene Balder M.D.   On: 10/16/2023 20:17           LOS: 11 days   Time spent= 35 mins    Burgess JAYSON Dare, MD [facility]  If 7PM-7AM, please contact night-coverage  10/18/2023, 12:11 PM

## 2023-10-18 NOTE — Progress Notes (Signed)
 PHARMACY - TOTAL PARENTERAL NUTRITION CONSULT NOTE   Indication: Prolonged ileus  Patient Measurements: Height: 5' 9 (175.3 cm) Weight: 122.3 kg (269 lb 10 oz) IBW/kg (Calculated) : 66.2 TPN AdjBW (KG): 79.1 Body mass index is 39.82 kg/m.   Assessment:  73 YOF admitted w/ HPI significant for left sided abdominal pain that began 7 days PTA with associated decreased oral intake, found to have perforated sigmoid colon who is s/p emergent exlap w/ resection / Hartmann's colostomy. She requires TPN for anticipated post-op ileus and prolonged inability to obtain nutrition via oral route.   Noted that TPN was unable to infuse on 10/7 due to access issues. IV team has assessed this morning and line is now functioning with good blood return.    Glucose / Insulin : HgbA1c 4.6 / BG 103-115, 10 units regular insulin  in TPN, no addl SSI, Q8H CBG checks  Electrolytes:  10/6 labs - K: 4.0 (none in TPN), CoCa 9.9, Mg 2.1, others wnl Renal: Scr 0.95, BUN 23 Hepatic: AST/ ALT/alk Phos/ Tbili wnl, alb 1.7, TG 86 (10/1) Intake / Output; MIVF: UOP 0.9 ml/kg/hr, drains 80ml, colostomy 375 ml but charted LBM 10/8   GI Imaging: 9/28 CT abd shows sigmoid colitis w/ perf, extraluminal gas + stool extending into small bowel mesentery, extensive inflammation changes throughout lower abd + pelvic mesentery + peritoneal thickening GI Surgeries / Procedures:  10/6 CT abdomen: fluid collections concerning for post-op abscess  9/28 Exlap w/ resection and Hartmann's colostomy  Central access: 9/28 TPN start date: 9/29  Nutritional Goals: Goal TPN rate is 75 mL/hr (provides 100 g of protein and 2016 kcals per day)  RD Assessment: Estimated Needs Total Energy Estimated Needs: 1850-2050 Total Protein Estimated Needs: 95-110g Total Fluid Estimated Needs: >/=1.8L  Current Nutrition:  TPN 9/29 >> 10/3 CLD: Boost x2 given to patient 10/4 FLD: 1 Boost, some pudding 10/5 FLD: 1 vanilla ice cream, working on  Boost. Ostomy leakage overnight >replaced, abdominal irritation/cramping, especially after dairy products but was never lactose-intolerant before; Small amount of liquid in ostomy  10/5 Soft diet - calorie count started on 10/5, remains with low PO intake per discussion with RD  10/7 NPO for IR placement of drains   10/8 diet soft with calorie count, ensure x 2 on 10/8   Plan:  Half TPN to 8mL/hr at 1800, which provides 50% estimated nutritional needs per [facility].  Electrolytes in TPN: Na 100 mEq/L, K 0 mEq/L, Ca 3 mEq/L, Mg 10 mEq/L, Phos 10 mmol/L. Cl:Ac 1:1 Continue standard MVI and trace elements to TPN outside of TPN.  Remove insulin  from TPN.  Add sensitive SSI Q8H.  Monitor TPN labs on Mon/Thurs, and prn F/u patient toleration of soft diet and daily calorie count.   Thank you for allowing pharmacy to be a part of this patient's care.  Powell Blush, PharmD, BCCCP  Clinical Pharmacist

## 2023-10-18 NOTE — PMR Pre-admission (Signed)
 PMR Admission Coordinator Pre-Admission Assessment  Patient: [name] is an 63 y.o., female MRN: [id number] DOB: [birth year] Height: 5' 9 (175.3 cm) Weight: 122.3 kg  Insurance Information HMO:     PPO:      PCP:      IPA:      80/20:      OTHER:  PRIMARY: Aetna NAP      Policy#: [id number]       Subscriber: pt CM Name: [employer]      Phone#: [phone]     Fax#: [phone] Pre-Cert#: [id number] Received insurance authorization from [employer] with [employer] on 10/19/23. Pt approved from 10/19/23-10/25/23. Updates due on 10/26/23.     Employer:  Benefits:  Phone #: online [email or web address]     Name:  Eff. Date: 01/09/22-still active     Deduct: $2,500 ($2,500 met)     Out of Pocket Max: $6,550 ($4,271.63 met)      Life Max: NA CIR: 70% coverage, 30% co-insurance      SNF: 70% coverage, 30% co-insurance Outpatient: 70% coverage     Co-Pay: 30% co-insurance Home Health: 70% coverage      Co-Pay: 30% co-insurance DME: 70% coverage     Co-Pay: 30% co-insurance Providers: in-network SECONDARY:       Policy#:      Phone#:   Artist:       Phone#:   The Data processing manager" for patients in Inpatient Rehabilitation Facilities with attached "Privacy Act Statement-Health Care Records" was provided and verbally reviewed with: Patient and Family  Emergency Contact Information Contact Information     Name Relation Home Work Mobile   Bartunek,John Spouse [phone]     Debby Holley Ahumada [phone]  [phone]      Other Contacts   None on File     Current Medical History  Patient Admitting Diagnosis: sepsis, feculent peritonitis, perforated sigmoid colon s/p Hartman's  History of Present Illness: Pt is a 63 y/o female with PMH of ANCA associated vasculitis, diffuse pulmonary alveolar hemorrhage, immunosuppression, DM, HTN< ILD< and previous sigmoid colon perforation, anxiety who was admitted to [facility] on 9/28 with 7 day history  of left sided abdominal pain.  In ED she was tachycardic, hypotensive, febrile to 100.7, labs notable for K 3.2, glucose 148, creatinine 1.44, albumin  2.9, WBC 12.3.  CT ab/pelvis obtained which revealed marked sigmoid colitis with perofration evidence by extraluminal gas and stool which extended into the small bowel mesentery with extensive inflammation and early fluid collection.  On 9/28 she underwent ex lap for sigmoid bowel perforation with feculent peritonitis.  Pos operatively she required pressor support.  SHe is on TPN and IV abx/antifungals.  Wound vac to RLQ and ostomy.  Therapy ongoing and pt has been recommended for CIR.      Patient's medical record from [facility] has been reviewed by the rehabilitation admission coordinator and physician.  Past Medical History  Past Medical History:  Diagnosis Date   Acute blood loss anemia 06/23/2013   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HCC)    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HCC) 06/24/2013   Anxiety associated with depression 09/23/2012   At high risk for falls 09/23/2012   She has had a fractured ankle and tendon tear with falls over the last couple years.   Cardiomyopathy (HCC)    Chronic fatigue 05/11/2014   Chronic right-sided low back pain with right-sided sciatica 12/26/2016   Colitis    Constipation, chronic 09/27/2012   Diabetes mellitus without complication (HCC)  Diffuse pulmonary alveolar hemorrhage 06/23/2013   Diverticulitis of large intestine with perforation 09/23/2012   Dyspnea 05/11/2014   GERD (gastroesophageal reflux disease)    Hemoptysis 06/23/2013   History of endometriosis 1997   Hx of tobacco use, presenting hazards to health 09/23/2012   Hyperglycemia 07/24/2013   Hypertension    Hypokalemia 07/20/2013   ILD (interstitial lung disease) (HCC) 01/22/2014   Leukocytosis 05/11/2014   MPA (microscopic polyangiitis) (HCC) 2015   Numbness    Obesity (BMI 30-39.9) 09/23/2012   Paresthesia 12/26/2016   Perforation of sigmoid colon - stercoral  09/27/2012   Physical deconditioning 04/30/2015   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   Respiratory failure with hypoxia (HCC) 06/23/2013   Right leg swelling 12/26/2016    Has the patient had major surgery during 100 days prior to admission? Yes  Family History   family history includes Birth defects in her cousin; Breast cancer in her paternal grandmother; CAD in an other family member; Diabetes in her father and mother; Hypertension in her father; Obesity in her father.  Current Medications  Current Facility-Administered Medications:    acetaminophen  (TYLENOL ) tablet 1,000 mg, 1,000 mg, Oral, QID, Simaan, Elizabeth S, PA-C, 1,000 mg at 10/17/23 2146   ALPRAZolam  (XANAX ) tablet 0.5 mg, 0.5 mg, Oral, BID PRN, Neda, Adewale A, MD, 0.5 mg at 10/17/23 2147   ascorbic acid (VITAMIN C) tablet 250 mg, 250 mg, Oral, BID, Sigdel, Santosh, MD, 250 mg at 10/18/23 9075   buPROPion  (WELLBUTRIN  XL) 24 hr tablet 450 mg, 450 mg, Oral, Daily, Pham, Minh Q, RPH-CPP, 450 mg at 10/18/23 9076   Chlorhexidine  Gluconate Cloth 2 % PADS 6 each, 6 each, Topical, Daily, Ramirez, Armando, MD, 6 each at 10/18/23 0917   docusate (COLACE) 50 MG/5ML liquid 100 mg, 100 mg, Oral, BID PRN, Neda, Adewale A, MD, 100 mg at 10/10/23 2224   docusate sodium  (COLACE) capsule 100 mg, 100 mg, Oral, BID, Simaan, Elizabeth S, PA-C, 100 mg at 10/18/23 9076   feeding supplement (ENSURE PLUS HIGH PROTEIN) liquid 237 mL, 237 mL, Oral, BID BM, Sigdel, Santosh, MD, 237 mL at 10/18/23 0920   fentaNYL  (SUBLIMAZE ) injection 50 mcg, 50 mcg, Intravenous, Q4H PRN, Simaan, Elizabeth S, PA-C   FLUoxetine (PROZAC) capsule 40 mg, 40 mg, Oral, Daily, Pham, Minh Q, RPH-CPP, 40 mg at 10/18/23 0923   glucagon (human recombinant) (GLUCAGEN) injection 1 mg, 1 mg, Intravenous, PRN, Amin, Ankit C, MD   guaiFENesin (ROBITUSSIN) 100 MG/5ML liquid 5 mL, 5 mL, Oral, Q4H PRN, Amin, Ankit C, MD   heparin  injection 5,000 Units, 5,000 Units, Subcutaneous,  Q8H, Simpson, Paula B, NP, 5,000 Units at 10/18/23 0608   hydrALAZINE  (APRESOLINE ) injection 10 mg, 10 mg, Intravenous, Q4H PRN, Amin, Ankit C, MD   insulin  aspart (novoLOG ) injection 0-9 Units, 0-9 Units, Subcutaneous, Q8H, Wilson, Heather W, RPH   ipratropium-albuterol  (DUONEB) 0.5-2.5 (3) MG/3ML nebulizer solution 3 mL, 3 mL, Nebulization, Q4H PRN, Amin, Ankit C, MD   lactose free nutrition (BOOST PLUS) liquid 237 mL, 237 mL, Oral, BID BM, Simaan, Elizabeth S, PA-C   lidocaine  (LIDODERM ) 5 % 2 patch, 2 patch, Transdermal, Q24H, Simaan, Elizabeth S, PA-C, 2 patch at 10/18/23 0921   methocarbamol (ROBAXIN) tablet 1,000 mg, 1,000 mg, Oral, QID, Simaan, Elizabeth S, PA-C, 1,000 mg at 10/18/23 9076   metoprolol  tartrate (LOPRESSOR ) injection 5 mg, 5 mg, Intravenous, Q4H PRN, Amin, Ankit C, MD   micafungin (MYCAMINE) 150 mg in sodium chloride  0.9 % 100 mL IVPB, 150 mg,  Intravenous, Q24H, Rubin Calamity, MD, Last Rate: 107.5 mL/hr at 10/17/23 2146, 150 mg at 10/17/23 2146   midodrine (PROAMATINE) tablet 10 mg, 10 mg, Oral, TID WC, Olalere, Adewale A, MD, 10 mg at 10/18/23 9076   multivitamin with minerals tablet 1 tablet, 1 tablet, Oral, Daily, Chen, Lydia D, [facility], 1 tablet at 10/18/23 0924   mupirocin ointment (BACTROBAN) 2 %, , Nasal, BID, Olalere, Adewale A, MD, Given at 10/18/23 9074   nutrition supplement (JUVEN) (JUVEN) powder packet 1 packet, 1 packet, Oral, BID BM, Sigdel, Santosh, MD, 1 packet at 10/17/23 1715   ondansetron  (ZOFRAN ) injection 4 mg, 4 mg, Intravenous, Q6H, Sigdel, Santosh, MD, 4 mg at 10/18/23 0608   Oral care mouth rinse, 15 mL, Mouth Rinse, PRN, Olalere, Adewale A, MD   oxyCODONE  (Oxy IR/ROXICODONE ) immediate release tablet 5 mg, 5 mg, Oral, Once, Paliwal, Aditya, MD   oxyCODONE  (Oxy IR/ROXICODONE ) immediate release tablet 5-10 mg, 5-10 mg, Oral, Q6H PRN, Simaan, Elizabeth S, PA-C, 10 mg at 10/18/23 0322   phenol (CHLORASEPTIC) mouth spray 1 spray, 1 spray, Mouth/Throat, PRN,  Simaan, Elizabeth S, PA-C   [COMPLETED] piperacillin -tazobactam (ZOSYN ) IVPB 3.375 g, 3.375 g, Intravenous, Once, Stopped at 10/07/23 1502 **FOLLOWED BY** piperacillin -tazobactam (ZOSYN ) IVPB 3.375 g, 3.375 g, Intravenous, Q8H, Rubin Calamity, MD, Last Rate: 12.5 mL/hr at 10/18/23 0609, 3.375 g at 10/18/23 9390   polyethylene glycol (MIRALAX  / GLYCOLAX ) packet 17 g, 17 g, Oral, Daily, Antonetta Moccasin B, NP, 17 g at 10/15/23 0837   pregabalin (LYRICA) capsule 50 mg, 50 mg, Oral, BID, Simpson, Paula B, NP, 50 mg at 10/18/23 9076   prochlorperazine (COMPAZINE) injection 10 mg, 10 mg, Intravenous, Q6H PRN, Segars, Jonathan, MD, 10 mg at 10/17/23 2134   sodium chloride  flush (NS) 0.9 % injection 10-40 mL, 10-40 mL, Intracatheter, Q12H, Olalere, Adewale A, MD, 20 mL at 10/18/23 0925   sodium chloride  flush (NS) 0.9 % injection 10-40 mL, 10-40 mL, Intracatheter, PRN, Olalere, Adewale A, MD, 10 mL at 10/14/23 2128   TPN ADULT (ION), , Intravenous, Continuous TPN, Tanda Powell ORN, [facility], Last Rate: 75 mL/hr at 10/17/23 1744, Infusion Verify at 10/17/23 1744   TPN ADULT (ION), , Intravenous, Continuous TPN, Tanda Powell ORN, RPH   umeclidinium bromide (INCRUSE ELLIPTA) 62.5 MCG/ACT 1 puff, 1 puff, Inhalation, Daily, Antonetta Moccasin B, NP, 1 puff at 10/18/23 0825   vitamin A capsule 10,000 Units, 10,000 Units, Oral, Daily, Sigdel, Santosh, MD, 10,000 Units at 10/18/23 9075  Patients Current Diet:  Diet Order             DIET SOFT Room service appropriate? Yes; Fluid consistency: Thin  Diet effective now                   Precautions / Restrictions Precautions Precautions: Fall Precaution/Restrictions Comments: Wound vac, TPN, abdominal protection precs; LLQ JP drain Restrictions Weight Bearing Restrictions Per Provider Order: No   Has the patient had 2 or more falls or a fall with injury in the past year? No  Prior Activity Level Community (5-7x/wk): independent, no DME, helping her sister  care for their mom who has dementia  Prior Functional Level Self Care: Did the patient need help bathing, dressing, using the toilet or eating? Independent  Indoor Mobility: Did the patient need assistance with walking from room to room (with or without device)? Independent  Stairs: Did the patient need assistance with internal or external stairs (with or without device)? Independent  Functional Cognition: Did the  patient need help planning regular tasks such as shopping or remembering to take medications? Independent  Patient Information Are you of Hispanic, Latino/a,or Spanish origin?: A. No, not of Hispanic, Latino/a, or Spanish origin What is your race?: A. White Do you need or want an interpreter to communicate with a doctor or health care staff?: 0. No  Patient's Response To:  Health Literacy and Transportation Is the patient able to respond to health literacy and transportation needs?: Yes Health Literacy - How often do you need to have someone help you when you read instructions, pamphlets, or other written material from your doctor or pharmacy?: Never In the past 12 months, has lack of transportation kept you from medical appointments or from getting medications?: No In the past 12 months, has lack of transportation kept you from meetings, work, or from getting things needed for daily living?: No  Home Assistive Devices / Equipment Home Equipment: Tub bench, Grab bars - tub/shower, Hand held shower head  Prior Device Use: Indicate devices/aids used by the patient prior to current illness, exacerbation or injury? None of the above  Current Functional Level Cognition  Orientation Level: Oriented X4    Extremity Assessment (includes Sensation/Coordination)  Upper Extremity Assessment: Generalized weakness  Lower Extremity Assessment: Defer to PT evaluation    ADLs  Overall ADL's : Needs assistance/impaired Grooming: Set up, Sitting Upper Body Dressing : Set up,  Sitting Lower Body Dressing: Maximal assistance, Sit to/from stand Toilet Transfer: Minimal assistance, +2 for safety/equipment, Rolling walker (2 wheels), Maximal assistance, +2 for physical assistance, Regular Toilet, Ambulation, Grab bars Toilet Transfer Details (indicate cue type and reason): Min assist from bed height, max +2 from low toilet heavy reliance on GB Toileting- Clothing Manipulation and Hygiene: Supervision/safety, Sitting/lateral lean Functional mobility during ADLs: Minimal assistance, +2 for safety/equipment General ADL Comments: Decreased strength when standing from low height, poor activity tolerance    Mobility  Overal bed mobility: Needs Assistance Bed Mobility: Supine to Sit Supine to sit: Mod assist, HOB elevated, Used rails Sit to supine: +2 for safety/equipment, HOB elevated, Used rails, Contact guard assist General bed mobility comments: pt educated on abdominal protection with log roll technique but pt declines voicing Let me just show you how I do it, need up to mod assist for trunk support and therapist encouraging partial roll to protect abd.    Transfers  Overall transfer level: Needs assistance Equipment used: Rolling walker (2 wheels) (bariatric) Transfers: Sit to/from Stand Sit to Stand: From elevated surface, Min assist, +2 safety/equipment, Mod assist Bed to/from chair/wheelchair/BSC transfer type:: Step pivot Step pivot transfers: Max assist, +2 physical assistance, +2 safety/equipment General transfer comment: from EOB with min assist, from low toilet seat with wall rail and +2 modA to RW after x 5 unsuccessful attempts with +1 min to modA. RW>chair with cues for safe UE placement.    Ambulation / Gait / Stairs / Wheelchair Mobility  Ambulation/Gait Ambulation/Gait assistance: Contact guard assist, Min assist, +2 safety/equipment Gait Distance (Feet): 75 Feet Assistive device: Rolling walker (2 wheels) (bariatric) Gait Pattern/deviations:  Step-through pattern, Decreased step length - right, Decreased step length - left, Trunk flexed, Wide base of support General Gait Details: CGA to Min physical assist for RW management and steadying, esp when using rollator; ~44ft to bathroom with bari RW, then ~64ft with bari rollator. Second person for line assist and preparing to offer chair follow PRN for safety; vc for sequencing, posture and activity pacing. Pt reports [place] RW feels more  stable to her than rollator, case mgmt notified after session. Gait velocity: slowed Gait velocity interpretation: <1.31 ft/sec, indicative of household ambulator    Posture / Balance Balance Overall balance assessment: Needs assistance Sitting-balance support: No upper extremity supported, Feet supported Sitting balance-Leahy Scale: Fair Standing balance support: Bilateral upper extremity supported, During functional activity, Reliant on assistive device for balance Standing balance-Leahy Scale: Poor Standing balance comment: reliant on RW support for dynamic standing tasks    Special considerations/life events  Wound Vac yes, RLQ, Skin RLQ surgical wound, ostomy, and Diabetic management yes   Previous Home Environment (from acute therapy documentation) Living Arrangements: Other (Comment) (going to stay at her sisters house) Available Help at Discharge: Family, Available 24 hours/day (sister) Type of Home: Mobile home Home Layout: One level Home Access: Level entry Bathroom Shower/Tub: Engineer, manufacturing systems: Standard Bathroom Accessibility: Yes Home Care Services: No  Discharge Living Setting Plans for Discharge Living Setting: Lives with (comment) (sister) Type of Home at Discharge: Mobile home Discharge Home Layout: One level Discharge Home Access: Level entry Discharge Bathroom Shower/Tub: Tub/shower unit Discharge Bathroom Toilet: Standard Discharge Bathroom Accessibility: Yes How Accessible: Accessible via walker Does the  patient have any problems obtaining your medications?: No  Social/Family/Support Systems Patient Roles: Spouse Contact Information: spouse is long distance truck driver, [name]  [phone] Anticipated Caregiver: sister, [name] Anticipated Caregiver's Contact Information: [phone] Ability/Limitations of Caregiver: supervision Caregiver Availability: 24/7 Discharge Plan Discussed with Primary Caregiver: Yes Is Caregiver In Agreement with Plan?: Yes Does Caregiver/Family have Issues with Lodging/Transportation while Pt is in Rehab?: No  Goals Patient/Family Goal for Rehab: PT/OT mod I, SLP n/a Expected length of stay: 6-9 days Additional Information: Discharge plan: mod I goals, return home to sister's home Pt/Family Agrees to Admission and willing to participate: Yes Program Orientation Provided & Reviewed with Pt/Caregiver Including Roles  & Responsibilities: Yes  Decrease burden of Care through IP rehab admission: n/a  Possible need for SNF placement upon discharge:  Not anticipated.  Plan for discharge home to sister's home, mod I goals.   Patient Condition: This patient's condition remains as documented in the consult dated 10/18/23, in which the Rehabilitation Physician determined and documented that the patient's condition is appropriate for intensive rehabilitative care in an inpatient rehabilitation facility. Wil.  Preadmission Screen Completed By:  Caitlin E Warren, PT, DPT with updates by Tinnie Yvone Cohens 10/18/2023 11:09 AM ______________________________________________________________________   Discussed status with Dr. Carilyn  on 10/20/23 at 900 and received approval for admission today.  Admission Coordinator:  Caitlin E Warren, PT, DPT time 900/Date 10/20/23   Assessment/Plan: Diagnosis: debility after sepsis Does the need for close, 24 hr/day Medical supervision in concert with the patient's rehab needs make it unreasonable for this patient to be served in a less  intensive setting? Yes Co-Morbidities requiring supervision/potential complications: s/p new colostomy, complex wound care with wound vac and abd drain.  Intra abdominal infaection on IV antibiotics Due to bladder management, bowel management, safety, skin/wound care, disease management, medication administration, pain management, and patient education, does the patient require 24 hr/day rehab nursing? Yes Does the patient require coordinated care of a physician, rehab nurse, PT, OT, to address physical and functional deficits in the context of the above medical diagnosis(es)? Yes Addressing deficits in the following areas: balance, endurance, locomotion, strength, transferring, bowel/bladder control, bathing, dressing, feeding, grooming, toileting, and psychosocial support Can the patient actively participate in an intensive therapy program of at least 3 hrs of therapy 5  days a week? Yes The potential for patient to make measurable gains while on inpatient rehab is good Anticipated functional outcomes upon discharge from inpatient rehab: modified independent and supervision PT, modified independent and supervision OT, n/a SLP Estimated rehab length of stay to reach the above functional goals is: 6-9d Anticipated discharge destination: Home 10. Overall Rehab/Functional Prognosis: good   MD Signature: Prentice CHARLENA Compton M.D. [facility] Fellow Am Acad of Phys Med and Rehab Diplomate Am Board of Electrodiagnostic Med Fellow Am Board of Interventional Pain

## 2023-10-18 NOTE — Progress Notes (Signed)
 Calorie Count Note  Calorie count ordered.   Diet: GI soft Supplements: Ensure Plus High Protein BID   Estimated Nutritional Needs:  Kcal:  1850-2050 Protein:  95-110g Fluid:  >/=1.8L  Breakfast: 280 kcal and 10g protein Lunch: 0% Dinner: 294 kcal and 10g protein  Supplements: 360 kcal and 14g protein  Total intake: 934 kcal (50% of minimum estimated needs)  34g protein (36% of minimum estimated needs)  TPN decreased to half rate today.  Checked in with patient at bedside. She reports that she did not eat breakfast today. She does not generally eat breakfast at home but also mentions that she was working with nursing and missed breakfast today. She was taking medication at time of visit and endorses feeling nauseous after. Encouraged pt to consume small amount of oral intake prior to taking meds to alleviate these symptoms.   She did consume one Ensure this morning and reports also receiving Boost Plus. Discussed extensively with patient, importance of eating small, frequent meals to aid in wound healing, rehabilitation and muscle maintenance.   Suspect oral intake to remain subpar as she was not eating much PTA. (No breakfast, small snacks and dinner daily). Ideally would like to optimize patient via TPN given inadequate oral intake. Will continue calorie count one more day to assess adequacy of oral intake today.    Nutrition Dx: Inadequate oral intake related to acute illness, altered GI function (sigmoid bowel perforation, s/p exlap and resection) as evidenced by NPO status.    Goal: Patient will meet greater than or equal to 90% of their needs    Intervention:  Start calorie count to assess nutritional adequacy Continue GI Soft diet as ordered per Surgery Ensure Plus High Protein po BID, each supplement provides 350 kcal and 20 grams of protein. D/t prior report of limited intake PTA; recommend small, frequent meals via 3 meals and 3 snacks daily Discontinue Juven  Continue  MVI with minerals daily Vitamin A repletion 10,000 units daily x1 week Vitamin C repletion 250mg  BID x 30 days  [name], RDN, LDN Clinical Nutrition See AMiON for contact information.

## 2023-10-18 NOTE — Plan of Care (Signed)

## 2023-10-18 NOTE — Progress Notes (Signed)
 Inpatient Rehab Coordinator Note:  I met with patient at bedside to discuss CIR recommendations and goals/expectations of CIR stay.  We reviewed 3 hrs/day of therapy, physician follow up, and average length of stay 2 weeks (dependent upon progress) with goals of modified independent.  She states her spouse is a Agricultural consultant and she typically stays with her sister while he's absent to help care for their mom with dementia.  She is anxious to return home so she can continue to help her mom.  We discussed current barrier to CIR is largely related to TPN, while current barriers to home are TPN and assist level for sit<>stand, particularly from lower level surfaces.  We discussed we can start insurance auth process while TPN being weaned down, and then address most appropriate discharge setting when ready to discharge.  She can continue to work with therapy towards d/c home if able, and we can be ready to transition to CIR if needed.  Dr. Emeline in at end of visit.   Reche Lowers, PT, DPT Admissions Coordinator [phone] 10/18/23  11:07 AM

## 2023-10-18 NOTE — Plan of Care (Signed)

## 2023-10-19 ENCOUNTER — Other Ambulatory Visit (HOSPITAL_COMMUNITY): Payer: Self-pay | Admitting: Pharmacist

## 2023-10-19 DIAGNOSIS — K631 Perforation of intestine (nontraumatic): Secondary | ICD-10-CM | POA: Diagnosis not present

## 2023-10-19 LAB — BASIC METABOLIC PANEL WITH GFR
Anion gap: 11 (ref 5–15)
BUN: 20 mg/dL (ref 8–23)
CO2: 23 mmol/L (ref 22–32)
Calcium: 8 mg/dL — ABNORMAL LOW (ref 8.9–10.3)
Chloride: 104 mmol/L (ref 98–111)
Creatinine, Ser: 1 mg/dL (ref 0.44–1.00)
GFR, Estimated: >60 mL/min (ref >60)
Glucose, Bld: 92 mg/dL (ref 70–99)
Potassium: 3.9 mmol/L (ref 3.5–5.1)
Sodium: 138 mmol/L (ref 135–145)

## 2023-10-19 LAB — CBC
HCT: 27.9 % — ABNORMAL LOW (ref 36.0–46.0)
Hemoglobin: 8.8 g/dL — ABNORMAL LOW (ref 12.0–15.0)
MCH: 28.6 pg (ref 26.0–34.0)
MCHC: 31.5 g/dL (ref 30.0–36.0)
MCV: 90.6 fL (ref 80.0–100.0)
Platelets: 384 K/uL (ref 150–400)
RBC: 3.08 MIL/uL — ABNORMAL LOW (ref 3.87–5.11)
RDW: 14 % (ref 11.5–15.5)
WBC: 11.1 K/uL — ABNORMAL HIGH (ref 4.0–10.5)
nRBC: 0 % (ref 0.0–0.2)

## 2023-10-19 LAB — GLUCOSE, CAPILLARY
Glucose-Capillary: 114 mg/dL — ABNORMAL HIGH (ref 70–99)
Glucose-Capillary: 156 mg/dL — ABNORMAL HIGH (ref 70–99)

## 2023-10-19 LAB — MAGNESIUM: Magnesium: 1.9 mg/dL (ref 1.7–2.4)

## 2023-10-19 NOTE — Progress Notes (Addendum)
 PHARMACY - TOTAL PARENTERAL NUTRITION CONSULT NOTE   Indication: Prolonged ileus  Patient Measurements: Height: 5' 9 (175.3 cm) Weight: 122.3 kg (269 lb 10 oz) IBW/kg (Calculated) : 66.2 TPN AdjBW (KG): 79.1 Body mass index is 39.82 kg/m.   Assessment:  21 YOF admitted w/ HPI significant for left sided abdominal pain that began 7 days PTA with associated decreased oral intake, found to have perforated sigmoid colon who is s/p emergent exlap w/ resection / Hartmann's colostomy. She requires TPN for anticipated post-op ileus and prolonged inability to obtain nutrition via oral route.   Noted that TPN was unable to infuse on 10/7 due to access issues. IV team has assessed this morning and line is now functioning with good blood return.    Glucose / Insulin : HgbA1c 4.6 / BG 103-115, 10 units regular insulin  in TPN, no addl SSI, Q8H CBG checks  Electrolytes: K: 3.9 (none in TPN), CoCa 9.9, Mg 1.9, others wnl Renal: Scr 1, BUN 20 Hepatic: AST/ ALT/alk Phos/ Tbili wnl, alb 1.7, TG 86 (10/1) Intake / Output; MIVF: UOP 0.5 ml/kg/hr, drains 60 ml, colostomy 90 ml  GI Imaging: 9/28 CT abd shows sigmoid colitis w/ perf, extraluminal gas + stool extending into small bowel mesentery, extensive inflammation changes throughout lower abd + pelvic mesentery + peritoneal thickening GI Surgeries / Procedures:  10/6 CT abdomen: fluid collections concerning for post-op abscess  9/28 Exlap w/ resection and Hartmann's colostomy  Central access: 9/28 TPN start date: 9/29  Nutritional Goals: Goal TPN rate is 75 mL/hr (provides 100 g of protein and 2016 kcals per day)  RD Assessment: Estimated Needs Total Energy Estimated Needs: 1850-2050 Total Protein Estimated Needs: 95-110g Total Fluid Estimated Needs: >/=1.8L  Current Nutrition:  TPN 9/29 >> 10/3 CLD: Boost x2 given to patient 10/4 FLD: 1 Boost, some pudding 10/5 FLD: 1 vanilla ice cream, working on Boost. Ostomy leakage overnight >replaced,  abdominal irritation/cramping, especially after dairy products but was never lactose-intolerant before; Small amount of liquid in ostomy  10/5 Soft diet - calorie count started on 10/5, remains with low PO intake per discussion with RD  10/7 NPO for IR placement of drains   10/8 diet soft with calorie count, ensure x 2 on 10/8   Plan: Stop TPN after current bag completes (at [facility])  Thank you for allowing pharmacy to be a part of this patient's care.  Shelba Collier, PharmD, BCPS Clinical Pharmacist

## 2023-10-19 NOTE — Progress Notes (Signed)
 Inpatient Rehab Admissions Coordinator:  Saw pt at bedside. Insurance authorization received. Verified that pt was still interested in pursuing CIR. Plan to admit pt to CIR tomorrow. Dr. Caleen aware and in agreement. NSG can call [phone] for bed availability. Will continue to follow.   Tinnie Yvone Cohens, MS, CCC-SLP Admissions Coordinator [phone]

## 2023-10-19 NOTE — Plan of Care (Signed)

## 2023-10-19 NOTE — Progress Notes (Signed)
 Calorie Count Note  Calorie count ordered.   Diet: GI soft Supplements: Ensure Plus High Protein BID, Boost Plus BID   Estimated Nutritional Needs:  Kcal:  1850-2050 Protein:  95-110g Fluid:  >/=1.8L  Breakfast: 0 Lunch: 302 kcal and 14g protein Dinner: none documented Supplements: 700 kcal and 40g protein  Unknown if patient consumed any snacks yesterday. Pt unavailable at time of visit. Of note, she did consume breakfast this morning (~374 kcal and 12g protein)  Total intake: 1002 kcal (54% of minimum estimated needs)  54g protein (57% of minimum estimated needs)  Calorie count information not complete however it does appear pt's intake is gradually increasing on a daily basis. She is motivated to heal her incision and return home to care for her mom. Noted plan to discontinue TPN today. Continue to encourage small, frequent intake with consumption of oral nutrition supplements as tolerated to optimize nutritional adequacy.   Nutrition Dx: Inadequate oral intake related to acute illness, altered GI function (sigmoid bowel perforation, s/p exlap and resection) as evidenced by NPO status.    Goal: Patient will meet greater than or equal to 90% of their needs    Intervention:  Discontinue calorie count Continue GI Soft diet as ordered per Surgery Ensure Plus High Protein po BID, each supplement provides 350 kcal and 20 grams of protein. Boost Plus po BID, each supplement provides 360 kcal and 14 grams of protein D/t prior report of limited intake PTA; recommend small, frequent meals via 3 meals and 3 snacks daily Continue MVI with minerals daily Vitamin A repletion 10,000 units daily x3 more days Vitamin C repletion 250mg  BID x 30 days  [name], RDN, LDN Clinical Nutrition See AMiON for contact information.

## 2023-10-19 NOTE — Consult Note (Addendum)
 WOC Nurse wound follow up Vac had been turned off by staff for leaking prior to the dressing change. Ostomy pouch did not leak stool into the wound, seal appeared to be leaking to lower edges. Vac change performed.  Pt was medicated for pain prior to the procedure and tolerated with mod amt discomfort.  Abd full thickness post-op wound is beefy red with yellow adipose scattered throughout. Mod amt pink drainage. Slowly decreasing in size. 14X6X3cm, small amt bloody drainage when sponge removed.  Applied barrier ring  strip to area between wound and ostomy, since they are in close proximity and there is a significant crease, and then applied one piece black foam to cont suction.  WOC will plan to change again on Tues. Supplies at bedside for use.  IT WILL BE VERY DIFFICULT TO MAINTAIN A VAC SEAL BETWEEN VAC AND OSTOMY AT HOME, RECOMMEND MOIST GAUZE DRESSING UPON DISCHARGE INSTEAD OF VAC.    WOC Nurse ostomy follow up Colostomy pouch changed and teaching session performed.  Pouch was overfilled upon entry; educated Pt to call the nurse for assistance with emptying and she will need to empty when half full at home.  Pt has not practiced emptying yet; she will need total assistance after discharge unless she obtains this skill.   Colostomy was very active and had large amt output several times during the change, cleaned and applied pouching system.   No stool went into the wound. Pt watched with a hand held mirror and assisted.  She was able to stretch the barrier ring and apply to the pouch, and was able to open and close to empty. Stoma is located in a deep crease located at 3:00 o'clock and 9:00 o'clock. It is red and flush with skin level, 2 inches and oval.  200 cc brown semi-liquid stool Applied barrier ring and 1 piece flexible convex pouching system.  Applied belt to help hold the pouch in place over the wound and encouraged patient to get out of bed today. Ordered 5 sets of flexible  convex one piece pouches to the room, as well as 10 barrier rings.  Use supplies: Order Supplies: 1pc soft convex [id number], barrier ring [id number], belt #[id number] Reviewed pouching routines and ordering supplies.  Enrolled patient in [place] Secure Start Discharge program: Yes, previously WOC team will perform another pouch change and teaching session on Tues, along with the Vac change since both are located in close proximity and unable to be sperated very successfully.    Pt could benefit from referral to the outpatient ostomy clinic after discharge.  Thank-you  Stephane Fought MSN, RN, CWOCN, CWCN-AP, CNS Contact Mon-Fri 0700-1500: [phone]      Revision History

## 2023-10-19 NOTE — Progress Notes (Signed)
 PROGRESS NOTE    [name]  FMW:[id number] DOB: [birth year] DOA: 10/07/2023 PCP: Lynwood Laneta ORN, PA-C    Brief Narrative:   63 year old female with past medical history significant for ANCA vasculitis, chronic immunosuppression, type 2 diabetes, hypertension, hyperlipidemia, ILD and previous sigmoid colon perforation who presented to the ER via EMS with left-sided abdominal pain that began 7 days prior to admission.  On arrival patient was hypothermic and admitted with fever, tachypnea, tachycardic and hypotensive.  Workup revealed mild sigmoid colitis with perforation.  Patient admitted to ICU for septic shock.  She underwent exploratory laparotomy on 9/28, sigmoid bowel perforation with feculent peritonitis noted.  She was transferred to [facility] on 10/4.  She remains on TPN.  Has been requiring frequent pain medications and is noted to have softer blood pressures but blood pressure seems to have improved in the past several days, remains on midodrine.  Slowly she has regained her bowel function.  CT 10/6 shows multiple abscess in the abdomen, plan for IR drainage today.   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  Has wound VAC to abdominal wound.  Tolerating p.o. better, will plan on weaning off TPN today -Repeat CT abdomen/pelvis 10/6 showed multiple intraperitoneal abscess requiring drain placement by IR on 10/7. Contact IR prior to dc for follow up care arrangement -Antibiotics per general surgery, currently on Zosyn  -Will continue to wean down midodrine as able.    ANCA associated vasculitis/ILD  on methotrexate /CellCept  at baseline, currently on hold due to infection  continue incruse   Diabetes mellitus type 2  Hemoglobin A1c is 4.6 on 10/07/2023.  Blood sugars reading are in acceptable range.   NICM   On Coreg , Entresto  PTA.  Will continue to hold at this time  due to hypotension issues.  Resume gradually as appropriate.   Borderline hyperkalemia  Will recheck labs intermittently.   Anxiety  Continue alprazolam , bupropion , fluoxetine   Physical deconditioning: PT OT following. Possible CIR.    DVT prophylaxis: SQ Heparin     Code Status: Full Code Family Communication:   Status is: Inpatient Remains inpatient appropriate because: Plans for CIR   PT Follow up Recs: Acute Inpatient Rehab (3hours/Day)10/17/2023 1646  Subjective: Feeling well no complaints.  Tolerating p.o. better   Examination:  General exam: Appears calm and comfortable  Respiratory system: Clear to auscultation. Respiratory effort normal. Cardiovascular system: S1 & S2 heard, RRR. No JVD, murmurs, rubs, gallops or clicks. No pedal edema. Gastrointestinal system: Abdomen is nondistended, soft and nontender. No organomegaly or masses felt. Normal bowel sounds heard. Central nervous system: Alert and oriented. No focal neurological deficits. Extremities: Symmetric 5 x 5 power. Skin: Surgical wound in the abdomen noted, left lower quadrant drain with sanguinous fluid.  Ostomy in place Psychiatry: Judgement and insight appear normal. Mood & affect appropriate. Right upper extremity PICC line in place               Diet Orders (From admission, onward)     Start     Ordered   10/16/23 1549  DIET SOFT Room service appropriate? Yes; Fluid consistency: Thin  Diet effective now       Question Answer Comment  Room service appropriate? Yes   Fluid consistency: Thin      10/16/23 1548            Objective: Vitals:   10/18/23 1948 10/19/23 0737 10/19/23 0831 10/19/23  1244  BP: (!) 104/57 (!) 103/50  127/79  Pulse: 80 75  80  Resp: 14 16  17   Temp: 98.9 F (37.2 C) 97.6 F (36.4 C)  98.5 F (36.9 C)  TempSrc: Oral Oral  Oral  SpO2: 94% 98% 98% 95%  Weight:      Height:        Intake/Output Summary (Last 24 hours) at 10/19/2023 1246 Last data  filed at 10/19/2023 1015 Gross per 24 hour  Intake 1206.02 ml  Output 2950 ml  Net -1743.98 ml   Filed Weights   10/16/23 0702 10/17/23 0500 10/18/23 0500  Weight: 121.6 kg 122.3 kg 122.3 kg    Scheduled Meds:  acetaminophen   1,000 mg Oral QID   ascorbic acid  250 mg Oral BID   buPROPion   450 mg Oral Daily   Chlorhexidine  Gluconate Cloth  6 each Topical Daily   docusate sodium   100 mg Oral BID   feeding supplement  237 mL Oral BID BM   FLUoxetine  40 mg Oral Daily   heparin  injection (subcutaneous)  5,000 Units Subcutaneous Q8H   lactose free nutrition  237 mL Oral BID BM   lidocaine   2 patch Transdermal Q24H   methocarbamol  1,000 mg Oral QID   midodrine  10 mg Oral TID WC   multivitamin with minerals  1 tablet Oral Daily   mupirocin ointment   Nasal BID   ondansetron  (ZOFRAN ) IV  4 mg Intravenous Q6H   oxyCODONE   5 mg Oral Once   polyethylene glycol  17 g Oral Daily   pregabalin  50 mg Oral BID   sodium chloride  flush  10-40 mL Intracatheter Q12H   umeclidinium bromide  1 puff Inhalation Daily   vitamin A  10,000 Units Oral Daily   Continuous Infusions:  micafungin (MYCAMINE) 150 mg in sodium chloride  0.9 % 100 mL IVPB 150 mg (10/18/23 2229)   piperacillin -tazobactam 3.375 g (10/19/23 0632)   TPN ADULT (ION) 35 mL/hr at 10/18/23 1754    Nutritional status Signs/Symptoms: NPO status Interventions: TPN Body mass index is 39.82 kg/m.  Data Reviewed:   CBC: Recent Labs  Lab 10/15/23 1026 10/17/23 0610 10/18/23 0320 10/19/23 0327  WBC 13.7* 14.0* 11.6* 11.1*  NEUTROABS  --  10.8*  --   --   HGB 9.7* 8.8* 8.9* 8.8*  HCT 31.4* 27.9* 28.2* 27.9*  MCV 90.2 89.4 89.8 90.6  PLT 304 335 360 384   Basic Metabolic Panel: Recent Labs  Lab 10/13/23 0617 10/14/23 0500 10/15/23 1026 10/18/23 0320 10/19/23 0327  NA 139 137 136 136 138  K 5.2* 5.0 4.2 4.0 3.9  CL 108 104 105 103 104  CO2 24 23 25 24 23   GLUCOSE 88 112* 127* 115* 92  BUN 32* 26* 23 23 20    CREATININE 0.99 0.96 0.95 1.10* 1.00  CALCIUM  7.9* 8.1* 8.1* 8.1* 8.0*  MG 1.8 1.9 1.9 2.1 1.9  PHOS 4.1 3.9 3.4 4.2  --    GFR: Estimated Creatinine Clearance: 81.6 mL/min (by C-G formula based on SCr of 1 mg/dL). Liver Function Tests: Recent Labs  Lab 10/15/23 1026 10/18/23 0320  AST 15 16  ALT 10 13  ALKPHOS 85 118  BILITOT 0.6 0.5  PROT 6.3* 5.8*  ALBUMIN  1.7* 1.7*   No results for input(s): LIPASE, AMYLASE in the last 168 hours. No results for input(s): AMMONIA in the last 168 hours. Coagulation Profile: Recent Labs  Lab 10/16/23 1149  INR 1.2  Cardiac Enzymes: No results for input(s): CKTOTAL, CKMB, CKMBINDEX, TROPONINI in the last 168 hours. BNP (last 3 results) No results for input(s): PROBNP in the last 8760 hours. HbA1C: No results for input(s): HGBA1C in the last 72 hours. CBG: Recent Labs  Lab 10/17/23 1030 10/18/23 0026 10/18/23 0614 10/18/23 1358 10/19/23 0648  GLUCAP 105* 122* 103* 118* 114*   Lipid Profile: No results for input(s): CHOL, HDL, LDLCALC, TRIG, CHOLHDL, LDLDIRECT in the last 72 hours. Thyroid Function Tests: No results for input(s): TSH, T4TOTAL, FREET4, T3FREE, THYROIDAB in the last 72 hours. Anemia Panel: No results for input(s): VITAMINB12, FOLATE, FERRITIN, TIBC, IRON, RETICCTPCT in the last 72 hours. Sepsis Labs: No results for input(s): PROCALCITON, LATICACIDVEN in the last 168 hours.  Recent Results (from the past 240 hours)  Aerobic/Anaerobic Culture w Gram Stain (surgical/deep wound)     Status: None (Preliminary result)   Collection Time: 10/16/23  3:09 PM   Specimen: Abdomen; Body Fluid  Result Value Ref Range Status   Specimen Description ABDOMEN FLUID  Final   Special Requests NONE  Final   Gram Stain   Final    RARE WBC PRESENT, PREDOMINANTLY PMN NO ORGANISMS SEEN    Culture   Final    NO GROWTH 3 DAYS NO ANAEROBES ISOLATED; CULTURE IN PROGRESS FOR 5  DAYS Performed at [facility], [address], [place], [place] [address]    Report Status PENDING  Incomplete         Radiology Studies: No results found.         LOS: 12 days   Time spent= 35 mins    Burgess JAYSON Dare, MD [facility]  If 7PM-7AM, please contact night-coverage  10/19/2023, 12:46 PM

## 2023-10-19 NOTE — Progress Notes (Addendum)
 Patient ID: [name], female   DOB: 1960/04/30, 63 y.o.   MRN: [id number]    Referring Physician(s): E. Simaan PA   Supervising Physician: Jenna Hacker  Patient Status:  [facility] - In-pt  Chief Complaint:  Intra abdominal abscess; s/p LLQ abdominal drain placement 10/16/23   Subjective:  Pt continues to do well. Eating and tolerating po intake. Still with some mild LLQ soreness, although feels slightly improved. 10ml out over last night from drain. Pt tells me she may be getting transferred to rehab unit.   Allergies: Mucinex [guaifenesin er], Sulfa antibiotics, and Glucophage [metformin]  Medications: Prior to Admission medications   Medication Sig Start Date End Date Taking? Authorizing Provider  ALPRAZolam  (XANAX ) 1 MG tablet Take 1 tablet (1 mg total) by mouth 3 (three) times daily as needed for anxiety. Patient taking differently: Take 0.5-1.5 mg by mouth See admin instructions. Take 1-1.5 tablets (1-1.5mg ) by mouth every night at bedtime and take 0.5-1 tablets (0.5-1mg ) twice daily as needed for anxiety. 06/30/19  Yes Odell Celinda Balo, MD  buPROPion  (WELLBUTRIN  XL) 150 MG 24 hr tablet Take 450 mg by mouth daily.   Yes [provider]  carvedilol  (COREG ) 12.5 MG tablet Take 1 tablet (12.5 mg total) by mouth 2 (two) times daily. 07/10/23 11/17/23 Yes Wyn Jackee VEAR Mickey., NP  ergocalciferol (VITAMIN D2) 1.25 MG (50000 UT) capsule Take 50,000 Units by mouth every Sunday.   Yes [provider]  FLUoxetine (PROZAC) 40 MG capsule Take 40 mg by mouth daily.   Yes [provider]  folic acid  (FOLVITE ) 1 MG tablet Take 1 mg by mouth daily.    Yes [provider]  methotrexate  50 MG/2ML injection Inject 15 mg into the vein every Sunday.   Yes [provider]  mycophenolate  (CELLCEPT ) 500 MG tablet Take 3 tablets (1,500 mg total) by mouth 2 (two) times daily. 07/14/19  Yes Odell Celinda Balo, MD  nitroGLYCERIN  (NITROSTAT ) 0.4 MG SL tablet  Place 1 tablet (0.4 mg total) under the tongue every 5 (five) minutes as needed for chest pain. 09/22/21 11/17/23 Yes Wyn Jackee VEAR Mickey., NP  polyethylene glycol (MIRALAX  / GLYCOLAX ) packet Take 17 g by mouth 2 (two) times daily as needed (constiaption).   Yes [provider]  sacubitril -valsartan  (ENTRESTO ) 49-51 MG Take 1 tablet by mouth 2 (two) times daily. 08/23/23  Yes Nishan, Peter C, MD  Semaglutide, 2 MG/DOSE, (OZEMPIC, 2 MG/DOSE,) 8 MG/3ML SOPN Inject 2 mg into the skin every Sunday.   Yes [provider]  Tiotropium Bromide Monohydrate  (SPIRIVA  RESPIMAT) 1.25 MCG/ACT AERS Inhale 2 puffs into the lungs daily. Patient taking differently: Inhale 2 puffs into the lungs daily as needed (wheezing shortness of breath). 03/12/23  Yes Geronimo Amel, MD  pregabalin (LYRICA) 100 MG capsule Take 100 mg by mouth 2 (two) times daily. Patient not taking: Reported on 10/09/2023    [provider]     Vital Signs: BP 127/79 (BP Location: Left Arm)   Pulse 80   Temp 98.5 F (36.9 C) (Oral)   Resp 17   Ht 5' 9 (1.753 m)   Wt 269 lb 10 oz (122.3 kg)   SpO2 95%   BMI 39.82 kg/m   Physical Exam Vitals and nursing note reviewed.  Constitutional:      Appearance: Normal appearance.  Cardiovascular:     Rate and Rhythm: Normal rate.  Pulmonary:     Effort: Pulmonary effort is normal.  Abdominal:  Palpations: Abdomen is soft.     Tenderness: There is no abdominal tenderness.     Comments: + LLQ drain. Minimal surrounding tenderness. Flush easily, difficulty aspirate. Dressed appropriately  Skin:    General: Skin is warm and dry.  Neurological:     Mental Status: She is alert and oriented to person, place, and time. Mental status is at baseline.     Imaging: CT GUIDED SOFT TISSUE FLUID DRAIN BY PERC CATH Result Date: 10/16/2023 INDICATION: 63 year old with history of sigmoid bowel perforation with peritonitis. Status post sigmoid colon resection and colostomy.  Patient has postoperative fluid collections. Patient presents for CT-guided drain placement. EXAM: CT-GUIDED PLACEMENT OF DRAINAGE CATHETER IN LEFT LOWER ABDOMINAL FLUID COLLECTION TECHNIQUE: Multidetector CT imaging of the abdomen and pelvis was performed following the standard protocol without IV contrast. RADIATION DOSE REDUCTION: This exam was performed according to the departmental dose-optimization program which includes automated exposure control, adjustment of the mA and/or kV according to patient size and/or use of iterative reconstruction technique. MEDICATIONS: Moderate sedation ANESTHESIA/SEDATION: Moderate (conscious) sedation was employed during this procedure. A total of Versed  1 mg and Fentanyl  75 mcg was administered intravenously by the radiology nurse. Total intra-service moderate Sedation Time: 32 minutes. The patient's level of consciousness and vital signs were monitored continuously by radiology nursing throughout the procedure under my direct supervision. COMPLICATIONS: None immediate. PROCEDURE: Informed written consent was obtained from the patient after a thorough discussion of the procedural risks, benefits and alternatives. All questions were addressed. Maximal Sterile Barrier Technique was utilized including caps, mask, sterile gowns, sterile gloves, sterile drape, hand hygiene and skin antiseptic. A timeout was performed prior to the initiation of the procedure. Patient was placed supine. CT images through the lower abdomen and pelvis were obtained. The fluid collection in the anterior left lower quadrant was identified and targeted. Left lower abdomen was prepped with chlorhexidine  and sterile field was created. Skin was anesthetized using 1% lidocaine . Small incision was made. Using CT guidance, an 18 gauge trocar needle was directed into the left lower quadrant fluid collection. Amber colored fluid was aspirated. Superstiff Amplatz wire was advanced into the collection. Tract was  dilated to accommodate a 10 [place] multipurpose drain. Fluid was collected and sent for culture. Drain was sutured to skin and attached to a suction bulb. Bandage was placed. FINDINGS: 24 French drain placed within the anterior left lower quadrant collection. Majority of the fluid was aspirated. Amber colored fluid was aspirated from the drain. IMPRESSION: CT-guided placement of drainage catheter in the anterior left lower abdominal fluid collection. Electronically Signed   By: Juliene Balder M.D.   On: 10/16/2023 20:17   CT ABDOMEN PELVIS W CONTRAST Result Date: 10/15/2023 EXAM: CT ABDOMEN AND PELVIS WITH CONTRAST 10/15/2023 09:38:00 PM TECHNIQUE: CT of the abdomen and pelvis was performed with the administration of intravenous contrast, 75mL (iohexol  (OMNIPAQUE ) 350 MG/ML injection 75 mL IOHEXOL  350 MG/ML SOLN). Multiplanar reformatted images are provided for review. Automated exposure control, iterative reconstruction, and/or weight-based adjustment of the mA/kV was utilized to reduce the radiation dose to as low as reasonably achievable. COMPARISON: Comparison is made to October 07, 2023. CLINICAL HISTORY: Abdominal pain, post-operative, with anticipation of post-operative abscess. FINDINGS: LOWER CHEST: Small bilateral pleural effusions are present. Trace pericardial effusion. Cardiac size within normal limits. LIVER: The liver is unremarkable. Cholelithiasis is present without superimposed pericholecystic inflammatory change. No intra or extrahepatic biliary ductal dilation. GALLBLADDER AND BILE DUCTS: Cholelithiasis is present without superimposed pericholecystic inflammatory change.  No biliary ductal dilatation. SPLEEN: No acute abnormality. PANCREAS: No acute abnormality. ADRENAL GLANDS: No acute abnormality. KIDNEYS, URETERS AND BLADDER: No stones in the kidneys or ureters. No hydronephrosis. No perinephric or periureteral stranding. Urinary bladder contains gas, which is nonspecific and may relate to  recent catheterization, though aggressive infection could appear similarly. GI AND BOWEL: Interval changes of sigmoid colectomy, left midabdominal descending colostomy, and Hartmann patch formation are identified. The stomach, small bowel, and colon are otherwise unremarkable, and there is no evidence of obstruction. Punctate free intraperitoneal gas is noted within the omentum and right subdiaphragmatic region. Mesenteric edema and peritoneal enhancement are nonspecific and may represent postsurgical change and/or peritoneal inflammation. Multiple loculated rim-enhancing intraperitoneal fluid collections are identified, measuring 2.9 x 2.7 cm at image 49, series 3, and 2.2 x 3.8 cm at image 57, series 3, with growth within the deep small bowel mesentery. Within the pelvis, 3 separate fluid collections were identified, all seen on image 72, series 3, measuring 4.5 x 7.4 cm, 3.4 x 4.5 cm, and 4.3 x 7.5 cm. The appendix is not clearly identified and may be absent. PERITONEUM AND RETROPERITONEUM: There are multiple loculated rim-enhancing intraperitoneal fluid collections and punctate free intraperitoneal gas. Mesenteric edema and peritoneal enhancement are present. VASCULATURE: Aorta is normal in caliber. LYMPH NODES: No lymphadenopathy. REPRODUCTIVE ORGANS: No acute abnormality. BONES AND SOFT TISSUES: Osseous structures are age-appropriate. No acute bone abnormality. No lytic or blastic bone lesion. Superficial dehiscence of the midline laparotomy incision is noted. There is moderate subcutaneous edema within the flanks bilaterally. IMPRESSION: 1. Multiple loculated rim-enhancing intraperitoneal fluid collections, concerning for post-operative abscesses. 2. Punctate free intraperitoneal gas, mesenteric edema, and peritoneal enhancement, possibly representing postsurgical change and/or peritoneal inflammation. 3. Anasarca with body wall edema, Small bilateral pleural effusions and trace pericardial effusion. 4. Gas  within the bladder lumen, nonspecific. Correlation with urinalysis may be helpful for further management. Electronically signed by: Dorethia Molt MD 10/15/2023 10:51 PM EDT RP Workstation: [id number]    Labs:  CBC: Recent Labs    10/15/23 1026 10/17/23 0610 10/18/23 0320 10/19/23 0327  WBC 13.7* 14.0* 11.6* 11.1*  HGB 9.7* 8.8* 8.9* 8.8*  HCT 31.4* 27.9* 28.2* 27.9*  PLT 304 335 360 384    COAGS: Recent Labs    10/16/23 1149  INR 1.2    BMP: Recent Labs    10/14/23 0500 10/15/23 1026 10/18/23 0320 10/19/23 0327  NA 137 136 136 138  K 5.0 4.2 4.0 3.9  CL 104 105 103 104  CO2 23 25 24 23   GLUCOSE 112* 127* 115* 92  BUN 26* 23 23 20   CALCIUM  8.1* 8.1* 8.1* 8.0*  CREATININE 0.96 0.95 1.10* 1.00  GFRNONAA >60 >60 57* >60    LIVER FUNCTION TESTS: Recent Labs    10/10/23 0445 10/11/23 1330 10/12/23 0345 10/15/23 1026 10/18/23 0320  BILITOT 0.8 0.4  --  0.6 0.5  AST 14* 18  --  15 16  ALT 9 11  --  10 13  ALKPHOS 62 63  --  85 118  PROT 6.1* 6.0*  --  6.3* 5.8*  ALBUMIN  2.1* 2.0* 1.6* 1.7* 1.7*    Assessment and Plan:  Drain Location: LLQ Size: Fr size: 10 Fr Date of placement: 10/16/23  Currently to: Drain collection device: suction bulb 24 hour output:  Output by Drain (mL) 10/17/23 0701 - 10/17/23 1900 10/17/23 1901 - 10/18/23 0700 10/18/23 0701 - 10/18/23 1900 10/18/23 1901 - 10/19/23 0700 10/19/23 0701 - 10/19/23  1454  Closed System Drain Left;Anterior Abdomen Bulb (JP) 10 Fr.  30 10  0  Negative Pressure Wound Therapy Abdomen Medial  50 50  100    Interval imaging/drain manipulation:  N/A  Current examination: Flushes/aspirates easily.  Insertion site unremarkable. Suture and stat lock in place. Dressed appropriately.   Plan: Continue TID flushes with 5 cc NS. Record output Q shift. Dressing changes QD or PRN if soiled.  Call IR APP or on call IR MD if difficulty flushing or sudden change in drain output.  Repeat imaging/possible drain  injection once output < 10 mL/QD (excluding flush material). Consideration for drain removal if output is < 10 mL/QD (excluding flush material), pending discussion with the providing surgical service.  Discharge planning: Please contact IR APP or on call IR MD prior to patient d/c to ensure appropriate follow up plans are in place. Typically patient will follow up with IR clinic 10-14 days post d/c for repeat imaging/possible drain injection. IR scheduler will contact patient with date/time of appointment. Patient will need to flush drain QD with 5 cc NS, record output QD, dressing changes every 2-3 days or earlier if soiled.   IR will continue to follow - please call with questions or concerns.     Electronically Signed: Kimble VEAR Clas, PA-C 10/19/2023, 2:52 PM   I spent a total of 15 Minutes at the the patient's bedside AND on the patient's hospital floor or unit, greater than 50% of which was counseling/coordinating care for abdominal abscess drain follow up.

## 2023-10-19 NOTE — TOC Progression Note (Signed)
 Transition of Care (TOC) - Progression Note  Rayfield Gobble RN, BSN Inpatient Care Management Unit 4E- RN Case Manager See Treatment Team for direct phone #   Patient Details  Name: [name] MRN: [id number] Date of Birth: December 26, 1960  Transition of Care [facility]) CM/SW Contact  Gobble, Rayfield Hurst, RN Phone Number: 10/19/2023, 3:35 PM  Clinical Narrative:    Noted CIR has insurance auth and will possibly admit pt over the weekend.   [facility] liaison updated and will follow up on final DME needs prior to pt going home from CIR.   [facility] liaison also updated on plan for CIR and will follow for [facility] needs.    Expected Discharge Plan: IP Rehab Facility Barriers to Discharge: Insurance Authorization               Expected Discharge Plan and Services   Discharge Planning Services: CM Consult Post Acute Care Choice: Durable Medical Equipment, Home Health, IP Rehab Living arrangements for the past 2 months: Single Family Home                 DME Arranged: Bedside commode, Walker rolling DME Agency: [facility] Date DME Agency Contacted: 10/10/23 Time DME Agency Contacted: 1435 Representative spoke with at DME Agency: [facility] HH Arranged: PT, OT, RN, Nurse's Aide HH Agency: [facility] Date [facility] Agency Contacted: 10/10/23 Time HH Agency Contacted: 1420 Representative spoke with at [facility] Agency: Amy   Social Drivers of Health (SDOH) Interventions SDOH Screenings   Food Insecurity: No Food Insecurity (10/08/2023)  Housing: Low Risk  (10/08/2023)  Transportation Needs: No Transportation Needs (10/08/2023)  Utilities: Not At Risk (10/08/2023)  Depression (PHQ2-9): Low Risk  (09/03/2019)  Social Connections: Moderately Integrated (10/08/2023)  Tobacco Use: Medium Risk (10/07/2023)    Readmission Risk Interventions    10/08/2023    4:30 PM  Readmission Risk Prevention Plan  Post Dischage Appt Complete  Medication Screening Complete  Transportation Screening Complete

## 2023-10-19 NOTE — H&P (Shared)
 Physical Medicine and Rehabilitation Admission H&P    Chief Complaint  Patient presents with   Functional deficits due to debility    HPI: [name] is a 63 year old female with history of ANCA associated vasculitis, anxiety, depression T2DM, chronic fatigue, colitis, DPH, HTN, LBP with sciatica, ILD, microscopic polyangiitis, paresthesias, stercoral perforation of sigmoid colon who was admitted on 10/28 with 3 week history of constipation. She presented to ED with abdominal pain,  2 day history of decrease in UOP and signs of early sepsi. She was found to have sigmoid colitis with bowel perforation with peritonitis. She was taken to OR emergently for Exp Lap with sigmoid resection and Hartman's colostomy 9/28 by Dr. Rubin.  Post op placed on broad spectrum antibiotics, pressors and had issues with pain control as well as anxiety. Post op started on TNA for nutritional  support due to ileus, had issues with pain control as well as delirium.   Follow up CT A/P 10/6 showed multiple pelvic abscess and Dr. Wendolyn consulted for drain placement on 10/7. Fluid  cultures done negative so far and surgery recommends Zosyn  and Micafungin to continue for 2 week course and white count improving. She continues on midodrine for BP support. PT/OT has been working with patient who is limited by pain, weakness and requires multiple rest breaks with minimal activity. 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 CIR recommended due to functional decline.    ROS   Past Medical History:  Diagnosis Date   Acute blood loss anemia 06/23/2013   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HCC)    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HCC) 06/24/2013   Anxiety associated with depression 09/23/2012   At high risk for falls 09/23/2012   She has had a fractured ankle and tendon tear with falls over the last couple years.   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   Diverticulitis of large intestine with perforation 09/23/2012   Dyspnea 05/11/2014   GERD (gastroesophageal reflux disease)    Hemoptysis 06/23/2013   History of endometriosis 1997   Hx of tobacco use, presenting hazards to health 09/23/2012   Hyperglycemia 07/24/2013   Hypertension    Hypokalemia 07/20/2013   ILD (interstitial lung disease) (HCC) 01/22/2014   Leukocytosis 05/11/2014   MPA (microscopic polyangiitis) (HCC) 2015   Numbness    Obesity (BMI 30-39.9) 09/23/2012   Paresthesia 12/26/2016   Perforation of sigmoid colon - stercoral 09/27/2012   Physical deconditioning 04/30/2015   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   Respiratory failure with hypoxia (HCC) 06/23/2013   Right leg swelling 12/26/2016   Past Surgical History:  Procedure Laterality Date   APPENDECTOMY     BLADDER REPAIR     BOWEL RESECTION N/A 10/07/2023   Procedure: EXCISION, SMALL INTESTINE;  Surgeon: Rubin Calamity, MD;  Location: [facility] OR;  Service: General;  Laterality: N/A;   DILATATION & CURETTAGE/HYSTEROSCOPY WITH MYOSURE N/A 06/03/2019   Procedure: DILATATION & CURETTAGE/HYSTEROSCOPY WITH MYOSURE;  Surgeon: Curlene Agent, MD;  Location: [facility] OR;  Service: Gynecology;  Laterality: N/A;   IR CATHETER TUBE CHANGE  06/24/2019   IR FLUORO GUIDE CV LINE RIGHT  06/16/2019   IR RADIOLOGIST EVAL & MGMT  07/29/2019   IR US  GUIDE VASC ACCESS RIGHT  06/16/2019   LAPAROSCOPY  1997   dx of endometriosis   LAPAROSCOPY  04/28/2000    Laparoscopy with lysis of adhesions, hysteroscopy, D&C.   LAPAROTOMY N/A 10/07/2023   Procedure: LAPAROTOMY, EXPLORATORY;  Surgeon: Rubin Calamity, MD;  Location: [facility] OR;  Service: General;  Laterality: N/A;   TONSILLECTOMY      Family History  Problem Relation Age of Onset   Diabetes Mother        AODM   Diabetes Father         AODM   Hypertension Father    Obesity Father    Breast cancer Paternal Grandmother    Birth defects Cousin    CAD Other        [facility]    Social History:  reports that she quit smoking about 22 years ago. Her smoking use included cigarettes. She started smoking about 49 years ago. She has a 27 pack-year smoking history. She has never used smokeless tobacco. She reports that she does not drink alcohol and does not use drugs.    Allergies  Allergen Reactions   Mucinex [Guaifenesin Er] Nausea And Vomiting   Sulfa Antibiotics Nausea And Vomiting   Glucophage [Metformin] Nausea Only    Medications Prior to Admission  Medication Sig Dispense Refill   ALPRAZolam  (XANAX ) 1 MG tablet Take 1 tablet (1 mg total) by mouth 3 (three) times daily as needed for anxiety. (Patient taking differently: Take 0.5-1.5 mg by mouth See admin instructions. Take 1-1.5 tablets (1-1.5mg ) by mouth every night at bedtime and take 0.5-1 tablets (0.5-1mg ) twice daily as needed for anxiety.) 5 tablet 0   buPROPion  (WELLBUTRIN  XL) 150 MG 24 hr tablet Take 450 mg by mouth daily.     carvedilol  (COREG ) 12.5 MG tablet Take 1 tablet (12.5 mg total) by mouth 2 (two) times daily. 180 tablet 3   ergocalciferol (VITAMIN D2) 1.25 MG (50000 UT) capsule Take 50,000 Units by mouth every Sunday.     FLUoxetine (PROZAC) 40 MG capsule Take 40 mg by mouth daily.     folic acid  (FOLVITE ) 1 MG tablet Take 1 mg by mouth daily.      methotrexate  50 MG/2ML injection Inject 15 mg into the vein every Sunday.     mycophenolate  (CELLCEPT ) 500 MG tablet Take 3 tablets (1,500 mg total) by mouth 2 (two) times daily.     nitroGLYCERIN  (NITROSTAT ) 0.4 MG SL tablet Place 1 tablet (0.4 mg total) under the tongue every 5 (five) minutes as needed for chest pain. 90 tablet 3   polyethylene glycol (MIRALAX  / GLYCOLAX ) packet Take 17 g by mouth 2 (two) times daily as needed (constiaption).     sacubitril -valsartan  (ENTRESTO ) 49-51 MG Take 1 tablet by mouth 2  (two) times daily. 180 tablet 3   Semaglutide, 2 MG/DOSE, (OZEMPIC, 2 MG/DOSE,) 8 MG/3ML SOPN Inject 2 mg into the skin every Sunday.     Tiotropium Bromide Monohydrate  (SPIRIVA  RESPIMAT) 1.25 MCG/ACT AERS Inhale 2 puffs into the lungs daily. (Patient taking differently: Inhale 2 puffs into the lungs daily as needed (wheezing shortness of breath).) 12 g 3   pregabalin (LYRICA) 100 MG capsule Take 100 mg by mouth 2 (two) times daily. (Patient not taking: Reported on 10/09/2023)       Home: Home Living Family/patient expects to be discharged to:: Private residence Living Arrangements: Other (Comment) (going to stay at her sisters house) Available Help at Discharge: Family, Available 24 hours/day (sister) Type of Home: Mobile home Home Access: Level entry Home Layout: One level  Bathroom Shower/Tub: Associate Professor: Yes Home Equipment: Tub bench, Grab bars - tub/shower, Hand held shower head   Functional History: Prior Function Prior Level of Function : Independent/Modified Independent Mobility Comments: independent, driving ADLs Comments: independent taking care of her mother with dementia, does not cook  Functional Status:  Mobility: Bed Mobility Overal bed mobility: Needs Assistance Bed Mobility: Rolling, Sidelying to Sit Rolling: Supervision Sidelying to sit: Min assist Supine to sit: Mod assist, HOB elevated, Used rails Sit to supine: +2 for safety/equipment, HOB elevated, Used rails, Contact guard assist General bed mobility comments: bed rail removed prior to transfer as pt states she does not have a bed rail at home, HOB lowered to <30 deg prior to performing; HHA for trunk lift assist during log roll to R EOB. Transfers Overall transfer level: Needs assistance Equipment used: Rolling walker (2 wheels) (bariatric) Transfers: Sit to/from Stand Sit to Stand: From elevated surface, Contact guard assist Bed to/from  chair/wheelchair/BSC transfer type:: Step pivot Step pivot transfers: Max assist, +2 physical assistance, +2 safety/equipment General transfer comment: EOB> bari RW, bari BSC<>RW and bari RW>chair, cues for UE placement and safety Ambulation/Gait Ambulation/Gait assistance: Contact guard assist Gait Distance (Feet): 80 Feet Assistive device: Rolling walker (2 wheels) (bariatric) Gait Pattern/deviations: Step-through pattern, Decreased step length - right, Decreased step length - left, Trunk flexed, Wide base of support General Gait Details: 3 laps in room to door and back, then to chair near window, pt needing cues for activity pacing, posture, breathing through discomfort, RW proximity/mgmt and line assist. No buckling or overt LOB, very slow pace wtih multiple brief standing breaks to rest. Gait velocity: slowed Gait velocity interpretation: <1.31 ft/sec, indicative of household ambulator    ADL: ADL Overall ADL's : Needs assistance/impaired Grooming: Set up, Sitting Upper Body Dressing : Set up, Sitting Lower Body Dressing: Maximal assistance, Sit to/from stand Toilet Transfer: Minimal assistance, +2 for safety/equipment, Rolling walker (2 wheels), Maximal assistance, +2 for physical assistance, Regular Toilet, Ambulation, Grab bars Toilet Transfer Details (indicate cue type and reason): Min assist from bed height, max +2 from low toilet heavy reliance on GB Toileting- Clothing Manipulation and Hygiene: Supervision/safety, Sitting/lateral lean Functional mobility during ADLs: Minimal assistance, +2 for safety/equipment General ADL Comments: Decreased strength when standing from low height, poor activity tolerance  Cognition: Cognition Orientation Level: Oriented X4 Cognition Arousal: Alert Behavior During Therapy: WFL for tasks assessed/performed, Anxious  Physical Exam: Blood pressure 127/79, pulse 80, temperature 98.5 F (36.9 C), temperature source Oral, resp. rate 17, height  5' 9 (1.753 m), weight 122.3 kg, SpO2 95%. Physical Exam  Results for orders placed or performed during the hospital encounter of 10/07/23 (from the past 48 hours)  Glucose, capillary     Status: Abnormal   Collection Time: 10/18/23 12:26 AM  Result Value Ref Range   Glucose-Capillary 122 (H) 70 - 99 mg/dL    Comment: Glucose reference range applies only to samples taken after fasting for at least 8 hours.  Comprehensive metabolic panel     Status: Abnormal   Collection Time: 10/18/23  3:20 AM  Result Value Ref Range   Sodium 136 135 - 145 mmol/L   Potassium 4.0 3.5 - 5.1 mmol/L   Chloride 103 98 - 111 mmol/L   CO2 24 22 - 32 mmol/L   Glucose, Bld 115 (H) 70 - 99 mg/dL    Comment: Glucose reference range applies only to samples taken after fasting for at least 8  hours.   BUN 23 8 - 23 mg/dL   Creatinine, Ser 8.89 (H) 0.44 - 1.00 mg/dL   Calcium  8.1 (L) 8.9 - 10.3 mg/dL   Total Protein 5.8 (L) 6.5 - 8.1 g/dL   Albumin  1.7 (L) 3.5 - 5.0 g/dL   AST 16 15 - 41 U/L   ALT 13 0 - 44 U/L   Alkaline Phosphatase 118 38 - 126 U/L   Total Bilirubin 0.5 0.0 - 1.2 mg/dL   GFR, Estimated 57 (L) >60 mL/min    Comment: (NOTE) Calculated using the CKD-EPI Creatinine Equation (2021)    Anion gap 9 5 - 15    Comment: Performed at [facility], [address], [place], [place] [address]  Magnesium      Status: None   Collection Time: 10/18/23  3:20 AM  Result Value Ref Range   Magnesium  2.1 1.7 - 2.4 mg/dL    Comment: Performed at [facility], [address], [place], [place] [address]  Phosphorus     Status: None   Collection Time: 10/18/23  3:20 AM  Result Value Ref Range   Phosphorus 4.2 2.5 - 4.6 mg/dL    Comment: Performed at [facility], [address], [place], [place] [address]  CBC     Status: Abnormal   Collection Time: 10/18/23  3:20 AM  Result Value Ref Range   WBC 11.6 (H) 4.0 - 10.5 K/uL   RBC 3.14 (L) 3.87 - 5.11 MIL/uL   Hemoglobin 8.9 (L) 12.0 - 15.0  g/dL   HCT 71.7 (L) 63.9 - 53.9 %   MCV 89.8 80.0 - 100.0 fL   MCH 28.3 26.0 - 34.0 pg   MCHC 31.6 30.0 - 36.0 g/dL   RDW 86.2 88.4 - 84.4 %   Platelets 360 150 - 400 K/uL   nRBC 0.0 0.0 - 0.2 %    Comment: Performed at [facility], [address], [place], [place] [address]  Glucose, capillary     Status: Abnormal   Collection Time: 10/18/23  6:14 AM  Result Value Ref Range   Glucose-Capillary 103 (H) 70 - 99 mg/dL    Comment: Glucose reference range applies only to samples taken after fasting for at least 8 hours.  Glucose, capillary     Status: Abnormal   Collection Time: 10/18/23  1:58 PM  Result Value Ref Range   Glucose-Capillary 118 (H) 70 - 99 mg/dL    Comment: Glucose reference range applies only to samples taken after fasting for at least 8 hours.  Basic metabolic panel with GFR     Status: Abnormal   Collection Time: 10/19/23  3:27 AM  Result Value Ref Range   Sodium 138 135 - 145 mmol/L   Potassium 3.9 3.5 - 5.1 mmol/L   Chloride 104 98 - 111 mmol/L   CO2 23 22 - 32 mmol/L   Glucose, Bld 92 70 - 99 mg/dL    Comment: Glucose reference range applies only to samples taken after fasting for at least 8 hours.   BUN 20 8 - 23 mg/dL   Creatinine, Ser 8.99 0.44 - 1.00 mg/dL   Calcium  8.0 (L) 8.9 - 10.3 mg/dL   GFR, Estimated >39 >39 mL/min    Comment: (NOTE) Calculated using the CKD-EPI Creatinine Equation (2021)    Anion gap 11 5 - 15    Comment: Performed at [facility], [address] [place], [place] [address]  CBC  Status: Abnormal   Collection Time: 10/19/23  3:27 AM  Result Value Ref Range   WBC 11.1 (H) 4.0 - 10.5 K/uL   RBC 3.08 (L) 3.87 - 5.11 MIL/uL   Hemoglobin 8.8 (L) 12.0 - 15.0 g/dL   HCT 72.0 (L) 63.9 - 53.9 %   MCV 90.6 80.0 - 100.0 fL   MCH 28.6 26.0 - 34.0 pg   MCHC 31.5 30.0 - 36.0 g/dL   RDW 85.9 88.4 - 84.4 %   Platelets 384 150 - 400 K/uL   nRBC 0.0 0.0 - 0.2 %    Comment: Performed at [facility], [address], [place], [place] [address]  Magnesium      Status: None   Collection Time: 10/19/23  3:27 AM  Result Value Ref Range   Magnesium  1.9 1.7 - 2.4 mg/dL    Comment: Performed at [facility], [address] [place], [place] [address]  Glucose, capillary     Status: Abnormal   Collection Time: 10/19/23  6:48 AM  Result Value Ref Range   Glucose-Capillary 114 (H) 70 - 99 mg/dL    Comment: Glucose reference range applies only to samples taken after fasting for at least 8 hours.   Comment 1 Notify RN    Comment 2 Document in Chart    No results found.    Blood pressure 127/79, pulse 80, temperature 98.5 F (36.9 C), temperature source Oral, resp. rate 17, height 5' 9 (1.753 m), weight 122.3 kg, SpO2 95%.  Medical Problem List and Plan: 1. Functional deficits secondary to ***  -patient may *** shower  -ELOS/Goals: *** 2.  Antithrombotics: -DVT/anticoagulation:  Pharmaceutical: Heparin   -antiplatelet therapy: N/A 3. Pain Management: Still using IV fentanyl ?  --continue tylenol  qid, Robaxin 1000 mg QID and oxycodone  prn  4. Mood/Behavior/Sleep: LCSW to follow for evaluation and support  --Trazodone prn   -antipsychotic agents: N/A 5. Neuropsych/cognition: This patient *** capable of making decisions on *** own behalf. 6. Skin/Wound Care: Wound VAC change twice a week by WOC  --continue to encourage intake.  7. Fluids/Electrolytes/Nutrition: Recheck CMET in am.   --Continue Ensure BID. Add Juven 8.  Abdominal abscess: Drain in place. Cultures negative. --per  CCS to continue Zosyn /Micafungin for 14 days with EOT 10/21/23 9.  Malnutrition: Po intake poor and TPN weaned off 10/10  --continue Ensure BID 10. Hypotension: Continue midodrine 10 mg TID 11.  ANCA associated vasculitis/ILD:  Methotrexate  and Cellcept  on hold. Continue Incruse 12.  T2DM: Hgb A1C- 4.6. Monitor BS 13. Obesity: Educate on diet and exercise to help with mobility and overall health 14. H/o Depression: On  prozac and Wellbutrin .    ***  Sharlet GORMAN Schmitz, PA-C 10/19/2023

## 2023-10-19 NOTE — Progress Notes (Signed)
 PT Cancellation Note  Patient Details Name: [name] MRN: [id number] DOB: [birth year]   Cancelled Treatment:    Reason Eval/Treat Not Completed: (P) Fatigue/lethargy limiting ability to participate (pt drowsy and requesting to nap, fatigue after extensive ostomy education/wound vac dressing education and taking pain meds.) Will continue efforts per PT plan of care as schedule permits.   Lazette Estala M Michaeleen Down 10/19/2023, 3:05 PM

## 2023-10-19 NOTE — Progress Notes (Signed)
 Physical Therapy Treatment Patient Details Name: [name] MRN: [id number] DOB: Mar 05, 1960 Today's Date: 10/19/2023   History of Present Illness Pt is a 63 y/o female presenting on 9/28 via EMS tachypneic, tachycardic and hypotensive, with complaints of left sided abdominal pain that started 7 days prior. CT abdomen pelvis was obtained which revealed marked sigmoid colitis with perforation. Admitted for treatment of Septic shock secondary to sigmoid bowel perforation with feculent peritonitis. S/p 9/28 laparotomy sigmoid resection, colostomy. Intra abdominal abscess; s/p LLQ abdominal drain placement 10/16/23. Extensive past medical history not limited to ANCA associated vasculitis, previous diffuse pulmonary alveolar hemorrhage, chronic immunosuppression type 2 diabetes, HTN, HLD, ILD, previous sigmoid colon perforation, anxiety.    PT Comments  Pt is progressing towards goals. Currently pt is CGA for bed mobility with features of hospital bed, CGA for sit to stand with increased time and significant anterior flexion in order to get COM over BOS placing pt at high risk for falls. Pt was able to ambulate 100 ft at [facility] with very slow non-functional gait placing pt at a high risk for falls. Pt was very mobile and independent prior to hospitalization caring for her mother. P has family support at home on discharge. Due to pt current functional status, home set up and available assistance at home recommending skilled physical therapy services > 3 hours/day in order to address strength, balance and functional mobility to decrease risk for falls, injury, immobility, skin break down and re-hospitalization.      If plan is discharge home, recommend the following: A lot of help with walking and/or transfers;Assistance with cooking/housework;Assist for transportation;Supervision due to cognitive status     Equipment Recommendations  BSC/3in1;Hospital bed (bariatric)       Precautions / Restrictions  Precautions Precautions: Fall Recall of Precautions/Restrictions: Impaired Precaution/Restrictions Comments: Wound vac, TPN, abdominal protection precs; LLQ JP drain Restrictions Weight Bearing Restrictions Per Provider Order: No     Mobility  Bed Mobility Overal bed mobility: Needs Assistance Bed Mobility: Rolling, Sidelying to Sit, Sit to Sidelying Rolling: Supervision Sidelying to sit: Contact guard assist, HOB elevated, Used rails     Sit to sidelying: Contact guard assist, HOB elevated, Used rails General bed mobility comments: With [facility] elevated and use of bed rail pt CGA, pt has flat bed and no rails at home.    Transfers Overall transfer level: Needs assistance Equipment used: Rolling walker (2 wheels) Transfers: Sit to/from Stand Sit to Stand: From elevated surface, Contact guard assist           General transfer comment: EOB> bari RW, bari BSC<>RW cues for UE placement intermittently ~50% re-call    Ambulation/Gait Ambulation/Gait assistance: Contact guard assist Gait Distance (Feet): 100 Feet Assistive device: Rolling walker (2 wheels) (bariatric) Gait Pattern/deviations: Step-through pattern, Decreased step length - right, Decreased step length - left, Trunk flexed, Wide base of support Gait velocity: decreased Gait velocity interpretation: <1.31 ft/sec, indicative of household ambulator   General Gait Details: Pt ambulating out in the hall today. Occasional cues for activity pacing, posture and breathing for pain modulation. Pt requires intermittent cues for RW proximity to self. No buckling or overt LOB, less than functional pace with high risk for falls and brief standing rest breaks.     Balance Overall balance assessment: Needs assistance Sitting-balance support: No upper extremity supported, Feet supported Sitting balance-Leahy Scale: Fair     Standing balance support: Bilateral upper extremity supported, During functional activity, Reliant on  assistive device for balance Standing  balance-Leahy Scale: Poor Standing balance comment: able to perform her own peri-care with single UE support; reliant on RW support for dynamic standing tasks, Pt relies on atleaset unilateral UE support for balance.      Communication Communication Communication: No apparent difficulties  Cognition Arousal: Alert Behavior During Therapy: WFL for tasks assessed/performed, Anxious   PT - Cognitive impairments: Sequencing, Problem solving     Following commands: Impaired Following commands impaired: Follows one step commands with increased time, Follows multi-step commands inconsistently    Cueing Cueing Techniques: Verbal cues, Gestural cues, Visual cues     General Comments General comments (skin integrity, edema, etc.): SPO2 and HR WFL on RA during session      Pertinent Vitals/Pain Pain Assessment Pain Assessment: Faces Faces Pain Scale: Hurts a little bit Pain Location: abdomen after premedication Pain Descriptors / Indicators: Discomfort, Operative site guarding, Grimacing Pain Intervention(s): Monitored during session, Limited activity within patient's tolerance     PT Goals (current goals can now be found in the care plan section) Acute Rehab PT Goals Patient Stated Goal: Get back home and to take care of my mother PT Goal Formulation: With patient Time For Goal Achievement: 10/24/23 Potential to Achieve Goals: Fair Progress towards PT goals: Progressing toward goals    Frequency    Min 2X/week      PT Plan  Continue with current POC        AM-PAC PT 6 Clicks Mobility   Outcome Measure  Help needed turning from your back to your side while in a flat bed without using bedrails?: A Little Help needed moving from lying on your back to sitting on the side of a flat bed without using bedrails?: A Lot (without hospital bed features) Help needed moving to and from a bed to a chair (including a wheelchair)?: A  Little Help needed standing up from a chair using your arms (e.g., wheelchair or bedside chair)?: A Lot Help needed to walk in hospital room?: A Little Help needed climbing 3-5 steps with a railing? : Total 6 Click Score: 14    End of Session Equipment Utilized During Treatment: Gait belt Activity Tolerance: Patient tolerated treatment well Patient left: in bed;with call bell/phone within reach Nurse Communication: Mobility status PT Visit Diagnosis: Unsteadiness on feet (R26.81);Other abnormalities of gait and mobility (R26.89);Muscle weakness (generalized) (M62.81);Difficulty in walking, not elsewhere classified (R26.2);Pain Pain - part of body:  (abdomen)     Time: 8349-8267 PT Time Calculation (min) (ACUTE ONLY): 42 min  Charges:    $Therapeutic Activity: 38-52 mins PT General Charges $$ ACUTE PT VISIT: 1 Visit                     [name], DPT, CLT  Acute Rehabilitation Services Office: [phone] (Secure chat preferred)    [name] 10/19/2023, 5:40 PM

## 2023-10-19 NOTE — Progress Notes (Signed)
 12 Days Post-Op   Subjective/Chief Complaint: No acute events overnight.  Reports having a good day yesterday. Ate over 50% of lunch, drank two protein shakes, had spaghetti at dinner. Ostomy functioning. VAC lost its seal late yesterday and WOC RN coming to change today.   Objective: Vital signs in last 24 hours: Temp:  [97.5 F (36.4 C)-98.9 F (37.2 C)] 97.6 F (36.4 C) (10/10 0737) Pulse Rate:  [75-82] 75 (10/10 0737) Resp:  [14-27] 16 (10/10 0737) BP: (103-131)/(50-73) 103/50 (10/10 0737) SpO2:  [94 %-100 %] 98 % (10/10 0737) Last BM Date : 10/18/23  Intake/Output from previous day: 10/09 0701 - 10/10 0700 In: 1224.1 [P.O.:477; I.V.:747.1] Out: 1550 [Urine:1400; Drains:60; Stool:90] Intake/Output this shift: No intake/output data recorded.  GEN: alert NAD Pulm: normal effort CV: RRR Abd: soft, appropriately tender, NPWT off, ostomy in place with semisolid brown stool in pouch, LLQ drain with amber/serous fluid (60 mL)   Lab Results:  Recent Labs    10/18/23 0320 10/19/23 0327  WBC 11.6* 11.1*  HGB 8.9* 8.8*  HCT 28.2* 27.9*  PLT 360 384    BMET Recent Labs    10/18/23 0320 10/19/23 0327  NA 136 138  K 4.0 3.9  CL 103 104  CO2 24 23  GLUCOSE 115* 92  BUN 23 20  CREATININE 1.10* 1.00  CALCIUM  8.1* 8.0*   PT/INR Recent Labs    10/16/23 1149  LABPROT 15.5*  INR 1.2   ABG No results for input(s): PHART, HCO3 in the last 72 hours.  Invalid input(s): PCO2, PO2  Studies/Results: No results found.   Anti-infectives: Anti-infectives (From admission, onward)    Start     Dose/Rate Route Frequency Ordered Stop   10/07/23 2200  piperacillin -tazobactam (ZOSYN ) IVPB 3.375 g       Placed in Followed by Linked Group   3.375 g 12.5 mL/hr over 240 Minutes Intravenous Every 8 hours 10/07/23 1351 10/21/23 2359   10/07/23 1732  vancomycin variable dose per unstable renal function (pharmacist dosing)  Status:  Discontinued         Does not  apply See admin instructions 10/07/23 1732 10/08/23 0825   10/07/23 1545  micafungin (MYCAMINE) 150 mg in sodium chloride  0.9 % 100 mL IVPB        150 mg 107.5 mL/hr over 1 Hours Intravenous Every 24 hours 10/07/23 1530 10/21/23 2359   10/07/23 1530  metroNIDAZOLE  (FLAGYL ) IVPB 500 mg  Status:  Discontinued        500 mg 100 mL/hr over 60 Minutes Intravenous Every 12 hours 10/07/23 1518 10/07/23 1532   10/07/23 1530  vancomycin (VANCOREADY) IVPB 2000 mg/400 mL        2,000 mg 200 mL/hr over 120 Minutes Intravenous  Once 10/07/23 1526 10/07/23 2033   10/07/23 1400  metroNIDAZOLE  (FLAGYL ) IVPB 500 mg        500 mg 100 mL/hr over 60 Minutes Intravenous  Once 10/07/23 1346 10/07/23 1507   10/07/23 1400  piperacillin -tazobactam (ZOSYN ) IVPB 3.375 g       Placed in Followed by Linked Group   3.375 g 100 mL/hr over 30 Minutes Intravenous  Once 10/07/23 1351 10/07/23 1502       Assessment/Plan: s/p Procedure(s): LAPAROTOMY, EXPLORATORY (N/A) EXCISION, SMALL INTESTINE (N/A) Advance diet. Allow soft food today OOB/ PT Sigmoid bowel perforation with feculent peritonitis, likely due to stercoral colitis vs diverticulitis POD#12 s/p  LAPAROTOMY, EXPLORATORY, Sigmoid colon resection, Hartman's colostomy 9.28 Dr. Rubin - soft diet, started  weaning TNA 10/5, calorie count; twice daily chocolate boost ordered;  - CT scan 10/6 shows multiple pelvis abscesses, s/p IR perc drain 10/7; Cx NGTD, follow. - Vac change Mon/Thurs; appreciate WOC RN assistance with pouch leakage/close proximity to midline wound. May ultimately need to go home on wet-to-dry dressing changes but would like to try to continue [facility] for now.   FEN: Soft, stop TPN today ID: Zosyn , Micafungin >>Cx from pelvic fluid collections NGTD (72h), tentative plan to stop abx after a total of 14 days (10/12) VTE: SCD's, SQH Foley: purewick; D/C and encourage bedside commode  Dispo: progressive care; CIR versus home pending progress with  PT/OT; CCS will be available PRN over the weekend and will see the patient again Monday morning.  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 Nephrologist: [name]  LOS: 12 days    Almarie RAMAN Ermalee Mealy 10/19/2023

## 2023-10-20 ENCOUNTER — Inpatient Hospital Stay (HOSPITAL_COMMUNITY)
Admission: AD | Admit: 2023-10-20 | Discharge: 2023-10-28 | DRG: 945 | Disposition: A | Discharge status: Home Health | Source: Intra-hospital | Attending: Physical Medicine & Rehabilitation | Admitting: Physical Medicine & Rehabilitation

## 2023-10-20 ENCOUNTER — Other Ambulatory Visit: Payer: Self-pay

## 2023-10-20 ENCOUNTER — Encounter (HOSPITAL_COMMUNITY): Payer: Self-pay | Admitting: Physical Medicine & Rehabilitation

## 2023-10-20 DIAGNOSIS — E46 Unspecified protein-calorie malnutrition: Secondary | ICD-10-CM | POA: Diagnosis present

## 2023-10-20 DIAGNOSIS — I7782 Antineutrophilic cytoplasmic antibody (ANCA) vasculitis: Secondary | ICD-10-CM | POA: Diagnosis present

## 2023-10-20 DIAGNOSIS — E1140 Type 2 diabetes mellitus with diabetic neuropathy, unspecified: Secondary | ICD-10-CM | POA: Diagnosis present

## 2023-10-20 DIAGNOSIS — D62 Acute posthemorrhagic anemia: Secondary | ICD-10-CM | POA: Diagnosis present

## 2023-10-20 DIAGNOSIS — E669 Obesity, unspecified: Secondary | ICD-10-CM | POA: Diagnosis present

## 2023-10-20 DIAGNOSIS — F32A Depression, unspecified: Secondary | ICD-10-CM | POA: Diagnosis present

## 2023-10-20 DIAGNOSIS — D75839 Thrombocytosis, unspecified: Secondary | ICD-10-CM | POA: Diagnosis present

## 2023-10-20 DIAGNOSIS — I959 Hypotension, unspecified: Secondary | ICD-10-CM | POA: Diagnosis not present

## 2023-10-20 DIAGNOSIS — I10 Essential (primary) hypertension: Secondary | ICD-10-CM | POA: Diagnosis present

## 2023-10-20 DIAGNOSIS — Z794 Long term (current) use of insulin: Secondary | ICD-10-CM | POA: Diagnosis not present

## 2023-10-20 DIAGNOSIS — L02211 Cutaneous abscess of abdominal wall: Secondary | ICD-10-CM | POA: Diagnosis present

## 2023-10-20 DIAGNOSIS — R059 Cough, unspecified: Secondary | ICD-10-CM | POA: Diagnosis not present

## 2023-10-20 DIAGNOSIS — Z833 Family history of diabetes mellitus: Secondary | ICD-10-CM

## 2023-10-20 DIAGNOSIS — Z8249 Family history of ischemic heart disease and other diseases of the circulatory system: Secondary | ICD-10-CM

## 2023-10-20 DIAGNOSIS — M317 Microscopic polyangiitis: Secondary | ICD-10-CM | POA: Diagnosis present

## 2023-10-20 DIAGNOSIS — R5381 Other malaise: Principal | ICD-10-CM | POA: Diagnosis present

## 2023-10-20 DIAGNOSIS — Z933 Colostomy status: Secondary | ICD-10-CM | POA: Diagnosis not present

## 2023-10-20 DIAGNOSIS — G4700 Insomnia, unspecified: Secondary | ICD-10-CM | POA: Diagnosis present

## 2023-10-20 DIAGNOSIS — J189 Pneumonia, unspecified organism: Secondary | ICD-10-CM | POA: Diagnosis not present

## 2023-10-20 DIAGNOSIS — J439 Emphysema, unspecified: Secondary | ICD-10-CM | POA: Diagnosis present

## 2023-10-20 DIAGNOSIS — J849 Interstitial pulmonary disease, unspecified: Secondary | ICD-10-CM | POA: Diagnosis present

## 2023-10-20 DIAGNOSIS — Z79899 Other long term (current) drug therapy: Secondary | ICD-10-CM

## 2023-10-20 DIAGNOSIS — K219 Gastro-esophageal reflux disease without esophagitis: Secondary | ICD-10-CM | POA: Diagnosis present

## 2023-10-20 DIAGNOSIS — K659 Peritonitis, unspecified: Secondary | ICD-10-CM

## 2023-10-20 DIAGNOSIS — G8918 Other acute postprocedural pain: Secondary | ICD-10-CM | POA: Diagnosis not present

## 2023-10-20 DIAGNOSIS — T81321D Disruption or dehiscence of closure of internal operation (surgical) wound of abdominal wall muscle or fascia, subsequent encounter: Secondary | ICD-10-CM | POA: Diagnosis not present

## 2023-10-20 DIAGNOSIS — Z6839 Body mass index (BMI) 39.0-39.9, adult: Secondary | ICD-10-CM

## 2023-10-20 DIAGNOSIS — T148XXA Other injury of unspecified body region, initial encounter: Secondary | ICD-10-CM | POA: Diagnosis not present

## 2023-10-20 DIAGNOSIS — K631 Perforation of intestine (nontraumatic): Secondary | ICD-10-CM | POA: Diagnosis not present

## 2023-10-20 DIAGNOSIS — F411 Generalized anxiety disorder: Secondary | ICD-10-CM | POA: Diagnosis not present

## 2023-10-20 DIAGNOSIS — R7989 Other specified abnormal findings of blood chemistry: Secondary | ICD-10-CM | POA: Insufficient documentation

## 2023-10-20 DIAGNOSIS — I428 Other cardiomyopathies: Secondary | ICD-10-CM | POA: Diagnosis present

## 2023-10-20 DIAGNOSIS — F419 Anxiety disorder, unspecified: Secondary | ICD-10-CM | POA: Diagnosis present

## 2023-10-20 DIAGNOSIS — Z87891 Personal history of nicotine dependence: Secondary | ICD-10-CM

## 2023-10-20 DIAGNOSIS — K5909 Other constipation: Secondary | ICD-10-CM | POA: Diagnosis present

## 2023-10-20 DIAGNOSIS — K56609 Unspecified intestinal obstruction, unspecified as to partial versus complete obstruction: Secondary | ICD-10-CM | POA: Diagnosis present

## 2023-10-20 DIAGNOSIS — E119 Type 2 diabetes mellitus without complications: Secondary | ICD-10-CM | POA: Diagnosis not present

## 2023-10-20 LAB — BASIC METABOLIC PANEL WITH GFR
Anion gap: 10 (ref 5–15)
BUN: 19 mg/dL (ref 8–23)
CO2: 24 mmol/L (ref 22–32)
Calcium: 8.1 mg/dL — ABNORMAL LOW (ref 8.9–10.3)
Chloride: 104 mmol/L (ref 98–111)
Creatinine, Ser: 0.98 mg/dL (ref 0.44–1.00)
GFR, Estimated: >60 mL/min (ref >60)
Glucose, Bld: 81 mg/dL (ref 70–99)
Potassium: 4 mmol/L (ref 3.5–5.1)
Sodium: 138 mmol/L (ref 135–145)

## 2023-10-20 LAB — CBC
HCT: 27.9 % — ABNORMAL LOW (ref 36.0–46.0)
Hemoglobin: 8.6 g/dL — ABNORMAL LOW (ref 12.0–15.0)
MCH: 27.9 pg (ref 26.0–34.0)
MCHC: 30.8 g/dL (ref 30.0–36.0)
MCV: 90.6 fL (ref 80.0–100.0)
Platelets: 417 K/uL — ABNORMAL HIGH (ref 150–400)
RBC: 3.08 MIL/uL — ABNORMAL LOW (ref 3.87–5.11)
RDW: 14.1 % (ref 11.5–15.5)
WBC: 11.9 K/uL — ABNORMAL HIGH (ref 4.0–10.5)
nRBC: 0 % (ref 0.0–0.2)

## 2023-10-20 LAB — MAGNESIUM: Magnesium: 1.9 mg/dL (ref 1.7–2.4)

## 2023-10-20 MED ORDER — VITAMIN C 500 MG PO TABS
500.0000 mg | ORAL_TABLET | Freq: Every day | ORAL | Status: DC
  Administered 2023-10-20 – 2023-10-21 (×2): 500 mg via ORAL
  Filled 2023-10-20 (×2): qty 1

## 2023-10-20 MED ORDER — PROCHLORPERAZINE EDISYLATE 10 MG/2ML IJ SOLN
5.0000 mg | Freq: Four times a day (QID) | INTRAMUSCULAR | Status: DC | PRN
  Administered 2023-10-22: 10 mg via INTRAVENOUS
  Filled 2023-10-20: qty 2

## 2023-10-20 MED ORDER — ENOXAPARIN SODIUM 40 MG/0.4ML IJ SOSY
40.0000 mg | PREFILLED_SYRINGE | INTRAMUSCULAR | Status: DC
  Administered 2023-10-20 – 2023-10-27 (×8): 40 mg via SUBCUTANEOUS
  Filled 2023-10-20 (×8): qty 0.4

## 2023-10-20 MED ORDER — BOOST PLUS PO LIQD: 237.0000 mL | Freq: Two times a day (BID) | ORAL | Status: DC

## 2023-10-20 MED ORDER — MIDODRINE HCL 10 MG PO TABS: 10.0000 mg | ORAL_TABLET | Freq: Three times a day (TID) | ORAL | Status: DC

## 2023-10-20 MED ORDER — BUPROPION HCL ER (XL) 300 MG PO TB24
450.0000 mg | ORAL_TABLET | Freq: Every day | ORAL | Status: DC
  Administered 2023-10-21 – 2023-10-28 (×8): 450 mg via ORAL
  Filled 2023-10-20 (×8): qty 1

## 2023-10-20 MED ORDER — PREGABALIN 50 MG PO CAPS
50.0000 mg | ORAL_CAPSULE | Freq: Two times a day (BID) | ORAL | Status: DC
  Administered 2023-10-20 – 2023-10-28 (×16): 50 mg via ORAL
  Filled 2023-10-20 (×16): qty 1

## 2023-10-20 MED ORDER — ORAL CARE MOUTH RINSE: 15.0000 mL | OROMUCOSAL | Status: DC | PRN

## 2023-10-20 MED ORDER — ENSURE PLUS HIGH PROTEIN PO LIQD
237.0000 mL | Freq: Two times a day (BID) | ORAL | Status: DC
  Administered 2023-10-20 – 2023-10-25 (×5): 237 mL via ORAL

## 2023-10-20 MED ORDER — VITAMIN A 3 MG (10000 UNIT) PO CAPS: 10000.0000 [IU] | ORAL_CAPSULE | Freq: Every day | ORAL | Status: DC

## 2023-10-20 MED ORDER — PROCHLORPERAZINE EDISYLATE 10 MG/2ML IJ SOLN: 10.0000 mg | Freq: Four times a day (QID) | INTRAMUSCULAR | Status: DC | PRN

## 2023-10-20 MED ORDER — SODIUM CHLORIDE 0.9 % IV SOLN: INTRAVENOUS | Status: DC

## 2023-10-20 MED ORDER — ALPRAZOLAM 0.25 MG PO TABS
0.5000 mg | ORAL_TABLET | Freq: Two times a day (BID) | ORAL | Status: DC | PRN
  Administered 2023-10-20: 0.5 mg via ORAL
  Filled 2023-10-20: qty 2

## 2023-10-20 MED ORDER — PIPERACILLIN-TAZOBACTAM 3.375 G IVPB: 3.3750 g | Freq: Three times a day (TID) | INTRAVENOUS | Status: DC

## 2023-10-20 MED ORDER — GLUCAGON HCL RDNA (DIAGNOSTIC) 1 MG IJ SOLR: 1.0000 mg | INTRAMUSCULAR | Status: DC | PRN

## 2023-10-20 MED ORDER — ADULT MULTIVITAMIN W/MINERALS CH: 1.0000 | ORAL_TABLET | Freq: Every day | ORAL | Status: DC

## 2023-10-20 MED ORDER — ENSURE PLUS HIGH PROTEIN PO LIQD: 237.0000 mL | Freq: Two times a day (BID) | ORAL | Status: DC

## 2023-10-20 MED ORDER — BISACODYL 10 MG RE SUPP: 10.0000 mg | Freq: Every day | RECTAL | Status: DC | PRN

## 2023-10-20 MED ORDER — TRAZODONE HCL 50 MG PO TABS
25.0000 mg | ORAL_TABLET | Freq: Every evening | ORAL | Status: DC | PRN
  Administered 2023-10-21 – 2023-10-27 (×7): 50 mg via ORAL
  Filled 2023-10-20 (×7): qty 1

## 2023-10-20 MED ORDER — ACETAMINOPHEN 325 MG PO TABS
650.0000 mg | ORAL_TABLET | Freq: Four times a day (QID) | ORAL | Status: DC
  Administered 2023-10-20 – 2023-10-28 (×31): 650 mg via ORAL
  Filled 2023-10-20 (×31): qty 2

## 2023-10-20 MED ORDER — PROCHLORPERAZINE MALEATE 5 MG PO TABS
5.0000 mg | ORAL_TABLET | Freq: Four times a day (QID) | ORAL | Status: DC | PRN
  Filled 2023-10-20 (×2): qty 2

## 2023-10-20 MED ORDER — JUVEN PO PACK
1.0000 | PACK | Freq: Two times a day (BID) | ORAL | Status: DC
  Administered 2023-10-20: 1 via ORAL
  Filled 2023-10-20 (×6): qty 1

## 2023-10-20 MED ORDER — VITAMIN A 3 MG (10000 UNIT) PO CAPS
10000.0000 [IU] | ORAL_CAPSULE | Freq: Every day | ORAL | Status: AC
  Administered 2023-10-21: 10000 [IU] via ORAL
  Filled 2023-10-20: qty 1

## 2023-10-20 MED ORDER — METHOCARBAMOL 500 MG PO TABS
1000.0000 mg | ORAL_TABLET | Freq: Four times a day (QID) | ORAL | Status: DC
  Administered 2023-10-20 – 2023-10-25 (×15): 1000 mg via ORAL
  Filled 2023-10-20 (×19): qty 2

## 2023-10-20 MED ORDER — DIPHENHYDRAMINE HCL 25 MG PO CAPS: 25.0000 mg | ORAL_CAPSULE | Freq: Four times a day (QID) | ORAL | Status: DC | PRN

## 2023-10-20 MED ORDER — ALUM & MAG HYDROXIDE-SIMETH 200-200-20 MG/5ML PO SUSP: 30.0000 mL | ORAL | Status: DC | PRN

## 2023-10-20 MED ORDER — BOOST PLUS PO LIQD
237.0000 mL | Freq: Two times a day (BID) | ORAL | Status: DC
  Administered 2023-10-20 – 2023-10-27 (×13): 237 mL via ORAL
  Filled 2023-10-20 (×18): qty 237

## 2023-10-20 MED ORDER — OXYCODONE HCL 5 MG PO TABS
5.0000 mg | ORAL_TABLET | Freq: Four times a day (QID) | ORAL | Status: DC | PRN
  Administered 2023-10-20: 5 mg via ORAL
  Administered 2023-10-20 – 2023-10-27 (×9): 10 mg via ORAL
  Filled 2023-10-20 (×6): qty 2
  Filled 2023-10-20: qty 1
  Filled 2023-10-20 (×6): qty 2

## 2023-10-20 MED ORDER — FLEET ENEMA RE ENEM: 1.0000 | ENEMA | Freq: Once | RECTAL | Status: DC | PRN

## 2023-10-20 MED ORDER — UMECLIDINIUM BROMIDE 62.5 MCG/ACT IN AEPB
1.0000 | INHALATION_SPRAY | Freq: Every day | RESPIRATORY_TRACT | Status: DC
  Administered 2023-10-21 – 2023-10-27 (×5): 1 via RESPIRATORY_TRACT
  Filled 2023-10-20: qty 7

## 2023-10-20 MED ORDER — VITAMIN C 500 MG PO TABS
250.0000 mg | ORAL_TABLET | Freq: Two times a day (BID) | ORAL | Status: DC
  Administered 2023-10-20: 250 mg via ORAL
  Filled 2023-10-20 (×2): qty 1

## 2023-10-20 MED ORDER — SODIUM CHLORIDE 0.9% FLUSH
10.0000 mL | Freq: Two times a day (BID) | INTRAVENOUS | Status: DC
  Administered 2023-10-20 – 2023-10-27 (×11): 10 mL

## 2023-10-20 MED ORDER — CHLORHEXIDINE GLUCONATE CLOTH 2 % EX PADS
6.0000 | MEDICATED_PAD | Freq: Every day | CUTANEOUS | Status: DC
  Administered 2023-10-20 – 2023-10-22 (×3): 6 via TOPICAL

## 2023-10-20 MED ORDER — ASCORBIC ACID 250 MG PO TABS: 250.0000 mg | ORAL_TABLET | Freq: Two times a day (BID) | ORAL | Status: DC

## 2023-10-20 MED ORDER — IPRATROPIUM-ALBUTEROL 0.5-2.5 (3) MG/3ML IN SOLN: 3.0000 mL | RESPIRATORY_TRACT | Status: DC | PRN

## 2023-10-20 MED ORDER — LIDOCAINE 5 % EX PTCH
2.0000 | MEDICATED_PATCH | CUTANEOUS | Status: DC
  Administered 2023-10-21 – 2023-10-27 (×6): 2 via TRANSDERMAL
  Filled 2023-10-20 (×7): qty 2

## 2023-10-20 MED ORDER — SODIUM CHLORIDE 0.9% FLUSH: 10.0000 mL | INTRAVENOUS | Status: DC | PRN

## 2023-10-20 MED ORDER — DOCUSATE SODIUM 50 MG/5ML PO LIQD: 100.0000 mg | Freq: Two times a day (BID) | ORAL | 0 refills | Status: DC | PRN

## 2023-10-20 MED ORDER — DOCUSATE SODIUM 50 MG/5ML PO LIQD
100.0000 mg | Freq: Two times a day (BID) | ORAL | Status: DC | PRN
Start: 2023-10-20 — End: 2023-10-28

## 2023-10-20 MED ORDER — POLYETHYLENE GLYCOL 3350 17 G PO PACK
17.0000 g | PACK | Freq: Every day | ORAL | Status: DC
  Administered 2023-10-21 – 2023-10-27 (×4): 17 g via ORAL
  Filled 2023-10-20 (×10): qty 1

## 2023-10-20 MED ORDER — METOPROLOL TARTRATE 5 MG/5ML IV SOLN: 5.0000 mg | INTRAVENOUS | Status: DC | PRN

## 2023-10-20 MED ORDER — SODIUM CHLORIDE 0.9 % IV SOLN
150.0000 mg | INTRAVENOUS | Status: AC
  Administered 2023-10-20 – 2023-10-21 (×2): 150 mg via INTRAVENOUS
  Filled 2023-10-20 (×2): qty 7.5

## 2023-10-20 MED ORDER — OXYCODONE HCL 5 MG PO TABS: 5.0000 mg | ORAL_TABLET | Freq: Once | ORAL | Status: DC

## 2023-10-20 MED ORDER — PIPERACILLIN-TAZOBACTAM 3.375 G IVPB
3.3750 g | Freq: Three times a day (TID) | INTRAVENOUS | Status: AC
  Administered 2023-10-20 – 2023-10-21 (×5): 3.375 g via INTRAVENOUS
  Filled 2023-10-20 (×5): qty 50

## 2023-10-20 MED ORDER — ADULT MULTIVITAMIN W/MINERALS CH
1.0000 | ORAL_TABLET | Freq: Every day | ORAL | Status: DC
  Administered 2023-10-21 – 2023-10-28 (×8): 1 via ORAL
  Filled 2023-10-20 (×8): qty 1

## 2023-10-20 MED ORDER — FLUOXETINE HCL 20 MG PO CAPS
40.0000 mg | ORAL_CAPSULE | Freq: Every day | ORAL | Status: DC
  Administered 2023-10-21 – 2023-10-28 (×8): 40 mg via ORAL
  Filled 2023-10-20 (×8): qty 2

## 2023-10-20 MED ORDER — PHENOL 1.4 % MT LIQD: 1.0000 | OROMUCOSAL | Status: DC | PRN

## 2023-10-20 MED ORDER — ACETAMINOPHEN 325 MG PO TABS: 325.0000 mg | ORAL_TABLET | ORAL | Status: DC | PRN

## 2023-10-20 MED ORDER — MUPIROCIN 2 % EX OINT
TOPICAL_OINTMENT | Freq: Two times a day (BID) | CUTANEOUS | Status: DC
  Administered 2023-10-21 – 2023-10-27 (×2): 1 via NASAL
  Filled 2023-10-20: qty 22

## 2023-10-20 MED ORDER — METHOCARBAMOL 1000 MG PO TABS: 1000.0000 mg | ORAL_TABLET | Freq: Four times a day (QID) | ORAL | Status: DC

## 2023-10-20 MED ORDER — ONDANSETRON HCL 4 MG/2ML IJ SOLN
4.0000 mg | Freq: Four times a day (QID) | INTRAMUSCULAR | Status: DC
  Administered 2023-10-20 – 2023-10-24 (×16): 4 mg via INTRAVENOUS
  Filled 2023-10-20 (×16): qty 2

## 2023-10-20 MED ORDER — MIDODRINE HCL 5 MG PO TABS
10.0000 mg | ORAL_TABLET | Freq: Three times a day (TID) | ORAL | Status: DC
  Administered 2023-10-20 – 2023-10-22 (×6): 10 mg via ORAL
  Filled 2023-10-20 (×6): qty 2

## 2023-10-20 MED ORDER — DOCUSATE SODIUM 100 MG PO CAPS
100.0000 mg | ORAL_CAPSULE | Freq: Two times a day (BID) | ORAL | Status: DC
  Administered 2023-10-20 – 2023-10-28 (×16): 100 mg via ORAL
  Filled 2023-10-20 (×16): qty 1

## 2023-10-20 MED ORDER — PROCHLORPERAZINE 25 MG RE SUPP: 12.5000 mg | Freq: Four times a day (QID) | RECTAL | Status: DC | PRN

## 2023-10-20 NOTE — Progress Notes (Signed)
 Inpatient Rehabilitation Admission Medication Review by a Pharmacist  A complete drug regimen review was completed for this patient to identify any potential clinically significant medication issues.  High Risk Drug Classes Is patient taking? Indication by Medication  Antipsychotic Yes, as an intravenous medication Compazine - nausea  Anticoagulant Yes Lovenox  - VTE prophylaxis  Antibiotic Yes Micafungin & Zosyn  through 10/12 - perf bowel with purulent and feculent peritonitis   Opioid Yes Oxycodone  - moderate pain  Antiplatelet No   Hypoglycemics/insulin  No   Vasoactive Medication Yes Midodrine - hypotension Metoprolol  - PRN HR > 110  Chemotherapy No   Other Yes Alprazolam  - anxiety Bupropion , Fluoxetine - mood Ascorbic acid, Vitamins A & C, Ensure Plus, Boost Plus, MVI, Juven - supplements Chloraseptic spray - sore throat Glucagon - low blood sugar Duonebs - shortness of breath Lidocaine  patch - pain Mupirocin, Chlorhexidine  - skin care Pregabalin - nerve pain Incruse Ellipta - ILD Miralax , fleet enema , docusate , bisacodyl , and - constipation Maalox- indigestion Diphenhydramine - itching  Acetaminophen - pain  Zofran - n/v  Robaxin- muscle spasms   trazodone and -insomnia     Type of Medication Issue Identified Description of Issue Recommendation(s)  Drug Interaction(s) (clinically significant)     Duplicate Therapy     Allergy     No Medication Administration End Date     Incorrect Dose     Additional Drug Therapy Needed     Significant med changes from prior encounter (inform family/care partners about these prior to discharge). PTA cellcept  and methotrexate  for hx ANA alveolar hemorrhage remain on hold. Coreg , Entresto , NTG SL, Ozempic, Folic acid , not continued during inpatient or CIR encounter.  PTA Spiriva  substituted with Incruse Ellipta per hospital formulary. Communicate relevant medication changes to patient/family members at discharge from CIR.    Restart or discontinue PTA meds not resumed in CIR at discharge if clinically indicated.   Other       Clinically significant medication issues were identified that warrant physician communication and completion of prescribed/recommended actions by midnight of the next day:  No  Name of provider notified for urgent issues identified:   Provider Method of Notification:     Pharmacist comments:   Time spent performing this drug regimen review (minutes):  20    Rocky Slade, PharmD, BCPS 10/20/2023 2:23 PM

## 2023-10-20 NOTE — H&P (Signed)
 Physical Medicine and Rehabilitation Admission H&P        Chief Complaint  Patient presents with   Functional deficits due to debility      HPI: [name] is a 63 year old female with history of ANCA associated vasculitis, anxiety, depression T2DM, chronic fatigue, colitis, DPH, HTN, LBP with sciatica, ILD, microscopic polyangiitis, paresthesias, stercoral perforation of sigmoid colon who was admitted on 10/28 with 3 week history of constipation. She presented to ED with abdominal pain,  2 day history of decrease in UOP and signs of early sepsi. She was found to have sigmoid colitis with bowel perforation with peritonitis. She was taken to OR emergently for Exp Lap with sigmoid resection and Hartman's colostomy 9/28 by Dr. Rubin.  Post op placed on broad spectrum antibiotics, pressors and had issues with pain control as well as anxiety. Post op started on TNA for nutritional  support due to ileus, had issues with pain control as well as delirium.    Follow up CT A/P 10/6 showed multiple pelvic abscess and Dr. Wendolyn consulted for drain placement on 10/7. Fluid  cultures done negative so far and surgery recommends Zosyn  and Micafungin to continue for 2 week course and white count improving. She continues on midodrine for BP support. PT/OT has been working with patient who is limited by pain, weakness and requires multiple rest breaks with minimal activity. 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 CIR recommended due to functional decline.      ROS         Past Medical History:  Diagnosis Date   Acute blood loss anemia 06/23/2013   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HCC)     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HCC) 06/24/2013   Anxiety associated with depression 09/23/2012   At high risk for falls 09/23/2012    She has had a fractured ankle and tendon tear with falls over the last couple years.   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   Diverticulitis of large intestine with perforation 09/23/2012   Dyspnea 05/11/2014   GERD (gastroesophageal reflux disease)     Hemoptysis 06/23/2013   History of endometriosis 1997   Hx of tobacco use, presenting hazards to health 09/23/2012   Hyperglycemia 07/24/2013   Hypertension     Hypokalemia 07/20/2013   ILD (interstitial lung disease) (HCC) 01/22/2014   Leukocytosis 05/11/2014   MPA (microscopic polyangiitis) (HCC) 2015   Numbness     Obesity (BMI 30-39.9) 09/23/2012   Paresthesia 12/26/2016   Perforation of sigmoid colon - stercoral 09/27/2012   Physical deconditioning 04/30/2015   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   Respiratory failure with hypoxia (HCC) 06/23/2013   Right leg swelling 12/26/2016             Past Surgical History:  Procedure Laterality Date   APPENDECTOMY       BLADDER REPAIR       BOWEL RESECTION N/A 10/07/2023    Procedure: EXCISION, SMALL INTESTINE;  Surgeon: Rubin Calamity, MD;  Location: [facility] OR;  Service: General;  Laterality: N/A;   DILATATION & CURETTAGE/HYSTEROSCOPY WITH MYOSURE N/A 06/03/2019    Procedure: DILATATION & CURETTAGE/HYSTEROSCOPY WITH MYOSURE;  Surgeon: Curlene Agent, MD;  Location: [facility] OR;  Service: Gynecology;  Laterality: N/A;   IR CATHETER  TUBE CHANGE   06/24/2019   IR FLUORO GUIDE CV LINE RIGHT   06/16/2019   IR RADIOLOGIST EVAL & MGMT   07/29/2019   IR US  GUIDE VASC ACCESS RIGHT   06/16/2019   LAPAROSCOPY   1997    dx of endometriosis   LAPAROSCOPY   04/28/2000     Laparoscopy with lysis of adhesions, hysteroscopy, D&C.   LAPAROTOMY N/A 10/07/2023    Procedure: LAPAROTOMY, EXPLORATORY;  Surgeon: Rubin Calamity, MD;  Location: [facility] OR;  Service: General;  Laterality: N/A;   TONSILLECTOMY                   Family History  Problem Relation Age of Onset    Diabetes Mother          AODM   Diabetes Father          AODM   Hypertension Father     Obesity Father     Breast cancer Paternal Grandmother     Birth defects Cousin     CAD Other          [facility]          Social History:  reports that she quit smoking about 22 years ago. Her smoking use included cigarettes. She started smoking about 49 years ago. She has a 27 pack-year smoking history. She has never used smokeless tobacco. She reports that she does not drink alcohol and does not use drugs.     Allergies      Allergies  Allergen Reactions   Mucinex [Guaifenesin Er] Nausea And Vomiting   Sulfa Antibiotics Nausea And Vomiting   Glucophage [Metformin] Nausea Only              Medications Prior to Admission  Medication Sig Dispense Refill   ALPRAZolam  (XANAX ) 1 MG tablet Take 1 tablet (1 mg total) by mouth 3 (three) times daily as needed for anxiety. (Patient taking differently: Take 0.5-1.5 mg by mouth See admin instructions. Take 1-1.5 tablets (1-1.5mg ) by mouth every night at bedtime and take 0.5-1 tablets (0.5-1mg ) twice daily as needed for anxiety.) 5 tablet 0   buPROPion  (WELLBUTRIN  XL) 150 MG 24 hr tablet Take 450 mg by mouth daily.       carvedilol  (COREG ) 12.5 MG tablet Take 1 tablet (12.5 mg total) by mouth 2 (two) times daily. 180 tablet 3   ergocalciferol (VITAMIN D2) 1.25 MG (50000 UT) capsule Take 50,000 Units by mouth every Sunday.       FLUoxetine (PROZAC) 40 MG capsule Take 40 mg by mouth daily.       folic acid  (FOLVITE ) 1 MG tablet Take 1 mg by mouth daily.        methotrexate  50 MG/2ML injection Inject 15 mg into the vein every Sunday.       mycophenolate  (CELLCEPT ) 500 MG tablet Take 3 tablets (1,500 mg total) by mouth 2 (two) times daily.       nitroGLYCERIN  (NITROSTAT ) 0.4 MG SL tablet Place 1 tablet (0.4 mg total) under the tongue every 5 (five) minutes as needed for chest pain. 90 tablet 3   polyethylene glycol (MIRALAX  / GLYCOLAX ) packet Take 17 g by mouth  2 (two) times daily as needed (constiaption).       sacubitril -valsartan  (ENTRESTO ) 49-51 MG Take 1 tablet by mouth 2 (two) times daily. 180 tablet 3   Semaglutide, 2 MG/DOSE, (OZEMPIC, 2 MG/DOSE,) 8 MG/3ML SOPN Inject 2 mg into the skin every Sunday.  Tiotropium Bromide Monohydrate  (SPIRIVA  RESPIMAT) 1.25 MCG/ACT AERS Inhale 2 puffs into the lungs daily. (Patient taking differently: Inhale 2 puffs into the lungs daily as needed (wheezing shortness of breath).) 12 g 3   pregabalin (LYRICA) 100 MG capsule Take 100 mg by mouth 2 (two) times daily. (Patient not taking: Reported on 10/09/2023)                Home: Home Living Family/patient expects to be discharged to:: Private residence Living Arrangements: Other (Comment) (going to stay at her sisters house) Available Help at Discharge: Family, Available 24 hours/day (sister) Type of Home: Mobile home Home Access: Level entry Home Layout: One level Bathroom Shower/Tub: Engineer, manufacturing systems: Standard Bathroom Accessibility: Yes Home Equipment: Tub bench, Grab bars - tub/shower, Hand held shower head   Functional History: Prior Function Prior Level of Function : Independent/Modified Independent Mobility Comments: independent, driving ADLs Comments: independent taking care of her mother with dementia, does not cook   Functional Status:  Mobility: Bed Mobility Overal bed mobility: Needs Assistance Bed Mobility: Rolling, Sidelying to Sit Rolling: Supervision Sidelying to sit: Min assist Supine to sit: Mod assist, HOB elevated, Used rails Sit to supine: +2 for safety/equipment, HOB elevated, Used rails, Contact guard assist General bed mobility comments: bed rail removed prior to transfer as pt states she does not have a bed rail at home, HOB lowered to <30 deg prior to performing; HHA for trunk lift assist during log roll to R EOB. Transfers Overall transfer level: Needs assistance Equipment used: Rolling walker (2  wheels) (bariatric) Transfers: Sit to/from Stand Sit to Stand: From elevated surface, Contact guard assist Bed to/from chair/wheelchair/BSC transfer type:: Step pivot Step pivot transfers: Max assist, +2 physical assistance, +2 safety/equipment General transfer comment: EOB> bari RW, bari BSC<>RW and bari RW>chair, cues for UE placement and safety Ambulation/Gait Ambulation/Gait assistance: Contact guard assist Gait Distance (Feet): 80 Feet Assistive device: Rolling walker (2 wheels) (bariatric) Gait Pattern/deviations: Step-through pattern, Decreased step length - right, Decreased step length - left, Trunk flexed, Wide base of support General Gait Details: 3 laps in room to door and back, then to chair near window, pt needing cues for activity pacing, posture, breathing through discomfort, RW proximity/mgmt and line assist. No buckling or overt LOB, very slow pace wtih multiple brief standing breaks to rest. Gait velocity: slowed Gait velocity interpretation: <1.31 ft/sec, indicative of household ambulator   ADL: ADL Overall ADL's : Needs assistance/impaired Grooming: Set up, Sitting Upper Body Dressing : Set up, Sitting Lower Body Dressing: Maximal assistance, Sit to/from stand Toilet Transfer: Minimal assistance, +2 for safety/equipment, Rolling walker (2 wheels), Maximal assistance, +2 for physical assistance, Regular Toilet, Ambulation, Grab bars Toilet Transfer Details (indicate cue type and reason): Min assist from bed height, max +2 from low toilet heavy reliance on GB Toileting- Clothing Manipulation and Hygiene: Supervision/safety, Sitting/lateral lean Functional mobility during ADLs: Minimal assistance, +2 for safety/equipment General ADL Comments: Decreased strength when standing from low height, poor activity tolerance   Cognition: Cognition Orientation Level: Oriented X4 Cognition Arousal: Alert Behavior During Therapy: WFL for tasks assessed/performed, Anxious    Physical Exam: Blood pressure 127/79, pulse 80, temperature 98.5 F (36.9 C), temperature source Oral, resp. rate 17, height 5' 9 (1.753 m), weight 122.3 kg, SpO2 95%. Physical Exam  General: No acute distress Mood and affect are appropriate Heart: Regular rate and rhythm no rubs murmurs or extra sounds Lungs: Clear to auscultation, breathing unlabored, no rales or wheezes Abdomen: LLQ  colostomy, abd drain in LLQ Positive bowel sounds, soft nontender to palpation, nondistended Extremities: No clubbing, cyanosis, or edema Skin: No evidence of breakdown, no evidence of rash Neurologic: Cranial nerves II through XII intact, motor strength is 4/5 in bilateral deltoid, bicep, tricep, grip, 4- / 5hip flexor, knee extensors, ankle dorsiflexor and plantar flexor Sensory exam normal sensation to light touch and proprioception in bilateral upper and lower extremities pt states she has neuropathy and her feet feel numb  Musculoskeletal: Full range of motion in all 4 extremities. No joint swelling    Lab Results Last 48 Hours        Results for orders placed or performed during the hospital encounter of 10/07/23 (from the past 48 hours)  Glucose, capillary     Status: Abnormal    Collection Time: 10/18/23 12:26 AM  Result Value Ref Range    Glucose-Capillary 122 (H) 70 - 99 mg/dL      Comment: Glucose reference range applies only to samples taken after fasting for at least 8 hours.  Comprehensive metabolic panel     Status: Abnormal    Collection Time: 10/18/23  3:20 AM  Result Value Ref Range    Sodium 136 135 - 145 mmol/L    Potassium 4.0 3.5 - 5.1 mmol/L    Chloride 103 98 - 111 mmol/L    CO2 24 22 - 32 mmol/L    Glucose, Bld 115 (H) 70 - 99 mg/dL      Comment: Glucose reference range applies only to samples taken after fasting for at least 8 hours.    BUN 23 8 - 23 mg/dL    Creatinine, Ser 8.89 (H) 0.44 - 1.00 mg/dL    Calcium  8.1 (L) 8.9 - 10.3 mg/dL    Total Protein 5.8 (L) 6.5 -  8.1 g/dL    Albumin  1.7 (L) 3.5 - 5.0 g/dL    AST 16 15 - 41 U/L    ALT 13 0 - 44 U/L    Alkaline Phosphatase 118 38 - 126 U/L    Total Bilirubin 0.5 0.0 - 1.2 mg/dL    GFR, Estimated 57 (L) >60 mL/min      Comment: (NOTE) Calculated using the CKD-EPI Creatinine Equation (2021)      Anion gap 9 5 - 15      Comment: Performed at [facility], [address], [place], [place] [address]  Magnesium      Status: None    Collection Time: 10/18/23  3:20 AM  Result Value Ref Range    Magnesium  2.1 1.7 - 2.4 mg/dL      Comment: Performed at [facility], [address], [place], [place] [address]  Phosphorus     Status: None    Collection Time: 10/18/23  3:20 AM  Result Value Ref Range    Phosphorus 4.2 2.5 - 4.6 mg/dL      Comment: Performed at [facility], [address], [place], [place] [address]  CBC     Status: Abnormal    Collection Time: 10/18/23  3:20 AM  Result Value Ref Range    WBC 11.6 (H) 4.0 - 10.5 K/uL    RBC 3.14 (L) 3.87 - 5.11 MIL/uL    Hemoglobin 8.9 (L) 12.0 - 15.0 g/dL    HCT 71.7 (L) 63.9 - 46.0 %    MCV 89.8 80.0 - 100.0 fL    MCH 28.3 26.0 - 34.0 pg    MCHC 31.6 30.0 -  36.0 g/dL    RDW 86.2 88.4 - 84.4 %    Platelets 360 150 - 400 K/uL    nRBC 0.0 0.0 - 0.2 %      Comment: Performed at [facility], [address], [place], [place] [address]  Glucose, capillary     Status: Abnormal    Collection Time: 10/18/23  6:14 AM  Result Value Ref Range    Glucose-Capillary 103 (H) 70 - 99 mg/dL      Comment: Glucose reference range applies only to samples taken after fasting for at least 8 hours.  Glucose, capillary     Status: Abnormal    Collection Time: 10/18/23  1:58 PM  Result Value Ref Range    Glucose-Capillary 118 (H) 70 - 99 mg/dL      Comment: Glucose reference range applies only to samples taken after fasting for at least 8 hours.  Basic metabolic panel with GFR     Status: Abnormal    Collection Time: 10/19/23  3:27 AM  Result  Value Ref Range    Sodium 138 135 - 145 mmol/L    Potassium 3.9 3.5 - 5.1 mmol/L    Chloride 104 98 - 111 mmol/L    CO2 23 22 - 32 mmol/L    Glucose, Bld 92 70 - 99 mg/dL      Comment: Glucose reference range applies only to samples taken after fasting for at least 8 hours.    BUN 20 8 - 23 mg/dL    Creatinine, Ser 8.99 0.44 - 1.00 mg/dL    Calcium  8.0 (L) 8.9 - 10.3 mg/dL    GFR, Estimated >39 >39 mL/min      Comment: (NOTE) Calculated using the CKD-EPI Creatinine Equation (2021)      Anion gap 11 5 - 15      Comment: Performed at [facility], [address] [place], [place] [address]  CBC     Status: Abnormal    Collection Time: 10/19/23  3:27 AM  Result Value Ref Range    WBC 11.1 (H) 4.0 - 10.5 K/uL    RBC 3.08 (L) 3.87 - 5.11 MIL/uL    Hemoglobin 8.8 (L) 12.0 - 15.0 g/dL    HCT 72.0 (L) 63.9 - 46.0 %    MCV 90.6 80.0 - 100.0 fL    MCH 28.6 26.0 - 34.0 pg    MCHC 31.5 30.0 - 36.0 g/dL    RDW 85.9 88.4 - 84.4 %    Platelets 384 150 - 400 K/uL    nRBC 0.0 0.0 - 0.2 %      Comment: Performed at [facility], [address], [place], [place] [address]  Magnesium      Status: None    Collection Time: 10/19/23  3:27 AM  Result Value Ref Range    Magnesium  1.9 1.7 - 2.4 mg/dL      Comment: Performed at [facility], [address] [place], [place] [address]  Glucose, capillary     Status: Abnormal    Collection Time: 10/19/23  6:48 AM  Result Value Ref Range    Glucose-Capillary 114 (H) 70 - 99 mg/dL      Comment: Glucose reference range applies only to samples taken after fasting for at least 8 hours.    Comment 1 Notify RN      Comment 2 Document in Chart        Imaging Results (Last 48 hours)  No results found.         Blood pressure 127/79, pulse 80, temperature 98.5 F (36.9 C), temperature source Oral, resp. rate 17, height 5' 9 (1.753 m), weight 122.3 kg, SpO2 95%.   Medical Problem List and Plan: 1. Functional deficits secondary to  debility from sepsis             -patient may not shower             -ELOS/Goals: 6-9d Mod I 2.  Antithrombotics: -DVT/anticoagulation:  Pharmaceutical: Heparin              -antiplatelet therapy: N/A 3. Pain Management: Still using IV fentanyl  only for wound vac changes             --continue tylenol  qid, Robaxin 1000 mg QID and oxycodone  prn  4. Mood/Behavior/Sleep: LCSW to follow for evaluation and support             --Trazodone prn              -antipsychotic agents: N/A 5. Neuropsych/cognition: This patient is capable of making decisions on her  own behalf. 6. Skin/Wound Care: Wound VAC change twice a week by WOC             --continue to encourage intake.  7. Fluids/Electrolytes/Nutrition: Recheck CMET in am.              --Continue Ensure BID. Add Juven 8.  Abdominal abscess: Drain in place. Cultures negative. --per  CCS to continue Zosyn /Micafungin for 14 days with EOT 10/21/23 9.  Malnutrition: Po intake poor and TPN weaned off 10/10             --continue Ensure BID 10. Hypotension: Continue midodrine 10 mg TID 11.  ANCA associated vasculitis/ILD:  Methotrexate  and Cellcept  on hold. Continue Incruse 12.  T2DM: Hgb A1C- 4.6. Monitor BS 13. Obesity: Educate on diet and exercise to help with mobility and overall health 14. H/o Depression: On prozac and Wellbutrin .       Sharlet GORMAN Schmitz, PA-C 10/19/2023

## 2023-10-20 NOTE — Discharge Summary (Signed)
 Physician Discharge Summary  [name] FMW:[id number] DOB: 1960/11/23 DOA: 10/07/2023  PCP: Lynwood Laneta ORN, PA-C  Admit date: 10/07/2023 Discharge date: 10/20/2023  Admitted From: Home Disposition: CIR  Recommendations for Outpatient Follow-up:  Follow up with PCP in 1-2 weeks Please obtain BMP/CBC in one week your next doctors visit.  For now plan is to continue inpatient medications including antibiotics. Also continue midodrine, hold off on antihypertensive which can slowly be resumed as midodrine is weaned off Requested general surgery team to continue to follow the patient at CIR Supplements to be continued   Discharge Condition: Stable CODE STATUS: Full code Diet recommendation: Diabetic  Brief/Interim Summary: Brief Narrative:   63 year old female with past medical history significant for ANCA vasculitis, chronic immunosuppression, type 2 diabetes, hypertension, hyperlipidemia, ILD and previous sigmoid colon perforation who presented to the ER via EMS with left-sided abdominal pain that began 7 days prior to admission.  On arrival patient was hypothermic and admitted with fever, tachypnea, tachycardic and hypotensive.  Workup revealed mild sigmoid colitis with perforation.  Patient admitted to ICU for septic shock.  She underwent exploratory laparotomy on 9/28, sigmoid bowel perforation with feculent peritonitis noted.  She was transferred to [facility] on 10/4.  She remains on TPN.  Has been requiring frequent pain medications and is noted to have softer blood pressures but blood pressure seems to have improved in the past several days, remains on midodrine.  Slowly she has regained her bowel function.  CT 10/6 shows multiple abscess in the abdomen, eventually drain was placed by IR on 10/7. Patient has now been weaned off TPN and doing well.  Planning for CIR today.  General surgery to continue to follow the patient.  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  Has wound VAC to abdominal wound.  Tolerating p.o., off TPN -Repeat CT abdomen/pelvis 10/6 showed multiple intraperitoneal abscess requiring drain placement by IR on 10/7. Contact IR prior to dc for follow up care arrangement - Followed by general surgery, continue anti-infectives as previously planned.  Managed by general surgery, will request them to continue to follow the patient -Will continue to wean down midodrine as able.    ANCA associated vasculitis/ILD  on methotrexate /CellCept  at baseline, currently on hold due to infection  continue incruse   Diabetes mellitus type 2  Hemoglobin A1c is 4.6 on 10/07/2023.  Blood sugars reading are in acceptable range.   NICM   On Coreg , Entresto  PTA.  Home meds can slowly be resumed depending on blood pressure.  Attempt to wean off midodrine   Borderline hyperkalemia  Will recheck labs intermittently.   Anxiety  Continue alprazolam , bupropion , fluoxetine   Physical deconditioning: PT OT following.  Admission to CIR   DVT prophylaxis: SQ Heparin     Code Status: Full Code Family Communication:   Status is: Inpatient Remains inpatient appropriate because: discharge from the hospital PT Follow up Recs: Acute Inpatient Rehab (3hours/Day)10/17/2023 1646  Subjective: Feeling well no complaints.  Tolerating p.o. better   Examination:  General exam: Appears calm and comfortable  Respiratory system: Clear to auscultation. Respiratory effort normal. Cardiovascular system: S1 & S2 heard, RRR. No JVD, murmurs, rubs, gallops or clicks. No pedal edema. Gastrointestinal system: Abdomen is nondistended, soft and nontender. No organomegaly or masses felt. Normal bowel sounds heard. Central nervous system: Alert and oriented. No focal neurological deficits. Extremities: Symmetric 5 x 5 power. Skin: Surgical  wound in the abdomen  noted, left lower quadrant drain with sanguinous fluid.  Ostomy in place Psychiatry: Judgement and insight appear normal. Mood & affect appropriate. Right upper extremity PICC line in place   Discharge Diagnoses:  Principal Problem:   Perforation of sigmoid colon - stercoral Active Problems:   Septic shock (HCC)   Postprocedural intraabdominal abscess (HCC)   Obesity (BMI 30-39.9)   Anxiety associated with depression   H/O gastroesophageal reflux (GERD)   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HCC)   Hypokalemia   ILD (interstitial lung disease) (HCC)   Physical deconditioning      Discharge Exam: Vitals:   10/20/23 0645 10/20/23 0800  BP:  (!) 106/56  Pulse:  71  Resp: 20 20  Temp:  98.8 F (37.1 C)  SpO2:  95%   Vitals:   10/19/23 2354 10/20/23 0340 10/20/23 0645 10/20/23 0800  BP: 120/61 (!) 118/57  (!) 106/56  Pulse: 74 72  71  Resp: 17 11 20 20   Temp: 98.5 F (36.9 C) 98.2 F (36.8 C)  98.8 F (37.1 C)  TempSrc: Oral Oral  Oral  SpO2: 96% 100%  95%  Weight:   122.3 kg   Height:          Discharge Instructions   Allergies as of 10/20/2023       Reactions   Mucinex [guaifenesin Er] Nausea And Vomiting   Sulfa Antibiotics Nausea And Vomiting   Glucophage [metformin] Nausea Only        Medication List     PAUSE taking these medications    methotrexate  50 MG/2ML injection Wait to take this until your doctor or other care provider tells you to start again. Inject 15 mg into the vein every Sunday.       TAKE these medications    ALPRAZolam  1 MG tablet Commonly known as: XANAX  Take 1 tablet (1 mg total) by mouth 3 (three) times daily as needed for anxiety. What changed:  how much to take when to take this additional instructions   ascorbic acid 250 MG tablet Commonly known as: VITAMIN C Take 1 tablet (250 mg total) by mouth 2 (two) times daily.   buPROPion  150 MG 24 hr tablet Commonly known as: WELLBUTRIN  XL Take 450 mg by mouth daily.    carvedilol  12.5 MG tablet Commonly known as: COREG  Take 1 tablet (12.5 mg total) by mouth 2 (two) times daily.   docusate 50 MG/5ML liquid Commonly known as: COLACE Take 10 mLs (100 mg total) by mouth 2 (two) times daily as needed for mild constipation.   ergocalciferol 1.25 MG (50000 UT) capsule Commonly known as: VITAMIN D2 Take 50,000 Units by mouth every Sunday.   feeding supplement Liqd Take 237 mLs by mouth 2 (two) times daily between meals.   lactose free nutrition Liqd Take 237 mLs by mouth 2 (two) times daily between meals.   FLUoxetine 40 MG capsule Commonly known as: PROZAC Take 40 mg by mouth daily.   folic acid  1 MG tablet Commonly known as: FOLVITE  Take 1 mg by mouth daily.   Methocarbamol 1000 MG Tabs Take 1,000 mg by mouth 4 (four) times daily.   micafungin in sodium chloride  0.9 % 100 mL Cont inpatient dose.   midodrine 10 MG tablet Commonly known as: PROAMATINE Take 1 tablet (10 mg total) by mouth 3 (three) times daily with meals.   multivitamin with minerals Tabs tablet Take 1 tablet by mouth daily.   mycophenolate  500 MG tablet Commonly known as:  CELLCEPT  Take 3 tablets (1,500 mg total) by mouth 2 (two) times daily.   nitroGLYCERIN  0.4 MG SL tablet Commonly known as: NITROSTAT  Place 1 tablet (0.4 mg total) under the tongue every 5 (five) minutes as needed for chest pain.   oxyCODONE  5 MG immediate release tablet Commonly known as: Oxy IR/ROXICODONE  Take 1 tablet (5 mg total) by mouth once for 1 dose.   Ozempic (2 MG/DOSE) 8 MG/3ML Sopn Generic drug: Semaglutide (2 MG/DOSE) Inject 2 mg into the skin every Sunday.   piperacillin-tazobactam 3.375 GM/50ML IVPB Commonly known as: ZOSYN Inject 50 mLs (3.375 g total) into the vein every 8 (eight) hours.   polyethylene glycol 17 g packet Commonly known as: MIRALAX / GLYCOLAX Take 17 g by mouth 2 (two) times daily as needed (constiaption).   pregabalin 100 MG capsule Commonly known as:  LYRICA Take 100 mg by mouth 2 (two) times daily.   sacubitril-valsartan 49-51 MG Commonly known as: Entresto Take 1 tablet by mouth 2 (two) times daily.   Spiriva Respimat 1.25 MCG/ACT Aers Generic drug: Tiotropium Bromide Inhale 2 puffs into the lungs daily. What changed:  when to take this reasons to take this   vitamin A 3 MG (10000 UNITS) capsule Take 1 capsule (10,000 Units total) by mouth daily.               Durable Medical Equipment  (From admission, onward)           Start     Ordered   10/10/23 1420  For home use only DME Bedside commode  Once       Comments: Patient is confined to one room, has Generalized Weakness and Decreased Activity Tolerance which necessitate recommendation for bedside commode.  Question:  Patient needs a bedside commode to treat with the following condition  Answer:  Weakness   10/10/23 1420   10/10/23 1420  For home use only DME Walker rolling  Once       Question Answer Comment  Walker: With 5 Inch Wheels   Patient needs a walker to treat with the following condition Gait instability      10 /01/25 1420            Follow-up Information     [facility] Follow up.   Why: Jamee) folling for [facility] needs Contact information: [address] [place] [address] [phone]         [facility] Follow up.   Why: ([facility]) has spoken with pt regarding RW/BSC- will follow up post CIR stay Contact information: [address] [place] [address] [phone]                Allergies  Allergen Reactions   Mucinex [Guaifenesin Er] Nausea And Vomiting   Sulfa Antibiotics Nausea And Vomiting   Glucophage [Metformin] Nausea Only    You were cared for by a hospitalist during your hospital stay. If you have any questions about your discharge medications or the care you received while you were in the hospital after you are discharged, you can call the unit and asked to speak with  the hospitalist on call if the hospitalist that took care of you is not available. Once you are discharged, your primary care physician will handle any further medical issues. Please note that no refills for any discharge medications will be authorized once you are discharged, as it is imperative that you return to your primary care physician (or  establish a relationship with a primary care physician if you do not have one) for your aftercare needs so that they can reassess your need for medications and monitor your lab values.  You were cared for by a hospitalist during your hospital stay. If you have any questions about your discharge medications or the care you received while you were in the hospital after you are discharged, you can call the unit and asked to speak with the hospitalist on call if the hospitalist that took care of you is not available. Once you are discharged, your primary care physician will handle any further medical issues. Please note that NO REFILLS for any discharge medications will be authorized once you are discharged, as it is imperative that you return to your primary care physician (or establish a relationship with a primary care physician if you do not have one) for your aftercare needs so that they can reassess your need for medications and monitor your lab values.  Please request your Prim.MD to go over all Hospital Tests and Procedure/Radiological results at the follow up, please get all Hospital records sent to your Prim MD by signing hospital release before you go home.  Get CBC, CMP, 2 view Chest X ray checked  by Primary MD during your next visit or SNF MD in 5-7 days ( we routinely change or add medications that can affect your baseline labs and fluid status, therefore we recommend that you get the mentioned basic workup next visit with your PCP, your PCP may decide not to get them or add new tests based on their clinical decision)  On your next visit with your primary  care physician please Get Medicines reviewed and adjusted.  If you experience worsening of your admission symptoms, develop shortness of breath, life threatening emergency, suicidal or homicidal thoughts you must seek medical attention immediately by calling 911 or calling your MD immediately  if symptoms less severe.  You Must read complete instructions/literature along with all the possible adverse reactions/side effects for all the Medicines you take and that have been prescribed to you. Take any new Medicines after you have completely understood and accpet all the possible adverse reactions/side effects.   Do not drive, operate heavy machinery, perform activities at heights, swimming or participation in water activities or provide baby sitting services if your were admitted for syncope or siezures until you have seen by Primary MD or a Neurologist and advised to do so again.  Do not drive when taking Pain medications.   Procedures/Studies: CT GUIDED SOFT TISSUE FLUID DRAIN BY PERC CATH Result Date: 10/16/2023 INDICATION: 63 year old with history of sigmoid bowel perforation with peritonitis. Status post sigmoid colon resection and colostomy. Patient has postoperative fluid collections. Patient presents for CT-guided drain placement. EXAM: CT-GUIDED PLACEMENT OF DRAINAGE CATHETER IN LEFT LOWER ABDOMINAL FLUID COLLECTION TECHNIQUE: Multidetector CT imaging of the abdomen and pelvis was performed following the standard protocol without IV contrast. RADIATION DOSE REDUCTION: This exam was performed according to the departmental dose-optimization program which includes automated exposure control, adjustment of the mA and/or kV according to patient size and/or use of iterative reconstruction technique. MEDICATIONS: Moderate sedation ANESTHESIA/SEDATION: Moderate (conscious) sedation was employed during this procedure. A total of Versed  1 mg and Fentanyl  75 mcg was administered intravenously by the  radiology nurse. Total intra-service moderate Sedation Time: 32 minutes. The patient's level of consciousness and vital signs were monitored continuously by radiology nursing throughout the procedure under my direct supervision. COMPLICATIONS: None immediate. PROCEDURE:  Informed written consent was obtained from the patient after a thorough discussion of the procedural risks, benefits and alternatives. All questions were addressed. Maximal Sterile Barrier Technique was utilized including caps, mask, sterile gowns, sterile gloves, sterile drape, hand hygiene and skin antiseptic. A timeout was performed prior to the initiation of the procedure. Patient was placed supine. CT images through the lower abdomen and pelvis were obtained. The fluid collection in the anterior left lower quadrant was identified and targeted. Left lower abdomen was prepped with chlorhexidine  and sterile field was created. Skin was anesthetized using 1% lidocaine . Small incision was made. Using CT guidance, an 18 gauge trocar needle was directed into the left lower quadrant fluid collection. Amber colored fluid was aspirated. Superstiff Amplatz wire was advanced into the collection. Tract was dilated to accommodate a 10 [place] multipurpose drain. Fluid was collected and sent for culture. Drain was sutured to skin and attached to a suction bulb. Bandage was placed. FINDINGS: 82 French drain placed within the anterior left lower quadrant collection. Majority of the fluid was aspirated. Amber colored fluid was aspirated from the drain. IMPRESSION: CT-guided placement of drainage catheter in the anterior left lower abdominal fluid collection. Electronically Signed   By: Juliene Balder M.D.   On: 10/16/2023 20:17   CT ABDOMEN PELVIS W CONTRAST Result Date: 10/15/2023 EXAM: CT ABDOMEN AND PELVIS WITH CONTRAST 10/15/2023 09:38:00 PM TECHNIQUE: CT of the abdomen and pelvis was performed with the administration of intravenous contrast, 75mL (iohexol   (OMNIPAQUE ) 350 MG/ML injection 75 mL IOHEXOL  350 MG/ML SOLN). Multiplanar reformatted images are provided for review. Automated exposure control, iterative reconstruction, and/or weight-based adjustment of the mA/kV was utilized to reduce the radiation dose to as low as reasonably achievable. COMPARISON: Comparison is made to October 07, 2023. CLINICAL HISTORY: Abdominal pain, post-operative, with anticipation of post-operative abscess. FINDINGS: LOWER CHEST: Small bilateral pleural effusions are present. Trace pericardial effusion. Cardiac size within normal limits. LIVER: The liver is unremarkable. Cholelithiasis is present without superimposed pericholecystic inflammatory change. No intra or extrahepatic biliary ductal dilation. GALLBLADDER AND BILE DUCTS: Cholelithiasis is present without superimposed pericholecystic inflammatory change. No biliary ductal dilatation. SPLEEN: No acute abnormality. PANCREAS: No acute abnormality. ADRENAL GLANDS: No acute abnormality. KIDNEYS, URETERS AND BLADDER: No stones in the kidneys or ureters. No hydronephrosis. No perinephric or periureteral stranding. Urinary bladder contains gas, which is nonspecific and may relate to recent catheterization, though aggressive infection could appear similarly. GI AND BOWEL: Interval changes of sigmoid colectomy, left midabdominal descending colostomy, and Hartmann patch formation are identified. The stomach, small bowel, and colon are otherwise unremarkable, and there is no evidence of obstruction. Punctate free intraperitoneal gas is noted within the omentum and right subdiaphragmatic region. Mesenteric edema and peritoneal enhancement are nonspecific and may represent postsurgical change and/or peritoneal inflammation. Multiple loculated rim-enhancing intraperitoneal fluid collections are identified, measuring 2.9 x 2.7 cm at image 49, series 3, and 2.2 x 3.8 cm at image 57, series 3, with growth within the deep small bowel mesentery.  Within the pelvis, 3 separate fluid collections were identified, all seen on image 72, series 3, measuring 4.5 x 7.4 cm, 3.4 x 4.5 cm, and 4.3 x 7.5 cm. The appendix is not clearly identified and may be absent. PERITONEUM AND RETROPERITONEUM: There are multiple loculated rim-enhancing intraperitoneal fluid collections and punctate free intraperitoneal gas. Mesenteric edema and peritoneal enhancement are present. VASCULATURE: Aorta is normal in caliber. LYMPH NODES: No lymphadenopathy. REPRODUCTIVE ORGANS: No acute abnormality. BONES AND SOFT TISSUES: Osseous  structures are age-appropriate. No acute bone abnormality. No lytic or blastic bone lesion. Superficial dehiscence of the midline laparotomy incision is noted. There is moderate subcutaneous edema within the flanks bilaterally. IMPRESSION: 1. Multiple loculated rim-enhancing intraperitoneal fluid collections, concerning for post-operative abscesses. 2. Punctate free intraperitoneal gas, mesenteric edema, and peritoneal enhancement, possibly representing postsurgical change and/or peritoneal inflammation. 3. Anasarca with body wall edema, Small bilateral pleural effusions and trace pericardial effusion. 4. Gas within the bladder lumen, nonspecific. Correlation with urinalysis may be helpful for further management. Electronically signed by: Dorethia Molt MD 10/15/2023 10:51 PM EDT RP Workstation: [id number]   US  EKG SITE RITE Result Date: 10/11/2023 If Site Rite image not attached, placement could not be confirmed due to current cardiac rhythm.  DG CHEST PORT 1 VIEW Result Date: 10/11/2023 EXAM: 1 VIEW(S) XRAY OF THE CHEST 10/11/2023 08:22:00 AM COMPARISON: 10/07/2023 CLINICAL HISTORY: Shortness of breath. FINDINGS: LINES, TUBES AND DEVICES: Right PICC in place with tip at the superior cavoatrial junction. LUNGS AND PLEURA: Low lung volumes. No focal pulmonary opacity. No pulmonary edema. No pleural effusion. No pneumothorax. HEART AND MEDIASTINUM: No acute  abnormality of the cardiac and mediastinal silhouettes. BONES AND SOFT TISSUES: No acute osseous abnormality. IMPRESSION: 1. No acute cardiopulmonary process. 2. Low lung volumes. Electronically signed by: Ryan Salvage MD 10/11/2023 12:09 PM EDT RP Workstation: [id number]   US  EKG SITE RITE Result Date: 10/08/2023 If Site Rite image not attached, placement could not be confirmed due to current cardiac rhythm.  DG Chest 1 View Result Date: 10/07/2023 CLINICAL DATA:  pre op EXAM: CHEST  1 VIEW COMPARISON:  July 15, 2019 FINDINGS: The cardiomediastinal silhouette is unchanged in contour. No pleural effusion. No pneumothorax. No acute pleuroparenchymal abnormality. IMPRESSION: No acute cardiopulmonary abnormality. Electronically Signed   By: Corean Salter M.D.   On: 10/07/2023 14:27   CT L-SPINE NO CHARGE Result Date: 10/07/2023 EXAM: CT OF THE LUMBAR SPINE WITH CONTRAST 10/07/2023 01:12:32 PM TECHNIQUE: CT of the lumbar spine was performed with the administration of 75 mL of iohexol  (OMNIPAQUE ) 350 MG/ML intravenous contrast. Multiplanar reformatted images are provided for review. Automated exposure control, iterative reconstruction, and/or weight based adjustment of the mA/kV was utilized to reduce the radiation dose to as low as reasonably achievable. COMPARISON: MR lumbar spine 04/09/2022. CLINICAL HISTORY: Back pain for 3 days. FINDINGS: BONES AND ALIGNMENT: Normal vertebral body heights. No acute fracture or suspicious bone lesion. Normal alignment. DEGENERATIVE CHANGES: Mild multilevel endplate degenerative changes. Posterior disc bulge/herniation is noted at L4-5 and L5-S1 SOFT TISSUES: No acute abnormality. IMPRESSION: 1. No acute fracture or suspicious bone lesion. 2. Posterior disc bulge/herniation at L4-5 and L5-S1. Electronically signed by: Waddell Calk MD 10/07/2023 01:55 PM EDT RP Workstation: [id number]   CT ABDOMEN PELVIS W CONTRAST Result Date: 10/07/2023 EXAM: CT ABDOMEN AND  PELVIS WITH CONTRAST 10/07/2023 01:12:32 PM TECHNIQUE: CT of the abdomen and pelvis was performed with the administration of 75 mL of iohexol  (OMNIPAQUE ) 350 MG/ML injection. Multiplanar reformatted images are provided for review. Automated exposure control, iterative reconstruction, and/or weight-based adjustment of the mA/kV was utilized to reduce the radiation dose to as low as reasonably achievable. COMPARISON: None available. CLINICAL HISTORY: Abdominal pain, acute, nonlocalized. FINDINGS: LOWER CHEST: No acute abnormality. LIVER: The liver is unremarkable. GALLBLADDER AND BILE DUCTS: Sludge ball versus noncalcified stone identified within the gallbladder measuring 1.9 cm, image 36/3. No biliary ductal dilatation. SPLEEN: No acute abnormality. PANCREAS: No acute abnormality. ADRENAL GLANDS: No acute abnormality. KIDNEYS, URETERS AND  BLADDER: Wall thickening involving the bladder with surrounding soft tissue stranding likely reflects secondary inflammation to changes described below. No stones in the kidneys or ureters. No hydronephrosis. No perinephric or periureteral stranding. GI AND BOWEL: Stomach is normal. No pathologic dilatation of the bowel loops. There is a moderate amount of desiccated stool which distends the lumen of the sigmoid colon. There is marked wall thickening involving the sigmoid colon with severe surrounding inflammatory fat stranding and fluid. Signs of colonic perforation identified with stool exiting the bowel and extending into the small bowel mesentery along with multiple small foci of extraluminal gas axial image 71/3. Associated inflammatory changes extend throughout the lower abdominal and pelvic . Small amount of early fluid loculation between the anterior wall of the sigmoid colon and posterior wall of bladder measuring 2.7 x 2.0 cm, image 86/3. PERITONEUM AND RETROPERITONEUM: Diffuse inflammatory changes within the abdomen and pelvis. VASCULATURE: Aortic atherosclerosis. LYMPH  NODES: Mildly enlarged lower abdominal and pelvic lymph nodes are likely reactive. REPRODUCTIVE ORGANS: No acute abnormality. BONES AND SOFT TISSUES: No acute osseous abnormality. IMPRESSION: 1. Marked sigmoid colitis with perforation, evidenced by extraluminal gas and stool extending into the small bowel mesentery, with extensive inflammatory changes throughout the lower abdominal and pelvic mesentery and peritoneal thickening. 2. Small early fluid loculation (2.7 x 2.0 cm) between the anterior sigmoid colon and posterior bladder wall. 3. Gallbladder sludge ball versus noncalcified stone (1.9 cm). 4. Aortic atherosclerotic calcifications 5. Critical results were called to the clinical service at the time of interpretation at 10/07/2023 - 1:51 pm. I spoke with John K. Lang and delivered these results. Electronically signed by: Waddell Calk MD 10/07/2023 01:52 PM [facility] Workstation: [id number]     The results of significant diagnostics from this hospitalization (including imaging, microbiology, ancillary and laboratory) are listed below for reference.     Microbiology: Recent Results (from the past 240 hours)  Aerobic/Anaerobic Culture w Gram Stain (surgical/deep wound)     Status: None (Preliminary result)   Collection Time: 10/16/23  3:09 PM   Specimen: Abdomen; Body Fluid  Result Value Ref Range Status   Specimen Description ABDOMEN FLUID  Final   Special Requests NONE  Final   Gram Stain   Final    RARE WBC PRESENT, PREDOMINANTLY PMN NO ORGANISMS SEEN    Culture   Final    NO GROWTH 3 DAYS NO ANAEROBES ISOLATED; CULTURE IN PROGRESS FOR 5 DAYS Performed at [facility], [address] [place], [place] [address]    Report Status PENDING  Incomplete     Labs: BNP (last 3 results) No results for input(s): BNP in the last 8760 hours. Basic Metabolic Panel: Recent Labs  Lab 10/14/23 0500 10/15/23 1026 10/18/23 0320 10/19/23 0327 10/20/23 0401  NA 137 136 136 138 138   K 5.0 4.2 4.0 3.9 4.0  CL 104 105 103 104 104  CO2 23 25 24 23 24   GLUCOSE 112* 127* 115* 92 81  BUN 26* 23 23 20 19   CREATININE 0.96 0.95 1.10* 1.00 0.98  CALCIUM  8.1* 8.1* 8.1* 8.0* 8.1*  MG 1.9 1.9 2.1 1.9 1.9  PHOS 3.9 3.4 4.2  --   --    Liver Function Tests: Recent Labs  Lab 10/15/23 1026 10/18/23 0320  AST 15 16  ALT 10 13  ALKPHOS 85 118  BILITOT 0.6 0.5  PROT 6.3* 5.8*  ALBUMIN  1.7* 1.7*   No results for input(s): LIPASE, AMYLASE in the last 168 hours.  No results for input(s): AMMONIA in the last 168 hours. CBC: Recent Labs  Lab 10/15/23 1026 10/17/23 0610 10/18/23 0320 10/19/23 0327 10/20/23 0401  WBC 13.7* 14.0* 11.6* 11.1* 11.9*  NEUTROABS  --  10.8*  --   --   --   HGB 9.7* 8.8* 8.9* 8.8* 8.6*  HCT 31.4* 27.9* 28.2* 27.9* 27.9*  MCV 90.2 89.4 89.8 90.6 90.6  PLT 304 335 360 384 417*   Cardiac Enzymes: No results for input(s): CKTOTAL, CKMB, CKMBINDEX, TROPONINI in the last 168 hours. BNP: Invalid input(s): POCBNP CBG: Recent Labs  Lab 10/18/23 0026 10/18/23 0614 10/18/23 1358 10/19/23 0648 10/19/23 1442  GLUCAP 122* 103* 118* 114* 156*   D-Dimer No results for input(s): DDIMER in the last 72 hours. Hgb A1c No results for input(s): HGBA1C in the last 72 hours. Lipid Profile No results for input(s): CHOL, HDL, LDLCALC, TRIG, CHOLHDL, LDLDIRECT in the last 72 hours. Thyroid function studies No results for input(s): TSH, T4TOTAL, T3FREE, THYROIDAB in the last 72 hours.  Invalid input(s): FREET3 Anemia work up No results for input(s): VITAMINB12, FOLATE, FERRITIN, TIBC, IRON, RETICCTPCT in the last 72 hours. Urinalysis    Component Value Date/Time   COLORURINE AMBER (A) 10/07/2023 1224   APPEARANCEUR CLOUDY (A) 10/07/2023 1224   LABSPEC 1.019 10/07/2023 1224   PHURINE 5.0 10/07/2023 1224   GLUCOSEU NEGATIVE 10/07/2023 1224   HGBUR NEGATIVE 10/07/2023 1224   BILIRUBINUR SMALL (A)  10/07/2023 1224   KETONESUR 5 (A) 10/07/2023 1224   PROTEINUR 100 (A) 10/07/2023 1224   UROBILINOGEN 0.2 07/09/2013 1220   NITRITE NEGATIVE 10/07/2023 1224   LEUKOCYTESUR NEGATIVE 10/07/2023 1224   Sepsis Labs Recent Labs  Lab 10/17/23 0610 10/18/23 0320 10/19/23 0327 10/20/23 0401  WBC 14.0* 11.6* 11.1* 11.9*   Microbiology Recent Results (from the past 240 hours)  Aerobic/Anaerobic Culture w Gram Stain (surgical/deep wound)     Status: None (Preliminary result)   Collection Time: 10/16/23  3:09 PM   Specimen: Abdomen; Body Fluid  Result Value Ref Range Status   Specimen Description ABDOMEN FLUID  Final   Special Requests NONE  Final   Gram Stain   Final    RARE WBC PRESENT, PREDOMINANTLY PMN NO ORGANISMS SEEN    Culture   Final    NO GROWTH 3 DAYS NO ANAEROBES ISOLATED; CULTURE IN PROGRESS FOR 5 DAYS Performed at [facility], [address], [place], [place] [address]    Report Status PENDING  Incomplete     Time coordinating discharge:  I have spent 35 minutes face to face with the patient and on the ward discussing the patients care, assessment, plan and disposition with other care givers. >50% of the time was devoted counseling the patient about the risks and benefits of treatment/Discharge disposition and coordinating care.   SIGNED:   Burgess JAYSON Dare, MD  [facility] 10/20/2023, 10:22 AM   If 7PM-7AM, please contact night-coverage

## 2023-10-21 DIAGNOSIS — Z794 Long term (current) use of insulin: Secondary | ICD-10-CM

## 2023-10-21 DIAGNOSIS — E119 Type 2 diabetes mellitus without complications: Secondary | ICD-10-CM

## 2023-10-21 DIAGNOSIS — Z933 Colostomy status: Secondary | ICD-10-CM

## 2023-10-21 DIAGNOSIS — F411 Generalized anxiety disorder: Secondary | ICD-10-CM

## 2023-10-21 DIAGNOSIS — G4700 Insomnia, unspecified: Secondary | ICD-10-CM

## 2023-10-21 DIAGNOSIS — I959 Hypotension, unspecified: Secondary | ICD-10-CM

## 2023-10-21 LAB — AEROBIC/ANAEROBIC CULTURE W GRAM STAIN (SURGICAL/DEEP WOUND): Culture: NO GROWTH

## 2023-10-21 LAB — MAGNESIUM: Magnesium: 2 mg/dL (ref 1.7–2.4)

## 2023-10-21 MED ORDER — VITAMIN C 500 MG PO TABS
250.0000 mg | ORAL_TABLET | Freq: Two times a day (BID) | ORAL | Status: DC
  Administered 2023-10-21 – 2023-10-28 (×14): 250 mg via ORAL
  Filled 2023-10-21 (×14): qty 1

## 2023-10-21 MED ORDER — ALPRAZOLAM 0.25 MG PO TABS
0.5000 mg | ORAL_TABLET | Freq: Two times a day (BID) | ORAL | Status: DC | PRN
  Administered 2023-10-26: 0.5 mg via ORAL
  Filled 2023-10-21: qty 2

## 2023-10-21 MED ORDER — ACETAMINOPHEN 325 MG PO TABS: 325.0000 mg | ORAL_TABLET | Freq: Four times a day (QID) | ORAL | Status: DC | PRN

## 2023-10-21 NOTE — Progress Notes (Signed)
 Physical Medicine and Rehabilitation Consult Reason for Consult: Evaluate appropriateness for Inpatient Rehab Referring Physician: Dr. Caleen       HPI: [name] is a 63 y.o. 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), Diverticulitis of large intestine with perforation (09/23/2012), Dyspnea (05/11/2014), GERD (gastroesophageal reflux disease), Hemoptysis (06/23/2013), History of endometriosis (1997), tobacco use, presenting hazards to health (09/23/2012), Hyperglycemia (07/24/2013), Hypertension, Hypokalemia (07/20/2013), ILD (interstitial lung disease) (HCC) (01/22/2014), Leukocytosis (05/11/2014), MPA (microscopic polyangiitis) (HCC) (2015), Numbness, Obesity (BMI 30-39.9) (09/23/2012), Paresthesia (12/26/2016), Perforation of sigmoid colon - stercoral (09/27/2012), Physical deconditioning (04/30/2015), Pulmonary alveolar hemorrhage (09/03/2013), Respiratory failure with hypoxia (HCC) (06/23/2013), and Right leg swelling (12/26/2016). . They were admitted to [facility] on 10/07/2023 for sepsis, CT abdomen and pelvis showed sigmoid bowel perforation with peritonitis secondary to diverticulitis.  She was started on IV Zosyn , vancomycin, and antifungal given chronic immunosuppression from ANCA vasculitis on CellCept , and taken urgently to the OR for ex lap and colostomy placement by Dr. Rubin.  Follow-up CT scan 10-6 showed multiple pelvic abscesses, IR placed percutaneous drain 10-7.  Cultures negative thus far.  She remains on IV Zosyn   and micafungin, with a stop date of 14 days on 10-12.  She is currently weaning TNA and tolerating a soft diet.  Hospitalization has been otherwise complicated by complex postsurgical wound with wound VAC changed Monday-Thursday, urinary incontinence with pure wick, type 2 diabetes, ANCA vasculitis/ILD with immunosuppressive's health, NICM, hyperkalemia, poorly controlled pain, hypotension on midodrine, and anemia.  PM&R was consulted to evaluate appropriateness for IPR admission.      Per record, at baseline she lives in a private residence, planning to go to her sisters home on discharge with 24/7 help available by her sister.  This is a double wide home with a level entry.  Prior to admission, she was independent of mobility and ADLs, and taking care of her mother with dementia at her sister's home while her husband is away (he is a truck driver gone 25 days a month).  Currently, she is min to max assist +2 for transfers, walking 75 feet with a bariatric rollator at a min assist +2 level, set up assist for upper body ADLs and max assist for lower body ADLs.  She is limited by fatigue, decreased strength when standing from a low height, and poor safety awareness/cueing.   Review of Systems  Constitutional:  Negative for chills and fever.  Cardiovascular:  Negative for chest pain.  Gastrointestinal:  Positive for abdominal pain and nausea.  Genitourinary:  Positive for urgency.  Musculoskeletal:  Negative for joint pain and myalgias.  Neurological:  Positive for dizziness and weakness. Negative for sensory change and headaches.  Psychiatric/Behavioral:  Positive for depression.        Past Medical History:  Diagnosis Date   Acute blood loss anemia 06/23/2013   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HCC)     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HCC) 06/24/2013   Anxiety associated with depression 09/23/2012   At high risk for falls 09/23/2012    She has had a fractured ankle and tendon tear with falls over the last  couple years.   Cardiomyopathy (HCC)  Chronic fatigue 05/11/2014   Chronic right-sided low back pain with right-sided sciatica 12/26/2016   Colitis     Constipation, chronic 09/27/2012   Diabetes mellitus without complication (HCC)     Diffuse pulmonary alveolar hemorrhage 06/23/2013   Diverticulitis of large intestine with perforation 09/23/2012   Dyspnea 05/11/2014   GERD (gastroesophageal reflux disease)     Hemoptysis 06/23/2013   History of endometriosis 1997   Hx of tobacco use, presenting hazards to health 09/23/2012   Hyperglycemia 07/24/2013   Hypertension     Hypokalemia 07/20/2013   ILD (interstitial lung disease) (HCC) 01/22/2014   Leukocytosis 05/11/2014   MPA (microscopic polyangiitis) (HCC) 2015   Numbness     Obesity (BMI 30-39.9) 09/23/2012   Paresthesia 12/26/2016   Perforation of sigmoid colon - stercoral 09/27/2012   Physical deconditioning 04/30/2015   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   Respiratory failure with hypoxia (HCC) 06/23/2013   Right leg swelling 12/26/2016             Past Surgical History:  Procedure Laterality Date   APPENDECTOMY       BLADDER REPAIR       BOWEL RESECTION N/A 10/07/2023    Procedure: EXCISION, SMALL INTESTINE;  Surgeon: Rubin Calamity, MD;  Location: [facility] OR;  Service: General;  Laterality: N/A;   DILATATION & CURETTAGE/HYSTEROSCOPY WITH MYOSURE N/A 06/03/2019    Procedure: DILATATION & CURETTAGE/HYSTEROSCOPY WITH MYOSURE;  Surgeon: Curlene Agent, MD;  Location: [facility] OR;  Service: Gynecology;  Laterality: N/A;   IR CATHETER TUBE CHANGE   06/24/2019   IR FLUORO GUIDE CV LINE RIGHT   06/16/2019   IR RADIOLOGIST EVAL & MGMT   07/29/2019   IR US  GUIDE VASC ACCESS RIGHT   06/16/2019   LAPAROSCOPY   1997    dx of endometriosis   LAPAROSCOPY   04/28/2000     Laparoscopy with lysis of adhesions, hysteroscopy, D&C.   LAPAROTOMY N/A 10/07/2023    Procedure: LAPAROTOMY, EXPLORATORY;  Surgeon: Rubin Calamity, MD;  Location: [facility] OR;  Service:  General;  Laterality: N/A;   TONSILLECTOMY                 Family History  Problem Relation Age of Onset   Diabetes Mother          AODM   Diabetes Father          AODM   Hypertension Father     Obesity Father     Breast cancer Paternal Grandmother     Birth defects Cousin     CAD Other          [facility]        Social History:  reports that she quit smoking about 22 years ago. Her smoking use included cigarettes. She started smoking about 49 years ago. She has a 27 pack-year smoking history. She has never used smokeless tobacco. She reports that she does not drink alcohol and does not use drugs. Allergies:  Allergies      Allergies  Allergen Reactions   Mucinex [Guaifenesin Er] Nausea And Vomiting   Sulfa Antibiotics Nausea And Vomiting   Glucophage [Metformin] Nausea Only            Medications Prior to Admission  Medication Sig Dispense Refill   ALPRAZolam  (XANAX ) 1 MG tablet Take 1 tablet (1 mg total) by mouth 3 (three) times daily as needed for anxiety. (Patient taking differently: Take 0.5-1.5 mg by mouth See admin instructions. Take 1-1.5 tablets (1-1.5mg ) by  mouth every night at bedtime and take 0.5-1 tablets (0.5-1mg ) twice daily as needed for anxiety.) 5 tablet 0   buPROPion  (WELLBUTRIN  XL) 150 MG 24 hr tablet Take 450 mg by mouth daily.       carvedilol  (COREG ) 12.5 MG tablet Take 1 tablet (12.5 mg total) by mouth 2 (two) times daily. 180 tablet 3   ergocalciferol (VITAMIN D2) 1.25 MG (50000 UT) capsule Take 50,000 Units by mouth every Sunday.       FLUoxetine (PROZAC) 40 MG capsule Take 40 mg by mouth daily.       folic acid  (FOLVITE ) 1 MG tablet Take 1 mg by mouth daily.        methotrexate  50 MG/2ML injection Inject 15 mg into the vein every Sunday.       mycophenolate  (CELLCEPT ) 500 MG tablet Take 3 tablets (1,500 mg total) by mouth 2 (two) times daily.       nitroGLYCERIN  (NITROSTAT ) 0.4 MG SL tablet Place 1 tablet (0.4 mg total) under the tongue every 5 (five)  minutes as needed for chest pain. 90 tablet 3   polyethylene glycol (MIRALAX  / GLYCOLAX ) packet Take 17 g by mouth 2 (two) times daily as needed (constiaption).       sacubitril -valsartan  (ENTRESTO ) 49-51 MG Take 1 tablet by mouth 2 (two) times daily. 180 tablet 3   Semaglutide, 2 MG/DOSE, (OZEMPIC, 2 MG/DOSE,) 8 MG/3ML SOPN Inject 2 mg into the skin every Sunday.       Tiotropium Bromide Monohydrate  (SPIRIVA  RESPIMAT) 1.25 MCG/ACT AERS Inhale 2 puffs into the lungs daily. (Patient taking differently: Inhale 2 puffs into the lungs daily as needed (wheezing shortness of breath).) 12 g 3   pregabalin (LYRICA) 100 MG capsule Take 100 mg by mouth 2 (two) times daily. (Patient not taking: Reported on 10/09/2023)              Home: Home Living Family/patient expects to be discharged to:: Private residence Living Arrangements: Other (Comment) (going to stay at her sisters house) Available Help at Discharge: Family, Available 24 hours/day (sister) Type of Home: Mobile home Home Access: Level entry Home Layout: One level Bathroom Shower/Tub: Engineer, manufacturing systems: Standard Bathroom Accessibility: Yes Home Equipment: Tub bench, Grab bars - tub/shower, Hand held shower head  Functional History: Prior Function Prior Level of Function : Independent/Modified Independent Mobility Comments: independent, driving ADLs Comments: independent taking care of her mother with dementia, does not cook Functional Status:  Mobility: Bed Mobility Overal bed mobility: Needs Assistance Bed Mobility: Supine to Sit Supine to sit: Mod assist, HOB elevated, Used rails Sit to supine: +2 for safety/equipment, HOB elevated, Used rails, Contact guard assist General bed mobility comments: pt educated on abdominal protection with log roll technique but pt declines voicing Let me just show you how I do it, need up to mod assist for trunk support and therapist encouraging partial roll to protect  abd. Transfers Overall transfer level: Needs assistance Equipment used: Rolling walker (2 wheels) (bariatric) Transfers: Sit to/from Stand Sit to Stand: From elevated surface, Min assist, +2 safety/equipment, Mod assist Bed to/from chair/wheelchair/BSC transfer type:: Step pivot Step pivot transfers: Max assist, +2 physical assistance, +2 safety/equipment General transfer comment: from EOB with min assist, from low toilet seat with wall rail and +2 modA to RW after x 5 unsuccessful attempts with +1 min to modA. RW>chair with cues for safe UE placement. Ambulation/Gait Ambulation/Gait assistance: Contact guard assist, Min assist, +2 safety/equipment Gait Distance (Feet): 75 Feet Assistive  device: Rolling walker (2 wheels) (bariatric) Gait Pattern/deviations: Step-through pattern, Decreased step length - right, Decreased step length - left, Trunk flexed, Wide base of support General Gait Details: CGA to Min physical assist for RW management and steadying, esp when using rollator; ~30ft to bathroom with bari RW, then ~15ft with bari rollator. Second person for line assist and preparing to offer chair follow PRN for safety; vc for sequencing, posture and activity pacing. Pt reports bari RW feels more stable to her than rollator, case mgmt notified after session. Gait velocity: slowed Gait velocity interpretation: <1.31 ft/sec, indicative of household ambulator   ADL: ADL Overall ADL's : Needs assistance/impaired Grooming: Set up, Sitting Upper Body Dressing : Set up, Sitting Lower Body Dressing: Maximal assistance, Sit to/from stand Toilet Transfer: Minimal assistance, +2 for safety/equipment, Rolling walker (2 wheels), Maximal assistance, +2 for physical assistance, Regular Toilet, Ambulation, Grab bars Toilet Transfer Details (indicate cue type and reason): Min assist from bed height, max +2 from low toilet heavy reliance on GB Toileting- Clothing Manipulation and Hygiene: Supervision/safety,  Sitting/lateral lean Functional mobility during ADLs: Minimal assistance, +2 for safety/equipment General ADL Comments: Decreased strength when standing from low height, poor activity tolerance   Cognition: Cognition Orientation Level: Oriented X4 Cognition Arousal: Alert Behavior During Therapy: Anxious   Blood pressure 95/73, pulse 74, temperature 97.7 F (36.5 C), temperature source Oral, resp. rate 14, height 5' 9 (1.753 m), weight 122.3 kg, SpO2 97%. Physical Exam   PE: Constitution: Appropriate appearance for age. No apparent distress  +Obese Resp: No respiratory distress. No accessory muscle usage. on RA and CTAB Cardio: Well perfused appearance. No peripheral edema. Abdomen: +BS, hypoactive. Diffusely tender, no rebound or point tenderness. +Ostomy with moderate brown, liquid OP.  Psych: Appropriate mood and affect. +Depression; no SI/HI Neuro: AAOx4. No apparent cognitive deficits    Neurologic Exam:   DTRs: Reflexes were 2+ in bilateral achilles, patella, biceps, BR and triceps. Babinsky: flexor responses b/l.   Hoffmans: negative b/l Sensory exam: revealed normal sensation in all dermatomal regions in bilateral upper extremities and with reduced sensation to light touch in bilateral feet extending to the proximal ankle      Strength:                RUE: 4/5 SA, 5/5 EF, 5/5 EE, 5/5 WE, 5/5 FF, 5/5 FA                LUE:  4/5 SA, 5/5 EF, 5/5 EE, 5/5 WE, 5/5 FF, 5/5 FA                RLE: 3/5 HF, 4/5 KE, 4/5  DF, 4/5  EHL, 4/5  PF                 LLE:  3/5 HF, 4/5 KE, 5/5  DF, 5/5  EHL, 5/5  PF   Coordination: Fine motor coordination was normal.         Lab Results Last 24 Hours       Results for orders placed or performed during the hospital encounter of 10/07/23 (from the past 24 hours)  Glucose, capillary     Status: Abnormal    Collection Time: 10/17/23 10:30 AM  Result Value Ref Range    Glucose-Capillary 105 (H) 70 - 99 mg/dL  Glucose, capillary     Status:  Abnormal    Collection Time: 10/18/23 12:26 AM  Result Value Ref Range    Glucose-Capillary 122 (H) 70 -  99 mg/dL  Comprehensive metabolic panel     Status: Abnormal    Collection Time: 10/18/23  3:20 AM  Result Value Ref Range    Sodium 136 135 - 145 mmol/L    Potassium 4.0 3.5 - 5.1 mmol/L    Chloride 103 98 - 111 mmol/L    CO2 24 22 - 32 mmol/L    Glucose, Bld 115 (H) 70 - 99 mg/dL    BUN 23 8 - 23 mg/dL    Creatinine, Ser 8.89 (H) 0.44 - 1.00 mg/dL    Calcium  8.1 (L) 8.9 - 10.3 mg/dL    Total Protein 5.8 (L) 6.5 - 8.1 g/dL    Albumin  1.7 (L) 3.5 - 5.0 g/dL    AST 16 15 - 41 U/L    ALT 13 0 - 44 U/L    Alkaline Phosphatase 118 38 - 126 U/L    Total Bilirubin 0.5 0.0 - 1.2 mg/dL    GFR, Estimated 57 (L) >60 mL/min    Anion gap 9 5 - 15  Magnesium      Status: None    Collection Time: 10/18/23  3:20 AM  Result Value Ref Range    Magnesium  2.1 1.7 - 2.4 mg/dL  Phosphorus     Status: None    Collection Time: 10/18/23  3:20 AM  Result Value Ref Range    Phosphorus 4.2 2.5 - 4.6 mg/dL  CBC     Status: Abnormal    Collection Time: 10/18/23  3:20 AM  Result Value Ref Range    WBC 11.6 (H) 4.0 - 10.5 K/uL    RBC 3.14 (L) 3.87 - 5.11 MIL/uL    Hemoglobin 8.9 (L) 12.0 - 15.0 g/dL    HCT 71.7 (L) 63.9 - 46.0 %    MCV 89.8 80.0 - 100.0 fL    MCH 28.3 26.0 - 34.0 pg    MCHC 31.6 30.0 - 36.0 g/dL    RDW 86.2 88.4 - 84.4 %    Platelets 360 150 - 400 K/uL    nRBC 0.0 0.0 - 0.2 %  Glucose, capillary     Status: Abnormal    Collection Time: 10/18/23  6:14 AM  Result Value Ref Range    Glucose-Capillary 103 (H) 70 - 99 mg/dL       Imaging Results (Last 48 hours)  CT GUIDED SOFT TISSUE FLUID DRAIN BY PERC CATH Result Date: 10/16/2023 INDICATION: 63 year old with history of sigmoid bowel perforation with peritonitis. Status post sigmoid colon resection and colostomy. Patient has postoperative fluid collections. Patient presents for CT-guided drain placement. EXAM: CT-GUIDED PLACEMENT  OF DRAINAGE CATHETER IN LEFT LOWER ABDOMINAL FLUID COLLECTION TECHNIQUE: Multidetector CT imaging of the abdomen and pelvis was performed following the standard protocol without IV contrast. RADIATION DOSE REDUCTION: This exam was performed according to the departmental dose-optimization program which includes automated exposure control, adjustment of the mA and/or kV according to patient size and/or use of iterative reconstruction technique. MEDICATIONS: Moderate sedation ANESTHESIA/SEDATION: Moderate (conscious) sedation was employed during this procedure. A total of Versed  1 mg and Fentanyl  75 mcg was administered intravenously by the radiology nurse. Total intra-service moderate Sedation Time: 32 minutes. The patient's level of consciousness and vital signs were monitored continuously by radiology nursing throughout the procedure under my direct supervision. COMPLICATIONS: None immediate. PROCEDURE: Informed written consent was obtained from the patient after a thorough discussion of the procedural risks, benefits and alternatives. All questions were addressed. Maximal Sterile Barrier Technique was utilized including caps,  mask, sterile gowns, sterile gloves, sterile drape, hand hygiene and skin antiseptic. A timeout was performed prior to the initiation of the procedure. Patient was placed supine. CT images through the lower abdomen and pelvis were obtained. The fluid collection in the anterior left lower quadrant was identified and targeted. Left lower abdomen was prepped with chlorhexidine  and sterile field was created. Skin was anesthetized using 1% lidocaine . Small incision was made. Using CT guidance, an 18 gauge trocar needle was directed into the left lower quadrant fluid collection. Amber colored fluid was aspirated. Superstiff Amplatz wire was advanced into the collection. Tract was dilated to accommodate a 10 [place] multipurpose drain. Fluid was collected and sent for culture. Drain was sutured to  skin and attached to a suction bulb. Bandage was placed. FINDINGS: 68 French drain placed within the anterior left lower quadrant collection. Majority of the fluid was aspirated. Amber colored fluid was aspirated from the drain. IMPRESSION: CT-guided placement of drainage catheter in the anterior left lower abdominal fluid collection. Electronically Signed   By: Juliene Balder M.D.   On: 10/16/2023 20:17       Assessment/Plan: Diagnosis: Debility due to bowel perforation s/p exlap and ostomy by Dr. Rubin 9/8 Does the need for close, 24 hr/day medical supervision in concert with the patient's rehab needs make it unreasonable for this patient to be served in a less intensive setting? Yes Co-Morbidities requiring supervision/potential complications:  Malnutrition on TNA +  soft diet, complex postsurgical wounds with wound VAC and JP drain, urinary incontinence with pure wick, type 2 diabetes, ANCA vasculitis/ILD with immunosuppressive's health, NICM, hyperkalemia, poorly controlled pain, hypotension on midodrine, RLE radiculopathy with weakness, and anemia Due to bladder management, bowel management, safety, skin/wound care, disease management, medication administration, pain management, and patient education, does the patient require 24 hr/day rehab nursing? Yes Does the patient require coordinated care of a physician, rehab nurse, therapy disciplines of PT, OT to address physical and functional deficits in the context of the above medical diagnosis(es)? Yes Addressing deficits in the following areas: balance, endurance, locomotion, strength, transferring, bowel/bladder control, bathing, dressing, grooming, and toileting Can the patient actively participate in an intensive therapy program of at least 3 hrs of therapy per day at least 5 days per week? Yes The potential for patient to make measurable gains while on inpatient rehab is good Anticipated functional outcomes upon discharge from inpatient rehab are  supervision  with PT, supervision with OT Estimated rehab length of stay to reach the above functional goals is: 10-14 days Anticipated discharge destination: Home Overall Rehab/Functional Prognosis: good   POST ACUTE RECOMMENDATIONS: This patient's condition is appropriate for continued rehabilitative care in the following setting: CIR Patient has agreed to participate in recommended program. Potentially - she prefers DC home with H/H if at all feasible but will consider IRF if needed; will not consider SNF Note that insurance prior authorization may be required for reimbursement for recommended care.     MEDICAL RECOMMENDATIONS: Please have WOC and nursing start to train patient on ostomy management  / changing as part of self-cares Work on weaning off TPN - will ideally be on PO diet for rehab qualification Will need to be off IV pain medications for rehab - currently not using IV fentanyl  so would recommend DC Chronic urinary urgency  - would consider PVRs to ensure no overflow and timed toiletting to promote continence; may need medication Please establish VAC management plan for OP - duration needed and if changes will  be done in surgeon's office vs. OP wound clinic (very limited availability).      I have personally performed a face to face diagnostic evaluation of this patient. Additionally, I have examined the patient's medical record including any pertinent labs and radiographic images. If the physician assistant has documented in this note, I have reviewed and edited or otherwise concur with the physician assistant's documentation.   Thanks,   Joesph JAYSON Likes, DO

## 2023-10-21 NOTE — Evaluation (Signed)
 Occupational Therapy Assessment and Plan  Patient Details  Name: [name] MRN: [id number] Date of Birth: 10-29-1960  OT Diagnosis: acute pain and muscle weakness (generalized) Rehab Potential: Rehab Potential (ACUTE ONLY): Good ELOS: ~6-9days   Today's Date: 10/21/2023 OT Individual Time:  - 730-845 (75 min )        Hospital Problem: Principal Problem:   Debility   Past Medical History:  Past Medical History:  Diagnosis Date   Acute blood loss anemia 06/23/2013   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HCC)    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HCC) 06/24/2013   Anxiety associated with depression 09/23/2012   At high risk for falls 09/23/2012   She has had a fractured ankle and tendon tear with falls over the last couple years.   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   Diverticulitis of large intestine with perforation 09/23/2012   Dyspnea 05/11/2014   GERD (gastroesophageal reflux disease)    Hemoptysis 06/23/2013   History of endometriosis 1997   Hx of tobacco use, presenting hazards to health 09/23/2012   Hyperglycemia 07/24/2013   Hypertension    Hypokalemia 07/20/2013   ILD (interstitial lung disease) (HCC) 01/22/2014   Leukocytosis 05/11/2014   MPA (microscopic polyangiitis) (HCC) 2015   Numbness    Obesity (BMI 30-39.9) 09/23/2012   Paresthesia 12/26/2016   Perforation of sigmoid colon - stercoral 09/27/2012   Physical deconditioning 04/30/2015   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   Respiratory failure with hypoxia (HCC) 06/23/2013   Right leg swelling 12/26/2016   Past Surgical History:  Past Surgical History:  Procedure Laterality Date   APPENDECTOMY     BLADDER REPAIR     BOWEL RESECTION N/A 10/07/2023   Procedure: EXCISION, SMALL INTESTINE;  Surgeon:  Rubin Calamity, MD;  Location: [facility] OR;  Service: General;  Laterality: N/A;   DILATATION & CURETTAGE/HYSTEROSCOPY WITH MYOSURE N/A 06/03/2019   Procedure: DILATATION & CURETTAGE/HYSTEROSCOPY WITH MYOSURE;  Surgeon: Curlene Agent, MD;  Location: [facility] OR;  Service: Gynecology;  Laterality: N/A;   IR CATHETER TUBE CHANGE  06/24/2019   IR FLUORO GUIDE CV LINE RIGHT  06/16/2019   IR RADIOLOGIST EVAL & MGMT  07/29/2019   IR US  GUIDE VASC ACCESS RIGHT  06/16/2019   LAPAROSCOPY  1997   dx of endometriosis   LAPAROSCOPY  04/28/2000    Laparoscopy with lysis of adhesions, hysteroscopy, D&C.   LAPAROTOMY N/A 10/07/2023   Procedure: LAPAROTOMY, EXPLORATORY;  Surgeon: Rubin Calamity, MD;  Location: [facility] OR;  Service: General;  Laterality: N/A;   TONSILLECTOMY      Assessment & Plan Clinical Impression: Patient is a 63 y.o. year old female history of ANCA associated vasculitis, anxiety, depression T2DM, chronic fatigue, colitis, DPH, HTN, LBP with sciatica, ILD, microscopic polyangiitis, paresthesias, stercoral perforation of sigmoid colon who was admitted on 10/28 with 3 week history of constipation. She presented to ED with abdominal pain,  2 day history of decrease in UOP and signs of early sepsi. She was found to have sigmoid colitis with bowel perforation with peritonitis. She was taken to OR emergently for Exp Lap with sigmoid resection and Hartman's colostomy 9/28 by Dr. Rubin.  Post op placed on broad spectrum antibiotics, pressors and had issues with pain control as well as anxiety. Post op started on TNA for nutritional  support due to ileus, had issues with pain control as well as delirium.    Follow up CT A/P 10/6 showed multiple pelvic abscess and Dr. Wendolyn consulted for drain placement on 10/7. Fluid  cultures done negative so far and surgery recommends Zosyn  and Micafungin to continue for 2 week course and white count improving. She continues on midodrine for BP support. PT/OT has been working with  patient who is limited by pain, weakness and requires multiple rest breaks with minimal activity. 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 CIR recommended due to functional decline. Patient transferred to CIR on 10/20/2023 .    Patient currently requires min with basic self-care skills secondary to muscle weakness, decreased cardiorespiratoy endurance and acute pain, and decreased standing balance and difficulty maintaining precautions.  Prior to hospitalization, patient could complete ADL with modified independent .  Patient will benefit from skilled intervention to decrease level of assist with basic self-care skills and increase independence with basic self-care skills prior to discharge home with care partner.  Anticipate patient will require intermittent supervision and no further OT follow recommended.  OT - End of Session Activity Tolerance: Tolerates 10 - 20 min activity with multiple rests Endurance Deficit: Yes OT Assessment Rehab Potential (ACUTE ONLY): Good OT Barriers to Discharge: IV antibiotics OT Patient demonstrates impairments in the following area(s): Balance;Endurance;Motor;Safety;Pain OT Basic ADL's Functional Problem(s): Bathing;Dressing;Toileting OT Advanced ADL's Functional Problem(s): None OT Transfers Functional Problem(s): Toilet OT Additional Impairment(s): None OT Plan OT Intensity: Minimum of 1-2 x/day, 45 to 90 minutes OT Frequency: 5 out of 7 days OT Duration/Estimated Length of Stay: ~6-9days OT Treatment/Interventions: Balance/vestibular training;Disease mangement/prevention;Self Care/advanced ADL retraining;Therapeutic Exercise;DME/adaptive equipment instruction;Pain management;Skin care/wound managment;UE/LE Strength taining/ROM;Patient/family education;Splinting/orthotics;UE/LE Coordination activities;Discharge planning;Functional mobility training;Psychosocial support;Therapeutic Activities OT Self Feeding Anticipated Outcome(s): n/a OT Basic  Self-Care Anticipated Outcome(s): mod I OT Toileting Anticipated Outcome(s): mod I OT Bathroom Transfers Anticipated Outcome(s): mod I OT Recommendation Recommendations for Other Services: None Patient destination: Home Follow Up Recommendations: None Equipment Recommended: To be determined   OT Evaluation Precautions/Restrictions  Precautions Precautions: Fall Precaution/Restrictions Comments: Wound vac, abdominal protection precs; LLQ JP drain Restrictions Weight Bearing Restrictions Per Provider Order: No General Chart Reviewed: Yes Family/Caregiver Present: No Vital Signs   Pain Pain Assessment Pain Scale: 0-10 Pain Score: 7  Pain Location: Abdomen Pain Intervention(s): Medication (See eMAR) Home Living/Prior Functioning Home Living Family/patient expects to be discharged to:: Private residence Living Arrangements: Other relatives Available Help at Discharge: (P) Family, Available 24 hours/day Type of Home: (P) Mobile home Home Access: (P) Level entry Home Layout: (P) One level Bathroom Shower/Tub: (P) Tub/shower unit Bathroom Toilet: (P) Standard  Lives With: (P) Spouse, Family Vision Baseline Vision/History: 1 Wears glasses Ability to See in Adequate Light: 0 Adequate Vision Assessment?: No apparent visual deficits Perception  Perception: Within Functional Limits Praxis Praxis: WFL Cognition Cognition Overall Cognitive Status: Within Functional Limits for tasks assessed Arousal/Alertness: Awake/alert Orientation Level: Person;Place;Situation Person: Oriented Place: Oriented Situation: Oriented Attention: Sustained;Selective Sustained Attention: Appears intact Selective Attention: Appears intact Awareness: Appears intact Problem Solving: Appears intact Safety/Judgment: Appears intact Brief Interview for Mental Status (BIMS) Repetition of Three Words (First Attempt): 3 Temporal Orientation: Year: Correct Temporal Orientation: Month: Accurate within  5 days Temporal Orientation: Day: Correct Recall: Sock: Yes, no cue required Recall: Blue: Yes, no cue required Recall: Bed: Yes, no cue required BIMS Summary Score: 99 Sensation Sensation Light Touch: Impaired Detail Light Touch Impaired Details: Impaired RLE Proprioception: Impaired Detail Proprioception  Impaired Details: Impaired RLE Additional Comments: PTA- weaker and neuropthy Coordination Gross Motor Movements are Fluid and Coordinated: No Fine Motor Movements are Fluid and Coordinated: Yes Motor  Motor Motor - Skilled Clinical Observations: generalized weakness  Trunk/Postural Assessment  Cervical Assessment Cervical Assessment: Within Functional Limits Thoracic Assessment Thoracic Assessment: Within Functional Limits Lumbar Assessment Lumbar Assessment:  (posterior pelvic tilt) Postural Control Postural Control: Within Functional Limits  Balance Balance Balance Assessed: Yes Static Sitting Balance Static Sitting - Level of Assistance: 6: Modified independent (Device/Increase time) Dynamic Sitting Balance Dynamic Sitting - Balance Support: During functional activity Dynamic Sitting - Level of Assistance: 6: Modified independent (Device/Increase time) Static Standing Balance Static Standing - Balance Support: During functional activity Static Standing - Level of Assistance: 4: Min assist Dynamic Standing Balance Dynamic Standing - Balance Support: During functional activity Dynamic Standing - Level of Assistance: 4: Min assist Extremity/Trunk Assessment RUE Assessment RUE Assessment: Within Functional Limits LUE Assessment LUE Assessment: Within Functional Limits  Care Tool Care Tool Self Care Eating   Eating Assist Level: Independent    Oral Care    Oral Care Assist Level: Set up assist    Bathing   Body parts bathed by patient: Right arm;Left arm;Abdomen;Chest;Front perineal area;Buttocks;Right upper leg;Left upper leg;Face Body parts bathed by  helper: Right lower leg;Left lower leg   Assist Level: Minimal Assistance - Patient > 75%    Upper Body Dressing(including orthotics)   What is the patient wearing?: Pull over shirt   Assist Level: Supervision/Verbal cueing    Lower Body Dressing (excluding footwear)   What is the patient wearing?: Pants;Underwear/pull up (pad) Assist for lower body dressing: Minimal Assistance - Patient > 75%    Putting on/Taking off footwear   What is the patient wearing?: Non-skid slipper socks Assist for footwear: Total Assistance - Patient < 25%       Care Tool Toileting Toileting activity   Assist for toileting: Minimal Assistance - Patient > 75%     Care Tool Bed Mobility Roll left and right activity        Sit to lying activity        Lying to sitting on side of bed activity   Lying to sitting on side of bed assist level: the ability to move from lying on the back to sitting on the side of the bed with no back support.: Contact Guard/Touching assist     Care Tool Transfers Sit to stand transfer   Sit to stand assist level: Contact Guard/Touching assist    Chair/bed transfer   Chair/bed transfer assist level: Contact Guard/Touching assist     Toilet transfer   Assist Level: Contact Guard/Touching assist     Care Tool Cognition  Expression of Ideas and Wants Expression of Ideas and Wants: 4. Without difficulty (complex and basic) - expresses complex messages without difficulty and with speech that is clear and easy to understand  Understanding Verbal and Non-Verbal Content Understanding Verbal and Non-Verbal Content: 4. Understands (complex and basic) - clear comprehension without cues or repetitions   Memory/Recall Ability     Refer to Care Plan for Long Term Goals  SHORT TERM GOAL WEEK 1 OT Short Term Goal 1 (Week 1): LTG=STG mod I  Recommendations for other services: Neuropsych   Skilled Therapeutic Intervention  OT Evaluation with Ot purpose, role and goals  discussed. Pt received in the bed and reports anxiousness surrounding her circumstance. Pt able to come to EOB with supervision with extra time. Pt  ambulates with RW with contact guard to supervision to the toilet to void with BSC over the top. Pt able to perform hygiene in standing. Pt ambulated back to the sink and stood to wash UB and periarea and buttocks with setup and occasional contact guard. At times pt reported lightheadedness but wanted to continue to stand. 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 Pt able to don shirt and underwear and pants with extra time with contact guard for standing. Pt does have a lot of personal things going on that impact her emotionally and is worried about all her responsibilities- provided encouragement throughout session.    ADL ADL Eating: Independent Grooming: Setup Upper Body Bathing: Minimal assistance Where Assessed-Upper Body Bathing: Sitting at sink Lower Body Bathing: Moderate assistance Where Assessed-Lower Body Bathing: Sitting at sink Upper Body Dressing: Setup Where Assessed-Upper Body Dressing: Sitting at sink Lower Body Dressing: Minimal assistance Where Assessed-Lower Body Dressing: Sitting at sink Toilet Transfer: Contact guard Toilet Transfer Method: Ambulating;Stand pivot Toilet Transfer Equipment: Drop arm bedside commode;Raised toilet seat Mobility  Transfers Sit to Stand: Contact Guard/Touching assist Stand to Sit: Contact Guard/Touching assist   Discharge Criteria: Patient will be discharged from OT if patient refuses treatment 3 consecutive times without medical reason, if treatment goals not met, if there is a change in medical status, if patient makes no progress towards goals or if patient is discharged from hospital.  The above assessment, treatment plan, treatment alternatives and goals were discussed and mutually agreed upon: by patient  Claudene Delon Levy 10/21/2023, 8:32  AM

## 2023-10-21 NOTE — Evaluation (Signed)
 Physical Therapy Assessment and Plan  Patient Details  Name: [name] MRN: [id number] Date of Birth: 01-Sep-1960  PT Diagnosis: Abnormal posture, Abnormality of gait, Difficulty walking, Impaired sensation, and Muscle weakness Rehab Potential: Fair ELOS: 7-10 days   Today's Date: 10/21/2023 PT Individual Time: 8996-8885, 8567-8468 PT Individual Time Calculation (min): 71 min, 59 min  Hospital Problem: Principal Problem:   Debility   Past Medical History:  Past Medical History:  Diagnosis Date   Acute blood loss anemia 06/23/2013   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HCC)    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HCC) 06/24/2013   Anxiety associated with depression 09/23/2012   At high risk for falls 09/23/2012   She has had a fractured ankle and tendon tear with falls over the last couple years.   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   Diverticulitis of large intestine with perforation 09/23/2012   Dyspnea 05/11/2014   GERD (gastroesophageal reflux disease)    Hemoptysis 06/23/2013   History of endometriosis 1997   Hx of tobacco use, presenting hazards to health 09/23/2012   Hyperglycemia 07/24/2013   Hypertension    Hypokalemia 07/20/2013   ILD (interstitial lung disease) (HCC) 01/22/2014   Leukocytosis 05/11/2014   MPA (microscopic polyangiitis) (HCC) 2015   Numbness    Obesity (BMI 30-39.9) 09/23/2012   Paresthesia 12/26/2016   Perforation of sigmoid colon - stercoral 09/27/2012   Physical deconditioning 04/30/2015   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   Respiratory failure with hypoxia (HCC) 06/23/2013   Right leg swelling 12/26/2016   Past Surgical History:  Past Surgical History:  Procedure Laterality Date   APPENDECTOMY     BLADDER REPAIR     BOWEL  RESECTION N/A 10/07/2023   Procedure: EXCISION, SMALL INTESTINE;  Surgeon: Rubin Calamity, MD;  Location: [facility] OR;  Service: General;  Laterality: N/A;   DILATATION & CURETTAGE/HYSTEROSCOPY WITH MYOSURE N/A 06/03/2019   Procedure: DILATATION & CURETTAGE/HYSTEROSCOPY WITH MYOSURE;  Surgeon: Curlene Agent, MD;  Location: [facility] OR;  Service: Gynecology;  Laterality: N/A;   IR CATHETER TUBE CHANGE  06/24/2019   IR FLUORO GUIDE CV LINE RIGHT  06/16/2019   IR RADIOLOGIST EVAL & MGMT  07/29/2019   IR US  GUIDE VASC ACCESS RIGHT  06/16/2019   LAPAROSCOPY  1997   dx of endometriosis   LAPAROSCOPY  04/28/2000    Laparoscopy with lysis of adhesions, hysteroscopy, D&C.   LAPAROTOMY N/A 10/07/2023   Procedure: LAPAROTOMY, EXPLORATORY;  Surgeon: Rubin Calamity, MD;  Location: [facility] OR;  Service: General;  Laterality: N/A;   TONSILLECTOMY      Assessment & Plan Clinical Impression: Patient is a 63 y.o. year old female with with PMHx of  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), Diverticulitis of large intestine with perforation (09/23/2012), Dyspnea (05/11/2014), GERD (gastroesophageal reflux disease), Hemoptysis (06/23/2013), History of endometriosis (1997), tobacco use, presenting hazards to health (09/23/2012), Hyperglycemia (07/24/2013), Hypertension, Hypokalemia (07/20/2013), ILD (interstitial lung disease) (HCC) (01/22/2014), Leukocytosis (05/11/2014), MPA (microscopic polyangiitis) (HCC) (2015), Numbness, Obesity (BMI 30-39.9) (09/23/2012), Paresthesia (  12/26/2016), Perforation of  sigmoid colon - stercoral (09/27/2012), Physical deconditioning (04/30/2015), Pulmonary alveolar hemorrhage (09/03/2013), Respiratory failure with hypoxia (HCC) (06/23/2013), and Right leg swelling (12/26/2016). . They were admitted to [facility] on 10/07/2023 for sepsis, CT abdomen and pelvis showed sigmoid bowel perforation with peritonitis secondary to diverticulitis.  She was started on IV Zosyn , vancomycin, and antifungal given chronic immunosuppression from ANCA vasculitis on CellCept , and taken urgently to the OR for ex lap and colostomy placement by Dr. Rubin.  Follow-up CT scan 10-6 showed multiple pelvic abscesses, IR placed percutaneous drain 10-7.  Cultures negative thus far.  She remains on IV Zosyn  and micafungin, with a stop date of 14 days on 10-12.  She is currently weaning TNA and tolerating a soft diet.  Hospitalization has been otherwise complicated by complex postsurgical wound with wound VAC changed Monday-Thursday, urinary incontinence with pure wick, type 2 diabetes, ANCA vasculitis/ILD with immunosuppressive's health, NICM, hyperkalemia, poorly controlled pain, hypotension on midodrine, and anemia.  PM&R was consulted to evaluate appropriateness for IPR admission.      Per record, at baseline she lives in a private residence, planning to go to her sisters home on discharge with 24/7 help available by her sister.  This is a double wide home with a level entry.  Prior to admission, she was independent of mobility and ADLs, and taking care of her mother with dementia at her sister's home while her husband is away (he is a truck driver gone 25 days a month).  Currently, she is min to max assist +2 for transfers, walking 75 feet with a bariatric rollator at a min assist +2 level, set up assist for upper body ADLs and max assist for lower body ADLs.  She is limited by fatigue, decreased strength when standing from a low height, and poor safety awareness/cueing.   Patient currently  requires min with mobility secondary to muscle weakness and muscle joint tightness, decreased cardiorespiratoy endurance, and decreased standing balance, decreased balance strategies, and difficulty maintaining precautions.  Prior to hospitalization, patient was modified independent  with mobility and lived with Family (sister, husband) in a Mobile home home.  Home access is  Level entry.  Patient will benefit from skilled PT intervention to maximize safe functional mobility, minimize fall risk, and decrease caregiver burden for planned discharge home with intermittent assist.  Anticipate patient will benefit from follow up [facility] at discharge.  PT - End of Session Activity Tolerance: Tolerates 30+ min activity with multiple rests Endurance Deficit: Yes PT Assessment Rehab Potential (ACUTE/IP ONLY): Fair PT Barriers to Discharge: Decreased caregiver support;Wound Care;Weight;Other (comments) PT Barriers to Discharge Comments: JP drain, ostomy, wound vac, IV antibiotics PT Patient demonstrates impairments in the following area(s): Balance;Edema;Motor;Pain;Safety;Skin Integrity;Behavior;Endurance;Nutrition;Sensory PT Transfers Functional Problem(s): Bed Mobility;Car;Bed to Chair PT Locomotion Functional Problem(s): Ambulation PT Plan PT Intensity: Minimum of 1-2 x/day ,45 to 90 minutes PT Frequency: 5 out of 7 days PT Duration Estimated Length of Stay: 7-10 days PT Treatment/Interventions: Ambulation/gait training;Balance/vestibular training;Cognitive remediation/compensation;Community reintegration;Discharge planning;Disease management/prevention;DME/adaptive equipment instruction;Functional mobility training;Neuromuscular re-education;Pain management;Patient/family education;Psychosocial support;Skin care/wound management;Splinting/orthotics;Stair training;Therapeutic Activities;Therapeutic Exercise;UE/LE Strength taining/ROM;UE/LE Coordination activities;Visual/perceptual  remediation/compensation;Wheelchair propulsion/positioning PT Transfers Anticipated Outcome(s): mod I PT Locomotion Anticipated Outcome(s): mod I PT Recommendation Recommendations for Other Services: Neuropsych consult Follow Up Recommendations: Home health PT Patient destination: Home Equipment Recommended: To be determined Equipment Details: pt has cane, bariatric RW   PT Evaluation Precautions/Restrictions Precautions Precautions: Fall Recall of Precautions/Restrictions: Impaired Precaution/Restrictions Comments: Wound vac, abdominal protection precs; LLQ  JP drain, ostomy, IV antibiotics Restrictions Weight Bearing Restrictions Per Provider Order: No Pain Interference Pain Interference Pain Effect on Sleep: 4. Almost constantly Pain Interference with Therapy Activities: 1. Rarely or not at all Pain Interference with Day-to-Day Activities: 3. Frequently Home Living/Prior Functioning Home Living Available Help at Discharge: Family;Available 24 hours/day Type of Home: Mobile home Home Access: Level entry Home Layout: One level Bathroom Shower/Tub: Engineer, manufacturing systems: Standard Bathroom Accessibility: Yes Additional Comments: pt personal home is next door to her sisters house. She will be staying at her hosue when her husband is home (he is a Naval architect). Pt lives in a single wide mobile home with ramp entry--pt endorses her home is cluttered.  Lives With: Family (sister, husband) Prior Function Level of Independence: Independent with transfers;Independent with gait;Independent with basic ADLs;Independent with homemaking with ambulation (intermittent uses cane with mobility 2/2 neuropathy and R LE sciatica pain)  Able to Take Stairs?: Yes Driving: Yes Vocation: On disability Vision/Perception  Vision - History Ability to See in Adequate Light: 0 Adequate Perception Perception: Within Functional Limits Praxis Praxis: WFL  Cognition Overall Cognitive Status:  Within Functional Limits for tasks assessed Arousal/Alertness: Awake/alert Orientation Level: Oriented X4 Year: 2025 Month: October Day of Week: Correct Attention: Sustained;Selective Sustained Attention: Appears intact Selective Attention: Appears intact Memory: Appears intact Awareness: Appears intact Problem Solving: Appears intact Behaviors: Other (comment) (significant anixety) Safety/Judgment: Appears intact Sensation Sensation Light Touch: Impaired Detail Light Touch Impaired Details: Impaired RLE Proprioception: Impaired Detail Proprioception Impaired Details: Impaired RLE Additional Comments: pt endorses baseline B LE peripheral neuropathy and R LE sciatica pain Coordination Gross Motor Movements are Fluid and Coordinated: No Fine Motor Movements are Fluid and Coordinated: Yes Coordination and Movement Description: limited 2/2 weakness, neuropathy, and sciatica pain Motor  Motor Motor: Other (comment) Motor - Skilled Clinical Observations: generalized weakness  Trunk/Postural Assessment  Cervical Assessment Cervical Assessment: Within Functional Limits Thoracic Assessment Thoracic Assessment: Within Functional Limits Lumbar Assessment Lumbar Assessment:  (posterior pelvic tilt) Postural Control Postural Control: Within Functional Limits  Balance Balance Balance Assessed: Yes Static Sitting Balance Static Sitting - Level of Assistance: 6: Modified independent (Device/Increase time) Dynamic Sitting Balance Dynamic Sitting - Balance Support: During functional activity Dynamic Sitting - Level of Assistance: 6: Modified independent (Device/Increase time) Static Standing Balance Static Standing - Balance Support: During functional activity Static Standing - Level of Assistance: 4: Min assist Dynamic Standing Balance Dynamic Standing - Balance Support: During functional activity Dynamic Standing - Level of Assistance: 4: Min assist Extremity Assessment  RUE  Assessment RUE Assessment: Within Functional Limits LUE Assessment LUE Assessment: Within Functional Limits RLE Assessment RLE Assessment: Exceptions to [facility] General Strength Comments: did not formally assess, grossly 3+/5 LLE Assessment LLE Assessment: Exceptions to [facility] General Strength Comments: did not formally assess, grossly 3+/5  Care Tool Care Tool Bed Mobility Roll left and right activity    Min A     Sit to lying activity    Min A     Lying to sitting on side of bed activity   Lying to sitting on side of bed assist level: the ability to move from lying on the back to sitting on the side of the bed with no back support.: Minimal Assistance - Patient > 75%     Care Tool Transfers Sit to stand transfer   Sit to stand assist level: Minimal Assistance - Patient > 75%    Chair/bed transfer   Chair/bed transfer assist level: Minimal Assistance -  Patient > 75%    Car transfer    Safety/medical       Care Tool Locomotion Ambulation    CGA-RW-113 feet       Walk 10 feet activity    CGA-RW-113 feet      Walk 50 feet with 2 turns activity    CGA-RW    Walk 150 feet activity    Safety/Medical     Walk 10 feet on uneven surfaces activity    Safety/Medical    Stairs    Refused       Walk up/down 1 step activity    Refused     Walk up/down 4 steps activity      Refused   Walk up/down 12 steps activity    Refused     Pick up small objects from floor    Safety Medical -Abdominal precautions     Wheelchair    Min A -50 feet         Wheel 50 feet with 2 turns activity    Min A   Wheel 150 feet activity    Max A     Refer to Care Plan for Long Term Goals  SHORT TERM GOAL WEEK 1 PT Short Term Goal 1 (Week 1): STG=LTG 2/2 ELOS  Recommendations for other services: Neuropsych  Skilled Therapeutic Intervention Mobility Transfers Transfers: Sit to Stand;Stand to Sit;Stand Pivot Transfers Sit to Stand: Contact Guard/Touching assist Stand to Sit:  Contact Guard/Touching assist Stand Pivot Transfers: Contact Guard/Touching assist Transfer (Assistive device): Standard walker Locomotion  Gait Ambulation: Yes Gait Assistance: Contact Guard/Touching assist Gait Distance (Feet): 113 Feet Assistive device: Rolling walker Gait Gait: Yes Gait Pattern: Impaired Gait Pattern: Step-through pattern;Decreased stride length;Decreased trunk rotation;Trunk flexed Gait velocity: decreased Stairs / Additional Locomotion Stairs: No Corporate treasurer: Yes Wheelchair Assistance: Doctor, general practice: Both upper extremities Wheelchair Parts Management: Needs assistance Distance: 50   Discharge Criteria: Patient will be discharged from PT if patient refuses treatment 3 consecutive times without medical reason, if treatment goals not met, if there is a change in medical status, if patient makes no progress towards goals or if patient is discharged from hospital.  The above assessment, treatment plan, treatment alternatives and goals were discussed and mutually agreed upon: by patient  Today's Interventions  Evaluation completed (see details above and below) with education on PT POC and goals and individual treatment initiated with focus on safety awareness.   PT managing wound vac, and IV pole throughout session.   Pt overall very anxious and requires frequent redirection to keep attention on task. Pt endorsing increased pain and fatigue after session this morning. Pt requesting pain medicine. Notified nurse. Nurse present to administer. Pt endorses mild dizziness with standing; requiring min A to sit with increased urgency/anxiety from pt.   Assessed vitals:   Seated BP: 150/73 HR 70  standing BP 152/85 HR 79  Pt endorses standing by herself in the room. Education provided regarding pt high fall risk. PT emphasized hospital policies and highly encouraged pt not to get up without staff providing  assistance to reduce fall risk and/or deter pt progress. Pt verablized understanding and agreeable.   Bed mobility: min A Sit to stand: CGA with RW/min A with no AD; pt required min A for stand to sit with urgency 2/2 dizziness Stand pivot transfer: CGA with RW, min A with no AD.  Gait: x113 feet with RW and CGA, verbal cues provided for safety with RW.  Treatment Session 2   Pt supine in bed upon arrival. Pt agreeable to therapy. Pt denies any pain.   Pt highly anxious 2/2 no longer having purewick. Education provided regarding POC, goals-with emphasis on improving pt independence at home.   Pt expressed difficulty walking to bathroom with tech with increased urgency and tech needing to manage IV and wound vac. PT updated safety plan to reflect stand pivot transfer to [facility] next to bed with RW for improved pt safety with urgency. Notified nurse and tech. Pt and pt reviewed this technique. Pt appreciative of this.   Pt expressed discomfort in bed with positioning. Education provided how to utilize bed features to lower head of bed to allow pt to scoot up in bed. Pt utilized bed features and supervision to scoot up in bed. Pt expressed difficulty managing blankets 2/2 abdominal precautions. PT provided pt with reacher for improved indepenence and adherence to precautions with this activity. Recommended pt get a reacher for home.   Pt ambulated 121 feet with RW and CGA, pt overall very winded and SOB with this activity. Education provided regarding energy conservation techniques with emphasis on pacing oneself, pursed lip breathing and taking seated/standing rest breaks as needed. Utilized WC for transport back to room.   Pt requesting to use bathroom. Pt performed stand pivot transfer WC to BSC with RW and CGA. Nurse present at end of session to assist pt.        [facility] [place], [place], DPT  10/21/2023, 10:27 AM

## 2023-10-21 NOTE — Plan of Care (Signed)
  Problem: RH Balance Goal: LTG Patient will maintain dynamic standing balance (PT) Description: LTG:  Patient will maintain dynamic standing balance with assistance during mobility activities (PT) Flowsheets (Taken 10/21/2023 1616) LTG: Pt will maintain dynamic standing balance during mobility activities with:: Independent with assistive device    Problem: Sit to Stand Goal: LTG:  Patient will perform sit to stand with assistance level (PT) Description: LTG:  Patient will perform sit to stand with assistance level (PT) Flowsheets (Taken 10/21/2023 1616) LTG: PT will perform sit to stand in preparation for functional mobility with assistance level: Independent with assistive device   Problem: RH Bed Mobility Goal: LTG Patient will perform bed mobility with assist (PT) Description: LTG: Patient will perform bed mobility with assistance, with/without cues (PT). Flowsheets (Taken 10/21/2023 1616) LTG: Pt will perform bed mobility with assistance level of: Independent with assistive device    Problem: RH Bed to Chair Transfers Goal: LTG Patient will perform bed/chair transfers w/assist (PT) Description: LTG: Patient will perform bed to chair transfers with assistance (PT). Flowsheets (Taken 10/21/2023 1616) LTG: Pt will perform Bed to Chair Transfers with assistance level: Independent with assistive device    Problem: RH Car Transfers Goal: LTG Patient will perform car transfers with assist (PT) Description: LTG: Patient will perform car transfers with assistance (PT). Flowsheets (Taken 10/21/2023 1616) LTG: Pt will perform car transfers with assist:: Supervision/Verbal cueing   Problem: RH Furniture Transfers Goal: LTG Patient will perform furniture transfers w/assist (OT/PT) Description: LTG: Patient will perform furniture transfers  with assistance (OT/PT). Flowsheets (Taken 10/21/2023 1616) LTG: Pt will perform furniture transfers with assist:: Independent with assistive device     Problem: RH Ambulation Goal: LTG Patient will ambulate in controlled environment (PT) Description: LTG: Patient will ambulate in a controlled environment, # of feet with assistance (PT). Flowsheets (Taken 10/21/2023 1616) LTG: Pt will ambulate in controlled environ  assist needed:: Independent with assistive device LTG: Ambulation distance in controlled environment: 150 feet with LRAD Goal: LTG Patient will ambulate in home environment (PT) Description: LTG: Patient will ambulate in home environment, # of feet with assistance (PT). Flowsheets (Taken 10/21/2023 1616) LTG: Pt will ambulate in home environ  assist needed:: Independent with assistive device LTG: Ambulation distance in home environment: 75 feet with LRAD

## 2023-10-21 NOTE — Progress Notes (Addendum)
 PROGRESS NOTE   Subjective/Complaints:  Pt doing ok, very anxious. Slept poorly, states she usually takes 1mg  xanax  at home, says the rx is for 0.5-1mg  TID PRN but she's used to 1mg . Hasn't ever tried anything else for sleep, discussed that she has trazodone available here as well. Discussed appropriate use of meds.  Pain manageable with meds.  Ostomy output about the same as it has been, but she doesn't really know what normal is. Feels overwhelmed by needing to be educated on ostomy.  Urinating well, was anxious about Cr being checked on, reviewed labs with pt at length.  No other specific complaints or concerns.   ROS: as per HPI. Denies CP, SOB, abd pain, N/V/D/C, or any other complaints at this time.   +anxious +insomnia  Objective:   No results found. Recent Labs    10/19/23 0327 10/20/23 0401  WBC 11.1* 11.9*  HGB 8.8* 8.6*  HCT 27.9* 27.9*  PLT 384 417*   Recent Labs    10/19/23 0327 10/20/23 0401  NA 138 138  K 3.9 4.0  CL 104 104  CO2 23 24  GLUCOSE 92 81  BUN 20 19  CREATININE 1.00 0.98  CALCIUM  8.0* 8.1*        Intake/Output Summary (Last 24 hours) at 10/21/2023 0809 Last data filed at 10/21/2023 0715 Gross per 24 hour  Intake 208 ml  Output 2300 ml  Net -2092 ml        Physical Exam: Vital Signs Blood pressure 104/67, pulse 70, temperature 97.7 F (36.5 C), temperature source Oral, resp. rate 18, height 5' 8.5 (1.74 m), weight 123.4 kg, SpO2 94%.  General: No acute distress but VERY anxious about many things.  Mood and affect are anxious Heart: Regular rate and rhythm no rubs murmurs or extra sounds Lungs: Clear to auscultation, breathing unlabored, no rales or wheezes Abdomen: LLQ colostomy with brown stool in bag, abd drain in LLQ with wound vac with good seal.  Positive bowel sounds, soft, some tenderness to palpation around wounds, nondistended Extremities: No clubbing,  cyanosis, or edema Skin: No evidence of breakdown, no evidence of rash over exposed surfaces aside from aforementioned wounds.   PRIOR EXAMS: Neurologic: Cranial nerves II through XII intact, motor strength is 4/5 in bilateral deltoid, bicep, tricep, grip, 4- / 5hip flexor, knee extensors, ankle dorsiflexor and plantar flexor Sensory exam normal sensation to light touch and proprioception in bilateral upper and lower extremities pt states she has neuropathy and her feet feel numb   Musculoskeletal: Full range of motion in all 4 extremities. No joint swelling  Assessment/Plan: 1. Functional deficits which require 3+ hours per day of interdisciplinary therapy in a comprehensive inpatient rehab setting. Physiatrist is providing close team supervision and 24 hour management of active medical problems listed below. Physiatrist and rehab team continue to assess barriers to discharge/monitor patient progress toward functional and medical goals  Care Tool:  Bathing              Bathing assist       Upper Body Dressing/Undressing Upper body dressing        Upper body assist      Lower  Body Dressing/Undressing Lower body dressing            Lower body assist       Toileting Toileting    Toileting assist       Transfers Chair/bed transfer  Transfers assist           Locomotion Ambulation   Ambulation assist              Walk 10 feet activity   Assist           Walk 50 feet activity   Assist           Walk 150 feet activity   Assist           Walk 10 feet on uneven surface  activity   Assist           Wheelchair     Assist               Wheelchair 50 feet with 2 turns activity    Assist            Wheelchair 150 feet activity     Assist          Blood pressure 104/67, pulse 70, temperature 97.7 F (36.5 C), temperature source Oral, resp. rate 18, height 5' 8.5 (1.74 m), weight 123.4  kg, SpO2 94%.  Medical Problem List and Plan: 1. 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 Advised pt on team conference and evals, how d/c planning works.  2.  Antithrombotics: -DVT/anticoagulation:  Pharmaceutical: Lovenox  40mg  daily             -antiplatelet therapy: N/A 3. Pain Management: Still using IV fentanyl  only for wound vac changes --continue tylenol  650mg  qid scheduled+PRN, Robaxin 1000 mg QID and oxycodone  prn, lidoderm  patches. Also on lyrica 50mg  BID 4. Mood/Behavior/Sleep: LCSW to follow for evaluation and support             --Trazodone prn  -takes Xanax  at home (states it's 0.5-1mg  TID PRN but always takes 1mg )-- also uses for sleep.  -10/21/23 updated xanax  indication for sleep if trazodone ineffective, discussed trying trazodone for sleep. Did NOT increase xanax  to avoid oversedation, and discussed that with pt.              -antipsychotic agents: N/A 5. Neuropsych/cognition: This patient is capable of making decisions on her  own behalf. 6. Skin/Wound Care/ostomy LLQ: Wound VAC change twice a week by WOC             --continue to encourage intake.  -10/21/23 very anxious about ostomy education-- might benefit to have WOC come an extra day per week to see her specifically about ostomy education.  7. Fluids/Electrolytes/Nutrition: monitor I&O and labs, continue vitamins and supplements              --Continue Ensure BID. Add Juven BID -10/21/23 Mg being checked daily, but has been WNL, 2.0 today. Will d/c daily checks, can check as needed.  8.  Abdominal abscess: Drain in place. Cultures  negative. --per  [facility] to continue Zosyn /Micafungin for 14 days with EOT 10/21/23 9.  Malnutrition: Po intake poor and TPN weaned off 10/10             --continue Ensure BID, juven 10. Hypotension: Continue midodrine 10 mg TID  -10/21/23 BPs great, monitor Vitals:   10/20/23 1309 10/20/23 1954 10/21/23 0356  BP: 132/74 (!) 114/53 104/67    11.  ANCA associated vasculitis/ILD:  Methotrexate  and Cellcept  on hold. Continue Incruse inhaler 12.  T2DM: Hgb A1C- 4.6. Monitor BS. Was on Ozempic outpatient -10/21/23 pt states she's been on ozempic and her diabetes was under great control, obviously off of that now-- will add on CBG checks daily in the AM to monitor, if noting rising fasting sugars, might need to check more often. Caution with supplements being carb heavy 13. Obesity: Educate on diet and exercise to help with mobility and overall health 14. H/o Depression: On prozac 40mg  daily and Wellbutrin  450mg  daily.  15. Bowel management: hx of profound chronic constipation, now with colostomy. Continue Colace 100mg  BID, miralax  daily. Monitor ostomy output.  16. ABLA/thrombocytosis: Hgb 8's, monitor routinely. Plt 417 likely reactive.   I spent >51mins performing patient care related activities, including prolonged face to face time, documentation time, prolonged review of prior meds and labs, med management, discussion of labs/meds/care with patient and nursing staff, and overall coordination of care.    LOS: 1 days A FACE TO FACE EVALUATION WAS PERFORMED  [address] 10/21/2023, 8:09 AM

## 2023-10-21 NOTE — Progress Notes (Signed)
 PMR Admission Coordinator Pre-Admission Assessment   Patient: [name] is an 63 y.o., female MRN: [id number] DOB: [birth year] Height: 5' 9 (175.3 cm) Weight: 122.3 kg   Insurance Information HMO:     PPO:      PCP:      IPA:      80/20:      OTHER:  PRIMARY: Aetna NAP      Policy#: [id number]       Subscriber: pt CM Name: [employer]      Phone#: [phone]     Fax#: [phone] Pre-Cert#: [id number] Received insurance authorization from [employer] with [employer] on 10/19/23. Pt approved from 10/19/23-10/25/23. Updates due on 10/26/23.     Employer:  Benefits:  Phone #: online [email or web address]     Name:  Eff. Date: 01/09/22-still active     Deduct: $2,500 ($2,500 met)     Out of Pocket Max: $6,550 ($4,271.63 met)      Life Max: NA CIR: 70% coverage, 30% co-insurance      SNF: 70% coverage, 30% co-insurance Outpatient: 70% coverage     Co-Pay: 30% co-insurance Home Health: 70% coverage      Co-Pay: 30% co-insurance DME: 70% coverage     Co-Pay: 30% co-insurance Providers: in-network SECONDARY:       Policy#:      Phone#:    Artist:       Phone#:    The Data processing manager" for patients in Inpatient Rehabilitation Facilities with attached "Privacy Act Statement-Health Care Records" was provided and verbally reviewed with: Patient and Family   Emergency Contact Information Contact Information       Name Relation Home Work Mobile    Jeudy,John Spouse [phone]        Debby Holley Ahumada [phone]   [phone]         Other Contacts   None on File        Current Medical History  Patient Admitting Diagnosis: sepsis, feculent peritonitis, perforated sigmoid colon s/p Hartman's   History of Present Illness: Pt is a 63 y/o female with PMH of ANCA associated vasculitis, diffuse pulmonary alveolar hemorrhage, immunosuppression, DM, HTN< ILD< and previous sigmoid colon perforation, anxiety who was admitted to [facility] on 9/28  with 7 day history of left sided abdominal pain.  In ED she was tachycardic, hypotensive, febrile to 100.7, labs notable for K 3.2, glucose 148, creatinine 1.44, albumin  2.9, WBC 12.3.  CT ab/pelvis obtained which revealed marked sigmoid colitis with perofration evidence by extraluminal gas and stool which extended into the small bowel mesentery with extensive inflammation and early fluid collection.  On 9/28 she underwent ex lap for sigmoid bowel perforation with feculent peritonitis.  Pos operatively she required pressor support.  SHe is on TPN and IV abx/antifungals.  Wound vac to RLQ and ostomy.  Therapy ongoing and pt has been recommended for CIR.     Patient's medical record from [facility] has been reviewed by the rehabilitation admission coordinator and physician.   Past Medical History      Past Medical History:  Diagnosis Date   Acute blood loss anemia 06/23/2013   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HCC)     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HCC) 06/24/2013   Anxiety associated with depression 09/23/2012   At high risk for falls 09/23/2012    She has had a fractured ankle and tendon tear with falls over the last couple years.   Cardiomyopathy (HCC)     Chronic fatigue 05/11/2014  Chronic right-sided low back pain with right-sided sciatica 12/26/2016   Colitis     Constipation, chronic 09/27/2012   Diabetes mellitus without complication (HCC)     Diffuse pulmonary alveolar hemorrhage 06/23/2013   Diverticulitis of large intestine with perforation 09/23/2012   Dyspnea 05/11/2014   GERD (gastroesophageal reflux disease)     Hemoptysis 06/23/2013   History of endometriosis 1997   Hx of tobacco use, presenting hazards to health 09/23/2012   Hyperglycemia 07/24/2013   Hypertension     Hypokalemia 07/20/2013   ILD (interstitial lung disease) (HCC) 01/22/2014   Leukocytosis 05/11/2014   MPA (microscopic polyangiitis) (HCC) 2015   Numbness     Obesity (BMI 30-39.9) 09/23/2012   Paresthesia 12/26/2016    Perforation of sigmoid colon - stercoral 09/27/2012   Physical deconditioning 04/30/2015   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   Respiratory failure with hypoxia (HCC) 06/23/2013   Right leg swelling 12/26/2016          Has the patient had major surgery during 100 days prior to admission? Yes   Family History   family history includes Birth defects in her cousin; Breast cancer in her paternal grandmother; CAD in an other family member; Diabetes in her father and mother; Hypertension in her father; Obesity in her father.   Current Medications  Current Medications    Current Facility-Administered Medications:    acetaminophen  (TYLENOL ) tablet 1,000 mg, 1,000 mg, Oral, QID, Simaan, Elizabeth S, PA-C, 1,000 mg at 10/17/23 2146   ALPRAZolam  (XANAX ) tablet 0.5 mg, 0.5 mg, Oral, BID PRN, Neda, Adewale A, MD, 0.5 mg at 10/17/23 2147   ascorbic acid (VITAMIN C) tablet 250 mg, 250 mg, Oral, BID, Sigdel, Santosh, MD, 250 mg at 10/18/23 9075   buPROPion  (WELLBUTRIN  XL) 24 hr tablet 450 mg, 450 mg, Oral, Daily, Pham, Minh Q, RPH-CPP, 450 mg at 10/18/23 9076   Chlorhexidine  Gluconate Cloth 2 % PADS 6 each, 6 each, Topical, Daily, Ramirez, Armando, MD, 6 each at 10/18/23 0917   docusate (COLACE) 50 MG/5ML liquid 100 mg, 100 mg, Oral, BID PRN, Neda, Adewale A, MD, 100 mg at 10/10/23 2224   docusate sodium  (COLACE) capsule 100 mg, 100 mg, Oral, BID, Simaan, Elizabeth S, PA-C, 100 mg at 10/18/23 9076   feeding supplement (ENSURE PLUS HIGH PROTEIN) liquid 237 mL, 237 mL, Oral, BID BM, Sigdel, Santosh, MD, 237 mL at 10/18/23 0920   fentaNYL  (SUBLIMAZE ) injection 50 mcg, 50 mcg, Intravenous, Q4H PRN, Simaan, Elizabeth S, PA-C   FLUoxetine (PROZAC) capsule 40 mg, 40 mg, Oral, Daily, Pham, Minh Q, RPH-CPP, 40 mg at 10/18/23 0923   glucagon (human recombinant) (GLUCAGEN) injection 1 mg, 1 mg, Intravenous, PRN, Amin, Ankit C, MD   guaiFENesin (ROBITUSSIN) 100 MG/5ML liquid 5 mL, 5 mL, Oral, Q4H PRN,  Amin, Ankit C, MD   heparin  injection 5,000 Units, 5,000 Units, Subcutaneous, Q8H, Simpson, Paula B, NP, 5,000 Units at 10/18/23 0608   hydrALAZINE  (APRESOLINE ) injection 10 mg, 10 mg, Intravenous, Q4H PRN, Amin, Ankit C, MD   insulin  aspart (novoLOG ) injection 0-9 Units, 0-9 Units, Subcutaneous, Q8H, Wilson, Heather W, RPH   ipratropium-albuterol  (DUONEB) 0.5-2.5 (3) MG/3ML nebulizer solution 3 mL, 3 mL, Nebulization, Q4H PRN, Amin, Ankit C, MD   lactose free nutrition (BOOST PLUS) liquid 237 mL, 237 mL, Oral, BID BM, Simaan, Elizabeth S, PA-C   lidocaine  (LIDODERM ) 5 % 2 patch, 2 patch, Transdermal, Q24H, Simaan, Elizabeth S, PA-C, 2 patch at 10/18/23 0921   methocarbamol (ROBAXIN) tablet 1,000 mg,  1,000 mg, Oral, QID, Simaan, Elizabeth S, PA-C, 1,000 mg at 10/18/23 9076   metoprolol  tartrate (LOPRESSOR ) injection 5 mg, 5 mg, Intravenous, Q4H PRN, Amin, Ankit C, MD   micafungin (MYCAMINE) 150 mg in sodium chloride  0.9 % 100 mL IVPB, 150 mg, Intravenous, Q24H, Rubin Calamity, MD, Last Rate: 107.5 mL/hr at 10/17/23 2146, 150 mg at 10/17/23 2146   midodrine (PROAMATINE) tablet 10 mg, 10 mg, Oral, TID WC, Olalere, Adewale A, MD, 10 mg at 10/18/23 9076   multivitamin with minerals tablet 1 tablet, 1 tablet, Oral, Daily, Chen, Lydia D, [facility], 1 tablet at 10/18/23 0924   mupirocin ointment (BACTROBAN) 2 %, , Nasal, BID, Olalere, Adewale A, MD, Given at 10/18/23 9074   nutrition supplement (JUVEN) (JUVEN) powder packet 1 packet, 1 packet, Oral, BID BM, Sigdel, Santosh, MD, 1 packet at 10/17/23 1715   ondansetron  (ZOFRAN ) injection 4 mg, 4 mg, Intravenous, Q6H, Sigdel, Santosh, MD, 4 mg at 10/18/23 0608   Oral care mouth rinse, 15 mL, Mouth Rinse, PRN, Olalere, Adewale A, MD   oxyCODONE  (Oxy IR/ROXICODONE ) immediate release tablet 5 mg, 5 mg, Oral, Once, Paliwal, Aditya, MD   oxyCODONE  (Oxy IR/ROXICODONE ) immediate release tablet 5-10 mg, 5-10 mg, Oral, Q6H PRN, Simaan, Elizabeth S, PA-C, 10 mg at 10/18/23  0322   phenol (CHLORASEPTIC) mouth spray 1 spray, 1 spray, Mouth/Throat, PRN, Simaan, Elizabeth S, PA-C   [COMPLETED] piperacillin -tazobactam (ZOSYN ) IVPB 3.375 g, 3.375 g, Intravenous, Once, Stopped at 10/07/23 1502 **FOLLOWED BY** piperacillin -tazobactam (ZOSYN ) IVPB 3.375 g, 3.375 g, Intravenous, Q8H, Rubin Calamity, MD, Last Rate: 12.5 mL/hr at 10/18/23 0609, 3.375 g at 10/18/23 9390   polyethylene glycol (MIRALAX  / GLYCOLAX ) packet 17 g, 17 g, Oral, Daily, Antonetta Moccasin B, NP, 17 g at 10/15/23 0837   pregabalin (LYRICA) capsule 50 mg, 50 mg, Oral, BID, Simpson, Paula B, NP, 50 mg at 10/18/23 9076   prochlorperazine (COMPAZINE) injection 10 mg, 10 mg, Intravenous, Q6H PRN, Segars, Jonathan, MD, 10 mg at 10/17/23 2134   sodium chloride  flush (NS) 0.9 % injection 10-40 mL, 10-40 mL, Intracatheter, Q12H, Olalere, Adewale A, MD, 20 mL at 10/18/23 0925   sodium chloride  flush (NS) 0.9 % injection 10-40 mL, 10-40 mL, Intracatheter, PRN, Olalere, Adewale A, MD, 10 mL at 10/14/23 2128   TPN ADULT (ION), , Intravenous, Continuous TPN, Tanda Powell ORN, [facility], Last Rate: 75 mL/hr at 10/17/23 1744, Infusion Verify at 10/17/23 1744   TPN ADULT (ION), , Intravenous, Continuous TPN, Tanda Powell ORN, RPH   umeclidinium bromide (INCRUSE ELLIPTA) 62.5 MCG/ACT 1 puff, 1 puff, Inhalation, Daily, Antonetta Moccasin B, NP, 1 puff at 10/18/23 0825   vitamin A capsule 10,000 Units, 10,000 Units, Oral, Daily, Sigdel, Santosh, MD, 10,000 Units at 10/18/23 9075     Patients Current Diet:  Diet Order                  DIET SOFT Room service appropriate? Yes; Fluid consistency: Thin  Diet effective now                         Precautions / Restrictions Precautions Precautions: Fall Precaution/Restrictions Comments: Wound vac, TPN, abdominal protection precs; LLQ JP drain Restrictions Weight Bearing Restrictions Per Provider Order: No    Has the patient had 2 or more falls or a fall with injury in the past  year? No   Prior Activity Level Community (5-7x/wk): independent, no DME, helping her sister care for their mom who  has dementia   Prior Functional Level Self Care: Did the patient need help bathing, dressing, using the toilet or eating? Independent   Indoor Mobility: Did the patient need assistance with walking from room to room (with or without device)? Independent   Stairs: Did the patient need assistance with internal or external stairs (with or without device)? Independent   Functional Cognition: Did the patient need help planning regular tasks such as shopping or remembering to take medications? Independent   Patient Information Are you of Hispanic, Latino/a,or Spanish origin?: A. No, not of Hispanic, Latino/a, or Spanish origin What is your race?: A. White Do you need or want an interpreter to communicate with a doctor or health care staff?: 0. No   Patient's Response To:  Health Literacy and Transportation Is the patient able to respond to health literacy and transportation needs?: Yes Health Literacy - How often do you need to have someone help you when you read instructions, pamphlets, or other written material from your doctor or pharmacy?: Never In the past 12 months, has lack of transportation kept you from medical appointments or from getting medications?: No In the past 12 months, has lack of transportation kept you from meetings, work, or from getting things needed for daily living?: No   Home Assistive Devices / Equipment Home Equipment: Tub bench, Grab bars - tub/shower, Hand held shower head   Prior Device Use: Indicate devices/aids used by the patient prior to current illness, exacerbation or injury? None of the above   Current Functional Level Cognition   Orientation Level: Oriented X4    Extremity Assessment (includes Sensation/Coordination)   Upper Extremity Assessment: Generalized weakness  Lower Extremity Assessment: Defer to PT evaluation     ADLs    Overall ADL's : Needs assistance/impaired Grooming: Set up, Sitting Upper Body Dressing : Set up, Sitting Lower Body Dressing: Maximal assistance, Sit to/from stand Toilet Transfer: Minimal assistance, +2 for safety/equipment, Rolling walker (2 wheels), Maximal assistance, +2 for physical assistance, Regular Toilet, Ambulation, Grab bars Toilet Transfer Details (indicate cue type and reason): Min assist from bed height, max +2 from low toilet heavy reliance on GB Toileting- Clothing Manipulation and Hygiene: Supervision/safety, Sitting/lateral lean Functional mobility during ADLs: Minimal assistance, +2 for safety/equipment General ADL Comments: Decreased strength when standing from low height, poor activity tolerance     Mobility   Overal bed mobility: Needs Assistance Bed Mobility: Supine to Sit Supine to sit: Mod assist, HOB elevated, Used rails Sit to supine: +2 for safety/equipment, HOB elevated, Used rails, Contact guard assist General bed mobility comments: pt educated on abdominal protection with log roll technique but pt declines voicing Let me just show you how I do it, need up to mod assist for trunk support and therapist encouraging partial roll to protect abd.     Transfers   Overall transfer level: Needs assistance Equipment used: Rolling walker (2 wheels) (bariatric) Transfers: Sit to/from Stand Sit to Stand: From elevated surface, Min assist, +2 safety/equipment, Mod assist Bed to/from chair/wheelchair/BSC transfer type:: Step pivot Step pivot transfers: Max assist, +2 physical assistance, +2 safety/equipment General transfer comment: from EOB with min assist, from low toilet seat with wall rail and +2 modA to RW after x 5 unsuccessful attempts with +1 min to modA. RW>chair with cues for safe UE placement.     Ambulation / Gait / Stairs / Wheelchair Mobility   Ambulation/Gait Ambulation/Gait assistance: Contact guard assist, Min assist, +2 safety/equipment Gait Distance  (Feet): 75 Feet Assistive  device: Rolling walker (2 wheels) (bariatric) Gait Pattern/deviations: Step-through pattern, Decreased step length - right, Decreased step length - left, Trunk flexed, Wide base of support General Gait Details: CGA to Min physical assist for RW management and steadying, esp when using rollator; ~95ft to bathroom with bari RW, then ~15ft with bari rollator. Second person for line assist and preparing to offer chair follow PRN for safety; vc for sequencing, posture and activity pacing. Pt reports bari RW feels more stable to her than rollator, case mgmt notified after session. Gait velocity: slowed Gait velocity interpretation: <1.31 ft/sec, indicative of household ambulator     Posture / Balance Balance Overall balance assessment: Needs assistance Sitting-balance support: No upper extremity supported, Feet supported Sitting balance-Leahy Scale: Fair Standing balance support: Bilateral upper extremity supported, During functional activity, Reliant on assistive device for balance Standing balance-Leahy Scale: Poor Standing balance comment: reliant on RW support for dynamic standing tasks     Special considerations/life events  Wound Vac yes, RLQ, Skin RLQ surgical wound, ostomy, and Diabetic management yes    Previous Home Environment (from acute therapy documentation) Living Arrangements: Other (Comment) (going to stay at her sisters house) Available Help at Discharge: Family, Available 24 hours/day (sister) Type of Home: Mobile home Home Layout: One level Home Access: Level entry Bathroom Shower/Tub: Engineer, manufacturing systems: Standard Bathroom Accessibility: Yes Home Care Services: No   Discharge Living Setting Plans for Discharge Living Setting: Lives with (comment) (sister) Type of Home at Discharge: Mobile home Discharge Home Layout: One level Discharge Home Access: Level entry Discharge Bathroom Shower/Tub: Tub/shower unit Discharge Bathroom  Toilet: Standard Discharge Bathroom Accessibility: Yes How Accessible: Accessible via walker Does the patient have any problems obtaining your medications?: No   Social/Family/Support Systems Patient Roles: Spouse Contact Information: spouse is long distance truck driver, [name]  [phone] Anticipated Caregiver: sister, [name] Anticipated Caregiver's Contact Information: [phone] Ability/Limitations of Caregiver: supervision Caregiver Availability: 24/7 Discharge Plan Discussed with Primary Caregiver: Yes Is Caregiver In Agreement with Plan?: Yes Does Caregiver/Family have Issues with Lodging/Transportation while Pt is in Rehab?: No   Goals Patient/Family Goal for Rehab: PT/OT mod I, SLP n/a Expected length of stay: 6-9 days Additional Information: Discharge plan: mod I goals, return home to sister's home Pt/Family Agrees to Admission and willing to participate: Yes Program Orientation Provided & Reviewed with Pt/Caregiver Including Roles  & Responsibilities: Yes   Decrease burden of Care through IP rehab admission: n/a   Possible need for SNF placement upon discharge:  Not anticipated.  Plan for discharge home to sister's home, mod I goals.    Patient Condition: This patient's condition remains as documented in the consult dated 10/18/23, in which the Rehabilitation Physician determined and documented that the patient's condition is appropriate for intensive rehabilitative care in an inpatient rehabilitation facility. Wil.   Preadmission Screen Completed By:  Caitlin E Warren, PT, DPT with updates by Tinnie Yvone Cohens 10/18/2023 11:09 AM ______________________________________________________________________   Discussed status with Dr. Carilyn  on 10/20/23 at 900 and received approval for admission today.   Admission Coordinator:  Caitlin E Warren, PT, DPT time 900/Date 10/20/23    Assessment/Plan: Diagnosis: debility after sepsis Does the need for close, 24 hr/day Medical  supervision in concert with the patient's rehab needs make it unreasonable for this patient to be served in a less intensive setting? Yes Co-Morbidities requiring supervision/potential complications: s/p new colostomy, complex wound care with wound vac and abd drain.  Intra abdominal infaection on IV antibiotics Due  to bladder management, bowel management, safety, skin/wound care, disease management, medication administration, pain management, and patient education, does the patient require 24 hr/day rehab nursing? Yes Does the patient require coordinated care of a physician, rehab nurse, PT, OT, to address physical and functional deficits in the context of the above medical diagnosis(es)? Yes Addressing deficits in the following areas: balance, endurance, locomotion, strength, transferring, bowel/bladder control, bathing, dressing, feeding, grooming, toileting, and psychosocial support Can the patient actively participate in an intensive therapy program of at least 3 hrs of therapy 5 days a week? Yes The potential for patient to make measurable gains while on inpatient rehab is good Anticipated functional outcomes upon discharge from inpatient rehab: modified independent and supervision PT, modified independent and supervision OT, n/a SLP Estimated rehab length of stay to reach the above functional goals is: 6-9d Anticipated discharge destination: Home 10. Overall Rehab/Functional Prognosis: good     MD Signature: Prentice CHARLENA Compton M.D. [facility] Fellow Am Acad of Phys Med and Rehab Diplomate Am Board of Electrodiagnostic Med Fellow Am Board of Interventional Pain

## 2023-10-21 NOTE — Plan of Care (Signed)
  Problem: RH Balance Goal: LTG Patient will maintain dynamic standing with ADLs (OT) Description: LTG:  Patient will maintain dynamic standing balance with assist during activities of daily living (OT)  Flowsheets (Taken 10/21/2023 0833) LTG: Pt will maintain dynamic standing balance during ADLs with: Independent with assistive device   Problem: Sit to Stand Goal: LTG:  Patient will perform sit to stand in prep for activites of daily living with assistance level (OT) Description: LTG:  Patient will perform sit to stand in prep for activites of daily living with assistance level (OT) Flowsheets (Taken 10/21/2023 0833) LTG: PT will perform sit to stand in prep for activites of daily living with assistance level: Independent with assistive device   Problem: RH Bathing Goal: LTG Patient will bathe all body parts with assist levels (OT) Description: LTG: Patient will bathe all body parts with assist levels (OT) Flowsheets (Taken 10/21/2023 0833) LTG: Pt will perform bathing with assistance level/cueing: Independent with assistive device    Problem: RH Dressing Goal: LTG Patient will perform upper body dressing (OT) Description: LTG Patient will perform upper body dressing with assist, with/without cues (OT). Flowsheets (Taken 10/21/2023 [phone]) LTG: Pt will perform upper body dressing with assistance level of: Independent with assistive device Goal: LTG Patient will perform lower body dressing w/assist (OT) Description: LTG: Patient will perform lower body dressing with assist, with/without cues in positioning using equipment (OT) Flowsheets (Taken 10/21/2023 0833) LTG: Pt will perform lower body dressing with assistance level of: Independent with assistive device   Problem: RH Toileting Goal: LTG Patient will perform toileting task (3/3 steps) with assistance level (OT) Description: LTG: Patient will perform toileting task (3/3 steps) with assistance level (OT)  Flowsheets (Taken 10/21/2023  0833) LTG: Pt will perform toileting task (3/3 steps) with assistance level: Independent with assistive device   Problem: RH Toilet Transfers Goal: LTG Patient will perform toilet transfers w/assist (OT) Description: LTG: Patient will perform toilet transfers with assist, with/without cues using equipment (OT) Flowsheets (Taken 10/21/2023 0833) LTG: Pt will perform toilet transfers with assistance level of: Independent with assistive device

## 2023-10-22 ENCOUNTER — Inpatient Hospital Stay (HOSPITAL_COMMUNITY)

## 2023-10-22 DIAGNOSIS — D62 Acute posthemorrhagic anemia: Secondary | ICD-10-CM

## 2023-10-22 DIAGNOSIS — I7782 Antineutrophilic cytoplasmic antibody (ANCA) vasculitis: Secondary | ICD-10-CM

## 2023-10-22 DIAGNOSIS — R059 Cough, unspecified: Secondary | ICD-10-CM

## 2023-10-22 LAB — BASIC METABOLIC PANEL WITH GFR
Anion gap: 9 (ref 5–15)
BUN: 17 mg/dL (ref 8–23)
CO2: 25 mmol/L (ref 22–32)
Calcium: 8.6 mg/dL — ABNORMAL LOW (ref 8.9–10.3)
Chloride: 104 mmol/L (ref 98–111)
Creatinine, Ser: 1.12 mg/dL — ABNORMAL HIGH (ref 0.44–1.00)
GFR, Estimated: 56 mL/min — ABNORMAL LOW (ref >60)
Glucose, Bld: 81 mg/dL (ref 70–99)
Potassium: 3.9 mmol/L (ref 3.5–5.1)
Sodium: 138 mmol/L (ref 135–145)

## 2023-10-22 LAB — CBC
HCT: 28.1 % — ABNORMAL LOW (ref 36.0–46.0)
Hemoglobin: 8.8 g/dL — ABNORMAL LOW (ref 12.0–15.0)
MCH: 28.6 pg (ref 26.0–34.0)
MCHC: 31.3 g/dL (ref 30.0–36.0)
MCV: 91.2 fL (ref 80.0–100.0)
Platelets: 484 K/uL — ABNORMAL HIGH (ref 150–400)
RBC: 3.08 MIL/uL — ABNORMAL LOW (ref 3.87–5.11)
RDW: 14.6 % (ref 11.5–15.5)
WBC: 9.1 K/uL (ref 4.0–10.5)
nRBC: 0 % (ref 0.0–0.2)

## 2023-10-22 LAB — GLUCOSE, CAPILLARY: Glucose-Capillary: 81 mg/dL (ref 70–99)

## 2023-10-22 MED ORDER — DEXTROMETHORPHAN POLISTIREX ER 30 MG/5ML PO SUER
15.0000 mg | Freq: Two times a day (BID) | ORAL | Status: DC | PRN
Start: 2023-10-22 — End: 2023-10-28
  Filled 2023-10-22: qty 5

## 2023-10-22 MED ORDER — MIDODRINE HCL 5 MG PO TABS
10.0000 mg | ORAL_TABLET | Freq: Three times a day (TID) | ORAL | Status: DC
  Administered 2023-10-22 – 2023-10-28 (×17): 10 mg via ORAL
  Filled 2023-10-22 (×17): qty 2

## 2023-10-22 MED ORDER — PANTOPRAZOLE SODIUM 40 MG PO TBEC
40.0000 mg | DELAYED_RELEASE_TABLET | Freq: Every day | ORAL | Status: DC
  Administered 2023-10-22: 40 mg via ORAL
  Filled 2023-10-22: qty 1

## 2023-10-22 MED ORDER — CHLORHEXIDINE GLUCONATE CLOTH 2 % EX PADS
6.0000 | MEDICATED_PAD | Freq: Two times a day (BID) | CUTANEOUS | Status: DC
  Administered 2023-10-22 – 2023-10-28 (×6): 6 via TOPICAL

## 2023-10-22 NOTE — Progress Notes (Signed)
 Occupational Therapy Session Note  Patient Details  Name: [name] MRN: [id number] Date of Birth: 09-Jan-1961  Today's Date: 10/22/2023 OT Individual Time: 8954-8845 OT Individual Time Calculation (min): 69 min    Short Term Goals: Week 1:  OT Short Term Goal 1 (Week 1): LTG=STG mod I  Skilled Therapeutic Interventions/Progress Updates:    Pt sitting up in wheelchair at time of session, extended time initially for rapport building. 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 Answered questions as able regarding wound vac and IV, but recommended speaking with MD for future questions. OT assisting managing wound vac and IV pole thorughout session, educated on maneuvering as well. Pt ambulated wheelchair <> bathroom with BSC over toilet with MIN A to assist with IV pole and tubing, CGA for transfer and sit <> stands. Sink level for hand hygiene and grooming tasks. Self propel room <> day room with extended time and OT managing lines, and pt fatigued from this. In gym focused on endurance and activity tolerance with 2kg ball for overhead, chest press, diagonals for 1x10 each direction. Pt noted to fatigue easily and recovery breaks PRN. Back in room short distance ambulation to bed, nursing present for ostomy emptying. Alarm on call bell in reach.   Therapy Documentation Precautions:  Precautions Precautions: Fall Recall of Precautions/Restrictions: Impaired Precaution/Restrictions Comments: Wound vac, abdominal protection precs; LLQ JP drain, ostomy, IV antibiotics Restrictions Weight Bearing Restrictions Per Provider Order: No    Therapy/Group: Individual Therapy  [name] 10/22/2023, 12:13 PM

## 2023-10-22 NOTE — Progress Notes (Signed)
 Inpatient Rehabilitation  Patient information reviewed and entered into eRehab system by [facility]. Karen Kays., CCC/SLP, PPS Coordinator.  Information including medical coding, functional ability and quality indicators will be reviewed and updated through discharge.

## 2023-10-22 NOTE — Progress Notes (Signed)
 Patient ID: [name], female   DOB: 1960-11-23, 63 y.o.   MRN: [id number] Met with the patient to review current medical situation, rehab process, team conference and plan of care. Patient reported abd pain, nausea and cough; cough medication ordered.  Reports poor wound vac fit, leaking and ostomy leaking over weekend. Unable to get a good seal on vac; WOC recommend moist-dry dressing at d/c instead of vac.  Patient has not had much practice with ostomy care; encouraged patient to practice emptying pouch throughout the day.  Additional supplies for d/c ordered. Hopeful to have JP drain removed PTD as well. Continue to follow along to address educational needs to facilitate preparation for discharge. [name]

## 2023-10-22 NOTE — Progress Notes (Signed)
 Inpatient Rehabilitation Care Coordinator Assessment and Plan Patient Details  Name: [name] MRN: [id number] Date of Birth: 08/28/1960  Today's Date: 10/22/2023  Hospital Problems: Principal Problem:   Debility  Past Medical History:  Past Medical History:  Diagnosis Date   Acute blood loss anemia 06/23/2013   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HCC)    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HCC) 06/24/2013   Anxiety associated with depression 09/23/2012   At high risk for falls 09/23/2012   She has had a fractured ankle and tendon tear with falls over the last couple years.   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   Diverticulitis of large intestine with perforation 09/23/2012   Dyspnea 05/11/2014   GERD (gastroesophageal reflux disease)    Hemoptysis 06/23/2013   History of endometriosis 1997   Hx of tobacco use, presenting hazards to health 09/23/2012   Hyperglycemia 07/24/2013   Hypertension    Hypokalemia 07/20/2013   ILD (interstitial lung disease) (HCC) 01/22/2014   Leukocytosis 05/11/2014   MPA (microscopic polyangiitis) (HCC) 2015   Numbness    Obesity (BMI 30-39.9) 09/23/2012   Paresthesia 12/26/2016   Perforation of sigmoid colon - stercoral 09/27/2012   Physical deconditioning 04/30/2015   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   Respiratory failure with hypoxia (HCC) 06/23/2013   Right leg swelling 12/26/2016   Past Surgical History:  Past Surgical History:  Procedure Laterality Date   APPENDECTOMY     BLADDER REPAIR     BOWEL RESECTION N/A 10/07/2023   Procedure: EXCISION, SMALL INTESTINE;  Surgeon: Rubin Calamity, MD;  Location: [facility] OR;  Service: General;  Laterality: N/A;   DILATATION & CURETTAGE/HYSTEROSCOPY WITH MYOSURE N/A 06/03/2019   Procedure:  DILATATION & CURETTAGE/HYSTEROSCOPY WITH MYOSURE;  Surgeon: Curlene Agent, MD;  Location: [facility] OR;  Service: Gynecology;  Laterality: N/A;   IR CATHETER TUBE CHANGE  06/24/2019   IR FLUORO GUIDE CV LINE RIGHT  06/16/2019   IR RADIOLOGIST EVAL & MGMT  07/29/2019   IR US  GUIDE VASC ACCESS RIGHT  06/16/2019   LAPAROSCOPY  1997   dx of endometriosis   LAPAROSCOPY  04/28/2000    Laparoscopy with lysis of adhesions, hysteroscopy, D&C.   LAPAROTOMY N/A 10/07/2023   Procedure: LAPAROTOMY, EXPLORATORY;  Surgeon: Rubin Calamity, MD;  Location: [facility] OR;  Service: General;  Laterality: N/A;   TONSILLECTOMY     Social History:  reports that she quit smoking about 22 years ago. Her smoking use included cigarettes. She started smoking about 49 years ago. She has a 27 pack-year smoking history. She has never used smokeless tobacco. She reports that she does not drink alcohol and does not use drugs.  Family / Support Systems Marital Status: Married Patient Roles: Spouse Spouse/Significant Other: [name] Other Supports: Sister, [name] Anticipated Caregiver: Unknown at present Ability/Limitations of Caregiver: Unknown Caregiver Availability: Other (Comment) (Unknown at present)  Social History Preferred language: English Religion: [employer] - How often do you need to have someone help you when you read instructions, pamphlets, or other written material from your doctor or pharmacy?: Patient unable to respond Writes: Yes Employment Status:  (Unable to assess)   Abuse/Neglect Abuse/Neglect Assessment Can Be Completed: Unable to assess, patient is non-responsive or altered mental status Physical Abuse: Denies Verbal Abuse: Denies Sexual Abuse: Denies Exploitation of patient/patient's  resources: Denies Self-Neglect: Denies  Patient response to: Social Isolation - How often do you feel lonely or isolated from those around you?: Patient unable to respond  Emotional Status Pt's affect,  behavior and adjustment status: Unable to respond at present Recent Psychosocial Issues: Unable to assess Psychiatric History: Unable to assess Substance Abuse History: Unable to assess  Patient / Family Perceptions, Expectations & Goals Pt/Family understanding of illness & functional limitations: Family understanding of illness & functional limitations Premorbid pt/family roles/activities: Active with caring for mother with dementia Anticipated changes in roles/activities/participation: Unknown Pt/family expectations/goals: Web designer referrals recommended: Neuropsychology  Discharge Planning Living Arrangements: Spouse/significant other, Other relatives (Lives with spouse, but provides care to mother along with sister, [name]) Support Systems: Spouse/significant other Type of Residence: Private residence Community education officer Resources: Media planner (specify) (AETNA / AETNA NAP) Financial Resources: Other (Comment) (Unknown at present) Financial Screen Referred: No Living Expenses: Rent Money Management: Patient, Spouse Does the patient have any problems obtaining your medications?: No Home Management: Patient/spouse Patient/Family Preliminary Plans: Patient plans to return back home Care Coordinator Anticipated Follow Up Needs: HH/OP Expected length of stay: 6-9 days  Clinical Impression CSW met with patient/family to introduce herself and complete initial assessment. Patient is a 63 y/o individual who admitted to [facility] following sepsis, feculent peritonitis, perforated sigmoid colon s/p Hartman's. She is not oriented as she kept going in and out of sleep while being asked questions. When asked her birthday she stated another date - when asked what today's date was she stated another date. She is unable to make needs known at present. This SW made contact with [name] who works as a Naval architect. He reports that they live together and her sister [name] resides beside them  where she cares for their mother - [name] assists as well. [name] reported that all correspondence should go through [facility] as he will not be there 24/7. Currently Kesia has a wound vac - therapy recommending family education. DME: has a hurrycane, and bariatric RW at home. Transportation will be provided by [name] upon discharge. There were no further needs or concerns at present. CSW will follow up with family and continue to follow.   UPDATE: Was able to speak with patient this morning. Everything stated from above was confirmed.  [name]  [name] 10/22/2023, 2:14 PM

## 2023-10-22 NOTE — Plan of Care (Signed)
  Problem: Consults Goal: RH GENERAL PATIENT EDUCATION Description: See Patient Education module for education specifics. Outcome: Progressing Goal: Skin Care Protocol Initiated - if Braden Score 18 or less Description: If consults are not indicated, leave blank or document N/A Outcome: Progressing Goal: Nutrition Consult-if indicated Outcome: Progressing   Problem: RH BOWEL ELIMINATION Goal: RH STG MANAGE BOWEL WITH ASSISTANCE Description: STG Manage Bowel with mod I Assistance. Outcome: Progressing   Problem: RH SKIN INTEGRITY Goal: RH STG SKIN FREE OF INFECTION/BREAKDOWN Description: Manage w min assist Outcome: Progressing   Problem: RH SAFETY Goal: RH STG ADHERE TO SAFETY PRECAUTIONS W/ASSISTANCE/DEVICE Description: STG Adhere to Safety Precautions With cues Assistance/Device. Outcome: Progressing   Problem: RH PAIN MANAGEMENT Goal: RH STG PAIN MANAGED AT OR BELOW PT'S PAIN GOAL Description: Pain < 4 with prns Outcome: Progressing   Problem: RH KNOWLEDGE DEFICIT GENERAL Goal: RH STG INCREASE KNOWLEDGE OF SELF CARE AFTER HOSPITALIZATION Description: Manage care using educational resources for medications and dietary modification independently Outcome: Progressing   Problem: Education: Goal: Knowledge of ostomy care will improve Outcome: Progressing Goal: Understanding of discharge needs will improve Outcome: Progressing   Problem: Bowel/Gastric/Urinary: Goal: Gastrointestinal status for postoperative course will improve Outcome: Progressing   Problem: Coping: Goal: Coping ability will improve Outcome: Progressing   Problem: Health Behavior/Discharge Planning: Goal: Ability to manage health-related needs will improve Outcome: Progressing   Problem: Nutrition: Goal: Will attain and maintain optimal nutritional status will improve Outcome: Progressing

## 2023-10-22 NOTE — Progress Notes (Signed)
 Physical Therapy Session Note  Patient Details  Name: [name] MRN: [id number] Date of Birth: February 05, 1960  Today's Date: 10/22/2023 PT Individual Time: 1345-1406, 0900-1000 PT Individual Time Calculation (min): 21 min, 60 min and Today's Date: 10/22/2023 PT Missed Time: 39 Minutes Missed Time Reason: Patient fatigue  Short Term Goals: Week 1:  PT Short Term Goal 1 (Week 1): STG=LTG 2/2 ELOS  Skilled Therapeutic Interventions/Progress Updates:      Treatment Session 1  Pt supine in bed upon arrival. Pt agreeable to therapy. Pt denies any pain.   Nurse present to adminster medications during session.   Pt overall requires frequent redirection to task as pt easily distracted and high anxiety.   PT managing wound vac and IV.   Pt requesting to use bathroom. Pt endorsing need to empty ostomy bag. Pt performed stand pivot transfer with RW and CGA/close supervision. Pt continent of bladder. Pt emptied ostomy bag with min A with education and encouragement from PT-verabl cues provided for sequencing technique.   Pt ambulated 146 feet with RW and CGA progressing to close supervision, verbal cues provided intermittently for safety with RW.   Pt ambulated ~20 feet with no AD and R HHA and +2 for management of IV/wound vac, verbal cues provided for reciprocal gait.   Pt seated in WC at end of session with all needs within reach and chair alarm on.   Treatment Session 2   Pt supine in deep sleep upon arrival. PT attempted to awake pt--however pt very lethargic-intermittently responding but incomprehensible and pt drifting back to sleep  and snoring with intermittent B UE arm tremors--despite efforts to wake up pt  Assessed vitals: BP 120/65 HR 73  Assessed orientation: pt able to clearaly say her name, however pt inaccurately reporting her birthday, and time.   Nurse unavailable at the time. Notified PA --as this is different presentation overall in comparison to this morning and  yesterdays sessions. PA present to assess pt and cleared pt to continue PT as tolerated however pt notably increasingly fatigued this afternoon.   Pt missed 39 min 2/2 fatigue. Will attempt to make up missed minutes as able.   Therapy Documentation Precautions:  Precautions Precautions: Fall Recall of Precautions/Restrictions: Impaired Precaution/Restrictions Comments: Wound vac, abdominal protection precs; LLQ JP drain, ostomy, IV antibiotics Restrictions Weight Bearing Restrictions Per Provider Order: No  Therapy/Group: Individual Therapy  [facility] [place], , DPT  10/22/2023, 10:28 AM

## 2023-10-22 NOTE — Progress Notes (Addendum)
 Patient with worsening of cough and nausea. Took Nexium at home--PPI resumed. Fatigued and falling asleep --had difficulty staying awake to complete convesation. Able to answer basic orientation questions but reports being tired. Intermittent cough noted--likely untreated GERD but will check CXR. Has been on incruse since admission. Chest CT 04/2023 with emphysema and small airway disease. Encourage pulmonary hygiene.  Intermittent upper airway wheeze noted.  Weight on upward trend--rule out CHF. Will dc compazine as question cause of sedation.

## 2023-10-22 NOTE — Progress Notes (Signed)
 Inpatient Rehabilitation Center Individual Statement of Services  Patient Name:  [name]  Date:  10/22/2023  Welcome to the Inpatient Rehabilitation Center.  Our goal is to provide you with an individualized program based on your diagnosis and situation, designed to meet your specific needs.  With this comprehensive rehabilitation program, you will be expected to participate in at least 3 hours of rehabilitation therapies Monday-Friday, with modified therapy programming on the weekends.  Your rehabilitation program will include the following services:  Physical Therapy (PT), Occupational Therapy (OT), Speech Therapy (ST), 24 hour per day rehabilitation nursing, Therapeutic Recreaction (TR), Psychology, Neuropsychology, Care Coordinator, Rehabilitation Medicine, Nutrition Services, and Pharmacy Services  Weekly team conferences will be held on Wednesday to discuss your progress.  Your Inpatient Rehabilitation Care Coordinator will talk with you frequently to get your input and to update you on team discussions.  Team conferences with you and your family in attendance may also be held.  Expected length of stay: 8 days  Overall anticipated outcome: Independent with assistive device   Depending on your progress and recovery, your program may change. Your Inpatient Rehabilitation Care Coordinator will coordinate services and will keep you informed of any changes. Your Inpatient Rehabilitation Care Coordinator's name and contact numbers are listed  below.  The following services may also be recommended but are not provided by the Inpatient Rehabilitation Center:  Driving Evaluations Home Health Rehabiltiation Services Outpatient Rehabilitation Services Vocational Rehabilitation   Arrangements will be made to provide these services after discharge if needed.  Arrangements include referral to agencies that provide these services.  Your insurance has been verified to be:  HULAN / AETNA NAP   Your primary doctor is:  Lynwood Laneta ORN, PA-C  Pertinent information will be shared with your doctor and your insurance company.  Inpatient Rehabilitation Care Coordinator:  [name] Loreli SIERRAS [phone] or ELIGAH BRINKS  Information discussed with and copy given to patient by: [name], 10/22/2023, 2:21 PM

## 2023-10-22 NOTE — Plan of Care (Signed)
  Problem: Consults Goal: RH GENERAL PATIENT EDUCATION Description: See Patient Education module for education specifics. Outcome: Progressing Goal: Skin Care Protocol Initiated - if Braden Score 18 or less Description: If consults are not indicated, leave blank or document N/A Outcome: Progressing Goal: Nutrition Consult-if indicated Outcome: Progressing   Problem: RH BOWEL ELIMINATION Goal: RH STG MANAGE BOWEL WITH ASSISTANCE Description: STG Manage Bowel with mod I Assistance. Outcome: Progressing   Problem: RH SKIN INTEGRITY Goal: RH STG SKIN FREE OF INFECTION/BREAKDOWN Description: Manage w min assist Outcome: Progressing   Problem: RH SAFETY Goal: RH STG ADHERE TO SAFETY PRECAUTIONS W/ASSISTANCE/DEVICE Description: STG Adhere to Safety Precautions With cues Assistance/Device. Outcome: Progressing   Problem: RH PAIN MANAGEMENT Goal: RH STG PAIN MANAGED AT OR BELOW PT'S PAIN GOAL Description: Pain < 4 with prns Outcome: Progressing   Problem: RH KNOWLEDGE DEFICIT GENERAL Goal: RH STG INCREASE KNOWLEDGE OF SELF CARE AFTER HOSPITALIZATION Description: Manage care using educational resources for medications and dietary modification independently Outcome: Progressing   Problem: Education: Goal: Knowledge of ostomy care will improve Outcome: Progressing Goal: Understanding of discharge needs will improve Outcome: Progressing   Problem: Bowel/Gastric/Urinary: Goal: Gastrointestinal status for postoperative course will improve Outcome: Progressing

## 2023-10-22 NOTE — Progress Notes (Signed)
 PROGRESS NOTE   Subjective/Complaints: Patient reports she slept better last night.  Continues to have anxiety.  She has continued discomfort around her abdominal wounds.  Reports continued occasional cough, does not tolerate guaifenesin.  ROS: as per HPI. Denies CP, SOB, abd pain, N/V/D/C, or any other complaints at this time.   +anxious +insomnia- improved +cough  Objective:   No results found. Recent Labs    10/20/23 0401 10/22/23 0429  WBC 11.9* 9.1  HGB 8.6* 8.8*  HCT 27.9* 28.1*  PLT 417* 484*   Recent Labs    10/20/23 0401 10/22/23 0429  NA 138 138  K 4.0 3.9  CL 104 104  CO2 24 25  GLUCOSE 81 81  BUN 19 17  CREATININE 0.98 1.12*  CALCIUM  8.1* 8.6*        Intake/Output Summary (Last 24 hours) at 10/22/2023 1247 Last data filed at 10/22/2023 1154 Gross per 24 hour  Intake 1156.01 ml  Output 1122.5 ml  Net 33.51 ml        Physical Exam: Vital Signs Blood pressure (!) 114/55, pulse 76, temperature 97.7 F (36.5 C), temperature source Oral, resp. rate 19, height 5' 8.5 (1.74 m), weight 124.8 kg, SpO2 95%.  General: Laying in bed, appears comfortable overall Mood and affect are anxious Heart: Regular rate and rhythm no rubs murmurs or extra sounds Lungs: Clear to auscultation, breathing unlabored, no rales or wheezes Abdomen: LLQ colostomy with brown liquid stool in bag, abd drain in LLQ with wound vac with good seal.  Positive bowel sounds, soft, some tenderness to palpation around wounds, nondistended Extremities: No clubbing, cyanosis, or edema Skin: No evidence of breakdown, no evidence of rash over exposed surfaces aside from aforementioned wounds.   Neurologic: Cranial nerves II through XII intact, motor strength is 4/5 in bilateral deltoid, bicep, tricep, grip, 4- / 5hip flexor, knee extensors, ankle dorsiflexor and plantar flexor Sensory exam normal sensation to light touch and  proprioception in bilateral upper and lower extremities pt states she has neuropathy and her feet feel numb  Prior neuro assessment is c/w today's exam 10/22/2023.    Musculoskeletal: Full range of motion in all 4 extremities. No joint swelling  Assessment/Plan: 1. Functional deficits which require 3+ hours per day of interdisciplinary therapy in a comprehensive inpatient rehab setting. Physiatrist is providing close team supervision and 24 hour management of active medical problems listed below. Physiatrist and rehab team continue to assess barriers to discharge/monitor patient progress toward functional and medical goals  Care Tool:  Bathing    Body parts bathed by patient: Right arm, Left arm, Abdomen, Chest, Front perineal area, Buttocks, Right upper leg, Left upper leg, Face   Body parts bathed by helper: Right lower leg, Left lower leg     Bathing assist Assist Level: Minimal Assistance - Patient > 75%     Upper Body Dressing/Undressing Upper body dressing   What is the patient wearing?: Pull over shirt    Upper body assist Assist Level: Supervision/Verbal cueing    Lower Body Dressing/Undressing Lower body dressing      What is the patient wearing?: Pants, Underwear/pull up (pad)     Lower body  assist Assist for lower body dressing: Minimal Assistance - Patient > 75%     Toileting Toileting    Toileting assist Assist for toileting: Minimal Assistance - Patient > 75%     Transfers Chair/bed transfer  Transfers assist     Chair/bed transfer assist level: Minimal Assistance - Patient > 75%     Locomotion Ambulation   Ambulation assist      Assist level: Contact Guard/Touching assist Assistive device: Walker-rolling Max distance: 121   Walk 10 feet activity   Assist     Assist level: Contact Guard/Touching assist Assistive device: Walker-rolling   Walk 50 feet activity   Assist    Assist level: Contact Guard/Touching  assist Assistive device: Walker-rolling    Walk 150 feet activity   Assist Walk 150 feet activity did not occur: Safety/medical concerns (fatigue/SOB)         Walk 10 feet on uneven surface  activity   Assist Walk 10 feet on uneven surfaces activity did not occur: Safety/medical concerns (fatigue)         Wheelchair     Assist Is the patient using a wheelchair?: Yes Type of Wheelchair: Manual    Wheelchair assist level: Supervision/Verbal cueing Max wheelchair distance: 50    Wheelchair 50 feet with 2 turns activity    Assist        Assist Level: Supervision/Verbal cueing   Wheelchair 150 feet activity     Assist      Assist Level: Maximal Assistance - Patient 25 - 49%   Blood pressure (!) 114/55, pulse 76, temperature 97.7 F (36.5 C), temperature source Oral, resp. rate 19, height 5' 8.5 (1.74 m), weight 124.8 kg, SpO2 95%.  Medical Problem List and Plan: 1. 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 Advised pt on team conference and evals, how d/c planning works.  -Okay to discontinue IV 2.  Antithrombotics: -DVT/anticoagulation:  Pharmaceutical: Lovenox  40mg  daily             -antiplatelet therapy: N/A 3. Pain Management: Still using IV fentanyl  only for wound vac changes --continue tylenol  650mg  qid scheduled+PRN, Robaxin 1000 mg QID and oxycodone  prn, lidoderm  patches. Also on lyrica 50mg  BID 4. Mood/Behavior/Sleep: LCSW to follow for evaluation and support             --Trazodone prn  -takes Xanax  at home (states it's 0.5-1mg  TID PRN but always takes 1mg )--  also uses for sleep.  -10/21/23 updated xanax  indication for sleep if trazodone ineffective, discussed trying trazodone for sleep. Did NOT increase xanax  to avoid oversedation, and discussed that with pt.  -10/22/23 appears he slept better with as needed trazodone, continue current regimen and monitor             -antipsychotic agents: N/A 5. Neuropsych/cognition: This patient is capable of making decisions on her  own behalf. 6. Skin/Wound Care/ostomy LLQ: Wound VAC change twice a week by WOC             --continue  to encourage intake.  -10/21/23 very anxious about ostomy education-- might benefit to have WOC come an extra day per week to see her specifically about ostomy education.  -10/13 surgery okay with removal of drain per IR 7. Fluids/Electrolytes/Nutrition: monitor I&O and labs, continue vitamins and supplements              --Continue Ensure BID. Add Juven BID -10/21/23 Mg being checked daily, but has been WNL, 2.0 today. Will d/c daily checks, can check as needed.  8.  Abdominal abscess: Drain in place. Cultures negative. --per  CCS to continue Zosyn /Micafungin for 14 days with EOT 10/21/23 9.  Malnutrition: Po intake poor and TPN weaned off 10/10             --continue Ensure BID, juven 10. Hypotension: Continue midodrine 10 mg TID  -10/13 BP overall stable continue to monitor Vitals:   10/20/23 1309 10/20/23 1954 10/21/23 0356 10/21/23 1452  BP: 132/74 (!) 114/53 104/67 (!) 116/58   10/21/23 2130 10/22/23 0613  BP: (!) 142/62 (!) 114/55    11.  ANCA associated vasculitis/ILD:  Methotrexate  and Cellcept  on hold. Continue Incruse inhaler  - 10/13 discussed with surgery, hold methotrexate  and CellCept  for at least 4 weeks if possible unless urgent need 12.  T2DM: Hgb A1C- 4.6. Monitor BS. Was on Ozempic outpatient -10/21/23 pt states she's been on ozempic and her diabetes was under great control, obviously off of that now-- will add on CBG checks daily in the AM to monitor, if  noting rising fasting sugars, might need to check more often. Caution with supplements being carb heavy -10/13 controlled this morning CBG (last 3)  Recent Labs    10/19/23 1442 10/22/23 0621  GLUCAP 156* 81    13. Obesity: Educate on diet and exercise to help with mobility and overall health 14. H/o Depression: On prozac 40mg  daily and Wellbutrin  450mg  daily.  15. Bowel management: hx of profound chronic constipation, now with colostomy. Continue Colace 100mg  BID, miralax  daily. Monitor ostomy output.  16. ABLA/thrombocytosis: Hgb 8's, monitor routinely. Plt 417 likely reactive.   - 9/13 hemoglobin stable at 8.8/platelets 44K, continue to monitor 17.  Occasional cough  - Start as needed Delsym, patient reports she does not tolerate guaifenesin  LOS: 2 days A FACE TO FACE EVALUATION WAS PERFORMED  Murray Collier 10/22/2023, 12:47 PM

## 2023-10-23 DIAGNOSIS — J189 Pneumonia, unspecified organism: Secondary | ICD-10-CM

## 2023-10-23 LAB — GLUCOSE, CAPILLARY: Glucose-Capillary: 80 mg/dL (ref 70–99)

## 2023-10-23 MED ORDER — AMOXICILLIN-POT CLAVULANATE 875-125 MG PO TABS
1.0000 | ORAL_TABLET | Freq: Two times a day (BID) | ORAL | Status: AC
Start: 2023-10-23 — End: 2023-10-28
  Administered 2023-10-23 – 2023-10-27 (×10): 1 via ORAL
  Filled 2023-10-23 (×10): qty 1

## 2023-10-23 MED ORDER — PANTOPRAZOLE SODIUM 40 MG PO TBEC
40.0000 mg | DELAYED_RELEASE_TABLET | Freq: Every day | ORAL | Status: DC
  Administered 2023-10-23 – 2023-10-28 (×6): 40 mg via ORAL
  Filled 2023-10-23 (×2): qty 1
  Filled 2023-10-23: qty 2
  Filled 2023-10-23 (×3): qty 1

## 2023-10-23 NOTE — Consult Note (Signed)
 Placement of VAC dressing adjacent to colostomy; abdominal surgical wound Cleansed wound with normal saline x 2 Filled wound with  ___0 piece of Mepitel, ___0_ piece of white foam, ___1_ piece of black foam  Sealed NPWT dressing at HG continuous negative pressure, no alarms. Patient received IV/PO pain medication per bedside nurse prior to dressing change; oxy/IV narcotic Patient tolerated procedure well with pain medication, finds any pressure uncomfortable. Coordinated care with primary RN; supplies and visit time.    WOC nurse will continue to provide NPWT dressing changed due to the complexity of the dressing change on Tuesdays and Fridays. Patient could be switched to wet to dry dressing once wound becomes more shallow.   Wound type: Surgical Mid-abd open cavitye drainage  Drainage: serosanguinous Measurement: 14 x 5 x 3 cm depth Wound bed: pale pink, moist, no immediate, clear, no malodor, no structures of concern noted at base  Periwound: intact, no erythema, used barrier ring fan-fold method near umbilical area to improve VAC seal Dressing procedure/placement/frequency: VAC foam twice a week change foam exchange, applied stretched out barrier ring to create a barrier between ostomy and open wound. There has been issues with maintaining a good seal about VAC dressing.   WOC Nurse ostomy assessment Stoma type/location: LLQ Stomal assessment/size: 35 mm oblong, in deep crease Stoma: viable, moist, red, almost at skin level Peristomal assessment: sites of denuded skin, some erythema on edge, scar tissue noted upper peristomal plane Treatment options for stomal/peristomal skin: applied convex baseplate with 2 inch barrier ring, left stencil on dry board  Output: 150 mls semi-liquid brown effluent Enrolled patient in [employer] Discharge program: Yes  Ostomy Appliance: 1pc soft convex Gerlean # [id number]/barrier rings Lawson# [phone]   Education: explained procedure to  patient; verbalized understanding, but appears anxious, reviewed frequency of change of appliance, skin care needs, frequency of VAC change. Patient removed barrier residue around [facility] dressing on side, she saw her stoma. Patient stated that provider told her that her stoma will remain in place for up to 6 months.   10/23/23 ostomy

## 2023-10-23 NOTE — Progress Notes (Signed)
 Physical Therapy Session Note  Patient Details  Name: [name] MRN: [id number] Date of Birth: [birth year]  Today's Date: 10/23/2023 PT Individual Time: 1030-1134, 1305-1400 PT Individual Time Calculation (min): 64 min, 55 min  Short Term Goals: Week 1:  PT Short Term Goal 1 (Week 1): STG=LTG 2/2 ELOS  Skilled Therapeutic Interventions/Progress Updates:      Treatment 1:  Pt supine in bed upon arrival. Pt reports 7/10 pain in her stomach and legs and states she is trying to avoid taking pain medication but agreeable to therapy.  Session emphasized functional strength and endurance with ambulation, transfers, and standing balance activity.  Pt transported dependent in [facility] to main gym for time/energy conservation. Pt ambulated 1x ~30 ft using RW with and CGA and PT managing wound vac. Pt ambulated 1x ~15 ft without AD with L HHA and PT managing wound vac. Pt much more unsteady during second trial with HHA with one episode of LOB but able to recover with reaching balance reaction and min A via gait belt.  Pt worked on standing balance activity. Pt standing in RW reaching for cones and placing them on the mat on either side of her. Initially patient used 1 UE support while reaching with other hand and progressed to no UE support. CGA/close supervision required throughout activity.  Pt requested to use BSC upon return to room. Pt performed EOB <> BSC transfer using RW with close supervision and verbal cueing for sequencing of task. Pt able to perform toilet hygiene independently and manage clothing in standing position with CGA.  Pt required frequent verbal cueing and redirection to task with all mobility for safety as patient very anxious and highly distractible.  Pt supine in bed, bed alarm on, all needs in reach, and wound care nurse present upon PT exiting room.  Treatment 2:  Pt supine in bed upon arrival. Pt reports her pain to be over ten but has been premedicated and  agreeable to therapy. Rest breaks and repositioning provided. Pt had her wound vac and ostomy site changed since earlier session.  Session emphasized functional strength and endurance with ambulation and transfers.  Pt transported dependent in [facility] to ortho gym for time/energy conservation. Pt performed stand pivot transfer WC <> car simulator x 2 using RW with CGA progressing to close supervision and verbal cueing for sequencing and redirection to task. Brief seated rest break required between trials due to fatigue.  Pt ambulated 188 ft using RW with CGA progressing to close supervision (+2 for WC follow) and verbal cueing to remain close to RW for safety.  Pt reported some pain/discomfort in area of wounds vac with standing but stated it was bearable to participate in therapy. Nursing notified after session.  Pt requested to use BSC upon return to room. Pt performed EOB <> BSC transfer using RW with close supervision and verbal cueing for sequencing of task. Pt able to perform toilet hygiene independently and manage clothing in standing position with CGA/close supervision.  Pt required frequent verbal cueing and redirection to task with all mobility for safety as patient very anxious and highly distractible. PT also emphasized the importance of slowing down and taking her time when performing tasks with proper sequencing in order to ensure safety and reduce fall risk. Patient verbalized understanding.  Pt supine in bed, bed alarm on and all needs in reach upon PT exiting room.  Therapy Documentation Precautions:  Precautions Precautions: Fall Recall of Precautions/Restrictions: Impaired Precaution/Restrictions Comments: Wound vac, abdominal protection  precs; LLQ JP drain, ostomy, IV antibiotics Restrictions Weight Bearing Restrictions Per Provider Order: No  Therapy/Group: Individual Therapy  [name], DPT 10/23/2023, 2:06 PM

## 2023-10-23 NOTE — Progress Notes (Signed)
 Occupational Therapy Session Note  Patient Details  Name: [name] MRN: [id number] Date of Birth: 11-08-1960   Session 1: Today's Date: 10/23/2023 OT Individual Time: 9192-9097 OT Individual Time Calculation (min): 55 min   Session 2: Today's Date: 10/23/2023 OT Individual Time: 8582-8469 OT Individual Time Calculation (min): 73 min     Short Term Goals: Week 1:  OT Short Term Goal 1 (Week 1): LTG=STG mod I   Session 1 Skilled Therapeutic Interventions/Progress Updates:    Patient agreeable to participate in OT session. Reports 7/10 pain level.   Patient participated in skilled OT session focusing on self care tasks. Patient received in bed ready for therapy. Patient able to complete toileting with SUP A to CG with bsc. Completed UB bathing sitting in chair and UB dressing. Patient completed grooming sitting at sink with SU. Patient able to complete functional mobility around room with CGA with RW for increased safety. Patient left in room all needs in reach, family present.   Session 2 Skilled Therapeutic Interventions/Progress Updates:    Patient agreeable to participate in OT session. Reports 4/10 pain level from wound vac placement. Patient completed bed mobility and transfer to wc with CGA. Patient reports increased fatigue due to sessions today. Completed transfer to NuStep with CGA. Patient required min A for set up on NuStep due to LE weakness. Completed nu step for 20 mins at level 4, 40-45 spm pace, for 750 steps to increase activity tolerance an strength in LE and UE to maximize ability to complete UB and LB ADLs with good endurance and balance due to LE strength. Patient completed UE strengthening with 3 pound dowel rod in all planes of motion. Patient able to complete several stand pivot transfers without RW with CGA to increase dynamic balance and standing balance related to ADLs. Patient completed transfer to bed with SUP to CGA with bed rail. Sit to supine CGA to  minA, scooting up in bed SUP. Left in bed with all needs in reach. .   Therapy Documentation Precautions:  Precautions Precautions: Fall Recall of Precautions/Restrictions: Impaired Precaution/Restrictions Comments: Wound vac, abdominal protection precs; LLQ JP drain, ostomy, IV antibiotics Restrictions Weight Bearing Restrictions Per Provider Order: No  Therapy/Group: Individual Therapy  D'mariea L Sherri Mcarthy 10/23/2023, 7:26 AM

## 2023-10-23 NOTE — Progress Notes (Signed)
 PROGRESS NOTE   Subjective/Complaints: Cough is improved today. Still feels a little tired.  Continues to have pain around her abdominal wound, but overall under control.  She is only using oxycodone  about once a day.  ROS: as per HPI. Denies CP, SOB, abd pain, N/V/D/C, or any other complaints at this time.   +anxious +insomnia- improved +cough- improved  Objective:   DG Chest 2 View Result Date: 10/22/2023 CLINICAL DATA:  Cough EXAM: CHEST - 2 VIEW COMPARISON:  10/11/2023 FINDINGS: Airspace opacity in the left lower lobe concerning for pneumonia. Right lung is clear. Heart mediastinal contours within normal limits. No definite effusion. Right PICC line tip in the upper SVC. IMPRESSION: New left lower lobe airspace opacity concerning for pneumonia. Electronically Signed   By: Franky Crease M.D.   On: 10/22/2023 20:27   Recent Labs    10/22/23 0429  WBC 9.1  HGB 8.8*  HCT 28.1*  PLT 484*   Recent Labs    10/22/23 0429  NA 138  K 3.9  CL 104  CO2 25  GLUCOSE 81  BUN 17  CREATININE 1.12*  CALCIUM  8.6*        Intake/Output Summary (Last 24 hours) at 10/23/2023 1142 Last data filed at 10/23/2023 0700 Gross per 24 hour  Intake 870 ml  Output 1232.5 ml  Net -362.5 ml        Physical Exam: Vital Signs Blood pressure (!) 117/56, pulse 82, temperature 98 F (36.7 C), temperature source Oral, resp. rate 18, height 5' 8.5 (1.74 m), weight 120.7 kg, SpO2 98%.  General: Laying in bed, appears comfortable overall Heart: Regular rate and rhythm no rubs murmurs or extra sounds Lungs: Clear to auscultation, breathing unlabored, no rales or wheezes Abdomen: LLQ colostomy with brown liquid stool in bag, abd drain in LLQ with wound vac with good seal.  Positive bowel sounds, soft, some tenderness to palpation around wounds, nondistended + Percutaneous drain with minimal fluid in the bulb Extremities: No clubbing,  cyanosis, or edema Skin: No evidence of breakdown, no evidence of rash over exposed surfaces aside from aforementioned wounds.   Neurologic: Cranial nerves II through XII intact, motor strength is 4/5 in bilateral deltoid, bicep, tricep, grip, 4- / 5hip flexor, knee extensors, ankle dorsiflexor and plantar flexor Sensory exam normal sensation to light touch and proprioception in bilateral upper and lower extremities pt states she has neuropathy and her feet feel numb  Prior neuro assessment is c/w today's exam 10/23/2023.    Musculoskeletal: Full range of motion in all 4 extremities. No joint swelling  Assessment/Plan: 1. Functional deficits which require 3+ hours per day of interdisciplinary therapy in a comprehensive inpatient rehab setting. Physiatrist is providing close team supervision and 24 hour management of active medical problems listed below. Physiatrist and rehab team continue to assess barriers to discharge/monitor patient progress toward functional and medical goals  Care Tool:  Bathing    Body parts bathed by patient: Right arm, Left arm, Abdomen, Chest, Front perineal area, Buttocks, Right upper leg, Left upper leg, Face   Body parts bathed by helper: Right lower leg, Left lower leg     Bathing assist Assist Level:  Minimal Assistance - Patient > 75%     Upper Body Dressing/Undressing Upper body dressing   What is the patient wearing?: Pull over shirt    Upper body assist Assist Level: Supervision/Verbal cueing    Lower Body Dressing/Undressing Lower body dressing      What is the patient wearing?: Pants, Underwear/pull up (pad)     Lower body assist Assist for lower body dressing: Minimal Assistance - Patient > 75%     Toileting Toileting    Toileting assist Assist for toileting: Minimal Assistance - Patient > 75%     Transfers Chair/bed transfer  Transfers assist     Chair/bed transfer assist level: Minimal Assistance - Patient > 75%      Locomotion Ambulation   Ambulation assist      Assist level: Contact Guard/Touching assist Assistive device: Walker-rolling Max distance: 121   Walk 10 feet activity   Assist     Assist level: Contact Guard/Touching assist Assistive device: Walker-rolling   Walk 50 feet activity   Assist    Assist level: Contact Guard/Touching assist Assistive device: Walker-rolling    Walk 150 feet activity   Assist Walk 150 feet activity did not occur: Safety/medical concerns (fatigue/SOB)         Walk 10 feet on uneven surface  activity   Assist Walk 10 feet on uneven surfaces activity did not occur: Safety/medical concerns (fatigue)         Wheelchair     Assist Is the patient using a wheelchair?: Yes Type of Wheelchair: Manual    Wheelchair assist level: Supervision/Verbal cueing Max wheelchair distance: 50    Wheelchair 50 feet with 2 turns activity    Assist        Assist Level: Supervision/Verbal cueing   Wheelchair 150 feet activity     Assist      Assist Level: Maximal Assistance - Patient 25 - 49%   Blood pressure (!) 117/56, pulse 82, temperature 98 F (36.7 C), temperature source Oral, resp. rate 18, height 5' 8.5 (1.74 m), weight 120.7 kg, SpO2 98%.  Medical Problem List and Plan: 1. 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 Advised pt on team conference and evals, how d/c planning works.  -Team conference tomorrow 2.  Antithrombotics: -DVT/anticoagulation:  Pharmaceutical: Lovenox  40mg  daily             -antiplatelet therapy: N/A 3. Pain  Management: Still using IV fentanyl  only for wound vac changes --continue tylenol  650mg  qid scheduled+PRN, Robaxin 1000 mg QID and oxycodone  prn, lidoderm  patches. Also on lyrica 50mg  BID 4. Mood/Behavior/Sleep: LCSW to follow for evaluation and support             --Trazodone prn  -takes Xanax  at home (states it's 0.5-1mg  TID PRN but always takes 1mg )-- also uses for sleep.  -10/21/23 updated xanax  indication for sleep if trazodone ineffective, discussed trying trazodone for sleep. Did NOT increase xanax  to avoid oversedation, and discussed that with pt.  -10/22/23 appears he slept  better with as needed trazodone, continue current regimen and monitor -10/14 continue trazodone appears to be helping for sleep             -antipsychotic agents: N/A 5. Neuropsych/cognition: This patient is capable of making decisions on her  own behalf. 6. Skin/Wound Care/ostomy LLQ: Wound VAC change twice a week by WOC             --continue to encourage intake.  -10/21/23 very anxious about ostomy education-- might benefit to have WOC come an extra day per week to see her specifically about ostomy education.  -10/13 surgery PA Almarie Pringle okay with removal of drain per IR -10/14 called IR regarding drain removal 7. Fluids/Electrolytes/Nutrition: monitor I&O and labs, continue vitamins and supplements              --Continue Ensure BID. Add Juven BID -10/21/23 Mg being checked daily, but has been WNL, 2.0 today. Will d/c daily checks, can check as needed.  8.  Abdominal abscess: Drain in place. Cultures negative. --per  CCS to continue Zosyn /Micafungin for 14 days with EOT 10/21/23 9.  Malnutrition: Po intake poor and TPN weaned off 10/10             --continue Ensure BID, juven 10. Hypotension: Continue midodrine 10 mg TID  -10/13-14 BP overall stable continue to monitor Vitals:   10/20/23 1309 10/20/23 1954 10/21/23 0356 10/21/23 1452  BP: 132/74 (!) 114/53 104/67 (!) 116/58   10/21/23 2130 10/22/23  0613 10/22/23 1304 10/22/23 2006  BP: (!) 142/62 (!) 114/55 (!) 106/42 (!) 134/52   10/23/23 0401  BP: (!) 117/56    11.  ANCA associated vasculitis/ILD:  Methotrexate  and Cellcept  on hold. Continue Incruse inhaler  - 10/13 discussed with surgery, hold methotrexate  and CellCept  for at least 4 weeks if possible unless urgent need 12.  T2DM: Hgb A1C- 4.6. Monitor BS. Was on Ozempic outpatient -10/21/23 pt states she's been on ozempic and her diabetes was under great control, obviously off of that now-- will add on CBG checks daily in the AM to monitor, if noting rising fasting sugars, might need to check more often. Caution with supplements being carb heavy -10/13-14 controlled this morning CBG (last 3)  Recent Labs    10/22/23 0621 10/23/23 0623  GLUCAP 81 80    13. Obesity: Educate on diet and exercise to help with mobility and overall health 14. H/o Depression: On prozac 40mg  daily and Wellbutrin  450mg  daily.  15. Bowel management: hx of profound chronic constipation, now with colostomy. Continue Colace 100mg  BID, miralax  daily. Monitor ostomy output.  16. ABLA/thrombocytosis: Hgb 8's, monitor routinely. Plt 417 likely reactive.   - 9/13 hemoglobin stable at 8.8/platelets 44K, continue to monitor 17.  Pneumonia  - Start as needed Delsym, patient reports she does not tolerate guaifenesin  - 10/14 start antibiotics for left lower lobe airspace opacity, discussed with pharmacy Augmentin  for 5 days.  LOS: 3 days A FACE TO FACE EVALUATION WAS PERFORMED  Murray Collier 10/23/2023, 11:42 AM

## 2023-10-23 NOTE — Plan of Care (Signed)
  Problem: Consults Goal: RH GENERAL PATIENT EDUCATION Description: See Patient Education module for education specifics. Outcome: Progressing Goal: Skin Care Protocol Initiated - if Braden Score 18 or less Description: If consults are not indicated, leave blank or document N/A Outcome: Progressing Goal: Nutrition Consult-if indicated Outcome: Progressing   Problem: RH BOWEL ELIMINATION Goal: RH STG MANAGE BOWEL WITH ASSISTANCE Description: STG Manage Bowel with mod I Assistance. Outcome: Progressing   Problem: RH SKIN INTEGRITY Goal: RH STG SKIN FREE OF INFECTION/BREAKDOWN Description: Manage w min assist Outcome: Progressing   Problem: RH SAFETY Goal: RH STG ADHERE TO SAFETY PRECAUTIONS W/ASSISTANCE/DEVICE Description: STG Adhere to Safety Precautions With cues Assistance/Device. Outcome: Progressing   Problem: RH PAIN MANAGEMENT Goal: RH STG PAIN MANAGED AT OR BELOW PT'S PAIN GOAL Description: Pain < 4 with prns Outcome: Progressing   Problem: RH KNOWLEDGE DEFICIT GENERAL Goal: RH STG INCREASE KNOWLEDGE OF SELF CARE AFTER HOSPITALIZATION Description: Manage care using educational resources for medications and dietary modification independently Outcome: Progressing   Problem: Education: Goal: Knowledge of ostomy care will improve Outcome: Progressing Goal: Understanding of discharge needs will improve Outcome: Progressing   Problem: Bowel/Gastric/Urinary: Goal: Gastrointestinal status for postoperative course will improve Outcome: Progressing   Problem: Coping: Goal: Coping ability will improve Outcome: Progressing   Problem: Health Behavior/Discharge Planning: Goal: Ability to manage health-related needs will improve Outcome: Progressing   Problem: Nutrition: Goal: Will attain and maintain optimal nutritional status will improve Outcome: Progressing

## 2023-10-23 NOTE — IPOC Note (Signed)
 Overall Plan of Care [facility] Patient Details Name: [name] MRN: [id number] DOB: 10/29/1960  Admitting Diagnosis: Debility  Hospital Problems: Principal Problem:   Debility     Functional Problem List: Nursing Bowel, Safety, Endurance, Medication Management, Pain, Skin Integrity  PT Balance, Edema, Motor, Pain, Safety, Skin Integrity, Behavior, Endurance, Nutrition, Sensory  OT Balance, Endurance, Motor, Safety, Pain  SLP    TR         Basic ADL's: OT Bathing, Dressing, Toileting     Advanced  ADL's: OT None     Transfers: PT Bed Mobility, Car, Bed to Chair  OT Toilet     Locomotion: PT Ambulation     Additional Impairments: OT None  SLP        TR      Anticipated Outcomes Item Anticipated Outcome  Self Feeding n/a  Swallowing      Basic self-care  mod I  Toileting  mod I   Bathroom Transfers mod I  Bowel/Bladder  manage bowel w min assist  Transfers  mod I  Locomotion  mod I  Communication     Cognition     Pain  Pain < 4 with prns  Safety/Judgment  manage safety w cues   Therapy Plan: PT Intensity: Minimum of 1-2 x/day ,45 to 90 minutes PT Frequency: 5 out of 7 days PT Duration Estimated Length of Stay: 7-10 days OT Intensity: Minimum of 1-2 x/day, 45 to 90 minutes OT Frequency: 5 out of 7 days OT Duration/Estimated Length of Stay: ~6-9days     Team Interventions: Nursing Interventions Patient/Family Education, Pain Management, Medication Management, Bowel Management, Disease Management/Prevention, Discharge Planning, Skin Care/Wound Management  PT interventions Ambulation/gait training, Balance/vestibular training, Cognitive remediation/compensation, Community reintegration, Discharge planning, Disease management/prevention, DME/adaptive equipment instruction, Functional mobility training, Neuromuscular re-education, Pain management, Patient/family education, Psychosocial support, Skin care/wound management, Splinting/orthotics,  Stair training, Therapeutic Activities, Therapeutic Exercise, UE/LE Strength taining/ROM, UE/LE Coordination activities, Visual/perceptual remediation/compensation, Wheelchair propulsion/positioning  OT Interventions Balance/vestibular training, Disease mangement/prevention, Self Care/advanced ADL retraining, Therapeutic Exercise, DME/adaptive equipment instruction, Pain management, Skin care/wound managment, UE/LE Strength taining/ROM, Patient/family education, Splinting/orthotics, UE/LE Coordination activities, Discharge planning, Functional mobility training, Psychosocial support, Therapeutic Activities  SLP Interventions    TR Interventions    SW/CM Interventions Disease Management/Prevention, Patient/Family Education, Psychosocial Support, Discharge Planning   Barriers to Discharge MD  Medical stability and Wound care  Nursing Decreased caregiver support, Home environment access/layout, Wound Care 1 level mobile home w sister  PT Decreased caregiver support, Wound Care, Weight, Other (comments) JP drain, ostomy, wound vac, IV antibiotics  OT IV antibiotics    SLP      SW       Team Discharge Planning: Destination: PT-Home ,OT- Home , SLP-  Projected Follow-up: PT-Home health PT, OT-  None, SLP-  Projected Equipment Needs: PT-To be determined, OT- To be determined, SLP-  Equipment Details: PT-pt has cane, bariatric RW, OT-  Patient/family involved in discharge planning: PT- Patient,  OT-Patient, SLP-   MD ELOS: 7-10 days Medical Rehab Prognosis:  Excellent Assessment: The patient has been admitted for CIR therapies with the diagnosis of 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 Rubin . The team will be addressing functional mobility, strength, stamina, balance, safety, adaptive techniques and equipment, self-care, bowel and bladder mgt, patient and caregiver education. Goals have been set at Mod I. Anticipated discharge destination is  Home.        See Team Conference Notes for  weekly updates to the plan of care

## 2023-10-23 NOTE — Progress Notes (Signed)
 Patient ID: [name], female   DOB: Oct 09, 1960, 63 y.o.   MRN: [id number]  Statement of service delivered.

## 2023-10-23 NOTE — Progress Notes (Signed)
 Add Augmentin  875mg  PO BID x 5 days for new PNA per Dr. Urbano.  Sergio Batch, PharmD, BCIDP, AAHIVP, CPP Infectious Disease Pharmacist 10/23/2023 12:50 PM

## 2023-10-24 DIAGNOSIS — T148XXA Other injury of unspecified body region, initial encounter: Secondary | ICD-10-CM

## 2023-10-24 MED ORDER — ONDANSETRON 4 MG PO TBDP
4.0000 mg | ORAL_TABLET | Freq: Three times a day (TID) | ORAL | Status: DC
  Administered 2023-10-24 – 2023-10-28 (×12): 4 mg via ORAL
  Filled 2023-10-24 (×12): qty 1

## 2023-10-24 MED ORDER — ONDANSETRON 4 MG PO TBDP: 4.0000 mg | ORAL_TABLET | Freq: Three times a day (TID) | ORAL | Status: DC | PRN

## 2023-10-24 NOTE — Progress Notes (Addendum)
 PROGRESS NOTE   Subjective/Complaints: Seen by WOC yesterday.  Reports continues to have some discomfort in her abdominal wound.  Cough is improving.  He is unhappy with requirement of having a ostomy.  ROS: as per HPI. Denies CP, SOB, abd pain, N/V/D/C, or any other complaints at this time.   +anxious-improved +insomnia- improved +cough- improving  Objective:   DG Chest 2 View Result Date: 10/22/2023 CLINICAL DATA:  Cough EXAM: CHEST - 2 VIEW COMPARISON:  10/11/2023 FINDINGS: Airspace opacity in the left lower lobe concerning for pneumonia. Right lung is clear. Heart mediastinal contours within normal limits. No definite effusion. Right PICC line tip in the upper SVC. IMPRESSION: New left lower lobe airspace opacity concerning for pneumonia. Electronically Signed   By: Franky Crease M.D.   On: 10/22/2023 20:27   Recent Labs    10/22/23 0429  WBC 9.1  HGB 8.8*  HCT 28.1*  PLT 484*   Recent Labs    10/22/23 0429  NA 138  K 3.9  CL 104  CO2 25  GLUCOSE 81  BUN 17  CREATININE 1.12*  CALCIUM  8.6*        Intake/Output Summary (Last 24 hours) at 10/24/2023 0846 Last data filed at 10/24/2023 0807 Gross per 24 hour  Intake 1340 ml  Output 487.5 ml  Net 852.5 ml        Physical Exam: Vital Signs Blood pressure (!) 104/52, pulse 72, temperature 98.6 F (37 C), temperature source Oral, resp. rate 16, height 5' 8.5 (1.74 m), weight 121.6 kg, SpO2 94%.  General: Laying in bed, appears comfortable overall Heart: Regular rate and rhythm no rubs murmurs or extra sounds Lungs: Clear to auscultation, breathing unlabored, no rales or wheezes Abdomen: LLQ colostomy moderate amount of brown liquid stool in bag, abd drain in LLQ with wound vac with good seal.  Positive bowel sounds, soft, some tenderness to palpation around wounds, nondistended + Percutaneous drain with minimal fluid in the bulb Extremities: No clubbing,  cyanosis, or edema Skin: No evidence of breakdown, no evidence of rash over exposed surfaces aside from aforementioned wounds.   Neurologic: Cranial nerves II through XII intact, motor strength is 4/5 in bilateral deltoid, bicep, tricep, grip, 4- / 5hip flexor, knee extensors, ankle dorsiflexor and plantar flexor Sensory exam normal sensation to light touch and proprioception in bilateral upper and lower extremities pt states she has neuropathy and her feet feel numb  Prior neuro assessment is c/w today's exam 10/24/2023.    Musculoskeletal: Full range of motion in all 4 extremities. No joint swelling  PICC line right upper extremity  Assessment/Plan: 1. Functional deficits which require 3+ hours per day of interdisciplinary therapy in a comprehensive inpatient rehab setting. Physiatrist is providing close team supervision and 24 hour management of active medical problems listed below. Physiatrist and rehab team continue to assess barriers to discharge/monitor patient progress toward functional and medical goals  Care Tool:  Bathing    Body parts bathed by patient: Right arm, Left arm, Abdomen, Chest, Front perineal area, Buttocks, Right upper leg, Left upper leg, Face   Body parts bathed by helper: Right lower leg, Left lower leg  Bathing assist Assist Level: Minimal Assistance - Patient > 75%     Upper Body Dressing/Undressing Upper body dressing   What is the patient wearing?: Pull over shirt    Upper body assist Assist Level: Supervision/Verbal cueing    Lower Body Dressing/Undressing Lower body dressing      What is the patient wearing?: Pants, Underwear/pull up (pad)     Lower body assist Assist for lower body dressing: Minimal Assistance - Patient > 75%     Toileting Toileting    Toileting assist Assist for toileting: Minimal Assistance - Patient > 75%     Transfers Chair/bed transfer  Transfers assist     Chair/bed transfer assist level: Minimal  Assistance - Patient > 75%     Locomotion Ambulation   Ambulation assist      Assist level: Contact Guard/Touching assist Assistive device: Walker-rolling Max distance: 121   Walk 10 feet activity   Assist     Assist level: Contact Guard/Touching assist Assistive device: Walker-rolling   Walk 50 feet activity   Assist    Assist level: Contact Guard/Touching assist Assistive device: Walker-rolling    Walk 150 feet activity   Assist Walk 150 feet activity did not occur: Safety/medical concerns (fatigue/SOB)         Walk 10 feet on uneven surface  activity   Assist Walk 10 feet on uneven surfaces activity did not occur: Safety/medical concerns (fatigue)         Wheelchair     Assist Is the patient using a wheelchair?: Yes Type of Wheelchair: Manual    Wheelchair assist level: Supervision/Verbal cueing Max wheelchair distance: 50    Wheelchair 50 feet with 2 turns activity    Assist        Assist Level: Supervision/Verbal cueing   Wheelchair 150 feet activity     Assist      Assist Level: Maximal Assistance - Patient 25 - 49%   Blood pressure (!) 104/52, pulse 72, temperature 98.6 F (37 C), temperature source Oral, resp. rate 16, height 5' 8.5 (1.74 m), weight 121.6 kg, SpO2 94%.  Medical Problem List and Plan: 1. 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 Advised pt on team conference and evals, how d/c planning works.  -Team conference today please see physician documentation under team conference tab, met with team  to discuss  problems,progress, and goals. Formulized individual treatment plan based on medical history, underlying problem and comorbidities.   2.  Antithrombotics: -DVT/anticoagulation:  Pharmaceutical: Lovenox  40mg  daily             -antiplatelet therapy: N/A 3. Pain Management: Still using IV fentanyl  only for wound vac changes --continue tylenol  650mg  qid scheduled+PRN, Robaxin 1000 mg QID and oxycodone  prn, lidoderm  patches. Also on lyrica 50mg  BID -10/15 patient reports pain is overall controlled with oxycodone , she tries not to use this very frequently.  Continue current regimen 4. Mood/Behavior/Sleep: LCSW to follow for evaluation and support             --  Trazodone prn  -takes Xanax  at home (states it's 0.5-1mg  TID PRN but always takes 1mg )-- also uses for sleep.  -10/21/23 updated xanax  indication for sleep if trazodone ineffective, discussed trying trazodone for sleep. Did NOT increase xanax  to avoid oversedation, and discussed that with pt.  -10/22/23 appears he slept better with as needed trazodone, continue current regimen and monitor -10/14 continue trazodone appears to be helping for sleep             -antipsychotic agents: N/A 5. Neuropsych/cognition: This patient is capable of making decisions on her  own behalf. 6. Skin/Wound Care/ostomy LLQ: Wound VAC change twice a week by WOC             --continue to encourage intake.  -10/21/23 very anxious about ostomy education-- might benefit to have WOC come an extra day per week to see her specifically about ostomy education.  -10/13 surgery PA Almarie Pringle okay with removal of drain per IR -10/14 called IR regarding drain removal -10/15 WOC possible change to wet to dry if wound becomes more shallow 7. Fluids/Electrolytes/Nutrition: monitor I&O and labs, continue vitamins and supplements              --Continue Ensure BID. Add Juven BID -10/21/23 Mg being checked daily, but has been WNL, 2.0 today. Will d/c daily checks, can check as  needed.  8.  Abdominal abscess: Drain in place. Cultures negative. --per  CCS to continue Zosyn /Micafungin for 14 days with EOT 10/21/23 9.  Malnutrition: Po intake poor and TPN weaned off 10/10             --continue Ensure BID, juven 10. 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 Continue Incruse inhaler  - 10/13 discussed with surgery, hold methotrexate  and CellCept  for at least 4 weeks if possible unless urgent need 12.  T2DM: Hgb A1C- 4.6. Monitor BS. Was on Ozempic outpatient -10/21/23 pt states she's been on ozempic and her diabetes was under great control, obviously off of that now-- will add on CBG checks daily in the AM to monitor, if noting rising fasting sugars, might need to check more often. Caution with supplements being carb heavy -10/13-14 controlled this morning CBG (last 3)  Recent Labs    10/22/23 0621 10/23/23 0623  GLUCAP 81 80    13. Obesity: Educate on diet and exercise to help with mobility and overall health 14. H/o Depression: On prozac 40mg  daily and Wellbutrin  450mg  daily.  15. Bowel management: hx of profound chronic constipation, now with colostomy. Continue Colace 100mg  BID, miralax  daily. Monitor ostomy output.  16. ABLA/thrombocytosis: Hgb 8's, monitor routinely. Plt 417 likely reactive.   - 9/13 hemoglobin stable at 8.8/platelets 44K, continue to monitor 17.  Pneumonia  - Start as needed Delsym, patient reports she does not tolerate guaifenesin  - 10/14 start antibiotics for left lower lobe airspace opacity, discussed  with pharmacy Augmentin  for 5 days.  -10/15 symptoms improved, continue current   LOS: 4 days A FACE TO FACE EVALUATION WAS PERFORMED  Murray Collier  10/24/2023, 8:46 AM

## 2023-10-24 NOTE — Progress Notes (Addendum)
 Patient ID: [name], female   DOB: March 28, 1960, 63 y.o.   MRN: [id number]  Have reviewed team conference with pt and family. Both aware and agreeable with targeted d/c date of 10/18 and goals of Independent with assistive device.  3 in 1 commode ordered through [employer] - pending delivery. HH PT/OT/SN to be set up.   1550: Referral for wound vac faxed.

## 2023-10-24 NOTE — Progress Notes (Signed)
 Physical Therapy Session Note  Patient Details  Name: [name] MRN: [id number] Date of Birth: 05/18/1960  Today's Date: 10/24/2023 PT Individual Time: 0815-0858 PT Individual Time Calculation (min): 43 min   Short Term Goals: Week 1:  PT Short Term Goal 1 (Week 1): STG=LTG 2/2 ELOS  Skilled Therapeutic Interventions/Progress Updates:      Pt seated in WC upon arrival. Pt agreeable to therapy. Pt endorses 7/10 abdominal pain at site of wound vac dressing--pt endorses it was changed yesterday and feels tighter than before but getting better. Premedicated.   PT transported dependent in [facility] to ortho gym for time/energy conservation.   Pt ambulated up/down ramp with RW and supervision, verbal cues provided for safety with RW.   Pt performed ambulatory transfer to car simulator at height of [address] with RW and supervision, verbal cues provided for sequencing.   Pt perfomred short distance ambulatory transfer to rehab apartment bed with RW and supervision with ++ time. Verbal cues provided for sequencing.   Pt ambulated rehab apartment to double doors with RW and close supervision, intermittent verbal cues provided for safety with RW.   Discussed DME, unable to trail hurrycane 2/2 availability-PT recommending use of RW with mobility at home. Pt verbalized understanding and agreeable. PT answered pt questions regarding follow up HHPT.   PT managing wound vac throughout session.   Pt seated in WC at end of session with all needs within reach and chair alarm on.       Therapy Documentation Precautions:  Precautions Precautions: Fall Recall of Precautions/Restrictions: Impaired Precaution/Restrictions Comments: Wound vac, abdominal protection precs; LLQ JP drain, ostomy, IV antibiotics Restrictions Weight Bearing Restrictions Per Provider Order: No   Therapy/Group: Individual Therapy  [facility] [place], [place], DPT  10/24/2023, 8:16 AM

## 2023-10-24 NOTE — Progress Notes (Signed)
 Occupational Therapy Session Note  Patient Details  Name: [name] MRN: [id number] Date of Birth: [birth year]  Session 1: Today's Date: 10/24/2023 OT Individual Time: 9269-9194 OT Individual Time Calculation (min): 35 min   Session 2: Today's Date: 10/24/2023 OT Individual Time: 8953-8799 OT Individual Time Calculation (min): 74 min   Short Term Goals: Week 1:  OT Short Term Goal 1 (Week 1): LTG=STG mod I  Session 1: Skilled Therapeutic Interventions/Progress Updates:  Patient agreeable to participate in OT session. Reports 4/10 pain level due to position of wound vac/ stomach pain   Patient participated in skilled OT session focusing on ADL completion. Patient received in bed ready for OT. Completed selection of clothes with SU. Able to complete toileitng with SUP A to CGA. Patient completed UB bathing with SU sitting at sink in wc. Completed LB bathing with CGA and RW with hand on sink. Utilized and educated on [employer] administrator for [employer] and doffing. Patient able to complete with good skill and SUP/ verbal cueing. Patient completed UB dressing mod I. Left in wc in room with all needs in reach.     Session 2: Skilled Therapeutic Interventions/Progress Updates:  Patient agreeable to participate in OT session. Reports no pain level.   Patient participated in skilled OT session focusing on ostomy bag emptying cleaning, functional mobility/ light mobility around room, and education. Patient received in bed reporting fatigue. Patient and OT discussed D/C plan. Patient and OT observed bag being filled, completed emptying with verbal cues and CGA for cleanliness. Completed transfer to wc sup. Completed functional mobility with single hand support on rail. Completed bed mobility and transfer and set up for lunch with SUP A for all mobility. Required mod A for management of wound vac. Left in room all needs in reach.     Therapy Documentation Precautions:  Precautions Precautions:  Fall Recall of Precautions/Restrictions: Impaired Precaution/Restrictions Comments: Wound vac, abdominal protection precs; LLQ JP drain, ostomy, IV antibiotics Restrictions Weight Bearing Restrictions Per Provider Order: No Therapy/Group: Individual Therapy  D'mariea L Meri Pelot 10/24/2023, 7:20 AM

## 2023-10-24 NOTE — Patient Care Conference (Signed)
 Inpatient RehabilitationTeam Conference and Plan of Care Update Date: 10/24/2023   Time: 09:35 AM    Patient Name: [name]      Medical Record Number: [id number]  Date of Birth: 1960/12/19 Sex: Female         Room/Bed: 5T92R/5T92R-98 Payor Info: Payor: HULAN / Plan: [employer] NAP / Product Type: *No Product type* /    Admit Date/Time:  10/20/2023 12:50 PM  Primary Diagnosis:  Debility  Hospital Problems: Principal Problem:   Debility    Expected Discharge Date: Expected Discharge Date: 10/27/23  Team Members Present: Physician leading conference: Dr. Murray Collier Social Worker Present: Waverly Gentry, LCSW-A Nurse Present: Barnie Ronde, RN PT Present: Doreene Orris, PT OT Present: Michaelyn Seip, OT SLP Present: Rosina Downy, SLP PPS Coordinator present : Eleanor Colon, SLP     Current Status/Progress Goal Weekly Team Focus  Bowel/Bladder   Patient has urge incontinence. Colostomy for bowel management.   Patient will be continent of urine. Patient will be able to manage colostomy independently.   Provide prompted toileting to prevent urge incontinence. Provide education on ostomy and encourage patient to participate in ostomy care.    Swallow/Nutrition/ Hydration               ADL's   min A to CG with ADLs, CG for balance   mod I   barrier: self limiting, anxiety. patient progressing towards independence. hesistant for ostomy bag care. working on activity tolerance, balance, self care.    Mobility   Bed mobility supervision/verbal cueing; sit to stands CGA; ambulation with RW CGA/close supervision   mod I/supervision  DC 10/18 ; F/U HHPT; barriers to DC - anxiety, self-limiting behaviors, wound vac and ostomy bag; DME pt has hurry cane and bariatric RW; family ed TBD/pt stated her sister probably wouldn't be able to come back up here this week    Communication                Safety/Cognition/ Behavioral Observations               Pain    Patient reports severe pain to ABD around wound. Worse with movement. PRN oxycodone  and scheduled tylenol  ordered.   Patient will report a pain score of less than 4/10   Assess pain every shift and as needed. Provide pain intervention when necessary. Ensure clothing is not tight around ABD wound and prevent pulling on wound vac and JP drain lines.    Skin   Midline incision to abdomen with wound vac in place. Wound vac changed 10/14. Colostomy and LLQ drain.   Continued wound healing of abdominal incision. Patient would like drain removed soon. Prevention of further skin breakdown.  Assess skin every shift and as needed. Provide wound care and ensure wound vac is working appropriately.      Discharge Planning:  Patient plans to discharge back home with her husband, [name]. She plans to continue assisting her sister [name] with caring for their demented mother 24/7. Wound vac will be removed prior to discharge. HH PT/OT/SN to be set up. 3-1 commode ordered through [employer].   Team Discussion: Patient admitted with debility post sigmoid colitis/septic shock. Required exploratory lap with Hartman's colostomy. Currently with a negative pressure wound vac, ostomy and JP drain.  Functional progress impaired by anxiety and self limiting behaviors.  Patient on target to meet rehab goals: yes, currently needs CGA - close supervision with verbal cues for care. Goals for discharge set for mod  I overall.  *See Care Plan and progress notes for long and short-term goals.   Revisions to Treatment Plan:  Wound care for abd incision Abx for pna   Teaching Needs: Safety, medications, skin care/wound care, ostomy care/management, transfers, toileting, etc.   Current Barriers to Discharge: Home enviroment access/layout, Wound care, Lack of/limited family support, and new ostomy  Possible Resolutions to Barriers: Family education HH follow up services Enrolled in [place] Secure [employer] Program DME:  BSC/3N1     Medical Summary Current Status: debility, anxiety, ostomy, wound, hypotension, DM2, pneumonia, abla  Barriers to Discharge: Complicated Wound;Hypotension;Infection/IV Antibiotics;Medical stability;Self-care education  Barriers to Discharge Comments: debility, anxiety, ostomy, wound, hypotension, DM2, pneumonia, abla Possible Resolutions to [employer] Focus: IR for drain removal, recheck labs, antibiotics, monitor BP, wound care   Continued Need for Acute Rehabilitation Level of Care: The patient requires daily medical management by a physician with specialized training in physical medicine and rehabilitation for the following reasons: Direction of a multidisciplinary physical rehabilitation program to maximize functional independence : Yes Medical management of patient stability for increased activity during participation in an intensive rehabilitation regime.: Yes Analysis of laboratory values and/or radiology reports with any subsequent need for medication adjustment and/or medical intervention. : Yes   I attest that I was present, lead the team conference, and concur with the assessment and plan of the team.   Fredericka Sober B 10/24/2023, 1:42 PM

## 2023-10-24 NOTE — Progress Notes (Signed)
 Physical Therapy Session Note  Patient Details  Name: [name] MRN: [id number] Date of Birth: 1960/06/23  Today's Date: 10/24/2023 PT Individual Time: 0900-0930, 1435-1530 PT Individual Time Calculation (min): 30 min, 55 min   Short Term Goals: Week 1:  PT Short Term Goal 1 (Week 1): STG=LTG 2/2 ELOS  Skilled Therapeutic Interventions/Progress Updates:      Treatment 1:  Pt seated in WC upon arrival. Pt reports 7/10 pain but states she does not want any pain medication and agreeable to therapy.  Pt transported dependent in [facility] to day room for time/energy conservation. Pt worked on endurance during dynamic standing balance and ambulation as she ambulated around room using RW to reach and pick up cones from surfaces of various levels. Pt required CGA progressing to close supervision and assist to manage wound vac.  Pt requested to use BSC upon return to room. Pt performed short distance ambulatory transfer to [facility] using RW with close supervision and verbal cueing for sequencing of task. Pt able to perform toilet hygiene independently and manage clothing in standing position with CGA/close supervision. Pt then returned to EOB using RW with close supervision followed by returning to supine with mod I. Pt comfortable, bed alarm on, and all needs in reach upon PT exiting room.   Treatment 2:  Pt supine in bed upon arrival, urgently requesting to use BSC. Pt reported 10/10 pain but agreeable to therapy. Nursing notified of pain and present to administer medication. Rest breaks and repositioning provided as well.  Pt performed stand pivot transfer EOB to [facility] using RW with CGA and verbal cueing for sequencing, RW placement, and reaching back for [facility]. Pt stated she was in a hurry due to urgency which is why she was rushing and skipped steps. Educated patient and emphasized importance of taking the time to perform transfers using proper steps in order to improve safety and reduce fall risk.  Patient verbalized understanding.  Pt transported dependent in [facility] to day room for time/energy conservation. Pt worked on endurance with UE/LE strengthening on Nustep x11 min (level 3).  Pt performed the following seated B LE strengthening exercises EOB to establish HEP: LAQ 1x10; seated marching (2x10); seated heel raises (2x10); seated toe raises (2x10). Patient demonstrated and verbalized understanding. Provided pt with illustrated handout for HEP.  At end of session, pt supine in bed, comfortable, bed alarm on, and all needs within reach upon PT exiting room.  Therapy Documentation Precautions:  Precautions Precautions: Fall Recall of Precautions/Restrictions: Impaired Precaution/Restrictions Comments: Wound vac, abdominal protection precs; LLQ JP drain, ostomy, IV antibiotics Restrictions Weight Bearing Restrictions Per Provider Order: No  Therapy/Group: Individual Therapy  [name], DPT 10/24/2023, 4:42 PM

## 2023-10-24 NOTE — Plan of Care (Signed)
  Problem: Consults Goal: RH GENERAL PATIENT EDUCATION Description: See Patient Education module for education specifics. Outcome: Progressing Goal: Skin Care Protocol Initiated - if Braden Score 18 or less Description: If consults are not indicated, leave blank or document N/A Outcome: Progressing Goal: Nutrition Consult-if indicated Outcome: Progressing   Problem: RH BOWEL ELIMINATION Goal: RH STG MANAGE BOWEL WITH ASSISTANCE Description: STG Manage Bowel with mod I Assistance. Outcome: Progressing   Problem: RH SKIN INTEGRITY Goal: RH STG SKIN FREE OF INFECTION/BREAKDOWN Description: Manage w min assist Outcome: Progressing   Problem: RH SAFETY Goal: RH STG ADHERE TO SAFETY PRECAUTIONS W/ASSISTANCE/DEVICE Description: STG Adhere to Safety Precautions With cues Assistance/Device. Outcome: Progressing   Problem: RH PAIN MANAGEMENT Goal: RH STG PAIN MANAGED AT OR BELOW PT'S PAIN GOAL Description: Pain < 4 with prns Outcome: Progressing   Problem: RH KNOWLEDGE DEFICIT GENERAL Goal: RH STG INCREASE KNOWLEDGE OF SELF CARE AFTER HOSPITALIZATION Description: Manage care using educational resources for medications and dietary modification independently Outcome: Progressing   Problem: Education: Goal: Knowledge of ostomy care will improve Outcome: Progressing Goal: Understanding of discharge needs will improve Outcome: Progressing   Problem: Bowel/Gastric/Urinary: Goal: Gastrointestinal status for postoperative course will improve Outcome: Progressing   Problem: Coping: Goal: Coping ability will improve Outcome: Progressing   Problem: Health Behavior/Discharge Planning: Goal: Ability to manage health-related needs will improve Outcome: Progressing   Problem: Nutrition: Goal: Will attain and maintain optimal nutritional status will improve Outcome: Progressing

## 2023-10-24 NOTE — Consult Note (Addendum)
 Patient has been followed by [facility] team for negative pressure wound therapy management, last seen 10/23/2023.  New consult placed: please transition to wet to dry dressings on 10/16. Educate patient on care.  WOC team will follow 10/25/2023.    Thank you,    Powell Bar MSN, RN-BC, [employer]

## 2023-10-24 NOTE — Discharge Instructions (Addendum)
 Inpatient Rehab Discharge Instructions  [name] Discharge date and time:  10/28/23  Activities/Precautions/ Functional Status: Activity: no lifting, driving, or strenuous exercise till cleared by MD Diet: regular diet--soft foods Wound Care:    Functional status:  ___ No restrictions     ___ Walk up steps independently ___ 24/7 supervision/assistance   ___ Walk up steps with assistance _X__ Intermittent supervision/assistance  ___ Bathe/dress independently _X__ Walk with walker     _X__ Bathe/dress with assistance ___ Walk Independently    ___ Shower independently ___ Walk with assistance    ___ Shower with assistance _X__ No alcohol     ___ Return to work/school ________   Special Instructions:  COMMUNITY REFERRALS UPON DISCHARGE:    Home Health:   PT     OT     RN                    Agency: [facility] Phone: [phone]  Will follow up with patient on Monday   Medical Equipment/Items Ordered: 3 in 1 commode                                                  Agency/Supplier: [employer] [facility]    My questions have been answered and I understand these instructions. I will adhere to these goals and the provided educational materials after my discharge from the hospital.  Patient/Caregiver Signature _______________________________ Date __________  Clinician Signature _______________________________________ Date __________  Please bring this form and your medication list with you to all your follow-up doctor's appointments.

## 2023-10-25 ENCOUNTER — Inpatient Hospital Stay (HOSPITAL_COMMUNITY)

## 2023-10-25 LAB — CBC
HCT: 29.1 % — ABNORMAL LOW (ref 36.0–46.0)
Hemoglobin: 9 g/dL — ABNORMAL LOW (ref 12.0–15.0)
MCH: 28.1 pg (ref 26.0–34.0)
MCHC: 30.9 g/dL (ref 30.0–36.0)
MCV: 90.9 fL (ref 80.0–100.0)
Platelets: 393 K/uL (ref 150–400)
RBC: 3.2 MIL/uL — ABNORMAL LOW (ref 3.87–5.11)
RDW: 15.1 % (ref 11.5–15.5)
WBC: 6.8 K/uL (ref 4.0–10.5)
nRBC: 0 % (ref 0.0–0.2)

## 2023-10-25 LAB — BASIC METABOLIC PANEL WITH GFR
Anion gap: 9 (ref 5–15)
BUN: 12 mg/dL (ref 8–23)
CO2: 25 mmol/L (ref 22–32)
Calcium: 8.7 mg/dL — ABNORMAL LOW (ref 8.9–10.3)
Chloride: 105 mmol/L (ref 98–111)
Creatinine, Ser: 0.89 mg/dL (ref 0.44–1.00)
GFR, Estimated: >60 mL/min (ref >60)
Glucose, Bld: 90 mg/dL (ref 70–99)
Potassium: 3.9 mmol/L (ref 3.5–5.1)
Sodium: 139 mmol/L (ref 135–145)

## 2023-10-25 LAB — GLUCOSE, CAPILLARY: Glucose-Capillary: 88 mg/dL (ref 70–99)

## 2023-10-25 MED ORDER — METHOCARBAMOL 500 MG PO TABS
1000.0000 mg | ORAL_TABLET | Freq: Two times a day (BID) | ORAL | Status: DC
  Administered 2023-10-26 – 2023-10-27 (×4): 1000 mg via ORAL
  Filled 2023-10-25 (×5): qty 2

## 2023-10-25 MED ORDER — IOHEXOL 350 MG/ML SOLN
75.0000 mL | Freq: Once | INTRAVENOUS | Status: AC | PRN
  Administered 2023-10-25: 75 mL via INTRAVENOUS

## 2023-10-25 NOTE — Plan of Care (Signed)
  Problem: Consults Goal: RH GENERAL PATIENT EDUCATION Description: See Patient Education module for education specifics. Outcome: Progressing Goal: Skin Care Protocol Initiated - if Braden Score 18 or less Description: If consults are not indicated, leave blank or document N/A Outcome: Progressing Goal: Nutrition Consult-if indicated Outcome: Progressing   Problem: RH BOWEL ELIMINATION Goal: RH STG MANAGE BOWEL WITH ASSISTANCE Description: STG Manage Bowel with mod I Assistance. Outcome: Progressing   Problem: RH SKIN INTEGRITY Goal: RH STG SKIN FREE OF INFECTION/BREAKDOWN Description: Manage w min assist Outcome: Progressing   Problem: RH SAFETY Goal: RH STG ADHERE TO SAFETY PRECAUTIONS W/ASSISTANCE/DEVICE Description: STG Adhere to Safety Precautions With cues Assistance/Device. Outcome: Progressing   Problem: RH PAIN MANAGEMENT Goal: RH STG PAIN MANAGED AT OR BELOW PT'S PAIN GOAL Description: Pain < 4 with prns Outcome: Progressing   Problem: RH KNOWLEDGE DEFICIT GENERAL Goal: RH STG INCREASE KNOWLEDGE OF SELF CARE AFTER HOSPITALIZATION Description: Manage care using educational resources for medications and dietary modification independently Outcome: Progressing   Problem: Education: Goal: Knowledge of ostomy care will improve Outcome: Progressing Goal: Understanding of discharge needs will improve Outcome: Progressing   Problem: Bowel/Gastric/Urinary: Goal: Gastrointestinal status for postoperative course will improve Outcome: Progressing   Problem: Coping: Goal: Coping ability will improve Outcome: Progressing   Problem: Health Behavior/Discharge Planning: Goal: Ability to manage health-related needs will improve Outcome: Progressing   Problem: Nutrition: Goal: Will attain and maintain optimal nutritional status will improve Outcome: Progressing

## 2023-10-25 NOTE — Consult Note (Signed)
 WOC Nurse wound follow up Photo in chart Wound type:midline abdominal  Measurement: 14 cm x 5 cm x 2.3 cm  Wound bed:95% beefy red  5% adipose  Drainage (amount, consistency, odor) moderate serosanguinous in canister  no odor Periwound:medical adhesive related skin injury at distal and proximal end from drape  (12 and 6 o'clock) .  Stoma in close proximity to midline wound.   Dressing procedure/placement/frequency: Cleansed wound with NS and pat dry.  (Demonstrated a NS moist gauze dressing in the event the VAC malfunctions at home)  Placed stoma powder and skin prep on peristomal breakdown.  Pieces of barrier ring to umbilicus, proximal and distal end to protect and promote seal.  1 piece black foam.  COvered with drape and applied TRAC pad.  Seal immediately achieved at 125 mmHg,  Will likely be changed twice weekly with HH as well. Next dressing change will be Monday with [facility] agency if discharged as planned.  VAC device has been approved and ordered. On day of discharge, can be connected to home unit.   WOC Nurse ostomy follow up Photo in chart Stoma type/location: LLQ, less oval today Stomal assessment/size: 30 mm, but rounded slightly.  New pattern created.  I created pattern so barrier is cut off center to allow space between [facility] dressing and ostomy pouch.  Peristomal assessment: stomal separation from 11 o'clock to 1 o'clock, minimal.  See photo  Stoma is more round today.  Stoma powder and skin prep to separation and peristomal skin.   Treatment options for stomal/peristomal skin: stoma powder skin prep and barrier ring 1 piece flexible convex pouch  ostomy belt Output soft brown stool Ostomy pouching: 1pc. Convex with barrier ring and ostomy belt.  Education provided: discussed oval to more round stoma, stomal separation and treatment.  Patient able to mold and apply barrier ring.  She cut the barrier with the pattern I drew.  She applied pouch with minimal assistance.  She is able to secure  the belt herself.  We discussed twice weekly pouch changes, emptying when 1/3 full and obtaining supplies after discharge.  Once cleared to shower, she can shower with or without pouch on.  I provide information on the outpatient ostomy clinic and provide the phone number.  She is interested in being seen there after discharge.  Her plan is to discharge home with her sister and mother.  Her mother requires full time care.  We discuss risk of hernia and no heavy lifting, pushing or pulling.   Enrolled patient in [place] Secure [employer] Discharge program: Yes  [place] has been in contact with her.  *She is anxious but ready to discharge home.  We will ensure she has 4-5 pouch sets and rings to take home.  I feel she can independently manage ostomy care with some practice. She understands this is best done in front of a mirror.  She will practice with [facility] nurse.  Will not follow at this time.  Please re-consult if needed.  Darice Cooley MSN, RN, FNP-BC CWON Wound, Ostomy, Continence Nurse Outpatient [facility] [phone] Work cell phone:  [phone]

## 2023-10-25 NOTE — Progress Notes (Signed)
 Physical Therapy Session Note  Patient Details  Name: LIBORIA PUTNAM MRN: [id number] Date of Birth: 17-Jul-1960  Today's Date: 10/25/2023 PT Individual Time: 9069-8986, 1102-1200 PT Individual Time Calculation (min): 43 min, 58 min  Short Term Goals: Week 1:  PT Short Term Goal 1 (Week 1): STG=LTG 2/2 ELOS  Skilled Therapeutic Interventions/Progress Updates:      Treatment 1:  Pt supine in bed upon arrival. Pt states her pain is ok and agreeable to therapy. Rest breaks and repositioning provided. Pt with heightened anxiety this AM, stating she had a bad night and is very tired.  Session focused on functional strength and endurance with ambulation and transfers. Pt sat to EOB with supervision. Pt transferred to [facility] using RW with supervision. Good recall and demonstration of sequencing of task in safe manner.  Pt transported dependent in [facility] to day room for time/energy conservation. After rest break, pt ambulated ~150 ft back to her room with supervision.  Upon return to room, pt requested to use BSC. Pt performed stand pivot transfer EOB <> BSC using RW with supervision. Pt performed toilet hygiene and clothing management with supervision. Pt returned to supine in bed with supervision.  Pt supine in bed, bed alarm on, and all needs within reach upon PT exiting room.  Treatment 2:  Pt supine in bed, sleeping upon arrival. After waking from nap, patient states her pain level is manageable and agreeable to therapy. Rest breaks and repositioning provided. Pt also complains of being cold. Extra blanket provided during session. Pt remains highly anxious throughout session.  Sitting EOB, pt emptied ostomy bag with supervision with education and encouragement from PT. Pt demonstrated good recall of technique and sequencing.  Pt ambulated 196 ft from room to main gym using RW with supervision.   Pt navigated 2-4 inch hurdles x 4 trials using RW with CGA/close supervision. Verbal cues  and  demonstration provided for safety with RW, and technique/sequencing. Pt very fatigued after this activity.  After returning to room, pt performed short ambulatory transfer WC to EOB using RW with supervision. Pt returned to supine with supervision.  Nursing present at end of session to administer medications.  Pt supine in bed, bed alarm on, and all needs within reach upon PT exiting room.  Therapy Documentation Precautions:  Precautions Precautions: Fall Recall of Precautions/Restrictions: Impaired Precaution/Restrictions Comments: Wound vac, abdominal protection precs; LLQ JP drain, ostomy, IV antibiotics Restrictions Weight Bearing Restrictions Per Provider Order: No  Therapy/Group: Individual Therapy  [name], DPT 10/25/2023, 12:37 PM

## 2023-10-25 NOTE — Progress Notes (Addendum)
 Child's memory                                                        PROGRESS NOTE   Subjective/Complaints: Patient reports she is doing well overall.  Discussed plan to continue wound VAC.  She did have some nausea yesterday that improved.  ROS: as per HPI. Denies CP, SOB, abd pain, V/D/C, or any other complaints at this time.   +cough- improving +nausea-improved  Objective:   No results found.  Recent Labs    10/25/23 0545  WBC 6.8  HGB 9.0*  HCT 29.1*  PLT 393   Recent Labs    10/25/23 0545  NA 139  K 3.9  CL 105  CO2 25  GLUCOSE 90  BUN 12  CREATININE 0.89  CALCIUM  8.7*        Intake/Output Summary (Last 24 hours) at 10/25/2023 1311 Last data filed at 10/25/2023 0550 Gross per 24 hour  Intake 485 ml  Output 95 ml  Net 390 ml        Physical Exam: Vital Signs Blood pressure (!) 102/55, pulse 76, temperature 98.4 F (36.9 C), temperature source Oral, resp. rate 16, height 5' 8.5 (1.74 m), weight 121.6 kg, SpO2 95%.  General: sitting in WC, working with therapy, she is very pleasant Heart: Regular rate and rhythm no rubs murmurs or extra sounds Lungs: Clear to auscultation, breathing unlabored, no rales or wheezes Abdomen: LLQ colostomy moderate amount of brown liquid stool in bag, abd drain in LLQ with wound vac with good seal.  Positive bowel sounds, soft, some tenderness to palpation around wounds, nondistended + Percutaneous drain with minimal fluid in the bulb- unchanged Extremities: No clubbing, cyanosis, or edema Skin: No evidence of breakdown, no evidence of rash over exposed surfaces aside from aforementioned wounds.   Neurologic: Cranial nerves II through XII intact, motor strength is 4/5 in bilateral deltoid, bicep, tricep, grip, 4- / 5hip flexor, knee extensors, ankle dorsiflexor and plantar flexor Sensory exam normal sensation to light touch and proprioception in bilateral upper and lower extremities pt states she has neuropathy and her  feet feel numb  Prior neuro assessment is c/w today's exam 10/25/2023.    Musculoskeletal: Full range of motion in all 4 extremities. No joint swelling  PICC line right upper extremity  Assessment/Plan: 1. Functional deficits which require 3+ hours per day of interdisciplinary therapy in a comprehensive inpatient rehab setting. Physiatrist is providing close team supervision and 24 hour management of active medical problems listed below. Physiatrist and rehab team continue to assess barriers to discharge/monitor patient progress toward functional and medical goals  Care Tool:  Bathing    Body parts bathed by patient: Right arm, Left arm, Abdomen, Chest, Front perineal area, Buttocks, Right upper leg, Left upper leg, Face   Body parts bathed by helper: Right lower leg, Left lower leg     Bathing assist Assist Level: Minimal Assistance - Patient > 75%     Upper Body Dressing/Undressing Upper body dressing   What is the patient wearing?: Pull over shirt    Upper body assist Assist Level: Supervision/Verbal cueing    Lower Body Dressing/Undressing Lower body dressing      What is the patient wearing?: Pants, Underwear/pull up (pad)     Lower body assist Assist for lower  body dressing: Minimal Assistance - Patient > 75%     Toileting Toileting    Toileting assist Assist for toileting: Minimal Assistance - Patient > 75%     Transfers Chair/bed transfer  Transfers assist     Chair/bed transfer assist level: Minimal Assistance - Patient > 75%     Locomotion Ambulation   Ambulation assist      Assist level: Contact Guard/Touching assist Assistive device: Walker-rolling Max distance: 121   Walk 10 feet activity   Assist     Assist level: Contact Guard/Touching assist Assistive device: Walker-rolling   Walk 50 feet activity   Assist    Assist level: Contact Guard/Touching assist Assistive device: Walker-rolling    Walk 150 feet  activity   Assist Walk 150 feet activity did not occur: Safety/medical concerns (fatigue/SOB)         Walk 10 feet on uneven surface  activity   Assist Walk 10 feet on uneven surfaces activity did not occur: Safety/medical concerns (fatigue)         Wheelchair     Assist Is the patient using a wheelchair?: Yes Type of Wheelchair: Manual    Wheelchair assist level: Supervision/Verbal cueing Max wheelchair distance: 50    Wheelchair 50 feet with 2 turns activity    Assist        Assist Level: Supervision/Verbal cueing   Wheelchair 150 feet activity     Assist      Assist Level: Maximal Assistance - Patient 25 - 49%   Blood pressure (!) 102/55, pulse 76, temperature 98.4 F (36.9 C), temperature source Oral, resp. rate 16, height 5' 8.5 (1.74 m), weight 121.6 kg, SpO2 95%.  Medical Problem List and Plan: 1. 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 Advised pt on team conference and evals, how d/c planning works.  -Expected DC 10/27/23  2.  Antithrombotics: -DVT/anticoagulation:  Pharmaceutical: Lovenox  40mg  daily             -antiplatelet therapy: N/A 3. Pain Management: Still using IV fentanyl  only for wound vac changes --continue tylenol  650mg  qid scheduled+PRN, Robaxin 1000 mg QID and oxycodone  prn, lidoderm  patches. Also on lyrica 50mg  BID -10/15 patient reports pain is overall controlled with oxycodone , she tries not to use this very frequently.  Continue current regimen 4. Mood/Behavior/Sleep: LCSW to follow for evaluation and support             --Trazodone prn   -takes Xanax  at home (states it's 0.5-1mg  TID PRN but always takes 1mg )-- also uses for sleep.  -10/21/23 updated xanax  indication for sleep if trazodone ineffective, discussed trying trazodone for sleep. Did NOT increase xanax  to avoid oversedation, and discussed that with pt.  -10/22/23 appears he slept better with as needed trazodone, continue current regimen and monitor -10/14 continue trazodone appears to be helping for sleep             -antipsychotic agents: N/A 5. Neuropsych/cognition: This patient is capable of making decisions on her  own  behalf. 6. Skin/Wound Care/ostomy LLQ: Wound VAC change twice a week by WOC             --continue to encourage intake.  -10/21/23 very anxious about ostomy education-- might benefit to have WOC come an extra day per week to see her specifically about ostomy education.  -10/13 surgery PA Almarie Pringle okay with removal of drain per IR -10/14 called IR regarding drain removal -10/15 WOC possible change to wet to dry if wound becomes more shallow -10/16 discussed with WOC, will continue outpatient wound VAC.  Social work reports this is ordered. Call IR  today 7. Fluids/Electrolytes/Nutrition: monitor I&O and labs, continue vitamins and supplements              --Continue Ensure BID. Add Juven BID -10/21/23 Mg being checked daily, but has been WNL, 2.0 today. Will d/c daily checks, can check as needed.  8.  Abdominal abscess: Drain in place. Cultures negative. --per  CCS to continue Zosyn /Micafungin for 14 days with EOT 10/21/23 9.  Malnutrition: Po intake poor and TPN weaned off 10/10             --continue Ensure BID, juven 10. Hypotension: Continue midodrine 10 mg TID  -10/13-16 BP overall stable continue to monitor Vitals:   10/21/23 1452 10/21/23 2130 10/22/23 0613 10/22/23 1304  BP: (!) 116/58 (!) 142/62 (!) 114/55 (!) 106/42   10/22/23 2006 10/23/23 0401 10/23/23 1540 10/23/23 1922  BP: (!) 134/52 (!) 117/56 128/67 134/75   10/24/23  0442 10/24/23 1332 10/24/23 2121 10/25/23 0550  BP: (!) 104/52 113/62 134/64 (!) 102/55    11.  ANCA associated vasculitis/ILD:  Methotrexate  and Cellcept  on hold. Continue Incruse inhaler  - 10/13 discussed with surgery, hold methotrexate  and CellCept  for at least 4 weeks if possible unless urgent need 12.  T2DM: Hgb A1C- 4.6. Monitor BS. Was on Ozempic outpatient -10/21/23 pt states she's been on ozempic and her diabetes was under great control, obviously off of that now-- will add on CBG checks daily in the AM to monitor, if noting rising fasting sugars, might need to check more often. Caution with supplements being carb heavy -10/16 AM Cbg stable CBG (last 3)  Recent Labs    10/23/23 0623 10/25/23 0604  GLUCAP 80 88    13. Obesity: Educate on diet and exercise to help with mobility and overall health 14. H/o Depression: On prozac 40mg  daily and Wellbutrin  450mg  daily.  15. Bowel management: hx of profound chronic constipation, now with colostomy. Continue Colace 100mg  BID, miralax  daily. Monitor ostomy output.  16. ABLA/thrombocytosis: Hgb 8's, monitor routinely. Plt 417 likely reactive.   - 9/13 hemoglobin stable at 8.8/platelets 484K, continue to monitor  - 9/16 hemoglobin stable at 9, platelets improved to 3 93K 17.  Pneumonia  - Start as needed Delsym, patient reports she does not tolerate guaifenesin  - 10/14 start antibiotics for left lower lobe airspace opacity, discussed with pharmacy Augmentin  for 5 days.  -10/15-16 symptoms improved, continue current   LOS: 5 days A FACE TO FACE EVALUATION WAS PERFORMED  Murray Collier 10/25/2023, 1:11 PM

## 2023-10-25 NOTE — Progress Notes (Signed)
 Received call from radiology regarding high grade SBO noted on follow up CT A/P.  Contacted nurse for update on patient who had eaten evening meal without difficulty.  Paged surgery and discussed CT results with Dr, Stevie who recommended monitoring for now and no need to change diet as patient without symptoms.  Surgery to follow up in am.   Called nurse back to get complete update. She reported that patient has nausea but has been getting scheduled zofran  for this as well  abdominal pain managed with prn oxycodone  which she has been taking about twice a day on average. She was asleep and resting, had some stool in ostomy but not too much output in past couple of days.but when awakened by nurse reported having 10+ level of pain thorough out the day. No signs of distress per nursing and vitals stable.  Will go ahead and downgrade diet to liquids and advised nurse to encourage patient to use miralax . Surgery updated and no new changes recommended.

## 2023-10-25 NOTE — Progress Notes (Signed)
 Occupational Therapy Session Note  Patient Details  Name: [name] MRN: [id number] Date of Birth: 06/21/1960  Session 1 Today's Date: 10/25/2023 OT Individual Time: 0800-0900 OT Individual Time Calculation (min): 60 min  Session 2 Today's Date: 10/25/2023 OT Individual Time: 8699-8654 OT Individual Time Calculation (min): 45 min    Short Term Goals: Week 1:  OT Short Term Goal 1 (Week 1): LTG=STG mod I  Skilled Therapeutic Interventions/Progress Updates:  Session 1: Skilled OT session completed to address ADL retraining. Pt received supine in bed soiled of urine, agreeable to participate in therapy. Pt reports no pain.  Pt requested to void, bed>BSC CGA, pt manages toileting hygiene in standing without AE. OT prompted pt to empty ostomy prior to bathing, pt declined at this time. Pt completed ADL session at sink seated in [facility] with CGA standing intermittently throughout task. In standing, pt's ostomy leaks during task, pt experiencing extreme distress. OT provided increased redirection to task, VC to seal ostomy bag to manage leak and continue with ADL session. Pt and OT collaborate with problem solving ostomy care in the home and prioritizing sealing ostomy instead of perseverating on leak, pt verbalized understanding. OT to f/u with wound nurse at PM session with ostomy care. UB dressing completed with [place], pt retrieves clothing at bedside ambulating with RW. LB dressing completed with reacher to weave BLE in pants. Pt seated in [facility] with bedside table and call bell in reach direct handoff to RN.  Session 2: Skilled OT session completed to address discharge planning and ostomy care. Pt received supine in bed, agreeable to participate in therapy. Pt reports no pain.  Pt requested A with managing discharge equipment, OT provided education of use and set up on [facility] within the home. Pt verbalized understanding and requests to call [employer] to ensure delivery of equipment to CIR. OT  directs pt to notebook with phone numbers listed and uses cell phone with independence to call. Pt growing increasingly anxious and frustrated during call d/t limited understanding of equipment, OT redirects and provides support during phone call A with equipment name and purpose of call. Once call completed, pt expressing less anxiety about discharge. Pt reports concerns with ostomy care and wound vac, OT cues pt to recall principles of ostomy care from previous OT session. Pt able to recall emptying routine, ostomy belt, and binding ring. OT provides education on cleaning around stoma with warm wash cloth, wound RN also suggests mild soap. Pt verbalized understanding. OT provided education on wound vac mgmt in home and wearable bag to A with mgmt in community. Pt supine in bed with bed alarm on and direct hand off to wound RN with all needs met.  Therapy Documentation Precautions:  Precautions Precautions: Fall Recall of Precautions/Restrictions: Impaired Precaution/Restrictions Comments: Wound vac, abdominal protection precs; LLQ JP drain, ostomy, IV antibiotics Restrictions Weight Bearing Restrictions Per Provider Order: No    Therapy/Group: Individual Therapy  Tjay Velazquez Woods-Chance, MS, OTR/L 10/25/2023, 12:17 PM

## 2023-10-25 NOTE — Progress Notes (Signed)
 Referring Provider(s): Ms. Almarie Pringle, PA-C  Supervising Physician: Jennefer Rover  Patient Status:  [facility] - In-pt  Chief Complaint: Intra abdominal abscess; s/p LLQ abdominal drain placement 10/16/23   Brief History:  Patient admitted with septic shock s/p colon resection/ Hartman's colostomy with abdominal wound vac placement c/b intra abdominal abscess s/p drain placement on 10/7. Patient was discharged on 10/11 to [facility] due to debility.   Subjective:  Patient alert and laying in bed, calm.  Currently without any significant complaints. She is not overtly bothered by her drain, and states it has low output for several day. Per patient report, the fluid in the flub is mainly from flushing, an she would like to have the drain removed prior to discharge from rehab. Patient denies any fevers, headache, chest pain, SOB, cough, nausea, vomiting or bleeding.    Allergies: Mucinex [guaifenesin er], Sulfa antibiotics, and Glucophage [metformin]  Medications: Prior to Admission medications   Medication Sig Start Date End Date Taking? Authorizing Provider  ALPRAZolam  (XANAX ) 1 MG tablet Take 1 tablet (1 mg total) by mouth 3 (three) times daily as needed for anxiety. Patient taking differently: Take 0.5-1.5 mg by mouth See admin instructions. Take 1-1.5 tablets (1-1.5mg ) by mouth every night at bedtime and take 0.5-1 tablets (0.5-1mg ) twice daily as needed for anxiety. 06/30/19   Odell Celinda Balo, MD  ascorbic acid (VITAMIN C) 250 MG tablet Take 1 tablet (250 mg total) by mouth 2 (two) times daily. 10/20/23   Amin, Ankit C, MD  buPROPion  (WELLBUTRIN  XL) 150 MG 24 hr tablet Take 450 mg by mouth daily.    [provider]  carvedilol  (COREG ) 12.5 MG tablet Take 1 tablet (12.5 mg total) by mouth 2 (two) times daily. 07/10/23 11/17/23  Wyn Jackee VEAR Mickey., NP  docusate (COLACE) 50 MG/5ML liquid Take 10 mLs (100 mg total) by mouth 2 (two) times daily as needed for mild  constipation. 10/20/23   Amin, Ankit C, MD  ergocalciferol (VITAMIN D2) 1.25 MG (50000 UT) capsule Take 50,000 Units by mouth every Sunday.    [provider]  feeding supplement (ENSURE PLUS HIGH PROTEIN) LIQD Take 237 mLs by mouth 2 (two) times daily between meals. 10/20/23   Amin, Ankit C, MD  FLUoxetine (PROZAC) 40 MG capsule Take 40 mg by mouth daily.    [provider]  folic acid  (FOLVITE ) 1 MG tablet Take 1 mg by mouth daily.     [provider]  lactose free nutrition (BOOST PLUS) LIQD Take 237 mLs by mouth 2 (two) times daily between meals. 10/20/23   Amin, Ankit C, MD  methocarbamol 1000 MG TABS Take 1,000 mg by mouth 4 (four) times daily. 10/20/23   Caleen Burgess BROCKS, MD  methotrexate  50 MG/2ML injection Inject 15 mg into the vein every Sunday.    [provider]  micafungin in sodium chloride  0.9 % 100 mL Cont inpatient dose. 10/20/23   Amin, Ankit C, MD  midodrine (PROAMATINE) 10 MG tablet Take 1 tablet (10 mg total) by mouth 3 (three) times daily with meals. 10/20/23   Amin, Ankit C, MD  Multiple Vitamin (MULTIVITAMIN WITH MINERALS) TABS tablet Take 1 tablet by mouth daily. 10/20/23   Amin, Ankit C, MD  mycophenolate  (CELLCEPT ) 500 MG tablet Take 3 tablets (1,500 mg total) by mouth 2 (two) times daily. 07/14/19   Odell Celinda Balo, MD  nitroGLYCERIN  (NITROSTAT ) 0.4 MG SL tablet Place 1 tablet (0.4 mg total) under the tongue every  5 (five) minutes as needed for chest pain. 09/22/21 11/17/23  Wyn Jackee VEAR Mickey., NP  piperacillin -tazobactam (ZOSYN ) 3.375 GM/50ML IVPB Inject 50 mLs (3.375 g total) into the vein every 8 (eight) hours. 10/20/23   Amin, Ankit C, MD  polyethylene glycol (MIRALAX  / GLYCOLAX ) packet Take 17 g by mouth 2 (two) times daily as needed (constiaption).    [provider]  pregabalin (LYRICA) 100 MG capsule Take 100 mg by mouth 2 (two) times daily. Patient not taking: Reported on 10/09/2023    [provider]   sacubitril -valsartan  (ENTRESTO ) 49-51 MG Take 1 tablet by mouth 2 (two) times daily. 08/23/23   Nishan, Peter C, MD  Semaglutide, 2 MG/DOSE, (OZEMPIC, 2 MG/DOSE,) 8 MG/3ML SOPN Inject 2 mg into the skin every Sunday.    [provider]  Tiotropium Bromide Monohydrate  (SPIRIVA  RESPIMAT) 1.25 MCG/ACT AERS Inhale 2 puffs into the lungs daily. Patient taking differently: Inhale 2 puffs into the lungs daily as needed (wheezing shortness of breath). 03/12/23   Geronimo Amel, MD  vitamin A 3 MG (10000 UNITS) capsule Take 1 capsule (10,000 Units total) by mouth daily. 10/20/23   Caleen Burgess BROCKS, MD     Vital Signs: BP 122/62 (BP Location: Right Arm)   Pulse 72   Temp 98.7 F (37.1 C) (Oral)   Resp 16   Ht 5' 8.5 (1.74 m)   Wt 268 lb 1.3 oz (121.6 kg)   SpO2 98%   BMI 40.17 kg/m   Physical Exam Constitutional:      Appearance: Normal appearance.  Cardiovascular:     Rate and Rhythm: Normal rate.  Pulmonary:     Effort: Pulmonary effort is normal.  Abdominal:     General: Abdomen is flat.     Comments: LLQ drain appropriately dressed. Dressing is clean, dry, intact. Drain incision site non-tender, without evidence of infection. Retaining suture and Stat Lock in place. <5 mL clear serous output in bulb. Line flushes well.     Musculoskeletal:        General: Normal range of motion.  Skin:    General: Skin is warm and dry.  Neurological:     Mental Status: She is alert and oriented to person, place, and time.      Labs:  CBC: Recent Labs    10/19/23 0327 10/20/23 0401 10/22/23 0429 10/25/23 0545  WBC 11.1* 11.9* 9.1 6.8  HGB 8.8* 8.6* 8.8* 9.0*  HCT 27.9* 27.9* 28.1* 29.1*  PLT 384 417* 484* 393    COAGS: Recent Labs    10/16/23 1149  INR 1.2    BMP: Recent Labs    10/19/23 0327 10/20/23 0401 10/22/23 0429 10/25/23 0545  NA 138 138 138 139  K 3.9 4.0 3.9 3.9  CL 104 104 104 105  CO2 23 24 25 25   GLUCOSE 92 81 81 90  BUN 20 19 17 12   CALCIUM   8.0* 8.1* 8.6* 8.7*  CREATININE 1.00 0.98 1.12* 0.89  GFRNONAA >60 >60 56* >60    LIVER FUNCTION TESTS: Recent Labs    10/10/23 0445 10/11/23 1330 10/12/23 0345 10/15/23 1026 10/18/23 0320  BILITOT 0.8 0.4  --  0.6 0.5  AST 14* 18  --  15 16  ALT 9 11  --  10 13  ALKPHOS 62 63  --  85 118  PROT 6.1* 6.0*  --  6.3* 5.8*  ALBUMIN  2.1* 2.0* 1.6* 1.7* 1.7*    Assessment and Plan:  Drain Location: LLQ Size:  Fr size: 12 Fr Date of placement: 10/16/23  Currently to: Drain collection device: suction bulb 24 hour output:  Output by Drain (mL) 10/23/23 0701 - 10/23/23 1900 10/23/23 1901 - 10/24/23 0700 10/24/23 0701 - 10/24/23 1900 10/24/23 1901 - 10/25/23 0700 10/25/23 0701 - 10/25/23 1520  Closed System Drain Left;Anterior Abdomen Bulb (JP) 10 Fr.  12.5  5   Negative Pressure Wound Therapy Abdomen Medial  100 50 0     Interval imaging/drain manipulation:  None.  Current examination: Flushes/aspirates easily.  Insertion site unremarkable. Suture and stat lock in place. Dressed appropriately.   Plan: Per Dr. Jennefer, CT A/P ordered for evaluation of fluid collection with low output. Possible drain injection to follow. If fluid collection is resolved, and no fistula noted on drain injection, IR will consider removing drain. Continue TID flushes with 5 cc NS. Record output Q shift. Dressing changes QD or PRN if soiled.  Call IR APP or on call IR MD if difficulty flushing or sudden change in drain output.  Repeat imaging/possible drain injection once output < 10 mL/QD (excluding flush material). Consideration for drain removal if output is < 10 mL/QD (excluding flush material), pending discussion with the providing surgical service.  Discharge planning: Please contact IR APP or on call IR MD prior to patient d/c to ensure appropriate follow up plans are in place. Typically patient will follow up with IR clinic 10-14 days post d/c for repeat imaging/possible drain injection. IR  scheduler will contact patient with date/time of appointment. Patient will need to flush drain QD with 5 cc NS, record output QD, dressing changes every 2-3 days or earlier if soiled.   IR will continue to follow - please call with questions or concerns.    Thank you for this interesting consult.  I greatly enjoyed meeting [name] and look forward to participating in their care.   Electronically Signed: Carlin DELENA Griffon, PA-C 10/25/2023, 3:20 PM     I spent a total of 15 Minutes at the the patient's bedside AND on the patient's hospital floor or unit, greater than 50% of which was counseling/coordinating care for LLQ drain follow-up.

## 2023-10-26 ENCOUNTER — Inpatient Hospital Stay (HOSPITAL_COMMUNITY)

## 2023-10-26 DIAGNOSIS — K56609 Unspecified intestinal obstruction, unspecified as to partial versus complete obstruction: Secondary | ICD-10-CM

## 2023-10-26 HISTORY — PX: IR CV LINE INJECTION: IMG2294

## 2023-10-26 LAB — CBC WITH DIFFERENTIAL/PLATELET
Abs Immature Granulocytes: 0.03 K/uL (ref 0.00–0.07)
Basophils Absolute: 0 K/uL (ref 0.0–0.1)
Basophils Relative: 0 %
Eosinophils Absolute: 0.3 K/uL (ref 0.0–0.5)
Eosinophils Relative: 4 %
HCT: 32.4 % — ABNORMAL LOW (ref 36.0–46.0)
Hemoglobin: 10 g/dL — ABNORMAL LOW (ref 12.0–15.0)
Immature Granulocytes: 0 %
Lymphocytes Relative: 12 %
Lymphs Abs: 0.9 K/uL (ref 0.7–4.0)
MCH: 28.2 pg (ref 26.0–34.0)
MCHC: 30.9 g/dL (ref 30.0–36.0)
MCV: 91.3 fL (ref 80.0–100.0)
Monocytes Absolute: 0.5 K/uL (ref 0.1–1.0)
Monocytes Relative: 6 %
Neutro Abs: 6 K/uL (ref 1.7–7.7)
Neutrophils Relative %: 78 %
Platelets: 388 K/uL (ref 150–400)
RBC: 3.55 MIL/uL — ABNORMAL LOW (ref 3.87–5.11)
RDW: 15.1 % (ref 11.5–15.5)
WBC: 7.7 K/uL (ref 4.0–10.5)
nRBC: 0 % (ref 0.0–0.2)

## 2023-10-26 LAB — COMPREHENSIVE METABOLIC PANEL WITH GFR
ALT: 14 U/L (ref 0–44)
AST: 16 U/L (ref 15–41)
Albumin: 2.1 g/dL — ABNORMAL LOW (ref 3.5–5.0)
Alkaline Phosphatase: 129 U/L — ABNORMAL HIGH (ref 38–126)
Anion gap: 14 (ref 5–15)
BUN: 13 mg/dL (ref 8–23)
CO2: 18 mmol/L — ABNORMAL LOW (ref 22–32)
Calcium: 8.4 mg/dL — ABNORMAL LOW (ref 8.9–10.3)
Chloride: 105 mmol/L (ref 98–111)
Creatinine, Ser: 1.01 mg/dL — ABNORMAL HIGH (ref 0.44–1.00)
GFR, Estimated: >60 mL/min (ref >60)
Glucose, Bld: 85 mg/dL (ref 70–99)
Potassium: 4.2 mmol/L (ref 3.5–5.1)
Sodium: 137 mmol/L (ref 135–145)
Total Bilirubin: 0.3 mg/dL (ref 0.0–1.2)
Total Protein: 6.3 g/dL — ABNORMAL LOW (ref 6.5–8.1)

## 2023-10-26 LAB — GLUCOSE, CAPILLARY: Glucose-Capillary: 82 mg/dL (ref 70–99)

## 2023-10-26 MED ORDER — ACETAMINOPHEN 325 MG PO TABS
650.0000 mg | ORAL_TABLET | Freq: Four times a day (QID) | ORAL | 0 refills | Status: DC
  Filled 2023-10-26: qty 150, 18d supply, fill #0

## 2023-10-26 MED ORDER — LIDOCAINE 5 % EX PTCH
2.0000 | MEDICATED_PATCH | CUTANEOUS | 0 refills | Status: DC
  Filled 2023-10-26: qty 60, 30d supply, fill #0

## 2023-10-26 MED ORDER — ONDANSETRON 4 MG PO TBDP
4.0000 mg | ORAL_TABLET | Freq: Three times a day (TID) | ORAL | 0 refills | Status: DC
  Filled 2023-10-26: qty 90, 30d supply, fill #0

## 2023-10-26 MED ORDER — DOCUSATE SODIUM 100 MG PO CAPS
100.0000 mg | ORAL_CAPSULE | Freq: Two times a day (BID) | ORAL | 0 refills | Status: AC
  Filled 2023-10-26: qty 60, 30d supply, fill #0

## 2023-10-26 MED ORDER — POLYETHYLENE GLYCOL 3350 17 GM/SCOOP PO POWD
17.0000 g | Freq: Two times a day (BID) | ORAL | 0 refills | Status: AC
  Filled 2023-10-26: qty 238, 7d supply, fill #0

## 2023-10-26 MED ORDER — PANTOPRAZOLE SODIUM 40 MG PO TBEC
40.0000 mg | DELAYED_RELEASE_TABLET | Freq: Every day | ORAL | 0 refills | Status: AC
  Filled 2023-10-26: qty 30, 30d supply, fill #0

## 2023-10-26 MED ORDER — MIDODRINE HCL 10 MG PO TABS
10.0000 mg | ORAL_TABLET | Freq: Three times a day (TID) | ORAL | 0 refills | Status: DC
  Filled 2023-10-26: qty 90, 30d supply, fill #0

## 2023-10-26 MED ORDER — DIATRIZOATE MEGLUMINE & SODIUM 66-10 % PO SOLN
90.0000 mL | Freq: Once | ORAL | Status: AC
  Administered 2023-10-26: 90 mL via ORAL
  Filled 2023-10-26: qty 90

## 2023-10-26 MED ORDER — PREGABALIN 50 MG PO CAPS
50.0000 mg | ORAL_CAPSULE | Freq: Two times a day (BID) | ORAL | 0 refills | Status: AC
  Filled 2023-10-26: qty 60, 30d supply, fill #0

## 2023-10-26 MED ORDER — OXYCODONE HCL 5 MG PO TABS
5.0000 mg | ORAL_TABLET | Freq: Four times a day (QID) | ORAL | 0 refills | Status: DC | PRN
  Filled 2023-10-26: qty 28, 7d supply, fill #0

## 2023-10-26 MED ORDER — IOHEXOL 300 MG/ML  SOLN
50.0000 mL | Freq: Once | INTRAMUSCULAR | Status: AC | PRN
  Administered 2023-10-26: 8 mL

## 2023-10-26 MED ORDER — METHOCARBAMOL 500 MG PO TABS
1000.0000 mg | ORAL_TABLET | Freq: Two times a day (BID) | ORAL | 0 refills | Status: DC
  Filled 2023-10-26: qty 120, 30d supply, fill #0

## 2023-10-26 NOTE — Progress Notes (Addendum)
 PROGRESS NOTE   Subjective/Complaints: Bowel obstruction noted on CT yesterday. Surgery consulted, pt reports she was seen this am. On liquid diet.   ROS: as per HPI. Denies CP, SOB,  V/D/C, or any other complaints at this time.   +cough- improving +nausea-improved +pain at abdominal wound   Objective:   IR INJECT INDWELLING DRAINAGE CATHETER Result Date: 10/26/2023 CLINICAL DATA:  Left lower quadrant drainage catheter has diminished output and CT findings suggest resolution of fluid cavity. Sinogram with evaluation for removal. FLUOROSCOPY: Radiation Exposure Index (as provided by the fluoroscopic device): 8.3 mGy Kerma PROCEDURE: The procedure, risks, benefits, and alternatives were explained to the patient. Questions regarding the procedure were encouraged and answered. The patient understands and consents to the procedure. In a supine position, the left lower quadrant drainage catheter was injected with dilute contrast. Contrast flows easily into the catheter and pools at the catheter tip. Small volume of contrast flows away from the catheter, but no evidence of fistula. No significant fluid collection. Retention suture and drainage catheter were cut. The drainage catheter was completely removed from the patient. Sterile dressing was applied. COMPLICATIONS: None IMPRESSION: Resolution of fluid collection without evidence of fistula. Drainage catheter removed. Electronically Signed   By: Cordella Banner   On: 10/26/2023 08:52   CT ABDOMEN PELVIS W CONTRAST Result Date: 10/25/2023 EXAM: CT ABDOMEN AND PELVIS WITH CONTRAST 10/25/2023 08:29:30 PM TECHNIQUE: CT of the abdomen and pelvis was performed with the administration of 75 mL of iohexol  (OMNIPAQUE ) 350 MG/ML intravenous contrast. Multiplanar reformatted images are provided for review. Automated exposure control, iterative reconstruction, and/or weight-based adjustment of the mA/kV was  utilized to reduce the radiation dose to as low as reasonably achievable. COMPARISON: CT with intravenous contrast 10/15/2023 and 10/07/2023. CLINICAL HISTORY: Evaluation of fluid collection s/p drain placement for possible drain removal. FINDINGS: LOWER CHEST: The cardiac size is normal. There is minimal pericardial effusion. There is a moderate-sized left pleural effusion, increased. There is a trace right pleural effusion, interval decrease. There are calcified granulomas in the compressed left lower lobe tissue alongside the pleural effusion. The lung bases are otherwise clear. No basilar  pneumothorax. LIVER: The liver is mildly steatotic with focal fat in segment 4b without mass. GALLBLADDER AND BILE DUCTS: There are a few noncalcified stones in the gallbladder, better demonstrated previously, without wall thickening or biliary dilatation. SPLEEN: The spleen is unremarkable. PANCREAS: The pancreas is partially atrophic and otherwise unremarkable. ADRENAL GLANDS: The adrenal glands are unremarkable. KIDNEYS, URETERS AND BLADDER: Right kidney asymmetrically small with scattered cortical scarring measuring 8.6 cm in length. There are a few scar-like cortical defects in the left kidney. There is no renal mass, stone, or hydronephrosis. No perinephric or periureteral stranding. Urinary bladder is unremarkable. GI AND BOWEL: Interval worsening of now high grade small bowel obstruction with increased fluid dilatation of the stomach, duodenum, and left abdominal small bowel. The obstructive point is in the left lower abdominopelvic quadrant medial to a newly noted pigtail drainage catheter, and the level of obstruction is probably mid ileal, with collapsed segments in the right hemiabdomen and maximum small bowel caliber up to 5 cm on the left. The obstructive  etiology is probably inflammatory adhesions related to now resolved fluid collections at this level. The appendix is not seen in this patient. 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 The large bowel wall is normal in thickness. PERITONEUM AND RETROPERITONEUM: Left adnexal/pelvic pigtail drainage catheter on series 2 axial images 68-70 is in the location of the largest of three pelvic collections previously seen at this level on 10/15/2023. All 3 of the prior pelvic collections have resolved. There previously was a small collection anterior to the distal right common iliac artery which has also resolved. There previously was a small collection slightly to the left of the aortic bifurcation which has also resolved. There is patchy mesenteric edema in the mid to lower abdomen which could be congestive, third spacing related or inflammatory. Minimal deep pelvic ascites appears similar. The mesenteric edema, although still present, is less than previously. No free air. VASCULATURE: Aorta is normal in caliber. Mild aortic atherosclerosis without aneurysm. LYMPH NODES: No adenopathy is seen. REPRODUCTIVE ORGANS: No acute abnormality. BONES AND SOFT TISSUES: There are degenerative changes of the lower lumbar spine, lower thoracic spine. Metallic anchors in the pubic bone. Acquired spinal stenosis L3-L4 and L4-L5. Body wall edema is again noted in the flanks and upper thighs, slightly improved and most likely due to fluid overload related third spacing. No acute osseous abnormality. No focal soft tissue abnormality. IMPRESSION: 1. Interval worsening of now high-grade, approximately mid ileal small bowel obstruction with transition point in the left lower abdominopelvic quadrant medial 2 a pigtail drainage catheter, likely inflammatory/adhesive in etiology due to resolved adjacent fluid collections. 2. All previously noted pelvic fluid collections have resolved. 3. Moderate-sized left pleural effusion, increased. 4. Trace right pleural effusion, decreased. 5. Mesenteric edema in  the mid to lower abdomen, decreased but persistent, possibly related to congestion, third spacing, or inflammation. 6. Remaining findings described above. 7. These results will be telephoned to the referring provider or the referring providers representative by professional radiology assistant [facility]) personnel, with communication documented in the [facility] dashboard. Electronically signed by: Francis Quam MD 10/25/2023 09:24 PM EDT RP Workstation: [id number]    Recent Labs    10/25/23 0545 10/26/23 0849  WBC 6.8 7.7  HGB 9.0* 10.0*  HCT 29.1* 32.4*  PLT 393 388   Recent Labs    10/25/23 0545 10/26/23 0623  NA 139 137  K 3.9 4.2  CL 105 105  CO2 25 18*  GLUCOSE 90 85  BUN 12 13  CREATININE 0.89 1.01*  CALCIUM  8.7* 8.4*        Intake/Output Summary (Last 24 hours) at 10/26/2023 1037 Last data filed at 10/26/2023 1034 Gross per 24 hour  Intake 118 ml  Output 750 ml  Net -632 ml        Physical Exam: Vital Signs Blood pressure 105/62, pulse 74, temperature 97.6 F (36.4 C), temperature source Oral, resp. rate 18, height 5' 8.5 (1.74 m), weight 122.7 kg, SpO2 94%.  General: sitting in WC, working with therapy, she is very pleasant Heart: Regular rate and rhythm no rubs murmurs or extra sounds Lungs: Clear to auscultation, breathing unlabored, no rales or wheezes Abdomen: LLQ colostomy small amount of brown liquid stool in bag, wound vac with good seal.  Positive bowel sounds, soft, some tenderness to palpation around wounds, nondistended + Percutaneous drain - has been removed Extremities: No clubbing, cyanosis, or edema Skin: No evidence  of breakdown, no evidence of rash over exposed surfaces aside from aforementioned wounds.   Neurologic: Cranial nerves II through XII intact, motor strength is 4/5 in bilateral deltoid, bicep, tricep, grip, 4- / 5hip flexor, knee extensors, ankle dorsiflexor and plantar flexor Sensory exam normal sensation to light touch and  proprioception in bilateral upper and lower extremities pt states she has neuropathy and her feet feel numb  Prior neuro assessment is c/w today's exam 10/26/2023.    Musculoskeletal: Full range of motion in all 4 extremities. No joint swelling  PICC line right upper extremity  Assessment/Plan: 1. Functional deficits which require 3+ hours per day of interdisciplinary therapy in a comprehensive inpatient rehab setting. Physiatrist is providing close team supervision and 24 hour management of active medical problems listed below. Physiatrist and rehab team continue to assess barriers to discharge/monitor patient progress toward functional and medical goals  Care Tool:  Bathing    Body parts bathed by patient: Right arm, Left arm, Abdomen, Chest, Front perineal area, Buttocks, Right upper leg, Left upper leg, Face   Body parts bathed by helper: Right lower leg, Left lower leg     Bathing assist Assist Level: Minimal Assistance - Patient > 75%     Upper Body Dressing/Undressing Upper body dressing   What is the patient wearing?: Pull over shirt    Upper body assist Assist Level: Supervision/Verbal cueing    Lower Body Dressing/Undressing Lower body dressing      What is the patient wearing?: Pants, Underwear/pull up (pad)     Lower body assist Assist for lower body dressing: Minimal Assistance - Patient > 75%     Toileting Toileting    Toileting assist Assist for toileting: Minimal Assistance - Patient > 75%     Transfers Chair/bed transfer  Transfers assist     Chair/bed transfer assist level: Minimal Assistance - Patient > 75%     Locomotion Ambulation   Ambulation assist      Assist level: Contact Guard/Touching assist Assistive device: Walker-rolling Max distance: 121   Walk 10 feet activity   Assist     Assist level: Contact Guard/Touching assist Assistive device: Walker-rolling   Walk 50 feet activity   Assist    Assist level:  Contact Guard/Touching assist Assistive device: Walker-rolling    Walk 150 feet activity   Assist Walk 150 feet activity did not occur: Safety/medical concerns (fatigue/SOB)         Walk 10 feet on uneven surface  activity   Assist Walk 10 feet on uneven surfaces activity did not occur: Safety/medical concerns (fatigue)         Wheelchair     Assist Is the patient using a wheelchair?: Yes Type of Wheelchair: Manual    Wheelchair assist level: Supervision/Verbal cueing Max wheelchair distance: 50    Wheelchair 50 feet with 2 turns activity    Assist        Assist Level: Supervision/Verbal cueing   Wheelchair 150 feet activity     Assist      Assist Level: Maximal Assistance - Patient 25 - 49%   Blood pressure 105/62, pulse 74, temperature 97.6 F (36.4 C), temperature source Oral, resp. rate 18, height 5' 8.5 (1.74 m), weight 122.7 kg, SpO2 94%.  Medical Problem List and Plan: 1. 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 Advised pt on team conference and evals, how d/c planning works.  -Expected DC 10/27/23 -Addendum but will adjust to 10/28/23  2.  Antithrombotics: -DVT/anticoagulation:  Pharmaceutical: Lovenox  40mg  daily             -antiplatelet therapy: N/A 3. Pain Management: Still using IV fentanyl  only for wound vac changes --continue tylenol  650mg  qid scheduled+PRN, Robaxin 1000 mg QID and oxycodone  prn, lidoderm  patches. Also on lyrica 50mg  BID -10/15 patient reports pain is overall controlled with oxycodone , she tries not to use this very frequently.   Continue current regimen 4. Mood/Behavior/Sleep: LCSW to follow for evaluation and support             --Trazodone prn  -takes Xanax  at home (states it's 0.5-1mg  TID PRN but always takes 1mg )-- also uses for sleep.  -10/21/23 updated xanax  indication for sleep if trazodone ineffective, discussed trying trazodone for sleep. Did NOT increase xanax  to avoid oversedation, and discussed that with pt.  -10/22/23 appears he slept better with as needed trazodone, continue current regimen and monitor -10/17 using PRN trazodone for sleep             -antipsychotic agents: N/A 5. Neuropsych/cognition: This patient is capable of making decisions on her  own behalf. 6. Skin/Wound Care/ostomy LLQ: Wound VAC change twice a week by WOC             --continue to encourage intake.  -10/21/23 very anxious about ostomy education-- might benefit to have WOC come an extra day per week to see her specifically about ostomy education.  -10/13 surgery PA Almarie Pringle okay with removal of drain per IR -10/14 called IR regarding drain removal -10/15 WOC possible change to wet to dry if wound becomes more shallow -10/16 discussed with WOC, will continue outpatient wound VAC.  Social work reports this is ordered. Call IR  today -10/17 Drain removed by IR 7. Fluids/Electrolytes/Nutrition: monitor I&O and labs, continue vitamins and supplements              --Continue Ensure BID. Add Juven BID -10/21/23 Mg being checked daily, but has been WNL, 2.0 today. Will d/c daily checks, can check as needed.  8.  Abdominal abscess: Drain in place. Cultures negative. --per  CCS to continue Zosyn /Micafungin for 14 days with EOT 10/21/23 9.  Malnutrition: Po intake poor and TPN weaned off 10/10             --continue Ensure BID, juven 10. Hypotension: Continue midodrine 10 mg TID  -10/13-17 BP overall stable continue to monitor Vitals:   10/22/23 1304 10/22/23 2006 10/23/23 0401 10/23/23 1540  BP: (!) 106/42 (!) 134/52 (!)  117/56 128/67   10/23/23 1922 10/24/23 0442 10/24/23 1332 10/24/23 2121  BP: 134/75 (!) 104/52 113/62 134/64   10/25/23 0550 10/25/23 1512 10/25/23 2203 10/26/23 0512  BP: (!) 102/55 122/62 122/75 105/62    11.  ANCA associated vasculitis/ILD:  Methotrexate  and Cellcept  on hold. Continue Incruse inhaler  - 10/13 discussed with surgery, hold methotrexate  and CellCept  for at least 4 weeks if possible unless urgent need 12.  T2DM: Hgb A1C- 4.6. Monitor BS. Was on Ozempic outpatient -10/21/23 pt states she's been on ozempic and her diabetes  was under great control, obviously off of that now-- will add on CBG checks daily in the AM to monitor, if noting rising fasting sugars, might need to check more often. Caution with supplements being carb heavy -10/16 AM Cbg stable CBG (last 3)  Recent Labs    10/25/23 0604 10/26/23 0622  GLUCAP 88 82    13. Obesity: Educate on diet and exercise to help with mobility and overall health 14. H/o Depression: On prozac 40mg  daily and Wellbutrin  450mg  daily.  15. Bowel management: hx of profound chronic constipation, now with colostomy. Continue Colace 100mg  BID, miralax  daily. Monitor ostomy output.  16. ABLA/thrombocytosis: Hgb 8's, monitor routinely. Plt 417 likely reactive.   - 9/13 hemoglobin stable at 8.8/platelets 484K, continue to monitor  - 9/16 hemoglobin stable at 9, platelets improved to 3 93K 17.  Pneumonia  - Start as needed Delsym, patient reports she does not tolerate guaifenesin  - 10/14 start antibiotics for left lower lobe airspace opacity, discussed with pharmacy Augmentin  for 5 days.  -10/15-16 symptoms improved, continue current  18. Small bowel obstruction noted on imaging. Noted incidentally on CT. Has been seen by surgery. Full liquid diet. Has been tolerating diet overall. Only small amount of output in her ostomy bag.  Xray small bowl protocol appears to have been ordered by surgical team  -Adjust discharge day to Sunday   LOS: 6  days A FACE TO FACE EVALUATION WAS PERFORMED  Murray Collier 10/26/2023, 10:37 AM

## 2023-10-26 NOTE — Progress Notes (Incomplete)
 Inpatient Rehabilitation Care Coordinator Discharge Note   Patient Details  Name: [name] MRN: [id number] Date of Birth: October 14, 1960   Discharge location: Home with sister, [name]  Length of Stay: 7 days  Discharge activity level: Independent with assistive device  Home/community participation: Active in the community  Patient response un:Yzjouy Literacy - How often do you need to have someone help you when you read instructions, pamphlets, or other written material from your doctor or pharmacy?: Never  Patient response un:Dnrpjo Isolation - How often do you feel lonely or isolated from those around you?: Never  Services provided included: MD, RD, PT, OT, SLP, RN, CM, TR, Pharmacy, Neuropsych, SW  Financial Services:  Field seismologist Utilized: Private Insurance [employer] / [employer] NAP  Choices offered to/list presented to: Patient  Follow-up services arranged:  Home Health Home Health Agency: [phone]         Patient response to transportation need: Is the patient able to respond to transportation needs?: Yes In the past 12 months, has lack of transportation kept you from medical appointments or from getting medications?: No In the past 12 months, has lack of transportation kept you from meetings, work, or from getting things needed for daily living?: No   Patient/Family verbalized understanding of follow-up arrangements:  Yes  Individual responsible for coordination of the follow-up plan: Patient  Confirmed correct DME delivered: [name]  Loreli 10/26/2023    Comments (or additional information):Wound Vac delivered. DME 3 in 1 commode delivered. HH with [facility] PT/OT/SN.  Summary of Stay    Date/Time Discharge Planning CSW  10/24/23 1305 Patient plans to discharge back home with her husband, [name]. She plans to continue assisting her sister [name] with caring for their demented mother 24/7. Wound vac will be removed prior to discharge. HH PT/OT/SN to be set up. 3-1  commode ordered through [employer]. DS  10/23/23 1314 Patient plans to discharge back home with her husband, [name]. She plans to continue assisting her sister [name] with caring for their demented mother 24/7. Will need wound and wound vac education. Awaiting therapy follow up recommendations. DS       [name]  Loreli

## 2023-10-26 NOTE — Progress Notes (Signed)
 Occupational Therapy Session Note  Patient Details  Name: [name] MRN: [id number] Date of Birth: 1960/05/20   Session 1: Today's Date: 10/26/2023 OT Individual Time: 9168-9068 OT Individual Time Calculation (min): 60 min   Session 1: Today's Date: 10/26/2023 OT Individual Time: 8866-8797 OT Individual Time Calculation (min): 29 min   Short Term Goals: Week 1:  OT Short Term Goal 1 (Week 1): LTG=STG mod I   Session 1: Skilled Therapeutic Interventions/Progress Updates:    Patient agreeable to participate in OT session. Reports 7/10 pain level.   Patient participated in skilled OT session focusing on therapeutic use of self, anxiety management, home discharge planning, and self care. When OT arrived patient found in bed with nursing present receiving medication. Patient demonstrating anxious behavior. Surgical PA arrived to inform patient of results of scan from yesterday. Patient then became increasingly anxious due to news OT utilized therapeutic use of self to increase patients ability to complete alternate activities due to fixation. Patient completed bed mobility mod I. OT and patient discussed discharge plans due to scan results. Patient completed bathing and dressing activities at sink. Expressed concern due to minimal output in ostomy bag. Discussed plans with Dr. Murray which relieved patient. Patient was returned to bed following B & D completion and left with all needs in reach.   Session 2: Skilled Therapeutic Interventions/Progress Updates:    Patient agreeable to participate in OT session. Reports 8/10 pain level, however attempting to take less pain medicine due to decreased output..   Patient participated in skilled OT session focusing on dynamic balance to ensure safety at home and facilitate modified independence in all ADLS. Patient able to complete bed mobility mod I, sit to stand mod I. Patient demonstrated good dynamic balance utilizing RW for grabbing daily  items, reaching, and completing single leg balance. Patient provided with long handled sponge to increase ability to complete bathing independently. Returned to bed alarm on all needs in reach.   Therapy Documentation Precautions:  Precautions Precautions: Fall Recall of Precautions/Restrictions: Impaired Precaution/Restrictions Comments: Wound vac, abdominal protection precs; LLQ JP drain, ostomy, IV antibiotics Restrictions Weight Bearing Restrictions Per Provider Order: No   Therapy/Group: Individual Therapy  D'mariea L Arvis Miguez 10/26/2023, 7:26 AM

## 2023-10-26 NOTE — Progress Notes (Signed)
 Occupational Therapy Discharge Summary  Patient Details  Name: [name] MRN: [id number] Date of Birth: Feb 23, 1960  Date of Discharge from OT service:October 26, 2023   Patient has met 7 of 7 long term goals due to improved activity tolerance, improved balance, postural control, and ability to compensate for deficits.  Patient to discharge at overall Modified Independent level.  Patient's care partner is independent to provide the necessary physical assistance at discharge.    Reasons goals not met: N/A  Recommendation:  Patient will benefit from ongoing skilled OT services in home health setting to continue to advance functional skills in the area of BADL and Reduce care partner burden.  Equipment: 3 in 1, RW  Reasons for discharge: treatment goals met and discharge from hospital  Patient/family agrees with progress made and goals achieved: Yes  OT Discharge Precautions/Restrictions  Precautions Precautions: Fall Recall of Precautions/Restrictions: Impaired Precaution/Restrictions Comments: impulsive Restrictions Weight Bearing Restrictions Per Provider Order: No  Pain Pain Assessment Pain Scale: 0-10 Pain Score: 8  Pain Location: Abdomen Pain Intervention(s): Medication (See eMAR) ADL ADL Eating: Independent Grooming: Modified independent Upper Body Bathing: Modified independent Where Assessed-Upper Body Bathing: Sitting at sink Lower Body Bathing: Modified independent Where Assessed-Lower Body Bathing: Sitting at sink Upper Body Dressing: Modified independent (Device) Where Assessed-Upper Body Dressing: Sitting at sink Lower Body Dressing: Modified independent Where Assessed-Lower Body Dressing: Sitting at sink Toileting: Modified independent Where Assessed-Toileting: Bedside Commode Toilet Transfer: Distant supervision, Modified independent Toilet Transfer Method: Ambulating, Stand pivot Toilet Transfer Equipment: Drop arm bedside commode, Raised toilet  seat Film/video editor: Not assessed Vision Baseline Vision/History: 1 Wears glasses Patient Visual Report: Blurring of vision Vision Assessment?: Wears glasses for reading Perception  Perception: Within Functional Limits Praxis Praxis: WFL Cognition Cognition Overall Cognitive Status: Within Functional Limits for tasks assessed Orientation Level: Person;Place;Situation Brief Interview for Mental Status (BIMS) Repetition of Three Words (First Attempt): 3 Temporal Orientation: Year: Correct Temporal Orientation: Month: Accurate within 5 days Temporal Orientation: Day: Correct Recall: Sock: Yes, no cue required Recall: Blue: Yes, no cue required Recall: Bed: Yes, no cue required BIMS Summary Score: 15 Sensation Sensation Light Touch: Impaired Detail Light Touch Impaired Details: Impaired RLE Proprioception: Impaired Detail Proprioception Impaired Details: Impaired RLE Coordination Gross Motor Movements are Fluid and Coordinated: No Fine Motor Movements are Fluid and Coordinated: Yes Motor  Motor Motor: Other (comment) Motor - Skilled Clinical Observations: generalized weakness Mobility  Bed Mobility Bed Mobility: Rolling Right;Rolling Left;Supine to Sit;Sit to Supine Rolling Right: Independent with assistive device Rolling Left: Independent with assistive device Supine to Sit: Independent with assistive device Sit to Supine: Independent with assistive device Transfers Sit to Stand: Independent with assistive device  Trunk/Postural Assessment  Cervical Assessment Cervical Assessment: Within Functional Limits Thoracic Assessment Thoracic Assessment: Within Functional Limits Lumbar Assessment Lumbar Assessment: Within Functional Limits Postural Control Postural Control: Within Functional Limits  Balance Balance Balance Assessed: Yes Static Sitting Balance Static Sitting - Balance Support: Bilateral upper extremity supported Static Sitting - Level of  Assistance: 6: Modified independent (Device/Increase time) Dynamic Sitting Balance Dynamic Sitting - Balance Support: During functional activity Dynamic Sitting - Level of Assistance: 6: Modified independent (Device/Increase time) Static Standing Balance Static Standing - Balance Support: During functional activity Static Standing - Level of Assistance: 6: Modified independent (Device/Increase time) Dynamic Standing Balance Dynamic Standing - Balance Support: During functional activity Dynamic Standing - Level of Assistance: 5: Stand by assistance Extremity/Trunk Assessment RUE Assessment RUE Assessment: Within Functional Limits Active  Range of Motion (AROM) Comments: WFL General Strength Comments: 4/5 LUE Assessment LUE Assessment: Within Functional Limits Active Range of Motion (AROM) Comments: WFL General Strength Comments: 4/5   Prepared by: Michaelyn LITTIE Seip 10/26/2023, 11:51 AM  Signed by: Nereida Habermann, OTR/L, MSOT 10/27/23

## 2023-10-26 NOTE — Progress Notes (Signed)
 Physical Therapy Session Note  Patient Details  Name: [name] MRN: [id number] Date of Birth: Feb 07, 1960  Today's Date: 10/26/2023 PT Individual Time: 1030-1131, 1350-1445 PT Individual Time Calculation (min): 61 min, 55 min   Short Term Goals: Week 1:  PT Short Term Goal 1 (Week 1): STG=LTG 2/2 ELOS  Skilled Therapeutic Interventions/Progress Updates:      Treatment 1: Pt supine in bed upon arrival, nursing present, and patient highly anxious due to change in diet and increased diarrhea in ostomy. Pt reports 7-8/10 pain but agreeable to therapy. Rest breaks and repositioning provided.  Pt performed bed mobility including R/L rolling and supine to sitting EOB transfer with mod I. Seated EOB, pt emptied ostomy bag with with min A x 3 (due to increased output/diarrhea) and significant encouragement from PT due to heightened anxiety. Pt demonstrated good recall of technique and sequencing.  PT provided significant amount of education on fall prevention during activity and at home as well as reviewing ways to make home environment more safe. PT also reviewed energy conservation and importance of taking breaks.  Pt performed stand pivot transfer EOB to [facility] without AD with supervision. Pt used single UE support on RW for clothing management with supervision.  At end of session, pt returned to supine in bed mod I and all need within reach upon PT exiting room.  Treatment 2: Pt supine in bed upon arrival. Patient denies pain and agreeable to therapy.  Pt ambulated 176 ft using RW with mod I. After seated rest break, pt ambulated ~ 150 ft to ortho gym. Pt performed short distance ambulatory transfer to car simulator with supervision and verbal cueing for sequencing.  Pt ambulated up/down ramp 10 ft using RW with supervision.  Pt transported dependent in [facility] back to room for time/energy conservation.  Pt returned to supine in bed mod I, all need within reach, and all questions answered  upon PT exiting room.  Therapy Documentation Precautions:  Precautions Precautions: Fall Recall of Precautions/Restrictions: Impaired Precaution/Restrictions Comments: ostomy Restrictions Weight Bearing Restrictions Per Provider Order: No  Therapy/Group: Individual Therapy  [name], DPT 10/26/2023, 5:16 PM

## 2023-10-26 NOTE — Progress Notes (Incomplete)
 Inpatient Rehabilitation Discharge Medication Review by a Pharmacist  A complete drug regimen review was completed for this patient to identify any potential clinically significant medication issues.  High Risk Drug Classes Is patient taking? 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   Opioid    Antiplatelet No   Hypoglycemics/insulin  No   Vasoactive Medication Yes Midodrine - hypotension Nitroglycerin  PRN- chest pain   Chemotherapy No   Other Yes Bupropion , Fluoxetine - mood Robaxin- muscle spasms   Pregabalin - nerve pain Spiriva  respimat - ILD Acetaminophen - pain  Trazodone -insomnia Alprazolam  - anxiety Docusate, PEG- constipation  Ergocalciferol, docusate, MVI, folic acid , VitC - supplements       Type of Medication Issue Identified Description of Issue Recommendation(s)  Drug Interaction(s) (clinically significant)     Duplicate Therapy     Allergy     No Medication Administration End Date     Incorrect Dose     Additional Drug Therapy Needed     Significant med changes from prior encounter (inform family/care partners about these prior to discharge). 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   Restart or discontinue PTA meds not resumed in CIR at discharge if clinically indicated.   Other       Clinically significant medication issues were identified that warrant physician communication and completion of prescribed/recommended actions by midnight of the next day:  No  Name of provider notified for urgent issues identified:   Provider Method of Notification:     Pharmacist comments:   Time spent performing this drug regimen review  (minutes):  20

## 2023-10-26 NOTE — Plan of Care (Signed)
  Problem: RH Balance Goal: LTG Patient will maintain dynamic sitting balance (PT) Description: LTG:  Patient will maintain dynamic sitting balance with assistance during mobility activities (PT) Outcome: Completed/Met Goal: LTG Patient will maintain dynamic standing balance (PT) Description: LTG:  Patient will maintain dynamic standing balance with assistance during mobility activities (PT) Outcome: Completed/Met   Problem: Sit to Stand Goal: LTG:  Patient will perform sit to stand with assistance level (PT) Description: LTG:  Patient will perform sit to stand with assistance level (PT) Outcome: Completed/Met   Problem: RH Bed Mobility Goal: LTG Patient will perform bed mobility with assist (PT) Description: LTG: Patient will perform bed mobility with assistance, with/without cues (PT). Outcome: Completed/Met   Problem: RH Bed to Chair Transfers Goal: LTG Patient will perform bed/chair transfers w/assist (PT) Description: LTG: Patient will perform bed to chair transfers with assistance (PT). Outcome: Completed/Met   Problem: RH Car Transfers Goal: LTG Patient will perform car transfers with assist (PT) Description: LTG: Patient will perform car transfers with assistance (PT). Outcome: Completed/Met   Problem: RH Furniture Transfers Goal: LTG Patient will perform furniture transfers w/assist (OT/PT) Description: LTG: Patient will perform furniture transfers  with assistance (OT/PT). Outcome: Completed/Met   Problem: RH Ambulation Goal: LTG Patient will ambulate in controlled environment (PT) Description: LTG: Patient will ambulate in a controlled environment, # of feet with assistance (PT). Outcome: Completed/Met Goal: LTG Patient will ambulate in home environment (PT) Description: LTG: Patient will ambulate in home environment, # of feet with assistance (PT). Outcome: Completed/Met

## 2023-10-26 NOTE — Progress Notes (Addendum)
 RN did not empty patient's colostomy bag this morning due to minimal output. It looked about the same amount as last night. Patient also agreed to drink miralax  with grape juice at 2249. Abdominal pain level was at 4 this morning.

## 2023-10-26 NOTE — Discharge Summary (Signed)
 Physician Discharge Summary  Patient ID: [name] MRN: [id number] DOB/AGE: [birth year] 63 y.o.  Admit date: 10/20/2023 Discharge date: 10/26/2023  Discharge Diagnoses:  Principal Problem:   Debility Active Problems:   Obesity (BMI 30-39.9)   Acute blood loss anemia   GERD (gastroesophageal reflux disease)   Hypotension   Prerenal azotemia   Constipation    Insomnia    Microscopic polyangitis    T2DM diet controlled.    Anxiety w/depression.    Discharged Condition: stable  Significant Diagnostic Studies: DG Abd Portable 1V-Small Bowel Obstruction Protocol-initial, 8 hr delay Result Date: 10/27/2023 EXAM: 1 VIEW XRAY OF THE ABDOMEN 10/26/2023 06:33:00 PM COMPARISON: None available. CLINICAL HISTORY: SOB FINDINGS: LINES, TUBES AND DEVICES: Left lower quadrant ostomy noted. BOWEL: Nonobstructive bowel gas pattern. SOFT TISSUES: No opaque urinary calculi. BONES: No acute osseous abnormality. PLEURAL SPACES: Small left pleural effusion. IMPRESSION: 1. No acute radiographic abnormality in the abdomen or pelvis. 2. Small left pleural effusion. Electronically signed by: Norman Gatlin MD 10/27/2023 01:49 AM EDT RP Workstation: [id number]   IR INJECT INDWELLING DRAINAGE CATHETER Result Date: 10/26/2023 CLINICAL DATA:  Left lower quadrant drainage catheter has diminished output and CT findings suggest resolution of fluid cavity. Sinogram with evaluation for removal. FLUOROSCOPY: Radiation Exposure Index (as provided by the fluoroscopic device): 8.3 mGy Kerma PROCEDURE: The procedure, risks, benefits, and alternatives were explained to the patient. Questions regarding the procedure were encouraged and answered. The patient understands and consents to the procedure. In a supine position, the left lower quadrant drainage catheter was injected with dilute contrast. Contrast flows easily into the catheter and pools at the catheter tip. Small volume of contrast flows away from the catheter, but  no evidence of fistula. No significant fluid collection. Retention suture and drainage catheter were cut. The drainage catheter was completely removed from the patient. Sterile dressing was applied. COMPLICATIONS: None IMPRESSION: Resolution of fluid collection without evidence of fistula. Drainage catheter removed. Electronically Signed   By: Cordella Banner   On: 10/26/2023 08:52   CT ABDOMEN PELVIS W CONTRAST Result Date: 10/25/2023 EXAM: CT ABDOMEN AND PELVIS WITH CONTRAST 10/25/2023 08:29:30 PM TECHNIQUE: CT of the abdomen and pelvis was performed with the administration of 75 mL of iohexol  (OMNIPAQUE ) 350 MG/ML intravenous contrast. Multiplanar reformatted images are provided for review. Automated exposure control, iterative reconstruction, and/or weight-based adjustment of the mA/kV was utilized to reduce the radiation dose to as low as reasonably achievable. COMPARISON: CT with intravenous contrast 10/15/2023 and 10/07/2023. CLINICAL HISTORY: Evaluation of fluid collection s/p drain placement for possible drain removal. FINDINGS: LOWER CHEST: The cardiac size is normal. There is minimal pericardial effusion. There is a moderate-sized left pleural effusion, increased. There is a trace right pleural effusion, interval decrease. There are calcified granulomas in the compressed left lower lobe tissue alongside the pleural effusion. The lung bases are otherwise clear. No basilar  pneumothorax. LIVER: The liver is mildly steatotic with focal fat in segment 4b without mass. GALLBLADDER AND BILE DUCTS: There are a few noncalcified stones in the gallbladder, better demonstrated previously, without wall thickening or biliary dilatation. SPLEEN: The spleen is unremarkable. PANCREAS: The pancreas is partially atrophic and otherwise unremarkable. ADRENAL GLANDS: The adrenal glands are unremarkable. KIDNEYS, URETERS AND BLADDER: Right kidney asymmetrically small with scattered cortical scarring measuring 8.6 cm in  length. There are a few scar-like cortical defects in the left kidney. There is no renal mass, stone, or hydronephrosis. No perinephric or periureteral stranding. Urinary bladder is unremarkable.  GI AND BOWEL: Interval worsening of now high grade small bowel obstruction with increased fluid dilatation of the stomach, duodenum, and left abdominal small bowel. The obstructive point is in the left lower abdominopelvic quadrant medial to a newly noted pigtail drainage catheter, and the level of obstruction is probably mid ileal, with collapsed segments in the right hemiabdomen and maximum small bowel caliber up to 5 cm on the left. The obstructive etiology is probably inflammatory adhesions related to now resolved fluid collections at this level. The appendix is not seen in this patient. 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 The large bowel wall is normal in thickness. PERITONEUM AND RETROPERITONEUM: Left adnexal/pelvic pigtail drainage catheter on series 2 axial images 68-70 is in the location of the largest of three pelvic collections previously seen at this level on 10/15/2023. All 3 of the prior pelvic collections have resolved. There previously was a small collection anterior to the distal right common iliac artery which has also resolved. There previously was a small collection slightly to the left of the aortic bifurcation which has also resolved. There is patchy mesenteric edema in the mid to lower abdomen which could be congestive, third spacing related or inflammatory. Minimal deep pelvic ascites appears similar. The mesenteric edema, although still present, is less than previously. No free air. VASCULATURE: Aorta is normal in caliber. Mild aortic atherosclerosis without aneurysm. LYMPH NODES: No adenopathy is seen. REPRODUCTIVE ORGANS: No acute abnormality. BONES AND SOFT TISSUES: There are  degenerative changes of the lower lumbar spine, lower thoracic spine. Metallic anchors in the pubic bone. Acquired spinal stenosis L3-L4 and L4-L5. Body wall edema is again noted in the flanks and upper thighs, slightly improved and most likely due to fluid overload related third spacing. No acute osseous abnormality. No focal soft tissue abnormality. IMPRESSION: 1. Interval worsening of now high-grade, approximately mid ileal small bowel obstruction with transition point in the left lower abdominopelvic quadrant medial 2 a pigtail drainage catheter, likely inflammatory/adhesive in etiology due to resolved adjacent fluid collections. 2. All previously noted pelvic fluid collections have resolved. 3. Moderate-sized left pleural effusion, increased. 4. Trace right pleural effusion, decreased. 5. Mesenteric edema in the mid to lower abdomen, decreased but persistent, possibly related to congestion, third spacing, or inflammation. 6. Remaining findings described above. 7. These results will be telephoned to the referring provider or the referring providers representative by professional radiology assistant [facility]) personnel, with communication documented in the [facility] dashboard. Electronically signed by: Francis Quam MD 10/25/2023 09:24 PM EDT RP Workstation: [id number]   DG Chest 2 View Result Date: 10/22/2023 CLINICAL DATA:  Cough EXAM: CHEST - 2 VIEW COMPARISON:  10/11/2023 FINDINGS: Airspace opacity in the left lower lobe concerning for pneumonia. Right lung is clear. Heart mediastinal contours within normal limits. No definite effusion. Right PICC line tip in the upper SVC. IMPRESSION: New left lower lobe airspace opacity concerning for pneumonia. Electronically Signed   By: Franky Crease M.D.   On: 10/22/2023 20:27    Labs:  Basic Metabolic Panel:    Latest Ref Rng & Units 10/26/2023    6:23 AM 10/25/2023    5:45 AM 10/22/2023    4:29 AM  BMP  Glucose 70 - 99 mg/dL 85  90  81   BUN 8 - 23 mg/dL 13   12  17    Creatinine 0.44 - 1.00 mg/dL 8.98  9.10  8.87  Sodium 135 - 145 mmol/L 137  139  138   Potassium 3.5 - 5.1 mmol/L 4.2  3.9  3.9   Chloride 98 - 111 mmol/L 105  105  104   CO2 22 - 32 mmol/L 18  25  25    Calcium  8.9 - 10.3 mg/dL 8.4  8.7  8.6      CBC:    Latest Ref Rng & Units 10/26/2023    8:49 AM 10/25/2023    5:45 AM 10/22/2023    4:29 AM  CBC  WBC 4.0 - 10.5 K/uL 7.7  6.8  9.1   Hemoglobin 12.0 - 15.0 g/dL 89.9  9.0  8.8   Hematocrit 36.0 - 46.0 % 32.4  29.1  28.1   Platelets 150 - 400 K/uL 388  393  484      CBG: Recent Labs  Lab 10/25/23 0604 10/26/23 0622 10/27/23 0459 10/28/23 0503  GLUCAP 88 82 85 91    Brief HPI:   [name] is a 63 y.o. female with history of ANCA associated vasculitis, anxiety, depression, T2DM, chronic fatigue, colitis, HTN, low back pain with sciatica, ILD, microscopic polyangiitis, stercoral perforation of sigmoid colon in the past was admitted on 11/06/2023 with 3-week history of constipation.  She was found to have sigmoid colitis with bowel perforation and peritonitis.  She was taken to or emergently for exploratory lap with sigmoid resection and Hartman's colostomy on the same day by Dr. Gregorio.  Postop placed on broad-spectrum antibiotics and started on TNA for nutritional support due to ileus.   Hospital course significant for poor pain control, delirium as well as issues with anxiety.  CT abdomen pelvis 10/6 showed multiple pelvic abscesses and IR drain was placed with fluid cultures showing no growth.  She was started on Zosyn  and micafungin with recommendations for 2 weeks antibiotic regimen.  Hypertension has been managed with midodrine for BP support.  PT OT was consulted and patient was noted to be limited by pain, weakness and requiring multiple rest breaks with minimal activity.  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  CIR was recommended due to functional decline.   Hospital Course: COBIE LEIDNER was  admitted to rehab 10/20/2023 for inpatient therapies to consist of PT and OT at least three hours five days a week. Past admission physiatrist, therapy team and rehab RN have worked together to provide customized collaborative inpatient rehab.  Subcu Lovenox  was used for DVT prophylaxis during the day.  Her blood pressures were monitored on TID basis and have been reasonably controlled off BP meds.  She continues on midodrine for blood pressure support and was advised to hold off on BP meds until follow-up with PCP and cardiology.  P.o. intake has been good and bowel program has been adjusted.  She completed her antibiotic course on 10/21/2023.  Follow-up CBC shows white count to be stable and acute blood loss anemia is improving.  No fevers noted off of antibiotics. Abdominal wound VAC dressing changes have been ongoing twice a week by wound care and wound VAC to continue to help promote healing after discharge.  Ensure supplements as well as Juven was added to help promote wound healing.  Pain has been controlled with as needed use of oxycodone .  She was noted to have increasing cough and chest x-ray 10/14 showed concerns of left lower lobe pneumonia which was treated with 5-day course of Augmentin .    Blood sugars were monitored briefly and were noted  to be within normal limits therefore CBG checks were discontinued as hemoglobin A1c 4.6.  CT abdomen pelvis was done in anticipation of drain removal  and showed interval worsening of high-grade approximately mid ileal small bowel obstruction.  IR drain was removed as fluid collections had resolved.  Her diet was downgraded to liquids and surgery was consulted for input.  As patient was not clinically obstructed a partially obstructed p.o. SBO protocol was ordered to assess function.  This showed no signs of small bowel obstruction and she tolerated diet advancement without any GI symptoms. Her insomnia  has been managed with use of trazodone. She has been  educated on ostomy care and her confidence with care has improved.  She reports that she has appointment to follow-up with rheumatology for input on resumption of her CellCept  and methotrexate .  She has made good gains during her rehab stay and is modified independent at discharge.  She will continue to receive follow-up home health PT, OT and RN by [facility] after discharge   Rehab course: During patient's stay in rehab weekly team conferences were held to monitor patient's progress, set goals and discuss barriers to discharge. At admission, patient required min assist with basic ADL tasks and with mobility. She  has had improvement in activity tolerance, balance, postural control as well as ability to compensate for deficits.  She is able to complete ADL tasks with modified independent level.  She is modified independent for transfers and is able to ambulate 176 feet with use of rolling walker.   Discharge disposition: 06-Home-Health Care Svc  Diet: Regular.   Special Instructions: Routine ostomy care. Adjust miralax  to make sure the stools are correct consistently.   Allergies as of 10/28/2023       Reactions   Mucinex [guaifenesin Er] Nausea And Vomiting   Sulfa Antibiotics Nausea And Vomiting   Glucophage [metformin] Nausea Only        Medication List     STOP taking these medications    carvedilol  12.5 MG tablet Commonly known as: COREG    docusate 50 MG/5ML liquid Commonly known as: COLACE Replaced by: docusate sodium  100 MG capsule   methotrexate  50 MG/2ML injection   micafungin in sodium chloride  0.9 % 100 mL   mycophenolate  500 MG tablet Commonly known as: CELLCEPT    Ozempic (2 MG/DOSE) 8 MG/3ML Sopn Generic drug: Semaglutide (2 MG/DOSE)   piperacillin -tazobactam 3.375 GM/50ML IVPB Commonly known as: ZOSYN    polyethylene glycol 17 g packet Commonly known as: MIRALAX  / GLYCOLAX  Replaced by: polyethylene glycol powder 17 GM/SCOOP powder    sacubitril -valsartan  49-51 MG Commonly known as: Entresto    vitamin A 3 MG (10000 UNITS) capsule       TAKE these medications    acetaminophen  325 MG tablet Commonly known as: TYLENOL  Take 2 tablets (650 mg total) by mouth 4 (four) times daily.   ALPRAZolam  1 MG tablet Commonly known as: XANAX  Take 1 tablet (1 mg total) by mouth 3 (three) times daily as needed for anxiety. What changed:  how much to take when to take this additional instructions   ascorbic acid 250 MG tablet Commonly known as: VITAMIN C Take 1 tablet (250 mg total) by mouth 2 (two) times daily.   buPROPion  150 MG 24 hr tablet Commonly known as: WELLBUTRIN  XL Take 450 mg by mouth daily.   docusate sodium  100 MG capsule Commonly known as: COLACE Take 1 capsule (100 mg total) by mouth 2 (two) times daily. Replaces: docusate 50  MG/5ML liquid   ergocalciferol 1.25 MG (50000 UT) capsule Commonly known as: VITAMIN D2 Take 50,000 Units by mouth every Sunday.   FLUoxetine 40 MG capsule Commonly known as: PROZAC Take 40 mg by mouth daily.   folic acid  1 MG tablet Commonly known as: FOLVITE  Take 1 mg by mouth daily.   lactose free nutrition Liqd Take 237 mLs by mouth 2 (two) times daily between meals. What changed: Another medication with the same name was removed. Continue taking this medication, and follow the directions you see here.   lidocaine  5 % Commonly known as: LIDODERM  Place 2 patches onto the skin daily. Remove & Discard patch within 12 hours or as directed by MD Notes to patient: Has to be off for at least 12 hours   methocarbamol 500 MG tablet Commonly known as: ROBAXIN Take 2 tablets (1,000 mg total) by mouth 2 (two) times daily. What changed:  medication strength when to take this   midodrine 10 MG tablet Commonly known as: PROAMATINE Take 1 tablet (10 mg total) by mouth 3 (three) times daily with meals. Notes to patient: To support your blood pressure   multivitamin with  minerals Tabs tablet Take 1 tablet by mouth daily.   nitroGLYCERIN  0.4 MG SL tablet Commonly known as: NITROSTAT  Place 1 tablet (0.4 mg total) under the tongue every 5 (five) minutes as needed for chest pain.   ondansetron  4 MG disintegrating tablet Commonly known as: ZOFRAN -ODT Take 1 tablet (4 mg total) by mouth every 8 (eight) hours.   oxyCODONE  5 MG immediate release tablet--Rx# 28 pills.  Commonly known as: Oxy IR/ROXICODONE  Take 1 tablet (5 mg total) by mouth every 6 (six) hours as needed for severe pain (pain score 7-10). What changed:  when to take this reasons to take this Notes to patient: Limit to one pill twice a day as needed (dose decreased)   pantoprazole  40 MG tablet Commonly known as: PROTONIX  Take 1 tablet (40 mg total) by mouth daily.   polyethylene glycol powder 17 GM/SCOOP powder Commonly known as: GLYCOLAX /MIRALAX  Dissolve 1 capful (17g) in 4-8 ounces of liquid and take by mouth 2 (two) times daily. (Take 17 g by mouth 2 (two) times daily. Dissolve 1 capful (17g) in 4-8 ounces of liquid and take by mouth daily.) Replaces: polyethylene glycol 17 g packet   pregabalin 50 MG capsule Commonly known as: LYRICA Take 1 capsule (50 mg total) by mouth 2 (two) times daily. What changed:  medication strength how much to take   Spiriva  Respimat 1.25 MCG/ACT Aers Generic drug: Tiotropium Bromide Inhale 2 puffs into the lungs daily. What changed:  when to take this reasons to take this        Follow-up Information     Rubin Calamity, MD. Go on 11/16/2023.   Specialty: General Surgery Why: at 10:00 AM for post-operative follow up. Please arrive by 9:30 AM. Contact information: [address] [place] [address] [phone]         Urbano Albright, MD. Call.   Specialty: Physical Medicine and Rehabilitation Why: As needed Contact information: [address] [place] [address] [phone]          Lynwood Laneta ORN, PA-C. Call on 10/29/2023.   Specialties: Family Medicine, Physician Assistant Why: to set up post hospital follow up appointment Contact information: [address] [place] [address] [phone]  Signed: Sharlet GORMAN Schmitz 10/30/2023, 4:47 PM

## 2023-10-26 NOTE — Progress Notes (Signed)
 Physical Therapy Discharge Summary  Patient Details  Name: [name] MRN: [id number] Date of Birth: 07/25/1960  Date of Discharge from PT service:October 26, 2023   Patient has met 9 of 9 long term goals due to improved activity tolerance, improved balance, improved postural control, increased strength, ability to compensate for deficits, and improved awareness.  Patient to discharge at an ambulatory level Modified Independent.   Patient's care partner is independent to provide the necessary physical assistance at discharge.  Reasons goals not met: N/A  Recommendation:  Patient will benefit from ongoing skilled PT services in home health setting to continue to advance safe functional mobility, address ongoing impairments in strength, endurance, ambulation, balance, and minimize fall risk.  Equipment: Pt has bariatric RW and hurry cane at home  Reasons for discharge: treatment goals met and discharge from hospital  Patient/family agrees with progress made and goals achieved: Yes  PT Discharge Precautions/Restrictions Precautions Precautions: Fall Recall of Precautions/Restrictions: Impaired Precaution/Restrictions Comments: ostomy, wound vac Restrictions Weight Bearing Restrictions Per Provider Order: No Pain Interference Pain Interference Pain Effect on Sleep: 3. Frequently Pain Interference with Therapy Activities: 1. Rarely or not at all Pain Interference with Day-to-Day Activities: 2. Occasionally Vision/Perception  Vision - History Ability to See in Adequate Light: 0 Adequate Perception Perception: Within Functional Limits Praxis Praxis: WFL  Cognition Overall Cognitive Status: Within Functional Limits for tasks assessed Arousal/Alertness: Awake/alert Orientation Level: Oriented X4 Attention: Sustained;Selective Sustained Attention: Appears intact Selective Attention: Appears intact Memory: Appears intact Behaviors: Other (comment) Safety/Judgment: Appears  intact Sensation Sensation Additional Comments: pt endorses baseline B LE peripheral neuropathy Coordination Gross Motor Movements are Fluid and Coordinated: No Coordination and Movement Description: limited due to weakness and neuropathy Motor  Motor Motor: Within Functional Limits Motor - Skilled Clinical Observations: generalized weakness  Mobility Bed Mobility Bed Mobility: Rolling Right;Rolling Left;Supine to Sit;Sit to Supine Rolling Right: Independent Rolling Left: Independent Supine to Sit: Independent Sit to Supine: Independent Transfers Transfers: Sit to Stand;Stand to Sit;Stand Pivot Transfers Sit to Stand: Independent with assistive device Stand to Sit: Independent with assistive device Stand Pivot Transfers: Independent with assistive device Transfer (Assistive device): Rolling walker Locomotion  Gait Ambulation: Yes Gait Assistance: Independent with assistive device Gait Distance (Feet): 176 Feet Assistive device: Rolling walker Gait Gait: Yes Gait Pattern: Impaired Gait Pattern: Step-through pattern;Decreased stride length;Decreased trunk rotation;Trunk flexed Gait velocity: decreased Stairs / Additional Locomotion Stairs: No Pick up small object from the floor assist level: Independent with assistive device Pick up small object from the floor assistive device: reacher Wheelchair Mobility Wheelchair Mobility: No  Trunk/Postural Assessment  Cervical Assessment Cervical Assessment: Within Functional Limits Thoracic Assessment Thoracic Assessment: Within Functional Limits Lumbar Assessment Lumbar Assessment: Within Functional Limits Postural Control Postural Control: Within Functional Limits  Balance Balance Balance Assessed: Yes Static Sitting Balance Static Sitting - Balance Support: No upper extremity supported Static Sitting - Level of Assistance: 7: Independent Dynamic Sitting Balance Dynamic Sitting - Balance Support: During functional  activity Dynamic Sitting - Level of Assistance: 7: Independent;6: Modified independent (Device/Increase time) Static Standing Balance Static Standing - Level of Assistance: 6: Modified independent (Device/Increase time) Dynamic Standing Balance Dynamic Standing - Level of Assistance: 6: Modified independent (Device/Increase time);5: Stand by assistance Extremity Assessment  RLE Assessment General Strength Comments: grossly 4-/5, except hip flexion 3+/5 LLE Assessment General Strength Comments: grossly 4/5, except hip flexion 3+/5   [name], DPT 10/26/2023, 5:39 PM

## 2023-10-26 NOTE — Progress Notes (Addendum)
 Subjective/Chief Complaint: Reports vomiting roughing 48 hours ago. Denies nausea today. Reports some gas in her colostomy bad yesterday and small amt new stool today. Prior to this her ostomy has not been functioning well for a couple days. No new pain - had her IR drain removed yesterday.   Objective: Vital signs in last 24 hours: Temp:  [97.6 F (36.4 C)-98.7 F (37.1 C)] 97.6 F (36.4 C) (10/17 0512) Pulse Rate:  [72-74] 74 (10/17 0512) Resp:  [16-19] 18 (10/17 0512) BP: (105-122)/(62-75) 105/62 (10/17 0512) SpO2:  [94 %-98 %] 94 % (10/17 0512) Weight:  [122.7 kg] 122.7 kg (10/17 0512) Last BM Date : 10/25/23  Intake/Output from previous day: 10/16 0701 - 10/17 0700 In: 118 [P.O.:118] Out: -  Intake/Output this shift: Total I/O In: -  Out: 1625 [Stool:1625]  GEN: alert NAD Pulm: normal effort CV: RRR Abd: soft, appropriately tender, NPWT holding suction, ostomy pouch with small amt semisolid brown stool    Lab Results:  Recent Labs    10/25/23 0545 10/26/23 0849  WBC 6.8 7.7  HGB 9.0* 10.0*  HCT 29.1* 32.4*  PLT 393 388    BMET Recent Labs    10/25/23 0545 10/26/23 0623  NA 139 137  K 3.9 4.2  CL 105 105  CO2 25 18*  GLUCOSE 90 85  BUN 12 13  CREATININE 0.89 1.01*  CALCIUM  8.7* 8.4*   PT/INR No results for input(s): LABPROT, INR in the last 72 hours.  ABG No results for input(s): PHART, HCO3 in the last 72 hours.  Invalid input(s): PCO2, PO2  Studies/Results: IR INJECT INDWELLING DRAINAGE CATHETER Result Date: 10/26/2023 CLINICAL DATA:  Left lower quadrant drainage catheter has diminished output and CT findings suggest resolution of fluid cavity. Sinogram with evaluation for removal. FLUOROSCOPY: Radiation Exposure Index (as provided by the fluoroscopic device): 8.3 mGy Kerma PROCEDURE: The procedure, risks, benefits, and alternatives were explained to the patient. Questions regarding the procedure were encouraged and answered.  The patient understands and consents to the procedure. In a supine position, the left lower quadrant drainage catheter was injected with dilute contrast. Contrast flows easily into the catheter and pools at the catheter tip. Small volume of contrast flows away from the catheter, but no evidence of fistula. No significant fluid collection. Retention suture and drainage catheter were cut. The drainage catheter was completely removed from the patient. Sterile dressing was applied. COMPLICATIONS: None IMPRESSION: Resolution of fluid collection without evidence of fistula. Drainage catheter removed. Electronically Signed   By: Cordella Banner   On: 10/26/2023 08:52   CT ABDOMEN PELVIS W CONTRAST Result Date: 10/25/2023 EXAM: CT ABDOMEN AND PELVIS WITH CONTRAST 10/25/2023 08:29:30 PM TECHNIQUE: CT of the abdomen and pelvis was performed with the administration of 75 mL of iohexol  (OMNIPAQUE ) 350 MG/ML intravenous contrast. Multiplanar reformatted images are provided for review. Automated exposure control, iterative reconstruction, and/or weight-based adjustment of the mA/kV was utilized to reduce the radiation dose to as low as reasonably achievable. COMPARISON: CT with intravenous contrast 10/15/2023 and 10/07/2023. CLINICAL HISTORY: Evaluation of fluid collection s/p drain placement for possible drain removal. FINDINGS: LOWER CHEST: The cardiac size is normal. There is minimal pericardial effusion. There is a moderate-sized left pleural effusion, increased. There is a trace right pleural effusion, interval decrease. There are calcified granulomas in the compressed left lower lobe tissue alongside the pleural effusion. The lung bases are otherwise clear. No basilar  pneumothorax. LIVER: The liver is mildly steatotic with focal fat in  segment 4b without mass. GALLBLADDER AND BILE DUCTS: There are a few noncalcified stones in the gallbladder, better demonstrated previously, without wall thickening or biliary  dilatation. SPLEEN: The spleen is unremarkable. PANCREAS: The pancreas is partially atrophic and otherwise unremarkable. ADRENAL GLANDS: The adrenal glands are unremarkable. KIDNEYS, URETERS AND BLADDER: Right kidney asymmetrically small with scattered cortical scarring measuring 8.6 cm in length. There are a few scar-like cortical defects in the left kidney. There is no renal mass, stone, or hydronephrosis. No perinephric or periureteral stranding. Urinary bladder is unremarkable. GI AND BOWEL: Interval worsening of now high grade small bowel obstruction with increased fluid dilatation of the stomach, duodenum, and left abdominal small bowel. The obstructive point is in the left lower abdominopelvic quadrant medial to a newly noted pigtail drainage catheter, and the level of obstruction is probably mid ileal, with collapsed segments in the right hemiabdomen and maximum small bowel caliber up to 5 cm on the left. The obstructive etiology is probably inflammatory adhesions related to now resolved fluid collections at this level. The appendix is not seen in this patient. 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 The large bowel wall is normal in thickness. PERITONEUM AND RETROPERITONEUM: Left adnexal/pelvic pigtail drainage catheter on series 2 axial images 68-70 is in the location of the largest of three pelvic collections previously seen at this level on 10/15/2023. All 3 of the prior pelvic collections have resolved. There previously was a small collection anterior to the distal right common iliac artery which has also resolved. There previously was a small collection slightly to the left of the aortic bifurcation which has also resolved. There is patchy mesenteric edema in the mid to lower abdomen which could be congestive, third spacing related or inflammatory. Minimal deep pelvic ascites appears similar. The  mesenteric edema, although still present, is less than previously. No free air. VASCULATURE: Aorta is normal in caliber. Mild aortic atherosclerosis without aneurysm. LYMPH NODES: No adenopathy is seen. REPRODUCTIVE ORGANS: No acute abnormality. BONES AND SOFT TISSUES: There are degenerative changes of the lower lumbar spine, lower thoracic spine. Metallic anchors in the pubic bone. Acquired spinal stenosis L3-L4 and L4-L5. Body wall edema is again noted in the flanks and upper thighs, slightly improved and most likely due to fluid overload related third spacing. No acute osseous abnormality. No focal soft tissue abnormality. IMPRESSION: 1. Interval worsening of now high-grade, approximately mid ileal small bowel obstruction with transition point in the left lower abdominopelvic quadrant medial 2 a pigtail drainage catheter, likely inflammatory/adhesive in etiology due to resolved adjacent fluid collections. 2. All previously noted pelvic fluid collections have resolved. 3. Moderate-sized left pleural effusion, increased. 4. Trace right pleural effusion, decreased. 5. Mesenteric edema in the mid to lower abdomen, decreased but persistent, possibly related to congestion, third spacing, or inflammation. 6. Remaining findings described above. 7. These results will be telephoned to the referring provider or the referring providers representative by professional radiology assistant [facility]) personnel, with communication documented in the [facility] dashboard. Electronically signed by: Francis Quam MD 10/25/2023 09:24 PM EDT RP Workstation: [id number]     Anti-infectives: Anti-infectives (From admission, onward)    Start     Dose/Rate Route Frequency Ordered Stop   10/23/23 1345  amoxicillin -clavulanate (AUGMENTIN ) 875-125 MG per tablet 1 tablet        1 tablet Oral Every 12 hours 10/23/23 1249 10/28/23 0759   10/20/23 2200  micafungin (MYCAMINE) 150 mg in sodium chloride  0.9 % 100 mL IVPB        150 mg 107.5 mL/hr  over 1 Hours Intravenous Every 24 hours 10/20/23 1332 10/21/23 2300   10/20/23 1430  piperacillin -tazobactam (ZOSYN ) IVPB 3.375 g        3.375 g 12.5 mL/hr over 240 Minutes Intravenous Every 8 hours 10/20/23 1332 10/22/23 0308       Assessment/Plan: s/p * No surgery found * Advance diet. Allow soft food today OOB/ PT Sigmoid bowel perforation with feculent peritonitis, likely due to stercoral colitis vs diverticulitis POD#19 s/p  LAPAROTOMY, EXPLORATORY, Sigmoid colon resection, Hartman's colostomy 9.28 Dr. Rubin - soft diet, started weaning TNA 10/5, calorie count; twice daily chocolate boost ordered;  - CT scan 10/6 shows multiple pelvis abscesses, s/p IR perc drain 10/7; drain removed 10/16 - Vac change Mon/Thurs; appreciate WOC RN assistance - CT yesterday also showed possible SBO with transition point in LLQ. Patient is clinically not obstructed vs partially obstructed. Will order PO SBO protocol to further assess. If contrast progresses to the colon and she is tolerating PO then discharge home tomorrow or Sunday is not unreasonable, will just have to see how she does. She tells me that plan was for her to go home tomorrow. No emergent role for surgery at present.  If she vomits or has worsening abdominal distention she needs a 60F or 26F  NG tube placed to LIWS.  FEN: sips of clears ok ID: completed abx. 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 Nephrologist: [name]  LOS: 6 days    [name] 10/26/2023

## 2023-10-26 NOTE — Plan of Care (Signed)
  Problem: Consults Goal: RH GENERAL PATIENT EDUCATION Description: See Patient Education module for education specifics. Outcome: Progressing Goal: Skin Care Protocol Initiated - if Braden Score 18 or less Description: If consults are not indicated, leave blank or document N/A Outcome: Progressing Goal: Nutrition Consult-if indicated Outcome: Progressing   Problem: RH BOWEL ELIMINATION Goal: RH STG MANAGE BOWEL WITH ASSISTANCE Description: STG Manage Bowel with mod I Assistance. Outcome: Progressing   Problem: RH SKIN INTEGRITY Goal: RH STG SKIN FREE OF INFECTION/BREAKDOWN Description: Manage w min assist Outcome: Progressing   Problem: RH SAFETY Goal: RH STG ADHERE TO SAFETY PRECAUTIONS W/ASSISTANCE/DEVICE Description: STG Adhere to Safety Precautions With cues Assistance/Device. Outcome: Progressing   Problem: RH PAIN MANAGEMENT Goal: RH STG PAIN MANAGED AT OR BELOW PT'S PAIN GOAL Description: Pain < 4 with prns Outcome: Progressing   Problem: RH KNOWLEDGE DEFICIT GENERAL Goal: RH STG INCREASE KNOWLEDGE OF SELF CARE AFTER HOSPITALIZATION Description: Manage care using educational resources for medications and dietary modification independently Outcome: Progressing   Problem: Education: Goal: Knowledge of ostomy care will improve Outcome: Progressing Goal: Understanding of discharge needs will improve Outcome: Progressing   Problem: Bowel/Gastric/Urinary: Goal: Gastrointestinal status for postoperative course will improve Outcome: Progressing   Problem: Coping: Goal: Coping ability will improve Outcome: Progressing   Problem: Health Behavior/Discharge Planning: Goal: Ability to manage health-related needs will improve Outcome: Progressing   Problem: Nutrition: Goal: Will attain and maintain optimal nutritional status will improve Outcome: Progressing

## 2023-10-27 ENCOUNTER — Other Ambulatory Visit (HOSPITAL_COMMUNITY): Payer: Self-pay

## 2023-10-27 DIAGNOSIS — L02211 Cutaneous abscess of abdominal wall: Secondary | ICD-10-CM

## 2023-10-27 DIAGNOSIS — G8918 Other acute postprocedural pain: Secondary | ICD-10-CM

## 2023-10-27 DIAGNOSIS — T81321D Disruption or dehiscence of closure of internal operation (surgical) wound of abdominal wall muscle or fascia, subsequent encounter: Secondary | ICD-10-CM

## 2023-10-27 LAB — GLUCOSE, CAPILLARY: Glucose-Capillary: 85 mg/dL (ref 70–99)

## 2023-10-27 NOTE — Plan of Care (Signed)
°  Problem: RH Balance °Goal: LTG Patient will maintain dynamic standing with ADLs (OT) °Description: LTG:  Patient will maintain dynamic standing balance with assist during activities of daily living (OT)  °Outcome: Completed/Met °  °Problem: Sit to Stand °Goal: LTG:  Patient will perform sit to stand in prep for activites of daily living with assistance level (OT) °Description: LTG:  Patient will perform sit to stand in prep for activites of daily living with assistance level (OT) °Outcome: Completed/Met °  °Problem: RH Bathing °Goal: LTG Patient will bathe all body parts with assist levels (OT) °Description: LTG: Patient will bathe all body parts with assist levels (OT) °Outcome: Completed/Met °  °Problem: RH Dressing °Goal: LTG Patient will perform upper body dressing (OT) °Description: LTG Patient will perform upper body dressing with assist, with/without cues (OT). °Outcome: Completed/Met °Goal: LTG Patient will perform lower body dressing w/assist (OT) °Description: LTG: Patient will perform lower body dressing with assist, with/without cues in positioning using equipment (OT) °Outcome: Completed/Met °  °Problem: RH Toileting °Goal: LTG Patient will perform toileting task (3/3 steps) with assistance level (OT) °Description: LTG: Patient will perform toileting task (3/3 steps) with assistance level (OT)  °Outcome: Completed/Met °  °Problem: RH Toilet Transfers °Goal: LTG Patient will perform toilet transfers w/assist (OT) °Description: LTG: Patient will perform toilet transfers with assist, with/without cues using equipment (OT) °Outcome: Completed/Met °  °

## 2023-10-27 NOTE — Progress Notes (Signed)
 PROGRESS NOTE   Subjective/Complaints: Having some stomach discomfort but feeling ok, not much stool in ostomy, loose . ABD xray with SBO protocol was unremarkable. Surgery ok'ed her for regular diet  ROS: Patient denies fever, rash, sore throat, blurred vision, dizziness,   vomiting, diarrhea, cough, shortness of breath or chest pain, joint or back/neck pain, headache, or mood change.   Objective:   DG Abd Portable 1V-Small Bowel Obstruction Protocol-initial, 8 hr delay Result Date: 10/27/2023 EXAM: 1 VIEW XRAY OF THE ABDOMEN 10/26/2023 06:33:00 PM COMPARISON: None available. CLINICAL HISTORY: SOB FINDINGS: LINES, TUBES AND DEVICES: Left lower quadrant ostomy noted. BOWEL: Nonobstructive bowel gas pattern. SOFT TISSUES: No opaque urinary calculi. BONES: No acute osseous abnormality. PLEURAL SPACES: Small left pleural effusion. IMPRESSION: 1. No acute radiographic abnormality in the abdomen or pelvis. 2. Small left pleural effusion. Electronically signed by: Norman Gatlin MD 10/27/2023 01:49 AM EDT RP Workstation: [id number]   IR INJECT INDWELLING DRAINAGE CATHETER Result Date: 10/26/2023 CLINICAL DATA:  Left lower quadrant drainage catheter has diminished output and CT findings suggest resolution of fluid cavity. Sinogram with evaluation for removal. FLUOROSCOPY: Radiation Exposure Index (as provided by the fluoroscopic device): 8.3 mGy Kerma PROCEDURE: The procedure, risks, benefits, and alternatives were explained to the patient. Questions regarding the procedure were encouraged and answered. The patient understands and consents to the procedure. In a supine position, the left lower quadrant drainage catheter was injected with dilute contrast. Contrast flows easily into the catheter and pools at the catheter tip. Small volume of contrast flows away from the catheter, but no evidence of fistula. No significant fluid collection. Retention  suture and drainage catheter were cut. The drainage catheter was completely removed from the patient. Sterile dressing was applied. COMPLICATIONS: None IMPRESSION: Resolution of fluid collection without evidence of fistula. Drainage catheter removed. Electronically Signed   By: Cordella Banner   On: 10/26/2023 08:52   CT ABDOMEN PELVIS W CONTRAST Result Date: 10/25/2023 EXAM: CT ABDOMEN AND PELVIS WITH CONTRAST 10/25/2023 08:29:30 PM TECHNIQUE: CT of the abdomen and pelvis was performed with the administration of 75 mL of iohexol  (OMNIPAQUE ) 350 MG/ML intravenous contrast. Multiplanar reformatted images are provided for review. Automated exposure control, iterative reconstruction, and/or weight-based adjustment of the mA/kV was utilized to reduce the radiation dose to as low as reasonably achievable. COMPARISON: CT with intravenous contrast 10/15/2023 and 10/07/2023. CLINICAL HISTORY: Evaluation of fluid collection s/p drain placement for possible drain removal. FINDINGS: LOWER CHEST: The cardiac size is normal. There is minimal pericardial effusion. There is a moderate-sized left pleural effusion, increased. There is a trace right pleural effusion, interval decrease. There are calcified granulomas in the compressed left lower lobe tissue alongside the pleural effusion. The lung bases are otherwise clear. No basilar  pneumothorax. LIVER: The liver is mildly steatotic with focal fat in segment 4b without mass. GALLBLADDER AND BILE DUCTS: There are a few noncalcified stones in the gallbladder, better demonstrated previously, without wall thickening or biliary dilatation. SPLEEN: The spleen is unremarkable. PANCREAS: The pancreas is partially atrophic and otherwise unremarkable. ADRENAL GLANDS: The adrenal glands are unremarkable. KIDNEYS, URETERS AND BLADDER: Right kidney asymmetrically small with scattered cortical scarring measuring 8.6  cm in length. There are a few scar-like cortical defects in the left  kidney. There is no renal mass, stone, or hydronephrosis. No perinephric or periureteral stranding. Urinary bladder is unremarkable. GI AND BOWEL: Interval worsening of now high grade small bowel obstruction with increased fluid dilatation of the stomach, duodenum, and left abdominal small bowel. The obstructive point is in the left lower abdominopelvic quadrant medial to a newly noted pigtail drainage catheter, and the level of obstruction is probably mid ileal, with collapsed segments in the right hemiabdomen and maximum small bowel caliber up to 5 cm on the left. The obstructive etiology is probably inflammatory adhesions related to now resolved fluid collections at this level. The appendix is not seen in this patient. 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 The large bowel wall is normal in thickness. PERITONEUM AND RETROPERITONEUM: Left adnexal/pelvic pigtail drainage catheter on series 2 axial images 68-70 is in the location of the largest of three pelvic collections previously seen at this level on 10/15/2023. All 3 of the prior pelvic collections have resolved. There previously was a small collection anterior to the distal right common iliac artery which has also resolved. There previously was a small collection slightly to the left of the aortic bifurcation which has also resolved. There is patchy mesenteric edema in the mid to lower abdomen which could be congestive, third spacing related or inflammatory. Minimal deep pelvic ascites appears similar. The mesenteric edema, although still present, is less than previously. No free air. VASCULATURE: Aorta is normal in caliber. Mild aortic atherosclerosis without aneurysm. LYMPH NODES: No adenopathy is seen. REPRODUCTIVE ORGANS: No acute abnormality. BONES AND SOFT TISSUES: There are degenerative changes of the lower lumbar spine, lower thoracic spine.  Metallic anchors in the pubic bone. Acquired spinal stenosis L3-L4 and L4-L5. Body wall edema is again noted in the flanks and upper thighs, slightly improved and most likely due to fluid overload related third spacing. No acute osseous abnormality. No focal soft tissue abnormality. IMPRESSION: 1. Interval worsening of now high-grade, approximately mid ileal small bowel obstruction with transition point in the left lower abdominopelvic quadrant medial 2 a pigtail drainage catheter, likely inflammatory/adhesive in etiology due to resolved adjacent fluid collections. 2. All previously noted pelvic fluid collections have resolved. 3. Moderate-sized left pleural effusion, increased. 4. Trace right pleural effusion, decreased. 5. Mesenteric edema in the mid to lower abdomen, decreased but persistent, possibly related to congestion, third spacing, or inflammation. 6. Remaining findings described above. 7. These results will be telephoned to the referring provider or the referring providers representative by professional radiology assistant [facility]) personnel, with communication documented in the [facility] dashboard. Electronically signed by: Francis Quam MD 10/25/2023 09:24 PM EDT RP Workstation: [id number]    Recent Labs    10/25/23 0545 10/26/23 0849  WBC 6.8 7.7  HGB 9.0* 10.0*  HCT 29.1* 32.4*  PLT 393 388   Recent Labs    10/25/23 0545 10/26/23 0623  NA 139 137  K 3.9 4.2  CL 105 105  CO2 25 18*  GLUCOSE 90 85  BUN 12 13  CREATININE 0.89 1.01*  CALCIUM  8.7* 8.4*        Intake/Output Summary (Last 24 hours) at 10/27/2023 1145 Last data filed at 10/27/2023 1142 Gross per 24 hour  Intake 480 ml  Output 1125 ml  Net -645 ml        Physical Exam: Vital  Signs Blood pressure (!) 117/59, pulse 76, temperature 97.9 F (36.6 C), temperature source Oral, resp. rate 16, height 5' 8.5 (1.74 m), weight 118.5 kg, SpO2 96%.  Constitutional: No distress . Vital signs reviewed. HEENT: NCAT,  EOMI, oral membranes moist Neck: supple Cardiovascular: RRR without murmur. No JVD    Respiratory/Chest: CTA Bilaterally without wheezes or rales. Normal effort    GI/Abdomen: BS +, sl-tender, non-distended, ostomy with small amount of liquid brown stool in bag.  Ext: no clubbing, cyanosis, or edema Psych: pleasant and cooperative a Skin: No evidence of breakdown, no evidence of rash over exposed surfaces aside from aforementioned wounds.   Neurologic: Cranial nerves II through XII intact, motor strength is 4/5 in bilateral deltoid, bicep, tricep, grip, 4- / 5hip flexor, knee extensors, ankle dorsiflexor and plantar flexor Sensory exam normal sensation to light touch and proprioception in bilateral upper and lower extremities pt states she has neuropathy and her feet feel numb  Prior neuro assessment is c/w today's exam 10/27/2023.    Musculoskeletal: Full range of motion in all 4 extremities. No joint swelling  PICC line right upper extremity  Assessment/Plan: 1. Functional deficits which require 3+ hours per day of interdisciplinary therapy in a comprehensive inpatient rehab setting. Physiatrist is providing close team supervision and 24 hour management of active medical problems listed below. Physiatrist and rehab team continue to assess barriers to discharge/monitor patient progress toward functional and medical goals  Care Tool:  Bathing    Body parts bathed by patient: Right arm, Left arm, Abdomen, Chest, Front perineal area, Buttocks, Right upper leg, Left upper leg, Face, Right lower leg, Left lower leg   Body parts bathed by helper: Right lower leg, Left lower leg     Bathing assist Assist Level: Independent with assistive device     Upper Body Dressing/Undressing Upper body dressing   What is the patient wearing?: Pull over shirt    Upper body assist Assist Level: Independent with assistive device    Lower Body Dressing/Undressing Lower body dressing       What is the patient wearing?: Pants, Underwear/pull up     Lower body assist Assist for lower body dressing: Independent with assitive device     Toileting Toileting    Toileting assist Assist for toileting: Independent with assistive device     Transfers Chair/bed transfer  Transfers assist     Chair/bed transfer assist level: Independent with assistive device Chair/bed transfer assistive device: Geologist, engineering   Ambulation assist      Assist level: Independent with assistive device Assistive device: Walker-rolling Max distance: 176   Walk 10 feet activity   Assist     Assist level: Independent with assistive device Assistive device: Walker-rolling   Walk 50 feet activity   Assist    Assist level: Independent with assistive device Assistive device: Walker-rolling    Walk 150 feet activity   Assist Walk 150 feet activity did not occur: Safety/medical concerns (fatigue/SOB)  Assist level: Independent with assistive device Assistive device: Walker-rolling    Walk 10 feet on uneven surface  activity   Assist Walk 10 feet on uneven surfaces activity did not occur: Safety/medical concerns (fatigue)   Assist level: Supervision/Verbal cueing Assistive device: Walker-rolling   Wheelchair     Assist Is the patient using a wheelchair?: No Type of Wheelchair: Manual    Wheelchair assist level: Supervision/Verbal cueing Max wheelchair distance: 50    Wheelchair 50 feet with 2 turns activity  Assist        Assist Level: Supervision/Verbal cueing   Wheelchair 150 feet activity     Assist      Assist Level: Maximal Assistance - Patient 25 - 49%   Blood pressure (!) 117/59, pulse 76, temperature 97.9 F (36.6 C), temperature source Oral, resp. rate 16, height 5' 8.5 (1.74 m), weight 118.5 kg, SpO2 96%.  Medical Problem List and Plan: 1. Functional deficits secondary to debility from sepsis 2/2 bowel  perforation with Hartman's colostomy 9/28 by Dr. 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 Advised pt on team conference and evals, how d/c planning works.   -ok for dc 10/19.  Pt on entresto  and carvedilol  at home which have been held while here. Pt asked if she should resume.  She's still on midodrine to prop up her BP. Would recommend that she continue to hold these meds until she follows up with PCP and cards as outpt.   2.  Antithrombotics: -DVT/anticoagulation:  Pharmaceutical: Lovenox  40mg  daily             -antiplatelet therapy: N/A 3. Pain Management: Still using IV fentanyl  only for wound vac changes --continue tylenol  650mg  qid scheduled+PRN, Robaxin 1000 mg QID and oxycodone  prn, lidoderm  patches. Also on lyrica 50mg  BID -10/15 patient reports pain is overall controlled with oxycodone , she tries not to use this very frequently.  Continue current regimen 4. Mood/Behavior/Sleep: LCSW to follow for evaluation and support             --Trazodone prn  -takes Xanax  at home (states it's 0.5-1mg  TID PRN but always takes 1mg )-- also uses for sleep.  -10/21/23 updated xanax  indication for sleep if trazodone ineffective, discussed trying trazodone for sleep. Did NOT increase xanax  to avoid oversedation, and discussed that with pt.  -10/22/23 appears he slept better with as needed trazodone, continue current regimen and monitor -10/17 using PRN trazodone for sleep             -antipsychotic agents: N/A 5. Neuropsych/cognition: This patient is capable of making decisions on her  own behalf. 6. Skin/Wound Care/ostomy LLQ: Wound VAC change twice a week by WOC             --continue to encourage intake.  -10/21/23 very anxious  about ostomy education-- might benefit to have WOC come an extra day per week to see her specifically about ostomy education.  -10/13 surgery PA Almarie Pringle okay with removal of drain per IR -10/14 called IR regarding drain removal -10/15 WOC possible change to wet to dry if wound becomes more shallow -10/16 discussed with WOC, will continue outpatient wound VAC.  Social work reports this is ordered. Call IR  today -10/17 Drain removed by IR--stable 7. Fluids/Electrolytes/Nutrition: monitor I&O and labs, continue vitamins and supplements              --Continue Ensure BID. Add Juven BID -10/21/23 Mg being checked daily, but has been WNL, 2.0 today. Will d/c daily checks, can check as needed.  8.  Abdominal abscess: Drain in place. Cultures negative. --per  [facility]  to continue Zosyn /Micafungin for 14 days with EOT 10/21/23 9.  Malnutrition: Po intake poor and TPN weaned off 10/10             --continue Ensure BID, juven 10. 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 Continue Incruse inhaler  - 10/13 discussed with surgery, hold methotrexate  and CellCept  for at least 4 weeks if possible unless urgent need 12.  T2DM: Hgb A1C- 4.6. Monitor BS. Was on Ozempic outpatient -10/21/23 pt states she's been on ozempic and her diabetes was under great control, obviously off of that now-- will add on CBG checks daily in the AM to monitor, if noting rising fasting sugars, might need to check more often. Caution with supplements being carb heavy -10/16-18 AM Cbg  stable CBG (last 3)  Recent Labs    10/25/23 0604 10/26/23 0622 10/27/23 0459  GLUCAP 88 82 85    13. Obesity: Educate on diet and exercise to help with mobility and overall health 14. H/o Depression: On prozac 40mg  daily and Wellbutrin  450mg  daily.  15. Bowel management: hx of profound chronic constipation, now with colostomy. Continue Colace 100mg  BID, miralax  daily. Monitor ostomy output.  16. ABLA/thrombocytosis: Hgb 8's, monitor routinely. Plt 417 likely reactive.   - 9/13 hemoglobin stable at 8.8/platelets 484K, continue to monitor  - 9/16 hemoglobin stable at 9, platelets improved to 3 93K 17.  Pneumonia  - Start as needed Delsym, patient reports she does not tolerate guaifenesin  - 10/14 start antibiotics for left lower lobe airspace opacity, discussed with pharmacy Augmentin  for 5 days.  -10/15-16 symptoms improved, continue current  18. Small bowel obstruction noted on imaging. Noted incidentally on CT. Has been seen by surgery. Full liquid diet. Has been tolerating diet overall. Only small amount of output in her ostomy bag.  Xray small bowl protocol appears to have been ordered by surgical team  -10/18 pt is moving her bowels and overall feels better.    -surgery advanced to regular diet   -observe today   -can go home tomorrow.   LOS: 7 days A FACE TO FACE EVALUATION WAS PERFORMED  [name] 10/27/2023, 11:45 AM

## 2023-10-27 NOTE — Progress Notes (Signed)
 [facility] Progress Note     Subjective: CC:  Doing better. Having nonbloody loose stool per ostomy  Objective: Vital signs in last 24 hours: Temp:  [97.9 F (36.6 C)-98.9 F (37.2 C)] 97.9 F (36.6 C) (10/18 0302) Pulse Rate:  [75-79] 76 (10/18 0302) Resp:  [16-18] 16 (10/18 0302) BP: (117-131)/(59-61) 117/59 (10/18 0302) SpO2:  [96 %-97 %] 96 % (10/18 0302) Weight:  [118.5 kg] 118.5 kg (10/18 0444) Last BM Date :  (colostomy)  Intake/Output from previous day: 10/17 0701 - 10/18 0700 In: 240 [P.O.:240] Out: 2500 [Stool:2500] Intake/Output this shift: Total I/O In: 240 [P.O.:240] Out: -   PE: GEN: alert NAD Pulm: normal effort CV: RRR Abd: soft, appropriately tender, NPWT holding suction, ostomy pouch with liquid brown stool and gas  Lab Results:  Recent Labs    10/25/23 0545 10/26/23 0849  WBC 6.8 7.7  HGB 9.0* 10.0*  HCT 29.1* 32.4*  PLT 393 388   BMET Recent Labs    10/25/23 0545 10/26/23 0623  NA 139 137  K 3.9 4.2  CL 105 105  CO2 25 18*  GLUCOSE 90 85  BUN 12 13  CREATININE 0.89 1.01*  CALCIUM  8.7* 8.4*   PT/INR No results for input(s): LABPROT, INR in the last 72 hours. CMP     Component Value Date/Time   NA 137 10/26/2023 0623   NA 139 12/24/2017 1641   K 4.2 10/26/2023 0623   CL 105 10/26/2023 0623   CO2 18 (L) 10/26/2023 0623   GLUCOSE 85 10/26/2023 0623   BUN 13 10/26/2023 0623   BUN 15 12/24/2017 1641   CREATININE 1.01 (H) 10/26/2023 0623   CALCIUM  8.4 (L) 10/26/2023 0623   PROT 6.3 (L) 10/26/2023 0623   ALBUMIN  2.1 (L) 10/26/2023 0623   AST 16 10/26/2023 0623   ALT 14 10/26/2023 0623   ALKPHOS 129 (H) 10/26/2023 0623   BILITOT 0.3 10/26/2023 0623   GFRNONAA >60 10/26/2023 0623   GFRAA >60 06/30/2019 0221   Lipase     Component Value Date/Time   LIPASE 14 10/07/2023 1132       Studies/Results: DG Abd Portable 1V-Small Bowel Obstruction Protocol-initial, 8 hr delay Result Date:  10/27/2023 EXAM: 1 VIEW XRAY OF THE ABDOMEN 10/26/2023 06:33:00 PM COMPARISON: None available. CLINICAL HISTORY: SOB FINDINGS: LINES, TUBES AND DEVICES: Left lower quadrant ostomy noted. BOWEL: Nonobstructive bowel gas pattern. SOFT TISSUES: No opaque urinary calculi. BONES: No acute osseous abnormality. PLEURAL SPACES: Small left pleural effusion. IMPRESSION: 1. No acute radiographic abnormality in the abdomen or pelvis. 2. Small left pleural effusion. Electronically signed by: Norman Gatlin MD 10/27/2023 01:49 AM EDT RP Workstation: [id number]   IR INJECT INDWELLING DRAINAGE CATHETER Result Date: 10/26/2023 CLINICAL DATA:  Left lower quadrant drainage catheter has diminished output and CT findings suggest resolution of fluid cavity. Sinogram with evaluation for removal. FLUOROSCOPY: Radiation Exposure Index (as provided by the fluoroscopic device): 8.3 mGy Kerma PROCEDURE: The procedure, risks, benefits, and alternatives were explained to the patient. Questions regarding the procedure were encouraged and answered. The patient understands and consents to the procedure. In a supine position, the left lower quadrant drainage catheter was injected with dilute contrast. Contrast flows easily into the catheter and pools at the catheter tip. Small volume of contrast flows away from the catheter, but no evidence of fistula. No significant fluid collection. Retention suture and drainage catheter were cut. The drainage catheter was completely removed from the patient. Sterile dressing was applied. COMPLICATIONS:  None IMPRESSION: Resolution of fluid collection without evidence of fistula. Drainage catheter removed. Electronically Signed   By: Cordella Banner   On: 10/26/2023 08:52   CT ABDOMEN PELVIS W CONTRAST Result Date: 10/25/2023 EXAM: CT ABDOMEN AND PELVIS WITH CONTRAST 10/25/2023 08:29:30 PM TECHNIQUE: CT of the abdomen and pelvis was performed with the administration of 75 mL of iohexol  (OMNIPAQUE ) 350  MG/ML intravenous contrast. Multiplanar reformatted images are provided for review. Automated exposure control, iterative reconstruction, and/or weight-based adjustment of the mA/kV was utilized to reduce the radiation dose to as low as reasonably achievable. COMPARISON: CT with intravenous contrast 10/15/2023 and 10/07/2023. CLINICAL HISTORY: Evaluation of fluid collection s/p drain placement for possible drain removal. FINDINGS: LOWER CHEST: The cardiac size is normal. There is minimal pericardial effusion. There is a moderate-sized left pleural effusion, increased. There is a trace right pleural effusion, interval decrease. There are calcified granulomas in the compressed left lower lobe tissue alongside the pleural effusion. The lung bases are otherwise clear. No basilar  pneumothorax. LIVER: The liver is mildly steatotic with focal fat in segment 4b without mass. GALLBLADDER AND BILE DUCTS: There are a few noncalcified stones in the gallbladder, better demonstrated previously, without wall thickening or biliary dilatation. SPLEEN: The spleen is unremarkable. PANCREAS: The pancreas is partially atrophic and otherwise unremarkable. ADRENAL GLANDS: The adrenal glands are unremarkable. KIDNEYS, URETERS AND BLADDER: Right kidney asymmetrically small with scattered cortical scarring measuring 8.6 cm in length. There are a few scar-like cortical defects in the left kidney. There is no renal mass, stone, or hydronephrosis. No perinephric or periureteral stranding. Urinary bladder is unremarkable. GI AND BOWEL: Interval worsening of now high grade small bowel obstruction with increased fluid dilatation of the stomach, duodenum, and left abdominal small bowel. The obstructive point is in the left lower abdominopelvic quadrant medial to a newly noted pigtail drainage catheter, and the level of obstruction is probably mid ileal, with collapsed segments in the right hemiabdomen and maximum small bowel caliber up to 5 cm on  the left. The obstructive etiology is probably inflammatory adhesions related to now resolved fluid collections at this level. The appendix is not seen in this patient. 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 The large bowel wall is normal in thickness. PERITONEUM AND RETROPERITONEUM: Left adnexal/pelvic pigtail drainage catheter on series 2 axial images 68-70 is in the location of the largest of three pelvic collections previously seen at this level on 10/15/2023. All 3 of the prior pelvic collections have resolved. There previously was a small collection anterior to the distal right common iliac artery which has also resolved. There previously was a small collection slightly to the left of the aortic bifurcation which has also resolved. There is patchy mesenteric edema in the mid to lower abdomen which could be congestive, third spacing related or inflammatory. Minimal deep pelvic ascites appears similar. The mesenteric edema, although still present, is less than previously. No free air. VASCULATURE: Aorta is normal in caliber. Mild aortic atherosclerosis without aneurysm. LYMPH NODES: No adenopathy is seen. REPRODUCTIVE ORGANS: No acute abnormality. BONES AND SOFT TISSUES: There are degenerative changes of the lower lumbar spine, lower thoracic spine. Metallic anchors in the pubic bone. Acquired spinal stenosis L3-L4 and L4-L5. Body wall edema is again noted in the flanks and upper thighs, slightly improved and most likely due to fluid overload related third spacing. No acute osseous abnormality. No focal  soft tissue abnormality. IMPRESSION: 1. Interval worsening of now high-grade, approximately mid ileal small bowel obstruction with transition point in the left lower abdominopelvic quadrant medial 2 a pigtail drainage catheter, likely inflammatory/adhesive in etiology due to resolved adjacent fluid  collections. 2. All previously noted pelvic fluid collections have resolved. 3. Moderate-sized left pleural effusion, increased. 4. Trace right pleural effusion, decreased. 5. Mesenteric edema in the mid to lower abdomen, decreased but persistent, possibly related to congestion, third spacing, or inflammation. 6. Remaining findings described above. 7. These results will be telephoned to the referring provider or the referring providers representative by professional radiology assistant [facility]) personnel, with communication documented in the [facility] dashboard. Electronically signed by: Francis Quam MD 10/25/2023 09:24 PM EDT RP Workstation: [id number]    Anti-infectives: Anti-infectives (From admission, onward)    Start     Dose/Rate Route Frequency Ordered Stop   10/23/23 1345  amoxicillin -clavulanate (AUGMENTIN ) 875-125 MG per tablet 1 tablet        1 tablet Oral Every 12 hours 10/23/23 1249 10/28/23 0759   10/20/23 2200  micafungin (MYCAMINE) 150 mg in sodium chloride  0.9 % 100 mL IVPB        150 mg 107.5 mL/hr over 1 Hours Intravenous Every 24 hours 10/20/23 1332 10/21/23 2300   10/20/23 1430  piperacillin -tazobactam (ZOSYN ) IVPB 3.375 g        3.375 g 12.5 mL/hr over 240 Minutes Intravenous Every 8 hours 10/20/23 1332 10/22/23 0308        Assessment/Plan  Sigmoid bowel perforation with feculent peritonitis, likely due to stercoral colitis vs diverticulitis POD#20 s/p  LAPAROTOMY, EXPLORATORY, Sigmoid colon resection, Hartman's colostomy 9.28 Dr. Rubin - incidental finding of SBO on imaging two days ago for perc drain removal. SBO protcol >> passed contrast and having a lot of stool now. Ok for reg diet. Ok for discharge from [facility] standpoint. Follow up provided.   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 Nephrologist: [name]   LOS: 7 days   I reviewed nursing notes, hospitalist notes, last 24 h vitals and pain scores, last 48 h intake and output, last 24 h labs and trends, and last 24 h imaging results.  This care required moderate level of medical decision making.   [name] Pringle, PA-C [facility] Please see Amion for pager number during day hours 7:00am-4:30pm

## 2023-10-27 NOTE — Progress Notes (Incomplete)
 Inpatient Rehabilitation Discharge Medication Review by a Pharmacist  A complete drug regimen review was completed for this patient to identify any potential clinically significant medication issues.  High Risk Drug Classes Is patient taking? Indication by Medication  Antipsychotic No   Anticoagulant No   Antibiotic No   Opioid Yes Oxycodone  - pain  Antiplatelet No   Hypoglycemics/insulin  No   Vasoactive Medication Yes Midodrine - hypotension Nitroglycerin  PRN- chest pain   Chemotherapy No   Other Yes Bupropion , Fluoxetine - mood Robaxin- muscle spasms   Pregabalin - nerve pain Spiriva  respimat - ILD Acetaminophen - pain  Trazodone -insomnia Alprazolam  - anxiety Docusate, PEG- constipation  Ergocalciferol, docusate, MVI, folic acid , VitC - supplements  Lidocaine  patch - pain Ondansetron  - N/V Pantoprazole  - GERD      Type of Medication Issue Identified Description of Issue Recommendation(s)  Drug Interaction(s) (clinically significant)     Duplicate Therapy     Allergy     No Medication Administration End Date     Incorrect Dose     Additional Drug Therapy Needed     Significant med changes from prior encounter (inform family/care partners about these prior to discharge). PTA cellcept  and methotrexate  for hx ANA alveolar hemorrhage remain on hold d/t infection- F/U to resume   STOP: Coreg , Entresto , Ozempic, VitA    Communicate relevant medication changes to patient/family members at discharge from CIR.   Restart or discontinue PTA meds not resumed in CIR at discharge if clinically indicated.   Other       Clinically significant medication issues were identified that warrant physician communication and completion of prescribed/recommended actions by midnight of the next day:  No  Name of provider notified for urgent issues identified:   Provider Method of Notification:     Pharmacist comments:   Time spent performing this drug regimen review (minutes):   20

## 2023-10-27 NOTE — Plan of Care (Signed)
 Patient is staying until Sunday

## 2023-10-28 LAB — GLUCOSE, CAPILLARY: Glucose-Capillary: 91 mg/dL (ref 70–99)

## 2023-10-28 NOTE — Progress Notes (Signed)
 Inpatient Rehabilitation Discharge Medication Review by a Pharmacist  A complete drug regimen review was completed for this patient to identify any potential clinically significant medication issues.  High Risk Drug Classes Is patient taking? Indication by Medication  Antipsychotic No   Anticoagulant No   Antibiotic No   Opioid Yes Oxycodone  - pain  Antiplatelet No   Hypoglycemics/insulin  No   Vasoactive Medication Yes Midodrine - hypotension Nitroglycerin  PRN- chest pain   Chemotherapy No   Other Yes Bupropion , Fluoxetine - mood Robaxin- muscle spasms   Pregabalin - nerve pain Spiriva  respimat - ILD Acetaminophen - pain  Alprazolam  - anxiety Docusate, PEG- constipation  Ergocalciferol, MVI, folic acid , VitC - supplements  Lidocaine  patch - pain Ondansetron  - N/V Pantoprazole  - GERD      Type of Medication Issue Identified Description of Issue Recommendation(s)  Drug Interaction(s) (clinically significant)     Duplicate Therapy     Allergy     No Medication Administration End Date     Incorrect Dose     Additional Drug Therapy Needed     Significant med changes from prior encounter (inform family/care partners about these prior to discharge). PTA cellcept  and methotrexate  for hx ANA alveolar hemorrhage remain on hold d/t infection- F/U to resume   STOP: Coreg  (low BP), Entresto  (low BP), Ozempic, VitA    Communicate relevant medication changes to patient/family members at discharge from CIR.     Other       Clinically significant medication issues were identified that warrant physician communication and completion of prescribed/recommended actions by midnight of the next day:  No  Name of provider notified for urgent issues identified: N/A  Provider Method of Notification: N/A    Pharmacist comments:   Time spent performing this drug regimen review (minutes):  20   Alfonsa Vaile, Pharm.D., BCPS Clinical Pharmacist  **Pharmacist phone directory  can be found on [email or web address] listed under [facility].  10/28/2023 10:07 AM

## 2023-10-28 NOTE — Progress Notes (Signed)
 PROGRESS NOTE   Subjective/Complaints: Pt without new problems. Has a little gas in ostomy bag. Ate fairly well yesterday without indigestion or pain.   ROS: Patient denies fever, rash, sore throat, blurred vision, dizziness, nausea, vomiting, diarrhea, cough, shortness of breath or chest pain, joint or back/neck pain, headache, or mood change.   Objective:   DG Abd Portable 1V-Small Bowel Obstruction Protocol-initial, 8 hr delay Result Date: 10/27/2023 EXAM: 1 VIEW XRAY OF THE ABDOMEN 10/26/2023 06:33:00 PM COMPARISON: None available. CLINICAL HISTORY: SOB FINDINGS: LINES, TUBES AND DEVICES: Left lower quadrant ostomy noted. BOWEL: Nonobstructive bowel gas pattern. SOFT TISSUES: No opaque urinary calculi. BONES: No acute osseous abnormality. PLEURAL SPACES: Small left pleural effusion. IMPRESSION: 1. No acute radiographic abnormality in the abdomen or pelvis. 2. Small left pleural effusion. Electronically signed by: Norman Gatlin MD 10/27/2023 01:49 AM EDT RP Workstation: [id number]    Recent Labs    10/26/23 0849  WBC 7.7  HGB 10.0*  HCT 32.4*  PLT 388   Recent Labs    10/26/23 0623  NA 137  K 4.2  CL 105  CO2 18*  GLUCOSE 85  BUN 13  CREATININE 1.01*  CALCIUM  8.4*        Intake/Output Summary (Last 24 hours) at 10/28/2023 0927 Last data filed at 10/28/2023 0736 Gross per 24 hour  Intake 600 ml  Output 700 ml  Net -100 ml        Physical Exam: Vital Signs Blood pressure (!) 104/55, pulse 73, temperature 97.9 F (36.6 C), temperature source Oral, resp. rate 15, height 5' 8.5 (1.74 m), weight 118.2 kg, SpO2 96%.  Constitutional: No distress . Vital signs reviewed. HEENT: NCAT, EOMI, oral membranes moist Neck: supple Cardiovascular: RRR without murmur. No JVD    Respiratory/Chest: CTA Bilaterally without wheezes or rales. Normal effort    GI/Abdomen: BS +, sl tender. Ostomy back sealed with sl gas,  loose stool Ext: no clubbing, cyanosis, or edema Psych: pleasant and cooperative  Skin: No evidence of breakdown, no evidence of rash over exposed surfaces aside from aforementioned wounds. Vac sealed with serosang drainage in can  Neurologic: Cranial nerves II through XII intact, motor strength is 4/5 in bilateral deltoid, bicep, tricep, grip, 4- / 5hip flexor, knee extensors, ankle dorsiflexor and plantar flexor Sensory exam normal sensation to light touch and proprioception in bilateral upper and lower extremities    Prior neuro assessment is c/w today's exam 10/28/2023.    Musculoskeletal: Full range of motion in all 4 extremities. No joint swelling  PICC line right upper extremity  Assessment/Plan: 1. Functional deficits which require 3+ hours per day of interdisciplinary therapy in a comprehensive inpatient rehab setting. Physiatrist is providing close team supervision and 24 hour management of active medical problems listed below. Physiatrist and rehab team continue to assess barriers to discharge/monitor patient progress toward functional and medical goals  Care Tool:  Bathing    Body parts bathed by patient: Right arm, Left arm, Abdomen, Chest, Front perineal area, Buttocks, Right upper leg, Left upper leg, Face, Right lower leg, Left lower leg   Body parts bathed by helper: Right lower leg, Left lower leg  Bathing assist Assist Level: Independent with assistive device     Upper Body Dressing/Undressing Upper body dressing   What is the patient wearing?: Pull over shirt    Upper body assist Assist Level: Independent with assistive device    Lower Body Dressing/Undressing Lower body dressing      What is the patient wearing?: Pants, Underwear/pull up     Lower body assist Assist for lower body dressing: Independent with assitive device     Toileting Toileting    Toileting assist Assist for toileting: Independent with assistive device      Transfers Chair/bed transfer  Transfers assist     Chair/bed transfer assist level: Independent with assistive device Chair/bed transfer assistive device: Geologist, engineering   Ambulation assist      Assist level: Independent with assistive device Assistive device: Walker-rolling Max distance: 176   Walk 10 feet activity   Assist     Assist level: Independent with assistive device Assistive device: Walker-rolling   Walk 50 feet activity   Assist    Assist level: Independent with assistive device Assistive device: Walker-rolling    Walk 150 feet activity   Assist Walk 150 feet activity did not occur: Safety/medical concerns (fatigue/SOB)  Assist level: Independent with assistive device Assistive device: Walker-rolling    Walk 10 feet on uneven surface  activity   Assist Walk 10 feet on uneven surfaces activity did not occur: Safety/medical concerns (fatigue)   Assist level: Supervision/Verbal cueing Assistive device: Walker-rolling   Wheelchair     Assist Is the patient using a wheelchair?: No Type of Wheelchair: Manual    Wheelchair assist level: Supervision/Verbal cueing Max wheelchair distance: 50    Wheelchair 50 feet with 2 turns activity    Assist        Assist Level: Supervision/Verbal cueing   Wheelchair 150 feet activity     Assist      Assist Level: Maximal Assistance - Patient 25 - 49%   Blood pressure (!) 104/55, pulse 73, temperature 97.9 F (36.6 C), temperature source Oral, resp. rate 15, height 5' 8.5 (1.74 m), weight 118.2 kg, SpO2 96%.  Medical Problem List and Plan: 1. 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 Advised pt on team conference and evals, how d/c planning works.   -DC home today. As per yesterday, pt on entresto  and carvedilol  at home which have been held while here. Pt asked if she should resume.  She's still on midodrine to prop up her BP. Would recommend that she continue to hold these meds until she follows up with PCP and cards as outpt.   -nursing education, prep re: ostomy/vac prior to dc  -f/u with surgery, cards, primary  2.  Antithrombotics: -DVT/anticoagulation:  Pharmaceutical: Lovenox  40mg  daily             -antiplatelet therapy: N/A 3. Pain Management: Still using IV fentanyl  only for wound vac changes --continue tylenol  650mg  qid scheduled+PRN, Robaxin 1000 mg QID and oxycodone  prn, lidoderm  patches. Also on lyrica 50mg  BID -10/15 patient reports pain is overall controlled with oxycodone , she tries not to use  this very frequently.  Continue current regimen 4. Mood/Behavior/Sleep: LCSW to follow for evaluation and support             --Trazodone prn  -takes Xanax  at home (states it's 0.5-1mg  TID PRN but always takes 1mg )-- also uses for sleep.  -10/21/23 updated xanax  indication for sleep if trazodone ineffective, discussed trying trazodone for sleep. Did NOT increase xanax  to avoid oversedation, and discussed that with pt.  -10/22/23 appears he slept better with as needed trazodone, continue current regimen and monitor -10/17 using PRN trazodone for sleep             -antipsychotic agents: N/A 5. Neuropsych/cognition: This patient is capable of making decisions on her  own behalf. 6. Skin/Wound Care/ostomy LLQ: Wound VAC change twice a week by WOC             --continue to encourage intake.  -10/21/23 very anxious about ostomy education-- might benefit to have WOC come an extra day per week to see her specifically about ostomy education.  -10/13 surgery PA Almarie Pringle okay with removal of drain per IR -10/14 called IR regarding drain  removal -10/15 WOC possible change to wet to dry if wound becomes more shallow -10/16 discussed with WOC, will continue outpatient wound VAC.  Social work reports this is ordered. Call IR  today -10/17 Drain removed by IR--stable 10/19 wound/ostomy education again today before dc 7. Fluids/Electrolytes/Nutrition: monitor I&O and labs, continue vitamins and supplements              --Continue Ensure BID. Add Juven BID -10/21/23 Mg being checked daily, but has been WNL, 2.0 today. Will d/c daily checks, can check as needed.  8.  Abdominal abscess: Drain in place. Cultures negative. --per  [facility] to continue Zosyn /Micafungin for 14 days with EOT 10/21/23 9.  Malnutrition: Po intake poor and TPN weaned off 10/10             --continue Ensure BID, juven 10. Hypotension: Continue midodrine 10 mg TID  -10/13-19 BP overall stable continue to monitor     Vitals:   10/24/23 1332 10/24/23 2121 10/25/23 0550 10/25/23 1512  BP: 113/62 134/64 (!) 102/55 122/62   10/25/23 2203 10/26/23 0512 10/26/23 1808 10/26/23 2104  BP: 122/75 105/62 131/61 (!) 118/59   10/27/23 0302 10/27/23 1443 10/27/23 1945 10/28/23 0503  BP: (!) 117/59 (!) 122/55 107/61 (!) 104/55    11.  ANCA associated vasculitis/ILD:  Methotrexate  and Cellcept  on hold. Continue Incruse inhaler  - 10/13 discussed with surgery, hold methotrexate  and CellCept  for at least 4 weeks if possible unless urgent need 12.  T2DM: Hgb A1C- 4.6. Monitor BS. Was on Ozempic outpatient -10/21/23 pt states she's been on ozempic and her diabetes was under great control, obviously off of that now-- will add on CBG checks daily in the AM to monitor, if noting rising fasting sugars, might need to check more often. Caution with supplements being carb heavy -10/16-19 AM Cbg stable CBG (last 3)  Recent Labs    10/26/23 0622 10/27/23 0459 10/28/23 0503  GLUCAP 82 85 91    13. Obesity: Educate on diet and exercise to help with mobility and overall health 14. H/o  Depression: On prozac 40mg  daily and Wellbutrin  450mg  daily.  15. Bowel management: hx of profound chronic constipation, now with colostomy. Continue Colace 100mg  BID, miralax  daily. Monitor ostomy output.  16. ABLA/thrombocytosis: Hgb 8's, monitor routinely. Plt 417 likely reactive.   - 9/13 hemoglobin stable  at 8.8/platelets 484K, continue to monitor  - 9/16 hemoglobin stable at 9, platelets improved to 3 93K 17.  Pneumonia  - Start as needed Delsym, patient reports she does not tolerate guaifenesin  - 10/14 start antibiotics for left lower lobe airspace opacity, discussed with pharmacy Augmentin  for 5 days.  -10/15-16 symptoms improved, continue current  18. Small bowel obstruction noted on imaging. Noted incidentally on CT. Has been seen by surgery. Full liquid diet. Has been tolerating diet overall. Only small amount of output in her ostomy bag.  Xray small bowl protocol appears to have been ordered by surgical team  -10/18-19 pt is moving her bowels and overall feels better.    -surgery advanced to regular diet yesterday   -no issues with diet yesterday -f/u as outpt.   LOS: 8 days A FACE TO FACE EVALUATION WAS PERFORMED  [name] 10/28/2023, 9:27 AM

## 2023-10-28 NOTE — Plan of Care (Signed)
  Problem: RH BLADDER ELIMINATION Goal: RH STG MANAGE BLADDER WITH ASSISTANCE Description: STG Manage Bladder With Assistance Outcome: Adequate for Discharge

## 2023-10-28 NOTE — Progress Notes (Signed)
 Pt education providing wound V.A.C: how to return unit, change canister, connect/disconnect tubing and contacts when experiencing issues. Pt verbalized understanding and demonstrated canister and tubing changes. She has no concerns at this time. Wound V.A.C currently on continuous therapy at 125 mmHg.

## 2023-10-30 DIAGNOSIS — R7989 Other specified abnormal findings of blood chemistry: Secondary | ICD-10-CM | POA: Insufficient documentation

## 2024-01-16 NOTE — Progress Notes (Signed)
 " Cardiology Office Note   Date:  01/17/2024  ID:  [name], DOB 01/25/1960, MRN [id number] PCP: Lynwood Laneta LELON DEVONNA  [facility] Providers Cardiologist:  Maude Emmer, MD   History of Present Illness [name] is a 64 y.o. female with a past medical history of NICM status post chemo induced, hypertension, morbid obesity, aortic atherosclerosis, type 2 diabetes mellitus, obesity, anxiety, depression and ostomy care followed by Laneta Lynwood, PA-C for PCP here for hospital follow-up.  She has a chronic heart disease, acid reflux, vitamin D  deficiency, iron deficiency, back pain, mood disorder and diabetes here for follow-up.  Was last seen by Jackee Alberts on 06/28/2023.  At that time she reported ongoing stress with caring for her mother and experienced occasional episodes of asymptomatic hypotension.  Blood pressure was stable at 102/66.  Done well since her last visit and had lost a significant amount of weight.  Tolerating medications and Ozempic without adverse reactions.  Recently ran out of carvedilol  and Entresto  were taking both medications once daily.  Reported resolution of chest pain and was able to do various tasks around her home without recurrence.  Discussed at that time the indication for stress testing and the patient was aware that ischemic evaluation was not warranted at that time.  Denied chest pain, palpitations, dyspnea, PND, orthopnea, nausea, vomiting, dizziness, syncope, edema, weight gain, early satiety.  She was in the hospital back in October for a small bowel obstruction.  She had experienced 3 weeks of constipation found to have sigmoid colitis with bowel perforation and peritonitis.  Had exploratory lap on 11/06/2023 with sigmoid resection and Hartman's colostomy.  Treated with broad-spectrum antibiotics and started on TNA.  Hospital course was complicated by poor pain control, delirium, anxiety.  CT of the abdomen pelvis 10/15/2023 showed multiple pelvic  abscesses and IR drain was placed.  She was treated with Zosyn  and micafungin  for 2 weeks.  Hypotension was managed with midodrine .  She was sent to rehab 10/10/2023 for inpatient therapies with PT and OT for about 3 hours 5 days a week.  Blood pressure had been well-controlled.  Antibiotics were completed on 10/21/2023.  Increasing cough and concerns of left lower lobe pneumonia on chest x-ray 10/23/2023 so she was treated with a 5-day course of Augmentin .  Plan to continue PT, OT, and RN with [facility].  Has some hypertension and was previously on carvedilol  12.5 mg twice daily and Entresto  49-51 mg twice a day but both medications were held due to hypotension and she was started on midodrine .  She was taken off Ozempic during her hospital stay with concerns about her low blood sugars.  Sugars were well-controlled in the hospital ranging from 88-91.  Today, she presents with a hx of vasculitis and heart issues who presents for evaluation of her heart medication regimen.  She has vasculitis and cardiac disease and reports major weight loss from 385 to 260 pounds. She recalls a recent hospitalization that led directly to surgery but does not remember details due to heavy sedation.  She reports significant stress and anxiety related to caregiving for her mother, with severe insomnia, often sleeping less than four hours per night. She notes frequent cold hands and arms, which she attributes to poor blood flow.  Her heart medications are carvedilol  12.5 mg twice daily and Entresto  twice daily. She stopped metoprolol  because it caused severe nausea and chills. She is worried that her blood pressure is not monitored at  home due to lack of a device.  She has vasculitis previously treated with Rituxan , mycophenolate , methotrexate , and folic acid . These were stopped because of her cardiac condition. She is worried about being off immunosuppression and is awaiting further evaluation.  She recently  had abdominal surgery and now has two abdominal wounds and a colostomy bag. She reports significant discomfort and difficulty with daily activities.  She reports chest pain that she believes is musculoskeletal from holding her shirt up over the wounds. She also has shortness of breath and difficulty coughing, especially when lying down, which she associates with abdominal pressure from her wounds.  She endorses depression and feeling overwhelmed by her health and caregiving responsibilities. She has taken depression medication lifelong and feels frustrated with her current situation.  Reports no shortness of breath nor dyspnea on exertion. Reports no chest pain, pressure, or tightness. No edema, orthopnea, PND. Reports no palpitations.   Discussed the use of AI scribe software for clinical note transcription with the patient, who gave verbal consent to proceed.  ROS: Pertinent ROS in HPI  Studies Reviewed     Echocardiogram 10/19/2021 IMPRESSIONS     1. Left ventricular ejection fraction, by estimation, is 60 to 65%. The  left ventricle has normal function. The left ventricle has no regional  wall motion abnormalities. Left ventricular diastolic parameters were  normal.   2. Right ventricular systolic function is normal. The right ventricular  size is normal. There is normal pulmonary artery systolic pressure. The  estimated right ventricular systolic pressure is 18.8 mmHg.   3. The mitral valve is normal in structure. Trivial mitral valve  regurgitation. No evidence of mitral stenosis.   4. The aortic valve is tricuspid. Aortic valve regurgitation is not  visualized. No aortic stenosis is present.   5. The inferior vena cava is normal in size with greater than 50%  respiratory variability, suggesting right atrial pressure of 3 mmHg.       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 On carvedilol  and Entresto . Chest pain likely muscle strain. Entresto  effective but may lower blood pressure. Carvedilol  reduces heart workload. - Restarted carvedilol  at 6.25 mg BID. - Held Entresto  until blood pressure stabilizes. - Ordered echocardiogram to assess heart function. - Follow up with cardiologist in 3-4 months.  Primary hypertension, hx of hypotension Hypertension management complicated by surgery and medication changes. Blood pressure control crucial to prevent cardiac complications. - Adjusted carvedilol  to 6.25 mg BID.  ANCA-associated vasculitis Previously managed with Rituxan . Current management complicated by cardiac issues. Rituxan  effective but not resumed due to cardiac priorities. - Continue follow-up with rheumatologist to manage vasculitis and immune system control.    Dispo: She can return in a few months to see MD  Signed, Orren LOISE Fabry, PA-C   "

## 2024-01-17 ENCOUNTER — Encounter: Payer: Self-pay | Admitting: Physician Assistant

## 2024-01-17 ENCOUNTER — Ambulatory Visit: Attending: Physician Assistant | Admitting: Physician Assistant

## 2024-01-17 VITALS — BP 120/82 | HR 77 | Ht 69.0 in | Wt 261.0 lb

## 2024-01-17 DIAGNOSIS — I7782 Antineutrophilic cytoplasmic antibody (ANCA) vasculitis: Secondary | ICD-10-CM | POA: Diagnosis not present

## 2024-01-17 DIAGNOSIS — I7090 Unspecified atherosclerosis: Secondary | ICD-10-CM | POA: Diagnosis not present

## 2024-01-17 DIAGNOSIS — I428 Other cardiomyopathies: Secondary | ICD-10-CM | POA: Diagnosis not present

## 2024-01-17 DIAGNOSIS — I10 Essential (primary) hypertension: Secondary | ICD-10-CM | POA: Diagnosis not present

## 2024-01-17 DIAGNOSIS — E118 Type 2 diabetes mellitus with unspecified complications: Secondary | ICD-10-CM | POA: Diagnosis not present

## 2024-01-17 MED ORDER — CARVEDILOL 6.25 MG PO TABS: 6.2500 mg | ORAL_TABLET | Freq: Two times a day (BID) | ORAL | 1 refills | Status: DC

## 2024-01-17 NOTE — Patient Instructions (Addendum)
 Medication Instructions:  RESTART Carvedilol  (coreg ) 6.25mg  Take 1 tablet twice a day  *If you need a refill on your cardiac medications before your next appointment, please call your pharmacy*  Lab Work: None ordered If you have labs (blood work) drawn today and your tests are completely normal, you will receive your results only by: MyChart Message (if you have MyChart) OR A paper copy in the mail If you have any lab test that is abnormal or we need to change your treatment, we will call you to review the results.  Testing/Procedures: Your physician has requested that you have an echocardiogram. Echocardiography is a painless test that uses sound waves to create images of your heart. It provides your doctor with information about the size and shape of your heart and how well your hearts chambers and valves are working. This procedure takes approximately one hour. There are no restrictions for this procedure. Please do NOT wear cologne, perfume, aftershave, or lotions (deodorant is allowed). Please arrive 15 minutes prior to your appointment time.  Please note: We ask at that you not bring children with you during ultrasound (echo/ vascular) testing. Due to room size and safety concerns, children are not allowed in the ultrasound rooms during exams. Our front office staff cannot provide observation of children in our lobby area while testing is being conducted. An adult accompanying a patient to their appointment will only be allowed in the ultrasound room at the discretion of the ultrasound technician under special circumstances. We apologize for any inconvenience.  Follow-Up: At [facility], you and your health needs are our priority.  As part of our continuing mission to provide you with exceptional heart care, our providers are all part of one team.  This team includes your primary Cardiologist (physician) and Advanced Practice Providers or APPs (Physician Assistants and Nurse  Practitioners) who all work together to provide you with the care you need, when you need it.  Your next appointment:   3-4 month(s)  Provider:   Maude Emmer, MD    We recommend signing up for the patient portal called MyChart.  Sign up information is provided on this After Visit Summary.  MyChart is used to connect with patients for Virtual Visits (Telemedicine).  Patients are able to view lab/test results, encounter notes, upcoming appointments, etc.  Non-urgent messages can be sent to your provider as well.   To learn more about what you can do with MyChart, go to [email or web address].   Other Instructions

## 2024-01-30 ENCOUNTER — Encounter (HOSPITAL_COMMUNITY): Payer: Self-pay | Admitting: Physician Assistant

## 2024-02-13 ENCOUNTER — Other Ambulatory Visit: Payer: Self-pay | Admitting: Physician Assistant
